# Patient Record
Sex: Female | Born: 1935
Health system: Southern US, Community
[De-identification: ages and names within clinical notes are randomized; demographics above are authoritative.]

## PROBLEM LIST (undated history)

## (undated) DIAGNOSIS — T8859XA Other complications of anesthesia, initial encounter: Secondary | ICD-10-CM

## (undated) DIAGNOSIS — D751 Secondary polycythemia: Secondary | ICD-10-CM

## (undated) DIAGNOSIS — E039 Hypothyroidism, unspecified: Secondary | ICD-10-CM

## (undated) DIAGNOSIS — F0390 Unspecified dementia without behavioral disturbance: Secondary | ICD-10-CM

## (undated) HISTORY — PX: BREAST SURGERY: SHX581

## (undated) HISTORY — PX: ABDOMINAL HYSTERECTOMY: SHX81

## (undated) HISTORY — DX: Secondary polycythemia: D75.1

---

## 2016-04-12 DIAGNOSIS — D45 Polycythemia vera: Secondary | ICD-10-CM | POA: Diagnosis not present

## 2016-04-26 DIAGNOSIS — D45 Polycythemia vera: Secondary | ICD-10-CM | POA: Diagnosis not present

## 2016-05-10 DIAGNOSIS — D45 Polycythemia vera: Secondary | ICD-10-CM | POA: Diagnosis not present

## 2016-05-20 DIAGNOSIS — D469 Myelodysplastic syndrome, unspecified: Secondary | ICD-10-CM | POA: Diagnosis not present

## 2016-05-20 DIAGNOSIS — D45 Polycythemia vera: Secondary | ICD-10-CM | POA: Diagnosis not present

## 2016-06-03 DIAGNOSIS — D45 Polycythemia vera: Secondary | ICD-10-CM | POA: Diagnosis not present

## 2016-06-03 DIAGNOSIS — D469 Myelodysplastic syndrome, unspecified: Secondary | ICD-10-CM | POA: Diagnosis not present

## 2016-07-01 DIAGNOSIS — D469 Myelodysplastic syndrome, unspecified: Secondary | ICD-10-CM | POA: Diagnosis not present

## 2016-07-01 DIAGNOSIS — D631 Anemia in chronic kidney disease: Secondary | ICD-10-CM | POA: Diagnosis not present

## 2016-07-01 DIAGNOSIS — M810 Age-related osteoporosis without current pathological fracture: Secondary | ICD-10-CM | POA: Diagnosis not present

## 2016-07-01 DIAGNOSIS — D509 Iron deficiency anemia, unspecified: Secondary | ICD-10-CM | POA: Diagnosis not present

## 2016-07-01 DIAGNOSIS — D471 Chronic myeloproliferative disease: Secondary | ICD-10-CM | POA: Diagnosis not present

## 2016-07-01 DIAGNOSIS — D709 Neutropenia, unspecified: Secondary | ICD-10-CM | POA: Diagnosis not present

## 2016-07-01 DIAGNOSIS — R4182 Altered mental status, unspecified: Secondary | ICD-10-CM | POA: Diagnosis not present

## 2016-07-01 DIAGNOSIS — D45 Polycythemia vera: Secondary | ICD-10-CM | POA: Diagnosis not present

## 2016-07-01 DIAGNOSIS — E860 Dehydration: Secondary | ICD-10-CM | POA: Diagnosis not present

## 2016-07-30 DIAGNOSIS — D45 Polycythemia vera: Secondary | ICD-10-CM | POA: Diagnosis not present

## 2016-08-30 DIAGNOSIS — D0462 Carcinoma in situ of skin of left upper limb, including shoulder: Secondary | ICD-10-CM | POA: Diagnosis not present

## 2016-08-30 DIAGNOSIS — D485 Neoplasm of uncertain behavior of skin: Secondary | ICD-10-CM | POA: Diagnosis not present

## 2016-08-30 DIAGNOSIS — L570 Actinic keratosis: Secondary | ICD-10-CM | POA: Diagnosis not present

## 2016-08-30 DIAGNOSIS — D0439 Carcinoma in situ of skin of other parts of face: Secondary | ICD-10-CM | POA: Diagnosis not present

## 2016-08-30 DIAGNOSIS — L578 Other skin changes due to chronic exposure to nonionizing radiation: Secondary | ICD-10-CM | POA: Diagnosis not present

## 2016-08-30 DIAGNOSIS — Z08 Encounter for follow-up examination after completed treatment for malignant neoplasm: Secondary | ICD-10-CM | POA: Diagnosis not present

## 2016-08-30 DIAGNOSIS — Z85828 Personal history of other malignant neoplasm of skin: Secondary | ICD-10-CM | POA: Diagnosis not present

## 2016-09-02 DIAGNOSIS — D45 Polycythemia vera: Secondary | ICD-10-CM | POA: Diagnosis not present

## 2016-10-07 DIAGNOSIS — D45 Polycythemia vera: Secondary | ICD-10-CM | POA: Diagnosis not present

## 2016-11-05 DIAGNOSIS — L570 Actinic keratosis: Secondary | ICD-10-CM | POA: Diagnosis not present

## 2016-11-05 DIAGNOSIS — D0439 Carcinoma in situ of skin of other parts of face: Secondary | ICD-10-CM | POA: Diagnosis not present

## 2016-11-05 DIAGNOSIS — D485 Neoplasm of uncertain behavior of skin: Secondary | ICD-10-CM | POA: Diagnosis not present

## 2016-11-05 DIAGNOSIS — D0462 Carcinoma in situ of skin of left upper limb, including shoulder: Secondary | ICD-10-CM | POA: Diagnosis not present

## 2016-12-23 DIAGNOSIS — D471 Chronic myeloproliferative disease: Secondary | ICD-10-CM | POA: Diagnosis not present

## 2016-12-23 DIAGNOSIS — D45 Polycythemia vera: Secondary | ICD-10-CM | POA: Diagnosis not present

## 2016-12-26 DIAGNOSIS — D45 Polycythemia vera: Secondary | ICD-10-CM | POA: Diagnosis not present

## 2017-01-09 DIAGNOSIS — H903 Sensorineural hearing loss, bilateral: Secondary | ICD-10-CM | POA: Diagnosis not present

## 2017-02-06 DIAGNOSIS — D45 Polycythemia vera: Secondary | ICD-10-CM | POA: Diagnosis not present

## 2017-05-20 ENCOUNTER — Telehealth: Payer: Self-pay | Admitting: Unknown Physician Specialty

## 2017-05-20 NOTE — Telephone Encounter (Signed)
I'm OK with this 

## 2017-05-20 NOTE — Telephone Encounter (Signed)
Laura [facility], please see other messages. I see we have some 30 minute openings this coming Monday afternoon, 05/26/17. But that would give [place] 3 new patients that day. Are you OK with that or do you see a day or time that we can fit the patient in?

## 2017-05-20 NOTE — Telephone Encounter (Signed)
Copied from [place]. Topic: Inquiry >> May 20, 2017  1:08 PM Neva Seat wrote: New Pt visit w/ Kathrine Haddock - 06-02-17   Pt is taking Agrylin (?) for a blood condition and will be running out of Rx before the New Pt visit.   Pt's Daughter Arrie Aran) is currently a pt of [facility].  She is requesting if possible, a specialist to be recommended and referred for pt to receive the above Rx.    Please call daughter at [phone] to discuss

## 2017-05-20 NOTE — Telephone Encounter (Signed)
Copied from [place]. >> May 20, 2017  1:08 PM Neva Seat wrote: New Pt visit w/ Kathrine Haddock - 06-02-17   Pt is taking Agrylin (?) for a blood condition and will be running out of Rx before the New Pt visit.   Pt's Daughter Arrie Aran) is currently a pt of [facility].  She is requesting if possible, a specialist to be recommended and referred for pt to receive the above Rx.    Please call daughter at [phone] to discuss

## 2017-05-21 NOTE — Telephone Encounter (Signed)
Called pt but VM was not set up.

## 2017-06-02 ENCOUNTER — Encounter: Payer: Self-pay | Admitting: Unknown Physician Specialty

## 2017-06-02 ENCOUNTER — Telehealth: Payer: Self-pay

## 2017-06-02 ENCOUNTER — Ambulatory Visit (INDEPENDENT_AMBULATORY_CARE_PROVIDER_SITE_OTHER): Payer: Medicare Other | Admitting: Unknown Physician Specialty

## 2017-06-02 VITALS — BP 86/65 | HR 105 | Temp 98.2°F | Ht 63.2 in | Wt 112.0 lb

## 2017-06-02 DIAGNOSIS — D45 Polycythemia vera: Secondary | ICD-10-CM

## 2017-06-02 DIAGNOSIS — R4181 Age-related cognitive decline: Secondary | ICD-10-CM | POA: Insufficient documentation

## 2017-06-02 DIAGNOSIS — R4189 Other symptoms and signs involving cognitive functions and awareness: Secondary | ICD-10-CM | POA: Insufficient documentation

## 2017-06-02 DIAGNOSIS — L509 Urticaria, unspecified: Secondary | ICD-10-CM | POA: Insufficient documentation

## 2017-06-02 DIAGNOSIS — I959 Hypotension, unspecified: Secondary | ICD-10-CM | POA: Diagnosis not present

## 2017-06-02 DIAGNOSIS — R636 Underweight: Secondary | ICD-10-CM | POA: Diagnosis not present

## 2017-06-02 DIAGNOSIS — Z7189 Other specified counseling: Secondary | ICD-10-CM

## 2017-06-02 DIAGNOSIS — E440 Moderate protein-calorie malnutrition: Secondary | ICD-10-CM | POA: Insufficient documentation

## 2017-06-02 DIAGNOSIS — G3184 Mild cognitive impairment, so stated: Secondary | ICD-10-CM | POA: Diagnosis not present

## 2017-06-02 DIAGNOSIS — Z7689 Persons encountering health services in other specified circumstances: Secondary | ICD-10-CM | POA: Diagnosis not present

## 2017-06-02 HISTORY — DX: Urticaria, unspecified: L50.9

## 2017-06-02 MED ORDER — ANAGRELIDE HCL 0.5 MG PO CAPS
ORAL_CAPSULE | ORAL | 0 refills | Status: DC
Start: 2017-06-02 — End: 2017-07-09

## 2017-06-02 MED ORDER — TRIAMCINOLONE ACETONIDE 0.1 % EX CREA: 1.0000 "application " | TOPICAL_CREAM | Freq: Two times a day (BID) | CUTANEOUS | 1 refills | Status: DC

## 2017-06-02 MED ORDER — ANAGRELIDE HCL 0.5 MG PO CAPS: ORAL_CAPSULE | ORAL | 0 refills | Status: DC

## 2017-06-02 NOTE — Assessment & Plan Note (Signed)
Seems to be improving.  Will follow next visit.  Cognitive testing at [facility]

## 2017-06-02 NOTE — Assessment & Plan Note (Signed)
From a side effect of her medication. Will recommend Aquaphor or Eucerin.  Use a steroid cream for very occasional use.

## 2017-06-02 NOTE — Telephone Encounter (Signed)
Pharmacy sent a fax needing the directions on patient's anagrelide changed as patient only take the medication Monday through Saturday. Current directions say Monday through Sunday. I typed it in wrong when I entered it into the patient;s chart. I tried to update it in the chart but both sets of directions are showing up. Can you change it to only take Monday through Saturday and resend to the pharmacy please?

## 2017-06-02 NOTE — Assessment & Plan Note (Addendum)
Refilled medication and made referral to hematology.  Will let them draw labs.

## 2017-06-02 NOTE — Assessment & Plan Note (Signed)
A voluntary discussion about advance care planning including the explanation and discussion of advance directives was extensively discussed  with the patient.  Explanation about the health care proxy and Living will was reviewed and packet with forms with explanation of how to fill them out was given.  During this discussion, the patient was able to identify a health care proxy plans to fill out the paperwork required.  Patient was offered a separate [place] visit for further assistance with forms.  Time spent:  20      Individuals present: daughter and patient.

## 2017-06-02 NOTE — Progress Notes (Signed)
BP (!) 86/65   Pulse (!) 105   Temp 98.2 F (36.8 C) (Oral)   Ht 5' 3.2" (1.605 m)   Wt 112 lb (50.8 kg)   SpO2 94%   BMI 19.71 kg/m    Subjective:    Patient ID: Laura [facility], female    DOB: May 05, 1926, 84 y.o.   MRN: [id number]  HPI: Laura Wilcox is a 84 y.o. female  Chief Complaint  Patient presents with  . Establish Care    pt's daughter states that the patient needs a referral to a hematologist or oncologist    She is living independently in her own apartment.  Here with her daughter who gives part of the history.    History is significant for polycythemia vera.  She is taking Anagrelide and was under the care of a hematologist.  She did have a health crisis in 2017 when this medication got changed.    Social History   Socioeconomic History  . Marital status: Widowed    Spouse name: Not on file  . Number of children: Not on file  . Years of education: Not on file  . Highest education level: Not on file  Social Needs  . Financial resource strain: Not on file  . Food insecurity - worry: Not on file  . Food insecurity - inability: Not on file  . Transportation needs - medical: Not on file  . Transportation needs - non-medical: Not on file  Occupational History  . Not on file  Tobacco Use  . Smoking status: Former Smoker    Last attempt to quit: 04/02/1978    Years since quitting: 39.1  . Smokeless tobacco: Never Used  Substance and Sexual Activity  . Alcohol use: No    Frequency: Never    Comment: occasional glass of wine  . Drug use: No  . Sexual activity: Not on file  Other Topics Concern  . Not on file  Social History Narrative  . Not on file   Family History  Problem Relation Age of Onset  . Heart disease Father   . Cancer Sister        breast  . Cancer Sister        breast  . Alzheimer's disease Sister    Past Medical History:  Diagnosis Date  . Polycythemia    Past Surgical History:  Procedure Laterality Date  . ABDOMINAL  HYSTERECTOMY     complete     Relevant past medical, surgical, family and social history reviewed and updated as indicated. Interim medical history since our last visit reviewed. Allergies and medications reviewed and updated.  Review of Systems  Constitutional: Negative.  Negative for unexpected weight change.  HENT: Negative.   Respiratory: Negative.   Cardiovascular: Negative.   Gastrointestinal: Negative.   Genitourinary: Positive for frequency.       Has to wear a pad much of the time  Skin: Negative for rash.       Dry and scaley.  Sde effect of medications and solar damage  Neurological:       Short term memory loss following health crisis in 2017.  Slowly improving  Hematological:       Polycythemia vera  Psychiatric/Behavioral: Negative.     Per HPI unless specifically indicated above     Objective:    BP (!) 86/65   Pulse (!) 105   Temp 98.2 F (36.8 C) (Oral)   Ht 5' 3.2" (1.605 m)   Wt 112 lb (  50.8 kg)   SpO2 94%   BMI 19.71 kg/m   Wt Readings from Last 3 Encounters:  06/02/17 112 lb (50.8 kg)    Physical Exam  Constitutional: She is oriented to person, place, and time. She appears well-developed and well-nourished. No distress.  HENT:  Head: Normocephalic and atraumatic.  Eyes: Conjunctivae and lids are normal. Right eye exhibits no discharge. Left eye exhibits no discharge. No scleral icterus.  Neck: Normal range of motion. Neck supple. No JVD present. Carotid bruit is not present.  Cardiovascular: Normal rate, regular rhythm and normal heart sounds.  Pulmonary/Chest: Effort normal and breath sounds normal.  Abdominal: Normal appearance. There is no splenomegaly or hepatomegaly.  Musculoskeletal: Normal range of motion.  Neurological: She is alert and oriented to person, place, and time.  Skin: Skin is warm, dry and intact. No rash noted. No pallor.  Psychiatric: She has a normal mood and affect. Her behavior is normal. Judgment and thought content  normal.    No results found for this or any previous visit.    Assessment & Plan:   Problem List Items Addressed This Visit      Unprioritized   Advanced care planning/counseling discussion    A voluntary discussion about advance care planning including the explanation and discussion of advance directives was extensively discussed  with the patient.  Explanation about the health care proxy and Living will was reviewed and packet with forms with explanation of how to fill them out was given.  During this discussion, the patient was able to identify a health care proxy plans to fill out the paperwork required.  Patient was offered a separate [place] visit for further assistance with forms.  Time spent:  20      Individuals present: daughter and patient.       Mild cognitive impairment    Seems to be improving.  Will follow next visit.  Cognitive testing at [facility]      Polycythemia vera ([place]) - Primary    Refilled medication and made referral to hematology.  Will let them draw labs.        Relevant Orders   Ambulatory referral to Hematology / Oncology   Underweight    Weight is stable.  She has always been thin      Urticaria    From a side effect of her medication. Will recommend Aquaphor or Eucerin.  Use a steroid cream for very occasional use.         Other Visit Diagnoses    Hypotension, unspecified hypotension type       Follow.  Normal for her   Encounter to establish care          Refusing bone density  Follow up plan: Return in about 6 months (around 12/03/2017) for wellness and physical.

## 2017-06-02 NOTE — Patient Instructions (Signed)
Creams or ointments: Udder cream, Aquaphor, Eucerin cream

## 2017-06-02 NOTE — Assessment & Plan Note (Signed)
Weight is stable.  She has always been thin

## 2017-06-06 ENCOUNTER — Telehealth: Payer: Self-pay

## 2017-06-06 NOTE — Telephone Encounter (Signed)
Called and spoke to [place] at the cancer center. She states that they will need patient's previous lab results to compare results to. Will call patient's daughter and see what we can do about getting records.

## 2017-06-06 NOTE — Telephone Encounter (Signed)
Called and spoke to patient's daughter. Explained to her what was going on. She states that they have paper copies of the patient's records at home and can bring them by here for [place] to look at. Will await previous records and fax requested information to the cancer center.

## 2017-06-06 NOTE — Telephone Encounter (Signed)
Patient just established care with [facility]. We do not have any labs on her. If they need [place] to, we can check where she was seen previously and try to get records, but I don't have anything for them right now.

## 2017-06-06 NOTE — Telephone Encounter (Signed)
Previous records dropped off by patient's family. Looked through records and faxed lab results to the [facility].

## 2017-06-06 NOTE — Telephone Encounter (Signed)
Colette called from cancer center. They need labs before scheduling patient. Want to know how polycythemia vera was d/x. Please advise.   Fax number for results: [phone]

## 2017-07-02 ENCOUNTER — Inpatient Hospital Stay: Payer: Medicare Other

## 2017-07-02 ENCOUNTER — Inpatient Hospital Stay: Payer: Medicare Other | Attending: Hematology and Oncology | Admitting: Hematology and Oncology

## 2017-07-02 ENCOUNTER — Encounter: Payer: Self-pay | Admitting: Hematology and Oncology

## 2017-07-02 VITALS — BP 122/56 | HR 60 | Temp 97.3°F | Resp 18 | Ht 65.0 in | Wt 110.0 lb

## 2017-07-02 DIAGNOSIS — D7581 Myelofibrosis: Secondary | ICD-10-CM | POA: Diagnosis not present

## 2017-07-02 DIAGNOSIS — D45 Polycythemia vera: Secondary | ICD-10-CM | POA: Insufficient documentation

## 2017-07-02 DIAGNOSIS — Z87891 Personal history of nicotine dependence: Secondary | ICD-10-CM | POA: Insufficient documentation

## 2017-07-02 DIAGNOSIS — R42 Dizziness and giddiness: Secondary | ICD-10-CM | POA: Diagnosis not present

## 2017-07-02 DIAGNOSIS — D473 Essential (hemorrhagic) thrombocythemia: Secondary | ICD-10-CM

## 2017-07-02 DIAGNOSIS — C946 Myelodysplastic disease, not classified: Secondary | ICD-10-CM | POA: Diagnosis not present

## 2017-07-02 LAB — CBC WITH DIFFERENTIAL/PLATELET
Basophils Absolute: 0.3 10*3/uL — ABNORMAL HIGH (ref 0–0.1)
Basophils Relative: 2 %
Eosinophils Absolute: 1.1 10*3/uL — ABNORMAL HIGH (ref 0–0.7)
Eosinophils Relative: 6 %
HCT: 46.9 % (ref 35.0–47.0)
Hemoglobin: 15.2 g/dL (ref 12.0–16.0)
LYMPHS PCT: 11 %
Lymphs Abs: 2.1 10*3/uL (ref 1.0–3.6)
MCH: 27.1 pg (ref 26.0–34.0)
MCHC: 32.4 g/dL (ref 32.0–36.0)
MCV: 83.6 fL (ref 80.0–100.0)
MONO ABS: 1 10*3/uL — AB (ref 0.2–0.9)
MONOS PCT: 5 %
NEUTROS ABS: 14.4 10*3/uL — AB (ref 1.4–6.5)
Neutrophils Relative %: 76 %
Platelets: 645 10*3/uL — ABNORMAL HIGH (ref 150–440)
RBC: 5.61 MIL/uL — ABNORMAL HIGH (ref 3.80–5.20)
RDW: 25.2 % — AB (ref 11.5–14.5)
WBC: 18.8 10*3/uL — ABNORMAL HIGH (ref 3.6–11.0)

## 2017-07-02 LAB — COMPREHENSIVE METABOLIC PANEL
ALT: 11 U/L — ABNORMAL LOW (ref 14–54)
ANION GAP: 9 (ref 5–15)
AST: 17 U/L (ref 15–41)
Albumin: 4.2 g/dL (ref 3.5–5.0)
Alkaline Phosphatase: 111 U/L (ref 38–126)
BILIRUBIN TOTAL: 0.4 mg/dL (ref 0.3–1.2)
BUN: 23 mg/dL — ABNORMAL HIGH (ref 6–20)
CO2: 28 mmol/L (ref 22–32)
Calcium: 9.3 mg/dL (ref 8.9–10.3)
Chloride: 101 mmol/L (ref 101–111)
Creatinine, Ser: 0.88 mg/dL (ref 0.44–1.00)
GFR calc Af Amer: >60 mL/min (ref >60)
GFR, EST NON AFRICAN AMERICAN: 57 mL/min — AB (ref >60)
Glucose, Bld: 91 mg/dL (ref 65–99)
POTASSIUM: 4.3 mmol/L (ref 3.5–5.1)
Sodium: 138 mmol/L (ref 135–145)
TOTAL PROTEIN: 7.3 g/dL (ref 6.5–8.1)

## 2017-07-02 NOTE — Progress Notes (Signed)
[facility] day:  07/02/2017  Chief Complaint: Laura Wilcox is a 84 y.o. female with polycythemia who is referred in consultation by Kathrine Haddock, NP for assessment and management.  HPI:  She has a history of polycythemia rubra vera.  She takes anagrelide.  She had a health crisis in 2017 when this medication was changed.  She previously received care at [facility] in [place], [place] [phone]).  Her oncologist was Dr Harley Hallmark.  Review of records from [place]: The patient presented with thrombocytosis on 04/12/2002.  CBC revealed a hematocrit of 39.8, hemoglobin 13.8, MCV 86, platelets 835,000, WBC 12,700 with a normal differential.  Bone marrow was on 04/15/2002 revealed a normocellular marrow for age with trilineage hematopoiesis and mild megakaryocytic hyperplasia with focal atypia.  There was no morphologic evidence of a myeloproliferative disorder.  Iron stains were inadequate for evaluation of storage iron.  There was no increase in reticulin fibers.  She has been on agrylin since 2012.  JAK2 V617F was positive on 05/20/2012.  Bone marrow on 09/25/2012 revealed persistent myeloproliferative disorder s/p Agrylin.  The current features were best classified as persistent myeloproliferative neoplasm (MPN) JAK2 V617F mutation positive.  The current features of markedly increased atypical megakaryocytes paresis, with frequent clustering with frequent hyper/deeply lobulated forms, bizarre forms and forms with staghorn nuclei, hyperchromatic forms and abundant cytoplasm was highly suggestive of a myeloproliferative neoplasm.  Marrow was hypercellular for age (30-50%) with increased trilineage hematopoiesis and markedly increased atypical megakaryopoiesis with frequent clustering.  There were no increased blasts.  Iron stores were absent.  There was moderate to severe myelofibrosis (grade MF 2-3).  Differential includes ET, PV, primary  myelofibrosis, and MPN, unclassifiable.  Cytogenetics were normal (65, XX).  CBC on 12/01/2015 revealed a hematocrit of 44.7, hemoglobin 13.5, MCV 87.2, platelets 599,000, WBC 20,600.  She was on Agrylin 3 tablets MWF and 2 tablets the other days.  She may have forgotten to take her medications.  Agrylin was increased to 4 tablets daily.  CBC on 12/29/2015 revealed a hematocrit of 29.4, hemoglobin 8.8, platelets 15,000, WBC 700 with an ANC of 200.  Notes indicate she stopped Hydrea 4 days prior and began anagrelide one day prior.  She began Procrit weekly an Neupogen weekly.  Counts gradually improved with platelets > 100,000 on 01/10/2016.  CBC revealed a hematocrit of 27.8, hemoglobin 8.3, MCV 84.9, platelets 168,000, WBC 43,500 with an ANC of 25,900.  She restarted Agrylin QOD on 01/17/2016.  She received Procrit 40,000 units (intermittently 09/2012 - 06/2016), Venofer (09/2012 - 10/2012), and Infed (12/2015 - 03/2016).  Over the past year, hematocrit has ranged 37.5 - 42.4, hemoglobin 12.1 - 13.4, platelets 458,000 - 569,000, WBC 10,500 - 15,700. Creatinine has been 1.0 - 1.2.  Last CBC in [place] on 12/23/2016 revealed a hematocrit of 42.4, hemoglobin 13.4, MCV 90.1, platelets 569,000, WBC 16,700 with an ANC of 11,000.  Creatinine was 1.1.  Patient moved next door to her daughter in 2018.  Daughter notes that the patient lost a 5 month period of her memory. When she moved, her memory "started to return some", however she notes that the patient's memory is "still not normal". Daughter notes that patient was taken off of her medication for her PV for over 6 months.   Patient complains of chronic fatigue. Patient  is napping in the morning and afternoon (45-60 minutes). Patient sleeps 8-10 hours every night. Patient tries to remain active. Her daughter  takes her out on a daily basis. She is able to independently perform all of her activities of daily living.   Patient denies fevers, sweats, weight  loss. Her weight is at baseline. Patient denies neuropathy. She describes an episodes of her LEFT index finger becoming numb and turning "ghostly white" for about 2-3 hours. Patient has been on a stable dose of anagrelide  0.5 mg x 6 days a week (Sundays off).    Past Medical History:  Diagnosis Date  . Polycythemia     Past Surgical History:  Procedure Laterality Date  . ABDOMINAL HYSTERECTOMY     complete    Family History  Problem Relation Age of Onset  . Heart disease Father   . Cancer Sister        breast  . Cancer Sister        breast  . Alzheimer's disease Sister     Social History:  reports that she quit smoking about 39 years ago. She has a 30.00 pack-year smoking history. She has never used smokeless tobacco. She reports that she does not drink alcohol or use drugs.  Former smoker (1/2 pack/day) from age 37s to 50s. She quit smoking in 1980.  She occasionally drinks a glass of wine.  She is widowed.  Patient is retired Sport and exercise psychologist. Patient moved to [facility] in 03/2017.  She lives independently in her own apartment in [place]. Her daughter lives next door.  Patient denies known exposures to radiation on toxins. The patient is accompanied by her daughter today.  Allergies: No Known Allergies  Current Medications: Current Outpatient Medications  Medication Sig Dispense Refill  . anagrelide (AGRYLIN) 0.5 MG capsule Take one tablet daily on Monday through Saturday, do not take on Sunday 30 capsule 0  . triamcinolone cream (KENALOG) 0.1 % Apply 1 application topically 2 (two) times daily. (Patient not taking: Reported on 07/02/2017) 30 g 1   No current facility-administered medications for this visit.     Review of Systems:  GENERAL:  Feels great.  Fatigue.  Takes naps (2 hours/day).  No fevers, sweats or weight loss. PERFORMANCE STATUS (ECOG):  1-2 HEENT:  No visual changes, runny nose, sore throat, mouth sores or tenderness. Lungs: No shortness of breath or cough.  No  hemoptysis. Cardiac:  No chest pain, palpitations, orthopnea, or PND. GI:  No nausea, vomiting, diarrhea, constipation, melena or hematochezia. GU:  No urgency, frequency, dysuria, or hematuria. Musculoskeletal:  No back pain.  No joint pain.  No muscle tenderness. Extremities:  No pain or swelling. Skin:  No rashes or skin changes. Neuro:  No headache, numbness or weakness, balance or coordination issues. Endocrine:  No diabetes, thyroid issues, hot flashes or night sweats. Psych:  No mood changes, depression or anxiety. Pain:  No focal pain. Review of systems:  All other systems reviewed and found to be negative.  Physical Exam: Blood pressure (!) 122/56, pulse 60, temperature (!) 97.3 F (36.3 C), temperature source Tympanic, resp. rate 18, height 5\' 5"  (1.651 m), weight 110 lb 0.2 oz (49.9 kg). GENERAL:  Thin elderly woman sitting comfortably in the exam room in no acute distress. MENTAL STATUS:  Alert and oriented to person, place and time. HEAD:  Short dark blonde hair.  Normocephalic, atraumatic, face symmetric, no Cushingoid features. EYES:  Blue eyes.  Pupils equal round and reactive to light and accomodation.  No conjunctivitis or scleral icterus. ENT:  Oropharynx clear without lesion.  Tongue normal. Mucous membranes moist.  RESPIRATORY:  Clear  to auscultation without rales, wheezes or rhonchi. CARDIOVASCULAR:  Regular rate and rhythm without murmur, rub or gallop. ABDOMEN:  Soft, non-tender, with active bowel sounds, and no hepatosplenomegaly.  No masses. SKIN:  No rashes, ulcers or lesions. EXTREMITIES: No edema, no skin discoloration or tenderness.  No palpable cords. LYMPH NODES: No palpable cervical, supraclavicular, axillary or inguinal adenopathy  NEUROLOGICAL: Unremarkable. PSYCH:  Appropriate.   No visits with results within 3 Day(s) from this visit.  Latest known visit with results is:  No results found for any previous visit.    Assessment:  Laura Wilcox  is a 84 y.o. female with a myeloproliferative disorder.  She presented in 04/2002 with thrombocytosis (835,000).  JAK2 V617F was positive on 05/20/2012.  Over the past year, hematocrit has ranged 37.5 - 42.4, hemoglobin 12.1 - 13.4, platelets 458,000 - 569,000, WBC 10,500 - 15,700.  Creatinine has been 1.0 - 1.2.  Bone marrow  on 04/15/2002 revealed a normocellular marrow for age with trilineage hematopoiesis and mild megakaryocytic hyperplasia with focal atypia.  There was no morphologic evidence of a myeloproliferative disorder.  Iron stains were inadequate for evaluation of storage iron.  There was no increase in reticulin fibers.  She has been on agrylin since 2012.  Bone marrow on 09/25/2012 revealed a persistent myeloproliferative disorder s/p Agrylin.  The current features were best classified as persistent myeloproliferative neoplasm (MPN) JAK2 V617F mutation positive.  Marrow was hypercellular for age (30-50%) with increased trilineage hematopoiesis and markedly increased atypical megakaryopoiesis with frequent clustering.  There were no increased blasts.  Iron stores were absent.  There was moderate to severe myelofibrosis (grade MF 2-3).  Differential included ET, PV, primary myelofibrosis, and MPN, unclassifiable.  Cytogenetics were normal (17, XX).  She developed pancytopenia in 12/2015.  Etiology is unclear (medication confusion or hydroxyurea + Agrylin).  She received Procrit 40,000 units (intermittently 09/2012 - 06/2016), Venofer (09/2012 - 10/2012), and Infed (12/2015 - 03/2016).  She received Neupogen during a period of neutropenia.  Symptomatically, she feels great.  She takes naps.  She denies any B symptoms.  Exam reveals no adenopathy or hepatosplenomegaly.  Plan: 1.  Labs today: CBC with diff, CMP. 2.  Review differential diagnosis of polycythemia rubra vera, essential thrombocytosis, and possible myelofibrosis. Patient was treated by Dr. Harley Hallmark (67 + years) and Dr. Lubertha South (last oncologist). Will need to obtain and review charts from [place]. Discuss need to maintain a hematocrit < 42 and a platelet count of < 400,000 in patients with PV to prevent complications (thrombosis). Once charts reviewed, we will consider switching patient from anagrelide to hydroxyurea or Jakafi. 3.  Continue current dose of anagrelide. 4.  RTC in 1 week for MD assessment, review of workup, and further discussion regarding direction of therapy.    Honor Loh, NP  07/02/2017, 12:44 PM   I saw and evaluated the patient, participating in the key portions of the service and reviewing pertinent diagnostic studies and records.  I reviewed the nurse practitioner's note and agree with the findings and the plan.  The assessment and plan were discussed with the patient and her daughter at length.  Extensive medical records were reviewed (153 pages).  Over 60 minutes were spent with direct patient contact and coordinating care.  Dr Nicoletta Dress, her primary oncologist will be contacted to discuss further.  Multiple questions were asked by the patient and answered.   Nolon Stalls, MD 07/02/2017, 12:44 PM

## 2017-07-02 NOTE — Progress Notes (Signed)
Patient here today as new evaluation regarding polycythemia vera.  Referred by Dr. Kathrine Haddock.  Patient id extremely hard of hearing.   Patient has had PV since 2017/  Patient accompanied by her daughter today.  Daughter is unsure if patient had been following doctor's orders with her medication because of MD calling and increasing her medication each week.  Daughter unsure what dosage she was taking.

## 2017-07-06 ENCOUNTER — Encounter: Payer: Self-pay | Admitting: Hematology and Oncology

## 2017-07-09 ENCOUNTER — Inpatient Hospital Stay (HOSPITAL_BASED_OUTPATIENT_CLINIC_OR_DEPARTMENT_OTHER): Payer: Medicare Other | Admitting: Hematology and Oncology

## 2017-07-09 ENCOUNTER — Encounter (INDEPENDENT_AMBULATORY_CARE_PROVIDER_SITE_OTHER): Payer: Self-pay

## 2017-07-09 ENCOUNTER — Encounter: Payer: Self-pay | Admitting: Hematology and Oncology

## 2017-07-09 VITALS — BP 148/83 | HR 86 | Temp 98.4°F | Resp 18 | Wt 110.9 lb

## 2017-07-09 DIAGNOSIS — Z87891 Personal history of nicotine dependence: Secondary | ICD-10-CM

## 2017-07-09 DIAGNOSIS — D473 Essential (hemorrhagic) thrombocythemia: Secondary | ICD-10-CM

## 2017-07-09 DIAGNOSIS — D45 Polycythemia vera: Secondary | ICD-10-CM | POA: Diagnosis not present

## 2017-07-09 DIAGNOSIS — Z7189 Other specified counseling: Secondary | ICD-10-CM

## 2017-07-09 DIAGNOSIS — R42 Dizziness and giddiness: Secondary | ICD-10-CM

## 2017-07-09 DIAGNOSIS — D7581 Myelofibrosis: Secondary | ICD-10-CM | POA: Diagnosis not present

## 2017-07-09 DIAGNOSIS — C946 Myelodysplastic disease, not classified: Secondary | ICD-10-CM | POA: Diagnosis not present

## 2017-07-09 MED ORDER — ANAGRELIDE HCL 0.5 MG PO CAPS: ORAL_CAPSULE | ORAL | 0 refills | Status: DC

## 2017-07-09 NOTE — Progress Notes (Signed)
[facility] day:  07/09/2017  Chief Complaint: Laura Wilcox is a 84 y.o. female with a myeloproliferative disorder who is seen for 1 week assessment.  HPI:  The patient was last seen in the hematology clinic on 07/02/2017 for new patient assessment.  She had previously lived in [place].  She was on anagrelide.  She felt great.  Labs included a hematocrit of 46.5, hemoglobin 15.2, MCV 83.6, platelets 645,000, WBC 18,800 with an ANC of 14,400.  During the interim, she has felt the same.  She describes being dizzy this morning.  She felt tired and sleepy.  She denies any current complaints.   Past Medical History:  Diagnosis Date  . Polycythemia     Past Surgical History:  Procedure Laterality Date  . ABDOMINAL HYSTERECTOMY     complete    Family History  Problem Relation Age of Onset  . Heart disease Father   . Cancer Sister        breast  . Cancer Sister        breast  . Alzheimer's disease Sister     Social History:  reports that she quit smoking about 39 years ago. She has a 30.00 pack-year smoking history. She has never used smokeless tobacco. She reports that she does not drink alcohol or use drugs.  Former smoker (1/2 pack/day) from age 75s to 75s. She quit smoking in 1980.  She occasionally drinks a glass of wine.  She is widowed.  Patient is retired Sport and exercise psychologist. Patient moved to [facility] in 03/2017.  She lives independently in her own apartment in [place]. Her daughter lives next door.  Patient denies known exposures to radiation on toxins. The patient is accompanied by her daughter today.  Allergies: No Known Allergies  Current Medications: Current Outpatient Medications  Medication Sig Dispense Refill  . anagrelide (AGRYLIN) 0.5 MG capsule Take one tablet daily on Monday through Saturday, do not take on Sunday 30 capsule 0  . triamcinolone cream (KENALOG) 0.1 % Apply 1 application topically 2 (two) times daily. 30 g 1   No current  facility-administered medications for this visit.     Review of Systems:  GENERAL:  Feels good.  Fatigue. Takes naps (2 hours/day).  No fevers, sweats or weight loss. PERFORMANCE STATUS (ECOG):  1-2 HEENT:  No visual changes, runny nose, sore throat, mouth sores or tenderness. Lungs: No shortness of breath or cough.  No hemoptysis. Cardiac:  No chest pain, palpitations, orthopnea, or PND. GI:  No nausea, vomiting, diarrhea, constipation, melena or hematochezia. GU:  No urgency, frequency, dysuria, or hematuria. Musculoskeletal:  No back pain.  No joint pain.  No muscle tenderness. Extremities:  No pain or swelling. Skin:  No rashes or skin changes. Neuro:  Dizzy this morning (resolved).  No headache, numbness or weakness, balance or coordination issues. Endocrine:  No diabetes, thyroid issues, hot flashes or night sweats. Psych:  No mood changes, depression or anxiety. Pain:  No focal pain. Review of systems:  All other systems reviewed and found to be negative.   Physical Exam: Blood pressure (!) 148/83, pulse 86, temperature 98.4 F (36.9 C), temperature source Tympanic, resp. rate 18, weight 110 lb 14.3 oz (50.3 kg), SpO2 96 %. GENERAL:  Thin elderly woman sitting comfortably in the exam room in no acute distress. MENTAL STATUS:  Alert and oriented to person, place and time. HEAD:  Short dark blonde hair.  Normocephalic, atraumatic, face symmetric, no Cushingoid features. EYES:  Blue eyes.  No conjunctivitis or scleral icterus. NEUROLOGICAL: Unremarkable. PSYCH:  Appropriate.    No visits with results within 3 Day(s) from this visit.  Latest known visit with results is:  Appointment on 07/02/2017  Component Date Value Ref Range Status  . WBC 07/02/2017 18.8* 3.6 - 11.0 K/uL Final  . RBC 07/02/2017 5.61* 3.80 - 5.20 MIL/uL Final  . Hemoglobin 07/02/2017 15.2  12.0 - 16.0 g/dL Final  . HCT 07/02/2017 46.9  35.0 - 47.0 % Final  . MCV 07/02/2017 83.6  80.0 - 100.0 fL Final  .  MCH 07/02/2017 27.1  26.0 - 34.0 pg Final  . MCHC 07/02/2017 32.4  32.0 - 36.0 g/dL Final  . RDW 07/02/2017 25.2* 11.5 - 14.5 % Final  . Platelets 07/02/2017 645* 150 - 440 K/uL Final  . Neutrophils Relative % 07/02/2017 76  % Final  . Neutro Abs 07/02/2017 14.4* 1.4 - 6.5 K/uL Final  . Lymphocytes Relative 07/02/2017 11  % Final  . Lymphs Abs 07/02/2017 2.1  1.0 - 3.6 K/uL Final  . Monocytes Relative 07/02/2017 5  % Final  . Monocytes Absolute 07/02/2017 1.0* 0.2 - 0.9 K/uL Final  . Eosinophils Relative 07/02/2017 6  % Final  . Eosinophils Absolute 07/02/2017 1.1* 0 - 0.7 K/uL Final  . Basophils Relative 07/02/2017 2  % Final  . Basophils Absolute 07/02/2017 0.3* 0 - 0.1 K/uL Final   Performed at [facility], [address], [place], [place] [address]  . Sodium 07/02/2017 138  135 - 145 mmol/L Final  . Potassium 07/02/2017 4.3  3.5 - 5.1 mmol/L Final  . Chloride 07/02/2017 101  101 - 111 mmol/L Final  . CO2 07/02/2017 28  22 - 32 mmol/L Final  . Glucose, Bld 07/02/2017 91  65 - 99 mg/dL Final  . BUN 07/02/2017 23* 6 - 20 mg/dL Final  . Creatinine, Ser 07/02/2017 0.88  0.44 - 1.00 mg/dL Final  . Calcium 07/02/2017 9.3  8.9 - 10.3 mg/dL Final  . Total Protein 07/02/2017 7.3  6.5 - 8.1 g/dL Final  . Albumin 07/02/2017 4.2  3.5 - 5.0 g/dL Final  . AST 07/02/2017 17  15 - 41 U/L Final  . ALT 07/02/2017 11* 14 - 54 U/L Final  . Alkaline Phosphatase 07/02/2017 111  38 - 126 U/L Final  . Total Bilirubin 07/02/2017 0.4  0.3 - 1.2 mg/dL Final  . GFR calc non Af Amer 07/02/2017 57* >60 mL/min Final  . GFR calc Af Amer 07/02/2017 >60  >60 mL/min Final   Comment: (NOTE) The eGFR has been calculated using the CKD EPI equation. This calculation has not been validated in all clinical situations. eGFR's persistently <60 mL/min signify possible Chronic Kidney Disease.   Georgiann Hahn gap 07/02/2017 9  5 - 15 Final   Performed at [facility], [address]  [place], [place] [address]    Assessment:  Laura Wilcox is a 84 y.o. female with a myeloproliferative disorder.  She presented in 04/2002 with thrombocytosis (835,000).  JAK2 V617F was positive on 05/20/2012.  Over the past year, hematocrit has ranged 37.5 - 42.4, hemoglobin 12.1 - 13.4, platelets 458,000 - 569,000, WBC 10,500 - 15,700.  Creatinine has been 1.0 - 1.2.  Bone marrow  on 04/15/2002 revealed a normocellular marrow for age with trilineage hematopoiesis and mild megakaryocytic hyperplasia with focal atypia.  There was no morphologic evidence of a myeloproliferative disorder.  Iron stains were inadequate for evaluation of  storage iron.  There was no increase in reticulin fibers.  She has been on agrylin since 2012.  Bone marrow on 09/25/2012 revealed a persistent myeloproliferative disorder s/p Agrylin.  The current features were best classified as persistent myeloproliferative neoplasm (MPN) JAK2 V617F mutation positive.  Marrow was hypercellular for age (30-50%) with increased trilineage hematopoiesis and markedly increased atypical megakaryopoiesis with frequent clustering.  There were no increased blasts.  Iron stores were absent.  There was moderate to severe myelofibrosis (grade MF 2-3).  Differential included ET, PV, primary myelofibrosis, and MPN, unclassifiable.  Cytogenetics were normal (74, XX).  She developed pancytopenia in 12/2015.  Etiology is unclear (medication confusion or hydroxyurea + Agrylin).  She received Procrit 40,000 units (intermittently 09/2012 - 06/2016), Venofer (09/2012 - 10/2012), and Infed (12/2015 - 03/2016).  She received Neupogen during a period of neutropenia.  Symptomatically, she denies any complaints.  She denies any B symptoms.  Exam is stable.  Plan: 1.  Review labs from last week. 2.  Review summary of outside records. 3.  Phone follow-up with Dr. Harley Hallmark and/or Dr. Lubertha South [phone]).  Spoke with Dr. Nicoletta Dress. 4.  Review diagnosis of  polycythemia rubra vera, essential thrombocytosis, and possible myelofibrosis. Patient was treated by Dr. Harley Hallmark (68 + years) and Dr. Lubertha South (last oncologist). Plan to maintain a hematocrit < 42 and a platelet count of < 400,000 in patients with PV to prevent complications (thrombosis).  We discussed consideration of switching patient from anagrelide to hydroxyurea or [place] after speaking with past oncologists in [place].  Patient considering. Initial review of records indicates use of hydroxyurea around time of low counts.  Spoke with Dr Nicoletta Dress.  Discuss fibrosis associated with anagrelide.  Discuss therapeutic phlebotomy program in the future.  5.  Continue current dose of anagrelide. 6.  RTC based on conversation with Dr Cathie Olden and drug [name]) approval. Will contact patient.    Lequita Asal, MD  07/09/2017, 4:35 PM

## 2017-07-09 NOTE — Progress Notes (Signed)
Patient offers no complaints today.  Patient is accompanied by her daughter today who states patient was dizzy this morning when she first got up.  States she c/o feeling tired and sleepy.

## 2017-07-09 NOTE — Patient Instructions (Signed)
Ruxolitinib oral tablets What is this medicine? RUXOLITINIB (RUX oh LI ti nib) is a medicine that targets proteins in cells and stops them from growing. It is used to treat myelofibrosis and polycythemia vera. This medicine may be used for other purposes; ask your health care provider or pharmacist if you have questions. COMMON BRAND NAME(S): Jakafi What should I tell my health care provider before I take this medicine? They need to know if you have any of these conditions: -cancer of the skin -hepatitis -immune system problems -infection (especially a virus infection such as chickenpox, cold sores, or herpes) -kidney disease -liver disease -low blood counts, like low white cell, platelet, or red cell counts -an unusual or allergic reaction to ruxolitinib, other medicines, foods, dyes, or preservatives -pregnant or trying to get pregnant -breast-feeding How should I use this medicine? Take this medicine by mouth with a glass of water. Do not take with grapefruit juice. Follow the directions on the prescription label. Take your medicine at regular intervals. Do not take it more often than directed. Do not stop taking except on your doctor's advice. Talk to your pediatrician regarding the use of this medicine in children. Special care may be needed. Overdosage: If you think you have taken too much of this medicine contact a poison control center or emergency room at once. NOTE: This medicine is only for you. Do not share this medicine with others. What if I miss a dose? If you miss a dose, take it as soon as you can. If it is almost time for your next dose, take only that dose. Do not take double or extra doses. What may interact with this medicine? -antiviral medicines for HIV or AIDS like delavirdine, indinavir, lopinavir; ritonavir, nelfinavir, ritonavir, saquinavir, tipranavir -antiviral medicines for Hepatitis C like boceprevir, telaprevir -certain antibiotics like clarithromycin,  dalfopristin; quinupristin, erythromycin, isoniazid, telithromycin, rifampin -certain medicines for fungal infection like itraconazole, ketoconazole, posaconazole, voriconazole -conivaptan -grapefruit juice -mibefradil -nefazodone This list may not describe all possible interactions. Give your health care provider a list of all the medicines, herbs, non-prescription drugs, or dietary supplements you use. Also tell them if you smoke, drink alcohol, or use illegal drugs. Some items may interact with your medicine. What should I watch for while using this medicine? You may need blood work done while you are taking this medicine. Call your doctor or health care professional for advice if you get a fever, chills or sore throat, or other symptoms of a cold or flu. Do not treat yourself. This drug decreases your body's ability to fight infections. Try to avoid being around people who are sick. This medicine may increase your risk to bruise or bleed. Call your doctor or health care professional if you notice any unusual bleeding. In some patients, this medicine may cause a serious brain infection that may cause death. If you have any problems seeing, thinking, speaking, walking, or standing, tell your doctor right away. If you cannot reach your doctor, urgently seek other source of medical care. Talk to your doctor about your risk of cancer. You may be more at risk for certain types of cancers if you take this medicine. Do not breast-feed an infant while taking this medicine or for at least 2 weeks after stopping it. What side effects may I notice from receiving this medicine? Side effects that you should report to your doctor or health care professional as soon as possible: -allergic reactions like skin rash, itching or hives, swelling of the face,  lips, or tongue -low blood counts - this medicine may decrease the number of white blood cells, red blood cells and platelets. You may be at increased risk for  infections and bleeding -signs of infection - fever or chills, cough, sore throat, pain or difficulty passing urine -signs of decreased platelets or bleeding - bruising, pinpoint red spots on the skin, black, tarry stools, blood in the urine -signs of decreased red blood cells - unusually weak or tired, feeling faint or lightheaded, falls Side effects that usually do not require medical attention (report to your doctor or health care professional if they continue or are bothersome): -dizziness -headache -upset stomach -weight gain This list may not describe all possible side effects. Call your doctor for medical advice about side effects. You may report side effects to FDA at 1-800-FDA-1088. Where should I keep my medicine? Keep out of the reach of children. Store between 20 and 25 degrees C (68 and 77 degrees F). Throw away any unused medicine after the expiration date. NOTE: This sheet is a summary. It may not cover all possible information. If you have questions about this medicine, talk to your doctor, pharmacist, or health care provider.  2018 Elsevier/Gold Standard (2016-01-12 11:13:08)

## 2017-08-01 ENCOUNTER — Telehealth: Payer: Self-pay | Admitting: *Deleted

## 2017-08-01 NOTE — Telephone Encounter (Signed)
Patient daughter called and wants to discuss her platelet count and treatment, mentioned something about Dr In [place]. Please return call [phone]

## 2017-08-01 NOTE — Telephone Encounter (Signed)
  Please call patient's daughter back.  I have not spoken to the other doctor in [place].  We are playing phone tag .  Another message left today.  I have sent another message to Adriana Simas re: conversion from anagrelide to [facility].  We should plan on seeing her again in the near future ([place]).  I will call her next week.  M

## 2017-08-01 NOTE — Telephone Encounter (Signed)
Called patient's daughter Arrie Aran) to discuss plan of care. No answer. Left detailed message on VM.  1. Spoke with providers down in [place] to try to obtain a more comprehensive view of treatment while under their care. Treatment she received is still unclear.    2. Counts at this point place patient at increased risk for developing clots.   3. Need to bridge patient off of anagrelide and onto the new medication, Jakafi.   4. Presented idea of repeat bone marrow. Made aware that it will not change treatment strategy. Will provide better picture at this point and time as to the state of patient's bone marrow (worsening myelofibrosis).   5. Presented idea of therapeutic trial of small volume phlebotomies with saline volume replacement. Made aware that this approach will require weekly lab monitoring. Briefly discussed that this approach carries risks and side effects such as vertiginous symptoms and Fe deficiency that could potentially lead to worsening thrombocytopenia.   [name] was advised to send questions, over the weekend, through [place]. I will plan on adding patient to the Wednesday clinic schedule in [place] next week. Will have Rodena Piety, RN touch base with patient's daughter on Monday to make her aware of appointment time. Additionally, daughter asked to return a call to [facility] if Wednesday will not work, at which time patient will be rescheduled.   Honor Loh, MSN, APRN, FNP-C, CEN Oncology/Hematology Nurse Practitioner  [facility] 08/01/17,  6:57 PM

## 2017-08-04 ENCOUNTER — Other Ambulatory Visit: Payer: Self-pay | Admitting: *Deleted

## 2017-08-04 DIAGNOSIS — D45 Polycythemia vera: Secondary | ICD-10-CM

## 2017-08-06 ENCOUNTER — Telehealth: Payer: Self-pay | Admitting: Pharmacist

## 2017-08-06 ENCOUNTER — Inpatient Hospital Stay: Payer: Medicare Other

## 2017-08-06 ENCOUNTER — Other Ambulatory Visit: Payer: Self-pay | Admitting: Hematology and Oncology

## 2017-08-06 ENCOUNTER — Encounter: Payer: Self-pay | Admitting: Hematology and Oncology

## 2017-08-06 ENCOUNTER — Inpatient Hospital Stay: Payer: Medicare Other | Attending: Hematology and Oncology | Admitting: Hematology and Oncology

## 2017-08-06 VITALS — BP 117/77 | HR 64 | Resp 18

## 2017-08-06 VITALS — BP 130/65 | HR 80 | Temp 97.2°F | Wt 110.0 lb

## 2017-08-06 DIAGNOSIS — Z87891 Personal history of nicotine dependence: Secondary | ICD-10-CM | POA: Diagnosis not present

## 2017-08-06 DIAGNOSIS — C946 Myelodysplastic disease, not classified: Secondary | ICD-10-CM

## 2017-08-06 DIAGNOSIS — D45 Polycythemia vera: Secondary | ICD-10-CM

## 2017-08-06 DIAGNOSIS — Z7189 Other specified counseling: Secondary | ICD-10-CM

## 2017-08-06 LAB — CBC WITH DIFFERENTIAL/PLATELET
Basophils Absolute: 0.3 10*3/uL — ABNORMAL HIGH (ref 0–0.1)
Basophils Relative: 2 %
Eosinophils Absolute: 1.1 10*3/uL — ABNORMAL HIGH (ref 0–0.7)
Eosinophils Relative: 6 %
HCT: 47 % (ref 35.0–47.0)
Hemoglobin: 15.3 g/dL (ref 12.0–16.0)
Lymphocytes Relative: 14 %
Lymphs Abs: 2.8 10*3/uL (ref 1.0–3.6)
MCH: 26.7 pg (ref 26.0–34.0)
MCHC: 32.6 g/dL (ref 32.0–36.0)
MCV: 82.1 fL (ref 80.0–100.0)
Monocytes Absolute: 1.3 10*3/uL — ABNORMAL HIGH (ref 0.2–0.9)
Monocytes Relative: 6 %
Neutro Abs: 14.6 10*3/uL — ABNORMAL HIGH (ref 1.4–6.5)
Neutrophils Relative %: 72 %
Platelets: 684 10*3/uL — ABNORMAL HIGH (ref 150–440)
RBC: 5.73 MIL/uL — ABNORMAL HIGH (ref 3.80–5.20)
RDW: 25.4 % — ABNORMAL HIGH (ref 11.5–14.5)
WBC: 20.2 10*3/uL — ABNORMAL HIGH (ref 3.6–11.0)

## 2017-08-06 LAB — COMPREHENSIVE METABOLIC PANEL
ALT: 12 U/L — ABNORMAL LOW (ref 14–54)
AST: 18 U/L (ref 15–41)
Albumin: 4.3 g/dL (ref 3.5–5.0)
Alkaline Phosphatase: 111 U/L (ref 38–126)
Anion gap: 10 (ref 5–15)
BUN: 27 mg/dL — ABNORMAL HIGH (ref 6–20)
CO2: 28 mmol/L (ref 22–32)
Calcium: 9.3 mg/dL (ref 8.9–10.3)
Chloride: 101 mmol/L (ref 101–111)
Creatinine, Ser: 1.03 mg/dL — ABNORMAL HIGH (ref 0.44–1.00)
GFR calc Af Amer: 55 mL/min — ABNORMAL LOW (ref >60)
GFR calc non Af Amer: 47 mL/min — ABNORMAL LOW (ref >60)
Glucose, Bld: 100 mg/dL — ABNORMAL HIGH (ref 65–99)
Potassium: 4.5 mmol/L (ref 3.5–5.1)
Sodium: 139 mmol/L (ref 135–145)
Total Bilirubin: 0.6 mg/dL (ref 0.3–1.2)
Total Protein: 7.3 g/dL (ref 6.5–8.1)

## 2017-08-06 MED ORDER — RUXOLITINIB PHOSPHATE 10 MG PO TABS: 10.0000 mg | ORAL_TABLET | Freq: Two times a day (BID) | ORAL | 1 refills | Status: DC

## 2017-08-06 MED ORDER — SODIUM CHLORIDE 0.9 % IV SOLN
Freq: Once | INTRAVENOUS | Status: AC
  Administered 2017-08-06: 13:00:00 via INTRAVENOUS
  Filled 2017-08-06: qty 1000

## 2017-08-06 NOTE — Progress Notes (Signed)
[facility] day:  08/06/2017  Chief Complaint: Laurissa Cowper is a 84 y.o. female with a myeloproliferative disorder who is seen for 1 month assessment.  HPI:  The patient was last seen in the hematology clinic on 07/09/2017.  At that time, she denied any complaints.  She denied any B symptoms.  Exam was stable.  She was on anagrelide.  CBC on 07/02/2017 revealed a hematocrit of 46.9, hemoglobin 15.2, MCV 83.6, platelets 645,000, WBC 18,800 with an ANC of 14,400.  During the interim, she denies any new problems.  She describes getting sleepy and taking naps.  She denies any aches and pains  She goes out daily.  She continues her anagrelide 5 days/week.   Past Medical History:  Diagnosis Date  . Polycythemia     Past Surgical History:  Procedure Laterality Date  . ABDOMINAL HYSTERECTOMY     complete    Family History  Problem Relation Age of Onset  . Heart disease Father   . Cancer Sister        breast  . Cancer Sister        breast  . Alzheimer's disease Sister     Social History:  reports that she quit smoking about 39 years ago. She has a 30.00 pack-year smoking history. She has never used smokeless tobacco. She reports that she does not drink alcohol or use drugs.  Former smoker (1/2 pack/day) from age 60s to 80s. She quit smoking in 1980.  She occasionally drinks a glass of wine.  She is widowed.  Patient is retired Sport and exercise psychologist. Patient moved to [facility] in 03/2017.  She lives independently in her own apartment in [place]. Her daughter lives next door.  Patient denies known exposures to radiation on toxins. The patient is accompanied by her daughter today.  Allergies: No Known Allergies  Current Medications: Current Outpatient Medications  Medication Sig Dispense Refill  . anagrelide (AGRYLIN) 0.5 MG capsule Take one tablet daily on Monday through Saturday, do not take on Sunday 30 capsule 0  . ruxolitinib phosphate (JAKAFI) 10 MG tablet  Take 1 tablet (10 mg total) by mouth 2 (two) times daily. 60 tablet 1  . triamcinolone cream (KENALOG) 0.1 % Apply 1 application topically 2 (two) times daily. 30 g 1   No current facility-administered medications for this visit.     Review of Systems:  GENERAL:  Some fatigue.  Takes naps.  No fevers, sweats or weight loss. PERFORMANCE STATUS (ECOG):  1-2 HEENT:  No visual changes, runny nose, sore throat, mouth sores or tenderness. Lungs: No shortness of breath or cough.  No hemoptysis. Cardiac:  No chest pain, palpitations, orthopnea, or PND. GI:  No nausea, vomiting, diarrhea, constipation, melena or hematochezia. GU:  No urgency, frequency, dysuria, or hematuria. Musculoskeletal:  No back pain.  No joint pain.  No muscle tenderness. Extremities:  No pain or swelling. Skin:  No rashes or skin changes. Neuro:  No headache, numbness or weakness, balance or coordination issues. Endocrine:  No diabetes, thyroid issues, hot flashes or night sweats. Psych:  No mood changes, depression or anxiety. Pain:  No focal pain. Review of systems:  All other systems reviewed and found to be negative.   Physical Exam: Blood pressure 130/65, pulse 80, temperature (!) 97.2 F (36.2 C), temperature source Tympanic, weight 110 lb 0.2 oz (49.9 kg), SpO2 96 %. GENERAL:  Thin elderly woman sitting comfortably in the exam room in no acute distress. MENTAL  STATUS:  Alert and oriented to person, place and time. HEAD:  Short dark blonde hair.  Normocephalic, atraumatic, face symmetric, no Cushingoid features. EYES:  Blue eyes.  Pupils equal round and reactive to light and accomodation.  No conjunctivitis or scleral icterus. ENT:  Oropharynx clear without lesion.  Tongue normal. Mucous membranes moist.  RESPIRATORY:  Clear to auscultation without rales, wheezes or rhonchi. CARDIOVASCULAR:  Regular rate and rhythm without murmur, rub or gallop. ABDOMEN:  Soft, non-tender, with active bowel sounds, and no  hepatosplenomegaly.  No masses. SKIN:  No rashes, ulcers or lesions. EXTREMITIES: No edema, no skin discoloration or tenderness.  No palpable cords. LYMPH NODES: No palpable cervical, supraclavicular, axillary or inguinal adenopathy  NEUROLOGICAL: Unremarkable. PSYCH:  Appropriate.    Appointment on 08/06/2017  Component Date Value Ref Range Status  . Sodium 08/06/2017 139  135 - 145 mmol/L Final  . Potassium 08/06/2017 4.5  3.5 - 5.1 mmol/L Final  . Chloride 08/06/2017 101  101 - 111 mmol/L Final  . CO2 08/06/2017 28  22 - 32 mmol/L Final  . Glucose, Bld 08/06/2017 100* 65 - 99 mg/dL Final  . BUN 08/06/2017 27* 6 - 20 mg/dL Final  . Creatinine, Ser 08/06/2017 1.03* 0.44 - 1.00 mg/dL Final  . Calcium 08/06/2017 9.3  8.9 - 10.3 mg/dL Final  . Total Protein 08/06/2017 7.3  6.5 - 8.1 g/dL Final  . Albumin 08/06/2017 4.3  3.5 - 5.0 g/dL Final  . AST 08/06/2017 18  15 - 41 U/L Final  . ALT 08/06/2017 12* 14 - 54 U/L Final  . Alkaline Phosphatase 08/06/2017 111  38 - 126 U/L Final  . Total Bilirubin 08/06/2017 0.6  0.3 - 1.2 mg/dL Final  . GFR calc non Af Amer 08/06/2017 47* >60 mL/min Final  . GFR calc Af Amer 08/06/2017 55* >60 mL/min Final   Comment: (NOTE) The eGFR has been calculated using the CKD EPI equation. This calculation has not been validated in all clinical situations. eGFR's persistently <60 mL/min signify possible Chronic Kidney Disease.   . Anion gap 08/06/2017 10  5 - 15 Final   Performed at [facility], [address] [place],  [address]  . WBC 08/06/2017 20.2* 3.6 - 11.0 K/uL Final  . RBC 08/06/2017 5.73* 3.80 - 5.20 MIL/uL Final  . Hemoglobin 08/06/2017 15.3  12.0 - 16.0 g/dL Final  . HCT 08/06/2017 47.0  35.0 - 47.0 % Final  . MCV 08/06/2017 82.1  80.0 - 100.0 fL Final  . MCH 08/06/2017 26.7  26.0 - 34.0 pg Final  . MCHC 08/06/2017 32.6  32.0 - 36.0 g/dL Final  . RDW 08/06/2017 25.4* 11.5 - 14.5 % Final  . Platelets 08/06/2017 684* 150 -  440 K/uL Final   Comment: PLATELET CLUMPS NOTED ON SMEAR, COUNT APPEARS ADEQUATE LARGE PLATELETS   . Neutrophils Relative % 08/06/2017 72  % Final  . Neutro Abs 08/06/2017 14.6* 1.4 - 6.5 K/uL Final  . Lymphocytes Relative 08/06/2017 14  % Final  . Lymphs Abs 08/06/2017 2.8  1.0 - 3.6 K/uL Final  . Monocytes Relative 08/06/2017 6  % Final  . Monocytes Absolute 08/06/2017 1.3* 0.2 - 0.9 K/uL Final  . Eosinophils Relative 08/06/2017 6  % Final  . Eosinophils Absolute 08/06/2017 1.1* 0 - 0.7 K/uL Final  . Basophils Relative 08/06/2017 2  % Final  . Basophils Absolute 08/06/2017 0.3* 0 - 0.1 K/uL Final   Performed at [facility],  [address], [place], [place] [address]    Assessment:  Yiselle Babich is a 84 y.o. female with a myeloproliferative disorder.  She presented in 04/2002 with thrombocytosis (835,000).  JAK2 V617F was positive on 05/20/2012.  Over the past year, hematocrit has ranged 37.5 - 42.4, hemoglobin 12.1 - 13.4, platelets 458,000 - 569,000, WBC 10,500 - 15,700.  Creatinine has been 1.0 - 1.2.  Bone marrow  on 04/15/2002 revealed a normocellular marrow for age with trilineage hematopoiesis and mild megakaryocytic hyperplasia with focal atypia.  There was no morphologic evidence of a myeloproliferative disorder.  Iron stains were inadequate for evaluation of storage iron.  There was no increase in reticulin fibers.  She has been on agrylin since 2012.  Bone marrow on 09/25/2012 revealed a persistent myeloproliferative disorder s/p Agrylin.  The current features were best classified as persistent myeloproliferative neoplasm (MPN) JAK2 V617F mutation positive.  Marrow was hypercellular for age (30-50%) with increased trilineage hematopoiesis and markedly increased atypical megakaryopoiesis with frequent clustering.  There were no increased blasts.  Iron stores were absent.  There was moderate to severe myelofibrosis (grade MF 2-3).  Differential included ET, PV,  primary myelofibrosis, and MPN, unclassifiable.  Cytogenetics were normal (39, XX).  She developed pancytopenia in 12/2015.  Etiology is unclear (medication confusion or hydroxyurea + Agrylin).  She received Procrit 40,000 units (intermittently 09/2012 - 06/2016), Venofer (09/2012 - 10/2012), and Infed (12/2015 - 03/2016).  She received Neupogen during a period of neutropenia.  Symptomatically, she denies any B symptoms.  She denies any paresthesias or headaches.  Exam is stable.  Plan: 1.  Labs today:  CBC with diff, CMP. 2.  Discuss conversation with [place] physicians. 3.  Discuss platelet goal of < 400,000 and HCT < 42.   4.  Discuss initiating a small volume phlebotomy program (150cc) with saline replacement.  Can increase volume if tolerated.   5.  Discuss consideration of switch from anagrelide to [facility].  Side effects of Jakafi discussed in detail.  Information provided. 6.  Discuss follow-up today with oral chemotherapy pharmacist. 7.  Discuss baseline bone marrow.  Given patient's age and unlikely to change treatment recommendations, discuss deferring bone marrow. 8.  Phlebotomy 150 cc with 150 cc NS replacement today. 9.  RTC weekly x 3 for CBC and +/- phlebotomy. 10.  RTC with initiation of Jakafi once approved. 11.  RTC in 4 weeks for MD assessment, labs (CBC with diff, CMP), and +/- phlebotomy.  A total of (25) minutes of face-to-face time was spent with the patient with greater than 50% of that time in counseling and care-coordination.    Lequita Asal, MD  08/06/2017, 4:35 PM

## 2017-08-06 NOTE — Patient Instructions (Signed)

## 2017-08-06 NOTE — Patient Instructions (Signed)
Ruxolitinib oral tablets What is this medicine? RUXOLITINIB (RUX oh LI ti nib) is a medicine that targets proteins in cells and stops them from growing. It is used to treat myelofibrosis and polycythemia vera. This medicine may be used for other purposes; ask your health care provider or pharmacist if you have questions. COMMON BRAND NAME(S): Jakafi What should I tell my health care provider before I take this medicine? They need to know if you have any of these conditions: -cancer of the skin -hepatitis -immune system problems -infection (especially a virus infection such as chickenpox, cold sores, or herpes) -kidney disease -liver disease -low blood counts, like low white cell, platelet, or red cell counts -an unusual or allergic reaction to ruxolitinib, other medicines, foods, dyes, or preservatives -pregnant or trying to get pregnant -breast-feeding How should I use this medicine? Take this medicine by mouth with a glass of water. Do not take with grapefruit juice. Follow the directions on the prescription label. Take your medicine at regular intervals. Do not take it more often than directed. Do not stop taking except on your doctor's advice. Talk to your pediatrician regarding the use of this medicine in children. Special care may be needed. Overdosage: If you think you have taken too much of this medicine contact a poison control center or emergency room at once. NOTE: This medicine is only for you. Do not share this medicine with others. What if I miss a dose? If you miss a dose, take it as soon as you can. If it is almost time for your next dose, take only that dose. Do not take double or extra doses. What may interact with this medicine? -antiviral medicines for HIV or AIDS like delavirdine, indinavir, lopinavir; ritonavir, nelfinavir, ritonavir, saquinavir, tipranavir -antiviral medicines for Hepatitis C like boceprevir, telaprevir -certain antibiotics like clarithromycin,  dalfopristin; quinupristin, erythromycin, isoniazid, telithromycin, rifampin -certain medicines for fungal infection like itraconazole, ketoconazole, posaconazole, voriconazole -conivaptan -grapefruit juice -mibefradil -nefazodone This list may not describe all possible interactions. Give your health care provider a list of all the medicines, herbs, non-prescription drugs, or dietary supplements you use. Also tell them if you smoke, drink alcohol, or use illegal drugs. Some items may interact with your medicine. What should I watch for while using this medicine? You may need blood work done while you are taking this medicine. Call your doctor or health care professional for advice if you get a fever, chills or sore throat, or other symptoms of a cold or flu. Do not treat yourself. This drug decreases your body's ability to fight infections. Try to avoid being around people who are sick. This medicine may increase your risk to bruise or bleed. Call your doctor or health care professional if you notice any unusual bleeding. In some patients, this medicine may cause a serious brain infection that may cause death. If you have any problems seeing, thinking, speaking, walking, or standing, tell your doctor right away. If you cannot reach your doctor, urgently seek other source of medical care. Talk to your doctor about your risk of cancer. You may be more at risk for certain types of cancers if you take this medicine. Do not breast-feed an infant while taking this medicine or for at least 2 weeks after stopping it. What side effects may I notice from receiving this medicine? Side effects that you should report to your doctor or health care professional as soon as possible: -allergic reactions like skin rash, itching or hives, swelling of the face,  lips, or tongue -low blood counts - this medicine may decrease the number of white blood cells, red blood cells and platelets. You may be at increased risk for  infections and bleeding -signs of infection - fever or chills, cough, sore throat, pain or difficulty passing urine -signs of decreased platelets or bleeding - bruising, pinpoint red spots on the skin, black, tarry stools, blood in the urine -signs of decreased red blood cells - unusually weak or tired, feeling faint or lightheaded, falls Side effects that usually do not require medical attention (report to your doctor or health care professional if they continue or are bothersome): -dizziness -headache -upset stomach -weight gain This list may not describe all possible side effects. Call your doctor for medical advice about side effects. You may report side effects to FDA at 1-800-FDA-1088. Where should I keep my medicine? Keep out of the reach of children. Store between 20 and 25 degrees C (68 and 77 degrees F). Throw away any unused medicine after the expiration date. NOTE: This sheet is a summary. It may not cover all possible information. If you have questions about this medicine, talk to your doctor, pharmacist, or health care provider.  2018 Elsevier/Gold Standard (2016-01-12 11:13:08)

## 2017-08-06 NOTE — Progress Notes (Signed)
Patient offers no complaints today. 

## 2017-08-06 NOTE — Telephone Encounter (Signed)
Oral Oncology Pharmacist Encounter  Received new prescription for Jakafi (ruxolitinib) for the treatment of myeloproliferative disorder, planned duration until disease progression or unacceptable drug toxicity. Patient is being switched from switching patient from anagrelide to [facility].   CBC from 07/02/17 assessed, no relevant lab abnormalities. Prescription dose and frequency assessed.   Current medication list in Epic reviewed, no DDIs with Jakafi identified.  Prescription has been e-scribed to the [facility] for benefits analysis and approval.  Oral Oncology Clinic will continue to follow for insurance authorization, copayment issues, initial counseling and start date.  Darl Pikes, PharmD, BCPS Hematology/Oncology Clinical Pharmacist [facility] [place] [facility] [phone]  08/06/2017 11:37 AM

## 2017-08-07 ENCOUNTER — Telehealth: Payer: Self-pay | Admitting: Pharmacy Technician

## 2017-08-07 NOTE — Telephone Encounter (Signed)
Oral Oncology Patient Advocate Encounter  Received notification from [facility] that prior authorization for [name] is required.  PA submitted on CoverMyMeds Key [id number] Status is pending  Oral Oncology Clinic will continue to follow.  Fabio Asa. Melynda Keller, [place] Patient [place] [phone] 08/07/2017 11:21 AM

## 2017-08-08 ENCOUNTER — Telehealth: Payer: Self-pay | Admitting: Pharmacist

## 2017-08-08 NOTE — Telephone Encounter (Signed)
Oral Chemotherapy Pharmacist Encounter  Successfully enrolled patient for copayment assistance funds from [place] from the Myeloproliferative Neoplasms fund. Award amount: $7,000 Effective dates: 08/08/17 - 08/09/18  ID: [id number]  Patient Responsibility: $0.00  Rx BIN: [id number]  Rx PCN: [id number]  Group #: [place] information has beeen shared with [place]. I will place a copy of the award letter to be scanned into patient's chart.  Darl Pikes, PharmD, BCPS Hematology/Oncology Clinical Pharmacist [facility] [place] [facility] [phone]  08/08/2017 2:40 PM

## 2017-08-08 NOTE — Telephone Encounter (Signed)
Oral Oncology Pharmacist Encounter   Prior Authorization for [name] has been approved.     Effective dates: 08/08/17 through 04/05/2018   Oral Oncology Clinic will continue to follow.   Darl Pikes, PharmD, BCPS Hematology/Oncology Clinical Pharmacist [facility] [place] [facility] [phone]  08/08/2017 10:42 AM

## 2017-08-08 NOTE — Telephone Encounter (Signed)
Oral Chemotherapy Pharmacist Encounter  Successfully enrolled patient for copayment assistance funds from [facility]) from the [place] [employer] Myeloproliferative Neoplasms fund. Award amount: $8,500 Effective dates: 05/10/2017 - 08/08/2018  Member ID: [id number] Group ID: [id number] RxBin ID: [id number] PCN: [id number]  Billing information will be shared with [place]. I will place a copy of the award letter to be scanned into patient's chart.  Darl Pikes, PharmD, BCPS Hematology/Oncology Clinical Pharmacist [facility] [place] [facility] [phone]  08/08/2017 3:02 PM

## 2017-08-08 NOTE — Telephone Encounter (Signed)
Oral Chemotherapy Pharmacist Encounter  Patient Education I spoke with patient's daughter Laura Wilcox for overview of new oral chemotherapy medication: Jakafi for the treatment of myeloproliferative disorder, planned duration until disease progression or unacceptable drug toxicity. Patient is being switched from switching patient from anagrelide to [facility].   Counseled Tricia on administration, dosing, side effects, monitoring, drug-food interactions, safe handling, storage, and disposal. Patient will take 1 tablet (10 mg total) by mouth 2 (two) times daily.  Laura Wilcox understands her mom is not to start [place] until she is seen in clinic and provided instructions for tapering anagrelide and starting Jakafi.   Side effects include but not limited to: Decreased plt/hgb, elevated cholestrol.    Reviewed with patient importance of keeping a medication schedule and plan for any missed doses.  Laura Wilcox voiced understanding and appreciation. All questions answered. Medication handout placed in the mail.   Provided patient with Oral [place] [facility] phone number. Patient knows to call the office with questions or concerns. Oral Chemotherapy Navigation Clinic will continue to follow.  Darl Pikes, PharmD, BCPS Hematology/Oncology Clinical Pharmacist [facility] [place] [facility] [phone]  08/08/2017 4:11 PM

## 2017-08-11 MED FILL — JAKAFI 10 MG TABLET: 10 | 30 days supply | Qty: 60 | Fill #0

## 2017-08-13 ENCOUNTER — Inpatient Hospital Stay: Payer: Medicare Other

## 2017-08-13 ENCOUNTER — Encounter: Payer: Self-pay | Admitting: Hematology and Oncology

## 2017-08-13 ENCOUNTER — Inpatient Hospital Stay (HOSPITAL_BASED_OUTPATIENT_CLINIC_OR_DEPARTMENT_OTHER): Payer: Medicare Other | Admitting: Hematology and Oncology

## 2017-08-13 ENCOUNTER — Other Ambulatory Visit: Payer: Self-pay

## 2017-08-13 VITALS — BP 82/59 | HR 78 | Resp 20

## 2017-08-13 VITALS — BP 119/68 | HR 89 | Temp 97.3°F | Resp 18 | Wt 110.0 lb

## 2017-08-13 DIAGNOSIS — D45 Polycythemia vera: Secondary | ICD-10-CM

## 2017-08-13 DIAGNOSIS — Z87891 Personal history of nicotine dependence: Secondary | ICD-10-CM

## 2017-08-13 DIAGNOSIS — Z7189 Other specified counseling: Secondary | ICD-10-CM

## 2017-08-13 LAB — CBC
HCT: 45.4 % (ref 35.0–47.0)
Hemoglobin: 14.3 g/dL (ref 12.0–16.0)
MCH: 26.2 pg (ref 26.0–34.0)
MCHC: 31.6 g/dL — ABNORMAL LOW (ref 32.0–36.0)
MCV: 83.1 fL (ref 80.0–100.0)
Platelets: 766 10*3/uL — ABNORMAL HIGH (ref 150–440)
RBC: 5.47 MIL/uL — ABNORMAL HIGH (ref 3.80–5.20)
RDW: 25.7 % — ABNORMAL HIGH (ref 11.5–14.5)
WBC: 22.1 10*3/uL — ABNORMAL HIGH (ref 3.6–11.0)

## 2017-08-13 MED ORDER — SODIUM CHLORIDE 0.9 % IV SOLN
Freq: Once | INTRAVENOUS | Status: AC
  Administered 2017-08-13: 12:00:00 via INTRAVENOUS
  Filled 2017-08-13: qty 1000

## 2017-08-13 NOTE — Progress Notes (Signed)
Patient here today for follow up. No concerns voiced.  °

## 2017-08-13 NOTE — Progress Notes (Signed)
[facility] day:  08/13/2017   Chief Complaint: Laura Wilcox is a 84 y.o. female with polycythemia rubra vera (PV) who is seen for weekly phlebotomy and initiation of Jakafi.  HPI:  The patient was last seen in the hematology clinic on 08/06/2017.  At that time, she denied any complaints.  She denied any B symptoms.  Exam was stable.  She was on anagrelide. We discussed switch to [facility].  A phlebotomy program was initiated.  During the interim, she has done well.  She tolerated her phlebotomy without side effect.  She has had less fatigue since her phlebotomy.  She describes shopping for 7 hours yesterday.    Past Medical History:  Diagnosis Date  . Polycythemia     Past Surgical History:  Procedure Laterality Date  . ABDOMINAL HYSTERECTOMY     complete    Family History  Problem Relation Age of Onset  . Heart disease Father   . Cancer Sister        breast  . Cancer Sister        breast  . Alzheimer's disease Sister     Social History:  reports that she quit smoking about 39 years ago. She has a 30.00 pack-year smoking history. She has never used smokeless tobacco. She reports that she does not drink alcohol or use drugs.  Former smoker (1/2 pack/day) from age 21s to 43s. She quit smoking in 1980.  She occasionally drinks a glass of wine.  She is widowed.  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  She lives independently in her own apartment in [place]. Her daughter lives next door.  Patient denies known exposures to radiation on toxins. The patient is accompanied by her daughter today.  Allergies: No Known Allergies  Current Medications: Current Outpatient Medications  Medication Sig Dispense Refill  . anagrelide (AGRYLIN) 0.5 MG capsule Take one tablet daily on Monday through Saturday, do not take on Sunday 30 capsule 0  . ruxolitinib phosphate (JAKAFI) 10 MG tablet Take 1 tablet (10 mg total) by mouth 2 (two)  times daily. 60 tablet 1  . triamcinolone cream (KENALOG) 0.1 % Apply 1 application topically 2 (two) times daily. (Patient not taking: Reported on 08/13/2017) 30 g 1   No current facility-administered medications for this visit.     Review of Systems:  GENERAL:  Less fatigue.  Still likes to take naps.  Active.  No fevers, sweats or weight loss.  Weight stable. PERFORMANCE STATUS (ECOG):  1-2 HEENT:  No visual changes, runny nose, sore throat, mouth sores or tenderness. Lungs: No shortness of breath or cough.  No hemoptysis. Cardiac:  No chest pain, palpitations, orthopnea, or PND. GI:  No nausea, vomiting, diarrhea, constipation, melena or hematochezia. GU:  No urgency, frequency, dysuria, or hematuria. Musculoskeletal:  No back pain.  No joint pain.  No muscle tenderness. Extremities:  No pain or swelling. Skin:  No rashes or skin changes. Neuro:  No headache, numbness or weakness, balance or coordination issues. Endocrine:  No diabetes, thyroid issues, hot flashes or night sweats. Psych:  No mood changes, depression or anxiety. Pain:  No focal pain. Review of systems:  All other systems reviewed and found to be negative.   Physical Exam: Blood pressure 119/68, pulse 89, temperature (!) 97.3 F (36.3 C), temperature source Tympanic, resp. rate 18, weight 110 lb 0.2 oz (49.9 kg), SpO2 98 %. GENERAL:  Thin elderly woman sitting comfortably  in the exam room in no acute distress.  She easily gets onto exam table without assistance. MENTAL STATUS:  Alert and oriented to person, place and time. HEAD:  Short dark blonde hair.  Normocephalic, atraumatic, face symmetric, no Cushingoid features. EYES:  Blue eyes.  No conjunctivitis or scleral icterus. RESPIRATORY:  Clear to auscultation without rales, wheezes or rhonchi. CARDIOVASCULAR:  Regular rate and rhythm without murmur, rub or gallop. SKIN:  No rashes, ulcers or lesions. EXTREMITIES: No edema, no skin discoloration or tenderness.  No  palpable cords. LYMPH NODES: No palpable cervical, supraclavicular, or axillaryadenopathy  NEUROLOGICAL: Unremarkable. PSYCH:  Appropriate.    Appointment on 08/13/2017  Component Date Value Ref Range Status  . WBC 08/13/2017 22.1* 3.6 - 11.0 K/uL Final  . RBC 08/13/2017 5.47* 3.80 - 5.20 MIL/uL Final  . Hemoglobin 08/13/2017 14.3  12.0 - 16.0 g/dL Final  . HCT 08/13/2017 45.4  35.0 - 47.0 % Final  . MCV 08/13/2017 83.1  80.0 - 100.0 fL Final  . MCH 08/13/2017 26.2  26.0 - 34.0 pg Final  . MCHC 08/13/2017 31.6* 32.0 - 36.0 g/dL Final  . RDW 08/13/2017 25.7* 11.5 - 14.5 % Final  . Platelets 08/13/2017 766* 150 - 440 K/uL Final   Comment: PLATELET COUNT CONFIRMED BY SMEAR LARGE PLATELETS PRESENT Performed at [facility], [address], [place], [place] [address]     Assessment:  Laura Wilcox is a 84 y.o. female with polycythemia rubra vera and secondary myelofibrosis.  She presented in 04/2002 with thrombocytosis (835,000).  JAK2 V617F was positive on 05/20/2012.  Over the past year, hematocrit has ranged 37.5 - 42.4, hemoglobin 12.1 - 13.4, platelets 458,000 - 569,000, WBC 10,500 - 15,700.  Creatinine has been 1.0 - 1.2.  Bone marrow  on 04/15/2002 revealed a normocellular marrow for age with trilineage hematopoiesis and mild megakaryocytic hyperplasia with focal atypia.  There was no morphologic evidence of a myeloproliferative disorder.  Iron stains were inadequate for evaluation of storage iron.  There was no increase in reticulin fibers.  She has been on agrylin since 2012.  Bone marrow on 09/25/2012 revealed a persistent myeloproliferative disorder s/p Agrylin.  The current features were best classified as persistent myeloproliferative neoplasm (MPN) JAK2 V617F mutation positive.  Marrow was hypercellular for age (30-50%) with increased trilineage hematopoiesis and markedly increased atypical megakaryopoiesis with frequent clustering.  There were no increased  blasts.  Iron stores were absent.  There was moderate to severe myelofibrosis (grade MF 2-3).  Differential included ET, PV, primary myelofibrosis, and MPN, unclassifiable.  Cytogenetics were normal (28, XX).  She developed pancytopenia in 12/2015.  Etiology is unclear (medication confusion or hydroxyurea + Agrylin).  She received Procrit 40,000 units (intermittently 09/2012 - 06/2016), Venofer (09/2012 - 10/2012), and Infed (12/2015 - 03/2016).  She received Neupogen during a period of neutropenia. She has been on anagrelide since 2004.  She began a phlebotomy program on 08/06/2017.  She undergoes small volume (150 cc with replacement of 150 cc NS) if her HCT > 42.  Symptomatically, she denies any B symptoms.  She has had more energy since her phlebotomy.  Exam is stable.  Plan: 1.  Labs today:  CBC with diff. 2.  Discuss initiation of Jakafi.  Side effects reviewed.  Patient consented to treatment. 3.  Discontinue anagrelide. 4.  Phlebotomy 150 cc with 150 cc NS replacement today. 5.  RTC weekly x 3 for CBC and +/- phlebotomy. 6.  RTC on 09/03/2017 for  MD assessment, labs (CBC with diff, CMP), and +/- phlebotomy.   Lequita Asal, MD  08/13/2017, 2:34 PM

## 2017-08-13 NOTE — Patient Instructions (Signed)

## 2017-08-20 ENCOUNTER — Inpatient Hospital Stay: Payer: Medicare Other

## 2017-08-20 ENCOUNTER — Telehealth: Payer: Self-pay | Admitting: *Deleted

## 2017-08-20 VITALS — BP 106/74 | HR 69 | Temp 98.2°F | Resp 20

## 2017-08-20 DIAGNOSIS — Z87891 Personal history of nicotine dependence: Secondary | ICD-10-CM | POA: Diagnosis not present

## 2017-08-20 DIAGNOSIS — D45 Polycythemia vera: Secondary | ICD-10-CM

## 2017-08-20 LAB — CBC
HCT: 45.3 % (ref 35.0–47.0)
Hemoglobin: 14.4 g/dL (ref 12.0–16.0)
MCH: 26.2 pg (ref 26.0–34.0)
MCHC: 31.7 g/dL — ABNORMAL LOW (ref 32.0–36.0)
MCV: 82.5 fL (ref 80.0–100.0)
Platelets: 829 10*3/uL — ABNORMAL HIGH (ref 150–440)
RBC: 5.49 MIL/uL — ABNORMAL HIGH (ref 3.80–5.20)
RDW: 25.3 % — ABNORMAL HIGH (ref 11.5–14.5)
WBC: 13.6 10*3/uL — ABNORMAL HIGH (ref 3.6–11.0)

## 2017-08-20 MED ORDER — SODIUM CHLORIDE 0.9 % IV SOLN
Freq: Once | INTRAVENOUS | Status: DC
  Filled 2017-08-20: qty 1000

## 2017-08-20 NOTE — Patient Instructions (Signed)

## 2017-08-20 NOTE — Telephone Encounter (Signed)
Called patient's daughter to inquire if patient is taking her [name] and if so if she is tolerating it.  Daughter states she is taking it as scheduled and is having no problems at all.  She states her concerns that her mother is not eating or drinking as she should when the daughter is not there with her.  I also informed her that it is early to tell if her labs are improving, however reviewed today's labs with her.  She did have her phlebotomy today.

## 2017-08-20 NOTE — Telephone Encounter (Signed)
-----   Message from Lequita Asal, MD sent at 08/20/2017  1:24 PM EDT ----- Regarding: Please call patient  Inform patient/daughter of counts.  Is she taking Jakafi and tolerating?  Still early regarding any change in counts.  Did she get a phlebotomy?  M  ----- Message ----- From: Interface, Lab In [place] Sent: 08/20/2017  10:40 AM To: Lequita Asal, MD

## 2017-08-27 ENCOUNTER — Inpatient Hospital Stay: Payer: Medicare Other

## 2017-08-27 ENCOUNTER — Telehealth: Payer: Self-pay | Admitting: *Deleted

## 2017-08-27 DIAGNOSIS — D45 Polycythemia vera: Secondary | ICD-10-CM | POA: Diagnosis not present

## 2017-08-27 DIAGNOSIS — Z87891 Personal history of nicotine dependence: Secondary | ICD-10-CM | POA: Diagnosis not present

## 2017-08-27 LAB — CBC
HCT: 43.5 % (ref 35.0–47.0)
Hemoglobin: 14.1 g/dL (ref 12.0–16.0)
MCH: 26.4 pg (ref 26.0–34.0)
MCHC: 32.4 g/dL (ref 32.0–36.0)
MCV: 81.6 fL (ref 80.0–100.0)
Platelets: 556 10*3/uL — ABNORMAL HIGH (ref 150–440)
RBC: 5.32 MIL/uL — ABNORMAL HIGH (ref 3.80–5.20)
RDW: 24.7 % — ABNORMAL HIGH (ref 11.5–14.5)
WBC: 9.1 10*3/uL (ref 3.6–11.0)

## 2017-08-27 LAB — COMPREHENSIVE METABOLIC PANEL
ALT: 13 U/L — ABNORMAL LOW (ref 14–54)
AST: 24 U/L (ref 15–41)
Albumin: 4.5 g/dL (ref 3.5–5.0)
Alkaline Phosphatase: 102 U/L (ref 38–126)
Anion gap: 10 (ref 5–15)
BUN: 25 mg/dL — ABNORMAL HIGH (ref 6–20)
CO2: 24 mmol/L (ref 22–32)
Calcium: 8.9 mg/dL (ref 8.9–10.3)
Chloride: 99 mmol/L — ABNORMAL LOW (ref 101–111)
Creatinine, Ser: 0.98 mg/dL (ref 0.44–1.00)
GFR calc Af Amer: 58 mL/min — ABNORMAL LOW (ref >60)
GFR calc non Af Amer: 50 mL/min — ABNORMAL LOW (ref >60)
Glucose, Bld: 82 mg/dL (ref 65–99)
Potassium: 4.2 mmol/L (ref 3.5–5.1)
Sodium: 133 mmol/L — ABNORMAL LOW (ref 135–145)
Total Bilirubin: 0.6 mg/dL (ref 0.3–1.2)
Total Protein: 7.4 g/dL (ref 6.5–8.1)

## 2017-08-27 NOTE — Patient Instructions (Signed)

## 2017-08-27 NOTE — Telephone Encounter (Signed)
Called patient's daughter [name] to inform her that patient's platelets are improving.  She was grateful for call.

## 2017-08-27 NOTE — Telephone Encounter (Signed)
-----   Message from Lequita Asal, MD sent at 08/27/2017  4:08 PM EDT ----- Regarding: Please call patient  Platelet count improving.  M  ----- Message ----- From: Interface, Lab In [place] Sent: 08/27/2017  10:45 AM To: Lequita Asal, MD

## 2017-08-28 ENCOUNTER — Other Ambulatory Visit: Payer: Self-pay | Admitting: *Deleted

## 2017-08-28 DIAGNOSIS — D45 Polycythemia vera: Secondary | ICD-10-CM

## 2017-09-03 ENCOUNTER — Inpatient Hospital Stay: Payer: Medicare Other

## 2017-09-03 ENCOUNTER — Inpatient Hospital Stay: Payer: Medicare Other | Attending: Hematology and Oncology | Admitting: Hematology and Oncology

## 2017-09-03 ENCOUNTER — Other Ambulatory Visit: Payer: Self-pay | Admitting: Hematology and Oncology

## 2017-09-03 ENCOUNTER — Encounter: Payer: Self-pay | Admitting: Hematology and Oncology

## 2017-09-03 VITALS — BP 145/76 | HR 69 | Temp 96.7°F | Resp 18 | Wt 107.4 lb

## 2017-09-03 DIAGNOSIS — D45 Polycythemia vera: Secondary | ICD-10-CM | POA: Diagnosis not present

## 2017-09-03 DIAGNOSIS — D61818 Other pancytopenia: Secondary | ICD-10-CM | POA: Diagnosis not present

## 2017-09-03 DIAGNOSIS — Z87891 Personal history of nicotine dependence: Secondary | ICD-10-CM | POA: Diagnosis not present

## 2017-09-03 DIAGNOSIS — D7581 Myelofibrosis: Secondary | ICD-10-CM | POA: Diagnosis not present

## 2017-09-03 LAB — CBC WITH DIFFERENTIAL/PLATELET
BASOS PCT: 5 %
Basophils Absolute: 0.5 10*3/uL — ABNORMAL HIGH (ref 0–0.1)
EOS PCT: 10 %
Eosinophils Absolute: 1 10*3/uL — ABNORMAL HIGH (ref 0–0.7)
HEMATOCRIT: 41.9 % (ref 35.0–47.0)
Hemoglobin: 13.7 g/dL (ref 12.0–16.0)
Lymphocytes Relative: 14 %
Lymphs Abs: 1.4 10*3/uL (ref 1.0–3.6)
MCH: 26.5 pg (ref 26.0–34.0)
MCHC: 32.8 g/dL (ref 32.0–36.0)
MCV: 81 fL (ref 80.0–100.0)
MONO ABS: 0.6 10*3/uL (ref 0.2–0.9)
Monocytes Relative: 7 %
NEUTROS ABS: 6.3 10*3/uL (ref 1.4–6.5)
Neutrophils Relative %: 64 %
PLATELETS: 505 10*3/uL — AB (ref 150–440)
RBC: 5.18 MIL/uL (ref 3.80–5.20)
RDW: 25.3 % — AB (ref 11.5–14.5)
WBC: 9.8 10*3/uL (ref 3.6–11.0)

## 2017-09-03 LAB — COMPREHENSIVE METABOLIC PANEL
ALBUMIN: 4.2 g/dL (ref 3.5–5.0)
ALT: 13 U/L — ABNORMAL LOW (ref 14–54)
ANION GAP: 10 (ref 5–15)
AST: 24 U/L (ref 15–41)
Alkaline Phosphatase: 106 U/L (ref 38–126)
BILIRUBIN TOTAL: 0.5 mg/dL (ref 0.3–1.2)
BUN: 27 mg/dL — ABNORMAL HIGH (ref 6–20)
CO2: 26 mmol/L (ref 22–32)
Calcium: 9 mg/dL (ref 8.9–10.3)
Chloride: 101 mmol/L (ref 101–111)
Creatinine, Ser: 1.13 mg/dL — ABNORMAL HIGH (ref 0.44–1.00)
GFR calc Af Amer: 49 mL/min — ABNORMAL LOW (ref >60)
GFR calc non Af Amer: 42 mL/min — ABNORMAL LOW (ref >60)
GLUCOSE: 79 mg/dL (ref 65–99)
Potassium: 4.7 mmol/L (ref 3.5–5.1)
Sodium: 137 mmol/L (ref 135–145)
Total Protein: 7.1 g/dL (ref 6.5–8.1)

## 2017-09-03 LAB — FERRITIN: Ferritin: 529 ng/mL — ABNORMAL HIGH (ref 11–307)

## 2017-09-03 NOTE — Progress Notes (Signed)
Patient offers no complaints today. 

## 2017-09-03 NOTE — Progress Notes (Signed)
[facility] day:  09/03/2017  Chief Complaint: Remee Charley is a 84 y.o. female with polycythemia rubra vera (PV) who is seen for weekly phlebotomy and 1 month assessment on Jakafi.  HPI:  The patient was last seen in the hematology clinic on 08/13/2017.  At that time, she denied any B symptoms.  She had more energy since her phlebotomy.  Exam was stable.  She underwent phlebotomy.  She stopped anagrelide.  She began [place].  CBC on 08/13/2017:  Hematocrit 45.4, hemoglobin 14.3, MCV 83.1, platelets 766,000, WBC 22,100. CBC on 08/20/2017:  Hematocrit 45.3, hemoglobin 14.4, MCV 82.5, platelets 829,000, WBC 13,600. CBC on 08/27/2017:  Hematocrit 43.5, hemoglobin 14.1, MCV 81.6, platelets 556,000, WBC 9,100.  She underwent phlebotomy weekly.  Goal hematocrit is < 42.  During the interim, she has done well.  She states that she missed a pill on Thursday/Friday.  She denies any headaches or paresthesias.   Past Medical History:  Diagnosis Date  . Polycythemia     Past Surgical History:  Procedure Laterality Date  . ABDOMINAL HYSTERECTOMY     complete  . BREAST SURGERY      Family History  Problem Relation Age of Onset  . Heart disease Father   . Cancer Sister        breast  . Cancer Sister        breast  . Alzheimer's disease Sister     Social History:  reports that she quit smoking about 39 years ago. She has a 30.00 pack-year smoking history. She has never used smokeless tobacco. She reports that she does not drink alcohol or use drugs.  Former smoker (1/2 pack/day) from age 60s to 38s. She quit smoking in 1980.  She occasionally drinks a glass of wine.  She is widowed.  Patient is retired Sport and exercise psychologist. Patient moved to [facility] in 03/2017.  She lives independently in her own apartment in [place]. Her daughter lives next door.  Patient denies known exposures to radiation on toxins. The patient is accompanied by her daughter today.  Allergies: No  Known Allergies  Current Medications: Current Outpatient Medications  Medication Sig Dispense Refill  . ruxolitinib phosphate (JAKAFI) 10 MG tablet Take 1 tablet (10 mg total) by mouth 2 (two) times daily. 60 tablet 1   No current facility-administered medications for this visit.     Review of Systems:  GENERAL:  Feels "fine".  No fevers, sweats.  Weight down 3 pounds. PERFORMANCE STATUS (ECOG):  1-2 HEENT:  No visual changes, runny nose, sore throat, mouth sores or tenderness. Lungs: No shortness of breath or cough.  No hemoptysis. Cardiac:  No chest pain, palpitations, orthopnea, or PND. GI:  No nausea, vomiting, diarrhea, constipation, melena or hematochezia. GU:  No urgency, frequency, dysuria, or hematuria. Musculoskeletal:  No back pain.  No joint pain.  No muscle tenderness. Extremities:  No pain or swelling. Skin:  No rashes or skin changes. Neuro:  No headache, numbness or weakness, balance or coordination issues. Endocrine:  No diabetes, thyroid issues, hot flashes or night sweats. Psych:  No mood changes, depression or anxiety. Pain:  No focal pain. Review of systems:  All other systems reviewed and found to be negative.   Physical Exam: Blood pressure (!) 145/76, pulse 69, temperature (!) 96.7 F (35.9 C), temperature source Tympanic, resp. rate 18, weight 107 lb 5.8 oz (48.7 kg), SpO2 98 %. GENERAL:  Thin elderly woman sitting comfortably in the exam room  in no acute distress. MENTAL STATUS:  Alert and oriented to person, place and time. HEAD:  Short dark blonde hair.  Normocephalic, atraumatic, face symmetric, no Cushingoid features. EYES:  Blue eyes.  Pupils equal round and reactive to light and accomodation.  No conjunctivitis or scleral icterus. ENT:  Oropharynx clear without lesion.  Tongue normal. Mucous membranes moist.  RESPIRATORY:  Clear to auscultation without rales, wheezes or rhonchi. CARDIOVASCULAR:  Regular rate and rhythm without murmur, rub or  gallop. ABDOMEN:  Soft, non-tender, with active bowel sounds, and no hepatosplenomegaly.  No masses. SKIN:  No rashes, ulcers or lesions. EXTREMITIES: No edema, no skin discoloration or tenderness.  No palpable cords. LYMPH NODES: No palpable cervical, supraclavicular, axillary or inguinal adenopathy  NEUROLOGICAL: Unremarkable. PSYCH:  Appropriate.    Appointment on 09/03/2017  Component Date Value Ref Range Status  . Ferritin 09/03/2017 529* 11 - 307 ng/mL Final   Performed at [facility], [address] [place], [place] [address]  . Sodium 09/03/2017 137  135 - 145 mmol/L Final  . Potassium 09/03/2017 4.7  3.5 - 5.1 mmol/L Final  . Chloride 09/03/2017 101  101 - 111 mmol/L Final  . CO2 09/03/2017 26  22 - 32 mmol/L Final  . Glucose, Bld 09/03/2017 79  65 - 99 mg/dL Final  . BUN 09/03/2017 27* 6 - 20 mg/dL Final  . Creatinine, Ser 09/03/2017 1.13* 0.44 - 1.00 mg/dL Final  . Calcium 09/03/2017 9.0  8.9 - 10.3 mg/dL Final  . Total Protein 09/03/2017 7.1  6.5 - 8.1 g/dL Final  . Albumin 09/03/2017 4.2  3.5 - 5.0 g/dL Final  . AST 09/03/2017 24  15 - 41 U/L Final  . ALT 09/03/2017 13* 14 - 54 U/L Final  . Alkaline Phosphatase 09/03/2017 106  38 - 126 U/L Final  . Total Bilirubin 09/03/2017 0.5  0.3 - 1.2 mg/dL Final  . GFR calc non Af Amer 09/03/2017 42* >60 mL/min Final  . GFR calc Af Amer 09/03/2017 49* >60 mL/min Final   Comment: (NOTE) The eGFR has been calculated using the CKD EPI equation. This calculation has not been validated in all clinical situations. eGFR's persistently <60 mL/min signify possible Chronic Kidney Disease.   . Anion gap 09/03/2017 10  5 - 15 Final   Performed at [facility], [address] [place], [place] [address]  . WBC 09/03/2017 9.8  3.6 - 11.0 K/uL Final  . RBC 09/03/2017 5.18  3.80 - 5.20 MIL/uL Final  . Hemoglobin 09/03/2017 13.7  12.0 - 16.0 g/dL Final  . HCT 09/03/2017 41.9  35.0 - 47.0 % Final  . MCV 09/03/2017  81.0  80.0 - 100.0 fL Final  . MCH 09/03/2017 26.5  26.0 - 34.0 pg Final  . MCHC 09/03/2017 32.8  32.0 - 36.0 g/dL Final  . RDW 09/03/2017 25.3* 11.5 - 14.5 % Final  . Platelets 09/03/2017 505* 150 - 440 K/uL Final  . Neutrophils Relative % 09/03/2017 64  % Final  . Neutro Abs 09/03/2017 6.3  1.4 - 6.5 K/uL Final  . Lymphocytes Relative 09/03/2017 14  % Final  . Lymphs Abs 09/03/2017 1.4  1.0 - 3.6 K/uL Final  . Monocytes Relative 09/03/2017 7  % Final  . Monocytes Absolute 09/03/2017 0.6  0.2 - 0.9 K/uL Final  . Eosinophils Relative 09/03/2017 10  % Final  . Eosinophils Absolute 09/03/2017 1.0* 0 - 0.7 K/uL Final  . Basophils Relative 09/03/2017 5  % Final  .  Basophils Absolute 09/03/2017 0.5* 0 - 0.1 K/uL Final   Performed at [facility], [address], [place], [place] [address]    Assessment:  Tylie Golonka is a 84 y.o. female with polycythemia rubra vera and secondary myelofibrosis.  She presented in 04/2002 with thrombocytosis (835,000).  JAK2 V617F was positive on 05/20/2012.  Over the past year, hematocrit has ranged 37.5 - 42.4, hemoglobin 12.1 - 13.4, platelets 458,000 - 569,000, WBC 10,500 - 15,700.  Creatinine has been 1.0 - 1.2.  Bone marrow  on 04/15/2002 revealed a normocellular marrow for age with trilineage hematopoiesis and mild megakaryocytic hyperplasia with focal atypia.  There was no morphologic evidence of a myeloproliferative disorder.  Iron stains were inadequate for evaluation of storage iron.  There was no increase in reticulin fibers.  She has been on agrylin since 2012.  Bone marrow on 09/25/2012 revealed a persistent myeloproliferative disorder s/p Agrylin.  The current features were best classified as persistent myeloproliferative neoplasm (MPN) JAK2 V617F mutation positive.  Marrow was hypercellular for age (30-50%) with increased trilineage hematopoiesis and markedly increased atypical megakaryopoiesis with frequent clustering.  There were no  increased blasts.  Iron stores were absent.  There was moderate to severe myelofibrosis (grade MF 2-3).  Differential included ET, PV, primary myelofibrosis, and MPN, unclassifiable.  Cytogenetics were normal (15, XX).  She developed pancytopenia in 12/2015.  Etiology is unclear (medication confusion or hydroxyurea + Agrylin).  She received Procrit 40,000 units (intermittently 09/2012 - 06/2016), Venofer (09/2012 - 10/2012), and Infed (12/2015 - 03/2016).  She received Neupogen during a period of neutropenia. She was anagrelide from 2004 - 08/13/2017.  She began [place] on 08/14/2017.  She began a phlebotomy program on 08/06/2017.  She undergoes small volume (150 cc with replacement of 150 cc NS) if her HCT > 42.  Symptomatically, she feels good.  Exam is stable.  Hematocrit is 41.9.  Platelet count is 505,000.  Plan: 1.  Labs today:  CBC with diff, CMP, ferritin. 2.  No phlebotomy today. 3.  Discuss slowly improving platelet count. 4.  Continue Jakafi. 5.  RTC in 2 weeks for labs (CBC with diff) +/- phlebotomy 6.  RTC in 4 weeks for MD assessment, labs (CBC with diff, CMP, triglycerides), +/- phlebotomy   Lequita Asal, MD  09/03/2017, 3:35 PM

## 2017-09-10 MED FILL — JAKAFI 10 MG TABLET: 10 | 30 days supply | Qty: 60 | Fill #1

## 2017-09-17 ENCOUNTER — Inpatient Hospital Stay: Payer: Medicare Other

## 2017-09-17 DIAGNOSIS — Z87891 Personal history of nicotine dependence: Secondary | ICD-10-CM | POA: Diagnosis not present

## 2017-09-17 DIAGNOSIS — D45 Polycythemia vera: Secondary | ICD-10-CM | POA: Diagnosis not present

## 2017-09-17 DIAGNOSIS — D61818 Other pancytopenia: Secondary | ICD-10-CM | POA: Diagnosis not present

## 2017-09-17 DIAGNOSIS — D7581 Myelofibrosis: Secondary | ICD-10-CM | POA: Diagnosis not present

## 2017-09-17 LAB — CBC WITH DIFFERENTIAL/PLATELET
Basophils Absolute: 0.1 10*3/uL (ref 0–0.1)
Basophils Relative: 1 %
Eosinophils Absolute: 0.9 10*3/uL — ABNORMAL HIGH (ref 0–0.7)
Eosinophils Relative: 9 %
HCT: 39.8 % (ref 35.0–47.0)
Hemoglobin: 12.7 g/dL (ref 12.0–16.0)
Lymphocytes Relative: 18 %
Lymphs Abs: 1.6 10*3/uL (ref 1.0–3.6)
MCH: 25.9 pg — ABNORMAL LOW (ref 26.0–34.0)
MCHC: 32 g/dL (ref 32.0–36.0)
MCV: 81 fL (ref 80.0–100.0)
Monocytes Absolute: 0.9 10*3/uL (ref 0.2–0.9)
Monocytes Relative: 9 %
Neutro Abs: 5.8 10*3/uL (ref 1.4–6.5)
Neutrophils Relative %: 63 %
Platelets: 347 10*3/uL (ref 150–440)
RBC: 4.91 MIL/uL (ref 3.80–5.20)
RDW: 24.4 % — ABNORMAL HIGH (ref 11.5–14.5)
WBC: 9.2 10*3/uL (ref 3.6–11.0)

## 2017-09-28 ENCOUNTER — Emergency Department
Admission: EM | Admit: 2017-09-28 | Discharge: 2017-09-28 | Disposition: A | Discharge status: Home / Self Care | Payer: Medicare Other | Attending: Emergency Medicine | Admitting: Emergency Medicine

## 2017-09-28 ENCOUNTER — Other Ambulatory Visit: Payer: Self-pay

## 2017-09-28 ENCOUNTER — Emergency Department: Payer: Medicare Other

## 2017-09-28 DIAGNOSIS — Z87891 Personal history of nicotine dependence: Secondary | ICD-10-CM | POA: Insufficient documentation

## 2017-09-28 DIAGNOSIS — Z79899 Other long term (current) drug therapy: Secondary | ICD-10-CM | POA: Insufficient documentation

## 2017-09-28 DIAGNOSIS — R202 Paresthesia of skin: Secondary | ICD-10-CM | POA: Diagnosis not present

## 2017-09-28 DIAGNOSIS — R200 Anesthesia of skin: Secondary | ICD-10-CM | POA: Diagnosis not present

## 2017-09-28 DIAGNOSIS — R531 Weakness: Secondary | ICD-10-CM | POA: Diagnosis not present

## 2017-09-28 DIAGNOSIS — E860 Dehydration: Secondary | ICD-10-CM | POA: Insufficient documentation

## 2017-09-28 LAB — BASIC METABOLIC PANEL
Anion gap: 9 (ref 5–15)
BUN: 28 mg/dL — ABNORMAL HIGH (ref 8–23)
CHLORIDE: 102 mmol/L (ref 98–111)
CO2: 27 mmol/L (ref 22–32)
CREATININE: 1.13 mg/dL — AB (ref 0.44–1.00)
Calcium: 8.9 mg/dL (ref 8.9–10.3)
GFR, EST AFRICAN AMERICAN: 49 mL/min — AB (ref >60)
GFR, EST NON AFRICAN AMERICAN: 42 mL/min — AB (ref >60)
Glucose, Bld: 142 mg/dL — ABNORMAL HIGH (ref 70–99)
POTASSIUM: 4.1 mmol/L (ref 3.5–5.1)
SODIUM: 138 mmol/L (ref 135–145)

## 2017-09-28 LAB — URINALYSIS, COMPLETE (UACMP) WITH MICROSCOPIC
Bilirubin Urine: NEGATIVE
GLUCOSE, UA: NEGATIVE mg/dL
Hgb urine dipstick: NEGATIVE
Ketones, ur: NEGATIVE mg/dL
LEUKOCYTES UA: NEGATIVE
NITRITE: NEGATIVE
PH: 7 (ref 5.0–8.0)
Protein, ur: NEGATIVE mg/dL
Specific Gravity, Urine: 1.009 (ref 1.005–1.030)
WBC UA: NONE SEEN WBC/hpf (ref 0–5)

## 2017-09-28 LAB — CBC
HEMATOCRIT: 36.8 % (ref 35.0–47.0)
Hemoglobin: 12.2 g/dL (ref 12.0–16.0)
MCH: 26.8 pg (ref 26.0–34.0)
MCHC: 33.2 g/dL (ref 32.0–36.0)
MCV: 80.7 fL (ref 80.0–100.0)
PLATELETS: 333 10*3/uL (ref 150–440)
RBC: 4.57 MIL/uL (ref 3.80–5.20)
RDW: 24.8 % — ABNORMAL HIGH (ref 11.5–14.5)
WBC: 9.1 10*3/uL (ref 3.6–11.0)

## 2017-09-28 LAB — TROPONIN I: Troponin I: <0.03 ng/mL (ref <0.03)

## 2017-09-28 MED ORDER — ASPIRIN 81 MG PO CHEW
324.0000 mg | CHEWABLE_TABLET | Freq: Once | ORAL | Status: AC
  Administered 2017-09-28: 324 mg via ORAL
  Filled 2017-09-28: qty 4

## 2017-09-28 MED ORDER — SODIUM CHLORIDE 0.9 % IV BOLUS
500.0000 mL | Freq: Once | INTRAVENOUS | Status: AC
Start: 2017-09-28 — End: 2017-09-28
  Administered 2017-09-28: 500 mL via INTRAVENOUS

## 2017-09-28 NOTE — ED Triage Notes (Signed)
Pt was walking out to car around 10:40 and c/o of L arm and leg numbness. States a few minutes of numbness and then it resolved. No numbness at all at this time. Pt is HOH. Alert, oriented, speaking in complete sentences. Denies blood thinner use. Grips equal, no drift noted. Neuro exam clear.

## 2017-09-28 NOTE — Discharge Instructions (Addendum)
Please start taking a baby aspirin (81) mg daily.   Return to the ED if you have a headache, sudden and severe headache, confusion, slurred speech, facial droop, weakness or numbness in any arm or leg, extreme fatigue, vision problems, or other symptoms that concern you.

## 2017-09-28 NOTE — ED Notes (Signed)
Patient transported to MRI 

## 2017-09-28 NOTE — ED Notes (Signed)
First Nurse Note: Pt to ED with family who states that pt had sudden onset left sided numbness that started at 1040. Pt was out to breakfast and stood up then started experiencing left sided numbness. Pt states that before this episode she was completely normal. Pt states that numbness lasted about 5 minutes and resolved. Pt feels weak and sleepy.

## 2017-09-28 NOTE — ED Provider Notes (Signed)
[facility] Emergency Department Provider Note   ____________________________________________   First MD Initiated Contact with Patient 09/28/17 1123     (approximate)  I have reviewed the triage vital signs and the nursing notes.   HISTORY  Chief Complaint Numbness    HPI Laura Wilcox is a 84 y.o. female was out with family, at 68 about this morning after leaving a restaurant and walking to the parking lot she began to tell her daughter that she was experiencing a feeling of lightheadedness, and feeling very numb in her left arm.  They really get her to the car, but she see needs some assistance.  When they got her to her apartment she again got up to get out of the car but was not feeling well and began feeling lightheaded as though she felt very weak and was having a hard time holding her head up.  They came here for further evaluation.  At this time the patient reports she feels fine now, she is not having any arm numbness.  She reports all numbness is gone away.  She did not have a headache.  No troubles speaking, and she had some general feeling of weakness but does not remember any specific weakness involving the left arm or left leg or face.  Daughter.  She just seems fatigued, little bit more tired than normal.  She does have a history of polycythemia vera and is under treatment by Dr. Mike Gip.  She has not had any chest pain or trouble breathing.  No fevers or chills.   Past Medical History:  Diagnosis Date  . Polycythemia     Patient Active Problem List   Diagnosis Date Noted  . Goals of care, counseling/discussion 07/09/2017  . Polycythemia rubra vera ([facility]) 06/02/2017  . Underweight 06/02/2017  . Urticaria 06/02/2017  . Advanced care planning/counseling discussion 06/02/2017  . Mild cognitive impairment 06/02/2017    Past Surgical History:  Procedure Laterality Date  . ABDOMINAL HYSTERECTOMY     complete  . BREAST SURGERY        Prior to Admission medications   Medication Sig Start Date End Date Taking? Authorizing Provider  ruxolitinib phosphate (JAKAFI) 10 MG tablet Take 1 tablet (10 mg total) by mouth 2 (two) times daily. 08/06/17   Lequita Asal, MD    Allergies Patient has no known allergies.  Family History  Problem Relation Age of Onset  . Heart disease Father   . Cancer Sister        breast  . Cancer Sister        breast  . Alzheimer's disease Sister     Social History Social History   Tobacco Use  . Smoking status: Former Smoker    Packs/day: 1.00    Years: 30.00    Pack years: 30.00    Last attempt to quit: 04/02/1978    Years since quitting: 39.5  . Smokeless tobacco: Never Used  Substance Use Topics  . Alcohol use: No    Frequency: Never    Comment: occasional glass of wine  . Drug use: No    Review of Systems Constitutional: No fever/chills but some general fatigue, felt lightheaded as though she is in a pass out earlier but symptoms have resolved. Eyes: No visual changes. ENT: No sore throat. Cardiovascular: Denies chest pain. Respiratory: Denies shortness of breath. Gastrointestinal: No abdominal pain.  No nausea, no vomiting.  No diarrhea.  No constipation. Genitourinary: Negative for dysuria. Musculoskeletal: Negative for  back pain. Skin: Negative for rash. Neurological: Negative for headaches, focal weakness or numbness at this time, but did have numbness involving the left arm earlier.    ____________________________________________   PHYSICAL EXAM:  VITAL SIGNS: ED Triage Vitals  Enc Vitals Group     BP 09/28/17 1058 (!) 89/40     Pulse Rate 09/28/17 1058 80     Resp 09/28/17 1058 15     Temp 09/28/17 1058 99.4 F (37.4 C)     Temp Source 09/28/17 1058 Oral     SpO2 09/28/17 1058 98 %     Weight 09/28/17 1100 107 lb (48.5 kg)     Height 09/28/17 1100 5\' 7"  (1.702 m)     Head Circumference --      Peak Flow --      Pain Score 09/28/17 1100 0      Pain Loc --      Pain Edu? --      Excl. in [place]? --     Constitutional: Alert and oriented. Well appearing and in no acute distress. Eyes: Conjunctivae are normal. Head: Atraumatic. Nose: No congestion/rhinnorhea. Mouth/Throat: Mucous membranes are moist. Neck: No stridor.   Cardiovascular: Normal rate, regular rhythm. Grossly normal heart sounds.  Good peripheral circulation. Respiratory: Normal respiratory effort.  No retractions. Lungs CTAB. Gastrointestinal: Soft and nontender. No distention. Musculoskeletal: No lower extremity tenderness nor edema. Neurologic  Detailed neurologic exam performed based on the patient's chief complaint:    The patient has no pronator drift. The patient has normal cranial nerve exam. Extraocular movements are normal. Visual fields are normal. Patient has 5 out of 5 strength in all extremities. There is no numbness or gross, acute sensory abnormality in the extremities bilaterally. No speech disturbance. No dysarthria. No aphasia. No ataxia. Normal finger nose finger bilat. Patient speaking in full and clear sentences.   Skin:  Skin is warm, dry and intact. No rash noted. Psychiatric: Mood and affect are normal. Speech and behavior are normal.  ____________________________________________   LABS (all labs ordered are listed, but only abnormal results are displayed)  Labs Reviewed  BASIC METABOLIC PANEL - Abnormal; Notable for the following components:      Result Value   Glucose, Bld 142 (*)    BUN 28 (*)    Creatinine, Ser 1.13 (*)    GFR calc non Af Amer 42 (*)    GFR calc Af Amer 49 (*)    All other components within normal limits  CBC - Abnormal; Notable for the following components:   RDW 24.8 (*)    All other components within normal limits  URINALYSIS, COMPLETE (UACMP) WITH MICROSCOPIC - Abnormal; Notable for the following components:   Color, Urine STRAW (*)    APPearance CLEAR (*)    Bacteria, UA RARE (*)    All other  components within normal limits  URINE CULTURE  TROPONIN I  CBG MONITORING, ED   ____________________________________________  EKG  Reviewed enterotomy at 11:10 AM Heart rate 89 QRS 90 QTc 430 Normal sinus rhythm, no evidence of acute ischemia denoted. ____________________________________________  RADIOLOGY    MRI result reviewed, negative for acute. ____________________________________________   PROCEDURES  Procedure(s) performed: None  Procedures  Critical Care performed: No  ____________________________________________   INITIAL IMPRESSION / ASSESSMENT AND PLAN / ED COURSE  Pertinent labs & imaging results that were available during my care of the patient were reviewed by me and considered in my medical decision making (see chart for details).  Patient presents for symptoms that sound like a presyncopal episode, but also isolated left arm numbness that is resolved by the time of ER arrival.  Very real on reassuring neurologic exam at this time without evidence of acute stroke, but certainly TIA or very subtle stroke are considered.  The present time, given resolution of symptoms and reassuring neurologic exam we will proceed with MRI of the brain to further evaluate as patient has no ongoing neurologic symptoms to note at present.  In addition we will initiate cardiac, infectious work-up as she did have some mild hypotension on arrival, but now normotensive.  Blood pressure 793 systolic at this time.    Clinical Course as of Sep 29 1447  Sun Sep 28, 2017  1351 Case clinical symptoms, left arm paresthesia, MRI result discussed with Dr. Harlene Ramus of neurology.  He recommends that the patient's current age and symptomatology with a reassuring MRI and resolution of symptoms that he would recommend she be discharged to continue baby aspirin daily and can follow-up with her primary physician and neurology as an outpatient.  I think this is very reasonable, discussed  this plan with patient and her family at the bedside who are all agreeable.  My suspicion overall that she had a TIA is somewhat low, suspect likely more component of some slight dehydration with orthostatic symptoms and near syncope.  No evidence of acute cardiac etiology at this time either.  Remains asymptomatic.   [MQ]    Clinical Course User Index [MQ] Delman Kitten, MD   ----------------------------------------- 2:47 PM on 09/28/2017 -----------------------------------------  Patient reports she feels well.  Has been up and stood, walked without difficulty.  No ongoing symptoms at this time.  I suspect likely mild dehydration with hypotension likely causing a presyncopal episode.  She had been up, active, and reports that she had not been very well-hydrated yet this morning.  Discussed with patient and family careful return precautions, follow-up with primary doctor and neurology.  Patient and family agreeable with plan.  Vitals:   09/28/17 1058 09/28/17 1200  BP: (!) 89/40 (!) 126/56  Pulse: 80 85  Resp: 15 (!) 21  Temp: 99.4 F (37.4 C)   SpO2: 98% 96%     ____________________________________________   FINAL CLINICAL IMPRESSION(S) / ED DIAGNOSES  Final diagnoses:  Mild dehydration  Paresthesia      NEW MEDICATIONS STARTED DURING THIS VISIT:  New Prescriptions   No medications on file     Note:  This document was prepared using Dragon voice recognition software and may include unintentional dictation errors.     Delman Kitten, MD 09/28/17 1450

## 2017-09-28 NOTE — ED Notes (Signed)
Pt ambulatory with steady gait noted

## 2017-09-29 LAB — URINE CULTURE: Special Requests: NORMAL

## 2017-10-01 ENCOUNTER — Other Ambulatory Visit: Payer: Self-pay | Admitting: Hematology and Oncology

## 2017-10-01 ENCOUNTER — Inpatient Hospital Stay: Payer: Medicare Other | Attending: Hematology and Oncology | Admitting: Hematology and Oncology

## 2017-10-01 ENCOUNTER — Inpatient Hospital Stay: Payer: Medicare Other

## 2017-10-01 ENCOUNTER — Encounter: Payer: Self-pay | Admitting: Hematology and Oncology

## 2017-10-01 VITALS — BP 146/75 | HR 76 | Temp 98.5°F | Resp 18 | Wt 108.2 lb

## 2017-10-01 DIAGNOSIS — D45 Polycythemia vera: Secondary | ICD-10-CM | POA: Diagnosis not present

## 2017-10-01 DIAGNOSIS — Z87891 Personal history of nicotine dependence: Secondary | ICD-10-CM | POA: Diagnosis not present

## 2017-10-01 DIAGNOSIS — I10 Essential (primary) hypertension: Secondary | ICD-10-CM | POA: Diagnosis not present

## 2017-10-01 DIAGNOSIS — D7581 Myelofibrosis: Secondary | ICD-10-CM | POA: Diagnosis not present

## 2017-10-01 LAB — COMPREHENSIVE METABOLIC PANEL
ALT: 13 U/L (ref 0–44)
AST: 19 U/L (ref 15–41)
Albumin: 3.9 g/dL (ref 3.5–5.0)
Alkaline Phosphatase: 92 U/L (ref 38–126)
Anion gap: 10 (ref 5–15)
BUN: 26 mg/dL — ABNORMAL HIGH (ref 8–23)
CO2: 24 mmol/L (ref 22–32)
Calcium: 8.8 mg/dL — ABNORMAL LOW (ref 8.9–10.3)
Chloride: 100 mmol/L (ref 98–111)
Creatinine, Ser: 1.08 mg/dL — ABNORMAL HIGH (ref 0.44–1.00)
GFR calc Af Amer: 52 mL/min — ABNORMAL LOW (ref >60)
GFR calc non Af Amer: 44 mL/min — ABNORMAL LOW (ref >60)
Glucose, Bld: 72 mg/dL (ref 70–99)
Potassium: 4.5 mmol/L (ref 3.5–5.1)
Sodium: 134 mmol/L — ABNORMAL LOW (ref 135–145)
Total Bilirubin: 0.5 mg/dL (ref 0.3–1.2)
Total Protein: 7.3 g/dL (ref 6.5–8.1)

## 2017-10-01 LAB — CBC WITH DIFFERENTIAL/PLATELET
Basophils Absolute: 0.1 10*3/uL (ref 0–0.1)
Basophils Relative: 1 %
Eosinophils Absolute: 0.7 10*3/uL (ref 0–0.7)
Eosinophils Relative: 7 %
HCT: 38 % (ref 35.0–47.0)
Hemoglobin: 12.1 g/dL (ref 12.0–16.0)
Lymphocytes Relative: 11 %
Lymphs Abs: 1.1 10*3/uL (ref 1.0–3.6)
MCH: 26.1 pg (ref 26.0–34.0)
MCHC: 31.8 g/dL — ABNORMAL LOW (ref 32.0–36.0)
MCV: 82.1 fL (ref 80.0–100.0)
Monocytes Absolute: 1 10*3/uL — ABNORMAL HIGH (ref 0.2–0.9)
Monocytes Relative: 11 %
Neutro Abs: 6.8 10*3/uL — ABNORMAL HIGH (ref 1.4–6.5)
Neutrophils Relative %: 70 %
Platelets: 307 10*3/uL (ref 150–440)
RBC: 4.63 MIL/uL (ref 3.80–5.20)
RDW: 24.7 % — ABNORMAL HIGH (ref 11.5–14.5)
WBC: 9.6 10*3/uL (ref 3.6–11.0)

## 2017-10-01 LAB — TRIGLYCERIDES: Triglycerides: 146 mg/dL (ref <150)

## 2017-10-01 NOTE — Progress Notes (Signed)
[facility] day:  10/01/2017   Chief Complaint: Laura Wilcox is a 84 y.o. female with polycythemia rubra vera (PV) who is seen for weekly phlebotomy and 1 month assessment on Jakafi.  HPI:  The patient was last seen in the hematology clinic on 09/03/2017.  At that time, she felt good.  Exam was stable.  Hematocrit was 41.9.  Platelet count was 505,000 (improving).  She did not undergo phlebotomy.  CBC on 09/17/2017:  Hematocrit 39.8, hemoglobin 12.7, MCV 81.0, platelets 347,000, WBC 9,200. CBC on 09/28/2017:  Hematocrit 36.8, hemoglobin 12.2, MCV 80.7, platelets 333,000, WBC 9,100.  Patient was seen in the ED on 09/28/2017 for complains of unilateral numbness and dizziness. Patient was found to be HYPOtensive with a blood pressure of 89/40. Patient underwent MRI imaging of her brain that demonstrated normal senescent changes with no evidence of embolic or hemorrhagic stroke. BUN 28 and creatinine 1.13. Patient was hydrated and symptoms fully resolved. She was discharged home with strict return precautions.   During the interim, patient has been doing "great". Patient is doing well today, and does not express any acute concerns. Patient denies B symptoms. She has not experienced any significant interval infections.Patient denies bleeding; no hematochezia, melena, or gross hematuria. She denies areas of unexplained bruising.   Patient advises that she maintains an adequate appetite. She is eating well. Weight today is 108 lb 3.9 oz (49.1 kg), which compared to her last visit to the clinic, represents a  1 pound increase  Patient denies pain in the clinic today.   Past Medical History:  Diagnosis Date  . Polycythemia     Past Surgical History:  Procedure Laterality Date  . ABDOMINAL HYSTERECTOMY     complete  . BREAST SURGERY      Family History  Problem Relation Age of Onset  . Heart disease Father   . Cancer Sister        breast  .  Cancer Sister        breast  . Alzheimer's disease Sister     Social History:  reports that she quit smoking about 39 years ago. She has a 30.00 pack-year smoking history. She has never used smokeless tobacco. She reports that she does not drink alcohol or use drugs.  Former smoker (1/2 pack/day) from age 39s to 49s. She quit smoking in 1980.  She occasionally drinks a glass of wine.  She is widowed.  Patient is retired Sport and exercise psychologist. Patient moved to [facility] in 03/2017.  She lives independently in her own apartment in [place]. Her daughter lives next door.  Patient denies known exposures to radiation on toxins. The patient is accompanied by her daughter today.  Allergies: No Known Allergies  Current Medications: Current Outpatient Medications  Medication Sig Dispense Refill  . ruxolitinib phosphate (JAKAFI) 10 MG tablet Take 1 tablet (10 mg total) by mouth 2 (two) times daily. 60 tablet 1   No current facility-administered medications for this visit.     Review of Systems  Constitutional: Negative.  Negative for chills, diaphoresis, fever, malaise/fatigue and weight loss (weight up 1 pound).  HENT: Positive for hearing loss. Negative for congestion, ear discharge, ear pain, nosebleeds, sinus pain and sore throat.   Eyes: Negative.  Negative for blurred vision, double vision, photophobia, pain, discharge and redness.  Respiratory: Negative.  Negative for cough, hemoptysis, sputum production and shortness of breath.   Cardiovascular: Negative.  Negative for chest pain, palpitations, orthopnea, leg swelling  and PND.  Gastrointestinal: Negative.  Negative for abdominal pain, blood in stool, constipation, diarrhea, melena, nausea and vomiting.  Genitourinary: Negative.  Negative for dysuria, frequency, hematuria and urgency.  Musculoskeletal: Negative.  Negative for back pain, falls, myalgias and neck pain.  Skin: Negative.  Negative for itching and rash.  Neurological: Negative for dizziness, tingling,  sensory change, speech change, focal weakness, weakness and headaches.  Endo/Heme/Allergies: Negative.  Does not bruise/bleed easily.  Psychiatric/Behavioral: Negative for depression and memory loss. The patient is not nervous/anxious and does not have insomnia.   All other systems reviewed and are negative.  Performance status (ECOG): 1-2  Physical Exam: Blood pressure (!) 146/75, pulse 76, temperature 98.5 F (36.9 C), temperature source Tympanic, resp. rate 18, weight 108 lb 3.9 oz (49.1 kg). GENERAL:  Thin elderly woman sitting comfortably in the exam room in no acute distress. MENTAL STATUS:  Alert and oriented to person, place and time. HEAD:  Short dark blonde hair.  Normocephalic, atraumatic, face symmetric, no Cushingoid features. EYES:  Blue eyes.  Pupils equal round and reactive to light and accomodation.  No conjunctivitis or scleral icterus. ENT:  Oropharynx clear without lesion.  Tongue normal. Mucous membranes moist.  RESPIRATORY:  Clear to auscultation without rales, wheezes or rhonchi. CARDIOVASCULAR:  Regular rate and rhythm without murmur, rub or gallop. ABDOMEN:  Soft, non-tender, with active bowel sounds, and no hepatosplenomegaly.  No masses. SKIN:  No rashes, ulcers or lesions. EXTREMITIES: No edema, no skin discoloration or tenderness.  No palpable cords. LYMPH NODES: No palpable cervical, supraclavicular, axillary or inguinal adenopathy  NEUROLOGICAL: Unremarkable. PSYCH:  Appropriate.    Appointment on 10/01/2017  Component Date Value Ref Range Status  . Sodium 10/01/2017 134* 135 - 145 mmol/L Final  . Potassium 10/01/2017 4.5  3.5 - 5.1 mmol/L Final  . Chloride 10/01/2017 100  98 - 111 mmol/L Final   Please note change in reference range.  . CO2 10/01/2017 24  22 - 32 mmol/L Final  . Glucose, Bld 10/01/2017 72  70 - 99 mg/dL Final   Please note change in reference range.  . BUN 10/01/2017 26* 8 - 23 mg/dL Final   Please note change in reference range.  .  Creatinine, Ser 10/01/2017 1.08* 0.44 - 1.00 mg/dL Final  . Calcium 10/01/2017 8.8* 8.9 - 10.3 mg/dL Final  . Total Protein 10/01/2017 7.3  6.5 - 8.1 g/dL Final  . Albumin 10/01/2017 3.9  3.5 - 5.0 g/dL Final  . AST 10/01/2017 19  15 - 41 U/L Final  . ALT 10/01/2017 13  0 - 44 U/L Final   Please note change in reference range.  . Alkaline Phosphatase 10/01/2017 92  38 - 126 U/L Final  . Total Bilirubin 10/01/2017 0.5  0.3 - 1.2 mg/dL Final  . GFR calc non Af Amer 10/01/2017 44* >60 mL/min Final  . GFR calc Af Amer 10/01/2017 52* >60 mL/min Final   Comment: (NOTE) The eGFR has been calculated using the CKD EPI equation. This calculation has not been validated in all clinical situations. eGFR's persistently <60 mL/min signify possible Chronic Kidney Disease.   . Anion gap 10/01/2017 10  5 - 15 Final   Performed at [facility], [address], [place], [place] [address]  . WBC 10/01/2017 9.6  3.6 - 11.0 K/uL Final  . RBC 10/01/2017 4.63  3.80 - 5.20 MIL/uL Final  . Hemoglobin 10/01/2017 12.1  12.0 - 16.0 g/dL Final  . HCT 10/01/2017 38.0  35.0 - 47.0 % Final  . MCV 10/01/2017 82.1  80.0 - 100.0 fL Final  . MCH 10/01/2017 26.1  26.0 - 34.0 pg Final  . MCHC 10/01/2017 31.8* 32.0 - 36.0 g/dL Final  . RDW 10/01/2017 24.7* 11.5 - 14.5 % Final  . Platelets 10/01/2017 307  150 - 440 K/uL Final   Comment: DIFFICULT DRAW RARE PLT CLUMPS PLATELET COUNT CONFIRMED BY SMEAR   . Neutrophils Relative % 10/01/2017 70  % Final  . Neutro Abs 10/01/2017 6.8* 1.4 - 6.5 K/uL Final  . Lymphocytes Relative 10/01/2017 11  % Final  . Lymphs Abs 10/01/2017 1.1  1.0 - 3.6 K/uL Final  . Monocytes Relative 10/01/2017 11  % Final  . Monocytes Absolute 10/01/2017 1.0* 0.2 - 0.9 K/uL Final  . Eosinophils Relative 10/01/2017 7  % Final  . Eosinophils Absolute 10/01/2017 0.7  0 - 0.7 K/uL Final  . Basophils Relative 10/01/2017 1  % Final  . Basophils Absolute 10/01/2017 0.1  0 - 0.1 K/uL Final    Performed at [facility], [address], [place], [place] [address]    Assessment:  Laura Wilcox is a 84 y.o. female with polycythemia rubra vera and secondary myelofibrosis.  She presented in 04/2002 with thrombocytosis (835,000).  JAK2 V617F was positive on 05/20/2012.  Over the past year, hematocrit has ranged 37.5 - 42.4, hemoglobin 12.1 - 13.4, platelets 458,000 - 569,000, WBC 10,500 - 15,700.  Creatinine has been 1.0 - 1.2.  Bone marrow  on 04/15/2002 revealed a normocellular marrow for age with trilineage hematopoiesis and mild megakaryocytic hyperplasia with focal atypia.  There was no morphologic evidence of a myeloproliferative disorder.  Iron stains were inadequate for evaluation of storage iron.  There was no increase in reticulin fibers.  She has been on agrylin since 2012.  Bone marrow on 09/25/2012 revealed a persistent myeloproliferative disorder s/p Agrylin.  The current features were best classified as persistent myeloproliferative neoplasm (MPN) JAK2 V617F mutation positive.  Marrow was hypercellular for age (30-50%) with increased trilineage hematopoiesis and markedly increased atypical megakaryopoiesis with frequent clustering.  There were no increased blasts.  Iron stores were absent.  There was moderate to severe myelofibrosis (grade MF 2-3).  Differential included ET, PV, primary myelofibrosis, and MPN, unclassifiable.  Cytogenetics were normal (59, XX).  She developed pancytopenia in 12/2015.  Etiology is unclear (medication confusion or hydroxyurea + Agrylin).  She received Procrit 40,000 units (intermittently 09/2012 - 06/2016), Venofer (09/2012 - 10/2012), and Infed (12/2015 - 03/2016).  She received Neupogen during a period of neutropenia. She was anagrelide from 2004 - 08/13/2017.  She began [place] on 08/14/2017.  She began a phlebotomy program on 08/06/2017.  She undergoes small volume (150 cc with replacement of 150 cc NS) if her HCT >  42.  Symptomatically, she feels good.  Exam is stable.  Hematocrit is 38.  Platelet count is 307,000. Plan: 1. Labs today:  CBC with diff, CMP, triglycerides. 2. Discussing stable counts. Hematocrit 38.0. No phlebotomy today.  3. Discuss slowly improving platelet count. Count is 307,000 today. Continue Jakafi as prescribed.  4. Discuss ED visit for dehydration. BP was low. Encouraged patient to increase oral fluid intake.  5. RTC monthly for labs (CBC with diff) +/- phlebotomy. 6. RTC in 3 months for MD assessment, labs (CBC with diff, CMP), and +/- phlebotomy   Honor Loh, NP  10/01/2017, 10:37 AM  I saw and evaluated the patient, participating in the key portions of  the service and reviewing pertinent diagnostic studies and records.  I reviewed the nurse practitioner's note and agree with the findings and the plan.  The assessment and plan were discussed with the patient.  Several questions were asked by the patient and her daughter and answered.   Nolon Stalls, MD 10/01/2017,10:37 AM

## 2017-10-01 NOTE — Progress Notes (Signed)
Patient offers no complaints today. 

## 2017-10-06 MED FILL — JAKAFI 10 MG TABLET: 10 | 30 days supply | Qty: 60 | Fill #0

## 2017-10-14 ENCOUNTER — Ambulatory Visit: Payer: Self-pay | Admitting: *Deleted

## 2017-10-14 NOTE — Telephone Encounter (Signed)
Patient's daughter Arrie Aran called and she says "my mother has been coughing and running a fever of 100.4 since last Monday. I checked her temperature today and it was 96. I don't know if my thermometer is working, because she has said she's freezing, then get under covers. Once she sweats, she gets up and says she feels better. Her cough is a hard, cough with rattling in the chest. She will not spit any phlegm out for me to see the color. He cough is better today and she has not troubles breathing." I asked about other symptoms, she says "she has a loss of appetite, but nothing else." According to protocol, see PCP within 24 hours, appointment scheduled for tomorrow at 1300 with Kathrine Haddock, NP, care advice given, patient's daughter verbalized understanding.  Reason for Disposition . Fever present > 3 days (72 hours)  Answer Assessment - Initial Assessment Questions 1. ONSET: "When did the cough begin?"      Last Monday 2. SEVERITY: "How bad is the cough today?"      Maybe a little better today 3. RESPIRATORY DISTRESS: "Describe your breathing."      No problems 4. FEVER: "Do you have a fever?" If so, ask: "What is your temperature, how was it measured, and when did it start?"     Yes, 100.4; she c/o being cold and get under covers 5. SPUTUM: "Describe the color of your sputum" (clear, white, yellow, green)     Unknown, she did not spit in the tissue 6. HEMOPTYSIS: "Are you coughing up any blood?" If so ask: "How much?" (flecks, streaks, tablespoons, etc.)     Unknown 7. CARDIAC HISTORY: "Do you have any history of heart disease?" (e.g., heart attack, congestive heart failure)      No 8. LUNG HISTORY: "Do you have any history of lung disease?"  (e.g., pulmonary embolus, asthma, emphysema)     No 9. PE RISK FACTORS: "Do you have a history of blood clots?" (or: recent major surgery, recent prolonged travel, bedridden)     No 10. OTHER SYMPTOMS: "Do you have any other symptoms?" (e.g.,  runny nose, wheezing, chest pain)       Loss of appetite 11. PREGNANCY: "Is there any chance you are pregnant?" "When was your last menstrual period?"       No 12. TRAVEL: "Have you traveled out of the country in the last month?" (e.g., travel history, exposures)       No  Protocols used: [place]

## 2017-10-15 ENCOUNTER — Ambulatory Visit: Payer: Self-pay | Admitting: Unknown Physician Specialty

## 2017-10-23 ENCOUNTER — Telehealth: Payer: Self-pay | Admitting: Pharmacist

## 2017-10-23 DIAGNOSIS — D45 Polycythemia vera: Secondary | ICD-10-CM

## 2017-10-23 NOTE — Telephone Encounter (Signed)
Oral Chemotherapy Pharmacist Encounter  Follow-Up Form  Called patien's daughter today to follow up regarding patient's oral chemotherapy medication: Jakafi (ruxolitinib)  Original Start date of oral chemotherapy: 07/2017  Pt's daughter that the patient misses 1 dose of Jakafi weekly.  Missed dose(s) attributed to: simply forgetting  Pt reports the following side effects: occasional fatigue, although her daughter believes that so some this my be dehydration related  Recent labs reviewed: CBC from 10/01/17  New medications?: None reported  Other Issues: None reported  Patient knows to call the office with questions or concerns. Oral Oncology Clinic will continue to follow.  Laura Wilcox, PharmD, BCPS, [facility] Clinical Pharmacist [facility] [phone]  10/23/2017 3:05 PM

## 2017-11-05 ENCOUNTER — Inpatient Hospital Stay: Payer: Medicare Other

## 2017-11-05 ENCOUNTER — Inpatient Hospital Stay: Payer: Medicare Other | Attending: Hematology and Oncology

## 2017-11-05 DIAGNOSIS — D45 Polycythemia vera: Secondary | ICD-10-CM | POA: Diagnosis not present

## 2017-11-05 DIAGNOSIS — D7581 Myelofibrosis: Secondary | ICD-10-CM | POA: Diagnosis not present

## 2017-11-05 DIAGNOSIS — D0439 Carcinoma in situ of skin of other parts of face: Secondary | ICD-10-CM | POA: Diagnosis not present

## 2017-11-05 DIAGNOSIS — Z87891 Personal history of nicotine dependence: Secondary | ICD-10-CM | POA: Insufficient documentation

## 2017-11-05 DIAGNOSIS — D485 Neoplasm of uncertain behavior of skin: Secondary | ICD-10-CM | POA: Diagnosis not present

## 2017-11-05 DIAGNOSIS — C44329 Squamous cell carcinoma of skin of other parts of face: Secondary | ICD-10-CM | POA: Diagnosis not present

## 2017-11-05 LAB — CBC WITH DIFFERENTIAL/PLATELET
Basophils Absolute: 0.2 10*3/uL — ABNORMAL HIGH (ref 0–0.1)
Basophils Relative: 2 %
Eosinophils Absolute: 0.5 10*3/uL (ref 0–0.7)
Eosinophils Relative: 5 %
HCT: 31.4 % — ABNORMAL LOW (ref 35.0–47.0)
Hemoglobin: 10 g/dL — ABNORMAL LOW (ref 12.0–16.0)
Lymphocytes Relative: 23 %
Lymphs Abs: 2.5 10*3/uL (ref 1.0–3.6)
MCH: 26.2 pg (ref 26.0–34.0)
MCHC: 31.9 g/dL — ABNORMAL LOW (ref 32.0–36.0)
MCV: 82 fL (ref 80.0–100.0)
Monocytes Absolute: 0.8 10*3/uL (ref 0.2–0.9)
Monocytes Relative: 7 %
Neutro Abs: 7 10*3/uL — ABNORMAL HIGH (ref 1.4–6.5)
Neutrophils Relative %: 63 %
Platelets: 411 10*3/uL (ref 150–440)
RBC: 3.84 MIL/uL (ref 3.80–5.20)
RDW: 24.7 % — ABNORMAL HIGH (ref 11.5–14.5)
WBC: 11 10*3/uL (ref 3.6–11.0)

## 2017-11-17 MED FILL — JAKAFI 10 MG TABLET: 10 | 30 days supply | Qty: 60 | Fill #1

## 2017-12-03 ENCOUNTER — Inpatient Hospital Stay: Payer: Medicare Other | Attending: Hematology and Oncology

## 2017-12-03 ENCOUNTER — Other Ambulatory Visit: Payer: Self-pay | Admitting: Hematology and Oncology

## 2017-12-03 ENCOUNTER — Other Ambulatory Visit: Payer: Self-pay

## 2017-12-03 ENCOUNTER — Encounter: Payer: Self-pay | Admitting: Hematology and Oncology

## 2017-12-03 ENCOUNTER — Inpatient Hospital Stay: Payer: Medicare Other

## 2017-12-03 ENCOUNTER — Inpatient Hospital Stay (HOSPITAL_BASED_OUTPATIENT_CLINIC_OR_DEPARTMENT_OTHER): Payer: Medicare Other | Admitting: Hematology and Oncology

## 2017-12-03 VITALS — BP 145/76 | HR 80 | Temp 96.6°F | Resp 18 | Wt 108.4 lb

## 2017-12-03 DIAGNOSIS — D649 Anemia, unspecified: Secondary | ICD-10-CM | POA: Diagnosis not present

## 2017-12-03 DIAGNOSIS — Z87891 Personal history of nicotine dependence: Secondary | ICD-10-CM

## 2017-12-03 DIAGNOSIS — D45 Polycythemia vera: Secondary | ICD-10-CM | POA: Diagnosis not present

## 2017-12-03 DIAGNOSIS — D7581 Myelofibrosis: Secondary | ICD-10-CM | POA: Diagnosis not present

## 2017-12-03 DIAGNOSIS — T148XXA Other injury of unspecified body region, initial encounter: Secondary | ICD-10-CM

## 2017-12-03 DIAGNOSIS — N289 Disorder of kidney and ureter, unspecified: Secondary | ICD-10-CM | POA: Insufficient documentation

## 2017-12-03 LAB — CBC WITH DIFFERENTIAL/PLATELET
Basophils Absolute: 0.2 10*3/uL — ABNORMAL HIGH (ref 0–0.1)
Basophils Relative: 2 %
Eosinophils Absolute: 0.4 10*3/uL (ref 0–0.7)
Eosinophils Relative: 4 %
HCT: 26.8 % — ABNORMAL LOW (ref 35.0–47.0)
Hemoglobin: 8.6 g/dL — ABNORMAL LOW (ref 12.0–16.0)
Lymphocytes Relative: 20 %
Lymphs Abs: 2.2 10*3/uL (ref 1.0–3.6)
MCH: 27.6 pg (ref 26.0–34.0)
MCHC: 32.3 g/dL (ref 32.0–36.0)
MCV: 85.6 fL (ref 80.0–100.0)
Monocytes Absolute: 1 10*3/uL — ABNORMAL HIGH (ref 0.2–0.9)
Monocytes Relative: 9 %
Neutro Abs: 7.3 10*3/uL — ABNORMAL HIGH (ref 1.4–6.5)
Neutrophils Relative %: 65 %
Platelets: 463 10*3/uL — ABNORMAL HIGH (ref 150–440)
RBC: 3.13 MIL/uL — ABNORMAL LOW (ref 3.80–5.20)
RDW: 27.7 % — ABNORMAL HIGH (ref 11.5–14.5)
WBC: 11.1 10*3/uL — ABNORMAL HIGH (ref 3.6–11.0)

## 2017-12-03 LAB — URINALYSIS, COMPLETE (UACMP) WITH MICROSCOPIC
Bilirubin Urine: NEGATIVE
Glucose, UA: NEGATIVE mg/dL
Hgb urine dipstick: NEGATIVE
Ketones, ur: NEGATIVE mg/dL
Leukocytes, UA: NEGATIVE
Nitrite: NEGATIVE
Specific Gravity, Urine: 1.015 (ref 1.005–1.030)
pH: 6 (ref 5.0–8.0)

## 2017-12-03 LAB — BASIC METABOLIC PANEL
Anion gap: 11 (ref 5–15)
BUN: 38 mg/dL — ABNORMAL HIGH (ref 8–23)
CO2: 23 mmol/L (ref 22–32)
Calcium: 9.3 mg/dL (ref 8.9–10.3)
Chloride: 103 mmol/L (ref 98–111)
Creatinine, Ser: 1.47 mg/dL — ABNORMAL HIGH (ref 0.44–1.00)
GFR calc Af Amer: 35 mL/min — ABNORMAL LOW (ref >60)
GFR calc non Af Amer: 30 mL/min — ABNORMAL LOW (ref >60)
Glucose, Bld: 94 mg/dL (ref 70–99)
Potassium: 5 mmol/L (ref 3.5–5.1)
Sodium: 137 mmol/L (ref 135–145)

## 2017-12-03 LAB — IRON AND TIBC
Iron: 173 ug/dL — ABNORMAL HIGH (ref 28–170)
Saturation Ratios: 63 % — ABNORMAL HIGH (ref 10.4–31.8)
TIBC: 274 ug/dL (ref 250–450)
UIBC: 101 ug/dL

## 2017-12-03 LAB — APTT: aPTT: 30 seconds (ref 24–36)

## 2017-12-03 LAB — PROTIME-INR
INR: 0.92
Prothrombin Time: 12.3 seconds (ref 11.4–15.2)

## 2017-12-03 LAB — FERRITIN: Ferritin: 1020 ng/mL — ABNORMAL HIGH (ref 11–307)

## 2017-12-03 LAB — RETICULOCYTES
RBC.: 3.04 MIL/uL — ABNORMAL LOW (ref 3.80–5.20)
Retic Count, Absolute: 69.9 10*3/uL (ref 19.0–183.0)
Retic Ct Pct: 2.3 % (ref 0.4–3.1)

## 2017-12-03 NOTE — Progress Notes (Signed)
[facility] day:  12/03/2017   Chief Complaint: Laura Wilcox is a 84 y.o. female with polycythemia rubra vera (PV) on [place] who is seen for sick call visit.  HPI:  The patient was last seen in the hematology clinic on 10/01/2017.  At that time, she felt good.  Exam was stable.  Hematocrit was 38.  Platelet count was 307,000.  CBC on 11/05/2017:  Hematocrit 31.4, hemoglobin 10.0, MCV 82.0, platelets 411,000, WBC 11,000. CBC on 12/03/2017:  Hematocrit 26.8, hemoglobin 8.6, MCV 85.6, platelets 463,000, WBC 11,100.  During the interim, she has felt wonderful and ready to shop.  She also notes feeling sleepy and tired.  She has taken her Jakafi daily except 3 days last week.  She denies any bleeding.  She describes bumping into a cabinet and bruising her left shin.  She notes that she bruises easily, but feels that the bruises come up "quick and big".  Two months ago, a baby aspirin was added.  Diet is good.  She denies any nausea, vomiting or diarrhea.   Past Medical History:  Diagnosis Date  . Polycythemia     Past Surgical History:  Procedure Laterality Date  . ABDOMINAL HYSTERECTOMY     complete  . BREAST SURGERY      Family History  Problem Relation Age of Onset  . Heart disease Father   . Cancer Sister        breast  . Cancer Sister        breast  . Alzheimer's disease Sister     Social History:  reports that she quit smoking about 39 years ago. She has a 30.00 pack-year smoking history. She has never used smokeless tobacco. She reports that she does not drink alcohol or use drugs.  Former smoker (1/2 pack/day) from age 60s to 35s. She quit smoking in 1980.  She occasionally drinks a glass of wine.  She is widowed.  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  She lives independently in her own apartment in [place]. Her daughter lives next door.  Patient denies known exposures to radiation on toxins. The patient is  accompanied by her daughter today.  Allergies: No Known Allergies  Current Medications: Current Outpatient Medications  Medication Sig Dispense Refill  . aspirin EC 81 MG tablet Take 81 mg by mouth daily.    Marland Kitchen JAKAFI 10 MG tablet TAKE 1 TABLET (10 MG TOTAL) BY MOUTH 2 TIMES DAILY. 60 tablet 1   No current facility-administered medications for this visit.     Review of Systems  Constitutional: Negative for chills, diaphoresis, fever, malaise/fatigue and weight loss (stable).       Feels wonderful.  Sleepy and tired at times.  HENT: Positive for hearing loss. Negative for congestion, ear discharge, ear pain, nosebleeds, sinus pain and sore throat.   Eyes: Negative.  Negative for blurred vision, double vision, photophobia, pain, discharge and redness.  Respiratory: Negative.  Negative for cough, hemoptysis, sputum production and shortness of breath.   Cardiovascular: Negative.  Negative for chest pain, palpitations, orthopnea, leg swelling and PND.  Gastrointestinal: Negative.  Negative for abdominal pain, blood in stool, constipation, diarrhea, melena, nausea and vomiting.  Genitourinary: Negative.  Negative for dysuria, frequency, hematuria and urgency.  Musculoskeletal: Negative.  Negative for back pain, falls, myalgias and neck pain.  Skin: Negative.  Negative for itching and rash.  Neurological: Negative for dizziness, tingling, sensory change, speech change, focal weakness,  weakness and headaches.  Endo/Heme/Allergies: Bruises/bleeds easily (bruises easily; no bleeding).  Psychiatric/Behavioral: Negative for depression and memory loss. The patient is not nervous/anxious and does not have insomnia.   All other systems reviewed and are negative.  Performance status (ECOG): 1-2  Physical Exam: Blood pressure (!) 145/76, pulse 80, temperature (!) 96.6 F (35.9 C), temperature source Tympanic, resp. rate 18, weight 108 lb 5.7 oz (49.2 kg). GENERAL: Thin elderly woman sitting  comfortably in the exam room in no acute distress. MENTAL STATUS:  Alert and oriented to person, place and time. HEAD:  Short dark blonde hair.  Normocephalic, atraumatic, face symmetric, no Cushingoid features. EYES:  Blue eyes.  Pupils equal round and reactive to light and accomodation.  No conjunctivitis or scleral icterus. ENT:  Oropharynx clear without lesion.  Tongue normal. Mucous membranes moist.  RESPIRATORY:  Clear to auscultation without rales, wheezes or rhonchi. CARDIOVASCULAR:  Regular rate and rhythm without murmur, rub or gallop. ABDOMEN:  Soft, non-tender, with active bowel sounds, and no hepatosplenomegaly.  No masses. SKIN:  No rashes, ulcers or lesions. EXTREMITIES: No edema, no skin discoloration or tenderness.  No palpable cords. LYMPH NODES: No palpable cervical, supraclavicular, axillary or inguinal adenopathy  NEUROLOGICAL: Unremarkable. PSYCH:  Appropriate.    Appointment on 12/03/2017  Component Date Value Ref Range Status  . WBC 12/03/2017 11.1* 3.6 - 11.0 K/uL Final  . RBC 12/03/2017 3.13* 3.80 - 5.20 MIL/uL Final  . Hemoglobin 12/03/2017 8.6* 12.0 - 16.0 g/dL Final  . HCT 12/03/2017 26.8* 35.0 - 47.0 % Final  . MCV 12/03/2017 85.6  80.0 - 100.0 fL Final  . MCH 12/03/2017 27.6  26.0 - 34.0 pg Final  . MCHC 12/03/2017 32.3  32.0 - 36.0 g/dL Final  . RDW 12/03/2017 27.7* 11.5 - 14.5 % Final  . Platelets 12/03/2017 463* 150 - 440 K/uL Final  . Neutrophils Relative % 12/03/2017 65  % Final  . Neutro Abs 12/03/2017 7.3* 1.4 - 6.5 K/uL Final  . Lymphocytes Relative 12/03/2017 20  % Final  . Lymphs Abs 12/03/2017 2.2  1.0 - 3.6 K/uL Final  . Monocytes Relative 12/03/2017 9  % Final  . Monocytes Absolute 12/03/2017 1.0* 0.2 - 0.9 K/uL Final  . Eosinophils Relative 12/03/2017 4  % Final  . Eosinophils Absolute 12/03/2017 0.4  0 - 0.7 K/uL Final  . Basophils Relative 12/03/2017 2  % Final  . Basophils Absolute 12/03/2017 0.2* 0 - 0.1 K/uL Final   Performed at  [facility], [address] [place], [place] [address]  . Sodium 12/03/2017 137  135 - 145 mmol/L Final  . Potassium 12/03/2017 5.0  3.5 - 5.1 mmol/L Final  . Chloride 12/03/2017 103  98 - 111 mmol/L Final  . CO2 12/03/2017 23  22 - 32 mmol/L Final  . Glucose, Bld 12/03/2017 94  70 - 99 mg/dL Final  . BUN 12/03/2017 38* 8 - 23 mg/dL Final  . Creatinine, Ser 12/03/2017 1.47* 0.44 - 1.00 mg/dL Final  . Calcium 12/03/2017 9.3  8.9 - 10.3 mg/dL Final  . GFR calc non Af Amer 12/03/2017 30* >60 mL/min Final  . GFR calc Af Amer 12/03/2017 35* >60 mL/min Final   Comment: (NOTE) The eGFR has been calculated using the CKD EPI equation. This calculation has not been validated in all clinical situations. eGFR's persistently <60 mL/min signify possible Chronic Kidney Disease.   . Anion gap 12/03/2017 11  5 - 15 Final   Performed at [facility], [address], [place], [place] [address]    Assessment:  Gearldene Fiorenza is a 84 y.o. female with polycythemia rubra vera and secondary myelofibrosis.  She presented in 04/2002 with thrombocytosis (835,000).  JAK2 V617F was positive on 05/20/2012.  Over the past year, hematocrit has ranged 37.5 - 42.4, hemoglobin 12.1 - 13.4, platelets 458,000 - 569,000, WBC 10,500 - 15,700.  Creatinine has been 1.0 - 1.2.  Bone marrow  on 04/15/2002 revealed a normocellular marrow for age with trilineage hematopoiesis and mild megakaryocytic hyperplasia with focal atypia.  There was no morphologic evidence of a myeloproliferative disorder.  Iron stains were inadequate for evaluation of storage iron.  There was no increase in reticulin fibers.  She has been on agrylin since 2012.  Bone marrow on 09/25/2012 revealed a persistent myeloproliferative disorder s/p Agrylin.  The current features were best classified as persistent myeloproliferative neoplasm (MPN) JAK2 V617F mutation positive.  Marrow was hypercellular for age (30-50%) with increased  trilineage hematopoiesis and markedly increased atypical megakaryopoiesis with frequent clustering.  There were no increased blasts.  Iron stores were absent.  There was moderate to severe myelofibrosis (grade MF 2-3).  Differential included ET, PV, primary myelofibrosis, and MPN, unclassifiable.  Cytogenetics were normal (42, XX).  She developed pancytopenia in 12/2015.  Etiology is unclear (medication confusion or hydroxyurea + Agrylin).  She received Procrit 40,000 units (intermittently 09/2012 - 06/2016), Venofer (09/2012 - 10/2012), and Infed (12/2015 - 03/2016).  She received Neupogen during a period of neutropenia. She was anagrelide from 2004 - 08/13/2017.  She began [place] on 08/14/2017.  She began a phlebotomy program on 08/06/2017.  She undergoes small volume (150 cc with replacement of 150 cc NS) if her HCT > 42.  Symptomatically, she feels good, but sleepy/tired.  Exam is stable.  Hemoglobin is 8.6.  Platelet count is 463,000.  WBC is 11,100.  Plan: 1. Labs today:  CBC with diff, ferritin, iron studies, retic. 2. Urinalysis.  3. Guaiac cards x 3. 4. Polycythemia rubra vera and myelofibrosis:  Continue Jakafi for now.  Stable counts until recently. 5.   Normocytic anemia:  New unexplained anemia.  Check ferritin iron stores.  Hold baby aspirin.  Rule out any bleeding:  Guaiac stools and urinalysis. 6.   RTC in 1 week for MD assessment and labs (CBC with diff, CMP).   Lequita Asal, MD  12/03/2017, 11:38 AM

## 2017-12-03 NOTE — Progress Notes (Signed)
Pt here for labs noted to have bruising on Left shin area of leg from knee to mid shin. Per pt she "hit my  leg on a cabinet" noticed it 2 days ago- denies pain in left leg area.

## 2017-12-09 DIAGNOSIS — N289 Disorder of kidney and ureter, unspecified: Secondary | ICD-10-CM | POA: Diagnosis not present

## 2017-12-09 DIAGNOSIS — D649 Anemia, unspecified: Secondary | ICD-10-CM | POA: Diagnosis not present

## 2017-12-09 DIAGNOSIS — D7581 Myelofibrosis: Secondary | ICD-10-CM | POA: Diagnosis not present

## 2017-12-09 DIAGNOSIS — D45 Polycythemia vera: Secondary | ICD-10-CM | POA: Diagnosis not present

## 2017-12-09 DIAGNOSIS — Z87891 Personal history of nicotine dependence: Secondary | ICD-10-CM | POA: Diagnosis not present

## 2017-12-10 ENCOUNTER — Inpatient Hospital Stay (HOSPITAL_BASED_OUTPATIENT_CLINIC_OR_DEPARTMENT_OTHER): Payer: Medicare Other | Admitting: Hematology and Oncology

## 2017-12-10 ENCOUNTER — Other Ambulatory Visit: Payer: Self-pay

## 2017-12-10 ENCOUNTER — Encounter: Payer: Self-pay | Admitting: Hematology and Oncology

## 2017-12-10 ENCOUNTER — Inpatient Hospital Stay: Payer: Medicare Other

## 2017-12-10 ENCOUNTER — Other Ambulatory Visit: Payer: Self-pay | Admitting: Hematology and Oncology

## 2017-12-10 VITALS — BP 154/65 | HR 80 | Temp 96.1°F | Resp 18 | Wt 108.4 lb

## 2017-12-10 DIAGNOSIS — Z87891 Personal history of nicotine dependence: Secondary | ICD-10-CM | POA: Diagnosis not present

## 2017-12-10 DIAGNOSIS — D7581 Myelofibrosis: Secondary | ICD-10-CM | POA: Diagnosis not present

## 2017-12-10 DIAGNOSIS — D45 Polycythemia vera: Secondary | ICD-10-CM | POA: Diagnosis not present

## 2017-12-10 DIAGNOSIS — D649 Anemia, unspecified: Secondary | ICD-10-CM

## 2017-12-10 DIAGNOSIS — N289 Disorder of kidney and ureter, unspecified: Secondary | ICD-10-CM

## 2017-12-10 LAB — CBC WITH DIFFERENTIAL/PLATELET
Band Neutrophils: 14 %
Basophils Absolute: 0 10*3/uL (ref 0–0.1)
Basophils Relative: 0 %
Blasts: 0 %
Eosinophils Absolute: 0.3 10*3/uL (ref 0–0.7)
Eosinophils Relative: 2 %
HCT: 26.1 % — ABNORMAL LOW (ref 35.0–47.0)
Hemoglobin: 8.3 g/dL — ABNORMAL LOW (ref 12.0–16.0)
Lymphocytes Relative: 16 %
Lymphs Abs: 2 10*3/uL (ref 1.0–3.6)
MCH: 27.9 pg (ref 26.0–34.0)
MCHC: 31.8 g/dL — ABNORMAL LOW (ref 32.0–36.0)
MCV: 87.8 fL (ref 80.0–100.0)
Metamyelocytes Relative: 2 %
Monocytes Absolute: 0.4 10*3/uL (ref 0.2–0.9)
Monocytes Relative: 3 %
Myelocytes: 0 %
Neutro Abs: 10 10*3/uL — ABNORMAL HIGH (ref 1.4–6.5)
Neutrophils Relative %: 63 %
Other: 0 %
Platelets: 502 10*3/uL — ABNORMAL HIGH (ref 150–440)
Promyelocytes Relative: 0 %
RBC: 2.98 MIL/uL — ABNORMAL LOW (ref 3.80–5.20)
RDW: 28.4 % — ABNORMAL HIGH (ref 11.5–14.5)
WBC: 12.7 10*3/uL — ABNORMAL HIGH (ref 3.6–11.0)
nRBC: 2 /100 WBC — ABNORMAL HIGH

## 2017-12-10 LAB — COMPREHENSIVE METABOLIC PANEL
ALT: 15 U/L (ref 0–44)
AST: 22 U/L (ref 15–41)
Albumin: 4.2 g/dL (ref 3.5–5.0)
Alkaline Phosphatase: 80 U/L (ref 38–126)
Anion gap: 11 (ref 5–15)
BUN: 33 mg/dL — ABNORMAL HIGH (ref 8–23)
CO2: 24 mmol/L (ref 22–32)
Calcium: 9.4 mg/dL (ref 8.9–10.3)
Chloride: 102 mmol/L (ref 98–111)
Creatinine, Ser: 1.41 mg/dL — ABNORMAL HIGH (ref 0.44–1.00)
GFR calc Af Amer: 37 mL/min — ABNORMAL LOW (ref >60)
GFR calc non Af Amer: 32 mL/min — ABNORMAL LOW (ref >60)
Glucose, Bld: 90 mg/dL (ref 70–99)
Potassium: 4.5 mmol/L (ref 3.5–5.1)
Sodium: 137 mmol/L (ref 135–145)
Total Bilirubin: 0.4 mg/dL (ref 0.3–1.2)
Total Protein: 7.3 g/dL (ref 6.5–8.1)

## 2017-12-10 LAB — FOLATE: Folate: 21.6 ng/mL (ref >5.9)

## 2017-12-10 LAB — SAMPLE TO BLOOD BANK

## 2017-12-10 LAB — VITAMIN B12: Vitamin B-12: 871 pg/mL (ref 180–914)

## 2017-12-10 LAB — TSH: TSH: 2.489 u[IU]/mL (ref 0.350–4.500)

## 2017-12-10 LAB — OCCULT BLOOD X 1 CARD TO LAB, STOOL: Fecal Occult Bld: NEGATIVE

## 2017-12-10 LAB — DAT, POLYSPECIFIC AHG (ARMC ONLY): Polyspecific AHG test: NEGATIVE

## 2017-12-10 NOTE — Progress Notes (Signed)
Here for follow up. Per pt and daughter " energy level a little down "

## 2017-12-10 NOTE — Progress Notes (Signed)
[facility] day:  12/10/2017   Chief Complaint: Taleia Sadowski is a 84 y.o. female with polycythemia rubra vera (PV) on Jakafi who is seen for 1 week assessment.  HPI:  The patient was last seen in the hematology clinic on 12/03/2017.  At that time, she was seen for a sick call visit.  She was seen for increased bruising.  Hemoglobin had decreased from 10.0 on 11/05/2017 to 8.6.  Platelet count was 463,000.  WBC was 11,100.  She denied any bleeding.  Additional labs included: Ferritin was 1020.  Iron saturation was 63% with a TIBC of 274.  Retic was 2.3%.  Creatinine was 1.47 (Cr Cl 20.2 ml/min).  Prior creatinine was 0.88 - 1.13.  PT and PTT were normal.  Urinalysis revealed no blood.  Guaiac cards were ordered. One of three cards returned negative on 12/10/2017.  During the interim, patient is sleepy today in clinic. She states, "I am tired, but I can still shop". She presents today with continued bruising. She stopped daily ASA last week following clinic recommendations. Patient continues on her Jakafi as prescribed. Patient denies any chest pain, shortness of breath, and palpitations.   Patient denies that she has experienced any B symptoms. She denies any interval infections. Patient advises that she maintains an adequate appetite. Patient advises that she maintains an adequate appetite. She is eating well. Weight today is 108 lb 5.7 oz (49.2 kg), which compared to her last visit to the clinic, represents a stable weight.   Patient denies pain in the clinic today.   Past Medical History:  Diagnosis Date  . Polycythemia     Past Surgical History:  Procedure Laterality Date  . ABDOMINAL HYSTERECTOMY     complete  . BREAST SURGERY      Family History  Problem Relation Age of Onset  . Heart disease Father   . Cancer Sister        breast  . Cancer Sister        breast  . Alzheimer's disease Sister     Social History:  reports  that she quit smoking about 39 years ago. She has a 30.00 pack-year smoking history. She has never used smokeless tobacco. She reports that she does not drink alcohol or use drugs.  Former smoker (1/2 pack/day) from age 55s to 33s. She quit smoking in 1980.  She occasionally drinks a glass of wine.  She is widowed.  Patient is retired Sport and exercise psychologist. Patient moved to [facility] in 03/2017.  She lives independently in her own apartment in [place]. Her daughter lives next door.  Patient denies known exposures to radiation on toxins. The patient is accompanied by her daughter today.  Allergies: No Known Allergies  Current Medications: Current Outpatient Medications  Medication Sig Dispense Refill  . JAKAFI 10 MG tablet TAKE 1 TABLET (10 MG TOTAL) BY MOUTH 2 TIMES DAILY. 60 tablet 1  . aspirin EC 81 MG tablet Take 81 mg by mouth daily.     No current facility-administered medications for this visit.     Review of Systems  Constitutional: Negative.  Negative for chills, diaphoresis, fever, malaise/fatigue and weight loss (stable).       "I feel fine.  I am tired, but I can still shop".  HENT: Positive for hearing loss. Negative for congestion, ear discharge, ear pain, nosebleeds, sinus pain and sore throat.   Eyes: Negative.  Negative for blurred vision, double vision, photophobia, pain, discharge and  redness.  Respiratory: Negative.  Negative for cough, hemoptysis, sputum production and shortness of breath.   Cardiovascular: Negative.  Negative for chest pain, palpitations, orthopnea, leg swelling and PND.  Gastrointestinal: Negative.  Negative for abdominal pain, blood in stool, constipation, diarrhea, melena, nausea and vomiting.  Genitourinary: Negative.  Negative for dysuria, frequency, hematuria and urgency.  Musculoskeletal: Negative.  Negative for back pain, falls, myalgias and neck pain.  Skin: Negative.  Negative for itching and rash.  Neurological: Negative for dizziness, tingling, sensory change,  speech change, focal weakness, weakness and headaches.  Endo/Heme/Allergies: Bruises/bleeds easily (now off aspirin).  Psychiatric/Behavioral: Negative for depression and memory loss. The patient is not nervous/anxious and does not have insomnia.   All other systems reviewed and are negative.  Performance status (ECOG): 1-2  Physical Exam: Blood pressure (!) 154/65, pulse 80, temperature (!) 96.1 F (35.6 C), temperature source Tympanic, resp. rate 18, weight 108 lb 5.7 oz (49.2 kg). GENERAL:  Thin elderly woman sitting comfortably in the exam room in no acute distress. MENTAL STATUS:  Alert and oriented to person, place and time. HEAD:  Short dark blonde hair.  Normocephalic, atraumatic, face symmetric, no Cushingoid features. EYES:  Blue eyes.  Pupils equal round and reactive to light and accomodation.  No conjunctivitis or scleral icterus. ENT:  Oropharynx clear without lesion.  Tongue normal. Mucous membranes moist.  RESPIRATORY:  Clear to auscultation without rales, wheezes or rhonchi. CARDIOVASCULAR:  Regular rate and rhythm without murmur, rub or gallop. ABDOMEN:  Soft, non-tender, with active bowel sounds, and no hepatosplenomegaly.  No masses. SKIN:  No rashes, ulcers or lesions. EXTREMITIES: No edema, no skin discoloration or tenderness.  No palpable cords. LYMPH NODES: No palpable cervical, supraclavicular, axillary or inguinal adenopathy  NEUROLOGICAL: Unremarkable. PSYCH:  Appropriate.    Office Visit on 12/10/2017  Component Date Value Ref Range Status  . Fecal Occult Bld 12/09/2017 NEGATIVE  NEGATIVE Final   Performed at [facility], [address], [place], [place] [address]  Appointment on 12/10/2017  Component Date Value Ref Range Status  . Sodium 12/10/2017 137  135 - 145 mmol/L Final  . Potassium 12/10/2017 4.5  3.5 - 5.1 mmol/L Final  . Chloride 12/10/2017 102  98 - 111 mmol/L Final  . CO2 12/10/2017 24  22 - 32 mmol/L Final  . Glucose, Bld  12/10/2017 90  70 - 99 mg/dL Final  . BUN 12/10/2017 33* 8 - 23 mg/dL Final  . Creatinine, Ser 12/10/2017 1.41* 0.44 - 1.00 mg/dL Final  . Calcium 12/10/2017 9.4  8.9 - 10.3 mg/dL Final  . Total Protein 12/10/2017 7.3  6.5 - 8.1 g/dL Final  . Albumin 12/10/2017 4.2  3.5 - 5.0 g/dL Final  . AST 12/10/2017 22  15 - 41 U/L Final  . ALT 12/10/2017 15  0 - 44 U/L Final  . Alkaline Phosphatase 12/10/2017 80  38 - 126 U/L Final  . Total Bilirubin 12/10/2017 0.4  0.3 - 1.2 mg/dL Final  . GFR calc non Af Amer 12/10/2017 32* >60 mL/min Final  . GFR calc Af Amer 12/10/2017 37* >60 mL/min Final   Comment: (NOTE) The eGFR has been calculated using the CKD EPI equation. This calculation has not been validated in all clinical situations. eGFR's persistently <60 mL/min signify possible Chronic Kidney Disease.   Georgiann Hahn gap 12/10/2017 11  5 - 15 Final   Performed at [facility], [address] [place], [place] [address]  . WBC  12/10/2017 12.7* 3.6 - 11.0 K/uL Final  . RBC 12/10/2017 2.98* 3.80 - 5.20 MIL/uL Final  . Hemoglobin 12/10/2017 8.3* 12.0 - 16.0 g/dL Final  . HCT 12/10/2017 26.1* 35.0 - 47.0 % Final  . MCV 12/10/2017 87.8  80.0 - 100.0 fL Final  . MCH 12/10/2017 27.9  26.0 - 34.0 pg Final  . MCHC 12/10/2017 31.8* 32.0 - 36.0 g/dL Final  . RDW 12/10/2017 28.4* 11.5 - 14.5 % Final  . Platelets 12/10/2017 502* 150 - 440 K/uL Final   Performed at [facility], [address], [place], [place] [address]  . Neutrophils Relative % 12/10/2017 PENDING  % Incomplete  . Neutro Abs 12/10/2017 PENDING  1.7 - 7.7 K/uL Incomplete  . Band Neutrophils 12/10/2017 PENDING  % Incomplete  . Lymphocytes Relative 12/10/2017 PENDING  % Incomplete  . Lymphs Abs 12/10/2017 PENDING  0.7 - 4.0 K/uL Incomplete  . Monocytes Relative 12/10/2017 PENDING  % Incomplete  . Monocytes Absolute 12/10/2017 PENDING  0.1 - 1.0 K/uL Incomplete  . Eosinophils Relative 12/10/2017 PENDING  % Incomplete   . Eosinophils Absolute 12/10/2017 PENDING  0.0 - 0.7 K/uL Incomplete  . Basophils Relative 12/10/2017 PENDING  % Incomplete  . Basophils Absolute 12/10/2017 PENDING  0.0 - 0.1 K/uL Incomplete  . WBC Morphology 12/10/2017 PENDING   Incomplete  . RBC Morphology 12/10/2017 PENDING   Incomplete  . Smear Review 12/10/2017 PENDING   Incomplete  . Other 12/10/2017 PENDING  % Incomplete  . nRBC 12/10/2017 PENDING  0 /100 WBC Incomplete  . Metamyelocytes Relative 12/10/2017 PENDING  % Incomplete  . Myelocytes 12/10/2017 PENDING  % Incomplete  . Promyelocytes Relative 12/10/2017 PENDING  % Incomplete  . Blasts 12/10/2017 PENDING  % Incomplete    Assessment:  Aamori Wilcox is a 84 y.o. female with polycythemia rubra vera and secondary myelofibrosis.  She presented in 04/2002 with thrombocytosis (835,000).  JAK2 V617F was positive on 05/20/2012.  Over the past year, hematocrit has ranged 37.5 - 42.4, hemoglobin 12.1 - 13.4, platelets 458,000 - 569,000, WBC 10,500 - 15,700.  Creatinine has been 1.0 - 1.2.  Bone marrow  on 04/15/2002 revealed a normocellular marrow for age with trilineage hematopoiesis and mild megakaryocytic hyperplasia with focal atypia.  There was no morphologic evidence of a myeloproliferative disorder.  Iron stains were inadequate for evaluation of storage iron.  There was no increase in reticulin fibers.  She has been on agrylin since 2012.  Bone marrow on 09/25/2012 revealed a persistent myeloproliferative disorder s/p Agrylin.  The current features were best classified as persistent myeloproliferative neoplasm (MPN) JAK2 V617F mutation positive.  Marrow was hypercellular for age (30-50%) with increased trilineage hematopoiesis and markedly increased atypical megakaryopoiesis with frequent clustering.  There were no increased blasts.  Iron stores were absent.  There was moderate to severe myelofibrosis (grade MF 2-3).  Differential included ET, PV, primary myelofibrosis, and MPN,  unclassifiable.  Cytogenetics were normal (88, XX).  She developed pancytopenia in 12/2015.  Etiology is unclear (medication confusion or hydroxyurea + Agrylin).  She received Procrit 40,000 units (intermittently 09/2012 - 06/2016), Venofer (09/2012 - 10/2012), and Infed (12/2015 - 03/2016).  She received Neupogen during a period of neutropenia. She was anagrelide from 2004 - 08/13/2017.  She began [place] on 08/14/2017.  She began a phlebotomy program on 08/06/2017.  She undergoes small volume (150 cc with replacement of 150 cc NS) if her HCT > 42.  She developed a normocytic anemia on 12/10/2017.  Work-up included  a ferritin (1020) with an iron saturation 63% with a TIBC of 274.  Retic was 2.3%.  Creatinine was 1.47 (Cr Cl 20.2 ml/min).  Prior creatinine was 0.88 - 1.13.  Normal studies included:  PT, PTT, urinalysis, and guaiac cards negative x 1.  Symptomatically, she feels "fine", but remains tired.  Exam is stable.  Hemoglobin is 8.3.  Platelets are 502,000.  WBC is 12,700.  Plan: 1. Labs today:  CBC with diff, CMP, B12, folate, TSH, haptoglobin, Coombs. 2. Polycythemia rubra vera:  Hold Jakafi secondary to declining counts and increased creatinine.  Follow counts weekly.  Anticipate restarting Jakafi at a reduced dose once counts recover. 3.  Renal insufficiency:  Patient drinks only 2-3 glasses of fluid/day.  Discuss importance of increased hydration.  Referral to nephrology. 4.  Normocytic anemia:  Suspect etiology is Jakafi with renal insufficiency.  Discuss additional work-up to ensure no other etiology of anemia. 5.  RTC in 1 month for MD assessment and labs (CBC with diff, CMP).   Honor Loh, NP  12/10/2017, 12:19 PM  I saw and evaluated the patient, participating in the key portions of the service and reviewing pertinent diagnostic studies and records.  I reviewed the nurse practitioner's note and agree with the findings and the plan.  The assessment and plan were discussed  with the patient.  Multiple questions were asked by the patient and her daughter and answered.   Nolon Stalls, MD 12/10/2017,12:19 PM

## 2017-12-11 ENCOUNTER — Telehealth: Payer: Self-pay | Admitting: *Deleted

## 2017-12-11 LAB — HAPTOGLOBIN: Haptoglobin: 234 mg/dL — ABNORMAL HIGH (ref 34–200)

## 2017-12-11 NOTE — Telephone Encounter (Signed)
Called patient's daughter and left message with Dr. Elwyn Lade phone number.

## 2017-12-11 NOTE — Telephone Encounter (Signed)
Referral has been sent to Dr. Holley Raring. I would have her contact their office to check on status of appointment.

## 2017-12-11 NOTE — Telephone Encounter (Signed)
Patient's daughter called to report that they have not received an appointment from the kidney doctor.

## 2017-12-15 DIAGNOSIS — D45 Polycythemia vera: Secondary | ICD-10-CM | POA: Diagnosis not present

## 2017-12-15 DIAGNOSIS — N183 Chronic kidney disease, stage 3 (moderate): Secondary | ICD-10-CM | POA: Diagnosis not present

## 2017-12-17 ENCOUNTER — Inpatient Hospital Stay: Payer: Medicare Other

## 2017-12-17 ENCOUNTER — Other Ambulatory Visit: Payer: Medicare Other

## 2017-12-17 ENCOUNTER — Ambulatory Visit: Payer: Medicare Other | Admitting: Hematology and Oncology

## 2017-12-17 DIAGNOSIS — D7581 Myelofibrosis: Secondary | ICD-10-CM | POA: Diagnosis not present

## 2017-12-17 DIAGNOSIS — Z87891 Personal history of nicotine dependence: Secondary | ICD-10-CM | POA: Diagnosis not present

## 2017-12-17 DIAGNOSIS — N289 Disorder of kidney and ureter, unspecified: Secondary | ICD-10-CM | POA: Diagnosis not present

## 2017-12-17 DIAGNOSIS — D45 Polycythemia vera: Secondary | ICD-10-CM

## 2017-12-17 DIAGNOSIS — D649 Anemia, unspecified: Secondary | ICD-10-CM | POA: Diagnosis not present

## 2017-12-17 LAB — CBC WITH DIFFERENTIAL/PLATELET
Basophils Absolute: 0.2 10*3/uL — ABNORMAL HIGH (ref 0–0.1)
Basophils Relative: 2 %
Eosinophils Absolute: 0.3 10*3/uL (ref 0–0.7)
Eosinophils Relative: 3 %
HCT: 24.5 % — ABNORMAL LOW (ref 35.0–47.0)
Hemoglobin: 8.1 g/dL — ABNORMAL LOW (ref 12.0–16.0)
Lymphocytes Relative: 12 %
Lymphs Abs: 1.3 10*3/uL (ref 1.0–3.6)
MCH: 29.6 pg (ref 26.0–34.0)
MCHC: 33.2 g/dL (ref 32.0–36.0)
MCV: 89.1 fL (ref 80.0–100.0)
Monocytes Absolute: 1.2 10*3/uL — ABNORMAL HIGH (ref 0.2–0.9)
Monocytes Relative: 11 %
Neutro Abs: 7.9 10*3/uL — ABNORMAL HIGH (ref 1.4–6.5)
Neutrophils Relative %: 72 %
Platelets: 445 10*3/uL — ABNORMAL HIGH (ref 150–440)
RBC: 2.74 MIL/uL — ABNORMAL LOW (ref 3.80–5.20)
RDW: 28.9 % — ABNORMAL HIGH (ref 11.5–14.5)
WBC: 10.9 10*3/uL (ref 3.6–11.0)

## 2017-12-18 ENCOUNTER — Other Ambulatory Visit: Payer: Self-pay | Admitting: Nephrology

## 2017-12-18 DIAGNOSIS — N183 Chronic kidney disease, stage 3 unspecified: Secondary | ICD-10-CM

## 2017-12-24 ENCOUNTER — Inpatient Hospital Stay: Payer: Medicare Other

## 2017-12-24 ENCOUNTER — Telehealth: Payer: Self-pay | Admitting: *Deleted

## 2017-12-24 DIAGNOSIS — D45 Polycythemia vera: Secondary | ICD-10-CM

## 2017-12-24 DIAGNOSIS — D649 Anemia, unspecified: Secondary | ICD-10-CM | POA: Diagnosis not present

## 2017-12-24 DIAGNOSIS — N289 Disorder of kidney and ureter, unspecified: Secondary | ICD-10-CM | POA: Diagnosis not present

## 2017-12-24 DIAGNOSIS — Z87891 Personal history of nicotine dependence: Secondary | ICD-10-CM | POA: Diagnosis not present

## 2017-12-24 DIAGNOSIS — D7581 Myelofibrosis: Secondary | ICD-10-CM | POA: Diagnosis not present

## 2017-12-24 LAB — CBC WITH DIFFERENTIAL/PLATELET
Basophils Absolute: 0.2 10*3/uL — ABNORMAL HIGH (ref 0–0.1)
Basophils Relative: 2 %
Eosinophils Absolute: 0.4 10*3/uL (ref 0–0.7)
Eosinophils Relative: 4 %
HCT: 26.1 % — ABNORMAL LOW (ref 35.0–47.0)
Hemoglobin: 8.5 g/dL — ABNORMAL LOW (ref 12.0–16.0)
Lymphocytes Relative: 15 %
Lymphs Abs: 1.6 10*3/uL (ref 1.0–3.6)
MCH: 30 pg (ref 26.0–34.0)
MCHC: 32.5 g/dL (ref 32.0–36.0)
MCV: 92.3 fL (ref 80.0–100.0)
Monocytes Absolute: 0.9 10*3/uL (ref 0.2–0.9)
Monocytes Relative: 9 %
Neutro Abs: 7.3 10*3/uL — ABNORMAL HIGH (ref 1.4–6.5)
Neutrophils Relative %: 70 %
Platelets: 527 10*3/uL — ABNORMAL HIGH (ref 150–440)
RBC: 2.82 MIL/uL — ABNORMAL LOW (ref 3.80–5.20)
RDW: 28 % — ABNORMAL HIGH (ref 11.5–14.5)
WBC: 10.5 10*3/uL (ref 3.6–11.0)

## 2017-12-24 NOTE — Telephone Encounter (Signed)
-----   Message from Lequita Asal, MD sent at 12/24/2017 11:35 AM EDT ----- Regarding: Please call patient/daughter  Hemoglobin coming up.  M  ----- Message ----- From: Buel Ream, Lab [facility] [place] Sent: 12/24/2017  11:11 AM EDT To: Lequita Asal, MD

## 2017-12-24 NOTE — Telephone Encounter (Signed)
Called patient's daughter, [name], to inform her that patient's hgb is improving.

## 2017-12-25 ENCOUNTER — Ambulatory Visit
Admission: RE | Admit: 2017-12-25 | Discharge: 2017-12-25 | Disposition: A | Discharge status: Home / Self Care | Payer: Medicare Other | Source: Ambulatory Visit | Attending: Nephrology | Admitting: Nephrology

## 2017-12-25 DIAGNOSIS — N183 Chronic kidney disease, stage 3 unspecified: Secondary | ICD-10-CM

## 2017-12-25 DIAGNOSIS — N281 Cyst of kidney, acquired: Secondary | ICD-10-CM | POA: Insufficient documentation

## 2017-12-31 ENCOUNTER — Other Ambulatory Visit: Payer: Medicare Other

## 2017-12-31 ENCOUNTER — Ambulatory Visit: Payer: Medicare Other | Admitting: Hematology and Oncology

## 2017-12-31 ENCOUNTER — Inpatient Hospital Stay: Payer: Medicare Other | Attending: Hematology and Oncology

## 2017-12-31 DIAGNOSIS — D45 Polycythemia vera: Secondary | ICD-10-CM | POA: Insufficient documentation

## 2017-12-31 DIAGNOSIS — D7581 Myelofibrosis: Secondary | ICD-10-CM | POA: Diagnosis not present

## 2017-12-31 DIAGNOSIS — Z803 Family history of malignant neoplasm of breast: Secondary | ICD-10-CM | POA: Insufficient documentation

## 2017-12-31 DIAGNOSIS — Z7982 Long term (current) use of aspirin: Secondary | ICD-10-CM | POA: Insufficient documentation

## 2017-12-31 DIAGNOSIS — R5383 Other fatigue: Secondary | ICD-10-CM | POA: Diagnosis not present

## 2017-12-31 DIAGNOSIS — Z8249 Family history of ischemic heart disease and other diseases of the circulatory system: Secondary | ICD-10-CM | POA: Diagnosis not present

## 2017-12-31 DIAGNOSIS — Z87891 Personal history of nicotine dependence: Secondary | ICD-10-CM | POA: Insufficient documentation

## 2017-12-31 DIAGNOSIS — D649 Anemia, unspecified: Secondary | ICD-10-CM | POA: Diagnosis not present

## 2017-12-31 DIAGNOSIS — R7989 Other specified abnormal findings of blood chemistry: Secondary | ICD-10-CM | POA: Insufficient documentation

## 2017-12-31 DIAGNOSIS — N289 Disorder of kidney and ureter, unspecified: Secondary | ICD-10-CM | POA: Diagnosis not present

## 2017-12-31 LAB — COMPREHENSIVE METABOLIC PANEL
ALT: 11 U/L (ref 0–44)
AST: 17 U/L (ref 15–41)
Albumin: 3.7 g/dL (ref 3.5–5.0)
Alkaline Phosphatase: 75 U/L (ref 38–126)
Anion gap: 8 (ref 5–15)
BUN: 20 mg/dL (ref 8–23)
CO2: 26 mmol/L (ref 22–32)
Calcium: 8.4 mg/dL — ABNORMAL LOW (ref 8.9–10.3)
Chloride: 99 mmol/L (ref 98–111)
Creatinine, Ser: 1.17 mg/dL — ABNORMAL HIGH (ref 0.44–1.00)
GFR calc Af Amer: 46 mL/min — ABNORMAL LOW (ref >60)
GFR calc non Af Amer: 40 mL/min — ABNORMAL LOW (ref >60)
Glucose, Bld: 111 mg/dL — ABNORMAL HIGH (ref 70–99)
Potassium: 4 mmol/L (ref 3.5–5.1)
Sodium: 133 mmol/L — ABNORMAL LOW (ref 135–145)
Total Bilirubin: 0.6 mg/dL (ref 0.3–1.2)
Total Protein: 6.4 g/dL — ABNORMAL LOW (ref 6.5–8.1)

## 2017-12-31 LAB — CBC WITH DIFFERENTIAL/PLATELET
Basophils Absolute: 0.2 10*3/uL — ABNORMAL HIGH (ref 0–0.1)
Basophils Relative: 2 %
Eosinophils Absolute: 0.5 10*3/uL (ref 0–0.7)
Eosinophils Relative: 4 %
HCT: 25.9 % — ABNORMAL LOW (ref 35.0–47.0)
Hemoglobin: 8.4 g/dL — ABNORMAL LOW (ref 12.0–16.0)
Lymphocytes Relative: 11 %
Lymphs Abs: 1.4 10*3/uL (ref 1.0–3.6)
MCH: 30.5 pg (ref 26.0–34.0)
MCHC: 32.2 g/dL (ref 32.0–36.0)
MCV: 94.5 fL (ref 80.0–100.0)
Monocytes Absolute: 1 10*3/uL — ABNORMAL HIGH (ref 0.2–0.9)
Monocytes Relative: 8 %
Neutro Abs: 9.2 10*3/uL — ABNORMAL HIGH (ref 1.4–6.5)
Neutrophils Relative %: 75 %
Platelets: 622 10*3/uL — ABNORMAL HIGH (ref 150–440)
RBC: 2.74 MIL/uL — ABNORMAL LOW (ref 3.80–5.20)
RDW: 27.5 % — ABNORMAL HIGH (ref 11.5–14.5)
WBC: 12.2 10*3/uL — ABNORMAL HIGH (ref 3.6–11.0)

## 2018-01-07 ENCOUNTER — Inpatient Hospital Stay: Payer: Medicare Other

## 2018-01-07 ENCOUNTER — Inpatient Hospital Stay (HOSPITAL_BASED_OUTPATIENT_CLINIC_OR_DEPARTMENT_OTHER): Payer: Medicare Other | Admitting: Hematology and Oncology

## 2018-01-07 ENCOUNTER — Encounter: Payer: Self-pay | Admitting: Hematology and Oncology

## 2018-01-07 VITALS — BP 127/63 | HR 89 | Temp 97.0°F | Resp 18 | Ht 67.0 in | Wt 111.0 lb

## 2018-01-07 DIAGNOSIS — Z8249 Family history of ischemic heart disease and other diseases of the circulatory system: Secondary | ICD-10-CM

## 2018-01-07 DIAGNOSIS — D649 Anemia, unspecified: Secondary | ICD-10-CM

## 2018-01-07 DIAGNOSIS — R7989 Other specified abnormal findings of blood chemistry: Secondary | ICD-10-CM

## 2018-01-07 DIAGNOSIS — D45 Polycythemia vera: Secondary | ICD-10-CM | POA: Diagnosis not present

## 2018-01-07 DIAGNOSIS — D7581 Myelofibrosis: Secondary | ICD-10-CM

## 2018-01-07 DIAGNOSIS — R5383 Other fatigue: Secondary | ICD-10-CM | POA: Diagnosis not present

## 2018-01-07 DIAGNOSIS — Z87891 Personal history of nicotine dependence: Secondary | ICD-10-CM | POA: Diagnosis not present

## 2018-01-07 DIAGNOSIS — Z7982 Long term (current) use of aspirin: Secondary | ICD-10-CM | POA: Diagnosis not present

## 2018-01-07 DIAGNOSIS — N289 Disorder of kidney and ureter, unspecified: Secondary | ICD-10-CM

## 2018-01-07 DIAGNOSIS — Z803 Family history of malignant neoplasm of breast: Secondary | ICD-10-CM | POA: Diagnosis not present

## 2018-01-07 LAB — CBC WITH DIFFERENTIAL/PLATELET
Abs Immature Granulocytes: 0.69 10*3/uL — ABNORMAL HIGH (ref 0.00–0.07)
Basophils Absolute: 0.2 10*3/uL — ABNORMAL HIGH (ref 0.0–0.1)
Basophils Relative: 1 %
Eosinophils Absolute: 0.7 10*3/uL — ABNORMAL HIGH (ref 0.0–0.5)
Eosinophils Relative: 4 %
HCT: 31.7 % — ABNORMAL LOW (ref 36.0–46.0)
Hemoglobin: 9.5 g/dL — ABNORMAL LOW (ref 12.0–15.0)
Immature Granulocytes: 4 %
Lymphocytes Relative: 14 %
Lymphs Abs: 2.3 10*3/uL (ref 0.7–4.0)
MCH: 29.8 pg (ref 26.0–34.0)
MCHC: 30 g/dL (ref 30.0–36.0)
MCV: 99.4 fL (ref 80.0–100.0)
Monocytes Absolute: 1.2 10*3/uL — ABNORMAL HIGH (ref 0.1–1.0)
Monocytes Relative: 7 %
Neutro Abs: 11.3 10*3/uL — ABNORMAL HIGH (ref 1.7–7.7)
Neutrophils Relative %: 70 %
Platelets: 811 10*3/uL — ABNORMAL HIGH (ref 150–400)
RBC: 3.19 MIL/uL — ABNORMAL LOW (ref 3.87–5.11)
RDW: 25.3 % — ABNORMAL HIGH (ref 11.5–15.5)
WBC: 16.3 10*3/uL — ABNORMAL HIGH (ref 4.0–10.5)
nRBC: 0.2 % (ref 0.0–0.2)

## 2018-01-07 LAB — COMPREHENSIVE METABOLIC PANEL
ALT: 11 U/L (ref 0–44)
AST: 19 U/L (ref 15–41)
Albumin: 4.1 g/dL (ref 3.5–5.0)
Alkaline Phosphatase: 87 U/L (ref 38–126)
Anion gap: 13 (ref 5–15)
BUN: 22 mg/dL (ref 8–23)
CO2: 25 mmol/L (ref 22–32)
Calcium: 9 mg/dL (ref 8.9–10.3)
Chloride: 101 mmol/L (ref 98–111)
Creatinine, Ser: 1.26 mg/dL — ABNORMAL HIGH (ref 0.44–1.00)
GFR calc Af Amer: 42 mL/min — ABNORMAL LOW (ref >60)
GFR calc non Af Amer: 37 mL/min — ABNORMAL LOW (ref >60)
Glucose, Bld: 137 mg/dL — ABNORMAL HIGH (ref 70–99)
Potassium: 4.6 mmol/L (ref 3.5–5.1)
Sodium: 139 mmol/L (ref 135–145)
Total Bilirubin: 1 mg/dL (ref 0.3–1.2)
Total Protein: 7.3 g/dL (ref 6.5–8.1)

## 2018-01-07 MED ORDER — RUXOLITINIB PHOSPHATE 5 MG PO TABS: 5.0000 mg | ORAL_TABLET | Freq: Two times a day (BID) | ORAL | 0 refills | Status: DC

## 2018-01-07 NOTE — Progress Notes (Signed)
No new changes noted today 

## 2018-01-07 NOTE — Progress Notes (Signed)
[facility] day:  01/07/2018   Chief Complaint: Laura Wilcox is a 84 y.o. female with polycythemia rubra vera (PV) on Jakafi who is seen for 1 month assessment.  HPI:  The patient was last seen in the hematology clinic on 12/10/2017.  At that time, she felt "fine but tired".  Exam was stable.  Hemoglobin was 8.3.  Platelets were 502,000, WBC was 12,700.  Creatinine had increased to 1.41 (CrCl 20 ml/min).  Fluid intake was poor.  We discussed increased hydration and referral to nephrology.  [name] was held.  She underwent additional work-up of her anemia. B12 was 871.  Folate was 21.6. Coombs was negative. Haptoglobin was 234.  TSH was 2.489.  She has had weekly CBCs: 12/17/2017:  Hematocrit 24.5, hemoglobin 8.1, MCV 89.1, platelets 445,000, WBC 10,900 with an ANC of 7900. 12/24/2017:  Hematocrit 26.1, hemoglobin 8.5, MCV 92.3, platelets 527,000, WBC 10,500 with an ANC of 7300. 12/31/2017:  Hematocrit 25.9, hemoglobin 8.4, MCV 94.5, platelets 622,000, WBC 12,200 with an ANC of 9200.  During the interim, she has felt about the same.  She remains tired.  She denies any bleeding.  She is trying to drink more fluid.  Weight is up 3 pounds.  She has an appointment with the nephrologist on 01/12/2018.   Past Medical History:  Diagnosis Date  . Polycythemia     Past Surgical History:  Procedure Laterality Date  . ABDOMINAL HYSTERECTOMY     complete  . BREAST SURGERY      Family History  Problem Relation Age of Onset  . Heart disease Father   . Cancer Sister        breast  . Cancer Sister        breast  . Alzheimer's disease Sister     Social History:  reports that she quit smoking about 39 years ago. She has a 30.00 pack-year smoking history. She has never used smokeless tobacco. She reports that she does not drink alcohol or use drugs.  Former smoker (1/2 pack/day) from age 6s to 5s. She quit smoking in 1980.  She occasionally drinks  a glass of wine.  She is widowed.  Patient is retired Sport and exercise psychologist. Patient moved to [facility] in 03/2017.  She lives independently in her own apartment in [place]. Her daughter lives next door.  Patient denies known exposures to radiation on toxins. The patient is accompanied by her daughter today.  Allergies: No Known Allergies  Current Medications: Current Outpatient Medications  Medication Sig Dispense Refill  . aspirin EC 81 MG tablet Take 81 mg by mouth daily.    . ruxolitinib phosphate (JAKAFI) 5 MG tablet Take 1 tablet (5 mg total) by mouth 2 (two) times daily. 60 tablet 0   No current facility-administered medications for this visit.     Review of Systems  Constitutional: Negative for chills, diaphoresis, fever, malaise/fatigue and weight loss (up 3 pounds).       Feels the same.  No increased fatigue.  HENT: Positive for hearing loss. Negative for congestion, ear discharge, ear pain, nosebleeds, sinus pain and sore throat.   Eyes: Negative.  Negative for blurred vision, double vision, photophobia, pain, discharge and redness.  Respiratory: Negative.  Negative for cough, hemoptysis, sputum production and shortness of breath.   Cardiovascular: Negative.  Negative for chest pain, palpitations, orthopnea, leg swelling and PND.  Gastrointestinal: Negative.  Negative for abdominal pain, blood in stool, constipation, diarrhea, melena, nausea and vomiting.  Genitourinary: Negative.  Negative for dysuria, frequency, hematuria and urgency.       Trying to dink more fluid.  Musculoskeletal: Negative.  Negative for back pain, joint pain, myalgias and neck pain.  Skin: Negative.  Negative for itching and rash.  Neurological: Negative.  Negative for dizziness, tingling, tremors, sensory change, focal weakness, weakness and headaches.  Endo/Heme/Allergies: Bruises/bleeds easily.  Psychiatric/Behavioral: Negative for depression and memory loss. The patient is not nervous/anxious and does not have insomnia.    All other systems reviewed and are negative.  Performance status (ECOG): 1-2  Vital Signs BP 127/63 (BP Location: Left Arm, Patient Position: Sitting)   Pulse 89   Temp (!) 97 F (36.1 C) (Tympanic)   Resp 18   Ht _0  (1.702 m)   Wt 111 lb (50.3 kg)   BMI 17.39 kg/m   Physical Exam  Constitutional: She is oriented to person, place, and time and well-developed, well-nourished, and in no distress. No distress.  HENT:  Head: Normocephalic and atraumatic.  Mouth/Throat: Oropharynx is clear and moist and mucous membranes are normal. No oropharyngeal exudate.  Short blonde hair.  Eyes: Pupils are equal, round, and reactive to light. EOM are normal. No scleral icterus.  Blue eyes.   Neck: Normal range of motion. Neck supple. No JVD present.  Cardiovascular: Normal rate, regular rhythm, normal heart sounds and intact distal pulses. Exam reveals no gallop and no friction rub.  No murmur heard. Pulmonary/Chest: Effort normal and breath sounds normal. No respiratory distress. She has no wheezes. She has no rales.  Abdominal: Soft. Bowel sounds are normal. She exhibits no distension and no mass. There is no tenderness. There is no rebound and no guarding.  Musculoskeletal: Normal range of motion. She exhibits no edema or tenderness.  Lymphadenopathy:    She has no cervical adenopathy.  Neurological: She is alert and oriented to person, place, and time.  Skin: Skin is warm and dry. No rash noted. She is not diaphoretic. No erythema.  Psychiatric: Mood, affect and judgment normal.  More alert and interactive.  Nursing note and vitals reviewed.   Appointment on 01/07/2018  Component Date Value Ref Range Status  . Sodium 01/07/2018 139  135 - 145 mmol/L Final  . Potassium 01/07/2018 4.6  3.5 - 5.1 mmol/L Final  . Chloride 01/07/2018 101  98 - 111 mmol/L Final  . CO2 01/07/2018 25  22 - 32 mmol/L Final  . Glucose, Bld 01/07/2018 137* 70 - 99 mg/dL Final  . BUN 01/07/2018 22  8 - 23  mg/dL Final  . Creatinine, Ser 01/07/2018 1.26* 0.44 - 1.00 mg/dL Final  . Calcium 01/07/2018 9.0  8.9 - 10.3 mg/dL Final  . Total Protein 01/07/2018 7.3  6.5 - 8.1 g/dL Final  . Albumin 01/07/2018 4.1  3.5 - 5.0 g/dL Final  . AST 01/07/2018 19  15 - 41 U/L Final  . ALT 01/07/2018 11  0 - 44 U/L Final  . Alkaline Phosphatase 01/07/2018 87  38 - 126 U/L Final  . Total Bilirubin 01/07/2018 1.0  0.3 - 1.2 mg/dL Final  . GFR calc non Af Amer 01/07/2018 37* >60 mL/min Final  . GFR calc Af Amer 01/07/2018 42* >60 mL/min Final   Comment: (NOTE) The eGFR has been calculated using the CKD EPI equation. This calculation has not been validated in all clinical situations. eGFR's persistently <60 mL/min signify possible Chronic Kidney Disease.   . Anion gap 01/07/2018 13  5 - 15 Final  Performed at [facility], [address] [place], [place] [address]  . WBC 01/07/2018 16.3* 4.0 - 10.5 K/uL Final  . RBC 01/07/2018 3.19* 3.87 - 5.11 MIL/uL Final  . Hemoglobin 01/07/2018 9.5* 12.0 - 15.0 g/dL Final  . HCT 01/07/2018 31.7* 36.0 - 46.0 % Final  . MCV 01/07/2018 99.4  80.0 - 100.0 fL Final  . MCH 01/07/2018 29.8  26.0 - 34.0 pg Final  . MCHC 01/07/2018 30.0  30.0 - 36.0 g/dL Final  . RDW 01/07/2018 25.3* 11.5 - 15.5 % Final  . Platelets 01/07/2018 811* 150 - 400 K/uL Final  . nRBC 01/07/2018 0.2  0.0 - 0.2 % Final  . Neutrophils Relative % 01/07/2018 70  % Final  . Neutro Abs 01/07/2018 11.3* 1.7 - 7.7 K/uL Final  . Lymphocytes Relative 01/07/2018 14  % Final  . Lymphs Abs 01/07/2018 2.3  0.7 - 4.0 K/uL Final  . Monocytes Relative 01/07/2018 7  % Final  . Monocytes Absolute 01/07/2018 1.2* 0.1 - 1.0 K/uL Final  . Eosinophils Relative 01/07/2018 4  % Final  . Eosinophils Absolute 01/07/2018 0.7* 0.0 - 0.5 K/uL Final  . Basophils Relative 01/07/2018 1  % Final  . Basophils Absolute 01/07/2018 0.2* 0.0 - 0.1 K/uL Final  . Immature Granulocytes 01/07/2018 4  % Final  . Abs Immature  Granulocytes 01/07/2018 0.69* 0.00 - 0.07 K/uL Final   Performed at [facility], [address] [place], [place] [address]    Assessment:  Laura Wilcox is a 84 y.o. female with polycythemia rubra vera and secondary myelofibrosis.  She presented in 04/2002 with thrombocytosis (835,000).  JAK2 V617F was positive on 05/20/2012.  Over the past year, hematocrit has ranged 37.5 - 42.4, hemoglobin 12.1 - 13.4, platelets 458,000 - 569,000, WBC 10,500 - 15,700.  Creatinine has been 1.0 - 1.2.  Bone marrow  on 04/15/2002 revealed a normocellular marrow for age with trilineage hematopoiesis and mild megakaryocytic hyperplasia with focal atypia.  There was no morphologic evidence of a myeloproliferative disorder.  Iron stains were inadequate for evaluation of storage iron.  There was no increase in reticulin fibers.  She has been on agrylin since 2012.  Bone marrow on 09/25/2012 revealed a persistent myeloproliferative disorder s/p Agrylin.  The current features were best classified as persistent myeloproliferative neoplasm (MPN) JAK2 V617F mutation positive.  Marrow was hypercellular for age (30-50%) with increased trilineage hematopoiesis and markedly increased atypical megakaryopoiesis with frequent clustering.  There were no increased blasts.  Iron stores were absent.  There was moderate to severe myelofibrosis (grade MF 2-3).  Differential included ET, PV, primary myelofibrosis, and MPN, unclassifiable.  Cytogenetics were normal (69, XX).  She developed pancytopenia in 12/2015.  Etiology is unclear (medication confusion or hydroxyurea + Agrylin).  She received Procrit 40,000 units (intermittently 09/2012 - 06/2016), Venofer (09/2012 - 10/2012), and Infed (12/2015 - 03/2016).  She received Neupogen during a period of neutropenia. She was anagrelide from 2004 - 08/13/2017.  She began [place] on 08/14/2017.  She began a phlebotomy program on 08/06/2017.  She undergoes small volume (150 cc  with replacement of 150 cc NS) if her HCT > 42.  She developed a normocytic anemia on 12/03/2017.  Work-up included a ferritin (1020) with an iron saturation 63% with a TIBC of 274.  Retic was 2.3%.  Creatinine was 1.47 (Cr Cl 20.2 ml/min).  Prior creatinine was 0.88 - 1.13.  Normal studies included:  B12, folate, TSH, Coombs, haptoglobin, PT,  PTT, urinalysis, and guaiac cards negative x 1.  Symptomatically, she remains fatigued.  Exam is grossly unremarkable.  WBC is 16,300 ([place] 11,300).  Hemoglobin 9.5, hematocrit 31.7, and platelets 811,000.  BUN 22 and creatinine 1.26 (CrCl 24 mL/min).   Plan: 1. Labs today:  CBC with diff, CMP. 2. Polycythemia rubra vera with myelofibrosis:  Labs reviewed. Hemoglobin 9.5, hematocrit 31.7, and platelets 811,000.    No small-volume therapeutic phlebotomy today (receives phlebotomy if hematocrit > 42). Discuss climbing platelet count and need for re-initiation of Jakafi therapy. Due to decreased renal function, will restart Jakafi at 5 mg BID dose (previously 10 mg BID).  3. Normocytic anemia:  Work-up negative (review labs from 12/03/2017 and 12/10/2017).  Etiology felt secondary to [facility].  Planned decreased Jakafi dose. 4. Renal insufficiency:  Discussed importance of increased fluid intake.  Fluid goal is 5 glasses a day.  Follow-up with nephrology on 01/12/2018. 5. Elevated ferritin and iron saturation:  Ferritin was 529 on 09/03/2017 and 1020 on 12/03/2017.  Suspect acute phase reactant, although iron saturation was high (63%).  Check ferritin and iron sats in 1 month.  If iron sats remain high, consider hemochromatosis evaluation. 6. RTC in 2 weeks for MD assessment and labs (CBC with differential, CMP).   Honor Loh, NP  01/07/2018, 2:48 PM  I saw and evaluated the patient, participating in the key portions of the service and reviewing pertinent diagnostic studies and records.  I reviewed the nurse practitioner's note and agree with the  findings and the plan.  The assessment and plan were discussed with the patient.  Multiple questions were asked by the patient and her daughter and answered.   Nolon Stalls, MD 01/07/2018,2:48 PM

## 2018-01-08 MED FILL — JAKAFI 5 MG TABLET: 5 | 30 days supply | Qty: 60 | Fill #0

## 2018-01-12 DIAGNOSIS — N179 Acute kidney failure, unspecified: Secondary | ICD-10-CM | POA: Diagnosis not present

## 2018-01-12 DIAGNOSIS — D45 Polycythemia vera: Secondary | ICD-10-CM | POA: Diagnosis not present

## 2018-01-12 DIAGNOSIS — R809 Proteinuria, unspecified: Secondary | ICD-10-CM | POA: Diagnosis not present

## 2018-01-12 DIAGNOSIS — N183 Chronic kidney disease, stage 3 (moderate): Secondary | ICD-10-CM | POA: Diagnosis not present

## 2018-01-15 ENCOUNTER — Telehealth: Payer: Self-pay | Admitting: Pharmacist

## 2018-01-15 NOTE — Telephone Encounter (Signed)
Oral Chemotherapy Pharmacist Encounter   Called patient's daughter for [facility] follow-up. Daughter was busy at the time of call. Told her I would call again tomorrow.  Darl Pikes, PharmD, BCPS, [facility] Hematology/Oncology Clinical Pharmacist [facility] [phone]  01/15/2018 3:09 PM

## 2018-01-16 NOTE — Telephone Encounter (Signed)
Oral Chemotherapy Pharmacist Encounter  Follow-Up Form  Called patient's daughter Laura Wilcox today to follow up regarding patient's oral chemotherapy medication: Laura Wilcox  Original Start date of oral chemotherapy: 07/2017  Laura Wilcox reports 0 tablets/doses of Jakafi missed restarting her Jakafi at 5mg  bid   Laura Wilcox reports the following side effects: None reported  Recent labs reviewed: CBC from 01/07/18   New medications?: None reported  Other Issues: None reported  Laura Wilcox knows to call the office with questions or concerns. Oral Oncology Clinic will continue to follow.  Darl Pikes, PharmD, BCPS, [facility] Hematology/Oncology Clinical Pharmacist [facility] [phone]  01/16/2018 9:54 AM

## 2018-01-19 DIAGNOSIS — Z23 Encounter for immunization: Secondary | ICD-10-CM | POA: Diagnosis not present

## 2018-01-21 ENCOUNTER — Inpatient Hospital Stay (HOSPITAL_BASED_OUTPATIENT_CLINIC_OR_DEPARTMENT_OTHER): Payer: Medicare Other | Admitting: Urgent Care

## 2018-01-21 ENCOUNTER — Inpatient Hospital Stay: Payer: Medicare Other

## 2018-01-21 VITALS — BP 105/62 | HR 88 | Temp 96.4°F | Resp 18 | Wt 109.3 lb

## 2018-01-21 DIAGNOSIS — R5383 Other fatigue: Secondary | ICD-10-CM

## 2018-01-21 DIAGNOSIS — N289 Disorder of kidney and ureter, unspecified: Secondary | ICD-10-CM

## 2018-01-21 DIAGNOSIS — Z8249 Family history of ischemic heart disease and other diseases of the circulatory system: Secondary | ICD-10-CM | POA: Diagnosis not present

## 2018-01-21 DIAGNOSIS — D45 Polycythemia vera: Secondary | ICD-10-CM

## 2018-01-21 DIAGNOSIS — Z803 Family history of malignant neoplasm of breast: Secondary | ICD-10-CM | POA: Diagnosis not present

## 2018-01-21 DIAGNOSIS — Z7982 Long term (current) use of aspirin: Secondary | ICD-10-CM | POA: Diagnosis not present

## 2018-01-21 DIAGNOSIS — Z87891 Personal history of nicotine dependence: Secondary | ICD-10-CM | POA: Diagnosis not present

## 2018-01-21 LAB — CBC WITH DIFFERENTIAL/PLATELET
Abs Immature Granulocytes: 0.38 10*3/uL — ABNORMAL HIGH (ref 0.00–0.07)
Basophils Absolute: 0.2 10*3/uL — ABNORMAL HIGH (ref 0.0–0.1)
Basophils Relative: 2 %
Eosinophils Absolute: 0.7 10*3/uL — ABNORMAL HIGH (ref 0.0–0.5)
Eosinophils Relative: 6 %
HCT: 32.4 % — ABNORMAL LOW (ref 36.0–46.0)
Hemoglobin: 10.1 g/dL — ABNORMAL LOW (ref 12.0–15.0)
Immature Granulocytes: 3 %
Lymphocytes Relative: 15 %
Lymphs Abs: 2 10*3/uL (ref 0.7–4.0)
MCH: 30.9 pg (ref 26.0–34.0)
MCHC: 31.2 g/dL (ref 30.0–36.0)
MCV: 99.1 fL (ref 80.0–100.0)
Monocytes Absolute: 0.9 10*3/uL (ref 0.1–1.0)
Monocytes Relative: 7 %
Neutro Abs: 8.6 10*3/uL — ABNORMAL HIGH (ref 1.7–7.7)
Neutrophils Relative %: 67 %
Platelets: 770 10*3/uL — ABNORMAL HIGH (ref 150–400)
RBC: 3.27 MIL/uL — ABNORMAL LOW (ref 3.87–5.11)
RDW: 22 % — ABNORMAL HIGH (ref 11.5–15.5)
WBC: 12.8 10*3/uL — ABNORMAL HIGH (ref 4.0–10.5)
nRBC: 0 % (ref 0.0–0.2)

## 2018-01-21 LAB — COMPREHENSIVE METABOLIC PANEL
ALT: 12 U/L (ref 0–44)
AST: 22 U/L (ref 15–41)
Albumin: 4.3 g/dL (ref 3.5–5.0)
Alkaline Phosphatase: 79 U/L (ref 38–126)
Anion gap: 10 (ref 5–15)
BUN: 27 mg/dL — ABNORMAL HIGH (ref 8–23)
CO2: 25 mmol/L (ref 22–32)
Calcium: 9 mg/dL (ref 8.9–10.3)
Chloride: 100 mmol/L (ref 98–111)
Creatinine, Ser: 1.16 mg/dL — ABNORMAL HIGH (ref 0.44–1.00)
GFR calc Af Amer: 47 mL/min — ABNORMAL LOW (ref >60)
GFR calc non Af Amer: 40 mL/min — ABNORMAL LOW (ref >60)
Glucose, Bld: 137 mg/dL — ABNORMAL HIGH (ref 70–99)
Potassium: 4.8 mmol/L (ref 3.5–5.1)
Sodium: 135 mmol/L (ref 135–145)
Total Bilirubin: 0.1 mg/dL — ABNORMAL LOW (ref 0.3–1.2)
Total Protein: 7.2 g/dL (ref 6.5–8.1)

## 2018-01-21 NOTE — Progress Notes (Signed)
Patient offers no concerns today.  She is no longer taking Aspirin.

## 2018-01-21 NOTE — Progress Notes (Signed)
[facility] day:  01/21/2018   Chief Complaint: Laura Wilcox is a 84 y.o. female with polycythemia rubra vera (PV) on Jakafi who is seen for a 2 week assessment on Jakafi.    HPI:  The patient was last seen in the hematology clinic on 01/07/2018.  At that time, patient planing of mild fatigue.  Persistent bruising.  She had stopped ASA last week following clinic recommendation.  Patient remained off Jakafi due to renal function.  Appetite stable; weight loss.  This grossly unremarkable.  WBC 16,300 (ANC 11,300).  Hemoglobin 9.5, hematocrit 31.7, and platelets 811,000.  BUN 22 and creatinine 1.26 (CrCl 24 mL/min).   Due to decreased renal function, patient was restarted on Jakafi 5 mg twice daily (previously 10 mg twice daily) on 01/07/2018.  In the interim, patient continues to do well overall.  She denies any acute complaints.  Patient continues to screen his chronic fatigue.  She makes an effort to remain as active as possible.  Her daughter notes that she has taken her shopping on several occasions.  Continues to experience increased bruising with minimal trauma.  Bruising mainly noted to her bilateral upper extremities.  Patient denies bleeding; no hematochezia, melena, or gross hematuria. Patient denies that she has experienced any B symptoms. She denies any interval infections.   Patient advises that she maintains an adequate appetite. She is eating well. Weight today is 109 lb 5.6 oz (49.6 kg), which compared to her last visit to the clinic, represents a 2 pound decrease.     Patient denies pain in the clinic today.   Past Medical History:  Diagnosis Date  . Polycythemia     Past Surgical History:  Procedure Laterality Date  . ABDOMINAL HYSTERECTOMY     complete  . BREAST SURGERY      Family History  Problem Relation Age of Onset  . Heart disease Father   . Cancer Sister        breast  . Cancer Sister        breast  .  Alzheimer's disease Sister     Social History:  reports that she quit smoking about 39 years ago. She has a 30.00 pack-year smoking history. She has never used smokeless tobacco. She reports that she does not drink alcohol or use drugs.  Former smoker (1/2 pack/day) from age 42s to 79s. She quit smoking in 1980.  She occasionally drinks a glass of wine.  She is widowed.  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  She lives independently in her own apartment in [place]. Her daughter lives next door.  Patient denies known exposures to radiation on toxins. The patient is accompanied by her daughter today.  Allergies: No Known Allergies  Current Medications: Current Outpatient Medications  Medication Sig Dispense Refill  . ruxolitinib phosphate (JAKAFI) 5 MG tablet Take 1 tablet (5 mg total) by mouth 2 (two) times daily. 60 tablet 0   No current facility-administered medications for this visit.     Review of Systems  Constitutional: Positive for malaise/fatigue and weight loss (down 2 pounds). Negative for diaphoresis and fever.  HENT: Negative.   Eyes: Negative.   Respiratory: Negative for cough, hemoptysis, sputum production and shortness of breath.   Cardiovascular: Negative for chest pain, palpitations, orthopnea, leg swelling and PND.  Gastrointestinal: Negative for abdominal pain, blood in stool, constipation, diarrhea, melena, nausea and vomiting.  Genitourinary: Negative for dysuria, frequency, hematuria and  urgency.  Musculoskeletal: Negative for back pain, falls, joint pain and myalgias.  Skin: Negative for itching and rash.  Neurological: Negative for dizziness, tremors, weakness and headaches.  Endo/Heme/Allergies: Bruises/bleeds easily.  Psychiatric/Behavioral: Negative for depression, memory loss and suicidal ideas. The patient is not nervous/anxious and does not have insomnia.   All other systems reviewed and are negative.  Performance status (ECOG): 1 -  2  Vital Signs BP 105/62 (BP Location: Left Arm, Patient Position: Sitting)   Pulse 88   Temp (!) 96.4 F (35.8 C) (Tympanic)   Resp 18   Wt 109 lb 5.6 oz (49.6 kg)   SpO2 100%   BMI 17.13 kg/m   Physical Exam  Constitutional: She is oriented to person, place, and time. She does not have a sickly appearance. No distress.  Thin and frail appearing  HENT:  Head: Normocephalic and atraumatic.  Mouth/Throat: Oropharynx is clear and moist and mucous membranes are normal.  Blonde hair  Eyes: Pupils are equal, round, and reactive to light. EOM are normal. No scleral icterus.  Blue eyes  Neck: Normal range of motion. Neck supple. No tracheal deviation present. No thyromegaly present.  Cardiovascular: Normal rate, regular rhythm, normal heart sounds and intact distal pulses. Exam reveals no gallop and no friction rub.  No murmur heard. Pulmonary/Chest: Effort normal and breath sounds normal. No respiratory distress. She has no wheezes. She has no rales.  Abdominal: Soft. Bowel sounds are normal. She exhibits no distension. There is no tenderness.  Musculoskeletal: Normal range of motion. She exhibits no edema or tenderness.  Neurological: She is alert and oriented to person, place, and time.  Skin: Skin is warm and dry. Bruising (scattered) noted. No rash noted. No erythema.  Psychiatric: Mood, affect and judgment normal.  Nursing note and vitals reviewed.   Appointment on 01/21/2018  Component Date Value Ref Range Status  . Sodium 01/21/2018 135  135 - 145 mmol/L Final  . Potassium 01/21/2018 4.8  3.5 - 5.1 mmol/L Final  . Chloride 01/21/2018 100  98 - 111 mmol/L Final  . CO2 01/21/2018 25  22 - 32 mmol/L Final  . Glucose, Bld 01/21/2018 137* 70 - 99 mg/dL Final  . BUN 01/21/2018 27* 8 - 23 mg/dL Final  . Creatinine, Ser 01/21/2018 1.16* 0.44 - 1.00 mg/dL Final  . Calcium 01/21/2018 9.0  8.9 - 10.3 mg/dL Final  . Total Protein 01/21/2018 7.2  6.5 - 8.1 g/dL Final  . Albumin  01/21/2018 4.3  3.5 - 5.0 g/dL Final  . AST 01/21/2018 22  15 - 41 U/L Final  . ALT 01/21/2018 12  0 - 44 U/L Final  . Alkaline Phosphatase 01/21/2018 79  38 - 126 U/L Final  . Total Bilirubin 01/21/2018 0.1* 0.3 - 1.2 mg/dL Final  . GFR calc non Af Amer 01/21/2018 40* >60 mL/min Final  . GFR calc Af Amer 01/21/2018 47* >60 mL/min Final   Comment: (NOTE) The eGFR has been calculated using the CKD EPI equation. This calculation has not been validated in all clinical situations. eGFR's persistently <60 mL/min signify possible Chronic Kidney Disease.   Georgiann Hahn gap 01/21/2018 10  5 - 15 Final   Performed at [facility], [address], [place], [place] [address]  . WBC 01/21/2018 12.8* 4.0 - 10.5 K/uL Final  . RBC 01/21/2018 3.27* 3.87 - 5.11 MIL/uL Final  . Hemoglobin 01/21/2018 10.1* 12.0 - 15.0 g/dL Final  . HCT 01/21/2018 32.4* 36.0 - 46.0 %  Final  . MCV 01/21/2018 99.1  80.0 - 100.0 fL Final  . MCH 01/21/2018 30.9  26.0 - 34.0 pg Final  . MCHC 01/21/2018 31.2  30.0 - 36.0 g/dL Final  . RDW 01/21/2018 22.0* 11.5 - 15.5 % Final  . Platelets 01/21/2018 770* 150 - 400 K/uL Final  . nRBC 01/21/2018 0.0  0.0 - 0.2 % Final  . Neutrophils Relative % 01/21/2018 67  % Final  . Neutro Abs 01/21/2018 8.6* 1.7 - 7.7 K/uL Final  . Lymphocytes Relative 01/21/2018 15  % Final  . Lymphs Abs 01/21/2018 2.0  0.7 - 4.0 K/uL Final  . Monocytes Relative 01/21/2018 7  % Final  . Monocytes Absolute 01/21/2018 0.9  0.1 - 1.0 K/uL Final  . Eosinophils Relative 01/21/2018 6  % Final  . Eosinophils Absolute 01/21/2018 0.7* 0.0 - 0.5 K/uL Final  . Basophils Relative 01/21/2018 2  % Final  . Basophils Absolute 01/21/2018 0.2* 0.0 - 0.1 K/uL Final  . Immature Granulocytes 01/21/2018 3  % Final  . Abs Immature Granulocytes 01/21/2018 0.38* 0.00 - 0.07 K/uL Final   Performed at [facility], [address], [place], [place] [address]    Assessment:  Laura Wilcox is a 84 y.o.  female with polycythemia rubra vera and secondary myelofibrosis.  She presented in 04/2002 with thrombocytosis (835,000).  JAK2 V617F was positive on 05/20/2012.  Over the past year, hematocrit has ranged 37.5 - 42.4, hemoglobin 12.1 - 13.4, platelets 458,000 - 569,000, WBC 10,500 - 15,700.  Creatinine has been 1.0 - 1.2.  Bone marrow  on 04/15/2002 revealed a normocellular marrow for age with trilineage hematopoiesis and mild megakaryocytic hyperplasia with focal atypia.  There was no morphologic evidence of a myeloproliferative disorder.  Iron stains were inadequate for evaluation of storage iron.  There was no increase in reticulin fibers.  She has been on agrylin since 2012.  Bone marrow on 09/25/2012 revealed a persistent myeloproliferative disorder s/p Agrylin.  The current features were best classified as persistent myeloproliferative neoplasm (MPN) JAK2 V617F mutation positive.  Marrow was hypercellular for age (30-50%) with increased trilineage hematopoiesis and markedly increased atypical megakaryopoiesis with frequent clustering.  There were no increased blasts.  Iron stores were absent.  There was moderate to severe myelofibrosis (grade MF 2-3).  Differential included ET, PV, primary myelofibrosis, and MPN, unclassifiable.  Cytogenetics were normal (13, XX).  She developed pancytopenia in 12/2015.  Etiology is unclear (medication confusion or hydroxyurea + Agrylin).  She received Procrit 40,000 units (intermittently 09/2012 - 06/2016), Venofer (09/2012 - 10/2012), and Infed (12/2015 - 03/2016).  She received Neupogen during a period of neutropenia. She was anagrelide from 2004 - 08/13/2017.  She began [place] on 08/14/2017.  She began a phlebotomy program on 08/06/2017.  She undergoes small volume (150 cc with replacement of 150 cc NS) if her HCT > 42.  Symptomatically, she continues to complain of fatigue.  She denies any acute complaints.  Bruises easily with minor trauma.  She denies any  bleeding.  Exam is grossly unchanged from previous.  WBC 12,800 ([place]).  Hemoglobin 10.1, hematocrit 32.4, MCV 99.1, and platelets 770,000.  BUN 27 and creatinine 1.16 (CrCl 25.7 mL/min).   Plan: 1. Labs today: CBC with differential, CMP 2. Polycythemia rubra vera  Labs reviewed. Hemoglobin 10.1, hematocrit 32.4, and MCV 99.1  Platelet count has improved to 770,000 (previously 811,000) with reinitiation of Jakafi 5 mg twice daily.  Patient denies any missed doses.  Continue  as previously prescribed.  We will continue routine lab monitoring in 2 weeks. 3. Renal insufficiency  BUN 27 and creatinine 1.16 (CrCl 25.7 mL/min).  Patient making efforts to increase free water intake.  She is followed by nephrology Holley Raring, MD).   Continue increased fluid intake and routine lab monitoring.  4. Fatigue  Multifactorial issues related to age and PV.  Encouraged frequent rest periods and at least 8 hours of sleep at night.  5. RTC in 2 weeks for labs (CBC with diff, CMP).  6. RTC in 1 month for MD assessment and labs (CBC with differential, CMP).   Honor Loh, NP  01/21/2018, 2:05 PM

## 2018-01-30 ENCOUNTER — Other Ambulatory Visit: Payer: Self-pay | Admitting: Hematology and Oncology

## 2018-01-30 DIAGNOSIS — D45 Polycythemia vera: Secondary | ICD-10-CM

## 2018-02-04 ENCOUNTER — Other Ambulatory Visit: Payer: Medicare Other

## 2018-02-05 MED FILL — JAKAFI 5 MG TABLET: 5 | 30 days supply | Qty: 60 | Fill #0

## 2018-02-11 ENCOUNTER — Inpatient Hospital Stay: Payer: Medicare Other | Attending: Hematology and Oncology

## 2018-02-11 DIAGNOSIS — Z803 Family history of malignant neoplasm of breast: Secondary | ICD-10-CM | POA: Diagnosis not present

## 2018-02-11 DIAGNOSIS — Z79899 Other long term (current) drug therapy: Secondary | ICD-10-CM | POA: Insufficient documentation

## 2018-02-11 DIAGNOSIS — E213 Hyperparathyroidism, unspecified: Secondary | ICD-10-CM | POA: Insufficient documentation

## 2018-02-11 DIAGNOSIS — N289 Disorder of kidney and ureter, unspecified: Secondary | ICD-10-CM | POA: Insufficient documentation

## 2018-02-11 DIAGNOSIS — Z8249 Family history of ischemic heart disease and other diseases of the circulatory system: Secondary | ICD-10-CM | POA: Diagnosis not present

## 2018-02-11 DIAGNOSIS — D45 Polycythemia vera: Secondary | ICD-10-CM | POA: Insufficient documentation

## 2018-02-11 DIAGNOSIS — D7581 Myelofibrosis: Secondary | ICD-10-CM | POA: Insufficient documentation

## 2018-02-11 DIAGNOSIS — Z87891 Personal history of nicotine dependence: Secondary | ICD-10-CM | POA: Insufficient documentation

## 2018-02-11 DIAGNOSIS — D72829 Elevated white blood cell count, unspecified: Secondary | ICD-10-CM | POA: Diagnosis not present

## 2018-02-11 LAB — CBC WITH DIFFERENTIAL/PLATELET
Band Neutrophils: 3 %
Basophils Absolute: 0.3 10*3/uL — ABNORMAL HIGH (ref 0.0–0.1)
Basophils Relative: 2 %
Eosinophils Absolute: 1.6 10*3/uL — ABNORMAL HIGH (ref 0.0–0.5)
Eosinophils Relative: 11 %
HCT: 36.9 % (ref 36.0–46.0)
Hemoglobin: 11.3 g/dL — ABNORMAL LOW (ref 12.0–15.0)
Lymphocytes Relative: 19 %
Lymphs Abs: 2.7 10*3/uL (ref 0.7–4.0)
MCH: 29.9 pg (ref 26.0–34.0)
MCHC: 30.6 g/dL (ref 30.0–36.0)
MCV: 97.6 fL (ref 80.0–100.0)
Metamyelocytes Relative: 1 %
Monocytes Absolute: 1.3 10*3/uL — ABNORMAL HIGH (ref 0.1–1.0)
Monocytes Relative: 9 %
Neutro Abs: 8.4 10*3/uL — ABNORMAL HIGH (ref 1.7–7.7)
Neutrophils Relative %: 55 %
Platelets: 740 10*3/uL — ABNORMAL HIGH (ref 150–400)
RBC: 3.78 MIL/uL — ABNORMAL LOW (ref 3.87–5.11)
RDW: 21 % — ABNORMAL HIGH (ref 11.5–15.5)
WBC: 14.3 10*3/uL — ABNORMAL HIGH (ref 4.0–10.5)
nRBC: 0 % (ref 0.0–0.2)

## 2018-02-11 LAB — COMPREHENSIVE METABOLIC PANEL
ALBUMIN: 4.2 g/dL (ref 3.5–5.0)
ALT: 15 U/L (ref 0–44)
AST: 24 U/L (ref 15–41)
Alkaline Phosphatase: 93 U/L (ref 38–126)
Anion gap: 9 (ref 5–15)
BUN: 23 mg/dL (ref 8–23)
CHLORIDE: 99 mmol/L (ref 98–111)
CO2: 26 mmol/L (ref 22–32)
CREATININE: 1.02 mg/dL — AB (ref 0.44–1.00)
Calcium: 9 mg/dL (ref 8.9–10.3)
GFR calc Af Amer: 55 mL/min — ABNORMAL LOW (ref >60)
GFR, EST NON AFRICAN AMERICAN: 47 mL/min — AB (ref >60)
GLUCOSE: 94 mg/dL (ref 70–99)
Potassium: 4.9 mmol/L (ref 3.5–5.1)
Sodium: 134 mmol/L — ABNORMAL LOW (ref 135–145)
Total Bilirubin: <0.1 mg/dL — ABNORMAL LOW (ref 0.3–1.2)
Total Protein: 7.4 g/dL (ref 6.5–8.1)

## 2018-02-18 ENCOUNTER — Encounter: Payer: Self-pay | Admitting: Hematology and Oncology

## 2018-02-18 ENCOUNTER — Inpatient Hospital Stay: Payer: Medicare Other

## 2018-02-18 ENCOUNTER — Inpatient Hospital Stay (HOSPITAL_BASED_OUTPATIENT_CLINIC_OR_DEPARTMENT_OTHER): Payer: Medicare Other | Admitting: Urgent Care

## 2018-02-18 VITALS — BP 145/77 | HR 72 | Temp 96.4°F | Resp 18 | Wt 110.5 lb

## 2018-02-18 DIAGNOSIS — Z87891 Personal history of nicotine dependence: Secondary | ICD-10-CM | POA: Diagnosis not present

## 2018-02-18 DIAGNOSIS — D45 Polycythemia vera: Secondary | ICD-10-CM | POA: Diagnosis not present

## 2018-02-18 DIAGNOSIS — R7989 Other specified abnormal findings of blood chemistry: Secondary | ICD-10-CM

## 2018-02-18 DIAGNOSIS — E213 Hyperparathyroidism, unspecified: Secondary | ICD-10-CM | POA: Diagnosis not present

## 2018-02-18 DIAGNOSIS — N289 Disorder of kidney and ureter, unspecified: Secondary | ICD-10-CM | POA: Insufficient documentation

## 2018-02-18 DIAGNOSIS — D7581 Myelofibrosis: Secondary | ICD-10-CM

## 2018-02-18 DIAGNOSIS — D72829 Elevated white blood cell count, unspecified: Secondary | ICD-10-CM | POA: Diagnosis not present

## 2018-02-18 DIAGNOSIS — Z803 Family history of malignant neoplasm of breast: Secondary | ICD-10-CM

## 2018-02-18 DIAGNOSIS — Z8249 Family history of ischemic heart disease and other diseases of the circulatory system: Secondary | ICD-10-CM

## 2018-02-18 DIAGNOSIS — D649 Anemia, unspecified: Secondary | ICD-10-CM | POA: Insufficient documentation

## 2018-02-18 DIAGNOSIS — Z79899 Other long term (current) drug therapy: Secondary | ICD-10-CM | POA: Diagnosis not present

## 2018-02-18 LAB — COMPREHENSIVE METABOLIC PANEL
ALK PHOS: 88 U/L (ref 38–126)
ALT: 12 U/L (ref 0–44)
ANION GAP: 9 (ref 5–15)
AST: 19 U/L (ref 15–41)
Albumin: 4.3 g/dL (ref 3.5–5.0)
BILIRUBIN TOTAL: 0.5 mg/dL (ref 0.3–1.2)
BUN: 34 mg/dL — ABNORMAL HIGH (ref 8–23)
CALCIUM: 9.4 mg/dL (ref 8.9–10.3)
CO2: 26 mmol/L (ref 22–32)
Chloride: 101 mmol/L (ref 98–111)
Creatinine, Ser: 1.07 mg/dL — ABNORMAL HIGH (ref 0.44–1.00)
GFR calc Af Amer: 52 mL/min — ABNORMAL LOW (ref >60)
GFR calc non Af Amer: 45 mL/min — ABNORMAL LOW (ref >60)
Glucose, Bld: 85 mg/dL (ref 70–99)
Potassium: 4.6 mmol/L (ref 3.5–5.1)
Sodium: 136 mmol/L (ref 135–145)
TOTAL PROTEIN: 7.4 g/dL (ref 6.5–8.1)

## 2018-02-18 LAB — CBC WITH DIFFERENTIAL/PLATELET
ABS IMMATURE GRANULOCYTES: 0.61 10*3/uL — AB (ref 0.00–0.07)
BASOS ABS: 0.3 10*3/uL — AB (ref 0.0–0.1)
Basophils Relative: 2 %
Eosinophils Absolute: 0.9 10*3/uL — ABNORMAL HIGH (ref 0.0–0.5)
Eosinophils Relative: 6 %
HEMATOCRIT: 38.3 % (ref 36.0–46.0)
Hemoglobin: 11.9 g/dL — ABNORMAL LOW (ref 12.0–15.0)
Immature Granulocytes: 4 %
LYMPHS ABS: 2.6 10*3/uL (ref 0.7–4.0)
LYMPHS PCT: 17 %
MCH: 29.7 pg (ref 26.0–34.0)
MCHC: 31.1 g/dL (ref 30.0–36.0)
MCV: 95.5 fL (ref 80.0–100.0)
MONOS PCT: 8 %
Monocytes Absolute: 1.2 10*3/uL — ABNORMAL HIGH (ref 0.1–1.0)
NEUTROS ABS: 9.3 10*3/uL — AB (ref 1.7–7.7)
NEUTROS PCT: 63 %
NRBC: 0 % (ref 0.0–0.2)
Platelets: 719 10*3/uL — ABNORMAL HIGH (ref 150–400)
RBC: 4.01 MIL/uL (ref 3.87–5.11)
RDW: 21 % — ABNORMAL HIGH (ref 11.5–15.5)
WBC: 14.9 10*3/uL — ABNORMAL HIGH (ref 4.0–10.5)

## 2018-02-18 LAB — IRON AND TIBC
Iron: 106 ug/dL (ref 28–170)
Saturation Ratios: 37 % — ABNORMAL HIGH (ref 10.4–31.8)
TIBC: 284 ug/dL (ref 250–450)
UIBC: 178 ug/dL

## 2018-02-18 LAB — FERRITIN: Ferritin: 423 ng/mL — ABNORMAL HIGH (ref 11–307)

## 2018-02-18 NOTE — Progress Notes (Signed)
Patient offers no complaints today. 

## 2018-02-18 NOTE — Progress Notes (Signed)
[facility] day:  02/18/2018   Chief Complaint: Laura Wilcox is a 84 y.o. female with polycythemia rubra vera (PV) on Jakafi who is seen for a 1  month assessment on Jakafi.    HPI:  The patient was last seen in the hematology clinic on 01/21/2018, at which time the patient was doing well. She complained of chronic fatigue and bruising easily. No bleeding. Eating well; weight down 2 pounds. WBC 12,800 ([place]). Platelets 770,000 on Jakafi 5 mg BID.  BUN 27 and creatinine 1.16 (CrCl 25.7 mL/min).  In the interim, patient continues to do well overall.  She denies any acute physical complaints today.  Energy level has improved somewhat.  Patient has plans to go shopping today with her daughter. Patient denies that she has experienced any B symptoms. She denies any interval infections. Patient denies bleeding; no hematochezia, melena, or gross hematuria.  Patient continues to note that she bruises easily with minor trauma.  Patient is jointly managed by nephrology Holley Raring, MD), whom she sees on a regular basis.  Patient's daughter makes note of the fact the patient was diagnosed with an overactive parathyroid gland x1 week ago.  She has been on calcitriol 0.25 mg on Mondays, Wednesdays, and Fridays.  Patient feels generally well today. Patient advises that she maintains an adequate appetite. She is eating well. Weight today is 110 lb 7.2 oz (50.1 kg), which compared to her last visit to the clinic, represents a 1 pound increase.  Patient denies pain in the clinic today.  Past Medical History:  Diagnosis Date  . Polycythemia     Past Surgical History:  Procedure Laterality Date  . ABDOMINAL HYSTERECTOMY     complete  . BREAST SURGERY      Family History  Problem Relation Age of Onset  . Heart disease Father   . Cancer Sister        breast  . Cancer Sister        breast  . Alzheimer's disease Sister     Social History:  reports that  she quit smoking about 39 years ago. She has a 30.00 pack-year smoking history. She has never used smokeless tobacco. She reports that she does not drink alcohol or use drugs.  Former smoker (1/2 pack/day) from age 80s to 47s. She quit smoking in 1980.  She occasionally drinks a glass of wine.  She is widowed.  Patient is retired Sport and exercise psychologist. Patient moved to [facility] in 03/2017.  She lives independently in her own apartment in [place]. Her daughter lives next door.  Patient denies known exposures to radiation on toxins. The patient is accompanied by her daughter today.  Allergies: No Known Allergies  Current Medications: Current Outpatient Medications  Medication Sig Dispense Refill  . JAKAFI 5 MG tablet TAKE 1 TABLET (5 MG TOTAL) BY MOUTH 2 (TWO) TIMES DAILY. 60 tablet 0  . calcitRIOL (ROCALTROL) 0.25 MCG capsule Take 0.25 mcg by mouth 3 (three) times a week.     No current facility-administered medications for this visit.     Review of Systems  Constitutional: Negative for diaphoresis, fever, malaise/fatigue (energy has somewhat improved) and weight loss (up 1 pound).  HENT: Negative.   Eyes: Negative.   Respiratory: Negative for cough, hemoptysis, sputum production and shortness of breath.   Cardiovascular: Negative for chest pain, palpitations, orthopnea, leg swelling and PND.  Gastrointestinal: Negative for abdominal pain, blood in stool, constipation, diarrhea, melena, nausea and vomiting.  Genitourinary: Negative for dysuria, frequency, hematuria and urgency.  Musculoskeletal: Negative for back pain, falls, joint pain and myalgias.  Skin: Negative for itching and rash.  Neurological: Negative for dizziness, tremors, weakness and headaches.  Endo/Heme/Allergies: Bruises/bleeds easily.  Psychiatric/Behavioral: Negative for depression, memory loss and suicidal ideas. The patient is not nervous/anxious and does not have insomnia.   All other systems reviewed and are negative.  Performance status  (ECOG): 1 - 2  Vital Signs BP (!) 145/77 (BP Location: Right Arm, Patient Position: Sitting)   Pulse 72   Temp (!) 96.4 F (35.8 C) (Tympanic)   Resp 18   Wt 110 lb 7.2 oz (50.1 kg)   SpO2 99%   BMI 17.30 kg/m   Physical Exam  Constitutional: She is oriented to person, place, and time. She does not have a sickly appearance. No distress.  Thin and frail appearing  HENT:  Head: Normocephalic and atraumatic.  Mouth/Throat: Oropharynx is clear and moist and mucous membranes are normal.  Blonde hair  Eyes: Pupils are equal, round, and reactive to light. EOM are normal. No scleral icterus.  Blue eyes  Neck: Normal range of motion. Neck supple. No tracheal deviation present. No thyromegaly present.  Cardiovascular: Normal rate, regular rhythm, normal heart sounds and intact distal pulses. Exam reveals no gallop and no friction rub.  No murmur heard. Pulmonary/Chest: Effort normal and breath sounds normal. No respiratory distress. She has no wheezes. She has no rales.  Abdominal: Soft. Bowel sounds are normal. She exhibits no distension. There is no tenderness.  Musculoskeletal: Normal range of motion. She exhibits no edema or tenderness.  Neurological: She is alert and oriented to person, place, and time.  Skin: Skin is warm and dry. Bruising (scattered) noted. No rash noted. No erythema.  Psychiatric: Mood, affect and judgment normal.  Nursing note and vitals reviewed.   Appointment on 02/18/2018  Component Date Value Ref Range Status  . Iron 02/18/2018 106  28 - 170 ug/dL Final  . TIBC 02/18/2018 284  250 - 450 ug/dL Final  . Saturation Ratios 02/18/2018 37* 10.4 - 31.8 % Final  . UIBC 02/18/2018 178  ug/dL Final   Performed at [facility], [address] [place], [place] [address]  . Ferritin 02/18/2018 423* 11 - 307 ng/mL Final   Performed at [facility], [address] [place], [place] [address]  . Sodium 02/18/2018 136  135 - 145 mmol/L Final  .  Potassium 02/18/2018 4.6  3.5 - 5.1 mmol/L Final  . Chloride 02/18/2018 101  98 - 111 mmol/L Final  . CO2 02/18/2018 26  22 - 32 mmol/L Final  . Glucose, Bld 02/18/2018 85  70 - 99 mg/dL Final  . BUN 02/18/2018 34* 8 - 23 mg/dL Final  . Creatinine, Ser 02/18/2018 1.07* 0.44 - 1.00 mg/dL Final  . Calcium 02/18/2018 9.4  8.9 - 10.3 mg/dL Final  . Total Protein 02/18/2018 7.4  6.5 - 8.1 g/dL Final  . Albumin 02/18/2018 4.3  3.5 - 5.0 g/dL Final  . AST 02/18/2018 19  15 - 41 U/L Final  . ALT 02/18/2018 12  0 - 44 U/L Final  . Alkaline Phosphatase 02/18/2018 88  38 - 126 U/L Final  . Total Bilirubin 02/18/2018 0.5  0.3 - 1.2 mg/dL Final  . GFR calc non Af Amer 02/18/2018 45* >60 mL/min Final  . GFR calc Af Amer 02/18/2018 52* >60 mL/min Final   Comment: (NOTE) The eGFR has been calculated using  the CKD EPI equation. This calculation has not been validated in all clinical situations. eGFR's persistently <60 mL/min signify possible Chronic Kidney Disease.   Georgiann Hahn gap 02/18/2018 9  5 - 15 Final   Performed at [facility], [address], [place], [place] [address]  . WBC 02/18/2018 14.9* 4.0 - 10.5 K/uL Final  . RBC 02/18/2018 4.01  3.87 - 5.11 MIL/uL Final  . Hemoglobin 02/18/2018 11.9* 12.0 - 15.0 g/dL Final  . HCT 02/18/2018 38.3  36.0 - 46.0 % Final  . MCV 02/18/2018 95.5  80.0 - 100.0 fL Final  . MCH 02/18/2018 29.7  26.0 - 34.0 pg Final  . MCHC 02/18/2018 31.1  30.0 - 36.0 g/dL Final  . RDW 02/18/2018 21.0* 11.5 - 15.5 % Final  . Platelets 02/18/2018 719* 150 - 400 K/uL Final  . nRBC 02/18/2018 0.0  0.0 - 0.2 % Final  . Neutrophils Relative % 02/18/2018 63  % Final  . Neutro Abs 02/18/2018 9.3* 1.7 - 7.7 K/uL Final  . Lymphocytes Relative 02/18/2018 17  % Final  . Lymphs Abs 02/18/2018 2.6  0.7 - 4.0 K/uL Final  . Monocytes Relative 02/18/2018 8  % Final  . Monocytes Absolute 02/18/2018 1.2* 0.1 - 1.0 K/uL Final  . Eosinophils Relative 02/18/2018 6  % Final  .  Eosinophils Absolute 02/18/2018 0.9* 0.0 - 0.5 K/uL Final  . Basophils Relative 02/18/2018 2  % Final  . Basophils Absolute 02/18/2018 0.3* 0.0 - 0.1 K/uL Final  . Immature Granulocytes 02/18/2018 4  % Final  . Abs Immature Granulocytes 02/18/2018 0.61* 0.00 - 0.07 K/uL Final   Performed at [facility], [address] [place],  [address]    Assessment:  Laura Wilcox is a 84 y.o. female with polycythemia rubra vera and secondary myelofibrosis.  She presented in 04/2002 with thrombocytosis (835,000).  JAK2 V617F was positive on 05/20/2012.  Over the past year, hematocrit has ranged 37.5 - 42.4, hemoglobin 12.1 - 13.4, platelets 458,000 - 569,000, WBC 10,500 - 15,700.  Creatinine has been 1.0 - 1.2.  Bone marrow  on 04/15/2002 revealed a normocellular marrow for age with trilineage hematopoiesis and mild megakaryocytic hyperplasia with focal atypia.  There was no morphologic evidence of a myeloproliferative disorder.  Iron stains were inadequate for evaluation of storage iron.  There was no increase in reticulin fibers.  She has been on agrylin since 2012.  Bone marrow on 09/25/2012 revealed a persistent myeloproliferative disorder s/p Agrylin.  The current features were best classified as persistent myeloproliferative neoplasm (MPN) JAK2 V617F mutation positive.  Marrow was hypercellular for age (30-50%) with increased trilineage hematopoiesis and markedly increased atypical megakaryopoiesis with frequent clustering.  There were no increased blasts.  Iron stores were absent.  There was moderate to severe myelofibrosis (grade MF 2-3).  Differential included ET, PV, primary myelofibrosis, and MPN, unclassifiable.  Cytogenetics were normal (85, XX).  She developed pancytopenia in 12/2015.  Etiology is unclear (medication confusion or hydroxyurea + Agrylin).  She received Procrit 40,000 units (intermittently 09/2012 - 06/2016), Venofer (09/2012 - 10/2012), and Infed (12/2015 -  03/2016).  She received Neupogen during a period of neutropenia. She was anagrelide from 2004 - 08/13/2017.  She began [place] on 08/14/2017.  She began a phlebotomy program on 08/06/2017.  She undergoes small volume (150 cc with replacement of 150 cc NS) if her HCT > 42.  Symptomatically, patient is doing well overall.  She denies any acute physical complaints.  Patient's energy has improved overall.  Continues to bruise with minor trauma.  She denies any bleeding.  Patient notes that she feels generally well today.  Exam is grossly unchanged from previous.  WBC 14,900 ([place] 9300).  Hemoglobin 11.9, hematocrit 38.3, and platelets 719,000.  BUN 34 and creatinine 1.07 mg/dL (CrCl 28.2 mL/min).  Ferritin remains elevated at 423 ng/mL (previously 1020 ng/mL).  Iron saturation 37% with a TIBC of 284 ug/dL.    Plan: 1. Labs today: CBC with differential, CMP, ferritin, iron studies 2. Polycythemia rubra vera  Labs reviewed.  Hemoglobin 11.9, hematocrit 38.3, MCV 95.5.  Hematocrit goal is < 42.  No small volume therapeutic phlebotomy required today.   Platelet count continues to improve to 719,000 today (previously 740,000).  Continues on Jakafi 5 mg twice daily.  Continue routine lab monitoring every 2 weeks at this point. 3. Leukocytosis  WBC has ranged between 12,200 - 16,300 since 12/2017.  Denies recent illness/infection. Feels generally well in clinic.   Etiology likely multifactorial and related to :  Underlying polycythemia rubra vera (PV)  Jakafi therapy  Stress mediated response  Variable renal function 4. Renal insufficiency  Labs reviewed.  BUN 34 and creatinine 1.07 mg/dL (CrCl 28.2 mL/min).  Encourage patient to continue efforts to increase free water intake.  Jointly managed by nephrology Holley Raring, MD).  Continue routine lab monitoring. 5. Hyperparathyroidism  Newly diagnosed x 1 week ago.   On calcitriol 0.25 mg on Monday, Wednesday, and Friday.   Serum  calcium normal at 9.4 mg/dL.  No other supporting labs (calcifediol, PTH, urinary calcium) available for review during visit.   Condition being primarily managed by nephrology. 6. RTC in 2 weeks for labs (CBC with diff, CMP).  7. RTC in 1 month for MD assessment and labs (CBC with differential, CMP).   Honor Loh, NP  02/18/2018, 8:55 AM

## 2018-02-19 DIAGNOSIS — E213 Hyperparathyroidism, unspecified: Secondary | ICD-10-CM | POA: Insufficient documentation

## 2018-03-02 ENCOUNTER — Other Ambulatory Visit: Payer: Self-pay | Admitting: Urgent Care

## 2018-03-02 DIAGNOSIS — D45 Polycythemia vera: Secondary | ICD-10-CM

## 2018-03-03 MED FILL — JAKAFI 5 MG TABLET: 5 | 30 days supply | Qty: 60 | Fill #0

## 2018-03-04 ENCOUNTER — Other Ambulatory Visit: Payer: Medicare Other

## 2018-03-11 ENCOUNTER — Inpatient Hospital Stay: Payer: Medicare Other | Attending: Hematology and Oncology

## 2018-03-11 DIAGNOSIS — E213 Hyperparathyroidism, unspecified: Secondary | ICD-10-CM | POA: Insufficient documentation

## 2018-03-11 DIAGNOSIS — Z803 Family history of malignant neoplasm of breast: Secondary | ICD-10-CM | POA: Insufficient documentation

## 2018-03-11 DIAGNOSIS — N289 Disorder of kidney and ureter, unspecified: Secondary | ICD-10-CM | POA: Diagnosis not present

## 2018-03-11 DIAGNOSIS — D45 Polycythemia vera: Secondary | ICD-10-CM

## 2018-03-11 DIAGNOSIS — D72829 Elevated white blood cell count, unspecified: Secondary | ICD-10-CM | POA: Diagnosis not present

## 2018-03-11 DIAGNOSIS — Z79899 Other long term (current) drug therapy: Secondary | ICD-10-CM | POA: Diagnosis not present

## 2018-03-11 DIAGNOSIS — Z87891 Personal history of nicotine dependence: Secondary | ICD-10-CM | POA: Insufficient documentation

## 2018-03-11 LAB — CBC WITH DIFFERENTIAL/PLATELET
Abs Immature Granulocytes: 0.45 10*3/uL — ABNORMAL HIGH (ref 0.00–0.07)
Basophils Absolute: 0.3 10*3/uL — ABNORMAL HIGH (ref 0.0–0.1)
Basophils Relative: 2 %
Eosinophils Absolute: 1 10*3/uL — ABNORMAL HIGH (ref 0.0–0.5)
Eosinophils Relative: 6 %
HCT: 40.7 % (ref 36.0–46.0)
Hemoglobin: 12.4 g/dL (ref 12.0–15.0)
Immature Granulocytes: 3 %
Lymphocytes Relative: 12 %
Lymphs Abs: 1.9 10*3/uL (ref 0.7–4.0)
MCH: 28.6 pg (ref 26.0–34.0)
MCHC: 30.5 g/dL (ref 30.0–36.0)
MCV: 94 fL (ref 80.0–100.0)
Monocytes Absolute: 1.3 10*3/uL — ABNORMAL HIGH (ref 0.1–1.0)
Monocytes Relative: 8 %
Neutro Abs: 10.9 10*3/uL — ABNORMAL HIGH (ref 1.7–7.7)
Neutrophils Relative %: 69 %
Platelets: 768 10*3/uL — ABNORMAL HIGH (ref 150–400)
RBC: 4.33 MIL/uL (ref 3.87–5.11)
RDW: 20 % — ABNORMAL HIGH (ref 11.5–15.5)
WBC: 15.7 10*3/uL — ABNORMAL HIGH (ref 4.0–10.5)
nRBC: 0 % (ref 0.0–0.2)

## 2018-03-11 LAB — COMPREHENSIVE METABOLIC PANEL
ALT: 13 U/L (ref 0–44)
AST: 22 U/L (ref 15–41)
Albumin: 4.3 g/dL (ref 3.5–5.0)
Alkaline Phosphatase: 95 U/L (ref 38–126)
Anion gap: 10 (ref 5–15)
BUN: 22 mg/dL (ref 8–23)
CO2: 29 mmol/L (ref 22–32)
Calcium: 9.4 mg/dL (ref 8.9–10.3)
Chloride: 98 mmol/L (ref 98–111)
Creatinine, Ser: 1.02 mg/dL — ABNORMAL HIGH (ref 0.44–1.00)
GFR calc Af Amer: 56 mL/min — ABNORMAL LOW (ref >60)
GFR calc non Af Amer: 49 mL/min — ABNORMAL LOW (ref >60)
Glucose, Bld: 89 mg/dL (ref 70–99)
Potassium: 4.8 mmol/L (ref 3.5–5.1)
Sodium: 137 mmol/L (ref 135–145)
Total Bilirubin: 0.6 mg/dL (ref 0.3–1.2)
Total Protein: 7.5 g/dL (ref 6.5–8.1)

## 2018-03-12 DIAGNOSIS — R809 Proteinuria, unspecified: Secondary | ICD-10-CM | POA: Diagnosis not present

## 2018-03-12 DIAGNOSIS — N2581 Secondary hyperparathyroidism of renal origin: Secondary | ICD-10-CM | POA: Diagnosis not present

## 2018-03-12 DIAGNOSIS — D649 Anemia, unspecified: Secondary | ICD-10-CM | POA: Diagnosis not present

## 2018-03-12 DIAGNOSIS — N183 Chronic kidney disease, stage 3 (moderate): Secondary | ICD-10-CM | POA: Diagnosis not present

## 2018-03-18 ENCOUNTER — Inpatient Hospital Stay: Payer: Medicare Other

## 2018-03-18 ENCOUNTER — Encounter: Payer: Self-pay | Admitting: Hematology and Oncology

## 2018-03-18 ENCOUNTER — Inpatient Hospital Stay (HOSPITAL_BASED_OUTPATIENT_CLINIC_OR_DEPARTMENT_OTHER): Payer: Medicare Other | Admitting: Hematology and Oncology

## 2018-03-18 VITALS — BP 114/76 | HR 85 | Temp 97.7°F | Resp 16 | Wt 112.2 lb

## 2018-03-18 DIAGNOSIS — D72829 Elevated white blood cell count, unspecified: Secondary | ICD-10-CM

## 2018-03-18 DIAGNOSIS — Z7189 Other specified counseling: Secondary | ICD-10-CM

## 2018-03-18 DIAGNOSIS — E213 Hyperparathyroidism, unspecified: Secondary | ICD-10-CM

## 2018-03-18 DIAGNOSIS — Z79899 Other long term (current) drug therapy: Secondary | ICD-10-CM

## 2018-03-18 DIAGNOSIS — Z87891 Personal history of nicotine dependence: Secondary | ICD-10-CM

## 2018-03-18 DIAGNOSIS — D45 Polycythemia vera: Secondary | ICD-10-CM

## 2018-03-18 DIAGNOSIS — N289 Disorder of kidney and ureter, unspecified: Secondary | ICD-10-CM | POA: Diagnosis not present

## 2018-03-18 DIAGNOSIS — Z803 Family history of malignant neoplasm of breast: Secondary | ICD-10-CM

## 2018-03-18 LAB — COMPREHENSIVE METABOLIC PANEL
ALT: 13 U/L (ref 0–44)
AST: 20 U/L (ref 15–41)
Albumin: 4.3 g/dL (ref 3.5–5.0)
Alkaline Phosphatase: 86 U/L (ref 38–126)
Anion gap: 9 (ref 5–15)
BUN: 27 mg/dL — ABNORMAL HIGH (ref 8–23)
CO2: 27 mmol/L (ref 22–32)
Calcium: 9.2 mg/dL (ref 8.9–10.3)
Chloride: 100 mmol/L (ref 98–111)
Creatinine, Ser: 1.11 mg/dL — ABNORMAL HIGH (ref 0.44–1.00)
GFR calc Af Amer: 51 mL/min — ABNORMAL LOW (ref >60)
GFR calc non Af Amer: 44 mL/min — ABNORMAL LOW (ref >60)
Glucose, Bld: 99 mg/dL (ref 70–99)
Potassium: 4.2 mmol/L (ref 3.5–5.1)
Sodium: 136 mmol/L (ref 135–145)
Total Bilirubin: 0.5 mg/dL (ref 0.3–1.2)
Total Protein: 7.7 g/dL (ref 6.5–8.1)

## 2018-03-18 LAB — CBC WITH DIFFERENTIAL/PLATELET
Abs Immature Granulocytes: 0.41 10*3/uL — ABNORMAL HIGH (ref 0.00–0.07)
Basophils Absolute: 0.2 10*3/uL — ABNORMAL HIGH (ref 0.0–0.1)
Basophils Relative: 2 %
Eosinophils Absolute: 0.8 10*3/uL — ABNORMAL HIGH (ref 0.0–0.5)
Eosinophils Relative: 5 %
HCT: 40 % (ref 36.0–46.0)
Hemoglobin: 12.2 g/dL (ref 12.0–15.0)
Immature Granulocytes: 3 %
Lymphocytes Relative: 12 %
Lymphs Abs: 1.7 10*3/uL (ref 0.7–4.0)
MCH: 28.4 pg (ref 26.0–34.0)
MCHC: 30.5 g/dL (ref 30.0–36.0)
MCV: 93 fL (ref 80.0–100.0)
Monocytes Absolute: 1.2 10*3/uL — ABNORMAL HIGH (ref 0.1–1.0)
Monocytes Relative: 8 %
Neutro Abs: 10.2 10*3/uL — ABNORMAL HIGH (ref 1.7–7.7)
Neutrophils Relative %: 70 %
Platelets: 689 10*3/uL — ABNORMAL HIGH (ref 150–400)
RBC: 4.3 MIL/uL (ref 3.87–5.11)
RDW: 20.7 % — ABNORMAL HIGH (ref 11.5–15.5)
WBC: 14.5 10*3/uL — ABNORMAL HIGH (ref 4.0–10.5)
nRBC: 0 % (ref 0.0–0.2)

## 2018-03-18 NOTE — Progress Notes (Signed)
Pt here for f/u. Denies any complaints at this time.  

## 2018-03-18 NOTE — Progress Notes (Signed)
[facility] day:  03/18/2018   Chief Complaint: Laura Wilcox is a 84 y.o. female with polycythemia rubra vera (PV) on Jakafi who is seen for a 1  month assessment on Jakafi.   HPI:  The patient was last seen in the hematology clinic on 02/18/2018 by Honor Loh, NP.  She was doing well overall.  She denied any acute physical complaints.  Patient's energy had improved overall.  She continued to bruise with minor trauma.  She denied any bleeding.  Patient noted that she felt generally well.  Exam was grossly unchanged from previous.  WBC was 14,900 ([place] 9300).  Hemoglobin was 11.9, hematocrit 38.3, and platelets 719,000.  BUN 34 and creatinine 1.07 mg/dL (CrCl 28.2 mL/min).  Ferritin remained elevated at 423 ng/mL (previously 1020 ng/mL).  Iron saturation was 37% with a TIBC of 284 ug/dL.  CBC on 03/11/2018 revealed a hematocrit of 40.7, hemoglobin 12.4, MCV 94, platelets 768,000, WBC 15,700 with an ANC of 10,900.  It was discovered that she was missed taking her Jakafi on occasion (days daughter does not check in with her).   During the interim, patient is doing well. She denies any acute concerns. Patient denies that she has experienced any B symptoms. She denies any interval infections. Patient denies bleeding; no hematochezia, melena, or gross hematuria. Energy level remains stable. She has not experienced any areas of increased bruising.   Patient advises that she maintains an adequate appetite. She is eating well. Weight today is 112 lb 3.4 oz (50.9 kg), which compared to her last visit to the clinic, represents a 2 pound increase.   Patient denies pain in the clinic today.   Past Medical History:  Diagnosis Date  . Polycythemia     Past Surgical History:  Procedure Laterality Date  . ABDOMINAL HYSTERECTOMY     complete  . BREAST SURGERY      Family History  Problem Relation Age of Onset  . Heart disease Father   . Cancer Sister         breast  . Cancer Sister        breast  . Alzheimer's disease Sister     Social History:  reports that she quit smoking about 39 years ago. She has a 30.00 pack-year smoking history. She has never used smokeless tobacco. She reports that she does not drink alcohol or use drugs.  Former smoker (1/2 pack/day) from age 28s to 73s. She quit smoking in 1980.  She occasionally drinks a glass of wine.  She is widowed.  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  She lives independently in her own apartment in [place]. Her daughter lives next door.  Patient denies known exposures to radiation on toxins. The patient is accompanied by her daughter today.  Allergies: No Known Allergies  Current Medications: Current Outpatient Medications  Medication Sig Dispense Refill  . calcitRIOL (ROCALTROL) 0.25 MCG capsule Take 0.25 mcg by mouth 3 (three) times a week.    Marland Kitchen JAKAFI 5 MG tablet TAKE 1 TABLET (5 MG TOTAL) BY MOUTH 2 (TWO) TIMES DAILY. 60 tablet 0   No current facility-administered medications for this visit.     Review of Systems  Constitutional: Negative.  Negative for chills, diaphoresis, fever, malaise/fatigue (energy level is good) and weight loss (up 2 pounds).       Feels "really well".  HENT: Negative.  Negative for congestion, ear discharge, ear pain, nosebleeds, sinus  pain and sore throat.   Eyes: Negative.  Negative for double vision, photophobia and pain.  Respiratory: Negative.  Negative for cough, hemoptysis, sputum production and shortness of breath.   Cardiovascular: Negative.  Negative for chest pain, palpitations, orthopnea, leg swelling and PND.  Gastrointestinal: Negative.  Negative for blood in stool, constipation, diarrhea, heartburn, melena, nausea and vomiting.  Genitourinary: Negative.  Negative for dysuria, frequency, hematuria and urgency.  Musculoskeletal: Negative.  Negative for back pain, falls, joint pain, myalgias and neck pain.  Skin: Negative.   Negative for itching and rash.  Neurological: Negative.  Negative for dizziness, tingling, tremors, sensory change, speech change, focal weakness, weakness and headaches.  Endo/Heme/Allergies: Bruises/bleeds easily.  Psychiatric/Behavioral: Negative.  Negative for depression and memory loss. The patient is not nervous/anxious and does not have insomnia.   All other systems reviewed and are negative.  Performance status (ECOG): 1-2  Vital Signs BP 114/76 (BP Location: Left Arm, Patient Position: Sitting)   Pulse 85   Temp 97.7 F (36.5 C) (Oral)   Resp 16   Wt 112 lb 3.4 oz (50.9 kg)   SpO2 100%   BMI 17.58 kg/m   Physical Exam  Constitutional: She is oriented to person, place, and time. She does not have a sickly appearance. No distress.  Thin elderly woman sitting comfortably in the exam room.  HENT:  Head: Normocephalic and atraumatic.  Mouth/Throat: Oropharynx is clear and moist and mucous membranes are normal. No oropharyngeal exudate.  Short blonde hair.  Eyes: Pupils are equal, round, and reactive to light. Conjunctivae and EOM are normal. No scleral icterus.  Blue eyes.  Neck: Normal range of motion. Neck supple. No JVD present.  Cardiovascular: Normal rate, regular rhythm, normal heart sounds and intact distal pulses. Exam reveals no gallop and no friction rub.  No murmur heard. Pulmonary/Chest: Effort normal and breath sounds normal. No respiratory distress. She has no wheezes. She has no rales.  Abdominal: Soft. Bowel sounds are normal. She exhibits no distension and no mass. There is no abdominal tenderness. There is no rebound and no guarding.  Musculoskeletal: Normal range of motion.        General: No tenderness or edema.  Lymphadenopathy:    She has no cervical adenopathy.  Neurological: She is alert and oriented to person, place, and time.  Skin: Skin is warm and dry. Bruising (scattered) noted. No rash noted. She is not diaphoretic. No erythema. No pallor.   Psychiatric: Mood, affect and judgment normal.  Nursing note and vitals reviewed.   Appointment on 03/18/2018  Component Date Value Ref Range Status  . Sodium 03/18/2018 136  135 - 145 mmol/L Final  . Potassium 03/18/2018 4.2  3.5 - 5.1 mmol/L Final  . Chloride 03/18/2018 100  98 - 111 mmol/L Final  . CO2 03/18/2018 27  22 - 32 mmol/L Final  . Glucose, Bld 03/18/2018 99  70 - 99 mg/dL Final  . BUN 03/18/2018 27* 8 - 23 mg/dL Final  . Creatinine, Ser 03/18/2018 1.11* 0.44 - 1.00 mg/dL Final  . Calcium 03/18/2018 9.2  8.9 - 10.3 mg/dL Final  . Total Protein 03/18/2018 7.7  6.5 - 8.1 g/dL Final  . Albumin 03/18/2018 4.3  3.5 - 5.0 g/dL Final  . AST 03/18/2018 20  15 - 41 U/L Final  . ALT 03/18/2018 13  0 - 44 U/L Final  . Alkaline Phosphatase 03/18/2018 86  38 - 126 U/L Final  . Total Bilirubin 03/18/2018 0.5  0.3 -  1.2 mg/dL Final  . GFR calc non Af Amer 03/18/2018 44* >60 mL/min Final  . GFR calc Af Amer 03/18/2018 51* >60 mL/min Final  . Anion gap 03/18/2018 9  5 - 15 Final   Performed at [facility], [address], [place], [place] [address]  . WBC 03/18/2018 14.5* 4.0 - 10.5 K/uL Final  . RBC 03/18/2018 4.30  3.87 - 5.11 MIL/uL Final  . Hemoglobin 03/18/2018 12.2  12.0 - 15.0 g/dL Final  . HCT 03/18/2018 40.0  36.0 - 46.0 % Final  . MCV 03/18/2018 93.0  80.0 - 100.0 fL Final  . MCH 03/18/2018 28.4  26.0 - 34.0 pg Final  . MCHC 03/18/2018 30.5  30.0 - 36.0 g/dL Final  . RDW 03/18/2018 20.7* 11.5 - 15.5 % Final  . Platelets 03/18/2018 689* 150 - 400 K/uL Final  . nRBC 03/18/2018 0.0  0.0 - 0.2 % Final  . Neutrophils Relative % 03/18/2018 70  % Final  . Neutro Abs 03/18/2018 10.2* 1.7 - 7.7 K/uL Final  . Lymphocytes Relative 03/18/2018 12  % Final  . Lymphs Abs 03/18/2018 1.7  0.7 - 4.0 K/uL Final  . Monocytes Relative 03/18/2018 8  % Final  . Monocytes Absolute 03/18/2018 1.2* 0.1 - 1.0 K/uL Final  . Eosinophils Relative 03/18/2018 5  % Final  . Eosinophils  Absolute 03/18/2018 0.8* 0.0 - 0.5 K/uL Final  . Basophils Relative 03/18/2018 2  % Final  . Basophils Absolute 03/18/2018 0.2* 0.0 - 0.1 K/uL Final  . Immature Granulocytes 03/18/2018 3  % Final  . Abs Immature Granulocytes 03/18/2018 0.41* 0.00 - 0.07 K/uL Final   Performed at [facility], [address], [place], [place] [address]    Assessment:  Laura Wilcox is a 84 y.o. female with polycythemia rubra vera and secondary myelofibrosis.  She presented in 04/2002 with thrombocytosis (835,000).  JAK2 V617F was positive on 05/20/2012.  Over the past year, hematocrit has ranged 37.5 - 42.4, hemoglobin 12.1 - 13.4, platelets 458,000 - 569,000, WBC 10,500 - 15,700.  Creatinine has been 1.0 - 1.2.  Bone marrow  on 04/15/2002 revealed a normocellular marrow for age with trilineage hematopoiesis and mild megakaryocytic hyperplasia with focal atypia.  There was no morphologic evidence of a myeloproliferative disorder.  Iron stains were inadequate for evaluation of storage iron.  There was no increase in reticulin fibers.  She has been on agrylin since 2012.  Bone marrow on 09/25/2012 revealed a persistent myeloproliferative disorder s/p Agrylin.  The current features were best classified as persistent myeloproliferative neoplasm (MPN) JAK2 V617F mutation positive.  Marrow was hypercellular for age (30-50%) with increased trilineage hematopoiesis and markedly increased atypical megakaryopoiesis with frequent clustering.  There were no increased blasts.  Iron stores were absent.  There was moderate to severe myelofibrosis (grade MF 2-3).  Differential included ET, PV, primary myelofibrosis, and MPN, unclassifiable.  Cytogenetics were normal (6, XX).  She developed pancytopenia in 12/2015.  Etiology is unclear (medication confusion or hydroxyurea + Agrylin).  She received Procrit 40,000 units (intermittently 09/2012 - 06/2016), Venofer (09/2012 - 10/2012), and Infed (12/2015 - 03/2016).   She received Neupogen during a period of neutropenia. She was anagrelide from 2004 - 08/13/2017.  She began [place] on 08/14/2017.  She began a phlebotomy program on 08/06/2017.  She undergoes small volume (150 cc with replacement of 150 cc NS) if her HCT > 42.  Ferritin has been followed: 529 on 09/03/2017, 1020 on 12/03/2017, and  423 on 02/18/2018.  Symptomatically, she is feeling good.  Exam is stable.  WBC 14,500 (ANC 10,200).  Hemoglobin 12.2, hematocrit 40.0, and platelets 689,000.  BUN 27 and creatinine 1.11.   Plan: 1. Labs today: CBC with diff, CMP. 2. Polycythemia rubra vera Hemoglobin 12.2, hematocrit 40.0, MCV 93.0. Hematocrit below goal of 42.    No phlebotomy today.  Platelet count 689,000 today (previously 768,000).  Platelet count goal 400,000.  Patient has missed doses of Jakafi. Continue Jakafi 5 mg twice daily secondary to improving platelet count and missed doses of Jakafi. Anticipate platelet count will continue to improve. If platelet count does not continue to improve or levels off below goal, discuss increasing dose. 3. Leukocytosis WBC 14,500. Etiology secondary to polycythemia rubra vera. WBC has ranged between 12,200 - 16,300 since 12/2017. Continue to monitor. 4. Renal insufficiency Labs reviewed.  BUN 27 and creatinine 1.11 mg/dL. Continue to encourage fluids. Patient followed by Dr. Holley Raring, nephrology. Continue to monitor. 5. Hyperparathyroidism Calcium 9.2. Patient on calcitriol 0.25 mg on Monday, Wednesday, and Friday.  Managed by nephrology. 6. RTC in 1 month for labs (CBC with diff, fasting lipid panel). 7. RTC in 2 months for MD assessment and labs (CBC with diff, CMP).   Honor Loh, NP  03/18/2018, 12:08 PM   I saw and evaluated the patient, participating in the key portions of the service and reviewing pertinent diagnostic studies and records.  I reviewed the nurse practitioner's note and agree with the findings and the plan.  The  assessment and plan were discussed with the patient.  Several questions were asked by the patient and answered.   Nolon Stalls, MD 03/18/2018,12:08 PM

## 2018-03-19 DIAGNOSIS — R809 Proteinuria, unspecified: Secondary | ICD-10-CM | POA: Diagnosis not present

## 2018-03-19 DIAGNOSIS — N183 Chronic kidney disease, stage 3 (moderate): Secondary | ICD-10-CM | POA: Diagnosis not present

## 2018-03-19 DIAGNOSIS — D649 Anemia, unspecified: Secondary | ICD-10-CM | POA: Diagnosis not present

## 2018-03-19 DIAGNOSIS — N2581 Secondary hyperparathyroidism of renal origin: Secondary | ICD-10-CM | POA: Diagnosis not present

## 2018-04-15 ENCOUNTER — Inpatient Hospital Stay: Payer: Medicare Other | Attending: Hematology and Oncology

## 2018-04-15 ENCOUNTER — Other Ambulatory Visit: Payer: Self-pay | Admitting: Urgent Care

## 2018-04-15 ENCOUNTER — Telehealth: Payer: Self-pay

## 2018-04-15 DIAGNOSIS — D45 Polycythemia vera: Secondary | ICD-10-CM | POA: Diagnosis not present

## 2018-04-15 LAB — CBC WITH DIFFERENTIAL/PLATELET
Abs Immature Granulocytes: 0.5 10*3/uL — ABNORMAL HIGH (ref 0.00–0.07)
Basophils Absolute: 0.3 10*3/uL — ABNORMAL HIGH (ref 0.0–0.1)
Basophils Relative: 2 %
Eosinophils Absolute: 0.6 10*3/uL — ABNORMAL HIGH (ref 0.0–0.5)
Eosinophils Relative: 4 %
HCT: 40.1 % (ref 36.0–46.0)
Hemoglobin: 12.4 g/dL (ref 12.0–15.0)
Immature Granulocytes: 4 %
Lymphocytes Relative: 10 %
Lymphs Abs: 1.4 10*3/uL (ref 0.7–4.0)
MCH: 28 pg (ref 26.0–34.0)
MCHC: 30.9 g/dL (ref 30.0–36.0)
MCV: 90.5 fL (ref 80.0–100.0)
Monocytes Absolute: 1 10*3/uL (ref 0.1–1.0)
Monocytes Relative: 7 %
Neutro Abs: 10.5 10*3/uL — ABNORMAL HIGH (ref 1.7–7.7)
Neutrophils Relative %: 73 %
Platelets: 696 10*3/uL — ABNORMAL HIGH (ref 150–400)
RBC: 4.43 MIL/uL (ref 3.87–5.11)
RDW: 21.1 % — ABNORMAL HIGH (ref 11.5–15.5)
WBC: 14.2 10*3/uL — ABNORMAL HIGH (ref 4.0–10.5)
nRBC: 0 % (ref 0.0–0.2)

## 2018-04-15 LAB — LIPID PANEL
Cholesterol: 270 mg/dL — ABNORMAL HIGH (ref 0–200)
HDL: 72 mg/dL (ref >40)
LDL Cholesterol: 170 mg/dL — ABNORMAL HIGH (ref 0–99)
Total CHOL/HDL Ratio: 3.8 RATIO
Triglycerides: 140 mg/dL (ref <150)
VLDL: 28 mg/dL (ref 0–40)

## 2018-04-15 NOTE — Telephone Encounter (Signed)
Contacted daughter, [name], to advise of lab results as requested. Informed daughter Lipid panel would be sent to PCP and for her to f/u with PCP regarding cholesterol levels. Daughter also requested for appt. Change from 2/19 to 2/5. Confirmed with Gaspar Bidding and rescheduled by Robin. Daughter denies any concerns or questions at this time.

## 2018-04-21 ENCOUNTER — Telehealth: Payer: Self-pay

## 2018-04-21 NOTE — Telephone Encounter (Signed)
Spoke with the patient daugther/ per bryan he wanted to make sure the patient was consistenty with the [facility]. The patient was not taking the medication consistenty. I have instructed the patient to start taking the Jakafi daily 5 mg 1 tab po BID. The patient states the lipid panel was fasting and will get furture labs. The daugther states her mother doesn't have a PCP to follow her care. The daughter states her PCP left the office. But has agreed to keep all schedule appointment.

## 2018-04-21 NOTE — Telephone Encounter (Signed)
-----   Message from Karen Kitchens, NP sent at 04/16/2018 11:54 AM EST ----- Regarding: FW: Please call daughter  ----- Message ----- From: Lequita Asal, MD Sent: 04/15/2018   4:31 PM EST To: Karen Kitchens, NP Subject: Please call daughter                            Ensure she is taking her [place].  If she has been taking her Jakafi consistenty, then we will need to increase her dose from 5 mg BID to 10 mg BID.  Was her lipid panel fasting?  If not, we will need to repeat (as fasting) sometime in the future.  If yes, we need to forward to PCP.  M  ----- Message ----- From: Buel Ream, Lab In [place] Sent: 04/15/2018  11:02 AM EST To: Lequita Asal, MD

## 2018-04-22 MED FILL — JAKAFI 5 MG TABLET: 5 | 30 days supply | Qty: 60 | Fill #0

## 2018-05-06 ENCOUNTER — Inpatient Hospital Stay: Payer: Medicare Other | Attending: Hematology and Oncology | Admitting: Hematology and Oncology

## 2018-05-06 ENCOUNTER — Encounter: Payer: Self-pay | Admitting: Hematology and Oncology

## 2018-05-06 ENCOUNTER — Inpatient Hospital Stay: Payer: Medicare Other | Attending: Hematology and Oncology

## 2018-05-06 VITALS — BP 90/64 | HR 75 | Temp 97.6°F | Resp 16 | Wt 110.1 lb

## 2018-05-06 DIAGNOSIS — D45 Polycythemia vera: Secondary | ICD-10-CM

## 2018-05-06 DIAGNOSIS — D7581 Myelofibrosis: Secondary | ICD-10-CM

## 2018-05-06 DIAGNOSIS — Z79899 Other long term (current) drug therapy: Secondary | ICD-10-CM | POA: Diagnosis not present

## 2018-05-06 DIAGNOSIS — E213 Hyperparathyroidism, unspecified: Secondary | ICD-10-CM

## 2018-05-06 DIAGNOSIS — N289 Disorder of kidney and ureter, unspecified: Secondary | ICD-10-CM | POA: Diagnosis not present

## 2018-05-06 DIAGNOSIS — Z87891 Personal history of nicotine dependence: Secondary | ICD-10-CM | POA: Diagnosis not present

## 2018-05-06 DIAGNOSIS — Z8249 Family history of ischemic heart disease and other diseases of the circulatory system: Secondary | ICD-10-CM

## 2018-05-06 DIAGNOSIS — H9113 Presbycusis, bilateral: Secondary | ICD-10-CM | POA: Diagnosis not present

## 2018-05-06 DIAGNOSIS — Z7189 Other specified counseling: Secondary | ICD-10-CM

## 2018-05-06 LAB — COMPREHENSIVE METABOLIC PANEL
ALT: 12 U/L (ref 0–44)
AST: 18 U/L (ref 15–41)
Albumin: 4.1 g/dL (ref 3.5–5.0)
Alkaline Phosphatase: 88 U/L (ref 38–126)
Anion gap: 11 (ref 5–15)
BUN: 23 mg/dL (ref 8–23)
CO2: 24 mmol/L (ref 22–32)
Calcium: 9.2 mg/dL (ref 8.9–10.3)
Chloride: 101 mmol/L (ref 98–111)
Creatinine, Ser: 1.08 mg/dL — ABNORMAL HIGH (ref 0.44–1.00)
GFR calc Af Amer: 53 mL/min — ABNORMAL LOW (ref >60)
GFR calc non Af Amer: 45 mL/min — ABNORMAL LOW (ref >60)
Glucose, Bld: 85 mg/dL (ref 70–99)
Potassium: 4.4 mmol/L (ref 3.5–5.1)
Sodium: 136 mmol/L (ref 135–145)
Total Bilirubin: 0.6 mg/dL (ref 0.3–1.2)
Total Protein: 7.3 g/dL (ref 6.5–8.1)

## 2018-05-06 LAB — CBC WITH DIFFERENTIAL/PLATELET
Abs Immature Granulocytes: 0.67 10*3/uL — ABNORMAL HIGH (ref 0.00–0.07)
Basophils Absolute: 0.2 10*3/uL — ABNORMAL HIGH (ref 0.0–0.1)
Basophils Relative: 2 %
Eosinophils Absolute: 0.8 10*3/uL — ABNORMAL HIGH (ref 0.0–0.5)
Eosinophils Relative: 6 %
HCT: 41.5 % (ref 36.0–46.0)
Hemoglobin: 12.9 g/dL (ref 12.0–15.0)
Immature Granulocytes: 5 %
Lymphocytes Relative: 14 %
Lymphs Abs: 2 10*3/uL (ref 0.7–4.0)
MCH: 27.6 pg (ref 26.0–34.0)
MCHC: 31.1 g/dL (ref 30.0–36.0)
MCV: 88.7 fL (ref 80.0–100.0)
Monocytes Absolute: 1 10*3/uL (ref 0.1–1.0)
Monocytes Relative: 7 %
Neutro Abs: 9.6 10*3/uL — ABNORMAL HIGH (ref 1.7–7.7)
Neutrophils Relative %: 66 %
Platelets: 574 10*3/uL — ABNORMAL HIGH (ref 150–400)
RBC: 4.68 MIL/uL (ref 3.87–5.11)
RDW: 21.8 % — ABNORMAL HIGH (ref 11.5–15.5)
WBC: 14.4 10*3/uL — ABNORMAL HIGH (ref 4.0–10.5)
nRBC: 0.1 % (ref 0.0–0.2)

## 2018-05-06 NOTE — Progress Notes (Signed)
Pt here for follow up. Denies any concerns at this time.  

## 2018-05-06 NOTE — Progress Notes (Signed)
[facility] day:  05/06/2018   Chief Complaint: Laura Wilcox is a 85 y.o. female with polycythemia rubra vera (PV) on Jakafi who is seen for 2 month assessment on Jakafi.   HPI:  The patient was last seen in the hematology clinic on 03/18/2018.  At that time, she was doing well on Jakafi 5 mg BID.  CBC revealed a hematocrit of 40.0, hemoglobin 12.2, platelet count of 689,000, WBC 14,500 with an ANC of 10,2000.  Platelet count was improving.  CBC on 04/15/2018 revealed a hematocrit of 40.1, hemoglobin 12.4, MCV 90.5, platelets 696,000, WBC 14,200 with an ANC of 10,500.  Cholesterol was 270.  LDL was 170.  She was contacted on the phone on 04/21/2018.  She was not taking her Jakafi consistently. Daughter has been more vigilant about her medication dosing.  She is now using a pill box.  Daughter found one in her purse and 1 on the floor.  Patient has not missed any doses of her medication in the last 2 weeks.  During the interim, patient is doing well today. She does not make note of any acute or concerning symptoms. She feels generally well. Patient denies that she has experienced any B symptoms. She denies any interval infections. Patient denies any increased bruising and bleeding.   Patient has increasing issues with presbycusis. She has been seen by audiology in the past and hearing aides were recommended. This was prior to moving to [place]. Audiology noted that patient should wait until she moves in the event that her devices need service. Daughter requesting referral to audiology.   Patient advises that she maintains an adequate appetite. She is eating well. Weight today is 110 lb 1.9 oz (50 kg), which compared to her last visit to the clinic, represents a 2 pound decrease.   Patient denies pain in the clinic today.   Past Medical History:  Diagnosis Date  . Polycythemia     Past Surgical History:  Procedure Laterality Date  . ABDOMINAL  HYSTERECTOMY     complete  . BREAST SURGERY      Family History  Problem Relation Age of Onset  . Heart disease Father   . Cancer Sister        breast  . Cancer Sister        breast  . Alzheimer's disease Sister     Social History:  reports that she quit smoking about 40 years ago. She has a 30.00 pack-year smoking history. She has never used smokeless tobacco. She reports that she does not drink alcohol or use drugs.  Former smoker (1/2 pack/day) from age 68s to 39s. She quit smoking in 1980.  She occasionally drinks a glass of wine.  She is widowed.  Patient is retired Sport and exercise psychologist. Patient moved to [facility] in 03/2017.  She lives independently in her own apartment in [place]. Her daughter lives next door.  Patient denies known exposures to radiation on toxins. The patient is accompanied by her daughter today.  Allergies: No Known Allergies  Current Medications: Current Outpatient Medications  Medication Sig Dispense Refill  . calcitRIOL (ROCALTROL) 0.25 MCG capsule Take 0.25 mcg by mouth 3 (three) times a week.    Marland Kitchen JAKAFI 5 MG tablet TAKE 1 TABLET (5 MG TOTAL) BY MOUTH 2 (TWO) TIMES DAILY. 60 tablet 0   No current facility-administered medications for this visit.     Review of Systems  Constitutional: Positive for weight loss (2 pounds). Negative  for chills, diaphoresis, fever and malaise/fatigue.       Feels "good". Only complaint is "hearing".  HENT: Positive for hearing loss. Negative for congestion, ear discharge, ear pain, nosebleeds, sinus pain and sore throat.   Eyes: Negative.  Negative for blurred vision, double vision, photophobia and pain.  Respiratory: Negative.  Negative for cough, hemoptysis, sputum production and shortness of breath.   Cardiovascular: Negative.  Negative for chest pain, palpitations, orthopnea, leg swelling and PND.  Gastrointestinal: Negative.  Negative for abdominal pain, blood in stool, constipation, diarrhea, heartburn, Wilcox, nausea and vomiting.   Genitourinary: Negative.  Negative for dysuria, frequency, hematuria and urgency.  Musculoskeletal: Negative.  Negative for back pain, falls, joint pain, myalgias and neck pain.  Skin: Negative.  Negative for itching and rash.  Neurological: Negative.  Negative for dizziness, tingling, tremors, sensory change, speech change, focal weakness, weakness and headaches.  Endo/Heme/Allergies: Bruises/bleeds easily.  Psychiatric/Behavioral: Negative.  Negative for depression and memory loss. The patient is not nervous/anxious and does not have insomnia.   All other systems reviewed and are negative.  Performance status (ECOG): 1-2  Vital Signs BP 90/64 (BP Location: Left Arm, Patient Position: Sitting) Comment: Checked Orthos: Standing- 105/74  Pulse 75   Temp 97.6 F (36.4 C) (Oral)   Resp 16   Wt 110 lb 1.9 oz (50 kg)   SpO2 98%   BMI 17.25 kg/m   Physical Exam  Constitutional: She is oriented to person, place, and time. She does not have a sickly appearance. No distress.  Thin elderly woman sitting comfortably in the exam room.  HENT:  Head: Normocephalic and atraumatic.  Mouth/Throat: Oropharynx is clear and moist and mucous membranes are normal. No oropharyngeal exudate.  Short blonde hair.  Eyes: Pupils are equal, round, and reactive to light. Conjunctivae and EOM are normal. No scleral icterus.  Blue eyes.  Neck: Normal range of motion. Neck supple. No JVD present.  Cardiovascular: Normal rate, regular rhythm, normal heart sounds and intact distal pulses. Exam reveals no gallop and no friction rub.  No murmur heard. Pulmonary/Chest: Effort normal and breath sounds normal. No respiratory distress. She has no wheezes. She has no rales.  Abdominal: Soft. Bowel sounds are normal. She exhibits no distension and no mass. There is no abdominal tenderness. There is no rebound and no guarding.  Musculoskeletal: Normal range of motion.        General: No tenderness or edema.   Lymphadenopathy:    She has no cervical adenopathy.  Neurological: She is alert and oriented to person, place, and time.  Skin: Skin is warm and dry. Bruising (scattered) noted. No rash noted. No erythema. No pallor.  Psychiatric: Mood, affect and judgment normal.  Nursing note and vitals reviewed.   Appointment on 05/06/2018  Component Date Value Ref Range Status  . Sodium 05/06/2018 136  135 - 145 mmol/L Final  . Potassium 05/06/2018 4.4  3.5 - 5.1 mmol/L Final  . Chloride 05/06/2018 101  98 - 111 mmol/L Final  . CO2 05/06/2018 24  22 - 32 mmol/L Final  . Glucose, Bld 05/06/2018 85  70 - 99 mg/dL Final  . BUN 05/06/2018 23  8 - 23 mg/dL Final  . Creatinine, Ser 05/06/2018 1.08* 0.44 - 1.00 mg/dL Final  . Calcium 05/06/2018 9.2  8.9 - 10.3 mg/dL Final  . Total Protein 05/06/2018 7.3  6.5 - 8.1 g/dL Final  . Albumin 05/06/2018 4.1  3.5 - 5.0 g/dL Final  . AST 05/06/2018 18  15 - 41 U/L Final  . ALT 05/06/2018 12  0 - 44 U/L Final  . Alkaline Phosphatase 05/06/2018 88  38 - 126 U/L Final  . Total Bilirubin 05/06/2018 0.6  0.3 - 1.2 mg/dL Final  . GFR calc non Af Amer 05/06/2018 45* >60 mL/min Final  . GFR calc Af Amer 05/06/2018 53* >60 mL/min Final  . Anion gap 05/06/2018 11  5 - 15 Final   Performed at [facility], [address], [place], [place] [address]  . WBC 05/06/2018 14.4* 4.0 - 10.5 K/uL Final  . RBC 05/06/2018 4.68  3.87 - 5.11 MIL/uL Final  . Hemoglobin 05/06/2018 12.9  12.0 - 15.0 g/dL Final  . HCT 05/06/2018 41.5  36.0 - 46.0 % Final  . MCV 05/06/2018 88.7  80.0 - 100.0 fL Final  . MCH 05/06/2018 27.6  26.0 - 34.0 pg Final  . MCHC 05/06/2018 31.1  30.0 - 36.0 g/dL Final  . RDW 05/06/2018 21.8* 11.5 - 15.5 % Final  . Platelets 05/06/2018 574* 150 - 400 K/uL Final  . nRBC 05/06/2018 0.1  0.0 - 0.2 % Final  . Neutrophils Relative % 05/06/2018 66  % Final  . Neutro Abs 05/06/2018 9.6* 1.7 - 7.7 K/uL Final  . Lymphocytes Relative 05/06/2018 14  % Final   . Lymphs Abs 05/06/2018 2.0  0.7 - 4.0 K/uL Final  . Monocytes Relative 05/06/2018 7  % Final  . Monocytes Absolute 05/06/2018 1.0  0.1 - 1.0 K/uL Final  . Eosinophils Relative 05/06/2018 6  % Final  . Eosinophils Absolute 05/06/2018 0.8* 0.0 - 0.5 K/uL Final  . Basophils Relative 05/06/2018 2  % Final  . Basophils Absolute 05/06/2018 0.2* 0.0 - 0.1 K/uL Final  . Immature Granulocytes 05/06/2018 5  % Final  . Abs Immature Granulocytes 05/06/2018 0.67* 0.00 - 0.07 K/uL Final   Performed at [facility], [address], [place], [place] [address]    Assessment:  Laura Wilcox is a 85 y.o. female with polycythemia rubra vera and secondary myelofibrosis.  She presented in 04/2002 with thrombocytosis (835,000).  JAK2 V617F was positive on 05/20/2012.  Over the past year, hematocrit has ranged 37.5 - 42.4, hemoglobin 12.1 - 13.4, platelets 458,000 - 569,000, WBC 10,500 - 15,700.  Creatinine has been 1.0 - 1.2.  Bone marrow  on 04/15/2002 revealed a normocellular marrow for age with trilineage hematopoiesis and mild megakaryocytic hyperplasia with focal atypia.  There was no morphologic evidence of a myeloproliferative disorder.  Iron stains were inadequate for evaluation of storage iron.  There was no increase in reticulin fibers.  She has been on agrylin since 2012.  Bone marrow on 09/25/2012 revealed a persistent myeloproliferative disorder s/p Agrylin.  The current features were best classified as persistent myeloproliferative neoplasm (MPN) JAK2 V617F mutation positive.  Marrow was hypercellular for age (30-50%) with increased trilineage hematopoiesis and markedly increased atypical megakaryopoiesis with frequent clustering.  There were no increased blasts.  Iron stores were absent.  There was moderate to severe myelofibrosis (grade MF 2-3).  Differential included ET, PV, primary myelofibrosis, and MPN, unclassifiable.  Cytogenetics were normal (17, XX).  She developed  pancytopenia in 12/2015.  Etiology is unclear (medication confusion or hydroxyurea + Agrylin).  She received Procrit 40,000 units (intermittently 09/2012 - 06/2016), Venofer (09/2012 - 10/2012), and Infed (12/2015 - 03/2016).  She received Neupogen during a period of neutropenia. She was anagrelide from 2004 - 08/13/2017.  She began [place] on 08/14/2017.  She began a phlebotomy program on 08/06/2017.  She undergoes small volume (150 cc with replacement of 150 cc NS) if her HCT > 42.  Ferritin has been followed: 529 on 09/03/2017, 1020 on 12/03/2017, and 423,000 on 02/18/2018.  Symptomatically, she is doing well.  Her biggest complaint is loss of hearing.  Exam reveals no adenopathy or hepatosplenomegaly.  Plan: 1. Labs today: CBC with diff, CMP. 2. Polycythemia rubra vera Hemoglobin 12.9, hematocrit 41.5, MCV 88.7. Hematocrit goal  < 42. No therapeutic phlebotomy needed.  Platelet count 574,000. Platelet count goal < 400,000.  Platelet count decreasing.  Few missed doses, but appears to have received all doses in past 2 weeks.  Continues Jakafi 5 mg twice daily.  Anticipate increasing Jakafi to 10 mg BID if remains above goal at next visit.  WBC 14,400.  WBC has ranged between 12,200 - 16,300 since 12/2017. Etiology secondary to PV. Continue to monitor.  Jakafi associated with increased cholesterol and hypertriglyceridemia   Monitor fasting lipid panel periodically. 3. Renal insufficiency BUN 23 and creatinine 1.08 mg/dL. Continue to encourage fluids. Patient is followed by Dr Holley Raring, nephrology. Anticipate close monitoring if Jakafi dose increased at next visit secondary to prior history of Jakafi toxicity with decreased renal function. 4. Hyperparathyroidism Calcium 9.2. Patient on calcitriol 0.25 mg on Monday, Wednesday, and Friday.  Patient managed by Dr. Holley Raring. 5. Presbycusis  Patient notes biggest concern is decreased hearing.  Refer ENT for audiology evaluation. 6.   RTC in 3 weeks for labs (CBC with diff, fasting lipid panel). 7.  RTC in 6 weeks for MD assessment, labs (CBC with diff, CMP), and possible adjustment of Jakafi.   Honor Loh, NP  05/06/2018, 11:23 AM   I saw and evaluated the patient, participating in the key portions of the service and reviewing pertinent diagnostic studies and records.  I reviewed the nurse practitioner's note and agree with the findings and the plan.  The assessment and plan were discussed with the patient. Multiple questions were asked by the patient and answered.   Nolon Stalls, MD 05/06/2018,11:23 AM

## 2018-05-20 ENCOUNTER — Ambulatory Visit: Payer: Medicare Other | Admitting: Hematology and Oncology

## 2018-05-20 ENCOUNTER — Other Ambulatory Visit: Payer: Medicare Other

## 2018-05-22 ENCOUNTER — Other Ambulatory Visit: Payer: Self-pay | Admitting: Urgent Care

## 2018-05-22 DIAGNOSIS — D45 Polycythemia vera: Secondary | ICD-10-CM

## 2018-05-26 MED FILL — JAKAFI 5 MG TABLET: 5 | 30 days supply | Qty: 60 | Fill #0

## 2018-05-27 ENCOUNTER — Telehealth: Payer: Self-pay

## 2018-05-27 ENCOUNTER — Inpatient Hospital Stay: Payer: Medicare Other

## 2018-05-27 DIAGNOSIS — Z79899 Other long term (current) drug therapy: Secondary | ICD-10-CM | POA: Diagnosis not present

## 2018-05-27 DIAGNOSIS — H9113 Presbycusis, bilateral: Secondary | ICD-10-CM | POA: Diagnosis not present

## 2018-05-27 DIAGNOSIS — D45 Polycythemia vera: Secondary | ICD-10-CM

## 2018-05-27 DIAGNOSIS — E213 Hyperparathyroidism, unspecified: Secondary | ICD-10-CM | POA: Diagnosis not present

## 2018-05-27 DIAGNOSIS — N289 Disorder of kidney and ureter, unspecified: Secondary | ICD-10-CM | POA: Diagnosis not present

## 2018-05-27 DIAGNOSIS — D7581 Myelofibrosis: Secondary | ICD-10-CM | POA: Diagnosis not present

## 2018-05-27 LAB — LIPID PANEL
Cholesterol: 279 mg/dL — ABNORMAL HIGH (ref 0–200)
HDL: 72 mg/dL (ref >40)
LDL Cholesterol: 182 mg/dL — ABNORMAL HIGH (ref 0–99)
Total CHOL/HDL Ratio: 3.9 RATIO
Triglycerides: 125 mg/dL (ref <150)
VLDL: 25 mg/dL (ref 0–40)

## 2018-05-27 LAB — CBC WITH DIFFERENTIAL/PLATELET
Abs Immature Granulocytes: 0.8 10*3/uL — ABNORMAL HIGH (ref 0.00–0.07)
Basophils Absolute: 0.2 10*3/uL — ABNORMAL HIGH (ref 0.0–0.1)
Basophils Relative: 1 %
Eosinophils Absolute: 0.9 10*3/uL — ABNORMAL HIGH (ref 0.0–0.5)
Eosinophils Relative: 6 %
HCT: 38.5 % (ref 36.0–46.0)
Hemoglobin: 12.1 g/dL (ref 12.0–15.0)
Immature Granulocytes: 6 %
Lymphocytes Relative: 16 %
Lymphs Abs: 2.2 10*3/uL (ref 0.7–4.0)
MCH: 27.4 pg (ref 26.0–34.0)
MCHC: 31.4 g/dL (ref 30.0–36.0)
MCV: 87.1 fL (ref 80.0–100.0)
Monocytes Absolute: 1.1 10*3/uL — ABNORMAL HIGH (ref 0.1–1.0)
Monocytes Relative: 8 %
Neutro Abs: 8.8 10*3/uL — ABNORMAL HIGH (ref 1.7–7.7)
Neutrophils Relative %: 63 %
Platelets: 641 10*3/uL — ABNORMAL HIGH (ref 150–400)
RBC: 4.42 MIL/uL (ref 3.87–5.11)
RDW: 22.1 % — ABNORMAL HIGH (ref 11.5–15.5)
WBC: 14 10*3/uL — ABNORMAL HIGH (ref 4.0–10.5)
nRBC: 0 % (ref 0.0–0.2)

## 2018-05-27 NOTE — Telephone Encounter (Signed)
-----   Message from Lequita Asal, MD sent at 05/27/2018  3:53 PM EST ----- Regarding: Please call patient  Is patient taking her Jakafi?  Platelet count up.  M ----- Message ----- From: Buel Ream, Lab In [place] Sent: 05/27/2018   9:05 AM EST To: Lequita Asal, MD

## 2018-05-27 NOTE — Telephone Encounter (Signed)
Spoke with Laura Wilcox to inform her that she need a PCP to follow her normal labs, At this time the patient does not have a PCP she states when she get a PCP she will contact our office and inform the office. The patient was understanding and agreeable to find a PCP as soon as possible.

## 2018-05-27 NOTE — Telephone Encounter (Signed)
Spoke with Laura Wilcox to see if she was still taking her Jakafi. The patient does report she has been taking every dose and has not missed one does. The patient platelets has raised from 574 to 641. The patient was understanding and agreeable to continue to take the [facility] until further notice.

## 2018-06-03 DIAGNOSIS — H6123 Impacted cerumen, bilateral: Secondary | ICD-10-CM | POA: Diagnosis not present

## 2018-06-03 DIAGNOSIS — H903 Sensorineural hearing loss, bilateral: Secondary | ICD-10-CM | POA: Diagnosis not present

## 2018-06-16 ENCOUNTER — Inpatient Hospital Stay (HOSPITAL_BASED_OUTPATIENT_CLINIC_OR_DEPARTMENT_OTHER): Payer: Medicare Other | Admitting: Urgent Care

## 2018-06-16 ENCOUNTER — Inpatient Hospital Stay: Payer: Medicare Other | Attending: Hematology and Oncology

## 2018-06-16 ENCOUNTER — Other Ambulatory Visit: Payer: Self-pay

## 2018-06-16 VITALS — BP 90/60 | HR 80 | Temp 97.4°F | Resp 18 | Wt 112.8 lb

## 2018-06-16 DIAGNOSIS — Z87891 Personal history of nicotine dependence: Secondary | ICD-10-CM | POA: Insufficient documentation

## 2018-06-16 DIAGNOSIS — N289 Disorder of kidney and ureter, unspecified: Secondary | ICD-10-CM | POA: Diagnosis not present

## 2018-06-16 DIAGNOSIS — H9113 Presbycusis, bilateral: Secondary | ICD-10-CM

## 2018-06-16 DIAGNOSIS — D45 Polycythemia vera: Secondary | ICD-10-CM | POA: Diagnosis not present

## 2018-06-16 DIAGNOSIS — E46 Unspecified protein-calorie malnutrition: Secondary | ICD-10-CM

## 2018-06-16 DIAGNOSIS — E213 Hyperparathyroidism, unspecified: Secondary | ICD-10-CM | POA: Insufficient documentation

## 2018-06-16 LAB — COMPREHENSIVE METABOLIC PANEL WITH GFR
ALT: 12 U/L (ref 0–44)
AST: 21 U/L (ref 15–41)
Albumin: 4.2 g/dL (ref 3.5–5.0)
Alkaline Phosphatase: 97 U/L (ref 38–126)
Anion gap: 9 (ref 5–15)
BUN: 29 mg/dL — ABNORMAL HIGH (ref 8–23)
CO2: 26 mmol/L (ref 22–32)
Calcium: 9.1 mg/dL (ref 8.9–10.3)
Chloride: 100 mmol/L (ref 98–111)
Creatinine, Ser: 1.19 mg/dL — ABNORMAL HIGH (ref 0.44–1.00)
GFR calc Af Amer: 47 mL/min — ABNORMAL LOW (ref >60)
GFR calc non Af Amer: 40 mL/min — ABNORMAL LOW (ref >60)
Glucose, Bld: 86 mg/dL (ref 70–99)
Potassium: 4.3 mmol/L (ref 3.5–5.1)
Sodium: 135 mmol/L (ref 135–145)
Total Bilirubin: 0.3 mg/dL (ref 0.3–1.2)
Total Protein: 7.5 g/dL (ref 6.5–8.1)

## 2018-06-16 LAB — CBC WITH DIFFERENTIAL/PLATELET
Abs Immature Granulocytes: 0.6 10*3/uL — ABNORMAL HIGH (ref 0.00–0.07)
Band Neutrophils: 6 %
Basophils Absolute: 0.1 10*3/uL (ref 0.0–0.1)
Basophils Relative: 1 %
Eosinophils Absolute: 0.3 10*3/uL (ref 0.0–0.5)
Eosinophils Relative: 2 %
HCT: 38.5 % (ref 36.0–46.0)
Hemoglobin: 11.7 g/dL — ABNORMAL LOW (ref 12.0–15.0)
Lymphocytes Relative: 17 %
Lymphs Abs: 2.5 10*3/uL (ref 0.7–4.0)
MCH: 26.9 pg (ref 26.0–34.0)
MCHC: 30.4 g/dL (ref 30.0–36.0)
MCV: 88.5 fL (ref 80.0–100.0)
Metamyelocytes Relative: 3 %
Monocytes Absolute: 1.3 10*3/uL — ABNORMAL HIGH (ref 0.1–1.0)
Monocytes Relative: 9 %
Myelocytes: 1 %
Neutro Abs: 9.8 10*3/uL — ABNORMAL HIGH (ref 1.7–7.7)
Neutrophils Relative %: 61 %
Platelets: 794 10*3/uL — ABNORMAL HIGH (ref 150–400)
RBC: 4.35 MIL/uL (ref 3.87–5.11)
RDW: 23.3 % — ABNORMAL HIGH (ref 11.5–15.5)
Smear Review: INCREASED
WBC: 14.6 10*3/uL — ABNORMAL HIGH (ref 4.0–10.5)
nRBC: 0 % (ref 0.0–0.2)

## 2018-06-16 MED ORDER — RUXOLITINIB PHOSPHATE 10 MG PO TABS: 10.0000 mg | ORAL_TABLET | Freq: Two times a day (BID) | ORAL | 1 refills | Status: DC

## 2018-06-16 NOTE — Progress Notes (Signed)
Pt here for follow up. Denies any concerns.  

## 2018-06-16 NOTE — Patient Instructions (Signed)
Platelets are HIGH. Will increase you Jakafi to 10 mg BID. I will have them send you new pills. Until then, make sure you are taking 2 pills (10 mg total) twice a day.  Once you receive the new pills, it will only be 1 pill (10 mg total) twice a day.   Honor Loh, MSN, APRN, FNP-C, CEN Oncology/Hematology Nurse Practitioner  [place] 06/16/18, 12:03 PM

## 2018-06-16 NOTE — Progress Notes (Signed)
[facility] [address], [place], [place] [address] Phone: [phone]  Fax: [phone]    Clinic day:  06/16/2018   Chief Complaint: Laura Wilcox is a 85 y.o. female with polycythemia rubra vera (PV) on Jakafi who is seen for a 6-week assessment on Jakafi.  HPI:  The patient was last seen in the hematology clinic on 05/06/2018.  At that time, patient was doing well.  She denied any acute concerns.  She denied any B symptoms or interval infections.  No increased bruising or bleeding.  Patient complained of increasing issues with presbycusis.  Previous audiology referral prior to moving to [place] resulted and recommendations for bilateral hearing aids.  Patient eating well; weight down 2 pounds.  Exam grossly unremarkable.  WBC 14,400 ([place] 9600).  Platelets 574,000.  Creatinine 1.08 mg/dL.  She continues on Jakafi 5 mg twice daily.  Repeat labs on 05/27/2018 revealed a WBC of 14,000 ([place] 8800).  Platelets 641,000.  Fasting lipid panel revealed a total cholesterol 279 mg/dL (HDL 72 and LDL 182).  In the interim, patient has continued to do well.  She denies any acute concerns. Patient denies bleeding; no hematochezia, melena, or gross hematuria. Patient denies that she has experienced any B symptoms. She denies any interval infections.   Patient continues to have issues with her hearing.  She has been referred to audiology for consideration of bilateral hearing aids secondary to her worsening presbycusis.  Patient advises that she maintains an adequate appetite. She is eating well. Weight today is 112 lb 12.2 oz (51.1 kg), which compared to her last visit to the clinic, represents a 2 pound increase.  Per daughter's report, patient is not drinking enough fluids.  Patient denies pain in the clinic today.  Past Medical History:  Diagnosis Date  . Polycythemia     Past Surgical History:  Procedure Laterality Date  . ABDOMINAL HYSTERECTOMY     complete  .  BREAST SURGERY      Family History  Problem Relation Age of Onset  . Heart disease Father   . Cancer Sister        breast  . Cancer Sister        breast  . Alzheimer's disease Sister     Social History:  reports that she quit smoking about 40 years ago. She has a 30.00 pack-year smoking history. She has never used smokeless tobacco. She reports that she does not drink alcohol or use drugs.  Former smoker (1/2 pack/day) from age 64s to 63s. She quit smoking in 1980.  She occasionally drinks a glass of wine.  She is widowed.  Patient is retired Sport and exercise psychologist. Patient moved to [facility] in 03/2017.  She lives independently in her own apartment in [place]. Her daughter lives next door.  Patient denies known exposures to radiation on toxins. The patient is accompanied by her daughter today.  Allergies: No Known Allergies  Current Medications: Current Outpatient Medications  Medication Sig Dispense Refill  . calcitRIOL (ROCALTROL) 0.25 MCG capsule Take 0.25 mcg by mouth 3 (three) times a week.    . ruxolitinib phosphate (JAKAFI) 10 MG tablet Take 1 tablet (10 mg total) by mouth 2 (two) times daily. 60 tablet 1   No current facility-administered medications for this visit.     Review of Systems  Constitutional: Negative for chills, diaphoresis, fever, malaise/fatigue and weight loss (up 2 pounds).       "I am feeling good.  Nothing is bothering me today".  HENT: Positive for hearing loss. Negative for congestion, ear discharge, ear pain, nosebleeds, sinus pain and sore throat.   Eyes: Negative.  Negative for blurred vision, double vision, photophobia and pain.  Respiratory: Negative.  Negative for cough, hemoptysis, sputum production and shortness of breath.   Cardiovascular: Negative.  Negative for chest pain, palpitations, orthopnea, leg swelling and PND.  Gastrointestinal: Negative.  Negative for abdominal pain, blood in stool, constipation, diarrhea, heartburn, melena, nausea and vomiting.   Genitourinary: Negative.  Negative for dysuria, frequency, hematuria and urgency.  Musculoskeletal: Negative.  Negative for back pain, falls, joint pain, myalgias and neck pain.  Skin: Negative.  Negative for itching and rash.  Neurological: Negative.  Negative for dizziness, tingling, tremors, sensory change, speech change, focal weakness, weakness and headaches.  Endo/Heme/Allergies: Bruises/bleeds easily.  Psychiatric/Behavioral: Negative.  Negative for depression and memory loss. The patient is not nervous/anxious and does not have insomnia.   All other systems reviewed and are negative.  Performance status (ECOG): 1 - 2  Vital Signs BP 90/60 (BP Location: Left Arm, Patient Position: Sitting) Comment: Standing: 91/64  Pulse 80 Comment: Standing: 89  Temp (!) 97.4 F (36.3 C) (Tympanic)   Resp 18   Wt 112 lb 12.2 oz (51.1 kg)   SpO2 100%   BMI 17.66 kg/m   Physical Exam  Constitutional: She is oriented to person, place, and time. She appears malnourished.  HENT:  Head: Normocephalic and atraumatic.  Mouth/Throat: Oropharynx is clear and moist and mucous membranes are normal.  Eyes: Pupils are equal, round, and reactive to light. EOM are normal. No scleral icterus.  Neck: Normal range of motion. Neck supple. No tracheal deviation present. No thyromegaly present.  Cardiovascular: Normal rate, regular rhythm, normal heart sounds and intact distal pulses. Exam reveals no gallop and no friction rub.  No murmur heard. Pulmonary/Chest: Effort normal and breath sounds normal. No respiratory distress. She has no wheezes. She has no rales.  Abdominal: Soft. Bowel sounds are normal. She exhibits no distension. There is no abdominal tenderness.  Musculoskeletal: Normal range of motion.        General: No tenderness or edema.  Neurological: She is alert and oriented to person, place, and time.  Skin: Skin is warm and dry. Bruising (scattered) noted. No rash noted. No erythema.   Psychiatric: Mood, affect and judgment normal.  Nursing note and vitals reviewed.   Appointment on 06/16/2018  Component Date Value Ref Range Status  . Sodium 06/16/2018 135  135 - 145 mmol/L Final  . Potassium 06/16/2018 4.3  3.5 - 5.1 mmol/L Final  . Chloride 06/16/2018 100  98 - 111 mmol/L Final  . CO2 06/16/2018 26  22 - 32 mmol/L Final  . Glucose, Bld 06/16/2018 86  70 - 99 mg/dL Final  . BUN 06/16/2018 29* 8 - 23 mg/dL Final  . Creatinine, Ser 06/16/2018 1.19* 0.44 - 1.00 mg/dL Final  . Calcium 06/16/2018 9.1  8.9 - 10.3 mg/dL Final  . Total Protein 06/16/2018 7.5  6.5 - 8.1 g/dL Final  . Albumin 06/16/2018 4.2  3.5 - 5.0 g/dL Final  . AST 06/16/2018 21  15 - 41 U/L Final  . ALT 06/16/2018 12  0 - 44 U/L Final  . Alkaline Phosphatase 06/16/2018 97  38 - 126 U/L Final  . Total Bilirubin 06/16/2018 0.3  0.3 - 1.2 mg/dL Final  . GFR calc non Af Amer 06/16/2018 40* >60 mL/min Final  . GFR calc Af Amer 06/16/2018 47* >60 mL/min  Final  . Anion gap 06/16/2018 9  5 - 15 Final   Performed at [facility], [address], [place], [place] [address]  . WBC 06/16/2018 14.6* 4.0 - 10.5 K/uL Final  . RBC 06/16/2018 4.35  3.87 - 5.11 MIL/uL Final  . Hemoglobin 06/16/2018 11.7* 12.0 - 15.0 g/dL Final  . HCT 06/16/2018 38.5  36.0 - 46.0 % Final  . MCV 06/16/2018 88.5  80.0 - 100.0 fL Final  . MCH 06/16/2018 26.9  26.0 - 34.0 pg Final  . MCHC 06/16/2018 30.4  30.0 - 36.0 g/dL Final  . RDW 06/16/2018 23.3* 11.5 - 15.5 % Final  . Platelets 06/16/2018 794* 150 - 400 K/uL Final  . nRBC 06/16/2018 0.0  0.0 - 0.2 % Final  . Neutrophils Relative % 06/16/2018 61  % Final  . Neutro Abs 06/16/2018 9.8* 1.7 - 7.7 K/uL Final  . Band Neutrophils 06/16/2018 6  % Final  . Lymphocytes Relative 06/16/2018 17  % Final  . Lymphs Abs 06/16/2018 2.5  0.7 - 4.0 K/uL Final  . Monocytes Relative 06/16/2018 9  % Final  . Monocytes Absolute 06/16/2018 1.3* 0.1 - 1.0 K/uL Final  . Eosinophils  Relative 06/16/2018 2  % Final  . Eosinophils Absolute 06/16/2018 0.3  0.0 - 0.5 K/uL Final  . Basophils Relative 06/16/2018 1  % Final  . Basophils Absolute 06/16/2018 0.1  0.0 - 0.1 K/uL Final  . Smear Review 06/16/2018 PLATELETS APPEAR INCREASED   Final  . Metamyelocytes Relative 06/16/2018 3  % Final  . Myelocytes 06/16/2018 1  % Final  . Abs Immature Granulocytes 06/16/2018 0.60* 0.00 - 0.07 K/uL Final   Performed at [facility], [address], [place], [place] [address]    Assessment:  Laura Wilcox is a 85 y.o. female with polycythemia rubra vera and secondary myelofibrosis.  She presented in 04/2002 with thrombocytosis (835,000).  JAK2 V617F was positive on 05/20/2012.  Over the past year, hematocrit has ranged 37.5 - 42.4, hemoglobin 12.1 - 13.4, platelets 458,000 - 569,000, WBC 10,500 - 15,700.  Creatinine has been 1.0 - 1.2.  Bone marrow  on 04/15/2002 revealed a normocellular marrow for age with trilineage hematopoiesis and mild megakaryocytic hyperplasia with focal atypia.  There was no morphologic evidence of a myeloproliferative disorder.  Iron stains were inadequate for evaluation of storage iron.  There was no increase in reticulin fibers.  She has been on agrylin since 2012.  Bone marrow on 09/25/2012 revealed a persistent myeloproliferative disorder s/p Agrylin.  The current features were best classified as persistent myeloproliferative neoplasm (MPN) JAK2 V617F mutation positive.  Marrow was hypercellular for age (30-50%) with increased trilineage hematopoiesis and markedly increased atypical megakaryopoiesis with frequent clustering.  There were no increased blasts.  Iron stores were absent.  There was moderate to severe myelofibrosis (grade MF 2-3).  Differential included ET, PV, primary myelofibrosis, and MPN, unclassifiable.  Cytogenetics were normal (52, XX).  She developed pancytopenia in 12/2015.  Etiology is unclear (medication confusion or hydroxyurea  + Agrylin).  She received Procrit 40,000 units (intermittently 09/2012 - 06/2016), Venofer (09/2012 - 10/2012), and Infed (12/2015 - 03/2016).  She received Neupogen during a period of neutropenia. She was anagrelide from 2004 - 08/13/2017.  She began [place] on 08/14/2017.  She began a phlebotomy program on 08/06/2017.  She undergoes small volume (150 cc with replacement of 150 cc NS) if her HCT > 42.  Ferritin has been followed: 529 on 09/03/2017,  1020 on 12/03/2017, and 423,000 on 02/18/2018.  Symptomatically, patient is doing well overall.  She denies any acute concerns.  No increased bleeding on her current Jakafi 5 mg twice daily.  Patient eating well; weight up 2 pounds.  Exam reveals scattered bruising.  WBC 14,600 ([place] 9800).  Hemoglobin 11.7, hematocrit 38.5, and platelets 794,000.  BUN 29 and creatinine 1.19 mg/dL. Estimated Creatinine Clearance: 25.9 mL/min (A) (by C-G formula based on SCr of 1.19 mg/dL (H)).   Plan: 1. Labs today: CBC with diff, CMP. 2. Polycythemia rubra vera  Patient is doing well overall.  No bleeding.  Scattered bruising.  Hemoglobin 11.7, hematocrit 38.5.  Hematocrit goal < 42  No therapeutic phlebotomy required  Platelet count elevated at 794,000.  Patient and daughter deny any missed doses.  Currently on Jakafi 5mg  twice daily.  Increase Jakafi dose to 10 mg twice daily.    New prescription sent. 3. Renal insufficiency  BUN 29 and creatinine 1.19 mg/dL.  Estimated Creatinine Clearance: 25.9 mL/min (A) (by C-G formula based on SCr of 1.19 mg/dL (H)).  Daughter notes that patient is not drinking enough fluids.  Encourage patient increase fluid intake to prevent further dehydration.  Patient followed by nephrology Holley Raring, MD). 4. Protein calorie malnutrition  Weight up 2 pounds since last visit.  Her weight today is 112 lb 12.2 oz (51.1 kg). Her BMI of 17.66 kg/m places her in the underweight category.   Encouraged her to increase  her intake of calorie and protein dense food choices.   Encouraged to utilize nutritional supplement shakes at least 2-3 times a day.  5. Hyperparathyroidism  Calcium 9.1 mg/dL.  Patient continues on calcitriol 0.25 mg on Monday, Wednesday, and Friday.  Condition managed by nephrology Holley Raring, MD). 6. Presbycusis  Patient has been referred to audiology for consideration of bilateral hearing aids. 7. RTC in 2 weeks for labs (CBC with diff). 8. RTC in 4 weeks for MD assessment, labs (CBC with diff, CMP).   Honor Loh, NP  06/16/2018, 1:46 PM

## 2018-06-29 ENCOUNTER — Other Ambulatory Visit: Payer: Self-pay

## 2018-06-30 ENCOUNTER — Inpatient Hospital Stay: Payer: Medicare Other

## 2018-06-30 DIAGNOSIS — E213 Hyperparathyroidism, unspecified: Secondary | ICD-10-CM | POA: Diagnosis not present

## 2018-06-30 DIAGNOSIS — Z87891 Personal history of nicotine dependence: Secondary | ICD-10-CM | POA: Diagnosis not present

## 2018-06-30 DIAGNOSIS — N289 Disorder of kidney and ureter, unspecified: Secondary | ICD-10-CM | POA: Diagnosis not present

## 2018-06-30 DIAGNOSIS — H9113 Presbycusis, bilateral: Secondary | ICD-10-CM | POA: Diagnosis not present

## 2018-06-30 DIAGNOSIS — E46 Unspecified protein-calorie malnutrition: Secondary | ICD-10-CM | POA: Diagnosis not present

## 2018-06-30 DIAGNOSIS — D45 Polycythemia vera: Secondary | ICD-10-CM | POA: Diagnosis not present

## 2018-06-30 LAB — CBC WITH DIFFERENTIAL/PLATELET
Abs Immature Granulocytes: 0.85 10*3/uL — ABNORMAL HIGH (ref 0.00–0.07)
Basophils Absolute: 0.3 10*3/uL — ABNORMAL HIGH (ref 0.0–0.1)
Basophils Relative: 2 %
Eosinophils Absolute: 0.9 10*3/uL — ABNORMAL HIGH (ref 0.0–0.5)
Eosinophils Relative: 6 %
HCT: 35.8 % — ABNORMAL LOW (ref 36.0–46.0)
Hemoglobin: 11.3 g/dL — ABNORMAL LOW (ref 12.0–15.0)
Immature Granulocytes: 6 %
Lymphocytes Relative: 18 %
Lymphs Abs: 2.6 10*3/uL (ref 0.7–4.0)
MCH: 28 pg (ref 26.0–34.0)
MCHC: 31.6 g/dL (ref 30.0–36.0)
MCV: 88.8 fL (ref 80.0–100.0)
Monocytes Absolute: 1.2 10*3/uL — ABNORMAL HIGH (ref 0.1–1.0)
Monocytes Relative: 8 %
Neutro Abs: 8.5 10*3/uL — ABNORMAL HIGH (ref 1.7–7.7)
Neutrophils Relative %: 60 %
Platelets: 675 10*3/uL — ABNORMAL HIGH (ref 150–400)
RBC: 4.03 MIL/uL (ref 3.87–5.11)
RDW: 23.1 % — ABNORMAL HIGH (ref 11.5–15.5)
WBC: 14.3 10*3/uL — ABNORMAL HIGH (ref 4.0–10.5)
nRBC: 0 % (ref 0.0–0.2)

## 2018-07-06 MED FILL — JAKAFI 10 MG TABLET: 10 | 30 days supply | Qty: 60 | Fill #0

## 2018-07-13 ENCOUNTER — Other Ambulatory Visit: Payer: Self-pay

## 2018-07-13 ENCOUNTER — Inpatient Hospital Stay: Payer: Medicare Other | Attending: Hematology and Oncology

## 2018-07-13 DIAGNOSIS — Z79899 Other long term (current) drug therapy: Secondary | ICD-10-CM | POA: Diagnosis not present

## 2018-07-13 DIAGNOSIS — D45 Polycythemia vera: Secondary | ICD-10-CM

## 2018-07-13 DIAGNOSIS — E213 Hyperparathyroidism, unspecified: Secondary | ICD-10-CM | POA: Insufficient documentation

## 2018-07-13 DIAGNOSIS — N289 Disorder of kidney and ureter, unspecified: Secondary | ICD-10-CM | POA: Insufficient documentation

## 2018-07-13 DIAGNOSIS — Z87891 Personal history of nicotine dependence: Secondary | ICD-10-CM | POA: Diagnosis not present

## 2018-07-13 LAB — CBC WITH DIFFERENTIAL/PLATELET
Abs Immature Granulocytes: 0.65 10*3/uL — ABNORMAL HIGH (ref 0.00–0.07)
Basophils Absolute: 0.3 10*3/uL — ABNORMAL HIGH (ref 0.0–0.1)
Basophils Relative: 2 %
Eosinophils Absolute: 0.6 10*3/uL — ABNORMAL HIGH (ref 0.0–0.5)
Eosinophils Relative: 5 %
HCT: 36.1 % (ref 36.0–46.0)
Hemoglobin: 11.2 g/dL — ABNORMAL LOW (ref 12.0–15.0)
Immature Granulocytes: 5 %
Lymphocytes Relative: 15 %
Lymphs Abs: 2 10*3/uL (ref 0.7–4.0)
MCH: 27.5 pg (ref 26.0–34.0)
MCHC: 31 g/dL (ref 30.0–36.0)
MCV: 88.5 fL (ref 80.0–100.0)
Monocytes Absolute: 1.1 10*3/uL — ABNORMAL HIGH (ref 0.1–1.0)
Monocytes Relative: 8 %
Neutro Abs: 8.9 10*3/uL — ABNORMAL HIGH (ref 1.7–7.7)
Neutrophils Relative %: 65 %
Platelets: 507 10*3/uL — ABNORMAL HIGH (ref 150–400)
RBC: 4.08 MIL/uL (ref 3.87–5.11)
RDW: 23 % — ABNORMAL HIGH (ref 11.5–15.5)
WBC: 13.5 10*3/uL — ABNORMAL HIGH (ref 4.0–10.5)
nRBC: 0 % (ref 0.0–0.2)

## 2018-07-13 LAB — COMPREHENSIVE METABOLIC PANEL
ALT: 14 U/L (ref 0–44)
AST: 20 U/L (ref 15–41)
Albumin: 4.2 g/dL (ref 3.5–5.0)
Alkaline Phosphatase: 86 U/L (ref 38–126)
Anion gap: 7 (ref 5–15)
BUN: 29 mg/dL — ABNORMAL HIGH (ref 8–23)
CO2: 26 mmol/L (ref 22–32)
Calcium: 9 mg/dL (ref 8.9–10.3)
Chloride: 102 mmol/L (ref 98–111)
Creatinine, Ser: 1.16 mg/dL — ABNORMAL HIGH (ref 0.44–1.00)
GFR calc Af Amer: 48 mL/min — ABNORMAL LOW (ref >60)
GFR calc non Af Amer: 42 mL/min — ABNORMAL LOW (ref >60)
Glucose, Bld: 94 mg/dL (ref 70–99)
Potassium: 4.1 mmol/L (ref 3.5–5.1)
Sodium: 135 mmol/L (ref 135–145)
Total Bilirubin: 0.6 mg/dL (ref 0.3–1.2)
Total Protein: 7.1 g/dL (ref 6.5–8.1)

## 2018-07-13 NOTE — Progress Notes (Signed)
[facility]  [address], [place], [place] [address] Phone: [phone]  Fax: [phone]   Telemedicine Office Visit:  07/14/2018  Referring physician: No ref. provider found  I connected with Laura Wilcox on 07/14/18 at 11:53 AM EDT by videoconferencing and verified that I was speaking with the correct person using 2 identifiers.  The patient was at home.  I discussed the limitations, risk, security and privacy concerns of performing an evaluation and management service by videoconferencing and the availability of in person appointments.  I also discussed with the patient that there may be a patient responsible charge related to this service.  The patient expressed understanding and agreed to proceed.   Chief Complaint: Laura Wilcox is a 85 y.o. female with polycythemia rubra vera (PV) on Jakafi who is seen for 1 month assessment on Jakafi.  HPI:  The patient was last seen in the hematology clinic on 06/16/2018 with Laura Loh, Laura Wilcox.  At that time, she denied any concerns.  She denied any increased bleeding on Jakafi 5 mg twice daily.  She was eating well; weight up 2 pounds.  Exam revealed scattered bruising.  WBC was 14,600 ([place] 9800).  Hemoglobin was 11.7, hematocrit 38.5, and platelets 794,000.  BUN 29 and creatinine 1.19 mg/dL.   Jakafi was increased to 10 mg BID on 06/17/2018.  CBC on 06/30/2018 revealed a hematocrit of 35.8, hemoglobin 11.3, MCV 88.8, platelets 675,000, WBC 14,300 with an ANC of 8500.  During the interim, she has done well.  She notes "no difference".  She denies any fever sweats or weight loss.  She is drinking her fluids.  Her daughter continues to encourage more fluid intake.  Since last visit, she missed 1 dose of Jakafi last Sunday and 1 dose the week before.  She denies any bleeding.   Past Medical History:  Diagnosis Date  . Polycythemia     Past Surgical History:  Procedure Laterality Date  . ABDOMINAL  HYSTERECTOMY     complete  . BREAST SURGERY      Family History  Problem Relation Age of Onset  . Heart disease Father   . Cancer Sister        breast  . Cancer Sister        breast  . Alzheimer's disease Sister     Social History:  reports that she quit smoking about 40 years ago. She has a 30.00 pack-year smoking history. She has never used smokeless tobacco. She reports that she does not drink alcohol or use drugs.  Former smoker (1/2 pack/day) from age 89s to 74s. She quit smoking in 1980.  She occasionally drinks a glass of wine.  She is widowed.  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  She lives independently in her own apartment in [place]. Her daughter lives next door.  Patient denies known exposures to radiation on toxins.   Participants in the patient's visit and their role in the encounter included the patient, her daughter, and Vito Berger, CMA, today.  The intake visit was provided by Vito Berger, CMA.    Allergies: No Known Allergies  Current Medications: Current Outpatient Medications  Medication Sig Dispense Refill  . calcitRIOL (ROCALTROL) 0.25 MCG capsule Take 0.25 mcg by mouth 3 (three) times a week.    . ruxolitinib phosphate (JAKAFI) 10 MG tablet Take 1 tablet (10 mg total) by mouth 2 (two) times daily. 60 tablet 1   No current facility-administered medications  for this visit.     Review of Systems  Constitutional: Negative.  Negative for chills, diaphoresis, fever, malaise/fatigue and weight loss.       Feels "good".  HENT: Positive for hearing loss. Negative for congestion, ear discharge, ear pain, nosebleeds, sinus pain and sore throat.   Eyes: Negative.  Negative for blurred vision, double vision, photophobia and pain.  Respiratory: Negative.  Negative for cough, hemoptysis, sputum production and shortness of breath.   Cardiovascular: Negative.  Negative for chest pain, palpitations, orthopnea, leg swelling and PND.   Gastrointestinal: Negative.  Negative for abdominal pain, blood in stool, constipation, diarrhea, heartburn, melena, nausea and vomiting.  Genitourinary: Negative.  Negative for dysuria, frequency, hematuria and urgency.  Musculoskeletal: Negative.  Negative for back pain, falls, joint pain, myalgias and neck pain.  Skin: Negative.  Negative for itching and rash.  Neurological: Negative.  Negative for dizziness, tingling, tremors, sensory change, speech change, focal weakness, weakness and headaches.  Psychiatric/Behavioral: Negative.  Negative for depression and memory loss. The patient is not nervous/anxious and does not have insomnia.   All other systems reviewed and are negative.  Performance status (ECOG): 1-2  Vital Signs There were no vitals taken for this visit.  Physical Exam  Constitutional: She is oriented to person, place, and time.  Thin woman sitting comfortably in her home in no acute distress.  HENT:  Head: Normocephalic and atraumatic.  Short blonde hair.  Eyes: Conjunctivae are normal. No scleral icterus.  Blue eyes.  Neurological: She is alert and oriented to person, place, and time.  Skin: She is not diaphoretic.  Psychiatric: Mood, affect and judgment normal.    Appointment on 07/13/2018  Component Date Value Ref Range Status  . Sodium 07/13/2018 135  135 - 145 mmol/L Final  . Potassium 07/13/2018 4.1  3.5 - 5.1 mmol/L Final  . Chloride 07/13/2018 102  98 - 111 mmol/L Final  . CO2 07/13/2018 26  22 - 32 mmol/L Final  . Glucose, Bld 07/13/2018 94  70 - 99 mg/dL Final  . BUN 07/13/2018 29* 8 - 23 mg/dL Final  . Creatinine, Ser 07/13/2018 1.16* 0.44 - 1.00 mg/dL Final  . Calcium 07/13/2018 9.0  8.9 - 10.3 mg/dL Final  . Total Protein 07/13/2018 7.1  6.5 - 8.1 g/dL Final  . Albumin 07/13/2018 4.2  3.5 - 5.0 g/dL Final  . AST 07/13/2018 20  15 - 41 U/L Final  . ALT 07/13/2018 14  0 - 44 U/L Final  . Alkaline Phosphatase 07/13/2018 86  38 - 126 U/L Final  .  Total Bilirubin 07/13/2018 0.6  0.3 - 1.2 mg/dL Final  . GFR calc non Af Amer 07/13/2018 42* >60 mL/min Final  . GFR calc Af Amer 07/13/2018 48* >60 mL/min Final  . Anion gap 07/13/2018 7  5 - 15 Final   Performed at [facility], [address], [place], [place] [address]  . WBC 07/13/2018 13.5* 4.0 - 10.5 K/uL Final  . RBC 07/13/2018 4.08  3.87 - 5.11 MIL/uL Final  . Hemoglobin 07/13/2018 11.2* 12.0 - 15.0 g/dL Final  . HCT 07/13/2018 36.1  36.0 - 46.0 % Final  . MCV 07/13/2018 88.5  80.0 - 100.0 fL Final  . MCH 07/13/2018 27.5  26.0 - 34.0 pg Final  . MCHC 07/13/2018 31.0  30.0 - 36.0 g/dL Final  . RDW 07/13/2018 23.0* 11.5 - 15.5 % Final  . Platelets 07/13/2018 507* 150 - 400 K/uL Final  . nRBC  07/13/2018 0.0  0.0 - 0.2 % Final  . Neutrophils Relative % 07/13/2018 65  % Final  . Neutro Abs 07/13/2018 8.9* 1.7 - 7.7 K/uL Final  . Lymphocytes Relative 07/13/2018 15  % Final  . Lymphs Abs 07/13/2018 2.0  0.7 - 4.0 K/uL Final  . Monocytes Relative 07/13/2018 8  % Final  . Monocytes Absolute 07/13/2018 1.1* 0.1 - 1.0 K/uL Final  . Eosinophils Relative 07/13/2018 5  % Final  . Eosinophils Absolute 07/13/2018 0.6* 0.0 - 0.5 K/uL Final  . Basophils Relative 07/13/2018 2  % Final  . Basophils Absolute 07/13/2018 0.3* 0.0 - 0.1 K/uL Final  . Immature Granulocytes 07/13/2018 5  % Final  . Abs Immature Granulocytes 07/13/2018 0.65* 0.00 - 0.07 K/uL Final   Performed at [facility], [address] [place], [place] [address]    Assessment:  Otilia Kareem is a 85 y.o. female with polycythemia rubra vera and secondary myelofibrosis.  She presented in 04/2002 with thrombocytosis (835,000).  JAK2 V617F was positive on 05/20/2012.  Over the past year, hematocrit has ranged 37.5 - 42.4, hemoglobin 12.1 - 13.4, platelets 458,000 - 569,000, WBC 10,500 - 15,700.  Creatinine has been 1.0 - 1.2.  Bone marrow  on 04/15/2002 revealed a normocellular marrow for age with  trilineage hematopoiesis and mild megakaryocytic hyperplasia with focal atypia.  There was no morphologic evidence of a myeloproliferative disorder.  Iron stains were inadequate for evaluation of storage iron.  There was no increase in reticulin fibers.  She has been on agrylin since 2012.  Bone marrow on 09/25/2012 revealed a persistent myeloproliferative disorder s/p Agrylin.  The current features were best classified as persistent myeloproliferative neoplasm (MPN) JAK2 V617F mutation positive.  Marrow was hypercellular for age (30-50%) with increased trilineage hematopoiesis and markedly increased atypical megakaryopoiesis with frequent clustering.  There were no increased blasts.  Iron stores were absent.  There was moderate to severe myelofibrosis (grade MF 2-3).  Differential included ET, PV, primary myelofibrosis, and MPN, unclassifiable.  Cytogenetics were normal (14, XX).  She developed pancytopenia in 12/2015.  Etiology is unclear (medication confusion or hydroxyurea + Agrylin).  She received Procrit 40,000 units (intermittently 09/2012 - 06/2016), Venofer (09/2012 - 10/2012), and Infed (12/2015 - 03/2016).  She received Neupogen during a period of neutropenia. She was anagrelide from 2004 - 08/13/2017.  She began [place] on 08/14/2017.  Jakafi was increased from 5 mg BID to 10 mg BID on 06/17/2018.  She began a phlebotomy program on 08/06/2017.  She undergoes small volume (150 cc with replacement of 150 cc NS) if her HCT > 42.  Ferritin has been followed: 529 on 09/03/2017, 1020 on 12/03/2017, and 423,000 on 02/18/2018.  Symptomatically, she is doing well.  She voices no complaints.  Hemoglobin is 11.2.  Platelet count is 507,000.  WBC is 13,500.  Plan: 1. Review interval labs. 2. Polycythemia rubra vera Clinically doing well. Hemoglobin 11.2, hematocrit 36.1. Hematocrit goal < 42 No phlebotomy needed Platelet count is 507,000. Patient missed 2 doses. Platelet count is improving.  Continue Jakafi 10 mg twice daily (began 06/17/2018). 3. Renal insufficiency BUN 29 and creatinine 1.16. Daughter continues to encourage fluids. Patient followed by Dr. Holley Raring.  4. Hyperparathyroidism Calcium 9.0. Patient  remains on calcitriol 0.25 mg on Monday, Wednesday, and Friday. Patient followed by Dr. Holley Raring. 5.   RTC in 2 weeks for labs (CBC with diff, BMP). 6.   RTC in 4 weeks for MD assessment and labs (CBC  with diff, CMP).   I discussed the assessment and treatment plan with the patient.  The patient was provided an opportunity to ask questions and all were answered.  The patient agreed with the plan and demonstrated an understanding of the instructions.  The patient was advised to call back or seek an in person evaluation if the symptoms worsen or if the condition fails to improve as anticipated.  I provided 15 minutes of face-to-face video visit time during this this encounter and > 50% was spent counseling as documented under my assessment and plan.  I provided these services from the [facility] office.   Lequita Asal, MD, PhD  07/14/2018, 11:53 AM

## 2018-07-14 ENCOUNTER — Other Ambulatory Visit: Payer: Medicare Other

## 2018-07-14 ENCOUNTER — Encounter: Payer: Self-pay | Admitting: Hematology and Oncology

## 2018-07-14 ENCOUNTER — Inpatient Hospital Stay (HOSPITAL_BASED_OUTPATIENT_CLINIC_OR_DEPARTMENT_OTHER): Payer: Medicare Other | Admitting: Hematology and Oncology

## 2018-07-14 DIAGNOSIS — N289 Disorder of kidney and ureter, unspecified: Secondary | ICD-10-CM

## 2018-07-14 DIAGNOSIS — D45 Polycythemia vera: Secondary | ICD-10-CM

## 2018-07-14 NOTE — Progress Notes (Signed)
No new changes noted. The patient DOB, Name and address was verified by phone today.

## 2018-07-20 DIAGNOSIS — D45 Polycythemia vera: Secondary | ICD-10-CM | POA: Diagnosis not present

## 2018-07-20 DIAGNOSIS — N2581 Secondary hyperparathyroidism of renal origin: Secondary | ICD-10-CM | POA: Diagnosis not present

## 2018-07-20 DIAGNOSIS — N183 Chronic kidney disease, stage 3 (moderate): Secondary | ICD-10-CM | POA: Diagnosis not present

## 2018-07-20 DIAGNOSIS — R809 Proteinuria, unspecified: Secondary | ICD-10-CM | POA: Diagnosis not present

## 2018-07-28 ENCOUNTER — Other Ambulatory Visit: Payer: Self-pay | Admitting: Hematology and Oncology

## 2018-07-28 ENCOUNTER — Inpatient Hospital Stay: Payer: Medicare Other

## 2018-07-28 ENCOUNTER — Other Ambulatory Visit: Payer: Self-pay

## 2018-07-28 ENCOUNTER — Telehealth: Payer: Self-pay

## 2018-07-28 DIAGNOSIS — E213 Hyperparathyroidism, unspecified: Secondary | ICD-10-CM | POA: Diagnosis not present

## 2018-07-28 DIAGNOSIS — D45 Polycythemia vera: Secondary | ICD-10-CM

## 2018-07-28 DIAGNOSIS — Z87891 Personal history of nicotine dependence: Secondary | ICD-10-CM | POA: Diagnosis not present

## 2018-07-28 DIAGNOSIS — N289 Disorder of kidney and ureter, unspecified: Secondary | ICD-10-CM | POA: Diagnosis not present

## 2018-07-28 DIAGNOSIS — Z79899 Other long term (current) drug therapy: Secondary | ICD-10-CM | POA: Diagnosis not present

## 2018-07-28 LAB — CBC WITH DIFFERENTIAL/PLATELET
Abs Immature Granulocytes: 0.84 10*3/uL — ABNORMAL HIGH (ref 0.00–0.07)
Basophils Absolute: 0.3 10*3/uL — ABNORMAL HIGH (ref 0.0–0.1)
Basophils Relative: 2 %
Eosinophils Absolute: 0.7 10*3/uL — ABNORMAL HIGH (ref 0.0–0.5)
Eosinophils Relative: 5 %
HCT: 36.5 % (ref 36.0–46.0)
Hemoglobin: 11.1 g/dL — ABNORMAL LOW (ref 12.0–15.0)
Immature Granulocytes: 6 %
Lymphocytes Relative: 20 %
Lymphs Abs: 2.6 10*3/uL (ref 0.7–4.0)
MCH: 27.2 pg (ref 26.0–34.0)
MCHC: 30.4 g/dL (ref 30.0–36.0)
MCV: 89.5 fL (ref 80.0–100.0)
Monocytes Absolute: 1.1 10*3/uL — ABNORMAL HIGH (ref 0.1–1.0)
Monocytes Relative: 9 %
Neutro Abs: 7.7 10*3/uL (ref 1.7–7.7)
Neutrophils Relative %: 58 %
Platelets: 584 10*3/uL — ABNORMAL HIGH (ref 150–400)
RBC: 4.08 MIL/uL (ref 3.87–5.11)
RDW: 23.3 % — ABNORMAL HIGH (ref 11.5–15.5)
WBC: 13.1 10*3/uL — ABNORMAL HIGH (ref 4.0–10.5)
nRBC: 0.2 % (ref 0.0–0.2)

## 2018-07-28 LAB — BASIC METABOLIC PANEL
Anion gap: 9 (ref 5–15)
BUN: 25 mg/dL — ABNORMAL HIGH (ref 8–23)
CO2: 26 mmol/L (ref 22–32)
Calcium: 9.2 mg/dL (ref 8.9–10.3)
Chloride: 99 mmol/L (ref 98–111)
Creatinine, Ser: 1.34 mg/dL — ABNORMAL HIGH (ref 0.44–1.00)
GFR calc Af Amer: 41 mL/min — ABNORMAL LOW (ref >60)
GFR calc non Af Amer: 35 mL/min — ABNORMAL LOW (ref >60)
Glucose, Bld: 117 mg/dL — ABNORMAL HIGH (ref 70–99)
Potassium: 4.3 mmol/L (ref 3.5–5.1)
Sodium: 134 mmol/L — ABNORMAL LOW (ref 135–145)

## 2018-07-28 MED ORDER — RUXOLITINIB PHOSPHATE 15 MG PO TABS: 15.0000 mg | ORAL_TABLET | Freq: Two times a day (BID) | ORAL | 0 refills | Status: DC

## 2018-07-28 NOTE — Telephone Encounter (Signed)
-----   Message from Lequita Asal, MD sent at 07/28/2018  1:25 PM EDT ----- Regarding: Please call patient's daughter  Any missed doses of Jakafi?  M ----- Message ----- From: Buel Ream, Lab [facility] [place] Sent: 07/28/2018  11:20 AM EDT To: Lequita Asal, MD

## 2018-07-28 NOTE — Telephone Encounter (Signed)
Left message on voice mail to see if the patient has missed a dose of Jakifi. I have request that Ms Laura Wilcox call the office back and confirm dose. Will follow up with the patient tomorrow.

## 2018-07-28 NOTE — Telephone Encounter (Signed)
Ms [name] did return call and report the patient did not miss a dose of Jakafi.

## 2018-07-29 ENCOUNTER — Telehealth: Payer: Self-pay | Admitting: Pharmacy Technician

## 2018-07-29 ENCOUNTER — Telehealth: Payer: Self-pay

## 2018-07-29 MED FILL — JAKAFI 15 MG TABLET: 15 | 30 days supply | Qty: 60 | Fill #0

## 2018-07-29 NOTE — Telephone Encounter (Signed)
Spoke with Ms Laura Wilcox to inform her Per Dr Mike Gip she would like for [name] to stop the 10 mg of Jakafi BID and a new rx has beed sent in for Jafafi 15 mg 1 tab po BID. I have also informed her to call the office when she receive the new medication to inform her when to start. Keep prior medication dont throw them away for further uses. Ms Laura Wilcox was understanding and agreeable.

## 2018-07-29 NOTE — Telephone Encounter (Signed)
-----   Message from Lequita Asal, MD sent at 07/28/2018  1:25 PM EDT ----- Regarding: Please call patient's daughter  Any missed doses of Jakafi?  M ----- Message ----- From: Buel Ream, Lab In [place] Sent: 07/28/2018  11:20 AM EDT To: Lequita Asal, MD

## 2018-07-29 NOTE — Telephone Encounter (Signed)
Oral Oncology Patient Advocate Encounter   Was successful in renewing grant from Patient [facility]) for [phone] to provide copayment coverage for Jakafi.  This will keep the out of pocket expense at $0.    The billing information is as follows and has been shared with [place].   Member ID: [id number] Group ID: [id number] RxBin: [id number] Dates of Eligibility: 08/09/2018 through 08/08/2019  [place] [facility] [place] Phone [phone] Fax [phone] 07/29/2018 4:10 PM

## 2018-07-31 ENCOUNTER — Telehealth: Payer: Self-pay | Admitting: Hematology and Oncology

## 2018-07-31 ENCOUNTER — Telehealth: Payer: Self-pay | Admitting: *Deleted

## 2018-07-31 NOTE — Telephone Encounter (Signed)
Laura Wilcox daughter called and said she was told not to take her Cline Crock until the new dose arrived adn it came last night . She said she was told to call before starting it and she cannot get in touch with you because the office is closed. Is she OK to go ahead and start taking it?  Dr Mike Gip said she will call daughter

## 2018-07-31 NOTE — Telephone Encounter (Signed)
Re:  Jakafi arrival  Jakafi 15 mg BID arrived today.  Patient to begin taking.  Discussed putting aside her prior dosing (10 mg BID) in case we need to use it in the future.  She has follow-up counts scheduled in 2 weeks.   Lequita Asal, MD

## 2018-08-03 ENCOUNTER — Telehealth: Payer: Self-pay | Admitting: Hematology and Oncology

## 2018-08-03 NOTE — Telephone Encounter (Signed)
Re: Jakafi  Patient received new increased dose of Jakafi.  She started on Friday, 08/03/2018.   Lequita Asal, MD

## 2018-08-11 ENCOUNTER — Other Ambulatory Visit: Payer: Self-pay

## 2018-08-12 ENCOUNTER — Other Ambulatory Visit: Payer: Self-pay

## 2018-08-12 ENCOUNTER — Inpatient Hospital Stay: Payer: Medicare Other | Attending: Hematology and Oncology

## 2018-08-12 DIAGNOSIS — Z79899 Other long term (current) drug therapy: Secondary | ICD-10-CM | POA: Diagnosis not present

## 2018-08-12 DIAGNOSIS — Z87891 Personal history of nicotine dependence: Secondary | ICD-10-CM | POA: Diagnosis not present

## 2018-08-12 DIAGNOSIS — N289 Disorder of kidney and ureter, unspecified: Secondary | ICD-10-CM

## 2018-08-12 DIAGNOSIS — D45 Polycythemia vera: Secondary | ICD-10-CM | POA: Diagnosis not present

## 2018-08-12 DIAGNOSIS — D7581 Myelofibrosis: Secondary | ICD-10-CM | POA: Insufficient documentation

## 2018-08-12 LAB — CBC WITH DIFFERENTIAL/PLATELET
Abs Immature Granulocytes: 0.63 10*3/uL — ABNORMAL HIGH (ref 0.00–0.07)
Basophils Absolute: 0.2 10*3/uL — ABNORMAL HIGH (ref 0.0–0.1)
Basophils Relative: 1 %
Eosinophils Absolute: 0.6 10*3/uL — ABNORMAL HIGH (ref 0.0–0.5)
Eosinophils Relative: 5 %
HCT: 35.4 % — ABNORMAL LOW (ref 36.0–46.0)
Hemoglobin: 11.2 g/dL — ABNORMAL LOW (ref 12.0–15.0)
Immature Granulocytes: 6 %
Lymphocytes Relative: 20 %
Lymphs Abs: 2.3 10*3/uL (ref 0.7–4.0)
MCH: 28.3 pg (ref 26.0–34.0)
MCHC: 31.6 g/dL (ref 30.0–36.0)
MCV: 89.4 fL (ref 80.0–100.0)
Monocytes Absolute: 0.9 10*3/uL (ref 0.1–1.0)
Monocytes Relative: 8 %
Neutro Abs: 6.6 10*3/uL (ref 1.7–7.7)
Neutrophils Relative %: 60 %
Platelets: 436 10*3/uL — ABNORMAL HIGH (ref 150–400)
RBC: 3.96 MIL/uL (ref 3.87–5.11)
RDW: 22.9 % — ABNORMAL HIGH (ref 11.5–15.5)
WBC: 11.1 10*3/uL — ABNORMAL HIGH (ref 4.0–10.5)
nRBC: 0 % (ref 0.0–0.2)

## 2018-08-12 LAB — COMPREHENSIVE METABOLIC PANEL
ALT: 14 U/L (ref 0–44)
AST: 20 U/L (ref 15–41)
Albumin: 4.2 g/dL (ref 3.5–5.0)
Alkaline Phosphatase: 77 U/L (ref 38–126)
Anion gap: 8 (ref 5–15)
BUN: 25 mg/dL — ABNORMAL HIGH (ref 8–23)
CO2: 25 mmol/L (ref 22–32)
Calcium: 9 mg/dL (ref 8.9–10.3)
Chloride: 101 mmol/L (ref 98–111)
Creatinine, Ser: 1.21 mg/dL — ABNORMAL HIGH (ref 0.44–1.00)
GFR calc Af Amer: 46 mL/min — ABNORMAL LOW (ref >60)
GFR calc non Af Amer: 40 mL/min — ABNORMAL LOW (ref >60)
Glucose, Bld: 87 mg/dL (ref 70–99)
Potassium: 4.2 mmol/L (ref 3.5–5.1)
Sodium: 134 mmol/L — ABNORMAL LOW (ref 135–145)
Total Bilirubin: 0.6 mg/dL (ref 0.3–1.2)
Total Protein: 7 g/dL (ref 6.5–8.1)

## 2018-08-12 NOTE — Progress Notes (Signed)
[facility]  [address], [address] [place],  [address] Phone: [phone]  Fax: [phone]   Telemedicine Office Visit:  08/13/2018  Referring physician: No ref. provider found  I connected with Laura Wilcox on 08/13/2018 at 11:27 AM EDT by videoconferencing and verified that I was speaking with the correct person using 2 identifiers.  The patient was at home.  I discussed the limitations, risk, security and privacy concerns of performing an evaluation and management service by videoconferencing and  the availability of in person appointments.  I also discussed with the patient that there may be a patient responsible charge related to this service.  The patient expressed understanding and agreed to proceed.   Chief Complaint: Laura Wilcox is a 85 y.o. female with polycythemia rubra vera (PV) on Jakafi who is seen for 1 month assessment on Jakafi.   HPI: The patient was last seen in the hematology clinic on 07/14/2018. At that time, she was doing well. She voiced no complaints. Hemoglobin was 11.2. Platelet count was 507,000. WBC was 13,500.   On 07/29/2018 her dose of Jakafi was increased from 10mg  to 15mg  BID. She started the new dose on 08/03/2018.   During the interim, she has been tolerating the medicine very well.  She denies any symptoms. Her energy level is good. She denies any bruising, bleeding or recent infections. Her daughter is helping her drink more water.    Past Medical History:  Diagnosis Date  . Polycythemia     Past Surgical History:  Procedure Laterality Date  . ABDOMINAL HYSTERECTOMY     complete  . BREAST SURGERY      Family History  Problem Relation Age of Onset  . Heart disease Father   . Cancer Sister        breast  . Cancer Sister        breast  . Alzheimer's disease Sister     Social History:  reports that she quit smoking about 40 years ago. She has a 30.00 pack-year smoking history. She has never used  smokeless tobacco. She reports that she does not drink alcohol or use drugs. Former smoker (1/2 pack/day) from age 63s to 25s. She quit smoking in 1980.  She occasionally drinks a glass of wine.  She is widowed.  Patient is retired Sport and exercise psychologist. Patient moved to [facility] in 03/2017.  She lives independently in her own apartment in [place]. Her daughter lives next door.  Patient denies known exposures to radiation on toxins. The patient is accompanied by her daughter Manuela Schwartz today.  Participants in the patient's visit and their role in the encounter included the patient, her daughter Manuela Schwartz and Vito Berger, CMA, today.  The intake visit was provided by Vito Berger, CMA.   Allergies: No Known Allergies  Current Medications: Current Outpatient Medications  Medication Sig Dispense Refill  . calcitRIOL (ROCALTROL) 0.25 MCG capsule Take 0.25 mcg by mouth 3 (three) times a week.    . ruxolitinib phosphate (JAKAFI) 15 MG tablet Take 1 tablet (15 mg total) by mouth 2 (two) times daily for 30 days. 60 tablet 0   No current facility-administered medications for this visit.     Review of Systems  Constitutional: Negative.  Negative for chills, diaphoresis, fever, malaise/fatigue and weight loss.       Feels "good".  HENT: Positive for hearing loss. Negative for congestion, ear discharge, ear pain, nosebleeds, sinus pain and sore throat.   Eyes: Negative.  Negative for blurred vision, double  vision, photophobia and pain.  Respiratory: Negative.  Negative for cough, hemoptysis, sputum production and shortness of breath.   Cardiovascular: Negative.  Negative for chest pain, palpitations, orthopnea, leg swelling and PND.  Gastrointestinal: Negative.  Negative for abdominal pain, blood in stool, constipation, diarrhea, heartburn, melena, nausea and vomiting.  Genitourinary: Negative.  Negative for dysuria, frequency, hematuria and urgency.  Musculoskeletal: Negative.  Negative for back pain, falls, joint pain,  myalgias and neck pain.  Skin: Negative.  Negative for itching and rash.  Neurological: Negative.  Negative for dizziness, tingling, tremors, sensory change, speech change, focal weakness, weakness and headaches.  Endo/Heme/Allergies: Does not bruise/bleed easily.  Psychiatric/Behavioral: Negative.  Negative for depression and memory loss. The patient is not nervous/anxious and does not have insomnia.   All other systems reviewed and are negative.  Performance status (ECOG):  1-2  Physical Exam  Constitutional: She is oriented to person, place, and time. She appears well-developed and well-nourished. No distress.  Thin woman sitting comfortably in her home in no acute distress.   HENT:  Head: Normocephalic and atraumatic.  Short blonde hair.  Eyes: Conjunctivae and EOM are normal. No scleral icterus.  Blue eyes.  Neurological: She is alert and oriented to person, place, and time.  Skin: She is not diaphoretic.  Psychiatric: She has a normal mood and affect. Her behavior is normal. Judgment and thought content normal.  Nursing note reviewed.   Appointment on 08/12/2018  Component Date Value Ref Range Status  . WBC 08/12/2018 11.1* 4.0 - 10.5 K/uL Final  . RBC 08/12/2018 3.96  3.87 - 5.11 MIL/uL Final  . Hemoglobin 08/12/2018 11.2* 12.0 - 15.0 g/dL Final  . HCT 08/12/2018 35.4* 36.0 - 46.0 % Final  . MCV 08/12/2018 89.4  80.0 - 100.0 fL Final  . MCH 08/12/2018 28.3  26.0 - 34.0 pg Final  . MCHC 08/12/2018 31.6  30.0 - 36.0 g/dL Final  . RDW 08/12/2018 22.9* 11.5 - 15.5 % Final  . Platelets 08/12/2018 436* 150 - 400 K/uL Final  . nRBC 08/12/2018 0.0  0.0 - 0.2 % Final  . Neutrophils Relative % 08/12/2018 60  % Final  . Neutro Abs 08/12/2018 6.6  1.7 - 7.7 K/uL Final  . Lymphocytes Relative 08/12/2018 20  % Final  . Lymphs Abs 08/12/2018 2.3  0.7 - 4.0 K/uL Final  . Monocytes Relative 08/12/2018 8  % Final  . Monocytes Absolute 08/12/2018 0.9  0.1 - 1.0 K/uL Final  . Eosinophils  Relative 08/12/2018 5  % Final  . Eosinophils Absolute 08/12/2018 0.6* 0.0 - 0.5 K/uL Final  . Basophils Relative 08/12/2018 1  % Final  . Basophils Absolute 08/12/2018 0.2* 0.0 - 0.1 K/uL Final  . Immature Granulocytes 08/12/2018 6  % Final  . Abs Immature Granulocytes 08/12/2018 0.63* 0.00 - 0.07 K/uL Final   Performed at [facility], [address] [place], [place] [address]  . Sodium 08/12/2018 134* 135 - 145 mmol/L Final  . Potassium 08/12/2018 4.2  3.5 - 5.1 mmol/L Final  . Chloride 08/12/2018 101  98 - 111 mmol/L Final  . CO2 08/12/2018 25  22 - 32 mmol/L Final  . Glucose, Bld 08/12/2018 87  70 - 99 mg/dL Final  . BUN 08/12/2018 25* 8 - 23 mg/dL Final  . Creatinine, Ser 08/12/2018 1.21* 0.44 - 1.00 mg/dL Final  . Calcium 08/12/2018 9.0  8.9 - 10.3 mg/dL Final  . Total Protein 08/12/2018 7.0  6.5 - 8.1 g/dL Final  .  Albumin 08/12/2018 4.2  3.5 - 5.0 g/dL Final  . AST 08/12/2018 20  15 - 41 U/L Final  . ALT 08/12/2018 14  0 - 44 U/L Final  . Alkaline Phosphatase 08/12/2018 77  38 - 126 U/L Final  . Total Bilirubin 08/12/2018 0.6  0.3 - 1.2 mg/dL Final  . GFR calc non Af Amer 08/12/2018 40* >60 mL/min Final  . GFR calc Af Amer 08/12/2018 46* >60 mL/min Final  . Anion gap 08/12/2018 8  5 - 15 Final   Performed at [facility], [address], [place], [place] [address]    Assessment:  Laura Wilcox is a 85 y.o. female with polycythemia rubra vera and secondary myelofibrosis.  She presented in 04/2002 with thrombocytosis (835,000).  JAK2 V617F was positive on 05/20/2012.  Over the past year, hematocrit has ranged 37.5 - 42.4, hemoglobin 12.1 - 13.4, platelets 458,000 - 569,000, WBC 10,500 - 15,700.  Creatinine has been 1.0 - 1.2.  Bone marrow  on 04/15/2002 revealed a normocellular marrow for age with trilineage hematopoiesis and mild megakaryocytic hyperplasia with focal atypia.  There was no morphologic evidence of a myeloproliferative disorder.   Iron stains were inadequate for evaluation of storage iron.  There was no increase in reticulin fibers.  She has been on agrylin since 2012.  Bone marrow on 09/25/2012 revealed a persistent myeloproliferative disorder s/p Agrylin.  The current features were best classified as persistent myeloproliferative neoplasm (MPN) JAK2 V617F mutation positive.  Marrow was hypercellular for age (30-50%) with increased trilineage hematopoiesis and markedly increased atypical megakaryopoiesis with frequent clustering.  There were no increased blasts.  Iron stores were absent.  There was moderate to severe myelofibrosis (grade MF 2-3).  Differential included ET, PV, primary myelofibrosis, and MPN, unclassifiable.  Cytogenetics were normal (32, XX).  She developed pancytopenia in 12/2015.  Etiology is unclear (medication confusion or hydroxyurea + Agrylin).  She received Procrit 40,000 units (intermittently 09/2012 - 06/2016), Venofer (09/2012 - 10/2012), and Infed (12/2015 - 03/2016).  She received Neupogen during a period of neutropenia. She was anagrelide from 2004 - 08/13/2017.  She began [place] on 08/14/2017.  Jakafi was increased from 5 mg BID to 10 mg BID on 06/17/2018.  Jakafi was increased to 15 mg BID on 08/03/2018.  She began a phlebotomy program on 08/06/2017.  She undergoes small volume (150 cc with replacement of 150 cc NS) if her HCT > 42.  Ferritin has been followed: 529 on 09/03/2017, 1020 on 12/03/2017, and 423 on 02/18/2018.  Symptomatically, she is doing well.  She denies any concerns.  Plan: 1. Review labs from 08/12/2018. 2. Polycythemia rubra vera Clinically she continues to do well. Hemoglobin 11.2, hematocrit 35.4. Hematocrit goal < 42 No phlebotomy needed Platelet count is 436,000. Platelet count coming down nicely on Jakafai 15 mg BID. 3. Renal insufficiency BUN 25 and creatinine 1.21. Continue to encourage fluids. Patient followed by Dr. Holley Raring.  4. Hyperparathyroidism  Calcium 9.0. Patient  remains on calcitriol 0.25 mg on Monday, Wednesday, and Friday. Patient followed by Dr. Holley Raring. 5.   Anticipate decreasing frequency of labs once count stability documented given patient's past history. 6.   RTC in 2 weeks for labs (CBC with diff). 7.   RTC in 4 weeks for MD assessment (Doximity) and labs (CBC with diff, CMP).   I discussed the assessment and treatment plan with the patient.  The patient was provided an opportunity to ask questions and all were answered.  The patient  agreed with the plan and demonstrated an understanding of the instructions.  The patient was advised to call back if the symptoms worsen or if the condition fails to improve as anticipated.   Lequita Asal, MD, PhD    08/13/2018, 11:27 AM  I, Molly Dorshimer, am acting as Education administrator for [employer]. Mike Gip, MD, PhD.  I, Melissa C. Mike Gip, MD, have reviewed the above documentation for accuracy and completeness, and I agree with the above.

## 2018-08-13 ENCOUNTER — Encounter: Payer: Self-pay | Admitting: Hematology and Oncology

## 2018-08-13 ENCOUNTER — Inpatient Hospital Stay (HOSPITAL_BASED_OUTPATIENT_CLINIC_OR_DEPARTMENT_OTHER): Payer: Medicare Other | Admitting: Hematology and Oncology

## 2018-08-13 ENCOUNTER — Other Ambulatory Visit: Payer: Medicare Other

## 2018-08-13 DIAGNOSIS — N289 Disorder of kidney and ureter, unspecified: Secondary | ICD-10-CM

## 2018-08-13 DIAGNOSIS — D45 Polycythemia vera: Secondary | ICD-10-CM

## 2018-08-13 NOTE — Progress Notes (Signed)
No new changes noted today. Ms Laura Wilcox was informed that her mother platelets has improved. The patient Name, DOB and address has been verified by phone today.

## 2018-08-25 ENCOUNTER — Other Ambulatory Visit: Payer: Self-pay | Admitting: Hematology and Oncology

## 2018-08-25 DIAGNOSIS — D45 Polycythemia vera: Secondary | ICD-10-CM

## 2018-08-25 NOTE — Telephone Encounter (Signed)
Comprehensive metabolic panel  Order: [id number]  Status:  Final result  Visible to patient:  Yes (MyChart)  Next appt:  08/27/2018 at 11:00 AM in Oncology ([facility])  Dx:  Polycythemia rubra vera ([place]); Renal ...   Ref Range & Units 13d ago (08/12/18) 4wk ago (07/28/18) 21mo ago (07/13/18) 29mo ago (06/16/18) 27mo ago (05/06/18) 25mo ago (03/18/18) 90mo ago (03/11/18)  Sodium 135 - 145 mmol/L 134Low   134Low   135  135  136  136  137   Potassium 3.5 - 5.1 mmol/L 4.2  4.3  4.1  4.3  4.4  4.2  4.8   Chloride 98 - 111 mmol/L 101  99  102  100  101  100  98   CO2 22 - 32 mmol/L 25  26  26  26  24  27  29    Glucose, Bld 70 - 99 mg/dL 87  117High   94  86  85  99  89   BUN 8 - 23 mg/dL 25High   25High   29High   29High   23  27High   22   Creatinine, Ser 0.44 - 1.00 mg/dL 1.21High   1.34High   1.16High   1.19High   1.08High   1.11High   1.02High    Calcium 8.9 - 10.3 mg/dL 9.0  9.2  9.0  9.1  9.2  9.2  9.4   Total Protein 6.5 - 8.1 g/dL 7.0   7.1  7.5  7.3  7.7  7.5   Albumin 3.5 - 5.0 g/dL 4.2   4.2  4.2  4.1  4.3  4.3   AST 15 - 41 U/L 20   20  21  18  20  22    ALT 0 - 44 U/L 14   14  12  12  13  13    Alkaline Phosphatase 38 - 126 U/L 77   86  97  88  86  95   Total Bilirubin 0.3 - 1.2 mg/dL 0.6   0.6  0.3  0.6  0.5  0.6   GFR calc non Af Amer >60 mL/min 40Low   35Low   42Low   40Low   45Low   44Low   49Low    GFR calc Af Amer >60 mL/min 46Low   41Low   48Low   47Low   53Low   51Low   56Low    Anion gap 5 - 15 8  9  CM 7 CM 9 CM 11 CM 9 CM 10 CM  Comment: Performed at [facility], [address] [place], [place] [address]  Resulting Agency  [facility] [place] [facility] [place] [facility] [place] [facility] [place] [facility] [place] [facility] [place] [facility]      Specimen Collected: 08/12/18 11:22  Last Resulted: 08/12/18 11:41     Lab Flowsheet    Order Details    View Encounter    Lab and Collection Details    Routing    Result History      CM=Additional comments      Other Results from  08/12/2018   Contains abnormal data CBC with Differential/Platelet  Order: [id number]   Status:  Final result  Visible to patient:  Yes (MyChart)  Next appt:  08/27/2018 at 11:00 AM in Oncology ([facility])  Dx:  Polycythemia rubra vera ([place]); Renal ...   Ref Range & Units 13d ago (08/12/18) 4wk ago (07/28/18) 31mo ago (07/13/18)  15mo ago (06/30/18) 63mo ago (06/16/18) 22mo ago (05/27/18) 52mo ago (05/06/18)  WBC 4.0 - 10.5 K/uL 11.1High   13.1High   13.5High   14.3High   14.6High   14.0High   14.4High    RBC 3.87 - 5.11 MIL/uL 3.96  4.08  4.08  4.03  4.35  4.42  4.68   Hemoglobin 12.0 - 15.0 g/dL 11.2Low   11.1Low   11.2Low   11.3Low   11.7Low   12.1  12.9   HCT 36.0 - 46.0 % 35.4Low   36.5  36.1  35.8Low   38.5  38.5  41.5   MCV 80.0 - 100.0 fL 89.4  89.5  88.5  88.8  88.5  87.1  88.7   MCH 26.0 - 34.0 pg 28.3  27.2  27.5  28.0  26.9  27.4  27.6   MCHC 30.0 - 36.0 g/dL 31.6  30.4  31.0  31.6  30.4  31.4  31.1   RDW 11.5 - 15.5 % 22.9High   23.3High   23.0High   23.1High   23.3High   22.1High   21.8High    Platelets 150 - 400 K/uL 436High   584High   507High   675High   794High   641High   574High    nRBC 0.0 - 0.2 % 0.0  0.2  0.0  0.0  0.0  0.0  0.1   Neutrophils Relative % % 60  58  65  60  61  63  66   Neutro Abs 1.7 - 7.7 K/uL 6.6  7.7  8.9High   8.5High   9.8High   8.8High   9.6High    Lymphocytes Relative % 20  20  15  18  17  16  14    Lymphs Abs 0.7 - 4.0 K/uL 2.3  2.6  2.0  2.6  2.5  2.2  2.0   Monocytes Relative % 8  9  8  8  9  8  7    Monocytes Absolute 0.1 - 1.0 K/uL 0.9  1.1High   1.1High   1.2High   1.3High   1.1High   1.0   Eosinophils Relative % 5  5  5  6  2  6  6    Eosinophils Absolute 0.0 - 0.5 K/uL 0.6High   0.7High   0.6High   0.9High   0.3  0.9High   0.8High    Basophils Relative % 1  2  2  2  1  1  2    Basophils Absolute 0.0 - 0.1 K/uL 0.2High   0.3High   0.3High   0.3High   0.1  0.2High   0.2High    Immature Granulocytes % 6  6  5  6   6  5    Abs Immature Granulocytes  0.00 - 0.07 K/uL 0.63High   0.84High  CM 0.65High  CM 0.85High  CM 0.60High  CM 0.80High  CM 0.67High  CM  Comment: Performed at [facility], [address] [place], [place] [address]  Band Neutrophils      6 R    Smear Review      PLATELETS APPEAR INCREASED     Metamyelocytes Relative      3 R    Myelocytes      1 R    Resulting Agency  [place] [facility] [place] [facility] [place] [facility] [place] [facility] [place] [facility] [place] [facility] [place] [facility]      Specimen  Collected: 08/12/18 11:22  Last Resulted: 08/12/18 12:50

## 2018-08-27 ENCOUNTER — Other Ambulatory Visit: Payer: Self-pay

## 2018-08-27 ENCOUNTER — Inpatient Hospital Stay: Payer: Medicare Other

## 2018-08-27 DIAGNOSIS — D45 Polycythemia vera: Secondary | ICD-10-CM

## 2018-08-27 DIAGNOSIS — D7581 Myelofibrosis: Secondary | ICD-10-CM | POA: Diagnosis not present

## 2018-08-27 DIAGNOSIS — N289 Disorder of kidney and ureter, unspecified: Secondary | ICD-10-CM

## 2018-08-27 DIAGNOSIS — Z79899 Other long term (current) drug therapy: Secondary | ICD-10-CM | POA: Diagnosis not present

## 2018-08-27 DIAGNOSIS — Z87891 Personal history of nicotine dependence: Secondary | ICD-10-CM | POA: Diagnosis not present

## 2018-08-27 LAB — CBC WITH DIFFERENTIAL/PLATELET
Abs Immature Granulocytes: 0.58 10*3/uL — ABNORMAL HIGH (ref 0.00–0.07)
Basophils Absolute: 0.2 10*3/uL — ABNORMAL HIGH (ref 0.0–0.1)
Basophils Relative: 2 %
Eosinophils Absolute: 0.6 10*3/uL — ABNORMAL HIGH (ref 0.0–0.5)
Eosinophils Relative: 5 %
HCT: 33.6 % — ABNORMAL LOW (ref 36.0–46.0)
Hemoglobin: 10.5 g/dL — ABNORMAL LOW (ref 12.0–15.0)
Immature Granulocytes: 5 %
Lymphocytes Relative: 18 %
Lymphs Abs: 2.2 10*3/uL (ref 0.7–4.0)
MCH: 27.9 pg (ref 26.0–34.0)
MCHC: 31.3 g/dL (ref 30.0–36.0)
MCV: 89.4 fL (ref 80.0–100.0)
Monocytes Absolute: 1.1 10*3/uL — ABNORMAL HIGH (ref 0.1–1.0)
Monocytes Relative: 9 %
Neutro Abs: 7.6 10*3/uL (ref 1.7–7.7)
Neutrophils Relative %: 61 %
Platelets: 401 10*3/uL — ABNORMAL HIGH (ref 150–400)
RBC: 3.76 MIL/uL — ABNORMAL LOW (ref 3.87–5.11)
RDW: 22.9 % — ABNORMAL HIGH (ref 11.5–15.5)
WBC: 12.2 10*3/uL — ABNORMAL HIGH (ref 4.0–10.5)
nRBC: 0.2 % (ref 0.0–0.2)

## 2018-08-27 MED FILL — JAKAFI 15 MG TABLET: 15 | 30 days supply | Qty: 60 | Fill #0

## 2018-09-10 ENCOUNTER — Inpatient Hospital Stay: Payer: Medicare Other | Attending: Hematology and Oncology

## 2018-09-10 ENCOUNTER — Other Ambulatory Visit: Payer: Self-pay

## 2018-09-10 DIAGNOSIS — D45 Polycythemia vera: Secondary | ICD-10-CM | POA: Insufficient documentation

## 2018-09-10 DIAGNOSIS — N289 Disorder of kidney and ureter, unspecified: Secondary | ICD-10-CM

## 2018-09-10 LAB — CBC WITH DIFFERENTIAL/PLATELET
Abs Immature Granulocytes: 0.7 10*3/uL — ABNORMAL HIGH (ref 0.00–0.07)
Basophils Absolute: 0.1 10*3/uL (ref 0.0–0.1)
Basophils Relative: 1 %
Eosinophils Absolute: 0.4 10*3/uL (ref 0.0–0.5)
Eosinophils Relative: 4 %
HCT: 32.6 % — ABNORMAL LOW (ref 36.0–46.0)
Hemoglobin: 10.3 g/dL — ABNORMAL LOW (ref 12.0–15.0)
Immature Granulocytes: 7 %
Lymphocytes Relative: 22 %
Lymphs Abs: 2.2 10*3/uL (ref 0.7–4.0)
MCH: 28.5 pg (ref 26.0–34.0)
MCHC: 31.6 g/dL (ref 30.0–36.0)
MCV: 90.1 fL (ref 80.0–100.0)
Monocytes Absolute: 0.9 10*3/uL (ref 0.1–1.0)
Monocytes Relative: 9 %
Neutro Abs: 5.6 10*3/uL (ref 1.7–7.7)
Neutrophils Relative %: 57 %
Platelets: 383 10*3/uL (ref 150–400)
RBC: 3.62 MIL/uL — ABNORMAL LOW (ref 3.87–5.11)
RDW: 22.8 % — ABNORMAL HIGH (ref 11.5–15.5)
WBC: 10 10*3/uL (ref 4.0–10.5)
nRBC: 0.3 % — ABNORMAL HIGH (ref 0.0–0.2)

## 2018-09-10 LAB — COMPREHENSIVE METABOLIC PANEL
ALT: 14 U/L (ref 0–44)
AST: 20 U/L (ref 15–41)
Albumin: 4.1 g/dL (ref 3.5–5.0)
Alkaline Phosphatase: 74 U/L (ref 38–126)
Anion gap: 9 (ref 5–15)
BUN: 27 mg/dL — ABNORMAL HIGH (ref 8–23)
CO2: 25 mmol/L (ref 22–32)
Calcium: 9.1 mg/dL (ref 8.9–10.3)
Chloride: 100 mmol/L (ref 98–111)
Creatinine, Ser: 1.29 mg/dL — ABNORMAL HIGH (ref 0.44–1.00)
GFR calc Af Amer: 43 mL/min — ABNORMAL LOW (ref >60)
GFR calc non Af Amer: 37 mL/min — ABNORMAL LOW (ref >60)
Glucose, Bld: 89 mg/dL (ref 70–99)
Potassium: 4.3 mmol/L (ref 3.5–5.1)
Sodium: 134 mmol/L — ABNORMAL LOW (ref 135–145)
Total Bilirubin: 0.6 mg/dL (ref 0.3–1.2)
Total Protein: 7.2 g/dL (ref 6.5–8.1)

## 2018-09-11 ENCOUNTER — Other Ambulatory Visit: Payer: Self-pay

## 2018-09-11 ENCOUNTER — Other Ambulatory Visit: Payer: Medicare Other

## 2018-09-11 ENCOUNTER — Ambulatory Visit: Payer: Medicare Other | Admitting: Hematology and Oncology

## 2018-09-11 ENCOUNTER — Telehealth: Payer: Self-pay

## 2018-09-11 NOTE — Telephone Encounter (Signed)
-----   Message from Lequita Asal, MD sent at 09/10/2018  5:33 PM EDT ----- Regarding: Please call daughter with platelet count  Platelet count looks good.  M ----- Message ----- From: Interface, Lab In [place] Sent: 09/10/2018  11:12 AM EDT To: Lequita Asal, MD

## 2018-09-11 NOTE — Telephone Encounter (Signed)
Spoke with Laura Wilcox to inform her of her mothers lab results. Platelets was good. Laura Wilcox was understanding and agreeable.

## 2018-09-13 NOTE — Progress Notes (Signed)
[facility]  [address] [place], [place] [address] Phone: [phone]  Fax: [phone]   Telemedicine Office Visit:  09/14/2018  Referring physician: No ref. provider found  I connected with Laura Wilcox on 09/14/2018 at 9:57 AM by videoconferencing and verified that I was speaking with the correct person using 2 identifiers.  The patient was at her aprtment.  I discussed the limitations, risk, security and privacy concerns of performing an evaluation and management service by videoconferencing and the availability of in person appointments.  I also discussed with the patient that there may be a patient responsible charge related to this service.  The patient expressed understanding and agreed to proceed.   Chief Complaint: Laura Wilcox is a 85 y.o. female with polycythemia rubra vera (PV) on Jakafi who is seen for1 monthassessment on Jakafi.   HPI: The patient was last seen in the hematology clinic on 08/13/2018. At that time, she was doing well. She denied any concerns.   CBC revealed a hematocrit of 35.4, hemoglobin 11.2, platelets 436,000, WBC 11,100 with an ANC of 6600.  She continued Jakafi 15 mg BID.  Labs followed: 08/27/2018: WBC 12,200 (ANC 7,600), hemoglobin 10.5, hematocrit 33.6, platelets 401,000. 09/10/2018: WBC 10,000 (ANC 5,600), hemoglobin 10.3, hematocrit 32.6, platelets 383,000. Sodium 134, creatinine 1.29, BUN 27.   During the interim, she has done well.  She has not many missed any doses of Jakafi.  Her daughter calls twice a day to ensure that she takes her medications.  She has been drinking her fluids.  She is staying safe during the COVID-19 pandemic and not getting out of the house except to go to physician visits.  She is getting some exercise by walking around the apartment complex.  She remains slightly fatigue and takes naps during the day.   Past Medical History:  Diagnosis Date  . Polycythemia     Past  Surgical History:  Procedure Laterality Date  . ABDOMINAL HYSTERECTOMY     complete  . BREAST SURGERY      Family History  Problem Relation Age of Onset  . Heart disease Father   . Cancer Sister        breast  . Cancer Sister        breast  . Alzheimer's disease Sister     Social History:  reports that she quit smoking about 40 years ago. She has a 30.00 pack-year smoking history. She has never used smokeless tobacco. She reports that she does not drink alcohol or use drugs. Former smoker (1/2 pack/day) from age 48s to 42s. She quit smoking in 1980. She occasionally drinks a glass of wine. She is widowed. Patient is retired Sport and exercise psychologist. Patient moved to [facility] in 03/2017. She lives independently in her own apartment in [place]. Her daughter lives next door. Patient denies known exposures to radiation on toxins. The patient is accompanied by her daughter Manuela Schwartz today.  Participants in the patient's visit and their role in the encounter included the patient and Waymon Budge, RN, today.  The intake visit was provided by Waymon Budge, RN.  Allergies: No Known Allergies  Current Medications: Current Outpatient Medications  Medication Sig Dispense Refill  . calcitRIOL (ROCALTROL) 0.25 MCG capsule Take 0.25 mcg by mouth 3 (three) times a week.    Marland Kitchen JAKAFI 15 MG tablet TAKE 1 TABLET (15 MG TOTAL) BY MOUTH 2 TIMES DAILY FOR 30 DAYS. 60 tablet 0   No current facility-administered medications for this visit.  Review of Systems  Constitutional: Positive for malaise/fatigue (some). Negative for chills, diaphoresis, fever and weight loss.       Feels "pretty good". Takes naps.  Active.  HENT: Positive for hearing loss. Negative for congestion, ear discharge, nosebleeds, sinus pain and sore throat.   Eyes: Negative.  Negative for blurred vision, double vision, photophobia and pain.  Respiratory: Negative.  Negative for cough, hemoptysis, sputum production and shortness of breath.    Cardiovascular: Negative.  Negative for chest pain, palpitations, orthopnea, leg swelling and PND.  Gastrointestinal: Negative.  Negative for abdominal pain, blood in stool, constipation, diarrhea, heartburn, melena, nausea and vomiting.  Genitourinary: Negative.  Negative for dysuria, frequency and hematuria.  Musculoskeletal: Negative.  Negative for back pain, falls, joint pain, myalgias and neck pain.  Skin: Negative.  Negative for itching and rash.  Neurological: Negative.  Negative for dizziness, tingling, tremors, sensory change, speech change, focal weakness, weakness and headaches.  Endo/Heme/Allergies: Negative.  Does not bruise/bleed easily.  Psychiatric/Behavioral: Negative.  Negative for depression and memory loss. The patient is not nervous/anxious and does not have insomnia.   All other systems reviewed and are negative.   Performance status (ECOG): 1-2  Physical Exam  Constitutional: She is oriented to person, place, and time. She appears well-developed and well-nourished. No distress.  Thin woman sitting comfortably at home in no acute distress.   HENT:  Head: Normocephalic and atraumatic.  Short blonde hair.  Eyes: Conjunctivae and EOM are normal. No scleral icterus.  Blue eyes.  Neurological: She is alert and oriented to person, place, and time.  Skin: She is not diaphoretic.  Psychiatric: She has a normal mood and affect. Her behavior is normal. Judgment and thought content normal.  Nursing note reviewed.   No visits with results within 3 Day(s) from this visit.  Latest known visit with results is:  Appointment on 09/10/2018  Component Date Value Ref Range Status  . WBC 09/10/2018 10.0  4.0 - 10.5 K/uL Final  . RBC 09/10/2018 3.62* 3.87 - 5.11 MIL/uL Final  . Hemoglobin 09/10/2018 10.3* 12.0 - 15.0 g/dL Final  . HCT 09/10/2018 32.6* 36.0 - 46.0 % Final  . MCV 09/10/2018 90.1  80.0 - 100.0 fL Final  . MCH 09/10/2018 28.5  26.0 - 34.0 pg Final  . MCHC 09/10/2018  31.6  30.0 - 36.0 g/dL Final  . RDW 09/10/2018 22.8* 11.5 - 15.5 % Final  . Platelets 09/10/2018 383  150 - 400 K/uL Final  . nRBC 09/10/2018 0.3* 0.0 - 0.2 % Final  . Neutrophils Relative % 09/10/2018 57  % Final  . Neutro Abs 09/10/2018 5.6  1.7 - 7.7 K/uL Final  . Lymphocytes Relative 09/10/2018 22  % Final  . Lymphs Abs 09/10/2018 2.2  0.7 - 4.0 K/uL Final  . Monocytes Relative 09/10/2018 9  % Final  . Monocytes Absolute 09/10/2018 0.9  0.1 - 1.0 K/uL Final  . Eosinophils Relative 09/10/2018 4  % Final  . Eosinophils Absolute 09/10/2018 0.4  0.0 - 0.5 K/uL Final  . Basophils Relative 09/10/2018 1  % Final  . Basophils Absolute 09/10/2018 0.1  0.0 - 0.1 K/uL Final  . Immature Granulocytes 09/10/2018 7  % Final  . Abs Immature Granulocytes 09/10/2018 0.70* 0.00 - 0.07 K/uL Final   Performed at [facility], [address] [place], [place] [address]  . Sodium 09/10/2018 134* 135 - 145 mmol/L Final  . Potassium 09/10/2018 4.3  3.5 - 5.1 mmol/L Final  .  Chloride 09/10/2018 100  98 - 111 mmol/L Final  . CO2 09/10/2018 25  22 - 32 mmol/L Final  . Glucose, Bld 09/10/2018 89  70 - 99 mg/dL Final  . BUN 09/10/2018 27* 8 - 23 mg/dL Final  . Creatinine, Ser 09/10/2018 1.29* 0.44 - 1.00 mg/dL Final  . Calcium 09/10/2018 9.1  8.9 - 10.3 mg/dL Final  . Total Protein 09/10/2018 7.2  6.5 - 8.1 g/dL Final  . Albumin 09/10/2018 4.1  3.5 - 5.0 g/dL Final  . AST 09/10/2018 20  15 - 41 U/L Final  . ALT 09/10/2018 14  0 - 44 U/L Final  . Alkaline Phosphatase 09/10/2018 74  38 - 126 U/L Final  . Total Bilirubin 09/10/2018 0.6  0.3 - 1.2 mg/dL Final  . GFR calc non Af Amer 09/10/2018 37* >60 mL/min Final  . GFR calc Af Amer 09/10/2018 43* >60 mL/min Final  . Anion gap 09/10/2018 9  5 - 15 Final   Performed at [facility], [address] [place], [place] [address]    Assessment:  Laura Wilcox is a 85 y.o. female with polycythemia rubra veraand secondary myelofibrosis.  She presented in 04/2002 with thrombocytosis (835,000). JAK2 V617Fwas positive on 05/20/2012.  Over the past year,hematocrithas ranged 37.5 - 42.4,hemoglobin12.1 - 13.4, platelets458,000 - 569,000, WBC10,500 - 15,700. Creatinine has been 1.0 - 1.2.  Bone marrowon 04/15/2002 revealed a normocellular marrow for age with trilineage hematopoiesis and mild megakaryocytic hyperplasia with focal atypia. There was no morphologic evidence of a myeloproliferative disorder. Iron stains were inadequate for evaluation of storage iron. There was no increase in reticulin fibers. She has been on agrylin since 2012.  Bone marrowon 09/25/2012 revealed a persistent myeloproliferative disorder s/p Agrylin. The current features were best classified as persistent myeloproliferative neoplasm (MPN) JAK2 V617F mutation positive. Marrow was hypercellular for age (30-50%) with increased trilineage hematopoiesis and markedly increased atypical megakaryopoiesis with frequent clustering. There were no increased blasts. Iron stores were absent. There was moderate to severe myelofibrosis(grade MF 2-3). Differential included ET, PV, primary myelofibrosis, and MPN, unclassifiable. Cytogeneticswere normal (38, XX).  She developed pancytopeniain 12/2015. Etiology is unclear (medication confusion or hydroxyurea + Agrylin).  She received Procrit40,000 units (intermittently 09/2012 - 06/2016), Venofer(09/2012 - 10/2012), and Infed(12/2015 - 03/2016). She received Neupogenduring a period of neutropenia. She was anagrelidefrom 2004 - 08/13/2017. She beganJakafion 08/14/2017. Jakafi was increased from 5 mg BID to 10 mg BID on03/18/2020.  Jakafi was increased to 15 mg BID on 08/03/2018.  She began a phlebotomy programon 08/06/2017. She undergoes small volume (150 cc with replacement of 150 cc NS)if her HCT >42.  Ferritinhas been followed: 529 on 09/03/2017, 1020 on 12/03/2017, and 423 on  02/18/2018.  Symptomatically, she is doing well.  She voices no concerns.  Plan: 1.   Review labs from 08/27/2018 and 09/10/2018. 2.   Polycythemia rubra vera Clinically, she continues to do well.  Hemoglobin10.3, hematocrit32.6. Hematocrit goal < 42 No phlebotomy is needed. Platelet countis 383,000. Continue Jakafi 15 mg p.o. twice daily. 3.   Renal insufficiency BUN27and creatinine 1.29. Encourage hydration. Patient is followed by nephrology, Dr. Holley Raring.  4.   Hyperparathyroidism Calcium 9.1. Patient on calcitriol 0.25 mg 3 times a week. 5.RTC in 6 weeks for labs (CBC with diff, BMP). 6.   RTC in 12 weeks for MD assessment and labs (CBC with diff, CMP).  I discussed the assessment and treatment plan with the patient.  The patient was provided an opportunity to  ask questions and all were answered.  The patient agreed with the plan and demonstrated an understanding of the instructions.  The patient was advised to call back or seek an in person evaluation if the symptoms worsen or if the condition fails to improve as anticipated.  I provided 13 minutes (9:57 AM - 10:09 AM) of face-to-face video visit time during this this encounter and > 50% was spent counseling as documented under my assessment and plan.  I provided these services from the [facility] office.   Nolon Stalls, MD, PhD  09/14/2018, 9:57 AM

## 2018-09-14 ENCOUNTER — Inpatient Hospital Stay (HOSPITAL_BASED_OUTPATIENT_CLINIC_OR_DEPARTMENT_OTHER): Payer: Medicare Other | Admitting: Hematology and Oncology

## 2018-09-14 ENCOUNTER — Encounter: Payer: Self-pay | Admitting: Hematology and Oncology

## 2018-09-14 ENCOUNTER — Inpatient Hospital Stay: Payer: Medicare Other

## 2018-09-14 DIAGNOSIS — N289 Disorder of kidney and ureter, unspecified: Secondary | ICD-10-CM

## 2018-09-14 DIAGNOSIS — D45 Polycythemia vera: Secondary | ICD-10-CM | POA: Diagnosis not present

## 2018-09-14 DIAGNOSIS — Z79899 Other long term (current) drug therapy: Secondary | ICD-10-CM

## 2018-09-14 DIAGNOSIS — E213 Hyperparathyroidism, unspecified: Secondary | ICD-10-CM

## 2018-09-14 NOTE — Progress Notes (Signed)
Confirmed Name, DOB, and Address. Denies any concerns.  

## 2018-09-17 ENCOUNTER — Other Ambulatory Visit: Payer: Self-pay | Admitting: Hematology and Oncology

## 2018-09-17 DIAGNOSIS — D45 Polycythemia vera: Secondary | ICD-10-CM

## 2018-09-30 MED FILL — JAKAFI 15 MG TABLET: 15 | 30 days supply | Qty: 60 | Fill #0

## 2018-10-07 ENCOUNTER — Other Ambulatory Visit: Payer: Medicare Other

## 2018-10-08 ENCOUNTER — Ambulatory Visit: Payer: Medicare Other | Admitting: Hematology and Oncology

## 2018-10-26 ENCOUNTER — Inpatient Hospital Stay: Payer: Medicare Other | Attending: Hematology and Oncology

## 2018-10-26 ENCOUNTER — Other Ambulatory Visit: Payer: Self-pay

## 2018-10-26 ENCOUNTER — Telehealth: Payer: Self-pay | Admitting: Hematology and Oncology

## 2018-10-26 ENCOUNTER — Telehealth: Payer: Self-pay

## 2018-10-26 DIAGNOSIS — D45 Polycythemia vera: Secondary | ICD-10-CM | POA: Insufficient documentation

## 2018-10-26 DIAGNOSIS — N289 Disorder of kidney and ureter, unspecified: Secondary | ICD-10-CM

## 2018-10-26 LAB — CBC WITH DIFFERENTIAL/PLATELET
Abs Immature Granulocytes: 0.46 10*3/uL — ABNORMAL HIGH (ref 0.00–0.07)
Basophils Absolute: 0.2 10*3/uL — ABNORMAL HIGH (ref 0.0–0.1)
Basophils Relative: 2 %
Eosinophils Absolute: 0.5 10*3/uL (ref 0.0–0.5)
Eosinophils Relative: 4 %
HCT: 30.6 % — ABNORMAL LOW (ref 36.0–46.0)
Hemoglobin: 9.6 g/dL — ABNORMAL LOW (ref 12.0–15.0)
Immature Granulocytes: 4 %
Lymphocytes Relative: 16 %
Lymphs Abs: 1.7 10*3/uL (ref 0.7–4.0)
MCH: 29.3 pg (ref 26.0–34.0)
MCHC: 31.4 g/dL (ref 30.0–36.0)
MCV: 93.3 fL (ref 80.0–100.0)
Monocytes Absolute: 1 10*3/uL (ref 0.1–1.0)
Monocytes Relative: 10 %
Neutro Abs: 7 10*3/uL (ref 1.7–7.7)
Neutrophils Relative %: 64 %
Platelets: 424 10*3/uL — ABNORMAL HIGH (ref 150–400)
RBC: 3.28 MIL/uL — ABNORMAL LOW (ref 3.87–5.11)
RDW: 23 % — ABNORMAL HIGH (ref 11.5–15.5)
WBC: 10.9 10*3/uL — ABNORMAL HIGH (ref 4.0–10.5)
nRBC: 0.2 % (ref 0.0–0.2)

## 2018-10-26 NOTE — Telephone Encounter (Signed)
Spoke with daughter, Laura Wilcox, and informed her of plt count. Daughter reports patient has been missing some doses of her Jakafi. This includes last night and three other times in the past week. Daughter states she is trying to work on a system for the patient to help her remember to take it, including pill boxes and reminder calls. Informed daughter we would like to recheck labs in 2-3 weeks. Daughter confirmed understanding and denies any further questions or concerns.

## 2018-10-26 NOTE — Telephone Encounter (Signed)
-----   Message from Lequita Asal, MD sent at 10/26/2018 11:20 AM EDT ----- Regarding: Please call patient and add lab  Platelet count is a little up.  Did she miss any of her Jakafi?  Recheck CBC with diff in 2-3 weeks.  She is more anemic.  Add labs:  ferritin, iron studies, B12, folate, retic.  M ----- Message ----- From: Buel Ream, Lab In [place] Sent: 10/26/2018  11:07 AM EDT To: Lequita Asal, MD

## 2018-10-29 ENCOUNTER — Other Ambulatory Visit: Payer: Self-pay | Admitting: Hematology and Oncology

## 2018-10-29 DIAGNOSIS — D45 Polycythemia vera: Secondary | ICD-10-CM

## 2018-10-29 NOTE — Telephone Encounter (Signed)
CBC with Differential/Platelet Order: [id number] Status:  Final result Visible to patient:  No (not released) Next appt:  11/17/2018 at 11:00 AM in Oncology ([facility]) Dx:  Polycythemia rubra vera ([place])  Ref Range & Units 3d ago 56mo ago 17mo ago  WBC 4.0 - 10.5 K/uL 10.9High   10.0  12.2High    RBC 3.87 - 5.11 MIL/uL 3.28Low   3.62Low   3.76Low    Hemoglobin 12.0 - 15.0 g/dL 9.6Low   10.3Low   10.5Low    HCT 36.0 - 46.0 % 30.6Low   32.6Low   33.6Low    MCV 80.0 - 100.0 fL 93.3  90.1  89.4   MCH 26.0 - 34.0 pg 29.3  28.5  27.9   MCHC 30.0 - 36.0 g/dL 31.4  31.6  31.3   RDW 11.5 - 15.5 % 23.0High   22.8High   22.9High    Platelets 150 - 400 K/uL 424High   383  401High    nRBC 0.0 - 0.2 % 0.2  0.3High   0.2   Neutrophils Relative % % 64  57  61   Neutro Abs 1.7 - 7.7 K/uL 7.0  5.6  7.6   Lymphocytes Relative % 16  22  18    Lymphs Abs 0.7 - 4.0 K/uL 1.7  2.2  2.2   Monocytes Relative % 10  9  9    Monocytes Absolute 0.1 - 1.0 K/uL 1.0  0.9  1.1High    Eosinophils Relative % 4  4  5    Eosinophils Absolute 0.0 - 0.5 K/uL 0.5  0.4  0.6High    Basophils Relative % 2  1  2    Basophils Absolute 0.0 - 0.1 K/uL 0.2High   0.1  0.2High    Immature Granulocytes % 4  7  5    Abs Immature Granulocytes 0.00 - 0.07 K/uL 0.46High   0.70High  CM  0.58High  CM   Comment: Performed at [facility], [address], [place], [place] [address]  Resulting Agency  [facility]IN LAB [facility] LAB [facility] LAB      Specimen Collected: 10/26/18 10:58 Last Resulted: 10/26/18 11:17

## 2018-11-09 MED FILL — JAKAFI 15 MG TABLET: 15 | 30 days supply | Qty: 60 | Fill #0

## 2018-11-16 ENCOUNTER — Other Ambulatory Visit: Payer: Self-pay

## 2018-11-17 ENCOUNTER — Inpatient Hospital Stay: Payer: Medicare Other | Attending: Hematology and Oncology | Admitting: Oncology

## 2018-11-17 DIAGNOSIS — D45 Polycythemia vera: Secondary | ICD-10-CM | POA: Diagnosis not present

## 2018-11-17 DIAGNOSIS — D649 Anemia, unspecified: Secondary | ICD-10-CM | POA: Insufficient documentation

## 2018-11-17 DIAGNOSIS — N289 Disorder of kidney and ureter, unspecified: Secondary | ICD-10-CM | POA: Insufficient documentation

## 2018-11-17 LAB — VITAMIN B12: Vitamin B-12: 477 pg/mL (ref 180–914)

## 2018-11-17 LAB — CBC
HCT: 28 % — ABNORMAL LOW (ref 36.0–46.0)
Hemoglobin: 8.9 g/dL — ABNORMAL LOW (ref 12.0–15.0)
MCH: 29.5 pg (ref 26.0–34.0)
MCHC: 31.8 g/dL (ref 30.0–36.0)
MCV: 92.7 fL (ref 80.0–100.0)
Platelets: 331 10*3/uL (ref 150–400)
RBC: 3.02 MIL/uL — ABNORMAL LOW (ref 3.87–5.11)
RDW: 23.1 % — ABNORMAL HIGH (ref 11.5–15.5)
WBC: 8.9 10*3/uL (ref 4.0–10.5)
nRBC: 0.5 % — ABNORMAL HIGH (ref 0.0–0.2)

## 2018-11-17 LAB — IRON AND TIBC
Iron: 99 ug/dL (ref 28–170)
Saturation Ratios: 37 % — ABNORMAL HIGH (ref 10.4–31.8)
TIBC: 270 ug/dL (ref 250–450)
UIBC: 171 ug/dL

## 2018-11-17 LAB — RETICULOCYTES
Immature Retic Fract: 25 % — ABNORMAL HIGH (ref 2.3–15.9)
RBC.: 3.14 MIL/uL — ABNORMAL LOW (ref 3.87–5.11)
Retic Count, Absolute: 58.1 10*3/uL (ref 19.0–186.0)
Retic Ct Pct: 1.9 % (ref 0.4–3.1)

## 2018-11-17 LAB — FERRITIN: Ferritin: 858 ng/mL — ABNORMAL HIGH (ref 11–307)

## 2018-11-17 LAB — FOLATE: Folate: 36 ng/mL (ref >5.9)

## 2018-11-19 ENCOUNTER — Other Ambulatory Visit: Payer: Self-pay | Admitting: Oncology

## 2018-11-19 DIAGNOSIS — D45 Polycythemia vera: Secondary | ICD-10-CM

## 2018-11-19 DIAGNOSIS — D649 Anemia, unspecified: Secondary | ICD-10-CM

## 2018-11-19 NOTE — Progress Notes (Signed)
Spoke with Dr. Mike Gip who recommends CBC and met C in about 1 week.   Anemia likely secondary to [place].   Orders placed and secured chat message sent for lab encounter to be made for patient.  Faythe Casa, NP 11/19/2018 4:16 PM

## 2018-11-19 NOTE — Progress Notes (Signed)
Plt count is better. She is more anemic. Elevated ferritin and iron saturations. Reticulocyte count elevated. I spoke to daughter and she states that she has not noticed any bleeding but had one episode of diarrhea earlier this week. Notes dehydration and is trying to pushing fluids. States she is "tired". Denies fever or respiratory concerns. Would you like to see her back soon for assessment? Does she need additional lab work?   Rulon Abide

## 2018-11-20 DIAGNOSIS — N2581 Secondary hyperparathyroidism of renal origin: Secondary | ICD-10-CM | POA: Diagnosis not present

## 2018-11-20 DIAGNOSIS — D45 Polycythemia vera: Secondary | ICD-10-CM | POA: Diagnosis not present

## 2018-11-20 DIAGNOSIS — R809 Proteinuria, unspecified: Secondary | ICD-10-CM | POA: Diagnosis not present

## 2018-11-20 DIAGNOSIS — N183 Chronic kidney disease, stage 3 (moderate): Secondary | ICD-10-CM | POA: Diagnosis not present

## 2018-11-26 ENCOUNTER — Inpatient Hospital Stay (HOSPITAL_BASED_OUTPATIENT_CLINIC_OR_DEPARTMENT_OTHER): Payer: Medicare Other | Admitting: Oncology

## 2018-11-26 ENCOUNTER — Other Ambulatory Visit: Payer: Self-pay

## 2018-11-26 DIAGNOSIS — R809 Proteinuria, unspecified: Secondary | ICD-10-CM | POA: Diagnosis not present

## 2018-11-26 DIAGNOSIS — D649 Anemia, unspecified: Secondary | ICD-10-CM

## 2018-11-26 DIAGNOSIS — D45 Polycythemia vera: Secondary | ICD-10-CM

## 2018-11-26 DIAGNOSIS — N183 Chronic kidney disease, stage 3 (moderate): Secondary | ICD-10-CM | POA: Diagnosis not present

## 2018-11-26 DIAGNOSIS — N289 Disorder of kidney and ureter, unspecified: Secondary | ICD-10-CM | POA: Diagnosis not present

## 2018-11-26 LAB — COMPREHENSIVE METABOLIC PANEL
ALT: 12 U/L (ref 0–44)
AST: 20 U/L (ref 15–41)
Albumin: 3.9 g/dL (ref 3.5–5.0)
Alkaline Phosphatase: 70 U/L (ref 38–126)
Anion gap: 11 (ref 5–15)
BUN: 28 mg/dL — ABNORMAL HIGH (ref 8–23)
CO2: 24 mmol/L (ref 22–32)
Calcium: 9.1 mg/dL (ref 8.9–10.3)
Chloride: 100 mmol/L (ref 98–111)
Creatinine, Ser: 1.54 mg/dL — ABNORMAL HIGH (ref 0.44–1.00)
GFR calc Af Amer: 34 mL/min — ABNORMAL LOW (ref >60)
GFR calc non Af Amer: 29 mL/min — ABNORMAL LOW (ref >60)
Glucose, Bld: 111 mg/dL — ABNORMAL HIGH (ref 70–99)
Potassium: 4 mmol/L (ref 3.5–5.1)
Sodium: 135 mmol/L (ref 135–145)
Total Bilirubin: 0.4 mg/dL (ref 0.3–1.2)
Total Protein: 7.2 g/dL (ref 6.5–8.1)

## 2018-11-26 LAB — CBC WITH DIFFERENTIAL/PLATELET
Abs Immature Granulocytes: 0.3 10*3/uL — ABNORMAL HIGH (ref 0.00–0.07)
Band Neutrophils: 3 %
Basophils Absolute: 0 10*3/uL (ref 0.0–0.1)
Basophils Relative: 0 %
Eosinophils Absolute: 0.4 10*3/uL (ref 0.0–0.5)
Eosinophils Relative: 4 %
HCT: 28.4 % — ABNORMAL LOW (ref 36.0–46.0)
Hemoglobin: 8.9 g/dL — ABNORMAL LOW (ref 12.0–15.0)
Lymphocytes Relative: 18 %
Lymphs Abs: 1.9 10*3/uL (ref 0.7–4.0)
MCH: 29.2 pg (ref 26.0–34.0)
MCHC: 31.3 g/dL (ref 30.0–36.0)
MCV: 93.1 fL (ref 80.0–100.0)
Metamyelocytes Relative: 1 %
Monocytes Absolute: 0.8 10*3/uL (ref 0.1–1.0)
Monocytes Relative: 8 %
Myelocytes: 2 %
Neutro Abs: 7 10*3/uL (ref 1.7–7.7)
Neutrophils Relative %: 64 %
Platelets: 388 10*3/uL (ref 150–400)
RBC: 3.05 MIL/uL — ABNORMAL LOW (ref 3.87–5.11)
RDW: 22.7 % — ABNORMAL HIGH (ref 11.5–15.5)
Smear Review: ADEQUATE
WBC: 10.5 10*3/uL (ref 4.0–10.5)
nRBC: 0.3 % — ABNORMAL HIGH (ref 0.0–0.2)

## 2018-11-30 NOTE — Progress Notes (Signed)
Anemia stable. You are scheduled to see her in about 10 days. Worsening renal function likely d/t fluid intake. Trying to consume more water.  She is still on [place]. Is that follow-up ok?   Rulon Abide

## 2018-12-04 ENCOUNTER — Other Ambulatory Visit: Payer: Self-pay | Admitting: Hematology and Oncology

## 2018-12-04 DIAGNOSIS — D45 Polycythemia vera: Secondary | ICD-10-CM

## 2018-12-04 NOTE — Telephone Encounter (Signed)
CBC with Differential/Platelet Order: [id number] Status:  Final result Visible to patient:  Yes (MyChart) Next appt:  12/10/2018 at 01:15 PM in Oncology ([facility]) Dx:  Polycythemia rubra vera ([place]); Anemia...  Ref Range & Units 8d ago 2wk ago 32mo ago  WBC 4.0 - 10.5 K/uL 10.5  8.9  10.9High    RBC 3.87 - 5.11 MIL/uL 3.05Low   3.02Low   3.28Low    Hemoglobin 12.0 - 15.0 g/dL 8.9Low   8.9Low   9.6Low    HCT 36.0 - 46.0 % 28.4Low   28.0Low   30.6Low    MCV 80.0 - 100.0 fL 93.1  92.7  93.3   MCH 26.0 - 34.0 pg 29.2  29.5  29.3   MCHC 30.0 - 36.0 g/dL 31.3  31.8  31.4   RDW 11.5 - 15.5 % 22.7High   23.1High   23.0High    Platelets 150 - 400 K/uL 388  331  424High    nRBC 0.0 - 0.2 % 0.3High   0.5High  CM  0.2   Neutrophils Relative % % 64   64   Neutro Abs 1.7 - 7.7 K/uL 7.0   7.0   Band Neutrophils % 3     Lymphocytes Relative % 18   16   Lymphs Abs 0.7 - 4.0 K/uL 1.9   1.7   Monocytes Relative % 8   10   Monocytes Absolute 0.1 - 1.0 K/uL 0.8   1.0   Eosinophils Relative % 4   4   Eosinophils Absolute 0.0 - 0.5 K/uL 0.4   0.5   Basophils Relative % 0   2   Basophils Absolute 0.0 - 0.1 K/uL 0.0   0.2High    Smear Review  PLATELETS APPEAR ADEQUATE     Metamyelocytes Relative % 1     Myelocytes % 2     Abs Immature Granulocytes 0.00 - 0.07 K/uL 0.30High    0.46High  CM   Dimorphism  PRESENT     Ovalocytes  PRESENT     Comment: Performed at [facility], [address], [place], [place] [address]  Immature Granulocytes    4 R   Resulting Agency  [facility] CL[facility] LAB [place] [facility] LAB      Specimen Collected: 11/26/18 11:03 Last Resulted: 11/26/18 11:32     Lab Flowsheet   Order Details   View Encounter   Lab and Collection Details   Routing   Result History     CM=Additional commentsR=Reference range differs from displayed range      Other Results from 11/26/2018  Result Notes for Comprehensive metabolic panel  Notes recorded by Jacquelin Hawking,  NP on 11/30/2018 at 12:11 PM EDT  Anemia stable. You are scheduled to see her in about 10 days. Worsening renal function likely d/t fluid intake. Trying to consume more water. She is still on [place]. Is that follow-up ok?   Rulon Abide   Contains abnormal data Comprehensive metabolic panel Order: [id number]  Status:  Final result Visible to patient:  Yes (MyChart) Next appt:  12/10/2018 at 01:15 PM in Oncology ([facility]) Dx:  Polycythemia rubra vera ([place]); Anemia...  Ref Range & Units 8d ago 19mo ago 18mo ago  Sodium 135 - 145 mmol/L 135  134Low   134Low    Potassium 3.5 - 5.1 mmol/L 4.0  4.3  4.2   Chloride 98 - 111 mmol/L 100  100  101   CO2 22 -  32 mmol/L 24  25  25    Glucose, Bld 70 - 99 mg/dL 111High   89  87   BUN 8 - 23 mg/dL 28High   27High   25High    Creatinine, Ser 0.44 - 1.00 mg/dL 1.54High   1.29High   1.21High    Calcium 8.9 - 10.3 mg/dL 9.1  9.1  9.0   Total Protein 6.5 - 8.1 g/dL 7.2  7.2  7.0   Albumin 3.5 - 5.0 g/dL 3.9  4.1  4.2   AST 15 - 41 U/L 20  20  20    ALT 0 - 44 U/L 12  14  14    Alkaline Phosphatase 38 - 126 U/L 70  74  77   Total Bilirubin 0.3 - 1.2 mg/dL 0.4  0.6  0.6   GFR calc non Af Amer >60 mL/min 29Low   37Low   40Low    GFR calc Af Amer >60 mL/min 34Low   43Low   46Low    Anion gap 5 - 15 11  9  CM  8 CM   Comment: Performed at [facility], [address] [place], [place] [address]  Resulting Agency  [facility] CLIN LAB [facility] CLIN LAB [facility] LAB      Specimen Collected: 11/26/18 11:03 Last Resulted: 11/26/18 11:22

## 2018-12-07 ENCOUNTER — Other Ambulatory Visit: Payer: Self-pay

## 2018-12-07 ENCOUNTER — Ambulatory Visit: Payer: Medicare Other

## 2018-12-07 ENCOUNTER — Encounter: Payer: Self-pay | Admitting: Emergency Medicine

## 2018-12-07 ENCOUNTER — Ambulatory Visit (INDEPENDENT_AMBULATORY_CARE_PROVIDER_SITE_OTHER)
Admission: EM | Admit: 2018-12-07 | Discharge: 2018-12-07 | Disposition: A | Discharge status: Nursing Home (Includes ICF) | Payer: Medicare Other | Source: Home / Self Care | Attending: Family Medicine | Admitting: Family Medicine

## 2018-12-07 ENCOUNTER — Emergency Department
Admission: EM | Admit: 2018-12-07 | Discharge: 2018-12-07 | Disposition: A | Discharge status: Home / Self Care | Payer: Medicare Other | Attending: Emergency Medicine | Admitting: Emergency Medicine

## 2018-12-07 ENCOUNTER — Emergency Department: Payer: Medicare Other

## 2018-12-07 DIAGNOSIS — Z79899 Other long term (current) drug therapy: Secondary | ICD-10-CM | POA: Insufficient documentation

## 2018-12-07 DIAGNOSIS — R509 Fever, unspecified: Secondary | ICD-10-CM | POA: Insufficient documentation

## 2018-12-07 DIAGNOSIS — Z87891 Personal history of nicotine dependence: Secondary | ICD-10-CM | POA: Diagnosis not present

## 2018-12-07 DIAGNOSIS — R05 Cough: Secondary | ICD-10-CM | POA: Insufficient documentation

## 2018-12-07 DIAGNOSIS — I959 Hypotension, unspecified: Secondary | ICD-10-CM

## 2018-12-07 DIAGNOSIS — R531 Weakness: Secondary | ICD-10-CM | POA: Insufficient documentation

## 2018-12-07 DIAGNOSIS — Z20828 Contact with and (suspected) exposure to other viral communicable diseases: Secondary | ICD-10-CM | POA: Insufficient documentation

## 2018-12-07 LAB — CBC WITH DIFFERENTIAL/PLATELET
Abs Immature Granulocytes: 0.97 10*3/uL — ABNORMAL HIGH (ref 0.00–0.07)
Basophils Absolute: 0.1 10*3/uL (ref 0.0–0.1)
Basophils Relative: 1 %
Eosinophils Absolute: 0.4 10*3/uL (ref 0.0–0.5)
Eosinophils Relative: 3 %
HCT: 28.8 % — ABNORMAL LOW (ref 36.0–46.0)
Hemoglobin: 9 g/dL — ABNORMAL LOW (ref 12.0–15.0)
Immature Granulocytes: 8 %
Lymphocytes Relative: 6 %
Lymphs Abs: 0.7 10*3/uL (ref 0.7–4.0)
MCH: 29.1 pg (ref 26.0–34.0)
MCHC: 31.3 g/dL (ref 30.0–36.0)
MCV: 93.2 fL (ref 80.0–100.0)
Monocytes Absolute: 1.1 10*3/uL — ABNORMAL HIGH (ref 0.1–1.0)
Monocytes Relative: 9 %
Neutro Abs: 9 10*3/uL — ABNORMAL HIGH (ref 1.7–7.7)
Neutrophils Relative %: 73 %
Platelets: 403 10*3/uL — ABNORMAL HIGH (ref 150–400)
RBC: 3.09 MIL/uL — ABNORMAL LOW (ref 3.87–5.11)
RDW: 22.4 % — ABNORMAL HIGH (ref 11.5–15.5)
Smear Review: NORMAL
WBC: 12.2 10*3/uL — ABNORMAL HIGH (ref 4.0–10.5)
nRBC: 0.2 % (ref 0.0–0.2)

## 2018-12-07 LAB — TSH: TSH: 1.718 u[IU]/mL (ref 0.350–4.500)

## 2018-12-07 LAB — HEPATIC FUNCTION PANEL
ALT: 12 U/L (ref 0–44)
AST: 19 U/L (ref 15–41)
Albumin: 3.7 g/dL (ref 3.5–5.0)
Alkaline Phosphatase: 63 U/L (ref 38–126)
Bilirubin, Direct: <0.1 mg/dL (ref 0.0–0.2)
Total Bilirubin: 0.4 mg/dL (ref 0.3–1.2)
Total Protein: 7.4 g/dL (ref 6.5–8.1)

## 2018-12-07 LAB — URINALYSIS, COMPLETE (UACMP) WITH MICROSCOPIC
Bilirubin Urine: NEGATIVE
Glucose, UA: NEGATIVE mg/dL
Hgb urine dipstick: NEGATIVE
Ketones, ur: NEGATIVE mg/dL
Leukocytes,Ua: NEGATIVE
Nitrite: NEGATIVE
Protein, ur: NEGATIVE mg/dL
Specific Gravity, Urine: 1.01 (ref 1.005–1.030)
pH: 5 (ref 5.0–8.0)

## 2018-12-07 LAB — LACTIC ACID, PLASMA
Lactic Acid, Venous: 2.2 mmol/L (ref 0.5–1.9)
Lactic Acid, Venous: 2.2 mmol/L (ref 0.5–1.9)

## 2018-12-07 LAB — BASIC METABOLIC PANEL
Anion gap: 12 (ref 5–15)
BUN: 30 mg/dL — ABNORMAL HIGH (ref 8–23)
CO2: 24 mmol/L (ref 22–32)
Calcium: 9.3 mg/dL (ref 8.9–10.3)
Chloride: 96 mmol/L — ABNORMAL LOW (ref 98–111)
Creatinine, Ser: 1.36 mg/dL — ABNORMAL HIGH (ref 0.44–1.00)
GFR calc Af Amer: 40 mL/min — ABNORMAL LOW (ref >60)
GFR calc non Af Amer: 34 mL/min — ABNORMAL LOW (ref >60)
Glucose, Bld: 132 mg/dL — ABNORMAL HIGH (ref 70–99)
Potassium: 4.2 mmol/L (ref 3.5–5.1)
Sodium: 132 mmol/L — ABNORMAL LOW (ref 135–145)

## 2018-12-07 LAB — T4, FREE: Free T4: 0.85 ng/dL (ref 0.61–1.12)

## 2018-12-07 LAB — MAGNESIUM: Magnesium: 2.4 mg/dL (ref 1.7–2.4)

## 2018-12-07 LAB — SARS CORONAVIRUS 2 BY RT PCR (HOSPITAL ORDER, PERFORMED IN ~~LOC~~ HOSPITAL LAB): SARS Coronavirus 2: NEGATIVE

## 2018-12-07 LAB — TROPONIN I (HIGH SENSITIVITY)
Troponin I (High Sensitivity): 10 ng/L (ref <18)
Troponin I (High Sensitivity): 10 ng/L (ref <18)

## 2018-12-07 MED ORDER — SODIUM CHLORIDE 0.9 % IV BOLUS: 1000.0000 mL | Freq: Once | INTRAVENOUS | Status: DC

## 2018-12-07 MED ORDER — SODIUM CHLORIDE 0.9 % IV BOLUS
1000.0000 mL | Freq: Once | INTRAVENOUS | Status: AC
  Administered 2018-12-07: 1000 mL via INTRAVENOUS

## 2018-12-07 MED ORDER — SODIUM CHLORIDE 0.9% FLUSH: 3.0000 mL | Freq: Once | INTRAVENOUS | Status: DC

## 2018-12-07 NOTE — ED Notes (Signed)
Only able to obtain 1 set of blood cultures. When bottles are attached to Kurin device, bottle only fills 3-4 mLs, about 6-73mLs short of what is needed to bring sample to fill line requiring syringe to draw remainder of sample. Peripheral stick attempt filled 1-93mLs, pt does not want additional sticks at this time. EDP [place] notified.

## 2018-12-07 NOTE — ED Provider Notes (Signed)
[facility]    CSN: [id number] Arrival date & time: 12/07/18  1120      History   Chief Complaint Chief Complaint  Patient presents with  . Fever    HPI Laura Wilcox is a 85 y.o. female.   85 yo female with a h/o polycythemia vera (on immunosuppressant) presents with a daughter with a c/o progressively worsening generalized weakness for the past 2 weeks and new onset cough and fever since yesterday. Per daughter yesterday and today patient has been so weak that she couldn't walk from one room to the next in her apartment. Patient denies any chills, chest pains, shortness of breath, vomiting, diarrhea, dysuria, hematuria, pain, rash.    Fever   Past Medical History:  Diagnosis Date  . Polycythemia     Patient Active Problem List   Diagnosis Date Noted  . Hyperparathyroidism ([place]) 02/19/2018  . Normocytic anemia 02/18/2018  . Decreased renal function 02/18/2018  . Renal insufficiency 02/18/2018  . Elevated ferritin 02/18/2018  . Goals of care, counseling/discussion 07/09/2017  . Polycythemia rubra vera ([place]) 06/02/2017  . Underweight 06/02/2017  . Urticaria 06/02/2017  . Advanced care planning/counseling discussion 06/02/2017  . Mild cognitive impairment 06/02/2017    Past Surgical History:  Procedure Laterality Date  . ABDOMINAL HYSTERECTOMY     complete  . BREAST SURGERY      OB History   No obstetric history on file.      Home Medications    Prior to Admission medications   Medication Sig Start Date End Date Taking? Authorizing Provider  calcitRIOL (ROCALTROL) 0.25 MCG capsule Take 0.25 mcg by mouth 3 (three) times a week. 02/11/18  Yes [provider]  JAKAFI 15 MG tablet TAKE 1 TABLET (15 MG TOTAL) BY MOUTH 2 TIMES DAILY FOR 30 DAYS. 12/04/18  Yes Lequita Asal, MD    Family History Family History  Problem Relation Age of Onset  . Heart disease Father   . Cancer Sister        breast  . Cancer Sister        breast   . Alzheimer's disease Sister     Social History Social History   Tobacco Use  . Smoking status: Former Smoker    Packs/day: 1.00    Years: 30.00    Pack years: 30.00    Quit date: 04/02/1978    Years since quitting: 40.7  . Smokeless tobacco: Never Used  Substance Use Topics  . Alcohol use: No    Frequency: Never    Comment: occasional glass of wine  . Drug use: No     Allergies   Patient has no known allergies.   Review of Systems Review of Systems  Constitutional: Positive for fever.     Physical Exam Triage Vital Signs ED Triage Vitals  Enc Vitals Group     BP 12/07/18 1132 (!) 65/57     Pulse Rate 12/07/18 1132 82     Resp 12/07/18 1132 18     Temp 12/07/18 1132 98.2 F (36.8 C)     Temp src --      SpO2 12/07/18 1132 100 %     Weight 12/07/18 1136 108 lb (49 kg)     Height 12/07/18 1136 5\' 5"  (1.651 m)     Head Circumference --      Peak Flow --      Pain Score 12/07/18 1136 0     Pain Loc --  Pain Edu? --      Excl. in [place]? --    No data found.  Updated Vital Signs BP (!) 65/57 (BP Location: Left Arm)   Pulse 82   Temp 98.2 F (36.8 C)   Resp 18   Ht 5\' 5"  (1.651 m)   Wt 49 kg   SpO2 100%   BMI 17.97 kg/m   Visual Acuity Right Eye Distance:   Left Eye Distance:   Bilateral Distance:    Right Eye Near:   Left Eye Near:    Bilateral Near:     Physical Exam Vitals signs and nursing note reviewed.  Constitutional:      General: She is not in acute distress.    Appearance: She is not toxic-appearing or diaphoretic.  Neck:     Musculoskeletal: Neck supple.  Cardiovascular:     Rate and Rhythm: Normal rate and regular rhythm.     Heart sounds: Normal heart sounds.  Pulmonary:     Effort: Pulmonary effort is normal. No respiratory distress.     Breath sounds: Normal breath sounds. No stridor. No wheezing, rhonchi or rales.  Abdominal:     General: There is no distension.     Palpations: Abdomen is soft.  Neurological:      Mental Status: She is alert. Mental status is at baseline.      UC Treatments / Results  Labs (all labs ordered are listed, but only abnormal results are displayed) Labs Reviewed  NOVEL CORONAVIRUS, NAA (HOSP ORDER, SEND-OUT TO REF LAB; TAT 18-24 HRS)    EKG   Radiology No results found.  Procedures Procedures (including critical care time)  Medications Ordered in UC Medications - No data to display  Initial Impression / Assessment and Plan / UC Course  I have reviewed the triage vital signs and the nursing notes.  Pertinent labs & imaging results that were available during my care of the patient were reviewed by me and considered in my medical decision making (see chart for details).      Final Clinical Impressions(s) / UC Diagnoses   Final diagnoses:  Hypotension, unspecified hypotension type  Generalized weakness  Fever, unspecified     Discharge Instructions     Recommend patient go to Emergency Department for further evaluation and management    ED Prescriptions    None      1. Possible diagnosis reviewed with patient and daughter; recommend patient go to Emergency Department for further evaluation and management. Declined EMS and will proceed by private vehicle with daughter and her husband driving patient. Report called to triage RN at [facility] ED.    Controlled Substance Prescriptions [place] Controlled Substance Registry consulted? Not Applicable   Norval Gable, MD 12/07/18 1249

## 2018-12-07 NOTE — ED Triage Notes (Signed)
Patients daughter states patient has had increasing tiredness and weakness for the past 12 days.  Woke this morning with fever.

## 2018-12-07 NOTE — Discharge Instructions (Signed)
Recommend patient go to Emergency Department for further evaluation and management °

## 2018-12-07 NOTE — ED Triage Notes (Signed)
FIRST NURSE NOTE-sent from urgent care for low BP. BP WNL here, checked at first nurse desk.  Pt appears well.  Fever 100.6 per daughter at home.

## 2018-12-07 NOTE — ED Notes (Signed)
Date and time results received: 12/07/18 1647  Test: Lactic Acid Critical Value: 2.2  Name of Provider Notified: Dr. Jari Pigg  Orders Received? Or Actions Taken?: no new orders at this time

## 2018-12-07 NOTE — ED Notes (Signed)

## 2018-12-07 NOTE — ED Notes (Signed)
BP checked in both arms: L arm - 98/69 R arm - 150/71  HR same for both. Pt denies any chest pain or SOB at this time. EDP [place] notified.

## 2018-12-07 NOTE — ED Provider Notes (Signed)
[facility] Emergency Department Provider Note  ____________________________________________   First MD Initiated Contact with Patient 12/07/18 1327     (approximate)  I have reviewed the triage vital signs and the nursing notes.   HISTORY  Chief Complaint Weakness    HPI Laura Wilcox is a 85 y.o. female with polycythemia who presents with generalized weakness.  Patient is had about 1 week of weakness.  Daughter has been staying with her and noted that she is just not able to move around as well as normal.  She is also been having some chills.  2 days ago she developed a cough.  And that this morning she had temperature of 100.6.  Patient did not get a Tylenol.  Patient went to urgent care and was recommended to come to the emergency department given some low blood pressures.  Patient did have a tooth come out a few days ago.  No skin sources.  Fever onset this morning, nothing makes better, nothing makes it worse, occurred one time.     Past Medical History:  Diagnosis Date  . Polycythemia     Patient Active Problem List   Diagnosis Date Noted  . Hyperparathyroidism ([place]) 02/19/2018  . Normocytic anemia 02/18/2018  . Decreased renal function 02/18/2018  . Renal insufficiency 02/18/2018  . Elevated ferritin 02/18/2018  . Goals of care, counseling/discussion 07/09/2017  . Polycythemia rubra vera ([place]) 06/02/2017  . Underweight 06/02/2017  . Urticaria 06/02/2017  . Advanced care planning/counseling discussion 06/02/2017  . Mild cognitive impairment 06/02/2017    Past Surgical History:  Procedure Laterality Date  . ABDOMINAL HYSTERECTOMY     complete  . BREAST SURGERY      Prior to Admission medications   Medication Sig Start Date End Date Taking? Authorizing Provider  calcitRIOL (ROCALTROL) 0.25 MCG capsule Take 0.25 mcg by mouth 3 (three) times a week. 02/11/18   [provider]  JAKAFI 15 MG tablet TAKE 1 TABLET (15 MG  TOTAL) BY MOUTH 2 TIMES DAILY FOR 30 DAYS. 12/04/18   Lequita Asal, MD    Allergies Patient has no known allergies.  Family History  Problem Relation Age of Onset  . Heart disease Father   . Cancer Sister        breast  . Cancer Sister        breast  . Alzheimer's disease Sister     Social History Social History   Tobacco Use  . Smoking status: Former Smoker    Packs/day: 1.00    Years: 30.00    Pack years: 30.00    Quit date: 04/02/1978    Years since quitting: 40.7  . Smokeless tobacco: Never Used  Substance Use Topics  . Alcohol use: No    Frequency: Never    Comment: occasional glass of wine  . Drug use: No      Review of Systems Constitutional: Positive fever Eyes: No visual changes. ENT: No sore throat. Cardiovascular: Denies chest pain. Respiratory: Denies shortness of breath.  Positive cough Gastrointestinal: No abdominal pain.  No nausea, no vomiting.  No diarrhea.  No constipation. Genitourinary: Negative for dysuria. Musculoskeletal: Negative for back pain. Skin: Negative for rash. Neurological: Negative for headaches, positive generalized weakness All other ROS negative ____________________________________________   PHYSICAL EXAM:  VITAL SIGNS: ED Triage Vitals  Enc Vitals Group     BP 12/07/18 1250 126/65     Pulse Rate 12/07/18 1250 87     Resp 12/07/18 1250 16  Temp 12/07/18 1250 98.8 F (37.1 C)     Temp Source 12/07/18 1250 Oral     SpO2 12/07/18 1250 100 %     Weight 12/07/18 1312 108 lb 0.4 oz (49 kg)     Height --      Head Circumference --      Peak Flow --      Pain Score 12/07/18 1311 0     Pain Loc --      Pain Edu? --      Excl. in [place]? --     Constitutional: Alert and oriented. Well appearing and in no acute distress. Eyes: Conjunctivae are normal. EOMI. Head: Atraumatic. Nose: No congestion/rhinnorhea. Mouth/Throat: Mucous membranes are moist.  Oropharynx is clear.  No evidence of infection or abscess. Neck:  No stridor. Trachea Midline. FROM Cardiovascular: Normal rate, regular rhythm. Grossly normal heart sounds.  Good peripheral circulation. Respiratory: Normal respiratory effort.  No retractions. Lungs CTAB.  Occasional cough Gastrointestinal: Soft and nontender. No distention. No abdominal bruits.  Musculoskeletal: No lower extremity tenderness nor edema.  No joint effusions. Neurologic:  Normal speech and language. No gross focal neurologic deficits are appreciated.  Skin:  Skin is warm, dry and intact. No rash noted. Psychiatric: Mood and affect are normal. Speech and behavior are normal. GU: Deferred   ____________________________________________   LABS (all labs ordered are listed, but only abnormal results are displayed)  Labs Reviewed  BASIC METABOLIC PANEL - Abnormal; Notable for the following components:      Result Value   Sodium 132 (*)    Chloride 96 (*)    Glucose, Bld 132 (*)    BUN 30 (*)    Creatinine, Ser 1.36 (*)    GFR calc non Af Amer 34 (*)    GFR calc Af Amer 40 (*)    All other components within normal limits  URINALYSIS, COMPLETE (UACMP) WITH MICROSCOPIC - Abnormal; Notable for the following components:   Color, Urine YELLOW (*)    APPearance HAZY (*)    Bacteria, UA MANY (*)    All other components within normal limits  LACTIC ACID, PLASMA - Abnormal; Notable for the following components:   Lactic Acid, Venous 2.2 (*)    All other components within normal limits  LACTIC ACID, PLASMA - Abnormal; Notable for the following components:   Lactic Acid, Venous 2.2 (*)    All other components within normal limits  CBC WITH DIFFERENTIAL/PLATELET - Abnormal; Notable for the following components:   WBC 12.2 (*)    RBC 3.09 (*)    Hemoglobin 9.0 (*)    HCT 28.8 (*)    RDW 22.4 (*)    Platelets 403 (*)    Neutro Abs 9.0 (*)    Monocytes Absolute 1.1 (*)    Abs Immature Granulocytes 0.97 (*)    All other components within normal limits  SARS CORONAVIRUS 2  (HOSPITAL ORDER, [place] [facility])  CULTURE, BLOOD (ROUTINE X 2)  CULTURE, BLOOD (ROUTINE X 2)  MAGNESIUM  TSH  T4, FREE  HEPATIC FUNCTION PANEL  TROPONIN I (HIGH SENSITIVITY)  TROPONIN I (HIGH SENSITIVITY)   ____________________________________________   ED ECG REPORT I, Vanessa [facility], the attending physician, personally viewed and interpreted this ECG.  EKG is normal sinus, no ST elevation, no T wave inversion, normal intervals ____________________________________________  RADIOLOGY Robert Bellow, personally viewed and evaluated these images (plain radiographs) as part of my medical decision making, as well  as reviewing the written report by the radiologist.  ED MD interpretation: No pneumonia  Official radiology report(s): Dg Chest Portable 1 View  Result Date: 12/07/2018 CLINICAL DATA:  Progressively worsening weakness. Cough and fever since yesterday. Ex-smoker. EXAM: PORTABLE CHEST 1 VIEW COMPARISON:  None. FINDINGS: Normal sized heart. Clear lungs. Mild elevation of the left hemidiaphragm with minimal left basilar atelectasis or scarring. Mild hyperexpansion of the lungs with minimal biapical pleural and parenchymal scarring. Mild scoliosis. Diffuse osteopenia. Right rotator cuff calcifications. IMPRESSION: 1. No acute abnormality. 2. Mild elevation of the left hemidiaphragm with minimal left basilar atelectasis or scarring. 3. Mild changes of COPD. Electronically Signed   By: Claudie Revering M.D.   On: 12/07/2018 13:57    ____________________________________________   PROCEDURES  Procedure(s) performed (including Critical Care):  Procedures   ____________________________________________   INITIAL IMPRESSION / ASSESSMENT AND PLAN / ED COURSE  Laura Wilcox was evaluated in Emergency Department on 12/07/2018 for the symptoms described in the history of present illness. She was evaluated in the context of the global COVID-19 pandemic, which  necessitated consideration that the patient might be at risk for infection with the SARS-CoV-2 virus that causes COVID-19. Institutional protocols and algorithms that pertain to the evaluation of patients at risk for COVID-19 are in a state of rapid change based on information released by regulatory bodies including the CDC and federal and state organizations. These policies and algorithms were followed during the patient's care in the ED.    Patient is a 85 year old who presents with generalized weakness and cough.  Also noted to be febrile at home although no fever here and was not given any Tylenol or ibuprofen before arriving.  Will get labs to evaluate for bacteremia, UTI, pneumonia, coronavirus.  Also given the generalized weakness will get labs to evaluate for ACS or hypothyroidism.  No evidence of infection in her mouth.  We will give 1 L of fluid for the low blood pressure and continue to closely monitor.  Bps noted to be different in different arms. Most likely from atherosclerosis. No chest pain to suggest dissection.   Kidney function is around baseline at 1.36.  Thyroid studies are normal.  Trop was 10.  White count was elevated at 12.2.  Hemoglobin is stable from baseline.  6:10 PM reevaluated patient.  Patient is not spiked a temperature.  Patient denies any symptoms at this time.  Patient is been able to ambulate to the bathroom well.  Repeat troponin was stable.  Urine without evidence of UTI.  Lactate was stable at 2.2.  This lactate was drawn with a tourniquet.  I suspect this is the reason that is slightly elevated given she is so well-appearing with normal blood pressure at this time.  I offered more fluids and repeat lactate versus discharge versus admission.  I had a lengthy discussion with patient and her daughter.  At this time patient's only SIRS criteria is the elevated white count.  She is not had any fever on multiple rechecks here and has not had any intervention with  Tylenol or ibuprofen.  We have gotten blood cultures.  We offered admission for observation versus going home and return precautions if she develops a fever.  At this time she has no evidence of UTI, pneumonia.  She denies shortness of breath to suggest PE.  She denies any chest pain or abdominal pain.  On discussion with the daughter they would prefer to go home.  They will return if she develops  a fever or any other new symptoms.    I discussed the provisional nature of ED diagnosis, the treatment so far, the ongoing plan of care, follow up appointments and return precautions with the patient and any family or support people present. They expressed understanding and agreed with the plan, discharged home.   ____________________________________________   FINAL CLINICAL IMPRESSION(S) / ED DIAGNOSES   Final diagnoses:  Weakness      MEDICATIONS GIVEN DURING THIS VISIT:  Medications  sodium chloride 0.9 % bolus 1,000 mL (0 mLs Intravenous Stopped 12/07/18 1737)     ED Discharge Orders    None       Note:  This document was prepared using Dragon voice recognition software and may include unintentional dictation errors.   Vanessa Mermentau, MD 12/07/18 2034

## 2018-12-07 NOTE — ED Notes (Signed)
2nd blood culture drawn from PIV, several hours apart from first sample. Test again needed to be drawn with syringe.

## 2018-12-07 NOTE — ED Notes (Signed)
Pt's 1st lactic acid drawn and sent 1530. Next should be drawn 1730. Time in Epic incorrect d/t mistakenly added on to previous blood collection in triage.

## 2018-12-07 NOTE — Discharge Instructions (Addendum)
Your work-up was reassuring except for a slightly elevated white count.  We have sent blood cultures.  If they are positive we will call you back.  Continue to drink plenty of fluids.  Return to the ER for fevers, shortness of breath, chest pain or any other concerns.

## 2018-12-08 ENCOUNTER — Telehealth: Payer: Self-pay

## 2018-12-08 LAB — BLOOD CULTURE ID PANEL (REFLEXED)

## 2018-12-08 NOTE — Telephone Encounter (Signed)
refill JAKAFI 15 mg 1 tab po BID for 30 day # 60 with no refills. The patient daughter has been made aware

## 2018-12-08 NOTE — Progress Notes (Signed)
PHARMACY - PHYSICIAN COMMUNICATION CRITICAL VALUE ALERT - BLOOD CULTURE IDENTIFICATION (BCID)  Laura Wilcox is an 85 y.o. female who presented to [facility] on 12/07/2018 with a chief complaint of fever, weakness  Assessment:  1/4 bottles staph species (mecA -)  Name of physician (or Provider) Contacted: Dr. Corky Downs  Current antibiotics: none  Changes to prescribed antibiotics recommended: Patient discharged from ED. Spoke with Dr. Corky Downs who wanted to see how patient was feeling. Able to get in contact with patient's daughter who is very involved in her care. She reports that the patient is still very weak and not much improved, but has not had an additional fever. Daughter also spoke with Dr. Corky Downs. Plan is to monitor patient and bring her back to ED if she does not improve. Suspect that culture is a contaminant given it is in one bottle.  Results for orders placed or performed during the hospital encounter of 12/07/18  Blood Culture ID Panel (Reflexed) (Collected: 12/07/2018  1:28 PM)  Result Value Ref Range   Enterococcus species NOT DETECTED NOT DETECTED   Listeria monocytogenes NOT DETECTED NOT DETECTED   Staphylococcus species DETECTED (A) NOT DETECTED   Staphylococcus aureus (BCID) NOT DETECTED NOT DETECTED   Methicillin resistance NOT DETECTED NOT DETECTED   Streptococcus species NOT DETECTED NOT DETECTED   Streptococcus agalactiae NOT DETECTED NOT DETECTED   Streptococcus pneumoniae NOT DETECTED NOT DETECTED   Streptococcus pyogenes NOT DETECTED NOT DETECTED   Acinetobacter baumannii NOT DETECTED NOT DETECTED   Enterobacteriaceae species NOT DETECTED NOT DETECTED   Enterobacter cloacae complex NOT DETECTED NOT DETECTED   Escherichia coli NOT DETECTED NOT DETECTED   Klebsiella oxytoca NOT DETECTED NOT DETECTED   Klebsiella pneumoniae NOT DETECTED NOT DETECTED   Proteus species NOT DETECTED NOT DETECTED   Serratia marcescens NOT DETECTED NOT DETECTED   Haemophilus influenzae  NOT DETECTED NOT DETECTED   Neisseria meningitidis NOT DETECTED NOT DETECTED   Pseudomonas aeruginosa NOT DETECTED NOT DETECTED   Candida albicans NOT DETECTED NOT DETECTED   Candida glabrata NOT DETECTED NOT DETECTED   Candida krusei NOT DETECTED NOT DETECTED   Candida parapsilosis NOT DETECTED NOT DETECTED   Candida tropicalis NOT DETECTED NOT DETECTED    Tawnya Crook, PharmD 12/08/2018  3:06 PM

## 2018-12-09 ENCOUNTER — Emergency Department: Payer: Medicare Other

## 2018-12-09 ENCOUNTER — Encounter: Payer: Self-pay | Admitting: Emergency Medicine

## 2018-12-09 ENCOUNTER — Other Ambulatory Visit: Payer: Self-pay

## 2018-12-09 ENCOUNTER — Emergency Department
Admission: EM | Admit: 2018-12-09 | Discharge: 2018-12-09 | Disposition: A | Discharge status: Home / Self Care | Payer: Medicare Other | Attending: Emergency Medicine | Admitting: Emergency Medicine

## 2018-12-09 DIAGNOSIS — R531 Weakness: Secondary | ICD-10-CM | POA: Diagnosis not present

## 2018-12-09 DIAGNOSIS — N390 Urinary tract infection, site not specified: Secondary | ICD-10-CM | POA: Diagnosis not present

## 2018-12-09 DIAGNOSIS — R5383 Other fatigue: Secondary | ICD-10-CM | POA: Diagnosis present

## 2018-12-09 DIAGNOSIS — Z87891 Personal history of nicotine dependence: Secondary | ICD-10-CM | POA: Insufficient documentation

## 2018-12-09 DIAGNOSIS — Z79899 Other long term (current) drug therapy: Secondary | ICD-10-CM | POA: Insufficient documentation

## 2018-12-09 LAB — COMPREHENSIVE METABOLIC PANEL
ALT: 17 U/L (ref 0–44)
AST: 34 U/L (ref 15–41)
Albumin: 3.4 g/dL — ABNORMAL LOW (ref 3.5–5.0)
Alkaline Phosphatase: 57 U/L (ref 38–126)
Anion gap: 14 (ref 5–15)
BUN: 22 mg/dL (ref 8–23)
CO2: 21 mmol/L — ABNORMAL LOW (ref 22–32)
Calcium: 8.9 mg/dL (ref 8.9–10.3)
Chloride: 100 mmol/L (ref 98–111)
Creatinine, Ser: 1.22 mg/dL — ABNORMAL HIGH (ref 0.44–1.00)
GFR calc Af Amer: 45 mL/min — ABNORMAL LOW (ref >60)
GFR calc non Af Amer: 39 mL/min — ABNORMAL LOW (ref >60)
Glucose, Bld: 111 mg/dL — ABNORMAL HIGH (ref 70–99)
Potassium: 4.4 mmol/L (ref 3.5–5.1)
Sodium: 135 mmol/L (ref 135–145)
Total Bilirubin: 0.5 mg/dL (ref 0.3–1.2)
Total Protein: 6.8 g/dL (ref 6.5–8.1)

## 2018-12-09 LAB — CBC
HCT: 24.2 % — ABNORMAL LOW (ref 36.0–46.0)
Hemoglobin: 7.5 g/dL — ABNORMAL LOW (ref 12.0–15.0)
MCH: 29.2 pg (ref 26.0–34.0)
MCHC: 31 g/dL (ref 30.0–36.0)
MCV: 94.2 fL (ref 80.0–100.0)
Platelets: 331 10*3/uL (ref 150–400)
RBC: 2.57 MIL/uL — ABNORMAL LOW (ref 3.87–5.11)
RDW: 22.4 % — ABNORMAL HIGH (ref 11.5–15.5)
WBC: 11.3 10*3/uL — ABNORMAL HIGH (ref 4.0–10.5)
nRBC: 0.2 % (ref 0.0–0.2)

## 2018-12-09 LAB — URINALYSIS, COMPLETE (UACMP) WITH MICROSCOPIC
Bilirubin Urine: NEGATIVE
Glucose, UA: NEGATIVE mg/dL
Ketones, ur: NEGATIVE mg/dL
Nitrite: POSITIVE — AB
Protein, ur: 30 mg/dL — AB
Specific Gravity, Urine: 1.018 (ref 1.005–1.030)
pH: 5 (ref 5.0–8.0)

## 2018-12-09 LAB — NOVEL CORONAVIRUS, NAA (HOSP ORDER, SEND-OUT TO REF LAB; TAT 18-24 HRS): SARS-CoV-2, NAA: NOT DETECTED

## 2018-12-09 MED ORDER — LORAZEPAM 0.5 MG PO TABS
0.5000 mg | ORAL_TABLET | Freq: Once | ORAL | Status: AC
  Administered 2018-12-09: 0.5 mg via ORAL
  Filled 2018-12-09: qty 1

## 2018-12-09 MED ORDER — CEPHALEXIN 500 MG PO CAPS: 500.0000 mg | ORAL_CAPSULE | Freq: Two times a day (BID) | ORAL | 0 refills | Status: DC

## 2018-12-09 MED ORDER — CEPHALEXIN 500 MG PO CAPS
500.0000 mg | ORAL_CAPSULE | Freq: Once | ORAL | Status: AC
  Administered 2018-12-09: 14:00:00 500 mg via ORAL
  Filled 2018-12-09: qty 1

## 2018-12-09 NOTE — ED Provider Notes (Signed)
[facility] Emergency Department Provider Note   ____________________________________________    I have reviewed the triage vital signs and the nursing notes.   HISTORY  Chief Complaint Altered Mental Status and Abnormal Lab     HPI Laura Wilcox is a 85 y.o. female with a history of mild cognitive impairment who presents with increased fatigue and weakness.  Daughter reports that 4 days ago the patient developed severe fatigue and weakness sleeping most of the day and getting exhausted with any exertion, this is quite atypical for her.  She was seen in the emergency department 2 days ago had overall reassuring work-up, blood culture 1 out of 4 bottles positive thought to be contaminant.  I did discuss with the daughter at that time who said that she was doing about the same and that she would consider bringing her back this morning which she now has.  Patient states she feels well and has no complaints, no rash, no nausea vomiting abdominal pain.  Normal stools.  Daughter reports she had a cough last night  Past Medical History:  Diagnosis Date  . Polycythemia     Patient Active Problem List   Diagnosis Date Noted  . Hyperparathyroidism ([place]) 02/19/2018  . Normocytic anemia 02/18/2018  . Decreased renal function 02/18/2018  . Renal insufficiency 02/18/2018  . Elevated ferritin 02/18/2018  . Goals of care, counseling/discussion 07/09/2017  . Polycythemia rubra vera ([place]) 06/02/2017  . Underweight 06/02/2017  . Urticaria 06/02/2017  . Advanced care planning/counseling discussion 06/02/2017  . Mild cognitive impairment 06/02/2017    Past Surgical History:  Procedure Laterality Date  . ABDOMINAL HYSTERECTOMY     complete  . BREAST SURGERY      Prior to Admission medications   Medication Sig Start Date End Date Taking? Authorizing Provider  calcitRIOL (ROCALTROL) 0.25 MCG capsule Take 0.25 mcg by mouth 3 (three) times a week. 02/11/18    [provider]  cephALEXin (KEFLEX) 500 MG capsule Take 1 capsule (500 mg total) by mouth 2 (two) times daily. 12/09/18   Lavonia Drafts, MD  JAKAFI 15 MG tablet TAKE 1 TABLET (15 MG TOTAL) BY MOUTH 2 TIMES DAILY FOR 30 DAYS. 12/04/18   Lequita Asal, MD     Allergies Patient has no known allergies.  Family History  Problem Relation Age of Onset  . Heart disease Father   . Cancer Sister        breast  . Cancer Sister        breast  . Alzheimer's disease Sister     Social History Social History   Tobacco Use  . Smoking status: Former Smoker    Packs/day: 1.00    Years: 30.00    Pack years: 30.00    Quit date: 04/02/1978    Years since quitting: 40.7  . Smokeless tobacco: Never Used  Substance Use Topics  . Alcohol use: No    Frequency: Never    Comment: occasional glass of wine  . Drug use: No    Review of Systems  Constitutional: Low-grade fever Eyes: No visual changes.  ENT: No sore throat. Cardiovascular: Denies chest pain. Respiratory: Denies shortness of breath.  Cough Gastrointestinal: No abdominal pain.  No nausea, no vomiting.   Genitourinary: Negative for dysuria. Musculoskeletal: Negative for back pain. Skin: Negative for rash. Neurological: Negative for headaches    ____________________________________________   PHYSICAL EXAM:  VITAL SIGNS: ED Triage Vitals  Enc Vitals Group     BP  12/09/18 1037 (!) 105/51     Pulse Rate 12/09/18 1037 80     Resp 12/09/18 1037 16     Temp 12/09/18 1037 98.8 F (37.1 C)     Temp Source 12/09/18 1037 Oral     SpO2 12/09/18 1037 98 %     Weight 12/09/18 1038 49 kg (108 lb 0.4 oz)     Height 12/09/18 1038 1.651 m (5\' 5" )     Head Circumference --      Peak Flow --      Pain Score 12/09/18 1038 0     Pain Loc --      Pain Edu? --      Excl. in [place]? --     Constitutional: Alert but forgetful, overall well-appearing nontoxic Eyes: Conjunctivae are normal.  Head: Atraumatic. Nose: No  congestion/rhinnorhea. Mouth/Throat: Mucous membranes are moist.   Neck:  Painless ROM Cardiovascular: Normal rate, regular rhythm. Grossly normal heart sounds.  Good peripheral circulation. Respiratory: Normal respiratory effort.  No retractions. Lungs CTAB. Gastrointestinal: Soft and nontender. No distention.  No CVA tenderness.  Guaiac negative brown stool Genitourinary: deferred Musculoskeletal: No lower extremity tenderness nor edema.  Warm and well perfused Neurologic:  Normal speech and language. No gross focal neurologic deficits are appreciated.  Skin:  Skin is warm, dry and intact. No rash noted. Psychiatric: Mood and affect are normal. Speech and behavior are normal.  ____________________________________________   LABS (all labs ordered are listed, but only abnormal results are displayed)  Labs Reviewed  COMPREHENSIVE METABOLIC PANEL - Abnormal; Notable for the following components:      Result Value   CO2 21 (*)    Glucose, Bld 111 (*)    Creatinine, Ser 1.22 (*)    Albumin 3.4 (*)    GFR calc non Af Amer 39 (*)    GFR calc Af Amer 45 (*)    All other components within normal limits  CBC - Abnormal; Notable for the following components:   WBC 11.3 (*)    RBC 2.57 (*)    Hemoglobin 7.5 (*)    HCT 24.2 (*)    RDW 22.4 (*)    All other components within normal limits  URINALYSIS, COMPLETE (UACMP) WITH MICROSCOPIC - Abnormal; Notable for the following components:   Color, Urine YELLOW (*)    APPearance CLOUDY (*)    Hgb urine dipstick SMALL (*)    Protein, ur 30 (*)    Nitrite POSITIVE (*)    Leukocytes,Ua TRACE (*)    Bacteria, UA MANY (*)    All other components within normal limits  URINE CULTURE   ____________________________________________  EKG  None ____________________________________________  RADIOLOGY  Chest x-ray negative for pneumonia ____________________________________________   PROCEDURES  Procedure(s) performed: No  Procedures    Critical Care performed: No ____________________________________________   INITIAL IMPRESSION / ASSESSMENT AND PLAN / ED COURSE  Pertinent labs & imaging results that were available during my care of the patient were reviewed by me and considered in my medical decision making (see chart for details).  Patient presents with diffuse weakness/fatigue suspicious for underlying infection, mild drop in hemoglobin, guaiac negative however.  Will check two-view chest x-ray, repeat labs.  Patient is frustrated with [place] and does not want to be here, daughter is working to convince her to stay.  Patient's lab work is overall reassuring, urinalysis suspicious for urinary tract infection that would explain her fatigue, low-grade temperatures.  Patient absolutely refuses to stay in the  hospital and does not want any IV treatment.  We will start p.o. antibiotics discussed this with daughter at length and she is comfortable with this plan    ____________________________________________   FINAL CLINICAL IMPRESSION(S) / ED DIAGNOSES  Final diagnoses:  Lower urinary tract infectious disease  Weakness        Note:  This document was prepared using Dragon voice recognition software and may include unintentional dictation errors.   Lavonia Drafts, MD 12/09/18 1406

## 2018-12-09 NOTE — ED Triage Notes (Signed)
Had positive blood culture.  Sleeping more.  Still low grade fever.  Has been saying sore arms and leg muscles.  Gets fatigued easily.  Increased forgetfulness.

## 2018-12-10 ENCOUNTER — Inpatient Hospital Stay: Payer: Medicare Other

## 2018-12-10 ENCOUNTER — Inpatient Hospital Stay: Payer: Medicare Other | Admitting: Hematology and Oncology

## 2018-12-10 ENCOUNTER — Encounter (HOSPITAL_COMMUNITY): Payer: Self-pay

## 2018-12-10 LAB — CULTURE, BLOOD (ROUTINE X 2)
Special Requests: ADEQUATE
Special Requests: ADEQUATE

## 2018-12-10 MED FILL — JAKAFI 15 MG TABLET: 15 | 30 days supply | Qty: 60 | Fill #0

## 2018-12-12 LAB — URINE CULTURE: Culture: >=100000 — AB

## 2018-12-15 NOTE — Progress Notes (Signed)
[facility]  [address], [address] [place], [place] [address] Phone: [phone]  Fax: [phone]   Clinic Day:  12/16/2018  Referring physician: No ref. provider found  Chief Complaint: Laura Wilcox is a 85 y.o. female with with polycythemia rubra vera (PV) on Jakafi  who is seen for 3 month assessment after interval hospitalization.   HPI: The patient was last seen in the hematology clinic on 09/14/2018. At that time, she was doing well.  She voiced no concerns.  CBC revealed a hematocrit of 32.6, hemoglobin 10.3, MCV 90.1, platelets 383,000, white count 10,000 with an ANC of 5600.  She continued Jakafi 15 mg BID.  Labs during interim: 10/26/2018:  Hematocrit 30.6, hemoglobin 9.6, MCV 93.3, platelets 424,000, white count 10,900 with an ANC of 7000. 11/17/2018:  Hematocrit 28.0, hemoglobin 8.9, MCV 92.7, platelets 331,000, white count 8900. 11/26/2018:  Hematocrit 28.4, hemoglobin 8.9, MCV 93.1, platelets 388,000, white count 10,500 with an ANC of 7000.  Creatinine was 1.54. 12/07/2018:  Hematocrit 28.8, hemoglobin 9.0, MCV 93.2, platelets 403,000, white count 12,200 with an ANC of 9000.  Creatinine was 1.36. 12/09/2018:  Hematocrit 24.2, hemoglobin 7.5, MCV 94.2, platelets 331,000, white count 11,300.  Creatinine was 1.22. 12/16/2018:  Hematocrit 24.2, hemoglobin 7.5, MCV 95.3, platelets 452,000, WBC 12,400. Creatinine was 1.26. LDH 208.   She was seen in the emergency room on 12/08/2018 for altered mental status and abnormal labs.  She noted increasing fatigue and weakness.  She was sleeping most of the day.  She was exhausted on any exertion.  CXR negative for pneumonia.  Urinalysis was suspicious for a UTI.  Urine culture grew > 100,000 colonies of E. coli which was pansensitive.  She was received Keflex 500 mg BID.  Stool was guaiac negative.  During the interim, she has been extremely fatigued.  She can sleep 22 hours/day.  Her daughter notes that she  has been "talking out of her head" at times.   Her daughter is unsure if this is due to the UTI or mental changes. She has had extreme fatigue.  She has difficulty standing for even short periods of time.  She notes that she is too fatigued to brush her hair.  She denies falls.  Her daughter reports low grade fevers.  She feels cold.    Past Medical History:  Diagnosis Date  . Polycythemia     Past Surgical History:  Procedure Laterality Date  . ABDOMINAL HYSTERECTOMY     complete  . BREAST SURGERY      Family History  Problem Relation Age of Onset  . Heart disease Father   . Cancer Sister        breast  . Cancer Sister        breast  . Alzheimer's disease Sister     Social History:  reports that she quit smoking about 40 years ago. She has a 30.00 pack-year smoking history. She has never used smokeless tobacco. She reports that she does not drink alcohol or use drugs. Former smoker (1/2 pack/day) from age 66s to 24s. She quit smoking in 1980. She occasionally drinks a glass of wine. She is widowed. Patient is retired Sport and exercise psychologist. Patient moved to [facility] in 03/2017. She lives independently in her own apartment in [place]. Her daughter lives next door.Patient denies known exposures to radiation on toxins.  Pt's daughter can be reached at [phone]. The patient is accompanied by her daughter via video call today.  Allergies: No Known Allergies  Current Medications: No current facility-administered medications for this visit.    No current outpatient medications on file.   Facility-Administered Medications Ordered in Other Visits  Medication Dose Route Frequency Provider Last Rate Last Dose  . 0.9 %  sodium chloride infusion (Manually program via Guardrails IV Fluids)   Intravenous Once Stutz Basta, MD      . 0.9 %  sodium chloride infusion   Intravenous Once Vanessa Sugarloaf, MD      . docusate sodium (COLACE) capsule 100 mg  100 mg Oral BID PRN Kloss Basta, MD       . heparin injection 5,000 Units  5,000 Units Subcutaneous Q8H Shutter Basta, MD   5,000 Units at 12/18/18 0650  . influenza vaccine adjuvanted (FLUAD) injection 0.5 mL  0.5 mL Intramuscular Tomorrow-1000 Vankleeck Basta, MD      . QUEtiapine (SEROQUEL) tablet 25 mg  25 mg Oral QHS Harrie Foreman, MD      . traZODone (DESYREL) tablet 50 mg  50 mg Oral Once Lance Coon, MD        Review of Systems  Constitutional: Positive for chills, fever (low grade fever 99 - 101), malaise/fatigue (extreme) and weight loss (4 lbs). Negative for diaphoresis.  HENT: Positive for hearing loss. Negative for congestion, ear discharge, nosebleeds, sinus pain and sore throat.   Eyes: Negative.  Negative for blurred vision, double vision, photophobia and pain.  Respiratory: Negative.  Negative for cough, hemoptysis, sputum production and shortness of breath.   Cardiovascular: Negative.  Negative for chest pain, palpitations, orthopnea, leg swelling and PND.  Gastrointestinal: Negative.  Negative for abdominal pain, blood in stool, constipation, diarrhea, heartburn, melena, nausea and vomiting.  Genitourinary: Negative.  Negative for dysuria, frequency and hematuria.  Musculoskeletal: Negative.  Negative for back pain, falls, joint pain, myalgias and neck pain.  Skin: Negative.  Negative for itching and rash.  Neurological: Negative.  Negative for dizziness, tingling, tremors, sensory change, speech change, focal weakness, weakness and headaches.  Endo/Heme/Allergies: Negative.  Does not bruise/bleed easily.  Psychiatric/Behavioral: Positive for memory loss. Negative for depression. The patient is not nervous/anxious and does not have insomnia.   All other systems reviewed and are negative.  Performance status (ECOG): 2  Vitals Blood pressure 125/63, pulse 83, temperature 98.1 F (36.7 C), temperature source Oral, resp. rate 18, weight 108 lb 9.2 oz (49.2 kg), SpO2 96 %.   Physical Exam   Constitutional: She appears well-developed and well-nourished. No distress. Face mask in place.  Thin woman sitting comfortably at home in no acute distress.   HENT:  Head: Normocephalic and atraumatic.  Right Ear: Hearing normal.  Left Ear: Hearing normal.  Nose: Nose normal.  Mouth/Throat: Oropharynx is clear and moist and mucous membranes are normal. No oral lesions.  Short brown/blonde hair.  Eyes: Pupils are equal, round, and reactive to light. Conjunctivae and EOM are normal. No scleral icterus.  Blue eyes.  Neck: No JVD present.  Cardiovascular: Normal rate, regular rhythm and normal heart sounds. Exam reveals no gallop and no friction rub.  No murmur heard. Pulmonary/Chest: Effort normal and breath sounds normal. She has no wheezes. She has no rhonchi. She has no rales.  Abdominal: Soft. Normal appearance and bowel sounds are normal. She exhibits no mass. There is no hepatosplenomegaly. There is no abdominal tenderness. There is no rebound and no guarding.  Musculoskeletal: Normal range of motion.        General: No edema.  Lymphadenopathy:    She  has no cervical adenopathy.       Right cervical: No superficial cervical adenopathy present.   She has no axillary adenopathy.       Right: No inguinal adenopathy present.       Left: No inguinal adenopathy present.  Neurological: She is alert.  Skin: Skin is dry and intact. No bruising, no lesion and no rash noted. She is not diaphoretic. No erythema. No pallor.  Psychiatric: She has a normal mood and affect. Her behavior is normal. Judgment and thought content normal.  Nursing note and vitals reviewed.   Orders Only on 12/16/2018  Component Date Value Ref Range Status  . ABO/RH(D) 12/16/2018 O POS   Final  . Antibody Screen 12/16/2018 NEG   Final  . Sample Expiration 12/16/2018 12/19/2018,2359   Final  . Unit Number 12/16/2018 [id number]   Final  . Blood Component Type 12/16/2018 RBC, LR IRR   Final  . Unit division  12/16/2018 00   Final  . Status of Unit 12/16/2018 REL FROM [facility]   Final  . Unit tag comment 12/16/2018 IRRADIATED PRODUCT   Final  . Transfusion Status 12/16/2018 OK TO TRANSFUSE   Final  . Crossmatch Result 12/16/2018    Final                   Value:COMPATIBLE Performed at [facility], [address], [place], [place] [address]   . Order Confirmation 12/16/2018    Final                   Value:ORDER PROCESSED BY BLOOD BANK Performed at [facility], [address] [place], [place] [address]   . Blood Product Unit Number 12/16/2018 [id number]   Final  . PRODUCT CODE 12/16/2018 [id number]   Final  . Unit Type and Rh 12/16/2018 5100   Final  . Blood Product Expiration Date 12/16/2018 [id number]   Final  Clinical Support on 12/16/2018  Component Date Value Ref Range Status  . Blood Bank Specimen 12/16/2018 SAMPLE AVAILABLE FOR TESTING   Final  . Sample Expiration 12/16/2018    Final                   Value:12/19/2018,2359 Performed at [facility], [address] [place], [place] [address]   . Haptoglobin 12/16/2018 497* 41 - 333 mg/dL Final   Comment: (NOTE) Performed At: [facility] [place], [place] [id number] Rush Farmer MD KG:[id number]   . LDH 12/16/2018 208* 98 - 192 U/L Final   Performed at [facility], [address], [place], [place] [address]  . Retic Ct Pct 12/16/2018 2.0  0.4 - 3.1 % Final  . RBC. 12/16/2018 2.57* 3.87 - 5.11 MIL/uL Final  . Retic Count, Absolute 12/16/2018 52.2  19.0 - 186.0 K/uL Final  . Immature Retic Fract 12/16/2018 29.1* 2.3 - 15.9 % Final   Performed at [facility], [address], [place], [place] [address]  . Sodium 12/16/2018 132* 135 - 145 mmol/L Final  . Potassium 12/16/2018 4.4  3.5 - 5.1 mmol/L Final  . Chloride 12/16/2018 98  98 - 111 mmol/L Final  . CO2 12/16/2018 24  22 - 32 mmol/L Final  . Glucose, Bld 12/16/2018 127* 70 - 99 mg/dL Final  .  BUN 12/16/2018 26* 8 - 23 mg/dL Final  . Creatinine, Ser 12/16/2018 1.26* 0.44 - 1.00 mg/dL Final  . Calcium 12/16/2018 9.0  8.9 - 10.3 mg/dL  Final  . Total Protein 12/16/2018 7.2  6.5 - 8.1 g/dL Final  . Albumin 12/16/2018 3.3* 3.5 - 5.0 g/dL Final  . AST 12/16/2018 18  15 - 41 U/L Final  . ALT 12/16/2018 14  0 - 44 U/L Final  . Alkaline Phosphatase 12/16/2018 66  38 - 126 U/L Final  . Total Bilirubin 12/16/2018 0.5  0.3 - 1.2 mg/dL Final  . GFR calc non Af Amer 12/16/2018 37* >60 mL/min Final  . GFR calc Af Amer 12/16/2018 43* >60 mL/min Final  . Anion gap 12/16/2018 10  5 - 15 Final   Performed at [facility], [address] [place], [place] [address]  . WBC 12/16/2018 12.4* 4.0 - 10.5 K/uL Final  . RBC 12/16/2018 2.54* 3.87 - 5.11 MIL/uL Final  . Hemoglobin 12/16/2018 7.5* 12.0 - 15.0 g/dL Final  . HCT 12/16/2018 24.2* 36.0 - 46.0 % Final  . MCV 12/16/2018 95.3  80.0 - 100.0 fL Final  . MCH 12/16/2018 29.5  26.0 - 34.0 pg Final  . MCHC 12/16/2018 31.0  30.0 - 36.0 g/dL Final  . RDW 12/16/2018 21.3* 11.5 - 15.5 % Final  . Platelets 12/16/2018 452* 150 - 400 K/uL Final  . nRBC 12/16/2018 0.2  0.0 - 0.2 % Final  . Neutrophils Relative % 12/16/2018 70  % Final  . Neutro Abs 12/16/2018 8.8* 1.7 - 7.7 K/uL Final  . Lymphocytes Relative 12/16/2018 7  % Final  . Lymphs Abs 12/16/2018 0.8  0.7 - 4.0 K/uL Final  . Monocytes Relative 12/16/2018 8  % Final  . Monocytes Absolute 12/16/2018 1.0  0.1 - 1.0 K/uL Final  . Eosinophils Relative 12/16/2018 4  % Final  . Eosinophils Absolute 12/16/2018 0.5  0.0 - 0.5 K/uL Final  . Basophils Relative 12/16/2018 1  % Final  . Basophils Absolute 12/16/2018 0.1  0.0 - 0.1 K/uL Final  . WBC Morphology 12/16/2018 SMUDGE CELLS   Final  . Smear Review 12/16/2018 Normal platelet morphology   Final  . Immature Granulocytes 12/16/2018 10  % Final  . Abs Immature Granulocytes 12/16/2018 1.19* 0.00 - 0.07 K/uL Final  . Spherocytes 12/16/2018  PRESENT   Final   Performed at [facility], [address], [place], [place] [address]  . Path Review 12/16/2018 Peripheral blood smear is reviewed.   Final   Comment: Patient with recent UTI. Saw hematologist in clinic for follow up of polycythemia vera. On Jakafi. Leukocytosis, likely secondary to recent UTI infection. RBC with mild anisocytosis. Thrombocytosis, likely reactive. Unremarkable platelet morphology.  Reviewed by Dellia Nims Reuel Derby, M.D. Performed at [facility], [address], [place], [place] [address]   . Specimen Description 12/16/2018    Final                   Value:URINE, CLEAN CATCH Performed at [facility], [address] [place], [place] [address]   . Special Requests 12/16/2018    Final                   Value:NONE Performed at [facility], [address], [place], [place] [address]   . Culture 12/16/2018    Final                   Value:NO GROWTH Performed at [facility], [place] [address], [place], [place] [address]   . Report Status 12/16/2018 12/17/2018 FINAL   Final  .  Color, Urine 12/16/2018 YELLOW  YELLOW Final  . APPearance 12/16/2018 CLEAR  CLEAR Final  . Specific Gravity, Urine 12/16/2018 <1.005* 1.005 - 1.030 Final  . pH 12/16/2018 5.5  5.0 - 8.0 Final  . Glucose, UA 12/16/2018 NEGATIVE  NEGATIVE mg/dL Final  . Hgb urine dipstick 12/16/2018 NEGATIVE  NEGATIVE Final  . Bilirubin Urine 12/16/2018 NEGATIVE  NEGATIVE Final  . Ketones, ur 12/16/2018 NEGATIVE  NEGATIVE mg/dL Final  . Protein, ur 12/16/2018 NEGATIVE  NEGATIVE mg/dL Final  . Nitrite 12/16/2018 NEGATIVE  NEGATIVE Final  . Chalmers Guest 12/16/2018 NEGATIVE  NEGATIVE Final  . Squamous Epithelial / LPF 12/16/2018 6-10  0 - 5 Final  . WBC, UA 12/16/2018 6-10  0 - 5 WBC/hpf Final  . RBC / HPF 12/16/2018 0-5  0 - 5 RBC/hpf Final  . Bacteria, UA 12/16/2018 RARE* NONE SEEN Final   Performed at [facility], [address], [place], [place] [address]  . Specimen Description 12/16/2018    Final                   Value:BLOOD RIGHT ANTECUBITAL Performed at [facility], [address], [place], [place] [address]   . Special Requests 12/16/2018    Final                   Value:BOTTLES DRAWN AEROBIC AND ANAEROBIC Blood Culture adequate volume Performed at [facility], [address], [place], [place] [address]   . Culture 12/16/2018    Final                   Value:NO GROWTH < 24 HOURS Performed at [facility], [address] [place], [place] [address]   . Report Status 12/16/2018 PENDING   Incomplete  . Specimen Description 12/16/2018    Final                   Value:BLOOD BLOOD RIGHT HAND Performed at [facility], [address], [place], [place] [address]   . Special Requests 12/16/2018    Final                   Value:BOTTLES DRAWN AEROBIC AND ANAEROBIC Blood Culture adequate volume Performed at [facility], [address], [place], [place] [address]   . Culture 12/16/2018    Final                   Value:NO GROWTH < 24 HOURS Performed at [facility], [address] [place], [place] [address]   . Report Status 12/16/2018 PENDING   Incomplete     Assessment:  Laura Wilcox is a 85 y.o. female with polycythemia rubra veraand secondary myelofibrosis. She presented in 04/2002 with thrombocytosis (835,000). JAK2 V617Fwas positive on 05/20/2012.  Over the past year,hematocrithas ranged 37.5 - 42.4,hemoglobin12.1 - 13.4, platelets458,000 - 569,000, WBC10,500 - 15,700. Creatinine has been 1.0 - 1.2.  Bone marrowon 04/15/2002 revealed a normocellular marrow for age with trilineage hematopoiesis and mild megakaryocytic hyperplasia with focal atypia. There was no morphologic evidence of a myeloproliferative disorder. Iron stains were inadequate for evaluation of storage iron. There was no increase in reticulin fibers.  She has been on agrylin since 2012.  Bone marrowon 09/25/2012 revealed a persistent myeloproliferative disorder s/p Agrylin. The current features were best classified as persistent myeloproliferative neoplasm (MPN) JAK2 V617F mutation positive. Marrow was hypercellular for age (30-50%) with increased  trilineage hematopoiesis and markedly increased atypical megakaryopoiesis with frequent clustering. There were no increased blasts. Iron stores were absent. There was moderate to severe myelofibrosis(grade MF 2-3). Differential included ET, PV, primary myelofibrosis, and MPN, unclassifiable. Cytogeneticswere normal (30, XX).  She developed pancytopeniain 12/2015. Etiology is unclear (medication confusion or hydroxyurea + Agrylin).  She received Procrit40,000 units (intermittently 09/2012 - 06/2016), Venofer(09/2012 - 10/2012), and Infed(12/2015 - 03/2016). She received Neupogenduring a period of neutropenia. She was anagrelidefrom 2004 - 08/13/2017. She beganJakafion 08/14/2017. Jakafi was increased from 5 mg BID to 10 mg BID on03/18/2020.Jakafi was increased to 15 mg BID on 08/03/2018.  She began a phlebotomy programon 08/06/2017. She undergoes small volume (150 cc with replacement of 150 cc NS)if her HCT >42.  Ferritinhas been followed: 529 on 09/03/2017, 1020 on 12/03/2017, 423 on 02/18/2018, and 858 on 11/17/2018.  Iron saturation was 37% and TIBC 270 on 11/17/2018.  B12 was 477 and folate 36 on 11/17/2018.  Symptomatically, she has been extremely fatigued.  She completed a course of Keflex for an E coli UTI 1 week ago.  Exam is non-focal.  Plan: 1.   Labs today:  CBC with diff, CMP, LDH, haptoglobin, retic, hold tube. 2.   Polycythemia rubra vera Hematocrit 24.2.  Hemoglobin7.5.  Platelets 452,000. Etiology felt secondary to [facility] given prior history. Hold Jakafi. Discuss plan for 1-2 units PRBCs.  Patient and daughter agreeable. 3.   Low grade fever and  confusion  Patient with recent E coli UTI.   Clinically, patient appears to be at baseline.   Fever work-up:    UA and culture.    Blood cultures x 2.    CXR.  4.   Renal insufficiency BUN26and creatinine 1.26. Encourage hydration.   Continue to monitor.  5.   Hyperparathyroidism Calcium 8.9. Continue to monitor. 6.   RTC tomorrow for 1 unit PRBCs 7.   RTC on 12/18/2018 for NP assessment, review of cultures, and +/- 1 unit PRBCs. 8.   RTC in 1 week for MD assessment, labs (CBC with diff, CMP, hold tube).  I discussed the assessment and treatment plan with the patient.  The patient was provided an opportunity to ask questions and all were answered.  The patient agreed with the plan and demonstrated an understanding of the instructions.  The patient was advised to call back if the symptoms worsen or if the condition fails to improve as anticipated.  I provided 25 minutes of face-to-face time during this this encounter and > 50% was spent counseling as documented under my assessment and plan.    Lequita Asal, MD, PhD    12/16/2018, 4:45 PM  I, Jacqualyn Posey, am acting as Education administrator for [employer]. Mike Gip, MD, PhD.  I,  C. Mike Gip, MD, have reviewed the above documentation for accuracy and completeness, and I agree with the above.

## 2018-12-16 ENCOUNTER — Other Ambulatory Visit: Payer: Self-pay

## 2018-12-16 ENCOUNTER — Encounter: Payer: Self-pay | Admitting: Hematology and Oncology

## 2018-12-16 ENCOUNTER — Inpatient Hospital Stay: Payer: Medicare Other | Attending: Hematology and Oncology

## 2018-12-16 ENCOUNTER — Inpatient Hospital Stay (HOSPITAL_BASED_OUTPATIENT_CLINIC_OR_DEPARTMENT_OTHER): Payer: Medicare Other | Admitting: Hematology and Oncology

## 2018-12-16 ENCOUNTER — Other Ambulatory Visit: Payer: Self-pay | Admitting: Hematology and Oncology

## 2018-12-16 VITALS — BP 125/63 | HR 83 | Temp 98.1°F | Resp 18 | Wt 108.6 lb

## 2018-12-16 DIAGNOSIS — E213 Hyperparathyroidism, unspecified: Secondary | ICD-10-CM | POA: Insufficient documentation

## 2018-12-16 DIAGNOSIS — R5383 Other fatigue: Secondary | ICD-10-CM | POA: Insufficient documentation

## 2018-12-16 DIAGNOSIS — Z8249 Family history of ischemic heart disease and other diseases of the circulatory system: Secondary | ICD-10-CM | POA: Diagnosis not present

## 2018-12-16 DIAGNOSIS — R509 Fever, unspecified: Secondary | ICD-10-CM | POA: Diagnosis not present

## 2018-12-16 DIAGNOSIS — D649 Anemia, unspecified: Secondary | ICD-10-CM

## 2018-12-16 DIAGNOSIS — Z7189 Other specified counseling: Secondary | ICD-10-CM

## 2018-12-16 DIAGNOSIS — D7581 Myelofibrosis: Secondary | ICD-10-CM | POA: Diagnosis not present

## 2018-12-16 DIAGNOSIS — Z87440 Personal history of urinary (tract) infections: Secondary | ICD-10-CM | POA: Diagnosis not present

## 2018-12-16 DIAGNOSIS — N289 Disorder of kidney and ureter, unspecified: Secondary | ICD-10-CM | POA: Diagnosis not present

## 2018-12-16 DIAGNOSIS — D45 Polycythemia vera: Secondary | ICD-10-CM

## 2018-12-16 DIAGNOSIS — Z79899 Other long term (current) drug therapy: Secondary | ICD-10-CM | POA: Insufficient documentation

## 2018-12-16 DIAGNOSIS — R410 Disorientation, unspecified: Secondary | ICD-10-CM | POA: Diagnosis not present

## 2018-12-16 DIAGNOSIS — Z803 Family history of malignant neoplasm of breast: Secondary | ICD-10-CM | POA: Diagnosis not present

## 2018-12-16 DIAGNOSIS — Z87891 Personal history of nicotine dependence: Secondary | ICD-10-CM | POA: Insufficient documentation

## 2018-12-16 DIAGNOSIS — R531 Weakness: Secondary | ICD-10-CM | POA: Insufficient documentation

## 2018-12-16 DIAGNOSIS — Z820 Family history of epilepsy and other diseases of the nervous system: Secondary | ICD-10-CM | POA: Insufficient documentation

## 2018-12-16 LAB — URINALYSIS, COMPLETE (UACMP) WITH MICROSCOPIC
Bilirubin Urine: NEGATIVE
Glucose, UA: NEGATIVE mg/dL
Hgb urine dipstick: NEGATIVE
Ketones, ur: NEGATIVE mg/dL
Leukocytes,Ua: NEGATIVE
Nitrite: NEGATIVE
Protein, ur: NEGATIVE mg/dL
Specific Gravity, Urine: <1.005 — ABNORMAL LOW (ref 1.005–1.030)
pH: 5.5 (ref 5.0–8.0)

## 2018-12-16 LAB — CBC WITH DIFFERENTIAL/PLATELET
Abs Immature Granulocytes: 1.19 10*3/uL — ABNORMAL HIGH (ref 0.00–0.07)
Basophils Absolute: 0.1 10*3/uL (ref 0.0–0.1)
Basophils Relative: 1 %
Eosinophils Absolute: 0.5 10*3/uL (ref 0.0–0.5)
Eosinophils Relative: 4 %
HCT: 24.2 % — ABNORMAL LOW (ref 36.0–46.0)
Hemoglobin: 7.5 g/dL — ABNORMAL LOW (ref 12.0–15.0)
Immature Granulocytes: 10 %
Lymphocytes Relative: 7 %
Lymphs Abs: 0.8 10*3/uL (ref 0.7–4.0)
MCH: 29.5 pg (ref 26.0–34.0)
MCHC: 31 g/dL (ref 30.0–36.0)
MCV: 95.3 fL (ref 80.0–100.0)
Monocytes Absolute: 1 10*3/uL (ref 0.1–1.0)
Monocytes Relative: 8 %
Neutro Abs: 8.8 10*3/uL — ABNORMAL HIGH (ref 1.7–7.7)
Neutrophils Relative %: 70 %
Platelets: 452 10*3/uL — ABNORMAL HIGH (ref 150–400)
RBC: 2.54 MIL/uL — ABNORMAL LOW (ref 3.87–5.11)
RDW: 21.3 % — ABNORMAL HIGH (ref 11.5–15.5)
Smear Review: NORMAL
WBC: 12.4 10*3/uL — ABNORMAL HIGH (ref 4.0–10.5)
nRBC: 0.2 % (ref 0.0–0.2)

## 2018-12-16 LAB — COMPREHENSIVE METABOLIC PANEL
ALT: 14 U/L (ref 0–44)
AST: 18 U/L (ref 15–41)
Albumin: 3.3 g/dL — ABNORMAL LOW (ref 3.5–5.0)
Alkaline Phosphatase: 66 U/L (ref 38–126)
Anion gap: 10 (ref 5–15)
BUN: 26 mg/dL — ABNORMAL HIGH (ref 8–23)
CO2: 24 mmol/L (ref 22–32)
Calcium: 9 mg/dL (ref 8.9–10.3)
Chloride: 98 mmol/L (ref 98–111)
Creatinine, Ser: 1.26 mg/dL — ABNORMAL HIGH (ref 0.44–1.00)
GFR calc Af Amer: 43 mL/min — ABNORMAL LOW (ref >60)
GFR calc non Af Amer: 37 mL/min — ABNORMAL LOW (ref >60)
Glucose, Bld: 127 mg/dL — ABNORMAL HIGH (ref 70–99)
Potassium: 4.4 mmol/L (ref 3.5–5.1)
Sodium: 132 mmol/L — ABNORMAL LOW (ref 135–145)
Total Bilirubin: 0.5 mg/dL (ref 0.3–1.2)
Total Protein: 7.2 g/dL (ref 6.5–8.1)

## 2018-12-16 LAB — LACTATE DEHYDROGENASE: LDH: 208 U/L — ABNORMAL HIGH (ref 98–192)

## 2018-12-16 LAB — SAMPLE TO BLOOD BANK

## 2018-12-16 LAB — RETICULOCYTES
Immature Retic Fract: 29.1 % — ABNORMAL HIGH (ref 2.3–15.9)
RBC.: 2.57 MIL/uL — ABNORMAL LOW (ref 3.87–5.11)
Retic Count, Absolute: 52.2 10*3/uL (ref 19.0–186.0)
Retic Ct Pct: 2 % (ref 0.4–3.1)

## 2018-12-16 LAB — PATHOLOGIST SMEAR REVIEW

## 2018-12-16 NOTE — Progress Notes (Signed)
Patient here for follow up. Patient complained of "legs feeling tired" starting today. Denies any other concerns.

## 2018-12-17 ENCOUNTER — Inpatient Hospital Stay: Payer: Medicare Other

## 2018-12-17 ENCOUNTER — Emergency Department: Payer: Medicare Other

## 2018-12-17 ENCOUNTER — Other Ambulatory Visit: Payer: Self-pay

## 2018-12-17 ENCOUNTER — Observation Stay
Admission: EM | Admit: 2018-12-17 | Discharge: 2018-12-18 | Disposition: A | Discharge status: Home / Self Care | Payer: Medicare Other | Attending: Internal Medicine | Admitting: Internal Medicine

## 2018-12-17 DIAGNOSIS — I6782 Cerebral ischemia: Secondary | ICD-10-CM | POA: Diagnosis not present

## 2018-12-17 DIAGNOSIS — Z20828 Contact with and (suspected) exposure to other viral communicable diseases: Secondary | ICD-10-CM | POA: Diagnosis not present

## 2018-12-17 DIAGNOSIS — R4182 Altered mental status, unspecified: Secondary | ICD-10-CM | POA: Diagnosis not present

## 2018-12-17 DIAGNOSIS — R2681 Unsteadiness on feet: Secondary | ICD-10-CM | POA: Diagnosis not present

## 2018-12-17 DIAGNOSIS — R531 Weakness: Secondary | ICD-10-CM

## 2018-12-17 DIAGNOSIS — Z03818 Encounter for observation for suspected exposure to other biological agents ruled out: Secondary | ICD-10-CM | POA: Diagnosis not present

## 2018-12-17 DIAGNOSIS — Z79899 Other long term (current) drug therapy: Secondary | ICD-10-CM | POA: Insufficient documentation

## 2018-12-17 DIAGNOSIS — Z87440 Personal history of urinary (tract) infections: Secondary | ICD-10-CM | POA: Diagnosis not present

## 2018-12-17 DIAGNOSIS — D649 Anemia, unspecified: Secondary | ICD-10-CM | POA: Diagnosis not present

## 2018-12-17 DIAGNOSIS — Z87891 Personal history of nicotine dependence: Secondary | ICD-10-CM | POA: Insufficient documentation

## 2018-12-17 DIAGNOSIS — N183 Chronic kidney disease, stage 3 (moderate): Secondary | ICD-10-CM | POA: Insufficient documentation

## 2018-12-17 DIAGNOSIS — D45 Polycythemia vera: Secondary | ICD-10-CM | POA: Diagnosis not present

## 2018-12-17 DIAGNOSIS — E8771 Transfusion associated circulatory overload: Secondary | ICD-10-CM | POA: Diagnosis not present

## 2018-12-17 LAB — BASIC METABOLIC PANEL
Anion gap: 10 (ref 5–15)
BUN: 26 mg/dL — ABNORMAL HIGH (ref 8–23)
CO2: 26 mmol/L (ref 22–32)
Calcium: 9.4 mg/dL (ref 8.9–10.3)
Chloride: 97 mmol/L — ABNORMAL LOW (ref 98–111)
Creatinine, Ser: 1.23 mg/dL — ABNORMAL HIGH (ref 0.44–1.00)
GFR calc Af Amer: 45 mL/min — ABNORMAL LOW (ref >60)
GFR calc non Af Amer: 39 mL/min — ABNORMAL LOW (ref >60)
Glucose, Bld: 102 mg/dL — ABNORMAL HIGH (ref 70–99)
Potassium: 4.9 mmol/L (ref 3.5–5.1)
Sodium: 133 mmol/L — ABNORMAL LOW (ref 135–145)

## 2018-12-17 LAB — HAPTOGLOBIN: Haptoglobin: 497 mg/dL — ABNORMAL HIGH (ref 41–333)

## 2018-12-17 LAB — CBC WITH DIFFERENTIAL/PLATELET
Abs Immature Granulocytes: 0.4 10*3/uL — ABNORMAL HIGH (ref 0.00–0.07)
Band Neutrophils: 3 %
Basophils Absolute: 0.2 10*3/uL — ABNORMAL HIGH (ref 0.0–0.1)
Basophils Relative: 2 %
Eosinophils Absolute: 0.4 10*3/uL (ref 0.0–0.5)
Eosinophils Relative: 3 %
HCT: 25 % — ABNORMAL LOW (ref 36.0–46.0)
Hemoglobin: 7.7 g/dL — ABNORMAL LOW (ref 12.0–15.0)
Lymphocytes Relative: 9 %
Lymphs Abs: 1.1 10*3/uL (ref 0.7–4.0)
MCH: 29.2 pg (ref 26.0–34.0)
MCHC: 30.8 g/dL (ref 30.0–36.0)
MCV: 94.7 fL (ref 80.0–100.0)
Metamyelocytes Relative: 2 %
Monocytes Absolute: 0.6 10*3/uL (ref 0.1–1.0)
Monocytes Relative: 5 %
Myelocytes: 1 %
Neutro Abs: 9.5 10*3/uL — ABNORMAL HIGH (ref 1.7–7.7)
Neutrophils Relative %: 75 %
Platelets: 456 10*3/uL — ABNORMAL HIGH (ref 150–400)
RBC: 2.64 MIL/uL — ABNORMAL LOW (ref 3.87–5.11)
RDW: 22 % — ABNORMAL HIGH (ref 11.5–15.5)
Smear Review: NORMAL
WBC: 12.2 10*3/uL — ABNORMAL HIGH (ref 4.0–10.5)
nRBC: 0.5 % — ABNORMAL HIGH (ref 0.0–0.2)

## 2018-12-17 LAB — HEMOGLOBIN: Hemoglobin: 9.2 g/dL — ABNORMAL LOW (ref 12.0–15.0)

## 2018-12-17 LAB — PREPARE RBC (CROSSMATCH)

## 2018-12-17 LAB — URINE CULTURE: Culture: NO GROWTH

## 2018-12-17 MED ORDER — SODIUM CHLORIDE 0.9 % IV SOLN: INTRAVENOUS | Status: DC

## 2018-12-17 MED ORDER — HEPARIN SODIUM (PORCINE) 5000 UNIT/ML IJ SOLN
5000.0000 [IU] | Freq: Three times a day (TID) | INTRAMUSCULAR | Status: DC
  Administered 2018-12-17 – 2018-12-18 (×2): 5000 [IU] via SUBCUTANEOUS
  Filled 2018-12-17 (×2): qty 1

## 2018-12-17 MED ORDER — ACETAMINOPHEN 325 MG PO TABS: 650.0000 mg | ORAL_TABLET | Freq: Once | ORAL | Status: DC

## 2018-12-17 MED ORDER — HYDROXYZINE HCL 25 MG PO TABS
25.0000 mg | ORAL_TABLET | Freq: Once | ORAL | Status: AC
  Administered 2018-12-17: 25 mg via ORAL
  Filled 2018-12-17: qty 1

## 2018-12-17 MED ORDER — SODIUM CHLORIDE 0.9% IV SOLUTION
250.0000 mL | Freq: Once | INTRAVENOUS | Status: DC
  Filled 2018-12-17: qty 250

## 2018-12-17 MED ORDER — SODIUM CHLORIDE 0.9% IV SOLUTION
Freq: Once | INTRAVENOUS | Status: DC
  Filled 2018-12-17: qty 250

## 2018-12-17 MED ORDER — INFLUENZA VAC A&B SA ADJ QUAD 0.5 ML IM PRSY
0.5000 mL | PREFILLED_SYRINGE | INTRAMUSCULAR | Status: DC
  Filled 2018-12-17: qty 0.5

## 2018-12-17 MED ORDER — DOCUSATE SODIUM 100 MG PO CAPS: 100.0000 mg | ORAL_CAPSULE | Freq: Two times a day (BID) | ORAL | Status: DC | PRN

## 2018-12-17 MED ORDER — SODIUM CHLORIDE 0.9 % IV SOLN: Freq: Once | INTRAVENOUS | Status: DC

## 2018-12-17 MED ORDER — TRAZODONE HCL 50 MG PO TABS
50.0000 mg | ORAL_TABLET | Freq: Once | ORAL | Status: DC
  Filled 2018-12-17: qty 1

## 2018-12-17 NOTE — ED Notes (Signed)
1 st unit prbc complete.  No reaction noted   Pt waiting on admission.

## 2018-12-17 NOTE — ED Triage Notes (Signed)
Reports she went to [facility] today to get blood transfusion, pt took off blood band at home prior to so they were unable to give the blood transfusion. No active bleeding.

## 2018-12-17 NOTE — ED Notes (Signed)
ED TO INPATIENT HANDOFF REPORT  ED Nurse Name and Phone #: Arnika Larzelere  S Name/Age/Gender Laura Wilcox 85 y.o. female Room/Bed: ED02A/ED02A  Code Status   Code Status: Not on file  Home/SNF/Other Home Patient oriented to: self, place, time and situation Is this baseline? Yes   Triage Complete: Triage complete  Chief Complaint blood transfusion  Triage Note Reports she went to [facility] today to get blood transfusion, pt took off blood band at home prior to so they were unable to give the blood transfusion. No active bleeding.   Allergies No Known Allergies  Level of Care/Admitting Diagnosis ED Disposition    ED Disposition Condition [facility] Area: [place] [100120]  Level of Care: Med-Surg [16]  Covid Evaluation: Asymptomatic Screening Protocol (No Symptoms)  Diagnosis: Symptomatic anemia [0630160]  Admitting Physician: Satterfield Basta [[id number]]  Attending Physician: Anstead Basta [phone]  PT Class (Do Not Modify): Observation [104]  PT Acc Code (Do Not Modify): Observation [10022]       B Medical/Surgery History Past Medical History:  Diagnosis Date  . Polycythemia    Past Surgical History:  Procedure Laterality Date  . ABDOMINAL HYSTERECTOMY     complete  . BREAST SURGERY       A IV Location/Drains/Wounds Patient Lines/Drains/Airways Status   Active Line/Drains/Airways    Name:   Placement date:   Placement time:   Site:   Days:   Peripheral IV 12/17/18 Left Antecubital   12/17/18    1509    Antecubital   less than 1          Intake/Output Last 24 hours  Intake/Output Summary (Last 24 hours) at 12/17/2018 1940 Last data filed at 12/17/2018 1801 Gross per 24 hour  Intake 400 ml  Output -  Net 400 ml    Labs/Imaging Results for orders placed or performed during the hospital encounter of 12/17/18 (from the past 48 hour(s))  Basic metabolic panel     Status: Abnormal   Collection  Time: 12/17/18  2:58 PM  Result Value Ref Range   Sodium 133 (L) 135 - 145 mmol/L   Potassium 4.9 3.5 - 5.1 mmol/L   Chloride 97 (L) 98 - 111 mmol/L   CO2 26 22 - 32 mmol/L   Glucose, Bld 102 (H) 70 - 99 mg/dL   BUN 26 (H) 8 - 23 mg/dL   Creatinine, Ser 1.23 (H) 0.44 - 1.00 mg/dL   Calcium 9.4 8.9 - 10.3 mg/dL   GFR calc non Af Amer 39 (L) >60 mL/min   GFR calc Af Amer 45 (L) >60 mL/min   Anion gap 10 5 - 15    Comment: Performed at [facility], [address] [place], [place] [address]  CBC with Differential     Status: Abnormal   Collection Time: 12/17/18  2:58 PM  Result Value Ref Range   WBC 12.2 (H) 4.0 - 10.5 K/uL   RBC 2.64 (L) 3.87 - 5.11 MIL/uL   Hemoglobin 7.7 (L) 12.0 - 15.0 g/dL   HCT 25.0 (L) 36.0 - 46.0 %   MCV 94.7 80.0 - 100.0 fL   MCH 29.2 26.0 - 34.0 pg   MCHC 30.8 30.0 - 36.0 g/dL   RDW 22.0 (H) 11.5 - 15.5 %   Platelets 456 (H) 150 - 400 K/uL   nRBC 0.5 (H) 0.0 - 0.2 %   Neutrophils Relative % 75 %   Neutro Abs 9.5 (H) 1.7 -  7.7 K/uL   Band Neutrophils 3 %   Lymphocytes Relative 9 %   Lymphs Abs 1.1 0.7 - 4.0 K/uL   Monocytes Relative 5 %   Monocytes Absolute 0.6 0.1 - 1.0 K/uL   Eosinophils Relative 3 %   Eosinophils Absolute 0.4 0.0 - 0.5 K/uL   Basophils Relative 2 %   Basophils Absolute 0.2 (H) 0.0 - 0.1 K/uL   RBC Morphology MIXED RBC POPULATION    Smear Review Normal platelet morphology    Metamyelocytes Relative 2 %   Myelocytes 1 %   Abs Immature Granulocytes 0.40 (H) 0.00 - 0.07 K/uL   Ovalocytes PRESENT     Comment: Performed at [facility], [address] [place], [place] [address]  Type and screen [place]     Status: None (Preliminary result)   Collection Time: 12/17/18  2:59 PM  Result Value Ref Range   ABO/RH(D) O POS    Antibody Screen NEG    Sample Expiration 12/20/2018,2359    Unit Number [id number]    Blood Component Type RBC, LR IRR    Unit division 00    Status of Unit  ISSUED    Transfusion Status OK TO TRANSFUSE    Crossmatch Result      Compatible Performed at [facility], [address] [place], [place] [address]   Prepare RBC     Status: None   Collection Time: 12/17/18  5:28 PM  Result Value Ref Range   Order Confirmation      ORDER PROCESSED BY BLOOD BANK Performed at [facility], [address] [place], [place] [address]    Dg Chest 2 View  Result Date: 12/17/2018 CLINICAL DATA:  Reports she went to [facility] today to get blood transfusion, pt took off blood band at home prior to so they were unable to give the blood transfusion. No active bleeding.Hx of breast surgery. Former smoker. EXAM: CHEST - 2 VIEW COMPARISON:  12/09/2018 FINDINGS: Cardiac silhouette is normal in size. No mediastinal or hilar masses. No evidence of adenopathy. Clear lungs.  No pleural effusion or pneumothorax. Skeletal structures are demineralized but intact. IMPRESSION: No active cardiopulmonary disease. Electronically Signed   By: Lajean Manes M.D.   On: 12/17/2018 18:09   Ct Head Wo Contrast  Result Date: 12/17/2018 CLINICAL DATA:  Unable to obtain much hx from pt, was sent to the ER for a blood transfusion. Per MD notes CT scan is for AMS. EXAM: CT HEAD WITHOUT CONTRAST TECHNIQUE: Contiguous axial images were obtained from the base of the skull through the vertex without intravenous contrast. COMPARISON:  None. FINDINGS: Brain: The ventricles are normal in size, for the patient's age, and normal in configuration. There are no parenchymal masses or mass effect. There is no evidence of an infarct. There are no extra-axial masses or abnormal fluid collections. No intracranial hemorrhage. Mild periventricular white matter hypoattenuation is noted consistent with chronic microvascular ischemic change. Vascular: No hyperdense vessel or unexpected calcification. Skull: Normal. Negative for fracture or focal lesion. Sinuses/Orbits: Globes and orbits are  unremarkable. Visualized sinuses and mastoid air cells are clear. Other: None. IMPRESSION: 1. No acute intracranial abnormalities. 2. Mild chronic microvascular ischemic change. Mild, age-appropriate, volume loss. Electronically Signed   By: Lajean Manes M.D.   On: 12/17/2018 17:59    Pending Labs Unresulted Labs (From admission, onward)    Start     Ordered   12/17/18 1933  Prepare RBC  (Adult  Blood Administration - Red Blood Cells)  Once,   R    Question Answer Comment  # of Units 1 unit   Transfusion Indications Symptomatic Anemia   If emergent release call blood bank Not emergent release   Instructions: Transfuse      12/17/18 1932   12/17/18 1826  SARS CORONAVIRUS 2 (TAT 6-24 HRS) Nasopharyngeal Nasopharyngeal Swab  (Asymptomatic/Tier 2 Patients Labs)  Once,   STAT    Question Answer Comment  Is this test for diagnosis or screening Screening   Symptomatic for COVID-19 as defined by CDC No   Hospitalized for COVID-19 No   Admitted to ICU for COVID-19 No   Previously tested for COVID-19 Yes   Resident in a congregate (group) care setting No   Employed in healthcare setting No   Pregnant No      12/17/18 1825   Signed and Held  Basic metabolic panel  Tomorrow morning,   R     Signed and Held   Signed and Held  CBC  Tomorrow morning,   R     Signed and Held   Signed and Held  CBC  (heparin)  Once,   R    Comments: Baseline for heparin therapy IF NOT ALREADY DRAWN.  Notify MD if PLT < 100 K.    Signed and Held   Signed and Held  Creatinine, serum  (heparin)  Once,   R    Comments: Baseline for heparin therapy IF NOT ALREADY DRAWN.    Signed and Held          Vitals/Pain Today's Vitals   12/17/18 1803 12/17/18 1811 12/17/18 1821 12/17/18 1900  BP:    (!) 167/75  Pulse: 88 87 79 89  Resp:   20   Temp:   98.3 F (36.8 C)   TempSrc:   Oral   SpO2: 100% 99% 100% 100%  Weight:      Height:      PainSc:        Isolation Precautions No active  isolations  Medications Medications  0.9 %  sodium chloride infusion (has no administration in time range)  hydrOXYzine (ATARAX/VISTARIL) tablet 25 mg (has no administration in time range)  0.9 %  sodium chloride infusion (Manually program via Guardrails IV Fluids) (has no administration in time range)    Mobility walks with device Moderate fall risk   Focused Assessments Cardiac Assessment Handoff:    Lab Results  Component Value Date   TROPONINI <0.03 09/28/2017   No results found for: DDIMER Does the Patient currently have chest pain? No     R Recommendations: See Admitting Provider Note  Report given to:   Additional Notes: none

## 2018-12-17 NOTE — ED Provider Notes (Addendum)
[facility] Emergency Department Provider Note  ____________________________________________   First MD Initiated Contact with Patient 12/17/18 1706     (approximate)  I have reviewed the triage vital signs and the nursing notes.   HISTORY  Chief Complaint Blood Transfusion    HPI Laura Wilcox is a 85 y.o. female with polycythemia who presents with low blood levels.  Patient has not been doing well for the past 10 days.  Patient was seen by myself at the beginning of the month with some intermittent fevers.  Work-up was negative.  Patient came back 2 days later and was positive for UTI at that time.  Patient started on Keflex.  Patient had a repeat urine culture and blood cultures done yesterday as well.  However according to the daughter she is continued to have increasing agitation and will occasionally just lay in bed saying that she wants to die and just not acting her normal self.  She was seen by oncology yesterday for a low hemoglobin.  She had a negative Hemoccult recently.  We were going to transfuse her 1 unit of packed red blood cells.  She had accidently thrown away the wristband for the blood transfusion for today so when she came here the nurse in the oncology unit recommended she come to the ER to be admitted for the above concerns as well as to get a blood transfusion.  She is continued to have some low-grade temperatures at home with increased lethargy that is been going on for 1 week, constant, nothing makes it better, nothing makes it worse.    Past Medical History:  Diagnosis Date   Polycythemia     Patient Active Problem List   Diagnosis Date Noted   Hyperparathyroidism ([place]) 02/19/2018   Normocytic anemia 02/18/2018   Decreased renal function 02/18/2018   Renal insufficiency 02/18/2018   Elevated ferritin 02/18/2018   Goals of care, counseling/discussion 07/09/2017   Polycythemia rubra vera ([place]) 06/02/2017   Underweight  06/02/2017   Urticaria 06/02/2017   Advanced care planning/counseling discussion 06/02/2017   Mild cognitive impairment 06/02/2017    Past Surgical History:  Procedure Laterality Date   ABDOMINAL HYSTERECTOMY     complete   BREAST SURGERY      Prior to Admission medications   Medication Sig Start Date End Date Taking? Authorizing Provider  calcitRIOL (ROCALTROL) 0.25 MCG capsule Take 0.25 mcg by mouth 3 (three) times a week. 02/11/18   [provider]  JAKAFI 15 MG tablet TAKE 1 TABLET (15 MG TOTAL) BY MOUTH 2 TIMES DAILY FOR 30 DAYS. 12/04/18   Lequita Asal, MD    Allergies Patient has no known allergies.  Family History  Problem Relation Age of Onset   Heart disease Father    Cancer Sister        breast   Cancer Sister        breast   Alzheimer's disease Sister     Social History Social History   Tobacco Use   Smoking status: Former Smoker    Packs/day: 1.00    Years: 30.00    Pack years: 30.00    Quit date: 04/02/1978    Years since quitting: 40.7   Smokeless tobacco: Never Used  Substance Use Topics   Alcohol use: No    Frequency: Never    Comment: occasional glass of wine   Drug use: No      Review of Systems Constitutional: No fever/chills, positive fatigue Eyes: No visual  changes. ENT: No sore throat. Cardiovascular: Denies chest pain. Respiratory: Denies shortness of breath. Gastrointestinal: No abdominal pain.  No nausea, no vomiting.  No diarrhea.  No constipation. Genitourinary: Negative for dysuria. Musculoskeletal: Negative for back pain. Skin: Negative for rash. Neurological: Negative for headaches, focal weakness or numbness.  Positive for agitation All other ROS negative ____________________________________________   PHYSICAL EXAM:  VITAL SIGNS: ED Triage Vitals [12/17/18 1456]  Enc Vitals Group     BP 130/63     Pulse Rate 87     Resp 18     Temp 98.2 F (36.8 C)     Temp Source Oral     SpO2 96 %       Weight 108 lb 0.4 oz (49 kg)     Height 5\' 5"  (1.651 m)     Head Circumference      Peak Flow      Pain Score 0     Pain Loc      Pain Edu?      Excl. in [place]?     Constitutional: Alert and oriented. Well appearing and in no acute distress.  Pleasant elderly female Eyes: Conjunctivae are normal. EOMI. Head: Atraumatic. Nose: No congestion/rhinnorhea. Mouth/Throat: Mucous membranes are moist.   Neck: No stridor. Trachea Midline. FROM Cardiovascular: Normal rate, regular rhythm. Grossly normal heart sounds.  Good peripheral circulation. Respiratory: Normal respiratory effort.  No retractions. Lungs CTAB. Gastrointestinal: Soft and nontender. No distention. No abdominal bruits.  Musculoskeletal: No lower extremity tenderness nor edema.  No joint effusions. Neurologic:  Normal speech and language. No gross focal neurologic deficits are appreciated.  Skin:  Skin is warm, dry and intact. No rash noted. Psychiatric: Mood and affect are normal. Speech and behavior are normal. GU: Deferred   ____________________________________________   LABS (all labs ordered are listed, but only abnormal results are displayed)  Labs Reviewed  BASIC METABOLIC PANEL - Abnormal; Notable for the following components:      Result Value   Sodium 133 (*)    Chloride 97 (*)    Glucose, Bld 102 (*)    BUN 26 (*)    Creatinine, Ser 1.23 (*)    GFR calc non Af Amer 39 (*)    GFR calc Af Amer 45 (*)    All other components within normal limits  CBC WITH DIFFERENTIAL/PLATELET - Abnormal; Notable for the following components:   WBC 12.2 (*)    RBC 2.64 (*)    Hemoglobin 7.7 (*)    HCT 25.0 (*)    RDW 22.0 (*)    Platelets 456 (*)    nRBC 0.5 (*)    Neutro Abs 9.5 (*)    Basophils Absolute 0.2 (*)    Abs Immature Granulocytes 0.40 (*)    All other components within normal limits  SARS CORONAVIRUS 2 (TAT 6-24 HRS)  TYPE AND SCREEN  PREPARE RBC (CROSSMATCH)    ____________________________________________  RADIOLOGY Robert Bellow, personally viewed and evaluated these images (plain radiographs) as part of my medical decision making, as well as reviewing the written report by the radiologist.  ED MD interpretation: CT head personally reviewed no evidence of mass, chest x-ray no evidence of pneumonia  Official radiology report(s): Dg Chest 2 View  Result Date: 12/17/2018 CLINICAL DATA:  Reports she went to [place] today to get blood transfusion, pt took off blood band at home prior to so they were unable to give the blood transfusion. No active bleeding.Hx of  breast surgery. Former smoker. EXAM: CHEST - 2 VIEW COMPARISON:  12/09/2018 FINDINGS: Cardiac silhouette is normal in size. No mediastinal or hilar masses. No evidence of adenopathy. Clear lungs.  No pleural effusion or pneumothorax. Skeletal structures are demineralized but intact. IMPRESSION: No active cardiopulmonary disease. Electronically Signed   By: Lajean Manes M.D.   On: 12/17/2018 18:09   Ct Head Wo Contrast  Result Date: 12/17/2018 CLINICAL DATA:  Unable to obtain much hx from pt, was sent to the ER for a blood transfusion. Per MD notes CT scan is for AMS. EXAM: CT HEAD WITHOUT CONTRAST TECHNIQUE: Contiguous axial images were obtained from the base of the skull through the vertex without intravenous contrast. COMPARISON:  None. FINDINGS: Brain: The ventricles are normal in size, for the patient's age, and normal in configuration. There are no parenchymal masses or mass effect. There is no evidence of an infarct. There are no extra-axial masses or abnormal fluid collections. No intracranial hemorrhage. Mild periventricular white matter hypoattenuation is noted consistent with chronic microvascular ischemic change. Vascular: No hyperdense vessel or unexpected calcification. Skull: Normal. Negative for fracture or focal lesion. Sinuses/Orbits: Globes and orbits are unremarkable.  Visualized sinuses and mastoid air cells are clear. Other: None. IMPRESSION: 1. No acute intracranial abnormalities. 2. Mild chronic microvascular ischemic change. Mild, age-appropriate, volume loss. Electronically Signed   By: Lajean Manes M.D.   On: 12/17/2018 17:59    ____________________________________________   PROCEDURES  Procedure(s) performed (including Critical Care):  .Critical Care Performed by: Vanessa Greenfield, MD Authorized by: Vanessa Clarkrange, MD   Critical care provider statement:    Critical care time (minutes):  30   Critical care was necessary to treat or prevent imminent or life-threatening deterioration of the following conditions: symptomatic anemia requiring transfusion.   Critical care was time spent personally by me on the following activities:  Discussions with consultants, evaluation of patient's response to treatment, examination of patient, ordering and performing treatments and interventions, ordering and review of laboratory studies, ordering and review of radiographic studies, pulse oximetry, re-evaluation of patient's condition, obtaining history from patient or surrogate and review of old charts     ____________________________________________   [place] / [place] / ED COURSE  Laura Wilcox was evaluated in Emergency Department on 12/17/2018 for the symptoms described in the history of present illness. She was evaluated in the context of the global COVID-19 pandemic, which necessitated consideration that the patient might be at risk for infection with the SARS-CoV-2 virus that causes COVID-19. Institutional protocols and algorithms that pertain to the evaluation of patients at risk for COVID-19 are in a state of rapid change based on information released by regulatory bodies including the CDC and federal and state organizations. These policies and algorithms were followed during the patient's care in the ED.    Patient presents from  oncology for need for blood transfusion as well as some increasing agitation and concern for potential underlying infection.  Blood cultures and urine cultures are cooking from yesterday.  Will evaluate basic labs to evaluate further abnormalities.  Will get CT head to ensure there is no evidence of hydrocephalus, tumor or other intracranial process.  Patient is having some intermittent coughing when I am examining her so we will get a chest x-ray as well.   Patient has a hemoglobin of 7.7.  Kidney function is around baseline at 1.23  CT head negative  Chest x-ray negative  Discussed with the hospital team for admission  to transfuse 1 unit of blood as well as to see oncology in the morning for her anemia as well as to work with PT to make sure patient is safe for discharge home   ____________________________________________   FINAL CLINICAL IMPRESSION(S) / ED DIAGNOSES   Final diagnoses:  Anemia, unspecified type      MEDICATIONS GIVEN DURING THIS VISIT:  Medications  0.9 %  sodium chloride infusion (has no administration in time range)     ED Discharge Orders    None       Note:  This document was prepared using Dragon voice recognition software and may include unintentional dictation errors.   Vanessa Alvord, MD 12/17/18 1830    Vanessa [place], MD 12/24/18 1010

## 2018-12-17 NOTE — ED Notes (Signed)
Report called to brandy rn floor nurse 

## 2018-12-17 NOTE — ED Notes (Signed)
primedoc in with pt now 

## 2018-12-17 NOTE — Progress Notes (Signed)
Family Meeting Note  Advance Directive:yes  Today a meeting took place with the Patient and daughter  The following clinical team members were present during this meeting:MD  The following were discussed:Patient's diagnosis: Polycythemia, symptomatic anemia, generalized weakness, Patient's progosis: Unable to determine and Goals for treatment: Full Code  Additional follow-up to be provided: Oncology  Time spent during discussion:20 minutes  Hornak Basta, MD

## 2018-12-17 NOTE — ED Notes (Signed)
Consent signed by daughter for blood transfusion

## 2018-12-17 NOTE — H&P (Signed)
[facility] [place] NAME: Laura Wilcox    MR#:  [id number]  DATE OF BIRTH:  1926-10-22  DATE OF ADMISSION:  12/17/2018  PRIMARY CARE PHYSICIAN: Patient, No Pcp Per   REQUESTING/REFERRING PHYSICIAN: [place]  CHIEF COMPLAINT:   Chief Complaint  Patient presents with  . Blood Transfusion    HISTORY OF PRESENT ILLNESS: Laura Wilcox  is a 85 y.o. female with a known history of polycythemia, follows with cancer center for many years.  Lately she is running anemic and cancer center is planning to do more work-up to find out the reason. 10 days ago she presented to emergency room with not feeling well but work-up was not significant so she was sent home.  After 2 days again she came back with fever and found to have UTI where she was sent home with oral Keflex for 7 days.  She finished the course but continued to feel weak since the sickness started she is very forgetful have no energy to get up and walk and telling the daughter that I wish I would just die. Before the sickness started 2 weeks ago, she was very fun-loving and active person and did not require any support to walk as per her daughter. She continued to run low-grade fever.  She is he has generalized weakness, went to cancer center today and they did blood work and noted she is anemic so advised to go to emergency room to have blood transfusion and get admitted for further work-up for her weakness. Daughter denies any focal weakness.  As per her patient is more forgetful since last 2 weeks while in the past she never had that problem.  PAST MEDICAL HISTORY:   Past Medical History:  Diagnosis Date  . Polycythemia     PAST SURGICAL HISTORY:  Past Surgical History:  Procedure Laterality Date  . ABDOMINAL HYSTERECTOMY     complete  . BREAST SURGERY      SOCIAL HISTORY:  Social History   Tobacco Use  . Smoking status: Former Smoker    Packs/day: 1.00    Years: 30.00    Pack  years: 30.00    Quit date: 04/02/1978    Years since quitting: 40.7  . Smokeless tobacco: Never Used  Substance Use Topics  . Alcohol use: No    Frequency: Never    Comment: occasional glass of wine    FAMILY HISTORY:  Family History  Problem Relation Age of Onset  . Heart disease Father   . Cancer Sister        breast  . Cancer Sister        breast  . Alzheimer's disease Sister     DRUG ALLERGIES: No Known Allergies  REVIEW OF SYSTEMS:   CONSTITUTIONAL: No fever, have fatigue or weakness.  EYES: No blurred or double vision.  EARS, NOSE, AND THROAT: No tinnitus or ear pain.  RESPIRATORY: No cough, shortness of breath, wheezing or hemoptysis.  CARDIOVASCULAR: No chest pain, orthopnea, edema.  GASTROINTESTINAL: No nausea, vomiting, diarrhea or abdominal pain.  GENITOURINARY: No dysuria, hematuria.  ENDOCRINE: No polyuria, nocturia,  HEMATOLOGY: No anemia, easy bruising or bleeding SKIN: No rash or lesion. MUSCULOSKELETAL: No joint pain or arthritis.   NEUROLOGIC: No tingling, numbness, weakness.  PSYCHIATRY: No anxiety or depression.   MEDICATIONS AT HOME:  Prior to Admission medications   Medication Sig Start Date End Date Taking? Authorizing Provider  calcitRIOL (ROCALTROL) 0.25 MCG capsule Take 0.25 mcg  by mouth 3 (three) times a week. 02/11/18  Yes [provider]  JAKAFI 15 MG tablet TAKE 1 TABLET (15 MG TOTAL) BY MOUTH 2 TIMES DAILY FOR 30 DAYS. 12/04/18  Yes Corcoran, Melissa C, MD      PHYSICAL EXAMINATION:   VITAL SIGNS: Blood pressure (!) 167/75, pulse 89, temperature 98.3 F (36.8 C), temperature source Oral, resp. rate 20, height 5\' 5"  (1.651 m), weight 49 kg, SpO2 100 %.  GENERAL:  85 y.o.-year-old patient lying in the bed with no acute distress.  EYES: Pupils equal, round, reactive to light and accommodation. No scleral icterus. Extraocular muscles intact.  HEENT: Head atraumatic, normocephalic. Oropharynx and nasopharynx clear.  Very hard of  hearing. NECK:  Supple, no jugular venous distention. No thyroid enlargement, no tenderness.  LUNGS: Normal breath sounds bilaterally, no wheezing, rales,rhonchi or crepitation. No use of accessory muscles of respiration.  CARDIOVASCULAR: S1, S2 normal. No murmurs, rubs, or gallops.  ABDOMEN: Soft, nontender, nondistended. Bowel sounds present. No organomegaly or mass.  EXTREMITIES: No pedal edema, cyanosis, or clubbing.  NEUROLOGIC: Cranial nerves II through XII are intact. Muscle strength 4/5 in all extremities. Sensation intact. Gait not checked.  PSYCHIATRIC: The patient is alert and oriented x 3.  SKIN: No obvious rash, lesion, or ulcer.   LABORATORY PANEL:   CBC Recent Labs  Lab 12/16/18 1040 12/17/18 1458  WBC 12.4* 12.2*  HGB 7.5* 7.7*  HCT 24.2* 25.0*  PLT 452* 456*  MCV 95.3 94.7  MCH 29.5 29.2  MCHC 31.0 30.8  RDW 21.3* 22.0*  LYMPHSABS 0.8 1.1  MONOABS 1.0 0.6  EOSABS 0.5 0.4  BASOSABS 0.1 0.2*   ------------------------------------------------------------------------------------------------------------------  Chemistries  Recent Labs  Lab 12/16/18 1040 12/17/18 1458  NA 132* 133*  K 4.4 4.9  CL 98 97*  CO2 24 26  GLUCOSE 127* 102*  BUN 26* 26*  CREATININE 1.26* 1.23*  CALCIUM 9.0 9.4  AST 18  --   ALT 14  --   ALKPHOS 66  --   BILITOT 0.5  --    ------------------------------------------------------------------------------------------------------------------ estimated creatinine clearance is 23.5 mL/min (A) (by C-G formula based on SCr of 1.23 mg/dL (H)). ------------------------------------------------------------------------------------------------------------------ No results for input(s): TSH, T4TOTAL, T3FREE, THYROIDAB in the last 72 hours.  Invalid input(s): FREET3   Coagulation profile No results for input(s): INR, PROTIME in the last 168  hours. ------------------------------------------------------------------------------------------------------------------- No results for input(s): DDIMER in the last 72 hours. -------------------------------------------------------------------------------------------------------------------  Cardiac Enzymes No results for input(s): CKMB, TROPONINI, MYOGLOBIN in the last 168 hours.  Invalid input(s): CK ------------------------------------------------------------------------------------------------------------------ Invalid input(s): POCBNP  ---------------------------------------------------------------------------------------------------------------  Urinalysis    Component Value Date/Time   COLORURINE YELLOW 12/16/2018 [place] 12/16/2018 1214   LABSPEC <1.005 (L) 12/16/2018 1214   PHURINE 5.5 12/16/2018 [place] 12/16/2018 1214   HGBUR NEGATIVE 12/16/2018 1214   BILIRUBINUR NEGATIVE 12/16/2018 1214   KETONESUR NEGATIVE 12/16/2018 1214   PROTEINUR NEGATIVE 12/16/2018 1214   NITRITE NEGATIVE 12/16/2018 1214   LEUKOCYTESUR NEGATIVE 12/16/2018 1214     RADIOLOGY: Dg Chest 2 View  Result Date: 12/17/2018 CLINICAL DATA:  Reports she went to [facility] today to get blood transfusion, pt took off blood band at home prior to so they were unable to give the blood transfusion. No active bleeding.Hx of breast surgery. Former smoker. EXAM: CHEST - 2 VIEW COMPARISON:  12/09/2018 FINDINGS: Cardiac silhouette is normal in size. No mediastinal or hilar masses. No evidence of adenopathy. Clear lungs.  No pleural effusion or pneumothorax. Skeletal structures are demineralized but intact. IMPRESSION: No active cardiopulmonary disease. Electronically Signed   By: Lajean Manes M.D.   On: 12/17/2018 18:09   Ct Head Wo Contrast  Result Date: 12/17/2018 CLINICAL DATA:  Unable to obtain much hx from pt, was sent to the ER for a blood transfusion. Per MD notes CT  scan is for AMS. EXAM: CT HEAD WITHOUT CONTRAST TECHNIQUE: Contiguous axial images were obtained from the base of the skull through the vertex without intravenous contrast. COMPARISON:  None. FINDINGS: Brain: The ventricles are normal in size, for the patient's age, and normal in configuration. There are no parenchymal masses or mass effect. There is no evidence of an infarct. There are no extra-axial masses or abnormal fluid collections. No intracranial hemorrhage. Mild periventricular white matter hypoattenuation is noted consistent with chronic microvascular ischemic change. Vascular: No hyperdense vessel or unexpected calcification. Skull: Normal. Negative for fracture or focal lesion. Sinuses/Orbits: Globes and orbits are unremarkable. Visualized sinuses and mastoid air cells are clear. Other: None. IMPRESSION: 1. No acute intracranial abnormalities. 2. Mild chronic microvascular ischemic change. Mild, age-appropriate, volume loss. Electronically Signed   By: Lajean Manes M.D.   On: 12/17/2018 17:59    EKG: Orders placed or performed during the hospital encounter of 12/07/18  . ED EKG  . ED EKG    IMPRESSION AND PLAN:  *Symptomatic anemia Transfuse 1 unit of blood. Patient has history of polycythemia. Called oncology consult for further management. She was referred from cancer clinic to ER for blood transfusion and admission.  *Generalized weakness This could be combination of old age, anemia, recovering from UTI. Need physical therapy evaluation and palliative care consult if family and oncologist agree Her COVID-19 test is still not done Urinalysis is negative. .  *Chronic kidney disease stage III This appears to be stable.  All the records are reviewed and case discussed with ED provider. Management plans discussed with the patient, family and they are in agreement.  CODE STATUS: Full code  Patient's daughter was present in the room during my visit  TOTAL TIME TAKING CARE OF  THIS PATIENT: 45 minutes.    Eyman Basta M.D on 12/17/2018   Between 7am to 6pm - Pager - [phone]  After 6pm go to [email or web address] - password [email or web address] [facility]  Office  [phone]  CC: Primary care physician; Patient, No Pcp Per   Note: This dictation was prepared with Dragon dictation along with smaller phrase technology. Any transcriptional errors that result from this process are unintentional.

## 2018-12-17 NOTE — ED Notes (Signed)
1 st unit prbc's infusing now.  Family with pt.  Pt alert, confused.

## 2018-12-17 NOTE — ED Notes (Signed)
ED Provider at bedside. 

## 2018-12-17 NOTE — ED Notes (Signed)
Pt alert  Family with pt.  prbc's infusing.    Pt waiting on admission bed.

## 2018-12-17 NOTE — ED Notes (Signed)
md in with pt on arrival to treatment room.  Family with pt.  Iv in place.  Pt rude to staff.  Iv in place.  Pt here for blood transfusion.

## 2018-12-17 NOTE — ED Notes (Signed)
Patient transported to CT 

## 2018-12-17 NOTE — Progress Notes (Signed)
Patient arrived for unit of packed red blood cells.  It was noted that the patient was not wearing the blood band that was placed on there yesterday.  I approached the daughter and she said that she remembers the patient taking it off and throwing it away.  She said that no one told her to keep it on.  Dr. Mike Gip remembers telling the daughter that the band would need to stay on.    Call placed to lab to see if the prepared unit of blood could be couriered back to the hospital.  They will call the courier and have it sent back.  I inquired to see if we need to get another type and cross on the patient and I was advised to let the hospital do their own since it might be quicker for them to do so.  Dr. Mike Gip called the ED and informed them of the patient's situation and that she needs to be admitted in order to get a unit of blood today/tonight.  Daughter was grateful that the doctor called the ED and that they will get her admitted for the transfusion.    All events explained to patient and reason for admission was reviewed with patient.  Patient just wants to go home to take a nap but understood that she needed the blood.

## 2018-12-17 NOTE — ED Notes (Signed)
Family at bedside. 

## 2018-12-18 ENCOUNTER — Inpatient Hospital Stay: Payer: Medicare Other

## 2018-12-18 ENCOUNTER — Inpatient Hospital Stay: Payer: Medicare Other | Admitting: Oncology

## 2018-12-18 DIAGNOSIS — D649 Anemia, unspecified: Secondary | ICD-10-CM | POA: Diagnosis not present

## 2018-12-18 DIAGNOSIS — R531 Weakness: Secondary | ICD-10-CM | POA: Diagnosis not present

## 2018-12-18 DIAGNOSIS — N183 Chronic kidney disease, stage 3 (moderate): Secondary | ICD-10-CM | POA: Diagnosis not present

## 2018-12-18 LAB — BASIC METABOLIC PANEL
Anion gap: 6 (ref 5–15)
BUN: 22 mg/dL (ref 8–23)
CO2: 26 mmol/L (ref 22–32)
Calcium: 8.7 mg/dL — ABNORMAL LOW (ref 8.9–10.3)
Chloride: 103 mmol/L (ref 98–111)
Creatinine, Ser: 1.11 mg/dL — ABNORMAL HIGH (ref 0.44–1.00)
GFR calc Af Amer: 51 mL/min — ABNORMAL LOW (ref >60)
GFR calc non Af Amer: 44 mL/min — ABNORMAL LOW (ref >60)
Glucose, Bld: 96 mg/dL (ref 70–99)
Potassium: 4.8 mmol/L (ref 3.5–5.1)
Sodium: 135 mmol/L (ref 135–145)

## 2018-12-18 LAB — BPAM RBC
Blood Product Expiration Date: 202010082359
Blood Product Expiration Date: 202010082359
ISSUE DATE / TIME: 202009171756
ISSUE DATE / TIME: 202009171756
Unit Type and Rh: 5100
Unit Type and Rh: 5100

## 2018-12-18 LAB — TYPE AND SCREEN
ABO/RH(D): O POS
ABO/RH(D): O POS
Antibody Screen: NEGATIVE
Antibody Screen: NEGATIVE
Unit division: 0
Unit division: 0

## 2018-12-18 LAB — CBC
HCT: 29 % — ABNORMAL LOW (ref 36.0–46.0)
Hemoglobin: 9.4 g/dL — ABNORMAL LOW (ref 12.0–15.0)
MCH: 29.4 pg (ref 26.0–34.0)
MCHC: 32.4 g/dL (ref 30.0–36.0)
MCV: 90.6 fL (ref 80.0–100.0)
Platelets: 364 10*3/uL (ref 150–400)
RBC: 3.2 MIL/uL — ABNORMAL LOW (ref 3.87–5.11)
RDW: 19.9 % — ABNORMAL HIGH (ref 11.5–15.5)
WBC: 10.2 10*3/uL (ref 4.0–10.5)
nRBC: 0.6 % — ABNORMAL HIGH (ref 0.0–0.2)

## 2018-12-18 LAB — SARS CORONAVIRUS 2 (TAT 6-24 HRS): SARS Coronavirus 2: NEGATIVE

## 2018-12-18 MED ORDER — ADULT MULTIVITAMIN W/MINERALS CH: 1.0000 | ORAL_TABLET | Freq: Every day | ORAL | 1 refills | Status: DC

## 2018-12-18 MED ORDER — QUETIAPINE FUMARATE 25 MG PO TABS: 25.0000 mg | ORAL_TABLET | Freq: Every day | ORAL | Status: DC

## 2018-12-18 MED ORDER — ENSURE ENLIVE PO LIQD: 237.0000 mL | Freq: Two times a day (BID) | ORAL | Status: DC

## 2018-12-18 MED ORDER — ADULT MULTIVITAMIN W/MINERALS CH: 1.0000 | ORAL_TABLET | Freq: Every day | ORAL | Status: DC

## 2018-12-18 MED ORDER — ENSURE ENLIVE PO LIQD: 237.0000 mL | Freq: Two times a day (BID) | ORAL | 12 refills | Status: DC

## 2018-12-18 NOTE — Discharge Summary (Signed)
[facility] [place] NAME: Laura Wilcox    MR#:  [id number]  DATE OF BIRTH:  25-Feb-1927  DATE OF ADMISSION:  12/17/2018 ADMITTING PHYSICIAN: Winsett Basta, MD  DATE OF DISCHARGE: 12/18/2018  PRIMARY CARE PHYSICIAN: Patient, No Pcp Per    ADMISSION DIAGNOSIS:  Anemia, unspecified type [D64.9]  DISCHARGE DIAGNOSIS:  Anemia acute on chronic with history of polycythemia vera status post one unit blood transfusion generalized weakness due to anemia SECONDARY DIAGNOSIS:   Past Medical History:  Diagnosis Date  . Polycythemia     HOSPITAL COURSE:  Laura Wilcox is a 85 y.o. female with polycythemia who presents with generalized weakness.  Patient is had about 1 week of weakness.  Daughter has been staying with her and noted that she is just not able to move around as well as normal.    1. Acute on chronic anemia in the setting of polycythemia vera -pt's [name] has been discontinued by Dr. Mike Gip -hemoglobin was 7.7-- recievedone unit blood transfusion-- 9.4 -feels a lot better -no history of bleeding  2. Generalized weakness due to anemia -physical therapy recommends home health PT. Patient declines  3. History of polycythemia -follow up with Dr. Mike Gip  Overall improved. Discussed discharge plan with patient's daughter who was in the room. questions answered.  CONSULTS OBTAINED:    DRUG ALLERGIES:  No Known Allergies  DISCHARGE MEDICATIONS:   Allergies as of 12/18/2018   No Known Allergies     Medication List    STOP taking these medications   calcitRIOL 0.25 MCG capsule Commonly known as: ROCALTROL   Jakafi 15 MG tablet Generic drug: ruxolitinib phosphate     TAKE these medications   feeding supplement (ENSURE ENLIVE) Liqd Take 237 mLs by mouth 2 (two) times daily between meals.   multivitamin with minerals Tabs tablet Take 1 tablet by mouth daily. Start taking on: December 19, 2018       If you experience worsening of your admission symptoms, develop shortness of breath, life threatening emergency, suicidal or homicidal thoughts you must seek medical attention immediately by calling 911 or calling your MD immediately  if symptoms less severe.  You Must read complete instructions/literature along with all the possible adverse reactions/side effects for all the Medicines you take and that have been prescribed to you. Take any new Medicines after you have completely understood and accept all the possible adverse reactions/side effects.   Please note  You were cared for by a hospitalist during your hospital stay. If you have any questions about your discharge medications or the care you received while you were in the hospital after you are discharged, you can call the unit and asked to speak with the hospitalist on call if the hospitalist that took care of you is not available. Once you are discharged, your primary care physician will handle any further medical issues. Please note that NO REFILLS for any discharge medications will be authorized once you are discharged, as it is imperative that you return to your primary care physician (or establish a relationship with a primary care physician if you do not have one) for your aftercare needs so that they can reassess your need for medications and monitor your lab values. Today   SUBJECTIVE  Doing well today  VITAL SIGNS:  Blood pressure (!) 141/63, pulse 83, temperature 98.1 F (36.7 C), temperature source Oral, resp. rate 18, height 5\' 5"  (1.651 m), weight 49 kg, SpO2 92 %.  I/O:    Intake/Output Summary (Last 24 hours) at 12/18/2018 1350 Last data filed at 12/18/2018 1014 Gross per 24 hour  Intake 1040 ml  Output -  Net 1040 ml    PHYSICAL EXAMINATION:  GENERAL:  85 y.o.-year-old patient lying in the bed with no acute distress.  EYES: Pupils equal, round, reactive to light and accommodation. No scleral icterus.  Extraocular muscles intact.  HEENT: Head atraumatic, normocephalic. Oropharynx and nasopharynx clear.  NECK:  Supple, no jugular venous distention. No thyroid enlargement, no tenderness.  LUNGS: Normal breath sounds bilaterally, no wheezing, rales,rhonchi or crepitation. No use of accessory muscles of respiration.  CARDIOVASCULAR: S1, S2 normal. No murmurs, rubs, or gallops.  ABDOMEN: Soft, non-tender, non-distended. Bowel sounds present. No organomegaly or mass.  EXTREMITIES: No pedal edema, cyanosis, or clubbing.  NEUROLOGIC: Cranial nerves II through XII are intact. Muscle strength 5/5 in all extremities. Sensation intact. Gait not checked. weak PSYCHIATRIC: The patient is alert and oriented x 3.  SKIN: No obvious rash, lesion, or ulcer.   DATA REVIEW:   CBC  Recent Labs  Lab 12/18/18 0530  WBC 10.2  HGB 9.4*  HCT 29.0*  PLT 364    Chemistries  Recent Labs  Lab 12/16/18 1040  12/18/18 0530  NA 132*   < > 135  K 4.4   < > 4.8  CL 98   < > 103  CO2 24   < > 26  GLUCOSE 127*   < > 96  BUN 26*   < > 22  CREATININE 1.26*   < > 1.11*  CALCIUM 9.0   < > 8.7*  AST 18  --   --   ALT 14  --   --   ALKPHOS 66  --   --   BILITOT 0.5  --   --    < > = values in this interval not displayed.    Microbiology Results   Recent Results (from the past 240 hour(s))  Urine Culture     Status: Abnormal   Collection Time: 12/09/18  1:00 PM   Specimen: Urine, Random  Result Value Ref Range Status   Specimen Description   Final    URINE, RANDOM Performed at [facility], [address], [place], [place] [address]    Special Requests   Final    NONE Performed at [facility], [address] [place], [place] [address]    Culture >=100,000 COLONIES/mL ESCHERICHIA COLI (A)  Final   Report Status 12/12/2018 FINAL  Final   Organism ID, Bacteria ESCHERICHIA COLI (A)  Final      Susceptibility   Escherichia coli - MIC*    AMPICILLIN <=2 SENSITIVE Sensitive      CEFAZOLIN <=4 SENSITIVE Sensitive     CEFTRIAXONE <=1 SENSITIVE Sensitive     CIPROFLOXACIN <=0.25 SENSITIVE Sensitive     GENTAMICIN <=1 SENSITIVE Sensitive     IMIPENEM <=0.25 SENSITIVE Sensitive     NITROFURANTOIN <=16 SENSITIVE Sensitive     TRIMETH/SULFA <=20 SENSITIVE Sensitive     AMPICILLIN/SULBACTAM <=2 SENSITIVE Sensitive     PIP/TAZO <=4 SENSITIVE Sensitive     Extended ESBL NEGATIVE Sensitive     * >=100,000 COLONIES/mL ESCHERICHIA COLI  Urine Culture     Status: None   Collection Time: 12/16/18 12:14 PM   Specimen: Urine, Clean Catch  Result Value Ref Range Status   Specimen Description   Final    URINE, CLEAN CATCH Performed at [facility], [address] [place], [place] [address]    Special Requests   Final    NONE Performed at [facility], [address], [place], [place] [address]    Culture   Final    NO GROWTH Performed at [facility], [place] [address], [place], [place] [address]    Report Status 12/17/2018 FINAL  Final  Culture, blood (routine x 2)     Status: None (Preliminary result)   Collection Time: 12/16/18 12:21 PM   Specimen: BLOOD  Result Value Ref Range Status   Specimen Description   Final    BLOOD RIGHT ANTECUBITAL Performed at [facility], [address], [place], [place] [address]    Special Requests   Final    BOTTLES DRAWN AEROBIC AND ANAEROBIC Blood Culture adequate volume Performed at [facility], [address], [place], [place] [address]    Culture   Final    NO GROWTH 2 DAYS Performed at [facility], [address] [place], [place] [address]    Report Status PENDING  Incomplete  Culture, blood (routine x 2)     Status: None (Preliminary result)   Collection Time: 12/16/18 12:28 PM   Specimen: BLOOD  Result Value Ref Range Status   Specimen Description   Final    BLOOD BLOOD RIGHT HAND Performed at [facility], [address],  [place], [place] [address]    Special Requests   Final    BOTTLES DRAWN AEROBIC AND ANAEROBIC Blood Culture adequate volume Performed at [facility], [address] [place], [place] [address]    Culture   Final    NO GROWTH 2 DAYS Performed at [facility], [address] [place], [place] [address]    Report Status PENDING  Incomplete  SARS CORONAVIRUS 2 (TAT 6-24 HRS) Nasopharyngeal Nasopharyngeal Swab     Status: None   Collection Time: 12/17/18  6:35 PM   Specimen: Nasopharyngeal Swab  Result Value Ref Range Status   SARS Coronavirus 2 NEGATIVE NEGATIVE Final    Comment: (NOTE) SARS-CoV-2 target nucleic acids are NOT DETECTED. The SARS-CoV-2 RNA is generally detectable in upper and lower respiratory specimens during the acute phase of infection. Negative results do not preclude SARS-CoV-2 infection, do not rule out co-infections with other pathogens, and should not be used as the sole basis for treatment or other patient management decisions. Negative results must be combined with clinical observations, patient history, and epidemiological information. The expected result is Negative. Fact Sheet for Patients: SugarRoll.be Fact Sheet for Healthcare Providers: https://www.woods-mathews.com/ This test is not yet approved or cleared by the [place] FDA and  has been authorized for detection and/or diagnosis of SARS-CoV-2 by FDA under an Emergency Use Authorization (EUA). This EUA will remain  in effect (meaning this test can be used) for the duration of the COVID-19 declaration under Section 56 4(b)(1) of the Act, 21 U.S.C. section 360bbb-3(b)(1), unless the authorization is terminated or revoked sooner. Performed at [facility], [place] [address] [place],  [address]     RADIOLOGY:  Dg Chest 2 View  Result Date: 12/17/2018 CLINICAL DATA:  Reports she went to [facility] today to get blood  transfusion, pt took off blood band at home prior to so they were unable to give the blood transfusion. No active bleeding.Hx of breast surgery. Former smoker. EXAM: CHEST - 2 VIEW COMPARISON:  12/09/2018 FINDINGS:  Cardiac silhouette is normal in size. No mediastinal or hilar masses. No evidence of adenopathy. Clear lungs.  No pleural effusion or pneumothorax. Skeletal structures are demineralized but intact. IMPRESSION: No active cardiopulmonary disease. Electronically Signed   By: Lajean Manes M.D.   On: 12/17/2018 18:09   Ct Head Wo Contrast  Result Date: 12/17/2018 CLINICAL DATA:  Unable to obtain much hx from pt, was sent to the ER for a blood transfusion. Per MD notes CT scan is for AMS. EXAM: CT HEAD WITHOUT CONTRAST TECHNIQUE: Contiguous axial images were obtained from the base of the skull through the vertex without intravenous contrast. COMPARISON:  None. FINDINGS: Brain: The ventricles are normal in size, for the patient's age, and normal in configuration. There are no parenchymal masses or mass effect. There is no evidence of an infarct. There are no extra-axial masses or abnormal fluid collections. No intracranial hemorrhage. Mild periventricular white matter hypoattenuation is noted consistent with chronic microvascular ischemic change. Vascular: No hyperdense vessel or unexpected calcification. Skull: Normal. Negative for fracture or focal lesion. Sinuses/Orbits: Globes and orbits are unremarkable. Visualized sinuses and mastoid air cells are clear. Other: None. IMPRESSION: 1. No acute intracranial abnormalities. 2. Mild chronic microvascular ischemic change. Mild, age-appropriate, volume loss. Electronically Signed   By: Lajean Manes M.D.   On: 12/17/2018 17:59     CODE STATUS:     Code Status Orders  (From admission, onward)         Start     Ordered   12/17/18 2053  Full code  Continuous     12/17/18 2052        Code Status History    This patient has a current code status  but no historical code status.   Advance Care Planning Activity      TOTAL TIME TAKING CARE OF THIS PATIENT: *40* minutes.    Fritzi Mandes M.D on 12/18/2018 at 1:50 PM  Between 7am to 6pm - Pager - [phone] After 6pm go to [email or web address] - password [email or web address] [facility]  Office  [phone]  CC: Primary care physician; Patient, No Pcp Per

## 2018-12-18 NOTE — Progress Notes (Signed)
Initial Nutrition Assessment  DOCUMENTATION CODES:   Underweight  INTERVENTION:   Ensure Enlive po BID, each supplement provides 350 kcal and 20 grams of protein  MVI daily   NUTRITION DIAGNOSIS:   Inadequate oral intake related to acute illness as evidenced by per patient/family report.  GOAL:   Patient will meet greater than or equal to 90% of their needs  MONITOR:   PO intake, Supplement acceptance, Labs, Weight trends, Skin, I & O's  REASON FOR ASSESSMENT:   Malnutrition Screening Tool    ASSESSMENT:   85 y.o. female with a known history of polycythemia admitted with anemia and generalized weakness  RD working remotely.  Per chart review, pt with poor appetite and oral intake for several days pta. RD suspects pt with poor appetite and oral intake at baseline r/t advanced age. RD will add supplements and MVI to help pt meet her estimated needs. Per chart, pt is weight stable pta.   Pt at high risk for malnutrition but unable to diagnose at this time as NFPE cannot be performed.   Medications reviewed and include: heparin  Labs reviewed: creat 1.11(H) Hgb 9.4(L), Hct 29.0(L)  Unable to complete Nutrition-Focused physical exam at this time.   Diet Order:   Diet Order            Diet regular Room service appropriate? Yes; Fluid consistency: Thin  Diet effective now             EDUCATION NEEDS:   No education needs have been identified at this time  Skin:  Skin Assessment: Reviewed RN Assessment(ecchymosis)  Last BM:  pta  Height:   Ht Readings from Last 1 Encounters:  12/17/18 5\' 5"  (1.651 m)    Weight:   Wt Readings from Last 1 Encounters:  12/17/18 49 kg    Ideal Body Weight:  56.8 kg  BMI:  Body mass index is 17.98 kg/m.  Estimated Nutritional Needs:   Kcal:  1200-1400kcal/day  Protein:  70-80g/day  Fluid:  >1.2L/day  Koleen Distance MS, RD, LDN Pager #- [phone] Office#- [phone] After Hours Pager: [phone]

## 2018-12-18 NOTE — Evaluation (Signed)
Physical Therapy Evaluation Patient Details Name: Laura Wilcox MRN: [id number] DOB: 28-Apr-1926 Today's Date: 12/18/2018   History of Present Illness  Laura Wilcox is a 86yoF who comes to [facility] after 2W weakness, malaise, concerns of cognitive decline per DTR, pt found to have anemia in the setting of Hx polycythemia. PTA pt lived alone, fairly independent and physical able bodied. DTR provided tranportation assist.  Clinical Impression  Pt admitted with above diagnosis. Pt currently with functional limitations due to the deficits listed below (see "PT Problem List"). Upon entry, pt in bed, awake and agreeable to participate. The pt is alert and oriented x3, pleasant, conversational, and generally a good historian, however due to [facility], most history is taken from DTR in room. Stand-by assist provided for transfers as pt reports feeling weak and general fatigued. Tolerance to standing in place is poor, pt asking to sit after 45 seconds, but agreeable to move on to AMB. Pt AMB 157ft prior to onset need to sit for rest. C/o bilat anterior thigh pain toward end fo AMB distance as well as N/T below the knees bilat, both of which resolve entirely with transition to sitting. These c/o pain are confirmed with pt thereafter but between [facility] and cognitive impairment, reporting is inconsistent. 2nd AMB attempt is similarly limited to 5ft. Pt has LOB with turns in room, good awareness of instability of gait and distance limitations. Functional mobility assessment demonstrates increased effort/time requirements, poor tolerance, and need for physical assistance, whereas the patient performed these at a higher level of independence PTA. Pt with acute cognitive impairment and lack of interest in using AD for mobility as she was a community AMB PTA without falls history or DME use, hence assuming these deviations are short lived and prior detail, Pryor Curia recommends stand-by assist upon DC rather than trying to teach RW  use and compliance. Chief Strategy Officer also educated DTR on signs and symptoms of LSS and encouraged to maintain a record of any patient complaints of leg symptoms with mobility that persist s/p DC. Pt will benefit from skilled PT intervention to increase independence and safety with basic mobility in preparation for discharge to the venue listed below.       Follow Up Recommendations Home health PT;Supervision for mobility/OOB    Equipment Recommendations  None recommended by PT;Other (comment)(Recommended standby assist for assumed short-term limitations, as pt typifcally needs no AD, and cognitively would not be safe with one at present.)    Recommendations for Other Services       Precautions / Restrictions Precautions Precautions: Fall      Mobility  Bed Mobility Overal bed mobility: Modified Independent                Transfers Overall transfer level: Needs assistance   Transfers: Sit to/from Stand Sit to Stand: Supervision;Min guard         General transfer comment: Pt reports feeling weak, standing in place makes legs tired  Ambulation/Gait Ambulation/Gait assistance: Min guard Gait Distance (Feet): 125 Feet(188ft; then 75ft) Assistive device: None Gait Pattern/deviations: Wide base of support     General Gait Details: unsteady, moreso in latter half of distance. LOB with turning each time.  Stairs            Wheelchair Mobility    Modified Rankin (Stroke Patients Only)       Balance Overall balance assessment: Mild deficits observed, not formally tested;Needs assistance Sitting-balance support: No upper extremity supported Sitting balance-Leahy Scale: Good     Standing  balance support: During functional activity;No upper extremity supported Standing balance-Leahy Scale: Good                               Pertinent Vitals/Pain Pain Assessment: Faces(c/o bilat thigh pain during AMB, N/T in lower legs.) Faces Pain Scale: Hurts little  more Pain Location: bilat legs Pain Intervention(s): Limited activity within patient's tolerance;Monitored during session;Premedicated before session;Repositioned    Home Living Family/patient expects to be discharged to:: Private residence Living Arrangements: Alone   Type of Home: Apartment Home Access: Level entry     Home Layout: One level Home Equipment: None;Grab bars - tub/shower      Prior Function Level of Independence: Independent         Comments: limited community to community distance AMB without ADl; DTR reporrts funcitonally very mobile. No falls/balance issues; DTR helps with transport.     Hand Dominance   Dominant Hand: Right    Extremity/Trunk Assessment   Upper Extremity Assessment Upper Extremity Assessment: Generalized weakness    Lower Extremity Assessment Lower Extremity Assessment: Generalized weakness;Overall WFL for tasks assessed       Communication   Communication: No difficulties  Cognition Arousal/Alertness: Awake/alert Behavior During Therapy: WFL for tasks assessed/performed Overall Cognitive Status: Within Functional Limits for tasks assessed                                        General Comments      Exercises     Assessment/Plan    PT Assessment Patient needs continued PT services  PT Problem List Decreased strength;Decreased range of motion;Decreased activity tolerance;Decreased balance;Decreased cognition       PT Treatment Interventions Balance training;Gait training;Stair training;Functional mobility training;Therapeutic activities;Therapeutic exercise;DME instruction;Patient/family education    PT Goals (Current goals can be found in the Care Plan section)  Acute Rehab PT Goals Patient Stated Goal: LEave hospital PT Goal Formulation: With patient/family Time For Goal Achievement: 01/01/19 Potential to Achieve Goals: Good    Frequency Min 2X/week   Barriers to discharge Decreased  caregiver support DTR planning to stay with pt more at DC    Co-evaluation               AM-PAC PT "6 Clicks" Mobility  Outcome Measure Help needed turning from your back to your side while in a flat bed without using bedrails?: None Help needed moving from lying on your back to sitting on the side of a flat bed without using bedrails?: None Help needed moving to and from a bed to a chair (including a wheelchair)?: None Help needed standing up from a chair using your arms (e.g., wheelchair or bedside chair)?: A Little Help needed to walk in hospital room?: A Little Help needed climbing 3-5 steps with a railing? : A Little 6 Click Score: 21    End of Session Equipment Utilized During Treatment: Gait belt Activity Tolerance: Patient limited by fatigue Patient left: in bed;with family/visitor present   PT Visit Diagnosis: Unsteadiness on feet (R26.81);Difficulty in walking, not elsewhere classified (R26.2);Other abnormalities of gait and mobility (R26.89)    Time: 0950-1009 PT Time Calculation (min) (ACUTE ONLY): 19 min   Charges:   PT Evaluation $PT Eval Low Complexity: 1 Low PT Treatments $Therapeutic Exercise: 8-22 mins       1:10 PM, 12/18/18 Etta Grandchild, PT,  DPT Physical Therapist - [facility]  [phone] ([place])    Shraga Custard C 12/18/2018, 1:03 PM

## 2018-12-18 NOTE — Progress Notes (Signed)
Patient discharged home. Jaesean Litzau S, RN  

## 2018-12-18 NOTE — TOC Transition Note (Signed)
Transition of Care [facility]) - CM/SW Discharge Note   Patient Details  Name: Laura Wilcox MRN: [id number] Date of Birth: Jun 29, 1926  Transition of Care [facility]) CM/SW Contact:  Candie Chroman, LCSW Phone Number: 12/18/2018, 12:57 PM   Clinical Narrative: CSW met with patient. Daughter at bedside. CSW introduced role and explained that PT recommendations would be discussed. Patient adamantly refusing HHPT. CSW gave daughter list of CMS scores for agencies that serve her zip code and encouraged her to contact her PCP if she changes her mind. Patient does not use any equipment at home. Patient lives alone but daughter lives 12 mins away checks in on her daily. Per MD, patient will likely discharge today. No further concerns. CSW signing off.    Final next level of care: Home/Self Care Barriers to Discharge: Continued Medical Work up   Patient Goals and CMS Choice   CMS Medicare.gov Compare Post Acute Care list provided to:: Patient(Daughter at bedside.) Choice offered to / list presented to : NA  Discharge Placement                       Discharge Plan and Services     Post Acute Care Choice: NA            DME Agency: NA       HH Arranged: NA          Social Determinants of Health (SDOH) Interventions     Readmission Risk Interventions No flowsheet data found.

## 2018-12-21 LAB — CULTURE, BLOOD (ROUTINE X 2)
Culture: NO GROWTH
Culture: NO GROWTH
Special Requests: ADEQUATE
Special Requests: ADEQUATE

## 2018-12-22 ENCOUNTER — Telehealth: Payer: Self-pay

## 2018-12-22 ENCOUNTER — Encounter: Payer: Self-pay | Admitting: Hematology and Oncology

## 2018-12-22 NOTE — Telephone Encounter (Signed)
Left a message to call back to the office to do her assesment/ no answer

## 2018-12-22 NOTE — Progress Notes (Signed)
The patient daughter report her mother is sleeping 22 hours out of 24, which is a concern to her. The patient Name, DOB and medication has been verified by Ms [name] ( daughter

## 2018-12-22 NOTE — Progress Notes (Signed)
[facility]  [address] [place], [place] [address] Phone: [phone]  Fax: [phone]   Clinic Day:  12/23/2018  Referring physician: No ref. provider found  Chief Complaint: Laura Wilcox is a 85 y.o. female with polycythemia rubra vera (PV) on Jakafi who is seen for 1 week assessment after recent hospitalization.   HPI: The patient was last seen in the hematology clinic on 12/16/2018. At that time, her daughter noted that she had been "talking out of her head" at times.   She was unsure if this was due to the UTI or mental changes. She had extreme fatigue.  She had difficulty standing for even short periods of time.  She notes that she was too fatigued to brush her hair.  She denied falls.  Her daughter reported low grade fevers.  The patient felt cold. Hematocrit was 24.2, hemoglobin 7.5, platelets 452,000, WBC 12,400 with an [place] of 8800.  [name] was held.  She underwent a fever work-up.  She was scheduled for transfusion the following day.  She returned to clinic on 12/17/2018.  She had taken off her band for transfusion.  Decision was made to refer her to the ER for transfusion.   She was admitted to [facility] from 12/17/2018 - 12/18/2018.  She received 1 unit of PRBCs.  CBC at discharge included a hematocrit of 29.0, hemoglobin 9.4, platelets 364,000, and WBC 10,200.  During the interim, she "feels low" today. She is unsure how much she is urinating, eating, or drinking. She does not remember being in the hospital. She has been sleeping a lot. Her daughter, Laura Wilcox, notes that being in the hospital was a very anxiety inducing experience for her.   At this time, she does not have a primary care physician, but they are looking for a geriatrician.  I asked her a series of questions to evaluate her memory function. She was unable to name the location that she was in. She was able to remember that she was here to see Dr. Mike Gip, but she did not recognize  that I was Dr. Mike Gip. She thought it was August, 20 "something," but could not remember the exact year. She does not remember the current president. She was asked to remember three words today.  She was able to recall the words immediately, but unable to remember the words in 5 minutes.    Past Medical History:  Diagnosis Date   Polycythemia     Past Surgical History:  Procedure Laterality Date   ABDOMINAL HYSTERECTOMY     complete   BREAST SURGERY      Family History  Problem Relation Age of Onset   Heart disease Father    Cancer Sister        breast   Cancer Sister        breast   Alzheimer's disease Sister     Social History:  reports that she quit smoking about 40 years ago. She has a 30.00 pack-year smoking history. She has never used smokeless tobacco. She reports that she does not drink alcohol or use drugs. Former smoker (1/2 pack/day) from age 25s to 40s. She quit smoking in 1980. She occasionally drinks a glass of wine. She is widowed. Patient is retired Sport and exercise psychologist. Patient moved to [facility] in 03/2017. She lives independently in her own apartment in [place]. Her daughter lives next door.Patient denies known exposures to radiation on toxins.  Pt's daughter, Laura Wilcox, can be reached at [phone]. The patient is accompanied  by her daughter, via video call today.  Allergies: No Known Allergies  Current Medications: Current Outpatient Medications  Medication Sig Dispense Refill   feeding supplement, ENSURE ENLIVE, (ENSURE ENLIVE) LIQD Take 237 mLs by mouth 2 (two) times daily between meals. (Patient not taking: Reported on 12/22/2018) 237 mL 12   Multiple Vitamin (MULTIVITAMIN WITH MINERALS) TABS tablet Take 1 tablet by mouth daily. (Patient not taking: Reported on 12/22/2018) 30 tablet 1   No current facility-administered medications for this visit.     Review of Systems  Constitutional: Positive for malaise/fatigue (sleeping most of the day). Negative for chills,  diaphoresis, fever and weight loss.       "I think I am ok".  Sleeps 22 hours/day.  HENT: Positive for hearing loss. Negative for congestion, ear discharge, ear pain, nosebleeds, sinus pain and sore throat.   Eyes: Negative.  Negative for blurred vision, double vision, photophobia and pain.  Respiratory: Negative.  Negative for cough, hemoptysis, sputum production and shortness of breath.   Cardiovascular: Negative.  Negative for chest pain, palpitations, orthopnea, leg swelling and PND.  Gastrointestinal: Negative.  Negative for abdominal pain, blood in stool, constipation, diarrhea, heartburn, melena, nausea and vomiting.  Genitourinary: Negative.  Negative for dysuria, frequency and hematuria.  Musculoskeletal: Negative.  Negative for back pain, falls, joint pain, myalgias and neck pain.  Skin: Negative.  Negative for itching and rash.  Neurological: Negative.  Negative for dizziness, tingling, tremors, sensory change, speech change, focal weakness, seizures, weakness and headaches.  Endo/Heme/Allergies: Negative.  Does not bruise/bleed easily.  Psychiatric/Behavioral: Positive for memory loss (unable to remember recent hospital visit or book she is reading). Negative for depression. The patient is nervous/anxious. The patient does not have insomnia.        Increased irritation  All other systems reviewed and are negative.  Performance status (ECOG):  2-3  Vitals Temperature 97.6 F (36.4 C), temperature source Tympanic, resp. rate 18, weight 110 lb 1.6 oz (49.9 kg), SpO2 95 %.   Physical Exam  Constitutional: She is oriented to person, place, and time. She appears well-developed and well-nourished. No distress.  Thin woman sitting comfortably at home in no acute distress.  She is examined in her chair.  HENT:  Head: Normocephalic and atraumatic.  Mouth/Throat: Oropharynx is clear and moist. No oropharyngeal exudate.  Short dark blonde hair.  Mask.  Eyes: Pupils are equal, round, and  reactive to light. Conjunctivae and EOM are normal. No scleral icterus.  Blue eyes.  Neck: Normal range of motion. Neck supple. No JVD present.  Cardiovascular: Normal rate and regular rhythm. Exam reveals no gallop and no friction rub.  Pulmonary/Chest: Effort normal and breath sounds normal. She has no wheezes. She has no rales. She exhibits no tenderness.  Abdominal: Soft. Bowel sounds are normal. She exhibits no distension and no mass. There is no abdominal tenderness. There is no rebound and no guarding.  Musculoskeletal: Normal range of motion.        General: No edema.  Lymphadenopathy:    She has no cervical adenopathy.  Neurological: She is alert and oriented to person, place, and time.  Skin: Skin is warm and dry. No rash noted. She is not diaphoretic. No erythema. No pallor.  Psychiatric: She has a normal mood and affect. Her behavior is normal. Judgment normal.  Nursing note and vitals reviewed.   Appointment on 12/23/2018  Component Date Value Ref Range Status   Sodium 12/23/2018 137  135 - 145 mmol/L Final  Potassium 12/23/2018 4.0  3.5 - 5.1 mmol/L Final   Chloride 12/23/2018 102  98 - 111 mmol/L Final   CO2 12/23/2018 25  22 - 32 mmol/L Final   Glucose, Bld 12/23/2018 PENDING  70 - 99 mg/dL Incomplete   BUN 12/23/2018 23  8 - 23 mg/dL Final   Creatinine, Ser 12/23/2018 1.53* 0.44 - 1.00 mg/dL Final   Calcium 12/23/2018 9.1  8.9 - 10.3 mg/dL Final   Total Protein 12/23/2018 7.1  6.5 - 8.1 g/dL Final   Albumin 12/23/2018 3.4* 3.5 - 5.0 g/dL Final   AST 12/23/2018 18  15 - 41 U/L Final   ALT 12/23/2018 13  0 - 44 U/L Final   Alkaline Phosphatase 12/23/2018 76  38 - 126 U/L Final   Total Bilirubin 12/23/2018 0.6  0.3 - 1.2 mg/dL Final   GFR calc non Af Amer 12/23/2018 30* >60 mL/min Final   GFR calc Af Amer 12/23/2018 34* >60 mL/min Final   Anion gap 12/23/2018 10  5 - 15 Final   Performed at [facility], [address], [place],  [place] [address]   WBC 12/23/2018 9.3  4.0 - 10.5 K/uL Final   RBC 12/23/2018 3.32* 3.87 - 5.11 MIL/uL Final   Hemoglobin 12/23/2018 9.8* 12.0 - 15.0 g/dL Final   HCT 12/23/2018 31.6* 36.0 - 46.0 % Final   MCV 12/23/2018 95.2  80.0 - 100.0 fL Final   MCH 12/23/2018 29.5  26.0 - 34.0 pg Final   MCHC 12/23/2018 31.0  30.0 - 36.0 g/dL Final   RDW 12/23/2018 19.4* 11.5 - 15.5 % Final   Platelets 12/23/2018 338  150 - 400 K/uL Final   nRBC 12/23/2018 0.2  0.0 - 0.2 % Final   Neutrophils Relative % 12/23/2018 67  % Final   Neutro Abs 12/23/2018 6.2  1.7 - 7.7 K/uL Final   Lymphocytes Relative 12/23/2018 12  % Final   Lymphs Abs 12/23/2018 1.1  0.7 - 4.0 K/uL Final   Monocytes Relative 12/23/2018 9  % Final   Monocytes Absolute 12/23/2018 0.9  0.1 - 1.0 K/uL Final   Eosinophils Relative 12/23/2018 5  % Final   Eosinophils Absolute 12/23/2018 0.5  0.0 - 0.5 K/uL Final   Basophils Relative 12/23/2018 1  % Final   Basophils Absolute 12/23/2018 0.1  0.0 - 0.1 K/uL Final   Immature Granulocytes 12/23/2018 6  % Final   Abs Immature Granulocytes 12/23/2018 0.52* 0.00 - 0.07 K/uL Final   Performed at [facility], [address] [place], [place] [address]    Assessment:  Aniesa Boback is a 85 y.o. female with polycythemia rubra veraand secondary myelofibrosis. She presented in 04/2002 with thrombocytosis (835,000). JAK2 V617Fwas positive on 05/20/2012.  Over the past year,hematocrithas ranged 37.5 - 42.4,hemoglobin12.1 - 13.4, platelets458,000 - 569,000, WBC10,500 - 15,700. Creatinine has been 1.0 - 1.2.  Bone marrowon 04/15/2002 revealed a normocellular marrow for age with trilineage hematopoiesis and mild megakaryocytic hyperplasia with focal atypia. There was no morphologic evidence of a myeloproliferative disorder. Iron stains were inadequate for evaluation of storage iron. There was no increase in reticulin fibers. She has been on agrylin since  2012.  Bone marrowon 09/25/2012 revealed a persistent myeloproliferative disorder s/p Agrylin. The current features were best classified as persistent myeloproliferative neoplasm (MPN) JAK2 V617F mutation positive. Marrow was hypercellular for age (30-50%) with increased trilineage hematopoiesis and markedly increased atypical megakaryopoiesis with frequent clustering. There were no increased blasts. Iron stores were  absent. There was moderate to severe myelofibrosis(grade MF 2-3). Differential included ET, PV, primary myelofibrosis, and MPN, unclassifiable. Cytogeneticswere normal (37, XX).  She developed pancytopeniain 12/2015. Etiology is unclear (medication confusion or hydroxyurea + Agrylin).  She received Procrit40,000 units (intermittently 09/2012 - 06/2016), Venofer(09/2012 - 10/2012), and Infed(12/2015 - 03/2016). She received Neupogenduring a period of neutropenia. She was anagrelidefrom 2004 - 08/13/2017. She beganJakafion 08/14/2017. Jakafi was increased from 5 mg BID to 10 mg BID on03/18/2020.Jakafi was increased to 15 mg BID on 08/03/2018.  She began a phlebotomy programon 08/06/2017. She undergoes small volume (150 cc with replacement of 150 cc NS)if her HCT >42.  Ferritinhas been followed: 529 on 09/03/2017, 1020 on 12/03/2017, 423 on 02/18/2018, and 858 on 11/17/2018.  Iron saturation was 37% and TIBC 270 on 11/17/2018.  B12 was 477 and folate 36 on 11/17/2018.  She was admitted to [facility] from 12/17/2018 - 12/18/2018.  She received 1 unit of PRBCs.  CBC at discharge included a hematocrit of 29.0, hemoglobin 9.4, platelets 364,000, and WBC 10,200.  Symptomatically, she is sleeping most of the day.  Blood pressure is low today.  Memory is poor.  Exam reveals no adenopathy or hepatosplenomegaly.  Plan: 1.Labs today:  CBC with diff, CMP, hold tube. 2.Polycythemia rubra vera Hematocrit 31.6.  Hemoglobin9.8.  Platelets 338,000. Patient received 1  unit of PRBCs last week for a hemoglobin 7.7. Continue to hold Jakafi. Discuss close monitoring. 3.Low grade fever, resolved             Fever work-up was negative at last visit (UA and culture, blood cultures, CXR).  4.   Confusion  Concern for possible underlying dementia.  Head CT on 12/17/2018 was negative during hospitalization.  Discuss neurology consult.   Patient's daughter to stay with her at home.     5.   Renal insufficiency BUN19and creatinine 1.26. Patient does not drink unless encouraged.   Continue to monitor. 6.    Hypotension  IVF today.  RTC tomorrow early for labs (BMP, cortisol level), orthostatics +/- IVF. 7.    Nutrition consult. 8.    Referral to primary care physician. 9.    Neurology consult. 10.   RTC in 1 week for MD assessment, labs (CBC with diff, BMP), and +/- IVF.  I discussed the assessment and treatment plan with the patient.  The patient was provided an opportunity to ask questions and all were answered.  The patient agreed with the plan and demonstrated an understanding of the instructions.  The patient was advised to call back if the symptoms worsen or if the condition fails to improve as anticipated.   I provided 28 minutes (10:40 AM - 11:08) of face-to-face time during this this encounter and > 50% was spent counseling as documented under my assessment and plan.    Lequita Asal, MD, PhD    12/23/2018, 10:40 AM  I, Jacqualyn Posey, am acting as Education administrator for [employer]. Mike Gip, MD, PhD.  I, Janella Rogala C. Mike Gip, MD, have reviewed the above documentation for accuracy and completeness, and I agree with the above.

## 2018-12-23 ENCOUNTER — Other Ambulatory Visit: Payer: Self-pay

## 2018-12-23 ENCOUNTER — Telehealth: Payer: Self-pay

## 2018-12-23 ENCOUNTER — Inpatient Hospital Stay: Payer: Medicare Other

## 2018-12-23 ENCOUNTER — Inpatient Hospital Stay (HOSPITAL_BASED_OUTPATIENT_CLINIC_OR_DEPARTMENT_OTHER): Payer: Medicare Other | Admitting: Hematology and Oncology

## 2018-12-23 VITALS — Temp 97.6°F | Resp 18 | Wt 110.1 lb

## 2018-12-23 VITALS — BP 95/66 | HR 84 | Temp 97.1°F | Resp 16

## 2018-12-23 DIAGNOSIS — E860 Dehydration: Secondary | ICD-10-CM | POA: Diagnosis not present

## 2018-12-23 DIAGNOSIS — R410 Disorientation, unspecified: Secondary | ICD-10-CM | POA: Diagnosis not present

## 2018-12-23 DIAGNOSIS — R634 Abnormal weight loss: Secondary | ICD-10-CM | POA: Diagnosis not present

## 2018-12-23 DIAGNOSIS — D45 Polycythemia vera: Secondary | ICD-10-CM | POA: Diagnosis not present

## 2018-12-23 DIAGNOSIS — R5383 Other fatigue: Secondary | ICD-10-CM | POA: Diagnosis not present

## 2018-12-23 DIAGNOSIS — R509 Fever, unspecified: Secondary | ICD-10-CM | POA: Diagnosis not present

## 2018-12-23 DIAGNOSIS — D7581 Myelofibrosis: Secondary | ICD-10-CM | POA: Diagnosis not present

## 2018-12-23 DIAGNOSIS — N289 Disorder of kidney and ureter, unspecified: Secondary | ICD-10-CM | POA: Diagnosis not present

## 2018-12-23 DIAGNOSIS — Z7189 Other specified counseling: Secondary | ICD-10-CM | POA: Diagnosis not present

## 2018-12-23 DIAGNOSIS — R531 Weakness: Secondary | ICD-10-CM | POA: Diagnosis not present

## 2018-12-23 LAB — COMPREHENSIVE METABOLIC PANEL
ALT: 13 U/L (ref 0–44)
AST: 18 U/L (ref 15–41)
Albumin: 3.4 g/dL — ABNORMAL LOW (ref 3.5–5.0)
Alkaline Phosphatase: 76 U/L (ref 38–126)
Anion gap: 10 (ref 5–15)
BUN: 23 mg/dL (ref 8–23)
CO2: 25 mmol/L (ref 22–32)
Calcium: 9.1 mg/dL (ref 8.9–10.3)
Chloride: 102 mmol/L (ref 98–111)
Creatinine, Ser: 1.53 mg/dL — ABNORMAL HIGH (ref 0.44–1.00)
GFR calc Af Amer: 34 mL/min — ABNORMAL LOW (ref >60)
GFR calc non Af Amer: 30 mL/min — ABNORMAL LOW (ref >60)
Glucose, Bld: 105 mg/dL — ABNORMAL HIGH (ref 70–99)
Potassium: 4 mmol/L (ref 3.5–5.1)
Sodium: 137 mmol/L (ref 135–145)
Total Bilirubin: 0.6 mg/dL (ref 0.3–1.2)
Total Protein: 7.1 g/dL (ref 6.5–8.1)

## 2018-12-23 LAB — SAMPLE TO BLOOD BANK

## 2018-12-23 LAB — CBC WITH DIFFERENTIAL/PLATELET
Abs Immature Granulocytes: 0.52 10*3/uL — ABNORMAL HIGH (ref 0.00–0.07)
Basophils Absolute: 0.1 10*3/uL (ref 0.0–0.1)
Basophils Relative: 1 %
Eosinophils Absolute: 0.5 10*3/uL (ref 0.0–0.5)
Eosinophils Relative: 5 %
HCT: 31.6 % — ABNORMAL LOW (ref 36.0–46.0)
Hemoglobin: 9.8 g/dL — ABNORMAL LOW (ref 12.0–15.0)
Immature Granulocytes: 6 %
Lymphocytes Relative: 12 %
Lymphs Abs: 1.1 10*3/uL (ref 0.7–4.0)
MCH: 29.5 pg (ref 26.0–34.0)
MCHC: 31 g/dL (ref 30.0–36.0)
MCV: 95.2 fL (ref 80.0–100.0)
Monocytes Absolute: 0.9 10*3/uL (ref 0.1–1.0)
Monocytes Relative: 9 %
Neutro Abs: 6.2 10*3/uL (ref 1.7–7.7)
Neutrophils Relative %: 67 %
Platelets: 338 10*3/uL (ref 150–400)
RBC: 3.32 MIL/uL — ABNORMAL LOW (ref 3.87–5.11)
RDW: 19.4 % — ABNORMAL HIGH (ref 11.5–15.5)
WBC: 9.3 10*3/uL (ref 4.0–10.5)
nRBC: 0.2 % (ref 0.0–0.2)

## 2018-12-23 MED ORDER — SODIUM CHLORIDE 0.9 % IV SOLN
Freq: Once | INTRAVENOUS | Status: AC
  Administered 2018-12-23: 11:00:00 via INTRAVENOUS
  Filled 2018-12-23: qty 250

## 2018-12-23 NOTE — Progress Notes (Signed)
Patient received I liter of NS. Patient was offered water and food. Denied wanting anything. Gave patient water, put cup in her hand and she drank all of it. Patient has dementia. Taken to  BR via W/C w/RN  Assisatance. Voided large amount of urine prior to discharge.

## 2018-12-23 NOTE — Telephone Encounter (Signed)
In Basket sent to [facility] requesting for him to reach out to patient's daughter regarding patient needs.

## 2018-12-23 NOTE — Progress Notes (Signed)
Patient here for follow up. Reports weakness and fatigue, states it is better. Also reports "trembling" with standing.

## 2018-12-24 ENCOUNTER — Inpatient Hospital Stay: Payer: Medicare Other

## 2018-12-24 VITALS — BP 92/63 | HR 80 | Temp 97.4°F | Resp 18

## 2018-12-24 DIAGNOSIS — R410 Disorientation, unspecified: Secondary | ICD-10-CM | POA: Diagnosis not present

## 2018-12-24 DIAGNOSIS — E860 Dehydration: Secondary | ICD-10-CM

## 2018-12-24 DIAGNOSIS — R509 Fever, unspecified: Secondary | ICD-10-CM | POA: Diagnosis not present

## 2018-12-24 DIAGNOSIS — D45 Polycythemia vera: Secondary | ICD-10-CM

## 2018-12-24 DIAGNOSIS — R5383 Other fatigue: Secondary | ICD-10-CM | POA: Diagnosis not present

## 2018-12-24 DIAGNOSIS — R531 Weakness: Secondary | ICD-10-CM | POA: Diagnosis not present

## 2018-12-24 DIAGNOSIS — Z7189 Other specified counseling: Secondary | ICD-10-CM

## 2018-12-24 DIAGNOSIS — D7581 Myelofibrosis: Secondary | ICD-10-CM | POA: Diagnosis not present

## 2018-12-24 DIAGNOSIS — N289 Disorder of kidney and ureter, unspecified: Secondary | ICD-10-CM

## 2018-12-24 LAB — BASIC METABOLIC PANEL
Anion gap: 8 (ref 5–15)
BUN: 19 mg/dL (ref 8–23)
CO2: 23 mmol/L (ref 22–32)
Calcium: 8.5 mg/dL — ABNORMAL LOW (ref 8.9–10.3)
Chloride: 106 mmol/L (ref 98–111)
Creatinine, Ser: 1.26 mg/dL — ABNORMAL HIGH (ref 0.44–1.00)
GFR calc Af Amer: 43 mL/min — ABNORMAL LOW (ref >60)
GFR calc non Af Amer: 37 mL/min — ABNORMAL LOW (ref >60)
Glucose, Bld: 146 mg/dL — ABNORMAL HIGH (ref 70–99)
Potassium: 4.3 mmol/L (ref 3.5–5.1)
Sodium: 137 mmol/L (ref 135–145)

## 2018-12-24 LAB — CORTISOL: Cortisol, Plasma: 20.9 ug/dL

## 2018-12-24 MED ORDER — SODIUM CHLORIDE 0.9 % IV SOLN
Freq: Once | INTRAVENOUS | Status: AC
  Administered 2018-12-24: 09:00:00 via INTRAVENOUS
  Filled 2018-12-24: qty 250

## 2018-12-24 NOTE — Patient Instructions (Signed)
Educated regarding dehydration. Patient needs to drink 6-8 glasses of water per day. I mentioned to daughter that when patient is asked if she would like something to drink she says no. But if I put the cup in her hand she will drink it.

## 2018-12-24 NOTE — Progress Notes (Signed)
PSN spoke, at length, with patient's daughter, Arrie Aran, about resources to assist patient in the home.

## 2018-12-24 NOTE — Progress Notes (Signed)
Hypotensive again today. Per MD order will give another liter of NS. BP 71/58 sitting. BP 77/57 standing. Patient alert, dementia. She is oriented to self. Denies any pain or dizziness. At completion of fluids BP 93/62. Patient states"  I feel so much better". Discharged via wheelchair . Assisted patient into daughter's car. Daughter states she spent the night at her mothers apartment last night.

## 2018-12-29 ENCOUNTER — Encounter: Payer: Self-pay | Admitting: Hematology and Oncology

## 2018-12-29 NOTE — Progress Notes (Signed)
Laura Wilcox did report her mother has been c/o burning with urination. The patient Name and DOB has been verified by phone.

## 2018-12-30 ENCOUNTER — Other Ambulatory Visit: Payer: Self-pay

## 2018-12-30 ENCOUNTER — Encounter: Payer: Self-pay | Admitting: Hematology and Oncology

## 2018-12-30 ENCOUNTER — Inpatient Hospital Stay: Payer: Medicare Other

## 2018-12-30 ENCOUNTER — Inpatient Hospital Stay (HOSPITAL_BASED_OUTPATIENT_CLINIC_OR_DEPARTMENT_OTHER): Payer: Medicare Other | Admitting: Hematology and Oncology

## 2018-12-30 VITALS — BP 127/74 | HR 83 | Temp 96.9°F | Resp 16 | Wt 110.2 lb

## 2018-12-30 DIAGNOSIS — R410 Disorientation, unspecified: Secondary | ICD-10-CM | POA: Diagnosis not present

## 2018-12-30 DIAGNOSIS — N289 Disorder of kidney and ureter, unspecified: Secondary | ICD-10-CM

## 2018-12-30 DIAGNOSIS — Z7189 Other specified counseling: Secondary | ICD-10-CM

## 2018-12-30 DIAGNOSIS — R531 Weakness: Secondary | ICD-10-CM | POA: Diagnosis not present

## 2018-12-30 DIAGNOSIS — D7581 Myelofibrosis: Secondary | ICD-10-CM | POA: Diagnosis not present

## 2018-12-30 DIAGNOSIS — I959 Hypotension, unspecified: Secondary | ICD-10-CM

## 2018-12-30 DIAGNOSIS — D45 Polycythemia vera: Secondary | ICD-10-CM

## 2018-12-30 DIAGNOSIS — E860 Dehydration: Secondary | ICD-10-CM

## 2018-12-30 DIAGNOSIS — R300 Dysuria: Secondary | ICD-10-CM

## 2018-12-30 DIAGNOSIS — R509 Fever, unspecified: Secondary | ICD-10-CM | POA: Diagnosis not present

## 2018-12-30 DIAGNOSIS — R5383 Other fatigue: Secondary | ICD-10-CM | POA: Diagnosis not present

## 2018-12-30 HISTORY — DX: Disorientation, unspecified: R41.0

## 2018-12-30 LAB — CBC WITH DIFFERENTIAL/PLATELET
Abs Immature Granulocytes: 0.3 10*3/uL — ABNORMAL HIGH (ref 0.00–0.07)
Band Neutrophils: 2 %
Basophils Absolute: 0.2 10*3/uL — ABNORMAL HIGH (ref 0.0–0.1)
Basophils Relative: 2 %
Eosinophils Absolute: 0.3 10*3/uL (ref 0.0–0.5)
Eosinophils Relative: 3 %
HCT: 30.3 % — ABNORMAL LOW (ref 36.0–46.0)
Hemoglobin: 9.5 g/dL — ABNORMAL LOW (ref 12.0–15.0)
Lymphocytes Relative: 10 %
Lymphs Abs: 1.1 10*3/uL (ref 0.7–4.0)
MCH: 30.2 pg (ref 26.0–34.0)
MCHC: 31.4 g/dL (ref 30.0–36.0)
MCV: 96.2 fL (ref 80.0–100.0)
Monocytes Absolute: 0.5 10*3/uL (ref 0.1–1.0)
Monocytes Relative: 5 %
Myelocytes: 3 %
Neutro Abs: 8.4 10*3/uL — ABNORMAL HIGH (ref 1.7–7.7)
Neutrophils Relative %: 75 %
Platelets: 391 10*3/uL (ref 150–400)
RBC: 3.15 MIL/uL — ABNORMAL LOW (ref 3.87–5.11)
RDW: 20.7 % — ABNORMAL HIGH (ref 11.5–15.5)
WBC: 10.9 10*3/uL — ABNORMAL HIGH (ref 4.0–10.5)
nRBC: 0.4 % — ABNORMAL HIGH (ref 0.0–0.2)

## 2018-12-30 LAB — BASIC METABOLIC PANEL
Anion gap: 11 (ref 5–15)
BUN: 23 mg/dL (ref 8–23)
CO2: 24 mmol/L (ref 22–32)
Calcium: 9.1 mg/dL (ref 8.9–10.3)
Chloride: 101 mmol/L (ref 98–111)
Creatinine, Ser: 1.11 mg/dL — ABNORMAL HIGH (ref 0.44–1.00)
GFR calc Af Amer: 51 mL/min — ABNORMAL LOW (ref >60)
GFR calc non Af Amer: 44 mL/min — ABNORMAL LOW (ref >60)
Glucose, Bld: 105 mg/dL — ABNORMAL HIGH (ref 70–99)
Potassium: 3.8 mmol/L (ref 3.5–5.1)
Sodium: 136 mmol/L (ref 135–145)

## 2018-12-30 LAB — URINALYSIS, COMPLETE (UACMP) WITH MICROSCOPIC
Bilirubin Urine: NEGATIVE
Glucose, UA: NEGATIVE mg/dL
Hgb urine dipstick: NEGATIVE
Leukocytes,Ua: NEGATIVE
Nitrite: NEGATIVE
RBC / HPF: NONE SEEN RBC/hpf (ref 0–5)
Specific Gravity, Urine: 1.02 (ref 1.005–1.030)
pH: 5.5 (ref 5.0–8.0)

## 2018-12-30 NOTE — Progress Notes (Signed)
  Pt daughter called and reports that pt burning with urination.

## 2018-12-30 NOTE — Progress Notes (Signed)
[facility]  [address] [place], [place] [address] Phone: [phone]  Fax: [phone]   Clinic Day:  12/30/2018  Referring physician: No ref. provider found  Chief Complaint: Laura Wilcox is a 85 y.o. female with polycythemia rubra vera (PV) on Jakafi who is seen for 1 week assessment   HPI: The patient was last seen in the hematology clinic on 12/23/2018. At that time, she "felt low." She was unsure how much she was urinating, eating, or drinking. She did not remember being in the hospital.  Memory was poor.  She was sleeping 22 hours/day.  Blood pressure was low.  Hematocrit was 31.6, hemoglobin 9.8, MCV 95.2, platelets 338,000, WBC 3320. Creatinine was 1.53. She remained off Jakafi.  She was referred to the social worker, nutrition, neurology, and primary care.  She received IVF on 12/23/2018 and 12/24/2018.  Creatinine was 1.26 on 12/24/2018.  Cortisol level was 20.9 (normal).  Her daughter spoke to Elease Etienne, Education officer, museum, on 12/24/2018 regarding resources to assist the patient in her home.  Hematocrit 30.3, hemoglobin 9.5, MCV 96.2, platelets 391,000, WBC 10,900.   During the interim, the social worker has been a big help to them. Daughter believes her mother is improving. She has more energy especially in the evening. She will be seeing PA/NP who works for Dr. Melrose Nakayama. She is sleeping 18 hours a day (down from 22 hours). This week was able to sit in living room for 30 minutes today and yesterday. Her daughter is relieved and sees small improvement in her mentation.  She has not been dizziness or lightheaded. She had 2 episodes of moaning while sitting on the toilet; the patient doesn't remember. Her daughter would like her urine tested.   Past Medical History:  Diagnosis Date  . Polycythemia     Past Surgical History:  Procedure Laterality Date  . ABDOMINAL HYSTERECTOMY     complete  . BREAST SURGERY      Family History   Problem Relation Age of Onset  . Heart disease Father   . Cancer Sister        breast  . Cancer Sister        breast  . Alzheimer's disease Sister     Social History:  reports that she quit smoking about 40 years ago. She has a 30.00 pack-year smoking history. She has never used smokeless tobacco. She reports that she does not drink alcohol or use drugs. Former smoker (1/2 pack/day) from age 55s to 35s. She quit smoking in 1980. She occasionally drinks a glass of wine. She is widowed. Patient is retired Sport and exercise psychologist. Patient moved to [place] [facility] in 03/2017. She lives independently in her own apartment in [place]. Her daughter lives next door.Patient denies known exposures to radiation on toxins.Pt's daughter, Laura Wilcox, can be reached at [phone]. The patient is accompanied by daughter Laura Wilcox today. Called by phone  Allergies: No Known Allergies  Current Medications: Current Outpatient Medications  Medication Sig Dispense Refill  . feeding supplement, ENSURE ENLIVE, (ENSURE ENLIVE) LIQD Take 237 mLs by mouth 2 (two) times daily between meals. 237 mL 12  . Multiple Vitamin (MULTIVITAMIN WITH MINERALS) TABS tablet Take 1 tablet by mouth daily. 30 tablet 1   No current facility-administered medications for this visit.     Review of Systems  Constitutional: Positive for malaise/fatigue (improving). Negative for chills, diaphoresis, fever and weight loss.       Doing better.  Sleeps 18 hours/day.  HENT: Positive for hearing  loss. Negative for congestion, ear discharge, nosebleeds, sinus pain and sore throat.   Eyes: Negative.  Negative for blurred vision, double vision, photophobia and pain.  Respiratory: Negative.  Negative for cough, hemoptysis, sputum production and shortness of breath.   Cardiovascular: Negative.  Negative for chest pain, palpitations, orthopnea, leg swelling and PND.  Gastrointestinal: Negative.  Negative for abdominal pain, blood in stool, constipation, diarrhea, heartburn,  melena, nausea and vomiting.  Genitourinary: Positive for dysuria (2 episodes of pain on toilet). Negative for frequency and hematuria.  Musculoskeletal: Negative.  Negative for back pain, falls, joint pain, myalgias and neck pain.  Skin: Negative.  Negative for itching and rash.  Neurological: Negative.  Negative for dizziness, tingling, tremors, sensory change, speech change, focal weakness, seizures, weakness and headaches.  Endo/Heme/Allergies: Negative.  Does not bruise/bleed easily.  Psychiatric/Behavioral: Positive for memory loss (improved slightly). Negative for depression. The patient is not nervous/anxious and does not have insomnia.   All other systems reviewed and are negative.  Performance status (ECOG): 2 - Symptomatic, <50% confined to bed  Vitals Blood pressure 93/73, pulse 83, temperature (!) 96.9 F (36.1 C), temperature source Tympanic, resp. rate 16, weight 110 lb 3.7 oz (50 kg), SpO2 96 %.   Physical Exam  Constitutional: She is oriented to person, place, and time. She appears well-developed and well-nourished. No distress.  Thin woman sitting comfortably at home in no acute distress.  She is examined in her chair.  HENT:  Head: Normocephalic and atraumatic.  Mouth/Throat: Oropharynx is clear and moist. No oropharyngeal exudate.  Short dark blonde hair.  Mask.  Eyes: Pupils are equal, round, and reactive to light. Conjunctivae and EOM are normal. No scleral icterus.  Blue eyes.  Neck: Normal range of motion. Neck supple. No JVD present.  Cardiovascular: Normal rate and normal heart sounds. Exam reveals no gallop and no friction rub.  No murmur heard. Pulmonary/Chest: Effort normal and breath sounds normal. She has no wheezes. She has no rales. She exhibits no tenderness.  Abdominal: Soft. Bowel sounds are normal. She exhibits no distension and no mass. There is no abdominal tenderness. There is no rebound and no guarding.  Musculoskeletal: Normal range of motion.         General: No edema.  Lymphadenopathy:    She has no cervical adenopathy.  Neurological: She is alert and oriented to person, place, and time.  Remembers president.  Immediate recall, but unable to remember 3 words in 5 minutes.  Skin: Skin is warm and dry. No rash noted. She is not diaphoretic. No erythema. No pallor.  Psychiatric: She has a normal mood and affect. Her behavior is normal.  Nursing note and vitals reviewed.   Appointment on 12/30/2018  Component Date Value Ref Range Status  . Sodium 12/30/2018 136  135 - 145 mmol/L Final  . Potassium 12/30/2018 3.8  3.5 - 5.1 mmol/L Final  . Chloride 12/30/2018 101  98 - 111 mmol/L Final  . CO2 12/30/2018 24  22 - 32 mmol/L Final  . Glucose, Bld 12/30/2018 105* 70 - 99 mg/dL Final  . BUN 12/30/2018 23  8 - 23 mg/dL Final  . Creatinine, Ser 12/30/2018 1.11* 0.44 - 1.00 mg/dL Final  . Calcium 12/30/2018 9.1  8.9 - 10.3 mg/dL Final  . GFR calc non Af Amer 12/30/2018 44* >60 mL/min Final  . GFR calc Af Amer 12/30/2018 51* >60 mL/min Final  . Anion gap 12/30/2018 11  5 - 15 Final   Performed at  [facility], [address], [place], [place] [address]  . WBC 12/30/2018 10.9* 4.0 - 10.5 K/uL Final  . RBC 12/30/2018 3.15* 3.87 - 5.11 MIL/uL Final  . Hemoglobin 12/30/2018 9.5* 12.0 - 15.0 g/dL Final  . HCT 12/30/2018 30.3* 36.0 - 46.0 % Final  . MCV 12/30/2018 96.2  80.0 - 100.0 fL Final  . MCH 12/30/2018 30.2  26.0 - 34.0 pg Final  . MCHC 12/30/2018 31.4  30.0 - 36.0 g/dL Final  . RDW 12/30/2018 20.7* 11.5 - 15.5 % Final  . Platelets 12/30/2018 391  150 - 400 K/uL Final  . nRBC 12/30/2018 0.4* 0.0 - 0.2 % Final  . Neutrophils Relative % 12/30/2018 75  % Final  . Neutro Abs 12/30/2018 8.4* 1.7 - 7.7 K/uL Final  . Band Neutrophils 12/30/2018 2  % Final  . Lymphocytes Relative 12/30/2018 10  % Final  . Lymphs Abs 12/30/2018 1.1  0.7 - 4.0 K/uL Final  . Monocytes Relative 12/30/2018 5  % Final  . Monocytes Absolute  12/30/2018 0.5  0.1 - 1.0 K/uL Final  . Eosinophils Relative 12/30/2018 3  % Final  . Eosinophils Absolute 12/30/2018 0.3  0.0 - 0.5 K/uL Final  . Basophils Relative 12/30/2018 2  % Final  . Basophils Absolute 12/30/2018 0.2* 0.0 - 0.1 K/uL Final  . Myelocytes 12/30/2018 3  % Final  . Abs Immature Granulocytes 12/30/2018 0.30* 0.00 - 0.07 K/uL Final   Performed at [facility], [address] [place], [place] [address]    Assessment:  Markea Ruzich is a 85 y.o. female with polycythemia rubra veraand secondary myelofibrosis. She presented in 04/2002 with thrombocytosis (835,000). JAK2 V617Fwas positive on 05/20/2012.  Over the past year,hematocrithas ranged 37.5 - 42.4,hemoglobin12.1 - 13.4, platelets458,000 - 569,000, WBC10,500 - 15,700. Creatinine has been 1.0 - 1.2.  Bone marrowon 04/15/2002 revealed a normocellular marrow for age with trilineage hematopoiesis and mild megakaryocytic hyperplasia with focal atypia. There was no morphologic evidence of a myeloproliferative disorder. Iron stains were inadequate for evaluation of storage iron. There was no increase in reticulin fibers. She has been on agrylin since 2012.  Bone marrowon 09/25/2012 revealed a persistent myeloproliferative disorder s/p Agrylin. The current features were best classified as persistent myeloproliferative neoplasm (MPN) JAK2 V617F mutation positive. Marrow was hypercellular for age (30-50%) with increased trilineage hematopoiesis and markedly increased atypical megakaryopoiesis with frequent clustering. There were no increased blasts. Iron stores were absent. There was moderate to severe myelofibrosis(grade MF 2-3). Differential included ET, PV, primary myelofibrosis, and MPN, unclassifiable. Cytogeneticswere normal (44, XX).  She developed pancytopeniain 12/2015. Etiology is unclear (medication confusion or hydroxyurea + Agrylin).  She received Procrit40,000 units  (intermittently 09/2012 - 06/2016), Venofer(09/2012 - 10/2012), and Infed(12/2015 - 03/2016). She received Neupogenduring a period of neutropenia. She was anagrelidefrom 2004 - 08/13/2017. She beganJakafion 08/14/2017. Jakafi was increased from 5 mg BID to 10 mg BID on03/18/2020.Jakafi was increased to 15 mg BID on 08/03/2018.  She began a phlebotomy programon 08/06/2017. She undergoes small volume (150 cc with replacement of 150 cc NS)if her HCT >42.  Ferritinhas been followed: 529 on 09/03/2017, 1020 on 12/03/2017, 423 on 02/18/2018, and 858 on 11/17/2018.Iron saturationwas 37%and TIBC 270 on08/18/2020.B12 was 477 and folate 36 on 11/17/2018.  She was [facility] to [facility] from 12/17/2018 - 12/18/2018.  She received 1 unit of PRBCs.  CBC at discharge included a hematocrit of 29.0, hemoglobin 9.4, platelets 364,000, and WBC 10,200.   Symptomatically, she is slightly  better from last visit.  She is up 18 hours/day.  She may have dysuria.  Exam is stable.  Plan: 1.Labs today: CBC with diff, CMP. 2.Polycythemia rubra vera Hematocrit 30.3.Hemoglobin9.5.Platelets391,000. Platelet count goal < 400,000. Continue to hold Jakafi. Continue close monitoring. 3.   Confusion             Concern remains for underlying dementia.             Patient has appointment with neurology.  Patient's daughter able to stay with her. 4.Renal insufficiency BUN23and creatinine 1.11. Patient drinking fluids better.  Continue to monitor. 5.    Hypotension             Cortisol level was normal.  Check orthostatics.  No IVF needed today 6.    Possible dysuria  UA and culture.  Possible lower UTI. 7.   RTC in 2-3 weeks for MD assessment, labs (CBC with diff, CMP), and +/- IVF.   I discussed the assessment and treatment plan with the patient.  The patient was provided an opportunity to ask questions and all were answered.  The patient agreed with the plan and demonstrated an  understanding of the instructions.  The patient was advised to call back if the symptoms worsen or if the condition fails to improve as anticipated.  I provided 22 minutes (10:53 AM - 11:15 AM) of face-to-face time during this this encounter and > 50% was spent counseling as documented under my assessment and plan.    Lequita Asal, MD, PhD    12/30/2018, 10:53 AM  I, Samul Dada, am acting as a scribe for Lequita Asal, MD.  I, [place] Mike Gip, MD, have reviewed the above documentation for accuracy and completeness, and I agree with the above.

## 2019-01-02 LAB — URINE CULTURE: Culture: 30000 — AB

## 2019-01-04 ENCOUNTER — Inpatient Hospital Stay: Payer: Medicare Other | Attending: Hematology and Oncology

## 2019-01-04 ENCOUNTER — Telehealth: Payer: Self-pay

## 2019-01-04 ENCOUNTER — Other Ambulatory Visit: Payer: Self-pay | Admitting: Hematology and Oncology

## 2019-01-04 NOTE — Progress Notes (Signed)
Nutrition Assessment   Reason for Assessment:  Referral from Dr. Mike Gip   ASSESSMENT:  85 year old female with polycethemia rubra vera treated with Jakafi but recently stopped.  Noted recent hospital admissions with UTI, anemia, dehydration.  Noted poor memory.  Spoke with daughter [name] via phone for nutrition assessment.  Patient is sleeping at the time of call.  Daughter reports that patient has always "watched calories" and has been thin her entire life.  Daughter reports that she has been staying with mother to help prepare meals and take care of her.  Mother has been sleeping a lot and noted has UTI at present.  Daughter to go get antibiotic this pm.  Daughter reports that patient has not wanted to drink ensure/boost because they are too high calorie and she wants to throw them away.  Daughter has been trying to encourage more fluids, providing fruits to patient as well.      Nutrition Focused Physical Exam: deferred   Medications: MVI   Labs: glucose 105, creatinine 1.11  Anthropometrics:   Height:  65 inches Weight: 110 lb 3.7 oz on 9/30 UBW: 108 lb noted on 9/17, 112 lb on 03/2018 BMI: 18   Estimated Energy Needs  Kcals: 1500-1750 calories Protein: 75-88 g  Fluid: 1.7 L   NUTRITION DIAGNOSIS: Inadequate oral intake related to infection, hospitalization as evidenced by poor po intake, sleeping much of the day, dehdyration   INTERVENTION:  Encouraged daughter and patient to create schedule for eating and drinking and place on refrigerator or visible location so patient can see it.   Daughter reports patient is not a gravy person or adding dips, sauces to foods for additional calories due to restricting calories for so long.  Discussed options for hydration (fluids, soups, fruits, pedilyte, gatorade) Daughter wanted to call RD back once patient was awake.  Daughter called RD back this afternoon and RD has tried to call daughter back times 2 this pm to speak with  patient directly and have been unsuccessful in reaching patient Daughter has contact information   MONITORING, EVALUATION, GOAL: Patient will consume adequate calories and protein to prevent weight loss   Next Visit: to be determined  Yoselyn Mcglade B. Zenia Resides, [place], [place] Registered Dietitian [phone] (pager)

## 2019-01-04 NOTE — Telephone Encounter (Signed)
spoke with [name] to inform her that ( MoM tested positive for UTI) I will contact Trica back once Dr Mike Gip has sent the script to the pharmacy. Ms [name] does state that her was given Keflex in the past, but what ever Dr Mike Gip would like to try would be great.The patient has not tried Cipro in the past per her daughter.

## 2019-01-04 NOTE — Telephone Encounter (Signed)
-----   Message from Lequita Asal, MD sent at 01/04/2019  8:51 AM EDT ----- Regarding: Please call patient's daughter  Urinalysis reveals a UTI.  What antibiotic was she recently on (want to choose a different one)?  Has she been on cipro before?  M ----- Message ----- From: Buel Ream, Lab In [place] Sent: 12/30/2018  10:13 AM EDT To: Lequita Asal, MD

## 2019-01-05 ENCOUNTER — Other Ambulatory Visit: Payer: Self-pay

## 2019-01-05 ENCOUNTER — Inpatient Hospital Stay: Payer: Medicare Other | Attending: Hematology and Oncology

## 2019-01-05 ENCOUNTER — Other Ambulatory Visit: Payer: Self-pay | Admitting: Hematology and Oncology

## 2019-01-05 DIAGNOSIS — N3000 Acute cystitis without hematuria: Secondary | ICD-10-CM | POA: Diagnosis not present

## 2019-01-05 DIAGNOSIS — D649 Anemia, unspecified: Secondary | ICD-10-CM | POA: Insufficient documentation

## 2019-01-05 DIAGNOSIS — I959 Hypotension, unspecified: Secondary | ICD-10-CM | POA: Insufficient documentation

## 2019-01-05 DIAGNOSIS — R54 Age-related physical debility: Secondary | ICD-10-CM | POA: Insufficient documentation

## 2019-01-05 DIAGNOSIS — R9082 White matter disease, unspecified: Secondary | ICD-10-CM | POA: Insufficient documentation

## 2019-01-05 DIAGNOSIS — Z79899 Other long term (current) drug therapy: Secondary | ICD-10-CM | POA: Diagnosis not present

## 2019-01-05 DIAGNOSIS — Z803 Family history of malignant neoplasm of breast: Secondary | ICD-10-CM | POA: Diagnosis not present

## 2019-01-05 DIAGNOSIS — D45 Polycythemia vera: Secondary | ICD-10-CM | POA: Diagnosis not present

## 2019-01-05 DIAGNOSIS — Z818 Family history of other mental and behavioral disorders: Secondary | ICD-10-CM | POA: Insufficient documentation

## 2019-01-05 DIAGNOSIS — F0390 Unspecified dementia without behavioral disturbance: Secondary | ICD-10-CM | POA: Diagnosis not present

## 2019-01-05 DIAGNOSIS — Z8249 Family history of ischemic heart disease and other diseases of the circulatory system: Secondary | ICD-10-CM | POA: Diagnosis not present

## 2019-01-05 DIAGNOSIS — Z87891 Personal history of nicotine dependence: Secondary | ICD-10-CM | POA: Diagnosis not present

## 2019-01-05 DIAGNOSIS — I6782 Cerebral ischemia: Secondary | ICD-10-CM | POA: Insufficient documentation

## 2019-01-05 DIAGNOSIS — E860 Dehydration: Secondary | ICD-10-CM | POA: Insufficient documentation

## 2019-01-05 LAB — URINALYSIS, COMPLETE (UACMP) WITH MICROSCOPIC
Bilirubin Urine: NEGATIVE
Glucose, UA: NEGATIVE mg/dL
Hgb urine dipstick: NEGATIVE
Ketones, ur: NEGATIVE mg/dL
Nitrite: NEGATIVE
Protein, ur: NEGATIVE mg/dL
RBC / HPF: NONE SEEN RBC/hpf (ref 0–5)
Specific Gravity, Urine: 1.015 (ref 1.005–1.030)
WBC, UA: >50 WBC/hpf (ref 0–5)
pH: 7 (ref 5.0–8.0)

## 2019-01-06 LAB — GLUCOSE 6 PHOSPHATE DEHYDROGENASE
G6PDH: 15.4 U/g{Hb} (ref 4.8–15.7)
Hemoglobin: 9.5 g/dL — ABNORMAL LOW (ref 11.1–15.9)

## 2019-01-07 LAB — URINE CULTURE: Culture: >=100000 — AB

## 2019-01-11 ENCOUNTER — Telehealth: Payer: Self-pay

## 2019-01-11 ENCOUNTER — Other Ambulatory Visit: Payer: Self-pay

## 2019-01-11 ENCOUNTER — Telehealth: Payer: Self-pay | Admitting: Hematology and Oncology

## 2019-01-11 ENCOUNTER — Emergency Department
Admission: EM | Admit: 2019-01-11 | Discharge: 2019-01-11 | Disposition: A | Discharge status: Home / Self Care | Payer: Medicare Other | Attending: Emergency Medicine | Admitting: Emergency Medicine

## 2019-01-11 ENCOUNTER — Other Ambulatory Visit: Payer: Self-pay | Admitting: Hematology and Oncology

## 2019-01-11 DIAGNOSIS — I959 Hypotension, unspecified: Secondary | ICD-10-CM | POA: Diagnosis present

## 2019-01-11 DIAGNOSIS — Z5321 Procedure and treatment not carried out due to patient leaving prior to being seen by health care provider: Secondary | ICD-10-CM | POA: Insufficient documentation

## 2019-01-11 LAB — CBC
HCT: 32.7 % — ABNORMAL LOW (ref 36.0–46.0)
Hemoglobin: 10 g/dL — ABNORMAL LOW (ref 12.0–15.0)
MCH: 29.9 pg (ref 26.0–34.0)
MCHC: 30.6 g/dL (ref 30.0–36.0)
MCV: 97.6 fL (ref 80.0–100.0)
Platelets: 609 10*3/uL — ABNORMAL HIGH (ref 150–400)
RBC: 3.35 MIL/uL — ABNORMAL LOW (ref 3.87–5.11)
RDW: 22.5 % — ABNORMAL HIGH (ref 11.5–15.5)
WBC: 14.6 10*3/uL — ABNORMAL HIGH (ref 4.0–10.5)
nRBC: 0.2 % (ref 0.0–0.2)

## 2019-01-11 LAB — COMPREHENSIVE METABOLIC PANEL
ALT: 11 U/L (ref 0–44)
AST: 17 U/L (ref 15–41)
Albumin: 3.8 g/dL (ref 3.5–5.0)
Alkaline Phosphatase: 92 U/L (ref 38–126)
Anion gap: 9 (ref 5–15)
BUN: 20 mg/dL (ref 8–23)
CO2: 26 mmol/L (ref 22–32)
Calcium: 8.9 mg/dL (ref 8.9–10.3)
Chloride: 100 mmol/L (ref 98–111)
Creatinine, Ser: 1.21 mg/dL — ABNORMAL HIGH (ref 0.44–1.00)
GFR calc Af Amer: 46 mL/min — ABNORMAL LOW (ref >60)
GFR calc non Af Amer: 39 mL/min — ABNORMAL LOW (ref >60)
Glucose, Bld: 118 mg/dL — ABNORMAL HIGH (ref 70–99)
Potassium: 4.5 mmol/L (ref 3.5–5.1)
Sodium: 135 mmol/L (ref 135–145)
Total Bilirubin: 0.4 mg/dL (ref 0.3–1.2)
Total Protein: 7.1 g/dL (ref 6.5–8.1)

## 2019-01-11 LAB — URINALYSIS, COMPLETE (UACMP) WITH MICROSCOPIC
Bilirubin Urine: NEGATIVE
Glucose, UA: NEGATIVE mg/dL
Hgb urine dipstick: NEGATIVE
Ketones, ur: NEGATIVE mg/dL
Nitrite: NEGATIVE
Protein, ur: NEGATIVE mg/dL
Specific Gravity, Urine: 1.004 — ABNORMAL LOW (ref 1.005–1.030)
WBC, UA: >50 WBC/hpf — ABNORMAL HIGH (ref 0–5)
pH: 6 (ref 5.0–8.0)

## 2019-01-11 LAB — LACTIC ACID, PLASMA: Lactic Acid, Venous: 1.6 mmol/L (ref 0.5–1.9)

## 2019-01-11 MED ORDER — CEPHALEXIN 500 MG PO CAPS: 500.0000 mg | ORAL_CAPSULE | Freq: Two times a day (BID) | ORAL | 0 refills | Status: DC

## 2019-01-11 NOTE — Telephone Encounter (Signed)
-----   Message from Secundino Ginger sent at 01/11/2019  4:11 PM EDT ----- Regarding: Blood pressure [name]  called and gave me BP's This is a wrist monitor with patients sitting and resting. Left wrist 84/59, 73/51, 68/53 Right Wrist 125/70, 126/76

## 2019-01-11 NOTE — Telephone Encounter (Signed)
Spoke with [name], Patient's daughter, regarding BP readings. I informed her since the readings were low multiple times, Dr. Mike Gip felt it would be best for her to be evaluated in the ED. Daughter agreed and stated she would take her to the ED to be evaluated tonight.

## 2019-01-11 NOTE — Telephone Encounter (Signed)
Nutrition  Called daughter [name] this pm to try and connect with patient per daughter's request.  Was not able to connect with patient as scheduled on 10/5.    [name] reports that patient is back asleep still having a hard time with UTI.  Reports they are waiting to hear back from Dr. Mike Gip if they need another antibiotic or IV antibiotics.  [name] appreciative of call and will reach back out to RD at another time.    Nicki Furlan B. Laura Wilcox, [place], [place] Registered Dietitian [phone] (pager)

## 2019-01-11 NOTE — ED Triage Notes (Addendum)
Pt has been treated for UTI, family was called back today for bacteria in the urine per urine culture. Pt was started on antibiotics today (keflex), and daughter was told to check her bp. She brought patient here for hypotension and possible sepsis. Pt has no complaints, she does have dementia but has been increasingly confused. Daughter states bp on the left arm is always significantly lower then on the right arm.

## 2019-01-11 NOTE — Telephone Encounter (Signed)
Re:  UTI  Discussed last 2 urinalysis (12/30/2018 and 01/05/2019). Patient's initial urine revealed Proteus mirabilis and Enterobacter cloacae. Patient's second urinalysis revealed Proteus mirabilis only.  Patient interactive with family and "much like herself". She still remains tired and sleeping 20 hours/day. Encourage patient's daughter to check blood pressure.  Patient's daughter states patient is better than she was when she was in the hospital. She is acting the same as she did when she was last seen in the clinic. Patient not complaining of dysuria (old symptom), urgency or frequency. Patient has no fever or chills.  Discuss prior conversation with infectious disease. Unclear if her urinalysis/culture represents colonization. Discuss hesitation of using ceftriaxone orn antibiotic that could cause C difficile.  Discuss consideration of a trial of Keflex. Discuss repeat evaluation and/or hospitalization. Patient had declined outpatient ID evaluation. She has yet to see neurology.  Multiple questions asked and answered.   Lequita Asal, MD

## 2019-01-12 ENCOUNTER — Telehealth: Payer: Self-pay | Admitting: Emergency Medicine

## 2019-01-12 ENCOUNTER — Inpatient Hospital Stay: Payer: Medicare Other

## 2019-01-12 ENCOUNTER — Inpatient Hospital Stay (HOSPITAL_BASED_OUTPATIENT_CLINIC_OR_DEPARTMENT_OTHER): Payer: Medicare Other | Admitting: Oncology

## 2019-01-12 ENCOUNTER — Other Ambulatory Visit: Payer: Self-pay

## 2019-01-12 VITALS — BP 113/72 | HR 83 | Temp 97.2°F | Resp 18

## 2019-01-12 DIAGNOSIS — N3000 Acute cystitis without hematuria: Secondary | ICD-10-CM | POA: Diagnosis not present

## 2019-01-12 DIAGNOSIS — E860 Dehydration: Secondary | ICD-10-CM

## 2019-01-12 DIAGNOSIS — R54 Age-related physical debility: Secondary | ICD-10-CM | POA: Diagnosis not present

## 2019-01-12 DIAGNOSIS — F0390 Unspecified dementia without behavioral disturbance: Secondary | ICD-10-CM | POA: Diagnosis not present

## 2019-01-12 DIAGNOSIS — D45 Polycythemia vera: Secondary | ICD-10-CM | POA: Diagnosis not present

## 2019-01-12 DIAGNOSIS — I959 Hypotension, unspecified: Secondary | ICD-10-CM

## 2019-01-12 MED ORDER — SODIUM CHLORIDE 0.9 % IV SOLN
INTRAVENOUS | Status: DC
  Administered 2019-01-12: 15:00:00 via INTRAVENOUS
  Filled 2019-01-12 (×2): qty 250

## 2019-01-12 NOTE — Patient Instructions (Signed)

## 2019-01-12 NOTE — Progress Notes (Signed)
Symptom Management Consult note [employer]  [phone] [phone]  Patient Care Team: Patient, No Pcp Per as PCP - General ([place]) Anthonette Legato, MD (Internal Medicine)   Name of the patient: Laura Wilcox  [id number]  1927/01/24   Date of visit: 01/12/2019   Diagnosis-polycythemia vera  Chief complaint/ Reason for visit- Hypotension  Heme/Onc history:  Oncology History Overview Note  Laura Wilcox is a 85 y.o. female with polycythemia rubra vera and secondary myelofibrosis.  She presented in 04/2002 with thrombocytosis (835,000).  JAK2 V617F was positive on 05/20/2012.   Over the past year, hematocrit has ranged 37.5 - 42.4, hemoglobin 12.1 - 13.4, platelets 458,000 - 569,000, WBC 10,500 - 15,700.  Creatinine has been 1.0 - 1.2.   Bone marrow  on 04/15/2002 revealed a normocellular marrow for age with trilineage hematopoiesis and mild megakaryocytic hyperplasia with focal atypia.  There was no morphologic evidence of a myeloproliferative disorder.  Iron stains were inadequate for evaluation of storage iron.  There was no increase in reticulin fibers.  She has been on agrylin since 2012.   Bone marrow on 09/25/2012 revealed a persistent myeloproliferative disorder s/p Agrylin.  The current features were best classified as persistent myeloproliferative neoplasm (MPN) JAK2 V617F mutation positive.  Marrow was hypercellular for age (30-50%) with increased trilineage hematopoiesis and markedly increased atypical megakaryopoiesis with frequent clustering.  There were no increased blasts.  Iron stores were absent.  There was moderate to severe myelofibrosis (grade MF 2-3).  Differential included ET, PV, primary myelofibrosis, and MPN, unclassifiable.  Cytogenetics were normal (1, XX).   She developed pancytopenia in 12/2015.  Etiology is unclear (medication confusion or hydroxyurea + Agrylin).   She received Procrit 40,000  units (intermittently 09/2012 - 06/2016), Venofer (09/2012 - 10/2012), and Infed (12/2015 - 03/2016).  She received Neupogen during a period of neutropenia. She was anagrelide from 2004 - 08/13/2017.  She began [place] on 08/14/2017.  Jakafi was increased from 5 mg BID to 10 mg BID on 06/17/2018.  Jakafi was increased to 15 mg BID on 08/03/2018.   She began a phlebotomy program on 08/06/2017.  She undergoes small volume (150 cc with replacement of 150 cc NS) if her HCT > 42.   Ferritin has been followed: 529 on 09/03/2017, 1020 on 12/03/2017, 423 on 02/18/2018, and 858 on 11/17/2018.  Iron saturation was 37% and TIBC 270 on 11/17/2018.  B12 was 477 and folate 36 on 11/17/2018.   She was admitted to [facility] from 12/17/2018 - 12/18/2018.  She received 1 unit of PRBCs.  CBC at discharge included a hematocrit of 29.0, hemoglobin 9.4, platelets 364,000, and WBC 10,200.    Polycythemia rubra vera ([place])  06/02/2017 Initial Diagnosis   Polycythemia rubra vera ([facility])    Interval history-patient presents to symptom management today for complaints of hypotension.  She was recently diagnosed with a urinary tract infection by Dr. Mike Gip and given a prescription for Keflex yesterday for Proteus Mirabillis uti.  Patient's daughter, Laura Wilcox called clinic yesterday reporting multiple low blood pressure readings in left arm. Right arm she reports normal blood pressure readings.  She was encouraged to report to ED for evaluation.  Patient unfortunately waited greater than 4 hours and left prior to being seen.  Lab work was completed.  Today, patient tells me she feels "great".  She denies any urinary complaints such as dysuria, increased frequency or hesitancy.  She is urinating normal.  She is having normal bowel  movements.  She started Keflex yesterday and denies any issues with the new antibiotic.  Admits to blood pressure readings in left wrist  as low as 70/40.  Right wrist  blood pressures are within normal limits.  She  denies any recent fevers or illness, easy bleeding or bruising, appetite changes or weight loss, chest pain, nausea, vomiting, constipation or diarrhea.  ECOG FS:0 - Asymptomatic  Review of systems- Review of Systems  Constitutional: Negative.  Negative for chills, fever, malaise/fatigue and weight loss.  HENT: Negative for congestion, ear pain and tinnitus.   Eyes: Negative.  Negative for blurred vision and double vision.  Respiratory: Negative.  Negative for cough, sputum production and shortness of breath.   Cardiovascular: Negative.  Negative for chest pain, palpitations and leg swelling.  Gastrointestinal: Negative.  Negative for abdominal pain, constipation, diarrhea, nausea and vomiting.  Genitourinary: Negative for dysuria, frequency and urgency.  Musculoskeletal: Negative for back pain and falls.  Skin: Negative.  Negative for rash.  Neurological: Negative.  Negative for weakness and headaches.  Endo/Heme/Allergies: Negative.  Does not bruise/bleed easily.  Psychiatric/Behavioral: Positive for memory loss. Negative for depression. The patient is not nervous/anxious and does not have insomnia.     Current treatment-Jackafi  No Known Allergies   Past Medical History:  Diagnosis Date  . Polycythemia      Past Surgical History:  Procedure Laterality Date  . ABDOMINAL HYSTERECTOMY     complete  . BREAST SURGERY      Social History   Socioeconomic History  . Marital status: Widowed    Spouse name: Not on file  . Number of children: Not on file  . Years of education: Not on file  . Highest education level: Not on file  Occupational History  . Not on file  Social Needs  . Financial resource strain: Not on file  . Food insecurity    Worry: Not on file    Inability: Not on file  . Transportation needs    Medical: Not on file    Non-medical: Not on file  Tobacco Use  . Smoking status: Former Smoker    Packs/day: 1.00    Years: 30.00    Pack years: 30.00     Quit date: 04/02/1978    Years since quitting: 40.8  . Smokeless tobacco: Never Used  Substance and Sexual Activity  . Alcohol use: No    Frequency: Never    Comment: occasional glass of wine  . Drug use: No  . Sexual activity: Not on file  Lifestyle  . Physical activity    Days per week: Not on file    Minutes per session: Not on file  . Stress: Not on file  Relationships  . Social Herbalist on phone: Not on file    Gets together: Not on file    Attends religious service: Not on file    Active member of club or organization: Not on file    Attends meetings of clubs or organizations: Not on file    Relationship status: Not on file  . Intimate partner violence    Fear of current or ex partner: Not on file    Emotionally abused: Not on file    Physically abused: Not on file    Forced sexual activity: Not on file  Other Topics Concern  . Not on file  Social History Narrative  . Not on file    Family History  Problem Relation Age of Onset  .  Heart disease Father   . Cancer Sister        breast  . Cancer Sister        breast  . Alzheimer's disease Sister      Current Outpatient Medications:  .  cephALEXin (KEFLEX) 500 MG capsule, Take 1 capsule (500 mg total) by mouth 2 (two) times daily., Disp: 14 capsule, Rfl: 0 .  feeding supplement, ENSURE ENLIVE, (ENSURE ENLIVE) LIQD, Take 237 mLs by mouth 2 (two) times daily between meals., Disp: 237 mL, Rfl: 12 .  Multiple Vitamin (MULTIVITAMIN WITH MINERALS) TABS tablet, Take 1 tablet by mouth daily., Disp: 30 tablet, Rfl: 1  Physical exam: There were no vitals filed for this visit. Physical Exam Vitals reviewed: Frail   Constitutional:      Appearance: Normal appearance.  HENT:     Head: Normocephalic and atraumatic.  Eyes:     Pupils: Pupils are equal, round, and reactive to light.  Neck:     Musculoskeletal: Normal range of motion.  Cardiovascular:     Rate and Rhythm: Normal rate and regular rhythm.      Heart sounds: Normal heart sounds. No murmur.  Pulmonary:     Effort: Pulmonary effort is normal.     Breath sounds: Normal breath sounds. No wheezing.  Abdominal:     General: Bowel sounds are normal. There is no distension.     Palpations: Abdomen is soft.     Tenderness: There is no abdominal tenderness.  Musculoskeletal: Normal range of motion.  Skin:    General: Skin is warm and dry.     Findings: No rash.  Neurological:     Mental Status: She is alert and oriented to person, place, and time.  Psychiatric:        Judgment: Judgment normal.      CMP Latest Ref Rng & Units 01/11/2019  Glucose 70 - 99 mg/dL 118(H)  BUN 8 - 23 mg/dL 20  Creatinine 0.44 - 1.00 mg/dL 1.21(H)  Sodium 135 - 145 mmol/L 135  Potassium 3.5 - 5.1 mmol/L 4.5  Chloride 98 - 111 mmol/L 100  CO2 22 - 32 mmol/L 26  Calcium 8.9 - 10.3 mg/dL 8.9  Total Protein 6.5 - 8.1 g/dL 7.1  Total Bilirubin 0.3 - 1.2 mg/dL 0.4  Alkaline Phos 38 - 126 U/L 92  AST 15 - 41 U/L 17  ALT 0 - 44 U/L 11   CBC Latest Ref Rng & Units 01/11/2019  WBC 4.0 - 10.5 K/uL 14.6(H)  Hemoglobin 12.0 - 15.0 g/dL 10.0(L)  Hematocrit 36.0 - 46.0 % 32.7(L)  Platelets 150 - 400 K/uL 609(H)    No images are attached to the encounter.  Dg Chest 2 View  Result Date: 12/17/2018 CLINICAL DATA:  Reports she went to [facility] today to get blood transfusion, pt took off blood band at home prior to so they were unable to give the blood transfusion. No active bleeding.Hx of breast surgery. Former smoker. EXAM: CHEST - 2 VIEW COMPARISON:  12/09/2018 FINDINGS: Cardiac silhouette is normal in size. No mediastinal or hilar masses. No evidence of adenopathy. Clear lungs.  No pleural effusion or pneumothorax. Skeletal structures are demineralized but intact. IMPRESSION: No active cardiopulmonary disease. Electronically Signed   By: Lajean Manes M.D.   On: 12/17/2018 18:09   Ct Head Wo Contrast  Result Date: 12/17/2018 CLINICAL DATA:  Unable to  obtain much hx from pt, was sent to the ER for a blood transfusion. Per MD  notes CT scan is for AMS. EXAM: CT HEAD WITHOUT CONTRAST TECHNIQUE: Contiguous axial images were obtained from the base of the skull through the vertex without intravenous contrast. COMPARISON:  None. FINDINGS: Brain: The ventricles are normal in size, for the patient's age, and normal in configuration. There are no parenchymal masses or mass effect. There is no evidence of an infarct. There are no extra-axial masses or abnormal fluid collections. No intracranial hemorrhage. Mild periventricular white matter hypoattenuation is noted consistent with chronic microvascular ischemic change. Vascular: No hyperdense vessel or unexpected calcification. Skull: Normal. Negative for fracture or focal lesion. Sinuses/Orbits: Globes and orbits are unremarkable. Visualized sinuses and mastoid air cells are clear. Other: None. IMPRESSION: 1. No acute intracranial abnormalities. 2. Mild chronic microvascular ischemic change. Mild, age-appropriate, volume loss. Electronically Signed   By: Lajean Manes M.D.   On: 12/17/2018 17:59   Assessment and plan- Patient is a 85 y.o. female who presents to [facility] for complaints of hypotension x1 day and possible dehydration.  Polycythemia vera: Jackafi 15 mg twice daily.  Previously holding secondary to anemia requiring hospitalization and blood transfusion.  Platelet count was 452,000.  She receives intermittent low-volume therapeutic phlebotomies. Hemoglobin 10.0 and platlet count 609,000 on 01/11/19 while in ED. May need to restart Jackafi. Will consult Dr. Mike Gip and re-check labs on Friday with possible additional IV fluids.  Proteus Mirabilis urinary tract infection: Currently on Keflex.  Appears to be tolerating well.  No fevers, no dysuria, hematuria or increased urinary frequency or hesitancy.  Continue Keflex as prescribed.  Reviewed side effects of medication given patient's age and frailty.   Hypotension: Possibly related to patient's age, worsening dementia (forgetting to eat and drink), UTI and recent initiation of Keflex.  Per up-to-date common side effects from Keflex include several central nervous system concerns such as agitation, confusion, dizziness, fatigue, hallucinations and headache.  Given her age, we will continue to monitor closely.  Blood pressure in right arm has normal readings.  Left arm is undetectable here in clinic and is chronically low at home.  She is alert and oriented and denies any dizziness or syncopal episodes.  She does not take any blood pressure medicines. Right arm blood pressure readings do not appear to correlate with patient presentation. She may need additional work-up in right arm for venous/vascular insufficiency.   Dehydration: Likely due to worsening dementia and exacerbated by UTI.  Spoke at length about concerns for dehydration.  Recommended keeping track of water intake.  I attached several educational materials with why fluids are so important especially during acute illness such as a UTI.  Spoke to daughter [name] at length.  Plan: Reviewed labs from ED visit yesterday. (Slight bump in creatinine likely due to dehydration) Reviewed urinalysis from yesterday. (Improving UTI) Stat vital signs: Right arm normal readings.  Left arm unreadable. Give 1 L NaCl. Continue Keflex as prescribed.  Disposition: RTC on Friday for repeat lab work and additional IV fluids. May need to restart Jackafi 15 mg BID if counts still elevated.    Visit Diagnosis 1. Acute cystitis without hematuria   2. Hypotension, unspecified hypotension type     Patient expressed understanding and was in agreement with this plan. She also understands that She can call clinic at any time with any questions, concerns, or complaints.   Greater than 50% was spent in counseling and coordination of care with this patient including but not limited to discussion of the relevant  topics above (See A&P) including, but  not limited to diagnosis and management of acute and chronic medical conditions.   Thank you for allowing me to participate in the care of this very pleasant patient.    Jacquelin Hawking, NP [place] [facility] Cell - [phone] Pager- [phone] 01/13/2019 11:12 AM

## 2019-01-12 NOTE — Telephone Encounter (Signed)
Called patient due to lwot to inquire about condition and follow up plans.  Daughter [name] says patient continues to sleep excessively--about 20 hours a day.  She says she  Called onc dr yesterday but did not receive call back until 5pm and then was told to come to ED.  She is worried that her mother is dehydrated. She has called dr Mike Gip again this am, but no call back yet and asks if she should return now or wait. I told her she can bring her in any time and we can see her.

## 2019-01-13 ENCOUNTER — Ambulatory Visit: Payer: Medicare Other

## 2019-01-13 ENCOUNTER — Ambulatory Visit: Payer: Medicare Other | Admitting: Oncology

## 2019-01-14 ENCOUNTER — Other Ambulatory Visit: Payer: Self-pay

## 2019-01-15 ENCOUNTER — Inpatient Hospital Stay: Payer: Medicare Other

## 2019-01-15 VITALS — BP 133/73 | HR 79 | Temp 96.7°F | Resp 18

## 2019-01-15 DIAGNOSIS — F0390 Unspecified dementia without behavioral disturbance: Secondary | ICD-10-CM | POA: Diagnosis not present

## 2019-01-15 DIAGNOSIS — D45 Polycythemia vera: Secondary | ICD-10-CM | POA: Diagnosis not present

## 2019-01-15 DIAGNOSIS — E860 Dehydration: Secondary | ICD-10-CM | POA: Diagnosis not present

## 2019-01-15 DIAGNOSIS — I959 Hypotension, unspecified: Secondary | ICD-10-CM

## 2019-01-15 DIAGNOSIS — R54 Age-related physical debility: Secondary | ICD-10-CM | POA: Diagnosis not present

## 2019-01-15 DIAGNOSIS — N3000 Acute cystitis without hematuria: Secondary | ICD-10-CM | POA: Diagnosis not present

## 2019-01-15 LAB — CBC WITH DIFFERENTIAL/PLATELET
Abs Immature Granulocytes: 0.3 10*3/uL — ABNORMAL HIGH (ref 0.00–0.07)
Basophils Absolute: 0.2 10*3/uL — ABNORMAL HIGH (ref 0.0–0.1)
Basophils Relative: 1 %
Eosinophils Absolute: 0.6 10*3/uL — ABNORMAL HIGH (ref 0.0–0.5)
Eosinophils Relative: 4 %
HCT: 31.4 % — ABNORMAL LOW (ref 36.0–46.0)
Hemoglobin: 9.6 g/dL — ABNORMAL LOW (ref 12.0–15.0)
Immature Granulocytes: 2 %
Lymphocytes Relative: 10 %
Lymphs Abs: 1.5 10*3/uL (ref 0.7–4.0)
MCH: 30.1 pg (ref 26.0–34.0)
MCHC: 30.6 g/dL (ref 30.0–36.0)
MCV: 98.4 fL (ref 80.0–100.0)
Monocytes Absolute: 1.1 10*3/uL — ABNORMAL HIGH (ref 0.1–1.0)
Monocytes Relative: 8 %
Neutro Abs: 10.8 10*3/uL — ABNORMAL HIGH (ref 1.7–7.7)
Neutrophils Relative %: 75 %
Platelets: 626 10*3/uL — ABNORMAL HIGH (ref 150–400)
RBC: 3.19 MIL/uL — ABNORMAL LOW (ref 3.87–5.11)
RDW: 21.9 % — ABNORMAL HIGH (ref 11.5–15.5)
WBC: 14.4 10*3/uL — ABNORMAL HIGH (ref 4.0–10.5)
nRBC: 0 % (ref 0.0–0.2)

## 2019-01-15 LAB — COMPREHENSIVE METABOLIC PANEL
ALT: 10 U/L (ref 0–44)
AST: 15 U/L (ref 15–41)
Albumin: 3.7 g/dL (ref 3.5–5.0)
Alkaline Phosphatase: 82 U/L (ref 38–126)
Anion gap: 8 (ref 5–15)
BUN: 19 mg/dL (ref 8–23)
CO2: 26 mmol/L (ref 22–32)
Calcium: 9 mg/dL (ref 8.9–10.3)
Chloride: 101 mmol/L (ref 98–111)
Creatinine, Ser: 1.16 mg/dL — ABNORMAL HIGH (ref 0.44–1.00)
GFR calc Af Amer: 48 mL/min — ABNORMAL LOW (ref >60)
GFR calc non Af Amer: 41 mL/min — ABNORMAL LOW (ref >60)
Glucose, Bld: 105 mg/dL — ABNORMAL HIGH (ref 70–99)
Potassium: 3.9 mmol/L (ref 3.5–5.1)
Sodium: 135 mmol/L (ref 135–145)
Total Bilirubin: 0.4 mg/dL (ref 0.3–1.2)
Total Protein: 6.9 g/dL (ref 6.5–8.1)

## 2019-01-15 MED ORDER — SODIUM CHLORIDE 0.9 % IV SOLN
INTRAVENOUS | Status: DC
  Administered 2019-01-15: 11:00:00 via INTRAVENOUS
  Filled 2019-01-15 (×2): qty 250

## 2019-01-15 MED ORDER — SODIUM CHLORIDE 0.9 % IV SOLN
Freq: Once | INTRAVENOUS | Status: DC
  Filled 2019-01-15: qty 250

## 2019-01-15 NOTE — Progress Notes (Signed)
VSS, labwork reviewed with NP.  Order received to give 500 cc of NS.

## 2019-01-15 NOTE — Patient Instructions (Signed)
Dehydration, Adult  Dehydration is when there is not enough fluid or water in your body. This happens when you lose more fluids than you take in. Dehydration can range from mild to very bad. It should be treated right away to keep it from getting very bad. Symptoms of mild dehydration may include:  Thirst.  Dry lips.  Slightly dry mouth.  Dry, warm skin.  Dizziness. Symptoms of moderate dehydration may include:  Very dry mouth.  Muscle cramps.  Dark pee (urine). Pee may be the color of tea.  Your body making less pee.  Your eyes making fewer tears.  Heartbeat that is uneven or faster than normal (palpitations).  Headache.  Light-headedness, especially when you stand up from sitting.  Fainting (syncope). Symptoms of very bad dehydration may include:  Changes in skin, such as: ? Cold and clammy skin. ? Blotchy (mottled) or pale skin. ? Skin that does not quickly return to normal after being lightly pinched and let go (poor skin turgor).  Changes in body fluids, such as: ? Feeling very thirsty. ? Your eyes making fewer tears. ? Not sweating when body temperature is high, such as in hot weather. ? Your body making very little pee.  Changes in vital signs, such as: ? Weak pulse. ? Pulse that is more than 100 beats a minute when you are sitting still. ? Fast breathing. ? Low blood pressure.  Other changes, such as: ? Sunken eyes. ? Cold hands and feet. ? Confusion. ? Lack of energy (lethargy). ? Trouble waking up from sleep. ? Short-term weight loss. ? Unconsciousness. Follow these instructions at home:   If told by your doctor, drink an ORS: ? Make an ORS by using instructions on the package. ? Start by drinking small amounts, about  cup (120 mL) every 5-10 minutes. ? Slowly drink more until you have had the amount that your doctor said to have.  Drink enough clear fluid to keep your pee clear or pale yellow. If you were told to drink an ORS, finish the  ORS first, then start slowly drinking clear fluids. Drink fluids such as: ? Water. Do not drink only water by itself. Doing that can make the salt (sodium) level in your body get too low (hyponatremia). ? Ice chips. ? Fruit juice that you have added water to (diluted). ? Low-calorie sports drinks.  Avoid: ? Alcohol. ? Drinks that have a lot of sugar. These include high-calorie sports drinks, fruit juice that does not have water added, and soda. ? Caffeine. ? Foods that are greasy or have a lot of fat or sugar.  Take over-the-counter and prescription medicines only as told by your doctor.  Do not take salt tablets. Doing that can make the salt level in your body get too high (hypernatremia).  Eat foods that have minerals (electrolytes). Examples include bananas, oranges, potatoes, tomatoes, and spinach.  Keep all follow-up visits as told by your doctor. This is important. Contact a doctor if:  You have belly (abdominal) pain that: ? Gets worse. ? Stays in one area (localizes).  You have a rash.  You have a stiff neck.  You get angry or annoyed more easily than normal (irritability).  You are more sleepy than normal.  You have a harder time waking up than normal.  You feel: ? Weak. ? Dizzy. ? Very thirsty.  You have peed (urinated) only a small amount of very dark pee during 6-8 hours. Get help right away if:  You have   symptoms of very bad dehydration.  You cannot drink fluids without throwing up (vomiting).  Your symptoms get worse with treatment.  You have a fever.  You have a very bad headache.  You are throwing up or having watery poop (diarrhea) and it: ? Gets worse. ? Does not go away.  You have blood or something green (bile) in your throw-up.  You have blood in your poop (stool). This may cause poop to look black and tarry.  You have not peed in 6-8 hours.  You pass out (faint).  Your heart rate when you are sitting still is more than 100 beats a  minute.  You have trouble breathing. This information is not intended to replace advice given to you by your health care provider. Make sure you discuss any questions you have with your health care provider. Document Released: 01/12/2009 Document Revised: 02/28/2017 Document Reviewed: 05/12/2015 Elsevier Patient Education  2020 [employer]  

## 2019-01-18 ENCOUNTER — Ambulatory Visit: Payer: Medicare Other

## 2019-01-18 ENCOUNTER — Ambulatory Visit: Payer: Medicare Other | Admitting: Hematology and Oncology

## 2019-01-18 ENCOUNTER — Other Ambulatory Visit: Payer: Medicare Other

## 2019-01-31 NOTE — Progress Notes (Signed)
[facility]  [address] [place], [place] [address] Phone: [phone]  Fax: [phone]   Clinic Day:  02/01/2019  Referring physician: No ref. provider found  Chief Complaint: Laura Wilcox is a 85 y.o. female with with polycythemia rubra vera (PV) who is seen for reassessment.   HPI: The patient was last seen in the hematology clinic on 12/30/2018. At that time, she was slightly better from last visit.  She was up 18 hours/day.  She may have had dysuria.  Exam was stable.  Hematocrit was 30.3 and hemoglobin 9.5.  Platelet count was 391,000.  Jakafi remained on hold.  She was seen for hypotension by Faythe Casa, NP on 01/11/2019.  She received 1 liter of NS.  She continued Keflex for a UTI.  She received additional IVF on 01/15/2019.   Labs followed: 01/11/2019: Hematocrit 32.7, hemoglobin 10, MCV 97.6, platelets 609,000, WBC 14,600. Creatinine 1.21. Lactic acid 1.6. UA revealed moderate leukocytes WBC >50. 01/15/2019: Hematocrit 31.4, hemoglobin 9.6, MCV 98.4, platelets 626,000, WBC 14,400, ANC 10,800.   During the interim, she says she's doing fine.  She had a noticeable improvement after her IVF.  The patient believes she's better. Her daughter says they were able to leave the house for a bit and get some fresh air. She will take an hour long nap during the day. She goes to bed around 11 pm and wakes up around 7 am. Her memory is better; she is able to engage better in conversation.  Her daughter states that she can barely remember September. They haven't beenseen by her PCP. She hasn't made a follow up with neurologist.  [name] is at her daughter's  house, so her mother can't get confused and take them on accident.    Past Medical History:  Diagnosis Date  . Polycythemia     Past Surgical History:  Procedure Laterality Date  . ABDOMINAL HYSTERECTOMY     complete  . BREAST SURGERY      Family History  Problem Relation Age of  Onset  . Heart disease Father   . Cancer Sister        breast  . Cancer Sister        breast  . Alzheimer's disease Sister     Social History:  reports that she quit smoking about 40 years ago. She has a 30.00 pack-year smoking history. She has never used smokeless tobacco. She reports that she does not drink alcohol or use drugs. She is widowed. Patient is retired Sport and exercise psychologist. Patient moved to [facility] in 03/2017. She lives independently in her own apartment in [place]. Her daughter lives next door.Patient denies known exposures to radiation on toxins.Pt's daughter, Laura Wilcox be reached at [phone].  She lives in [place].  The patient is accompanied by her daughter in person today.  Allergies: No Known Allergies  Current Medications: Current Outpatient Medications  Medication Sig Dispense Refill  . feeding supplement, ENSURE ENLIVE, (ENSURE ENLIVE) LIQD Take 237 mLs by mouth 2 (two) times daily between meals. (Patient not taking: Reported on 02/01/2019) 237 mL 12  . Multiple Vitamin (MULTIVITAMIN WITH MINERALS) TABS tablet Take 1 tablet by mouth daily. (Patient not taking: Reported on 02/01/2019) 30 tablet 1  . ruxolitinib phosphate (JAKAFI) 5 MG tablet TAKE 1 TABLET (5 MG TOTAL) BY MOUTH 2 (TWO) TIMES DAILY. 60 tablet 0   No current facility-administered medications for this visit.     Review of Systems  Constitutional: Negative for chills, diaphoresis, fever, malaise/fatigue  and weight loss (stable).       She is "back to normal".  Less sleeping during the day.  HENT: Positive for hearing loss. Negative for congestion, ear pain, nosebleeds, sinus pain and sore throat.   Eyes: Negative.  Negative for blurred vision, double vision and photophobia.  Respiratory: Negative.  Negative for cough, hemoptysis, sputum production and shortness of breath.   Cardiovascular: Negative.  Negative for chest pain, palpitations, orthopnea, leg swelling and PND.  Gastrointestinal: Negative.  Negative for  abdominal pain, blood in stool, constipation, diarrhea, heartburn, nausea and vomiting.  Genitourinary: Negative.  Negative for dysuria, frequency, hematuria and urgency.  Musculoskeletal: Negative.  Negative for back pain, falls, joint pain, myalgias and neck pain.  Skin: Negative.  Negative for itching and rash.  Neurological: Negative.  Negative for dizziness, tingling, tremors, sensory change, speech change, focal weakness, seizures, loss of consciousness, weakness and headaches.  Endo/Heme/Allergies: Negative.  Does not bruise/bleed easily.  Psychiatric/Behavioral: Positive for memory loss (improved ). Negative for depression. The patient is not nervous/anxious and does not have insomnia.   All other systems reviewed and are negative.  Performance status (ECOG):  1  Vitals Blood pressure (!) 126/58, pulse 80, temperature 97.8 F (36.6 C), temperature source Oral, resp. rate 18, weight 110 lb 12.5 oz (50.3 kg), SpO2 100 %.   Physical Exam  Constitutional: She is oriented to person, place, and time. She appears well-developed and well-nourished. No distress.  Thin woman sitting comfortably at home in no acute distress.   HENT:  Head: Normocephalic and atraumatic.  Mouth/Throat: Oropharynx is clear and moist. No oropharyngeal exudate.  Short dark blonde hair.  Mask.  Eyes: Pupils are equal, round, and reactive to light. Conjunctivae and EOM are normal. No scleral icterus.  Blue eyes.  Neck: Normal range of motion. Neck supple. No JVD present.  Cardiovascular: Normal rate and normal heart sounds. Exam reveals no gallop and no friction rub.  No murmur heard. Pulmonary/Chest: Effort normal and breath sounds normal. She has no wheezes. She has no rales. She exhibits no tenderness.  Abdominal: Soft. Bowel sounds are normal. She exhibits no distension and no mass. There is no abdominal tenderness. There is no rebound and no guarding.  Musculoskeletal: Normal range of motion.        General:  No tenderness or edema.  Lymphadenopathy:       Head (right side): No preauricular, no posterior auricular and no occipital adenopathy present.       Head (left side): No preauricular, no posterior auricular and no occipital adenopathy present.    She has no cervical adenopathy.    She has no axillary adenopathy.       Right: No inguinal and no supraclavicular adenopathy present.       Left: No inguinal and no supraclavicular adenopathy present.  Neurological: She is alert and oriented to person, place, and time.  Skin: Skin is warm and dry. No rash noted. She is not diaphoretic. No erythema. No pallor.  Psychiatric: She has a normal mood and affect. Her behavior is normal. Judgment and thought content normal.  Nursing note and vitals reviewed.   Appointment on 02/01/2019  Component Date Value Ref Range Status  . WBC 02/01/2019 16.2* 4.0 - 10.5 K/uL Final  . RBC 02/01/2019 3.82* 3.87 - 5.11 MIL/uL Final  . Hemoglobin 02/01/2019 11.2* 12.0 - 15.0 g/dL Final  . HCT 02/01/2019 36.8  36.0 - 46.0 % Final  . MCV 02/01/2019 96.3  80.0 -  100.0 fL Final  . MCH 02/01/2019 29.3  26.0 - 34.0 pg Final  . MCHC 02/01/2019 30.4  30.0 - 36.0 g/dL Final  . RDW 02/01/2019 21.9* 11.5 - 15.5 % Final  . Platelets 02/01/2019 839* 150 - 400 K/uL Final  . nRBC 02/01/2019 0.0  0.0 - 0.2 % Final  . Neutrophils Relative % 02/01/2019 71  % Final  . Neutro Abs 02/01/2019 11.4* 1.7 - 7.7 K/uL Final  . Lymphocytes Relative 02/01/2019 11  % Final  . Lymphs Abs 02/01/2019 1.8  0.7 - 4.0 K/uL Final  . Monocytes Relative 02/01/2019 7  % Final  . Monocytes Absolute 02/01/2019 1.2* 0.1 - 1.0 K/uL Final  . Eosinophils Relative 02/01/2019 5  % Final  . Eosinophils Absolute 02/01/2019 0.9* 0.0 - 0.5 K/uL Final  . Basophils Relative 02/01/2019 2  % Final  . Basophils Absolute 02/01/2019 0.3* 0.0 - 0.1 K/uL Final  . Immature Granulocytes 02/01/2019 4  % Final  . Abs Immature Granulocytes 02/01/2019 0.60* 0.00 - 0.07 K/uL  Final   Performed at [facility], [address] [place], [place] [address]  . Sodium 02/01/2019 138  135 - 145 mmol/L Final  . Potassium 02/01/2019 3.9  3.5 - 5.1 mmol/L Final  . Chloride 02/01/2019 103  98 - 111 mmol/L Final  . CO2 02/01/2019 25  22 - 32 mmol/L Final  . Glucose, Bld 02/01/2019 104* 70 - 99 mg/dL Final  . BUN 02/01/2019 23  8 - 23 mg/dL Final  . Creatinine, Ser 02/01/2019 1.28* 0.44 - 1.00 mg/dL Final  . Calcium 02/01/2019 9.0  8.9 - 10.3 mg/dL Final  . Total Protein 02/01/2019 7.2  6.5 - 8.1 g/dL Final  . Albumin 02/01/2019 4.1  3.5 - 5.0 g/dL Final  . AST 02/01/2019 18  15 - 41 U/L Final  . ALT 02/01/2019 11  0 - 44 U/L Final  . Alkaline Phosphatase 02/01/2019 99  38 - 126 U/L Final  . Total Bilirubin 02/01/2019 0.4  0.3 - 1.2 mg/dL Final  . GFR calc non Af Amer 02/01/2019 37* >60 mL/min Final  . GFR calc Af Amer 02/01/2019 43* >60 mL/min Final  . Anion gap 02/01/2019 10  5 - 15 Final   Performed at [facility], [address], [place], [place] [address]    Assessment:  Eleftheria Taborn is a 85 y.o. female polycythemia rubra veraand secondary myelofibrosis. She presented in 04/2002 with thrombocytosis (835,000). JAK2 V617Fwas positive on 05/20/2012.  Over the past year,hematocrithas ranged 37.5 - 42.4,hemoglobin12.1 - 13.4, platelets458,000 - 569,000, WBC10,500 - 15,700. Creatinine has been 1.0 - 1.2.  Bone marrowon 04/15/2002 revealed a normocellular marrow for age with trilineage hematopoiesis and mild megakaryocytic hyperplasia with focal atypia. There was no morphologic evidence of a myeloproliferative disorder. Iron stains were inadequate for evaluation of storage iron. There was no increase in reticulin fibers. She has been on agrylin since 2012.  Bone marrowon 09/25/2012 revealed a persistent myeloproliferative disorder s/p Agrylin. The current features were best classified as persistent myeloproliferative  neoplasm (MPN) JAK2 V617F mutation positive. Marrow was hypercellular for age (30-50%) with increased trilineage hematopoiesis and markedly increased atypical megakaryopoiesis with frequent clustering. There were no increased blasts. Iron stores were absent. There was moderate to severe myelofibrosis(grade MF 2-3). Differential included ET, PV, primary myelofibrosis, and MPN, unclassifiable. Cytogeneticswere normal (66, XX).  She developed pancytopeniain 12/2015. Etiology is unclear (medication confusion or hydroxyurea + Agrylin).  She received Procrit40,000 units (intermittently  09/2012 - 06/2016), Venofer(09/2012 - 10/2012), and Infed(12/2015 - 03/2016). She received Neupogenduring a period of neutropenia. She was anagrelidefrom 2004 - 08/13/2017. She beganJakafion 08/14/2017. Jakafi was increased from 5 mg BID to 10 mg BID on03/18/2020.Jakafi was increased to 15 mg BID on 08/03/2018.  She began a phlebotomy programon 08/06/2017. She undergoes small volume (150 cc with replacement of 150 cc NS)if her HCT >42.  Ferritinhas been followed: 529 on 09/03/2017, 1020 on 12/03/2017, 423 on 02/18/2018, and 858 on 11/17/2018.Iron saturationwas 37%and TIBC 270 on08/18/2020.B12 was 477 and folate 36 on 11/17/2018.  She was admitted to [facility] 12/17/2018 - 12/18/2018. She received 1 unit of PRBCs.CBC at discharge included a hematocrit of 29.0, hemoglobin 9.4, platelets 364,000, and WBC 10,200.   She received a course of Keflex for a Proteus mirabilis UTI beginning 01/11/2019.  Symptomatically, she is back to baseline.  Exam reveals no adenopathy or hepatosplenomegaly.  Platelet count is 839,000.  Plan: 1.   Labs today:CBC with diff, CMP. 2.Polycythemia rubra vera Hematocrit36.8.Hemoglobin11.2.Platelets839,000. Platelet count goal < 400,000. Restart Jakafi 5 mg BID.  Contact Nuala Alpha, oral chemotherapy pharmacist- done. Patient's daughter has  Jakafi 10 mg tablets at home.  Patient to take Jakafi 10 mg a day until new Rx arrives. Monitor closely. 3. Confusion Patient's mentation has cleared.  Encourage follow-up with PCP/neurology. 4.Renal insufficiency BUN23and creatinine 1.28. CrCl 23 ml/min. Encourage good hydration. Check orthostatic today- no IVF needed. Monitor closely. 5.   RN to call Nuala Alpha re: [name] Rx sent in. 6.   RTC in 2 weeks for labs (CBC with diff). 7.   RTC in 4 weeks for MD assess and labs (CBC with diff, CMP).  I discussed the assessment and treatment plan with the patient.  The patient was provided an opportunity to ask questions and all were answered.  The patient agreed with the plan and demonstrated an understanding of the instructions.  The patient was advised to call back if the symptoms worsen or if the condition fails to improve as anticipated.  I provided 22 minutes (10:31 AM - 10:51 AM) of face-to-face time during this this encounter and > 50% was spent counseling as documented under my assessment and plan.    Lequita Asal, MD, PhD    02/01/2019, 10:49 AM  I, Samul Dada, am acting as a scribe for Lequita Asal, MD.  I, Stanford Mike Gip, MD, have reviewed the above documentation for accuracy and completeness, and I agree with the above.

## 2019-02-01 ENCOUNTER — Other Ambulatory Visit: Payer: Self-pay

## 2019-02-01 ENCOUNTER — Inpatient Hospital Stay (HOSPITAL_BASED_OUTPATIENT_CLINIC_OR_DEPARTMENT_OTHER): Payer: Medicare Other | Admitting: Hematology and Oncology

## 2019-02-01 ENCOUNTER — Telehealth: Payer: Self-pay

## 2019-02-01 ENCOUNTER — Other Ambulatory Visit: Payer: Self-pay | Admitting: Hematology and Oncology

## 2019-02-01 ENCOUNTER — Encounter: Payer: Self-pay | Admitting: Hematology and Oncology

## 2019-02-01 ENCOUNTER — Inpatient Hospital Stay: Payer: Medicare Other | Attending: Hematology and Oncology

## 2019-02-01 ENCOUNTER — Inpatient Hospital Stay: Payer: Medicare Other

## 2019-02-01 VITALS — BP 123/74 | HR 81 | Temp 97.8°F | Resp 18 | Wt 110.8 lb

## 2019-02-01 DIAGNOSIS — R410 Disorientation, unspecified: Secondary | ICD-10-CM | POA: Diagnosis not present

## 2019-02-01 DIAGNOSIS — Z87891 Personal history of nicotine dependence: Secondary | ICD-10-CM | POA: Diagnosis not present

## 2019-02-01 DIAGNOSIS — D45 Polycythemia vera: Secondary | ICD-10-CM | POA: Insufficient documentation

## 2019-02-01 DIAGNOSIS — N289 Disorder of kidney and ureter, unspecified: Secondary | ICD-10-CM | POA: Diagnosis not present

## 2019-02-01 DIAGNOSIS — Z818 Family history of other mental and behavioral disorders: Secondary | ICD-10-CM | POA: Insufficient documentation

## 2019-02-01 DIAGNOSIS — Z79899 Other long term (current) drug therapy: Secondary | ICD-10-CM | POA: Diagnosis not present

## 2019-02-01 DIAGNOSIS — Z8249 Family history of ischemic heart disease and other diseases of the circulatory system: Secondary | ICD-10-CM | POA: Insufficient documentation

## 2019-02-01 DIAGNOSIS — I959 Hypotension, unspecified: Secondary | ICD-10-CM | POA: Diagnosis not present

## 2019-02-01 DIAGNOSIS — Z7189 Other specified counseling: Secondary | ICD-10-CM | POA: Diagnosis not present

## 2019-02-01 DIAGNOSIS — D7581 Myelofibrosis: Secondary | ICD-10-CM | POA: Insufficient documentation

## 2019-02-01 DIAGNOSIS — Z803 Family history of malignant neoplasm of breast: Secondary | ICD-10-CM | POA: Insufficient documentation

## 2019-02-01 DIAGNOSIS — N390 Urinary tract infection, site not specified: Secondary | ICD-10-CM | POA: Insufficient documentation

## 2019-02-01 LAB — COMPREHENSIVE METABOLIC PANEL
ALT: 11 U/L (ref 0–44)
AST: 18 U/L (ref 15–41)
Albumin: 4.1 g/dL (ref 3.5–5.0)
Alkaline Phosphatase: 99 U/L (ref 38–126)
Anion gap: 10 (ref 5–15)
BUN: 23 mg/dL (ref 8–23)
CO2: 25 mmol/L (ref 22–32)
Calcium: 9 mg/dL (ref 8.9–10.3)
Chloride: 103 mmol/L (ref 98–111)
Creatinine, Ser: 1.28 mg/dL — ABNORMAL HIGH (ref 0.44–1.00)
GFR calc Af Amer: 43 mL/min — ABNORMAL LOW (ref >60)
GFR calc non Af Amer: 37 mL/min — ABNORMAL LOW (ref >60)
Glucose, Bld: 104 mg/dL — ABNORMAL HIGH (ref 70–99)
Potassium: 3.9 mmol/L (ref 3.5–5.1)
Sodium: 138 mmol/L (ref 135–145)
Total Bilirubin: 0.4 mg/dL (ref 0.3–1.2)
Total Protein: 7.2 g/dL (ref 6.5–8.1)

## 2019-02-01 LAB — CBC WITH DIFFERENTIAL/PLATELET
Abs Immature Granulocytes: 0.6 10*3/uL — ABNORMAL HIGH (ref 0.00–0.07)
Basophils Absolute: 0.3 10*3/uL — ABNORMAL HIGH (ref 0.0–0.1)
Basophils Relative: 2 %
Eosinophils Absolute: 0.9 10*3/uL — ABNORMAL HIGH (ref 0.0–0.5)
Eosinophils Relative: 5 %
HCT: 36.8 % (ref 36.0–46.0)
Hemoglobin: 11.2 g/dL — ABNORMAL LOW (ref 12.0–15.0)
Immature Granulocytes: 4 %
Lymphocytes Relative: 11 %
Lymphs Abs: 1.8 10*3/uL (ref 0.7–4.0)
MCH: 29.3 pg (ref 26.0–34.0)
MCHC: 30.4 g/dL (ref 30.0–36.0)
MCV: 96.3 fL (ref 80.0–100.0)
Monocytes Absolute: 1.2 10*3/uL — ABNORMAL HIGH (ref 0.1–1.0)
Monocytes Relative: 7 %
Neutro Abs: 11.4 10*3/uL — ABNORMAL HIGH (ref 1.7–7.7)
Neutrophils Relative %: 71 %
Platelets: 839 10*3/uL — ABNORMAL HIGH (ref 150–400)
RBC: 3.82 MIL/uL — ABNORMAL LOW (ref 3.87–5.11)
RDW: 21.9 % — ABNORMAL HIGH (ref 11.5–15.5)
WBC: 16.2 10*3/uL — ABNORMAL HIGH (ref 4.0–10.5)
nRBC: 0 % (ref 0.0–0.2)

## 2019-02-01 MED ORDER — RUXOLITINIB PHOSPHATE 5 MG PO TABS: ORAL_TABLET | ORAL | 0 refills | Status: DC

## 2019-02-01 NOTE — Telephone Encounter (Signed)
Spoke with daughter, [name]. Informed her, per Dr. Mike Gip, she can start Jakafi 10 MG daily until she received the 5 MG tablets on Wednesday. Daughter verbalizes understanding and denies any further questions or concerns.

## 2019-02-01 NOTE — Progress Notes (Signed)
Patient here for follow up. Denies any concerns. Daughter reports patient seems "back to normal". Not sleeping as much as she used to.

## 2019-02-01 NOTE — Telephone Encounter (Signed)
-----   Message from Secundino Ginger sent at 02/01/2019  4:16 PM EST ----- Regarding: CXWNPI Contact: [phone] [name] ([email or web address]) called and Mardene Celeste does not have 5 mg jakafi. She has 10 mg and 15 mg. She wants to know if she should take 10 mg a day until she gets the 5 mg on Wed from pharmacy.

## 2019-02-02 MED FILL — JAKAFI 5 MG TABLET: 5 | 30 days supply | Qty: 60 | Fill #0

## 2019-02-15 ENCOUNTER — Telehealth: Payer: Self-pay

## 2019-02-15 ENCOUNTER — Other Ambulatory Visit: Payer: Self-pay

## 2019-02-15 ENCOUNTER — Inpatient Hospital Stay: Payer: Medicare Other

## 2019-02-15 DIAGNOSIS — R410 Disorientation, unspecified: Secondary | ICD-10-CM | POA: Diagnosis not present

## 2019-02-15 DIAGNOSIS — D45 Polycythemia vera: Secondary | ICD-10-CM | POA: Diagnosis not present

## 2019-02-15 DIAGNOSIS — N390 Urinary tract infection, site not specified: Secondary | ICD-10-CM | POA: Diagnosis not present

## 2019-02-15 DIAGNOSIS — I959 Hypotension, unspecified: Secondary | ICD-10-CM | POA: Diagnosis not present

## 2019-02-15 DIAGNOSIS — D7581 Myelofibrosis: Secondary | ICD-10-CM | POA: Diagnosis not present

## 2019-02-15 DIAGNOSIS — N289 Disorder of kidney and ureter, unspecified: Secondary | ICD-10-CM | POA: Diagnosis not present

## 2019-02-15 LAB — CBC WITH DIFFERENTIAL/PLATELET
Abs Immature Granulocytes: 0.44 10*3/uL — ABNORMAL HIGH (ref 0.00–0.07)
Basophils Absolute: 0.4 10*3/uL — ABNORMAL HIGH (ref 0.0–0.1)
Basophils Relative: 2 %
Eosinophils Absolute: 1 10*3/uL — ABNORMAL HIGH (ref 0.0–0.5)
Eosinophils Relative: 7 %
HCT: 37.1 % (ref 36.0–46.0)
Hemoglobin: 11.4 g/dL — ABNORMAL LOW (ref 12.0–15.0)
Immature Granulocytes: 3 %
Lymphocytes Relative: 15 %
Lymphs Abs: 2.3 10*3/uL (ref 0.7–4.0)
MCH: 29.1 pg (ref 26.0–34.0)
MCHC: 30.7 g/dL (ref 30.0–36.0)
MCV: 94.6 fL (ref 80.0–100.0)
Monocytes Absolute: 1.2 10*3/uL — ABNORMAL HIGH (ref 0.1–1.0)
Monocytes Relative: 8 %
Neutro Abs: 9.7 10*3/uL — ABNORMAL HIGH (ref 1.7–7.7)
Neutrophils Relative %: 65 %
Platelets: 830 10*3/uL — ABNORMAL HIGH (ref 150–400)
RBC: 3.92 MIL/uL (ref 3.87–5.11)
RDW: 21.1 % — ABNORMAL HIGH (ref 11.5–15.5)
WBC: 15 10*3/uL — ABNORMAL HIGH (ref 4.0–10.5)
nRBC: 0 % (ref 0.0–0.2)

## 2019-02-15 NOTE — Telephone Encounter (Signed)
VM left requesting callback. This is regarding elevated platelet count. Patient needs to be reminded to take [facility] daily as we were informed by daughter, [name], that patient had missed 9 doses.

## 2019-02-16 ENCOUNTER — Telehealth: Payer: Self-pay

## 2019-02-16 NOTE — Telephone Encounter (Signed)
Spoke with Laura Wilcox to see how we can help with the patient complaince of medication. She states she gotta get mother to take the medication. She does refused at times.

## 2019-02-16 NOTE — Telephone Encounter (Signed)
-----   Message from Lequita Asal, MD sent at 02/16/2019  1:19 PM EST ----- Regarding: RE: Please call patient's daughter  Please inquire how we can help with patient's medication compliance.  M ----- Message ----- From: Vito Berger, CMA Sent: 02/15/2019   4:30 PM EST To: Lequita Asal, MD Subject: RE: Please call patient's daughter             Yes her daughter reports there was 9 pills missed ----- Message ----- From: Lequita Asal, MD Sent: 02/15/2019   3:31 PM EST To: Vito Berger, CMA Subject: Please call patient's daughter                  Platelet count remains elevated at 830,000  (stable to slightly lower than last visit).  Has she been taking her Jakafi?  M ----- Message ----- From: Buel Ream, [facility] [place] Sent: 02/15/2019  11:02 AM EST To: Lequita Asal, MD

## 2019-02-22 ENCOUNTER — Other Ambulatory Visit: Payer: Self-pay | Admitting: Hematology and Oncology

## 2019-02-22 DIAGNOSIS — D45 Polycythemia vera: Secondary | ICD-10-CM

## 2019-02-23 ENCOUNTER — Telehealth: Payer: Self-pay

## 2019-02-23 NOTE — Telephone Encounter (Signed)
Refill JAKAFI 5 mg 1 tab po BID # 60 with no refills. The patient daughter [name] ) has been made aware.

## 2019-02-24 ENCOUNTER — Other Ambulatory Visit: Payer: Self-pay

## 2019-02-24 ENCOUNTER — Inpatient Hospital Stay: Payer: Medicare Other

## 2019-02-24 DIAGNOSIS — I959 Hypotension, unspecified: Secondary | ICD-10-CM | POA: Diagnosis not present

## 2019-02-24 DIAGNOSIS — N289 Disorder of kidney and ureter, unspecified: Secondary | ICD-10-CM | POA: Diagnosis not present

## 2019-02-24 DIAGNOSIS — D45 Polycythemia vera: Secondary | ICD-10-CM | POA: Diagnosis not present

## 2019-02-24 DIAGNOSIS — R410 Disorientation, unspecified: Secondary | ICD-10-CM | POA: Diagnosis not present

## 2019-02-24 DIAGNOSIS — N390 Urinary tract infection, site not specified: Secondary | ICD-10-CM | POA: Diagnosis not present

## 2019-02-24 DIAGNOSIS — D7581 Myelofibrosis: Secondary | ICD-10-CM | POA: Diagnosis not present

## 2019-02-24 LAB — COMPREHENSIVE METABOLIC PANEL
ALT: 12 U/L (ref 0–44)
AST: 18 U/L (ref 15–41)
Albumin: 4 g/dL (ref 3.5–5.0)
Alkaline Phosphatase: 96 U/L (ref 38–126)
Anion gap: 8 (ref 5–15)
BUN: 26 mg/dL — ABNORMAL HIGH (ref 8–23)
CO2: 27 mmol/L (ref 22–32)
Calcium: 9.3 mg/dL (ref 8.9–10.3)
Chloride: 102 mmol/L (ref 98–111)
Creatinine, Ser: 1.24 mg/dL — ABNORMAL HIGH (ref 0.44–1.00)
GFR calc Af Amer: 44 mL/min — ABNORMAL LOW (ref >60)
GFR calc non Af Amer: 38 mL/min — ABNORMAL LOW (ref >60)
Glucose, Bld: 127 mg/dL — ABNORMAL HIGH (ref 70–99)
Potassium: 4.7 mmol/L (ref 3.5–5.1)
Sodium: 137 mmol/L (ref 135–145)
Total Bilirubin: 0.1 mg/dL — ABNORMAL LOW (ref 0.3–1.2)
Total Protein: 7.3 g/dL (ref 6.5–8.1)

## 2019-02-24 LAB — CBC WITH DIFFERENTIAL/PLATELET
Abs Immature Granulocytes: 0.6 10*3/uL — ABNORMAL HIGH (ref 0.00–0.07)
Basophils Absolute: 0.3 10*3/uL — ABNORMAL HIGH (ref 0.0–0.1)
Basophils Relative: 2 %
Eosinophils Absolute: 1.1 10*3/uL — ABNORMAL HIGH (ref 0.0–0.5)
Eosinophils Relative: 7 %
HCT: 37.7 % (ref 36.0–46.0)
Hemoglobin: 11.9 g/dL — ABNORMAL LOW (ref 12.0–15.0)
Immature Granulocytes: 4 %
Lymphocytes Relative: 17 %
Lymphs Abs: 2.6 10*3/uL (ref 0.7–4.0)
MCH: 29.5 pg (ref 26.0–34.0)
MCHC: 31.6 g/dL (ref 30.0–36.0)
MCV: 93.3 fL (ref 80.0–100.0)
Monocytes Absolute: 1.2 10*3/uL — ABNORMAL HIGH (ref 0.1–1.0)
Monocytes Relative: 8 %
Neutro Abs: 9.4 10*3/uL — ABNORMAL HIGH (ref 1.7–7.7)
Neutrophils Relative %: 62 %
Platelets: 793 10*3/uL — ABNORMAL HIGH (ref 150–400)
RBC: 4.04 MIL/uL (ref 3.87–5.11)
RDW: 20.6 % — ABNORMAL HIGH (ref 11.5–15.5)
WBC: 15.2 10*3/uL — ABNORMAL HIGH (ref 4.0–10.5)
nRBC: 0 % (ref 0.0–0.2)

## 2019-02-27 NOTE — Progress Notes (Signed)
[facility]  [address] [place], [place] [address] Phone: [phone]  Fax: [phone]   Telemedicine Office Visit:  03/01/2019  Referring physician: No ref. provider found  I connected with Laura Wilcox on 03/01/19 at 12:17 PM by videoconferencing and verified that I was speaking with the correct person using 2 identifiers.  The patient was at  Home with her daughter.  I discussed the limitations, risk, security and privacy concerns of performing an evaluation and management service by videoconferencing and the availability of in person appointments.  I also discussed with the patient that there may be a patient responsible charge related to this service.  The patient expressed understanding and agreed to proceed.  Chief Complaint: Laura Wilcox is a 85 y.o. female with polycythemia rubra vera (PV) on Jakafi who is seen for 4 week assessment   HPI: The patient was last seen in the hematology clinic on 02/01/2019. At that time, she is back to baseline.  Exam reveals no adenopathy or hepatosplenomegaly.  Platelet count was 839,000. Jakafi 5 mg BID was restarted.  CBC on 02/15/2019 revealed a hematocrit 37.1, hemoglobin 11.4, MCV 94.6, platelets 830,000, WBC 15,000, with an [place]. CBC on 02/24/2019 revealed a hematocrit 37.7, hemoglobin 11.9, MCV 93.3, platelets 739,000, WBC 15,200, with an [place] 9400. BUN was 26 and creatinine 1.24  Her daughter was contacted on 02/15/2019.  She had missed 9 Jakafi doses.  During the interim, she has been more like herself this month. Daughter says she has memory issues but no longer has confusion like she had when she had UTI  Symptomatically, she is doing "alright" today. She is eating well. The daughter says she is more on top of counting her mother's pills making sure she doesn't miss any more doses. So far she has only missed 2 doses since 02/15/2019.  Her daughter will set out her pills for her to take.    Past Medical History:  Diagnosis Date  . Polycythemia     Past Surgical History:  Procedure Laterality Date  . ABDOMINAL HYSTERECTOMY     complete  . BREAST SURGERY      Family History  Problem Relation Age of Onset  . Heart disease Father   . Cancer Sister        breast  . Cancer Sister        breast  . Alzheimer's disease Sister     Social History:  reports that she quit smoking about 40 years ago. She has a 30.00 pack-year smoking history. She has never used smokeless tobacco. She reports that she does not drink alcohol or use drugs. She is widowed. Patient is retired Sport and exercise psychologist. Patient moved to [facility] in 03/2017. She lives independently in her own apartment in [place]. Her daughter lives next door.Patient denies known exposures to radiation on toxins.Pt's daughter, [name] be reached at [phone].  She lives in [place]. The patient is accompanied by her daughter [name] today.  Participants in the patient's visit and their role in the encounter included the patient, Samul Dada, scribe, and [employer], RN or [employer], [place], today.  The intake visit was provided by Waymon Budge, RN.    Allergies: No Known Allergies  Current Medications: Current Outpatient Medications  Medication Sig Dispense Refill  . JAKAFI 5 MG tablet TAKE 1 TABLET (5 MG TOTAL) BY MOUTH 2 (TWO) TIMES DAILY. 60 tablet 0  . Multiple Vitamin (MULTIVITAMIN WITH MINERALS) TABS tablet Take 1 tablet by mouth daily. [place]  tablet 1  . feeding supplement, ENSURE ENLIVE, (ENSURE ENLIVE) LIQD Take 237 mLs by mouth 2 (two) times daily between meals. (Patient not taking: Reported on 02/01/2019) 237 mL 12   No current facility-administered medications for this visit.     Review of Systems  Constitutional: Negative for chills, diaphoresis, fever, malaise/fatigue and weight loss (stable).       She is "back to normal".  Less sleeping during the day.  HENT: Positive for hearing loss. Negative for  congestion, ear pain, nosebleeds, sinus pain and sore throat.   Eyes: Negative.  Negative for blurred vision, double vision and photophobia.  Respiratory: Negative.  Negative for cough, hemoptysis, sputum production and shortness of breath.   Cardiovascular: Negative.  Negative for chest pain, palpitations, orthopnea, leg swelling and PND.  Gastrointestinal: Negative.  Negative for abdominal pain, blood in stool, constipation, diarrhea, heartburn, nausea and vomiting.  Genitourinary: Negative.  Negative for dysuria, frequency, hematuria and urgency.  Musculoskeletal: Negative.  Negative for back pain, falls, joint pain, myalgias and neck pain.  Skin: Negative.  Negative for itching and rash.  Neurological: Negative.  Negative for dizziness, tingling, tremors, sensory change, speech change, focal weakness, seizures, weakness and headaches.  Endo/Heme/Allergies: Negative.  Does not bruise/bleed easily.  Psychiatric/Behavioral: Positive for memory loss (improved ). Negative for depression. The patient is not nervous/anxious and does not have insomnia.   All other systems reviewed and are negative.  Performance status (ECOG):  1  Vitals There were no vitals taken for this visit.   Physical Exam  Constitutional: She is oriented to person, place, and time. She appears well-developed and well-nourished.  Thin woman sitting comfortably at home in no acute distress.   HENT:  Head: Normocephalic and atraumatic.  Short dark blonde hair.  Eyes: Conjunctivae and EOM are normal. No scleral icterus.  Blue eyes.  Neurological: She is alert and oriented to person, place, and time.  Psychiatric: She has a normal mood and affect. Her behavior is normal. Judgment and thought content normal.  Nursing note reviewed.   No visits with results within 3 Day(s) from this visit.  Latest known visit with results is:  Appointment on 02/24/2019  Component Date Value Ref Range Status  . WBC 02/24/2019 15.2* 4.0 -  10.5 K/uL Final  . RBC 02/24/2019 4.04  3.87 - 5.11 MIL/uL Final  . Hemoglobin 02/24/2019 11.9* 12.0 - 15.0 g/dL Final  . HCT 02/24/2019 37.7  36.0 - 46.0 % Final  . MCV 02/24/2019 93.3  80.0 - 100.0 fL Final  . MCH 02/24/2019 29.5  26.0 - 34.0 pg Final  . MCHC 02/24/2019 31.6  30.0 - 36.0 g/dL Final  . RDW 02/24/2019 20.6* 11.5 - 15.5 % Final  . Platelets 02/24/2019 793* 150 - 400 K/uL Final  . nRBC 02/24/2019 0.0  0.0 - 0.2 % Final  . Neutrophils Relative % 02/24/2019 62  % Final  . Neutro Abs 02/24/2019 9.4* 1.7 - 7.7 K/uL Final  . Lymphocytes Relative 02/24/2019 17  % Final  . Lymphs Abs 02/24/2019 2.6  0.7 - 4.0 K/uL Final  . Monocytes Relative 02/24/2019 8  % Final  . Monocytes Absolute 02/24/2019 1.2* 0.1 - 1.0 K/uL Final  . Eosinophils Relative 02/24/2019 7  % Final  . Eosinophils Absolute 02/24/2019 1.1* 0.0 - 0.5 K/uL Final  . Basophils Relative 02/24/2019 2  % Final  . Basophils Absolute 02/24/2019 0.3* 0.0 - 0.1 K/uL Final  . Immature Granulocytes 02/24/2019 4  % Final  .  Abs Immature Granulocytes 02/24/2019 0.60* 0.00 - 0.07 K/uL Final   Performed at [facility], [address], [place], [place] [address]  . Sodium 02/24/2019 137  135 - 145 mmol/L Final  . Potassium 02/24/2019 4.7  3.5 - 5.1 mmol/L Final  . Chloride 02/24/2019 102  98 - 111 mmol/L Final  . CO2 02/24/2019 27  22 - 32 mmol/L Final  . Glucose, Bld 02/24/2019 127* 70 - 99 mg/dL Final  . BUN 02/24/2019 26* 8 - 23 mg/dL Final  . Creatinine, Ser 02/24/2019 1.24* 0.44 - 1.00 mg/dL Final  . Calcium 02/24/2019 9.3  8.9 - 10.3 mg/dL Final  . Total Protein 02/24/2019 7.3  6.5 - 8.1 g/dL Final  . Albumin 02/24/2019 4.0  3.5 - 5.0 g/dL Final  . AST 02/24/2019 18  15 - 41 U/L Final  . ALT 02/24/2019 12  0 - 44 U/L Final  . Alkaline Phosphatase 02/24/2019 96  38 - 126 U/L Final  . Total Bilirubin 02/24/2019 0.1* 0.3 - 1.2 mg/dL Final  . GFR calc non Af Amer 02/24/2019 38* >60 mL/min Final  . GFR calc  Af Amer 02/24/2019 44* >60 mL/min Final  . Anion gap 02/24/2019 8  5 - 15 Final   Performed at [facility], [address] [place], [place] [address]    Assessment:  Haisley Arens is a 85 y.o. female polycythemia rubra veraand secondary myelofibrosis. She presented in 04/2002 with thrombocytosis (835,000). JAK2 V617Fwas positive on 05/20/2012.  Over the past year,hematocrithas ranged 37.5 - 42.4,hemoglobin12.1 - 13.4, platelets458,000 - 569,000, WBC10,500 - 15,700. Creatinine has been 1.0 - 1.2.  Bone marrowon 04/15/2002 revealed a normocellular marrow for age with trilineage hematopoiesis and mild megakaryocytic hyperplasia with focal atypia. There was no morphologic evidence of a myeloproliferative disorder. Iron stains were inadequate for evaluation of storage iron. There was no increase in reticulin fibers. She has been on agrylin since 2012.  Bone marrowon 09/25/2012 revealed a persistent myeloproliferative disorder s/p Agrylin. The current features were best classified as persistent myeloproliferative neoplasm (MPN) JAK2 V617F mutation positive. Marrow was hypercellular for age (30-50%) with increased trilineage hematopoiesis and markedly increased atypical megakaryopoiesis with frequent clustering. There were no increased blasts. Iron stores were absent. There was moderate to severe myelofibrosis(grade MF 2-3). Differential included ET, PV, primary myelofibrosis, and MPN, unclassifiable. Cytogeneticswere normal (67, XX).  She developed pancytopeniain 12/2015. Etiology is unclear (medication confusion or hydroxyurea + Agrylin).  She received Procrit40,000 units (intermittently 09/2012 - 06/2016), Venofer(09/2012 - 10/2012), and Infed(12/2015 - 03/2016). She received Neupogenduring a period of neutropenia. She was anagrelidefrom 2004 - 08/13/2017. She beganJakafion 08/14/2017. Jakafi was increased from 5 mg BID to 10 mg BID  on03/18/2020.Jakafi was increased to 15 mg BID on 08/03/2018.  She has been back on Jakafi 5 mg BID since 02/01/2019.  She began a phlebotomy programon 08/06/2017. She undergoes small volume (150 cc with replacement of 150 cc NS)if her HCT >42.  Ferritinhas been followed: 529 on 09/03/2017, 1020 on 12/03/2017, 423 on 02/18/2018, and 858 on 11/17/2018.Iron saturationwas 37%and TIBC 270 on08/18/2020.B12 was 477 and folate 36 on 11/17/2018.  She was admitted to [facility] 12/17/2018 - 12/18/2018. She received 1 unit of PRBCs.CBC at discharge included a hematocrit of 29.0, hemoglobin 9.4, platelets 364,000, and WBC 10,200.  She received a course of Keflex for a Proteus mirabilis UTI beginning 01/11/2019.  Symptomatically, she is doing well.  Mental status is back to baseline.  She has missed doses of  Jakafi.  Plan: 1.   Review labs during the interim. 2.Polycythemia rubra vera Hematocrit37.7.Hemoglobin11.9.Platelets793,000. Platelet count goal < 400,000. Patient has been back on Jakafi 5 mg BID since 02/01/2019.             She has missed 11 doses since that time.  Her daughter is now putting out her medications and checking to see that she has taken them.   She has missed less doses since then. Continue current dose of Jakafi. If platelet count levels off, will increase dose to 10 mg BID. 3. Confusion Patient's mentation has cleared.             Etiology felt secondary to UTI. 4.Renal insufficiency BUN26and creatinine 1.24. CrCl 23 ml/min. Continue to encourage good hydration. Monitor closely. 5.   RTC in 4 weeks for MD assessment and labs (CBC with diff, CMP).  I discussed the assessment and treatment plan with the patient.  The patient was provided an opportunity to ask questions and all were answered.  The patient agreed with the plan and demonstrated an understanding of the instructions.  The patient was advised to call back if  the symptoms worsen or if the condition fails to improve as anticipated.  I provided 13 minutes (12:05 PM - 12:17 PM) of face-to-face time during this this encounter and > 50% was spent counseling as documented under my assessment and plan.    Lequita Asal, MD, PhD    03/01/2019, 12:17 PM  I, Samul Dada, am acting as a scribe for Lequita Asal, MD.  I, [place] Mike Gip, MD, have reviewed the above documentation for accuracy and completeness, and I agree with the above.

## 2019-03-01 ENCOUNTER — Other Ambulatory Visit: Payer: Self-pay | Admitting: Hematology and Oncology

## 2019-03-01 ENCOUNTER — Other Ambulatory Visit: Payer: Medicare Other

## 2019-03-01 ENCOUNTER — Inpatient Hospital Stay (HOSPITAL_BASED_OUTPATIENT_CLINIC_OR_DEPARTMENT_OTHER): Payer: Medicare Other | Admitting: Hematology and Oncology

## 2019-03-01 ENCOUNTER — Encounter: Payer: Self-pay | Admitting: Hematology and Oncology

## 2019-03-01 DIAGNOSIS — D45 Polycythemia vera: Secondary | ICD-10-CM

## 2019-03-01 DIAGNOSIS — N289 Disorder of kidney and ureter, unspecified: Secondary | ICD-10-CM | POA: Diagnosis not present

## 2019-03-01 DIAGNOSIS — Z7189 Other specified counseling: Secondary | ICD-10-CM

## 2019-03-01 DIAGNOSIS — G3184 Mild cognitive impairment, so stated: Secondary | ICD-10-CM | POA: Diagnosis not present

## 2019-03-01 MED FILL — JAKAFI 5 MG TABLET: 5 | 30 days supply | Qty: 60 | Fill #0

## 2019-03-01 NOTE — Progress Notes (Signed)
Confirmed Name, DOB, and Address. Assessment completed with assistance of daughter, [name]. Daughter reports she has been counting the Jakafi to make sure patient has been taking medication. Daughter states in the past 12 days, patient has missed 2 doses.  Denies any concerns.

## 2019-03-11 DIAGNOSIS — N189 Chronic kidney disease, unspecified: Secondary | ICD-10-CM | POA: Insufficient documentation

## 2019-03-11 DIAGNOSIS — N2581 Secondary hyperparathyroidism of renal origin: Secondary | ICD-10-CM | POA: Insufficient documentation

## 2019-03-11 DIAGNOSIS — D631 Anemia in chronic kidney disease: Secondary | ICD-10-CM | POA: Insufficient documentation

## 2019-03-11 DIAGNOSIS — N1832 Chronic kidney disease, stage 3b: Secondary | ICD-10-CM | POA: Insufficient documentation

## 2019-03-12 DIAGNOSIS — D631 Anemia in chronic kidney disease: Secondary | ICD-10-CM | POA: Diagnosis not present

## 2019-03-12 DIAGNOSIS — R809 Proteinuria, unspecified: Secondary | ICD-10-CM | POA: Diagnosis not present

## 2019-03-12 DIAGNOSIS — N1832 Chronic kidney disease, stage 3b: Secondary | ICD-10-CM | POA: Diagnosis not present

## 2019-03-12 DIAGNOSIS — N2581 Secondary hyperparathyroidism of renal origin: Secondary | ICD-10-CM | POA: Diagnosis not present

## 2019-03-23 ENCOUNTER — Other Ambulatory Visit: Payer: Self-pay | Admitting: Hematology and Oncology

## 2019-03-23 DIAGNOSIS — D45 Polycythemia vera: Secondary | ICD-10-CM

## 2019-03-23 NOTE — Telephone Encounter (Signed)
Comprehensive metabolic panel Order: [id number] Status:  Final result  Visible to patient:  Yes (MyChart)  Next appt:  03/29/2019 at 11:30 AM in Oncology ([facility])  Dx:  Polycythemia rubra vera ([place])  Ref Range & Units 3 wk ago  Sodium 135 - 145 mmol/L 137   Potassium 3.5 - 5.1 mmol/L 4.7   Chloride 98 - 111 mmol/L 102   CO2 22 - 32 mmol/L 27   Glucose, Bld 70 - 99 mg/dL 127High    BUN 8 - 23 mg/dL 26High    Creatinine, Ser 0.44 - 1.00 mg/dL 1.24High    Calcium 8.9 - 10.3 mg/dL 9.3   Total Protein 6.5 - 8.1 g/dL 7.3   Albumin 3.5 - 5.0 g/dL 4.0   AST 15 - 41 U/L 18   ALT 0 - 44 U/L 12   Alkaline Phosphatase 38 - 126 U/L 96   Total Bilirubin 0.3 - 1.2 mg/dL 0.1Low    GFR calc non Af Amer >60 mL/min 38Low    GFR calc Af Amer >60 mL/min 44Low    Anion gap 5 - 15 8   Comment: Performed at [facility], [address] [place], [place] [address]  Resulting Agency  [facility]      Specimen Collected: 02/24/19 13:37  Last Resulted: 02/24/19 14:01     Lab Flowsheet   Order Details   View Encounter   Lab and Collection Details   Routing   Result History         Other Results from 02/24/2019  Contains abnormal data CBC with Differential/Platelet  Status:  Final result  Visible to patient:  Yes (MyChart)  Next appt:  03/29/2019 at 11:30 AM in Oncology ([facility])  Dx:  Polycythemia rubra vera ([place]) Order: [id number]  Ref Range & Units 3 wk ago  WBC 4.0 - 10.5 K/uL 15.2High    RBC 3.87 - 5.11 MIL/uL 4.04   Hemoglobin 12.0 - 15.0 g/dL 11.9Low    HCT 36.0 - 46.0 % 37.7   MCV 80.0 - 100.0 fL 93.3   MCH 26.0 - 34.0 pg 29.5   MCHC 30.0 - 36.0 g/dL 31.6   RDW 11.5 - 15.5 % 20.6High    Platelets 150 - 400 K/uL 793High    nRBC 0.0 - 0.2 % 0.0   Neutrophils Relative % % 62   Neutro Abs 1.7 - 7.7 K/uL 9.4High    Lymphocytes Relative % 17   Lymphs Abs 0.7 - 4.0 K/uL 2.6   Monocytes Relative % 8   Monocytes Absolute 0.1 - 1.0 K/uL 1.2High     Eosinophils Relative % 7   Eosinophils Absolute 0.0 - 0.5 K/uL 1.1High    Basophils Relative % 2   Basophils Absolute 0.0 - 0.1 K/uL 0.3High    Immature Granulocytes % 4   Abs Immature Granulocytes 0.00 - 0.07 K/uL 0.60High    Comment: Performed at [facility], [address] [place],  [address]  Resulting Agency  [facility]      Specimen Collected: 02/24/19 13:37  Last Resulted: 02/24/19 13:52

## 2019-03-24 ENCOUNTER — Other Ambulatory Visit: Payer: Self-pay

## 2019-03-24 MED FILL — JAKAFI 5 MG TABLET: 5 | 30 days supply | Qty: 60 | Fill #0

## 2019-03-29 ENCOUNTER — Other Ambulatory Visit: Payer: Self-pay

## 2019-03-29 ENCOUNTER — Inpatient Hospital Stay: Payer: Medicare Other | Attending: Hematology and Oncology

## 2019-03-29 ENCOUNTER — Encounter: Payer: Self-pay | Admitting: Hematology and Oncology

## 2019-03-29 DIAGNOSIS — Z23 Encounter for immunization: Secondary | ICD-10-CM | POA: Diagnosis not present

## 2019-03-29 DIAGNOSIS — N289 Disorder of kidney and ureter, unspecified: Secondary | ICD-10-CM | POA: Diagnosis not present

## 2019-03-29 DIAGNOSIS — D45 Polycythemia vera: Secondary | ICD-10-CM | POA: Diagnosis not present

## 2019-03-29 LAB — COMPREHENSIVE METABOLIC PANEL
ALT: 12 U/L (ref 0–44)
AST: 16 U/L (ref 15–41)
Albumin: 3.9 g/dL (ref 3.5–5.0)
Alkaline Phosphatase: 97 U/L (ref 38–126)
Anion gap: 4 — ABNORMAL LOW (ref 5–15)
BUN: 23 mg/dL (ref 8–23)
CO2: 27 mmol/L (ref 22–32)
Calcium: 9 mg/dL (ref 8.9–10.3)
Chloride: 104 mmol/L (ref 98–111)
Creatinine, Ser: 1.05 mg/dL — ABNORMAL HIGH (ref 0.44–1.00)
GFR calc Af Amer: 54 mL/min — ABNORMAL LOW (ref >60)
GFR calc non Af Amer: 47 mL/min — ABNORMAL LOW (ref >60)
Glucose, Bld: 132 mg/dL — ABNORMAL HIGH (ref 70–99)
Potassium: 3.8 mmol/L (ref 3.5–5.1)
Sodium: 135 mmol/L (ref 135–145)
Total Bilirubin: 0.4 mg/dL (ref 0.3–1.2)
Total Protein: 7 g/dL (ref 6.5–8.1)

## 2019-03-29 LAB — CBC WITH DIFFERENTIAL/PLATELET
Abs Immature Granulocytes: 0.29 10*3/uL — ABNORMAL HIGH (ref 0.00–0.07)
Basophils Absolute: 0.2 10*3/uL — ABNORMAL HIGH (ref 0.0–0.1)
Basophils Relative: 1 %
Eosinophils Absolute: 0.9 10*3/uL — ABNORMAL HIGH (ref 0.0–0.5)
Eosinophils Relative: 6 %
HCT: 37.7 % (ref 36.0–46.0)
Hemoglobin: 11.4 g/dL — ABNORMAL LOW (ref 12.0–15.0)
Immature Granulocytes: 2 %
Lymphocytes Relative: 19 %
Lymphs Abs: 2.5 10*3/uL (ref 0.7–4.0)
MCH: 28.2 pg (ref 26.0–34.0)
MCHC: 30.2 g/dL (ref 30.0–36.0)
MCV: 93.3 fL (ref 80.0–100.0)
Monocytes Absolute: 1.1 10*3/uL — ABNORMAL HIGH (ref 0.1–1.0)
Monocytes Relative: 8 %
Neutro Abs: 8.4 10*3/uL — ABNORMAL HIGH (ref 1.7–7.7)
Neutrophils Relative %: 64 %
Platelets: 641 10*3/uL — ABNORMAL HIGH (ref 150–400)
RBC: 4.04 MIL/uL (ref 3.87–5.11)
RDW: 20.5 % — ABNORMAL HIGH (ref 11.5–15.5)
WBC: 13.3 10*3/uL — ABNORMAL HIGH (ref 4.0–10.5)
nRBC: 0 % (ref 0.0–0.2)

## 2019-03-29 NOTE — Progress Notes (Signed)
No new changes noted today. The patient Name and DOB has been verified in office today.

## 2019-03-29 NOTE — Progress Notes (Signed)
[facility]  [address] [place], [place] [address] Phone: [phone]  Fax: [phone]   Telemedicine Office Visit:  03/30/2019  Referring physician: No ref. provider found  I connected with Jeannine Boga on 03/30/2019 at 11:20 AM by videoconferencing and verified that I was speaking with the correct person using 2 identifiers.  The patient was at home.  I discussed the limitations, risk, security and privacy concerns of performing an evaluation and management service by videoconferencing and the availability of in person appointments.  I also discussed with the patient that there may be a patient responsible charge related to this service.  The patient expressed understanding and agreed to proceed.   Chief Complaint: Laura Wilcox is a 85 y.o. female with polycythemia rubra vera (PV) on Jakafi who is seen for 4 week assessment.   HPI: The patient was last seen in the hematology clinic on 03/01/2019 via telemedicine.  At that time, she was doing well.  Mental status was back to baseline. She had missed several doses of Jakafi.  Hematocrit 37.7, hemoglobin 11.9, platelets 793,000 (goal < 400,000).  BUN was 26 and creatinine 1.24 (CrCl 23 ml/min). She remained on Jakafi.   CBC on 03/29/2019 revealed a hematocrit of 37.7, hemoglobin 11.4, MCV 93.3, platelets 641,000, WBC 13,300 (ANC 8400).  Creatinine was 1.05.  During the interim, she has felt "great". She is taking 2 naps a day for about 45 minutes to an hour. She is tolerating her medication well. Her daughter notes that she is drinking plenty fluids. She has no complaints today.   Her daughter notes that her mother has not received a flu shot this year. I noted she could have her flu shot here at the clinic.   Past Medical History:  Diagnosis Date  . Polycythemia     Past Surgical History:  Procedure Laterality Date  . ABDOMINAL HYSTERECTOMY     complete  . BREAST SURGERY       Family History  Problem Relation Age of Onset  . Heart disease Father   . Cancer Sister        breast  . Cancer Sister        breast  . Alzheimer's disease Sister     Social History:  reports that she quit smoking about 41 years ago. She has a 30.00 pack-year smoking history. She has never used smokeless tobacco. She reports that she does not drink alcohol or use drugs. She is widowed. Patient is retired Sport and exercise psychologist. Patient moved to [facility] in 03/2017. She lives independently in her own apartment in [place]. Her daughter lives next door.Patient denies known exposures to radiation on toxins.Pt's daughter, [name] be reached at [phone]. She lives in [place]. The patient is accompanied by her duaghter today.  Participants in the patient's visit and their role in the encounter included the patient, her daughter, and Vito Berger, CMA, today.  The intake visit was provided by Vito Berger, CMA.   Allergies: No Known Allergies  Current Medications: Current Outpatient Medications  Medication Sig Dispense Refill  . JAKAFI 5 MG tablet TAKE 1 TABLET (5 MG TOTAL) BY MOUTH 2 (TWO) TIMES DAILY. 60 tablet 0  . Multiple Vitamin (MULTIVITAMIN WITH MINERALS) TABS tablet Take 1 tablet by mouth daily. 30 tablet 1  . feeding supplement, ENSURE ENLIVE, (ENSURE ENLIVE) LIQD Take 237 mLs by mouth 2 (two) times daily between meals. (Patient not taking: Reported on 02/01/2019) 237 mL 12   No current facility-administered medications for  this visit.    Review of Systems  Constitutional: Negative for chills, diaphoresis, fever, malaise/fatigue and weight loss.       Feels "great".  Taking 2 naps a day (45 min - 1 hr).  HENT: Positive for hearing loss. Negative for congestion, ear pain, nosebleeds, sinus pain and sore throat.   Eyes: Negative.  Negative for blurred vision, double vision and photophobia.  Respiratory: Negative.  Negative for cough, hemoptysis, sputum production and shortness of  breath.   Cardiovascular: Negative.  Negative for chest pain, palpitations, orthopnea, leg swelling and PND.  Gastrointestinal: Negative.  Negative for abdominal pain, blood in stool, constipation, diarrhea, heartburn, nausea and vomiting.  Genitourinary: Negative.  Negative for dysuria, frequency, hematuria and urgency.  Musculoskeletal: Negative.  Negative for back pain, falls, joint pain, myalgias and neck pain.  Skin: Negative.  Negative for itching and rash.  Neurological: Negative.  Negative for dizziness, tingling, tremors, sensory change, speech change, focal weakness, seizures, weakness and headaches.  Endo/Heme/Allergies: Negative.  Does not bruise/bleed easily.  Psychiatric/Behavioral: Positive for memory loss (improved ). Negative for depression. The patient is not nervous/anxious and does not have insomnia.   All other systems reviewed and are negative.  Performance status (ECOG): 1  Physical Exam  Constitutional: She is oriented to person, place, and time. She appears well-developed and well-nourished. No distress.  HENT:  Head: Normocephalic and atraumatic.  Short dark blonde hair.  Eyes: Conjunctivae and EOM are normal. No scleral icterus.  Blue eyes.  Neurological: She is alert and oriented to person, place, and time.  Skin: She is not diaphoretic.  Psychiatric: She has a normal mood and affect. Her behavior is normal. Judgment and thought content normal.  Nursing note reviewed.   Appointment on 03/29/2019  Component Date Value Ref Range Status  . Sodium 03/29/2019 135  135 - 145 mmol/L Final  . Potassium 03/29/2019 3.8  3.5 - 5.1 mmol/L Final  . Chloride 03/29/2019 104  98 - 111 mmol/L Final  . CO2 03/29/2019 27  22 - 32 mmol/L Final  . Glucose, Bld 03/29/2019 132* 70 - 99 mg/dL Final  . BUN 03/29/2019 23  8 - 23 mg/dL Final  . Creatinine, Ser 03/29/2019 1.05* 0.44 - 1.00 mg/dL Final  . Calcium 03/29/2019 9.0  8.9 - 10.3 mg/dL Final  . Total Protein 03/29/2019  7.0  6.5 - 8.1 g/dL Final  . Albumin 03/29/2019 3.9  3.5 - 5.0 g/dL Final  . AST 03/29/2019 16  15 - 41 U/L Final  . ALT 03/29/2019 12  0 - 44 U/L Final  . Alkaline Phosphatase 03/29/2019 97  38 - 126 U/L Final  . Total Bilirubin 03/29/2019 0.4  0.3 - 1.2 mg/dL Final  . GFR calc non Af Amer 03/29/2019 47* >60 mL/min Final  . GFR calc Af Amer 03/29/2019 54* >60 mL/min Final  . Anion gap 03/29/2019 4* 5 - 15 Final   Performed at [facility], [address] [place], [place] [address]  . WBC 03/29/2019 13.3* 4.0 - 10.5 K/uL Final  . RBC 03/29/2019 4.04  3.87 - 5.11 MIL/uL Final  . Hemoglobin 03/29/2019 11.4* 12.0 - 15.0 g/dL Final  . HCT 03/29/2019 37.7  36.0 - 46.0 % Final  . MCV 03/29/2019 93.3  80.0 - 100.0 fL Final  . MCH 03/29/2019 28.2  26.0 - 34.0 pg Final  . MCHC 03/29/2019 30.2  30.0 - 36.0 g/dL Final  . RDW 03/29/2019 20.5* 11.5 - 15.5 % Final  .  Platelets 03/29/2019 641* 150 - 400 K/uL Final  . nRBC 03/29/2019 0.0  0.0 - 0.2 % Final  . Neutrophils Relative % 03/29/2019 64  % Final  . Neutro Abs 03/29/2019 8.4* 1.7 - 7.7 K/uL Final  . Lymphocytes Relative 03/29/2019 19  % Final  . Lymphs Abs 03/29/2019 2.5  0.7 - 4.0 K/uL Final  . Monocytes Relative 03/29/2019 8  % Final  . Monocytes Absolute 03/29/2019 1.1* 0.1 - 1.0 K/uL Final  . Eosinophils Relative 03/29/2019 6  % Final  . Eosinophils Absolute 03/29/2019 0.9* 0.0 - 0.5 K/uL Final  . Basophils Relative 03/29/2019 1  % Final  . Basophils Absolute 03/29/2019 0.2* 0.0 - 0.1 K/uL Final  . WBC Morphology 03/29/2019 HIDE   Final  . Immature Granulocytes 03/29/2019 2  % Final  . Abs Immature Granulocytes 03/29/2019 0.29* 0.00 - 0.07 K/uL Final   Performed at [facility], [address] [place], [place] [address]    Assessment:  Verble Styron is a 84 y.o. female with polycythemia rubra veraand secondary myelofibrosis. She presented in 04/2002 with thrombocytosis (835,000). JAK2 V617Fwas  positive on 05/20/2012.  Over the past year,hematocrithas ranged 37.5 - 42.4,hemoglobin12.1 - 13.4, platelets458,000 - 569,000, WBC10,500 - 15,700. Creatinine has been 1.0 - 1.2.  Bone marrowon 04/15/2002 revealed a normocellular marrow for age with trilineage hematopoiesis and mild megakaryocytic hyperplasia with focal atypia. There was no morphologic evidence of a myeloproliferative disorder. Iron stains were inadequate for evaluation of storage iron. There was no increase in reticulin fibers. She has been on agrylin since 2012.  Bone marrowon 09/25/2012 revealed a persistent myeloproliferative disorder s/p Agrylin. The current features were best classified as persistent myeloproliferative neoplasm (MPN) JAK2 V617F mutation positive. Marrow was hypercellular for age (30-50%) with increased trilineage hematopoiesis and markedly increased atypical megakaryopoiesis with frequent clustering. There were no increased blasts. Iron stores were absent. There was moderate to severe myelofibrosis(grade MF 2-3). Differential included ET, PV, primary myelofibrosis, and MPN, unclassifiable. Cytogeneticswere normal (63, XX).  She developed pancytopeniain 12/2015. Etiology is unclear (medication confusion or hydroxyurea + Agrylin).  She received Procrit40,000 units (intermittently 09/2012 - 06/2016), Venofer(09/2012 - 10/2012), and Infed(12/2015 - 03/2016). She received Neupogenduring a period of neutropenia. She was anagrelidefrom 2004 - 08/13/2017. She beganJakafion 08/14/2017. Jakafi was increased from 5 mg BID to 10 mg BID on03/18/2020.Jakafi was increased to 15 mg BID on 08/03/2018.  She has been back on Jakafi 5 mg BID since 02/01/2019.  She began a phlebotomy programon 08/06/2017. She undergoes small volume (150 cc with replacement of 150 cc NS)if her HCT >42.  Ferritinhas been followed: 529 on 09/03/2017, 1020 on 12/03/2017, 423 on 02/18/2018, and 858 on  11/17/2018.Iron saturationwas 37%and TIBC 270 on08/18/2020.B12 was 477 and folate 36 on 11/17/2018.  She was admitted to [facility] 12/17/2018 - 12/18/2018. She received 1 unit of PRBCs.CBC at discharge included a hematocrit of 29.0, hemoglobin 9.4, platelets 364,000, and WBC 10,200.  She received a course of Keflexfor a Proteus mirabilisUTIbeginning 01/11/2019.  Symptomatically, she is doing well.  Her daughter notes no concerns.    Plan: 1.   Review labs from 03/29/2019. 2.Polycythemia rubra vera Hematocrit37.7.Hemoglobin11.4.Platelets641,000. Platelet count goal < 400,000. Patient has been back on Jakafi5 mg BID since 02/01/2019. She had missed 11 doses at last visit.  She has been receiving all of her doses.  Platelet count is improving. Continue current dose of Jakafi. Discuss plan to increase Jakafi to 10 mg BID if platelet count plateaus above 400,000.  3.Renal insufficiency BUN23and creatinine 1.05. Continue to encourage good hydration. Continue to monitor. 4.   Health maintenance  RCT this week for influenza vaccine. 5.   RTC in 1 month for MD assessment and labs (CBC with diff, BMP). 6.   RTC in 2 months for MD assessment and labs (CBC with diff, CMP).  I discussed the assessment and treatment plan with the patient.  The patient was provided an opportunity to ask questions and all were answered.  The patient agreed with the plan and demonstrated an understanding of the instructions.  The patient was advised to call back if the symptoms worsen or if the condition fails to improve as anticipated.   Lequita Asal, MD, PhD    03/30/2019, 11:20 AM  I, Selena Batten, am acting as scribe for [employer]. Mike Gip, MD, PhD.  I, Ibtisam Benge C. Mike Gip, MD, have reviewed the above documentation for accuracy and completeness, and I agree with the above.

## 2019-03-30 ENCOUNTER — Inpatient Hospital Stay (HOSPITAL_BASED_OUTPATIENT_CLINIC_OR_DEPARTMENT_OTHER): Payer: Medicare Other | Admitting: Hematology and Oncology

## 2019-03-30 ENCOUNTER — Inpatient Hospital Stay: Payer: Medicare Other

## 2019-03-30 DIAGNOSIS — Z23 Encounter for immunization: Secondary | ICD-10-CM

## 2019-03-30 DIAGNOSIS — Z7189 Other specified counseling: Secondary | ICD-10-CM | POA: Diagnosis not present

## 2019-03-30 DIAGNOSIS — D45 Polycythemia vera: Secondary | ICD-10-CM | POA: Diagnosis not present

## 2019-03-30 DIAGNOSIS — Z0000 Encounter for general adult medical examination without abnormal findings: Secondary | ICD-10-CM

## 2019-03-30 DIAGNOSIS — N289 Disorder of kidney and ureter, unspecified: Secondary | ICD-10-CM | POA: Diagnosis not present

## 2019-03-30 MED ORDER — INFLUENZA VAC A&B SA ADJ QUAD 0.5 ML IM PRSY
0.5000 mL | PREFILLED_SYRINGE | Freq: Once | INTRAMUSCULAR | Status: AC
  Administered 2019-03-30: 14:00:00 0.5 mL via INTRAMUSCULAR

## 2019-03-30 NOTE — Patient Instructions (Signed)
Influenza (Flu) Vaccine (Inactivated or Recombinant): What You Need to Know 1. Why get vaccinated? Influenza vaccine can prevent influenza (flu). Flu is a contagious disease that spreads around the [place] every year, usually between October and May. Anyone can get the flu, but it is more dangerous for some people. Infants and young children, people 83 years of age and older, pregnant women, and people with certain health conditions or a weakened immune system are at greatest risk of flu complications. Pneumonia, bronchitis, sinus infections and ear infections are examples of flu-related complications. If you have a medical condition, such as heart disease, cancer or diabetes, flu can make it worse. Flu can cause fever and chills, sore throat, muscle aches, fatigue, cough, headache, and runny or stuffy nose. Some people may have vomiting and diarrhea, though this is more common in children than adults. Each year thousands of people in the [place] die from flu, and many more are hospitalized. Flu vaccine prevents millions of illnesses and flu-related visits to the doctor each year. 2. Influenza vaccine CDC recommends everyone 57 months of age and older get vaccinated every flu season. Children 6 months through 2 years of age may need 2 doses during a single flu season. Everyone else needs only 1 dose each flu season. It takes about 2 weeks for protection to develop after vaccination. There are many flu viruses, and they are always changing. Each year a new flu vaccine is made to protect against three or four viruses that are likely to cause disease in the upcoming flu season. Even when the vaccine doesn't exactly match these viruses, it may still provide some protection. Influenza vaccine does not cause flu. Influenza vaccine may be given at the same time as other vaccines. 3. Talk with your health care provider Tell your vaccine provider if the person getting the vaccine:  Has had an  allergic reaction after a previous dose of influenza vaccine, or has any severe, life-threatening allergies.  Has ever had Guillain-Barr Syndrome (also called GBS). In some cases, your health care provider may decide to postpone influenza vaccination to a future visit. People with minor illnesses, such as a cold, may be vaccinated. People who are moderately or severely ill should usually wait until they recover before getting influenza vaccine. Your health care provider can give you more information. 4. Risks of a vaccine reaction  Soreness, redness, and swelling where shot is given, fever, muscle aches, and headache can happen after influenza vaccine.  There may be a very small increased risk of Guillain-Barr Syndrome (GBS) after inactivated influenza vaccine (the flu shot). Young children who get the flu shot along with pneumococcal vaccine (PCV13), and/or DTaP vaccine at the same time might be slightly more likely to have a seizure caused by fever. Tell your health care provider if a child who is getting flu vaccine has ever had a seizure. People sometimes faint after medical procedures, including vaccination. Tell your provider if you feel dizzy or have vision changes or ringing in the ears. As with any medicine, there is a very remote chance of a vaccine causing a severe allergic reaction, other serious injury, or death. 5. What if there is a serious problem? An allergic reaction could occur after the vaccinated person leaves the clinic. If you see signs of a severe allergic reaction (hives, swelling of the face and throat, difficulty breathing, a fast heartbeat, dizziness, or weakness), call 9-1-1 and get the person to the nearest hospital. For other signs that  concern you, call your health care provider. Adverse reactions should be reported to the Vaccine Adverse Event Reporting System (VAERS). Your health care provider will usually file this report, or you can do it yourself. Visit the  VAERS website at www.vaers.SamedayNews.es or call (415)248-9531.VAERS is only for reporting reactions, and VAERS staff do not give medical advice. 6. The National Vaccine Injury Compensation Program The [employer] Vaccine Injury Compensation Program (VICP) is a federal program that was created to compensate people who may have been injured by certain vaccines. Visit the VICP website at GoldCloset.com.ee or call 440 295 2135 to learn about the program and about filing a claim. There is a time limit to file a claim for compensation. 7. How can I learn more?  Ask your healthcare provider.  Call your local or state health department.  Contact the Centers for Disease Control and Prevention (CDC): ? Call 408-078-7712 (1-800-CDC-INFO) or ? Visit CDC's https://gibson.com/ Vaccine Information Statement (Interim) Inactivated Influenza Vaccine (11/13/2017) This information is not intended to replace advice given to you by your health care provider. Make sure you discuss any questions you have with your health care provider. Document Released: 01/10/2006 Document Revised: 07/07/2018 Document Reviewed: 11/17/2017 Elsevier Patient Education  [employer]. Influenza, Adult Influenza, more commonly known as "the flu," is a viral infection that mainly affects the respiratory tract. The respiratory tract includes organs that help you breathe, such as the lungs, nose, and throat. The flu causes many symptoms similar to the common cold along with high fever and body aches. The flu spreads easily from person to person (is contagious). Getting a flu shot (influenza vaccination) every year is the best way to prevent the flu. What are the causes? This condition is caused by the influenza virus. You can get the virus by:  Breathing in droplets that are in the air from an infected person's cough or sneeze.  Touching something that has been exposed to the virus (has been contaminated) and then touching your  mouth, nose, or eyes. What increases the risk? The following factors may make you more likely to get the flu:  Not washing or sanitizing your hands often.  Having close contact with many people during cold and flu season.  Touching your mouth, eyes, or nose without first washing or sanitizing your hands.  Not getting a yearly (annual) flu shot. You may have a higher risk for the flu, including serious problems such as a lung infection (pneumonia), if you:  Are older than 65.  Are pregnant.  Have a weakened disease-fighting system (immune system). You may have a weakened immune system if you: ? Have HIV or AIDS. ? Are undergoing chemotherapy. ? Are taking medicines that reduce (suppress) the activity of your immune system.  Have a long-term (chronic) illness, such as heart disease, kidney disease, diabetes, or lung disease.  Have a liver disorder.  Are severely overweight (morbidly obese).  Have anemia. This is a condition that affects your red blood cells.  Have asthma. What are the signs or symptoms? Symptoms of this condition usually begin suddenly and last 4-14 days. They may include:  Fever and chills.  Headaches, body aches, or muscle aches.  Sore throat.  Cough.  Runny or stuffy (congested) nose.  Chest discomfort.  Poor appetite.  Weakness or fatigue.  Dizziness.  Nausea or vomiting. How is this diagnosed? This condition may be diagnosed based on:  Your symptoms and medical history.  A physical exam.  Swabbing your nose or throat and testing  the fluid for the influenza virus. How is this treated? If the flu is diagnosed early, you can be treated with medicine that can help reduce how severe the illness is and how long it lasts (antiviral medicine). This may be given by mouth (orally) or through an IV. Taking care of yourself at home can help relieve symptoms. Your health care provider may recommend:  Taking over-the-counter  medicines.  Drinking plenty of fluids. In many cases, the flu goes away on its own. If you have severe symptoms or complications, you may be treated in a hospital. Follow these instructions at home: Activity  Rest as needed and get plenty of sleep.  Stay home from work or school as told by your health care provider. Unless you are visiting your health care provider, avoid leaving home until your fever has been gone for 24 hours without taking medicine. Eating and drinking  Take an oral rehydration solution (ORS). This is a drink that is sold at pharmacies and retail stores.  Drink enough fluid to keep your urine pale yellow.  Drink clear fluids in small amounts as you are able. Clear fluids include water, ice chips, diluted fruit juice, and low-calorie sports drinks.  Eat bland, easy-to-digest foods in small amounts as you are able. These foods include bananas, applesauce, rice, lean meats, toast, and crackers.  Avoid drinking fluids that contain a lot of sugar or caffeine, such as energy drinks, regular sports drinks, and soda.  Avoid alcohol.  Avoid spicy or fatty foods. General instructions      Take over-the-counter and prescription medicines only as told by your health care provider.  Use a cool mist humidifier to add humidity to the air in your home. This can make it easier to breathe.  Cover your mouth and nose when you cough or sneeze.  Wash your hands with soap and water often, especially after you cough or sneeze. If soap and water are not available, use alcohol-based hand sanitizer.  Keep all follow-up visits as told by your health care provider. This is important. How is this prevented?   Get an annual flu shot. You may get the flu shot in late summer, fall, or winter. Ask your health care provider when you should get your flu shot.  Avoid contact with people who are sick during cold and flu season. This is generally fall and winter. Contact a health care  provider if:  You develop new symptoms.  You have: ? Chest pain. ? Diarrhea. ? A fever.  Your cough gets worse.  You produce more mucus.  You feel nauseous or you vomit. Get help right away if:  You develop shortness of breath or difficulty breathing.  Your skin or nails turn a bluish color.  You have severe pain or stiffness in your neck.  You develop a sudden headache or sudden pain in your face or ear.  You cannot eat or drink without vomiting. Summary  Influenza, more commonly known as "the flu," is a viral infection that primarily affects your respiratory tract.  Symptoms of the flu usually begin suddenly and last 4-14 days.  Getting an annual flu shot is the best way to prevent getting the flu.  Stay home from work or school as told by your health care provider. Unless you are visiting your health care provider, avoid leaving home until your fever has been gone for 24 hours without taking medicine.  Keep all follow-up visits as told by your health care provider. This is  important. This information is not intended to replace advice given to you by your health care provider. Make sure you discuss any questions you have with your health care provider. Document Released: 03/15/2000 Document Revised: 06/18/2018 Document Reviewed: 09/03/2017 Elsevier Patient Education  2020 [employer].

## 2019-04-11 DIAGNOSIS — Z0000 Encounter for general adult medical examination without abnormal findings: Secondary | ICD-10-CM | POA: Insufficient documentation

## 2019-04-20 ENCOUNTER — Other Ambulatory Visit: Payer: Self-pay | Admitting: Hematology and Oncology

## 2019-04-20 DIAGNOSIS — D45 Polycythemia vera: Secondary | ICD-10-CM

## 2019-04-25 NOTE — Progress Notes (Signed)
[facility]  [address], [address] [place], [place] [address] Phone: [phone]  Fax: [phone]   Telemedicine Visit:  04/28/2019  Referring physician: No ref. provider found  I connected with Jeannine Boga on 04/28/19 at 3:28 PM by videoconferencing and verified that I was speaking with the correct person using 2 identifiers.  The patient was at home.  I discussed the limitations, risk, security and privacy concerns of performing an evaluation and management service by videoconferencing and the availability of in person appointments.  I also discussed with the patient that there may be a patient responsible charge related to this service.  The patient expressed understanding and agreed to proceed.   Chief Complaint: Laura Wilcox is a 86 y.o. female with polycythemia rubra vera (PV)on Jakafi who is seen for 4 week assessment.   HPI: The patient was last seen in the hematology clinic on 03/30/2019.  At that time, she was doing well. Her daughter noted no concerns. Hematocrit 37.7, hemoglobin 11.4, MCV 93.3, platelets 641,000, WBC 13300, ANC 8400. Creatinine was 1.05.  As her platelet count continued to slowly decline (goal 400,000), decision was made to continue Jakafi 5 mg BID.  She received the influenza vaccine.  Labs on 04/27/2019 revealed a hematocrit 38.1, hemoglobin 11.8, MCV 88.8, platelets 618,000, WBC 12100, ANC 8000.  Creatinine 1.12.  During the interim, she has been "great".  She still takes daily naps. Her daughter says she takes 2, 1 in the morning and 1 in the afternoon. She has stayed busy at home.   Daughter says she oversees her mothers medication. The pharmacy will call to get a count to see if any dosages have been missed. None have been missed since her last visit.   She has been drinking her daily intake of fluids.  Her weight is stable.    Past Medical History:  Diagnosis Date  . Polycythemia     Past Surgical History:    Procedure Laterality Date  . ABDOMINAL HYSTERECTOMY     complete  . BREAST SURGERY      Family History  Problem Relation Age of Onset  . Heart disease Father   . Cancer Sister        breast  . Cancer Sister        breast  . Alzheimer's disease Sister     Social History:  reports that she quit smoking about 41 years ago. She has a 30.00 pack-year smoking history. She has never used smokeless tobacco. She reports that she does not drink alcohol or use drugs. She is widowed. Patient is retired Sport and exercise psychologist. Patient moved to [facility] in 03/2017. She lives independently in her own apartment in [place]. Her daughter lives next door.Patient denies known exposures to radiation on toxins.Pt's daughter, [name] be reached at [phone]. She lives in [place].  The patient is accompanied by her daughter today.  Participants in the patient's visit and their role in the encounter included the patient, Samul Dada, scribe, and [employer], CMA, today.  The intake visit was provided by Samul Dada, scribe, and Vito Berger, CMA.   Allergies: No Known Allergies  Current Medications: Current Outpatient Medications  Medication Sig Dispense Refill  . JAKAFI 5 MG tablet TAKE 1 TABLET (5 MG TOTAL) BY MOUTH 2 (TWO) TIMES DAILY. 60 tablet 0  . Multiple Vitamin (MULTIVITAMIN WITH MINERALS) TABS tablet Take 1 tablet by mouth daily. 30 tablet 1  . feeding supplement, ENSURE ENLIVE, (ENSURE ENLIVE) LIQD Take 237 mLs by  mouth 2 (two) times daily between meals. (Patient not taking: Reported on 02/01/2019) 237 mL 12   No current facility-administered medications for this visit.    Review of Systems  Constitutional: Negative.  Negative for chills, diaphoresis, fever, malaise/fatigue and weight loss.       Feels "great".  Taking 2 naps a day (45 min - 1 hr).  HENT: Positive for hearing loss. Negative for congestion, ear pain, nosebleeds, sinus pain and sore throat.   Eyes: Negative.  Negative for  blurred vision, double vision and photophobia.  Respiratory: Negative.  Negative for cough, hemoptysis, sputum production and shortness of breath.   Cardiovascular: Negative.  Negative for chest pain, palpitations, orthopnea, leg swelling and PND.  Gastrointestinal: Negative.  Negative for abdominal pain, blood in stool, constipation, diarrhea, heartburn, nausea and vomiting.  Genitourinary: Negative.  Negative for dysuria, frequency, hematuria and urgency.  Musculoskeletal: Negative.  Negative for back pain, falls, joint pain, myalgias and neck pain.  Skin: Negative.  Negative for itching and rash.  Neurological: Negative.  Negative for dizziness, tingling, tremors, sensory change, speech change, focal weakness, seizures, weakness and headaches.  Endo/Heme/Allergies: Negative.  Does not bruise/bleed easily.  Psychiatric/Behavioral: Positive for memory loss (improved ). Negative for depression. The patient is not nervous/anxious and does not have insomnia.   All other systems reviewed and are negative.  Performance status (ECOG):  1  Vitals There were no vitals taken for this visit.   Physical Exam  Constitutional: She is oriented to person, place, and time.  HENT:  Head: Normocephalic and atraumatic.  Short dark blonde hair.  Eyes: Conjunctivae and EOM are normal. No scleral icterus.  Blue eyes.  Neurological: She is alert and oriented to person, place, and time.  Psychiatric: She has a normal mood and affect. Her behavior is normal. Judgment and thought content normal.  Nursing note reviewed.   Appointment on 04/27/2019  Component Date Value Ref Range Status  . WBC 04/27/2019 12.1* 4.0 - 10.5 K/uL Final  . RBC 04/27/2019 4.29  3.87 - 5.11 MIL/uL Final  . Hemoglobin 04/27/2019 11.8* 12.0 - 15.0 g/dL Final  . HCT 04/27/2019 38.1  36.0 - 46.0 % Final  . MCV 04/27/2019 88.8  80.0 - 100.0 fL Final  . MCH 04/27/2019 27.5  26.0 - 34.0 pg Final  . MCHC 04/27/2019 31.0  30.0 - 36.0 g/dL  Final  . RDW 04/27/2019 21.4* 11.5 - 15.5 % Final  . Platelets 04/27/2019 618* 150 - 400 K/uL Final  . nRBC 04/27/2019 0.0  0.0 - 0.2 % Final  . Neutrophils Relative % 04/27/2019 65  % Final  . Neutro Abs 04/27/2019 8.0* 1.7 - 7.7 K/uL Final  . Lymphocytes Relative 04/27/2019 16  % Final  . Lymphs Abs 04/27/2019 1.9  0.7 - 4.0 K/uL Final  . Monocytes Relative 04/27/2019 9  % Final  . Monocytes Absolute 04/27/2019 1.1* 0.1 - 1.0 K/uL Final  . Eosinophils Relative 04/27/2019 6  % Final  . Eosinophils Absolute 04/27/2019 0.7* 0.0 - 0.5 K/uL Final  . Basophils Relative 04/27/2019 2  % Final  . Basophils Absolute 04/27/2019 0.2* 0.0 - 0.1 K/uL Final  . Immature Granulocytes 04/27/2019 2  % Final  . Abs Immature Granulocytes 04/27/2019 0.24* 0.00 - 0.07 K/uL Final   Performed at [facility], [address] [place], [place] [address]  . Sodium 04/27/2019 137  135 - 145 mmol/L Final  . Potassium 04/27/2019 4.1  3.5 - 5.1 mmol/L Final  .  Chloride 04/27/2019 101  98 - 111 mmol/L Final  . CO2 04/27/2019 27  22 - 32 mmol/L Final  . Glucose, Bld 04/27/2019 91  70 - 99 mg/dL Final  . BUN 04/27/2019 26* 8 - 23 mg/dL Final  . Creatinine, Ser 04/27/2019 1.12* 0.44 - 1.00 mg/dL Final  . Calcium 04/27/2019 9.3  8.9 - 10.3 mg/dL Final  . GFR calc non Af Amer 04/27/2019 43* >60 mL/min Final  . GFR calc Af Amer 04/27/2019 50* >60 mL/min Final  . Anion gap 04/27/2019 9  5 - 15 Final   Performed at [facility], [address], [place], [place] [address]    Assessment:  Shimika Ames is a 86 y.o. female with polycythemia rubra veraand secondary myelofibrosis. She presented in 04/2002 with thrombocytosis (835,000). JAK2 V617Fwas positive on 05/20/2012.  Over the past year,hematocrithas ranged 37.5 - 42.4,hemoglobin12.1 - 13.4, platelets458,000 - 569,000, WBC10,500 - 15,700. Creatinine has been 1.0 - 1.2.  Bone marrowon 04/15/2002 revealed a normocellular marrow  for age with trilineage hematopoiesis and mild megakaryocytic hyperplasia with focal atypia. There was no morphologic evidence of a myeloproliferative disorder. Iron stains were inadequate for evaluation of storage iron. There was no increase in reticulin fibers. She has been on agrylin since 2012.  Bone marrowon 09/25/2012 revealed a persistent myeloproliferative disorder s/p Agrylin. The current features were best classified as persistent myeloproliferative neoplasm (MPN) JAK2 V617F mutation positive. Marrow was hypercellular for age (30-50%) with increased trilineage hematopoiesis and markedly increased atypical megakaryopoiesis with frequent clustering. There were no increased blasts. Iron stores were absent. There was moderate to severe myelofibrosis(grade MF 2-3). Differential included ET, PV, primary myelofibrosis, and MPN, unclassifiable. Cytogeneticswere normal (62, XX).  She developed pancytopeniain 12/2015. Etiology is unclear (medication confusion or hydroxyurea + Agrylin).  She received Procrit40,000 units (intermittently 09/2012 - 06/2016), Venofer(09/2012 - 10/2012), and Infed(12/2015 - 03/2016). She received Neupogenduring a period of neutropenia. She was anagrelidefrom 2004 - 08/13/2017. She beganJakafion 08/14/2017. Jakafi was increased from 5 mg BID to 10 mg BID on03/18/2020.Jakafi was increased to 15 mg BID on 08/03/2018.She has been back on Jakafi 5 mg BID since 02/01/2019.  She began a phlebotomy programon 08/06/2017. She undergoes small volume (150 cc with replacement of 150 cc NS)if her HCT >42.  Ferritinhas been followed: 529 on 09/03/2017, 1020 on 12/03/2017, 423 on 02/18/2018, and 858 on 11/17/2018.Iron saturationwas 37%and TIBC 270 on08/18/2020.B12 was 477 and folate 36 on 11/17/2018.  She was admitted to [facility] 12/17/2018 - 12/18/2018. She received 1 unit of PRBCs.CBC at discharge included a hematocrit of 29.0, hemoglobin  9.4, platelets 364,000, and WBC 10,200.  She received a course of Keflexfor a Proteus mirabilisUTIbeginning 01/11/2019.  Symptomatically, she is doing well.  She voices no concerns.  Plan: 1.   Review labs from 04/27/2019. 2.Polycythemia rubra vera Hematocrit38.1.Hemoglobin11.8.Platelets618,000. Platelet count goal < 400,000. Patient has been back onJakafi5 mg BIDsince 02/01/2019. She has missed no doses since her last visit.             Platelet count is slowly improving. Continue current dose of Jakafi. Plan is to increase Jakafi to 10 mg p.o. BID if platelet count plateaus above 400,000. 3.Renal insufficiency BUN26and creatinine 1.12. Patient is drinking fluids well. Continue to monitor. 4.RTC in 1 month for labs (CBC with differential, BMP). 5.   RTC in 2 months for MD assessment and labs (CBC with diff, CMP).  I discussed the assessment and treatment plan with the patient.  The patient was provided  an opportunity to ask questions and all were answered.  The patient agreed with the plan and demonstrated an understanding of the instructions.  The patient was advised to call back if the symptoms worsen or if the condition fails to improve as anticipated.   Lequita Asal, MD, PhD    04/28/2019, 3:44 PM  I, Samul Dada, am acting as a scribe for Lequita Asal, MD.  I, Moville Mike Gip, MD, have reviewed the above documentation for accuracy and completeness, and I agree with the above.

## 2019-04-27 ENCOUNTER — Encounter: Payer: Self-pay | Admitting: Hematology and Oncology

## 2019-04-27 ENCOUNTER — Other Ambulatory Visit: Payer: Self-pay

## 2019-04-27 ENCOUNTER — Inpatient Hospital Stay: Payer: Medicare Other | Attending: Hematology and Oncology

## 2019-04-27 DIAGNOSIS — D45 Polycythemia vera: Secondary | ICD-10-CM | POA: Diagnosis not present

## 2019-04-27 LAB — BASIC METABOLIC PANEL
Anion gap: 9 (ref 5–15)
BUN: 26 mg/dL — ABNORMAL HIGH (ref 8–23)
CO2: 27 mmol/L (ref 22–32)
Calcium: 9.3 mg/dL (ref 8.9–10.3)
Chloride: 101 mmol/L (ref 98–111)
Creatinine, Ser: 1.12 mg/dL — ABNORMAL HIGH (ref 0.44–1.00)
GFR calc Af Amer: 50 mL/min — ABNORMAL LOW (ref >60)
GFR calc non Af Amer: 43 mL/min — ABNORMAL LOW (ref >60)
Glucose, Bld: 91 mg/dL (ref 70–99)
Potassium: 4.1 mmol/L (ref 3.5–5.1)
Sodium: 137 mmol/L (ref 135–145)

## 2019-04-27 LAB — CBC WITH DIFFERENTIAL/PLATELET
Abs Immature Granulocytes: 0.24 10*3/uL — ABNORMAL HIGH (ref 0.00–0.07)
Basophils Absolute: 0.2 10*3/uL — ABNORMAL HIGH (ref 0.0–0.1)
Basophils Relative: 2 %
Eosinophils Absolute: 0.7 10*3/uL — ABNORMAL HIGH (ref 0.0–0.5)
Eosinophils Relative: 6 %
HCT: 38.1 % (ref 36.0–46.0)
Hemoglobin: 11.8 g/dL — ABNORMAL LOW (ref 12.0–15.0)
Immature Granulocytes: 2 %
Lymphocytes Relative: 16 %
Lymphs Abs: 1.9 10*3/uL (ref 0.7–4.0)
MCH: 27.5 pg (ref 26.0–34.0)
MCHC: 31 g/dL (ref 30.0–36.0)
MCV: 88.8 fL (ref 80.0–100.0)
Monocytes Absolute: 1.1 10*3/uL — ABNORMAL HIGH (ref 0.1–1.0)
Monocytes Relative: 9 %
Neutro Abs: 8 10*3/uL — ABNORMAL HIGH (ref 1.7–7.7)
Neutrophils Relative %: 65 %
Platelets: 618 10*3/uL — ABNORMAL HIGH (ref 150–400)
RBC: 4.29 MIL/uL (ref 3.87–5.11)
RDW: 21.4 % — ABNORMAL HIGH (ref 11.5–15.5)
WBC: 12.1 10*3/uL — ABNORMAL HIGH (ref 4.0–10.5)
nRBC: 0 % (ref 0.0–0.2)

## 2019-04-27 NOTE — Progress Notes (Signed)
No new changes noted today. The patient Name and DOB has been verified by phone today by [name]. ( daughter)

## 2019-04-28 ENCOUNTER — Encounter: Payer: Self-pay | Admitting: Hematology and Oncology

## 2019-04-28 ENCOUNTER — Inpatient Hospital Stay (HOSPITAL_BASED_OUTPATIENT_CLINIC_OR_DEPARTMENT_OTHER): Payer: Medicare Other | Admitting: Hematology and Oncology

## 2019-04-28 DIAGNOSIS — D45 Polycythemia vera: Secondary | ICD-10-CM

## 2019-04-28 DIAGNOSIS — N289 Disorder of kidney and ureter, unspecified: Secondary | ICD-10-CM | POA: Diagnosis not present

## 2019-05-03 MED FILL — JAKAFI 5 MG TABLET: 5 | 30 days supply | Qty: 60 | Fill #0

## 2019-05-24 ENCOUNTER — Other Ambulatory Visit: Payer: Self-pay

## 2019-05-25 ENCOUNTER — Telehealth: Payer: Self-pay | Admitting: Hematology and Oncology

## 2019-05-25 ENCOUNTER — Other Ambulatory Visit: Payer: Self-pay | Admitting: Hematology and Oncology

## 2019-05-25 ENCOUNTER — Inpatient Hospital Stay: Payer: Medicare Other | Attending: Hematology and Oncology

## 2019-05-25 DIAGNOSIS — D45 Polycythemia vera: Secondary | ICD-10-CM | POA: Diagnosis not present

## 2019-05-25 LAB — COMPREHENSIVE METABOLIC PANEL
ALT: 13 U/L (ref 0–44)
AST: 19 U/L (ref 15–41)
Albumin: 4.2 g/dL (ref 3.5–5.0)
Alkaline Phosphatase: 90 U/L (ref 38–126)
Anion gap: 9 (ref 5–15)
BUN: 27 mg/dL — ABNORMAL HIGH (ref 8–23)
CO2: 26 mmol/L (ref 22–32)
Calcium: 9.4 mg/dL (ref 8.9–10.3)
Chloride: 97 mmol/L — ABNORMAL LOW (ref 98–111)
Creatinine, Ser: 1.07 mg/dL — ABNORMAL HIGH (ref 0.44–1.00)
GFR calc Af Amer: 53 mL/min — ABNORMAL LOW (ref >60)
GFR calc non Af Amer: 46 mL/min — ABNORMAL LOW (ref >60)
Glucose, Bld: 81 mg/dL (ref 70–99)
Potassium: 4.4 mmol/L (ref 3.5–5.1)
Sodium: 132 mmol/L — ABNORMAL LOW (ref 135–145)
Total Bilirubin: 0.5 mg/dL (ref 0.3–1.2)
Total Protein: 7.6 g/dL (ref 6.5–8.1)

## 2019-05-25 LAB — CBC WITH DIFFERENTIAL/PLATELET
Abs Immature Granulocytes: 0.54 10*3/uL — ABNORMAL HIGH (ref 0.00–0.07)
Basophils Absolute: 0.2 10*3/uL — ABNORMAL HIGH (ref 0.0–0.1)
Basophils Relative: 2 %
Eosinophils Absolute: 0.7 10*3/uL — ABNORMAL HIGH (ref 0.0–0.5)
Eosinophils Relative: 5 %
HCT: 39 % (ref 36.0–46.0)
Hemoglobin: 12.2 g/dL (ref 12.0–15.0)
Immature Granulocytes: 4 %
Lymphocytes Relative: 13 %
Lymphs Abs: 1.8 10*3/uL (ref 0.7–4.0)
MCH: 27.3 pg (ref 26.0–34.0)
MCHC: 31.3 g/dL (ref 30.0–36.0)
MCV: 87.2 fL (ref 80.0–100.0)
Monocytes Absolute: 1.3 10*3/uL — ABNORMAL HIGH (ref 0.1–1.0)
Monocytes Relative: 9 %
Neutro Abs: 9.6 10*3/uL — ABNORMAL HIGH (ref 1.7–7.7)
Neutrophils Relative %: 67 %
Platelets: 629 10*3/uL — ABNORMAL HIGH (ref 150–400)
RBC: 4.47 MIL/uL (ref 3.87–5.11)
RDW: 21.8 % — ABNORMAL HIGH (ref 11.5–15.5)
WBC: 14.2 10*3/uL — ABNORMAL HIGH (ref 4.0–10.5)
nRBC: 0 % (ref 0.0–0.2)

## 2019-05-25 MED ORDER — RUXOLITINIB PHOSPHATE 10 MG PO TABS: ORAL_TABLET | ORAL | 1 refills | Status: DC

## 2019-05-25 NOTE — Telephone Encounter (Signed)
[name] (daughter) called and said that Courtany did have some Jakfi pills and would start 10 mg tonight.

## 2019-05-25 NOTE — Telephone Encounter (Signed)
Re:  Platelet count  Called patient's daughter, Laura Wilcox, regarding her platelet count 629,000.  Platelets have drifted up.  Platelet goal 400,000.  Discussed increasing Jakafi from 5 mg BID to 10 mg BID.  She will be checking her medications at home.   Lequita Asal, MD

## 2019-05-26 ENCOUNTER — Telehealth: Payer: Medicare Other | Admitting: Hematology and Oncology

## 2019-06-04 ENCOUNTER — Telehealth: Payer: Self-pay | Admitting: Pharmacy Technician

## 2019-06-04 MED FILL — JAKAFI 10 MG TABLET: 10 | 30 days supply | Qty: 60 | Fill #0

## 2019-06-04 NOTE — Telephone Encounter (Signed)
Oral Oncology Patient Advocate Encounter   Was successful in obtaining a second grant for $8,500 from Patient [facility]) to provide copayment coverage for Jakafi.  This will keep the out of pocket expense at $0 until current grants expiration date.    The billing information is as follows and has been shared with [place].   Member ID: [id number] Group ID: [id number]  RxBin: [id number] Dates of Eligibility: 06/04/2019 through 08/08/2019  Fund:  [place] [facility]el[facility] [place] Patient [place] Phone [phone] Fax [phone] 06/04/2019 10:01 AM

## 2019-06-22 ENCOUNTER — Inpatient Hospital Stay: Payer: Medicare Other | Attending: Hematology and Oncology

## 2019-06-22 ENCOUNTER — Other Ambulatory Visit: Payer: Self-pay

## 2019-06-22 DIAGNOSIS — D45 Polycythemia vera: Secondary | ICD-10-CM | POA: Insufficient documentation

## 2019-06-22 LAB — CBC WITH DIFFERENTIAL/PLATELET
Abs Immature Granulocytes: 0.73 10*3/uL — ABNORMAL HIGH (ref 0.00–0.07)
Basophils Absolute: 0.2 10*3/uL — ABNORMAL HIGH (ref 0.0–0.1)
Basophils Relative: 2 %
Eosinophils Absolute: 0.6 10*3/uL — ABNORMAL HIGH (ref 0.0–0.5)
Eosinophils Relative: 5 %
HCT: 36.8 % (ref 36.0–46.0)
Hemoglobin: 11.4 g/dL — ABNORMAL LOW (ref 12.0–15.0)
Immature Granulocytes: 6 %
Lymphocytes Relative: 21 %
Lymphs Abs: 2.5 10*3/uL (ref 0.7–4.0)
MCH: 27 pg (ref 26.0–34.0)
MCHC: 31 g/dL (ref 30.0–36.0)
MCV: 87 fL (ref 80.0–100.0)
Monocytes Absolute: 1.2 10*3/uL — ABNORMAL HIGH (ref 0.1–1.0)
Monocytes Relative: 10 %
Neutro Abs: 6.7 10*3/uL (ref 1.7–7.7)
Neutrophils Relative %: 56 %
Platelets: 649 10*3/uL — ABNORMAL HIGH (ref 150–400)
RBC: 4.23 MIL/uL (ref 3.87–5.11)
RDW: 22.6 % — ABNORMAL HIGH (ref 11.5–15.5)
WBC: 11.8 10*3/uL — ABNORMAL HIGH (ref 4.0–10.5)
nRBC: 0 % (ref 0.0–0.2)

## 2019-06-22 LAB — COMPREHENSIVE METABOLIC PANEL
ALT: 13 U/L (ref 0–44)
AST: 20 U/L (ref 15–41)
Albumin: 4.1 g/dL (ref 3.5–5.0)
Alkaline Phosphatase: 83 U/L (ref 38–126)
Anion gap: 10 (ref 5–15)
BUN: 35 mg/dL — ABNORMAL HIGH (ref 8–23)
CO2: 24 mmol/L (ref 22–32)
Calcium: 9.2 mg/dL (ref 8.9–10.3)
Chloride: 99 mmol/L (ref 98–111)
Creatinine, Ser: 1.19 mg/dL — ABNORMAL HIGH (ref 0.44–1.00)
GFR calc Af Amer: 47 mL/min — ABNORMAL LOW (ref >60)
GFR calc non Af Amer: 40 mL/min — ABNORMAL LOW (ref >60)
Glucose, Bld: 78 mg/dL (ref 70–99)
Potassium: 4.7 mmol/L (ref 3.5–5.1)
Sodium: 133 mmol/L — ABNORMAL LOW (ref 135–145)
Total Bilirubin: 0.5 mg/dL (ref 0.3–1.2)
Total Protein: 6.9 g/dL (ref 6.5–8.1)

## 2019-06-22 NOTE — Progress Notes (Signed)
[facility]  [address] [place], [place] [address] Phone: [phone]  Fax: [phone]   Telemedicine Office Visit:  06/23/2019  Referring physician: No ref. provider found  I connected with Laura Wilcox on 06/22/19 at 11:42 PM by videoconferencing and verified that I was speaking with the correct person using 2 identifiers.  The patient was at  home.  I discussed the limitations, risk, security and privacy concerns of performing an evaluation and management service by videoconferencing and the availability of in person appointments.  I also discussed with the patient that there may be a patient responsible charge related to this service.  The patient expressed understanding and agreed to proceed.    Chief Complaint: Laura Wilcox is a 86 y.o. female with polycythemia rubra vera (PV)on Jakafi who is seen for 2 month assessment.   HPI: The patient was last seen virtually in the hematology clinic on 04/28/2019. At that time, she was doing well.  She voiced no concerns.  She continued Jakafi 5 mg BID.  CBS followed: 04/27/2019: Hematocrit 38.1, hemoglobin 11.8, MCV 88.8, platelets 618,000, WBC 12,100, ANC 1,900.   05/25/2019: Hematocrit 39.0, hemoglobin 12.2, MCV 87.2, platelets 629,000, WBC 14,200, ANC 1,800.   06/22/2019: Hematocrit 36.8, hemoglobin 11.4, MCV 87.0, platelets 649,000, WBC 11,800, ANC 2,500.    Jakafi was increased to 10 mg BID on 05/25/2019.  During the interim, she says she has been doing great and there have been no problems. She has been active and usually takes up to one nap a day, if she doesn't have to go anywhere during the day.   She has been regularly taking Jakafi everyday but she forgot to take her evening dose yesterday. Her daughter says she has missed a total of 14 doses of Jakafi.   Her daughter says she has been regularly walking since she got her second dose of the COVID-19 vaccine three weeks ago.    Past  Medical History:  Diagnosis Date  . Polycythemia     Past Surgical History:  Procedure Laterality Date  . ABDOMINAL HYSTERECTOMY     complete  . BREAST SURGERY      Family History  Problem Relation Age of Onset  . Heart disease Father   . Cancer Sister        breast  . Cancer Sister        breast  . Alzheimer's disease Sister     Social History:  reports that she quit smoking about 41 years ago. She has a 30.00 pack-year smoking history. She has never used smokeless tobacco. She reports that she does not drink alcohol or use drugs. She is widowed. 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 She lives independently in her own apartment in [place]. Her daughter lives next door.Patient denies known exposures to radiation on toxins.Pt's daughter, Laura Wilcox be reached at [phone]. She lives in [place].  The patient is accompanied by her daughter today.   Participants in the patient's visit and their role in the encounter included the patient, Laura Wilcox, Laura Wilcox, and Laura [employer], Laura Wilcox  today.  The intake visit was provided by Laura Shropshire,Laura Wilcox.    Allergies: No Known Allergies  Current Medications: Current Outpatient Medications  Medication Sig Dispense Refill  . calcitRIOL (ROCALTROL) 0.25 MCG capsule Take 0.25 mcg by mouth 3 (three) times a week.    . ruxolitinib phosphate (JAKAFI) 10 MG tablet TAKE 1 TABLET (10 MG TOTAL) BY MOUTH 2 TIMES DAILY.  60 tablet 1  . JAKAFI 5 MG tablet TAKE 1 TABLET (5 MG TOTAL) BY MOUTH 2 (TWO) TIMES DAILY. (Patient not taking: Reported on 06/22/2019) 60 tablet 0   No current facility-administered medications for this visit.    Review of Systems  Constitutional: Negative.  Negative for chills, diaphoresis, fever, malaise/fatigue and weight loss.       Feels "great".  Taking 1 nap a day (45 min - 1 hr).  HENT: Positive for hearing loss. Negative for congestion, ear pain, nosebleeds, sinus pain and sore throat.     Eyes: Negative.  Negative for blurred vision, double vision and photophobia.  Respiratory: Negative.  Negative for cough, hemoptysis, sputum production and shortness of breath.   Cardiovascular: Negative.  Negative for chest pain, palpitations, orthopnea, leg swelling and PND.  Gastrointestinal: Negative.  Negative for abdominal pain, blood in stool, constipation, diarrhea, heartburn, nausea and vomiting.  Genitourinary: Negative.  Negative for dysuria, frequency, hematuria and urgency.  Musculoskeletal: Negative.  Negative for back pain, falls, joint pain, myalgias and neck pain.  Skin: Negative.  Negative for itching and rash.  Neurological: Negative.  Negative for dizziness, tingling, tremors, sensory change, speech change, focal weakness, weakness and headaches.  Endo/Heme/Allergies: Negative.  Does not bruise/bleed easily.  Psychiatric/Behavioral: Positive for memory loss (improved ). Negative for depression. The patient is not nervous/anxious and does not have insomnia.   All other systems reviewed and are negative.  Performance status (ECOG): 1 - Symptomatic but completely ambulatory   Vitals There were no vitals taken for this visit.   Physical Exam  Constitutional: She is oriented to person, place, and time.  HENT:  Head: Normocephalic and atraumatic.  Short dark blonde hair.  Eyes: Conjunctivae and EOM are normal. No scleral icterus.  Blue eyes.  Neurological: She is alert and oriented to person, place, and time.  Psychiatric: She has a normal mood and affect. Her behavior is normal. Judgment and thought content normal.  Nursing note reviewed.   Appointment on 06/22/2019  Component Date Value Ref Range Status  . Sodium 06/22/2019 133* 135 - 145 mmol/L Final  . Potassium 06/22/2019 4.7  3.5 - 5.1 mmol/L Final  . Chloride 06/22/2019 99  98 - 111 mmol/L Final  . CO2 06/22/2019 24  22 - 32 mmol/L Final  . Glucose, Bld 06/22/2019 78  70 - 99 mg/dL Final   Glucose reference  range applies only to samples taken after fasting for at least 8 hours.  . BUN 06/22/2019 35* 8 - 23 mg/dL Final  . Creatinine, Ser 06/22/2019 1.19* 0.44 - 1.00 mg/dL Final  . Calcium 06/22/2019 9.2  8.9 - 10.3 mg/dL Final  . Total Protein 06/22/2019 6.9  6.5 - 8.1 g/dL Final  . Albumin 06/22/2019 4.1  3.5 - 5.0 g/dL Final  . AST 06/22/2019 20  15 - 41 U/L Final  . ALT 06/22/2019 13  0 - 44 U/L Final  . Alkaline Phosphatase 06/22/2019 83  38 - 126 U/L Final  . Total Bilirubin 06/22/2019 0.5  0.3 - 1.2 mg/dL Final  . GFR calc non Af Amer 06/22/2019 40* >60 mL/min Final  . GFR calc Af Amer 06/22/2019 47* >60 mL/min Final  . Anion gap 06/22/2019 10  5 - 15 Final   Performed at [facility], [address], [place], [place] [address]  . WBC 06/22/2019 11.8* 4.0 - 10.5 K/uL Final  . RBC 06/22/2019 4.23  3.87 - 5.11 MIL/uL Final  . Hemoglobin 06/22/2019  11.4* 12.0 - 15.0 g/dL Final  . HCT 06/22/2019 36.8  36.0 - 46.0 % Final  . MCV 06/22/2019 87.0  80.0 - 100.0 fL Final  . MCH 06/22/2019 27.0  26.0 - 34.0 pg Final  . MCHC 06/22/2019 31.0  30.0 - 36.0 g/dL Final  . RDW 06/22/2019 22.6* 11.5 - 15.5 % Final  . Platelets 06/22/2019 649* 150 - 400 K/uL Final  . nRBC 06/22/2019 0.0  0.0 - 0.2 % Final  . Neutrophils Relative % 06/22/2019 56  % Final  . Neutro Abs 06/22/2019 6.7  1.7 - 7.7 K/uL Final  . Lymphocytes Relative 06/22/2019 21  % Final  . Lymphs Abs 06/22/2019 2.5  0.7 - 4.0 K/uL Final  . Monocytes Relative 06/22/2019 10  % Final  . Monocytes Absolute 06/22/2019 1.2* 0.1 - 1.0 K/uL Final  . Eosinophils Relative 06/22/2019 5  % Final  . Eosinophils Absolute 06/22/2019 0.6* 0.0 - 0.5 K/uL Final  . Basophils Relative 06/22/2019 2  % Final  . Basophils Absolute 06/22/2019 0.2* 0.0 - 0.1 K/uL Final  . Immature Granulocytes 06/22/2019 6  % Final  . Abs Immature Granulocytes 06/22/2019 0.73* 0.00 - 0.07 K/uL Final   Performed at [facility], [address] [place], [place] [address]    Assessment:  Laura Wilcox is a 86 y.o. female with polycythemia rubra veraand secondary myelofibrosis. She presented in 04/2002 with thrombocytosis (835,000). JAK2 V617Fwas positive on 05/20/2012.  Over the past year,hematocrithas ranged 37.5 - 42.4,hemoglobin12.1 - 13.4, platelets458,000 - 569,000, WBC10,500 - 15,700. Creatinine has been 1.0 - 1.2.  Bone marrowon 04/15/2002 revealed a normocellular marrow for age with trilineage hematopoiesis and mild megakaryocytic hyperplasia with focal atypia. There was no morphologic evidence of a myeloproliferative disorder. Iron stains were inadequate for evaluation of storage iron. There was no increase in reticulin fibers. She has been on agrylin since 2012.  Bone marrowon 09/25/2012 revealed a persistent myeloproliferative disorder s/p Agrylin. The current features were best classified as persistent myeloproliferative neoplasm (MPN) JAK2 V617F mutation positive. Marrow was hypercellular for age (30-50%) with increased trilineage hematopoiesis and markedly increased atypical megakaryopoiesis with frequent clustering. There were no increased blasts. Iron stores were absent. There was moderate to severe myelofibrosis(grade MF 2-3). Differential included ET, PV, primary myelofibrosis, and MPN, unclassifiable. Cytogeneticswere normal (34, XX).  She developed pancytopeniain 12/2015. Etiology is unclear (medication confusion or hydroxyurea + Agrylin).  She received Procrit40,000 units (intermittently 09/2012 - 06/2016), Venofer(09/2012 - 10/2012), and Infed(12/2015 - 03/2016). She received Neupogenduring a period of neutropenia. She was anagrelidefrom 2004 - 08/13/2017. She beganJakafion 08/14/2017. Jakafi was increased from 5 mg BID to 10 mg BID on03/18/2020.Jakafi was increased to 15 mg BID on 08/03/2018.She has been back on Jakafi 5 mg BID since 02/01/2019 and 10 mg BID on  05/25/2019.  She began a phlebotomy programon 08/06/2017. She undergoes small volume (150 cc with replacement of 150 cc NS)if her HCT >42.  Ferritinhas been followed: 529 on 09/03/2017, 1020 on 12/03/2017, 423 on 02/18/2018, and 858 on 11/17/2018.Iron saturationwas 37%and TIBC 270 on08/18/2020.B12 was 477 and folate 36 on 11/17/2018.  She was admitted to [facility] 12/17/2018 - 12/18/2018. She received 1 unit of PRBCs.CBC at discharge included a hematocrit of 29.0, hemoglobin 9.4, platelets 364,000, and WBC 10,200.  She received a course of Keflexfor a Proteus mirabilisUTIbeginning 01/11/2019.  She received her second dose of the COVID-19 vaccine three weeks ago.  Symptomatically, she feels great.  She denies any concerns.  She has  missed some of her Jakafi.  Plan: 1.   Review labs from 06/22/2019. 2.Polycythemia rubra vera Hematocrit36.8.Hemoglobin11.4.Platelets649,000. Platelet count goal < 400,000. Patient has been back onJakafi10 mg BIDsince 05/25/2019. She has missed 14 doses since last visit.  Continue current dose of Jakafi. Adjust Jakafi dose if platelet count dose not improve and she has not missed doses. 3.Renal insufficiency BUN35and creatinine 1.19. She is drinking fluids well. Continue to encourage fluid intake. 4.RTC monthly x 2 for labs (CBC with diff, CMP). 5.   RTC in 3 months for MD assessment and labs (CBC with diff, CMP, LDH).  I discussed the assessment and treatment plan with the patient.  The patient was provided an opportunity to ask questions and all were answered.  The patient agreed with the plan and demonstrated an understanding of the instructions.  The patient was advised to call back if the symptoms worsen or if the condition fails to improve as anticipated.  I provided 15 minutes (4:28 PM - 4:43 PM) of non-face-to-face time during this encounter.  I provided these services from the [facility] office.  An additional 5-7 minutes were spent reviewing her chart (Epic and Care Everywhere) including notes, labs, and imaging studies.    Lequita Asal, MD, PhD    06/23/2019, 11:41 PM  I, Jacqualyn Posey, am acting as a Education administrator for [employer] Wilcox. Mike Gip, MD.   I, Donevan Biller C. Mike Gip, MD, have reviewed the above documentation for accuracy and completeness, and I agree with the above.

## 2019-06-22 NOTE — Progress Notes (Signed)
Confirmed Name and DOB. Assessment completed with assistance from daughter [name]. Denies any concerns.

## 2019-06-23 ENCOUNTER — Inpatient Hospital Stay (HOSPITAL_BASED_OUTPATIENT_CLINIC_OR_DEPARTMENT_OTHER): Payer: Medicare Other | Admitting: Hematology and Oncology

## 2019-06-23 ENCOUNTER — Encounter: Payer: Self-pay | Admitting: Hematology and Oncology

## 2019-06-23 DIAGNOSIS — N289 Disorder of kidney and ureter, unspecified: Secondary | ICD-10-CM

## 2019-06-23 DIAGNOSIS — D45 Polycythemia vera: Secondary | ICD-10-CM

## 2019-07-16 DIAGNOSIS — R809 Proteinuria, unspecified: Secondary | ICD-10-CM | POA: Diagnosis not present

## 2019-07-16 DIAGNOSIS — N2581 Secondary hyperparathyroidism of renal origin: Secondary | ICD-10-CM | POA: Diagnosis not present

## 2019-07-16 DIAGNOSIS — N1832 Chronic kidney disease, stage 3b: Secondary | ICD-10-CM | POA: Diagnosis not present

## 2019-07-16 DIAGNOSIS — D631 Anemia in chronic kidney disease: Secondary | ICD-10-CM | POA: Diagnosis not present

## 2019-07-19 MED FILL — JAKAFI 10 MG TABLET: 10 | 30 days supply | Qty: 60 | Fill #1

## 2019-08-03 ENCOUNTER — Telehealth: Payer: Self-pay | Admitting: Pharmacy Technician

## 2019-08-03 NOTE — Telephone Encounter (Signed)
Oral Oncology Patient Advocate Encounter  Patients second grant through [facility] will end on 08/08/2019.  I renewed her grant online and was successful in securing patient an $8,500 grant from Patient [employer] [facility]) to provide copayment coverage for Jakafi.  This will keep the out of pocket expense at $0.     I have spoken with the patient.    The billing information is as follows and has been shared with Laura Wilcox.   Member ID: [id number] Group ID: [id number] RxBin: [id number] Dates of Eligibility: 08/09/2019 through 08/07/2020  Fund:  [place] Chromosome Negative Myeloproliferative [employer] Patient Laura Wilcox Phone [phone] Fax [phone] 08/03/2019 11:46 AM

## 2019-08-11 ENCOUNTER — Other Ambulatory Visit: Payer: Self-pay | Admitting: Hematology and Oncology

## 2019-08-11 DIAGNOSIS — D45 Polycythemia vera: Secondary | ICD-10-CM

## 2019-08-16 MED FILL — JAKAFI 10 MG TABLET: 10 | 30 days supply | Qty: 60 | Fill #0

## 2019-09-16 MED FILL — JAKAFI 10 MG TABLET: 10 | 30 days supply | Qty: 60 | Fill #1

## 2019-09-21 ENCOUNTER — Other Ambulatory Visit: Payer: Self-pay

## 2019-09-21 DIAGNOSIS — D45 Polycythemia vera: Secondary | ICD-10-CM

## 2019-09-22 ENCOUNTER — Other Ambulatory Visit: Payer: Self-pay

## 2019-09-22 ENCOUNTER — Encounter: Payer: Self-pay | Admitting: Hematology and Oncology

## 2019-09-22 NOTE — Progress Notes (Signed)
The patient daughter reports she has been having back pain ( 4) today which has limited her from going out for about 4 days. The patient name and DOB has been verified by phone today.

## 2019-09-22 NOTE — Progress Notes (Signed)
[facility]  [address] [place], [place] [address] Phone: [phone]  Fax: [phone]   Clinic Day:  09/23/2019  Referring physician: No ref. provider found  Chief Complaint: Laura Wilcox is a 86 y.o. female with polycythemia rubra vera (PV)on Jakafi who is seen for 3 month assessment.   HPI: The patient was last seen by video visit in the hematology clinic on 06/23/2019. At that time, she felt great. She denied any concerns. She had missed some of her Jakafi. She was encouraged to keep drinking fluids. Hematocrit was 36.8, hemoglobin 11.4, MCV 87.0, platelets 649,000, WBC 11,800 (ANC 6700).  Laura Wilcox was not increased secondary to missed doses.  The patient saw Dr. Holley Raring on 07/16/2019. Her chronic kidney disease appeared overall stable. She reported fatigue. Hematocrit was 34.7, hemoglobin 10.9, MCV 86.1, platelets 560,000, WBC 13,000 (Laura Wilcox [id number]).  Creatinine was 1.09. GFR was 45 ml/min. Will follow up in 4 months.  During the interim, she has been very tired. She is able to sleep through the night with no problems. She takes afternoon naps when she can and sometimes takes morning naps. She notes right sided back pain, reproducible with palpation. She denies any recent trama, injury, or fall. She has not been drinking a lot of fluids recently. Reports incontinence and hesistance about drinking fluids. The patient misses about one dose of Jakafi weekly. Her daughter provided much of her history.  The patient has received both doses of the COVID-19 vaccine.   Past Medical History:  Diagnosis Date  . Polycythemia     Past Surgical History:  Procedure Laterality Date  . ABDOMINAL HYSTERECTOMY     complete  . BREAST SURGERY      Family History  Problem Relation Age of Onset  . Heart disease Father   . Cancer Sister        breast  . Cancer Sister        breast  . Alzheimer's disease Sister     Social History:  reports that she quit  smoking about 41 years ago. She has a 30.00 pack-year smoking history. She has never used smokeless tobacco. She reports that she does not drink alcohol and does not use drugs. She is widowed. Patient is retired Sport and exercise psychologist. Patient moved to [facility] in 03/2017. She lives independently in her own apartment in [place]. Her daughter lives next door.Patient denies known exposures to radiation on toxins.Pt's daughter, Laura Wilcox be reached at [phone]. She lives in [place].  The patient is accompanied by her daughter today.   Allergies: No Known Allergies  Current Medications: Current Outpatient Medications  Medication Sig Dispense Refill  . calcitRIOL (ROCALTROL) 0.25 MCG capsule Take 0.25 mcg by mouth 3 (three) times a week.    Marland Kitchen JAKAFI 10 MG tablet TAKE 1 TABLET (10 MG TOTAL) BY MOUTH 2 TIMES DAILY. 60 tablet 1  . JAKAFI 5 MG tablet TAKE 1 TABLET (5 MG TOTAL) BY MOUTH 2 (TWO) TIMES DAILY. (Patient not taking: Reported on 06/22/2019) 60 tablet 0   No current facility-administered medications for this visit.   Review of Systems  Constitutional: Positive for malaise/fatigue (takes 1-2 naps daily). Negative for chills, diaphoresis and fever.  HENT: Positive for hearing loss. Negative for congestion, ear discharge, ear pain, nosebleeds, sinus pain, sore throat and tinnitus.   Eyes: Negative.  Negative for blurred vision.  Respiratory: Negative.  Negative for cough, hemoptysis, sputum production and shortness of breath.   Cardiovascular: Negative.  Negative for chest pain, palpitations  and leg swelling.  Gastrointestinal: Negative.  Negative for abdominal pain, blood in stool, constipation, diarrhea, heartburn, melena, nausea and vomiting.       Has not been drinking a lot of fluids.  Genitourinary: Negative for dysuria, frequency, hematuria and urgency.       Incontinence  Musculoskeletal: Positive for back pain (reproducable with palpation). Negative for falls.  Skin: Negative.  Negative for itching  and rash.  Neurological: Negative.  Negative for dizziness, tingling, sensory change, weakness and headaches.  Endo/Heme/Allergies: Negative.  Does not bruise/bleed easily.  Psychiatric/Behavioral: Positive for memory loss (improved). Negative for depression. The patient is not nervous/anxious and does not have insomnia.   All other systems reviewed and are negative.  Performance status (ECOG): 1  Vitals Blood pressure 139/66, pulse 77, temperature 97.6 F (36.4 C), temperature source Tympanic, weight 113 lb 1.5 oz (51.3 kg), SpO2 100 %.   Physical Exam Vitals and nursing note reviewed.  Constitutional:      General: She is not in acute distress.    Appearance: She is not diaphoretic.     Interventions: Face mask in place.  HENT:     Head: Normocephalic and atraumatic.     Comments: Short dark blonde hair.    Mouth/Throat:     Mouth: Mucous membranes are moist.     Pharynx: Oropharynx is clear.  Eyes:     General: No scleral icterus.    Extraocular Movements: Extraocular movements intact.     Conjunctiva/sclera: Conjunctivae normal.     Pupils: Pupils are equal, round, and reactive to light.     Comments: Blue eyes  Cardiovascular:     Rate and Rhythm: Normal rate and regular rhythm.     Pulses: Normal pulses.     Heart sounds: Normal heart sounds. No murmur heard.   Pulmonary:     Effort: Pulmonary effort is normal. No respiratory distress.     Breath sounds: Normal breath sounds. No wheezing or rales.  Chest:     Chest wall: No tenderness.  Abdominal:     General: Bowel sounds are normal. There is no distension.     Palpations: Abdomen is soft. There is no mass.     Tenderness: There is no abdominal tenderness. There is no guarding or rebound.  Musculoskeletal:        General: Tenderness (right back, tender with palpation. No percussion tenderness.) present. No swelling. Normal range of motion.     Cervical back: Normal range of motion and neck supple.  Skin:     General: Skin is warm and dry.  Neurological:     Mental Status: She is alert and oriented to person, place, and time. Mental status is at baseline.  Psychiatric:        Mood and Affect: Mood normal.        Behavior: Behavior normal.        Thought Content: Thought content normal.        Judgment: Judgment normal.    Appointment on 09/23/2019  Component Date Value Ref Range Status  . WBC 09/23/2019 9.7  4.0 - 10.5 K/uL Final  . RBC 09/23/2019 3.47* 3.87 - 5.11 MIL/uL Final  . Hemoglobin 09/23/2019 9.7* 12.0 - 15.0 g/dL Final  . HCT 09/23/2019 31.2* 36 - 46 % Final  . MCV 09/23/2019 89.9  80.0 - 100.0 fL Final  . MCH 09/23/2019 28.0  26.0 - 34.0 pg Final  . MCHC 09/23/2019 31.1  30.0 - 36.0 g/dL Final  .  RDW 09/23/2019 24.0* 11.5 - 15.5 % Final  . Platelets 09/23/2019 448* 150 - 400 K/uL Final  . nRBC 09/23/2019 0.0  0.0 - 0.2 % Final  . Neutrophils Relative % 09/23/2019 53  % Final  . Neutro Abs 09/23/2019 5.3  1.7 - 7.7 K/uL Final  . Lymphocytes Relative 09/23/2019 24  % Final  . Lymphs Abs 09/23/2019 2.3  0.7 - 4.0 K/uL Final  . Monocytes Relative 09/23/2019 10  % Final  . Monocytes Absolute 09/23/2019 1.0  0 - 1 K/uL Final  . Eosinophils Relative 09/23/2019 5  % Final  . Eosinophils Absolute 09/23/2019 0.5  0 - 0 K/uL Final  . Basophils Relative 09/23/2019 2  % Final  . Basophils Absolute 09/23/2019 0.2* 0 - 0 K/uL Final  . Immature Granulocytes 09/23/2019 6  % Final  . Abs Immature Granulocytes 09/23/2019 0.56* 0.00 - 0.07 K/uL Final   Performed at [facility], [address] [place], [place] [address]  . Sodium 09/23/2019 136  135 - 145 mmol/L Final  . Potassium 09/23/2019 4.0  3.5 - 5.1 mmol/L Final  . Chloride 09/23/2019 103  98 - 111 mmol/L Final  . CO2 09/23/2019 25  22 - 32 mmol/L Final  . Glucose, Bld 09/23/2019 137* 70 - 99 mg/dL Final   Glucose reference range applies only to samples taken after fasting for at least 8 hours.  . BUN 09/23/2019 28* 8  - 23 mg/dL Final  . Creatinine, Ser 09/23/2019 1.25* 0.44 - 1.00 mg/dL Final  . Calcium 09/23/2019 8.8* 8.9 - 10.3 mg/dL Final  . Total Protein 09/23/2019 6.8  6.5 - 8.1 g/dL Final  . Albumin 09/23/2019 4.0  3.5 - 5.0 g/dL Final  . AST 09/23/2019 22  15 - 41 U/L Final  . ALT 09/23/2019 15  0 - 44 U/L Final  . Alkaline Phosphatase 09/23/2019 67  38 - 126 U/L Final  . Total Bilirubin 09/23/2019 0.4  0.3 - 1.2 mg/dL Final  . GFR calc non Af Amer 09/23/2019 38* >60 mL/min Final  . GFR calc Af Amer 09/23/2019 44* >60 mL/min Final  . Anion gap 09/23/2019 8  5 - 15 Final   Performed at [facility], [address] [place], [place] [address]  . LDH 09/23/2019 195* 98 - 192 U/L Final   Performed at [facility], [address], [place], [place] [address]    Assessment:  Lachlan Pelto is a 86 y.o. female with polycythemia rubra veraand secondary myelofibrosis. She presented in 04/2002 with thrombocytosis (835,000). JAK2 V617Fwas positive on 05/20/2012.  Over the past year,hematocrithas ranged 37.5 - 42.4,hemoglobin12.1 - 13.4, platelets458,000 - 569,000, WBC10,500 - 15,700. Creatinine has been 1.0 - 1.2.  Bone marrowon 04/15/2002 revealed a normocellular marrow for age with trilineage hematopoiesis and mild megakaryocytic hyperplasia with focal atypia. There was no morphologic evidence of a myeloproliferative disorder. Iron stains were inadequate for evaluation of storage iron. There was no increase in reticulin fibers. She has been on agrylin since 2012.  Bone marrowon 09/25/2012 revealed a persistent myeloproliferative disorder s/p Agrylin. The current features were best classified as persistent myeloproliferative neoplasm (MPN) JAK2 V617F mutation positive. Marrow was hypercellular for age (30-50%) with increased trilineage hematopoiesis and markedly increased atypical megakaryopoiesis with frequent clustering. There were no increased blasts.  Iron stores were absent. There was moderate to severe myelofibrosis(grade MF 2-3). Differential included ET, PV, primary myelofibrosis, and MPN, unclassifiable. Cytogeneticswere normal (88, XX).  She developed  pancytopeniain 12/2015. Etiology is unclear (medication confusion or hydroxyurea + Agrylin).  She received Procrit40,000 units (intermittently 09/2012 - 06/2016), Venofer(09/2012 - 10/2012), and Infed(12/2015 - 03/2016). She received Neupogenduring a period of neutropenia. She was anagrelidefrom 2004 - 08/13/2017. She beganJakafion 08/14/2017. Jakafi was increased from 5 mg BID to 10 mg BID on03/18/2020.Jakafi was increased to 15 mg BID on 08/03/2018.She has been back on Jakafi 5 mg BID since 02/01/2019 and 10 mg BID on 05/25/2019.  She began a phlebotomy programon 08/06/2017. She undergoes small volume (150 cc with replacement of 150 cc NS)if her HCT >42.  Ferritinhas been followed: 529 on 09/03/2017, 1020 on 12/03/2017, 423 on 02/18/2018, and 858 on 11/17/2018.Iron saturationwas 37%and TIBC 270 on08/18/2020.B12 was 477 and folate 36 on 11/17/2018.  She was admitted to [facility] 12/17/2018 - 12/18/2018. She received 1 unit of PRBCs.CBC at discharge included a hematocrit of 29.0, hemoglobin 9.4, platelets 364,000, and WBC 10,200.  She received a course of Keflexfor a Proteus mirabilisUTIbeginning 01/11/2019.  The patient has received both doses of the COVID-19 vaccine.  Symptomatically, she has increased fatigue and is taking more naps.  She is eating well, but drinking poorly secondary to incontinence issues.  Exam reveals no adenopathy or hepatosplenomegaly.  She has focal right paraspinal muscular pain.  Plan: 1.   Labs today: CBC with diff, CMP, LDH. 2.Polycythemia rubra vera Hematocrit36.8.Hemoglobin11.4.Platelets649,000 on 06/22/2019.  Hematocrit32.2.Hemoglobin9.7.Platelets448,000 on 09/23/2019. Platelet count goal <  400,000. Patient has been back onJakafi10 mg BIDsince 05/25/2019. She has missed about 1 dose per week. Continue Jakafi 10 mg BID as platelet count improving and less missed doses. Monitor labs monthly secondary to drop in hemoglobin (similar to prior episode when Laura Wilcox needed to be held). 3.Renal insufficiency BUN28and creatinine 1.25. Encourage fluid intake.Marland Kitchen 4.Anemia  Etiology felt secondary to Laura Wilcox.  Anemia work-up today.  Check B12 next month (could not be added to today's labs). 5.   Muscular pain  Patient has a small area of paraspinal muscular pain.  No trauma.  Discuss symptom management. 6.   RTC monthly x 2 for labs (CBC with diff, +/- others). 7.   RTC in 3 months for MD assess and labs (CBC with diff, CMP).  I discussed the assessment and treatment plan with the patient.  The patient was provided an opportunity to ask questions and all were answered.  The patient agreed with the plan and demonstrated an understanding of the instructions.  The patient was advised to call back if the symptoms worsen or if the condition fails to improve as anticipated.   Lequita Asal, MD, PhD    09/23/2019, 11:41 PM  I, Mirian Mo Tufford, am acting as a Education administrator for [employer]. Mike Gip, MD.   I, Tinika Bucknam C. Mike Gip, MD, have reviewed the above documentation for accuracy and completeness, and I agree with the above.

## 2019-09-23 ENCOUNTER — Other Ambulatory Visit: Payer: Self-pay

## 2019-09-23 ENCOUNTER — Inpatient Hospital Stay: Payer: Medicare Other | Attending: Hematology and Oncology | Admitting: Hematology and Oncology

## 2019-09-23 ENCOUNTER — Inpatient Hospital Stay: Payer: Medicare Other

## 2019-09-23 ENCOUNTER — Encounter: Payer: Self-pay | Admitting: Hematology and Oncology

## 2019-09-23 VITALS — BP 139/66 | HR 77 | Temp 97.6°F | Wt 113.1 lb

## 2019-09-23 DIAGNOSIS — Z79899 Other long term (current) drug therapy: Secondary | ICD-10-CM | POA: Diagnosis not present

## 2019-09-23 DIAGNOSIS — D45 Polycythemia vera: Secondary | ICD-10-CM

## 2019-09-23 DIAGNOSIS — Z87891 Personal history of nicotine dependence: Secondary | ICD-10-CM | POA: Insufficient documentation

## 2019-09-23 DIAGNOSIS — Z8249 Family history of ischemic heart disease and other diseases of the circulatory system: Secondary | ICD-10-CM | POA: Insufficient documentation

## 2019-09-23 DIAGNOSIS — D649 Anemia, unspecified: Secondary | ICD-10-CM

## 2019-09-23 DIAGNOSIS — M549 Dorsalgia, unspecified: Secondary | ICD-10-CM | POA: Insufficient documentation

## 2019-09-23 DIAGNOSIS — Z803 Family history of malignant neoplasm of breast: Secondary | ICD-10-CM | POA: Diagnosis not present

## 2019-09-23 DIAGNOSIS — R5383 Other fatigue: Secondary | ICD-10-CM | POA: Insufficient documentation

## 2019-09-23 DIAGNOSIS — N289 Disorder of kidney and ureter, unspecified: Secondary | ICD-10-CM

## 2019-09-23 DIAGNOSIS — D7581 Myelofibrosis: Secondary | ICD-10-CM | POA: Diagnosis not present

## 2019-09-23 DIAGNOSIS — Z818 Family history of other mental and behavioral disorders: Secondary | ICD-10-CM | POA: Insufficient documentation

## 2019-09-23 DIAGNOSIS — N189 Chronic kidney disease, unspecified: Secondary | ICD-10-CM | POA: Diagnosis not present

## 2019-09-23 DIAGNOSIS — R32 Unspecified urinary incontinence: Secondary | ICD-10-CM | POA: Diagnosis not present

## 2019-09-23 DIAGNOSIS — M791 Myalgia, unspecified site: Secondary | ICD-10-CM | POA: Diagnosis not present

## 2019-09-23 LAB — CBC WITH DIFFERENTIAL/PLATELET
Abs Immature Granulocytes: 0.56 10*3/uL — ABNORMAL HIGH (ref 0.00–0.07)
Basophils Absolute: 0.2 10*3/uL — ABNORMAL HIGH (ref 0.0–0.1)
Basophils Relative: 2 %
Eosinophils Absolute: 0.5 10*3/uL (ref 0.0–0.5)
Eosinophils Relative: 5 %
HCT: 31.2 % — ABNORMAL LOW (ref 36.0–46.0)
Hemoglobin: 9.7 g/dL — ABNORMAL LOW (ref 12.0–15.0)
Immature Granulocytes: 6 %
Lymphocytes Relative: 24 %
Lymphs Abs: 2.3 10*3/uL (ref 0.7–4.0)
MCH: 28 pg (ref 26.0–34.0)
MCHC: 31.1 g/dL (ref 30.0–36.0)
MCV: 89.9 fL (ref 80.0–100.0)
Monocytes Absolute: 1 10*3/uL (ref 0.1–1.0)
Monocytes Relative: 10 %
Neutro Abs: 5.3 10*3/uL (ref 1.7–7.7)
Neutrophils Relative %: 53 %
Platelets: 448 10*3/uL — ABNORMAL HIGH (ref 150–400)
RBC: 3.47 MIL/uL — ABNORMAL LOW (ref 3.87–5.11)
RDW: 24 % — ABNORMAL HIGH (ref 11.5–15.5)
WBC: 9.7 10*3/uL (ref 4.0–10.5)
nRBC: 0 % (ref 0.0–0.2)

## 2019-09-23 LAB — COMPREHENSIVE METABOLIC PANEL
ALT: 15 U/L (ref 0–44)
AST: 22 U/L (ref 15–41)
Albumin: 4 g/dL (ref 3.5–5.0)
Alkaline Phosphatase: 67 U/L (ref 38–126)
Anion gap: 8 (ref 5–15)
BUN: 28 mg/dL — ABNORMAL HIGH (ref 8–23)
CO2: 25 mmol/L (ref 22–32)
Calcium: 8.8 mg/dL — ABNORMAL LOW (ref 8.9–10.3)
Chloride: 103 mmol/L (ref 98–111)
Creatinine, Ser: 1.25 mg/dL — ABNORMAL HIGH (ref 0.44–1.00)
GFR calc Af Amer: 44 mL/min — ABNORMAL LOW (ref >60)
GFR calc non Af Amer: 38 mL/min — ABNORMAL LOW (ref >60)
Glucose, Bld: 137 mg/dL — ABNORMAL HIGH (ref 70–99)
Potassium: 4 mmol/L (ref 3.5–5.1)
Sodium: 136 mmol/L (ref 135–145)
Total Bilirubin: 0.4 mg/dL (ref 0.3–1.2)
Total Protein: 6.8 g/dL (ref 6.5–8.1)

## 2019-09-23 LAB — IRON AND TIBC
Iron: 155 ug/dL (ref 28–170)
Saturation Ratios: 56 % — ABNORMAL HIGH (ref 10.4–31.8)
TIBC: 277 ug/dL (ref 250–450)
UIBC: 122 ug/dL

## 2019-09-23 LAB — RETICULOCYTES
Immature Retic Fract: 20.3 % — ABNORMAL HIGH (ref 2.3–15.9)
RBC.: 3.45 MIL/uL — ABNORMAL LOW (ref 3.87–5.11)
Retic Count, Absolute: 63.5 10*3/uL (ref 19.0–186.0)
Retic Ct Pct: 1.8 % (ref 0.4–3.1)

## 2019-09-23 LAB — FOLATE: Folate: 40 ng/mL (ref >5.9)

## 2019-09-23 LAB — FERRITIN: Ferritin: 785 ng/mL — ABNORMAL HIGH (ref 11–307)

## 2019-09-23 LAB — TSH: TSH: 2.251 u[IU]/mL (ref 0.350–4.500)

## 2019-09-23 LAB — LACTATE DEHYDROGENASE: LDH: 195 U/L — ABNORMAL HIGH (ref 98–192)

## 2019-10-20 ENCOUNTER — Other Ambulatory Visit: Payer: Self-pay | Admitting: Hematology and Oncology

## 2019-10-20 DIAGNOSIS — D45 Polycythemia vera: Secondary | ICD-10-CM

## 2019-10-20 NOTE — Telephone Encounter (Signed)
CBC with Differential/Platelet Order: [id number] Status:  Final result Visible to patient:  Yes (not seen) Next appt:  10/26/2019 at 10:45 AM in Oncology ([facility]) Dx:  Polycythemia rubra vera ([place])  0 Result Notes  Ref Range & Units 3 wk ago  (09/23/19) 4 mo ago  (06/22/19) 4 mo ago  (05/25/19) 5 mo ago  (04/27/19)  WBC 4.0 - 10.5 K/uL 9.7  11.8High  14.2High  12.1High   RBC 3.87 - 5.11 MIL/uL 3.47Low  4.23  4.47  4.29   Hemoglobin 12.0 - 15.0 g/dL 9.7Low  11.4Low  12.2  11.8Low   HCT 36 - 46 % 31.2Low  36.8  39.0  38.1   MCV 80.0 - 100.0 fL 89.9  87.0  87.2  88.8   MCH 26.0 - 34.0 pg 28.0  27.0  27.3  27.5   MCHC 30.0 - 36.0 g/dL 31.1  31.0  31.3  31.0   RDW 11.5 - 15.5 % 24.0High  22.6High  21.8High  21.4High   Platelets 150 - 400 K/uL 448High  649High  629High  618High   nRBC 0.0 - 0.2 % 0.0  0.0  0.0  0.0   Neutrophils Relative % % 53  56  67  65   Neutro Abs 1.7 - 7.7 K/uL 5.3  6.7  9.6High  8.0High   Lymphocytes Relative % 24  21  13  16    Lymphs Abs 0.7 - 4.0 K/uL 2.3  2.5  1.8  1.9   Monocytes Relative % 10  10  9  9    Monocytes Absolute 0 - 1 K/uL 1.0  1.2High  1.3High  1.1High   Eosinophils Relative % 5  5  5  6    Eosinophils Absolute 0 - 0 K/uL 0.5  0.6High  0.7High  0.7High   Basophils Relative % 2  2  2  2    Basophils Absolute 0 - 0 K/uL 0.2High  0.2High  0.2High  0.2High   Immature Granulocytes % 6  6  4  2    Abs Immature Granulocytes 0.00 - 0.07 K/uL 0.56High  0.73High CM  0.54High CM  0.24High CM   Comment: Performed at [facility], [address] [place], [place] [address]  Resulting Agency  [facility] CLIN LAB [facility] CLIN LAB [facility] CLIN LAB [facility] LAB      Specimen Collected: 09/23/19 10:13 Last Resulted: 09/23/19 10:44     Lab Flowsheet   Order Details   View Encounter   Lab and Collection Details   Routing   Result History     CM=Additional comments    Result Care  Coordination  Patient Communication  Add Comments Add Notifications Back to Top      Other Results from 09/23/2019  Contains abnormal dataComprehensive metabolic panel  Status:  Final result Visible to patient:  Yes (not seen) Next appt:  10/26/2019 at 10:45 AM in Oncology ([facility]) Dx:  Polycythemia rubra vera ([place]) Order: [id number]  0 Result Notes  Ref Range & Units 3 wk ago  (09/23/19) 4 mo ago  (06/22/19) 4 mo ago  (05/25/19) 5 mo ago  (04/27/19)  Sodium 135 - 145 mmol/L 136  133Low  132Low  137   Potassium 3.5 - 5.1 mmol/L 4.0  4.7  4.4  4.1   Chloride 98 - 111 mmol/L 103  99  97Low  101   CO2 22 - 32 mmol/L 25  24  26  27    Glucose, Bld 70 -  99 mg/dL 137High  78 CM  81 CM  91   Comment: Glucose reference range applies only to samples taken after fasting for at least 8 hours.  BUN 8 - 23 mg/dL 28High  35High  27High  26High   Creatinine, Ser 0.44 - 1.00 mg/dL 1.25High  1.19High  1.07High  1.12High   Calcium 8.9 - 10.3 mg/dL 8.8Low  9.2  9.4  9.3   Total Protein 6.5 - 8.1 g/dL 6.8  6.9  7.6    Albumin 3.5 - 5.0 g/dL 4.0  4.1  4.2    AST 15 - 41 U/L 22  20  19     ALT 0 - 44 U/L 15  13  13     Alkaline Phosphatase 38 - 126 U/L 67  83  90    Total Bilirubin 0.3 - 1.2 mg/dL 0.4  0.5  0.5    GFR calc non Af Amer >60 mL/min 38Low  40Low  46Low  43Low   GFR calc Af Amer >60 mL/min 44Low  47Low  53Low  50Low   Anion gap 5 - 15 8  10  CM  9 CM  9 CM   Comment: Performed at [facility], [address] [place], [place] [address]  Resulting Agency  [facility] CLIN LAB [facility] CLIN LAB [facility] CLIN LAB [facility] CLIN LAB      Specimen Collected: 09/23/19 10:13 Last Resulted: 09/23/19 10:36

## 2019-10-21 MED FILL — JAKAFI 10 MG TABLET: 10 | 30 days supply | Qty: 60 | Fill #0

## 2019-10-26 ENCOUNTER — Other Ambulatory Visit: Payer: Self-pay

## 2019-10-26 ENCOUNTER — Ambulatory Visit
Admission: EM | Admit: 2019-10-26 | Discharge: 2019-10-26 | Disposition: A | Discharge status: Home / Self Care | Payer: Medicare Other | Attending: Family Medicine | Admitting: Family Medicine

## 2019-10-26 ENCOUNTER — Inpatient Hospital Stay: Payer: Medicare Other | Attending: Hematology and Oncology

## 2019-10-26 ENCOUNTER — Telehealth: Payer: Self-pay

## 2019-10-26 ENCOUNTER — Encounter: Payer: Self-pay | Admitting: Emergency Medicine

## 2019-10-26 DIAGNOSIS — D45 Polycythemia vera: Secondary | ICD-10-CM | POA: Insufficient documentation

## 2019-10-26 DIAGNOSIS — D649 Anemia, unspecified: Secondary | ICD-10-CM | POA: Insufficient documentation

## 2019-10-26 DIAGNOSIS — M546 Pain in thoracic spine: Secondary | ICD-10-CM | POA: Insufficient documentation

## 2019-10-26 LAB — CBC WITH DIFFERENTIAL/PLATELET
Abs Immature Granulocytes: 0.5 10*3/uL — ABNORMAL HIGH (ref 0.00–0.07)
Basophils Absolute: 0.1 10*3/uL (ref 0.0–0.1)
Basophils Relative: 1 %
Eosinophils Absolute: 0.3 10*3/uL (ref 0.0–0.5)
Eosinophils Relative: 3 %
HCT: 31 % — ABNORMAL LOW (ref 36.0–46.0)
Hemoglobin: 9.6 g/dL — ABNORMAL LOW (ref 12.0–15.0)
Immature Granulocytes: 5 %
Lymphocytes Relative: 20 %
Lymphs Abs: 2 10*3/uL (ref 0.7–4.0)
MCH: 29 pg (ref 26.0–34.0)
MCHC: 31 g/dL (ref 30.0–36.0)
MCV: 93.7 fL (ref 80.0–100.0)
Monocytes Absolute: 1.1 10*3/uL — ABNORMAL HIGH (ref 0.1–1.0)
Monocytes Relative: 11 %
Neutro Abs: 6.1 10*3/uL (ref 1.7–7.7)
Neutrophils Relative %: 60 %
Platelets: 466 10*3/uL — ABNORMAL HIGH (ref 150–400)
RBC: 3.31 MIL/uL — ABNORMAL LOW (ref 3.87–5.11)
RDW: 23.9 % — ABNORMAL HIGH (ref 11.5–15.5)
WBC: 10.1 10*3/uL (ref 4.0–10.5)
nRBC: 0.3 % — ABNORMAL HIGH (ref 0.0–0.2)

## 2019-10-26 LAB — URINALYSIS, COMPLETE (UACMP) WITH MICROSCOPIC
Bilirubin Urine: NEGATIVE
Glucose, UA: NEGATIVE mg/dL
Hgb urine dipstick: NEGATIVE
Ketones, ur: NEGATIVE mg/dL
Nitrite: NEGATIVE
Protein, ur: NEGATIVE mg/dL
Specific Gravity, Urine: 1.015 (ref 1.005–1.030)
pH: 6.5 (ref 5.0–8.0)

## 2019-10-26 LAB — VITAMIN B12: Vitamin B-12: 420 pg/mL (ref 180–914)

## 2019-10-26 NOTE — ED Provider Notes (Signed)
[facility]    CSN: [id number] Arrival date & time: 10/26/19  1213      History   Chief Complaint Chief Complaint  Patient presents with  . Back Pain  . Fatigue   HPI  86 year old female presents with back pain.  Daughter reports that she has been complaining of back pain intermittently for the past 2 weeks.  She has had some chills.  Fatigue.  Daughter reports that she has had a prior UTI which presented similarly.  Concerned about UTI.  Patient reported back pain to the nurse but is currently not complaining of back pain to me.  No fever.  Denies urinary symptoms.  No other complaints at this time.  Past Medical History:  Diagnosis Date  . Polycythemia    Patient Active Problem List   Diagnosis Date Noted  . Healthcare maintenance 04/11/2019  . Confusion 12/30/2018  . Hypotension 12/30/2018  . Dysuria 12/30/2018  . Generalized weakness 12/17/2018  . Hyperparathyroidism ([place]) 02/19/2018  . Symptomatic anemia 02/18/2018  . Decreased renal function 02/18/2018  . Renal insufficiency 02/18/2018  . Elevated ferritin 02/18/2018  . Goals of care, counseling/discussion 07/09/2017  . Polycythemia rubra vera ([place]) 06/02/2017  . Underweight 06/02/2017  . Urticaria 06/02/2017  . Advanced care planning/counseling discussion 06/02/2017  . Mild cognitive impairment 06/02/2017    Past Surgical History:  Procedure Laterality Date  . ABDOMINAL HYSTERECTOMY     complete  . BREAST SURGERY      OB History   No obstetric history on file.      Home Medications    Prior to Admission medications   Medication Sig Start Date End Date Taking? Authorizing Provider  calcitRIOL (ROCALTROL) 0.25 MCG capsule Take 0.25 mcg by mouth 3 (three) times a week.   Yes [provider]  JAKAFI 10 MG tablet TAKE 1 TABLET (10 MG TOTAL) BY MOUTH 2 TIMES DAILY. 10/20/19  Yes Corcoran, Melissa C, MD  JAKAFI 5 MG tablet TAKE 1 TABLET (5 MG TOTAL) BY MOUTH 2 (TWO) TIMES  DAILY. Patient not taking: Reported on 06/22/2019 04/20/19   Lequita Asal, MD    Family History Family History  Problem Relation Age of Onset  . Heart disease Father   . Cancer Sister        breast  . Cancer Sister        breast  . Alzheimer's disease Sister     Social History Social History   Tobacco Use  . Smoking status: Former Smoker    Packs/day: 1.00    Years: 30.00    Pack years: 30.00    Quit date: 04/02/1978    Years since quitting: 41.5  . Smokeless tobacco: Never Used  Vaping Use  . Vaping Use: Never used  Substance Use Topics  . Alcohol use: No    Comment: occasional glass of wine  . Drug use: No     Allergies   Patient has no known allergies.   Review of Systems Review of Systems  Genitourinary: Negative.   Musculoskeletal: Positive for back pain.   Physical Exam Triage Vital Signs ED Triage Vitals  Enc Vitals Group     BP 10/26/19 1252 116/77     Pulse Rate 10/26/19 1252 78     Resp 10/26/19 1252 18     Temp 10/26/19 1252 97.8 F (36.6 C)     Temp Source 10/26/19 1252 Oral     SpO2 10/26/19 1252 98 %  Weight 10/26/19 1251 113 lb 1.5 oz (51.3 kg)     Height 10/26/19 1251 5\' 7"  (1.702 m)     Head Circumference --      Peak Flow --      Pain Score 10/26/19 1250 4     Pain Loc --      Pain Edu? --      Excl. in [place]? --    Updated Vital Signs BP 116/77 (BP Location: Right Arm)   Pulse 78   Temp 97.8 F (36.6 C) (Oral)   Resp 18   Ht 5\' 7"  (1.702 m)   Wt 51.3 kg   SpO2 98%   BMI 17.71 kg/m   Visual Acuity Right Eye Distance:   Left Eye Distance:   Bilateral Distance:    Right Eye Near:   Left Eye Near:    Bilateral Near:     Physical Exam Vitals and nursing note reviewed.  Constitutional:      General: She is not in acute distress.    Appearance: Normal appearance. She is not ill-appearing.  HENT:     Head: Normocephalic and atraumatic.  Eyes:     General:        Right eye: No discharge.        Left eye: No  discharge.     Conjunctiva/sclera: Conjunctivae normal.  Pulmonary:     Effort: Pulmonary effort is normal. No respiratory distress.  Musculoskeletal:     Comments: Thoracic spine with no significant tenderness to palpation.  Kyphosis noted.  Neurological:     Mental Status: She is alert.  Psychiatric:        Mood and Affect: Mood normal.        Behavior: Behavior normal.    UC Treatments / Results  Labs (all labs ordered are listed, but only abnormal results are displayed) Labs Reviewed  URINALYSIS, COMPLETE (UACMP) WITH MICROSCOPIC - Abnormal; Notable for the following components:      Result Value   Leukocytes,Ua TRACE (*)    Bacteria, UA RARE (*)    All other components within normal limits  URINE CULTURE    EKG   Radiology No results found.  Procedures Procedures (including critical care time)  Medications Ordered in UC Medications - No data to display  Initial Impression / Assessment and Plan / UC Course  I have reviewed the triage vital signs and the nursing notes.  Pertinent labs & imaging results that were available during my care of the patient were reviewed by me and considered in my medical decision making (see chart for details).    86 year old female presents with back pain.  Urinalysis with trace leukocytes.  Clinically do not suspect UTI.  Sending culture.  Advised Tylenol and supportive care.  Final Clinical Impressions(s) / UC Diagnoses   Final diagnoses:  Acute thoracic back pain, unspecified back pain laterality     Discharge Instructions     Tylenol as needed.  Follow up with your physician if persists.  Take care  Dr. Lacinda Axon    ED Prescriptions    None     PDMP not reviewed this encounter.   Coral Spikes, [place] 10/26/19 1423

## 2019-10-26 NOTE — ED Triage Notes (Addendum)
Patient was at the cancer center for routine blood work and was told to come to UC to be evaluated. She gave a specimen at the cancer center and was told by the doctor that she was only checking blood work and not urine. Patient c/o back pain x 2 weeks and fatigue.

## 2019-10-26 NOTE — Telephone Encounter (Signed)
Spoke with the Laura Wilcox to inform her, Per Dr Mike Gip her platelets are a little higher thatn 400,000 and at this time continue current dose of Jakafi , Dr Mike Gip don't want to increase at this time because it might drop her Hbg. repeat labs in 1 month. Laura Wilcox was understanding and agreeable.

## 2019-10-26 NOTE — Discharge Instructions (Signed)
Tylenol as needed.  Follow up with your physician if persists.  Take care  Dr. Lacinda Axon

## 2019-10-29 ENCOUNTER — Telehealth (HOSPITAL_COMMUNITY): Payer: Self-pay

## 2019-10-29 LAB — URINE CULTURE: Culture: 30000 — AB

## 2019-10-29 MED ORDER — CEPHALEXIN 500 MG PO CAPS
500.0000 mg | ORAL_CAPSULE | Freq: Two times a day (BID) | ORAL | 0 refills | Status: AC
Start: 2019-10-29 — End: 2019-11-05

## 2019-11-18 MED FILL — JAKAFI 10 MG TABLET: 10 | 30 days supply | Qty: 60 | Fill #1

## 2019-11-25 DIAGNOSIS — N1832 Chronic kidney disease, stage 3b: Secondary | ICD-10-CM | POA: Diagnosis not present

## 2019-11-25 DIAGNOSIS — N390 Urinary tract infection, site not specified: Secondary | ICD-10-CM | POA: Diagnosis not present

## 2019-11-29 ENCOUNTER — Inpatient Hospital Stay: Payer: Medicare Other | Attending: Hematology and Oncology

## 2019-11-29 ENCOUNTER — Other Ambulatory Visit: Payer: Self-pay

## 2019-11-29 DIAGNOSIS — D45 Polycythemia vera: Secondary | ICD-10-CM

## 2019-11-29 LAB — CBC WITH DIFFERENTIAL/PLATELET
Abs Immature Granulocytes: 0.68 10*3/uL — ABNORMAL HIGH (ref 0.00–0.07)
Basophils Absolute: 0.2 10*3/uL — ABNORMAL HIGH (ref 0.0–0.1)
Basophils Relative: 2 %
Eosinophils Absolute: 0.4 10*3/uL (ref 0.0–0.5)
Eosinophils Relative: 4 %
HCT: 29.6 % — ABNORMAL LOW (ref 36.0–46.0)
Hemoglobin: 9.2 g/dL — ABNORMAL LOW (ref 12.0–15.0)
Immature Granulocytes: 7 %
Lymphocytes Relative: 10 %
Lymphs Abs: 1.1 10*3/uL (ref 0.7–4.0)
MCH: 29.3 pg (ref 26.0–34.0)
MCHC: 31.1 g/dL (ref 30.0–36.0)
MCV: 94.3 fL (ref 80.0–100.0)
Monocytes Absolute: 1.3 10*3/uL — ABNORMAL HIGH (ref 0.1–1.0)
Monocytes Relative: 12 %
Neutro Abs: 6.8 10*3/uL (ref 1.7–7.7)
Neutrophils Relative %: 65 %
Platelets: 521 10*3/uL — ABNORMAL HIGH (ref 150–400)
RBC: 3.14 MIL/uL — ABNORMAL LOW (ref 3.87–5.11)
RDW: 23.1 % — ABNORMAL HIGH (ref 11.5–15.5)
WBC: 10.4 10*3/uL (ref 4.0–10.5)
nRBC: 0.4 % — ABNORMAL HIGH (ref 0.0–0.2)

## 2019-12-03 ENCOUNTER — Other Ambulatory Visit: Payer: Self-pay

## 2019-12-03 ENCOUNTER — Ambulatory Visit
Admission: RE | Admit: 2019-12-03 | Discharge: 2019-12-03 | Disposition: A | Discharge status: Home / Self Care | Payer: Medicare Other | Source: Ambulatory Visit | Attending: Nephrology | Admitting: Nephrology

## 2019-12-03 DIAGNOSIS — N2581 Secondary hyperparathyroidism of renal origin: Secondary | ICD-10-CM | POA: Diagnosis not present

## 2019-12-03 DIAGNOSIS — N1832 Chronic kidney disease, stage 3b: Secondary | ICD-10-CM | POA: Diagnosis not present

## 2019-12-03 DIAGNOSIS — R809 Proteinuria, unspecified: Secondary | ICD-10-CM | POA: Diagnosis not present

## 2019-12-03 DIAGNOSIS — E860 Dehydration: Secondary | ICD-10-CM | POA: Insufficient documentation

## 2019-12-03 DIAGNOSIS — D631 Anemia in chronic kidney disease: Secondary | ICD-10-CM | POA: Diagnosis not present

## 2019-12-03 MED ORDER — SODIUM CHLORIDE 0.9 % IV SOLN: INTRAVENOUS | Status: AC

## 2019-12-08 DIAGNOSIS — N2581 Secondary hyperparathyroidism of renal origin: Secondary | ICD-10-CM | POA: Diagnosis not present

## 2019-12-08 DIAGNOSIS — D631 Anemia in chronic kidney disease: Secondary | ICD-10-CM | POA: Diagnosis not present

## 2019-12-08 DIAGNOSIS — N1832 Chronic kidney disease, stage 3b: Secondary | ICD-10-CM | POA: Diagnosis not present

## 2019-12-08 DIAGNOSIS — R809 Proteinuria, unspecified: Secondary | ICD-10-CM | POA: Diagnosis not present

## 2019-12-13 ENCOUNTER — Telehealth: Payer: Self-pay | Admitting: *Deleted

## 2019-12-13 NOTE — Telephone Encounter (Signed)
  Please schedule for fluids NS 100 cc/hr x 5 hours.  M

## 2019-12-13 NOTE — Telephone Encounter (Signed)
Dr. Holley Raring would like patient to be set up to receive fluids. NS @100cc  and hour for 5 hours. Anderson Malta is the contact person [phone]

## 2019-12-14 ENCOUNTER — Other Ambulatory Visit: Payer: Self-pay

## 2019-12-14 ENCOUNTER — Inpatient Hospital Stay: Payer: Medicare Other | Attending: Hematology and Oncology

## 2019-12-14 VITALS — BP 84/57 | HR 81 | Resp 18

## 2019-12-14 DIAGNOSIS — R5383 Other fatigue: Secondary | ICD-10-CM | POA: Insufficient documentation

## 2019-12-14 DIAGNOSIS — Z8249 Family history of ischemic heart disease and other diseases of the circulatory system: Secondary | ICD-10-CM | POA: Insufficient documentation

## 2019-12-14 DIAGNOSIS — Z803 Family history of malignant neoplasm of breast: Secondary | ICD-10-CM | POA: Insufficient documentation

## 2019-12-14 DIAGNOSIS — Z79899 Other long term (current) drug therapy: Secondary | ICD-10-CM | POA: Diagnosis not present

## 2019-12-14 DIAGNOSIS — R32 Unspecified urinary incontinence: Secondary | ICD-10-CM | POA: Insufficient documentation

## 2019-12-14 DIAGNOSIS — F0390 Unspecified dementia without behavioral disturbance: Secondary | ICD-10-CM | POA: Insufficient documentation

## 2019-12-14 DIAGNOSIS — D631 Anemia in chronic kidney disease: Secondary | ICD-10-CM | POA: Diagnosis not present

## 2019-12-14 DIAGNOSIS — R413 Other amnesia: Secondary | ICD-10-CM | POA: Insufficient documentation

## 2019-12-14 DIAGNOSIS — D709 Neutropenia, unspecified: Secondary | ICD-10-CM | POA: Diagnosis not present

## 2019-12-14 DIAGNOSIS — E860 Dehydration: Secondary | ICD-10-CM | POA: Insufficient documentation

## 2019-12-14 DIAGNOSIS — D45 Polycythemia vera: Secondary | ICD-10-CM | POA: Insufficient documentation

## 2019-12-14 DIAGNOSIS — Z87891 Personal history of nicotine dependence: Secondary | ICD-10-CM | POA: Insufficient documentation

## 2019-12-14 DIAGNOSIS — R42 Dizziness and giddiness: Secondary | ICD-10-CM | POA: Diagnosis not present

## 2019-12-14 DIAGNOSIS — N1832 Chronic kidney disease, stage 3b: Secondary | ICD-10-CM | POA: Diagnosis not present

## 2019-12-14 DIAGNOSIS — Z818 Family history of other mental and behavioral disorders: Secondary | ICD-10-CM | POA: Diagnosis not present

## 2019-12-14 DIAGNOSIS — D7581 Myelofibrosis: Secondary | ICD-10-CM | POA: Insufficient documentation

## 2019-12-14 DIAGNOSIS — H9190 Unspecified hearing loss, unspecified ear: Secondary | ICD-10-CM | POA: Diagnosis not present

## 2019-12-14 DIAGNOSIS — N390 Urinary tract infection, site not specified: Secondary | ICD-10-CM | POA: Diagnosis not present

## 2019-12-14 DIAGNOSIS — M7918 Myalgia, other site: Secondary | ICD-10-CM | POA: Insufficient documentation

## 2019-12-14 DIAGNOSIS — N289 Disorder of kidney and ureter, unspecified: Secondary | ICD-10-CM

## 2019-12-14 MED ORDER — SODIUM CHLORIDE 0.9 % IV SOLN
Freq: Once | INTRAVENOUS | Status: AC
  Filled 2019-12-14: qty 250

## 2019-12-15 ENCOUNTER — Other Ambulatory Visit: Payer: Self-pay | Admitting: Hematology and Oncology

## 2019-12-15 DIAGNOSIS — D45 Polycythemia vera: Secondary | ICD-10-CM

## 2019-12-15 NOTE — Telephone Encounter (Signed)
CBC with Differential/Platelet Order: [id number] Status:  Final result Visible to patient:  Yes (seen) Next appt:  12/27/2019 at 01:00 PM in Oncology ([facility]) Dx:  Polycythemia rubra vera ([place])  0 Result Notes  Ref Range & Units 2 wk ago 1 mo ago  WBC 4.0 - 10.5 K/uL 10.4  10.1   RBC 3.87 - 5.11 MIL/uL 3.14Low  3.31Low   Hemoglobin 12.0 - 15.0 g/dL 9.2Low  9.6Low   HCT 36 - 46 % 29.6Low  31.0Low   MCV 80.0 - 100.0 fL 94.3  93.7   MCH 26.0 - 34.0 pg 29.3  29.0   MCHC 30.0 - 36.0 g/dL 31.1  31.0   RDW 11.5 - 15.5 % 23.1High  23.9High   Platelets 150 - 400 K/uL 521High  466High   nRBC 0.0 - 0.2 % 0.4High  0.3High   Neutrophils Relative % % 65  60   Neutro Abs 1.7 - 7.7 K/uL 6.8  6.1   Lymphocytes Relative % 10  20   Lymphs Abs 0.7 - 4.0 K/uL 1.1  2.0   Monocytes Relative % 12  11   Monocytes Absolute 0 - 1 K/uL 1.3High  1.1High   Eosinophils Relative % 4  3   Eosinophils Absolute 0 - 0 K/uL 0.4  0.3   Basophils Relative % 2  1   Basophils Absolute 0 - 0 K/uL 0.2High  0.1   Immature Granulocytes % 7  5   Abs Immature Granulocytes 0.00 - 0.07 K/uL 0.68High  0.50High CM   Comment: Performed at [facility], [address], [place],  [address]  Resulting Agency  [facility] CLIN LAB [facility]      Specimen Collected: 11/29/19 10:59 Last Resulted: 11/29/19 12:04

## 2019-12-19 NOTE — Progress Notes (Signed)
[facility]  [address], [address] [place], [place] [address] Phone: [phone]  Fax: [phone]   Clinic Day:  12/20/2019   Referring physician: Lequita Asal, MD  Chief Complaint: Laura Wilcox is a 86 y.o. female with polycythemia rubra vera (PV)on [name] who is seen for 3 month assessment.   HPI: The patient was last seen in the hematology clinic on 09/23/2019. At that time, she had increased fatigue and was taking more naps.  She was eating well, but drinking poorly secondary to incontinence issues.  Exam revealed no adenopathy or hepatosplenomegaly.  She had focal right paraspinal muscular pain.  She saw Dr. Holley Raring for follow-up of stage IIIb CKD on 12/03/2019. GFR was stable at 45 ml/min.  She has underlying proteinuria with an albumen creatinine ratio of 26.  She has anemia of chronic kidney disease. Hemoglobin was 10.9.  She had recently been diagnosed with an E coli UTI and received Augmentin.  At that visit, she was lightheaded and dizzy.  She appeared dehydrated.  She has a follow-up on around 04/03/2020.  She received IV fluids (NS 125 cc/hr x 4 hours) on 12/03/2019  Labs have been followed: 03/29/2019: Hematocrit 37.7.  Hemoglobin 11.4.  MCV 93.3.  Platelets 641,000.  WBC 13,300.  Creatinine 1.05. 04/27/2019: Hematocrit 38.8.  Hemoglobin 11.8.  MCV 88.8.  Platelets 618,000.  WBC 12,100.  Creatinine 1.12. 05/25/2019: Hematocrit 39.0.  Hemoglobin 12.2.  MCV 87.2.  Platelets 629,000.  WBC 14,200.  Creatinine 1.07.  06/22/2019: Hematocrit 36.8.  Hemoglobin 11.4.  MCV 87.0.  Platelets 649,000.  WBC 11,800.  Creatinine 1.19. 09/23/2019: Hematocrit 31.2.  Hemoglobin   9.7.  MCV 89.9.  Platelets 448,000.  WBC   9,700.  Creatinine 1.25.  During the interim, she had a lot of fatigue and severe back pain this morning. She is having significant memory loss and does not remember these symptoms. Her daughter notes that she is sleeping between 15 - 20  hours per day. She has not been drinking fluids as a result of this because she is only getting out of bed to eat her meals.  She limits her water intake secondary to incontinence issues.  Her daughter estimated that she is drinking around 24 oz of water per day.     When asked what the month was, she struggled to remember between August and September. She was able to remember the president. She knew she was in [place], [place], but she did not know what city she was in. She did not know her address or phone number. She was able to count backwards from 10. She said there was 5 nickles in a quarter, 8 quarters in a dollar. She was able to correctly make change from $1 to $0.85.  When asked to remember 3 words, she was not able to remember any of the words after 5 minutes even with prompting.    Past Medical History:  Diagnosis Date  . Polycythemia     Past Surgical History:  Procedure Laterality Date  . ABDOMINAL HYSTERECTOMY     complete  . BREAST SURGERY      Family History  Problem Relation Age of Onset  . Heart disease Father   . Cancer Sister        breast  . Cancer Sister        breast  . Alzheimer's disease Sister     Social History:  reports that she quit smoking about 41 years ago. She has  a 30.00 pack-year smoking history. She has never used smokeless tobacco. She reports that she does not drink alcohol and does not use drugs. She is widowed. Patient is retired Sport and exercise psychologist. Patient moved to [place] in 03/2017. She lives independently in her own apartment in [place]. Her daughter lives next door.Patient denies known exposures to radiation on toxins.Pt's daughter, [name] be reached at [phone]. She lives in [place].  The patient is accompanied by her daughter today.   Allergies: No Known Allergies  Current Medications: Current Outpatient Medications  Medication Sig Dispense Refill  . amoxicillin-clavulanate (AUGMENTIN) 875-125 MG tablet     . calcitRIOL (ROCALTROL) 0.25  MCG capsule Take 0.25 mcg by mouth 3 (three) times a week.    Marland Kitchen JAKAFI 10 MG tablet TAKE 1 TABLET (10 MG TOTAL) BY MOUTH 2 TIMES DAILY. 60 tablet 1  . JAKAFI 5 MG tablet TAKE 1 TABLET (5 MG TOTAL) BY MOUTH 2 (TWO) TIMES DAILY. (Patient not taking: Reported on 06/22/2019) 60 tablet 0   No current facility-administered medications for this visit.    Review of Systems  Constitutional: Positive for chills, malaise/fatigue (in bed 15-20 hrs/day) and weight loss (3 lbs). Negative for diaphoresis and fever.       "I am fine".  HENT: Positive for hearing loss. Negative for congestion, ear discharge, ear pain, nosebleeds, sinus pain, sore throat and tinnitus.   Eyes: Negative.  Negative for blurred vision.  Respiratory: Negative.  Negative for cough, hemoptysis, sputum production and shortness of breath.   Cardiovascular: Negative.  Negative for chest pain, palpitations and leg swelling.  Gastrointestinal: Negative.  Negative for abdominal pain, blood in stool, constipation, diarrhea, heartburn, melena, nausea and vomiting.       Has not been drinking a lot of fluids.  Genitourinary: Negative for dysuria, frequency, hematuria and urgency.       Incontinence  Musculoskeletal: Positive for back pain (reproducable with palpation). Negative for falls.  Skin: Negative.  Negative for itching and rash.  Neurological: Negative.  Negative for dizziness, tingling, sensory change, weakness and headaches.  Endo/Heme/Allergies: Negative.  Does not bruise/bleed easily.  Psychiatric/Behavioral: Positive for memory loss (worsened). Negative for depression. The patient is not nervous/anxious and does not have insomnia.   All other systems reviewed and are negative.  Performance status (ECOG): 1  Vitals Blood pressure 127/67, pulse 88, temperature (!) 97.4 F (36.3 C), temperature source Tympanic, weight 110 lb 15.7 oz (50.3 kg).   Physical Exam Vitals and nursing note reviewed.  Constitutional:      General:  She is not in acute distress.    Appearance: She is not diaphoretic.     Interventions: Face mask in place.  HENT:     Head: Normocephalic and atraumatic.     Comments: Short dark blonde hair.    Mouth/Throat:     Mouth: Mucous membranes are moist.     Pharynx: Oropharynx is clear.  Eyes:     General: No scleral icterus.    Extraocular Movements: Extraocular movements intact.     Conjunctiva/sclera: Conjunctivae normal.     Pupils: Pupils are equal, round, and reactive to light.     Comments: Blue eyes  Cardiovascular:     Rate and Rhythm: Normal rate and regular rhythm.     Pulses: Normal pulses.     Heart sounds: Normal heart sounds. No murmur heard.   Pulmonary:     Effort: Pulmonary effort is normal. No respiratory distress.     Breath sounds: Normal breath  sounds. No wheezing or rales.  Chest:     Chest wall: No tenderness.  Abdominal:     General: Bowel sounds are normal. There is no distension.     Palpations: Abdomen is soft. There is no mass.     Tenderness: There is no abdominal tenderness. There is no guarding or rebound.  Musculoskeletal:        General: No swelling or tenderness. Normal range of motion.     Cervical back: Normal range of motion and neck supple.  Skin:    General: Skin is warm and dry.  Neurological:     Mental Status: She is alert and oriented to person, place, and time. Mental status is at baseline.  Psychiatric:        Mood and Affect: Mood normal.        Behavior: Behavior is agitated (declines wanting to see neurologist vehemently and declines wanting IV fluids; she states that she could drink more water even though she has been combative at home via daughter's report).        Thought Content: Thought content normal.        Cognition and Memory: Memory is impaired. She exhibits impaired recent memory.        Judgment: Judgment is impulsive.    Office Visit on 12/20/2019  Component Date Value Ref Range Status  . Path Review 12/20/2019  Blood smear is reviewed.   Final   Comment: Thrombocytosis is present. There is significant RBC anisocytosis but minimal poikilocytosis and no teardrop cells. The automated differential shows a small proportion of nucleated RBCs, but I do not identify any on the smear. There is mild neutrophilic  leukocytosis with left shift including occasional myelocytes. No blasts are seen. The patient is known to have a persistent myeloproliferative neoplasm, with a history of polycythyemia vera and myelofibrosis, currently on Jakafi. The blood smear findings are consistent with the history and no new findings are seen. Reviewed by Lemmie Evens. Dicie Beam, MD. Performed at [facility], [address] [place], [place] [address]   . LDH 12/20/2019 210* 98 - 192 U/L Final   Performed at [facility], [address], [place], [place] [address]  . Erythropoietin 12/20/2019 105.0* 2.6 - 18.5 mIU/mL Final   Comment: (NOTE) Beckman [employer]ter UniCel DxI [place] obtained with different assay methods or kits cannot be used interchangeably. Results cannot be interpreted as absolute evidence of the presence or absence of malignant disease. Performed At: [facility] [place], [place] [id number] Rush Farmer MD WC:[id number]   . TSH 12/20/2019 2.085  0.350 - 4.500 uIU/mL Final   Comment: Performed by a 3rd Generation assay with a functional sensitivity of <=0.01 uIU/mL. Performed at [facility], [address] [place] [address], [place], [place] [address]   . Retic Ct Pct 12/20/2019 2.3  0.4 - 3.1 % Final  . RBC. 12/20/2019 2.84* 3.87 - 5.11 MIL/uL Final  . Retic Count, Absolute 12/20/2019 63.9  19.0 - 186.0 K/uL Final  . Immature Retic Fract 12/20/2019 29.6* 2.3 - 15.9 % Final   Performed at [facility], [address], [place],  [address]  . Folate 12/20/2019 19.1  >5.9 ng/mL Final   Performed at [facility], [address]  [place],  [address]  . Vitamin B-12 12/20/2019 292  180 - 914 pg/mL Final   Comment: (NOTE) This assay is not validated for testing neonatal or myeloproliferative syndrome specimens for Vitamin B12  levels. Performed at [facility], [place] [address] [place], [place] [address]   . Iron 12/20/2019 57  28 - 170 ug/dL Final  . TIBC 12/20/2019 223* 250 - 450 ug/dL Final  . Saturation Ratios 12/20/2019 26  10.4 - 31.8 % Final  . UIBC 12/20/2019 166  ug/dL Final   Performed at [facility], [address] [place], [place] [address]  . Ferritin 12/20/2019 728* 11 - 307 ng/mL Final   Performed at [facility], [address] [place], [place] [address]  . Sodium 12/20/2019 134* 135 - 145 mmol/L Final  . Potassium 12/20/2019 4.6  3.5 - 5.1 mmol/L Final  . Chloride 12/20/2019 98  98 - 111 mmol/L Final  . CO2 12/20/2019 26  22 - 32 mmol/L Final  . Glucose, Bld 12/20/2019 92  70 - 99 mg/dL Final   Glucose reference range applies only to samples taken after fasting for at least 8 hours.  . BUN 12/20/2019 28* 8 - 23 mg/dL Final  . Creatinine, Ser 12/20/2019 1.15* 0.44 - 1.00 mg/dL Final  . Calcium 12/20/2019 9.2  8.9 - 10.3 mg/dL Final  . Total Protein 12/20/2019 7.6  6.5 - 8.1 g/dL Final  . Albumin 12/20/2019 3.7  3.5 - 5.0 g/dL Final  . AST 12/20/2019 18  15 - 41 U/L Final  . ALT 12/20/2019 12  0 - 44 U/L Final  . Alkaline Phosphatase 12/20/2019 69  38 - 126 U/L Final  . Total Bilirubin 12/20/2019 0.4  0.3 - 1.2 mg/dL Final  . GFR calc non Af Amer 12/20/2019 42* >60 mL/min Final  . GFR calc Af Amer 12/20/2019 48* >60 mL/min Final  . Anion gap 12/20/2019 10  5 - 15 Final   Performed at [facility], [address], [place], [place] [address]  . WBC 12/20/2019 11.3* 4.0 - 10.5 K/uL Final  . RBC 12/20/2019 2.90* 3.87 - 5.11 MIL/uL Final  . Hemoglobin 12/20/2019 8.5* 12.0 - 15.0 g/dL Final  . HCT 12/20/2019 27.3* 36 - 46 % Final  . MCV 12/20/2019 94.1   80.0 - 100.0 fL Final  . MCH 12/20/2019 29.3  26.0 - 34.0 pg Final  . MCHC 12/20/2019 31.1  30.0 - 36.0 g/dL Final  . RDW 12/20/2019 22.3* 11.5 - 15.5 % Final  . Platelets 12/20/2019 562* 150 - 400 K/uL Final  . nRBC 12/20/2019 0.4* 0.0 - 0.2 % Final  . Neutrophils Relative % 12/20/2019 59  % Final  . Neutro Abs 12/20/2019 8.4* 1.7 - 7.7 K/uL Final  . Band Neutrophils 12/20/2019 15  % Final  . Lymphocytes Relative 12/20/2019 15  % Final  . Lymphs Abs 12/20/2019 1.7  0.7 - 4.0 K/uL Final  . Monocytes Relative 12/20/2019 4  % Final  . Monocytes Absolute 12/20/2019 0.5  0 - 1 K/uL Final  . Eosinophils Relative 12/20/2019 2  % Final  . Eosinophils Absolute 12/20/2019 0.2  0 - 0 K/uL Final  . Basophils Relative 12/20/2019 1  % Final  . Basophils Absolute 12/20/2019 0.1  0 - 0 K/uL Final  . Myelocytes 12/20/2019 4  % Final  . Abs Immature Granulocytes 12/20/2019 0.50* 0.00 - 0.07 K/uL Final  . Polychromasia 12/20/2019 PRESENT   Final   Performed at [facility], [address] [place],  [address]    Assessment:  Noor Witte is a 86 y.o. female with polycythemia rubra veraand secondary myelofibrosis. She presented in 04/2002 with  thrombocytosis (835,000). JAK2 V617Fwas positive on 05/20/2012.  Over the past year,hematocrithas ranged 37.5 - 42.4,hemoglobin12.1 - 13.4, platelets458,000 - 569,000, WBC10,500 - 15,700. Creatinine has been 1.0 - 1.2.  Bone marrowon 04/15/2002 revealed a normocellular marrow for age with trilineage hematopoiesis and mild megakaryocytic hyperplasia with focal atypia. There was no morphologic evidence of a myeloproliferative disorder. Iron stains were inadequate for evaluation of storage iron. There was no increase in reticulin fibers. She has been on agrylin since 2012.  Bone marrowon 09/25/2012 revealed a persistent myeloproliferative disorder s/p Agrylin. The current features were best classified as persistent  myeloproliferative neoplasm (MPN) JAK2 V617F mutation positive. Marrow was hypercellular for age (30-50%) with increased trilineage hematopoiesis and markedly increased atypical megakaryopoiesis with frequent clustering. There were no increased blasts. Iron stores were absent. There was moderate to severe myelofibrosis(grade MF 2-3). Differential included ET, PV, primary myelofibrosis, and MPN, unclassifiable. Cytogeneticswere normal (52, XX).  She developed pancytopeniain 12/2015. Etiology is unclear (medication confusion or hydroxyurea + Agrylin).  She received Procrit40,000 units (intermittently 09/2012 - 06/2016), Venofer(09/2012 - 10/2012), and Infed(12/2015 - 03/2016). She received Neupogenduring a period of neutropenia. She was anagrelidefrom 2004 - 08/13/2017. She beganJakafion 08/14/2017. Jakafi was increased from 5 mg BID to 10 mg BID on03/18/2020.Jakafi was increased to 15 mg BID on 08/03/2018.She has been back on Jakafi 5 mg BID since 02/01/2019 then 10 mg BID on 05/25/2019.  She began a phlebotomy programon 08/06/2017. She undergoes small volume (150 cc with replacement of 150 cc NS)if her HCT >42.  Ferritinhas been followed: 529 on 09/03/2017, 1020 on 12/03/2017, 423 on 02/18/2018, and 858 on 11/17/2018.Iron saturationwas 37%and TIBC 270 on08/18/2020.B12 was 477 and folate 36 on 11/17/2018.  She was admitted to [place] 12/17/2018 - 12/18/2018. She received 1 unit of PRBCs.CBC at discharge included a hematocrit of 29.0, hemoglobin 9.4, platelets 364,000, and WBC 10,200.  She received a course of Keflexfor a Proteus mirabilisUTIbeginning 01/11/2019.  The patient has received both doses of the COVID-19 vaccine.  Symptomatically, she has increased fatigue associated with increasing anemia.  She appears to have dementia.  Exam is stable.  Mentation has declined.  Plan: 1.   Labs today: CBC with diff, CMP, LDH, retic, ferritin, iron  studies, B12, folate, epo level. 2.Polycythemia rubra vera Hematocrit36.8.Hemoglobin11.4.Platelets649,000 on 06/22/2019.  Hematocrit32.2.Hemoglobin  9.7.Platelets448,000 on 09/23/2019.  Hematocrit 29.6.  Hemoglobin   9.2.  Platelets 521,000 on 11/29/2019.  Hematocrit 27.3.  Hemoglobin   8.5.  Platelets 562,000 on 12/20/2019 Platelet count goal < 400,000. Patient on Jakafi10 mg twice dailysince 05/25/2019. Discuss holding Jakafi despite elevated platelet count secondary to declining hemoglobin. Continue to monitor closely. 3.Renal insufficiency BUN28and creatinine  1.15. Discuss increasing fluid intake.   Patient states she will drink fluid but at home drinks minimal secondary to incontinence issues. Discuss plan for daily IVF in clinic. 4.Anemia  Etiology felt secondary to [facility].  Ferritin 728 with an iron saturation of 26% and a TIBC 223 today.  B12 was 292 with a folate of 19.1   B12 goal is 400.  Hold Jakafi as noted above as likely contributing factor.  She had may have some component of anemia of chronic renal disease.   Consider Retacrit. 5.   Dementia  Patient appears to have underlying dementia.  Discuss neurology consultation. 6.   IVF NS 125 cc/hr x 4 hours daily 09/21- 09/23 and 09/27 - 09/30. 7.   IVF NS 125 cc/hr x 4 hours in [facility] 09/24 and 10/01. 8.   Preauth Retacrit.  9.   Neurology consult (patient wishes to defer). 10.   RTC in 1 week for MD assessment and labs (CBC with diff, BMP).  I discussed the assessment and treatment plan with the patient.  The patient was provided an opportunity to ask questions and all were answered.  The patient agreed with the plan and demonstrated an understanding of the instructions.  The patient was advised to call back if the symptoms worsen or if the condition fails to improve as anticipated.   Lequita Asal, MD, PhD    12/20/2019, 10:06 PM   I, Jacqualyn Posey, am acting as a Education administrator for [employer]. Mike Gip, MD.   I, Talley Casco C. Mike Gip, MD, have reviewed the above documentation for accuracy and completeness, and I agree with the above.

## 2019-12-20 ENCOUNTER — Inpatient Hospital Stay (HOSPITAL_BASED_OUTPATIENT_CLINIC_OR_DEPARTMENT_OTHER): Payer: Medicare Other | Admitting: Hematology and Oncology

## 2019-12-20 ENCOUNTER — Other Ambulatory Visit: Payer: Self-pay

## 2019-12-20 ENCOUNTER — Inpatient Hospital Stay: Payer: Medicare Other

## 2019-12-20 ENCOUNTER — Other Ambulatory Visit: Payer: Self-pay | Admitting: Hematology and Oncology

## 2019-12-20 ENCOUNTER — Encounter: Payer: Self-pay | Admitting: Hematology and Oncology

## 2019-12-20 VITALS — BP 127/67 | HR 88 | Temp 97.4°F | Wt 111.0 lb

## 2019-12-20 DIAGNOSIS — D631 Anemia in chronic kidney disease: Secondary | ICD-10-CM | POA: Diagnosis not present

## 2019-12-20 DIAGNOSIS — M7918 Myalgia, other site: Secondary | ICD-10-CM | POA: Diagnosis not present

## 2019-12-20 DIAGNOSIS — D649 Anemia, unspecified: Secondary | ICD-10-CM

## 2019-12-20 DIAGNOSIS — D45 Polycythemia vera: Secondary | ICD-10-CM

## 2019-12-20 DIAGNOSIS — Z7189 Other specified counseling: Secondary | ICD-10-CM

## 2019-12-20 DIAGNOSIS — N1832 Chronic kidney disease, stage 3b: Secondary | ICD-10-CM | POA: Diagnosis not present

## 2019-12-20 DIAGNOSIS — R32 Unspecified urinary incontinence: Secondary | ICD-10-CM | POA: Diagnosis not present

## 2019-12-20 DIAGNOSIS — R5383 Other fatigue: Secondary | ICD-10-CM | POA: Diagnosis not present

## 2019-12-20 LAB — RETICULOCYTES
Immature Retic Fract: 29.6 % — ABNORMAL HIGH (ref 2.3–15.9)
RBC.: 2.84 MIL/uL — ABNORMAL LOW (ref 3.87–5.11)
Retic Count, Absolute: 63.9 10*3/uL (ref 19.0–186.0)
Retic Ct Pct: 2.3 % (ref 0.4–3.1)

## 2019-12-20 LAB — CBC WITH DIFFERENTIAL/PLATELET
Abs Immature Granulocytes: 0.5 10*3/uL — ABNORMAL HIGH (ref 0.00–0.07)
Band Neutrophils: 15 %
Basophils Absolute: 0.1 10*3/uL (ref 0.0–0.1)
Basophils Relative: 1 %
Eosinophils Absolute: 0.2 10*3/uL (ref 0.0–0.5)
Eosinophils Relative: 2 %
HCT: 27.3 % — ABNORMAL LOW (ref 36.0–46.0)
Hemoglobin: 8.5 g/dL — ABNORMAL LOW (ref 12.0–15.0)
Lymphocytes Relative: 15 %
Lymphs Abs: 1.7 10*3/uL (ref 0.7–4.0)
MCH: 29.3 pg (ref 26.0–34.0)
MCHC: 31.1 g/dL (ref 30.0–36.0)
MCV: 94.1 fL (ref 80.0–100.0)
Monocytes Absolute: 0.5 10*3/uL (ref 0.1–1.0)
Monocytes Relative: 4 %
Myelocytes: 4 %
Neutro Abs: 8.4 10*3/uL — ABNORMAL HIGH (ref 1.7–7.7)
Neutrophils Relative %: 59 %
Platelets: 562 10*3/uL — ABNORMAL HIGH (ref 150–400)
RBC: 2.9 MIL/uL — ABNORMAL LOW (ref 3.87–5.11)
RDW: 22.3 % — ABNORMAL HIGH (ref 11.5–15.5)
WBC: 11.3 10*3/uL — ABNORMAL HIGH (ref 4.0–10.5)
nRBC: 0.4 % — ABNORMAL HIGH (ref 0.0–0.2)

## 2019-12-20 LAB — COMPREHENSIVE METABOLIC PANEL
ALT: 12 U/L (ref 0–44)
AST: 18 U/L (ref 15–41)
Albumin: 3.7 g/dL (ref 3.5–5.0)
Alkaline Phosphatase: 69 U/L (ref 38–126)
Anion gap: 10 (ref 5–15)
BUN: 28 mg/dL — ABNORMAL HIGH (ref 8–23)
CO2: 26 mmol/L (ref 22–32)
Calcium: 9.2 mg/dL (ref 8.9–10.3)
Chloride: 98 mmol/L (ref 98–111)
Creatinine, Ser: 1.15 mg/dL — ABNORMAL HIGH (ref 0.44–1.00)
GFR calc Af Amer: 48 mL/min — ABNORMAL LOW (ref >60)
GFR calc non Af Amer: 42 mL/min — ABNORMAL LOW (ref >60)
Glucose, Bld: 92 mg/dL (ref 70–99)
Potassium: 4.6 mmol/L (ref 3.5–5.1)
Sodium: 134 mmol/L — ABNORMAL LOW (ref 135–145)
Total Bilirubin: 0.4 mg/dL (ref 0.3–1.2)
Total Protein: 7.6 g/dL (ref 6.5–8.1)

## 2019-12-20 LAB — FOLATE: Folate: 19.1 ng/mL (ref >5.9)

## 2019-12-20 LAB — LACTATE DEHYDROGENASE: LDH: 210 U/L — ABNORMAL HIGH (ref 98–192)

## 2019-12-20 LAB — IRON AND TIBC
Iron: 57 ug/dL (ref 28–170)
Saturation Ratios: 26 % (ref 10.4–31.8)
TIBC: 223 ug/dL — ABNORMAL LOW (ref 250–450)
UIBC: 166 ug/dL

## 2019-12-20 LAB — FERRITIN: Ferritin: 728 ng/mL — ABNORMAL HIGH (ref 11–307)

## 2019-12-20 LAB — TSH: TSH: 2.085 u[IU]/mL (ref 0.350–4.500)

## 2019-12-20 LAB — VITAMIN B12: Vitamin B-12: 292 pg/mL (ref 180–914)

## 2019-12-20 NOTE — Progress Notes (Signed)
Current UTI and c/o lower back pain ( 6) this morning

## 2019-12-20 NOTE — Patient Instructions (Signed)
  Hold Jakafi.  Measure fluids every day and record.

## 2019-12-21 ENCOUNTER — Inpatient Hospital Stay: Payer: Medicare Other

## 2019-12-21 VITALS — BP 98/66 | HR 80 | Temp 98.2°F | Resp 18

## 2019-12-21 DIAGNOSIS — R5383 Other fatigue: Secondary | ICD-10-CM | POA: Diagnosis not present

## 2019-12-21 DIAGNOSIS — R32 Unspecified urinary incontinence: Secondary | ICD-10-CM | POA: Diagnosis not present

## 2019-12-21 DIAGNOSIS — E860 Dehydration: Secondary | ICD-10-CM

## 2019-12-21 DIAGNOSIS — N1832 Chronic kidney disease, stage 3b: Secondary | ICD-10-CM

## 2019-12-21 DIAGNOSIS — N289 Disorder of kidney and ureter, unspecified: Secondary | ICD-10-CM

## 2019-12-21 DIAGNOSIS — D631 Anemia in chronic kidney disease: Secondary | ICD-10-CM | POA: Diagnosis not present

## 2019-12-21 DIAGNOSIS — M7918 Myalgia, other site: Secondary | ICD-10-CM | POA: Diagnosis not present

## 2019-12-21 DIAGNOSIS — D45 Polycythemia vera: Secondary | ICD-10-CM | POA: Diagnosis not present

## 2019-12-21 LAB — PATHOLOGIST SMEAR REVIEW

## 2019-12-21 LAB — ERYTHROPOIETIN: Erythropoietin: 105 m[IU]/mL — ABNORMAL HIGH (ref 2.6–18.5)

## 2019-12-21 MED ORDER — SODIUM CHLORIDE 0.9 % IV SOLN
Freq: Once | INTRAVENOUS | Status: AC
  Filled 2019-12-21: qty 250

## 2019-12-22 ENCOUNTER — Inpatient Hospital Stay: Payer: Medicare Other

## 2019-12-22 ENCOUNTER — Telehealth: Payer: Self-pay

## 2019-12-22 ENCOUNTER — Other Ambulatory Visit: Payer: Self-pay

## 2019-12-22 VITALS — BP 112/60 | HR 78 | Temp 98.3°F | Resp 18

## 2019-12-22 DIAGNOSIS — D631 Anemia in chronic kidney disease: Secondary | ICD-10-CM | POA: Diagnosis not present

## 2019-12-22 DIAGNOSIS — E860 Dehydration: Secondary | ICD-10-CM

## 2019-12-22 DIAGNOSIS — N1832 Chronic kidney disease, stage 3b: Secondary | ICD-10-CM | POA: Diagnosis not present

## 2019-12-22 DIAGNOSIS — D45 Polycythemia vera: Secondary | ICD-10-CM | POA: Diagnosis not present

## 2019-12-22 DIAGNOSIS — R5383 Other fatigue: Secondary | ICD-10-CM | POA: Diagnosis not present

## 2019-12-22 DIAGNOSIS — M7918 Myalgia, other site: Secondary | ICD-10-CM | POA: Diagnosis not present

## 2019-12-22 DIAGNOSIS — R32 Unspecified urinary incontinence: Secondary | ICD-10-CM | POA: Diagnosis not present

## 2019-12-22 MED ORDER — SODIUM CHLORIDE 0.9 % IV SOLN
Freq: Once | INTRAVENOUS | Status: AC
  Filled 2019-12-22: qty 250

## 2019-12-23 ENCOUNTER — Inpatient Hospital Stay: Payer: Medicare Other

## 2019-12-23 DIAGNOSIS — D45 Polycythemia vera: Secondary | ICD-10-CM | POA: Diagnosis not present

## 2019-12-23 DIAGNOSIS — N1832 Chronic kidney disease, stage 3b: Secondary | ICD-10-CM | POA: Diagnosis not present

## 2019-12-23 DIAGNOSIS — M7918 Myalgia, other site: Secondary | ICD-10-CM | POA: Diagnosis not present

## 2019-12-23 DIAGNOSIS — D631 Anemia in chronic kidney disease: Secondary | ICD-10-CM | POA: Diagnosis not present

## 2019-12-23 DIAGNOSIS — R32 Unspecified urinary incontinence: Secondary | ICD-10-CM | POA: Diagnosis not present

## 2019-12-23 DIAGNOSIS — R5383 Other fatigue: Secondary | ICD-10-CM | POA: Diagnosis not present

## 2019-12-23 MED ORDER — SODIUM CHLORIDE 0.9 % IV SOLN
INTRAVENOUS | Status: AC
  Filled 2019-12-23 (×2): qty 250

## 2019-12-24 ENCOUNTER — Other Ambulatory Visit: Payer: Self-pay

## 2019-12-24 ENCOUNTER — Inpatient Hospital Stay: Payer: Medicare Other

## 2019-12-24 VITALS — BP 152/63 | HR 80 | Temp 98.4°F | Resp 18

## 2019-12-24 DIAGNOSIS — N1832 Chronic kidney disease, stage 3b: Secondary | ICD-10-CM | POA: Diagnosis not present

## 2019-12-24 DIAGNOSIS — R5383 Other fatigue: Secondary | ICD-10-CM | POA: Diagnosis not present

## 2019-12-24 DIAGNOSIS — D45 Polycythemia vera: Secondary | ICD-10-CM | POA: Diagnosis not present

## 2019-12-24 DIAGNOSIS — D631 Anemia in chronic kidney disease: Secondary | ICD-10-CM | POA: Diagnosis not present

## 2019-12-24 DIAGNOSIS — R32 Unspecified urinary incontinence: Secondary | ICD-10-CM | POA: Diagnosis not present

## 2019-12-24 DIAGNOSIS — M7918 Myalgia, other site: Secondary | ICD-10-CM | POA: Diagnosis not present

## 2019-12-24 MED ORDER — EPOETIN ALFA-EPBX 10000 UNIT/ML IJ SOLN
10000.0000 [IU] | Freq: Once | INTRAMUSCULAR | Status: AC
  Administered 2019-12-24: 10000 [IU] via SUBCUTANEOUS
  Filled 2019-12-24: qty 1

## 2019-12-24 MED ORDER — SODIUM CHLORIDE 0.9 % IV SOLN
Freq: Once | INTRAVENOUS | Status: AC
  Filled 2019-12-24: qty 250

## 2019-12-25 NOTE — Progress Notes (Signed)
[facility]  [address], [address] [place], [place] [address] Phone: [phone]  Fax: [phone]   Clinic Day:  12/27/2019   Referring physician: No ref. provider found  Chief Complaint: Laura Wilcox is a 86 y.o. female with polycythemia rubra vera (PV)on Jakafi who is seen for 1 week assessment.   HPI: The patient was last seen in the hematology clinic on 12/20/2019. At that time, she had increased fatigue associated with increasing anemia.  She appeared to have dementia.  Exam was stable.  Mentation had declined.  Hematocrit was 27.3, hemoglobin 8.5, MCV 94.1, platelets 11,300 (ANC 8,400).  Retic was 2.3%.  Ferritin was 728 with iron saturation of 26% and a TIBC of 223. LDH was 210. Sodium was 134 and creatinine 1.15. Erythropoietin 105.0. TSH was 2.085.  Vitamin B12 was 292 (low normal) with folate 19.1.   Peripheral smear revealed thrombocytosis. There was significant RBC anisocytosis but minimal poikilocytosis and no teardrop cells. The automated differential showed a small proportion of nucleated RBCs, not identified on the smear.  There was a mild neutrophilic leukocytosis with left shift including occasional myelocytes. No blasts were seen.  The patient has a known persistent myeloproliferative liferative neoplasm. The blood smear findings were consistent with the history; no new findings were seen.   Laura Wilcox was held.  She received daily fluids (12/21/2019 - 12/24/2019).  She received Retacrit on 12/24/2019.  She declined referral to neurology.  During the interim, she was feeling good and a little sleepy. Her daughter reports she is drinking an average of 24 oz (720 cc) of water per day. The patient feels like her memory is stable but she tends forget things. He daughter notes that she is more like herself after IV fluids. Her daughter reports the mornings are more difficult and she is fatigued and her thinking is fuzzy. This morning was her most  challenging morning yet.   Her daughter notes that she was able to go into a store the other day for 5 minutes. She bought a couple of books but her daughter reports she has been too tired to read. The patient is not interested in going to the neurologist.  When asked to remember blue, 7, and different she was unable to remember.   Laura Wilcox was stopped on 12/20/2019 per patient's daughter.   Her mother has completed an antibiotic course for a UTI and her daughter would like to give a urine sample to reassure her infection has cleared. She feels like her mother stopped drinking fluids during her UTI because it effected her sleep pattern.   She will have an initial consult with her new PCP in 01/2020.    Past Medical History:  Diagnosis Date  . Polycythemia     Past Surgical History:  Procedure Laterality Date  . ABDOMINAL HYSTERECTOMY     complete  . BREAST SURGERY      Family History  Problem Relation Age of Onset  . Heart disease Father   . Cancer Sister        breast  . Cancer Sister        breast  . Alzheimer's disease Sister     Social History:  reports that she quit smoking about 41 years ago. She has a 30.00 pack-year smoking history. She has never used smokeless tobacco. She reports that she does not drink alcohol and does not use drugs. She is widowed. Patient is retired Sport and exercise psychologist. Patient moved to [facility] in 03/2017. She lives independently  in her own apartment in [place]. Her daughter lives next door.Patient denies known exposures to radiation on toxins.Pt's daughter, [name] be reached at [phone]. She lives in [place].  The patient is accompanied by her daughter today.   Allergies: No Known Allergies  Current Medications: Current Outpatient Medications  Medication Sig Dispense Refill  . amoxicillin-clavulanate (AUGMENTIN) 875-125 MG tablet  (Patient not taking: Reported on 12/27/2019)    . calcitRIOL (ROCALTROL) 0.25 MCG capsule Take 0.25 mcg by mouth 3 (three)  times a week. (Patient not taking: Reported on 12/27/2019)    . JAKAFI 10 MG tablet TAKE 1 TABLET (10 MG TOTAL) BY MOUTH 2 TIMES DAILY. (Patient not taking: Reported on 12/27/2019) 60 tablet 1  . JAKAFI 5 MG tablet TAKE 1 TABLET (5 MG TOTAL) BY MOUTH 2 (TWO) TIMES DAILY. (Patient not taking: Reported on 06/22/2019) 60 tablet 0   No current facility-administered medications for this visit.   Facility-Administered Medications Ordered in Other Visits  Medication Dose Route Frequency Provider Last Rate Last Admin  . 0.9 %  sodium chloride infusion   Intravenous Once Lequita Asal, MD        Review of Systems  Constitutional: Positive for malaise/fatigue (in bed 15-20 hrs/day). Negative for chills, diaphoresis, fever and weight loss (stable).       Feels "good and sleepy".  HENT: Positive for hearing loss. Negative for congestion, ear discharge, ear pain, nosebleeds, sinus pain, sore throat and tinnitus.   Eyes: Negative.  Negative for blurred vision.  Respiratory: Negative.  Negative for cough, hemoptysis, sputum production and shortness of breath.   Cardiovascular: Negative.  Negative for chest pain, palpitations and leg swelling.  Gastrointestinal: Negative.  Negative for abdominal pain, blood in stool, constipation, diarrhea, heartburn, melena, nausea and vomiting.       Drinking 24 oz of fluids.  Genitourinary: Negative for dysuria, frequency, hematuria and urgency.       Incontinence  Musculoskeletal: Negative for back pain (reproducable with palpation) and falls.  Skin: Negative.  Negative for itching and rash.  Neurological: Negative.  Negative for dizziness, tingling, sensory change, weakness and headaches.  Endo/Heme/Allergies: Negative.  Does not bruise/bleed easily.  Psychiatric/Behavioral: Positive for memory loss (worsened). Negative for depression. The patient is not nervous/anxious and does not have insomnia.   All other systems reviewed and are negative.  Performance  status (ECOG): 1  Vitals Blood pressure 123/61, pulse 89, temperature 98.3 F (36.8 C), temperature source Tympanic, weight 111 lb 1.8 oz (50.4 kg), SpO2 99 %.   Physical Exam Vitals and nursing note reviewed.  Constitutional:      General: She is not in acute distress.    Appearance: She is not diaphoretic.     Interventions: Face mask in place.  HENT:     Head: Normocephalic and atraumatic.     Comments: Short dark blonde hair.    Mouth/Throat:     Mouth: Mucous membranes are moist.     Pharynx: Oropharynx is clear.  Eyes:     General: No scleral icterus.    Extraocular Movements: Extraocular movements intact.     Conjunctiva/sclera: Conjunctivae normal.     Pupils: Pupils are equal, round, and reactive to light.     Comments: Blue eyes  Cardiovascular:     Rate and Rhythm: Normal rate and regular rhythm.     Pulses: Normal pulses.     Heart sounds: Normal heart sounds. No murmur heard.   Pulmonary:     Effort: Pulmonary effort  is normal. No respiratory distress.     Breath sounds: Normal breath sounds. No wheezing or rales.  Chest:     Chest wall: No tenderness.  Abdominal:     General: Bowel sounds are normal. There is no distension.     Palpations: Abdomen is soft. There is no hepatomegaly, splenomegaly or mass.     Tenderness: There is no abdominal tenderness. There is no guarding or rebound.  Musculoskeletal:        General: No swelling or tenderness. Normal range of motion.     Cervical back: Normal range of motion and neck supple.  Lymphadenopathy:     Head:     Right side of head: No preauricular, posterior auricular or occipital adenopathy.     Left side of head: No preauricular, posterior auricular or occipital adenopathy.     Cervical: No cervical adenopathy.     Upper Body:     Right upper body: No supraclavicular or axillary adenopathy.     Left upper body: No supraclavicular or axillary adenopathy.     Lower Body: No right inguinal adenopathy. No left  inguinal adenopathy.  Skin:    General: Skin is warm and dry.  Neurological:     Mental Status: She is alert and oriented to person, place, and time. Mental status is at baseline.     Comments: More alert.  Unable to remember 3 words in 5 minutes.  Psychiatric:        Mood and Affect: Mood normal.        Thought Content: Thought content normal.        Cognition and Memory: Memory is impaired. She exhibits impaired recent memory.    Appointment on 12/27/2019  Component Date Value Ref Range Status  . Sodium 12/27/2019 135  135 - 145 mmol/L Final  . Potassium 12/27/2019 3.7  3.5 - 5.1 mmol/L Final  . Chloride 12/27/2019 100  98 - 111 mmol/L Final  . CO2 12/27/2019 25  22 - 32 mmol/L Final  . Glucose, Bld 12/27/2019 102* 70 - 99 mg/dL Final   Glucose reference range applies only to samples taken after fasting for at least 8 hours.  . BUN 12/27/2019 15  8 - 23 mg/dL Final  . Creatinine, Ser 12/27/2019 1.14* 0.44 - 1.00 mg/dL Final  . Calcium 12/27/2019 8.9  8.9 - 10.3 mg/dL Final  . GFR calc non Af Amer 12/27/2019 42* >60 mL/min Final  . GFR calc Af Amer 12/27/2019 49* >60 mL/min Final  . Anion gap 12/27/2019 10  5 - 15 Final   Performed at [facility], [address] [place], [place] [address]  . WBC 12/27/2019 10.3  4.0 - 10.5 K/uL Final  . RBC 12/27/2019 2.86* 3.87 - 5.11 MIL/uL Final  . Hemoglobin 12/27/2019 8.4* 12.0 - 15.0 g/dL Final  . HCT 12/27/2019 27.6* 36 - 46 % Final  . MCV 12/27/2019 96.5  80.0 - 100.0 fL Final  . MCH 12/27/2019 29.4  26.0 - 34.0 pg Final  . MCHC 12/27/2019 30.4  30.0 - 36.0 g/dL Final  . RDW 12/27/2019 22.7* 11.5 - 15.5 % Final  . Platelets 12/27/2019 514* 150 - 400 K/uL Final  . nRBC 12/27/2019 0.6* 0.0 - 0.2 % Final   Performed at [facility], [address] [place], [place] [address]  . Neutrophils Relative % 12/27/2019 PENDING  % Incomplete  . Neutro Abs 12/27/2019 PENDING  1.7 - 7.7 K/uL Incomplete  . Band  Neutrophils 12/27/2019 PENDING  % Incomplete  . Lymphocytes Relative 12/27/2019 PENDING  % Incomplete  . Lymphs Abs 12/27/2019 PENDING  0.7 - 4.0 K/uL Incomplete  . Monocytes Relative 12/27/2019 PENDING  % Incomplete  . Monocytes Absolute 12/27/2019 PENDING  0 - 1 K/uL Incomplete  . Eosinophils Relative 12/27/2019 PENDING  % Incomplete  . Eosinophils Absolute 12/27/2019 PENDING  0 - 0 K/uL Incomplete  . Basophils Relative 12/27/2019 PENDING  % Incomplete  . Basophils Absolute 12/27/2019 PENDING  0 - 0 K/uL Incomplete  . WBC Morphology 12/27/2019 PENDING   Incomplete  . RBC Morphology 12/27/2019 PENDING   Incomplete  . Smear Review 12/27/2019 PENDING   Incomplete  . Other 12/27/2019 PENDING  % Incomplete  . nRBC 12/27/2019 PENDING  0 /100 WBC Incomplete  . Metamyelocytes Relative 12/27/2019 PENDING  % Incomplete  . Myelocytes 12/27/2019 PENDING  % Incomplete  . Promyelocytes Relative 12/27/2019 PENDING  % Incomplete  . Blasts 12/27/2019 PENDING  % Incomplete  . Immature Granulocytes 12/27/2019 PENDING  % Incomplete  . Abs Immature Granulocytes 12/27/2019 PENDING  0.00 - 0.07 K/uL Incomplete    Assessment:  Laura Wilcox is a 86 y.o. female with polycythemia rubra veraand secondary myelofibrosis. She presented in 04/2002 with thrombocytosis (835,000). JAK2 V617Fwas positive on 05/20/2012.  Over the past year,hematocrithas ranged 37.5 - 42.4,hemoglobin12.1 - 13.4, platelets458,000 - 569,000, WBC10,500 - 15,700. Creatinine has been 1.0 - 1.2.  Bone marrowon 04/15/2002 revealed a normocellular marrow for age with trilineage hematopoiesis and mild megakaryocytic hyperplasia with focal atypia. There was no morphologic evidence of a myeloproliferative disorder. Iron stains were inadequate for evaluation of storage iron. There was no increase in reticulin fibers. She has been on agrylin since 2012.  Bone marrowon 09/25/2012 revealed a persistent myeloproliferative disorder  s/p Agrylin. The current features were best classified as persistent myeloproliferative neoplasm (MPN) JAK2 V617F mutation positive. Marrow was hypercellular for age (30-50%) with increased trilineage hematopoiesis and markedly increased atypical megakaryopoiesis with frequent clustering. There were no increased blasts. Iron stores were absent. There was moderate to severe myelofibrosis(grade MF 2-3). Differential included ET, PV, primary myelofibrosis, and MPN, unclassifiable. Cytogeneticswere normal (34, XX).  She developed pancytopeniain 12/2015. Etiology is unclear (medication confusion or hydroxyurea + Agrylin).  She received Procrit40,000 units (intermittently 09/2012 - 06/2016), Venofer(09/2012 - 10/2012), and Infed(12/2015 - 03/2016). She received Neupogenduring a period of neutropenia. She was anagrelidefrom 2004 - 08/13/2017. She beganJakafion 08/14/2017. Jakafi was increased from 5 mg BID to 10 mg BID on03/18/2020.Jakafi was increased to 15 mg BID on 08/03/2018.She has been back on Jakafi 5 mg BID since 02/01/2019 then 10 mg BID on 05/25/2019.  Laura Wilcox has been on hold since 12/20/2019.  She began a phlebotomy programon 08/06/2017. She undergoes small volume (150 cc with replacement of 150 cc NS)if her HCT >42.  Ferritinhas been followed: 529 on 09/03/2017, 1020 on 12/03/2017, 423 on 02/18/2018, 858 on 11/17/2018, 785 on 09/23/2019 and 728 on 12/20/2019.Iron saturationwas 37%and TIBC 270 on08/18/2020.B12 was 477 and folate 36 on 11/17/2018.  B12 was 292 and folate 19.1 on 12/20/2019  She was admitted to [facility] 12/17/2018 - 12/18/2018. She received 1 unit of PRBCs.CBC at discharge included a hematocrit of 29.0, hemoglobin 9.4, platelets 364,000, and WBC 10,200.  She received a course of Keflexfor a Proteus mirabilisUTIbeginning 01/11/2019.  The patient has received both doses of the COVID-19 vaccine.  Symptomatically, she is more alert.   Memory is poor.  She is drinking little.  Energy level improves after fluids.  Her daughter is concerned about a UTI.  Plan: 1.   Labs today: CBC with diff, BMP. 2.Polycythemia rubra vera             Hematocrit32.2.Hemoglobin 9.7.Platelets448,000 on 09/23/2019.             Hematocrit 29.6.  Hemoglobin  9.2.  Platelets 521,000 on 11/29/2019.             Hematocrit 27.3.  Hemoglobin  8.5.  Platelets 562,000 on 12/20/2019.  Hematocrit 27.6.  Hemoglobin  8.4.  Platelets 514,000 on 12/27/2019 Platelet count goal < 400,000. Laura Wilcox has been on hold since 12/20/2019 Continue to monitor. 3.Renal insufficiency BUN28and creatinine1.15 on 12/20/2019. BUN 15 and creatinine 1.14 on 12/27/2019. Patient currently only drinking approximately 720 cc a day.   Patient states she will drink fluid but at home drinks minimal secondary to incontinence issues. Continue to encourage oral fluid intake to avoid need for IV fluids. Continue IV fluids this week. 4.Anemia             Etiology felt secondary to [facility].             Ferritin 728 with an iron saturation of 26% and a TIBC 223 on 12/20/2019.             B12 was 292 with a folate of 19.1 on 12/20/2019.              B12 goal is 400.             Jakafi remains on hold.             Patient received Retacrit on 12/24/2019.  Continue Retacrit every 2 weeks per protocol. 5.   Dementia             Patient appears to have underlying dementia.             Patient adamantly declines neurology consultation. 6.   History of UTI  Do not feel UTI contributing to mentation.  Check UA and culture. 7.   IVF today and daily this week (already scheduled). 8.   RTC in 1 week for MD assessment and labs (CBC with diff, BMP).  I discussed the assessment and treatment plan with the patient.  The patient was provided an opportunity to ask questions and all were answered.  The patient agreed with the plan and demonstrated an understanding of the  instructions.  The patient was advised to call back if the symptoms worsen or if the condition fails to improve as anticipated.  I provided 20 minutes of face-to-face time during this encounter and > 50% was spent counseling as documented under my assessment and plan.  An additional 10 minutes were spent reviewing her chart (Epic and Care Everywhere) including notes, labs, and imaging studies.    Lequita Asal, MD, PhD    12/27/2019, 10:25 AM   I, Selena Batten, am acting as scribe for [employer]. Mike Gip, MD, PhD.  I, Rich Paprocki C. Mike Gip, MD, have reviewed the above documentation for accuracy and completeness, and I agree with the above.

## 2019-12-27 ENCOUNTER — Inpatient Hospital Stay (HOSPITAL_BASED_OUTPATIENT_CLINIC_OR_DEPARTMENT_OTHER): Payer: Medicare Other | Admitting: Hematology and Oncology

## 2019-12-27 ENCOUNTER — Other Ambulatory Visit: Payer: Medicare Other

## 2019-12-27 ENCOUNTER — Other Ambulatory Visit: Payer: Self-pay

## 2019-12-27 ENCOUNTER — Inpatient Hospital Stay: Payer: Medicare Other

## 2019-12-27 ENCOUNTER — Other Ambulatory Visit: Payer: Self-pay | Admitting: *Deleted

## 2019-12-27 ENCOUNTER — Ambulatory Visit: Payer: Medicare Other | Admitting: Hematology and Oncology

## 2019-12-27 ENCOUNTER — Encounter: Payer: Self-pay | Admitting: Hematology and Oncology

## 2019-12-27 ENCOUNTER — Other Ambulatory Visit: Payer: Self-pay | Admitting: Hematology and Oncology

## 2019-12-27 VITALS — BP 123/61 | HR 89 | Temp 98.3°F | Wt 111.1 lb

## 2019-12-27 DIAGNOSIS — N289 Disorder of kidney and ureter, unspecified: Secondary | ICD-10-CM | POA: Diagnosis not present

## 2019-12-27 DIAGNOSIS — G3184 Mild cognitive impairment, so stated: Secondary | ICD-10-CM

## 2019-12-27 DIAGNOSIS — N390 Urinary tract infection, site not specified: Secondary | ICD-10-CM | POA: Diagnosis not present

## 2019-12-27 DIAGNOSIS — Z7189 Other specified counseling: Secondary | ICD-10-CM

## 2019-12-27 DIAGNOSIS — N1832 Chronic kidney disease, stage 3b: Secondary | ICD-10-CM

## 2019-12-27 DIAGNOSIS — D45 Polycythemia vera: Secondary | ICD-10-CM | POA: Diagnosis not present

## 2019-12-27 DIAGNOSIS — D631 Anemia in chronic kidney disease: Secondary | ICD-10-CM | POA: Diagnosis not present

## 2019-12-27 DIAGNOSIS — M7918 Myalgia, other site: Secondary | ICD-10-CM | POA: Diagnosis not present

## 2019-12-27 DIAGNOSIS — D649 Anemia, unspecified: Secondary | ICD-10-CM | POA: Diagnosis not present

## 2019-12-27 DIAGNOSIS — R5383 Other fatigue: Secondary | ICD-10-CM | POA: Diagnosis not present

## 2019-12-27 DIAGNOSIS — R32 Unspecified urinary incontinence: Secondary | ICD-10-CM | POA: Diagnosis not present

## 2019-12-27 LAB — CBC WITH DIFFERENTIAL/PLATELET
Abs Immature Granulocytes: 0.73 10*3/uL — ABNORMAL HIGH (ref 0.00–0.07)
Basophils Absolute: 0.1 10*3/uL (ref 0.0–0.1)
Basophils Relative: 1 %
Eosinophils Absolute: 0.5 10*3/uL (ref 0.0–0.5)
Eosinophils Relative: 5 %
HCT: 27.6 % — ABNORMAL LOW (ref 36.0–46.0)
Hemoglobin: 8.4 g/dL — ABNORMAL LOW (ref 12.0–15.0)
Immature Granulocytes: 7 %
Lymphocytes Relative: 12 %
Lymphs Abs: 1.3 10*3/uL (ref 0.7–4.0)
MCH: 29.4 pg (ref 26.0–34.0)
MCHC: 30.4 g/dL (ref 30.0–36.0)
MCV: 96.5 fL (ref 80.0–100.0)
Monocytes Absolute: 1 10*3/uL (ref 0.1–1.0)
Monocytes Relative: 10 %
Neutro Abs: 6.7 10*3/uL (ref 1.7–7.7)
Neutrophils Relative %: 65 %
Platelets: 514 10*3/uL — ABNORMAL HIGH (ref 150–400)
RBC: 2.86 MIL/uL — ABNORMAL LOW (ref 3.87–5.11)
RDW: 22.7 % — ABNORMAL HIGH (ref 11.5–15.5)
WBC: 10.3 10*3/uL (ref 4.0–10.5)
nRBC: 0.6 % — ABNORMAL HIGH (ref 0.0–0.2)

## 2019-12-27 LAB — BASIC METABOLIC PANEL
Anion gap: 10 (ref 5–15)
BUN: 15 mg/dL (ref 8–23)
CO2: 25 mmol/L (ref 22–32)
Calcium: 8.9 mg/dL (ref 8.9–10.3)
Chloride: 100 mmol/L (ref 98–111)
Creatinine, Ser: 1.14 mg/dL — ABNORMAL HIGH (ref 0.44–1.00)
GFR calc Af Amer: 49 mL/min — ABNORMAL LOW (ref >60)
GFR calc non Af Amer: 42 mL/min — ABNORMAL LOW (ref >60)
Glucose, Bld: 102 mg/dL — ABNORMAL HIGH (ref 70–99)
Potassium: 3.7 mmol/L (ref 3.5–5.1)
Sodium: 135 mmol/L (ref 135–145)

## 2019-12-27 LAB — URINALYSIS, COMPLETE (UACMP) WITH MICROSCOPIC
Glucose, UA: NEGATIVE mg/dL
Ketones, ur: 15 mg/dL — AB
Nitrite: POSITIVE — AB
Protein, ur: 30 mg/dL — AB
Specific Gravity, Urine: 1.02 (ref 1.005–1.030)
Squamous Epithelial / HPF: NONE SEEN (ref 0–5)
WBC, UA: >50 WBC/hpf (ref 0–5)
pH: 5.5 (ref 5.0–8.0)

## 2019-12-27 MED ORDER — SODIUM CHLORIDE 0.9 % IV SOLN
Freq: Once | INTRAVENOUS | Status: AC
  Filled 2019-12-27: qty 250

## 2019-12-27 MED ORDER — SODIUM CHLORIDE 0.9 % IV SOLN
Freq: Once | INTRAVENOUS | Status: DC
  Filled 2019-12-27: qty 250

## 2019-12-27 NOTE — Progress Notes (Signed)
JAKAFI was stopped on Monday per patient daughter

## 2019-12-28 ENCOUNTER — Inpatient Hospital Stay: Payer: Medicare Other

## 2019-12-28 VITALS — BP 135/71 | HR 80 | Temp 96.7°F | Wt 111.8 lb

## 2019-12-28 DIAGNOSIS — D45 Polycythemia vera: Secondary | ICD-10-CM

## 2019-12-28 DIAGNOSIS — M7918 Myalgia, other site: Secondary | ICD-10-CM | POA: Diagnosis not present

## 2019-12-28 DIAGNOSIS — N1832 Chronic kidney disease, stage 3b: Secondary | ICD-10-CM | POA: Diagnosis not present

## 2019-12-28 DIAGNOSIS — R32 Unspecified urinary incontinence: Secondary | ICD-10-CM | POA: Diagnosis not present

## 2019-12-28 DIAGNOSIS — R5383 Other fatigue: Secondary | ICD-10-CM | POA: Diagnosis not present

## 2019-12-28 DIAGNOSIS — D631 Anemia in chronic kidney disease: Secondary | ICD-10-CM | POA: Diagnosis not present

## 2019-12-28 MED ORDER — SODIUM CHLORIDE 0.9 % IV SOLN
Freq: Once | INTRAVENOUS | Status: AC
  Filled 2019-12-28: qty 250

## 2019-12-29 ENCOUNTER — Other Ambulatory Visit: Payer: Self-pay

## 2019-12-29 ENCOUNTER — Encounter: Payer: Self-pay | Admitting: Hematology and Oncology

## 2019-12-29 ENCOUNTER — Inpatient Hospital Stay: Payer: Medicare Other

## 2019-12-29 VITALS — BP 147/70 | HR 81 | Temp 96.1°F

## 2019-12-29 DIAGNOSIS — M7918 Myalgia, other site: Secondary | ICD-10-CM | POA: Diagnosis not present

## 2019-12-29 DIAGNOSIS — R32 Unspecified urinary incontinence: Secondary | ICD-10-CM | POA: Diagnosis not present

## 2019-12-29 DIAGNOSIS — D45 Polycythemia vera: Secondary | ICD-10-CM

## 2019-12-29 DIAGNOSIS — R5383 Other fatigue: Secondary | ICD-10-CM | POA: Diagnosis not present

## 2019-12-29 DIAGNOSIS — D631 Anemia in chronic kidney disease: Secondary | ICD-10-CM | POA: Diagnosis not present

## 2019-12-29 DIAGNOSIS — N1832 Chronic kidney disease, stage 3b: Secondary | ICD-10-CM | POA: Diagnosis not present

## 2019-12-29 MED ORDER — SODIUM CHLORIDE 0.9 % IV SOLN
Freq: Once | INTRAVENOUS | Status: AC
  Filled 2019-12-29: qty 250

## 2019-12-29 MED FILL — JAKAFI 10 MG TABLET: 10 | 30 days supply | Qty: 60 | Fill #0

## 2019-12-30 ENCOUNTER — Inpatient Hospital Stay: Payer: Medicare Other

## 2019-12-30 VITALS — BP 144/73 | HR 83 | Temp 97.9°F | Resp 16

## 2019-12-30 DIAGNOSIS — M7918 Myalgia, other site: Secondary | ICD-10-CM | POA: Diagnosis not present

## 2019-12-30 DIAGNOSIS — D45 Polycythemia vera: Secondary | ICD-10-CM

## 2019-12-30 DIAGNOSIS — D631 Anemia in chronic kidney disease: Secondary | ICD-10-CM | POA: Diagnosis not present

## 2019-12-30 DIAGNOSIS — N1832 Chronic kidney disease, stage 3b: Secondary | ICD-10-CM | POA: Diagnosis not present

## 2019-12-30 DIAGNOSIS — R32 Unspecified urinary incontinence: Secondary | ICD-10-CM | POA: Diagnosis not present

## 2019-12-30 DIAGNOSIS — R5383 Other fatigue: Secondary | ICD-10-CM | POA: Diagnosis not present

## 2019-12-30 MED ORDER — SODIUM CHLORIDE 0.9 % IV SOLN
Freq: Once | INTRAVENOUS | Status: AC
  Filled 2019-12-30: qty 250

## 2019-12-31 ENCOUNTER — Other Ambulatory Visit: Payer: Self-pay | Admitting: Hematology and Oncology

## 2019-12-31 ENCOUNTER — Telehealth: Payer: Self-pay

## 2019-12-31 ENCOUNTER — Telehealth: Payer: Self-pay | Admitting: Hematology and Oncology

## 2019-12-31 ENCOUNTER — Encounter: Payer: Self-pay | Admitting: Hematology and Oncology

## 2019-12-31 ENCOUNTER — Inpatient Hospital Stay: Payer: Medicare Other | Attending: Hematology and Oncology

## 2019-12-31 ENCOUNTER — Other Ambulatory Visit: Payer: Self-pay

## 2019-12-31 VITALS — BP 148/74 | HR 81 | Temp 97.0°F

## 2019-12-31 DIAGNOSIS — C44329 Squamous cell carcinoma of skin of other parts of face: Secondary | ICD-10-CM | POA: Diagnosis not present

## 2019-12-31 DIAGNOSIS — Z8249 Family history of ischemic heart disease and other diseases of the circulatory system: Secondary | ICD-10-CM | POA: Diagnosis not present

## 2019-12-31 DIAGNOSIS — F0390 Unspecified dementia without behavioral disturbance: Secondary | ICD-10-CM | POA: Diagnosis not present

## 2019-12-31 DIAGNOSIS — R5383 Other fatigue: Secondary | ICD-10-CM | POA: Diagnosis not present

## 2019-12-31 DIAGNOSIS — H9190 Unspecified hearing loss, unspecified ear: Secondary | ICD-10-CM | POA: Insufficient documentation

## 2019-12-31 DIAGNOSIS — B952 Enterococcus as the cause of diseases classified elsewhere: Secondary | ICD-10-CM | POA: Insufficient documentation

## 2019-12-31 DIAGNOSIS — Z79899 Other long term (current) drug therapy: Secondary | ICD-10-CM | POA: Insufficient documentation

## 2019-12-31 DIAGNOSIS — R519 Headache, unspecified: Secondary | ICD-10-CM | POA: Insufficient documentation

## 2019-12-31 DIAGNOSIS — Z818 Family history of other mental and behavioral disorders: Secondary | ICD-10-CM | POA: Insufficient documentation

## 2019-12-31 DIAGNOSIS — R251 Tremor, unspecified: Secondary | ICD-10-CM | POA: Diagnosis not present

## 2019-12-31 DIAGNOSIS — D45 Polycythemia vera: Secondary | ICD-10-CM | POA: Insufficient documentation

## 2019-12-31 DIAGNOSIS — R413 Other amnesia: Secondary | ICD-10-CM | POA: Diagnosis not present

## 2019-12-31 DIAGNOSIS — N289 Disorder of kidney and ureter, unspecified: Secondary | ICD-10-CM | POA: Diagnosis not present

## 2019-12-31 DIAGNOSIS — Z87891 Personal history of nicotine dependence: Secondary | ICD-10-CM | POA: Diagnosis not present

## 2019-12-31 DIAGNOSIS — D7581 Myelofibrosis: Secondary | ICD-10-CM | POA: Diagnosis not present

## 2019-12-31 DIAGNOSIS — D649 Anemia, unspecified: Secondary | ICD-10-CM | POA: Diagnosis not present

## 2019-12-31 DIAGNOSIS — Z803 Family history of malignant neoplasm of breast: Secondary | ICD-10-CM | POA: Insufficient documentation

## 2019-12-31 LAB — CARBAPENEM RESISTANCE PANEL
Carba Resistance IMP Gene: NOT DETECTED
Carba Resistance KPC Gene: NOT DETECTED
Carba Resistance NDM Gene: NOT DETECTED
Carba Resistance OXA48 Gene: NOT DETECTED
Carba Resistance VIM Gene: NOT DETECTED

## 2019-12-31 LAB — URINE CULTURE: Culture: >=100000 — AB

## 2019-12-31 MED ORDER — SULFAMETHOXAZOLE-TRIMETHOPRIM 400-80 MG PO TABS: 1.0000 | ORAL_TABLET | Freq: Two times a day (BID) | ORAL | 0 refills | Status: DC

## 2019-12-31 MED ORDER — SODIUM CHLORIDE 0.9 % IV SOLN
Freq: Once | INTRAVENOUS | Status: AC
  Filled 2019-12-31: qty 250

## 2019-12-31 NOTE — Telephone Encounter (Signed)
Spoke with Laura Wilcox to inform her Per Dr Mike Gip she will send the Bactrim to the pharmacy now for her UTI. Ms Laura Wilcox was understanding and agreeable.

## 2019-12-31 NOTE — Telephone Encounter (Signed)
Left a message / I tried to raech Ms Carlena Sax about her mother UTI to see which antibotics has he r mother had in the past ( 2 choices were Cipro and bactrim. I will try to call back in the next 30 min.

## 2019-12-31 NOTE — Telephone Encounter (Signed)
Re:  UTI  Patient has UTI symptoms.  Urine culture grew > 100,000 colonies of enterobacter cloacae.  Sensitivities to ciprofloxacin, Septra, gentamicin, and imipenem.  Intermediate to nitrofurantoin.  Attempted to discuss with patient's daughter, [name], multiple times.  Vito Berger, CMA stated that family did not want her to receive ciprofloxacin.  Dose adjusted per renal function.  A Rx for Septra SS 1 BID x 7 days electronically sent to her pharmacy.   Lequita Asal, MD

## 2020-01-02 DIAGNOSIS — N390 Urinary tract infection, site not specified: Secondary | ICD-10-CM | POA: Insufficient documentation

## 2020-01-02 HISTORY — DX: Urinary tract infection, site not specified: N39.0

## 2020-01-03 NOTE — Progress Notes (Signed)
[facility]  [address], [address] [place], [place] [address] Phone: [phone]  Fax: [phone]   Clinic Day:  01/04/2020   Referring physician: Lequita Asal, MD  Chief Complaint: Laura Wilcox is a 85 y.o. female with polycythemia rubra vera (PV)on [name] who is seen for 1 week assessment.   HPI: The patient was last seen in the hematology clinic on 12/27/2019. At that time, she was more alert.  Memory was poor.  She was drinking little.  Energy level improved after fluids.  Her daughter was concerned about a UTI.  Jakafi remained held.  Labs at last visit included a hematocrit of 27.6, hemoglobin 8.4, MCV 96.5, platelets 514,000, WBC 10,300. Creatinine was 1.14 (CrCl 42 ml/min). Urinalysis revealed positive nitrate, moderate leukocytes, and many bacteria. Urine culture revealed > 100,000 colonies/ml of Enterobacter cloacae. Carbapenem resistance panel was negative.  Sensitivities included ciprofloxacin and trimethaprim sulfamethoxazole.  She received IVF daily from 12/27/2019 - 12/31/2019.  During the interim, she has been "great."  Her oxygen saturation was 76% on arrival. On repeat, it was 86% then 95%.  Her hands were cold and the monitor was not tracking.  She denies dizziness, lightheadedness, and trouble breathing. She started taking Bactrim 4 days ago.  The patient is drinking more water at home. She drank 32 oz of water yesterday. Her appetite has been decreased. Yesterday, she ate a quarter of a sandwich for lunch.  Her daughter states that the patient has been spending most of her time in bed. She wanted to cancel her appointment this morning because she was too tired. The patient states that she feels "great."  The patient had the spot on her cheek biopsied about a year ago and it showed squamous cell carcinoma. While in the office, she had an episode of whole body shaking due to the facial pain; she is reluctant to go back. She was  referred for a Moh's surgery but never followed up.  The patient is adamant about not seeing a neurologist.   Past Medical History:  Diagnosis Date  . Polycythemia     Past Surgical History:  Procedure Laterality Date  . ABDOMINAL HYSTERECTOMY     complete  . BREAST SURGERY      Family History  Problem Relation Age of Onset  . Heart disease Father   . Cancer Sister        breast  . Cancer Sister        breast  . Alzheimer's disease Sister     Social History:  reports that she quit smoking about 41 years ago. She has a 30.00 pack-year smoking history. She has never used smokeless tobacco. She reports that she does not drink alcohol and does not use drugs. She is widowed. Patient is retired Sport and exercise psychologist. Patient moved to [facility] in 03/2017. She lives independently in her own apartment in [place]. Her daughter lives next door.Patient denies known exposures to radiation on toxins.Pt's daughter, [name] be reached at [phone]. She lives in [place].  The patient is accompanied by her daughter today.   Allergies: No Known Allergies  Current Medications: Current Outpatient Medications  Medication Sig Dispense Refill  . sulfamethoxazole-trimethoprim (BACTRIM) 400-80 MG tablet Take 1 tablet by mouth 2 (two) times daily. 14 tablet 0  . amoxicillin-clavulanate (AUGMENTIN) 875-125 MG tablet  (Patient not taking: Reported on 12/27/2019)    . calcitRIOL (ROCALTROL) 0.25 MCG capsule Take 0.25 mcg by mouth 3 (three) times a week. (Patient not taking: Reported on  12/27/2019)    . JAKAFI 10 MG tablet TAKE 1 TABLET (10 MG TOTAL) BY MOUTH 2 TIMES DAILY. (Patient not taking: Reported on 12/27/2019) 60 tablet 1  . JAKAFI 5 MG tablet TAKE 1 TABLET (5 MG TOTAL) BY MOUTH 2 (TWO) TIMES DAILY. (Patient not taking: Reported on 06/22/2019) 60 tablet 0   No current facility-administered medications for this visit.    Review of Systems  Constitutional: Positive for malaise/fatigue (in bed 15-20 hrs/day).  Negative for chills, diaphoresis, fever and weight loss (up 1 lb).       Feels "great."  HENT: Positive for hearing loss. Negative for congestion, ear discharge, ear pain, nosebleeds, sinus pain, sore throat and tinnitus.   Eyes: Negative.  Negative for blurred vision.  Respiratory: Negative.  Negative for cough, hemoptysis, sputum production and shortness of breath.   Cardiovascular: Negative.  Negative for chest pain, palpitations and leg swelling.  Gastrointestinal: Negative for abdominal pain, blood in stool, constipation, diarrhea, heartburn, melena, nausea and vomiting.       Drinking more water at home. Decreased appetite.  Genitourinary: Negative.  Negative for dysuria, frequency, hematuria and urgency.  Musculoskeletal: Negative.  Negative for back pain, joint pain, myalgias and neck pain.  Skin: Negative.  Negative for itching and rash.  Neurological: Negative.  Negative for dizziness, tingling, sensory change, weakness and headaches.  Endo/Heme/Allergies: Negative.  Does not bruise/bleed easily.  Psychiatric/Behavioral: Positive for memory loss (worsened). Negative for depression. The patient is not nervous/anxious and does not have insomnia.   All other systems reviewed and are negative.  Performance status (ECOG): 2-3  Vitals Blood pressure (!) 120/56, pulse 82, temperature 98 F (36.7 C), resp. rate 20, weight 112 lb 3.4 oz (50.9 kg), SpO2 (!) 76 %.   Physical Exam Vitals and nursing note reviewed.  Constitutional:      General: She is not in acute distress.    Appearance: She is not diaphoretic.     Interventions: Face mask in place.  HENT:     Head: Normocephalic and atraumatic.     Comments: Short dark blonde hair.    Mouth/Throat:     Mouth: Mucous membranes are moist.     Pharynx: Oropharynx is clear.     Comments: Lips are pink. Eyes:     General: No scleral icterus.    Extraocular Movements: Extraocular movements intact.     Conjunctiva/sclera: Conjunctivae  normal.     Pupils: Pupils are equal, round, and reactive to light.     Comments: Blue eyes.  Cardiovascular:     Rate and Rhythm: Normal rate and regular rhythm.     Pulses: Normal pulses.     Heart sounds: Normal heart sounds. No murmur heard.   Pulmonary:     Effort: Pulmonary effort is normal. No respiratory distress.     Breath sounds: Normal breath sounds. No wheezing or rales.  Chest:     Chest wall: No tenderness.  Abdominal:     General: Bowel sounds are normal. There is no distension.     Palpations: Abdomen is soft. There is no hepatomegaly, splenomegaly or mass.     Tenderness: There is no abdominal tenderness. There is no guarding or rebound.  Musculoskeletal:        General: No swelling or tenderness. Normal range of motion.     Cervical back: Normal range of motion and neck supple.  Lymphadenopathy:     Head:     Right side of head: No preauricular, posterior  auricular or occipital adenopathy.     Left side of head: No preauricular, posterior auricular or occipital adenopathy.     Cervical: No cervical adenopathy.     Upper Body:     Right upper body: No supraclavicular or axillary adenopathy.     Left upper body: No supraclavicular or axillary adenopathy.     Lower Body: No right inguinal adenopathy. No left inguinal adenopathy.  Skin:    General: Skin is warm and dry.     Comments: Hands are cold. Spot on left cheek, previously biopsied, 34 mm x 18 mm, see photo. Smaller spot on left neck.  Neurological:     Mental Status: She is alert and oriented to person, place, and time. Mental status is at baseline.  Psychiatric:        Mood and Affect: Mood normal.        Thought Content: Thought content normal.      01/04/2020    Appointment on 01/04/2020  Component Date Value Ref Range Status  . WBC 01/04/2020 8.8  4.0 - 10.5 K/uL Final  . RBC 01/04/2020 2.88* 3.87 - 5.11 MIL/uL Final  . Hemoglobin 01/04/2020 8.5* 12.0 - 15.0 g/dL Final  . HCT 01/04/2020 28.3*  36 - 46 % Final  . MCV 01/04/2020 98.3  80.0 - 100.0 fL Final  . MCH 01/04/2020 29.5  26.0 - 34.0 pg Final  . MCHC 01/04/2020 30.0  30.0 - 36.0 g/dL Final  . RDW 01/04/2020 24.7* 11.5 - 15.5 % Final  . Platelets 01/04/2020 451* 150 - 400 K/uL Final  . nRBC 01/04/2020 0.5* 0.0 - 0.2 % Final  . Neutrophils Relative % 01/04/2020 66  % Final  . Neutro Abs 01/04/2020 5.8  1.7 - 7.7 K/uL Final  . Lymphocytes Relative 01/04/2020 18  % Final  . Lymphs Abs 01/04/2020 1.6  0.7 - 4.0 K/uL Final  . Monocytes Relative 01/04/2020 7  % Final  . Monocytes Absolute 01/04/2020 0.6  0 - 1 K/uL Final  . Eosinophils Relative 01/04/2020 6  % Final  . Eosinophils Absolute 01/04/2020 0.5  0 - 0 K/uL Final  . Basophils Relative 01/04/2020 1  % Final  . Basophils Absolute 01/04/2020 0.1  0 - 0 K/uL Final  . Immature Granulocytes 01/04/2020 2  % Final  . Abs Immature Granulocytes 01/04/2020 0.20* 0.00 - 0.07 K/uL Final   Performed at [facility], [address], [place], [place] [address]  . Sodium 01/04/2020 137  135 - 145 mmol/L Final  . Potassium 01/04/2020 3.8  3.5 - 5.1 mmol/L Final  . Chloride 01/04/2020 102  98 - 111 mmol/L Final  . CO2 01/04/2020 24  22 - 32 mmol/L Final  . Glucose, Bld 01/04/2020 130* 70 - 99 mg/dL Final   Glucose reference range applies only to samples taken after fasting for at least 8 hours.  . BUN 01/04/2020 18  8 - 23 mg/dL Final  . Creatinine, Ser 01/04/2020 1.32* 0.44 - 1.00 mg/dL Final  . Calcium 01/04/2020 8.6* 8.9 - 10.3 mg/dL Final  . GFR calc non Af Amer 01/04/2020 35* >60 mL/min Final  . Anion gap 01/04/2020 11  5 - 15 Final   Performed at [facility], [address] [place], [place] [address]    Assessment:  Laura Wilcox is a 85 y.o. female with polycythemia rubra veraand secondary myelofibrosis. She presented in 04/2002 with thrombocytosis (835,000). JAK2 V617Fwas positive on 05/20/2012.  Over the past year,hematocrithas  ranged  37.5 - 42.4,hemoglobin12.1 - 13.4, platelets458,000 - 569,000, WBC10,500 - 15,700. Creatinine has been 1.0 - 1.2.  Bone marrowon 04/15/2002 revealed a normocellular marrow for age with trilineage hematopoiesis and mild megakaryocytic hyperplasia with focal atypia. There was no morphologic evidence of a myeloproliferative disorder. Iron stains were inadequate for evaluation of storage iron. There was no increase in reticulin fibers. She has been on agrylin since 2012.  Bone marrowon 09/25/2012 revealed a persistent myeloproliferative disorder s/p Agrylin. The current features were best classified as persistent myeloproliferative neoplasm (MPN) JAK2 V617F mutation positive. Marrow was hypercellular for age (30-50%) with increased trilineage hematopoiesis and markedly increased atypical megakaryopoiesis with frequent clustering. There were no increased blasts. Iron stores were absent. There was moderate to severe myelofibrosis(grade MF 2-3). Differential included ET, PV, primary myelofibrosis, and MPN, unclassifiable. Cytogeneticswere normal (57, XX).  She developed pancytopeniain 12/2015. Etiology is unclear (medication confusion or hydroxyurea + Agrylin).  She received Procrit40,000 units (intermittently 09/2012 - 06/2016), Venofer(09/2012 - 10/2012), and Infed(12/2015 - 03/2016). She received Neupogenduring a period of neutropenia. She was anagrelidefrom 2004 - 08/13/2017. She beganJakafion 08/14/2017. Jakafi was increased from 5 mg BID to 10 mg BID on03/18/2020.Jakafi was increased to 15 mg BID on 08/03/2018.She has been back on Jakafi 5 mg BID since 02/01/2019 then 10 mg BID on 05/25/2019.  [name] has been on hold since 12/20/2019.  She began a phlebotomy programon 08/06/2017. She undergoes small volume (150 cc with replacement of 150 cc NS)if her HCT >42.  Ferritinhas been followed: 529 on 09/03/2017, 1020 on 12/03/2017, 423 on 02/18/2018, 858 on  11/17/2018, 785 on 09/23/2019 and 728 on 12/20/2019.Iron saturationwas 37%and TIBC 270 on08/18/2020.B12 was 477 and folate 36 on 11/17/2018.  B12 was 292 and folate 19.1 on 12/20/2019  She was admitted to [facility] 12/17/2018 - 12/18/2018. She received 1 unit of PRBCs.CBC at discharge included a hematocrit of 29.0, hemoglobin 9.4, platelets 364,000, and WBC 10,200.  She received a course of Keflexfor a Proteus mirabilisUTIbeginning 01/11/2019.  The patient has received both doses of the COVID-19 vaccine.  Symptomatically, she feels "great".  She is spending the majority of day in bed.  She is drinking more fluids.  She has a growing squamous cell carcinoma on her cheek.  Plan: 1.   Labs today: CBC with diff, BMP. 2.Polycythemia rubra vera             Hematocrit32.2.Hemoglobin 9.7.Platelets448,000 on 09/23/2019.             Hematocrit 29.6.  Hemoglobin  9.2.  Platelets 521,000 on 11/29/2019.             Hematocrit 27.3.  Hemoglobin  8.5.  Platelets 562,000 on 12/20/2019.  Hematocrit 27.6.  Hemoglobin  8.4.  Platelets 514,000 on 12/27/2019. Hematocrit 28.3.  Hemoglobin  8.5.  Platelets 451,000 on 01/04/2020. Platelet count goal < 400,000. [name] has been on hold since 12/20/2019 Continue to hold Jakafi at present time. 3.Renal insufficiency BUN28and creatinine1.15 on 12/20/2019. BUN 15 and creatinine 1.14 on 12/27/2019. BUN 24 and creatinine 1.32 on 01/04/2020. Patient has been receiving IV fluids Monday through Friday.   She has recently had 32 ounces in 1 day (960 cc a day).   Patient minimizes water intake secondary to incontinence issues. Trial of decreased IVFs this week. 4.Anemia             Hematocrit 28.3 and hemoglobin 8.5.              Ferritin 728 with an iron saturation  of 26% and a TIBC 223 on 12/20/2019.              B12 was 292 with a folate of 19.1 on 12/20/2019.              B12 goal is 400.             Etiology felt secondary to  [facility].   Jakafi remains on hold.             Patient received Retacrit on 12/24/2019.  Continue Retacrit every 2 weeks per protocol. 5.   Dementia             Patient appears to have underlying dementia.             Patient continues to adamantly declined neurologic evaluation. 6.   Enterobacter cloacae UTI  Urine culture on 12/27/2019 revealed > 100,000 colonies/ml of Enterobacter cloacae.   Patient received an Rx for Septra on 12/31/2019.  Do not feel UTI contributing to mentation. 7.   Squamous cell carcinoma left cheek  Left cheek lesion is expanding.  Encourage follow-up with dermatology for planned surgery. 8.   IVF 125 cc/hr x 4 hours today and Thursday. 9.   RTC in 1 week for MD assessment, labs (CBC with diff, BMP), and +/- IVF.  I discussed the assessment and treatment plan with the patient.  The patient was provided an opportunity to ask questions and all were answered.  The patient agreed with the plan and demonstrated an understanding of the instructions.  The patient was advised to call back if the symptoms worsen or if the condition fails to improve as anticipated.  I provided 22 minutes of face-to-face time during this this encounter and > 50% was spent counseling as documented under my assessment and plan.  An additional 6 minutes were spent reviewing her chart (Epic and Care Everywhere) including notes, labs, and imaging studies.    Lequita Asal, MD, PhD    01/04/2020, 10:51 AM   I, Mirian Mo Tufford, am acting as Education administrator for [employer]. Mike Gip, MD, PhD.  I, Karrine Kluttz C. Mike Gip, MD, have reviewed the above documentation for accuracy and completeness, and I agree with the above.

## 2020-01-04 ENCOUNTER — Inpatient Hospital Stay: Payer: Medicare Other

## 2020-01-04 ENCOUNTER — Inpatient Hospital Stay (HOSPITAL_BASED_OUTPATIENT_CLINIC_OR_DEPARTMENT_OTHER): Payer: Medicare Other | Admitting: Hematology and Oncology

## 2020-01-04 ENCOUNTER — Encounter: Payer: Self-pay | Admitting: Hematology and Oncology

## 2020-01-04 ENCOUNTER — Other Ambulatory Visit: Payer: Self-pay

## 2020-01-04 VITALS — BP 120/56 | HR 82 | Temp 98.0°F | Resp 20 | Wt 112.2 lb

## 2020-01-04 DIAGNOSIS — D45 Polycythemia vera: Secondary | ICD-10-CM

## 2020-01-04 DIAGNOSIS — Z7189 Other specified counseling: Secondary | ICD-10-CM

## 2020-01-04 DIAGNOSIS — R519 Headache, unspecified: Secondary | ICD-10-CM | POA: Diagnosis not present

## 2020-01-04 DIAGNOSIS — C44329 Squamous cell carcinoma of skin of other parts of face: Secondary | ICD-10-CM | POA: Diagnosis not present

## 2020-01-04 DIAGNOSIS — R251 Tremor, unspecified: Secondary | ICD-10-CM | POA: Diagnosis not present

## 2020-01-04 DIAGNOSIS — N390 Urinary tract infection, site not specified: Secondary | ICD-10-CM

## 2020-01-04 DIAGNOSIS — B952 Enterococcus as the cause of diseases classified elsewhere: Secondary | ICD-10-CM | POA: Diagnosis not present

## 2020-01-04 DIAGNOSIS — N289 Disorder of kidney and ureter, unspecified: Secondary | ICD-10-CM | POA: Diagnosis not present

## 2020-01-04 DIAGNOSIS — H9190 Unspecified hearing loss, unspecified ear: Secondary | ICD-10-CM | POA: Diagnosis not present

## 2020-01-04 DIAGNOSIS — D649 Anemia, unspecified: Secondary | ICD-10-CM

## 2020-01-04 LAB — BASIC METABOLIC PANEL
Anion gap: 11 (ref 5–15)
BUN: 18 mg/dL (ref 8–23)
CO2: 24 mmol/L (ref 22–32)
Calcium: 8.6 mg/dL — ABNORMAL LOW (ref 8.9–10.3)
Chloride: 102 mmol/L (ref 98–111)
Creatinine, Ser: 1.32 mg/dL — ABNORMAL HIGH (ref 0.44–1.00)
GFR calc non Af Amer: 35 mL/min — ABNORMAL LOW (ref >60)
Glucose, Bld: 130 mg/dL — ABNORMAL HIGH (ref 70–99)
Potassium: 3.8 mmol/L (ref 3.5–5.1)
Sodium: 137 mmol/L (ref 135–145)

## 2020-01-04 LAB — CBC WITH DIFFERENTIAL/PLATELET
Abs Immature Granulocytes: 0.2 10*3/uL — ABNORMAL HIGH (ref 0.00–0.07)
Basophils Absolute: 0.1 10*3/uL (ref 0.0–0.1)
Basophils Relative: 1 %
Eosinophils Absolute: 0.5 10*3/uL (ref 0.0–0.5)
Eosinophils Relative: 6 %
HCT: 28.3 % — ABNORMAL LOW (ref 36.0–46.0)
Hemoglobin: 8.5 g/dL — ABNORMAL LOW (ref 12.0–15.0)
Immature Granulocytes: 2 %
Lymphocytes Relative: 18 %
Lymphs Abs: 1.6 10*3/uL (ref 0.7–4.0)
MCH: 29.5 pg (ref 26.0–34.0)
MCHC: 30 g/dL (ref 30.0–36.0)
MCV: 98.3 fL (ref 80.0–100.0)
Monocytes Absolute: 0.6 10*3/uL (ref 0.1–1.0)
Monocytes Relative: 7 %
Neutro Abs: 5.8 10*3/uL (ref 1.7–7.7)
Neutrophils Relative %: 66 %
Platelets: 451 10*3/uL — ABNORMAL HIGH (ref 150–400)
RBC: 2.88 MIL/uL — ABNORMAL LOW (ref 3.87–5.11)
RDW: 24.7 % — ABNORMAL HIGH (ref 11.5–15.5)
WBC: 8.8 10*3/uL (ref 4.0–10.5)
nRBC: 0.5 % — ABNORMAL HIGH (ref 0.0–0.2)

## 2020-01-04 MED ORDER — SODIUM CHLORIDE 0.9 % IV SOLN
Freq: Once | INTRAVENOUS | Status: AC
  Filled 2020-01-04: qty 250

## 2020-01-04 NOTE — Progress Notes (Signed)
Performed orthostatic vitals Sitting:120/56 HR 78 Standing: 121/64 HR 84

## 2020-01-06 ENCOUNTER — Other Ambulatory Visit: Payer: Self-pay

## 2020-01-06 ENCOUNTER — Ambulatory Visit: Payer: Medicare Other

## 2020-01-06 ENCOUNTER — Inpatient Hospital Stay: Payer: Medicare Other

## 2020-01-06 DIAGNOSIS — B952 Enterococcus as the cause of diseases classified elsewhere: Secondary | ICD-10-CM | POA: Diagnosis not present

## 2020-01-06 DIAGNOSIS — R519 Headache, unspecified: Secondary | ICD-10-CM | POA: Diagnosis not present

## 2020-01-06 DIAGNOSIS — R251 Tremor, unspecified: Secondary | ICD-10-CM | POA: Diagnosis not present

## 2020-01-06 DIAGNOSIS — H9190 Unspecified hearing loss, unspecified ear: Secondary | ICD-10-CM | POA: Diagnosis not present

## 2020-01-06 DIAGNOSIS — D45 Polycythemia vera: Secondary | ICD-10-CM | POA: Diagnosis not present

## 2020-01-06 DIAGNOSIS — C44329 Squamous cell carcinoma of skin of other parts of face: Secondary | ICD-10-CM | POA: Diagnosis not present

## 2020-01-06 MED ORDER — SODIUM CHLORIDE 0.9 % IV SOLN
Freq: Once | INTRAVENOUS | Status: AC
  Filled 2020-01-06: qty 250

## 2020-01-11 NOTE — Progress Notes (Signed)
[facility]  [address] [place], [place] [address] Phone: [phone]  Fax: [phone]   Clinic Day:  01/12/2020   Referring physician: No ref. provider found  Chief Complaint: Laura Wilcox is a 86 y.o. female with polycythemia rubra vera (PV)on Jakafi who is seen for 1 week assessment.   HPI: The patient was last seen in the hematology clinic on 12/27/2019. At that time, she felt "great".  She was trying to drink more water.  She was on Bactrim for a Enterobacter cloacae UTI.  Hematocrit was 28.3, hemoglobin 8.5, MCV 98.3, platelets 451,000, and WBC 8,800. Creatinine was 1.32 (CrCl 35 ml/min). Calcium was 8.6.  We discussed decreasing her fluids to twice a week.  We recommended follow-up with dermatology for her right cheek squamous cell carcinoma.  She declined follow-up with neurology.  She received IVF on 01/04/2020 and 01/06/2020.  During the interim, she has been okay. She states she is going great, but her daughter states that she is still fatigued. She sleeps at least 20 hours per day and usually only gets up to eat. She has been drinking a lot more water at home. She denies fevers and chills.  She finished her antibiotics on 01/07/2020. She has a full bottle (60 tablets) of Jakafi 10 mg at home. She has a few 5 mg pills at home and will start taking it twice daily.  She has not been to a dermatologist for the spot on her left cheek. Her daughter will set this up. The patient again refuses to see neurology and states that she "wants nothing to do with it."   Past Medical History:  Diagnosis Date  . Polycythemia     Past Surgical History:  Procedure Laterality Date  . ABDOMINAL HYSTERECTOMY     complete  . BREAST SURGERY      Family History  Problem Relation Age of Onset  . Heart disease Father   . Cancer Sister        breast  . Cancer Sister        breast  . Alzheimer's disease Sister     Social History:  reports that  she quit smoking about 41 years ago. She has a 30.00 pack-year smoking history. She has never used smokeless tobacco. She reports that she does not drink alcohol and does not use drugs. She is widowed. Patient is retired Sport and exercise psychologist. Patient moved to [facility] in 03/2017. She lives independently in her own apartment in [place]. Her daughter lives next door.Patient denies known exposures to radiation on toxins.Pt's daughter, [name] be reached at [phone]. She lives in [place].  The patient is accompanied by her daughter today.   Allergies: No Known Allergies  Current Medications: Current Outpatient Medications  Medication Sig Dispense Refill  . amoxicillin-clavulanate (AUGMENTIN) 875-125 MG tablet  (Patient not taking: Reported on 12/27/2019)    . calcitRIOL (ROCALTROL) 0.25 MCG capsule Take 0.25 mcg by mouth 3 (three) times a week. (Patient not taking: Reported on 12/27/2019)    . JAKAFI 10 MG tablet TAKE 1 TABLET (10 MG TOTAL) BY MOUTH 2 TIMES DAILY. (Patient not taking: Reported on 12/27/2019) 60 tablet 1  . JAKAFI 5 MG tablet TAKE 1 TABLET (5 MG TOTAL) BY MOUTH 2 (TWO) TIMES DAILY. (Patient not taking: Reported on 06/22/2019) 60 tablet 0  . sulfamethoxazole-trimethoprim (BACTRIM) 400-80 MG tablet Take 1 tablet by mouth 2 (two) times daily. 14 tablet 0   No current facility-administered medications for this visit.  Review of Systems  Constitutional: Positive for malaise/fatigue (in bed 20+ hrs/day) and weight loss (1 lb). Negative for chills, diaphoresis and fever.       Patient feels "good".  HENT: Positive for hearing loss. Negative for congestion, ear discharge, ear pain, nosebleeds, sinus pain, sore throat and tinnitus.   Eyes: Negative.  Negative for blurred vision.  Respiratory: Negative.  Negative for cough, hemoptysis, sputum production and shortness of breath.   Cardiovascular: Negative.  Negative for chest pain, palpitations and leg swelling.  Gastrointestinal: Negative for abdominal  pain, blood in stool, constipation, diarrhea, heartburn, melena, nausea and vomiting.       Drinking more water at home.  Genitourinary: Negative.  Negative for dysuria, frequency, hematuria and urgency.  Musculoskeletal: Negative.  Negative for back pain, joint pain, myalgias and neck pain.  Skin: Negative.  Negative for itching and rash.  Neurological: Negative.  Negative for dizziness, tingling, sensory change, weakness and headaches.  Endo/Heme/Allergies: Negative.  Does not bruise/bleed easily.  Psychiatric/Behavioral: Positive for memory loss. Negative for depression. The patient is not nervous/anxious and does not have insomnia.   All other systems reviewed and are negative.  Performance status (ECOG): 3  Vitals There were no vitals taken for this visit.   Physical Exam Vitals and nursing note reviewed.  Constitutional:      General: She is not in acute distress.    Appearance: She is not diaphoretic.     Interventions: Face mask in place.     Comments: Requires assistance onto exam table.  HENT:     Head: Normocephalic and atraumatic.     Comments: Short dark blonde hair.    Mouth/Throat:     Mouth: Mucous membranes are moist.     Pharynx: Oropharynx is clear.  Eyes:     General: No scleral icterus.    Extraocular Movements: Extraocular movements intact.     Conjunctiva/sclera: Conjunctivae normal.     Pupils: Pupils are equal, round, and reactive to light.     Comments: Blue eyes  Cardiovascular:     Rate and Rhythm: Normal rate and regular rhythm.     Pulses: Normal pulses.     Heart sounds: Normal heart sounds. No murmur heard.   Pulmonary:     Effort: Pulmonary effort is normal. No respiratory distress.     Breath sounds: Normal breath sounds. No wheezing or rales.  Chest:     Chest wall: No tenderness.  Abdominal:     General: Bowel sounds are normal. There is no distension.     Palpations: Abdomen is soft. There is no hepatomegaly, splenomegaly or mass.      Tenderness: There is no abdominal tenderness. There is no guarding or rebound.  Musculoskeletal:        General: No swelling or tenderness. Normal range of motion.     Cervical back: Normal range of motion and neck supple.  Lymphadenopathy:     Head:     Right side of head: No preauricular, posterior auricular or occipital adenopathy.     Left side of head: No preauricular, posterior auricular or occipital adenopathy.     Cervical: No cervical adenopathy.     Upper Body:     Right upper body: No supraclavicular or axillary adenopathy.     Left upper body: No supraclavicular or axillary adenopathy.     Lower Body: No right inguinal adenopathy. No left inguinal adenopathy.  Skin:    General: Skin is warm and dry.  Comments: Spot on left cheek, previously biopsied, 34 mm x 18 mm, see photo.  Neurological:     Mental Status: She is alert and oriented to person, place, and time. Mental status is at baseline.     Comments: Unable to remember 3 words after 10 minutes.  Psychiatric:        Mood and Affect: Mood normal.        Thought Content: Thought content normal.      01/04/2020     No visits with results within 3 Day(s) from this visit.  Latest known visit with results is:  Appointment on 01/04/2020  Component Date Value Ref Range Status  . WBC 01/04/2020 8.8  4.0 - 10.5 K/uL Final  . RBC 01/04/2020 2.88* 3.87 - 5.11 MIL/uL Final  . Hemoglobin 01/04/2020 8.5* 12.0 - 15.0 g/dL Final  . HCT 01/04/2020 28.3* 36 - 46 % Final  . MCV 01/04/2020 98.3  80.0 - 100.0 fL Final  . MCH 01/04/2020 29.5  26.0 - 34.0 pg Final  . MCHC 01/04/2020 30.0  30.0 - 36.0 g/dL Final  . RDW 01/04/2020 24.7* 11.5 - 15.5 % Final  . Platelets 01/04/2020 451* 150 - 400 K/uL Final  . nRBC 01/04/2020 0.5* 0.0 - 0.2 % Final  . Neutrophils Relative % 01/04/2020 66  % Final  . Neutro Abs 01/04/2020 5.8  1.7 - 7.7 K/uL Final  . Lymphocytes Relative 01/04/2020 18  % Final  . Lymphs Abs 01/04/2020 1.6  0.7 -  4.0 K/uL Final  . Monocytes Relative 01/04/2020 7  % Final  . Monocytes Absolute 01/04/2020 0.6  0.1 - 1.0 K/uL Final  . Eosinophils Relative 01/04/2020 6  % Final  . Eosinophils Absolute 01/04/2020 0.5  0 - 0 K/uL Final  . Basophils Relative 01/04/2020 1  % Final  . Basophils Absolute 01/04/2020 0.1  0 - 0 K/uL Final  . Immature Granulocytes 01/04/2020 2  % Final  . Abs Immature Granulocytes 01/04/2020 0.20* 0.00 - 0.07 K/uL Final   Performed at [facility], [address] [place], [place] [address]  . Sodium 01/04/2020 137  135 - 145 mmol/L Final  . Potassium 01/04/2020 3.8  3.5 - 5.1 mmol/L Final  . Chloride 01/04/2020 102  98 - 111 mmol/L Final  . CO2 01/04/2020 24  22 - 32 mmol/L Final  . Glucose, Bld 01/04/2020 130* 70 - 99 mg/dL Final   Glucose reference range applies only to samples taken after fasting for at least 8 hours.  . BUN 01/04/2020 18  8 - 23 mg/dL Final  . Creatinine, Ser 01/04/2020 1.32* 0.44 - 1.00 mg/dL Final  . Calcium 01/04/2020 8.6* 8.9 - 10.3 mg/dL Final  . GFR calc non Af Amer 01/04/2020 35* >60 mL/min Final  . Anion gap 01/04/2020 11  5 - 15 Final   Performed at [facility], [address] [place], [place] [address]    Assessment:  Saralynn Langhorst is a 86 y.o. female with polycythemia rubra veraand secondary myelofibrosis. She presented in 04/2002 with thrombocytosis (835,000). JAK2 V617Fwas positive on 05/20/2012.  Over the past year,hematocrithas ranged 37.5 - 42.4,hemoglobin12.1 - 13.4, platelets458,000 - 569,000, WBC10,500 - 15,700. Creatinine has been 1.0 - 1.2.  Bone marrowon 04/15/2002 revealed a normocellular marrow for age with trilineage hematopoiesis and mild megakaryocytic hyperplasia with focal atypia. There was no morphologic evidence of a myeloproliferative disorder. Iron stains were inadequate for evaluation of storage iron. There was no increase in reticulin  fibers. She has been on agrylin since  2012.  Bone marrowon 09/25/2012 revealed a persistent myeloproliferative disorder s/p Agrylin. The current features were best classified as persistent myeloproliferative neoplasm (MPN) JAK2 V617F mutation positive. Marrow was hypercellular for age (30-50%) with increased trilineage hematopoiesis and markedly increased atypical megakaryopoiesis with frequent clustering. There were no increased blasts. Iron stores were absent. There was moderate to severe myelofibrosis(grade MF 2-3). Differential included ET, PV, primary myelofibrosis, and MPN, unclassifiable. Cytogeneticswere normal (50, XX).  She developed pancytopeniain 12/2015. Etiology is unclear (medication confusion or hydroxyurea + Agrylin).  She received Procrit40,000 units (intermittently 09/2012 - 06/2016), Venofer(09/2012 - 10/2012), and Infed(12/2015 - 03/2016). She received Neupogenduring a period of neutropenia. She was anagrelidefrom 2004 - 08/13/2017. She beganJakafion 08/14/2017. Jakafi was increased from 5 mg BID to 10 mg BID on03/18/2020.Jakafi was increased to 15 mg BID on 08/03/2018.She has been back on Jakafi 5 mg BID since 02/01/2019 then 10 mg BID on 05/25/2019.  [name] has been on hold since 12/20/2019.  She began a phlebotomy programon 08/06/2017. She undergoes small volume (150 cc with replacement of 150 cc NS)if her HCT >42.  Ferritinhas been followed: 529 on 09/03/2017, 1020 on 12/03/2017, 423 on 02/18/2018, 858 on 11/17/2018, 785 on 09/23/2019 and 728 on 12/20/2019.Iron saturationwas 37%and TIBC 270 on08/18/2020.B12 was 477 and folate 36 on 11/17/2018.  B12 was 292 and folate 19.1 on 12/20/2019  She was admitted to [facility] 12/17/2018 - 12/18/2018. She received 1 unit of PRBCs.CBC at discharge included a hematocrit of 29.0, hemoglobin 9.4, platelets 364,000, and WBC 10,200.  She received a course of Keflexfor a Proteus mirabilisUTIbeginning 01/11/2019.  Urine culture on  12/27/2019 revealed greater than 100,000 colonies of Enterobacter cloacae.  She was treated with Septra secondary to sensitivities.  The patient has received both doses of the COVID-19 vaccine.  Symptomatically, she states things are "going great"; her daughter states that she is still fatigued. She sleeps at least 20 hours per day and usually only gets up to eat. She has been drinking a lot more water at home. She finished her antibiotics on 01/07/2020. She has not seen the dermatologist for the spot on her left cheek.  Plan: 1.   Labs today: CBC with diff, BMP. 2.Polycythemia rubra vera  Hematocrit 27.6.  Hemoglobin  8.4.  Platelets 514,000 on 12/27/2019.  Hematocrit28.3.Hemoglobin 8.5.Platelets451,000 on 01/04/2020.             Hematocrit 31.6.  Hemoglobin  9.3.  Platelets 620,000 on 01/12/2020. Platelet count goal < 400,000. [name] has been on hold since 12/20/2019 Discuss restarting Jakafi at 5 mg p.o. twice daily. 3.Renal insufficiency BUN28and creatinine1.15 on 12/20/2019. BUN 15 and creatinine 1.14 on 12/27/2019. BUN 18 and creatinine 1.32 on 01/04/2020. BUN 20 and creatinine 1.14 on 01/12/2020. Patient is currently drinking more fluids.   Discuss consideration of increasing interval between appointments.  Her daughter would like her to be seen in 1 week. Trial of no IVF this week. 4.Anemia             Etiology felt secondary to [facility] [place].   Jakafi currently on hold.             Ferritin 728 with an iron saturation of 26% and a TIBC 223 on 12/20/2019.             B12 was 292 with a folate of 19.1 on 12/20/2019.              B12 goal is 400.  Patient received Retacrit on 12/24/2019.  Continue Retacrit as needed once Jakafi restarted. 5.   Dementia             Patient appears to have underlying dementia.             Patient remains adamant against seeing a neurologist. 6.   Enterobacter cloacae UTI  Patient completed a course of Septra.  Patient  denies any UTI symptoms. 7.   Right cheek lesion  Encourage follow-up with dermatology. 8.   No fluids this week. 9.   Refill Jakafi 5 mg BID. 10.   RTC in 1 week for MD assessment and labs (CBC with diff, BMP).  I discussed the assessment and treatment plan with the patient.  The patient was provided an opportunity to ask questions and all were answered.  The patient agreed with the plan and demonstrated an understanding of the instructions.  The patient was advised to call back if the symptoms worsen or if the condition fails to improve as anticipated.  I provided 18 minutes of face-to-face time during this this encounter and > 50% was spent counseling as documented under my assessment and plan.  An additional 7 minutes were spent reviewing her chart (Epic and Care Everywhere) including notes, labs, and imaging studies.    Lequita Asal, MD, PhD    01/12/2020, 1:16 PM    I, Mirian Mo Tufford, am acting as Education administrator for [employer]. Mike Gip, MD, PhD.  I, Carianne Taira C. Mike Gip, MD, have reviewed the above documentation for accuracy and completeness, and I agree with the above.

## 2020-01-12 ENCOUNTER — Encounter: Payer: Self-pay | Admitting: Hematology and Oncology

## 2020-01-12 ENCOUNTER — Inpatient Hospital Stay: Payer: Medicare Other

## 2020-01-12 ENCOUNTER — Other Ambulatory Visit: Payer: Self-pay | Admitting: Hematology and Oncology

## 2020-01-12 ENCOUNTER — Other Ambulatory Visit: Payer: Self-pay

## 2020-01-12 ENCOUNTER — Inpatient Hospital Stay (HOSPITAL_BASED_OUTPATIENT_CLINIC_OR_DEPARTMENT_OTHER): Payer: Medicare Other | Admitting: Hematology and Oncology

## 2020-01-12 VITALS — BP 130/56 | HR 76 | Temp 97.6°F | Resp 18 | Ht 67.0 in | Wt 110.6 lb

## 2020-01-12 DIAGNOSIS — D45 Polycythemia vera: Secondary | ICD-10-CM

## 2020-01-12 DIAGNOSIS — Z7189 Other specified counseling: Secondary | ICD-10-CM

## 2020-01-12 DIAGNOSIS — H9190 Unspecified hearing loss, unspecified ear: Secondary | ICD-10-CM | POA: Diagnosis not present

## 2020-01-12 DIAGNOSIS — D649 Anemia, unspecified: Secondary | ICD-10-CM

## 2020-01-12 DIAGNOSIS — B952 Enterococcus as the cause of diseases classified elsewhere: Secondary | ICD-10-CM | POA: Diagnosis not present

## 2020-01-12 DIAGNOSIS — N289 Disorder of kidney and ureter, unspecified: Secondary | ICD-10-CM

## 2020-01-12 DIAGNOSIS — C44329 Squamous cell carcinoma of skin of other parts of face: Secondary | ICD-10-CM | POA: Diagnosis not present

## 2020-01-12 DIAGNOSIS — N390 Urinary tract infection, site not specified: Secondary | ICD-10-CM

## 2020-01-12 DIAGNOSIS — R251 Tremor, unspecified: Secondary | ICD-10-CM | POA: Diagnosis not present

## 2020-01-12 DIAGNOSIS — R519 Headache, unspecified: Secondary | ICD-10-CM | POA: Diagnosis not present

## 2020-01-12 LAB — CBC WITH DIFFERENTIAL/PLATELET
Abs Immature Granulocytes: 0.36 10*3/uL — ABNORMAL HIGH (ref 0.00–0.07)
Basophils Absolute: 0.1 10*3/uL (ref 0.0–0.1)
Basophils Relative: 1 %
Eosinophils Absolute: 0.6 10*3/uL — ABNORMAL HIGH (ref 0.0–0.5)
Eosinophils Relative: 5 %
HCT: 31.6 % — ABNORMAL LOW (ref 36.0–46.0)
Hemoglobin: 9.3 g/dL — ABNORMAL LOW (ref 12.0–15.0)
Immature Granulocytes: 3 %
Lymphocytes Relative: 14 %
Lymphs Abs: 1.5 10*3/uL (ref 0.7–4.0)
MCH: 29.1 pg (ref 26.0–34.0)
MCHC: 29.4 g/dL — ABNORMAL LOW (ref 30.0–36.0)
MCV: 98.8 fL (ref 80.0–100.0)
Monocytes Absolute: 1 10*3/uL (ref 0.1–1.0)
Monocytes Relative: 9 %
Neutro Abs: 7.2 10*3/uL (ref 1.7–7.7)
Neutrophils Relative %: 68 %
Platelets: 620 10*3/uL — ABNORMAL HIGH (ref 150–400)
RBC: 3.2 MIL/uL — ABNORMAL LOW (ref 3.87–5.11)
RDW: 24.3 % — ABNORMAL HIGH (ref 11.5–15.5)
WBC: 10.7 10*3/uL — ABNORMAL HIGH (ref 4.0–10.5)
nRBC: 0.4 % — ABNORMAL HIGH (ref 0.0–0.2)

## 2020-01-12 LAB — BASIC METABOLIC PANEL
Anion gap: 8 (ref 5–15)
BUN: 20 mg/dL (ref 8–23)
CO2: 26 mmol/L (ref 22–32)
Calcium: 8.6 mg/dL — ABNORMAL LOW (ref 8.9–10.3)
Chloride: 99 mmol/L (ref 98–111)
Creatinine, Ser: 1.14 mg/dL — ABNORMAL HIGH (ref 0.44–1.00)
GFR, Estimated: 42 mL/min — ABNORMAL LOW (ref >60)
Glucose, Bld: 105 mg/dL — ABNORMAL HIGH (ref 70–99)
Potassium: 3.8 mmol/L (ref 3.5–5.1)
Sodium: 133 mmol/L — ABNORMAL LOW (ref 135–145)

## 2020-01-12 MED ORDER — RUXOLITINIB PHOSPHATE 5 MG PO TABS: ORAL_TABLET | ORAL | 0 refills | Status: DC

## 2020-01-12 NOTE — Progress Notes (Signed)
Daughter reports she has been drinking better

## 2020-01-16 DIAGNOSIS — C44329 Squamous cell carcinoma of skin of other parts of face: Secondary | ICD-10-CM | POA: Insufficient documentation

## 2020-01-18 NOTE — Progress Notes (Signed)
[facility]  [address], [address] [place], [place] [address] Phone: [phone]  Fax: [phone]   Clinic Day:  01/19/2020   Referring physician: Lequita Asal, MD  Chief Complaint: Laura Wilcox is a 86 y.o. female with polycythemia rubra vera (PV)on [name] who is seen for 1 week assessment.   HPI: The patient was last seen in the hematology clinic on 01/12/2020. At that time, she was doing "ok".  She continued to sleep 20 hours per day.  She was drinking more fluid.  Exam revealed a left cheek carcinoma.  Hematocrit was 31.6, hemoglobin 9.3, MCV 98.8, platelets 620,000, WBC 10,700 (ANC 7,200). Sodium was 133. Creatnine was 1.14 (CrCl 42 ml/min). Calcium was 8.6. We discussed reinitiation of Jakafi 5 mg BID.  We discussed a trial off IVFs.  She was encouraged to see the dermatologist.  She again declined a neurology consultation.  During the interim, she has been "great." Her energy level has been better. She was out of the house for a couple of hours over the past 2 days. She takes 2-3 hour naps after breakfast.  She missed two doses of Jakafi over the past week.  The patient's fluid intake has decreased to about 24 oz per day. She wanted her daughter to let her manage her water intake herself and they created a chart so she could log her intake.  She made an appointment with Dr. Lacinda Axon, dermatologist, in [place] but could not get one until 05/2020.   Past Medical History:  Diagnosis Date  . Polycythemia     Past Surgical History:  Procedure Laterality Date  . ABDOMINAL HYSTERECTOMY     complete  . BREAST SURGERY      Family History  Problem Relation Age of Onset  . Heart disease Father   . Cancer Sister        breast  . Cancer Sister        breast  . Alzheimer's disease Sister     Social History:  reports that she quit smoking about 41 years ago. She has a 30.00 pack-year smoking history. She has never used smokeless tobacco. She  reports that she does not drink alcohol and does not use drugs. She is widowed. Patient is retired Sport and exercise psychologist. Patient moved to [facility] in 03/2017. She lives independently in her own apartment in [place]. Her daughter lives next door.Patient denies known exposures to radiation on toxins.Pt's daughter, [name] be reached at [phone]. She lives in [place].  The patient is accompanied by her daughter today.   Allergies: No Known Allergies  Current Medications: Current Outpatient Medications  Medication Sig Dispense Refill  . calcitRIOL (ROCALTROL) 0.25 MCG capsule Take 0.25 mcg by mouth 3 (three) times a week.     . ruxolitinib phosphate (JAKAFI) 5 MG tablet TAKE 1 TABLET (5 MG TOTAL) BY MOUTH 2 (TWO) TIMES DAILY. 60 tablet 0  . amoxicillin-clavulanate (AUGMENTIN) 875-125 MG tablet  (Patient not taking: Reported on 12/27/2019)    . JAKAFI 10 MG tablet TAKE 1 TABLET (10 MG TOTAL) BY MOUTH 2 TIMES DAILY. (Patient not taking: Reported on 12/27/2019) 60 tablet 1  . sulfamethoxazole-trimethoprim (BACTRIM) 400-80 MG tablet Take 1 tablet by mouth 2 (two) times daily. (Patient not taking: Reported on 01/12/2020) 14 tablet 0   No current facility-administered medications for this visit.    Review of Systems  Constitutional: Positive for weight loss (stable). Negative for chills, diaphoresis, fever and malaise/fatigue (Energy level has improved. Naps after breakfast.).  HENT: Positive for hearing loss. Negative for congestion, ear discharge, ear pain, nosebleeds, sinus pain, sore throat and tinnitus.   Eyes: Negative.  Negative for blurred vision.  Respiratory: Negative.  Negative for cough, hemoptysis, sputum production and shortness of breath.   Cardiovascular: Negative.  Negative for chest pain, palpitations and leg swelling.  Gastrointestinal: Negative for abdominal pain, blood in stool, constipation, diarrhea, heartburn, melena, nausea and vomiting.       Fluid intake has decreased.  Genitourinary:  Negative.  Negative for dysuria, frequency, hematuria and urgency.  Musculoskeletal: Negative.  Negative for back pain, joint pain, myalgias and neck pain.  Skin: Negative.  Negative for itching and rash.  Neurological: Negative.  Negative for dizziness, tingling, sensory change, weakness and headaches.  Endo/Heme/Allergies: Negative.  Does not bruise/bleed easily.  Psychiatric/Behavioral: Positive for memory loss. Negative for depression. The patient is not nervous/anxious and does not have insomnia.   All other systems reviewed and are negative.  Performance status (ECOG): 2-3  Vitals Blood pressure (!) 136/57, pulse 78, temperature (!) 97.1 F (36.2 C), temperature source Tympanic, resp. rate 18, height 5\' 7"  (1.702 m), weight 110 lb 10.7 oz (50.2 kg), SpO2 99 %.   Physical Exam Vitals and nursing note reviewed.  Constitutional:      General: She is not in acute distress.    Appearance: She is not diaphoretic.     Interventions: Face mask in place.     Comments: Requires assistance onto exam table.  HENT:     Head: Normocephalic and atraumatic.     Comments: Short dark blonde hair.    Mouth/Throat:     Mouth: Mucous membranes are moist.     Pharynx: Oropharynx is clear.  Eyes:     General: No scleral icterus.    Extraocular Movements: Extraocular movements intact.     Conjunctiva/sclera: Conjunctivae normal.     Pupils: Pupils are equal, round, and reactive to light.     Comments: Blue eyes  Cardiovascular:     Rate and Rhythm: Normal rate and regular rhythm.     Pulses: Normal pulses.     Heart sounds: Normal heart sounds. No murmur heard.   Pulmonary:     Effort: Pulmonary effort is normal. No respiratory distress.     Breath sounds: Normal breath sounds. No wheezing or rales.  Chest:     Chest wall: No tenderness.  Abdominal:     General: Bowel sounds are normal. There is no distension.     Palpations: Abdomen is soft. There is no hepatomegaly, splenomegaly or mass.      Tenderness: There is no abdominal tenderness. There is no guarding or rebound.  Musculoskeletal:        General: No swelling or tenderness. Normal range of motion.     Cervical back: Normal range of motion and neck supple.  Lymphadenopathy:     Head:     Right side of head: No preauricular, posterior auricular or occipital adenopathy.     Left side of head: No preauricular, posterior auricular or occipital adenopathy.     Cervical: No cervical adenopathy.     Upper Body:     Right upper body: No supraclavicular or axillary adenopathy.     Left upper body: No supraclavicular or axillary adenopathy.     Lower Body: No right inguinal adenopathy. No left inguinal adenopathy.  Skin:    General: Skin is warm and dry.  Neurological:     Mental Status: She is alert  and oriented to person, place, and time. Mental status is at baseline.  Psychiatric:        Mood and Affect: Mood normal.        Thought Content: Thought content normal.      01/04/2020     Appointment on 01/19/2020  Component Date Value Ref Range Status  . Sodium 01/19/2020 132* 135 - 145 mmol/L Final  . Potassium 01/19/2020 4.0  3.5 - 5.1 mmol/L Final  . Chloride 01/19/2020 98  98 - 111 mmol/L Final  . CO2 01/19/2020 25  22 - 32 mmol/L Final  . Glucose, Bld 01/19/2020 114* 70 - 99 mg/dL Final   Glucose reference range applies only to samples taken after fasting for at least 8 hours.  . BUN 01/19/2020 27* 8 - 23 mg/dL Final  . Creatinine, Ser 01/19/2020 1.21* 0.44 - 1.00 mg/dL Final  . Calcium 01/19/2020 8.7* 8.9 - 10.3 mg/dL Final  . GFR, Estimated 01/19/2020 39* >60 mL/min Final  . Anion gap 01/19/2020 9  5 - 15 Final   Performed at [facility], [address], [place], [place] [address]  . WBC 01/19/2020 10.4  4.0 - 10.5 K/uL Final  . RBC 01/19/2020 3.28* 3.87 - 5.11 MIL/uL Final  . Hemoglobin 01/19/2020 9.7* 12.0 - 15.0 g/dL Final  . HCT 01/19/2020 32.1* 36 - 46 % Final  . MCV 01/19/2020 97.9   80.0 - 100.0 fL Final  . MCH 01/19/2020 29.6  26.0 - 34.0 pg Final  . MCHC 01/19/2020 30.2  30.0 - 36.0 g/dL Final  . RDW 01/19/2020 22.9* 11.5 - 15.5 % Final  . Platelets 01/19/2020 732* 150 - 400 K/uL Final  . nRBC 01/19/2020 0.0  0.0 - 0.2 % Final  . Neutrophils Relative % 01/19/2020 62  % Final  . Neutro Abs 01/19/2020 6.6  1.7 - 7.7 K/uL Final  . Lymphocytes Relative 01/19/2020 18  % Final  . Lymphs Abs 01/19/2020 1.9  0.7 - 4.0 K/uL Final  . Monocytes Relative 01/19/2020 9  % Final  . Monocytes Absolute 01/19/2020 0.9  0.1 - 1.0 K/uL Final  . Eosinophils Relative 01/19/2020 5  % Final  . Eosinophils Absolute 01/19/2020 0.5  0.0 - 0.5 K/uL Final  . Basophils Relative 01/19/2020 2  % Final  . Basophils Absolute 01/19/2020 0.2* 0.0 - 0.1 K/uL Final  . Immature Granulocytes 01/19/2020 4  % Final  . Abs Immature Granulocytes 01/19/2020 0.37* 0.00 - 0.07 K/uL Final   Performed at [facility], [address] [place], [place] [address]    Assessment:  Laura Wilcox is a 86 y.o. female with polycythemia rubra veraand secondary myelofibrosis. She presented in 04/2002 with thrombocytosis (835,000). JAK2 V617Fwas positive on 05/20/2012.  Over the past year,hematocrithas ranged 37.5 - 42.4,hemoglobin12.1 - 13.4, platelets458,000 - 569,000, WBC10,500 - 15,700. Creatinine has been 1.0 - 1.2.  Bone marrowon 04/15/2002 revealed a normocellular marrow for age with trilineage hematopoiesis and mild megakaryocytic hyperplasia with focal atypia. There was no morphologic evidence of a myeloproliferative disorder. Iron stains were inadequate for evaluation of storage iron. There was no increase in reticulin fibers. She has been on agrylin since 2012.  Bone marrowon 09/25/2012 revealed a persistent myeloproliferative disorder s/p Agrylin. The current features were best classified as persistent myeloproliferative neoplasm (MPN) JAK2 V617F mutation positive. Marrow was  hypercellular for age (30-50%) with increased trilineage hematopoiesis and markedly increased atypical megakaryopoiesis with frequent clustering. There were no increased blasts. Iron stores were  absent. There was moderate to severe myelofibrosis(grade MF 2-3). Differential included ET, PV, primary myelofibrosis, and MPN, unclassifiable. Cytogeneticswere normal (59, XX).  She developed pancytopeniain 12/2015. Etiology is unclear (medication confusion or hydroxyurea + Agrylin).  She received Procrit40,000 units (intermittently 09/2012 - 06/2016), Venofer(09/2012 - 10/2012), and Infed(12/2015 - 03/2016). She received Neupogenduring a period of neutropenia. She was anagrelidefrom 2004 - 08/13/2017. She beganJakafion 08/14/2017. Jakafi was increased from 5 mg BID to 10 mg BID on03/18/2020.Jakafi was increased to 15 mg BID on 08/03/2018.She has been back on Jakafi 5 mg BID since 02/01/2019 then 10 mg BID on 05/25/2019.  [name] was on hold from 12/20/2019 - 01/12/2020.  She began a phlebotomy programon 08/06/2017. She undergoes small volume (150 cc with replacement of 150 cc NS)if her HCT >42.  Ferritinhas been followed: 529 on 09/03/2017, 1020 on 12/03/2017, 423 on 02/18/2018, 858 on 11/17/2018, 785 on 09/23/2019 and 728 on 12/20/2019.Iron saturationwas 37%and TIBC 270 on08/18/2020.B12 was 477 and folate 36 on 11/17/2018.  B12 was 292 and folate 19.1 on 12/20/2019  She was admitted to [facility] 12/17/2018 - 12/18/2018. She received 1 unit of PRBCs.CBC at discharge included a hematocrit of 29.0, hemoglobin 9.4, platelets 364,000, and WBC 10,200.  She received a course of Keflexfor a Proteus mirabilisUTIbeginning 01/11/2019.  Urine culture on 12/27/2019 revealed greater than 100,000 colonies of Enterobacter cloacae.  She was treated with Septra secondary to sensitivities.  The patient has received both doses of the COVID-19 vaccine.  Symptomatically, she feels  "great".  Her energy level has been better. She was out of the house for a couple of hours over the past 2 days. She takes 2-3 hour naps after breakfast.  Her daughter has created a water chart for her fluid intake.  Plan: 1.   Labs today: CBC with diff, BMP. 2.Polycythemia rubra vera             Hematocrit 27.3.  Hemoglobin  8.5.  Platelets 562,000 on 12/20/2019.  Hematocrit 27.6.  Hemoglobin  8.4.  Platelets 514,000 on 12/27/2019.  Hematocrit28.3.Hemoglobin 8.5.Platelets451,000 on 01/04/2020. Hematocrit 31.6. Hemoglobin 9.3.Platelets 620,000 on 01/12/2020.  Hematocrit 32.1.  Hemoglobin  9.7.  Platelets 732,000 on 01/19/2020 Platelet count goal < 400,000. Jakafi 5 mg BID restarted on 01/12/2020.  She missed 2 doses. Anticipate improvement in platelet count over the next few weeks. 3.Renal insufficiency BUN28and creatinine1.15 on 12/20/2019. BUN 15 and creatinine 1.14 on 12/27/2019. BUN 27 and creatinine 1.21 on 01/19/2020. Patient doing better with a water chart prepared by her daughter.   Fluid goal is 5 glasses/day. Patient's daughter to call if fluids needed over the next 2 weeks.Marland Kitchen 4.Anemia             Hematocrit 32.1.  Hemoglobin 9.7.  MCV 97.9.    Etiology felt secondary to [facility] as hemoglobin has improved off Jakafi.             Ferritin 728 with an iron saturation of 26% and a TIBC 223 on 12/20/2019.             B12 was 292 with a folate of 19.1 on 12/20/2019.              B12 goal is 400.             Jakafi restarted last week.             Patient received Retacrit on 12/24/2019.  Continue Retacrit every 2 weeks as needed. 5.   Dementia  Patient appears to have underlying dementia.             Patient [place] neurology consultation. 6.     Left cheek lesion  Lesion appears to be a squamous cell skin cancer.  She has appointment with dermatology (request earlier appointment). 7.   Patient's daughter to call if fluids needed over  the next 2 weeks. 8.   RTC in 2 weeks for MD assessment and labs (CBC with diff, BMP).  I discussed the assessment and treatment plan with the patient.  The patient was provided an opportunity to ask questions and all were answered.  The patient agreed with the plan and demonstrated an understanding of the instructions.  The patient was advised to call back if the symptoms worsen or if the condition fails to improve as anticipated.  I provided 18 minutes of face-to-face time during this this encounter and > 50% was spent counseling as documented under my assessment and plan.     Lequita Asal, MD, PhD    01/19/2020, 1:44 PM   I, Mirian Mo Tufford, am acting as Education administrator for [employer]. Mike Gip, MD, PhD.  I, Melissa C. Mike Gip, MD, have reviewed the above documentation for accuracy and completeness, and I agree with the above.

## 2020-01-19 ENCOUNTER — Inpatient Hospital Stay (HOSPITAL_BASED_OUTPATIENT_CLINIC_OR_DEPARTMENT_OTHER): Payer: Medicare Other | Admitting: Hematology and Oncology

## 2020-01-19 ENCOUNTER — Inpatient Hospital Stay: Payer: Medicare Other

## 2020-01-19 ENCOUNTER — Other Ambulatory Visit: Payer: Self-pay

## 2020-01-19 ENCOUNTER — Encounter: Payer: Self-pay | Admitting: Hematology and Oncology

## 2020-01-19 VITALS — BP 136/57 | HR 78 | Temp 97.1°F | Resp 18 | Ht 67.0 in | Wt 110.7 lb

## 2020-01-19 DIAGNOSIS — D649 Anemia, unspecified: Secondary | ICD-10-CM

## 2020-01-19 DIAGNOSIS — R519 Headache, unspecified: Secondary | ICD-10-CM | POA: Diagnosis not present

## 2020-01-19 DIAGNOSIS — B952 Enterococcus as the cause of diseases classified elsewhere: Secondary | ICD-10-CM | POA: Diagnosis not present

## 2020-01-19 DIAGNOSIS — C44329 Squamous cell carcinoma of skin of other parts of face: Secondary | ICD-10-CM

## 2020-01-19 DIAGNOSIS — D45 Polycythemia vera: Secondary | ICD-10-CM

## 2020-01-19 DIAGNOSIS — R4189 Other symptoms and signs involving cognitive functions and awareness: Secondary | ICD-10-CM

## 2020-01-19 DIAGNOSIS — N289 Disorder of kidney and ureter, unspecified: Secondary | ICD-10-CM | POA: Diagnosis not present

## 2020-01-19 DIAGNOSIS — H9190 Unspecified hearing loss, unspecified ear: Secondary | ICD-10-CM | POA: Diagnosis not present

## 2020-01-19 DIAGNOSIS — R251 Tremor, unspecified: Secondary | ICD-10-CM | POA: Diagnosis not present

## 2020-01-19 LAB — BASIC METABOLIC PANEL
Anion gap: 9 (ref 5–15)
BUN: 27 mg/dL — ABNORMAL HIGH (ref 8–23)
CO2: 25 mmol/L (ref 22–32)
Calcium: 8.7 mg/dL — ABNORMAL LOW (ref 8.9–10.3)
Chloride: 98 mmol/L (ref 98–111)
Creatinine, Ser: 1.21 mg/dL — ABNORMAL HIGH (ref 0.44–1.00)
GFR, Estimated: 39 mL/min — ABNORMAL LOW (ref >60)
Glucose, Bld: 114 mg/dL — ABNORMAL HIGH (ref 70–99)
Potassium: 4 mmol/L (ref 3.5–5.1)
Sodium: 132 mmol/L — ABNORMAL LOW (ref 135–145)

## 2020-01-19 LAB — CBC WITH DIFFERENTIAL/PLATELET
Abs Immature Granulocytes: 0.37 10*3/uL — ABNORMAL HIGH (ref 0.00–0.07)
Basophils Absolute: 0.2 10*3/uL — ABNORMAL HIGH (ref 0.0–0.1)
Basophils Relative: 2 %
Eosinophils Absolute: 0.5 10*3/uL (ref 0.0–0.5)
Eosinophils Relative: 5 %
HCT: 32.1 % — ABNORMAL LOW (ref 36.0–46.0)
Hemoglobin: 9.7 g/dL — ABNORMAL LOW (ref 12.0–15.0)
Immature Granulocytes: 4 %
Lymphocytes Relative: 18 %
Lymphs Abs: 1.9 10*3/uL (ref 0.7–4.0)
MCH: 29.6 pg (ref 26.0–34.0)
MCHC: 30.2 g/dL (ref 30.0–36.0)
MCV: 97.9 fL (ref 80.0–100.0)
Monocytes Absolute: 0.9 10*3/uL (ref 0.1–1.0)
Monocytes Relative: 9 %
Neutro Abs: 6.6 10*3/uL (ref 1.7–7.7)
Neutrophils Relative %: 62 %
Platelets: 732 10*3/uL — ABNORMAL HIGH (ref 150–400)
RBC: 3.28 MIL/uL — ABNORMAL LOW (ref 3.87–5.11)
RDW: 22.9 % — ABNORMAL HIGH (ref 11.5–15.5)
WBC: 10.4 10*3/uL (ref 4.0–10.5)
nRBC: 0 % (ref 0.0–0.2)

## 2020-01-19 NOTE — Progress Notes (Signed)
No trouble with urination today/ if the patient is drinking enough and RBC are better.

## 2020-01-19 NOTE — Patient Instructions (Signed)
  Please call if patient does not meet fluid goals (5 glasses/day) in order to receive fluids the following day.

## 2020-01-24 MED FILL — JAKAFI 5 MG TABLET: 5 | 30 days supply | Qty: 60 | Fill #0

## 2020-01-31 ENCOUNTER — Ambulatory Visit (INDEPENDENT_AMBULATORY_CARE_PROVIDER_SITE_OTHER): Payer: Medicare Other | Admitting: Internal Medicine

## 2020-01-31 ENCOUNTER — Other Ambulatory Visit: Payer: Self-pay

## 2020-01-31 ENCOUNTER — Other Ambulatory Visit: Payer: Self-pay | Admitting: Internal Medicine

## 2020-01-31 ENCOUNTER — Encounter: Payer: Self-pay | Admitting: Internal Medicine

## 2020-01-31 VITALS — BP 106/62 | HR 85 | Temp 97.9°F | Ht 67.0 in | Wt 110.0 lb

## 2020-01-31 DIAGNOSIS — N390 Urinary tract infection, site not specified: Secondary | ICD-10-CM | POA: Diagnosis not present

## 2020-01-31 DIAGNOSIS — D7581 Myelofibrosis: Secondary | ICD-10-CM | POA: Diagnosis not present

## 2020-01-31 DIAGNOSIS — D61818 Other pancytopenia: Secondary | ICD-10-CM | POA: Diagnosis not present

## 2020-01-31 DIAGNOSIS — N1832 Chronic kidney disease, stage 3b: Secondary | ICD-10-CM

## 2020-01-31 DIAGNOSIS — E213 Hyperparathyroidism, unspecified: Secondary | ICD-10-CM

## 2020-01-31 DIAGNOSIS — C44329 Squamous cell carcinoma of skin of other parts of face: Secondary | ICD-10-CM | POA: Diagnosis not present

## 2020-01-31 DIAGNOSIS — E440 Moderate protein-calorie malnutrition: Secondary | ICD-10-CM | POA: Insufficient documentation

## 2020-01-31 DIAGNOSIS — D45 Polycythemia vera: Secondary | ICD-10-CM

## 2020-01-31 DIAGNOSIS — E46 Unspecified protein-calorie malnutrition: Secondary | ICD-10-CM | POA: Insufficient documentation

## 2020-01-31 NOTE — Progress Notes (Signed)
Date:  01/31/2020   Name:  Laura Wilcox   DOB:  01-26-1927   MRN:  [id number]  Patient is here with her daughter Laura Wilcox. Chief Complaint: Establish Care (New patient. )  Urinary Tract Infection  This is a recurrent (dealt with this all last month and finally got it cleared after 4 rounds of antibiotics) problem. The problem has been resolved. Pain scale: manifested as lethargy and decreased appetite. The patient is experiencing no pain. There has been no fever.  PCV and myelofibrosis - followed closely by Dr. Mike Gip.  She is now on Jakafi and needs to consume more fluids.  She is keeping a log and trying harder.  She feels well, generally has good energy.  Her daughter Laura Wilcox is very involved. CKD - followed by Dr. Holley Raring; has secondary hyperPTH for which she is taking Rocaltrol.  Lab Results  Component Value Date   CREATININE 1.21 (H) 01/19/2020   BUN 27 (H) 01/19/2020   NA 132 (L) 01/19/2020   K 4.0 01/19/2020   CL 98 01/19/2020   CO2 25 01/19/2020   Lab Results  Component Value Date   CHOL 279 (H) 05/27/2018   HDL 72 05/27/2018   LDLCALC 182 (H) 05/27/2018   TRIG 125 05/27/2018   CHOLHDL 3.9 05/27/2018   Lab Results  Component Value Date   TSH 2.085 12/20/2019   No results found for: HGBA1C Lab Results  Component Value Date   WBC 10.4 01/19/2020   HGB 9.7 (L) 01/19/2020   HCT 32.1 (L) 01/19/2020   MCV 97.9 01/19/2020   PLT 732 (H) 01/19/2020   Lab Results  Component Value Date   ALT 12 12/20/2019   AST 18 12/20/2019   ALKPHOS 69 12/20/2019   BILITOT 0.4 12/20/2019     Review of Systems  Constitutional: Negative for fatigue, fever and unexpected weight change (has always been around 110 lbs).  HENT: Positive for hearing loss. Negative for trouble swallowing.   Respiratory: Negative for cough, chest tightness and shortness of breath.   Cardiovascular: Negative for chest pain and leg swelling.  Gastrointestinal: Negative for abdominal pain and  constipation.  Genitourinary: Negative for dysuria.  Musculoskeletal: Negative for gait problem.  Neurological: Negative for headaches.  Psychiatric/Behavioral: Positive for confusion (mild cognitive impairment). Negative for dysphoric mood and sleep disturbance. The patient is not nervous/anxious.     Patient Active Problem List   Diagnosis Date Noted  . Squamous cell cancer of skin of left cheek 01/16/2020  . Chronic kidney disease, stage 3b ([place]) 03/11/2019  . Secondary hyperparathyroidism of renal origin ([place]) 03/11/2019  . Anemia in chronic kidney disease 03/11/2019  . Generalized weakness 12/17/2018  . Elevated ferritin 02/18/2018  . Goals of care, counseling/discussion 07/09/2017  . Polycythemia rubra vera ([place]) 06/02/2017  . Underweight 06/02/2017  . Advanced care planning/counseling discussion 06/02/2017  . Cognitive impairment 06/02/2017    No Known Allergies  Past Surgical History:  Procedure Laterality Date  . ABDOMINAL HYSTERECTOMY     complete  . BREAST SURGERY      Social History   Tobacco Use  . Smoking status: Former Smoker    Packs/day: 1.00    Years: 30.00    Pack years: 30.00    Quit date: 04/02/1978    Years since quitting: 41.8  . Smokeless tobacco: Never Used  Vaping Use  . Vaping Use: Never used  Substance Use Topics  . Alcohol use: No    Comment: occasional glass of wine  .  Drug use: No     Medication list has been reviewed and updated.  Current Meds  Medication Sig  . calcitRIOL (ROCALTROL) 0.25 MCG capsule Take 0.25 mcg by mouth 3 (three) times a week.   . ruxolitinib phosphate (JAKAFI) 5 MG tablet TAKE 1 TABLET (5 MG TOTAL) BY MOUTH 2 (TWO) TIMES DAILY.    PHQ 2/9 Scores 01/31/2020 06/02/2017  PHQ - 2 Score 0 0  PHQ- 9 Score 0 0    GAD 7 : Generalized Anxiety Score 01/31/2020  Nervous, Anxious, on Edge 0  Control/stop worrying 0  Worry too much - different things 0  Trouble relaxing 0  Restless 0  Easily annoyed or irritable 0   Afraid - awful might happen 0  Total GAD 7 Score 0  Anxiety Difficulty Not difficult at all    BP Readings from Last 3 Encounters:  01/31/20 106/62  01/19/20 (!) 136/57  01/12/20 (!) 130/56    Physical Exam Constitutional:      Appearance: Normal appearance.  Neck:     Vascular: No carotid bruit.  Cardiovascular:     Rate and Rhythm: Normal rate and regular rhythm.     Pulses: Normal pulses.  Pulmonary:     Effort: Pulmonary effort is normal. No respiratory distress.     Breath sounds: No wheezing or rhonchi.  Abdominal:     Palpations: Abdomen is soft.     Tenderness: There is no abdominal tenderness.  Musculoskeletal:     Cervical back: Normal range of motion.     Right lower leg: No edema.     Left lower leg: No edema.  Lymphadenopathy:     Cervical: No cervical adenopathy.  Skin:    General: Skin is warm and dry.     Findings: Lesion (on left cheek - SCCA awaiting MOHS surgery) present.  Neurological:     Mental Status: She is alert.  Psychiatric:        Mood and Affect: Mood normal.     Wt Readings from Last 3 Encounters:  01/31/20 110 lb (49.9 kg)  01/19/20 110 lb 10.7 oz (50.2 kg)  01/12/20 110 lb 9.3 oz (50.2 kg)    BP 106/62   Pulse 85   Temp 97.9 F (36.6 C) (Oral)   Ht 5\' 7"  (1.702 m)   Wt 110 lb (49.9 kg)   SpO2 98%   BMI 17.23 kg/m   Assessment and Plan: 1. Myelofibrosis ([place]) Followed closely by Hematology Dr. Mike Gip  2. Other pancytopenia ([place]) stable  3. Hyperparathyroidism, unspecified ([place]) Followed by Dr. Holley Raring On Calcitriol  4. Chronic kidney disease, stage 3b ([facility]) stable  5. Recurrent UTI Finally resolved after 4 courses of antibiotics She will return here if sx recur  6. Squamous cell cancer of skin of left cheek Biopsy confirmed Now waiting for MOHS surgery in February   Partially dictated using Editor, commissioning. Any errors are unintentional.  Halina Maidens, MD [facility]  01/31/2020

## 2020-02-01 NOTE — Progress Notes (Signed)
[facility]  [address] [place], [place] [address] Phone: [phone]  Fax: [phone]   Clinic Day:  02/02/2020   Referring physician: No ref. provider found  Chief Complaint: Laura Wilcox is a 86 y.o. female with polycythemia rubra vera (PV)on Jakafi who is seen for 2 week assessment.   HPI: The patient was last seen in the hematology clinic on 01/19/2020. At that time, she felt "great". Her energy level had been better. She took 2-3 hour naps after breakfast.  Her daughter had created a water chart for her fluid intake. Hematocrit was 32.1, hemoglobin 9.7, MCV 97.9, platelets 732,000, WBC 10,400. Sodium was 132.  Creatinine was 1.21 (CrCl 39 ml/min). Calcium was 8.7. She had restarted Jakafi the week prior. Her daughter was to call if the patient needed fluids.  She met with Dr Army Melia on 01/31/2020 to establish care.  During the interim, she has been "sleepy." She had a great week last week and got out of the house about 3 times. One of the days, she wanted to leave the house 3 times in one day. This week, she has been tired again and has been taking 2-3 naps per day.  She has been drinking more water. She has missed a couple doses of Jakafi.  She got hearing aids right before the COVID-19 pandemic but does not like to wear them.   Past Medical History:  Diagnosis Date  . Confusion 12/30/2018  . Polycythemia   . Urinary tract infection without hematuria 01/02/2020  . Urticaria 06/02/2017    Past Surgical History:  Procedure Laterality Date  . ABDOMINAL HYSTERECTOMY     complete  . BREAST SURGERY      Family History  Problem Relation Age of Onset  . Heart disease Father   . Cancer Sister        breast  . Cancer Sister        breast  . Alzheimer's disease Sister     Social History:  reports that she quit smoking about 41 years ago. She has a 30.00 pack-year smoking history. She has never used smokeless tobacco. She reports  that she does not drink alcohol and does not use drugs. She is widowed. Patient is retired Sport and exercise psychologist. Patient moved to [facility] in 03/2017. She lives independently in her own apartment in [place]. Her daughter lives next door.Patient denies known exposures to radiation on toxins.Pt's daughter, [name] be reached at [phone]. She lives in [place].  The patient is accompanied by her daughter today.   Allergies: No Known Allergies  Current Medications: Current Outpatient Medications  Medication Sig Dispense Refill  . calcitRIOL (ROCALTROL) 0.25 MCG capsule Take 0.25 mcg by mouth 3 (three) times a week.     . ruxolitinib phosphate (JAKAFI) 5 MG tablet TAKE 1 TABLET (5 MG TOTAL) BY MOUTH 2 (TWO) TIMES DAILY. 60 tablet 0   No current facility-administered medications for this visit.    Review of Systems  Constitutional: Positive for malaise/fatigue (Takes 2-3 naps daily). Negative for chills, diaphoresis, fever and weight loss (stable).       Sleepy.  HENT: Positive for hearing loss. Negative for congestion, ear discharge, ear pain, nosebleeds, sinus pain, sore throat and tinnitus.   Eyes: Negative.  Negative for blurred vision.  Respiratory: Negative.  Negative for cough, hemoptysis, sputum production and shortness of breath.   Cardiovascular: Negative.  Negative for chest pain, palpitations and leg swelling.  Gastrointestinal: Negative.  Negative for abdominal pain, blood in  stool, constipation, diarrhea, heartburn, melena, nausea and vomiting.  Genitourinary: Negative.  Negative for dysuria, frequency, hematuria and urgency.       Drinking more water.  Musculoskeletal: Negative.  Negative for back pain, joint pain, myalgias and neck pain.  Skin: Negative.  Negative for itching and rash.  Neurological: Negative.  Negative for dizziness, tingling, sensory change, weakness and headaches.  Endo/Heme/Allergies: Negative.  Does not bruise/bleed easily.  Psychiatric/Behavioral: Positive for memory  loss. Negative for depression. The patient is not nervous/anxious and does not have insomnia.   All other systems reviewed and are negative.  Performance status (ECOG): 2-3  Vitals Blood pressure 111/67, pulse 89, temperature 97.7 F (36.5 C), temperature source Tympanic, resp. rate 18, weight 110 lb 3.7 oz (50 kg), SpO2 100 %.   Physical Exam Vitals and nursing note reviewed.  Constitutional:      General: She is not in acute distress.    Appearance: She is not diaphoretic.     Interventions: Face mask in place.  HENT:     Head:     Comments: Short dark blonde hair. Eyes:     General: No scleral icterus.    Conjunctiva/sclera: Conjunctivae normal.     Comments: Blue eyes.  Abdominal:     Palpations: There is no hepatomegaly or splenomegaly.  Neurological:     Mental Status: She is alert and oriented to person, place, and time. Mental status is at baseline.  Psychiatric:        Thought Content: Thought content normal.      01/04/2020     Appointment on 02/02/2020  Component Date Value Ref Range Status  . WBC 02/02/2020 10.7* 4.0 - 10.5 K/uL Final  . RBC 02/02/2020 3.51* 3.87 - 5.11 MIL/uL Final  . Hemoglobin 02/02/2020 10.3* 12.0 - 15.0 g/dL Final  . HCT 02/02/2020 33.4* 36 - 46 % Final  . MCV 02/02/2020 95.2  80.0 - 100.0 fL Final  . MCH 02/02/2020 29.3  26.0 - 34.0 pg Final  . MCHC 02/02/2020 30.8  30.0 - 36.0 g/dL Final  . RDW 02/02/2020 21.5* 11.5 - 15.5 % Final  . Platelets 02/02/2020 741* 150 - 400 K/uL Final  . nRBC 02/02/2020 0.0  0.0 - 0.2 % Final  . Neutrophils Relative % 02/02/2020 62  % Final  . Neutro Abs 02/02/2020 6.5  1.7 - 7.7 K/uL Final  . Lymphocytes Relative 02/02/2020 18  % Final  . Lymphs Abs 02/02/2020 1.9  0.7 - 4.0 K/uL Final  . Monocytes Relative 02/02/2020 9  % Final  . Monocytes Absolute 02/02/2020 1.0  0.1 - 1.0 K/uL Final  . Eosinophils Relative 02/02/2020 5  % Final  . Eosinophils Absolute 02/02/2020 0.6* 0.0 - 0.5 K/uL Final  .  Basophils Relative 02/02/2020 2  % Final  . Basophils Absolute 02/02/2020 0.2* 0.0 - 0.1 K/uL Final  . Immature Granulocytes 02/02/2020 4  % Final  . Abs Immature Granulocytes 02/02/2020 0.40* 0.00 - 0.07 K/uL Final   Performed at [facility], [address] [place], [place] [address]    Assessment:  Lorianna Spadaccini is a 86 y.o. female with polycythemia rubra veraand secondary myelofibrosis. She presented in 04/2002 with thrombocytosis (835,000). JAK2 V617Fwas positive on 05/20/2012.  Over the past year,hematocrithas ranged 37.5 - 42.4,hemoglobin12.1 - 13.4, platelets458,000 - 569,000, WBC10,500 - 15,700. Creatinine has been 1.0 - 1.2.  Bone marrowon 04/15/2002 revealed a normocellular marrow for age with trilineage hematopoiesis and mild megakaryocytic hyperplasia with focal atypia.  There was no morphologic evidence of a myeloproliferative disorder. Iron stains were inadequate for evaluation of storage iron. There was no increase in reticulin fibers. She has been on agrylin since 2012.  Bone marrowon 09/25/2012 revealed a persistent myeloproliferative disorder s/p Agrylin. The current features were best classified as persistent myeloproliferative neoplasm (MPN) JAK2 V617F mutation positive. Marrow was hypercellular for age (30-50%) with increased trilineage hematopoiesis and markedly increased atypical megakaryopoiesis with frequent clustering. There were no increased blasts. Iron stores were absent. There was moderate to severe myelofibrosis(grade MF 2-3). Differential included ET, PV, primary myelofibrosis, and MPN, unclassifiable. Cytogeneticswere normal (49, XX).  She developed pancytopeniain 12/2015. Etiology is unclear (medication confusion or hydroxyurea + Agrylin).  She received Procrit40,000 units (intermittently 09/2012 - 06/2016), Venofer(09/2012 - 10/2012), and Infed(12/2015 - 03/2016). She received Neupogenduring a period of  neutropenia. She was anagrelidefrom 2004 - 08/13/2017. She beganJakafion 08/14/2017. Jakafi was increased from 5 mg BID to 10 mg BID on03/18/2020.Jakafi was increased to 15 mg BID on 08/03/2018.She has been back on Jakafi 5 mg BID since 02/01/2019 then 10 mg BID on 05/25/2019.  [name] was on hold from 12/20/2019 - 01/12/2020.  Jakafi 5 mg twice daily restarted on 01/12/2020.  She began a phlebotomy programon 08/06/2017. She undergoes small volume (150 cc with replacement of 150 cc NS)if her HCT >42.  Ferritinhas been followed: 529 on 09/03/2017, 1020 on 12/03/2017, 423 on 02/18/2018, 858 on 11/17/2018, 785 on 09/23/2019 and 728 on 12/20/2019.Iron saturationwas 37%and TIBC 270 on08/18/2020.B12 was 477 and folate 36 on 11/17/2018.  B12 was 292 and folate 19.1 on 12/20/2019  She was admitted to [facility] 12/17/2018 - 12/18/2018. She received 1 unit of PRBCs.CBC at discharge included a hematocrit of 29.0, hemoglobin 9.4, platelets 364,000, and WBC 10,200.  She received a course of Keflexfor a Proteus mirabilisUTIbeginning 01/11/2019.  Urine culture on 12/27/2019 revealed greater than 100,000 colonies of Enterobacter cloacae.  She was treated with Septra secondary to sensitivities.  The patient has received both doses of the COVID-19 vaccine.  Symptomatically, she has been "sleepy".  She had a great week last week; she was quite active. This week, she has been tired again and has been taking 2-3 naps per day.  She has been drinking more water. She has missed a couple doses of Jakafi.  Plan: 1.   Labs today: CBC with diff, BMP. 2.Polycythemia rubra vera             Hematocrit 27.3.  Hemoglobin   8.5.  Platelets 562,000 on 12/20/2019.  Hematocrit28.3.Hemoglobin  8.5.Platelets451,000 on 01/04/2020. Hematocrit 31.6. Hemoglobin  9.3.Platelets 620,000 on 01/12/2020.  Hematocrit 33.4.  Hemoglobin 10.3.  Platelets 741,000 on 02/02/2020. Platelet count  goal < 400,000. Jakafi 5 mg BID restarted on 01/12/2020.  She has missed a few doses. She has had difficulty in the past with Jakafi 10 mg twice daily requiring holding Jakafi. Discuss plans to change dose to Jakafi 10 mg in the morning and 5 mg in the evening. 3.Renal insufficiency BUN 27 and creatinine 1.14 today. Encourage ongoing use of the water chart.   Goal remains at 5 glasses/day Patient started to contact clinic if IV fluids are needed. 4.Anemia             Hematocrit 33.4.  Hemoglobin 10.3.  MCV 95.2.    Etiology felt secondary to [facility] as hemoglobin has improved off Jakafi.             Ferritin 728 with an iron saturation of 26% and a  TIBC 223 on 12/20/2019.             B12 was 292 with a folate of 19.1 on 12/20/2019.              B12 goal is 400.             Jakafi restarted on 01/12/2020.             Patient received Retacrit on 12/24/2019.  Continue Retacrit every 2 weeks as needed with increased dose of Jakafi. 5.   Dementia             Patient appears to have underlying dementia.             Patient remains adamant about no neurology consultation. 6.   Left cheek lesion  Lesion appears to be a squamous cell skin cancer.  Patient has an appointment with dermatology. 7.   RTC in 1 week for labs (CBC with diff, BMP). 8.   RTC in 3 weeks for labs (CBC with diff, BMP). 9.   RTC in 5 weeks for MD assessment, labs (CBC with diff, CMP).  I discussed the assessment and treatment plan with the patient.  The patient was provided an opportunity to ask questions and all were answered.  The patient agreed with the plan and demonstrated an understanding of the instructions.  The patient was advised to call back if the symptoms worsen or if the condition fails to improve as anticipated.  I provided 16 minutes of face-to-face time during this this encounter and > 50% was spent counseling as documented under my assessment and plan.     Lequita Asal, MD, PhD    02/02/2020,  3:54 PM   I, Mirian Mo Tufford, am acting as Education administrator for [employer]. Mike Gip, MD, PhD.  I, Hajra [place]. Mike Gip, MD, have reviewed the above documentation for accuracy and completeness, and I agree with the above.

## 2020-02-02 ENCOUNTER — Inpatient Hospital Stay: Payer: Medicare Other

## 2020-02-02 ENCOUNTER — Inpatient Hospital Stay: Payer: Medicare Other | Attending: Hematology and Oncology | Admitting: Hematology and Oncology

## 2020-02-02 ENCOUNTER — Encounter: Payer: Self-pay | Admitting: Hematology and Oncology

## 2020-02-02 ENCOUNTER — Other Ambulatory Visit: Payer: Self-pay

## 2020-02-02 VITALS — BP 111/67 | HR 89 | Temp 97.7°F | Resp 18 | Wt 110.2 lb

## 2020-02-02 DIAGNOSIS — Z87891 Personal history of nicotine dependence: Secondary | ICD-10-CM | POA: Insufficient documentation

## 2020-02-02 DIAGNOSIS — H9193 Unspecified hearing loss, bilateral: Secondary | ICD-10-CM | POA: Diagnosis not present

## 2020-02-02 DIAGNOSIS — C44329 Squamous cell carcinoma of skin of other parts of face: Secondary | ICD-10-CM

## 2020-02-02 DIAGNOSIS — F0390 Unspecified dementia without behavioral disturbance: Secondary | ICD-10-CM | POA: Insufficient documentation

## 2020-02-02 DIAGNOSIS — D45 Polycythemia vera: Secondary | ICD-10-CM | POA: Insufficient documentation

## 2020-02-02 DIAGNOSIS — R5383 Other fatigue: Secondary | ICD-10-CM | POA: Diagnosis not present

## 2020-02-02 DIAGNOSIS — N289 Disorder of kidney and ureter, unspecified: Secondary | ICD-10-CM

## 2020-02-02 DIAGNOSIS — D649 Anemia, unspecified: Secondary | ICD-10-CM

## 2020-02-02 DIAGNOSIS — D631 Anemia in chronic kidney disease: Secondary | ICD-10-CM

## 2020-02-02 DIAGNOSIS — N1832 Chronic kidney disease, stage 3b: Secondary | ICD-10-CM

## 2020-02-02 DIAGNOSIS — Z803 Family history of malignant neoplasm of breast: Secondary | ICD-10-CM | POA: Diagnosis not present

## 2020-02-02 DIAGNOSIS — Z79899 Other long term (current) drug therapy: Secondary | ICD-10-CM | POA: Insufficient documentation

## 2020-02-02 DIAGNOSIS — Z8269 Family history of other diseases of the musculoskeletal system and connective tissue: Secondary | ICD-10-CM | POA: Diagnosis not present

## 2020-02-02 DIAGNOSIS — R413 Other amnesia: Secondary | ICD-10-CM | POA: Insufficient documentation

## 2020-02-02 DIAGNOSIS — Z8249 Family history of ischemic heart disease and other diseases of the circulatory system: Secondary | ICD-10-CM | POA: Diagnosis not present

## 2020-02-02 DIAGNOSIS — R4189 Other symptoms and signs involving cognitive functions and awareness: Secondary | ICD-10-CM | POA: Diagnosis not present

## 2020-02-02 LAB — CBC WITH DIFFERENTIAL/PLATELET
Abs Immature Granulocytes: 0.4 10*3/uL — ABNORMAL HIGH (ref 0.00–0.07)
Basophils Absolute: 0.2 10*3/uL — ABNORMAL HIGH (ref 0.0–0.1)
Basophils Relative: 2 %
Eosinophils Absolute: 0.6 10*3/uL — ABNORMAL HIGH (ref 0.0–0.5)
Eosinophils Relative: 5 %
HCT: 33.4 % — ABNORMAL LOW (ref 36.0–46.0)
Hemoglobin: 10.3 g/dL — ABNORMAL LOW (ref 12.0–15.0)
Immature Granulocytes: 4 %
Lymphocytes Relative: 18 %
Lymphs Abs: 1.9 10*3/uL (ref 0.7–4.0)
MCH: 29.3 pg (ref 26.0–34.0)
MCHC: 30.8 g/dL (ref 30.0–36.0)
MCV: 95.2 fL (ref 80.0–100.0)
Monocytes Absolute: 1 10*3/uL (ref 0.1–1.0)
Monocytes Relative: 9 %
Neutro Abs: 6.5 10*3/uL (ref 1.7–7.7)
Neutrophils Relative %: 62 %
Platelets: 741 10*3/uL — ABNORMAL HIGH (ref 150–400)
RBC: 3.51 MIL/uL — ABNORMAL LOW (ref 3.87–5.11)
RDW: 21.5 % — ABNORMAL HIGH (ref 11.5–15.5)
WBC: 10.7 10*3/uL — ABNORMAL HIGH (ref 4.0–10.5)
nRBC: 0 % (ref 0.0–0.2)

## 2020-02-02 LAB — BASIC METABOLIC PANEL
Anion gap: 11 (ref 5–15)
BUN: 27 mg/dL — ABNORMAL HIGH (ref 8–23)
CO2: 26 mmol/L (ref 22–32)
Calcium: 9 mg/dL (ref 8.9–10.3)
Chloride: 97 mmol/L — ABNORMAL LOW (ref 98–111)
Creatinine, Ser: 1.14 mg/dL — ABNORMAL HIGH (ref 0.44–1.00)
GFR, Estimated: 45 mL/min — ABNORMAL LOW (ref >60)
Glucose, Bld: 124 mg/dL — ABNORMAL HIGH (ref 70–99)
Potassium: 3.9 mmol/L (ref 3.5–5.1)
Sodium: 134 mmol/L — ABNORMAL LOW (ref 135–145)

## 2020-02-03 ENCOUNTER — Other Ambulatory Visit: Payer: Self-pay | Admitting: Pharmacist

## 2020-02-03 DIAGNOSIS — D45 Polycythemia vera: Secondary | ICD-10-CM

## 2020-02-03 MED ORDER — RUXOLITINIB PHOSPHATE 10 MG PO TABS: 10.0000 mg | ORAL_TABLET | Freq: Every day | ORAL | 0 refills | Status: DC

## 2020-02-03 MED FILL — JAKAFI 10 MG TABLET: 10 | 30 days supply | Qty: 30 | Fill #0

## 2020-02-08 ENCOUNTER — Other Ambulatory Visit: Payer: Self-pay

## 2020-02-08 DIAGNOSIS — N1832 Chronic kidney disease, stage 3b: Secondary | ICD-10-CM

## 2020-02-08 DIAGNOSIS — N289 Disorder of kidney and ureter, unspecified: Secondary | ICD-10-CM

## 2020-02-08 DIAGNOSIS — D45 Polycythemia vera: Secondary | ICD-10-CM

## 2020-02-09 ENCOUNTER — Inpatient Hospital Stay: Payer: Medicare Other

## 2020-02-09 ENCOUNTER — Other Ambulatory Visit: Payer: Self-pay

## 2020-02-09 DIAGNOSIS — D45 Polycythemia vera: Secondary | ICD-10-CM | POA: Diagnosis not present

## 2020-02-09 DIAGNOSIS — R413 Other amnesia: Secondary | ICD-10-CM | POA: Diagnosis not present

## 2020-02-09 DIAGNOSIS — D649 Anemia, unspecified: Secondary | ICD-10-CM | POA: Diagnosis not present

## 2020-02-09 DIAGNOSIS — N1832 Chronic kidney disease, stage 3b: Secondary | ICD-10-CM

## 2020-02-09 DIAGNOSIS — D631 Anemia in chronic kidney disease: Secondary | ICD-10-CM

## 2020-02-09 DIAGNOSIS — N289 Disorder of kidney and ureter, unspecified: Secondary | ICD-10-CM

## 2020-02-09 DIAGNOSIS — R5383 Other fatigue: Secondary | ICD-10-CM | POA: Diagnosis not present

## 2020-02-09 DIAGNOSIS — F0390 Unspecified dementia without behavioral disturbance: Secondary | ICD-10-CM | POA: Diagnosis not present

## 2020-02-09 LAB — CBC WITH DIFFERENTIAL/PLATELET
Abs Immature Granulocytes: 0.36 10*3/uL — ABNORMAL HIGH (ref 0.00–0.07)
Basophils Absolute: 0.2 10*3/uL — ABNORMAL HIGH (ref 0.0–0.1)
Basophils Relative: 2 %
Eosinophils Absolute: 0.8 10*3/uL — ABNORMAL HIGH (ref 0.0–0.5)
Eosinophils Relative: 8 %
HCT: 34.4 % — ABNORMAL LOW (ref 36.0–46.0)
Hemoglobin: 10.6 g/dL — ABNORMAL LOW (ref 12.0–15.0)
Immature Granulocytes: 4 %
Lymphocytes Relative: 19 %
Lymphs Abs: 1.9 10*3/uL (ref 0.7–4.0)
MCH: 29 pg (ref 26.0–34.0)
MCHC: 30.8 g/dL (ref 30.0–36.0)
MCV: 94 fL (ref 80.0–100.0)
Monocytes Absolute: 0.6 10*3/uL (ref 0.1–1.0)
Monocytes Relative: 7 %
Neutro Abs: 6 10*3/uL (ref 1.7–7.7)
Neutrophils Relative %: 60 %
Platelets: 754 10*3/uL — ABNORMAL HIGH (ref 150–400)
RBC: 3.66 MIL/uL — ABNORMAL LOW (ref 3.87–5.11)
RDW: 22.1 % — ABNORMAL HIGH (ref 11.5–15.5)
WBC: 9.9 10*3/uL (ref 4.0–10.5)
nRBC: 0 % (ref 0.0–0.2)

## 2020-02-09 LAB — BASIC METABOLIC PANEL
Anion gap: 12 (ref 5–15)
BUN: 30 mg/dL — ABNORMAL HIGH (ref 8–23)
CO2: 26 mmol/L (ref 22–32)
Calcium: 8.9 mg/dL (ref 8.9–10.3)
Chloride: 97 mmol/L — ABNORMAL LOW (ref 98–111)
Creatinine, Ser: 1.25 mg/dL — ABNORMAL HIGH (ref 0.44–1.00)
GFR, Estimated: 41 mL/min — ABNORMAL LOW (ref >60)
Glucose, Bld: 122 mg/dL — ABNORMAL HIGH (ref 70–99)
Potassium: 3.9 mmol/L (ref 3.5–5.1)
Sodium: 135 mmol/L (ref 135–145)

## 2020-02-10 ENCOUNTER — Telehealth: Payer: Self-pay

## 2020-02-10 NOTE — Telephone Encounter (Signed)
Left a message to see how much of the [name] is she taking and did she get the new dose. I have request they give the office a call back to the office.

## 2020-02-10 NOTE — Telephone Encounter (Signed)
-----   Message from Lequita Asal, MD sent at 02/10/2020  7:07 AM EST ----- Regarding: Please call patient  Did she get the new Jakafi dose?  How is she taking Jakafi?  M ----- Message ----- From: Interface, Lab In [place] Sent: 02/09/2020   3:12 PM EST To: Lequita Asal, MD

## 2020-02-15 ENCOUNTER — Encounter: Payer: Self-pay | Admitting: Hematology and Oncology

## 2020-02-16 ENCOUNTER — Other Ambulatory Visit: Payer: Self-pay

## 2020-02-16 ENCOUNTER — Inpatient Hospital Stay: Payer: Medicare Other

## 2020-02-16 DIAGNOSIS — F0390 Unspecified dementia without behavioral disturbance: Secondary | ICD-10-CM | POA: Diagnosis not present

## 2020-02-16 DIAGNOSIS — D45 Polycythemia vera: Secondary | ICD-10-CM

## 2020-02-16 DIAGNOSIS — R5383 Other fatigue: Secondary | ICD-10-CM | POA: Diagnosis not present

## 2020-02-16 DIAGNOSIS — R413 Other amnesia: Secondary | ICD-10-CM | POA: Diagnosis not present

## 2020-02-16 DIAGNOSIS — N289 Disorder of kidney and ureter, unspecified: Secondary | ICD-10-CM | POA: Diagnosis not present

## 2020-02-16 DIAGNOSIS — D649 Anemia, unspecified: Secondary | ICD-10-CM | POA: Diagnosis not present

## 2020-02-16 MED ORDER — SODIUM CHLORIDE 0.9 % IV SOLN
Freq: Once | INTRAVENOUS | Status: AC
  Filled 2020-02-16: qty 250

## 2020-02-16 NOTE — Progress Notes (Signed)
Pt received prescribed treatment in clinic, pt stable at d/c. 

## 2020-02-17 ENCOUNTER — Inpatient Hospital Stay: Payer: Medicare Other | Attending: Hematology and Oncology

## 2020-02-17 VITALS — BP 94/63 | HR 76 | Temp 97.0°F | Resp 16

## 2020-02-17 DIAGNOSIS — D45 Polycythemia vera: Secondary | ICD-10-CM | POA: Diagnosis not present

## 2020-02-17 MED ORDER — SODIUM CHLORIDE 0.9 % IV SOLN
Freq: Once | INTRAVENOUS | Status: AC
  Filled 2020-02-17: qty 250

## 2020-02-17 NOTE — Progress Notes (Signed)
568ml NS infusion over 4 hours per orders - well tolerated. Discharged in stable condition with daughter.

## 2020-02-21 ENCOUNTER — Telehealth: Payer: Self-pay

## 2020-02-21 ENCOUNTER — Ambulatory Visit: Payer: Medicare Other

## 2020-02-22 ENCOUNTER — Other Ambulatory Visit: Payer: Self-pay

## 2020-02-22 ENCOUNTER — Inpatient Hospital Stay: Payer: Medicare Other

## 2020-02-22 DIAGNOSIS — D649 Anemia, unspecified: Secondary | ICD-10-CM | POA: Diagnosis not present

## 2020-02-22 DIAGNOSIS — R5383 Other fatigue: Secondary | ICD-10-CM | POA: Diagnosis not present

## 2020-02-22 DIAGNOSIS — D45 Polycythemia vera: Secondary | ICD-10-CM

## 2020-02-22 DIAGNOSIS — F0390 Unspecified dementia without behavioral disturbance: Secondary | ICD-10-CM | POA: Diagnosis not present

## 2020-02-22 DIAGNOSIS — N289 Disorder of kidney and ureter, unspecified: Secondary | ICD-10-CM | POA: Diagnosis not present

## 2020-02-22 DIAGNOSIS — N1832 Chronic kidney disease, stage 3b: Secondary | ICD-10-CM

## 2020-02-22 DIAGNOSIS — D631 Anemia in chronic kidney disease: Secondary | ICD-10-CM

## 2020-02-22 DIAGNOSIS — R413 Other amnesia: Secondary | ICD-10-CM | POA: Diagnosis not present

## 2020-02-22 MED ORDER — SODIUM CHLORIDE 0.9 % IV SOLN
Freq: Once | INTRAVENOUS | Status: AC
  Filled 2020-02-22: qty 250

## 2020-02-22 NOTE — Progress Notes (Signed)
Pt received prescribed treatment in clinic, pt stable at d/c. 

## 2020-02-23 ENCOUNTER — Inpatient Hospital Stay: Payer: Medicare Other

## 2020-02-23 ENCOUNTER — Telehealth: Payer: Self-pay

## 2020-02-23 DIAGNOSIS — F0390 Unspecified dementia without behavioral disturbance: Secondary | ICD-10-CM | POA: Diagnosis not present

## 2020-02-23 DIAGNOSIS — D45 Polycythemia vera: Secondary | ICD-10-CM

## 2020-02-23 DIAGNOSIS — N289 Disorder of kidney and ureter, unspecified: Secondary | ICD-10-CM | POA: Diagnosis not present

## 2020-02-23 DIAGNOSIS — N1832 Chronic kidney disease, stage 3b: Secondary | ICD-10-CM

## 2020-02-23 DIAGNOSIS — R413 Other amnesia: Secondary | ICD-10-CM | POA: Diagnosis not present

## 2020-02-23 DIAGNOSIS — D649 Anemia, unspecified: Secondary | ICD-10-CM | POA: Diagnosis not present

## 2020-02-23 DIAGNOSIS — R5383 Other fatigue: Secondary | ICD-10-CM | POA: Diagnosis not present

## 2020-02-23 LAB — BASIC METABOLIC PANEL
Anion gap: 8 (ref 5–15)
BUN: 26 mg/dL — ABNORMAL HIGH (ref 8–23)
CO2: 26 mmol/L (ref 22–32)
Calcium: 8.9 mg/dL (ref 8.9–10.3)
Chloride: 103 mmol/L (ref 98–111)
Creatinine, Ser: 1.01 mg/dL — ABNORMAL HIGH (ref 0.44–1.00)
GFR, Estimated: 53 mL/min — ABNORMAL LOW (ref >60)
Glucose, Bld: 124 mg/dL — ABNORMAL HIGH (ref 70–99)
Potassium: 3.7 mmol/L (ref 3.5–5.1)
Sodium: 137 mmol/L (ref 135–145)

## 2020-02-23 LAB — CBC WITH DIFFERENTIAL/PLATELET
Abs Immature Granulocytes: 0.17 10*3/uL — ABNORMAL HIGH (ref 0.00–0.07)
Basophils Absolute: 0.2 10*3/uL — ABNORMAL HIGH (ref 0.0–0.1)
Basophils Relative: 2 %
Eosinophils Absolute: 0.5 10*3/uL (ref 0.0–0.5)
Eosinophils Relative: 6 %
HCT: 34.8 % — ABNORMAL LOW (ref 36.0–46.0)
Hemoglobin: 10.6 g/dL — ABNORMAL LOW (ref 12.0–15.0)
Immature Granulocytes: 2 %
Lymphocytes Relative: 13 %
Lymphs Abs: 1.2 10*3/uL (ref 0.7–4.0)
MCH: 28.6 pg (ref 26.0–34.0)
MCHC: 30.5 g/dL (ref 30.0–36.0)
MCV: 93.8 fL (ref 80.0–100.0)
Monocytes Absolute: 0.7 10*3/uL (ref 0.1–1.0)
Monocytes Relative: 8 %
Neutro Abs: 6 10*3/uL (ref 1.7–7.7)
Neutrophils Relative %: 69 %
Platelets: 539 10*3/uL — ABNORMAL HIGH (ref 150–400)
RBC: 3.71 MIL/uL — ABNORMAL LOW (ref 3.87–5.11)
RDW: 21.5 % — ABNORMAL HIGH (ref 11.5–15.5)
WBC: 8.8 10*3/uL (ref 4.0–10.5)
nRBC: 0 % (ref 0.0–0.2)

## 2020-02-23 NOTE — Telephone Encounter (Signed)
Patient aware.

## 2020-02-23 NOTE — Telephone Encounter (Signed)
-----   Message from Lequita Asal, MD sent at 02/23/2020  4:38 PM EST ----- Regarding: Please call patient  Platelet count better.  ----- Message ----- From: Buel Ream, Lab In [place] Sent: 02/23/2020   3:28 PM EST To: Lequita Asal, MD

## 2020-03-06 ENCOUNTER — Other Ambulatory Visit: Payer: Self-pay

## 2020-03-06 DIAGNOSIS — N1832 Chronic kidney disease, stage 3b: Secondary | ICD-10-CM

## 2020-03-06 DIAGNOSIS — D631 Anemia in chronic kidney disease: Secondary | ICD-10-CM

## 2020-03-06 DIAGNOSIS — D45 Polycythemia vera: Secondary | ICD-10-CM

## 2020-03-07 NOTE — Progress Notes (Signed)
[facility]  [address], [address] [place], [place] [address] Phone: [phone]  Fax: [phone]   Clinic Day:  03/08/2020   Referring physician: No ref. provider found  Chief Complaint: Laura Wilcox is a 86 y.o. female with polycythemia rubra vera (PV)on Jakafi who is seen for 5 week assessment.   HPI: The patient was last seen in the hematology clinic on 02/02/2020. At that time, she had been "sleepy".  She had a great week the week before; she was quite active. That week, she was tired again and took 2-3 naps per day.  She had been drinking more water. She had missed a couple doses of Jakafi. Hematocrit was 33.4, hemoglobin 10.3, MCV 95.2, platelets 741,000, WBC 10,700 (ANC 6,500). Sodium was 134. Chloride was 97. BUN was 27. Creatinine was 1.14 (CrCl 45 ml/min).  Labs followed: 02/09/2020: Hematocrit 34.4, hemoglobin 10.6, MCV 94.0, platelets 754,000, WBC 9,900. BUN 30. Creatinine 1.25 (CrCl 41 ml/min). Chloride 97. 02/23/2020: Hematocrit 34.8, hemoglobin 10.6, MCV 93.8, platelets 539,000, WBC 8,800. BUN 26. Creatinine 1.01 (CrCl 53 ml/min).  She received IVF on 02/16/2020, 02/17/2020, and 02/22/2020.  During the interim, she has been "sleepy." She naps for 1-2 hours after breakfast and in the afternoon. Her daughter had a virus for 8 days after Thanksgiving so she did not visit the patient. They are getting back to leaving the house regularly for outings.  The patient has not been keeping track of her water intake. The patient is not sure how many times she urinates throughout the day.  The patient missed one dose of Jakafi on Sunday night.  The patient's daughter has noticed more "loopiness" such as repeating questions multiple times.   Past Medical History:  Diagnosis Date  . Confusion 12/30/2018  . Polycythemia   . Urinary tract infection without hematuria 01/02/2020  . Urticaria 06/02/2017    Past Surgical History:  Procedure Laterality Date   . ABDOMINAL HYSTERECTOMY     complete  . BREAST SURGERY      Family History  Problem Relation Age of Onset  . Heart disease Father   . Cancer Sister        breast  . Cancer Sister        breast  . Alzheimer's disease Sister     Social History:  reports that she quit smoking about 41 years ago. She has a 30.00 pack-year smoking history. She has never used smokeless tobacco. She reports that she does not drink alcohol and does not use drugs. She is widowed. Patient is retired Sport and exercise psychologist. Patient moved to [facility] in 03/2017. She lives independently in her own apartment in [place]. Her daughter lives next door.Patient denies known exposures to radiation on toxins.Pt's daughter, [name] be reached at [phone]. She lives in [place].  The patient is accompanied by her daughter today.   Allergies: No Known Allergies  Current Medications: Current Outpatient Medications  Medication Sig Dispense Refill  . calcitRIOL (ROCALTROL) 0.25 MCG capsule Take 0.25 mcg by mouth 3 (three) times a week.     . ruxolitinib phosphate (JAKAFI) 10 MG tablet Take 1 tablet (10 mg total) by mouth daily. Take in AM. 30 tablet 0  . ruxolitinib phosphate (JAKAFI) 5 MG tablet Take 5 mg by mouth daily. Take in PM     No current facility-administered medications for this visit.    Review of Systems  Constitutional: Positive for malaise/fatigue (Takes 1-2 naps daily) and weight loss (2 lbs). Negative for chills, diaphoresis and  fever.       Feels "sleepy."  HENT: Positive for hearing loss. Negative for congestion, ear discharge, ear pain, nosebleeds, sinus pain, sore throat and tinnitus.   Eyes: Negative.  Negative for blurred vision.  Respiratory: Negative.  Negative for cough, hemoptysis, sputum production and shortness of breath.   Cardiovascular: Negative.  Negative for chest pain, palpitations and leg swelling.  Gastrointestinal: Negative.  Negative for abdominal pain, blood in stool, constipation, diarrhea,  heartburn, melena, nausea and vomiting.  Genitourinary: Negative.  Negative for dysuria, frequency, hematuria and urgency.  Musculoskeletal: Negative.  Negative for back pain, joint pain, myalgias and neck pain.  Skin: Negative.  Negative for itching and rash.  Neurological: Negative.  Negative for dizziness, tingling, sensory change, weakness and headaches.  Endo/Heme/Allergies: Negative.  Does not bruise/bleed easily.  Psychiatric/Behavioral: Positive for memory loss. Negative for depression. The patient is not nervous/anxious and does not have insomnia.   All other systems reviewed and are negative.  Performance status (ECOG): 2  Vitals Blood pressure 111/61, pulse 78, temperature 98 F (36.7 C), temperature source Tympanic, weight 108 lb 14.5 oz (49.4 kg), SpO2 98 %.   Physical Exam Vitals and nursing note reviewed.  Constitutional:      General: She is not in acute distress.    Appearance: She is not diaphoretic.     Interventions: Face mask in place.  HENT:     Head: Normocephalic and atraumatic.     Comments: Short dark blonde hair.    Mouth/Throat:     Mouth: Mucous membranes are moist.     Pharynx: Oropharynx is clear.  Eyes:     General: No scleral icterus.    Extraocular Movements: Extraocular movements intact.     Conjunctiva/sclera: Conjunctivae normal.     Pupils: Pupils are equal, round, and reactive to light.     Comments: Blue eyes.  Cardiovascular:     Rate and Rhythm: Normal rate and regular rhythm.     Heart sounds: Normal heart sounds. No murmur heard.   Pulmonary:     Effort: Pulmonary effort is normal. No respiratory distress.     Breath sounds: Normal breath sounds. No wheezing or rales.  Chest:     Chest wall: No tenderness.  Abdominal:     General: Bowel sounds are normal. There is no distension.     Palpations: Abdomen is soft. There is no hepatomegaly, splenomegaly or mass.     Tenderness: There is no abdominal tenderness. There is no guarding  or rebound.  Musculoskeletal:        General: No swelling or tenderness. Normal range of motion.     Cervical back: Normal range of motion and neck supple.  Lymphadenopathy:     Head:     Right side of head: No preauricular, posterior auricular or occipital adenopathy.     Left side of head: No preauricular, posterior auricular or occipital adenopathy.     Cervical: No cervical adenopathy.     Upper Body:     Right upper body: No supraclavicular or axillary adenopathy.     Left upper body: No supraclavicular or axillary adenopathy.     Lower Body: No right inguinal adenopathy. No left inguinal adenopathy.  Skin:    General: Skin is warm and dry.  Neurological:     Mental Status: She is alert and oriented to person, place, and time. Mental status is at baseline.  Psychiatric:        Behavior: Behavior normal.  Thought Content: Thought content normal.      01/04/2020     Appointment on 03/08/2020  Component Date Value Ref Range Status  . Sodium 03/08/2020 137  135 - 145 mmol/L Final  . Potassium 03/08/2020 4.5  3.5 - 5.1 mmol/L Final  . Chloride 03/08/2020 103  98 - 111 mmol/L Final  . CO2 03/08/2020 24  22 - 32 mmol/L Final  . Glucose, Bld 03/08/2020 97  70 - 99 mg/dL Final   Glucose reference range applies only to samples taken after fasting for at least 8 hours.  . BUN 03/08/2020 32* 8 - 23 mg/dL Final  . Creatinine, Ser 03/08/2020 1.49* 0.44 - 1.00 mg/dL Final  . Calcium 03/08/2020 9.2  8.9 - 10.3 mg/dL Final  . Total Protein 03/08/2020 7.2  6.5 - 8.1 g/dL Final  . Albumin 03/08/2020 4.3  3.5 - 5.0 g/dL Final  . AST 03/08/2020 18  15 - 41 U/L Final  . ALT 03/08/2020 12  0 - 44 U/L Final  . Alkaline Phosphatase 03/08/2020 75  38 - 126 U/L Final  . Total Bilirubin 03/08/2020 0.3  0.3 - 1.2 mg/dL Final  . GFR, Estimated 03/08/2020 33* >60 mL/min Final   Comment: (NOTE) Calculated using the CKD-EPI Creatinine Equation (2021)   . Anion gap 03/08/2020 10  5 - 15  Final   Performed at [facility], [address] [place], [place] [address]  . WBC 03/08/2020 9.4  4.0 - 10.5 K/uL Final  . RBC 03/08/2020 3.90  3.87 - 5.11 MIL/uL Final  . Hemoglobin 03/08/2020 11.1* 12.0 - 15.0 g/dL Final  . HCT 03/08/2020 35.8* 36 - 46 % Final  . MCV 03/08/2020 91.8  80.0 - 100.0 fL Final  . MCH 03/08/2020 28.5  26.0 - 34.0 pg Final  . MCHC 03/08/2020 31.0  30.0 - 36.0 g/dL Final  . RDW 03/08/2020 21.2* 11.5 - 15.5 % Final  . Platelets 03/08/2020 643* 150 - 400 K/uL Final  . nRBC 03/08/2020 0.0  0.0 - 0.2 % Final  . Neutrophils Relative % 03/08/2020 61  % Final  . Neutro Abs 03/08/2020 5.7  1.7 - 7.7 K/uL Final  . Lymphocytes Relative 03/08/2020 19  % Final  . Lymphs Abs 03/08/2020 1.8  0.7 - 4.0 K/uL Final  . Monocytes Relative 03/08/2020 10  % Final  . Monocytes Absolute 03/08/2020 1.0  0.1 - 1.0 K/uL Final  . Eosinophils Relative 03/08/2020 5  % Final  . Eosinophils Absolute 03/08/2020 0.4  0.0 - 0.5 K/uL Final  . Basophils Relative 03/08/2020 2  % Final  . Basophils Absolute 03/08/2020 0.2* 0.0 - 0.1 K/uL Final  . Immature Granulocytes 03/08/2020 3  % Final  . Abs Immature Granulocytes 03/08/2020 0.31* 0.00 - 0.07 K/uL Final   Performed at [facility], [address] [place], [place] [address]    Assessment:  Laura Wilcox is a 86 y.o. female with polycythemia rubra veraand secondary myelofibrosis. She presented in 04/2002 with thrombocytosis (835,000). JAK2 V617Fwas positive on 05/20/2012.  Over the past year,hematocrithas ranged 37.5 - 42.4,hemoglobin12.1 - 13.4, platelets458,000 - 569,000, WBC10,500 - 15,700. Creatinine has been 1.0 - 1.2.  Bone marrowon 04/15/2002 revealed a normocellular marrow for age with trilineage hematopoiesis and mild megakaryocytic hyperplasia with focal atypia. There was no morphologic evidence of a myeloproliferative disorder. Iron stains were inadequate for evaluation of storage  iron. There was no increase in reticulin fibers. She has been on agrylin since 2012.  Bone marrowon 09/25/2012 revealed a persistent myeloproliferative disorder s/p Agrylin. The current features were best classified as persistent myeloproliferative neoplasm (MPN) JAK2 V617F mutation positive. Marrow was hypercellular for age (30-50%) with increased trilineage hematopoiesis and markedly increased atypical megakaryopoiesis with frequent clustering. There were no increased blasts. Iron stores were absent. There was moderate to severe myelofibrosis(grade MF 2-3). Differential included ET, PV, primary myelofibrosis, and MPN, unclassifiable. Cytogeneticswere normal (91, XX).  She developed pancytopeniain 12/2015. Etiology is unclear (medication confusion or hydroxyurea + Agrylin).  She received Procrit40,000 units (intermittently 09/2012 - 06/2016), Venofer(09/2012 - 10/2012), and Infed(12/2015 - 03/2016). She received Neupogenduring a period of neutropenia. She was anagrelidefrom 2004 - 08/13/2017. She beganJakafion 08/14/2017. Jakafi was increased from 5 mg BID to 10 mg BID on03/18/2020.Jakafi was increased to 15 mg BID on 08/03/2018.She has been back on Jakafi 5 mg BID since 02/01/2019 then 10 mg BID on 05/25/2019.  [name] was on hold from 12/20/2019 - 01/12/2020.  Jakafi 5 mg twice daily restarted on 01/12/2020 then increased to 10 mg in the morning and 5 mg in the evening on 02/02/2020.  She began a phlebotomy programon 08/06/2017. She undergoes small volume (150 cc with replacement of 150 cc NS)if her HCT >42.  Ferritinhas been followed: 529 on 09/03/2017, 1020 on 12/03/2017, 423 on 02/18/2018, 858 on 11/17/2018, 785 on 09/23/2019 and 728 on 12/20/2019.Iron saturationwas 37%and TIBC 270 on08/18/2020.B12 was 477 and folate 36 on 11/17/2018.  B12 was 292 and folate 19.1 on 12/20/2019  She was admitted to [facility] 12/17/2018 - 12/18/2018. She received 1 unit  of PRBCs.CBC at discharge included a hematocrit of 29.0, hemoglobin 9.4, platelets 364,000, and WBC 10,200.  She received a course of Keflexfor a Proteus mirabilisUTIbeginning 01/11/2019.  Urine culture on 12/27/2019 revealed greater than 100,000 colonies of Enterobacter cloacae.  She was treated with Septra secondary to sensitivities.  The patient has received both doses of the COVID-19 vaccine.  Symptomatically, she feels "sleepy".  She does not drink much fluid on her own.  BUN is 32 and creatinine 1.49 (above baseline).  Plan: 1.   Labs today: CBC with diff, CMP. 2.Polycythemia rubra vera             Hematocrit 27.3.  Hemoglobin   8.5.  Platelets 562,000 on 12/20/2019.  Hematocrit28.3.Hemoglobin  8.5.Platelets451,000 on 01/04/2020. Hematocrit 31.6. Hemoglobin  9.3.Platelets 620,000 on 01/12/2020.  Hematocrit 33.4.  Hemoglobin 10.3.  Platelets 741,000 on 02/02/2020.  Hematocrit 35.8.  Hemoglobin 11.1.  Platelets 643,000 on 03/08/2020.   Platelet count goal < 400,000. Jakafi 5 mg BID restarted on 01/12/2020 then switched to 10 mg in the morning and 5 mg in the evening on 02/02/2020. Discuss plans to return to [employer] 5 mg twice daily secondary to renal insufficiency. 3.Renal insufficiency BUN 32 and creatinine 1.49 today. When left on her own, the patient decreased her fluid intake. Encourage reinstitution of the water chart.   Goal remains at 5 glasses/day Patient's daughter would like to pursue IV fluids. 4.Anemia             Hematocrit ready 35.8.  Hemoglobin 11.1. MCV 91.8.               Ferritin 728 with an iron saturation of 26% and a TIBC 223 on 12/20/2019.             B12 was 292 with a folate of 19.1 on 12/20/2019.              B12 goal is 400.  Jakafi restarted on 01/12/2020.             She received Retacrit on 12/24/2019.  Continue Retacrit every 2 weeks per protocol. 5.   Dementia             She appears to have  underlying dementia.             She declined neurology consultation. 6.   Left cheek lesion  Lesion appears to be a squamous cell skin cancer.  Follow-up with dermatology as scheduled. 7.   Dehydration  BUN 26.  Creatinine 1.01 on 02/23/2020.  BUN 32.  Creatinine 1.49 on 03/08/2020.  Patient has not met fluid goals over the past 10 days following her daughter's illness.  Discussed the importance of oral intake. 8.   RTC tomorrow in [place] for IVF (125 cc/hrs x 4 hours). 9.   RTC on Friday in [place] for IVF (125 cc/hrs/hrs x 4 hours). 10.   RTC on Tuesday - Thursday next week in [place] for IVF as above. 11.   RTC on 12/15 for MD assessment, labs (CBC with diff, BMP), and IVF.  I discussed the assessment and treatment plan with the patient.  The patient was provided an opportunity to ask questions and all were answered.  The patient agreed with the plan and demonstrated an understanding of the instructions.  The patient was advised to call back if the symptoms worsen or if the condition fails to improve as anticipated.   Lequita Asal, MD, PhD    03/08/2020, 4:41 PM   I, Mirian Mo Tufford, am acting as Education administrator for [employer]. Mike Gip, MD, PhD.  I, Kamirah Shugrue C. Mike Gip, MD, have reviewed the above documentation for accuracy and completeness, and I agree with the above.

## 2020-03-08 ENCOUNTER — Encounter: Payer: Self-pay | Admitting: Hematology and Oncology

## 2020-03-08 ENCOUNTER — Inpatient Hospital Stay: Payer: Medicare Other | Attending: Hematology and Oncology | Admitting: Hematology and Oncology

## 2020-03-08 ENCOUNTER — Inpatient Hospital Stay: Payer: Medicare Other

## 2020-03-08 ENCOUNTER — Other Ambulatory Visit: Payer: Self-pay

## 2020-03-08 VITALS — BP 111/61 | HR 78 | Temp 98.0°F | Wt 108.9 lb

## 2020-03-08 DIAGNOSIS — D649 Anemia, unspecified: Secondary | ICD-10-CM | POA: Insufficient documentation

## 2020-03-08 DIAGNOSIS — N1832 Chronic kidney disease, stage 3b: Secondary | ICD-10-CM

## 2020-03-08 DIAGNOSIS — H9190 Unspecified hearing loss, unspecified ear: Secondary | ICD-10-CM | POA: Diagnosis not present

## 2020-03-08 DIAGNOSIS — Z87891 Personal history of nicotine dependence: Secondary | ICD-10-CM | POA: Insufficient documentation

## 2020-03-08 DIAGNOSIS — Z803 Family history of malignant neoplasm of breast: Secondary | ICD-10-CM | POA: Insufficient documentation

## 2020-03-08 DIAGNOSIS — E860 Dehydration: Secondary | ICD-10-CM | POA: Diagnosis not present

## 2020-03-08 DIAGNOSIS — D631 Anemia in chronic kidney disease: Secondary | ICD-10-CM | POA: Diagnosis not present

## 2020-03-08 DIAGNOSIS — D45 Polycythemia vera: Secondary | ICD-10-CM

## 2020-03-08 DIAGNOSIS — Z79899 Other long term (current) drug therapy: Secondary | ICD-10-CM | POA: Insufficient documentation

## 2020-03-08 DIAGNOSIS — F0390 Unspecified dementia without behavioral disturbance: Secondary | ICD-10-CM | POA: Insufficient documentation

## 2020-03-08 DIAGNOSIS — R4189 Other symptoms and signs involving cognitive functions and awareness: Secondary | ICD-10-CM

## 2020-03-08 DIAGNOSIS — Z8249 Family history of ischemic heart disease and other diseases of the circulatory system: Secondary | ICD-10-CM | POA: Diagnosis not present

## 2020-03-08 DIAGNOSIS — Z818 Family history of other mental and behavioral disorders: Secondary | ICD-10-CM | POA: Insufficient documentation

## 2020-03-08 DIAGNOSIS — N289 Disorder of kidney and ureter, unspecified: Secondary | ICD-10-CM | POA: Insufficient documentation

## 2020-03-08 DIAGNOSIS — R5383 Other fatigue: Secondary | ICD-10-CM | POA: Diagnosis not present

## 2020-03-08 LAB — COMPREHENSIVE METABOLIC PANEL
ALT: 12 U/L (ref 0–44)
AST: 18 U/L (ref 15–41)
Albumin: 4.3 g/dL (ref 3.5–5.0)
Alkaline Phosphatase: 75 U/L (ref 38–126)
Anion gap: 10 (ref 5–15)
BUN: 32 mg/dL — ABNORMAL HIGH (ref 8–23)
CO2: 24 mmol/L (ref 22–32)
Calcium: 9.2 mg/dL (ref 8.9–10.3)
Chloride: 103 mmol/L (ref 98–111)
Creatinine, Ser: 1.49 mg/dL — ABNORMAL HIGH (ref 0.44–1.00)
GFR, Estimated: 33 mL/min — ABNORMAL LOW (ref >60)
Glucose, Bld: 97 mg/dL (ref 70–99)
Potassium: 4.5 mmol/L (ref 3.5–5.1)
Sodium: 137 mmol/L (ref 135–145)
Total Bilirubin: 0.3 mg/dL (ref 0.3–1.2)
Total Protein: 7.2 g/dL (ref 6.5–8.1)

## 2020-03-08 LAB — CBC WITH DIFFERENTIAL/PLATELET
Abs Immature Granulocytes: 0.31 10*3/uL — ABNORMAL HIGH (ref 0.00–0.07)
Basophils Absolute: 0.2 10*3/uL — ABNORMAL HIGH (ref 0.0–0.1)
Basophils Relative: 2 %
Eosinophils Absolute: 0.4 10*3/uL (ref 0.0–0.5)
Eosinophils Relative: 5 %
HCT: 35.8 % — ABNORMAL LOW (ref 36.0–46.0)
Hemoglobin: 11.1 g/dL — ABNORMAL LOW (ref 12.0–15.0)
Immature Granulocytes: 3 %
Lymphocytes Relative: 19 %
Lymphs Abs: 1.8 10*3/uL (ref 0.7–4.0)
MCH: 28.5 pg (ref 26.0–34.0)
MCHC: 31 g/dL (ref 30.0–36.0)
MCV: 91.8 fL (ref 80.0–100.0)
Monocytes Absolute: 1 10*3/uL (ref 0.1–1.0)
Monocytes Relative: 10 %
Neutro Abs: 5.7 10*3/uL (ref 1.7–7.7)
Neutrophils Relative %: 61 %
Platelets: 643 10*3/uL — ABNORMAL HIGH (ref 150–400)
RBC: 3.9 MIL/uL (ref 3.87–5.11)
RDW: 21.2 % — ABNORMAL HIGH (ref 11.5–15.5)
WBC: 9.4 10*3/uL (ref 4.0–10.5)
nRBC: 0 % (ref 0.0–0.2)

## 2020-03-09 ENCOUNTER — Other Ambulatory Visit: Payer: Self-pay | Admitting: Hematology and Oncology

## 2020-03-09 ENCOUNTER — Inpatient Hospital Stay: Payer: Medicare Other

## 2020-03-09 VITALS — BP 126/70 | HR 80 | Temp 97.1°F

## 2020-03-09 DIAGNOSIS — N289 Disorder of kidney and ureter, unspecified: Secondary | ICD-10-CM | POA: Diagnosis not present

## 2020-03-09 DIAGNOSIS — D649 Anemia, unspecified: Secondary | ICD-10-CM | POA: Diagnosis not present

## 2020-03-09 DIAGNOSIS — D45 Polycythemia vera: Secondary | ICD-10-CM

## 2020-03-09 DIAGNOSIS — F0390 Unspecified dementia without behavioral disturbance: Secondary | ICD-10-CM | POA: Diagnosis not present

## 2020-03-09 DIAGNOSIS — H9190 Unspecified hearing loss, unspecified ear: Secondary | ICD-10-CM | POA: Diagnosis not present

## 2020-03-09 DIAGNOSIS — R5383 Other fatigue: Secondary | ICD-10-CM | POA: Diagnosis not present

## 2020-03-09 MED ORDER — SODIUM CHLORIDE 0.9 % IV SOLN
Freq: Once | INTRAVENOUS | Status: AC
  Filled 2020-03-09: qty 250

## 2020-03-09 NOTE — Progress Notes (Signed)
Pt administered IVF over 4 hours as ordered by Dr. Mike Gip.  Pt tolerated infusion well.  Left infusion suite stable and ambulatory

## 2020-03-10 ENCOUNTER — Other Ambulatory Visit: Payer: Self-pay

## 2020-03-10 ENCOUNTER — Inpatient Hospital Stay: Payer: Medicare Other | Attending: Hematology and Oncology

## 2020-03-10 VITALS — BP 160/73 | HR 67 | Temp 97.5°F | Resp 16

## 2020-03-10 DIAGNOSIS — N1832 Chronic kidney disease, stage 3b: Secondary | ICD-10-CM | POA: Diagnosis not present

## 2020-03-10 DIAGNOSIS — D45 Polycythemia vera: Secondary | ICD-10-CM | POA: Diagnosis not present

## 2020-03-10 DIAGNOSIS — D631 Anemia in chronic kidney disease: Secondary | ICD-10-CM | POA: Insufficient documentation

## 2020-03-10 MED ORDER — SODIUM CHLORIDE 0.9 % IV SOLN
Freq: Once | INTRAVENOUS | Status: AC
  Filled 2020-03-10: qty 250

## 2020-03-10 NOTE — Progress Notes (Signed)
Patient tolerated infusion well. Denies any s/s at this time. Discharged home.

## 2020-03-12 DIAGNOSIS — E860 Dehydration: Secondary | ICD-10-CM | POA: Insufficient documentation

## 2020-03-14 ENCOUNTER — Inpatient Hospital Stay: Payer: Medicare Other

## 2020-03-14 ENCOUNTER — Other Ambulatory Visit: Payer: Self-pay

## 2020-03-14 VITALS — BP 120/63 | HR 76 | Temp 97.5°F | Resp 18

## 2020-03-14 DIAGNOSIS — D45 Polycythemia vera: Secondary | ICD-10-CM

## 2020-03-14 DIAGNOSIS — N289 Disorder of kidney and ureter, unspecified: Secondary | ICD-10-CM | POA: Insufficient documentation

## 2020-03-14 DIAGNOSIS — F0390 Unspecified dementia without behavioral disturbance: Secondary | ICD-10-CM | POA: Diagnosis not present

## 2020-03-14 DIAGNOSIS — R5383 Other fatigue: Secondary | ICD-10-CM | POA: Diagnosis not present

## 2020-03-14 DIAGNOSIS — H9190 Unspecified hearing loss, unspecified ear: Secondary | ICD-10-CM | POA: Diagnosis not present

## 2020-03-14 DIAGNOSIS — D649 Anemia, unspecified: Secondary | ICD-10-CM | POA: Diagnosis not present

## 2020-03-14 MED ORDER — SODIUM CHLORIDE 0.9 % IV SOLN
Freq: Once | INTRAVENOUS | Status: AC
  Filled 2020-03-14: qty 250

## 2020-03-14 NOTE — Progress Notes (Signed)
Pt received prescribed treatment in clinic, pt stable at d/c. 

## 2020-03-14 NOTE — Progress Notes (Signed)
[facility]  [address] [place], [place] [address] Phone: [phone]  Fax: [phone]   Clinic Day:  03/15/2020   Referring physician: Glean Hess, MD  Chief Complaint: Laura Wilcox is a 86 y.o. female with polycythemia rubra vera (PV)on [name] who is seen for 1 week assessment.   HPI: The patient was last seen in the hematology clinic on 03/08/2020. At that time, she felt "sleepy".  She did not drink much fluid on her own. Hematocrit was 35.8, hemoglobin 11.1, MCV 91.8, platelets 643,000, WBC 9,400. BUN was 32. Creatinine was 1.49 (CrCl 33 ml/min).  Jakafi was decreased to 5 mg BID.  She received IV fluids on 03/09/2020, 03/10/2020, and 03/14/2020.  During the interim, she has been "good". The patient had some back pain this morning in her kidney area. She denies urgency, frequency, and burning.  She missed one Jakafi pill on Sunday, Monday, and Tuesday. The patient's daughter called her every night and she said she took the [place], but when her daughter arrived in the morning, the pills were still in her pill container. Sometimes the patient puts the pills back in the bottle.  The patient declines a neurology consult. The patient's daughter would like a referral.   She had 5 glasses of water yesterday. On Sunday and Monday, she drank 4 glasses. The patient declines IV fluids today.   Past Medical History:  Diagnosis Date  . Confusion 12/30/2018  . Polycythemia   . Urinary tract infection without hematuria 01/02/2020  . Urticaria 06/02/2017    Past Surgical History:  Procedure Laterality Date  . ABDOMINAL HYSTERECTOMY     complete  . BREAST SURGERY      Family History  Problem Relation Age of Onset  . Heart disease Father   . Cancer Sister        breast  . Cancer Sister        breast  . Alzheimer's disease Sister     Social History:  reports that she quit smoking about 41 years ago. She has a 30.00 pack-year smoking  history. She has never used smokeless tobacco. She reports that she does not drink alcohol and does not use drugs. She is widowed. Patient is retired Sport and exercise psychologist. Patient moved to [facility] in 03/2017. She lives independently in her own apartment in [place]. Her daughter lives next door.Patient denies known exposures to radiation on toxins.Pt's daughter, [name] be reached at [phone]. She lives in [place].  The patient is accompanied by her daughter today.   Allergies: No Known Allergies  Current Medications: Current Outpatient Medications  Medication Sig Dispense Refill  . acetaminophen (TYLENOL) 325 MG tablet Take 650 mg by mouth every 6 (six) hours as needed.    . calcitRIOL (ROCALTROL) 0.25 MCG capsule Take 0.25 mcg by mouth 3 (three) times a week.     Marland Kitchen JAKAFI 5 MG tablet TAKE 1 TABLET (5 MG TOTAL) BY MOUTH 2 (TWO) TIMES DAILY. 60 tablet 0  . ruxolitinib phosphate (JAKAFI) 10 MG tablet Take 1 tablet (10 mg total) by mouth daily. Take in AM. 30 tablet 0   No current facility-administered medications for this visit.    Review of Systems  Constitutional: Negative for chills, diaphoresis, fever, malaise/fatigue and weight loss (up 4 lbs).  HENT: Positive for hearing loss. Negative for congestion, ear discharge, ear pain, nosebleeds, sinus pain, sore throat and tinnitus.   Eyes: Negative.  Negative for blurred vision.  Respiratory: Negative.  Negative for cough, hemoptysis,  sputum production and shortness of breath.   Cardiovascular: Negative.  Negative for chest pain, palpitations and leg swelling.  Gastrointestinal: Negative.  Negative for abdominal pain, blood in stool, constipation, diarrhea, heartburn, melena, nausea and vomiting.  Genitourinary: Negative.  Negative for dysuria, frequency, hematuria and urgency.  Musculoskeletal: Negative for back pain (this morning, resolved), joint pain, myalgias and neck pain.  Skin: Negative.  Negative for itching and rash.  Neurological: Negative.   Negative for dizziness, tingling, sensory change, weakness and headaches.  Endo/Heme/Allergies: Negative.  Does not bruise/bleed easily.  Psychiatric/Behavioral: Positive for memory loss. Negative for depression. The patient is not nervous/anxious and does not have insomnia.   All other systems reviewed and are negative.  Performance status (ECOG):  2  Vitals Blood pressure (!) 138/56, pulse 88, temperature 97.7 F (36.5 C), temperature source Tympanic, resp. rate 16, weight 112 lb 1.7 oz (50.8 kg), SpO2 96 %.   Physical Exam Vitals and nursing note reviewed.  Constitutional:      General: She is not in acute distress.    Appearance: She is not diaphoretic.     Interventions: Face mask in place.  HENT:     Head: Normocephalic and atraumatic.     Comments: Short dark blonde hair.    Mouth/Throat:     Mouth: Mucous membranes are moist.     Pharynx: Oropharynx is clear.  Eyes:     General: No scleral icterus.    Extraocular Movements: Extraocular movements intact.     Conjunctiva/sclera: Conjunctivae normal.     Pupils: Pupils are equal, round, and reactive to light.     Comments: Blue eyes.  Cardiovascular:     Rate and Rhythm: Normal rate and regular rhythm.     Heart sounds: Normal heart sounds. No murmur heard.   Pulmonary:     Effort: Pulmonary effort is normal. No respiratory distress.     Breath sounds: Normal breath sounds. No wheezing or rales.  Chest:     Chest wall: No tenderness.  Breasts:     Right: No axillary adenopathy or supraclavicular adenopathy.     Left: No axillary adenopathy or supraclavicular adenopathy.    Abdominal:     General: Bowel sounds are normal. There is no distension.     Palpations: Abdomen is soft. There is no hepatomegaly, splenomegaly or mass.     Tenderness: There is no abdominal tenderness. There is no guarding or rebound.  Musculoskeletal:        General: No swelling or tenderness. Normal range of motion.     Cervical back:  Normal range of motion and neck supple.  Lymphadenopathy:     Head:     Right side of head: No preauricular, posterior auricular or occipital adenopathy.     Left side of head: No preauricular, posterior auricular or occipital adenopathy.     Cervical: No cervical adenopathy.     Upper Body:     Right upper body: No supraclavicular or axillary adenopathy.     Left upper body: No supraclavicular or axillary adenopathy.     Lower Body: No right inguinal adenopathy. No left inguinal adenopathy.  Skin:    General: Skin is warm and dry.  Neurological:     Mental Status: She is alert and oriented to person, place, and time. Mental status is at baseline.  Psychiatric:        Behavior: Behavior normal.        Judgment: Judgment normal.  01/04/2020     Appointment on 03/15/2020  Component Date Value Ref Range Status  . Sodium 03/15/2020 138  135 - 145 mmol/L Final  . Potassium 03/15/2020 3.6  3.5 - 5.1 mmol/L Final  . Chloride 03/15/2020 104  98 - 111 mmol/L Final  . CO2 03/15/2020 26  22 - 32 mmol/L Final  . Glucose, Bld 03/15/2020 142* 70 - 99 mg/dL Final   Glucose reference range applies only to samples taken after fasting for at least 8 hours.  . BUN 03/15/2020 20  8 - 23 mg/dL Final  . Creatinine, Ser 03/15/2020 1.21* 0.44 - 1.00 mg/dL Final  . Calcium 03/15/2020 9.0  8.9 - 10.3 mg/dL Final  . GFR, Estimated 03/15/2020 42* >60 mL/min Final   Comment: (NOTE) Calculated using the CKD-EPI Creatinine Equation (2021)   . Anion gap 03/15/2020 8  5 - 15 Final   Performed at [facility], [address], [place], [place] [address]  . WBC 03/15/2020 9.9  4.0 - 10.5 K/uL Final  . RBC 03/15/2020 3.74* 3.87 - 5.11 MIL/uL Final  . Hemoglobin 03/15/2020 10.7* 12.0 - 15.0 g/dL Final  . HCT 03/15/2020 34.6* 36.0 - 46.0 % Final  . MCV 03/15/2020 92.5  80.0 - 100.0 fL Final  . MCH 03/15/2020 28.6  26.0 - 34.0 pg Final  . MCHC 03/15/2020 30.9  30.0 - 36.0 g/dL Final  . RDW  03/15/2020 21.8* 11.5 - 15.5 % Final  . Platelets 03/15/2020 593* 150 - 400 K/uL Final  . nRBC 03/15/2020 0.0  0.0 - 0.2 % Final  . Neutrophils Relative % 03/15/2020 70  % Final  . Neutro Abs 03/15/2020 7.0  1.7 - 7.7 K/uL Final  . Lymphocytes Relative 03/15/2020 12  % Final  . Lymphs Abs 03/15/2020 1.2  0.7 - 4.0 K/uL Final  . Monocytes Relative 03/15/2020 8  % Final  . Monocytes Absolute 03/15/2020 0.7  0.1 - 1.0 K/uL Final  . Eosinophils Relative 03/15/2020 5  % Final  . Eosinophils Absolute 03/15/2020 0.5  0.0 - 0.5 K/uL Final  . Basophils Relative 03/15/2020 2  % Final  . Basophils Absolute 03/15/2020 0.2* 0.0 - 0.1 K/uL Final  . Immature Granulocytes 03/15/2020 3  % Final  . Abs Immature Granulocytes 03/15/2020 0.28* 0.00 - 0.07 K/uL Final   Performed at [facility], [address] [place], [place] [address]    Assessment:  Laura Wilcox is a 86 y.o. female with polycythemia rubra veraand secondary myelofibrosis. She presented in 04/2002 with thrombocytosis (835,000). JAK2 V617Fwas positive on 05/20/2012.  Over the past year,hematocrithas ranged 37.5 - 42.4,hemoglobin12.1 - 13.4, platelets458,000 - 569,000, WBC10,500 - 15,700. Creatinine has been 1.0 - 1.2.  Bone marrowon 04/15/2002 revealed a normocellular marrow for age with trilineage hematopoiesis and mild megakaryocytic hyperplasia with focal atypia. There was no morphologic evidence of a myeloproliferative disorder. Iron stains were inadequate for evaluation of storage iron. There was no increase in reticulin fibers. She has been on agrylin since 2012.  Bone marrowon 09/25/2012 revealed a persistent myeloproliferative disorder s/p Agrylin. The current features were best classified as persistent myeloproliferative neoplasm (MPN) JAK2 V617F mutation positive. Marrow was hypercellular for age (30-50%) with increased trilineage hematopoiesis and markedly increased atypical megakaryopoiesis with  frequent clustering. There were no increased blasts. Iron stores were absent. There was moderate to severe myelofibrosis(grade MF 2-3). Differential included ET, PV, primary myelofibrosis, and MPN, unclassifiable. Cytogeneticswere normal (47, XX).  She developed pancytopeniain 12/2015. Etiology  is unclear (medication confusion or hydroxyurea + Agrylin).  She received Procrit40,000 units (intermittently 09/2012 - 06/2016), Venofer(09/2012 - 10/2012), and Infed(12/2015 - 03/2016). She received Neupogenduring a period of neutropenia. She was anagrelidefrom 2004 - 08/13/2017. She beganJakafion 08/14/2017. Jakafi was increased from 5 mg BID to 10 mg BID on03/18/2020.Jakafi was increased to 15 mg BID on 08/03/2018.She has been back on Jakafi 5 mg BID since 02/01/2019 then 10 mg BID on 05/25/2019.  [name] was on hold from 12/20/2019 - 01/12/2020.  Jakafi 5 mg twice daily restarted on 01/12/2020 then increased to 10 mg in the morning and 5 mg in the evening on 02/02/2020.  She began a phlebotomy programon 08/06/2017. She undergoes small volume (150 cc with replacement of 150 cc NS)if her HCT >42.  Ferritinhas been followed: 529 on 09/03/2017, 1020 on 12/03/2017, 423 on 02/18/2018, 858 on 11/17/2018, 785 on 09/23/2019 and 728 on 12/20/2019.Iron saturationwas 37%and TIBC 270 on08/18/2020.B12 was 477 and folate 36 on 11/17/2018.  B12 was 292 and folate 19.1 on 12/20/2019  She was admitted to [facility] 12/17/2018 - 12/18/2018. She received 1 unit of PRBCs.CBC at discharge included a hematocrit of 29.0, hemoglobin 9.4, platelets 364,000, and WBC 10,200.  She received a course of Keflexfor a Proteus mirabilisUTIbeginning 01/11/2019.  Urine culture on 12/27/2019 revealed greater than 100,000 colonies of Enterobacter cloacae.  She was treated with Septra secondary to sensitivities.  The patient has received both doses of the COVID-19 vaccine.  Symptomatically, she  feels "good". She had some back pain this morning. She denies urgency, frequency, and burning.  She misses Jakafi doses unless her daughter oversees the administration.  She continues to decline a neurology consult.   She struggles to drink enough fluid. Exam is stable.  Plan: 1.   Labs today: CBC with diff, BMP 2.Polycythemia rubra vera  Hematocrit 35.8.  Hemoglobin 11.1.  Platelets 643,000 on 03/08/2020.    Hematocrit 34.6.  Hemoglobin 10.7.  Platelets 593,000 on 03/15/2020. Platelet count goal < 400,000. Jakafi 5 mg BID restarted on 01/12/2020 then switched to 10 mg in the morning and 5 mg in the evening on 02/02/2020. Jakafi 5 mg decreased to BID secondary to renal insufficiency. No plan for current dose adjustment secondary to missed doses. 3.Renal insufficiency BUN 20 and creatinine 1.21 today. When left on her own, the patient decreased her fluid intake. Continue daily water chart.   Fluid goal remains at 5 glasses/day No IV fluids today. 4.Anemia             Hematocrit 34.6.  Hemoglobin 10.7. MCV 92.5.               Ferritin 728 with an iron saturation of 26% and a TIBC 223 on 12/20/2019.             B12 was 292 with a folate of 19.1 on 12/20/2019.              B12 goal is 400.             She received Retacrit on 12/24/2019.  Continue to monitor. 5.   Dementia             She appears to have underlying dementia.             She has declined neurology consultation on multiple occasions.  Neurology consult per her daughter. 6.   Left cheek lesion  Lesion appears to be a squamous cell skin cancer.  She has a dermatology appointment. 7.  Dehydration  BUN 32.  Creatinine 1.49 on 03/08/2020.  BUN 20.  Creatinine 1.21 on 03/15/2020.  Patient meeting fluid goals with her daughter's assistance.  Continue to monitor. 8.   No IVF today. 9.   Neurology consult. 10.   RTC in 1 month for MD assessment, labs (CBC with diff, CMP), and +/- IVF.  I discussed the assessment  and treatment plan with the patient.  The patient was provided an opportunity to ask questions and all were answered.  The patient agreed with the plan and demonstrated an understanding of the instructions.  The patient was advised to call back if the symptoms worsen or if the condition fails to improve as anticipated.  I provided 16 minutes of face-to-face time during this this encounter and > 50% was spent counseling as documented under my assessment and plan.  An additional 5 minutes were spent reviewing her chart (Epic and Care Everywhere) including notes, labs, and imaging studies.    Lequita Asal, MD, PhD    03/15/2020, 10:38 AM   I, Mirian Mo Tufford, am acting as Education administrator for [employer]. Mike Gip, MD, PhD.  I, Angeligue Bowne C. Mike Gip, MD, have reviewed the above documentation for accuracy and completeness, and I agree with the above.

## 2020-03-15 ENCOUNTER — Inpatient Hospital Stay: Payer: Medicare Other

## 2020-03-15 ENCOUNTER — Encounter: Payer: Self-pay | Admitting: Hematology and Oncology

## 2020-03-15 ENCOUNTER — Telehealth: Payer: Self-pay

## 2020-03-15 ENCOUNTER — Inpatient Hospital Stay (HOSPITAL_BASED_OUTPATIENT_CLINIC_OR_DEPARTMENT_OTHER): Payer: Medicare Other | Admitting: Hematology and Oncology

## 2020-03-15 VITALS — BP 138/56 | HR 88 | Temp 97.7°F | Resp 16 | Wt 112.1 lb

## 2020-03-15 DIAGNOSIS — D45 Polycythemia vera: Secondary | ICD-10-CM

## 2020-03-15 DIAGNOSIS — C44329 Squamous cell carcinoma of skin of other parts of face: Secondary | ICD-10-CM | POA: Diagnosis not present

## 2020-03-15 DIAGNOSIS — D649 Anemia, unspecified: Secondary | ICD-10-CM | POA: Diagnosis not present

## 2020-03-15 DIAGNOSIS — D631 Anemia in chronic kidney disease: Secondary | ICD-10-CM

## 2020-03-15 DIAGNOSIS — F0390 Unspecified dementia without behavioral disturbance: Secondary | ICD-10-CM | POA: Diagnosis not present

## 2020-03-15 DIAGNOSIS — E860 Dehydration: Secondary | ICD-10-CM | POA: Diagnosis not present

## 2020-03-15 DIAGNOSIS — N289 Disorder of kidney and ureter, unspecified: Secondary | ICD-10-CM

## 2020-03-15 DIAGNOSIS — R4189 Other symptoms and signs involving cognitive functions and awareness: Secondary | ICD-10-CM | POA: Diagnosis not present

## 2020-03-15 DIAGNOSIS — H9190 Unspecified hearing loss, unspecified ear: Secondary | ICD-10-CM | POA: Diagnosis not present

## 2020-03-15 DIAGNOSIS — N1832 Chronic kidney disease, stage 3b: Secondary | ICD-10-CM

## 2020-03-15 DIAGNOSIS — R5383 Other fatigue: Secondary | ICD-10-CM | POA: Diagnosis not present

## 2020-03-15 LAB — CBC WITH DIFFERENTIAL/PLATELET
Abs Immature Granulocytes: 0.28 10*3/uL — ABNORMAL HIGH (ref 0.00–0.07)
Basophils Absolute: 0.2 10*3/uL — ABNORMAL HIGH (ref 0.0–0.1)
Basophils Relative: 2 %
Eosinophils Absolute: 0.5 10*3/uL (ref 0.0–0.5)
Eosinophils Relative: 5 %
HCT: 34.6 % — ABNORMAL LOW (ref 36.0–46.0)
Hemoglobin: 10.7 g/dL — ABNORMAL LOW (ref 12.0–15.0)
Immature Granulocytes: 3 %
Lymphocytes Relative: 12 %
Lymphs Abs: 1.2 10*3/uL (ref 0.7–4.0)
MCH: 28.6 pg (ref 26.0–34.0)
MCHC: 30.9 g/dL (ref 30.0–36.0)
MCV: 92.5 fL (ref 80.0–100.0)
Monocytes Absolute: 0.7 10*3/uL (ref 0.1–1.0)
Monocytes Relative: 8 %
Neutro Abs: 7 10*3/uL (ref 1.7–7.7)
Neutrophils Relative %: 70 %
Platelets: 593 10*3/uL — ABNORMAL HIGH (ref 150–400)
RBC: 3.74 MIL/uL — ABNORMAL LOW (ref 3.87–5.11)
RDW: 21.8 % — ABNORMAL HIGH (ref 11.5–15.5)
WBC: 9.9 10*3/uL (ref 4.0–10.5)
nRBC: 0 % (ref 0.0–0.2)

## 2020-03-15 LAB — BASIC METABOLIC PANEL
Anion gap: 8 (ref 5–15)
BUN: 20 mg/dL (ref 8–23)
CO2: 26 mmol/L (ref 22–32)
Calcium: 9 mg/dL (ref 8.9–10.3)
Chloride: 104 mmol/L (ref 98–111)
Creatinine, Ser: 1.21 mg/dL — ABNORMAL HIGH (ref 0.44–1.00)
GFR, Estimated: 42 mL/min — ABNORMAL LOW (ref >60)
Glucose, Bld: 142 mg/dL — ABNORMAL HIGH (ref 70–99)
Potassium: 3.6 mmol/L (ref 3.5–5.1)
Sodium: 138 mmol/L (ref 135–145)

## 2020-03-15 NOTE — Progress Notes (Signed)
Patient here for oncology follow-up appointment, expresses no complaints or concerns at this time.    

## 2020-03-16 ENCOUNTER — Telehealth: Payer: Self-pay

## 2020-03-16 ENCOUNTER — Other Ambulatory Visit: Payer: Self-pay

## 2020-03-16 ENCOUNTER — Inpatient Hospital Stay: Payer: Medicare Other

## 2020-03-16 DIAGNOSIS — N1832 Chronic kidney disease, stage 3b: Secondary | ICD-10-CM

## 2020-03-16 DIAGNOSIS — N2581 Secondary hyperparathyroidism of renal origin: Secondary | ICD-10-CM

## 2020-03-16 NOTE — Telephone Encounter (Signed)
New patient information faxed over to home Health ([facility]) for Home Health referral. Fax conformation received.

## 2020-03-20 MED FILL — JAKAFI 5 MG TABLET: 5 | 30 days supply | Qty: 60 | Fill #0

## 2020-03-22 ENCOUNTER — Inpatient Hospital Stay: Payer: Medicare Other

## 2020-03-22 ENCOUNTER — Other Ambulatory Visit: Payer: Self-pay

## 2020-03-22 VITALS — BP 116/68 | HR 82 | Temp 97.1°F | Resp 16

## 2020-03-22 DIAGNOSIS — D45 Polycythemia vera: Secondary | ICD-10-CM

## 2020-03-22 DIAGNOSIS — R5383 Other fatigue: Secondary | ICD-10-CM | POA: Diagnosis not present

## 2020-03-22 DIAGNOSIS — F0390 Unspecified dementia without behavioral disturbance: Secondary | ICD-10-CM | POA: Diagnosis not present

## 2020-03-22 DIAGNOSIS — D649 Anemia, unspecified: Secondary | ICD-10-CM | POA: Diagnosis not present

## 2020-03-22 DIAGNOSIS — N289 Disorder of kidney and ureter, unspecified: Secondary | ICD-10-CM | POA: Diagnosis not present

## 2020-03-22 DIAGNOSIS — H9190 Unspecified hearing loss, unspecified ear: Secondary | ICD-10-CM | POA: Diagnosis not present

## 2020-03-22 MED ORDER — SODIUM CHLORIDE 0.9 % IV SOLN
Freq: Once | INTRAVENOUS | Status: AC
  Filled 2020-03-22: qty 250

## 2020-03-22 NOTE — Progress Notes (Signed)
Patient received prescribed treatment in clinic. Tolerated well. Patient stable at discharge. 

## 2020-03-27 ENCOUNTER — Telehealth: Payer: Self-pay | Admitting: Hematology and Oncology

## 2020-03-28 ENCOUNTER — Other Ambulatory Visit: Payer: Self-pay

## 2020-03-28 ENCOUNTER — Encounter: Payer: Self-pay | Admitting: Internal Medicine

## 2020-03-28 ENCOUNTER — Other Ambulatory Visit: Payer: Self-pay | Admitting: Internal Medicine

## 2020-03-28 ENCOUNTER — Ambulatory Visit (INDEPENDENT_AMBULATORY_CARE_PROVIDER_SITE_OTHER): Payer: Medicare Other | Admitting: Internal Medicine

## 2020-03-28 ENCOUNTER — Inpatient Hospital Stay: Payer: Medicare Other

## 2020-03-28 VITALS — BP 120/59 | HR 85 | Temp 96.0°F | Resp 16

## 2020-03-28 VITALS — BP 136/74 | HR 62 | Ht 67.0 in | Wt 113.0 lb

## 2020-03-28 DIAGNOSIS — R5383 Other fatigue: Secondary | ICD-10-CM | POA: Diagnosis not present

## 2020-03-28 DIAGNOSIS — D45 Polycythemia vera: Secondary | ICD-10-CM | POA: Diagnosis not present

## 2020-03-28 DIAGNOSIS — N289 Disorder of kidney and ureter, unspecified: Secondary | ICD-10-CM | POA: Diagnosis not present

## 2020-03-28 DIAGNOSIS — R300 Dysuria: Secondary | ICD-10-CM

## 2020-03-28 DIAGNOSIS — H9190 Unspecified hearing loss, unspecified ear: Secondary | ICD-10-CM | POA: Diagnosis not present

## 2020-03-28 DIAGNOSIS — D649 Anemia, unspecified: Secondary | ICD-10-CM | POA: Diagnosis not present

## 2020-03-28 DIAGNOSIS — F0390 Unspecified dementia without behavioral disturbance: Secondary | ICD-10-CM | POA: Diagnosis not present

## 2020-03-28 LAB — POC URINALYSIS WITH MICROSCOPIC (NON AUTO)MANUAL RESULT
Bilirubin, UA: NEGATIVE
Blood, UA: NEGATIVE
Crystals: 0
Glucose, UA: NEGATIVE
Ketones, UA: NEGATIVE
Leukocytes, UA: NEGATIVE
Mucus, UA: 0
Nitrite, UA: NEGATIVE
Protein, UA: NEGATIVE
RBC: 2 M/uL — AB (ref 4.04–5.48)
Spec Grav, UA: 1.01 (ref 1.010–1.025)
Urobilinogen, UA: 0.2 E.U./dL
WBC Casts, UA: 3
pH, UA: 5 (ref 5.0–8.0)

## 2020-03-28 MED ORDER — SODIUM CHLORIDE 0.9 % IV SOLN
Freq: Once | INTRAVENOUS | Status: AC
  Filled 2020-03-28: qty 250

## 2020-03-28 NOTE — Progress Notes (Signed)
Patient received prescribed treatment in clinic. Tolerated well. Patient stable at discharge. 

## 2020-03-28 NOTE — Progress Notes (Signed)
Date:  03/28/2020   Name:  Laura Wilcox   DOB:  Jan 08, 1927   MRN:  [id number]   Chief Complaint: Urinary Tract Infection (Lower right back pain. Daughter [name] is concerned of UTI. Not going to the bathroom enough. HX of UTIs. No HX of burning or urge to urinate. Tiredness. )  Urinary Tract Infection  This is a new (?) problem. The patient is experiencing no pain. Associated symptoms include flank pain. Pertinent negatives include no chills, hematuria or urgency. Associated symptoms comments: And fatigue.    Lab Results  Component Value Date   CREATININE 1.21 (H) 03/15/2020   BUN 20 03/15/2020   NA 138 03/15/2020   K 3.6 03/15/2020   CL 104 03/15/2020   CO2 26 03/15/2020   Lab Results  Component Value Date   CHOL 279 (H) 05/27/2018   HDL 72 05/27/2018   LDLCALC 182 (H) 05/27/2018   TRIG 125 05/27/2018   CHOLHDL 3.9 05/27/2018   Lab Results  Component Value Date   TSH 2.085 12/20/2019   No results found for: HGBA1C Lab Results  Component Value Date   WBC 9.9 03/15/2020   HGB 10.7 (L) 03/15/2020   HCT 34.6 (L) 03/15/2020   MCV 92.5 03/15/2020   PLT 593 (H) 03/15/2020   Lab Results  Component Value Date   ALT 12 03/08/2020   AST 18 03/08/2020   ALKPHOS 75 03/08/2020   BILITOT 0.3 03/08/2020     Review of Systems  Constitutional: Positive for fatigue. Negative for chills and fever.  Respiratory: Negative for cough, chest tightness and shortness of breath.   Cardiovascular: Negative for chest pain.  Gastrointestinal: Negative for abdominal pain, constipation and diarrhea.  Genitourinary: Positive for flank pain. Negative for dysuria, hematuria and urgency.    Patient Active Problem List   Diagnosis Date Noted  . Renal insufficiency 03/14/2020  . Dehydration 03/12/2020  . Myelofibrosis ([place]) 01/31/2020  . Other pancytopenia ([place]) 01/31/2020  . Squamous cell cancer of skin of left cheek 01/16/2020  . Chronic kidney disease, stage 3b ([place]) 03/11/2019   . Secondary hyperparathyroidism of renal origin ([place]) 03/11/2019  . Anemia in chronic kidney disease 03/11/2019  . Generalized weakness 12/17/2018  . Elevated ferritin 02/18/2018  . Goals of care, counseling/discussion 07/09/2017  . Polycythemia rubra vera ([place]) 06/02/2017  . Underweight 06/02/2017  . Advanced care planning/counseling discussion 06/02/2017  . Cognitive impairment 06/02/2017    No Known Allergies  Past Surgical History:  Procedure Laterality Date  . ABDOMINAL HYSTERECTOMY     complete  . BREAST SURGERY      Social History   Tobacco Use  . Smoking status: Former Smoker    Packs/day: 1.00    Years: 30.00    Pack years: 30.00    Quit date: 04/02/1978    Years since quitting: 42.0  . Smokeless tobacco: Never Used  Vaping Use  . Vaping Use: Never used  Substance Use Topics  . Alcohol use: No    Comment: occasional glass of wine  . Drug use: No     Medication list has been reviewed and updated.  Current Meds  Medication Sig  . acetaminophen (TYLENOL) 325 MG tablet Take 650 mg by mouth every 6 (six) hours as needed.  . calcitRIOL (ROCALTROL) 0.25 MCG capsule Take 0.25 mcg by mouth 3 (three) times a week.   Marland Kitchen JAKAFI 5 MG tablet TAKE 1 TABLET (5 MG TOTAL) BY MOUTH 2 (TWO) TIMES DAILY.    PHQ  2/9 Scores 03/28/2020 01/31/2020 06/02/2017  PHQ - 2 Score 0 0 0  PHQ- 9 Score 0 0 0    GAD 7 : Generalized Anxiety Score 03/28/2020 01/31/2020  Nervous, Anxious, on Edge 0 0  Control/stop worrying 0 0  Worry too much - different things 0 0  Trouble relaxing 0 0  Restless 0 0  Easily annoyed or irritable 0 0  Afraid - awful might happen 0 0  Total GAD 7 Score 0 0  Anxiety Difficulty Not difficult at all Not difficult at all    BP Readings from Last 3 Encounters:  03/28/20 136/74  03/28/20 (!) 65/53  03/22/20 116/68    Physical Exam Vitals and nursing note reviewed.  Constitutional:      Appearance: Normal appearance. She is well-developed and  well-nourished.  HENT:     Right Ear: Decreased hearing noted.     Left Ear: Decreased hearing noted.  Cardiovascular:     Rate and Rhythm: Normal rate and regular rhythm.     Heart sounds: Normal heart sounds.  Pulmonary:     Effort: Pulmonary effort is normal. No respiratory distress.     Breath sounds: Normal breath sounds.  Abdominal:     General: Bowel sounds are normal.     Palpations: Abdomen is soft.     Tenderness: There is no abdominal tenderness. There is right CVA tenderness. There is no CVA tenderness, guarding or rebound.  Skin:    General: Skin is warm and dry.  Neurological:     Mental Status: She is alert.  Psychiatric:        Mood and Affect: Mood and affect normal.     Wt Readings from Last 3 Encounters:  03/28/20 113 lb (51.3 kg)  03/15/20 112 lb 1.7 oz (50.8 kg)  03/08/20 108 lb 14.5 oz (49.4 kg)    BP 136/74   Pulse 62   Ht 5\' 7"  (1.702 m)   Wt 113 lb (51.3 kg)   BMI 17.70 kg/m   Assessment and Plan: 1. Dysuria Hx of recurrent UTI UA shows minimal bacteria so will culture before treating Continue efforts to increase fluids Use Tylenol and heat for back discomfort - POC urinalysis w microscopic (non auto)   Partially dictated using Editor, commissioning. Any errors are unintentional.  Halina Maidens, MD [facility]  03/28/2020

## 2020-04-01 LAB — URINE CULTURE

## 2020-04-05 ENCOUNTER — Telehealth: Payer: Self-pay | Admitting: Hematology and Oncology

## 2020-04-06 ENCOUNTER — Inpatient Hospital Stay: Payer: Medicare Other | Attending: Hematology and Oncology

## 2020-04-06 ENCOUNTER — Other Ambulatory Visit: Payer: Self-pay

## 2020-04-06 VITALS — BP 130/71 | HR 71 | Temp 96.6°F | Resp 16 | Wt 111.1 lb

## 2020-04-06 DIAGNOSIS — F0390 Unspecified dementia without behavioral disturbance: Secondary | ICD-10-CM | POA: Diagnosis not present

## 2020-04-06 DIAGNOSIS — Z87440 Personal history of urinary (tract) infections: Secondary | ICD-10-CM | POA: Diagnosis not present

## 2020-04-06 DIAGNOSIS — Z79899 Other long term (current) drug therapy: Secondary | ICD-10-CM | POA: Diagnosis not present

## 2020-04-06 DIAGNOSIS — D7581 Myelofibrosis: Secondary | ICD-10-CM | POA: Diagnosis not present

## 2020-04-06 DIAGNOSIS — Z8249 Family history of ischemic heart disease and other diseases of the circulatory system: Secondary | ICD-10-CM | POA: Diagnosis not present

## 2020-04-06 DIAGNOSIS — Z818 Family history of other mental and behavioral disorders: Secondary | ICD-10-CM | POA: Diagnosis not present

## 2020-04-06 DIAGNOSIS — Z87891 Personal history of nicotine dependence: Secondary | ICD-10-CM | POA: Diagnosis not present

## 2020-04-06 DIAGNOSIS — M549 Dorsalgia, unspecified: Secondary | ICD-10-CM | POA: Insufficient documentation

## 2020-04-06 DIAGNOSIS — E860 Dehydration: Secondary | ICD-10-CM | POA: Insufficient documentation

## 2020-04-06 DIAGNOSIS — D649 Anemia, unspecified: Secondary | ICD-10-CM | POA: Diagnosis not present

## 2020-04-06 DIAGNOSIS — D45 Polycythemia vera: Secondary | ICD-10-CM | POA: Diagnosis not present

## 2020-04-06 DIAGNOSIS — R413 Other amnesia: Secondary | ICD-10-CM | POA: Diagnosis not present

## 2020-04-06 DIAGNOSIS — Z803 Family history of malignant neoplasm of breast: Secondary | ICD-10-CM | POA: Diagnosis not present

## 2020-04-06 DIAGNOSIS — N289 Disorder of kidney and ureter, unspecified: Secondary | ICD-10-CM | POA: Insufficient documentation

## 2020-04-06 DIAGNOSIS — H9190 Unspecified hearing loss, unspecified ear: Secondary | ICD-10-CM | POA: Diagnosis not present

## 2020-04-06 MED ORDER — SODIUM CHLORIDE 0.9 % IV SOLN
Freq: Once | INTRAVENOUS | Status: AC
  Filled 2020-04-06: qty 250

## 2020-04-06 NOTE — Progress Notes (Signed)
Patient received prescribed treatment in clinic.(500 cc NS over 4 hrs). Tolerated well. Patient stable at discharge.

## 2020-04-07 NOTE — Telephone Encounter (Signed)
Spoke to daughter to let her know about appt. being scheduled on 04/06/20@9am . She said it was ok and she will see [place] tomorrow.  By Aubery Lapping

## 2020-04-11 DIAGNOSIS — D0439 Carcinoma in situ of skin of other parts of face: Secondary | ICD-10-CM | POA: Diagnosis not present

## 2020-04-11 DIAGNOSIS — C44329 Squamous cell carcinoma of skin of other parts of face: Secondary | ICD-10-CM | POA: Diagnosis not present

## 2020-04-11 NOTE — Progress Notes (Signed)
[facility]  [address] [place], [place] [address] Phone: [phone]  Fax: [phone]   Clinic Day:  04/12/2020   Referring physician: Glean Hess, MD  Chief Complaint: Laura Wilcox is a 87 y.o. female with polycythemia rubra vera (PV)on [name] who is seen for 1 month assessment.   HPI: The patient was last seen in the hematology clinic on 03/15/2020. At that time, she felt "good". She had some back pain that morning. She denied urgency, frequency, and burning. She missed Jakafi doses unless her daughter oversaw the administration. She struggled to drink enough fluid. Exam was stable. Hematocrit was 34.6, hemoglobin 10.7, MCV 92.5, platelets 593,000, WBC 9,900. Creatinine was 1.21 (CrCl 42 ml/min). We discussed a neurology consult for her dementia; patient continued to decline.  The patient received IV fluids weekly x 3 (03/22/2020 - 04/06/2020).  The patient is scheduled to see Dr. Manuella Ghazi on 05/18/2020.  During the interim, she has been "sleepy." She has still been struggling to drink water, though she did drink 35 oz yesterday.  She missed one dose of Jakafi on Tuesday evening and one last week. Overall, she misses about one dose per week.  The patient saw Dr. Lacinda Axon on 04/11/2020. They discussed that the patient had a bad reaction to numbing medication in the past, but that the area on her face needed to be biopsied. The patient is going to follow up with a general dermatologist for a topical treatment. The patient would like a referral to Dr. Nehemiah Massed.   Past Medical History:  Diagnosis Date  . Confusion 12/30/2018  . Polycythemia   . Urinary tract infection without hematuria 01/02/2020  . Urticaria 06/02/2017    Past Surgical History:  Procedure Laterality Date  . ABDOMINAL HYSTERECTOMY     complete  . BREAST SURGERY      Family History  Problem Relation Age of Onset  . Heart disease Father   . Cancer Sister        breast   . Cancer Sister        breast  . Alzheimer's disease Sister     Social History:  reports that she quit smoking about 42 years ago. She has a 30.00 pack-year smoking history. She has never used smokeless tobacco. She reports that she does not drink alcohol and does not use drugs. She is widowed. Patient is retired Sport and exercise psychologist. Patient moved to [facility] in 03/2017. She lives independently in her own apartment in [place]. Her daughter lives next door.Patient denies known exposures to radiation on toxins.Pt's daughter, [name] be reached at [phone]. She lives in [place].  The patient is accompanied by her daughter today.   Allergies: No Known Allergies  Current Medications: Current Outpatient Medications  Medication Sig Dispense Refill  . acetaminophen (TYLENOL) 325 MG tablet Take 650 mg by mouth every 6 (six) hours as needed.    . calcitRIOL (ROCALTROL) 0.25 MCG capsule Take 0.25 mcg by mouth 3 (three) times a week.     Marland Kitchen JAKAFI 5 MG tablet TAKE 1 TABLET (5 MG TOTAL) BY MOUTH 2 (TWO) TIMES DAILY. 60 tablet 0   No current facility-administered medications for this visit.    Review of Systems  Constitutional: Positive for weight loss (1 lb). Negative for chills, diaphoresis, fever and malaise/fatigue.       Feels "sleepy."  HENT: Positive for hearing loss. Negative for congestion, ear discharge, ear pain, nosebleeds, sinus pain, sore throat and tinnitus.   Eyes: Negative.  Negative  for blurred vision.  Respiratory: Negative.  Negative for cough, hemoptysis, sputum production and shortness of breath.   Cardiovascular: Negative.  Negative for chest pain, palpitations and leg swelling.  Gastrointestinal: Negative.  Negative for abdominal pain, blood in stool, constipation, diarrhea, heartburn, melena, nausea and vomiting.  Genitourinary: Negative.  Negative for dysuria, frequency, hematuria and urgency.  Musculoskeletal: Negative for back pain (this morning, resolved), joint pain, myalgias and  neck pain.  Skin: Negative.  Negative for itching and rash.  Neurological: Negative.  Negative for dizziness, tingling, sensory change, weakness and headaches.  Endo/Heme/Allergies: Negative.  Does not bruise/bleed easily.  Psychiatric/Behavioral: Positive for memory loss. Negative for depression. The patient is not nervous/anxious and does not have insomnia.   All other systems reviewed and are negative.  Performance status (ECOG):  2  Vitals Blood pressure (!) 157/53, pulse 79, temperature (!) 96.5 F (35.8 C), temperature source Tympanic, resp. rate 20, weight 111 lb 5.3 oz (50.5 kg), SpO2 95 %.   Physical Exam Vitals and nursing note reviewed.  Constitutional:      General: She is not in acute distress.    Appearance: She is not diaphoretic.     Interventions: Face mask in place.  HENT:     Head: Normocephalic and atraumatic.     Comments: Short dark blonde hair.    Mouth/Throat:     Mouth: Mucous membranes are moist.     Pharynx: Oropharynx is clear.  Eyes:     General: No scleral icterus.    Extraocular Movements: Extraocular movements intact.     Conjunctiva/sclera: Conjunctivae normal.     Pupils: Pupils are equal, round, and reactive to light.     Comments: Blue eyes.  Cardiovascular:     Rate and Rhythm: Normal rate and regular rhythm.     Heart sounds: Normal heart sounds. No murmur heard.   Pulmonary:     Effort: Pulmonary effort is normal. No respiratory distress.     Breath sounds: Normal breath sounds. No wheezing or rales.  Chest:     Chest wall: No tenderness.  Breasts:     Right: No axillary adenopathy or supraclavicular adenopathy.     Left: No axillary adenopathy or supraclavicular adenopathy.    Abdominal:     General: Bowel sounds are normal. There is no distension.     Palpations: Abdomen is soft. There is no hepatomegaly, splenomegaly or mass.     Tenderness: There is no abdominal tenderness. There is no guarding or rebound.  Musculoskeletal:         General: Tenderness (BLE) present. No swelling. Normal range of motion.     Cervical back: Normal range of motion and neck supple.  Lymphadenopathy:     Head:     Right side of head: No preauricular, posterior auricular or occipital adenopathy.     Left side of head: No preauricular, posterior auricular or occipital adenopathy.     Cervical: No cervical adenopathy.     Upper Body:     Right upper body: No supraclavicular or axillary adenopathy.     Left upper body: No supraclavicular or axillary adenopathy.     Lower Body: No right inguinal adenopathy. No left inguinal adenopathy.  Skin:    General: Skin is warm and dry.  Neurological:     Mental Status: She is alert and oriented to person, place, and time. Mental status is at baseline.  Psychiatric:        Behavior: Behavior normal.  Judgment: Judgment normal.      01/04/2020     Appointment on 04/12/2020  Component Date Value Ref Range Status  . Sodium 04/12/2020 134* 135 - 145 mmol/L Final  . Potassium 04/12/2020 4.3  3.5 - 5.1 mmol/L Final  . Chloride 04/12/2020 99  98 - 111 mmol/L Final  . CO2 04/12/2020 26  22 - 32 mmol/L Final  . Glucose, Bld 04/12/2020 71  70 - 99 mg/dL Final   Glucose reference range applies only to samples taken after fasting for at least 8 hours.  . BUN 04/12/2020 27* 8 - 23 mg/dL Final  . Creatinine, Ser 04/12/2020 1.17* 0.44 - 1.00 mg/dL Final  . Calcium 04/12/2020 9.2  8.9 - 10.3 mg/dL Final  . Total Protein 04/12/2020 7.1  6.5 - 8.1 g/dL Final  . Albumin 04/12/2020 4.1  3.5 - 5.0 g/dL Final  . AST 04/12/2020 19  15 - 41 U/L Final  . ALT 04/12/2020 13  0 - 44 U/L Final  . Alkaline Phosphatase 04/12/2020 93  38 - 126 U/L Final  . Total Bilirubin 04/12/2020 0.4  0.3 - 1.2 mg/dL Final  . GFR, Estimated 04/12/2020 44* >60 mL/min Final   Comment: (NOTE) Calculated using the CKD-EPI Creatinine Equation (2021)   . Anion gap 04/12/2020 9  5 - 15 Final   Performed at [facility], [address], [place], [place] [address]  . WBC 04/12/2020 8.5  4.0 - 10.5 K/uL Final  . RBC 04/12/2020 3.88  3.87 - 5.11 MIL/uL Final  . Hemoglobin 04/12/2020 10.9* 12.0 - 15.0 g/dL Final  . HCT 04/12/2020 35.3* 36.0 - 46.0 % Final  . MCV 04/12/2020 91.0  80.0 - 100.0 fL Final  . MCH 04/12/2020 28.1  26.0 - 34.0 pg Final  . MCHC 04/12/2020 30.9  30.0 - 36.0 g/dL Final  . RDW 04/12/2020 22.1* 11.5 - 15.5 % Final  . Platelets 04/12/2020 578* 150 - 400 K/uL Final  . nRBC 04/12/2020 0.0  0.0 - 0.2 % Final  . Neutrophils Relative % 04/12/2020 62  % Final  . Neutro Abs 04/12/2020 5.3  1.7 - 7.7 K/uL Final  . Lymphocytes Relative 04/12/2020 17  % Final  . Lymphs Abs 04/12/2020 1.5  0.7 - 4.0 K/uL Final  . Monocytes Relative 04/12/2020 9  % Final  . Monocytes Absolute 04/12/2020 0.8  0.1 - 1.0 K/uL Final  . Eosinophils Relative 04/12/2020 5  % Final  . Eosinophils Absolute 04/12/2020 0.5  0.0 - 0.5 K/uL Final  . Basophils Relative 04/12/2020 2  % Final  . Basophils Absolute 04/12/2020 0.2* 0.0 - 0.1 K/uL Final  . Immature Granulocytes 04/12/2020 5  % Final  . Abs Immature Granulocytes 04/12/2020 0.40* 0.00 - 0.07 K/uL Final   Performed at [facility], [address] [place], [place] [address]    Assessment:  Denise Washburn is a 87 y.o. female with polycythemia rubra veraand secondary myelofibrosis. She presented in 04/2002 with thrombocytosis (835,000). JAK2 V617Fwas positive on 05/20/2012.  Over the past year,hematocrithas ranged 37.5 - 42.4,hemoglobin12.1 - 13.4, platelets458,000 - 569,000, WBC10,500 - 15,700. Creatinine has been 1.0 - 1.2.  Bone marrowon 04/15/2002 revealed a normocellular marrow for age with trilineage hematopoiesis and mild megakaryocytic hyperplasia with focal atypia. There was no morphologic evidence of a myeloproliferative disorder. Iron stains were inadequate for evaluation of storage iron. There was no increase in  reticulin fibers. She has been on agrylin since 2012.  Bone marrowon  09/25/2012 revealed a persistent myeloproliferative disorder s/p Agrylin. The current features were best classified as persistent myeloproliferative neoplasm (MPN) JAK2 V617F mutation positive. Marrow was hypercellular for age (30-50%) with increased trilineage hematopoiesis and markedly increased atypical megakaryopoiesis with frequent clustering. There were no increased blasts. Iron stores were absent. There was moderate to severe myelofibrosis(grade MF 2-3). Differential included ET, PV, primary myelofibrosis, and MPN, unclassifiable. Cytogeneticswere normal (48, XX).  She developed pancytopeniain 12/2015. Etiology is unclear (medication confusion or hydroxyurea + Agrylin).  She received Procrit40,000 units (intermittently 09/2012 - 06/2016), Venofer(09/2012 - 10/2012), and Infed(12/2015 - 03/2016). She received Neupogenduring a period of neutropenia. She was anagrelidefrom 2004 - 08/13/2017. She beganJakafion 08/14/2017. Jakafi was increased from 5 mg BID to 10 mg BID on03/18/2020.Jakafi was increased to 15 mg BID on 08/03/2018.She has been back on Jakafi 5 mg BID since 02/01/2019 then 10 mg BID on 05/25/2019.  [name] was on hold from 12/20/2019 - 01/12/2020.  Jakafi 5 mg twice daily restarted on 01/12/2020 then increased to 10 mg in the morning and 5 mg in the evening on 02/02/2020.  She began a phlebotomy programon 08/06/2017. She undergoes small volume (150 cc with replacement of 150 cc NS)if her HCT >42.  Ferritinhas been followed: 529 on 09/03/2017, 1020 on 12/03/2017, 423 on 02/18/2018, 858 on 11/17/2018, 785 on 09/23/2019 and 728 on 12/20/2019.Iron saturationwas 37%and TIBC 270 on08/18/2020.B12 was 477 and folate 36 on 11/17/2018.  B12 was 292 and folate 19.1 on 12/20/2019  She was admitted to [facility] 12/17/2018 - 12/18/2018. She received 1 unit of PRBCs.CBC at discharge  included a hematocrit of 29.0, hemoglobin 9.4, platelets 364,000, and WBC 10,200.  She received a course of Keflexfor a Proteus mirabilisUTIbeginning 01/11/2019.  Urine culture on 12/27/2019 revealed greater than 100,000 colonies of Enterobacter cloacae.  She was treated with Septra secondary to sensitivities.  The patient has received both doses of the COVID-19 vaccine.  Symptomatically, she has been "sleepy." She has still been struggling to drink water.  She misses 1 dose of Jakafi a week.  Exam is stable.  Plan: 1.   Labs today: CBC with diff, CMP. 2.Polycythemia rubra vera  Hematocrit 35.8.  Hemoglobin 11.1.  Platelets 643,000 on 03/08/2020.    Hematocrit 34.6.  Hemoglobin 10.7.  Platelets 593,000 on 03/15/2020.  Hematocrit 35.3.  Hemoglobin 10.9.  Platelets 578,000 on 04/12/2020. Platelet goal remains < 400,000. She has been on Jakafi 10 mg in the morning and 5 mg in the evening since 02/02/2020. Discuss maintaining current dose of Jakafi based on prior myelosuppression and adequate counts. 3.Renal insufficiency BUN 27 and creatinine 1.17 today. Patient requires encouragement to drink her fluid goal of 5 glasses a day. Continue daily water chart. No IV fluids today. 4.Anemia             Hematocrit 35.3.  Hemoglobin 10.9. MCV 91.0.               Ferritin 728 with an iron saturation of 26% and a TIBC 223 on 12/20/2019.             B12 was 292 (low normal) with a folate of 19.1 on 12/20/2019.              B12 goal is 400.             She received Retacrit on 12/24/2019.  Continue to monitor. 5.   Dementia             She appears to have underlying dementia.  She has declined neurology consultation on multiple occasions.  She has an appointment with Dr. Manuella Ghazi on 05/20/2020. 6.   Left cheek lesion  Patient appears to be squamous cell skin cancer.  She saw Dr. Lacinda Axon on 04/11/2020.   Concern was for biopsy with anesthesia.   Topical preparation (? 5FU) was  discussed.  Patient's daughter would like a second opinion. 7.   Dehydration  BUN 32.  Creatinine 1.49 on 03/08/2020.  BUN 27.  Creatinine 1.17 on 04/12/2020.  Continue to encourage maintaining fluid goals.  No IV fluid today.  Patient's daughter encouraged to contact clinic if any interval concerns for dehydration. 8.   Dermatology referral (Dr Nehemiah Massed). 9.   RTC in 1 month for MD assessment, labs (CBC with diff, CMP, B12), and +/- IVF.  I discussed the assessment and treatment plan with the patient.  The patient was provided an opportunity to ask questions and all were answered.  The patient agreed with the plan and demonstrated an understanding of the instructions.  The patient was advised to call back if the symptoms worsen or if the condition fails to improve as anticipated.  I provided 22 minutes of face-to-face time during this this encounter and > 50% was spent counseling as documented under my assessment and plan.  An additional 5-10 minutes were spent reviewing her chart (Epic and Care Everywhere) including notes, labs, and imaging studies.    Lequita Asal, MD, PhD    04/12/2020, 11:22 AM   I, Mirian Mo Tufford, am acting as Education administrator for [employer]. Mike Gip, MD, PhD.  I, Makelle Marrone C. Mike Gip, MD, have reviewed the above documentation for accuracy and completeness, and I agree with the above.

## 2020-04-12 ENCOUNTER — Encounter: Payer: Self-pay | Admitting: Hematology and Oncology

## 2020-04-12 ENCOUNTER — Inpatient Hospital Stay (HOSPITAL_BASED_OUTPATIENT_CLINIC_OR_DEPARTMENT_OTHER): Payer: Medicare Other | Admitting: Hematology and Oncology

## 2020-04-12 ENCOUNTER — Inpatient Hospital Stay: Payer: Medicare Other

## 2020-04-12 ENCOUNTER — Other Ambulatory Visit: Payer: Self-pay

## 2020-04-12 VITALS — BP 157/53 | HR 79 | Temp 96.5°F | Resp 20 | Wt 111.3 lb

## 2020-04-12 DIAGNOSIS — D45 Polycythemia vera: Secondary | ICD-10-CM | POA: Diagnosis not present

## 2020-04-12 DIAGNOSIS — M549 Dorsalgia, unspecified: Secondary | ICD-10-CM | POA: Diagnosis not present

## 2020-04-12 DIAGNOSIS — R4189 Other symptoms and signs involving cognitive functions and awareness: Secondary | ICD-10-CM

## 2020-04-12 DIAGNOSIS — D649 Anemia, unspecified: Secondary | ICD-10-CM | POA: Diagnosis not present

## 2020-04-12 DIAGNOSIS — C44329 Squamous cell carcinoma of skin of other parts of face: Secondary | ICD-10-CM

## 2020-04-12 DIAGNOSIS — Z7189 Other specified counseling: Secondary | ICD-10-CM

## 2020-04-12 DIAGNOSIS — E860 Dehydration: Secondary | ICD-10-CM | POA: Diagnosis not present

## 2020-04-12 DIAGNOSIS — N1832 Chronic kidney disease, stage 3b: Secondary | ICD-10-CM

## 2020-04-12 DIAGNOSIS — N289 Disorder of kidney and ureter, unspecified: Secondary | ICD-10-CM

## 2020-04-12 DIAGNOSIS — D631 Anemia in chronic kidney disease: Secondary | ICD-10-CM

## 2020-04-12 DIAGNOSIS — F0390 Unspecified dementia without behavioral disturbance: Secondary | ICD-10-CM | POA: Diagnosis not present

## 2020-04-12 LAB — CBC WITH DIFFERENTIAL/PLATELET
Abs Immature Granulocytes: 0.4 10*3/uL — ABNORMAL HIGH (ref 0.00–0.07)
Basophils Absolute: 0.2 10*3/uL — ABNORMAL HIGH (ref 0.0–0.1)
Basophils Relative: 2 %
Eosinophils Absolute: 0.5 10*3/uL (ref 0.0–0.5)
Eosinophils Relative: 5 %
HCT: 35.3 % — ABNORMAL LOW (ref 36.0–46.0)
Hemoglobin: 10.9 g/dL — ABNORMAL LOW (ref 12.0–15.0)
Immature Granulocytes: 5 %
Lymphocytes Relative: 17 %
Lymphs Abs: 1.5 10*3/uL (ref 0.7–4.0)
MCH: 28.1 pg (ref 26.0–34.0)
MCHC: 30.9 g/dL (ref 30.0–36.0)
MCV: 91 fL (ref 80.0–100.0)
Monocytes Absolute: 0.8 10*3/uL (ref 0.1–1.0)
Monocytes Relative: 9 %
Neutro Abs: 5.3 10*3/uL (ref 1.7–7.7)
Neutrophils Relative %: 62 %
Platelets: 578 10*3/uL — ABNORMAL HIGH (ref 150–400)
RBC: 3.88 MIL/uL (ref 3.87–5.11)
RDW: 22.1 % — ABNORMAL HIGH (ref 11.5–15.5)
WBC: 8.5 10*3/uL (ref 4.0–10.5)
nRBC: 0 % (ref 0.0–0.2)

## 2020-04-12 LAB — COMPREHENSIVE METABOLIC PANEL
ALT: 13 U/L (ref 0–44)
AST: 19 U/L (ref 15–41)
Albumin: 4.1 g/dL (ref 3.5–5.0)
Alkaline Phosphatase: 93 U/L (ref 38–126)
Anion gap: 9 (ref 5–15)
BUN: 27 mg/dL — ABNORMAL HIGH (ref 8–23)
CO2: 26 mmol/L (ref 22–32)
Calcium: 9.2 mg/dL (ref 8.9–10.3)
Chloride: 99 mmol/L (ref 98–111)
Creatinine, Ser: 1.17 mg/dL — ABNORMAL HIGH (ref 0.44–1.00)
GFR, Estimated: 44 mL/min — ABNORMAL LOW (ref >60)
Glucose, Bld: 71 mg/dL (ref 70–99)
Potassium: 4.3 mmol/L (ref 3.5–5.1)
Sodium: 134 mmol/L — ABNORMAL LOW (ref 135–145)
Total Bilirubin: 0.4 mg/dL (ref 0.3–1.2)
Total Protein: 7.1 g/dL (ref 6.5–8.1)

## 2020-04-12 NOTE — Progress Notes (Signed)
Patient here for oncology follow-up appointment, expresses concerns of fatigue 

## 2020-04-20 ENCOUNTER — Ambulatory Visit: Payer: Medicare Other | Admitting: Dermatology

## 2020-04-21 ENCOUNTER — Encounter: Payer: Self-pay | Admitting: Hematology and Oncology

## 2020-04-21 ENCOUNTER — Other Ambulatory Visit: Payer: Self-pay

## 2020-04-21 ENCOUNTER — Inpatient Hospital Stay: Payer: Medicare Other

## 2020-04-21 ENCOUNTER — Inpatient Hospital Stay: Payer: Medicare Other | Attending: Hematology and Oncology

## 2020-04-21 ENCOUNTER — Other Ambulatory Visit: Payer: Medicare Other

## 2020-04-21 ENCOUNTER — Telehealth: Payer: Self-pay | Admitting: *Deleted

## 2020-04-21 VITALS — BP 100/60 | HR 70 | Temp 98.1°F | Resp 28

## 2020-04-21 DIAGNOSIS — D631 Anemia in chronic kidney disease: Secondary | ICD-10-CM | POA: Diagnosis not present

## 2020-04-21 DIAGNOSIS — E860 Dehydration: Secondary | ICD-10-CM | POA: Diagnosis not present

## 2020-04-21 DIAGNOSIS — N1832 Chronic kidney disease, stage 3b: Secondary | ICD-10-CM | POA: Insufficient documentation

## 2020-04-21 DIAGNOSIS — C44329 Squamous cell carcinoma of skin of other parts of face: Secondary | ICD-10-CM

## 2020-04-21 DIAGNOSIS — N289 Disorder of kidney and ureter, unspecified: Secondary | ICD-10-CM

## 2020-04-21 DIAGNOSIS — D45 Polycythemia vera: Secondary | ICD-10-CM | POA: Diagnosis not present

## 2020-04-21 DIAGNOSIS — Z7189 Other specified counseling: Secondary | ICD-10-CM

## 2020-04-21 LAB — CBC WITH DIFFERENTIAL/PLATELET
Abs Immature Granulocytes: 0.28 10*3/uL — ABNORMAL HIGH (ref 0.00–0.07)
Basophils Absolute: 0.2 10*3/uL — ABNORMAL HIGH (ref 0.0–0.1)
Basophils Relative: 2 %
Eosinophils Absolute: 0.5 10*3/uL (ref 0.0–0.5)
Eosinophils Relative: 6 %
HCT: 34 % — ABNORMAL LOW (ref 36.0–46.0)
Hemoglobin: 10.8 g/dL — ABNORMAL LOW (ref 12.0–15.0)
Immature Granulocytes: 3 %
Lymphocytes Relative: 17 %
Lymphs Abs: 1.6 10*3/uL (ref 0.7–4.0)
MCH: 28.6 pg (ref 26.0–34.0)
MCHC: 31.8 g/dL (ref 30.0–36.0)
MCV: 90.2 fL (ref 80.0–100.0)
Monocytes Absolute: 0.7 10*3/uL (ref 0.1–1.0)
Monocytes Relative: 7 %
Neutro Abs: 6 10*3/uL (ref 1.7–7.7)
Neutrophils Relative %: 65 %
Platelets: 548 10*3/uL — ABNORMAL HIGH (ref 150–400)
RBC: 3.77 MIL/uL — ABNORMAL LOW (ref 3.87–5.11)
RDW: 22.2 % — ABNORMAL HIGH (ref 11.5–15.5)
WBC: 9.2 10*3/uL (ref 4.0–10.5)
nRBC: 0 % (ref 0.0–0.2)

## 2020-04-21 LAB — COMPREHENSIVE METABOLIC PANEL
ALT: 12 U/L (ref 0–44)
AST: 21 U/L (ref 15–41)
Albumin: 3.9 g/dL (ref 3.5–5.0)
Alkaline Phosphatase: 77 U/L (ref 38–126)
Anion gap: 6 (ref 5–15)
BUN: 22 mg/dL (ref 8–23)
CO2: 27 mmol/L (ref 22–32)
Calcium: 9.1 mg/dL (ref 8.9–10.3)
Chloride: 101 mmol/L (ref 98–111)
Creatinine, Ser: 1.12 mg/dL — ABNORMAL HIGH (ref 0.44–1.00)
GFR, Estimated: 46 mL/min — ABNORMAL LOW (ref >60)
Glucose, Bld: 164 mg/dL — ABNORMAL HIGH (ref 70–99)
Potassium: 4.1 mmol/L (ref 3.5–5.1)
Sodium: 134 mmol/L — ABNORMAL LOW (ref 135–145)
Total Bilirubin: 0.4 mg/dL (ref 0.3–1.2)
Total Protein: 6.8 g/dL (ref 6.5–8.1)

## 2020-04-21 MED ORDER — SODIUM CHLORIDE 0.9 % IV SOLN
Freq: Once | INTRAVENOUS | Status: AC
  Filled 2020-04-21: qty 250

## 2020-04-21 NOTE — Progress Notes (Signed)
Per Beckey Rutter, NP, administer 576ml NS over 2 hours. Orders entered.

## 2020-04-21 NOTE — Progress Notes (Signed)
Pt tolerated hydration well. NT stayed in room with patient for observation d/t pts dementia. Pt ambulatory. Discharged accompanied by daughter.

## 2020-04-21 NOTE — Telephone Encounter (Signed)
Received orders from Beckey Rutter, NP for labs and possible IV fluids. Lab and IV fluids scheduled. Daughter, [name], notified and informed of appointment times.

## 2020-04-21 NOTE — Telephone Encounter (Signed)
Daughter called reporting that per Dr Mike Gip, patient is to drink 35 oz of fluid per day and if she does not she is to come in for IV fluids, she reports that for the past 2 days patient has not reached that target of fluid intake and that she has become lethargic, disoriented and argumentative which is what happens when she does not drink enough. She is requesting for patient to come to [facility] today for IV fluids. Please advise.

## 2020-04-21 NOTE — Telephone Encounter (Signed)
  Thanks for looking after her.  M

## 2020-05-01 ENCOUNTER — Encounter: Payer: Self-pay | Admitting: Hematology and Oncology

## 2020-05-01 DIAGNOSIS — N1832 Chronic kidney disease, stage 3b: Secondary | ICD-10-CM | POA: Diagnosis not present

## 2020-05-01 DIAGNOSIS — D631 Anemia in chronic kidney disease: Secondary | ICD-10-CM | POA: Diagnosis not present

## 2020-05-01 DIAGNOSIS — R809 Proteinuria, unspecified: Secondary | ICD-10-CM | POA: Diagnosis not present

## 2020-05-01 DIAGNOSIS — N2581 Secondary hyperparathyroidism of renal origin: Secondary | ICD-10-CM | POA: Diagnosis not present

## 2020-05-02 ENCOUNTER — Other Ambulatory Visit: Payer: Self-pay

## 2020-05-02 ENCOUNTER — Inpatient Hospital Stay: Payer: Medicare Other | Attending: Hematology and Oncology

## 2020-05-02 VITALS — BP 110/61 | HR 80 | Temp 96.0°F | Resp 16 | Wt 111.2 lb

## 2020-05-02 DIAGNOSIS — D45 Polycythemia vera: Secondary | ICD-10-CM | POA: Insufficient documentation

## 2020-05-02 MED ORDER — SODIUM CHLORIDE 0.9 % IV SOLN
Freq: Once | INTRAVENOUS | Status: AC
  Filled 2020-05-02: qty 250

## 2020-05-02 NOTE — Progress Notes (Signed)
Patient received prescribed treatment in clinic. 500 cc NS over 4 hrs. Tolerated well. Patient stable at discharge.

## 2020-05-12 ENCOUNTER — Other Ambulatory Visit: Payer: Self-pay

## 2020-05-12 DIAGNOSIS — D631 Anemia in chronic kidney disease: Secondary | ICD-10-CM

## 2020-05-12 DIAGNOSIS — N1832 Chronic kidney disease, stage 3b: Secondary | ICD-10-CM

## 2020-05-15 NOTE — Progress Notes (Signed)
[facility]  [address], [address] [place], [place] [address] Phone: [phone]  Fax: [phone]   Clinic Day:  05/16/2020   Referring physician: Glean Hess, MD  Chief Complaint: Laura Wilcox is a 87 y.o. female with polycythemia rubra vera (PV)on [name] who is seen for 1 month assessment.   HPI: The patient was last seen in the hematology clinic on 04/12/2020. At that time,  she had been "sleepy." She had  still been struggling to drink water.  She missed 1 dose of Jakafi a week.  Exam was stable. Hematocrit was 35.3, hemoglobin 10.9, MCV 91.0, platelets 578,000, WBC 8,500. Sodium was 134. BUN was 27 and creatinine was 1.17 (CrCl 44 ml/min). She continued Jakafi 10 mg in the mornings and 5 mg in the evenings. She was referred to dermatology for a second opinion.  The patient saw Dr. Holley Raring on 05/01/2020. Chronic kidney disease stage IIIb appeared to be fluctuating.  Most recent creatinine clearance was 34 ml/minute.  He recommended continuing IV fluids at the cancer center. Follow-up was planned in 4 months (around 08/29/2020).  Labs on 04/21/2020 revealed a hematocrit of 34.0, hemoglobin 10.8, MCV 90.2, platelets 548,000, WBC 9,200. Sodium was 134. Creatinine was 1.12 (CrCl 46 ml/min).  The patient received IV fluids on 04/21/2020 and 05/02/2020.  Regarding hydration, they aim for 5 glasses per day, but this only happens about half of the time. The patient forgets to mark the chart when she drinks.  During the interim, she has been fine. She was supposed to see Dr. Nehemiah Massed in dermatology but had to cancel the appointment. The patient will reschedule the appointment this afternoon.  The patient missed 3 Jakafi pills last week and 2 the week before.  Per patient, the patient has been loopy and very tired lately. She has been repeating questions. She reads fiction books. She is reading a book right now but states that she not know what it is about.   The patient's daughter cancelled her appointment with Dr. Manuella Ghazi this week. It is now scheduled for 07/31/2020.   Past Medical History:  Diagnosis Date  . Confusion 12/30/2018  . Polycythemia   . Urinary tract infection without hematuria 01/02/2020  . Urticaria 06/02/2017    Past Surgical History:  Procedure Laterality Date  . ABDOMINAL HYSTERECTOMY     complete  . BREAST SURGERY      Family History  Problem Relation Age of Onset  . Heart disease Father   . Cancer Sister        breast  . Cancer Sister        breast  . Alzheimer's disease Sister     Social History:  reports that she quit smoking about 42 years ago. She has a 30.00 pack-year smoking history. She has never used smokeless tobacco. She reports that she does not drink alcohol and does not use drugs. She is widowed. Patient is retired Sport and exercise psychologist. Patient moved to [facility] in 03/2017. She lives independently in her own apartment in [place]. Her daughter lives next door.Patient denies known exposures to radiation on toxins.Pt's daughter, [name] be reached at [phone]. She lives in [place].  The patient is accompanied by her daughter today.   Allergies: No Known Allergies  Current Medications: Current Outpatient Medications  Medication Sig Dispense Refill  . acetaminophen (TYLENOL) 325 MG tablet Take 650 mg by mouth every 6 (six) hours as needed.    . calcitRIOL (ROCALTROL) 0.25 MCG capsule Take 0.25 mcg by mouth  3 (three) times a week.     Marland Kitchen JAKAFI 5 MG tablet TAKE 1 TABLET (5 MG TOTAL) BY MOUTH 2 (TWO) TIMES DAILY. 60 tablet 0   No current facility-administered medications for this visit.   Facility-Administered Medications Ordered in Other Visits  Medication Dose Route Frequency Provider Last Rate Last Admin  . 0.9 %  sodium chloride infusion   Intravenous Once Lequita Asal, MD        Review of Systems  Constitutional: Negative for chills, diaphoresis, fever, malaise/fatigue and weight loss (up 1 lb).   HENT: Positive for hearing loss. Negative for congestion, ear discharge, ear pain, nosebleeds, sinus pain, sore throat and tinnitus.   Eyes: Negative.  Negative for blurred vision.  Respiratory: Negative.  Negative for cough, hemoptysis, sputum production and shortness of breath.   Cardiovascular: Negative.  Negative for chest pain, palpitations and leg swelling.  Gastrointestinal: Negative.  Negative for abdominal pain, blood in stool, constipation, diarrhea, heartburn, melena, nausea and vomiting.  Genitourinary: Negative.  Negative for dysuria, frequency, hematuria and urgency.  Musculoskeletal: Negative.  Negative for back pain, joint pain, myalgias and neck pain.  Skin: Negative.  Negative for itching and rash.  Neurological: Negative.  Negative for dizziness, tingling, sensory change, weakness and headaches.  Endo/Heme/Allergies: Negative.  Does not bruise/bleed easily.  Psychiatric/Behavioral: Positive for memory loss. Negative for depression. The patient is not nervous/anxious and does not have insomnia.   All other systems reviewed and are negative.  Performance status (ECOG):  2  Vitals Blood pressure (!) 142/70, pulse 76, temperature (!) 96.3 F (35.7 C), temperature source Tympanic, resp. rate 18, weight 112 lb 3.4 oz (50.9 kg).   Physical Exam Vitals and nursing note reviewed.  Constitutional:      General: She is not in acute distress.    Appearance: She is not diaphoretic.     Interventions: Face mask in place.  HENT:     Head: Normocephalic and atraumatic.     Comments: Short dark blonde hair.    Mouth/Throat:     Mouth: Mucous membranes are moist.     Pharynx: Oropharynx is clear.  Eyes:     General: No scleral icterus.    Extraocular Movements: Extraocular movements intact.     Conjunctiva/sclera: Conjunctivae normal.     Pupils: Pupils are equal, round, and reactive to light.     Comments: Blue eyes.  Cardiovascular:     Rate and Rhythm: Normal rate and  regular rhythm.     Heart sounds: Normal heart sounds. No murmur heard.   Pulmonary:     Effort: Pulmonary effort is normal. No respiratory distress.     Breath sounds: Normal breath sounds. No wheezing or rales.  Chest:     Chest wall: No tenderness.  Breasts:     Right: No axillary adenopathy or supraclavicular adenopathy.     Left: No axillary adenopathy or supraclavicular adenopathy.    Abdominal:     General: Bowel sounds are normal. There is no distension.     Palpations: Abdomen is soft. There is no hepatomegaly, splenomegaly or mass.     Tenderness: There is no abdominal tenderness. There is no guarding or rebound.  Musculoskeletal:        General: Tenderness (BLE) present. No swelling. Normal range of motion.     Cervical back: Normal range of motion and neck supple.  Lymphadenopathy:     Head:     Right side of head: No preauricular, posterior auricular  or occipital adenopathy.     Left side of head: No preauricular, posterior auricular or occipital adenopathy.     Cervical: No cervical adenopathy.     Upper Body:     Right upper body: No supraclavicular or axillary adenopathy.     Left upper body: No supraclavicular or axillary adenopathy.     Lower Body: No right inguinal adenopathy. No left inguinal adenopathy.  Skin:    General: Skin is warm and dry.  Neurological:     Mental Status: She is alert and oriented to person, place, and time. Mental status is at baseline.  Psychiatric:        Behavior: Behavior normal.        Judgment: Judgment normal.      01/04/2020     Appointment on 05/16/2020  Component Date Value Ref Range Status  . Sodium 05/16/2020 135  135 - 145 mmol/L Final  . Potassium 05/16/2020 4.0  3.5 - 5.1 mmol/L Final  . Chloride 05/16/2020 99  98 - 111 mmol/L Final  . CO2 05/16/2020 23  22 - 32 mmol/L Final  . Glucose, Bld 05/16/2020 68* 70 - 99 mg/dL Final   Glucose reference range applies only to samples taken after fasting for at least 8  hours.  . BUN 05/16/2020 33* 8 - 23 mg/dL Final  . Creatinine, Ser 05/16/2020 1.21* 0.44 - 1.00 mg/dL Final  . Calcium 05/16/2020 9.3  8.9 - 10.3 mg/dL Final  . Total Protein 05/16/2020 7.2  6.5 - 8.1 g/dL Final  . Albumin 05/16/2020 4.1  3.5 - 5.0 g/dL Final  . AST 05/16/2020 19  15 - 41 U/L Final  . ALT 05/16/2020 13  0 - 44 U/L Final  . Alkaline Phosphatase 05/16/2020 74  38 - 126 U/L Final  . Total Bilirubin 05/16/2020 0.3  0.3 - 1.2 mg/dL Final  . GFR, Estimated 05/16/2020 42* >60 mL/min Final   Comment: (NOTE) Calculated using the CKD-EPI Creatinine Equation (2021)   . Anion gap 05/16/2020 13  5 - 15 Final   Performed at [facility], [address], [place], [place] [address]  . WBC 05/16/2020 10.1  4.0 - 10.5 K/uL Final  . RBC 05/16/2020 3.85* 3.87 - 5.11 MIL/uL Final  . Hemoglobin 05/16/2020 10.8* 12.0 - 15.0 g/dL Final  . HCT 05/16/2020 34.6* 36.0 - 46.0 % Final  . MCV 05/16/2020 89.9  80.0 - 100.0 fL Final  . MCH 05/16/2020 28.1  26.0 - 34.0 pg Final  . MCHC 05/16/2020 31.2  30.0 - 36.0 g/dL Final  . RDW 05/16/2020 23.0* 11.5 - 15.5 % Final  . Platelets 05/16/2020 610* 150 - 400 K/uL Final  . nRBC 05/16/2020 0.3* 0.0 - 0.2 % Final  . Neutrophils Relative % 05/16/2020 64  % Final  . Neutro Abs 05/16/2020 6.5  1.7 - 7.7 K/uL Final  . Lymphocytes Relative 05/16/2020 16  % Final  . Lymphs Abs 05/16/2020 1.6  0.7 - 4.0 K/uL Final  . Monocytes Relative 05/16/2020 9  % Final  . Monocytes Absolute 05/16/2020 0.9  0.1 - 1.0 K/uL Final  . Eosinophils Relative 05/16/2020 4  % Final  . Eosinophils Absolute 05/16/2020 0.4  0.0 - 0.5 K/uL Final  . Basophils Relative 05/16/2020 2  % Final  . Basophils Absolute 05/16/2020 0.2* 0.0 - 0.1 K/uL Final  . Immature Granulocytes 05/16/2020 5  % Final  . Abs Immature Granulocytes 05/16/2020 0.47* 0.00 - 0.07 K/uL Final   Performed  at [facility], [address], [place], [place] [address]    Assessment:   Laura Wilcox is a 87 y.o. female with polycythemia rubra veraand secondary myelofibrosis. She presented in 04/2002 with thrombocytosis (835,000). JAK2 V617Fwas positive on 05/20/2012.  Over the past year,hematocrithas ranged 37.5 - 42.4,hemoglobin12.1 - 13.4, platelets458,000 - 569,000, WBC10,500 - 15,700. Creatinine has been 1.0 - 1.2.  Bone marrowon 04/15/2002 revealed a normocellular marrow for age with trilineage hematopoiesis and mild megakaryocytic hyperplasia with focal atypia. There was no morphologic evidence of a myeloproliferative disorder. Iron stains were inadequate for evaluation of storage iron. There was no increase in reticulin fibers. She has been on agrylin since 2012.  Bone marrowon 09/25/2012 revealed a persistent myeloproliferative disorder s/p Agrylin. The current features were best classified as persistent myeloproliferative neoplasm (MPN) JAK2 V617F mutation positive. Marrow was hypercellular for age (30-50%) with increased trilineage hematopoiesis and markedly increased atypical megakaryopoiesis with frequent clustering. There were no increased blasts. Iron stores were absent. There was moderate to severe myelofibrosis(grade MF 2-3). Differential included ET, PV, primary myelofibrosis, and MPN, unclassifiable. Cytogeneticswere normal (64, XX).  She developed pancytopeniain 12/2015. Etiology is unclear (medication confusion or hydroxyurea + Agrylin).  She received Procrit40,000 units (intermittently 09/2012 - 06/2016), Venofer(09/2012 - 10/2012), and Infed(12/2015 - 03/2016). She received Neupogenduring a period of neutropenia. She was anagrelidefrom 2004 - 08/13/2017. She beganJakafion 08/14/2017. Jakafi was increased from 5 mg BID to 10 mg BID on03/18/2020.Jakafi was increased to 15 mg BID on 08/03/2018.She has been back on Jakafi 5 mg BID since 02/01/2019 then 10 mg BID on 05/25/2019.  [name] was on hold from 12/20/2019 -  01/12/2020.  Jakafi 5 mg twice daily restarted on 01/12/2020 then increased to 10 mg in the morning and 5 mg in the evening on 02/02/2020.  She began a phlebotomy programon 08/06/2017. She undergoes small volume (150 cc with replacement of 150 cc NS)if her HCT >42.  Ferritinhas been followed: 529 on 09/03/2017, 1020 on 12/03/2017, 423 on 02/18/2018, 858 on 11/17/2018, 785 on 09/23/2019 and 728 on 12/20/2019.Iron saturationwas 37%and TIBC 270 on08/18/2020.B12 was 477 and folate 36 on 11/17/2018.  B12 was 292 and folate 19.1 on 12/20/2019  She was admitted to [facility] 12/17/2018 - 12/18/2018. She received 1 unit of PRBCs.CBC at discharge included a hematocrit of 29.0, hemoglobin 9.4, platelets 364,000, and WBC 10,200.  She received a course of Keflexfor a Proteus mirabilisUTIbeginning 01/11/2019.  Urine culture on 12/27/2019 revealed greater than 100,000 colonies of Enterobacter cloacae.  She was treated with Septra secondary to sensitivities.  The patient has received both doses of the COVID-19 vaccine.  Symptomatically, she has been loopy and very tired lately.  Memory is poor.  She is dehydrated.  She has missed some of her Jakafi.  Plan: 1.   Labs today: CBC with diff, CMP, B12. 2.Polycythemia rubra vera  Hematocrit 35.3.  Hemoglobin 10.9.  Platelets 578,000 on 04/12/2020.  Hematocrit 34.6.  Hemoglobin 10.8.  Platelets 610,000 on 05/16/2020. Platelet goal is < 400,000. She is currently on Jakafi 10 mg p.o. twice daily. She has missed several doses. 3.Renal insufficiency BUN 33 and creatinine 0.21 today. Patient requires encouragement to drink her fluid goal of 5 glasses a day. Encourage patient to keep water chart. IV fluids today. 4.Anemia             Hematocrit 34.6.  Hemoglobin 10.8. MCV 89.9.               Ferritin 728 with an iron  saturation of 26% and a TIBC 223 on 12/20/2019.             B12 was 292 (low normal) with a folate of 19.1 on  12/20/2019.              B12 goal is 400.             She received Retacrit on 12/24/2019.  Plan to continue Retacrit per protocol. 5.   Dementia             She has severe underlying dementia.             She has declined neurology consultation on multiple occasions.  Appointment with neurology Dr. Manuella Ghazi has been moved to 07/31/2020. 6.   Left cheek lesion  Patient appears to be squamous cell skin cancer.  She saw Dr. Lacinda Axon on 04/11/2020.   Concern was for biopsy with anesthesia.   Topical preparation (? 5FU) was discussed.  Patient's daughter is rescheduling a second opinion. 7.   Dehydration  BUN 33.  Creatinine 1.21 on 05/16/2020.  Encourage water chart and fluid goals.  IV fluid today  Patient's daughter encouraged to contact clinic if any interval concerns for dehydration. 8.   IVF today and tomorrow. 9.   RTC on Mondays and Wednesdays x 4 weeks. 54.   Patient's daughter to call if IVF needed on Fridays in [name]. 11.   RTC 06/12/2020 for MD assess, labs (CBC with diff, CMP, ferritin, iron studies), +/- Retacrit and +/- IVF.  I discussed the assessment and treatment plan with the patient.  The patient was provided an opportunity to ask questions and all were answered.  The patient agreed with the plan and demonstrated an understanding of the instructions.  The patient was advised to call back if the symptoms worsen or if the condition fails to improve as anticipated.   Lequita Asal, MD, PhD    05/16/2020, 11:26 AM   I, Mirian Mo Tufford, am acting as Education administrator for [employer]. Mike Gip, MD, PhD.  I, Glessie Eustice C. Mike Gip, MD, have reviewed the above documentation for accuracy and completeness, and I agree with the above.

## 2020-05-16 ENCOUNTER — Encounter: Payer: Self-pay | Admitting: Hematology and Oncology

## 2020-05-16 ENCOUNTER — Other Ambulatory Visit: Payer: Self-pay

## 2020-05-16 ENCOUNTER — Inpatient Hospital Stay: Payer: Medicare Other

## 2020-05-16 ENCOUNTER — Inpatient Hospital Stay (HOSPITAL_BASED_OUTPATIENT_CLINIC_OR_DEPARTMENT_OTHER): Payer: Medicare Other | Admitting: Hematology and Oncology

## 2020-05-16 VITALS — BP 142/70 | HR 76 | Temp 96.3°F | Resp 18 | Wt 112.2 lb

## 2020-05-16 DIAGNOSIS — C44329 Squamous cell carcinoma of skin of other parts of face: Secondary | ICD-10-CM | POA: Diagnosis not present

## 2020-05-16 DIAGNOSIS — D631 Anemia in chronic kidney disease: Secondary | ICD-10-CM

## 2020-05-16 DIAGNOSIS — E860 Dehydration: Secondary | ICD-10-CM

## 2020-05-16 DIAGNOSIS — Z7189 Other specified counseling: Secondary | ICD-10-CM

## 2020-05-16 DIAGNOSIS — D45 Polycythemia vera: Secondary | ICD-10-CM

## 2020-05-16 DIAGNOSIS — N1832 Chronic kidney disease, stage 3b: Secondary | ICD-10-CM

## 2020-05-16 DIAGNOSIS — R4189 Other symptoms and signs involving cognitive functions and awareness: Secondary | ICD-10-CM

## 2020-05-16 LAB — CBC WITH DIFFERENTIAL/PLATELET
Abs Immature Granulocytes: 0.47 10*3/uL — ABNORMAL HIGH (ref 0.00–0.07)
Basophils Absolute: 0.2 10*3/uL — ABNORMAL HIGH (ref 0.0–0.1)
Basophils Relative: 2 %
Eosinophils Absolute: 0.4 10*3/uL (ref 0.0–0.5)
Eosinophils Relative: 4 %
HCT: 34.6 % — ABNORMAL LOW (ref 36.0–46.0)
Hemoglobin: 10.8 g/dL — ABNORMAL LOW (ref 12.0–15.0)
Immature Granulocytes: 5 %
Lymphocytes Relative: 16 %
Lymphs Abs: 1.6 10*3/uL (ref 0.7–4.0)
MCH: 28.1 pg (ref 26.0–34.0)
MCHC: 31.2 g/dL (ref 30.0–36.0)
MCV: 89.9 fL (ref 80.0–100.0)
Monocytes Absolute: 0.9 10*3/uL (ref 0.1–1.0)
Monocytes Relative: 9 %
Neutro Abs: 6.5 10*3/uL (ref 1.7–7.7)
Neutrophils Relative %: 64 %
Platelets: 610 10*3/uL — ABNORMAL HIGH (ref 150–400)
RBC: 3.85 MIL/uL — ABNORMAL LOW (ref 3.87–5.11)
RDW: 23 % — ABNORMAL HIGH (ref 11.5–15.5)
WBC: 10.1 10*3/uL (ref 4.0–10.5)
nRBC: 0.3 % — ABNORMAL HIGH (ref 0.0–0.2)

## 2020-05-16 LAB — COMPREHENSIVE METABOLIC PANEL
ALT: 13 U/L (ref 0–44)
AST: 19 U/L (ref 15–41)
Albumin: 4.1 g/dL (ref 3.5–5.0)
Alkaline Phosphatase: 74 U/L (ref 38–126)
Anion gap: 13 (ref 5–15)
BUN: 33 mg/dL — ABNORMAL HIGH (ref 8–23)
CO2: 23 mmol/L (ref 22–32)
Calcium: 9.3 mg/dL (ref 8.9–10.3)
Chloride: 99 mmol/L (ref 98–111)
Creatinine, Ser: 1.21 mg/dL — ABNORMAL HIGH (ref 0.44–1.00)
GFR, Estimated: 42 mL/min — ABNORMAL LOW (ref >60)
Glucose, Bld: 68 mg/dL — ABNORMAL LOW (ref 70–99)
Potassium: 4 mmol/L (ref 3.5–5.1)
Sodium: 135 mmol/L (ref 135–145)
Total Bilirubin: 0.3 mg/dL (ref 0.3–1.2)
Total Protein: 7.2 g/dL (ref 6.5–8.1)

## 2020-05-16 MED ORDER — SODIUM CHLORIDE 0.9 % IV SOLN
Freq: Once | INTRAVENOUS | Status: AC
  Filled 2020-05-16: qty 250

## 2020-05-16 NOTE — Progress Notes (Signed)
Went in to recheck patients o2 level and daughter reported to me that the patient has been "a little loopy, asking questions like where are we going and very sleepy"

## 2020-05-16 NOTE — Patient Instructions (Signed)
  Increase Jakafi to 10 mg twice a day.

## 2020-05-16 NOTE — Progress Notes (Signed)
Patient received prescribed treatment in clinic. Tolerated well. Patient stable at discharge. 

## 2020-05-17 ENCOUNTER — Inpatient Hospital Stay: Payer: Medicare Other

## 2020-05-17 ENCOUNTER — Other Ambulatory Visit: Payer: Self-pay

## 2020-05-17 VITALS — BP 116/70 | HR 76 | Temp 96.0°F | Resp 16 | Wt 114.2 lb

## 2020-05-17 DIAGNOSIS — D45 Polycythemia vera: Secondary | ICD-10-CM | POA: Diagnosis not present

## 2020-05-17 DIAGNOSIS — E860 Dehydration: Secondary | ICD-10-CM

## 2020-05-17 MED ORDER — SODIUM CHLORIDE 0.9 % IV SOLN
Freq: Once | INTRAVENOUS | Status: AC
  Filled 2020-05-17: qty 250

## 2020-05-17 NOTE — Progress Notes (Signed)
Patient received prescribed treatment in clinic. 500 cc over 2 hours. Tolerated well. Patient stable at discharge.

## 2020-05-22 ENCOUNTER — Inpatient Hospital Stay: Payer: Medicare Other

## 2020-05-22 ENCOUNTER — Other Ambulatory Visit: Payer: Self-pay

## 2020-05-22 VITALS — BP 106/70 | HR 96 | Temp 96.9°F | Resp 18

## 2020-05-22 DIAGNOSIS — E860 Dehydration: Secondary | ICD-10-CM

## 2020-05-22 DIAGNOSIS — D45 Polycythemia vera: Secondary | ICD-10-CM | POA: Diagnosis not present

## 2020-05-22 MED ORDER — SODIUM CHLORIDE 0.9 % IV SOLN
Freq: Once | INTRAVENOUS | Status: AC
  Filled 2020-05-22: qty 250

## 2020-05-23 ENCOUNTER — Other Ambulatory Visit: Payer: Self-pay | Admitting: Hematology and Oncology

## 2020-05-24 ENCOUNTER — Inpatient Hospital Stay: Payer: Medicare Other

## 2020-05-24 ENCOUNTER — Other Ambulatory Visit: Payer: Self-pay

## 2020-05-24 VITALS — BP 116/69 | HR 71 | Temp 97.7°F | Resp 18

## 2020-05-24 DIAGNOSIS — D45 Polycythemia vera: Secondary | ICD-10-CM | POA: Diagnosis not present

## 2020-05-24 DIAGNOSIS — E860 Dehydration: Secondary | ICD-10-CM

## 2020-05-24 MED ORDER — SODIUM CHLORIDE 0.9 % IV SOLN
Freq: Once | INTRAVENOUS | Status: AC
  Filled 2020-05-24: qty 250

## 2020-05-25 ENCOUNTER — Ambulatory Visit (INDEPENDENT_AMBULATORY_CARE_PROVIDER_SITE_OTHER): Payer: Medicare Other | Admitting: Dermatology

## 2020-05-25 ENCOUNTER — Telehealth: Payer: Self-pay | Admitting: Pharmacy Technician

## 2020-05-25 ENCOUNTER — Other Ambulatory Visit: Payer: Self-pay | Admitting: Hematology and Oncology

## 2020-05-25 ENCOUNTER — Other Ambulatory Visit: Payer: Self-pay

## 2020-05-25 DIAGNOSIS — D0439 Carcinoma in situ of skin of other parts of face: Secondary | ICD-10-CM | POA: Diagnosis not present

## 2020-05-25 DIAGNOSIS — L578 Other skin changes due to chronic exposure to nonionizing radiation: Secondary | ICD-10-CM | POA: Diagnosis not present

## 2020-05-25 DIAGNOSIS — L820 Inflamed seborrheic keratosis: Secondary | ICD-10-CM | POA: Diagnosis not present

## 2020-05-25 DIAGNOSIS — L570 Actinic keratosis: Secondary | ICD-10-CM

## 2020-05-25 DIAGNOSIS — D099 Carcinoma in situ, unspecified: Secondary | ICD-10-CM

## 2020-05-25 MED ORDER — MUPIROCIN 2 % EX OINT: TOPICAL_OINTMENT | CUTANEOUS | 0 refills | Status: DC

## 2020-05-25 MED FILL — JAKAFI 10 MG TABLET: 10 | 30 days supply | Qty: 60 | Fill #0

## 2020-05-25 NOTE — Telephone Encounter (Signed)
Oral Oncology Patient Advocate Encounter   Was successful in securing patient a second grant in the amount of $8700 from Patient [employer] [facility]) to provide copayment coverage for Jakafi.  This will keep the out of pocket expense at $0 until her current grant expires.     The billing information is as follows and has been shared with [place].   Member ID: [id number] Group ID: [id number] RxBin: [id number] Dates of Eligibility: 05/25/20 through 08/07/20  Fund:  [place] Negative Myeloproliferative Neoplasms  [place] Patient [place] Phone [phone] Fax [phone] 05/25/2020 4:17 PM

## 2020-05-25 NOTE — Patient Instructions (Addendum)
Cryotherapy Aftercare  . Wash gently with soap and water everyday.   Marland Kitchen Apply Mupirocin ointment and Band-Aid daily until healed. Marland Kitchen

## 2020-05-25 NOTE — Progress Notes (Signed)
New Patient Visit  Subjective  Laura Wilcox is a 87 y.o. female who presents for the following: Skin Problem (Pt c/o growth on the L cheek she had biopsied by a different dermatologist biopsy showed SCCis pt was referred for Mohs surgery, pt was not able to get surgery on the [facility] by Dr Lacinda Axon due to allergy to [place], pt was instructed by Dr Lacinda Axon to come here to discuss topical treatment for biopsy proven SCCis ).  Daughter with pt and contributes to history.  The following portions of the chart were reviewed this encounter and updated as appropriate:   Tobacco  Allergies  Meds  Problems  Med Hx  Surg Hx  Fam Hx     Review of Systems:  No other skin or systemic complaints except as noted in HPI or Assessment and Plan.  Objective  Well appearing patient in no apparent distress; mood and affect are within normal limits.  A focused examination was performed including face. Relevant physical exam findings are noted in the Assessment and Plan.  Objective  L temple zygoma: 2.5 cm Erythematous keratotic or waxy stuck-on papule or plaque at L temple zygoma    Objective  L neck: Erythematous thin papules/macules with gritty scale.   Objective  L medial cheek: 4.2 x 2.2 Keratotic pink papule/nodule or plaque.    Assessment & Plan  Inflamed seborrheic keratosis L temple zygoma  Destruction of lesion - L temple zygoma Complexity: simple   Destruction method: cryotherapy   Informed consent: discussed and consent obtained   Timeout:  patient name, date of birth, surgical site, and procedure verified Lesion destroyed using liquid nitrogen: Yes   Region frozen until ice ball extended beyond lesion: Yes   Outcome: patient tolerated procedure well with no complications   Post-procedure details: wound care instructions given    AK (actinic keratosis) L neck  Destruction of lesion - L neck Complexity: simple   Destruction method: cryotherapy   Informed consent: discussed  and consent obtained   Timeout:  patient name, date of birth, surgical site, and procedure verified Lesion destroyed using liquid nitrogen: Yes   Region frozen until ice ball extended beyond lesion: Yes   Outcome: patient tolerated procedure well with no complications   Post-procedure details: wound care instructions given    Squamous cell carcinoma in situ (SCCIS) L medial cheek  Destruction of lesion Complexity: extensive   Destruction method: cryotherapy   Informed consent: discussed and consent obtained   Timeout:  patient name, date of birth, surgical site, and procedure verified Procedure prep:  Patient was prepped and draped in usual sterile fashion Prep type:  Isopropyl alcohol Anesthesia: the lesion was anesthetized in a standard fashion   Anesthetic:  1% lidocaine w/ epinephrine 1-100,000 buffered w/ 8.4% NaHCO3 Final wound size (cm):  4.2 Hemostasis achieved with:  pressure, aluminum chloride and electrodesiccation Outcome: patient tolerated procedure well with no complications   Post-procedure details: sterile dressing applied and wound care instructions given   Dressing type: bandage and petrolatum    Plan on treating with topical 5FU/Calcipotriene   Ordered Medications: mupirocin ointment (BACTROBAN) 2 %  Actinic Damage - chronic, secondary to cumulative UV radiation exposure/sun exposure over time - diffuse scaly erythematous macules with underlying dyspigmentation - Recommend daily broad spectrum sunscreen SPF 30+ to sun-exposed areas, reapply every 2 hours as needed.  - Call for new or changing lesions.  Return in about 6 weeks (around 07/06/2020) for SCCIS.  Marta Lamas, CMA, am  acting as scribe for Sarina Ser, MD .  Documentation: I have reviewed the above documentation for accuracy and completeness, and I agree with the above.  Sarina Ser, MD

## 2020-05-27 ENCOUNTER — Encounter: Payer: Self-pay | Admitting: Dermatology

## 2020-05-29 ENCOUNTER — Other Ambulatory Visit: Payer: Self-pay

## 2020-05-29 ENCOUNTER — Inpatient Hospital Stay: Payer: Medicare Other

## 2020-05-29 DIAGNOSIS — D45 Polycythemia vera: Secondary | ICD-10-CM | POA: Diagnosis not present

## 2020-05-29 DIAGNOSIS — E860 Dehydration: Secondary | ICD-10-CM

## 2020-05-29 MED ORDER — SODIUM CHLORIDE 0.9 % IV SOLN
INTRAVENOUS | Status: AC
  Filled 2020-05-29 (×2): qty 250

## 2020-05-29 NOTE — Progress Notes (Signed)
Pt received prescribed treatment in clinic, pt stable at d/c. 

## 2020-05-31 ENCOUNTER — Other Ambulatory Visit: Payer: Self-pay

## 2020-05-31 ENCOUNTER — Inpatient Hospital Stay: Payer: Medicare Other | Attending: Hematology and Oncology

## 2020-05-31 VITALS — BP 110/70 | HR 78 | Temp 97.6°F | Resp 18

## 2020-05-31 DIAGNOSIS — D631 Anemia in chronic kidney disease: Secondary | ICD-10-CM | POA: Insufficient documentation

## 2020-05-31 DIAGNOSIS — E860 Dehydration: Secondary | ICD-10-CM | POA: Insufficient documentation

## 2020-05-31 DIAGNOSIS — C44329 Squamous cell carcinoma of skin of other parts of face: Secondary | ICD-10-CM | POA: Diagnosis not present

## 2020-05-31 DIAGNOSIS — N1832 Chronic kidney disease, stage 3b: Secondary | ICD-10-CM | POA: Diagnosis not present

## 2020-05-31 DIAGNOSIS — D45 Polycythemia vera: Secondary | ICD-10-CM | POA: Diagnosis not present

## 2020-05-31 DIAGNOSIS — G3184 Mild cognitive impairment, so stated: Secondary | ICD-10-CM | POA: Diagnosis not present

## 2020-05-31 DIAGNOSIS — Z79899 Other long term (current) drug therapy: Secondary | ICD-10-CM | POA: Diagnosis not present

## 2020-05-31 MED ORDER — SODIUM CHLORIDE 0.9 % IV SOLN
Freq: Once | INTRAVENOUS | Status: AC
  Filled 2020-05-31: qty 250

## 2020-05-31 NOTE — Progress Notes (Signed)
Patient received prescribed treatment in clinic. Tolerated well. Patient stable at discharge. 

## 2020-06-01 ENCOUNTER — Encounter: Payer: Self-pay | Admitting: Dermatology

## 2020-06-01 ENCOUNTER — Other Ambulatory Visit: Payer: Self-pay

## 2020-06-01 ENCOUNTER — Ambulatory Visit (INDEPENDENT_AMBULATORY_CARE_PROVIDER_SITE_OTHER): Payer: Medicare Other | Admitting: Dermatology

## 2020-06-01 DIAGNOSIS — L03211 Cellulitis of face: Secondary | ICD-10-CM

## 2020-06-01 DIAGNOSIS — Z86007 Personal history of in-situ neoplasm of skin: Secondary | ICD-10-CM | POA: Diagnosis not present

## 2020-06-01 DIAGNOSIS — R234 Changes in skin texture: Secondary | ICD-10-CM | POA: Diagnosis not present

## 2020-06-01 MED ORDER — DOXYCYCLINE MONOHYDRATE 100 MG PO CAPS: 100.0000 mg | ORAL_CAPSULE | Freq: Two times a day (BID) | ORAL | 0 refills | Status: AC

## 2020-06-01 NOTE — Progress Notes (Signed)
   Follow-Up Visit   Subjective  Laura Wilcox is a 87 y.o. female who presents for the following: SCCIS edc Wilcox pt concerned if infected (L medial cheek,  mupirocin oint bid, pt said draining yellow crust). Patient accompanied by daughter who contributes to history.  The following portions of the chart were reviewed this encounter and updated as appropriate:   Tobacco  Allergies  Meds  Problems  Med Hx  Surg Hx  Fam Hx     Review of Systems:  No other skin or systemic complaints except as noted in HPI or Assessment and Plan.  Objective  Well appearing patient in no apparent distress; mood and affect are within normal limits.  A focused examination was performed including face. Relevant physical exam findings are noted in the Assessment and Plan.  Objective  L medial cheek: Healing edc Wilcox with crust   Assessment & Plan  History of squamous cell carcinoma in situ (SCCIS) of skin With cellulitis and inflammation and weeping and crusting. L medial cheek Healing LN2 destruction Wilcox with weeping and crusting.  Mechanical debridement today, wound cleansed with Puracyn. Mupriocin applied and Band-Aid  Start Doxycycline 100mg  1 po bid with food and drink for 5 days Cont Mupirocin oint qd/bid   doxycycline (MONODOX) 100 MG capsule - L medial cheek  Return in about 5 days (around 06/06/2020) for recheck Laura Wilcox.  I, Othelia Pulling, RMA, am acting as scribe for Sarina Ser, MD .  Documentation: I have reviewed the above documentation for accuracy and completeness, and I agree with the above.  Sarina Ser, MD

## 2020-06-05 ENCOUNTER — Other Ambulatory Visit: Payer: Self-pay

## 2020-06-05 ENCOUNTER — Inpatient Hospital Stay: Payer: Medicare Other

## 2020-06-05 VITALS — BP 126/62 | HR 70 | Temp 96.9°F | Resp 18

## 2020-06-05 DIAGNOSIS — C44329 Squamous cell carcinoma of skin of other parts of face: Secondary | ICD-10-CM | POA: Diagnosis not present

## 2020-06-05 DIAGNOSIS — D45 Polycythemia vera: Secondary | ICD-10-CM | POA: Diagnosis not present

## 2020-06-05 DIAGNOSIS — N1832 Chronic kidney disease, stage 3b: Secondary | ICD-10-CM | POA: Diagnosis not present

## 2020-06-05 DIAGNOSIS — E860 Dehydration: Secondary | ICD-10-CM | POA: Diagnosis not present

## 2020-06-05 DIAGNOSIS — D631 Anemia in chronic kidney disease: Secondary | ICD-10-CM | POA: Diagnosis not present

## 2020-06-05 DIAGNOSIS — G3184 Mild cognitive impairment, so stated: Secondary | ICD-10-CM | POA: Diagnosis not present

## 2020-06-05 MED ORDER — SODIUM CHLORIDE 0.9 % IV SOLN
Freq: Once | INTRAVENOUS | Status: AC
  Filled 2020-06-05: qty 250

## 2020-06-06 ENCOUNTER — Ambulatory Visit: Payer: Medicare Other | Admitting: Dermatology

## 2020-06-07 ENCOUNTER — Other Ambulatory Visit: Payer: Self-pay

## 2020-06-07 ENCOUNTER — Inpatient Hospital Stay: Payer: Medicare Other

## 2020-06-07 VITALS — BP 100/64 | HR 78 | Resp 18

## 2020-06-07 DIAGNOSIS — D631 Anemia in chronic kidney disease: Secondary | ICD-10-CM | POA: Diagnosis not present

## 2020-06-07 DIAGNOSIS — E860 Dehydration: Secondary | ICD-10-CM

## 2020-06-07 DIAGNOSIS — N1832 Chronic kidney disease, stage 3b: Secondary | ICD-10-CM | POA: Diagnosis not present

## 2020-06-07 DIAGNOSIS — G3184 Mild cognitive impairment, so stated: Secondary | ICD-10-CM | POA: Diagnosis not present

## 2020-06-07 DIAGNOSIS — D45 Polycythemia vera: Secondary | ICD-10-CM | POA: Diagnosis not present

## 2020-06-07 DIAGNOSIS — C44329 Squamous cell carcinoma of skin of other parts of face: Secondary | ICD-10-CM | POA: Diagnosis not present

## 2020-06-07 MED ORDER — SODIUM CHLORIDE 0.9 % IV SOLN
Freq: Once | INTRAVENOUS | Status: AC
  Filled 2020-06-07: qty 250

## 2020-06-07 NOTE — Progress Notes (Signed)
Pt received prescribed treatment in clinic, pt stable at d/c. 

## 2020-06-08 NOTE — Progress Notes (Signed)
[facility]  [address], [address] [place], [place] [address] Phone: [phone]  Fax: [phone]   Clinic Day:  06/12/2020   Referring physician: Glean Hess, MD  Chief Complaint: Charletta Voight is a 87 y.o. female with polycythemia rubra vera (PV)on [name] who is seen for 1 month assessment.   HPI: The patient was last seen in the hematology clinic on 05/16/2020. At that time, she had been loopy and very tired lately.  Memory was poor.  She was dehydrated.  She had missed some of her Jakafi. Hematocrit was 34.6, hemoglobin 10.8, MCV 89.9, platelets 610,000, WBC 10,100. BUN was 33. Creatinine was 1.21 (CrCl 42 ml/min). She received IV fluids.  She received IV fluids on 05/17/2020, 05/22/2020, 05/24/2020, 05/29/2020, 06/01/2020, 06/05/2020, and 06/07/2020.  The patient saw Dr. Nehemiah Massed on 06/01/2020. 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  The left cheek site was healing with weeping and crusting. The area was mechanically debrided and cleansed with Puracyn. She was prescribed doxycycline 100 mg BID x 5 days.  Follow-up on 06/06/2020 was cancelled; she has a follow-up appointment on 07/12/2020.  During the interim, she has been fine. She drank 3 glasses of water yesterday while her daughter was with her. Over the past week, there were three days when she drank 5 glasses of water. She has taken every dose of Jakafi this week. She originally forgot to increase Jakafi to 20 mg BID but she started taking this dose two weeks ago.  She has middle back pain. She denies any falls.  Her memory and cognitive ability fluctuate day by day.   Past Medical History:  Diagnosis Date  . Confusion 12/30/2018  . Polycythemia   . Urinary tract infection without hematuria 01/02/2020  . Urticaria 06/02/2017    Past Surgical History:  Procedure Laterality Date  . ABDOMINAL HYSTERECTOMY      complete  . BREAST SURGERY      Family History  Problem Relation Age of Onset  . Heart disease Father   . Cancer Sister        breast  . Cancer Sister        breast  . Alzheimer's disease Sister     Social History:  reports that she quit smoking about 42 years ago. She has a 30.00 pack-year smoking history. She has never used smokeless tobacco. She reports that she does not drink alcohol and does not use drugs. She is widowed. Patient is retired Sport and exercise psychologist. Patient moved to [facility] in 03/2017. She lives independently in her own apartment in [place]. Her daughter lives next door.Patient denies known exposures to radiation on toxins.Pt's daughter, [name] be reached at [phone]. She lives in [place].  The patient is accompanied by her daughter today.   Allergies: No Known Allergies  Current Medications: Current Outpatient Medications  Medication Sig Dispense Refill  . acetaminophen (TYLENOL) 325 MG tablet Take 650 mg by mouth every 6 (six) hours as needed.    . calcitRIOL (ROCALTROL) 0.25 MCG capsule Take 0.25 mcg by mouth 3 (three) times a week.     . mupirocin ointment (BACTROBAN) 2 % Apply to skin qd-bid 22 g 0  . ruxolitinib phosphate (JAKAFI) 10 MG tablet Take 1 tablet (10 mg total) by mouth 2 (two) times daily. 60 tablet 1   No current facility-administered medications for this visit.    Review of Systems  Constitutional: Positive for weight loss (  2 lbs). Negative for chills, diaphoresis, fever and malaise/fatigue.  HENT: Positive for hearing loss. Negative for congestion, ear discharge, ear pain, nosebleeds, sinus pain, sore throat and tinnitus.   Eyes: Negative.  Negative for blurred vision.  Respiratory: Negative.  Negative for cough, hemoptysis, sputum production and shortness of breath.   Cardiovascular: Negative.  Negative for chest pain, palpitations and leg swelling.  Gastrointestinal: Negative.  Negative for abdominal pain, blood in stool, constipation, diarrhea,  heartburn, melena, nausea and vomiting.  Genitourinary: Negative.  Negative for dysuria, frequency, hematuria and urgency.  Musculoskeletal: Positive for back pain. Negative for joint pain, myalgias and neck pain.  Skin: Negative.  Negative for itching and rash.  Neurological: Negative.  Negative for dizziness, tingling, sensory change, weakness and headaches.  Endo/Heme/Allergies: Negative.  Does not bruise/bleed easily.  Psychiatric/Behavioral: Positive for memory loss. Negative for depression. The patient is not nervous/anxious and does not have insomnia.   All other systems reviewed and are negative.  Performance status (ECOG):  2-3  Vitals Blood pressure 123/61, pulse 87, temperature 97.7 F (36.5 C), resp. rate 20, weight 110 lb 3.7 oz (50 kg), SpO2 99 %.   Physical Exam Vitals and nursing note reviewed.  Constitutional:      General: She is not in acute distress.    Appearance: She is not diaphoretic.     Interventions: Face mask in place.  HENT:     Head: Normocephalic and atraumatic.     Comments: Short dark blonde hair.    Mouth/Throat:     Mouth: Mucous membranes are moist.     Pharynx: Oropharynx is clear.  Eyes:     General: No scleral icterus.    Extraocular Movements: Extraocular movements intact.     Conjunctiva/sclera: Conjunctivae normal.     Pupils: Pupils are equal, round, and reactive to light.     Comments: Blue eyes.  Cardiovascular:     Rate and Rhythm: Normal rate and regular rhythm.     Heart sounds: Normal heart sounds. No murmur heard.   Pulmonary:     Effort: Pulmonary effort is normal. No respiratory distress.     Breath sounds: Normal breath sounds. No wheezing or rales.  Chest:     Chest wall: No tenderness.  Breasts:     Right: No axillary adenopathy or supraclavicular adenopathy.     Left: No axillary adenopathy or supraclavicular adenopathy.    Abdominal:     General: Bowel sounds are normal. There is no distension.     Palpations:  Abdomen is soft. There is no hepatomegaly, splenomegaly or mass.     Tenderness: There is no abdominal tenderness. There is no guarding or rebound.  Musculoskeletal:        General: Tenderness (paraspinal) present. No swelling. Normal range of motion.     Cervical back: Normal range of motion and neck supple.  Lymphadenopathy:     Head:     Right side of head: No preauricular, posterior auricular or occipital adenopathy.     Left side of head: No preauricular, posterior auricular or occipital adenopathy.     Cervical: No cervical adenopathy.     Upper Body:     Right upper body: No supraclavicular or axillary adenopathy.     Left upper body: No supraclavicular or axillary adenopathy.     Lower Body: No right inguinal adenopathy. No left inguinal adenopathy.  Skin:    General: Skin is warm and dry.  Neurological:     Mental Status:  She is alert and oriented to person, place, and time. Mental status is at baseline.  Psychiatric:        Behavior: Behavior normal.        Judgment: Judgment normal.      01/04/2020     Appointment on 06/12/2020  Component Date Value Ref Range Status  . Sodium 06/12/2020 136  135 - 145 mmol/L Final  . Potassium 06/12/2020 4.6  3.5 - 5.1 mmol/L Final  . Chloride 06/12/2020 100  98 - 111 mmol/L Final  . CO2 06/12/2020 24  22 - 32 mmol/L Final  . Glucose, Bld 06/12/2020 115* 70 - 99 mg/dL Final   Glucose reference range applies only to samples taken after fasting for at least 8 hours.  . BUN 06/12/2020 36* 8 - 23 mg/dL Final  . Creatinine, Ser 06/12/2020 1.38* 0.44 - 1.00 mg/dL Final  . Calcium 06/12/2020 9.4  8.9 - 10.3 mg/dL Final  . Total Protein 06/12/2020 7.0  6.5 - 8.1 g/dL Final  . Albumin 06/12/2020 4.1  3.5 - 5.0 g/dL Final  . AST 06/12/2020 24  15 - 41 U/L Final  . ALT 06/12/2020 14  0 - 44 U/L Final  . Alkaline Phosphatase 06/12/2020 84  38 - 126 U/L Final  . Total Bilirubin 06/12/2020 0.5  0.3 - 1.2 mg/dL Final  . GFR, Estimated  06/12/2020 36* >60 mL/min Final   Comment: (NOTE) Calculated using the CKD-EPI Creatinine Equation (2021)   . Anion gap 06/12/2020 12  5 - 15 Final   Performed at [facility], [address] [place], [place] [address]  . WBC 06/12/2020 11.3* 4.0 - 10.5 K/uL Final  . RBC 06/12/2020 3.55* 3.87 - 5.11 MIL/uL Final  . Hemoglobin 06/12/2020 10.0* 12.0 - 15.0 g/dL Final  . HCT 06/12/2020 31.8* 36.0 - 46.0 % Final  . MCV 06/12/2020 89.6  80.0 - 100.0 fL Final  . MCH 06/12/2020 28.2  26.0 - 34.0 pg Final  . MCHC 06/12/2020 31.4  30.0 - 36.0 g/dL Final  . RDW 06/12/2020 23.4* 11.5 - 15.5 % Final  . Platelets 06/12/2020 595* 150 - 400 K/uL Final  . nRBC 06/12/2020 0.2  0.0 - 0.2 % Final   Performed at [facility], [address] [place],  [address]  . Neutrophils Relative % 06/12/2020 PENDING  % Incomplete  . Neutro Abs 06/12/2020 PENDING  1.7 - 7.7 K/uL Incomplete  . Band Neutrophils 06/12/2020 PENDING  % Incomplete  . Lymphocytes Relative 06/12/2020 PENDING  % Incomplete  . Lymphs Abs 06/12/2020 PENDING  0.7 - 4.0 K/uL Incomplete  . Monocytes Relative 06/12/2020 PENDING  % Incomplete  . Monocytes Absolute 06/12/2020 PENDING  0.1 - 1.0 K/uL Incomplete  . Eosinophils Relative 06/12/2020 PENDING  % Incomplete  . Eosinophils Absolute 06/12/2020 PENDING  0.0 - 0.5 K/uL Incomplete  . Basophils Relative 06/12/2020 PENDING  % Incomplete  . Basophils Absolute 06/12/2020 PENDING  0.0 - 0.1 K/uL Incomplete  . WBC Morphology 06/12/2020 PENDING   Incomplete  . RBC Morphology 06/12/2020 PENDING   Incomplete  . Smear Review 06/12/2020 PENDING   Incomplete  . Other 06/12/2020 PENDING  % Incomplete  . nRBC 06/12/2020 PENDING  0 /100 WBC Incomplete  . Metamyelocytes Relative 06/12/2020 PENDING  % Incomplete  . Myelocytes 06/12/2020 PENDING  % Incomplete  . Promyelocytes Relative 06/12/2020 PENDING  % Incomplete  . Blasts 06/12/2020 PENDING  % Incomplete  . Immature  Granulocytes 06/12/2020 PENDING  %  Incomplete  . Abs Immature Granulocytes 06/12/2020 PENDING  0.00 - 0.07 K/uL Incomplete    Assessment:  Kennis Buell is a 87 y.o. female with polycythemia rubra veraand secondary myelofibrosis. She presented in 04/2002 with thrombocytosis (835,000). JAK2 V617Fwas positive on 05/20/2012.  Over the past year,hematocrithas ranged 37.5 - 42.4,hemoglobin12.1 - 13.4, platelets458,000 - 569,000, WBC10,500 - 15,700. Creatinine has been 1.0 - 1.2.  Bone marrowon 04/15/2002 revealed a normocellular marrow for age with trilineage hematopoiesis and mild megakaryocytic hyperplasia with focal atypia. There was no morphologic evidence of a myeloproliferative disorder. Iron stains were inadequate for evaluation of storage iron. There was no increase in reticulin fibers. She has been on agrylin since 2012.  Bone marrowon 09/25/2012 revealed a persistent myeloproliferative disorder s/p Agrylin. The current features were best classified as persistent myeloproliferative neoplasm (MPN) JAK2 V617F mutation positive. Marrow was hypercellular for age (30-50%) with increased trilineage hematopoiesis and markedly increased atypical megakaryopoiesis with frequent clustering. There were no increased blasts. Iron stores were absent. There was moderate to severe myelofibrosis(grade MF 2-3). Differential included ET, PV, primary myelofibrosis, and MPN, unclassifiable. Cytogeneticswere normal (20, XX).  She developed pancytopeniain 12/2015. Etiology is unclear (medication confusion or hydroxyurea + Agrylin).  She received Procrit40,000 units (intermittently 09/2012 - 06/2016), Venofer(09/2012 - 10/2012), and Infed(12/2015 - 03/2016). She received Neupogenduring a period of neutropenia. She was anagrelidefrom 2004 - 08/13/2017. She beganJakafion 08/14/2017. Jakafi was increased from 5 mg BID to 10 mg BID on03/18/2020.Jakafi was increased to 15 mg BID  on 08/03/2018.She has been back on Jakafi 5 mg BID since 02/01/2019 then 10 mg BID on 05/25/2019.  [name] was on hold from 12/20/2019 - 01/12/2020.  Jakafi 5 mg twice daily restarted on 01/12/2020 then increased to 10 mg in the morning and 5 mg in the evening on 02/02/2020.  She began a phlebotomy programon 08/06/2017. She undergoes small volume (150 cc with replacement of 150 cc NS)if her HCT >42.  Ferritinhas been followed: 529 on 09/03/2017, 1020 on 12/03/2017, 423 on 02/18/2018, 858 on 11/17/2018, 785 on 09/23/2019 and 728 on 12/20/2019.Iron saturationwas 37%and TIBC 270 on08/18/2020.B12 was 477 and folate 36 on 11/17/2018.  B12 was 292 and folate 19.1 on 12/20/2019  She was admitted to [facility] 12/17/2018 - 12/18/2018. She received 1 unit of PRBCs.CBC at discharge included a hematocrit of 29.0, hemoglobin 9.4, platelets 364,000, and WBC 10,200.  She received a course of Keflexfor a Proteus mirabilisUTIbeginning 01/11/2019.  Urine culture on 12/27/2019 revealed greater than 100,000 colonies of Enterobacter cloacae.  She was treated with Septra secondary to sensitivities.  The patient has received both doses of the COVID-19 vaccine.  Symptomatically,she struggles to drink 5 glasses of water a day.  She has paraspinal muscle tenderness.   Plan: 1.   Labs today: CBC with diff, CMP, ferritin, iron studies. 2.Polycythemia rubra vera  Hematocrit 35.3.  Hemoglobin 10.9.  Platelets 578,000 on 04/12/2020.  Hematocrit 34.6.  Hemoglobin 10.8.  Platelets 610,000 on 05/16/2020.  Hematocrit 31.8.  Hemoglobin 10.0.  Platelets 595,000 on 06/12/2020. Platelet goal is < 400,000. She increased her Jakafi to 20 mg twice daily 2 weeks prior. Continue to monitor hemoglobin closely with increased Jakafi. 3.Renal insufficiency BUN 36 and creatinine 1.21 today. Patient requires daily encouragement to drink her fluid goal of 5 glasses a day. Continue need to maintain on water  chart.. IV fluids today. 4.Anemia             Hematocrit 31.8.  Hemoglobin 10.0. MCV 89.6.  Ferritin 640 with an iron saturation of 46 % and a TIBC 259.             B12 was 292 (low normal) with a folate of 19.1 on 12/20/2019.              B12 goal is 400.             She received Retacrit on 12/24/2019.  Anticipate need for Retacrit with higher dose of Jakafi to maintain hemoglobin > 10. 5.   Dementia             She has severe underlying dementia.             She has declined neurology consultation on multiple occasions.  She has an appointment with Dr. Manuella Ghazi on 07/31/2020. 6.   Left cheek lesion  Patient appears to be squamous cell skin cancer.  Review interim appointment with Dr. Nehemiah Massed.  She has a follow-up appointment on 07/12/2020. 7.   Dehydration  BUN 36.  Creatinine 1.38 today.  Encourage water chart and fluid goals.  IF fluids today.  Patient's daughter encouraged to contact clinic if any interval concerns for dehydration. 8.   No Retacrit today. 9.   IVF 250 cc/hr x 2 hours today. 10.   RTC on Mondays and Wednesdays for IVF 250 cc/hr x 2 hours. 11.   RTC in 2 weeks for labs (CBC with diff) +/- Retacrit. 12.   RTC in 4 weeks for MD assessment, labs (CBC with diff, CMP)), IVF, and +/- Retacrit.  I discussed the assessment and treatment plan with the patient.  The patient was provided an opportunity to ask questions and all were answered.  The patient agreed with the plan and demonstrated an understanding of the instructions.  The patient was advised to call back if the symptoms worsen or if the condition fails to improve as anticipated.  I provided 24 minutes of face-to-face time during this this encounter and > 50% was spent counseling as documented under my assessment and plan.  An additional 5 minutes were spent reviewing her chart (Epic and Care Everywhere) including notes, labs, and imaging studies.    Lequita Asal, MD, PhD    06/12/2020, 2:18  PM   I, Mirian Mo Tufford, am acting as Education administrator for [employer]. Mike Gip, MD, PhD.  I, Yaacov Koziol C. Mike Gip, MD, have reviewed the above documentation for accuracy and completeness, and I agree with the above.

## 2020-06-12 ENCOUNTER — Inpatient Hospital Stay: Payer: Medicare Other

## 2020-06-12 ENCOUNTER — Encounter: Payer: Self-pay | Admitting: Hematology and Oncology

## 2020-06-12 ENCOUNTER — Other Ambulatory Visit: Payer: Self-pay

## 2020-06-12 ENCOUNTER — Inpatient Hospital Stay (HOSPITAL_BASED_OUTPATIENT_CLINIC_OR_DEPARTMENT_OTHER): Payer: Medicare Other | Admitting: Hematology and Oncology

## 2020-06-12 VITALS — BP 123/61 | HR 87 | Temp 97.7°F | Resp 20 | Wt 110.2 lb

## 2020-06-12 DIAGNOSIS — N1832 Chronic kidney disease, stage 3b: Secondary | ICD-10-CM | POA: Diagnosis not present

## 2020-06-12 DIAGNOSIS — E860 Dehydration: Secondary | ICD-10-CM | POA: Diagnosis not present

## 2020-06-12 DIAGNOSIS — R4189 Other symptoms and signs involving cognitive functions and awareness: Secondary | ICD-10-CM

## 2020-06-12 DIAGNOSIS — D45 Polycythemia vera: Secondary | ICD-10-CM

## 2020-06-12 DIAGNOSIS — C44329 Squamous cell carcinoma of skin of other parts of face: Secondary | ICD-10-CM

## 2020-06-12 DIAGNOSIS — D631 Anemia in chronic kidney disease: Secondary | ICD-10-CM

## 2020-06-12 DIAGNOSIS — G3184 Mild cognitive impairment, so stated: Secondary | ICD-10-CM | POA: Diagnosis not present

## 2020-06-12 LAB — CBC WITH DIFFERENTIAL/PLATELET
Abs Immature Granulocytes: 0.67 10*3/uL — ABNORMAL HIGH (ref 0.00–0.07)
Basophils Absolute: 0.2 10*3/uL — ABNORMAL HIGH (ref 0.0–0.1)
Basophils Relative: 2 %
Eosinophils Absolute: 0.4 10*3/uL (ref 0.0–0.5)
Eosinophils Relative: 4 %
HCT: 31.8 % — ABNORMAL LOW (ref 36.0–46.0)
Hemoglobin: 10 g/dL — ABNORMAL LOW (ref 12.0–15.0)
Immature Granulocytes: 6 %
Lymphocytes Relative: 16 %
Lymphs Abs: 1.8 10*3/uL (ref 0.7–4.0)
MCH: 28.2 pg (ref 26.0–34.0)
MCHC: 31.4 g/dL (ref 30.0–36.0)
MCV: 89.6 fL (ref 80.0–100.0)
Monocytes Absolute: 1.1 10*3/uL — ABNORMAL HIGH (ref 0.1–1.0)
Monocytes Relative: 9 %
Neutro Abs: 7.1 10*3/uL (ref 1.7–7.7)
Neutrophils Relative %: 63 %
Platelets: 595 10*3/uL — ABNORMAL HIGH (ref 150–400)
RBC: 3.55 MIL/uL — ABNORMAL LOW (ref 3.87–5.11)
RDW: 23.4 % — ABNORMAL HIGH (ref 11.5–15.5)
Smear Review: NORMAL
WBC: 11.3 10*3/uL — ABNORMAL HIGH (ref 4.0–10.5)
nRBC: 0.2 % (ref 0.0–0.2)

## 2020-06-12 LAB — COMPREHENSIVE METABOLIC PANEL
ALT: 14 U/L (ref 0–44)
AST: 24 U/L (ref 15–41)
Albumin: 4.1 g/dL (ref 3.5–5.0)
Alkaline Phosphatase: 84 U/L (ref 38–126)
Anion gap: 12 (ref 5–15)
BUN: 36 mg/dL — ABNORMAL HIGH (ref 8–23)
CO2: 24 mmol/L (ref 22–32)
Calcium: 9.4 mg/dL (ref 8.9–10.3)
Chloride: 100 mmol/L (ref 98–111)
Creatinine, Ser: 1.38 mg/dL — ABNORMAL HIGH (ref 0.44–1.00)
GFR, Estimated: 36 mL/min — ABNORMAL LOW (ref >60)
Glucose, Bld: 115 mg/dL — ABNORMAL HIGH (ref 70–99)
Potassium: 4.6 mmol/L (ref 3.5–5.1)
Sodium: 136 mmol/L (ref 135–145)
Total Bilirubin: 0.5 mg/dL (ref 0.3–1.2)
Total Protein: 7 g/dL (ref 6.5–8.1)

## 2020-06-12 LAB — IRON AND TIBC
Iron: 119 ug/dL (ref 28–170)
Saturation Ratios: 46 % — ABNORMAL HIGH (ref 10.4–31.8)
TIBC: 259 ug/dL (ref 250–450)
UIBC: 140 ug/dL

## 2020-06-12 LAB — FERRITIN: Ferritin: 640 ng/mL — ABNORMAL HIGH (ref 11–307)

## 2020-06-12 MED ORDER — SODIUM CHLORIDE 0.9 % IV SOLN
Freq: Once | INTRAVENOUS | Status: AC
  Filled 2020-06-12: qty 250

## 2020-06-12 NOTE — Patient Instructions (Signed)
  Continue Jakafi 10 mg twice a day.

## 2020-06-12 NOTE — Progress Notes (Signed)
Pt received prescribed treatment in clinic, pt stable at d/c. 

## 2020-06-14 ENCOUNTER — Inpatient Hospital Stay: Payer: Medicare Other

## 2020-06-14 ENCOUNTER — Other Ambulatory Visit: Payer: Self-pay

## 2020-06-14 VITALS — BP 121/81 | HR 80 | Temp 97.0°F | Resp 20

## 2020-06-14 DIAGNOSIS — C44329 Squamous cell carcinoma of skin of other parts of face: Secondary | ICD-10-CM | POA: Diagnosis not present

## 2020-06-14 DIAGNOSIS — E860 Dehydration: Secondary | ICD-10-CM | POA: Diagnosis not present

## 2020-06-14 DIAGNOSIS — G3184 Mild cognitive impairment, so stated: Secondary | ICD-10-CM | POA: Diagnosis not present

## 2020-06-14 DIAGNOSIS — N1832 Chronic kidney disease, stage 3b: Secondary | ICD-10-CM | POA: Diagnosis not present

## 2020-06-14 DIAGNOSIS — D631 Anemia in chronic kidney disease: Secondary | ICD-10-CM | POA: Diagnosis not present

## 2020-06-14 DIAGNOSIS — D45 Polycythemia vera: Secondary | ICD-10-CM | POA: Diagnosis not present

## 2020-06-14 MED ORDER — SODIUM CHLORIDE 0.9 % IV SOLN
Freq: Once | INTRAVENOUS | Status: AC
  Filled 2020-06-14: qty 250

## 2020-06-19 ENCOUNTER — Other Ambulatory Visit: Payer: Self-pay

## 2020-06-19 ENCOUNTER — Inpatient Hospital Stay: Payer: Medicare Other

## 2020-06-19 DIAGNOSIS — G3184 Mild cognitive impairment, so stated: Secondary | ICD-10-CM | POA: Diagnosis not present

## 2020-06-19 DIAGNOSIS — D631 Anemia in chronic kidney disease: Secondary | ICD-10-CM | POA: Diagnosis not present

## 2020-06-19 DIAGNOSIS — E860 Dehydration: Secondary | ICD-10-CM

## 2020-06-19 DIAGNOSIS — N1832 Chronic kidney disease, stage 3b: Secondary | ICD-10-CM | POA: Diagnosis not present

## 2020-06-19 DIAGNOSIS — C44329 Squamous cell carcinoma of skin of other parts of face: Secondary | ICD-10-CM | POA: Diagnosis not present

## 2020-06-19 DIAGNOSIS — D45 Polycythemia vera: Secondary | ICD-10-CM | POA: Diagnosis not present

## 2020-06-19 MED ORDER — SODIUM CHLORIDE 0.9 % IV SOLN
Freq: Once | INTRAVENOUS | Status: AC
  Filled 2020-06-19: qty 250

## 2020-06-19 NOTE — Progress Notes (Signed)
Pt received prescribed treatment in clinic, pt stable at d/c. 

## 2020-06-21 ENCOUNTER — Other Ambulatory Visit: Payer: Self-pay

## 2020-06-21 ENCOUNTER — Inpatient Hospital Stay: Payer: Medicare Other

## 2020-06-21 VITALS — BP 100/83 | HR 88 | Temp 97.0°F | Resp 20

## 2020-06-21 DIAGNOSIS — D631 Anemia in chronic kidney disease: Secondary | ICD-10-CM | POA: Diagnosis not present

## 2020-06-21 DIAGNOSIS — E860 Dehydration: Secondary | ICD-10-CM | POA: Diagnosis not present

## 2020-06-21 DIAGNOSIS — N1832 Chronic kidney disease, stage 3b: Secondary | ICD-10-CM | POA: Diagnosis not present

## 2020-06-21 DIAGNOSIS — C44329 Squamous cell carcinoma of skin of other parts of face: Secondary | ICD-10-CM | POA: Diagnosis not present

## 2020-06-21 DIAGNOSIS — G3184 Mild cognitive impairment, so stated: Secondary | ICD-10-CM | POA: Diagnosis not present

## 2020-06-21 DIAGNOSIS — D45 Polycythemia vera: Secondary | ICD-10-CM | POA: Diagnosis not present

## 2020-06-21 MED ORDER — SODIUM CHLORIDE 0.9 % IV SOLN
Freq: Once | INTRAVENOUS | Status: AC
  Filled 2020-06-21: qty 250

## 2020-06-26 ENCOUNTER — Other Ambulatory Visit: Payer: Self-pay

## 2020-06-26 ENCOUNTER — Inpatient Hospital Stay: Payer: Medicare Other

## 2020-06-26 VITALS — BP 106/68 | HR 87

## 2020-06-26 DIAGNOSIS — D631 Anemia in chronic kidney disease: Secondary | ICD-10-CM | POA: Diagnosis not present

## 2020-06-26 DIAGNOSIS — D45 Polycythemia vera: Secondary | ICD-10-CM

## 2020-06-26 DIAGNOSIS — E860 Dehydration: Secondary | ICD-10-CM | POA: Diagnosis not present

## 2020-06-26 DIAGNOSIS — C44329 Squamous cell carcinoma of skin of other parts of face: Secondary | ICD-10-CM | POA: Diagnosis not present

## 2020-06-26 DIAGNOSIS — N1832 Chronic kidney disease, stage 3b: Secondary | ICD-10-CM | POA: Diagnosis not present

## 2020-06-26 DIAGNOSIS — G3184 Mild cognitive impairment, so stated: Secondary | ICD-10-CM | POA: Diagnosis not present

## 2020-06-26 LAB — CBC WITH DIFFERENTIAL/PLATELET
Abs Immature Granulocytes: 0.51 10*3/uL — ABNORMAL HIGH (ref 0.00–0.07)
Basophils Absolute: 0.2 10*3/uL — ABNORMAL HIGH (ref 0.0–0.1)
Basophils Relative: 1 %
Eosinophils Absolute: 0.4 10*3/uL (ref 0.0–0.5)
Eosinophils Relative: 3 %
HCT: 30.2 % — ABNORMAL LOW (ref 36.0–46.0)
Hemoglobin: 9.4 g/dL — ABNORMAL LOW (ref 12.0–15.0)
Immature Granulocytes: 5 %
Lymphocytes Relative: 15 %
Lymphs Abs: 1.7 10*3/uL (ref 0.7–4.0)
MCH: 28.2 pg (ref 26.0–34.0)
MCHC: 31.1 g/dL (ref 30.0–36.0)
MCV: 90.7 fL (ref 80.0–100.0)
Monocytes Absolute: 1 10*3/uL (ref 0.1–1.0)
Monocytes Relative: 9 %
Neutro Abs: 7.3 10*3/uL (ref 1.7–7.7)
Neutrophils Relative %: 67 %
Platelets: 578 10*3/uL — ABNORMAL HIGH (ref 150–400)
RBC: 3.33 MIL/uL — ABNORMAL LOW (ref 3.87–5.11)
RDW: 23.8 % — ABNORMAL HIGH (ref 11.5–15.5)
WBC: 11 10*3/uL — ABNORMAL HIGH (ref 4.0–10.5)
nRBC: 0.3 % — ABNORMAL HIGH (ref 0.0–0.2)

## 2020-06-26 MED ORDER — SODIUM CHLORIDE 0.9 % IV SOLN
Freq: Once | INTRAVENOUS | Status: AC
  Filled 2020-06-26: qty 250

## 2020-06-26 MED ORDER — EPOETIN ALFA-EPBX 10000 UNIT/ML IJ SOLN
10000.0000 [IU] | Freq: Once | INTRAMUSCULAR | Status: AC
  Administered 2020-06-26: 10000 [IU] via SUBCUTANEOUS
  Filled 2020-06-26: qty 1

## 2020-06-28 ENCOUNTER — Other Ambulatory Visit: Payer: Self-pay

## 2020-06-28 ENCOUNTER — Inpatient Hospital Stay: Payer: Medicare Other

## 2020-06-28 VITALS — BP 111/69 | HR 85 | Temp 97.9°F | Resp 18

## 2020-06-28 DIAGNOSIS — N1832 Chronic kidney disease, stage 3b: Secondary | ICD-10-CM | POA: Diagnosis not present

## 2020-06-28 DIAGNOSIS — D45 Polycythemia vera: Secondary | ICD-10-CM | POA: Diagnosis not present

## 2020-06-28 DIAGNOSIS — D631 Anemia in chronic kidney disease: Secondary | ICD-10-CM | POA: Diagnosis not present

## 2020-06-28 DIAGNOSIS — G3184 Mild cognitive impairment, so stated: Secondary | ICD-10-CM | POA: Diagnosis not present

## 2020-06-28 DIAGNOSIS — E860 Dehydration: Secondary | ICD-10-CM | POA: Diagnosis not present

## 2020-06-28 DIAGNOSIS — C44329 Squamous cell carcinoma of skin of other parts of face: Secondary | ICD-10-CM | POA: Diagnosis not present

## 2020-06-28 MED ORDER — SODIUM CHLORIDE 0.9 % IV SOLN
INTRAVENOUS | Status: DC
  Filled 2020-06-28 (×2): qty 250

## 2020-06-30 ENCOUNTER — Other Ambulatory Visit (HOSPITAL_COMMUNITY): Payer: Self-pay

## 2020-07-03 ENCOUNTER — Other Ambulatory Visit: Payer: Self-pay

## 2020-07-03 ENCOUNTER — Inpatient Hospital Stay: Payer: Medicare Other | Attending: Hematology and Oncology

## 2020-07-03 VITALS — BP 130/59 | HR 79 | Resp 18

## 2020-07-03 DIAGNOSIS — F0390 Unspecified dementia without behavioral disturbance: Secondary | ICD-10-CM | POA: Insufficient documentation

## 2020-07-03 DIAGNOSIS — D631 Anemia in chronic kidney disease: Secondary | ICD-10-CM | POA: Diagnosis not present

## 2020-07-03 DIAGNOSIS — N1832 Chronic kidney disease, stage 3b: Secondary | ICD-10-CM | POA: Insufficient documentation

## 2020-07-03 DIAGNOSIS — E860 Dehydration: Secondary | ICD-10-CM

## 2020-07-03 DIAGNOSIS — Z862 Personal history of diseases of the blood and blood-forming organs and certain disorders involving the immune mechanism: Secondary | ICD-10-CM | POA: Diagnosis not present

## 2020-07-03 DIAGNOSIS — D45 Polycythemia vera: Secondary | ICD-10-CM | POA: Insufficient documentation

## 2020-07-03 DIAGNOSIS — D649 Anemia, unspecified: Secondary | ICD-10-CM | POA: Insufficient documentation

## 2020-07-03 DIAGNOSIS — N289 Disorder of kidney and ureter, unspecified: Secondary | ICD-10-CM | POA: Diagnosis not present

## 2020-07-03 DIAGNOSIS — Z85828 Personal history of other malignant neoplasm of skin: Secondary | ICD-10-CM | POA: Diagnosis not present

## 2020-07-03 DIAGNOSIS — Z79899 Other long term (current) drug therapy: Secondary | ICD-10-CM | POA: Insufficient documentation

## 2020-07-03 DIAGNOSIS — Z87891 Personal history of nicotine dependence: Secondary | ICD-10-CM | POA: Insufficient documentation

## 2020-07-03 MED ORDER — SODIUM CHLORIDE 0.9 % IV SOLN
Freq: Once | INTRAVENOUS | Status: AC
  Filled 2020-07-03: qty 250

## 2020-07-03 NOTE — Progress Notes (Signed)
Patient tolerated IV NS infusion well today, no concerns voiced. BP improved following infusion. Patient discharged with daughter. Stable.

## 2020-07-04 ENCOUNTER — Ambulatory Visit (INDEPENDENT_AMBULATORY_CARE_PROVIDER_SITE_OTHER): Payer: Medicare Other | Admitting: Internal Medicine

## 2020-07-04 ENCOUNTER — Encounter: Payer: Self-pay | Admitting: Internal Medicine

## 2020-07-04 VITALS — BP 170/90 | HR 93 | Temp 97.8°F | Ht 67.0 in | Wt 115.0 lb

## 2020-07-04 DIAGNOSIS — E213 Hyperparathyroidism, unspecified: Secondary | ICD-10-CM

## 2020-07-04 DIAGNOSIS — D7581 Myelofibrosis: Secondary | ICD-10-CM | POA: Diagnosis not present

## 2020-07-04 DIAGNOSIS — S39012A Strain of muscle, fascia and tendon of lower back, initial encounter: Secondary | ICD-10-CM | POA: Diagnosis not present

## 2020-07-04 DIAGNOSIS — D61818 Other pancytopenia: Secondary | ICD-10-CM

## 2020-07-04 LAB — POCT URINALYSIS DIPSTICK
Blood, UA: NEGATIVE
Glucose, UA: NEGATIVE
Ketones, UA: NEGATIVE
Leukocytes, UA: NEGATIVE
Nitrite, UA: NEGATIVE
Protein, UA: NEGATIVE
Spec Grav, UA: 1.02 (ref 1.010–1.025)
Urobilinogen, UA: 0.2 E.U./dL
pH, UA: 6 (ref 5.0–8.0)

## 2020-07-04 NOTE — Progress Notes (Signed)
Date:  07/04/2020   Name:  Laura Wilcox   DOB:  11/06/1926   MRN:  [id number]   Chief Complaint: Urinary Tract Infection (X3 weeks, Daughter [name] in room with pt, mid Back pain to lower back, pt described the pain as deep pain)  Back Pain This is a new problem. The current episode started 1 to 4 weeks ago. The problem occurs constantly. The problem has been resolved (started 3 weeks ago - could not get off the toilet without help) since onset. The pain is present in the lumbar spine. The pain does not radiate. Pertinent negatives include no abdominal pain, chest pain, dysuria, headaches or weakness.    Lab Results  Component Value Date   CREATININE 1.38 (H) 06/12/2020   BUN 36 (H) 06/12/2020   NA 136 06/12/2020   K 4.6 06/12/2020   CL 100 06/12/2020   CO2 24 06/12/2020   Lab Results  Component Value Date   CHOL 279 (H) 05/27/2018   HDL 72 05/27/2018   LDLCALC 182 (H) 05/27/2018   TRIG 125 05/27/2018   CHOLHDL 3.9 05/27/2018   Lab Results  Component Value Date   TSH 2.085 12/20/2019   No results found for: HGBA1C Lab Results  Component Value Date   WBC 11.0 (H) 06/26/2020   HGB 9.4 (L) 06/26/2020   HCT 30.2 (L) 06/26/2020   MCV 90.7 06/26/2020   PLT 578 (H) 06/26/2020   Lab Results  Component Value Date   ALT 14 06/12/2020   AST 24 06/12/2020   ALKPHOS 84 06/12/2020   BILITOT 0.5 06/12/2020     Review of Systems  Constitutional: Negative for chills, fatigue and unexpected weight change.  HENT: Negative for nosebleeds.   Eyes: Negative for visual disturbance.  Respiratory: Negative for cough, chest tightness and shortness of breath.   Cardiovascular: Negative for chest pain, palpitations and leg swelling.  Gastrointestinal: Negative for abdominal pain, constipation and diarrhea.  Genitourinary: Negative for dysuria and hematuria.  Musculoskeletal: Positive for back pain.  Neurological: Negative for dizziness, weakness, light-headedness and headaches.     Patient Active Problem List   Diagnosis Date Noted  . Renal insufficiency 03/14/2020  . Dehydration 03/12/2020  . Myelofibrosis ([place]) 01/31/2020  . Other pancytopenia ([place]) 01/31/2020  . Squamous cell cancer of skin of left cheek 01/16/2020  . Chronic kidney disease, stage 3b ([place]) 03/11/2019  . Secondary hyperparathyroidism of renal origin ([place]) 03/11/2019  . Anemia in chronic kidney disease 03/11/2019  . Generalized weakness 12/17/2018  . Elevated ferritin 02/18/2018  . Goals of care, counseling/discussion 07/09/2017  . Polycythemia rubra vera ([place]) 06/02/2017  . Underweight 06/02/2017  . Advanced care planning/counseling discussion 06/02/2017  . Cognitive impairment 06/02/2017    No Known Allergies  Past Surgical History:  Procedure Laterality Date  . ABDOMINAL HYSTERECTOMY     complete  . BREAST SURGERY      Social History   Tobacco Use  . Smoking status: Former Smoker    Packs/day: 1.00    Years: 30.00    Pack years: 30.00    Quit date: 04/02/1978    Years since quitting: 42.2  . Smokeless tobacco: Never Used  Vaping Use  . Vaping Use: Never used  Substance Use Topics  . Alcohol use: No    Comment: occasional glass of wine  . Drug use: No     Medication list has been reviewed and updated.  Current Meds  Medication Sig  . acetaminophen (TYLENOL) 325 MG tablet  Take 650 mg by mouth every 6 (six) hours as needed.  . calcitRIOL (ROCALTROL) 0.25 MCG capsule Take 0.25 mcg by mouth 3 (three) times a week.   . ruxolitinib phosphate (JAKAFI) 10 MG tablet TAKE 1 TABLET (10 MG TOTAL) BY MOUTH 2 (TWO) TIMES DAILY.    PHQ 2/9 Scores 07/04/2020 03/28/2020 01/31/2020 06/02/2017  PHQ - 2 Score 0 0 0 0  PHQ- 9 Score 0 0 0 0    GAD 7 : Generalized Anxiety Score 07/04/2020 03/28/2020 01/31/2020  Nervous, Anxious, on Edge 0 0 0  Control/stop worrying 0 0 0  Worry too much - different things 0 0 0  Trouble relaxing 0 0 0  Restless 0 0 0  Easily annoyed or irritable 0 0 0   Afraid - awful might happen 0 0 0  Total GAD 7 Score 0 0 0  Anxiety Difficulty - Not difficult at all Not difficult at all    BP Readings from Last 3 Encounters:  07/04/20 (!) 170/90  07/03/20 (!) 130/59  06/28/20 111/69    Physical Exam Cardiovascular:     Rate and Rhythm: Normal rate and regular rhythm.     Pulses: Normal pulses.     Heart sounds: No murmur heard.   Pulmonary:     Effort: Pulmonary effort is normal.     Breath sounds: Normal breath sounds.  Abdominal:     General: Abdomen is flat.     Palpations: Abdomen is soft.     Tenderness: There is no right CVA tenderness or left CVA tenderness.  Musculoskeletal:     Cervical back: Normal range of motion.     Right lower leg: No edema.     Left lower leg: No edema.  Lymphadenopathy:     Cervical: No cervical adenopathy.  Skin:    General: Skin is warm and dry.  Neurological:     Mental Status: She is alert.     Wt Readings from Last 3 Encounters:  07/04/20 115 lb (52.2 kg)  06/12/20 110 lb 3.7 oz (50 kg)  05/17/20 114 lb 3.2 oz (51.8 kg)    BP (!) 170/90   Pulse 93   Temp 97.8 F (36.6 C) (Oral)   Ht 5\' 7"  (1.702 m)   Wt 115 lb (52.2 kg)   SpO2 100%   BMI 18.01 kg/m   Assessment and Plan: 1. Back strain, initial encounter Appears to be resolved; continue Tylenol as needed and activity as tolerated If recurrent, return for imaging  2. Myelofibrosis [facility]) Being followed by Oncology  3. Other pancytopenia ([place]) Stable   4. Hyperparathyroidism, unspecified ([place]) Secondary to CKD followed by Nephrology She continues on Rocaltrol   Partially dictated using Editor, commissioning. Any errors are unintentional.  Halina Maidens, MD [facility]  07/04/2020

## 2020-07-05 ENCOUNTER — Inpatient Hospital Stay: Payer: Medicare Other

## 2020-07-05 ENCOUNTER — Other Ambulatory Visit: Payer: Self-pay

## 2020-07-05 ENCOUNTER — Other Ambulatory Visit (HOSPITAL_COMMUNITY): Payer: Self-pay

## 2020-07-05 VITALS — BP 164/67 | HR 89 | Temp 97.0°F | Resp 18

## 2020-07-05 DIAGNOSIS — D631 Anemia in chronic kidney disease: Secondary | ICD-10-CM | POA: Diagnosis not present

## 2020-07-05 DIAGNOSIS — N1832 Chronic kidney disease, stage 3b: Secondary | ICD-10-CM | POA: Diagnosis not present

## 2020-07-05 DIAGNOSIS — D45 Polycythemia vera: Secondary | ICD-10-CM | POA: Diagnosis not present

## 2020-07-05 DIAGNOSIS — E860 Dehydration: Secondary | ICD-10-CM

## 2020-07-05 MED ORDER — SODIUM CHLORIDE 0.9 % IV SOLN
Freq: Once | INTRAVENOUS | Status: AC
  Filled 2020-07-05: qty 250

## 2020-07-06 NOTE — Progress Notes (Signed)
[facility]  [address] [place], [place] [address] Phone: [phone]  Fax: [phone]   Clinic Day:  07/10/2020   Referring physician: Glean Hess, MD  Chief Complaint: Laura Wilcox is a 87 y.o. female with polycythemia rubra vera (PV)on [name] who is seen for 1 month assessment.   HPI: The patient was last seen in the hematology clinic on 06/12/2020. At that time, she struggled to drink 5 glasses of water a day. She had paraspinal muscle tenderness. Hematocrit was 31.8, hemoglobin 10.0, MCV 89.6, platelets 595,000, WBC 11,300. BUN was 36 and creatinine was 1.38 (CrCl 36 ml/min). Ferritin was 640 with an iron saturation of 46% and a TIBC of 259. She received IV fluids.  Labs on 06/26/2020 revealed a hematocrit of 30.2, hemoglobin 9.4, MCV 90.7, platelets 578,000, WBC 11,000.  The patient has been receiving IV fluids on Mondays and Wednesdays. She received Retacrit 10,000 units on 06/26/2020.  During the interim, she has been "ok." She has back pain, which started about a month ago. She uses an elevated toilet seat, which has helped. She has not been able to go shopping recently because of her back pain. The area on her left cheek is healing well.  Her appetite is good. She eats well and drinks water when her daughter is around. She has missed an average of 2 Jakafi pills per week.   Past Medical History:  Diagnosis Date  . Confusion 12/30/2018  . Polycythemia   . Urinary tract infection without hematuria 01/02/2020  . Urticaria 06/02/2017    Past Surgical History:  Procedure Laterality Date  . ABDOMINAL HYSTERECTOMY     complete  . BREAST SURGERY      Family History  Problem Relation Age of Onset  . Heart disease Father   . Cancer Sister        breast  . Cancer Sister        breast  . Alzheimer's disease Sister     Social History:  reports that she quit smoking about 42 years ago. She has a 30.00 pack-year smoking  history. She has never used smokeless tobacco. She reports that she does not drink alcohol and does not use drugs. She is widowed. Patient is retired Sport and exercise psychologist. Patient moved to [facility] in 03/2017. She lives independently in her own apartment in [place]. Her daughter lives next door.Patient denies known exposures to radiation on toxins.Pt's daughter, [name] be reached at [phone]. She lives in [place].  The patient is accompanied by her daughter today.   Allergies: No Known Allergies  Current Medications: Current Outpatient Medications  Medication Sig Dispense Refill  . acetaminophen (TYLENOL) 325 MG tablet Take 650 mg by mouth every 6 (six) hours as needed.    . calcitRIOL (ROCALTROL) 0.25 MCG capsule Take 0.25 mcg by mouth 3 (three) times a week.     . ruxolitinib phosphate (JAKAFI) 10 MG tablet TAKE 1 TABLET (10 MG TOTAL) BY MOUTH 2 (TWO) TIMES DAILY. 60 tablet 1   No current facility-administered medications for this visit.    Review of Systems  Constitutional: Positive for weight loss (2 lbs). Negative for chills, diaphoresis, fever and malaise/fatigue.       "I think I'm ok".  HENT: Positive for hearing loss. Negative for congestion, ear discharge, ear pain, nosebleeds, sinus pain, sore throat and tinnitus.   Eyes: Negative.  Negative for blurred vision.  Respiratory: Negative.  Negative for cough, hemoptysis, sputum production and shortness of breath.  Cardiovascular: Negative.  Negative for chest pain, palpitations and leg swelling.  Gastrointestinal: Negative.  Negative for abdominal pain, blood in stool, constipation, diarrhea, heartburn, melena, nausea and vomiting.  Genitourinary: Negative.  Negative for dysuria, frequency, hematuria and urgency.  Musculoskeletal: Positive for back pain (x 1 month). Negative for joint pain, myalgias and neck pain.  Skin: Negative.  Negative for itching and rash.  Neurological: Negative.  Negative for dizziness, tingling, sensory change,  weakness and headaches.  Endo/Heme/Allergies: Negative.  Does not bruise/bleed easily.  Psychiatric/Behavioral: Positive for memory loss. Negative for depression. The patient is not nervous/anxious and does not have insomnia.   All other systems reviewed and are negative.  Performance status (ECOG):  2-3  Vitals Blood pressure 111/60, pulse 80, temperature (!) 97.5 F (36.4 C), resp. rate 16, weight 113 lb 10.4 oz (51.5 kg), SpO2 100 %.   Physical Exam Vitals and nursing note reviewed.  Constitutional:      General: She is not in acute distress.    Appearance: She is not diaphoretic.     Interventions: Face mask in place.  HENT:     Head: Normocephalic and atraumatic.     Comments: Short dark blonde hair.    Mouth/Throat:     Mouth: Mucous membranes are moist.     Pharynx: Oropharynx is clear.  Eyes:     General: No scleral icterus.    Extraocular Movements: Extraocular movements intact.     Conjunctiva/sclera: Conjunctivae normal.     Pupils: Pupils are equal, round, and reactive to light.     Comments: Blue eyes.  Cardiovascular:     Rate and Rhythm: Normal rate and regular rhythm.     Heart sounds: Normal heart sounds. No murmur heard.   Pulmonary:     Effort: Pulmonary effort is normal. No respiratory distress.     Breath sounds: Normal breath sounds. No wheezing or rales.  Chest:     Chest wall: No tenderness.  Breasts:     Right: No axillary adenopathy or supraclavicular adenopathy.     Left: No axillary adenopathy or supraclavicular adenopathy.    Abdominal:     General: Bowel sounds are normal. There is no distension.     Palpations: Abdomen is soft. There is no hepatomegaly, splenomegaly or mass.     Tenderness: There is no abdominal tenderness. There is no guarding or rebound.  Musculoskeletal:        General: No swelling or tenderness. Normal range of motion.     Cervical back: Normal range of motion and neck supple.  Lymphadenopathy:     Head:      Right side of head: No preauricular, posterior auricular or occipital adenopathy.     Left side of head: No preauricular, posterior auricular or occipital adenopathy.     Cervical: No cervical adenopathy.     Upper Body:     Right upper body: No supraclavicular or axillary adenopathy.     Left upper body: No supraclavicular or axillary adenopathy.     Lower Body: No right inguinal adenopathy. No left inguinal adenopathy.  Skin:    General: Skin is warm and dry.     Comments: Left cheek bandage.  Neurological:     Mental Status: She is alert. Mental status is at baseline.  Psychiatric:        Behavior: Behavior normal.     Comments: Memory is poor.      01/04/2020    Appointment on 07/10/2020  Component Date  Value Ref Range Status  . Sodium 07/10/2020 134* 135 - 145 mmol/L Final  . Potassium 07/10/2020 4.2  3.5 - 5.1 mmol/L Final  . Chloride 07/10/2020 100  98 - 111 mmol/L Final  . CO2 07/10/2020 26  22 - 32 mmol/L Final  . Glucose, Bld 07/10/2020 135* 70 - 99 mg/dL Final   Glucose reference range applies only to samples taken after fasting for at least 8 hours.  . BUN 07/10/2020 25* 8 - 23 mg/dL Final  . Creatinine, Ser 07/10/2020 1.31* 0.44 - 1.00 mg/dL Final  . Calcium 07/10/2020 9.4  8.9 - 10.3 mg/dL Final  . Total Protein 07/10/2020 7.2  6.5 - 8.1 g/dL Final  . Albumin 07/10/2020 4.3  3.5 - 5.0 g/dL Final  . AST 07/10/2020 21  15 - 41 U/L Final  . ALT 07/10/2020 13  0 - 44 U/L Final  . Alkaline Phosphatase 07/10/2020 115  38 - 126 U/L Final  . Total Bilirubin 07/10/2020 0.3  0.3 - 1.2 mg/dL Final  . GFR, Estimated 07/10/2020 38* >60 mL/min Final   Comment: (NOTE) Calculated using the CKD-EPI Creatinine Equation (2021)   . Anion gap 07/10/2020 8  5 - 15 Final   Performed at [facility], [address], [place], [place] [address]  . WBC 07/10/2020 11.2* 4.0 - 10.5 K/uL Final  . RBC 07/10/2020 3.42* 3.87 - 5.11 MIL/uL Final  . Hemoglobin 07/10/2020 9.8*  12.0 - 15.0 g/dL Final  . HCT 07/10/2020 31.2* 36.0 - 46.0 % Final  . MCV 07/10/2020 91.2  80.0 - 100.0 fL Final  . MCH 07/10/2020 28.7  26.0 - 34.0 pg Final  . MCHC 07/10/2020 31.4  30.0 - 36.0 g/dL Final  . RDW 07/10/2020 23.9* 11.5 - 15.5 % Final  . Platelets 07/10/2020 646* 150 - 400 K/uL Final  . nRBC 07/10/2020 0.4* 0.0 - 0.2 % Final  . Neutrophils Relative % 07/10/2020 67  % Final  . Neutro Abs 07/10/2020 7.5  1.7 - 7.7 K/uL Final  . Lymphocytes Relative 07/10/2020 14  % Final  . Lymphs Abs 07/10/2020 1.6  0.7 - 4.0 K/uL Final  . Monocytes Relative 07/10/2020 9  % Final  . Monocytes Absolute 07/10/2020 1.0  0.1 - 1.0 K/uL Final  . Eosinophils Relative 07/10/2020 3  % Final  . Eosinophils Absolute 07/10/2020 0.3  0.0 - 0.5 K/uL Final  . Basophils Relative 07/10/2020 2  % Final  . Basophils Absolute 07/10/2020 0.2* 0.0 - 0.1 K/uL Final  . Immature Granulocytes 07/10/2020 5  % Final  . Abs Immature Granulocytes 07/10/2020 0.59* 0.00 - 0.07 K/uL Final   Performed at [facility], [address], [place], [place] [address]    Assessment:  Laura Wilcox is a 87 y.o. female with polycythemia rubra veraand secondary myelofibrosis. She presented in 04/2002 with thrombocytosis (835,000). JAK2 V617Fwas positive on 05/20/2012.  Over the past year,hematocrithas ranged 37.5 - 42.4,hemoglobin12.1 - 13.4, platelets458,000 - 569,000, WBC10,500 - 15,700. Creatinine has been 1.0 - 1.2.  Bone marrowon 04/15/2002 revealed a normocellular marrow for age with trilineage hematopoiesis and mild megakaryocytic hyperplasia with focal atypia. There was no morphologic evidence of a myeloproliferative disorder. Iron stains were inadequate for evaluation of storage iron. There was no increase in reticulin fibers. She has been on agrylin since 2012.  Bone marrowon 09/25/2012 revealed a persistent myeloproliferative disorder s/p Agrylin. The current features were best  classified as persistent myeloproliferative neoplasm (MPN) JAK2 V617F mutation positive.  Marrow was hypercellular for age (30-50%) with increased trilineage hematopoiesis and markedly increased atypical megakaryopoiesis with frequent clustering. There were no increased blasts. Iron stores were absent. There was moderate to severe myelofibrosis(grade MF 2-3). Differential included ET, PV, primary myelofibrosis, and MPN, unclassifiable. Cytogeneticswere normal (68, XX).  She developed pancytopeniain 12/2015. Etiology is unclear (medication confusion or hydroxyurea + Agrylin).  She received Procrit40,000 units (intermittently 09/2012 - 06/2016), Venofer(09/2012 - 10/2012), and Infed(12/2015 - 03/2016). She received Neupogenduring a period of neutropenia. She was anagrelidefrom 2004 - 08/13/2017. She beganJakafion 08/14/2017. Jakafi was increased from 5 mg BID to 10 mg BID on03/18/2020.Jakafi was increased to 15 mg BID on 08/03/2018.She has been back on Jakafi 5 mg BID since 02/01/2019 then 10 mg BID on 05/25/2019.  [name] was on hold from 12/20/2019 - 01/12/2020.  Jakafi 5 mg twice daily restarted on 01/12/2020 then increased to 10 mg in the morning and 5 mg in the evening on 02/02/2020. [name] is currently 20 mg po BID.  She has received Retacrit to maintain a hemoglobin > 10 (12/24/2019 and 06/26/2020).  She began a phlebotomy programon 08/06/2017. She undergoes small volume (150 cc with replacement of 150 cc NS)if her HCT >42.  Ferritinhas been followed: 529 on 09/03/2017, 1020 on 12/03/2017, 423 on 02/18/2018, 858 on 11/17/2018, 785 on 09/23/2019 and 728 on 12/20/2019.Iron saturationwas 37%and TIBC 270 on08/18/2020.B12 was 477 and folate 36 on 11/17/2018.  B12 was 292 and folate 19.1 on 12/20/2019  She was admitted to [facility] 12/17/2018 - 12/18/2018. She received 1 unit of PRBCs.CBC at discharge included a hematocrit of 29.0, hemoglobin 9.4, platelets 364,000,  and WBC 10,200.  She received a course of Keflexfor a Proteus mirabilisUTIbeginning 01/11/2019.  Urine culture on 12/27/2019 revealed greater than 100,000 colonies of Enterobacter cloacae.  She was treated with Septra secondary to sensitivities.  The patient has received both doses of the COVID-19 vaccine.  Symptomatically, she continues to struggle with fluid intake.  She misses some of her Jakafi.  He daughter works with her daily.  Exam is stable.  Plan: 1.   Labs today: CBC with diff, CMP 2.Polycythemia rubra vera  Hematocrit 30.2.  Hemoglobin 9.4.  Platelets 578,000 on 06/26/2020.  Hematocrit 31.2.  Hemoglobin 9.8.  Platelets 646,000 on 07/10/2020. Platelet goal is < 400,000. She is on Jakafi 20 mg p.o twice daily.  She misses a few doses a week. She has a history of anemia with hydroxyurea as well as Jakafi when platelet count is controlled.  She has done well with minimal Retacrit support to balance anemia and thrombocytosis. Continue to monitor hemoglobin closely with increased Jakafi. 3.Renal insufficiency BUN 25 and creatinine 1.31 today. She continues to require daily encouragement to drink her fluid goal of 5 glasses a day. Patient encouraged to maintain fluid chart (glasses of water). No IV fluids needed today. 4.Anemia             Hematocrit 31.2.  Hemoglobin 9.8. MCV 91.2.                Ferritin 640 with an iron saturation of 46% and a TIBC 259.   Iron saturation has fluctuated.  Iron stores were absent in bone marrow in 2014.             B12 was 292 (low normal) with a folate of 19.1 on 12/20/2019.              B12 goal is 400.  She received Retacrit on 06/26/2020.  Anticipate Retacrit monthly with current dose of Jakafi to maintain hemoglobin > 10. 5.   Dementia             She has severe underlying dementia.             She has declined neurology consultation on multiple occasions.  She has an appointment with Dr. Manuella Ghazi on 07/31/2020. 6.    Squamous cell carcinoma left cheek  She is s/p biopsy in 10/2017 by Dr Phillip Heal.  She was seen by Dr. Lacinda Axon at [facility] on 04/11/2020.    She was noted to have a 3.5-4 cm keratotic plaque with abundant scale c/w squamous cell carcinoma in situ.   Treatment options were discussed (Moh's, radiation, topicals).  She underwent cryotherapy by Dr Nehemiah Massed on 05/25/2020.   She underwent debridement on 06/01/2020.   She has a follow-up appointment on 07/12/2020.  Continue to monitor. 7.   Recurrent dehydration  BUN 25.  Creatinine 1.31 today.  Hydration status adequate today.  Continue to encourage water chart.  Patient's daughter would like to continue fluids twice weekly in clinic.  Patient's daughter to contact clinic if any interval concerns for dehydration. 8.   Retacrit today. 9.   RTC on Wednesday for IVF (late appt). 10.   RTC on Mondays and Wednesdays weekly x 3 weeks for IVF. 11.   RTC in 1 month for MD assessment, labs (CBC with diff, CMP, B12) and +/- Retacrit.  I discussed the assessment and treatment plan with the patient.  The patient was provided an opportunity to ask questions and all were answered.  The patient agreed with the plan and demonstrated an understanding of the instructions.  The patient was advised to call back if the symptoms worsen or if the condition fails to improve as anticipated.  I provided 23 minutes of face-to-face time during this this encounter and > 50% was spent counseling as documented under my assessment and plan. An additional 5-6 minutes were spent reviewing her chart (Epic and Care Everywhere) including notes, labs, and imaging studies.    Lequita Asal, MD, PhD    07/10/2020, 2:54 PM   I, Mirian Mo Tufford, am acting as Education administrator for [employer]. Mike Gip, MD, PhD.  I, Harveen Flesch C. Mike Gip, MD, have reviewed the above documentation for accuracy and completeness, and I agree with the above.

## 2020-07-10 ENCOUNTER — Inpatient Hospital Stay: Payer: Medicare Other

## 2020-07-10 ENCOUNTER — Inpatient Hospital Stay (HOSPITAL_BASED_OUTPATIENT_CLINIC_OR_DEPARTMENT_OTHER): Payer: Medicare Other | Admitting: Hematology and Oncology

## 2020-07-10 ENCOUNTER — Other Ambulatory Visit (HOSPITAL_COMMUNITY): Payer: Self-pay

## 2020-07-10 ENCOUNTER — Encounter: Payer: Self-pay | Admitting: Hematology and Oncology

## 2020-07-10 ENCOUNTER — Other Ambulatory Visit: Payer: Self-pay

## 2020-07-10 VITALS — BP 111/60 | HR 80 | Temp 97.5°F | Resp 16 | Wt 113.6 lb

## 2020-07-10 DIAGNOSIS — N289 Disorder of kidney and ureter, unspecified: Secondary | ICD-10-CM | POA: Diagnosis not present

## 2020-07-10 DIAGNOSIS — N1832 Chronic kidney disease, stage 3b: Secondary | ICD-10-CM

## 2020-07-10 DIAGNOSIS — D631 Anemia in chronic kidney disease: Secondary | ICD-10-CM

## 2020-07-10 DIAGNOSIS — D45 Polycythemia vera: Secondary | ICD-10-CM

## 2020-07-10 DIAGNOSIS — C44329 Squamous cell carcinoma of skin of other parts of face: Secondary | ICD-10-CM | POA: Diagnosis not present

## 2020-07-10 LAB — COMPREHENSIVE METABOLIC PANEL
ALT: 13 U/L (ref 0–44)
AST: 21 U/L (ref 15–41)
Albumin: 4.3 g/dL (ref 3.5–5.0)
Alkaline Phosphatase: 115 U/L (ref 38–126)
Anion gap: 8 (ref 5–15)
BUN: 25 mg/dL — ABNORMAL HIGH (ref 8–23)
CO2: 26 mmol/L (ref 22–32)
Calcium: 9.4 mg/dL (ref 8.9–10.3)
Chloride: 100 mmol/L (ref 98–111)
Creatinine, Ser: 1.31 mg/dL — ABNORMAL HIGH (ref 0.44–1.00)
GFR, Estimated: 38 mL/min — ABNORMAL LOW (ref >60)
Glucose, Bld: 135 mg/dL — ABNORMAL HIGH (ref 70–99)
Potassium: 4.2 mmol/L (ref 3.5–5.1)
Sodium: 134 mmol/L — ABNORMAL LOW (ref 135–145)
Total Bilirubin: 0.3 mg/dL (ref 0.3–1.2)
Total Protein: 7.2 g/dL (ref 6.5–8.1)

## 2020-07-10 LAB — CBC WITH DIFFERENTIAL/PLATELET
Abs Immature Granulocytes: 0.59 10*3/uL — ABNORMAL HIGH (ref 0.00–0.07)
Basophils Absolute: 0.2 10*3/uL — ABNORMAL HIGH (ref 0.0–0.1)
Basophils Relative: 2 %
Eosinophils Absolute: 0.3 10*3/uL (ref 0.0–0.5)
Eosinophils Relative: 3 %
HCT: 31.2 % — ABNORMAL LOW (ref 36.0–46.0)
Hemoglobin: 9.8 g/dL — ABNORMAL LOW (ref 12.0–15.0)
Immature Granulocytes: 5 %
Lymphocytes Relative: 14 %
Lymphs Abs: 1.6 10*3/uL (ref 0.7–4.0)
MCH: 28.7 pg (ref 26.0–34.0)
MCHC: 31.4 g/dL (ref 30.0–36.0)
MCV: 91.2 fL (ref 80.0–100.0)
Monocytes Absolute: 1 10*3/uL (ref 0.1–1.0)
Monocytes Relative: 9 %
Neutro Abs: 7.5 10*3/uL (ref 1.7–7.7)
Neutrophils Relative %: 67 %
Platelets: 646 10*3/uL — ABNORMAL HIGH (ref 150–400)
RBC: 3.42 MIL/uL — ABNORMAL LOW (ref 3.87–5.11)
RDW: 23.9 % — ABNORMAL HIGH (ref 11.5–15.5)
WBC: 11.2 10*3/uL — ABNORMAL HIGH (ref 4.0–10.5)
nRBC: 0.4 % — ABNORMAL HIGH (ref 0.0–0.2)

## 2020-07-10 MED ORDER — EPOETIN ALFA-EPBX 10000 UNIT/ML IJ SOLN
10000.0000 [IU] | Freq: Once | INTRAMUSCULAR | Status: AC
  Administered 2020-07-10: 10000 [IU] via SUBCUTANEOUS

## 2020-07-10 NOTE — Progress Notes (Signed)
Patient here for lab results no complaints today.

## 2020-07-11 ENCOUNTER — Other Ambulatory Visit (HOSPITAL_COMMUNITY): Payer: Self-pay

## 2020-07-11 MED FILL — Ruxolitinib Phosphate Tab 10 MG (Base Equivalent): ORAL | 30 days supply | Qty: 60 | Fill #0 | Status: AC

## 2020-07-12 ENCOUNTER — Other Ambulatory Visit (HOSPITAL_COMMUNITY): Payer: Self-pay

## 2020-07-12 ENCOUNTER — Inpatient Hospital Stay: Payer: Medicare Other

## 2020-07-12 ENCOUNTER — Encounter: Payer: Self-pay | Admitting: Dermatology

## 2020-07-12 ENCOUNTER — Other Ambulatory Visit: Payer: Self-pay

## 2020-07-12 ENCOUNTER — Ambulatory Visit (INDEPENDENT_AMBULATORY_CARE_PROVIDER_SITE_OTHER): Payer: Medicare Other | Admitting: Dermatology

## 2020-07-12 DIAGNOSIS — D0461 Carcinoma in situ of skin of right upper limb, including shoulder: Secondary | ICD-10-CM

## 2020-07-12 DIAGNOSIS — L570 Actinic keratosis: Secondary | ICD-10-CM | POA: Diagnosis not present

## 2020-07-12 DIAGNOSIS — D631 Anemia in chronic kidney disease: Secondary | ICD-10-CM | POA: Diagnosis not present

## 2020-07-12 DIAGNOSIS — Z86007 Personal history of in-situ neoplasm of skin: Secondary | ICD-10-CM | POA: Diagnosis not present

## 2020-07-12 DIAGNOSIS — D485 Neoplasm of uncertain behavior of skin: Secondary | ICD-10-CM

## 2020-07-12 DIAGNOSIS — N1832 Chronic kidney disease, stage 3b: Secondary | ICD-10-CM | POA: Diagnosis not present

## 2020-07-12 DIAGNOSIS — L820 Inflamed seborrheic keratosis: Secondary | ICD-10-CM

## 2020-07-12 DIAGNOSIS — L578 Other skin changes due to chronic exposure to nonionizing radiation: Secondary | ICD-10-CM | POA: Diagnosis not present

## 2020-07-12 DIAGNOSIS — E860 Dehydration: Secondary | ICD-10-CM

## 2020-07-12 DIAGNOSIS — L821 Other seborrheic keratosis: Secondary | ICD-10-CM | POA: Diagnosis not present

## 2020-07-12 DIAGNOSIS — D45 Polycythemia vera: Secondary | ICD-10-CM | POA: Diagnosis not present

## 2020-07-12 MED ORDER — MUPIROCIN 2 % EX OINT: TOPICAL_OINTMENT | CUTANEOUS | 0 refills | Status: DC

## 2020-07-12 MED ORDER — SODIUM CHLORIDE 0.9 % IV SOLN
Freq: Once | INTRAVENOUS | Status: AC
  Filled 2020-07-12: qty 250

## 2020-07-12 NOTE — Progress Notes (Deleted)
Follow-Up Visit   Subjective  Laura Wilcox is a 87 y.o. female who presents for the following: recheck SCCIS (L med cheek with hx of cellulitis - S/P Doxycycline 100mg  BID x 5d, Mupirocin 2% ointment BID ) and lesions (B/L hands - crusted, irregular ).  The following portions of the chart were reviewed this encounter and updated as appropriate:   Tobacco  Allergies  Meds  Problems  Med Hx  Surg Hx  Fam Hx     Review of Systems:  No other skin or systemic complaints except as noted in HPI or Assessment and Plan.  Objective  Well appearing patient in no apparent distress; mood and affect are within normal limits.  A focused examination was performed including the face and hands. Relevant physical exam findings are noted in the Assessment and Plan.  Objective  L thumb x 1, R thumb x 1, R index finger, R middle finger x 4 (6): Erythematous thin papules/macules with gritty scale.   Objective  R med dorsum hand: 2.2 cm hyperkeratotic papule   Objective  L med cheek: Healing ulceration  Assessment & Plan  AK (actinic keratosis) (6) L thumb x 1, R thumb x 1, R index finger, R middle finger x 4 Hypertrophic -  Destruction of lesion - L thumb x 1, R thumb x 1, R index finger, R middle finger x 4 Complexity: simple   Destruction method: cryotherapy   Informed consent: discussed and consent obtained   Timeout:  patient name, date of birth, surgical site, and procedure verified Lesion destroyed using liquid nitrogen: Yes   Region frozen until ice ball extended beyond lesion: Yes   Outcome: patient tolerated procedure well with no complications   Post-procedure details: wound care instructions given    mupirocin ointment (BACTROBAN) 2 % - L thumb x 1, R thumb x 1, R index finger, R middle finger x 4  Neoplasm of uncertain behavior of skin - SCCis R med dorsum hand Destruction of lesion Complexity: simple   Destruction method: cryotherapy   Informed consent: discussed and  consent obtained   Timeout:  patient name, date of birth, surgical site, and procedure verified Lesion destroyed using liquid nitrogen: Yes   Region frozen until ice ball extended beyond lesion: Yes   Outcome: patient tolerated procedure well with no complications   Post-procedure details: wound care instructions given    Appears to be SCCIS - will tx today with LN2 and recheck at follow up appointment.   History of squamous cell carcinoma in situ (SCCIS) of skin L med cheek S/P treatment with LN2 No evidence of infection or recurrence today - Clear. Observe for recurrence. Call clinic for new or changing lesions.  Recommend regular skin exams, daily broad-spectrum spf 30+ sunscreen use, and photoprotection.    Actinic Damage - chronic, secondary to cumulative UV radiation exposure/sun exposure over time - diffuse scaly erythematous macules with underlying dyspigmentation - Recommend daily broad spectrum sunscreen SPF 30+ to sun-exposed areas, reapply every 2 hours as needed.  - Recommend staying in the shade or wearing long sleeves, sun glasses (UVA+UVB protection) and wide brim hats (4-inch brim around the entire circumference of the hat). - Call for new or changing lesions.  Return in about 6 weeks (around 08/23/2020).  Luther Redo, CMA, am acting as scribe for Sarina Ser, MD .  Documentation: I have reviewed the above documentation for accuracy and completeness, and I agree with the above.  Sarina Ser, MD

## 2020-07-12 NOTE — Patient Instructions (Signed)

## 2020-07-12 NOTE — Progress Notes (Addendum)
Follow-Up Visit   Subjective  Laura Wilcox is a 87 y.o. female who presents for the following: recheck SCCIS (L med cheek with hx of cellulitis - S/P Doxycycline 100mg  BID x 5d, Mupirocin 2% ointment BID ) and lesions (B/L hands - crusted, irregular ).  The following portions of the chart were reviewed this encounter and updated as appropriate:   Tobacco  Allergies  Meds  Problems  Med Hx  Surg Hx  Fam Hx      Review of Systems:  No other skin or systemic complaints except as noted in HPI or Assessment and Plan.  Objective  Well appearing patient in no apparent distress; mood and affect are within normal limits.  A focused examination was performed including the face and hands. Relevant physical exam findings are noted in the Assessment and Plan.  Objective  L thumb x 1, R thumb x 1, R index finger, R middle finger x 4 (7): Erythematous thin papules/macules with gritty scale.   Objective  R med dorsum hand: 2.2 cm hyperkeratotic papule   Objective  L med cheek: Healing ulceration  Objective  L zygoma x 1: Erythematous keratotic or waxy stuck-on papule or plaque.   Assessment & Plan  AK (actinic keratosis) (7) L thumb x 1, R thumb x 1, R index finger x 1, R middle finger x 4 Hypertrophic -  Destruction of lesion - L thumb x 1, R thumb x 1, R index finger, R middle finger x 4 Complexity: simple   Destruction method: cryotherapy   Informed consent: discussed and consent obtained   Timeout:  patient name, date of birth, surgical site, and procedure verified Lesion destroyed using liquid nitrogen: Yes   Region frozen until ice ball extended beyond lesion: Yes   Outcome: patient tolerated procedure well with no complications   Post-procedure details: wound care instructions given    mupirocin ointment (BACTROBAN) 2 % - L thumb x 1, R thumb x 1, R index finger, R middle finger x 4  Neoplasm of uncertain behavior of skin - SCCis - 2.2 cm R med dorsum  hand Destruction of lesion 2.2 cm Complexity: simple   Destruction method: cryotherapy   Informed consent: discussed and consent obtained   Timeout:  patient name, date of birth, surgical site, and procedure verified Lesion destroyed using liquid nitrogen: Yes   Region frozen until ice ball extended beyond lesion: Yes   Outcome: patient tolerated procedure well with no complications   Post-procedure details: wound care instructions given   SCCIS - will tx today with LN2 and recheck at follow up appointment.   History of squamous cell carcinoma in situ (SCCIS) of skin L med cheek No evidence of infection or recurrence today - Clear. Observe for recurrence. Call clinic for new or changing lesions.  Recommend regular skin exams, daily broad-spectrum spf 30+ sunscreen use, and photoprotection.    Inflamed seborrheic keratosis L zygoma x 1 Destruction of lesion - L zygoma x 1 Complexity: simple   Destruction method: cryotherapy   Informed consent: discussed and consent obtained   Timeout:  patient name, date of birth, surgical site, and procedure verified Lesion destroyed using liquid nitrogen: Yes   Region frozen until ice ball extended beyond lesion: Yes   Outcome: patient tolerated procedure well with no complications   Post-procedure details: wound care instructions given    Actinic Damage - chronic, secondary to cumulative UV radiation exposure/sun exposure over time - diffuse scaly erythematous macules with underlying dyspigmentation -  Recommend daily broad spectrum sunscreen SPF 30+ to sun-exposed areas, reapply every 2 hours as needed.  - Recommend staying in the shade or wearing long sleeves, sun glasses (UVA+UVB protection) and wide brim hats (4-inch brim around the entire circumference of the hat). - Call for new or changing lesions.  Seborrheic Keratoses - Stuck-on, waxy, tan-brown papules and/or plaques  - Benign-appearing - Discussed benign etiology and prognosis. -  Observe - Call for any changes  Return in about 6 weeks (around 08/23/2020).  Luther Redo, CMA, am acting as scribe for Sarina Ser, MD .  Documentation: I have reviewed the above documentation for accuracy and completeness, and I agree with the above.  Sarina Ser, MD

## 2020-07-14 ENCOUNTER — Other Ambulatory Visit (HOSPITAL_COMMUNITY): Payer: Self-pay

## 2020-07-19 ENCOUNTER — Inpatient Hospital Stay: Payer: Medicare Other

## 2020-07-19 ENCOUNTER — Other Ambulatory Visit: Payer: Self-pay

## 2020-07-19 ENCOUNTER — Other Ambulatory Visit: Payer: Self-pay | Admitting: Hematology and Oncology

## 2020-07-19 VITALS — BP 134/70 | HR 84 | Temp 97.5°F | Resp 18

## 2020-07-19 DIAGNOSIS — D45 Polycythemia vera: Secondary | ICD-10-CM | POA: Diagnosis not present

## 2020-07-19 DIAGNOSIS — D649 Anemia, unspecified: Secondary | ICD-10-CM

## 2020-07-19 DIAGNOSIS — N1832 Chronic kidney disease, stage 3b: Secondary | ICD-10-CM | POA: Diagnosis not present

## 2020-07-19 DIAGNOSIS — E860 Dehydration: Secondary | ICD-10-CM

## 2020-07-19 DIAGNOSIS — D631 Anemia in chronic kidney disease: Secondary | ICD-10-CM | POA: Diagnosis not present

## 2020-07-19 DIAGNOSIS — N289 Disorder of kidney and ureter, unspecified: Secondary | ICD-10-CM

## 2020-07-19 MED ORDER — SODIUM CHLORIDE 0.9 % IV SOLN
Freq: Once | INTRAVENOUS | Status: AC
  Filled 2020-07-19: qty 250

## 2020-07-21 ENCOUNTER — Inpatient Hospital Stay: Payer: Medicare Other

## 2020-07-21 VITALS — BP 110/63 | HR 75 | Temp 98.6°F | Resp 18

## 2020-07-21 DIAGNOSIS — E860 Dehydration: Secondary | ICD-10-CM

## 2020-07-21 DIAGNOSIS — D631 Anemia in chronic kidney disease: Secondary | ICD-10-CM | POA: Diagnosis not present

## 2020-07-21 DIAGNOSIS — D45 Polycythemia vera: Secondary | ICD-10-CM | POA: Diagnosis not present

## 2020-07-21 DIAGNOSIS — N1832 Chronic kidney disease, stage 3b: Secondary | ICD-10-CM | POA: Diagnosis not present

## 2020-07-21 MED ORDER — SODIUM CHLORIDE 0.9 % IV SOLN
Freq: Once | INTRAVENOUS | Status: AC
  Filled 2020-07-21: qty 250

## 2020-07-21 NOTE — Progress Notes (Signed)
Pt receiving 500 ml of NS over 2 hours for hydration and kidney function. Tolerated well. Discharged to home with her daughter. Pt ambulatory.

## 2020-07-24 ENCOUNTER — Inpatient Hospital Stay: Payer: Medicare Other

## 2020-07-24 ENCOUNTER — Other Ambulatory Visit: Payer: Self-pay

## 2020-07-24 VITALS — BP 124/68 | HR 76 | Temp 98.4°F | Resp 18

## 2020-07-24 DIAGNOSIS — D45 Polycythemia vera: Secondary | ICD-10-CM | POA: Diagnosis not present

## 2020-07-24 DIAGNOSIS — N1832 Chronic kidney disease, stage 3b: Secondary | ICD-10-CM | POA: Diagnosis not present

## 2020-07-24 DIAGNOSIS — D631 Anemia in chronic kidney disease: Secondary | ICD-10-CM | POA: Diagnosis not present

## 2020-07-24 DIAGNOSIS — E860 Dehydration: Secondary | ICD-10-CM

## 2020-07-24 MED ORDER — SODIUM CHLORIDE 0.9 % IV SOLN
Freq: Once | INTRAVENOUS | Status: AC
  Filled 2020-07-24: qty 250

## 2020-07-26 ENCOUNTER — Other Ambulatory Visit: Payer: Self-pay

## 2020-07-26 ENCOUNTER — Inpatient Hospital Stay: Payer: Medicare Other

## 2020-07-26 VITALS — BP 116/49 | HR 80 | Resp 20

## 2020-07-26 DIAGNOSIS — D45 Polycythemia vera: Secondary | ICD-10-CM | POA: Diagnosis not present

## 2020-07-26 DIAGNOSIS — E860 Dehydration: Secondary | ICD-10-CM

## 2020-07-26 DIAGNOSIS — D631 Anemia in chronic kidney disease: Secondary | ICD-10-CM | POA: Diagnosis not present

## 2020-07-26 DIAGNOSIS — N1832 Chronic kidney disease, stage 3b: Secondary | ICD-10-CM | POA: Diagnosis not present

## 2020-07-26 MED ORDER — SODIUM CHLORIDE 0.9 % IV SOLN
INTRAVENOUS | Status: AC
Start: 2020-07-26 — End: 2020-07-26
  Filled 2020-07-26: qty 250

## 2020-07-31 ENCOUNTER — Other Ambulatory Visit: Payer: Self-pay

## 2020-07-31 ENCOUNTER — Inpatient Hospital Stay: Payer: Medicare Other | Attending: Oncology

## 2020-07-31 DIAGNOSIS — Z87891 Personal history of nicotine dependence: Secondary | ICD-10-CM | POA: Diagnosis not present

## 2020-07-31 DIAGNOSIS — D7581 Myelofibrosis: Secondary | ICD-10-CM | POA: Diagnosis not present

## 2020-07-31 DIAGNOSIS — D75839 Thrombocytosis, unspecified: Secondary | ICD-10-CM | POA: Insufficient documentation

## 2020-07-31 DIAGNOSIS — Z79899 Other long term (current) drug therapy: Secondary | ICD-10-CM | POA: Insufficient documentation

## 2020-07-31 DIAGNOSIS — N289 Disorder of kidney and ureter, unspecified: Secondary | ICD-10-CM | POA: Diagnosis not present

## 2020-07-31 DIAGNOSIS — Z818 Family history of other mental and behavioral disorders: Secondary | ICD-10-CM | POA: Insufficient documentation

## 2020-07-31 DIAGNOSIS — D45 Polycythemia vera: Secondary | ICD-10-CM | POA: Insufficient documentation

## 2020-07-31 DIAGNOSIS — Z803 Family history of malignant neoplasm of breast: Secondary | ICD-10-CM | POA: Diagnosis not present

## 2020-07-31 DIAGNOSIS — F0390 Unspecified dementia without behavioral disturbance: Secondary | ICD-10-CM | POA: Insufficient documentation

## 2020-07-31 DIAGNOSIS — C44329 Squamous cell carcinoma of skin of other parts of face: Secondary | ICD-10-CM | POA: Insufficient documentation

## 2020-07-31 DIAGNOSIS — N1832 Chronic kidney disease, stage 3b: Secondary | ICD-10-CM | POA: Diagnosis present

## 2020-07-31 DIAGNOSIS — D631 Anemia in chronic kidney disease: Secondary | ICD-10-CM | POA: Diagnosis not present

## 2020-07-31 DIAGNOSIS — E860 Dehydration: Secondary | ICD-10-CM | POA: Diagnosis not present

## 2020-07-31 DIAGNOSIS — M549 Dorsalgia, unspecified: Secondary | ICD-10-CM | POA: Diagnosis not present

## 2020-07-31 DIAGNOSIS — Z8249 Family history of ischemic heart disease and other diseases of the circulatory system: Secondary | ICD-10-CM | POA: Diagnosis not present

## 2020-07-31 MED ORDER — SODIUM CHLORIDE 0.9 % IV SOLN
INTRAVENOUS | Status: DC
  Filled 2020-07-31 (×2): qty 250

## 2020-08-02 ENCOUNTER — Inpatient Hospital Stay: Payer: Medicare Other | Attending: Hematology and Oncology

## 2020-08-02 ENCOUNTER — Other Ambulatory Visit: Payer: Self-pay

## 2020-08-02 VITALS — BP 96/60 | HR 88 | Resp 20

## 2020-08-02 DIAGNOSIS — Z79899 Other long term (current) drug therapy: Secondary | ICD-10-CM | POA: Diagnosis not present

## 2020-08-02 DIAGNOSIS — E860 Dehydration: Secondary | ICD-10-CM | POA: Diagnosis not present

## 2020-08-02 DIAGNOSIS — F0390 Unspecified dementia without behavioral disturbance: Secondary | ICD-10-CM | POA: Insufficient documentation

## 2020-08-02 DIAGNOSIS — Z87891 Personal history of nicotine dependence: Secondary | ICD-10-CM | POA: Diagnosis not present

## 2020-08-02 DIAGNOSIS — Z818 Family history of other mental and behavioral disorders: Secondary | ICD-10-CM | POA: Diagnosis not present

## 2020-08-02 DIAGNOSIS — Z8249 Family history of ischemic heart disease and other diseases of the circulatory system: Secondary | ICD-10-CM | POA: Insufficient documentation

## 2020-08-02 DIAGNOSIS — D45 Polycythemia vera: Secondary | ICD-10-CM | POA: Insufficient documentation

## 2020-08-02 DIAGNOSIS — N1832 Chronic kidney disease, stage 3b: Secondary | ICD-10-CM | POA: Insufficient documentation

## 2020-08-02 DIAGNOSIS — D631 Anemia in chronic kidney disease: Secondary | ICD-10-CM | POA: Insufficient documentation

## 2020-08-02 DIAGNOSIS — Z803 Family history of malignant neoplasm of breast: Secondary | ICD-10-CM | POA: Diagnosis not present

## 2020-08-02 MED ORDER — SODIUM CHLORIDE 0.9 % IV SOLN
INTRAVENOUS | Status: DC
  Filled 2020-08-02: qty 250

## 2020-08-03 ENCOUNTER — Other Ambulatory Visit (HOSPITAL_COMMUNITY): Payer: Self-pay

## 2020-08-07 ENCOUNTER — Inpatient Hospital Stay: Payer: Medicare Other

## 2020-08-07 ENCOUNTER — Other Ambulatory Visit (HOSPITAL_COMMUNITY): Payer: Self-pay

## 2020-08-07 ENCOUNTER — Other Ambulatory Visit: Payer: Self-pay

## 2020-08-07 VITALS — BP 99/59 | HR 86 | Temp 98.3°F | Resp 18

## 2020-08-07 DIAGNOSIS — N1832 Chronic kidney disease, stage 3b: Secondary | ICD-10-CM | POA: Diagnosis not present

## 2020-08-07 DIAGNOSIS — D45 Polycythemia vera: Secondary | ICD-10-CM | POA: Diagnosis not present

## 2020-08-07 DIAGNOSIS — E860 Dehydration: Secondary | ICD-10-CM | POA: Diagnosis not present

## 2020-08-07 DIAGNOSIS — D631 Anemia in chronic kidney disease: Secondary | ICD-10-CM | POA: Diagnosis not present

## 2020-08-07 DIAGNOSIS — F0390 Unspecified dementia without behavioral disturbance: Secondary | ICD-10-CM | POA: Diagnosis not present

## 2020-08-07 DIAGNOSIS — Z79899 Other long term (current) drug therapy: Secondary | ICD-10-CM | POA: Diagnosis not present

## 2020-08-07 MED ORDER — SODIUM CHLORIDE 0.9 % IV SOLN
Freq: Once | INTRAVENOUS | Status: DC
  Filled 2020-08-07: qty 250

## 2020-08-07 MED ORDER — SODIUM CHLORIDE 0.9 % IV SOLN
Freq: Once | INTRAVENOUS | Status: AC
  Filled 2020-08-07: qty 250

## 2020-08-08 ENCOUNTER — Other Ambulatory Visit: Payer: Self-pay

## 2020-08-08 ENCOUNTER — Other Ambulatory Visit (HOSPITAL_COMMUNITY): Payer: Self-pay

## 2020-08-08 DIAGNOSIS — N1832 Chronic kidney disease, stage 3b: Secondary | ICD-10-CM

## 2020-08-08 DIAGNOSIS — D45 Polycythemia vera: Secondary | ICD-10-CM

## 2020-08-08 DIAGNOSIS — D631 Anemia in chronic kidney disease: Secondary | ICD-10-CM

## 2020-08-09 ENCOUNTER — Other Ambulatory Visit (HOSPITAL_COMMUNITY): Payer: Self-pay

## 2020-08-09 ENCOUNTER — Other Ambulatory Visit: Payer: Self-pay | Admitting: Oncology

## 2020-08-09 ENCOUNTER — Inpatient Hospital Stay: Payer: Medicare Other

## 2020-08-09 ENCOUNTER — Other Ambulatory Visit: Payer: Self-pay

## 2020-08-09 ENCOUNTER — Inpatient Hospital Stay (HOSPITAL_BASED_OUTPATIENT_CLINIC_OR_DEPARTMENT_OTHER): Payer: Medicare Other | Admitting: Oncology

## 2020-08-09 VITALS — BP 145/68 | HR 80 | Temp 97.5°F | Resp 18 | Wt 112.4 lb

## 2020-08-09 DIAGNOSIS — C44329 Squamous cell carcinoma of skin of other parts of face: Secondary | ICD-10-CM | POA: Diagnosis not present

## 2020-08-09 DIAGNOSIS — D45 Polycythemia vera: Secondary | ICD-10-CM | POA: Diagnosis not present

## 2020-08-09 DIAGNOSIS — D7581 Myelofibrosis: Secondary | ICD-10-CM | POA: Diagnosis not present

## 2020-08-09 DIAGNOSIS — D631 Anemia in chronic kidney disease: Secondary | ICD-10-CM

## 2020-08-09 DIAGNOSIS — E860 Dehydration: Secondary | ICD-10-CM

## 2020-08-09 DIAGNOSIS — D75839 Thrombocytosis, unspecified: Secondary | ICD-10-CM | POA: Diagnosis not present

## 2020-08-09 DIAGNOSIS — N1832 Chronic kidney disease, stage 3b: Secondary | ICD-10-CM | POA: Diagnosis not present

## 2020-08-09 DIAGNOSIS — D649 Anemia, unspecified: Secondary | ICD-10-CM

## 2020-08-09 DIAGNOSIS — M549 Dorsalgia, unspecified: Secondary | ICD-10-CM | POA: Diagnosis not present

## 2020-08-09 LAB — CBC WITH DIFFERENTIAL/PLATELET
Abs Immature Granulocytes: 0.46 10*3/uL — ABNORMAL HIGH (ref 0.00–0.07)
Basophils Absolute: 0.1 10*3/uL (ref 0.0–0.1)
Basophils Relative: 1 %
Eosinophils Absolute: 0.4 10*3/uL (ref 0.0–0.5)
Eosinophils Relative: 5 %
HCT: 32 % — ABNORMAL LOW (ref 36.0–46.0)
Hemoglobin: 9.9 g/dL — ABNORMAL LOW (ref 12.0–15.0)
Immature Granulocytes: 5 %
Lymphocytes Relative: 15 %
Lymphs Abs: 1.4 10*3/uL (ref 0.7–4.0)
MCH: 29.1 pg (ref 26.0–34.0)
MCHC: 30.9 g/dL (ref 30.0–36.0)
MCV: 94.1 fL (ref 80.0–100.0)
Monocytes Absolute: 0.9 10*3/uL (ref 0.1–1.0)
Monocytes Relative: 9 %
Neutro Abs: 5.8 10*3/uL (ref 1.7–7.7)
Neutrophils Relative %: 65 %
Platelets: 567 10*3/uL — ABNORMAL HIGH (ref 150–400)
RBC: 3.4 MIL/uL — ABNORMAL LOW (ref 3.87–5.11)
RDW: 23.5 % — ABNORMAL HIGH (ref 11.5–15.5)
WBC: 9 10*3/uL (ref 4.0–10.5)
nRBC: 0.3 % — ABNORMAL HIGH (ref 0.0–0.2)

## 2020-08-09 LAB — COMPREHENSIVE METABOLIC PANEL
ALT: 14 U/L (ref 0–44)
AST: 22 U/L (ref 15–41)
Albumin: 4.2 g/dL (ref 3.5–5.0)
Alkaline Phosphatase: 88 U/L (ref 38–126)
Anion gap: 6 (ref 5–15)
BUN: 24 mg/dL — ABNORMAL HIGH (ref 8–23)
CO2: 26 mmol/L (ref 22–32)
Calcium: 9.2 mg/dL (ref 8.9–10.3)
Chloride: 104 mmol/L (ref 98–111)
Creatinine, Ser: 1.21 mg/dL — ABNORMAL HIGH (ref 0.44–1.00)
GFR, Estimated: 42 mL/min — ABNORMAL LOW (ref >60)
Glucose, Bld: 80 mg/dL (ref 70–99)
Potassium: 4.4 mmol/L (ref 3.5–5.1)
Sodium: 136 mmol/L (ref 135–145)
Total Bilirubin: 0.5 mg/dL (ref 0.3–1.2)
Total Protein: 7.2 g/dL (ref 6.5–8.1)

## 2020-08-09 MED ORDER — SODIUM CHLORIDE 0.9 % IV SOLN
INTRAVENOUS | Status: AC
  Filled 2020-08-09: qty 250

## 2020-08-09 MED ORDER — EPOETIN ALFA-EPBX 10000 UNIT/ML IJ SOLN
10000.0000 [IU] | Freq: Once | INTRAMUSCULAR | Status: AC
  Administered 2020-08-09: 10000 [IU] via SUBCUTANEOUS
  Filled 2020-08-09: qty 1

## 2020-08-09 MED ORDER — SODIUM CHLORIDE 0.9 % IV SOLN
Freq: Once | INTRAVENOUS | Status: AC
Start: 2020-08-09 — End: 2020-08-09
  Filled 2020-08-09: qty 250

## 2020-08-09 NOTE — Progress Notes (Addendum)
[facility]  [address], [address] [place], [place] [address] Phone: [phone]  Fax: [phone]   Clinic Day:  08/09/2020   Referring physician: Glean Hess, MD  Chief Complaint: Laura Wilcox is a 87 y.o. female with polycythemia rubra vera (PV)on Laura Wilcox who is seen for 1 month assessment.   HPI: The patient was last seen in the hematology clinic on 07/10/2020.  At that time, she has been "ok." She has back pain, which started about a month ago. She uses an elevated toilet seat, which has helped. She has not been able to go shopping recently because of her back pain. The area on her left cheek is healing well.  Her appetite is good. She eats well and drinks "some" water when her daughter is around. She has missed an average of 2-3 Jakafi pills per week.  In March 2022, she struggled to drink 5 glasses of water a day. She had paraspinal muscle tenderness. Hematocrit was 31.8, hemoglobin 10.0, MCV 89.6, platelets 595,000, WBC 11,300. BUN was 36 and creatinine was 1.38 (CrCl 36 ml/min). Ferritin was 640 with an iron saturation of 46% and a TIBC of 259. She received IV fluids.  Labs on 06/26/2020 revealed a hematocrit of 30.2, hemoglobin 9.4, MCV 90.7, platelets 578,000, WBC 11,000.  The patient has been receiving IV fluids on Mondays and Wednesdays. She received Retacrit 10,000 units on 06/26/2020.  During the interim, she has missed 5 doses (the week of 07/31/2020) and 1 dose on Monday this week. Her daughter reports "it was just a really bad week" with her memory, and thought processes.   She was scheduled to see Neurology, but did not make the appointment, secondary to refusal to get out of the car. Patient's daughter would like to cancel the idea of her mother seeing a Neurologist at this time.    Past Medical History:  Diagnosis Date  . Confusion 12/30/2018  . Polycythemia   . Urinary tract infection without hematuria 01/02/2020  . Urticaria  06/02/2017    Past Surgical History:  Procedure Laterality Date  . ABDOMINAL HYSTERECTOMY     complete  . BREAST SURGERY      Family History  Problem Relation Age of Onset  . Heart disease Father   . Cancer Sister        breast  . Cancer Sister        breast  . Alzheimer's disease Sister     Social History:  reports that she quit smoking about 42 years ago. She has a 30.00 pack-year smoking history. She has never used smokeless tobacco. She reports that she does not drink alcohol and does not use drugs. She is widowed. Patient is retired Sport and exercise psychologist. Patient moved to [facility] in 03/2017. She lives independently in her own apartment in [place]. Her daughter lives next door.Patient denies known exposures to radiation on toxins.Pt's daughter, Laura Wilcox be reached at [phone]. She lives in [place].  The patient is accompanied by her daughter today.   Allergies: No Known Allergies  Current Medications: Current Outpatient Medications  Medication Sig Dispense Refill  . acetaminophen (TYLENOL) 325 MG tablet Take 650 mg by mouth every 6 (six) hours as needed.    . calcitRIOL (ROCALTROL) 0.25 MCG capsule Take 0.25 mcg by mouth 3 (three) times a week.     . mupirocin ointment (BACTROBAN) 2 % Apply to any open sores daily. 22 g 0  . ruxolitinib phosphate (JAKAFI) 10 MG tablet TAKE 1 TABLET (10 MG  TOTAL) BY MOUTH 2 (TWO) TIMES DAILY. 60 tablet 1   No current facility-administered medications for this visit.    Review of Systems  Constitutional: Negative for fever, malaise/fatigue and weight loss.  HENT: Negative for congestion and hearing loss.   Eyes: Negative for blurred vision and double vision.  Respiratory: Negative for cough.   Cardiovascular: Negative for chest pain and palpitations.  Gastrointestinal: Negative for abdominal pain, constipation, diarrhea, nausea and vomiting.  Genitourinary: Negative for frequency and urgency.  Skin: Negative for rash.  Neurological: Negative for  dizziness, tingling and headaches.  Endo/Heme/Allergies: Does not bruise/bleed easily.  Psychiatric/Behavioral: Positive for memory loss. Negative for depression. The patient is not nervous/anxious and does not have insomnia.     Performance status (ECOG):  2-3  Vitals Blood pressure (!) 145/68, pulse 80, temperature (!) 97.5 F (36.4 C), temperature source Tympanic, resp. rate 18, weight 51 kg.    Physical Exam Vitals and nursing note reviewed.  HENT:     Head: Normocephalic and atraumatic.     Nose: No congestion.     Mouth/Throat:     Mouth: Mucous membranes are moist.     Pharynx: Oropharynx is clear.  Eyes:     General:        Right eye: No discharge.        Left eye: No discharge.     Extraocular Movements: Extraocular movements intact.     Pupils: Pupils are equal, round, and reactive to light.  Cardiovascular:     Rate and Rhythm: Normal rate and regular rhythm.     Pulses: Normal pulses.     Heart sounds: No murmur heard.   Pulmonary:     Effort: Pulmonary effort is normal.  Abdominal:     General: Bowel sounds are normal. There is no distension.     Tenderness: There is no abdominal tenderness. There is no guarding.  Musculoskeletal:        General: No swelling or deformity.     Right lower leg: No edema.     Left lower leg: No edema.  Skin:    General: Skin is warm and dry.     Capillary Refill: Capillary refill takes less than 2 seconds.  Neurological:     Mental Status: She is alert and oriented to person, place, and time.     Motor: No weakness.     Gait: Gait normal.  Psychiatric:        Attention and Perception: Attention normal.        Mood and Affect: Mood is anxious.        Cognition and Memory: Cognition is impaired. Memory is impaired.     Appointment on 08/09/2020  Component Date Value Ref Range Status  . WBC 08/09/2020 9.0  4.0 - 10.5 K/uL Final  . RBC 08/09/2020 3.40* 3.87 - 5.11 MIL/uL Final  . Hemoglobin 08/09/2020 9.9* 12.0 - 15.0  g/dL Final  . HCT 08/09/2020 32.0* 36.0 - 46.0 % Final  . MCV 08/09/2020 94.1  80.0 - 100.0 fL Final  . MCH 08/09/2020 29.1  26.0 - 34.0 pg Final  . MCHC 08/09/2020 30.9  30.0 - 36.0 g/dL Final  . RDW 08/09/2020 23.5* 11.5 - 15.5 % Final  . Platelets 08/09/2020 567* 150 - 400 K/uL Final  . nRBC 08/09/2020 0.3* 0.0 - 0.2 % Final  . Neutrophils Relative % 08/09/2020 65  % Final  . Neutro Abs 08/09/2020 5.8  1.7 - 7.7 K/uL Final  .  Lymphocytes Relative 08/09/2020 15  % Final  . Lymphs Abs 08/09/2020 1.4  0.7 - 4.0 K/uL Final  . Monocytes Relative 08/09/2020 9  % Final  . Monocytes Absolute 08/09/2020 0.9  0.1 - 1.0 K/uL Final  . Eosinophils Relative 08/09/2020 5  % Final  . Eosinophils Absolute 08/09/2020 0.4  0.0 - 0.5 K/uL Final  . Basophils Relative 08/09/2020 1  % Final  . Basophils Absolute 08/09/2020 0.1  0.0 - 0.1 K/uL Final  . Immature Granulocytes 08/09/2020 5  % Final  . Abs Immature Granulocytes 08/09/2020 0.46* 0.00 - 0.07 K/uL Final   Performed at [facility], [address], [place], [place] [address]   CMP     Component Value Date/Time   NA 136 08/09/2020 1049   K 4.4 08/09/2020 1049   CL 104 08/09/2020 1049   CO2 26 08/09/2020 1049   GLUCOSE 80 08/09/2020 1049   BUN 24 (H) 08/09/2020 1049   CREATININE 1.21 (H) 08/09/2020 1049   CALCIUM 9.2 08/09/2020 1049   PROT 7.2 08/09/2020 1049   ALBUMIN 4.2 08/09/2020 1049   AST 22 08/09/2020 1049   ALT 14 08/09/2020 1049   ALKPHOS 88 08/09/2020 1049   BILITOT 0.5 08/09/2020 1049   GFRNONAA 42 (L) 08/09/2020 1049   GFRAA 49 (L) 12/27/2019 0935    Assessment:  Britiney Blahnik is a 87 y.o. female with polycythemia rubra veraand secondary myelofibrosis. She presented in 04/2002 with thrombocytosis (835,000). JAK2 V617Fwas positive on 05/20/2012.  She developed pancytopeniain 12/2015. Etiology is unclear (medication confusion or hydroxyurea + Agrylin).  She received Procrit40,000 units  (intermittently 09/2012 - 06/2016), Venofer(09/2012 - 10/2012), and Infed(12/2015 - 03/2016). She received Neupogenduring a period of neutropenia. She was anagrelidefrom 2004 - 08/13/2017. She beganJakafion 08/14/2017. Jakafi was increased from 5 mg BID to 10 mg BID on03/18/2020.Jakafi was increased to 15 mg BID on 08/03/2018.She has been back on Jakafi 5 mg BID since 02/01/2019 then 10 mg BID on 05/25/2019.  Laura Wilcox was on hold from 12/20/2019 - 01/12/2020.  Jakafi 5 mg twice daily restarted on 01/12/2020 then increased to 10 mg in the morning and 5 mg in the evening on 02/02/2020. Laura Wilcox is currently 20 mg po BID.  She has received Retacrit to maintain a hemoglobin > 10 (12/24/2019 and 06/26/2020).  She began a phlebotomy programon 08/06/2017. She undergoes small volume (150 cc with replacement of 150 cc NS)if her HCT >42.  Ferritinhas been followed: 529 on 09/03/2017, 1020 on 12/03/2017, 423 on 02/18/2018, 858 on 11/17/2018, 785 on 09/23/2019 and 728 on 12/20/2019.Iron saturationwas 37%and TIBC 270 on08/18/2020.B12 was 477 and folate 36 on 11/17/2018.  B12 was 292 and folate 19.1 on 12/20/2019  The patient has received both doses of the COVID-19 vaccine.  Symptomatically, she continues to struggle with fluid intake, and she states "I just cant seem to wake up today".  She continues to miss some of her Jakafi doses.  He daughter works with her daily.  Exam is stable.  Plan: 1.   Labs today: CBC with diff, CMP 2.Polycythemia rubra vera  Labs from 08/09/2020 showed platelet count of 567,000.  She continues to not be in goal secondary to frequently missed dosings due to memory impairment. Platelet goal is < 400,000. She is on Jakafi 20 mg p.o twice daily. She has a history of anemia with hydroxyurea as well as Jakafi when platelet count is controlled. She has done well with minimal Retacrit support to balance anemia and thrombocytosis.  Labs  show hemoglobin of 9.9.  She  is not having any symptoms. 3.Renal insufficiency  BUN 24 Creatinine 1.21 today.  She continues to require daily encouragement to drink her fluid goal of 5 glasses a day. Patient encouraged to maintain fluid chart (glasses of water). Proceed with 1 liter NaCl today over 2 hours. She will return to clinic next week for additional IV fluids. 4.Anemia  Hemoglobin is 9.9.             Ferritin 640 with an iron saturation of 46% and a TIBC 259.  Iron saturation has fluctuated.  Iron stores were absent in bone marrow in 2014.             B12 was 292 (low normal) with a folate of 19.1 on 12/20/2019.             B12 goal is 400.             She received Retacrit on 06/26/2020.  Anticipate Retacrit monthly with current dose of Jakafi to maintain hemoglobin > 10.  Proceed with Retacrit today. 5.   Dementia             She has severe underlying dementia.             She has declined neurology consultation on multiple occasions.  Patient would not get out of the car for her appointment on 07/31/2020 with Dr. Brigitte Pulse.  Daughter would like to defer this appointment at this time.  6.   Squamous cell carcinoma left cheek  She is s/p biopsy in 10/2017 by Dr Phillip Heal.  She was seen by Dr. Lacinda Axon at [facility] on 04/11/2020.   She was noted to have a 3.5-4 cm keratotic plaque with abundant scale c/w squamous cell carcinoma in situ.  Treatment options were discussed (Moh's, radiation, topicals).  She underwent cryotherapy by Dr Nehemiah Massed on 05/25/2020.  She underwent debridement on 06/01/2020. 7.   Recurrent dehydration  BUN 24 Creatinine 1.21 today.   Proceed with 1 L NaCl today while in clinic.  She will return to clinic next week for additional IV fluids.  Continue to encourage water chart.  Patient's daughter to contact clinic if any interval concerns for dehydration.  Patient's daughter reports weekly fluids "have definitely kept her out of the ED."   Disposition  Retacrit today. Fluid bolus today (1064mL  over 2 hours).  RTC on Tuesday and Friday x 3 weeks for IVF. Patient would like afternoon appointments.  RTC in 1 month for MD assessment, labs (CBC with diff, CMP, B12) and +/- Retacrit.  The patient's diagnosis, an outline of the further diagnostic and laboratory studies which will be required, the recommendation for surgery, and alternatives were discussed with her and her accompanying family members.  All questions were answered to their satisfaction.  I personally had a face to face interaction and evaluated the patient jointly with the NP Student, Mrs. Benedetto Goad.  I have reviewed her history and available records and have performed the key portions of the physical exam including general, HEENT, abdominal exam, pelvic exam with my findings confirming those documented above by the APP student.  I have discussed the case with the APP student and the patient.  I agree with the above documentation, assessment and plan which was fully formulated by me.  Counseling was completed by me.    Benedetto Goad, Student FNP  Greater than 50% was spent in counseling and coordination of care with this patient including but not  limited to discussion of the relevant topics above (See A&P) including, but not limited to diagnosis and management of acute and chronic medical conditions.   Faythe Casa, NP 08/10/2020 1:18 PM

## 2020-08-10 MED ORDER — RUXOLITINIB PHOSPHATE 10 MG PO TABS
ORAL_TABLET | Freq: Two times a day (BID) | ORAL | 1 refills | Status: DC
  Filled 2020-08-23: qty 60, 30d supply, fill #0
  Filled 2020-09-18: qty 60, 30d supply, fill #1

## 2020-08-10 NOTE — Telephone Encounter (Signed)
Can you sign this for me?  Faythe Casa, NP 08/10/2020 11:02 AM

## 2020-08-11 ENCOUNTER — Inpatient Hospital Stay: Payer: Medicare Other

## 2020-08-11 VITALS — BP 110/64 | HR 80 | Resp 18

## 2020-08-11 DIAGNOSIS — F0390 Unspecified dementia without behavioral disturbance: Secondary | ICD-10-CM | POA: Diagnosis not present

## 2020-08-11 DIAGNOSIS — E860 Dehydration: Secondary | ICD-10-CM

## 2020-08-11 DIAGNOSIS — N1832 Chronic kidney disease, stage 3b: Secondary | ICD-10-CM | POA: Diagnosis not present

## 2020-08-11 DIAGNOSIS — D45 Polycythemia vera: Secondary | ICD-10-CM | POA: Diagnosis not present

## 2020-08-11 DIAGNOSIS — Z79899 Other long term (current) drug therapy: Secondary | ICD-10-CM | POA: Diagnosis not present

## 2020-08-11 DIAGNOSIS — D631 Anemia in chronic kidney disease: Secondary | ICD-10-CM | POA: Diagnosis not present

## 2020-08-11 MED ORDER — SODIUM CHLORIDE 0.9 % IV SOLN
Freq: Once | INTRAVENOUS | Status: AC
  Filled 2020-08-11: qty 250

## 2020-08-15 ENCOUNTER — Other Ambulatory Visit: Payer: Self-pay

## 2020-08-15 ENCOUNTER — Inpatient Hospital Stay: Payer: Medicare Other

## 2020-08-15 VITALS — BP 96/64 | HR 78 | Resp 18

## 2020-08-15 DIAGNOSIS — E860 Dehydration: Secondary | ICD-10-CM | POA: Diagnosis not present

## 2020-08-15 DIAGNOSIS — D631 Anemia in chronic kidney disease: Secondary | ICD-10-CM | POA: Diagnosis not present

## 2020-08-15 DIAGNOSIS — F0390 Unspecified dementia without behavioral disturbance: Secondary | ICD-10-CM | POA: Diagnosis not present

## 2020-08-15 DIAGNOSIS — Z79899 Other long term (current) drug therapy: Secondary | ICD-10-CM | POA: Diagnosis not present

## 2020-08-15 DIAGNOSIS — N1832 Chronic kidney disease, stage 3b: Secondary | ICD-10-CM | POA: Diagnosis not present

## 2020-08-15 DIAGNOSIS — D45 Polycythemia vera: Secondary | ICD-10-CM | POA: Diagnosis not present

## 2020-08-15 MED ORDER — SODIUM CHLORIDE 0.9 % IV SOLN
Freq: Once | INTRAVENOUS | Status: AC
  Filled 2020-08-15: qty 250

## 2020-08-15 NOTE — Patient Instructions (Signed)
Dehydration, Adult Dehydration is condition in which there is not enough water or other fluids in the body. This happens when a person loses more fluids than he or she takes in. Important body parts cannot work right without the right amount of fluids. Any loss of fluids from the body can cause dehydration. Dehydration can be mild, worse, or very bad. It should be treated right away to keep it from getting very bad. What are the causes? This condition may be caused by:  Conditions that cause loss of water or other fluids, such as: ? Watery poop (diarrhea). ? Vomiting. ? Sweating a lot. ? Peeing (urinating) a lot.  Not drinking enough fluids, especially when you: ? Are ill. ? Are doing things that take a lot of energy to do.  Other illnesses and conditions, such as fever or infection.  Certain medicines, such as medicines that take extra fluid out of the body (diuretics).  Lack of safe drinking water.  Not being able to get enough water and food. What increases the risk? The following factors may make you more likely to develop this condition:  Having a long-term (chronic) illness that has not been treated the right way, such as: ? Diabetes. ? Heart disease. ? Kidney disease.  Being 65 years of age or older.  Having a disability.  Living in a place that is high above the ground or sea (high in altitude). The thinner, dried air causes more fluid loss.  Doing exercises that put stress on your body for a long time. What are the signs or symptoms? Symptoms of dehydration depend on how bad it is. Mild or worse dehydration  Thirst.  Dry lips or dry mouth.  Feeling dizzy or light-headed, especially when you stand up from sitting.  Muscle cramps.  Your body making: ? Dark pee (urine). Pee may be the color of tea. ? Less pee than normal. ? Less tears than normal.  Headache. Very bad dehydration  Changes in skin. Skin may: ? Be cold to the touch (clammy). ? Be blotchy  or pale. ? Not go back to normal right after you lightly pinch it and let it go.  Little or no tears, pee, or sweat.  Changes in vital signs, such as: ? Fast breathing. ? Low blood pressure. ? Weak pulse. ? Pulse that is more than 100 beats a minute when you are sitting still.  Other changes, such as: ? Feeling very thirsty. ? Eyes that look hollow (sunken). ? Cold hands and feet. ? Being mixed up (confused). ? Being very tired (lethargic) or having trouble waking from sleep. ? Short-term weight loss. ? Loss of consciousness. How is this treated? Treatment for this condition depends on how bad it is. Treatment should start right away. Do not wait until your condition gets very bad. Very bad dehydration is an emergency. You will need to go to a hospital.  Mild or worse dehydration can be treated at home. You may be asked to: ? Drink more fluids. ? Drink an oral rehydration solution (ORS). This drink helps get the right amounts of fluids and salts and minerals in the blood (electrolytes).  Very bad dehydration can be treated: ? With fluids through an IV tube. ? By getting normal levels of salts and minerals in your blood. This is often done by giving salts and minerals through a tube. The tube is passed through your nose and into your stomach. ? By treating the root cause. Follow these instructions at   home: Oral rehydration solution If told by your doctor, drink an ORS:  Make an ORS. Use instructions on the package.  Start by drinking small amounts, about  cup (120 mL) every 5-10 minutes.  Slowly drink more until you have had the amount that your doctor said to have. Eating and drinking  Drink enough clear fluid to keep your pee pale yellow. If you were told to drink an ORS, finish the ORS first. Then, start slowly drinking other clear fluids. Drink fluids such as: ? Water. Do not drink only water. Doing that can make the salt (sodium) level in your body get too low. ? Water  from ice chips you suck on. ? Fruit juice that you have added water to (diluted). ? Low-calorie sports drinks.  Eat foods that have the right amounts of salts and minerals, such as: ? Bananas. ? Oranges. ? Potatoes. ? Tomatoes. ? Spinach.  Do not drink alcohol.  Avoid: ? Drinks that have a lot of sugar. These include:  High-calorie sports drinks.  Fruit juice that you did not add water to.  Soda.  Caffeine. ? Foods that are greasy or have a lot of fat or sugar.         General instructions  Take over-the-counter and prescription medicines only as told by your doctor.  Do not take salt tablets. Doing that can make the salt level in your body get too high.  Return to your normal activities as told by your doctor. Ask your doctor what activities are safe for you.  Keep all follow-up visits as told by your doctor. This is important. Contact a doctor if:  You have pain in your belly (abdomen) and the pain: ? Gets worse. ? Stays in one place.  You have a rash.  You have a stiff neck.  You get angry or annoyed (irritable) more easily than normal.  You are more tired or have a harder time waking than normal.  You feel: ? Weak or dizzy. ? Very thirsty. Get help right away if you have:  Any symptoms of very bad dehydration.  Symptoms of vomiting, such as: ? You cannot eat or drink without vomiting. ? Your vomiting gets worse or does not go away. ? Your vomit has blood or green stuff in it.  Symptoms that get worse with treatment.  A fever.  A very bad headache.  Problems with peeing or pooping (having a bowel movement), such as: ? Watery poop that gets worse or does not go away. ? Blood in your poop (stool). This may cause poop to look black and tarry. ? Not peeing in 6-8 hours. ? Peeing only a small amount of very dark pee in 6-8 hours.  Trouble breathing. These symptoms may be an emergency. Do not wait to see if the symptoms will go away. Get  medical help right away. Call your local emergency services (911 in the [place] Do not drive yourself to the hospital. Summary  Dehydration is a condition in which there is not enough water or other fluids in the body. This happens when a person loses more fluids than he or she takes in.  Treatment for this condition depends on how bad it is. Treatment should be started right away. Do not wait until your condition gets very bad.  Drink enough clear fluid to keep your pee pale yellow. If you were told to drink an oral rehydration solution (ORS), finish the ORS first. Then, start slowly drinking other clear fluids.    Take over-the-counter and prescription medicines only as told by your doctor.  Get help right away if you have any symptoms of very bad dehydration. This information is not intended to replace advice given to you by your health care provider. Make sure you discuss any questions you have with your health care provider. Document Revised: 10/29/2018 Document Reviewed: 10/29/2018 Elsevier Patient Education  2021 Elsevier Inc.  

## 2020-08-18 ENCOUNTER — Inpatient Hospital Stay: Payer: Medicare Other

## 2020-08-18 VITALS — BP 96/75 | HR 71 | Temp 96.8°F | Resp 18 | Wt 115.0 lb

## 2020-08-18 DIAGNOSIS — E860 Dehydration: Secondary | ICD-10-CM | POA: Diagnosis not present

## 2020-08-18 DIAGNOSIS — F0390 Unspecified dementia without behavioral disturbance: Secondary | ICD-10-CM | POA: Diagnosis not present

## 2020-08-18 DIAGNOSIS — D631 Anemia in chronic kidney disease: Secondary | ICD-10-CM | POA: Diagnosis not present

## 2020-08-18 DIAGNOSIS — Z79899 Other long term (current) drug therapy: Secondary | ICD-10-CM | POA: Diagnosis not present

## 2020-08-18 DIAGNOSIS — D45 Polycythemia vera: Secondary | ICD-10-CM | POA: Diagnosis not present

## 2020-08-18 DIAGNOSIS — N1832 Chronic kidney disease, stage 3b: Secondary | ICD-10-CM | POA: Diagnosis not present

## 2020-08-18 MED ORDER — SODIUM CHLORIDE 0.9 % IV SOLN
Freq: Once | INTRAVENOUS | Status: AC
Start: 2020-08-18 — End: 2020-08-18
  Filled 2020-08-18: qty 250

## 2020-08-18 NOTE — Progress Notes (Signed)
Pt received 2 hours of IV hydration. Tolerated well. Discharged to home accompanied by daughter.

## 2020-08-22 ENCOUNTER — Inpatient Hospital Stay: Payer: Medicare Other

## 2020-08-22 ENCOUNTER — Other Ambulatory Visit: Payer: Self-pay

## 2020-08-22 VITALS — BP 96/50 | HR 74 | Temp 97.0°F | Resp 18

## 2020-08-22 DIAGNOSIS — N1832 Chronic kidney disease, stage 3b: Secondary | ICD-10-CM | POA: Diagnosis not present

## 2020-08-22 DIAGNOSIS — D631 Anemia in chronic kidney disease: Secondary | ICD-10-CM | POA: Diagnosis not present

## 2020-08-22 DIAGNOSIS — D649 Anemia, unspecified: Secondary | ICD-10-CM

## 2020-08-22 DIAGNOSIS — F0390 Unspecified dementia without behavioral disturbance: Secondary | ICD-10-CM | POA: Diagnosis not present

## 2020-08-22 DIAGNOSIS — Z79899 Other long term (current) drug therapy: Secondary | ICD-10-CM | POA: Diagnosis not present

## 2020-08-22 DIAGNOSIS — D45 Polycythemia vera: Secondary | ICD-10-CM | POA: Diagnosis not present

## 2020-08-22 DIAGNOSIS — E860 Dehydration: Secondary | ICD-10-CM | POA: Diagnosis not present

## 2020-08-22 MED ORDER — SODIUM CHLORIDE 0.9 % IV SOLN
Freq: Once | INTRAVENOUS | Status: AC
  Filled 2020-08-22: qty 250

## 2020-08-23 ENCOUNTER — Telehealth: Payer: Self-pay | Admitting: Pharmacy Technician

## 2020-08-23 ENCOUNTER — Other Ambulatory Visit (HOSPITAL_COMMUNITY): Payer: Self-pay

## 2020-08-23 NOTE — Telephone Encounter (Signed)
Oral Oncology Patient Advocate Encounter   Was successful in securing patient an $54,700 grant from Patient [place] [facility]) to provide copayment coverage for Jakafi.  This will keep the out of pocket expense at $0.     The billing information is as follows and has been shared with [employer].   Member ID: [id number] Group ID: [id number] RxBin: [id number] Dates of Eligibility: 08/08/20 through 08/07/21  Fund:  [place] Negative Myeloproliferative Neoplasms  [place] Patient [place] Phone [phone] Fax [phone] 08/23/2020 8:39 AM

## 2020-08-25 ENCOUNTER — Other Ambulatory Visit: Payer: Self-pay

## 2020-08-25 ENCOUNTER — Inpatient Hospital Stay: Payer: Medicare Other

## 2020-08-25 VITALS — BP 98/66 | HR 72 | Resp 18

## 2020-08-25 DIAGNOSIS — D631 Anemia in chronic kidney disease: Secondary | ICD-10-CM | POA: Diagnosis not present

## 2020-08-25 DIAGNOSIS — D45 Polycythemia vera: Secondary | ICD-10-CM | POA: Diagnosis not present

## 2020-08-25 DIAGNOSIS — E860 Dehydration: Secondary | ICD-10-CM | POA: Diagnosis not present

## 2020-08-25 DIAGNOSIS — Z79899 Other long term (current) drug therapy: Secondary | ICD-10-CM | POA: Diagnosis not present

## 2020-08-25 DIAGNOSIS — F0390 Unspecified dementia without behavioral disturbance: Secondary | ICD-10-CM | POA: Diagnosis not present

## 2020-08-25 DIAGNOSIS — N1832 Chronic kidney disease, stage 3b: Secondary | ICD-10-CM | POA: Diagnosis not present

## 2020-08-25 MED ORDER — SODIUM CHLORIDE 0.9 % IV SOLN
Freq: Once | INTRAVENOUS | Status: AC
  Filled 2020-08-25: qty 250

## 2020-08-29 ENCOUNTER — Inpatient Hospital Stay: Payer: Medicare Other

## 2020-09-01 ENCOUNTER — Inpatient Hospital Stay: Payer: Medicare Other

## 2020-09-01 ENCOUNTER — Other Ambulatory Visit: Payer: Self-pay

## 2020-09-01 ENCOUNTER — Telehealth: Payer: Self-pay | Admitting: Oncology

## 2020-09-01 NOTE — Telephone Encounter (Signed)
She can come once per week. I don't want to overload her either. Does she have lab appt soon?

## 2020-09-01 NOTE — Telephone Encounter (Signed)
Pt's daughter, [name], states that she believes the pt will need more IVF. She would like at least 2-3 times a week. Is this ok? Or did you have something else you would like to do?

## 2020-09-01 NOTE — Telephone Encounter (Signed)
She is scheduled 6/14 for labs.

## 2020-09-04 ENCOUNTER — Telehealth: Payer: Self-pay | Admitting: Oncology

## 2020-09-04 ENCOUNTER — Inpatient Hospital Stay: Payer: Medicare Other | Attending: Oncology

## 2020-09-04 VITALS — BP 135/62 | HR 94 | Temp 96.0°F | Resp 17

## 2020-09-04 DIAGNOSIS — D631 Anemia in chronic kidney disease: Secondary | ICD-10-CM | POA: Diagnosis not present

## 2020-09-04 DIAGNOSIS — E860 Dehydration: Secondary | ICD-10-CM | POA: Diagnosis not present

## 2020-09-04 DIAGNOSIS — N1832 Chronic kidney disease, stage 3b: Secondary | ICD-10-CM | POA: Insufficient documentation

## 2020-09-04 DIAGNOSIS — D45 Polycythemia vera: Secondary | ICD-10-CM | POA: Diagnosis not present

## 2020-09-04 MED ORDER — SODIUM CHLORIDE 0.9 % IV SOLN
Freq: Once | INTRAVENOUS | Status: AC
  Filled 2020-09-04: qty 250

## 2020-09-04 NOTE — Patient Instructions (Signed)
Dehydration, Adult Dehydration is a condition in which there is not enough water or other fluids in the body. This happens when a person loses more fluids than he or she takes in. Important organs, such as the kidneys, brain, and heart, cannot function without a proper amount of fluids. Any loss of fluids from the body can lead to dehydration. Dehydration can be mild, moderate, or severe. It should be treated right away to prevent it from becoming severe. What are the causes? Dehydration may be caused by:  Conditions that cause loss of water or other fluids, such as diarrhea, vomiting, or sweating or urinating a lot.  Not drinking enough fluids, especially when you are ill or doing activities that require a lot of energy.  Other illnesses and conditions, such as fever or infection.  Certain medicines, such as medicines that remove excess fluid from the body (diuretics).  Lack of safe drinking water.  Not being able to get enough water and food. What increases the risk? The following factors may make you more likely to develop this condition:  Having a long-term (chronic) illness that has not been treated properly, such as diabetes, heart disease, or kidney disease.  Being 65 years of age or older.  Having a disability.  Living in a place that is high in altitude, where thinner, drier air causes more fluid loss.  Doing exercises that put stress on your body for a long time (endurance sports). What are the signs or symptoms? Symptoms of dehydration depend on how severe it is. Mild or moderate dehydration  Thirst.  Dry lips or dry mouth.  Dizziness or light-headedness, especially when standing up from a seated position.  Muscle cramps.  Dark urine. Urine may be the color of tea.  Less urine or tears produced than usual.  Headache. Severe dehydration  Changes in skin. Your skin may be cold and clammy, blotchy, or pale. Your skin also may not return to normal after being  lightly pinched and released.  Little or no tears, urine, or sweat.  Changes in vital signs, such as rapid breathing and low blood pressure. Your pulse may be weak or may be faster than 100 beats a minute when you are sitting still.  Other changes, such as: ? Feeling very thirsty. ? Sunken eyes. ? Cold hands and feet. ? Confusion. ? Being very tired (lethargic) or having trouble waking from sleep. ? Short-term weight loss. ? Loss of consciousness. How is this diagnosed? This condition is diagnosed based on your symptoms and a physical exam. You may have blood and urine tests to help confirm the diagnosis. How is this treated? Treatment for this condition depends on how severe it is. Treatment should be started right away. Do not wait until dehydration becomes severe. Severe dehydration is an emergency and needs to be treated in a hospital.  Mild or moderate dehydration can be treated at home. You may be asked to: ? Drink more fluids. ? Drink an oral rehydration solution (ORS). This drink helps restore proper amounts of fluids and salts and minerals in the blood (electrolytes).  Severe dehydration can be treated: ? With IV fluids. ? By correcting abnormal levels of electrolytes. This is often done by giving electrolytes through a tube that is passed through your nose and into your stomach (nasogastric tube, or NG tube). ? By treating the underlying cause of dehydration. Follow these instructions at home: Oral rehydration solution If told by your health care provider, drink an ORS:  Make   an ORS by following instructions on the package.  Start by drinking small amounts, about  cup (120 mL) every 5-10 minutes.  Slowly increase how much you drink until you have taken the amount recommended by your health care provider. Eating and drinking  Drink enough clear fluid to keep your urine pale yellow. If you were told to drink an ORS, finish the ORS first and then start slowly drinking  other clear fluids. Drink fluids such as: ? Water. Do not drink only water. Doing that can lead to hyponatremia, which is having too little salt (sodium) in the body. ? Water from ice chips you suck on. ? Fruit juice that you have added water to (diluted fruit juice). ? Low-calorie sports drinks.  Eat foods that contain a healthy balance of electrolytes, such as bananas, oranges, potatoes, tomatoes, and spinach.  Do not drink alcohol.  Avoid the following: ? Drinks that contain a lot of sugar. These include high-calorie sports drinks, fruit juice that is not diluted, and soda. ? Caffeine. ? Foods that are greasy or contain a lot of fat or sugar.         General instructions  Take over-the-counter and prescription medicines only as told by your health care provider.  Do not take sodium tablets. Doing that can lead to having too much sodium in the body (hypernatremia).  Return to your normal activities as told by your health care provider. Ask your health care provider what activities are safe for you.  Keep all follow-up visits as told by your health care provider. This is important. Contact a health care provider if:  You have muscle cramps, pain, or discomfort, such as: ? Pain in your abdomen and the pain gets worse or stays in one area (localizes). ? Stiff neck.  You have a rash.  You are more irritable than usual.  You are sleepier or have a harder time waking than usual.  You feel weak or dizzy.  You feel very thirsty. Get help right away if you have:  Any symptoms of severe dehydration.  Symptoms of vomiting, such as: ? You cannot eat or drink without vomiting. ? Vomiting gets worse or does not go away. ? Vomit includes blood or green matter (bile).  Symptoms that get worse with treatment.  A fever.  A severe headache.  Problems with urination or bowel movements, such as: ? Diarrhea that gets worse or does not go away. ? Blood in your stool (feces).  This may cause stool to look black and tarry. ? Not urinating, or urinating only a small amount of very dark urine, within 6-8 hours.  Trouble breathing. These symptoms may represent a serious problem that is an emergency. Do not wait to see if the symptoms will go away. Get medical help right away. Call your local emergency services (911 in the [place] Do not drive yourself to the hospital. Summary  Dehydration is a condition in which there is not enough water or other fluids in the body. This happens when a person loses more fluids than he or she takes in.  Treatment for this condition depends on how severe it is. Treatment should be started right away. Do not wait until dehydration becomes severe.  Drink enough clear fluid to keep your urine pale yellow. If you were told to drink an oral rehydration solution (ORS), finish the ORS first and then start slowly drinking other clear fluids.  Take over-the-counter and prescription medicines only as told by your health   care provider.  Get help right away if you have any symptoms of severe dehydration. This information is not intended to replace advice given to you by your health care provider. Make sure you discuss any questions you have with your health care provider. Document Revised: 10/29/2018 Document Reviewed: 10/29/2018 Elsevier Patient Education  2021 [employer]  

## 2020-09-04 NOTE — Telephone Encounter (Signed)
Patient's daughter has called in stating that she has been on IVF x 2 a week while under Dr. Kem Parkinson care. She is coming in today for IVF (scheduled for labs/Jennifer Burns on 6/14). Daughter feels like this is too far without IVF as they "struggle with keeping her hydrated" and she has had hx of having to go to the ED for this reason.

## 2020-09-05 ENCOUNTER — Encounter: Payer: Self-pay | Admitting: Hematology and Oncology

## 2020-09-06 ENCOUNTER — Ambulatory Visit (INDEPENDENT_AMBULATORY_CARE_PROVIDER_SITE_OTHER): Payer: Medicare Other | Admitting: Dermatology

## 2020-09-06 ENCOUNTER — Other Ambulatory Visit: Payer: Self-pay

## 2020-09-06 ENCOUNTER — Encounter: Payer: Self-pay | Admitting: Hematology and Oncology

## 2020-09-06 DIAGNOSIS — D0461 Carcinoma in situ of skin of right upper limb, including shoulder: Secondary | ICD-10-CM

## 2020-09-06 DIAGNOSIS — D099 Carcinoma in situ, unspecified: Secondary | ICD-10-CM

## 2020-09-06 DIAGNOSIS — L570 Actinic keratosis: Secondary | ICD-10-CM | POA: Diagnosis not present

## 2020-09-06 DIAGNOSIS — Z86007 Personal history of in-situ neoplasm of skin: Secondary | ICD-10-CM

## 2020-09-06 DIAGNOSIS — L578 Other skin changes due to chronic exposure to nonionizing radiation: Secondary | ICD-10-CM

## 2020-09-06 DIAGNOSIS — C44622 Squamous cell carcinoma of skin of right upper limb, including shoulder: Secondary | ICD-10-CM

## 2020-09-06 DIAGNOSIS — D492 Neoplasm of unspecified behavior of bone, soft tissue, and skin: Secondary | ICD-10-CM

## 2020-09-06 DIAGNOSIS — L820 Inflamed seborrheic keratosis: Secondary | ICD-10-CM

## 2020-09-06 DIAGNOSIS — C4492 Squamous cell carcinoma of skin, unspecified: Secondary | ICD-10-CM

## 2020-09-06 HISTORY — DX: Squamous cell carcinoma of skin, unspecified: C44.92

## 2020-09-06 NOTE — Progress Notes (Signed)
Follow-Up Visit   Subjective  Laura Wilcox is a 87 y.o. female who presents for the following: Follow-up (6 weeks f/u hx of Aks and ISK ). Pt c/o irritated growths on her hands and face.   Daughter with pt who contributes to history.  The following portions of the chart were reviewed this encounter and updated as appropriate:   Tobacco  Allergies  Meds  Problems  Med Hx  Surg Hx  Fam Hx      Review of Systems:  No other skin or systemic complaints except as noted in HPI or Assessment and Plan.  Objective  Well appearing patient in no apparent distress; mood and affect are within normal limits.  A focused examination was performed including face, arms,chest. Relevant physical exam findings are noted in the Assessment and Plan.  Right thumb 1.1 cm hyperkeratotic papule   hands x 8, right nose x 1 (9) Erythematous thin papules/macules with gritty scale.   left cheek zygoma x 1, Erythematous keratotic or waxy stuck-on papule or plaque.   left medial cheek Well healed scar with no evidence of recurrence.   Right med dorsum hand Keratotic pink papule/nodule or plaque.    Assessment & Plan  Neoplasm of skin Right thumb  Epidermal / dermal shaving  Lesion diameter (cm):  1.1 Informed consent: discussed and consent obtained   Timeout: patient name, date of birth, surgical site, and procedure verified   Procedure prep:  Patient was prepped and draped in usual sterile fashion Prep type:  Isopropyl alcohol Anesthesia: the lesion was anesthetized in a standard fashion   Anesthetic:  1% lidocaine w/ epinephrine 1-100,000 buffered w/ 8.4% NaHCO3 Hemostasis achieved with: pressure, aluminum chloride and electrodesiccation   Outcome: patient tolerated procedure well   Post-procedure details: sterile dressing applied and wound care instructions given   Dressing type: bandage and petrolatum    Destruction of lesion  Destruction method: electrodesiccation and curettage    Informed consent: discussed and consent obtained   Timeout:  patient name, date of birth, surgical site, and procedure verified Anesthesia: the lesion was anesthetized in a standard fashion   Anesthetic:  1% lidocaine w/ epinephrine 1-100,000 buffered w/ 8.4% NaHCO3 Curettage performed in three different directions: Yes   Electrodesiccation performed over the curetted area: Yes   Curettage cycles:  3 Lesion length (cm):  1.1 Lesion width (cm):  1.1 Margin per side (cm):  0.2 Final wound size (cm):  1.5 Hemostasis achieved with:  electrodesiccation Outcome: patient tolerated procedure well with no complications   Post-procedure details: sterile dressing applied and wound care instructions given   Dressing type: petrolatum    Specimen 1 - Surgical pathology Differential Diagnosis: R/O SCCis  Check Margins: No 1.1 cm hyperkeratotic papule  AK (actinic keratosis) (9) hands x 8, right nose x 1  Destruction of lesion - hands x 8, right nose x 1 Complexity: simple   Destruction method: cryotherapy   Informed consent: discussed and consent obtained   Timeout:  patient name, date of birth, surgical site, and procedure verified Lesion destroyed using liquid nitrogen: Yes   Region frozen until ice ball extended beyond lesion: Yes   Outcome: patient tolerated procedure well with no complications   Post-procedure details: wound care instructions given    Related Medications mupirocin ointment (BACTROBAN) 2 % Apply to any open sores daily.  Inflamed seborrheic keratosis left cheek zygoma x 1,  Destruction of lesion - left cheek zygoma x 1, Complexity: simple   Destruction method:  cryotherapy   Informed consent: discussed and consent obtained   Timeout:  patient name, date of birth, surgical site, and procedure verified Lesion destroyed using liquid nitrogen: Yes   Region frozen until ice ball extended beyond lesion: Yes   Outcome: patient tolerated procedure well with no  complications   Post-procedure details: wound care instructions given    History of squamous cell carcinoma in situ (SCCIS) of skin left medial cheek  Clear. Observe for recurrence. Call clinic for new or changing lesions.  Recommend regular skin exams, daily broad-spectrum spf 30+ sunscreen use, and photoprotection.     Squamous cell carcinoma in situ (SCCIS) - some persistent/recurrent Right med dorsum hand  Destruction of lesion 1.5 cm Complexity: simple   Destruction method: cryotherapy   Informed consent: discussed and consent obtained   Timeout:  patient name, date of birth, surgical site, and procedure verified Lesion destroyed using liquid nitrogen: Yes   Region frozen until ice ball extended beyond lesion: Yes   Outcome: patient tolerated procedure well with no complications   Post-procedure details: wound care instructions given    Actinic Damage - chronic, secondary to cumulative UV radiation exposure/sun exposure over time - diffuse scaly erythematous macules with underlying dyspigmentation - Recommend daily broad spectrum sunscreen SPF 30+ to sun-exposed areas, reapply every 2 hours as needed.  - Recommend staying in the shade or wearing long sleeves, sun glasses (UVA+UVB protection) and wide brim hats (4-inch brim around the entire circumference of the hat). - Call for new or changing lesions.  Return in about 4 months (around 01/06/2021) for SCCis, AKs, ISKs .  IMarye Round, CMA, am acting as scribe for Sarina Ser, MD .  Documentation: I have reviewed the above documentation for accuracy and completeness, and I agree with the above.  Sarina Ser, MD

## 2020-09-06 NOTE — Patient Instructions (Addendum)
If you have any questions or concerns for your doctor, please call our main line at [phone] and press option 4 to reach your doctor's medical assistant. If no one answers, please leave a voicemail as directed and we will return your call as soon as possible. Messages left after 4 pm will be answered the following business day.   You may also send [place] a message via [place]. We typically respond to MyChart messages within 1-2 business days.  For prescription refills, please ask your pharmacy to contact our office. Our fax number is [phone].  If you have an urgent issue when the clinic is closed that cannot wait until the next business day, you can page your doctor at the number below.    Please note that while we do our best to be available for urgent issues outside of office hours, we are not available 24/7.   If you have an urgent issue and are unable to reach [place], you may choose to seek medical care at your doctor's office, retail clinic, urgent care center, or emergency room.  If you have a medical emergency, please immediately call 911 or go to the emergency department.  Pager Numbers  - Dr. Nehemiah Massed: [phone]  - Dr. Laurence Ferrari: [phone]  - Dr. Nicole Kindred: [phone]  In the event of inclement weather, please call our main line at [phone] for an update on the status of any delays or closures.  Dermatology Medication Tips: Please keep the boxes that topical medications come in in order to help keep track of the instructions about where and how to use these. Pharmacies typically print the medication instructions only on the boxes and not directly on the medication tubes.   If your medication is too expensive, please contact our office at [phone] option 4 or send [place] a message through [place].   We are unable to tell what your co-pay for medications will be in advance as this is different depending on your insurance coverage. However, we may be able to find a substitute  medication at lower cost or fill out paperwork to get insurance to cover a needed medication.   If a prior authorization is required to get your medication covered by your insurance company, please allow [place] 1-2 business days to complete this process.  Drug prices often vary depending on where the prescription is filled and some pharmacies may offer cheaper prices.  The website www.goodrx.com contains coupons for medications through different pharmacies. The prices here do not account for what the cost may be with help from insurance (it may be cheaper with your insurance), but the website can give you the price if you did not use any insurance.  - You can print the associated coupon and take it with your prescription to the pharmacy.  - You may also stop by our office during regular business hours and pick up a GoodRx coupon card.  - If you need your prescription sent electronically to a different pharmacy, notify our office through [facility] or by phone at [phone] option 4.  Wound Care Instructions  1. Cleanse wound gently with soap and water once a day then pat dry with clean gauze. Apply a thing coat of Petrolatum (petroleum jelly, "Vaseline") over the wound (unless you have an allergy to this). We recommend that you use a new, sterile tube of Vaseline. Do not pick or remove scabs. Do not remove the yellow or white "healing tissue" from the base of the wound.  2. Cover  Cover the wound with fresh, clean, nonstick gauze and secure with paper tape. You may use Band-Aids in place of gauze and tape if the would is small enough, but would recommend trimming much of the tape off as there is often too much. Sometimes Band-Aids can irritate the skin.  3. You should call the office for your biopsy report after 1 week if you have not already been contacted.  4. If you experience any problems, such as abnormal amounts of bleeding, swelling, significant bruising, significant pain, or evidence of  infection, please call the office immediately.  5. FOR ADULT SURGERY PATIENTS: If you need something for pain relief you may take 1 extra strength Tylenol (acetaminophen) AND 2 Ibuprofen (200mg each) together every 4 hours as needed for pain. (do not take these if you are allergic to them or if you have a reason you should not take them.) Typically, you may only need pain medication for 1 to 3 days.     Cryotherapy Aftercare  . Wash gently with soap and water everyday.   . Apply Vaseline and Band-Aid daily until healed.  

## 2020-09-07 ENCOUNTER — Telehealth: Payer: Self-pay | Admitting: Oncology

## 2020-09-07 ENCOUNTER — Inpatient Hospital Stay: Payer: Medicare Other | Attending: Hematology and Oncology | Admitting: Oncology

## 2020-09-07 ENCOUNTER — Inpatient Hospital Stay: Payer: Medicare Other

## 2020-09-07 VITALS — BP 129/81 | HR 71 | Temp 98.8°F | Resp 17

## 2020-09-07 DIAGNOSIS — N1832 Chronic kidney disease, stage 3b: Secondary | ICD-10-CM | POA: Insufficient documentation

## 2020-09-07 DIAGNOSIS — D7581 Myelofibrosis: Secondary | ICD-10-CM | POA: Insufficient documentation

## 2020-09-07 DIAGNOSIS — Z803 Family history of malignant neoplasm of breast: Secondary | ICD-10-CM | POA: Insufficient documentation

## 2020-09-07 DIAGNOSIS — R5383 Other fatigue: Secondary | ICD-10-CM

## 2020-09-07 DIAGNOSIS — Z87891 Personal history of nicotine dependence: Secondary | ICD-10-CM | POA: Diagnosis not present

## 2020-09-07 DIAGNOSIS — Z79899 Other long term (current) drug therapy: Secondary | ICD-10-CM | POA: Diagnosis not present

## 2020-09-07 DIAGNOSIS — Z5189 Encounter for other specified aftercare: Secondary | ICD-10-CM

## 2020-09-07 DIAGNOSIS — F0390 Unspecified dementia without behavioral disturbance: Secondary | ICD-10-CM | POA: Insufficient documentation

## 2020-09-07 DIAGNOSIS — N289 Disorder of kidney and ureter, unspecified: Secondary | ICD-10-CM | POA: Diagnosis not present

## 2020-09-07 DIAGNOSIS — Z8249 Family history of ischemic heart disease and other diseases of the circulatory system: Secondary | ICD-10-CM | POA: Diagnosis not present

## 2020-09-07 DIAGNOSIS — C44329 Squamous cell carcinoma of skin of other parts of face: Secondary | ICD-10-CM | POA: Insufficient documentation

## 2020-09-07 DIAGNOSIS — D649 Anemia, unspecified: Secondary | ICD-10-CM

## 2020-09-07 DIAGNOSIS — Z818 Family history of other mental and behavioral disorders: Secondary | ICD-10-CM | POA: Insufficient documentation

## 2020-09-07 DIAGNOSIS — E860 Dehydration: Secondary | ICD-10-CM

## 2020-09-07 DIAGNOSIS — D45 Polycythemia vera: Secondary | ICD-10-CM | POA: Diagnosis not present

## 2020-09-07 DIAGNOSIS — D631 Anemia in chronic kidney disease: Secondary | ICD-10-CM | POA: Diagnosis not present

## 2020-09-07 LAB — COMPREHENSIVE METABOLIC PANEL
ALT: 14 U/L (ref 0–44)
AST: 20 U/L (ref 15–41)
Albumin: 4.1 g/dL (ref 3.5–5.0)
Alkaline Phosphatase: 74 U/L (ref 38–126)
Anion gap: 11 (ref 5–15)
BUN: 30 mg/dL — ABNORMAL HIGH (ref 8–23)
CO2: 24 mmol/L (ref 22–32)
Calcium: 8.9 mg/dL (ref 8.9–10.3)
Chloride: 103 mmol/L (ref 98–111)
Creatinine, Ser: 1.14 mg/dL — ABNORMAL HIGH (ref 0.44–1.00)
GFR, Estimated: 45 mL/min — ABNORMAL LOW (ref >60)
Glucose, Bld: 137 mg/dL — ABNORMAL HIGH (ref 70–99)
Potassium: 4.2 mmol/L (ref 3.5–5.1)
Sodium: 138 mmol/L (ref 135–145)
Total Bilirubin: 0.3 mg/dL (ref 0.3–1.2)
Total Protein: 6.8 g/dL (ref 6.5–8.1)

## 2020-09-07 LAB — CBC WITH DIFFERENTIAL/PLATELET
Abs Immature Granulocytes: 0.41 10*3/uL — ABNORMAL HIGH (ref 0.00–0.07)
Basophils Absolute: 0.2 10*3/uL — ABNORMAL HIGH (ref 0.0–0.1)
Basophils Relative: 2 %
Eosinophils Absolute: 0.4 10*3/uL (ref 0.0–0.5)
Eosinophils Relative: 5 %
HCT: 31.5 % — ABNORMAL LOW (ref 36.0–46.0)
Hemoglobin: 9.9 g/dL — ABNORMAL LOW (ref 12.0–15.0)
Immature Granulocytes: 5 %
Lymphocytes Relative: 20 %
Lymphs Abs: 1.6 10*3/uL (ref 0.7–4.0)
MCH: 29.6 pg (ref 26.0–34.0)
MCHC: 31.4 g/dL (ref 30.0–36.0)
MCV: 94.3 fL (ref 80.0–100.0)
Monocytes Absolute: 0.9 10*3/uL (ref 0.1–1.0)
Monocytes Relative: 11 %
Neutro Abs: 4.6 10*3/uL (ref 1.7–7.7)
Neutrophils Relative %: 57 %
Platelets: 525 10*3/uL — ABNORMAL HIGH (ref 150–400)
RBC: 3.34 MIL/uL — ABNORMAL LOW (ref 3.87–5.11)
RDW: 22.6 % — ABNORMAL HIGH (ref 11.5–15.5)
WBC: 8 10*3/uL (ref 4.0–10.5)
nRBC: 0.3 % — ABNORMAL HIGH (ref 0.0–0.2)

## 2020-09-07 LAB — VITAMIN B12: Vitamin B-12: 304 pg/mL (ref 180–914)

## 2020-09-07 MED ORDER — SODIUM CHLORIDE 0.9 % IV SOLN
Freq: Once | INTRAVENOUS | Status: AC
Start: 2020-09-07 — End: 2020-09-07
  Filled 2020-09-07: qty 250

## 2020-09-07 NOTE — Telephone Encounter (Signed)
Pt daughter left a VM voicing concerns about her Mother's dehydration. Would like to schedule an appt for IVF. Wants a call back ASAP

## 2020-09-07 NOTE — Progress Notes (Signed)
Receiving 500 ml over 2 hours for dehydration. Pt  has dementia, needs constant encouragement to eat and drink. VSS. Discharged to home.

## 2020-09-07 NOTE — Telephone Encounter (Addendum)
Per daughter, patient is lethargic, not cooperative sleeping a lot. Trying to get her to drink fluids up to 7 glasses per day. Asking for her to come in before weekend for IV fluids as she has been getting fluid twice a week before being exposed to [place]. She does not want to have to take her to ER over weekend. She also would like her to have labs drawn. She is not on IV chemotherapy, she is on Jakafi. She has Poly vera Please advise

## 2020-09-07 NOTE — Telephone Encounter (Signed)
Patient to come in at 130 for labs then get IV fluids

## 2020-09-07 NOTE — Patient Instructions (Signed)
Dehydration, Adult Dehydration is condition in which there is not enough water or other fluids in the body. This happens when a person loses more fluids than he or she takes in. Important body parts cannot work right without the right amount of fluids. Any loss of fluids from the body can cause dehydration. Dehydration can be mild, worse, or very bad. It should be treated right away to keep it from getting very bad. What are the causes? This condition may be caused by:  Conditions that cause loss of water or other fluids, such as: ? Watery poop (diarrhea). ? Vomiting. ? Sweating a lot. ? Peeing (urinating) a lot.  Not drinking enough fluids, especially when you: ? Are ill. ? Are doing things that take a lot of energy to do.  Other illnesses and conditions, such as fever or infection.  Certain medicines, such as medicines that take extra fluid out of the body (diuretics).  Lack of safe drinking water.  Not being able to get enough water and food. What increases the risk? The following factors may make you more likely to develop this condition:  Having a long-term (chronic) illness that has not been treated the right way, such as: ? Diabetes. ? Heart disease. ? Kidney disease.  Being 65 years of age or older.  Having a disability.  Living in a place that is high above the ground or sea (high in altitude). The thinner, dried air causes more fluid loss.  Doing exercises that put stress on your body for a long time. What are the signs or symptoms? Symptoms of dehydration depend on how bad it is. Mild or worse dehydration  Thirst.  Dry lips or dry mouth.  Feeling dizzy or light-headed, especially when you stand up from sitting.  Muscle cramps.  Your body making: ? Dark pee (urine). Pee may be the color of tea. ? Less pee than normal. ? Less tears than normal.  Headache. Very bad dehydration  Changes in skin. Skin may: ? Be cold to the touch (clammy). ? Be blotchy  or pale. ? Not go back to normal right after you lightly pinch it and let it go.  Little or no tears, pee, or sweat.  Changes in vital signs, such as: ? Fast breathing. ? Low blood pressure. ? Weak pulse. ? Pulse that is more than 100 beats a minute when you are sitting still.  Other changes, such as: ? Feeling very thirsty. ? Eyes that look hollow (sunken). ? Cold hands and feet. ? Being mixed up (confused). ? Being very tired (lethargic) or having trouble waking from sleep. ? Short-term weight loss. ? Loss of consciousness. How is this treated? Treatment for this condition depends on how bad it is. Treatment should start right away. Do not wait until your condition gets very bad. Very bad dehydration is an emergency. You will need to go to a hospital.  Mild or worse dehydration can be treated at home. You may be asked to: ? Drink more fluids. ? Drink an oral rehydration solution (ORS). This drink helps get the right amounts of fluids and salts and minerals in the blood (electrolytes).  Very bad dehydration can be treated: ? With fluids through an IV tube. ? By getting normal levels of salts and minerals in your blood. This is often done by giving salts and minerals through a tube. The tube is passed through your nose and into your stomach. ? By treating the root cause. Follow these instructions at   home: Oral rehydration solution If told by your doctor, drink an ORS:  Make an ORS. Use instructions on the package.  Start by drinking small amounts, about  cup (120 mL) every 5-10 minutes.  Slowly drink more until you have had the amount that your doctor said to have. Eating and drinking  Drink enough clear fluid to keep your pee pale yellow. If you were told to drink an ORS, finish the ORS first. Then, start slowly drinking other clear fluids. Drink fluids such as: ? Water. Do not drink only water. Doing that can make the salt (sodium) level in your body get too low. ? Water  from ice chips you suck on. ? Fruit juice that you have added water to (diluted). ? Low-calorie sports drinks.  Eat foods that have the right amounts of salts and minerals, such as: ? Bananas. ? Oranges. ? Potatoes. ? Tomatoes. ? Spinach.  Do not drink alcohol.  Avoid: ? Drinks that have a lot of sugar. These include:  High-calorie sports drinks.  Fruit juice that you did not add water to.  Soda.  Caffeine. ? Foods that are greasy or have a lot of fat or sugar.         General instructions  Take over-the-counter and prescription medicines only as told by your doctor.  Do not take salt tablets. Doing that can make the salt level in your body get too high.  Return to your normal activities as told by your doctor. Ask your doctor what activities are safe for you.  Keep all follow-up visits as told by your doctor. This is important. Contact a doctor if:  You have pain in your belly (abdomen) and the pain: ? Gets worse. ? Stays in one place.  You have a rash.  You have a stiff neck.  You get angry or annoyed (irritable) more easily than normal.  You are more tired or have a harder time waking than normal.  You feel: ? Weak or dizzy. ? Very thirsty. Get help right away if you have:  Any symptoms of very bad dehydration.  Symptoms of vomiting, such as: ? You cannot eat or drink without vomiting. ? Your vomiting gets worse or does not go away. ? Your vomit has blood or green stuff in it.  Symptoms that get worse with treatment.  A fever.  A very bad headache.  Problems with peeing or pooping (having a bowel movement), such as: ? Watery poop that gets worse or does not go away. ? Blood in your poop (stool). This may cause poop to look black and tarry. ? Not peeing in 6-8 hours. ? Peeing only a small amount of very dark pee in 6-8 hours.  Trouble breathing. These symptoms may be an emergency. Do not wait to see if the symptoms will go away. Get  medical help right away. Call your local emergency services (911 in the [place] Do not drive yourself to the hospital. Summary  Dehydration is a condition in which there is not enough water or other fluids in the body. This happens when a person loses more fluids than he or she takes in.  Treatment for this condition depends on how bad it is. Treatment should be started right away. Do not wait until your condition gets very bad.  Drink enough clear fluid to keep your pee pale yellow. If you were told to drink an oral rehydration solution (ORS), finish the ORS first. Then, start slowly drinking other clear fluids.    Take over-the-counter and prescription medicines only as told by your doctor.  Get help right away if you have any symptoms of very bad dehydration. This information is not intended to replace advice given to you by your health care provider. Make sure you discuss any questions you have with your health care provider. Document Revised: 10/29/2018 Document Reviewed: 10/29/2018 Elsevier Patient Education  2021 Elsevier Inc.  

## 2020-09-08 ENCOUNTER — Encounter: Payer: Self-pay | Admitting: Dermatology

## 2020-09-12 ENCOUNTER — Inpatient Hospital Stay (HOSPITAL_BASED_OUTPATIENT_CLINIC_OR_DEPARTMENT_OTHER): Payer: Medicare Other | Admitting: Oncology

## 2020-09-12 ENCOUNTER — Telehealth: Payer: Self-pay

## 2020-09-12 ENCOUNTER — Inpatient Hospital Stay: Payer: Medicare Other

## 2020-09-12 ENCOUNTER — Other Ambulatory Visit (HOSPITAL_COMMUNITY): Payer: Self-pay

## 2020-09-12 ENCOUNTER — Other Ambulatory Visit: Payer: Self-pay

## 2020-09-12 VITALS — BP 141/61 | HR 86 | Temp 97.7°F | Resp 18 | Wt 112.4 lb

## 2020-09-12 DIAGNOSIS — D631 Anemia in chronic kidney disease: Secondary | ICD-10-CM

## 2020-09-12 DIAGNOSIS — N1832 Chronic kidney disease, stage 3b: Secondary | ICD-10-CM | POA: Diagnosis not present

## 2020-09-12 DIAGNOSIS — R634 Abnormal weight loss: Secondary | ICD-10-CM | POA: Diagnosis not present

## 2020-09-12 DIAGNOSIS — R4189 Other symptoms and signs involving cognitive functions and awareness: Secondary | ICD-10-CM | POA: Diagnosis not present

## 2020-09-12 DIAGNOSIS — N2581 Secondary hyperparathyroidism of renal origin: Secondary | ICD-10-CM

## 2020-09-12 DIAGNOSIS — E860 Dehydration: Secondary | ICD-10-CM

## 2020-09-12 DIAGNOSIS — Z5189 Encounter for other specified aftercare: Secondary | ICD-10-CM | POA: Diagnosis not present

## 2020-09-12 DIAGNOSIS — D7581 Myelofibrosis: Secondary | ICD-10-CM | POA: Diagnosis not present

## 2020-09-12 DIAGNOSIS — R5383 Other fatigue: Secondary | ICD-10-CM

## 2020-09-12 DIAGNOSIS — D45 Polycythemia vera: Secondary | ICD-10-CM

## 2020-09-12 DIAGNOSIS — N289 Disorder of kidney and ureter, unspecified: Secondary | ICD-10-CM

## 2020-09-12 DIAGNOSIS — G3184 Mild cognitive impairment, so stated: Secondary | ICD-10-CM

## 2020-09-12 DIAGNOSIS — D649 Anemia, unspecified: Secondary | ICD-10-CM

## 2020-09-12 DIAGNOSIS — R6889 Other general symptoms and signs: Secondary | ICD-10-CM

## 2020-09-12 DIAGNOSIS — D61818 Other pancytopenia: Secondary | ICD-10-CM | POA: Diagnosis not present

## 2020-09-12 DIAGNOSIS — Z0000 Encounter for general adult medical examination without abnormal findings: Secondary | ICD-10-CM

## 2020-09-12 DIAGNOSIS — C44329 Squamous cell carcinoma of skin of other parts of face: Secondary | ICD-10-CM | POA: Diagnosis not present

## 2020-09-12 LAB — CBC WITH DIFFERENTIAL/PLATELET
Abs Immature Granulocytes: 0.48 10*3/uL — ABNORMAL HIGH (ref 0.00–0.07)
Basophils Absolute: 0.1 10*3/uL (ref 0.0–0.1)
Basophils Relative: 1 %
Eosinophils Absolute: 0.4 10*3/uL (ref 0.0–0.5)
Eosinophils Relative: 4 %
HCT: 33.2 % — ABNORMAL LOW (ref 36.0–46.0)
Hemoglobin: 10.4 g/dL — ABNORMAL LOW (ref 12.0–15.0)
Immature Granulocytes: 5 %
Lymphocytes Relative: 17 %
Lymphs Abs: 1.6 10*3/uL (ref 0.7–4.0)
MCH: 28.9 pg (ref 26.0–34.0)
MCHC: 31.3 g/dL (ref 30.0–36.0)
MCV: 92.2 fL (ref 80.0–100.0)
Monocytes Absolute: 0.8 10*3/uL (ref 0.1–1.0)
Monocytes Relative: 8 %
Neutro Abs: 6.2 10*3/uL (ref 1.7–7.7)
Neutrophils Relative %: 65 %
Platelets: 568 10*3/uL — ABNORMAL HIGH (ref 150–400)
RBC: 3.6 MIL/uL — ABNORMAL LOW (ref 3.87–5.11)
RDW: 22.7 % — ABNORMAL HIGH (ref 11.5–15.5)
WBC: 9.6 10*3/uL (ref 4.0–10.5)
nRBC: 0.2 % (ref 0.0–0.2)

## 2020-09-12 LAB — COMPREHENSIVE METABOLIC PANEL
ALT: 15 U/L (ref 0–44)
AST: 22 U/L (ref 15–41)
Albumin: 4.4 g/dL (ref 3.5–5.0)
Alkaline Phosphatase: 77 U/L (ref 38–126)
Anion gap: 8 (ref 5–15)
BUN: 33 mg/dL — ABNORMAL HIGH (ref 8–23)
CO2: 25 mmol/L (ref 22–32)
Calcium: 9.2 mg/dL (ref 8.9–10.3)
Chloride: 101 mmol/L (ref 98–111)
Creatinine, Ser: 1.2 mg/dL — ABNORMAL HIGH (ref 0.44–1.00)
GFR, Estimated: 43 mL/min — ABNORMAL LOW (ref >60)
Glucose, Bld: 142 mg/dL — ABNORMAL HIGH (ref 70–99)
Potassium: 4.3 mmol/L (ref 3.5–5.1)
Sodium: 134 mmol/L — ABNORMAL LOW (ref 135–145)
Total Bilirubin: 0.3 mg/dL (ref 0.3–1.2)
Total Protein: 7 g/dL (ref 6.5–8.1)

## 2020-09-12 MED ORDER — EPOETIN ALFA-EPBX 10000 UNIT/ML IJ SOLN
10000.0000 [IU] | Freq: Once | INTRAMUSCULAR | Status: AC
  Administered 2020-09-12: 10000 [IU] via SUBCUTANEOUS

## 2020-09-12 NOTE — Telephone Encounter (Signed)
-----   Message from Ralene Bathe, MD sent at 09/12/2020 11:10 AM EDT ----- Diagnosis Skin , right thumb SQUAMOUS CELL CARCINOMA IN SITU, HYPERTROPHIC, BASE INVOLVED  Cancer - SCC in situ Superficial Already treated Recheck next visit

## 2020-09-12 NOTE — Progress Notes (Signed)
[facility]  [address] [place], [place] [address] Phone: [phone]  Fax: [phone]   Clinic Day:  09/12/2020   Referring physician: Glean Hess, MD  Chief Complaint: Laura Wilcox is a 87 y.o. female with polycythemia rubra vera (PV) on Jakafi who is seen for 1 month assessment.   HPI: The patient was last seen in the hematology clinic on 08/09/2020.    She presents today with her daughter.  She has been receiving twice a week IV fluids for dehydration.  Her nephrologist has recommended frequent IV fluids given her dementia.  She has been compliant with her Jakafi.  Daughter reports her missing maybe 1 dose over the past 2 weeks.    Her appetite and weight are stable. .  Patient's daughter called clinic this week concerned that her mother had not been receiving IV fluids per previous discussion with Dr. Mike Gip.  She would like to resume twice a week IV fluids.  She has been receiving weekly Retacrit 10,000 units last given on 08/09/2020.   Daughter reports that her her dementia is stable.  Past Medical History:  Diagnosis Date   Confusion 12/30/2018   Polycythemia    Squamous cell carcinoma of skin 09/06/2020   R thumb - ED&C   Urinary tract infection without hematuria 01/02/2020   Urticaria 06/02/2017    Past Surgical History:  Procedure Laterality Date   ABDOMINAL HYSTERECTOMY     complete   BREAST SURGERY      Family History  Problem Relation Age of Onset   Heart disease Father    Cancer Sister        breast   Cancer Sister        breast   Alzheimer's disease Sister     Social History:  reports that she quit smoking about 42 years ago. She has a 30.00 pack-year smoking history. She has never used smokeless tobacco. She reports that she does not drink alcohol and does not use drugs. She is widowed.  Patient is retired Sport and exercise psychologist. Patient moved to [facility] in 03/2017.  She lives independently in her own apartment in  [place]. Her daughter lives next door. Patient denies known exposures to radiation on toxins.  Pt's daughter, Laura Wilcox, can be reached at [phone].  She lives in [place].  The patient is accompanied by her daughter today.   Allergies: No Known Allergies  Current Medications: Current Outpatient Medications  Medication Sig Dispense Refill   acetaminophen (TYLENOL) 325 MG tablet Take 650 mg by mouth every 6 (six) hours as needed.     calcitRIOL (ROCALTROL) 0.25 MCG capsule Take 0.25 mcg by mouth 3 (three) times a week.      mupirocin ointment (BACTROBAN) 2 % Apply to any open sores daily. 22 g 0   ruxolitinib phosphate (JAKAFI) 10 MG tablet TAKE 1 TABLET (10 MG TOTAL) BY MOUTH 2 (TWO) TIMES DAILY. 60 tablet 1   No current facility-administered medications for this visit.    Review of Systems  Constitutional: Negative.  Negative for chills, fever, malaise/fatigue and weight loss.  HENT:  Negative for congestion, ear pain and tinnitus.   Eyes: Negative.  Negative for blurred vision and double vision.  Respiratory: Negative.  Negative for cough, sputum production and shortness of breath.   Cardiovascular: Negative.  Negative for chest pain, palpitations and leg swelling.  Gastrointestinal: Negative.  Negative for abdominal pain, constipation, diarrhea, nausea and vomiting.  Genitourinary:  Negative for dysuria, frequency and urgency.  Musculoskeletal:  Negative for back pain and falls.  Skin: Negative.  Negative for rash.  Neurological: Negative.  Negative for weakness and headaches.  Endo/Heme/Allergies: Negative.  Does not bruise/bleed easily.  Psychiatric/Behavioral: Negative.  Negative for depression. The patient is not nervous/anxious and does not have insomnia.    Performance status (ECOG):  2-3  Vitals Blood pressure (!) 141/61, pulse 86, temperature 97.7 F (36.5 C), temperature source Tympanic, resp. rate 18, weight 112 lb 7 oz (51 kg).    Physical Exam Vitals and nursing note  reviewed.  HENT:     Head: Normocephalic and atraumatic.     Nose: No congestion.     Mouth/Throat:     Mouth: Mucous membranes are moist.     Pharynx: Oropharynx is clear.  Eyes:     General:        Right eye: No discharge.        Left eye: No discharge.     Extraocular Movements: Extraocular movements intact.     Pupils: Pupils are equal, round, and reactive to light.  Cardiovascular:     Rate and Rhythm: Normal rate and regular rhythm.     Pulses: Normal pulses.     Heart sounds: No murmur heard. Pulmonary:     Effort: Pulmonary effort is normal.  Abdominal:     General: Bowel sounds are normal. There is no distension.     Tenderness: There is no abdominal tenderness. There is no guarding.  Musculoskeletal:        General: No swelling or deformity.     Right lower leg: No edema.     Left lower leg: No edema.  Skin:    General: Skin is warm and dry.     Capillary Refill: Capillary refill takes less than 2 seconds.  Neurological:     Mental Status: She is alert and oriented to person, place, and time.     Motor: No weakness.     Gait: Gait normal.  Psychiatric:        Attention and Perception: Attention normal.        Mood and Affect: Mood is anxious.        Cognition and Memory: Cognition is impaired. Memory is impaired.    Appointment on 09/12/2020  Component Date Value Ref Range Status   Sodium 09/12/2020 134 (A) 135 - 145 mmol/L Final   Potassium 09/12/2020 4.3  3.5 - 5.1 mmol/L Final   Chloride 09/12/2020 101  98 - 111 mmol/L Final   CO2 09/12/2020 25  22 - 32 mmol/L Final   Glucose, Bld 09/12/2020 142 (A) 70 - 99 mg/dL Final   Glucose reference range applies only to samples taken after fasting for at least 8 hours.   BUN 09/12/2020 33 (A) 8 - 23 mg/dL Final   Creatinine, Ser 09/12/2020 1.20 (A) 0.44 - 1.00 mg/dL Final   Calcium 09/12/2020 9.2  8.9 - 10.3 mg/dL Final   Total Protein 09/12/2020 7.0  6.5 - 8.1 g/dL Final   Albumin 09/12/2020 4.4  3.5 - 5.0 g/dL  Final   AST 09/12/2020 22  15 - 41 U/L Final   ALT 09/12/2020 15  0 - 44 U/L Final   Alkaline Phosphatase 09/12/2020 77  38 - 126 U/L Final   Total Bilirubin 09/12/2020 0.3  0.3 - 1.2 mg/dL Final   GFR, Estimated 09/12/2020 43 (A) >60 mL/min Final   Comment: (NOTE) Calculated using the CKD-EPI Creatinine Equation (2021)    Anion gap  09/12/2020 8  5 - 15 Final   Performed at [facility] [address] [place], [place] [address]   CMP     Component Value Date/Time   NA 134 (L) 09/12/2020 1358   K 4.3 09/12/2020 1358   CL 101 09/12/2020 1358   CO2 25 09/12/2020 1358   GLUCOSE 142 (H) 09/12/2020 1358   BUN 33 (H) 09/12/2020 1358   CREATININE 1.20 (H) 09/12/2020 1358   CALCIUM 9.2 09/12/2020 1358   PROT 7.0 09/12/2020 1358   ALBUMIN 4.4 09/12/2020 1358   AST 22 09/12/2020 1358   ALT 15 09/12/2020 1358   ALKPHOS 77 09/12/2020 1358   BILITOT 0.3 09/12/2020 1358   GFRNONAA 43 (L) 09/12/2020 1358   GFRAA 49 (L) 12/27/2019 0935    Assessment:  Laura Wilcox is a 87 y.o. female with polycythemia rubra vera and secondary myelofibrosis.  She presented in 04/2002 with thrombocytosis (835,000).  JAK2 V617F was positive on 05/20/2012.     She developed pancytopenia in 12/2015.  Etiology is unclear (medication confusion or hydroxyurea + Agrylin).   She received Procrit 40,000 units (intermittently 09/2012 - 06/2016), Venofer (09/2012 - 10/2012), and Infed (12/2015 - 03/2016).  She received Neupogen during a period of neutropenia. She was anagrelide from 2004 - 08/13/2017.  She began [place] on 08/14/2017.  Jakafi was increased from 5 mg BID to 10 mg BID on 06/17/2018.  Jakafi was increased to 15 mg BID on 08/03/2018.  She has been back on Jakafi 5 mg BID since 02/01/2019 then 10 mg BID on 05/25/2019.  [name] was on hold from 12/20/2019 - 01/12/2020.  Jakafi 5 mg twice daily restarted on 01/12/2020 then increased to 10 mg in the morning and 5 mg in the evening on 02/02/2020.  [name] is currently 20 mg po BID.  She has received Retacrit to maintain a hemoglobin > 10 (12/24/2019 and 06/26/2020).   She began a phlebotomy program on 08/06/2017.  She undergoes small volume (150 cc with replacement of 150 cc NS) if her HCT > 42.   Ferritin has been followed: 529 on 09/03/2017, 1020 on 12/03/2017, 423 on 02/18/2018, 858 on 11/17/2018, 785 on 09/23/2019 and 728 on 12/20/2019.  Iron saturation was 37% and TIBC 270 on 11/17/2018.  B12 was 477 and folate 36 on 11/17/2018.  B12 was 292 and folate 19.1 on 12/20/2019   The patient has received both doses of the COVID-19 vaccine.  Symptomatically, she continues to struggle with fluid intake.  Dad reports better compliance with Barnabas Lister Fe over the past 2 weeks.  Fluid intake has been minimal.  Plan: Polycythemia rubra vera Labs from 08/09/2020 showed platelet count of 568,000. She continues to not be in goal secondary although she has been more compliant with her medication. Her platelet goal is less than 400,000. Recommend increasing Jakafi to 3 tablets daily (30 mg) from previous 2 tablets daily (20 mg daily).   Anemia: Hemoglobin is 10.4 today. Proceed with Retacrit 10,000 units.  Renal insufficiency Renal function is stable. Requires daily encouragement to drink fluids. Patient's daughter would like for her to be scheduled for twice weekly IV fluids. Patient may benefit from home health IV fluids.  Dementia She has severe underlying dementia. She has declined neurology consultation on multiple occasions.  Squamous cell carcinoma left cheek She is s/p biopsy in 10/2017 by Dr Phillip Heal. She was seen by Dr. Lacinda Axon at [facility] on 04/11/2020.  She was noted to have a 3.5-4 cm keratotic plaque with  abundant scale c/w squamous cell carcinoma in situ. Treatment options were discussed (Moh's, radiation, topicals). She underwent cryotherapy by Dr Nehemiah Massed on 05/25/2020. She underwent debridement on 03/03/202  Recurrent  dehydration Renal function is stable today. Return to clinic twice weekly until we are able to get home health services set up.  Disposition  Retacrit today. Increase Jakafi from 20 mg to 30 mg daily to see if this improves her platelets. RTC on Tuesday and Friday x 3 weeks for IVF. Patient would like afternoon appointments.  RTC in 1 month for MD assessment, labs (CBC with diff, CMP, B12) and +/- Retacrit.  Greater than 50% was spent in counseling and coordination of care with this patient including but not limited to discussion of the relevant topics above (See A&P) including, but not limited to diagnosis and management of acute and chronic medical conditions.   Faythe Casa, NP 09/12/2020 7:55 PM

## 2020-09-12 NOTE — Telephone Encounter (Signed)
Patient's daughter Tricia informed of pathology results.  °

## 2020-09-13 ENCOUNTER — Other Ambulatory Visit: Payer: Self-pay

## 2020-09-13 ENCOUNTER — Inpatient Hospital Stay: Payer: Medicare Other

## 2020-09-13 VITALS — BP 123/64 | HR 89 | Temp 97.4°F | Resp 18

## 2020-09-13 DIAGNOSIS — N289 Disorder of kidney and ureter, unspecified: Secondary | ICD-10-CM | POA: Diagnosis not present

## 2020-09-13 DIAGNOSIS — C44329 Squamous cell carcinoma of skin of other parts of face: Secondary | ICD-10-CM | POA: Diagnosis not present

## 2020-09-13 DIAGNOSIS — D45 Polycythemia vera: Secondary | ICD-10-CM | POA: Diagnosis not present

## 2020-09-13 DIAGNOSIS — E860 Dehydration: Secondary | ICD-10-CM

## 2020-09-13 DIAGNOSIS — D7581 Myelofibrosis: Secondary | ICD-10-CM | POA: Diagnosis not present

## 2020-09-13 DIAGNOSIS — D649 Anemia, unspecified: Secondary | ICD-10-CM | POA: Diagnosis not present

## 2020-09-13 MED ORDER — SODIUM CHLORIDE 0.9 % IV SOLN
Freq: Once | INTRAVENOUS | Status: AC
Start: 2020-09-13 — End: 2020-09-13
  Filled 2020-09-13: qty 250

## 2020-09-13 NOTE — Patient Instructions (Signed)
[facility]  Discharge Instructions: Thank you for choosing [facility] to provide your oncology and hematology care.  If you have a lab appointment with the Cancer Center, please go directly to the Cancer Center and check in at the registration area.  Wear comfortable clothing and clothing appropriate for easy access to any Portacath or PICC line.   We strive to give you quality time with your provider. You may need to reschedule your appointment if you arrive late (15 or more minutes).  Arriving late affects you and other patients whose appointments are after yours.  Also, if you miss three or more appointments without notifying the office, you may be dismissed from the clinic at the provider's discretion.      For prescription refill requests, have your pharmacy contact our office and allow 72 hours for refills to be completed.    Today you received the following chemotherapy and/or immunotherapy agents       To help prevent nausea and vomiting after your treatment, we encourage you to take your nausea medication as directed.  BELOW ARE SYMPTOMS THAT SHOULD BE REPORTED IMMEDIATELY: *FEVER GREATER THAN 100.4 F (38 C) OR HIGHER *CHILLS OR SWEATING *NAUSEA AND VOMITING THAT IS NOT CONTROLLED WITH YOUR NAUSEA MEDICATION *UNUSUAL SHORTNESS OF BREATH *UNUSUAL BRUISING OR BLEEDING *URINARY PROBLEMS (pain or burning when urinating, or frequent urination) *BOWEL PROBLEMS (unusual diarrhea, constipation, pain near the anus) TENDERNESS IN MOUTH AND THROAT WITH OR WITHOUT PRESENCE OF ULCERS (sore throat, sores in mouth, or a toothache) UNUSUAL RASH, SWELLING OR PAIN  UNUSUAL VAGINAL DISCHARGE OR ITCHING   Items with * indicate a potential emergency and should be followed up as soon as possible or go to the Emergency Department if any problems should occur.  Please show the CHEMOTHERAPY ALERT CARD or IMMUNOTHERAPY ALERT CARD at check-in to the Emergency  Department and triage nurse.  Should you have questions after your visit or need to cancel or reschedule your appointment, please contact [facility]  [phone] and follow the prompts.  Office hours are 8:00 a.m. to 4:30 p.m. Monday - Friday. Please note that voicemails left after 4:00 p.m. may not be returned until the following business day.  We are closed weekends and major holidays. You have access to a nurse at all times for urgent questions. Please call the main number to the clinic [phone] and follow the prompts.  For any non-urgent questions, you may also contact your provider using MyChart. We now offer e-Visits for anyone 18 and older to request care online for non-urgent symptoms. For details visit [email or web address].   Also download the MyChart app! Go to the app store, search "MyChart", open the app, select [facility], and log in with your MyChart username and password.  Due to Covid, a mask is required upon entering the hospital/clinic. If you do not have a mask, one will be given to you upon arrival. For doctor visits, patients may have 1 support person aged 18 or older with them. For treatment visits, patients cannot have anyone with them due to current Covid guidelines and our immunocompromised population.  

## 2020-09-15 ENCOUNTER — Inpatient Hospital Stay: Payer: Medicare Other

## 2020-09-15 ENCOUNTER — Other Ambulatory Visit: Payer: Self-pay

## 2020-09-15 VITALS — BP 128/53 | HR 63 | Temp 97.5°F | Resp 18

## 2020-09-15 DIAGNOSIS — C44329 Squamous cell carcinoma of skin of other parts of face: Secondary | ICD-10-CM | POA: Diagnosis not present

## 2020-09-15 DIAGNOSIS — D45 Polycythemia vera: Secondary | ICD-10-CM | POA: Diagnosis not present

## 2020-09-15 DIAGNOSIS — E860 Dehydration: Secondary | ICD-10-CM | POA: Diagnosis not present

## 2020-09-15 DIAGNOSIS — D7581 Myelofibrosis: Secondary | ICD-10-CM | POA: Diagnosis not present

## 2020-09-15 DIAGNOSIS — N289 Disorder of kidney and ureter, unspecified: Secondary | ICD-10-CM | POA: Diagnosis not present

## 2020-09-15 DIAGNOSIS — D649 Anemia, unspecified: Secondary | ICD-10-CM | POA: Diagnosis not present

## 2020-09-15 MED ORDER — SODIUM CHLORIDE 0.9 % IV SOLN
Freq: Once | INTRAVENOUS | Status: AC
  Filled 2020-09-15: qty 250

## 2020-09-18 ENCOUNTER — Other Ambulatory Visit (HOSPITAL_COMMUNITY): Payer: Self-pay

## 2020-09-19 ENCOUNTER — Other Ambulatory Visit: Payer: Self-pay | Admitting: Internal Medicine

## 2020-09-19 ENCOUNTER — Encounter: Payer: Self-pay | Admitting: Internal Medicine

## 2020-09-19 ENCOUNTER — Other Ambulatory Visit (HOSPITAL_COMMUNITY): Payer: Self-pay

## 2020-09-19 ENCOUNTER — Ambulatory Visit (INDEPENDENT_AMBULATORY_CARE_PROVIDER_SITE_OTHER): Payer: Medicare Other | Admitting: Internal Medicine

## 2020-09-19 ENCOUNTER — Other Ambulatory Visit: Payer: Self-pay

## 2020-09-19 ENCOUNTER — Inpatient Hospital Stay: Payer: Medicare Other

## 2020-09-19 VITALS — BP 119/70 | HR 86 | Temp 97.9°F | Resp 18

## 2020-09-19 VITALS — BP 108/70 | HR 74 | Ht 67.0 in | Wt 112.4 lb

## 2020-09-19 DIAGNOSIS — D7581 Myelofibrosis: Secondary | ICD-10-CM | POA: Diagnosis not present

## 2020-09-19 DIAGNOSIS — N390 Urinary tract infection, site not specified: Secondary | ICD-10-CM

## 2020-09-19 DIAGNOSIS — N289 Disorder of kidney and ureter, unspecified: Secondary | ICD-10-CM | POA: Diagnosis not present

## 2020-09-19 DIAGNOSIS — E860 Dehydration: Secondary | ICD-10-CM | POA: Diagnosis not present

## 2020-09-19 DIAGNOSIS — D649 Anemia, unspecified: Secondary | ICD-10-CM | POA: Diagnosis not present

## 2020-09-19 DIAGNOSIS — D45 Polycythemia vera: Secondary | ICD-10-CM | POA: Diagnosis not present

## 2020-09-19 DIAGNOSIS — C44329 Squamous cell carcinoma of skin of other parts of face: Secondary | ICD-10-CM | POA: Diagnosis not present

## 2020-09-19 LAB — POC URINALYSIS WITH MICROSCOPIC (NON AUTO)MANUAL RESULT
Bilirubin, UA: NEGATIVE
Blood, UA: NEGATIVE
Crystals: 0
Glucose, UA: NEGATIVE
Ketones, UA: NEGATIVE
Mucus, UA: 0
Nitrite, UA: POSITIVE
Protein, UA: NEGATIVE
RBC: 3 M/uL — AB (ref 4.04–5.48)
Spec Grav, UA: 1.01 (ref 1.010–1.025)
Urobilinogen, UA: 0.2 E.U./dL
WBC Casts, UA: 30
pH, UA: 6 (ref 5.0–8.0)

## 2020-09-19 MED ORDER — SODIUM CHLORIDE 0.9 % IV SOLN
Freq: Once | INTRAVENOUS | Status: AC
  Filled 2020-09-19: qty 250

## 2020-09-19 MED ORDER — SULFAMETHOXAZOLE-TRIMETHOPRIM 800-160 MG PO TABS: 1.0000 | ORAL_TABLET | Freq: Two times a day (BID) | ORAL | 0 refills | Status: AC

## 2020-09-19 NOTE — Progress Notes (Signed)
Date:  09/19/2020   Name:  Laura Wilcox   DOB:  06/10/1926   MRN:  [id number]   Chief Complaint: Urinary Tract Infection  Urinary Tract Infection  This is a new problem. The current episode started in the past 7 days. The problem occurs intermittently. The pain is mild. There has been no fever. She is Not sexually active. Associated symptoms include frequency, hesitancy and urgency. Pertinent negatives include no chills, flank pain or hematuria. She has tried increased fluids for the symptoms.   Lab Results  Component Value Date   CREATININE 1.20 (H) 09/12/2020   BUN 33 (H) 09/12/2020   NA 134 (L) 09/12/2020   K 4.3 09/12/2020   CL 101 09/12/2020   CO2 25 09/12/2020   Lab Results  Component Value Date   CHOL 279 (H) 05/27/2018   HDL 72 05/27/2018   LDLCALC 182 (H) 05/27/2018   TRIG 125 05/27/2018   CHOLHDL 3.9 05/27/2018   Lab Results  Component Value Date   TSH 2.085 12/20/2019   No results found for: HGBA1C Lab Results  Component Value Date   WBC 9.6 09/12/2020   HGB 10.4 (L) 09/12/2020   HCT 33.2 (L) 09/12/2020   MCV 92.2 09/12/2020   PLT 568 (H) 09/12/2020   Lab Results  Component Value Date   ALT 15 09/12/2020   AST 22 09/12/2020   ALKPHOS 77 09/12/2020   BILITOT 0.3 09/12/2020     Review of Systems  Constitutional:  Negative for chills.  Cardiovascular:  Negative for chest pain.  Genitourinary:  Positive for frequency, hesitancy and urgency. Negative for flank pain and hematuria.  Psychiatric/Behavioral:  Positive for confusion.    Patient Active Problem List   Diagnosis Date Noted   Renal insufficiency 03/14/2020   Dehydration 03/12/2020   Myelofibrosis ([place]) 01/31/2020   Other pancytopenia ([place]) 01/31/2020   Squamous cell cancer of skin of left cheek 01/16/2020   Chronic kidney disease, stage 3b ([place]) 03/11/2019   Secondary hyperparathyroidism of renal origin ([place]) 03/11/2019   Anemia in chronic kidney disease 03/11/2019   Generalized  weakness 12/17/2018   Elevated ferritin 02/18/2018   Goals of care, counseling/discussion 07/09/2017   Polycythemia rubra vera ([place]) 06/02/2017   Underweight 06/02/2017   Advanced care planning/counseling discussion 06/02/2017   Cognitive impairment 06/02/2017    No Known Allergies  Past Surgical History:  Procedure Laterality Date   ABDOMINAL HYSTERECTOMY     complete   BREAST SURGERY      Social History   Tobacco Use   Smoking status: Former    Packs/day: 1.00    Years: 30.00    Pack years: 30.00    Types: Cigarettes    Quit date: 04/02/1978    Years since quitting: 42.4   Smokeless tobacco: Never  Vaping Use   Vaping Use: Never used  Substance Use Topics   Alcohol use: No    Comment: occasional glass of wine   Drug use: No     Medication list has been reviewed and updated.  Current Meds  Medication Sig   acetaminophen (TYLENOL) 325 MG tablet Take 650 mg by mouth every 6 (six) hours as needed.   calcitRIOL (ROCALTROL) 0.25 MCG capsule Take 0.25 mcg by mouth 3 (three) times a week.    mupirocin ointment (BACTROBAN) 2 % Apply to any open sores daily.   ruxolitinib phosphate (JAKAFI) 10 MG tablet TAKE 1 TABLET (10 MG TOTAL) BY MOUTH 2 (TWO) TIMES DAILY.  PHQ 2/9 Scores 07/04/2020 03/28/2020 01/31/2020 06/02/2017  PHQ - 2 Score 0 0 0 0  PHQ- 9 Score 0 0 0 0    GAD 7 : Generalized Anxiety Score 07/04/2020 03/28/2020 01/31/2020  Nervous, Anxious, on Edge 0 0 0  Control/stop worrying 0 0 0  Worry too much - different things 0 0 0  Trouble relaxing 0 0 0  Restless 0 0 0  Easily annoyed or irritable 0 0 0  Afraid - awful might happen 0 0 0  Total GAD 7 Score 0 0 0  Anxiety Difficulty - Not difficult at all Not difficult at all    BP Readings from Last 3 Encounters:  09/19/20 108/70  09/19/20 119/70  09/15/20 (!) 128/53    Physical Exam Cardiovascular:     Rate and Rhythm: Normal rate and regular rhythm.  Pulmonary:     Effort: Pulmonary effort is normal.   Abdominal:     General: Abdomen is flat.     Palpations: Abdomen is soft.     Tenderness: There is no abdominal tenderness. There is no right CVA tenderness or left CVA tenderness.  Lymphadenopathy:     Cervical: No cervical adenopathy.  Neurological:     Mental Status: She is alert.    Wt Readings from Last 3 Encounters:  09/19/20 112 lb 6.4 oz (51 kg)  09/12/20 112 lb 7 oz (51 kg)  08/18/20 115 lb (52.2 kg)    BP 108/70   Pulse 74   Ht 5\' 7"  (1.702 m)   Wt 112 lb 6.4 oz (51 kg)   BMI 17.60 kg/m   Assessment and Plan: 1. Recurrent UTI Acute UTI - treat with antibiotics Await culture and adjust as needed Increase fluid intake - POC urinalysis w microscopic (non auto) - Urine Culture - sulfamethoxazole-trimethoprim (BACTRIM DS) 800-160 MG tablet; Take 1 tablet by mouth 2 (two) times daily for 7 days.  Dispense: 14 tablet; Refill: 0   Partially dictated using Editor, commissioning. Any errors are unintentional.  Halina Maidens, MD [facility]  09/19/2020

## 2020-09-22 ENCOUNTER — Inpatient Hospital Stay: Payer: Medicare Other

## 2020-09-22 ENCOUNTER — Other Ambulatory Visit: Payer: Self-pay

## 2020-09-22 VITALS — BP 123/68 | HR 75 | Temp 96.8°F | Resp 16

## 2020-09-22 DIAGNOSIS — D7581 Myelofibrosis: Secondary | ICD-10-CM | POA: Diagnosis not present

## 2020-09-22 DIAGNOSIS — C44329 Squamous cell carcinoma of skin of other parts of face: Secondary | ICD-10-CM | POA: Diagnosis not present

## 2020-09-22 DIAGNOSIS — E860 Dehydration: Secondary | ICD-10-CM

## 2020-09-22 DIAGNOSIS — N289 Disorder of kidney and ureter, unspecified: Secondary | ICD-10-CM | POA: Diagnosis not present

## 2020-09-22 DIAGNOSIS — D45 Polycythemia vera: Secondary | ICD-10-CM | POA: Diagnosis not present

## 2020-09-22 DIAGNOSIS — D649 Anemia, unspecified: Secondary | ICD-10-CM | POA: Diagnosis not present

## 2020-09-22 MED ORDER — SODIUM CHLORIDE 0.9 % IV SOLN
Freq: Once | INTRAVENOUS | Status: AC
  Filled 2020-09-22: qty 250

## 2020-09-25 DIAGNOSIS — D631 Anemia in chronic kidney disease: Secondary | ICD-10-CM | POA: Diagnosis not present

## 2020-09-25 DIAGNOSIS — R809 Proteinuria, unspecified: Secondary | ICD-10-CM | POA: Diagnosis not present

## 2020-09-25 DIAGNOSIS — N2581 Secondary hyperparathyroidism of renal origin: Secondary | ICD-10-CM | POA: Diagnosis not present

## 2020-09-25 DIAGNOSIS — N1832 Chronic kidney disease, stage 3b: Secondary | ICD-10-CM | POA: Diagnosis not present

## 2020-09-26 ENCOUNTER — Inpatient Hospital Stay: Payer: Medicare Other

## 2020-09-26 ENCOUNTER — Other Ambulatory Visit: Payer: Self-pay

## 2020-09-26 ENCOUNTER — Other Ambulatory Visit (HOSPITAL_COMMUNITY): Payer: Self-pay

## 2020-09-26 DIAGNOSIS — E860 Dehydration: Secondary | ICD-10-CM

## 2020-09-26 DIAGNOSIS — N289 Disorder of kidney and ureter, unspecified: Secondary | ICD-10-CM | POA: Diagnosis not present

## 2020-09-26 DIAGNOSIS — D45 Polycythemia vera: Secondary | ICD-10-CM | POA: Diagnosis not present

## 2020-09-26 DIAGNOSIS — D649 Anemia, unspecified: Secondary | ICD-10-CM | POA: Diagnosis not present

## 2020-09-26 DIAGNOSIS — R5383 Other fatigue: Secondary | ICD-10-CM

## 2020-09-26 DIAGNOSIS — D7581 Myelofibrosis: Secondary | ICD-10-CM | POA: Diagnosis not present

## 2020-09-26 DIAGNOSIS — C44329 Squamous cell carcinoma of skin of other parts of face: Secondary | ICD-10-CM | POA: Diagnosis not present

## 2020-09-26 DIAGNOSIS — N2581 Secondary hyperparathyroidism of renal origin: Secondary | ICD-10-CM

## 2020-09-26 LAB — CBC WITH DIFFERENTIAL/PLATELET
Abs Immature Granulocytes: 1.07 10*3/uL — ABNORMAL HIGH (ref 0.00–0.07)
Basophils Absolute: 0.2 10*3/uL — ABNORMAL HIGH (ref 0.0–0.1)
Basophils Relative: 2 %
Eosinophils Absolute: 0.3 10*3/uL (ref 0.0–0.5)
Eosinophils Relative: 3 %
HCT: 32.4 % — ABNORMAL LOW (ref 36.0–46.0)
Hemoglobin: 10.2 g/dL — ABNORMAL LOW (ref 12.0–15.0)
Immature Granulocytes: 10 %
Lymphocytes Relative: 17 %
Lymphs Abs: 1.7 10*3/uL (ref 0.7–4.0)
MCH: 29.3 pg (ref 26.0–34.0)
MCHC: 31.5 g/dL (ref 30.0–36.0)
MCV: 93.1 fL (ref 80.0–100.0)
Monocytes Absolute: 0.7 10*3/uL (ref 0.1–1.0)
Monocytes Relative: 7 %
Neutro Abs: 6.3 10*3/uL (ref 1.7–7.7)
Neutrophils Relative %: 61 %
Platelets: 591 10*3/uL — ABNORMAL HIGH (ref 150–400)
RBC: 3.48 MIL/uL — ABNORMAL LOW (ref 3.87–5.11)
RDW: 22.7 % — ABNORMAL HIGH (ref 11.5–15.5)
WBC: 10.3 10*3/uL (ref 4.0–10.5)
nRBC: 0.5 % — ABNORMAL HIGH (ref 0.0–0.2)

## 2020-09-26 LAB — URINE CULTURE

## 2020-09-26 MED ORDER — SODIUM CHLORIDE 0.9 % IV SOLN
Freq: Once | INTRAVENOUS | Status: AC
Start: 2020-09-26 — End: 2020-09-26
  Filled 2020-09-26: qty 250

## 2020-09-28 DIAGNOSIS — R809 Proteinuria, unspecified: Secondary | ICD-10-CM | POA: Diagnosis not present

## 2020-09-28 DIAGNOSIS — N1832 Chronic kidney disease, stage 3b: Secondary | ICD-10-CM | POA: Diagnosis not present

## 2020-09-29 ENCOUNTER — Other Ambulatory Visit: Payer: Self-pay

## 2020-09-29 ENCOUNTER — Inpatient Hospital Stay: Payer: Medicare Other | Attending: Nurse Practitioner

## 2020-09-29 VITALS — BP 109/67 | HR 96 | Temp 97.2°F | Resp 18

## 2020-09-29 DIAGNOSIS — R5383 Other fatigue: Secondary | ICD-10-CM

## 2020-09-29 DIAGNOSIS — E860 Dehydration: Secondary | ICD-10-CM | POA: Diagnosis not present

## 2020-09-29 DIAGNOSIS — D45 Polycythemia vera: Secondary | ICD-10-CM | POA: Diagnosis not present

## 2020-09-29 MED ORDER — SODIUM CHLORIDE 0.9 % IV SOLN
Freq: Once | INTRAVENOUS | Status: AC
  Filled 2020-09-29: qty 250

## 2020-10-03 ENCOUNTER — Inpatient Hospital Stay: Payer: Medicare Other | Attending: Hematology and Oncology

## 2020-10-03 ENCOUNTER — Other Ambulatory Visit: Payer: Self-pay

## 2020-10-03 VITALS — BP 104/61 | HR 78 | Temp 97.8°F | Resp 18

## 2020-10-03 DIAGNOSIS — N1832 Chronic kidney disease, stage 3b: Secondary | ICD-10-CM | POA: Diagnosis present

## 2020-10-03 DIAGNOSIS — E860 Dehydration: Secondary | ICD-10-CM | POA: Diagnosis not present

## 2020-10-03 DIAGNOSIS — D649 Anemia, unspecified: Secondary | ICD-10-CM | POA: Insufficient documentation

## 2020-10-03 DIAGNOSIS — Z79899 Other long term (current) drug therapy: Secondary | ICD-10-CM | POA: Diagnosis not present

## 2020-10-03 DIAGNOSIS — N289 Disorder of kidney and ureter, unspecified: Secondary | ICD-10-CM | POA: Insufficient documentation

## 2020-10-03 DIAGNOSIS — D631 Anemia in chronic kidney disease: Secondary | ICD-10-CM | POA: Insufficient documentation

## 2020-10-03 DIAGNOSIS — D45 Polycythemia vera: Secondary | ICD-10-CM | POA: Insufficient documentation

## 2020-10-03 MED ORDER — SODIUM CHLORIDE 0.9 % IV SOLN
Freq: Once | INTRAVENOUS | Status: AC
  Filled 2020-10-03: qty 250

## 2020-10-04 DIAGNOSIS — N1832 Chronic kidney disease, stage 3b: Secondary | ICD-10-CM | POA: Diagnosis not present

## 2020-10-05 ENCOUNTER — Other Ambulatory Visit: Payer: Self-pay | Admitting: Oncology

## 2020-10-05 ENCOUNTER — Other Ambulatory Visit (HOSPITAL_COMMUNITY): Payer: Self-pay

## 2020-10-05 NOTE — Progress Notes (Signed)
Laura Wilcox,   Can you find out if she is taking her increased dose of Hydrea? We increased her dose at her last visit and her platelets count actually increased rather then decrease.   Faythe Casa, NP 10/05/2020 11:58 AM

## 2020-10-06 ENCOUNTER — Other Ambulatory Visit: Payer: Self-pay

## 2020-10-06 ENCOUNTER — Inpatient Hospital Stay: Payer: Medicare Other

## 2020-10-06 ENCOUNTER — Telehealth: Payer: Self-pay

## 2020-10-06 VITALS — BP 131/67 | HR 77 | Resp 18

## 2020-10-06 DIAGNOSIS — D649 Anemia, unspecified: Secondary | ICD-10-CM | POA: Diagnosis not present

## 2020-10-06 DIAGNOSIS — D45 Polycythemia vera: Secondary | ICD-10-CM | POA: Diagnosis not present

## 2020-10-06 DIAGNOSIS — E860 Dehydration: Secondary | ICD-10-CM

## 2020-10-06 DIAGNOSIS — N289 Disorder of kidney and ureter, unspecified: Secondary | ICD-10-CM | POA: Diagnosis not present

## 2020-10-06 DIAGNOSIS — Z79899 Other long term (current) drug therapy: Secondary | ICD-10-CM | POA: Diagnosis not present

## 2020-10-06 MED ORDER — SODIUM CHLORIDE 0.9 % IV SOLN
Freq: Once | INTRAVENOUS | Status: AC
  Filled 2020-10-06: qty 250

## 2020-10-06 NOTE — Telephone Encounter (Signed)
Sonia Baller needed to know if pt is currently taking increased dose of Jakafi 30mg  instead of 20. Pts platelets seem to have increased instead of decreased. Spoke to pts daughter and daughter reports, "pt has missed an estimated six doses this week." Daughter does not live with pt, checks off the meds when she goes over to pts home. Daughter states that pt has made it difficult to cooperate with drinking fluids. It is a constant battle and makes the daughter "seem like the bad guy." Daughter will like to know if it is possible or safe for pt to receive maximum amount of fluids in order to not deal with constant arguments, pt seems to be more calm and cooperative when she receives fluids from the facility. Informed daughter I will relay message to Sonia Baller and contact her with further information.

## 2020-10-10 ENCOUNTER — Other Ambulatory Visit: Payer: Self-pay

## 2020-10-10 ENCOUNTER — Inpatient Hospital Stay: Payer: Medicare Other

## 2020-10-10 ENCOUNTER — Other Ambulatory Visit: Payer: Self-pay | Admitting: Nurse Practitioner

## 2020-10-10 VITALS — BP 94/61 | HR 76 | Temp 97.6°F | Resp 18

## 2020-10-10 DIAGNOSIS — E860 Dehydration: Secondary | ICD-10-CM | POA: Diagnosis not present

## 2020-10-10 DIAGNOSIS — D7581 Myelofibrosis: Secondary | ICD-10-CM

## 2020-10-10 DIAGNOSIS — Z79899 Other long term (current) drug therapy: Secondary | ICD-10-CM | POA: Diagnosis not present

## 2020-10-10 DIAGNOSIS — N289 Disorder of kidney and ureter, unspecified: Secondary | ICD-10-CM | POA: Diagnosis not present

## 2020-10-10 DIAGNOSIS — D649 Anemia, unspecified: Secondary | ICD-10-CM | POA: Diagnosis not present

## 2020-10-10 DIAGNOSIS — D45 Polycythemia vera: Secondary | ICD-10-CM | POA: Diagnosis not present

## 2020-10-10 MED ORDER — SODIUM CHLORIDE 0.9 % IV SOLN
Freq: Once | INTRAVENOUS | Status: AC
  Filled 2020-10-10: qty 250

## 2020-10-10 MED ORDER — RUXOLITINIB PHOSPHATE 15 MG PO TABS: 15.0000 mg | ORAL_TABLET | Freq: Two times a day (BID) | ORAL | 1 refills | Status: DC

## 2020-10-11 ENCOUNTER — Other Ambulatory Visit (HOSPITAL_COMMUNITY): Payer: Self-pay

## 2020-10-11 MED ORDER — RUXOLITINIB PHOSPHATE 15 MG PO TABS
15.0000 mg | ORAL_TABLET | Freq: Two times a day (BID) | ORAL | 1 refills | Status: DC
  Filled 2020-10-11 – 2020-10-13 (×2): qty 60, 30d supply, fill #0

## 2020-10-11 NOTE — Addendum Note (Signed)
Addended by: Darl Pikes on: 10/11/2020 08:35 AM   Modules accepted: Orders

## 2020-10-12 ENCOUNTER — Other Ambulatory Visit (HOSPITAL_COMMUNITY): Payer: Self-pay

## 2020-10-12 ENCOUNTER — Telehealth: Payer: Self-pay | Admitting: *Deleted

## 2020-10-12 NOTE — Telephone Encounter (Signed)
Pharmacy requests ICD 10 for Jakafi. [phone]

## 2020-10-12 NOTE — Telephone Encounter (Signed)
Pharmacy notified.

## 2020-10-12 NOTE — Telephone Encounter (Signed)
Correction to my last note, Pharmacy not notified the number did not work.

## 2020-10-12 NOTE — Telephone Encounter (Signed)
Laura Wilcox I see the rx was submitted to [facility] yesterday but [facility] is calling.

## 2020-10-13 ENCOUNTER — Other Ambulatory Visit: Payer: Self-pay

## 2020-10-13 ENCOUNTER — Other Ambulatory Visit (HOSPITAL_COMMUNITY): Payer: Self-pay

## 2020-10-13 ENCOUNTER — Inpatient Hospital Stay: Payer: Medicare Other

## 2020-10-13 ENCOUNTER — Inpatient Hospital Stay (HOSPITAL_BASED_OUTPATIENT_CLINIC_OR_DEPARTMENT_OTHER): Payer: Medicare Other | Admitting: Nurse Practitioner

## 2020-10-13 VITALS — BP 119/55 | HR 88 | Temp 97.9°F | Resp 18 | Wt 111.0 lb

## 2020-10-13 DIAGNOSIS — R4189 Other symptoms and signs involving cognitive functions and awareness: Secondary | ICD-10-CM

## 2020-10-13 DIAGNOSIS — D631 Anemia in chronic kidney disease: Secondary | ICD-10-CM

## 2020-10-13 DIAGNOSIS — D7581 Myelofibrosis: Secondary | ICD-10-CM

## 2020-10-13 DIAGNOSIS — N1832 Chronic kidney disease, stage 3b: Secondary | ICD-10-CM

## 2020-10-13 DIAGNOSIS — N289 Disorder of kidney and ureter, unspecified: Secondary | ICD-10-CM | POA: Diagnosis not present

## 2020-10-13 DIAGNOSIS — D649 Anemia, unspecified: Secondary | ICD-10-CM | POA: Diagnosis not present

## 2020-10-13 DIAGNOSIS — Z79899 Other long term (current) drug therapy: Secondary | ICD-10-CM | POA: Diagnosis not present

## 2020-10-13 DIAGNOSIS — G3184 Mild cognitive impairment, so stated: Secondary | ICD-10-CM

## 2020-10-13 DIAGNOSIS — N2581 Secondary hyperparathyroidism of renal origin: Secondary | ICD-10-CM

## 2020-10-13 DIAGNOSIS — D61818 Other pancytopenia: Secondary | ICD-10-CM

## 2020-10-13 DIAGNOSIS — R634 Abnormal weight loss: Secondary | ICD-10-CM

## 2020-10-13 DIAGNOSIS — D45 Polycythemia vera: Secondary | ICD-10-CM | POA: Diagnosis not present

## 2020-10-13 DIAGNOSIS — Z0000 Encounter for general adult medical examination without abnormal findings: Secondary | ICD-10-CM

## 2020-10-13 DIAGNOSIS — E860 Dehydration: Secondary | ICD-10-CM

## 2020-10-13 DIAGNOSIS — R5383 Other fatigue: Secondary | ICD-10-CM

## 2020-10-13 DIAGNOSIS — R6889 Other general symptoms and signs: Secondary | ICD-10-CM

## 2020-10-13 LAB — COMPREHENSIVE METABOLIC PANEL
ALT: 13 U/L (ref 0–44)
AST: 21 U/L (ref 15–41)
Albumin: 4.1 g/dL (ref 3.5–5.0)
Alkaline Phosphatase: 69 U/L (ref 38–126)
Anion gap: 7 (ref 5–15)
BUN: 25 mg/dL — ABNORMAL HIGH (ref 8–23)
CO2: 27 mmol/L (ref 22–32)
Calcium: 9.2 mg/dL (ref 8.9–10.3)
Chloride: 102 mmol/L (ref 98–111)
Creatinine, Ser: 1.33 mg/dL — ABNORMAL HIGH (ref 0.44–1.00)
GFR, Estimated: 38 mL/min — ABNORMAL LOW (ref >60)
Glucose, Bld: 129 mg/dL — ABNORMAL HIGH (ref 70–99)
Potassium: 3.8 mmol/L (ref 3.5–5.1)
Sodium: 136 mmol/L (ref 135–145)
Total Bilirubin: 0.5 mg/dL (ref 0.3–1.2)
Total Protein: 7.1 g/dL (ref 6.5–8.1)

## 2020-10-13 LAB — CBC WITH DIFFERENTIAL/PLATELET
Abs Immature Granulocytes: 0.4 10*3/uL — ABNORMAL HIGH (ref 0.00–0.07)
Basophils Absolute: 0.1 10*3/uL (ref 0.0–0.1)
Basophils Relative: 1 %
Eosinophils Absolute: 0.4 10*3/uL (ref 0.0–0.5)
Eosinophils Relative: 4 %
HCT: 31.6 % — ABNORMAL LOW (ref 36.0–46.0)
Hemoglobin: 9.9 g/dL — ABNORMAL LOW (ref 12.0–15.0)
Immature Granulocytes: 4 %
Lymphocytes Relative: 13 %
Lymphs Abs: 1.2 10*3/uL (ref 0.7–4.0)
MCH: 29.2 pg (ref 26.0–34.0)
MCHC: 31.3 g/dL (ref 30.0–36.0)
MCV: 93.2 fL (ref 80.0–100.0)
Monocytes Absolute: 0.7 10*3/uL (ref 0.1–1.0)
Monocytes Relative: 8 %
Neutro Abs: 6.3 10*3/uL (ref 1.7–7.7)
Neutrophils Relative %: 70 %
Platelets: 450 10*3/uL — ABNORMAL HIGH (ref 150–400)
RBC: 3.39 MIL/uL — ABNORMAL LOW (ref 3.87–5.11)
RDW: 22.3 % — ABNORMAL HIGH (ref 11.5–15.5)
WBC: 9.2 10*3/uL (ref 4.0–10.5)
nRBC: 0.2 % (ref 0.0–0.2)

## 2020-10-13 LAB — VITAMIN B12: Vitamin B-12: 351 pg/mL (ref 180–914)

## 2020-10-13 MED ORDER — EPOETIN ALFA-EPBX 10000 UNIT/ML IJ SOLN
10000.0000 [IU] | Freq: Once | INTRAMUSCULAR | Status: AC
  Administered 2020-10-13: 10000 [IU] via SUBCUTANEOUS

## 2020-10-13 NOTE — Progress Notes (Signed)
[facility]  [address], [address] [place], [place] [address] Phone: [phone]  Fax: [phone]   Clinic Day:  10/13/2020   Referring physician: Glean Hess, MD  Chief Complaint: Aletta Edmunds is a 87 y.o. female with polycythemia rubra vera (PV) on Jakafi who is seen for 1 month assessment.   HPI: Dreonna Hussein is a 87 y.o. female with polycythemia rubra vera and secondary myelofibrosis.  She presented in 04/2002 with thrombocytosis (835,000).  JAK2 V617F was positive on 05/20/2012.     She developed pancytopenia in 12/2015.  Etiology is unclear (medication confusion or hydroxyurea + Agrylin).   She received Procrit 40,000 units (intermittently 09/2012 - 06/2016), Venofer (09/2012 - 10/2012), and Infed (12/2015 - 03/2016).  She received Neupogen during a period of neutropenia. She was anagrelide from 2004 - 08/13/2017.  She began [place] on 08/14/2017.  Jakafi was increased from 5 mg BID to 10 mg BID on 06/17/2018.  Jakafi was increased to 15 mg BID on 08/03/2018.  She has been back on Jakafi 5 mg BID since 02/01/2019 then 10 mg BID on 05/25/2019.  [name] was on hold from 12/20/2019 - 01/12/2020.  Jakafi 5 mg twice daily restarted on 01/12/2020 then increased to 10 mg in the morning and 5 mg in the evening on 02/02/2020. [name] is currently 20 mg po BID.  She has received Retacrit to maintain a hemoglobin > 10 (12/24/2019 and 06/26/2020).   She began a phlebotomy program on 08/06/2017.  She undergoes small volume (150 cc with replacement of 150 cc NS) if her HCT > 42.   Ferritin has been followed: 529 on 09/03/2017, 1020 on 12/03/2017, 423 on 02/18/2018, 858 on 11/17/2018, 785 on 09/23/2019 and 728 on 12/20/2019.  Iron saturation was 37% and TIBC 270 on 11/17/2018.  B12 was 477 and folate 36 on 11/17/2018.  B12 was 292 and folate 19.1 on 12/20/2019   The patient has received both doses of the COVID-19 vaccine.  Interval History: Patient is a  87 year old female with above history of polycythemia vera currently on Jakafi, who returns to clinic for follow-up.  Jakafi dose was recently increased patient has taken approximately 1 week at new dose.  Patient is poor historian without formal diagnosis of dementia.  She lives independently with support from her daughter who is 15 minutes away.  Daughter checks on her several times a week and documents missed medications.  Estimates that she has missed 6 doses per week objectively.  She has also been receiving monthly Retacrit.  Per daughter, patient does not drink enough fluid at home and she has previously been receiving 500 cc of IV fluids twice weekly given over 2 hours.  Tolerating Jakafi well without significant side effects.  No interval infections. No dizziness or falls. No bleeding. No nausea or diarrhea. Due to memory changes, history is provided primarily by patient's daughter.    Past Medical History:  Diagnosis Date   Confusion 12/30/2018   Polycythemia    Squamous cell carcinoma of skin 09/06/2020   R thumb - ED&C   Urinary tract infection without hematuria 01/02/2020   Urticaria 06/02/2017    Past Surgical History:  Procedure Laterality Date   ABDOMINAL HYSTERECTOMY     complete   BREAST SURGERY      Family History  Problem Relation Age of Onset   Heart disease Father    Cancer Sister        breast   Cancer Sister  breast   Alzheimer's disease Sister     Social History:  reports that she quit smoking about 42 years ago. Her smoking use included cigarettes. She has a 30.00 pack-year smoking history. She has never used smokeless tobacco. She reports that she does not drink alcohol and does not use drugs. She is widowed.  Patient is retired Sport and exercise psychologist. Patient moved to [facility] in 03/2017.  She lives independently in her own apartment in [place]. Patient denies known exposures to radiation on toxins.  Pt's daughter, [name], can be reached at [phone].  She lives in [place].  The patient is accompanied by her daughter today.   Allergies:  Allergies  Allergen Reactions   Bactrim [Sulfamethoxazole-Trimethoprim]     Due to kidney disease     Current Medications: Current Outpatient Medications  Medication Sig Dispense Refill   acetaminophen (TYLENOL) 325 MG tablet Take 650 mg by mouth every 6 (six) hours as needed.     calcitRIOL (ROCALTROL) 0.25 MCG capsule Take 0.25 mcg by mouth 3 (three) times a week.      mupirocin ointment (BACTROBAN) 2 % Apply to any open sores daily. 22 g 0   ruxolitinib phosphate (JAKAFI) 15 MG tablet Take 1 tablet (15 mg total) by mouth 2 (two) times daily. 60 tablet 1   No current facility-administered medications for this visit.    Review of Systems  Unable to perform ROS: Dementia (ROS provided by patient's daughter)  Constitutional: Negative.  Negative for chills, fever, malaise/fatigue and weight loss.  HENT:  Negative for congestion, ear pain, hearing loss, nosebleeds, sore throat and tinnitus.   Eyes: Negative.  Negative for blurred vision and double vision.  Respiratory: Negative.  Negative for cough, hemoptysis, sputum production, shortness of breath and wheezing.   Cardiovascular: Negative.  Negative for chest pain, palpitations and leg swelling.  Gastrointestinal: Negative.  Negative for abdominal pain, blood in stool, constipation, diarrhea, melena, nausea and vomiting.  Genitourinary:  Negative for dysuria, frequency and urgency.  Musculoskeletal:  Negative for back pain, falls, joint pain and myalgias.  Skin: Negative.  Negative for itching and rash.  Neurological: Negative.  Negative for dizziness, tingling, sensory change, loss of consciousness, weakness and headaches.  Endo/Heme/Allergies: Negative.  Negative for environmental allergies. Does not bruise/bleed easily.  Psychiatric/Behavioral:  Positive for memory loss. Negative for depression. The patient is not nervous/anxious and does not have insomnia.     Performance status (ECOG):  2  Vitals Blood pressure (!) 119/55, pulse 88, temperature 97.9 F (36.6 C), temperature source Tympanic, resp. rate 18, weight 111 lb (50.3 kg), SpO2 99 %.   Physical Exam Vitals reviewed.  Constitutional:      General: She is not in acute distress.    Appearance: She is well-developed. She is not ill-appearing.     Comments: accompanied  HENT:     Head: Normocephalic and atraumatic.     Nose: Nose normal. No congestion.     Mouth/Throat:     Pharynx: No oropharyngeal exudate.  Eyes:     General: No scleral icterus.    Conjunctiva/sclera: Conjunctivae normal.  Cardiovascular:     Rate and Rhythm: Normal rate and regular rhythm.     Pulses: Normal pulses.     Heart sounds: No murmur heard. Pulmonary:     Effort: Pulmonary effort is normal. No respiratory distress.  Abdominal:     General: There is no distension.     Tenderness: There is no abdominal tenderness. There is no guarding.  Musculoskeletal:  General: No deformity. Normal range of motion.     Right lower leg: No edema.     Left lower leg: No edema.     Comments: Ambulates without aids  Skin:    General: Skin is warm and dry.     Coloration: Skin is not pale.  Neurological:     Mental Status: She is alert. Mental status is at baseline.     Motor: No weakness.  Psychiatric:        Attention and Perception: Attention normal.        Mood and Affect: Mood and affect normal.        Behavior: Behavior normal. Behavior is cooperative.        Cognition and Memory: Cognition is impaired. Memory is impaired.    Appointment on 10/13/2020  Component Date Value Ref Range Status   WBC 10/13/2020 9.2  4.0 - 10.5 K/uL Final   RBC 10/13/2020 3.39 (A) 3.87 - 5.11 MIL/uL Final   Hemoglobin 10/13/2020 9.9 (A) 12.0 - 15.0 g/dL Final   HCT 10/13/2020 31.6 (A) 36.0 - 46.0 % Final   MCV 10/13/2020 93.2  80.0 - 100.0 fL Final   MCH 10/13/2020 29.2  26.0 - 34.0 pg Final   MCHC 10/13/2020 31.3   30.0 - 36.0 g/dL Final   RDW 10/13/2020 22.3 (A) 11.5 - 15.5 % Final   Platelets 10/13/2020 450 (A) 150 - 400 K/uL Final   nRBC 10/13/2020 0.2  0.0 - 0.2 % Final   Performed at [facility], [address] [place], [place] [address]   Neutrophils Relative % 10/13/2020 PENDING  % Incomplete   Neutro Abs 10/13/2020 PENDING  1.7 - 7.7 K/uL Incomplete   Band Neutrophils 10/13/2020 PENDING  % Incomplete   Lymphocytes Relative 10/13/2020 PENDING  % Incomplete   Lymphs Abs 10/13/2020 PENDING  0.7 - 4.0 K/uL Incomplete   Monocytes Relative 10/13/2020 PENDING  % Incomplete   Monocytes Absolute 10/13/2020 PENDING  0.1 - 1.0 K/uL Incomplete   Eosinophils Relative 10/13/2020 PENDING  % Incomplete   Eosinophils Absolute 10/13/2020 PENDING  0.0 - 0.5 K/uL Incomplete   Basophils Relative 10/13/2020 PENDING  % Incomplete   Basophils Absolute 10/13/2020 PENDING  0.0 - 0.1 K/uL Incomplete   WBC Morphology 10/13/2020 PENDING   Incomplete   RBC Morphology 10/13/2020 PENDING   Incomplete   Smear Review 10/13/2020 PENDING   Incomplete   Other 10/13/2020 PENDING  % Incomplete   nRBC 10/13/2020 PENDING  0 /100 WBC Incomplete   Metamyelocytes Relative 10/13/2020 PENDING  % Incomplete   Myelocytes 10/13/2020 PENDING  % Incomplete   Promyelocytes Relative 10/13/2020 PENDING  % Incomplete   Blasts 10/13/2020 PENDING  % Incomplete   Immature Granulocytes 10/13/2020 PENDING  % Incomplete   Abs Immature Granulocytes 10/13/2020 PENDING  0.00 - 0.07 K/uL Incomplete   CMP     Component Value Date/Time   NA 134 (L) 09/12/2020 1358   K 4.3 09/12/2020 1358   CL 101 09/12/2020 1358   CO2 25 09/12/2020 1358   GLUCOSE 142 (H) 09/12/2020 1358   BUN 33 (H) 09/12/2020 1358   CREATININE 1.20 (H) 09/12/2020 1358   CALCIUM 9.2 09/12/2020 1358   PROT 7.0 09/12/2020 1358   ALBUMIN 4.4 09/12/2020 1358   AST 22 09/12/2020 1358   ALT 15 09/12/2020 1358   ALKPHOS 77 09/12/2020 1358   BILITOT 0.3 09/12/2020  1358   GFRNONAA 43 (L) 09/12/2020 1358   GFRAA 49 (L) 12/27/2019  2683    Assessment:   Polycythemia rubra vera- several missed doses of jakafi. Jakafi recently increased to 30 mg daily. Dose adjusted to 15 mg tablets twice daily to improve compliance. Platelets improved today though not yet within goal of < 400,000. Plt count today is 450. Previously 591. No changes to [facility] today. Plan to recheck in 6 weeks.  Anemia: Hemoglobin is 9.9. Etiology thought to be secondary to CKD. Stable. Proceed with retacrit 10,000 units today. Retacrit if hemoglobin < 11 every 2 weeks.  Renal insufficiency- no indication for routine IV fluids at the cancer center as this is not indicated for management of PV or anemia. I've encouraged patient's daughter to reach out to her pcp and/or nephrology to discuss and if they feel patient needs regular IV fulids, they can consider home health.  Dementia/Memory Changes- patient has hx of dementia. Daughter declines evaluation and management with neurology. Suspect dehydration is related to underlying cognitive changes. Patient able to take in oral fluids and food and currently lives alone. Feel she would benefit from in home assistance in meeting her ADLs. Follow up with PCP.  Squamous cell carcinoma left cheek- s/p cryotherapy and debridement. Managed by Dr. Nehemiah Massed UTI- per daughter, hx of frequent uti. Recommend management by pcp or urology. Advised that recurrent UTI is not managed with IV fluids.   Plan: Retacrit today. 2, 4 weeks- H&H, +/- retacrit 6 weeks- lab (cbc), MD to establish care, +/- retacrit.   Beckey Rutter, DNP, AGNP-C  CC: Dr. Army Melia

## 2020-10-14 ENCOUNTER — Other Ambulatory Visit (HOSPITAL_COMMUNITY): Payer: Self-pay

## 2020-10-16 ENCOUNTER — Other Ambulatory Visit (HOSPITAL_COMMUNITY): Payer: Self-pay

## 2020-10-19 DIAGNOSIS — N1832 Chronic kidney disease, stage 3b: Secondary | ICD-10-CM | POA: Diagnosis not present

## 2020-10-19 DIAGNOSIS — D631 Anemia in chronic kidney disease: Secondary | ICD-10-CM | POA: Diagnosis not present

## 2020-10-19 DIAGNOSIS — R809 Proteinuria, unspecified: Secondary | ICD-10-CM | POA: Diagnosis not present

## 2020-10-22 ENCOUNTER — Inpatient Hospital Stay: Payer: Medicare Other

## 2020-10-22 ENCOUNTER — Other Ambulatory Visit: Payer: Self-pay

## 2020-10-22 ENCOUNTER — Emergency Department: Payer: Medicare Other

## 2020-10-22 ENCOUNTER — Inpatient Hospital Stay
Admission: EM | Admit: 2020-10-22 | Discharge: 2020-10-26 | DRG: 871 | Disposition: A | Discharge status: Skilled Nursing Facility | Payer: Medicare Other | Attending: Internal Medicine | Admitting: Internal Medicine

## 2020-10-22 ENCOUNTER — Emergency Department
Admission: EM | Admit: 2020-10-22 | Discharge: 2020-10-22 | Disposition: A | Discharge status: Home / Self Care | Payer: Medicare Other | Attending: Emergency Medicine | Admitting: Emergency Medicine

## 2020-10-22 DIAGNOSIS — F0390 Unspecified dementia without behavioral disturbance: Secondary | ICD-10-CM | POA: Diagnosis present

## 2020-10-22 DIAGNOSIS — I959 Hypotension, unspecified: Secondary | ICD-10-CM | POA: Diagnosis not present

## 2020-10-22 DIAGNOSIS — M6259 Muscle wasting and atrophy, not elsewhere classified, multiple sites: Secondary | ICD-10-CM | POA: Diagnosis not present

## 2020-10-22 DIAGNOSIS — N179 Acute kidney failure, unspecified: Secondary | ICD-10-CM | POA: Diagnosis present

## 2020-10-22 DIAGNOSIS — D45 Polycythemia vera: Secondary | ICD-10-CM | POA: Diagnosis present

## 2020-10-22 DIAGNOSIS — G9341 Metabolic encephalopathy: Secondary | ICD-10-CM | POA: Diagnosis present

## 2020-10-22 DIAGNOSIS — R279 Unspecified lack of coordination: Secondary | ICD-10-CM | POA: Diagnosis not present

## 2020-10-22 DIAGNOSIS — N1832 Chronic kidney disease, stage 3b: Secondary | ICD-10-CM | POA: Diagnosis present

## 2020-10-22 DIAGNOSIS — E861 Hypovolemia: Secondary | ICD-10-CM | POA: Diagnosis present

## 2020-10-22 DIAGNOSIS — B9620 Unspecified Escherichia coli [E. coli] as the cause of diseases classified elsewhere: Secondary | ICD-10-CM | POA: Diagnosis not present

## 2020-10-22 DIAGNOSIS — R1313 Dysphagia, pharyngeal phase: Secondary | ICD-10-CM | POA: Diagnosis not present

## 2020-10-22 DIAGNOSIS — R2689 Other abnormalities of gait and mobility: Secondary | ICD-10-CM | POA: Diagnosis not present

## 2020-10-22 DIAGNOSIS — N390 Urinary tract infection, site not specified: Secondary | ICD-10-CM | POA: Diagnosis present

## 2020-10-22 DIAGNOSIS — Z5321 Procedure and treatment not carried out due to patient leaving prior to being seen by health care provider: Secondary | ICD-10-CM | POA: Diagnosis not present

## 2020-10-22 DIAGNOSIS — E860 Dehydration: Secondary | ICD-10-CM | POA: Diagnosis present

## 2020-10-22 DIAGNOSIS — A4151 Sepsis due to Escherichia coli [E. coli]: Secondary | ICD-10-CM | POA: Diagnosis present

## 2020-10-22 DIAGNOSIS — Z79899 Other long term (current) drug therapy: Secondary | ICD-10-CM | POA: Diagnosis not present

## 2020-10-22 DIAGNOSIS — E876 Hypokalemia: Secondary | ICD-10-CM | POA: Diagnosis present

## 2020-10-22 DIAGNOSIS — Z4789 Encounter for other orthopedic aftercare: Secondary | ICD-10-CM | POA: Diagnosis not present

## 2020-10-22 DIAGNOSIS — R4182 Altered mental status, unspecified: Secondary | ICD-10-CM | POA: Diagnosis not present

## 2020-10-22 DIAGNOSIS — R41841 Cognitive communication deficit: Secondary | ICD-10-CM | POA: Diagnosis not present

## 2020-10-22 DIAGNOSIS — K449 Diaphragmatic hernia without obstruction or gangrene: Secondary | ICD-10-CM | POA: Diagnosis not present

## 2020-10-22 DIAGNOSIS — S72142D Displaced intertrochanteric fracture of left femur, subsequent encounter for closed fracture with routine healing: Secondary | ICD-10-CM | POA: Diagnosis not present

## 2020-10-22 DIAGNOSIS — R0600 Dyspnea, unspecified: Secondary | ICD-10-CM | POA: Diagnosis not present

## 2020-10-22 DIAGNOSIS — R509 Fever, unspecified: Secondary | ICD-10-CM | POA: Diagnosis not present

## 2020-10-22 DIAGNOSIS — D649 Anemia, unspecified: Secondary | ICD-10-CM | POA: Diagnosis not present

## 2020-10-22 DIAGNOSIS — Z20822 Contact with and (suspected) exposure to covid-19: Secondary | ICD-10-CM | POA: Diagnosis present

## 2020-10-22 DIAGNOSIS — E871 Hypo-osmolality and hyponatremia: Secondary | ICD-10-CM | POA: Diagnosis present

## 2020-10-22 DIAGNOSIS — R000 Tachycardia, unspecified: Secondary | ICD-10-CM | POA: Diagnosis not present

## 2020-10-22 DIAGNOSIS — E559 Vitamin D deficiency, unspecified: Secondary | ICD-10-CM | POA: Diagnosis not present

## 2020-10-22 DIAGNOSIS — R404 Transient alteration of awareness: Secondary | ICD-10-CM | POA: Diagnosis not present

## 2020-10-22 DIAGNOSIS — R5381 Other malaise: Secondary | ICD-10-CM | POA: Diagnosis not present

## 2020-10-22 DIAGNOSIS — M6281 Muscle weakness (generalized): Secondary | ICD-10-CM | POA: Diagnosis not present

## 2020-10-22 DIAGNOSIS — R0689 Other abnormalities of breathing: Secondary | ICD-10-CM | POA: Diagnosis not present

## 2020-10-22 DIAGNOSIS — A419 Sepsis, unspecified organism: Secondary | ICD-10-CM | POA: Diagnosis not present

## 2020-10-22 DIAGNOSIS — I700 Atherosclerosis of aorta: Secondary | ICD-10-CM | POA: Diagnosis not present

## 2020-10-22 DIAGNOSIS — R0902 Hypoxemia: Secondary | ICD-10-CM | POA: Diagnosis not present

## 2020-10-22 HISTORY — DX: Metabolic encephalopathy: G93.41

## 2020-10-22 LAB — CBC WITH DIFFERENTIAL/PLATELET
Abs Immature Granulocytes: 0.97 10*3/uL — ABNORMAL HIGH (ref 0.00–0.07)
Basophils Absolute: 0.1 10*3/uL (ref 0.0–0.1)
Basophils Relative: 0 %
Eosinophils Absolute: 0.1 10*3/uL (ref 0.0–0.5)
Eosinophils Relative: 1 %
HCT: 25.1 % — ABNORMAL LOW (ref 36.0–46.0)
Hemoglobin: 8.4 g/dL — ABNORMAL LOW (ref 12.0–15.0)
Immature Granulocytes: 7 %
Lymphocytes Relative: 3 %
Lymphs Abs: 0.5 10*3/uL — ABNORMAL LOW (ref 0.7–4.0)
MCH: 29.9 pg (ref 26.0–34.0)
MCHC: 33.5 g/dL (ref 30.0–36.0)
MCV: 89.3 fL (ref 80.0–100.0)
Monocytes Absolute: 1.5 10*3/uL — ABNORMAL HIGH (ref 0.1–1.0)
Monocytes Relative: 11 %
Neutro Abs: 10.4 10*3/uL — ABNORMAL HIGH (ref 1.7–7.7)
Neutrophils Relative %: 78 %
Platelets: 329 10*3/uL (ref 150–400)
RBC: 2.81 MIL/uL — ABNORMAL LOW (ref 3.87–5.11)
RDW: 21.7 % — ABNORMAL HIGH (ref 11.5–15.5)
Smear Review: NORMAL
WBC: 13.4 10*3/uL — ABNORMAL HIGH (ref 4.0–10.5)
nRBC: 0 % (ref 0.0–0.2)

## 2020-10-22 LAB — RESP PANEL BY RT-PCR (FLU A&B, COVID) ARPGX2
Influenza A by PCR: NEGATIVE
Influenza B by PCR: NEGATIVE
SARS Coronavirus 2 by RT PCR: NEGATIVE

## 2020-10-22 LAB — URINALYSIS, COMPLETE (UACMP) WITH MICROSCOPIC
Bilirubin Urine: NEGATIVE
Glucose, UA: NEGATIVE mg/dL
Ketones, ur: NEGATIVE mg/dL
Nitrite: NEGATIVE
Protein, ur: 30 mg/dL — AB
Specific Gravity, Urine: 1.004 — ABNORMAL LOW (ref 1.005–1.030)
pH: 6 (ref 5.0–8.0)

## 2020-10-22 LAB — PROCALCITONIN: Procalcitonin: 1.54 ng/mL

## 2020-10-22 LAB — COMPREHENSIVE METABOLIC PANEL
ALT: 17 U/L (ref 0–44)
AST: 19 U/L (ref 15–41)
Albumin: 2.7 g/dL — ABNORMAL LOW (ref 3.5–5.0)
Alkaline Phosphatase: 68 U/L (ref 38–126)
Anion gap: 8 (ref 5–15)
BUN: 50 mg/dL — ABNORMAL HIGH (ref 8–23)
CO2: 22 mmol/L (ref 22–32)
Calcium: 7.9 mg/dL — ABNORMAL LOW (ref 8.9–10.3)
Chloride: 97 mmol/L — ABNORMAL LOW (ref 98–111)
Creatinine, Ser: 1.99 mg/dL — ABNORMAL HIGH (ref 0.44–1.00)
GFR, Estimated: 23 mL/min — ABNORMAL LOW (ref >60)
Glucose, Bld: 139 mg/dL — ABNORMAL HIGH (ref 70–99)
Potassium: 3.1 mmol/L — ABNORMAL LOW (ref 3.5–5.1)
Sodium: 127 mmol/L — ABNORMAL LOW (ref 135–145)
Total Bilirubin: 0.8 mg/dL (ref 0.3–1.2)
Total Protein: 5.9 g/dL — ABNORMAL LOW (ref 6.5–8.1)

## 2020-10-22 LAB — CBG MONITORING, ED: Glucose-Capillary: 132 mg/dL — ABNORMAL HIGH (ref 70–99)

## 2020-10-22 LAB — APTT: aPTT: 29 seconds (ref 24–36)

## 2020-10-22 LAB — PROTIME-INR
INR: 1.1 (ref 0.8–1.2)
Prothrombin Time: 14.4 seconds (ref 11.4–15.2)

## 2020-10-22 LAB — LACTIC ACID, PLASMA: Lactic Acid, Venous: 1.2 mmol/L (ref 0.5–1.9)

## 2020-10-22 LAB — SODIUM, URINE, RANDOM: Sodium, Ur: 20 mmol/L

## 2020-10-22 LAB — CREATININE, URINE, RANDOM: Creatinine, Urine: 25 mg/dL

## 2020-10-22 MED ORDER — CIPROFLOXACIN IN D5W 400 MG/200ML IV SOLN
400.0000 mg | INTRAVENOUS | Status: DC
  Administered 2020-10-22: 400 mg via INTRAVENOUS
  Filled 2020-10-22 (×2): qty 200

## 2020-10-22 MED ORDER — SODIUM CHLORIDE 0.9 % IV SOLN
2.0000 g | Freq: Once | INTRAVENOUS | Status: AC
  Administered 2020-10-22: 2 g via INTRAVENOUS
  Filled 2020-10-22: qty 20

## 2020-10-22 MED ORDER — MORPHINE SULFATE (PF) 2 MG/ML IV SOLN: 2.0000 mg | INTRAVENOUS | Status: DC | PRN

## 2020-10-22 MED ORDER — HEPARIN SODIUM (PORCINE) 5000 UNIT/ML IJ SOLN
5000.0000 [IU] | Freq: Three times a day (TID) | INTRAMUSCULAR | Status: DC
  Administered 2020-10-22 – 2020-10-26 (×12): 5000 [IU] via SUBCUTANEOUS
  Filled 2020-10-22 (×13): qty 1

## 2020-10-22 MED ORDER — HYDROCODONE-ACETAMINOPHEN 5-325 MG PO TABS: 1.0000 | ORAL_TABLET | ORAL | Status: DC | PRN

## 2020-10-22 MED ORDER — CALCITRIOL 0.25 MCG PO CAPS
0.2500 ug | ORAL_CAPSULE | ORAL | Status: DC
  Administered 2020-10-23 – 2020-10-25 (×2): 0.25 ug via ORAL
  Filled 2020-10-22 (×2): qty 1

## 2020-10-22 MED ORDER — POLYETHYLENE GLYCOL 3350 17 G PO PACK: 17.0000 g | PACK | Freq: Every day | ORAL | Status: DC | PRN

## 2020-10-22 MED ORDER — POTASSIUM CHLORIDE 20 MEQ PO PACK
20.0000 meq | PACK | Freq: Once | ORAL | Status: AC
  Administered 2020-10-22: 20 meq via ORAL
  Filled 2020-10-22: qty 1

## 2020-10-22 MED ORDER — SODIUM CHLORIDE 0.9 % IV SOLN
1.0000 g | INTRAVENOUS | Status: DC
  Filled 2020-10-22: qty 10

## 2020-10-22 MED ORDER — ONDANSETRON HCL 4 MG PO TABS: 4.0000 mg | ORAL_TABLET | Freq: Four times a day (QID) | ORAL | Status: DC | PRN

## 2020-10-22 MED ORDER — ACETAMINOPHEN 325 MG PO TABS
650.0000 mg | ORAL_TABLET | Freq: Four times a day (QID) | ORAL | Status: DC | PRN
  Administered 2020-10-25: 650 mg via ORAL
  Filled 2020-10-22: qty 2

## 2020-10-22 MED ORDER — POTASSIUM CHLORIDE CRYS ER 20 MEQ PO TBCR
20.0000 meq | EXTENDED_RELEASE_TABLET | Freq: Once | ORAL | Status: DC
  Administered 2020-10-23: 20 meq via ORAL

## 2020-10-22 MED ORDER — SODIUM CHLORIDE 0.9 % IV SOLN: INTRAVENOUS | Status: AC

## 2020-10-22 MED ORDER — LACTATED RINGERS IV BOLUS (SEPSIS)
1000.0000 mL | Freq: Once | INTRAVENOUS | Status: AC
  Administered 2020-10-22: 1000 mL via INTRAVENOUS

## 2020-10-22 MED ORDER — ACETAMINOPHEN 650 MG RE SUPP: 650.0000 mg | Freq: Four times a day (QID) | RECTAL | Status: DC | PRN

## 2020-10-22 MED ORDER — LACTATED RINGERS IV BOLUS
500.0000 mL | Freq: Once | INTRAVENOUS | Status: AC
  Administered 2020-10-22: 500 mL via INTRAVENOUS

## 2020-10-22 MED ORDER — ONDANSETRON HCL 4 MG/2ML IJ SOLN: 4.0000 mg | Freq: Four times a day (QID) | INTRAMUSCULAR | Status: DC | PRN

## 2020-10-22 NOTE — H&P (Signed)
History and Physical    Laura Wilcox JFH:[id number] DOB: Nov 05, 1926 DOA: 10/22/2020  PCP: Laura Hess, MD  Chief Complaint: Lethargy, dehydration  HPI: Laura Wilcox is a 87 y.o. female with a past medical history of polycythemia vera on Jakafi, chronic kidney disease.  The patient presents to the emergency department due to dehydration and lethargy.  The daughter states that she was lethargic this morning.  The daughter lives close by.  The patient lives alone.  She is also confused.  She usually gets like this when she has a urinary tract infection.  Upon EMS arrival her blood pressure was in the 93T systolic.  Was given 500 cc of IV fluids in route.  Usually the patient is independent.  No assistance with ambulation.  Can do her own bills.  Upon my exam she can tell me her name and where she is.  But she cannot tell me the date.  Hemodynamics have improved with fluid resuscitation.    ED Course: UA, CBC, CMP, lactic acid, coags, chest x-ray, blood cultures, urine culture.  Given lactated Ringer 1.5 L bolus.  Given ceftriaxone 2 g IV x1.  Review of Systems: 14 point review of systems is negative except for what is mentioned above in the HPI.   History reviewed. No pertinent past medical history.  History reviewed. No pertinent surgical history.  Social History   Socioeconomic History   Marital status: Widowed    Spouse name: Not on file   Number of children: Not on file   Years of education: Not on file   Highest education level: Not on file  Occupational History   Not on file  Tobacco Use   Smoking status: Never   Smokeless tobacco: Never  Substance and Sexual Activity   Alcohol use: Not on file   Drug use: Not on file   Sexual activity: Not on file  Other Topics Concern   Not on file  Social History Narrative   Not on file   Social Determinants of Health   Financial Resource Strain: Not on file  Food Insecurity: Not on file  Transportation Needs: Not on  file  Physical Activity: Not on file  Stress: Not on file  Social Connections: Not on file  Intimate Partner Violence: Not on file    Not on File  History reviewed. No pertinent family history.  Prior to Admission medications   Medication Sig Start Date End Date Taking? Authorizing Provider  acetaminophen (TYLENOL) 325 MG tablet Take 325-650 mg by mouth every 6 (six) hours as needed for mild pain or moderate pain.   Yes [provider]  calcitRIOL (ROCALTROL) 0.25 MCG capsule Take 0.25 mcg by mouth every Monday, Wednesday, and Friday.   Yes [provider]  ruxolitinib phosphate (JAKAFI) 15 MG tablet Take 15 mg by mouth 2 (two) times daily.   Yes [provider]    Physical Exam: Vitals:   10/22/20 1730 10/22/20 1800 10/22/20 1900 10/22/20 1930  BP: (!) 142/59 (!) 150/61 109/67 (!) 159/73  Pulse: 80  72   Resp: (!) 30 (!) 28 (!) 21 (!) 22  Temp:      TempSrc:      SpO2: 100%  100%   Weight:      Height:         General: She appears uncomfortable with chills noted at bedside.  Clinically appears dry. Cardiovascular:  RRR, no m/r/g.  Respiratory:   Rhoncherous breath sounds on lung auscultation Abdomen:  soft, NT, ND, NABS Skin:  no rash or induration seen on limited exam Musculoskeletal:  grossly normal tone BUE/BLE, good ROM, no bony abnormality Lower extremity:  No LE edema.  Limited foot exam with no ulcerations.  2+ distal pulses. Psychiatric:  grossly normal mood and affect, speech fluent and appropriate, Aox2 to name and place Neurologic:  CN 2-12 grossly intact, moves all extremities in coordinated fashion, sensation intact    Radiological Exams on Admission: Independently reviewed - see discussion in A/P where applicable  DG Chest Port 1 View  Result Date: 10/22/2020 CLINICAL DATA:  Lethargy. Initial hypotension. Possible UTI or sepsis. Anemia. EXAM: PORTABLE CHEST 1 VIEW COMPARISON:  12/17/2018 FINDINGS: The patient is rotated to the  right on today's radiograph, reducing diagnostic sensitivity and specificity. Atherosclerotic calcification of the aortic arch. The lungs appear clear. Heart size within normal limits for projection and degree of rotation. No acute bony findings. No blunting of the costophrenic angles. IMPRESSION: 1. No findings of pneumonia. 2.  Aortic Atherosclerosis (ICD10-I70.0). Electronically Signed   By: Van Clines M.D.   On: 10/22/2020 17:32     Labs on Admission: I have personally reviewed the available labs and imaging studies at the time of the admission.  Pertinent labs: Hemoglobin 8.4, WBC 13.4, hematocrit 25.1 with mild left shift, lactic acid 1.2, sodium 127, potassium 3.1, chloride 97, bicarb 22, BUN 50, creatinine 1.99, blood glucose 139, calcium 7.9, albumin 2.7.  Urinalysis: Moderate leukocytes, WBC 11-20 with many bacteria.     Assessment/Plan: Acute metabolic encephalopathy secondary to uncomplicated urinary tract infection: The patient will be admitted to the medical/surgical floor under inpatient status.  Review of previous urine cultures show that she has grown Proteus sensitive to ceftriaxone.  Klebsiella sensitive to ceftriaxone.  She has also grown Enterobacter that is sensitive to Cipro.  The patient has been given ceftriaxone 2 g IV x1 in the emergency department.  We will continue with ceftriaxone 1 g IV daily.  In addition we will start Cipro until urine cultures come back.  Blood cultures ordered and pending results.  Hemodynamics are now stable with IV fluid resuscitation.  Patient has been given lactated Ringer's 1.5 L bolus in the emergency department.  We will start normal saline 100 cc an hour x1 L bag.  Sepsis secondary to above: Plan as above.  Sepsis criteria met with mild tachypnea and leukocytosis.  Obtain procalcitonin.  Acute kidney injury on chronic kidney disease: We will obtain urine electrolytes.  Likely prerenal secondary to dehydration.  IV fluids as mentioned  above.  Avoid nephrotoxic medications.  Continue Wilcox Calcitrol.  Hypovolemic hyponatremia: Plan as above  Hypokalemia: Will be repleted with oral potassium supplementation.  Shortness of breath: She is having rhonchi and wheezing on lung exam now.  We will obtain a portable chest x-ray.  She is not acutely hypoxic.  Polycythemia vera: No acute treatment    Level of Care: MedSurg DVT prophylaxis: Heparin subcu Code Status: Full code Consults: None Admission status: Inpatient   Laura Home DO [facility]   How to contact the [facility] Attending or Consulting provider [place] or covering provider during after hours [place], for this patient?  Check the care team in [facility] and look for a) attending/consulting [facility] provider listed and b) the [facility] team listed Log into [email or web address] and use [place] universal password to access. If you do not have the password, please contact the hospital operator. Locate the [facility] provider you are  looking for under [facility] and page to a number that you can be directly reached. If you still have difficulty reaching the provider, please page the [facility] (Director on Call) for the Hospitalists listed on amion for assistance.   10/22/2020, 8:30 PM

## 2020-10-22 NOTE — ED Notes (Signed)
Lab called to confirm blood work sent for this pt's account and not merged account.

## 2020-10-22 NOTE — ED Provider Notes (Signed)
[facility] Emergency Department Provider Note  ____________________________________________   Event Date/Time   First MD Initiated Contact with Patient 10/22/20 1651     (approximate)  I have reviewed the triage vital signs and the nursing notes.   HISTORY  Chief Complaint Dehydration    HPI Laura Wilcox is a 87 y.o. female here with generalized weakness, confusion. History provided primarily by pt's daughter. Per daughter's report, pt has had increasing weakness, fatigue for the past 2-3 days. Pt ahs had poor PO intake, confusion, and was too weak to even stand overnight. Pt has had increasing frequency. Laura Wilcox has been more forgetful. No pain. Denies any complaints to me, btu is confused, does not remember why Laura Wilcox is here. Of note, daughter reports a temp of 101F earlier today.  Level 5 caveat invoked as remainder of history, ROS, and physical exam limited due to patient's mental status change/dementia.         History reviewed. No pertinent past medical history.  Patient Active Problem List   Diagnosis Date Noted   Acute metabolic encephalopathy 09/32/6712    History reviewed. No pertinent surgical history.  Prior to Admission medications   Medication Sig Start Date End Date Taking? Authorizing Provider  acetaminophen (TYLENOL) 325 MG tablet Take 325-650 mg by mouth every 6 (six) hours as needed for mild pain or moderate pain.   Yes [provider]  calcitRIOL (ROCALTROL) 0.25 MCG capsule Take 0.25 mcg by mouth every Monday, Wednesday, and Friday.   Yes [provider]  ruxolitinib phosphate (JAKAFI) 15 MG tablet Take 15 mg by mouth 2 (two) times daily.   Yes [provider]    Allergies Patient has no allergy information on record.  History reviewed. No pertinent family history.  Social History Social History   Tobacco Use   Smoking status: Never   Smokeless tobacco: Never    Review of Systems  Review  of Systems  Unable to perform ROS: Mental status change  Constitutional:  Positive for fatigue.  Neurological:  Positive for weakness.    ____________________________________________  PHYSICAL EXAM:      VITAL SIGNS: ED Triage Vitals  Enc Vitals Group     BP 10/22/20 1711 (!) 105/53     Pulse Rate 10/22/20 1711 80     Resp 10/22/20 1711 (!) 30     Temp 10/22/20 1711 98.2 F (36.8 C)     Temp Source 10/22/20 1711 Oral     SpO2 10/22/20 1711 97 %     Weight 10/22/20 1712 110 lb 14.3 oz (50.3 kg)     Height 10/22/20 1712 5\' 7"  (1.702 m)     Head Circumference --      Peak Flow --      Pain Score 10/22/20 1712 0     Pain Loc --      Pain Edu? --      Excl. in [place]? --      Physical Exam Vitals and nursing note reviewed.  Constitutional:      General: Laura Wilcox is not in acute distress.    Appearance: Laura Wilcox is well-developed.  HENT:     Head: Normocephalic and atraumatic.     Mouth/Throat:     Mouth: Mucous membranes are dry.  Eyes:     Conjunctiva/sclera: Conjunctivae normal.  Cardiovascular:     Rate and Rhythm: Normal rate and regular rhythm.     Heart sounds: Normal heart sounds.  Pulmonary:     Effort:  Pulmonary effort is normal. No respiratory distress.     Breath sounds: No wheezing.  Abdominal:     General: There is no distension.  Musculoskeletal:     Cervical back: Neck supple.  Skin:    General: Skin is warm.     Capillary Refill: Capillary refill takes less than 2 seconds.     Findings: No rash.  Neurological:     Mental Status: Laura Wilcox is alert. Laura Wilcox is disoriented and confused.     Motor: No abnormal muscle tone.      ____________________________________________   LABS (all labs ordered are listed, but only abnormal results are displayed)  Labs Reviewed  COMPREHENSIVE METABOLIC PANEL - Abnormal; Notable for the following components:      Result Value   Sodium 127 (*)    Potassium 3.1 (*)    Chloride 97 (*)    Glucose, Bld 139 (*)    BUN 50 (*)     Creatinine, Ser 1.99 (*)    Calcium 7.9 (*)    Total Protein 5.9 (*)    Albumin 2.7 (*)    GFR, Estimated 23 (*)    All other components within normal limits  CBC WITH DIFFERENTIAL/PLATELET - Abnormal; Notable for the following components:   WBC 13.4 (*)    RBC 2.81 (*)    Hemoglobin 8.4 (*)    HCT 25.1 (*)    RDW 21.7 (*)    Neutro Abs 10.4 (*)    Lymphs Abs 0.5 (*)    Monocytes Absolute 1.5 (*)    Abs Immature Granulocytes 0.97 (*)    All other components within normal limits  URINALYSIS, COMPLETE (UACMP) WITH MICROSCOPIC - Abnormal; Notable for the following components:   Color, Urine YELLOW (*)    APPearance HAZY (*)    Specific Gravity, Urine 1.004 (*)    Hgb urine dipstick MODERATE (*)    Protein, ur 30 (*)    Leukocytes,Ua MODERATE (*)    Bacteria, UA MANY (*)    All other components within normal limits  CULTURE, BLOOD (SINGLE)  URINE CULTURE  CULTURE, BLOOD (ROUTINE X 2)  CULTURE, BLOOD (ROUTINE X 2)  RESP PANEL BY RT-PCR (FLU A&B, COVID) ARPGX2  LACTIC ACID, PLASMA  PROTIME-INR  APTT  LACTIC ACID, PLASMA  SODIUM, URINE, RANDOM  CREATININE, URINE, RANDOM  PROCALCITONIN  PROCALCITONIN  BASIC METABOLIC PANEL  CBC WITH DIFFERENTIAL/PLATELET    ____________________________________________  EKG:  ________________________________________  RADIOLOGY All imaging, including plain films, CT scans, and ultrasounds, independently reviewed by me, and interpretations confirmed via formal radiology reads.  ED MD interpretation:   Chest x-ray: No findings of pneumonia  Official radiology report(s): DG Chest Port 1 View  Result Date: 10/22/2020 CLINICAL DATA:  Lethargy. Initial hypotension. Possible UTI or sepsis. Anemia. EXAM: PORTABLE CHEST 1 VIEW COMPARISON:  12/17/2018 FINDINGS: The patient is rotated to the right on today's radiograph, reducing diagnostic sensitivity and specificity. Atherosclerotic calcification of the aortic arch. The lungs appear clear. Heart  size within normal limits for projection and degree of rotation. No acute bony findings. No blunting of the costophrenic angles. IMPRESSION: 1. No findings of pneumonia. 2.  Aortic Atherosclerosis (ICD10-I70.0). Electronically Signed   By: Van Clines M.D.   On: 10/22/2020 17:32    ____________________________________________  PROCEDURES   Procedure(s) performed (including Critical Care):  Procedures  ____________________________________________  INITIAL IMPRESSION / MDM / [place] / ED COURSE  As part of my medical decision making, I reviewed the following  data within the [place] notes reviewed and incorporated, Old chart reviewed, Notes from prior ED visits, and [place] Controlled Substance Database       *Akylah Hascall was evaluated in Emergency Department on 10/22/2020 for the symptoms described in the history of present illness. Laura Wilcox was evaluated in the context of the global COVID-19 pandemic, which necessitated consideration that the patient might be at risk for infection with the SARS-CoV-2 virus that causes COVID-19. Institutional protocols and algorithms that pertain to the evaluation of patients at risk for COVID-19 are in a state of rapid change based on information released by regulatory bodies including the CDC and federal and state organizations. These policies and algorithms were followed during the patient's care in the ED.  Some ED evaluations and interventions may be delayed as a result of limited staffing during the pandemic.*     Medical Decision Making: 87 year old female here with generalized weakness and increased confusion.  Patient appears mildly dehydrated clinically.  Laura Wilcox is slightly confused but has no focal deficits.  Lab work reveals leukocytosis, likely acute on chronic kidney injury with baseline of 1.3 and creatinine of 1.99 today, as well as suspected UTI.  Laura Wilcox has no flank pain to suggest significant [place]o or stone.   Chest x-ray reviewed and is clear.  Patient will be admitted for sepsis secondary to UTI.  Laura Wilcox was given IV fluids, empiric antibiotics.  No alternative sources of infection.  ____________________________________________  FINAL CLINICAL IMPRESSION(S) / ED DIAGNOSES  Final diagnoses:  Sepsis secondary to UTI ([place])     MEDICATIONS GIVEN DURING THIS VISIT:  Medications  cefTRIAXone (ROCEPHIN) 1 g in sodium chloride 0.9 % 100 mL IVPB (has no administration in time range)  0.9 %  sodium chloride infusion (has no administration in time range)  lactated ringers bolus 1,000 mL (0 mLs Intravenous Stopped 10/22/20 1815)  cefTRIAXone (ROCEPHIN) 2 g in sodium chloride 0.9 % 100 mL IVPB (0 g Intravenous Stopped 10/22/20 1855)  lactated ringers bolus 500 mL (500 mLs Intravenous New Bag/Given 10/22/20 1850)     ED Discharge Orders     None        Note:  This document was prepared using Dragon voice recognition software and may include unintentional dictation errors.   Duffy Bruce, MD 10/22/20 2019

## 2020-10-22 NOTE — Progress Notes (Signed)
Pharmacy Antibiotic Note  Laura Wilcox is a 87 y.o. female admitted on 10/22/2020 with UTI.  Pharmacy has been consulted for Ciprofloxacin dosing.  Previously grown Enterobacter.   Plan: Ciprofloxacin 400 mg IV Q24H ordered to start on 7/24 @ 2230  Height: 5\' 7"  (170.2 cm) Weight: 50.3 kg (110 lb 14.3 oz) IBW/kg (Calculated) : 61.6  Temp (24hrs), Avg:98.8 F (37.1 C), Min:98.2 F (36.8 C), Max:99.3 F (37.4 C)  Recent Labs  Lab 10/22/20 1657  WBC 13.4*  CREATININE 1.99*  LATICACIDVEN 1.2    Estimated Creatinine Clearance: 14.6 mL/min (A) (by C-G formula based on SCr of 1.99 mg/dL (H)).    Not on File  Antimicrobials this admission:   >>    >>   Dose adjustments this admission:   Microbiology results:  BCx:   UCx:    Sputum:    MRSA PCR:   Thank you for allowing pharmacy to be a part of this patient's care.  Laura Wilcox D 10/22/2020 10:24 PM

## 2020-10-22 NOTE — ED Notes (Signed)
EDP at bedside  

## 2020-10-22 NOTE — ED Notes (Signed)
Daughter at bedside. Daughter stating pt starting acting lethargic this morning around 5am.

## 2020-10-22 NOTE — ED Triage Notes (Signed)
Pt to ED via [facility] from home. Family called due to pt having possible UTI and dehydration. EMS stating possible sepsis alert. Pt BP 83K systolic initially. 539ml fluids given in route. 99.7 oral. Pt A&Ox4. Pt with hx UTI

## 2020-10-22 NOTE — ED Notes (Signed)
Patient is resting comfortably. 

## 2020-10-22 NOTE — ED Triage Notes (Signed)
Pt to ED via [facility] from home. Family called due to pt having possible UTI and dehydration. EMS stating possible sepsis alert. Pt BP 75Q systolic initially. 547ml fluids given in route. 99.7 oral. Pt A&Ox4. Pt with hx UTI. Pt denies any complaints.

## 2020-10-23 ENCOUNTER — Other Ambulatory Visit: Payer: Self-pay | Admitting: Family Medicine

## 2020-10-23 DIAGNOSIS — G9341 Metabolic encephalopathy: Secondary | ICD-10-CM

## 2020-10-23 LAB — BLOOD CULTURE ID PANEL (REFLEXED) - BCID2

## 2020-10-23 LAB — CBC WITH DIFFERENTIAL/PLATELET
Abs Immature Granulocytes: 1.16 10*3/uL — ABNORMAL HIGH (ref 0.00–0.07)
Basophils Absolute: 0.1 10*3/uL (ref 0.0–0.1)
Basophils Relative: 1 %
Eosinophils Absolute: 0.1 10*3/uL (ref 0.0–0.5)
Eosinophils Relative: 1 %
HCT: 26.2 % — ABNORMAL LOW (ref 36.0–46.0)
Hemoglobin: 8.5 g/dL — ABNORMAL LOW (ref 12.0–15.0)
Immature Granulocytes: 10 %
Lymphocytes Relative: 4 %
Lymphs Abs: 0.5 10*3/uL — ABNORMAL LOW (ref 0.7–4.0)
MCH: 29.4 pg (ref 26.0–34.0)
MCHC: 32.4 g/dL (ref 30.0–36.0)
MCV: 90.7 fL (ref 80.0–100.0)
Monocytes Absolute: 1.1 10*3/uL — ABNORMAL HIGH (ref 0.1–1.0)
Monocytes Relative: 9 %
Neutro Abs: 9.3 10*3/uL — ABNORMAL HIGH (ref 1.7–7.7)
Neutrophils Relative %: 75 %
Platelets: 367 10*3/uL (ref 150–400)
RBC: 2.89 MIL/uL — ABNORMAL LOW (ref 3.87–5.11)
RDW: 21.9 % — ABNORMAL HIGH (ref 11.5–15.5)
Smear Review: NORMAL
WBC: 12.2 10*3/uL — ABNORMAL HIGH (ref 4.0–10.5)
nRBC: 0 % (ref 0.0–0.2)

## 2020-10-23 LAB — LACTIC ACID, PLASMA: Lactic Acid, Venous: 1.9 mmol/L (ref 0.5–1.9)

## 2020-10-23 LAB — PROCALCITONIN: Procalcitonin: 1.42 ng/mL

## 2020-10-23 LAB — BASIC METABOLIC PANEL
Anion gap: 8 (ref 5–15)
BUN: 35 mg/dL — ABNORMAL HIGH (ref 8–23)
CO2: 24 mmol/L (ref 22–32)
Calcium: 8.1 mg/dL — ABNORMAL LOW (ref 8.9–10.3)
Chloride: 100 mmol/L (ref 98–111)
Creatinine, Ser: 1.67 mg/dL — ABNORMAL HIGH (ref 0.44–1.00)
GFR, Estimated: 29 mL/min — ABNORMAL LOW (ref >60)
Glucose, Bld: 110 mg/dL — ABNORMAL HIGH (ref 70–99)
Potassium: 3.6 mmol/L (ref 3.5–5.1)
Sodium: 132 mmol/L — ABNORMAL LOW (ref 135–145)

## 2020-10-23 MED ORDER — SODIUM CHLORIDE 0.9 % IV SOLN
2.0000 g | INTRAVENOUS | Status: DC
  Administered 2020-10-23 – 2020-10-24 (×2): 2 g via INTRAVENOUS
  Filled 2020-10-23: qty 2
  Filled 2020-10-23: qty 20
  Filled 2020-10-23: qty 2

## 2020-10-23 MED ORDER — SODIUM CHLORIDE 0.9 % IV SOLN: INTRAVENOUS | Status: DC

## 2020-10-23 MED ORDER — SODIUM CHLORIDE 0.9 % IV SOLN
1.0000 g | INTRAVENOUS | Status: DC
  Filled 2020-10-23: qty 10

## 2020-10-23 NOTE — Evaluation (Signed)
Occupational Therapy Evaluation Patient Details Name: Laura Wilcox MRN: [id number] DOB: 10-02-1926 Today's Date: 10/23/2020    History of Present Illness 87 y.o. female with a past medical history of polycythemia vera on Jakafi, chronic kidney disease.  The patient presents to the emergency department due to dehydration and lethargy.   Clinical Impression   Laura Wilcox was seen for OT evaluation this date. Prior to hospital admission, pt was Independent for mobility and ADLs, assist from daughter for transportation. Pt lives alone in level entry apartment, family available PRN. Pt presents to acute OT demonstrating impaired ADL performance and functional mobility 2/2 decreased activity tolerance, functional cognition/strength/endurance deficits, and poor safety awareness.   Pt currently requires MIN A x2 + HHA for BSC t/f, +2 for lines/leads mgmt. MAX A perihygiene in standing. SETUP don B socks seated EOB, MAX A doff seated on BSC. MIN A don gown seated on BSC. Pt would benefit from skilled OT to address noted impairments and functional limitations (see below for any additional details) in order to maximize safety and independence while minimizing falls risk and caregiver burden. Upon hospital discharge, recommend STR to maximize pt safety and return to PLOF.     Follow Up Recommendations  SNF    Equipment Recommendations  3 in 1 bedside commode    Recommendations for Other Services       Precautions / Restrictions Precautions Precautions: Fall Restrictions Weight Bearing Restrictions: No      Mobility Bed Mobility Overal bed mobility: Needs Assistance Bed Mobility: Supine to Sit     Supine to sit: Min guard;HOB elevated          Transfers Overall transfer level: Needs assistance Equipment used: 1 person hand held assist Transfers: Sit to/from Stand Sit to Stand: Min assist;From elevated surface              Balance Overall balance assessment: Needs  assistance Sitting-balance support: No upper extremity supported;Feet supported Sitting balance-Leahy Scale: Fair     Standing balance support: Bilateral upper extremity supported Standing balance-Leahy Scale: Fair                             ADL either performed or assessed with clinical judgement   ADL Overall ADL's : Needs assistance/impaired                                       General ADL Comments: MIN A x2 + HHA for BSC t/f, +2 for lines/leads mgmt. MAX A perihygiene in standing. SETUP don B socks seated EOB, MAX A doff seated on BSC. MIN A don gown seated on BSC.      Pertinent Vitals/Pain Pain Assessment: Faces Faces Pain Scale: Hurts little more Pain Location: sensitive to touch all over Pain Descriptors / Indicators: Grimacing Pain Intervention(s): Limited activity within patient's tolerance;Repositioned     Hand Dominance Right   Extremity/Trunk Assessment Upper Extremity Assessment Upper Extremity Assessment: Generalized weakness   Lower Extremity Assessment Lower Extremity Assessment: Generalized weakness       Communication Communication Communication: HOH   Cognition Arousal/Alertness: Awake/alert Behavior During Therapy: WFL for tasks assessed/performed;Agitated Overall Cognitive Status: Impaired/Different from baseline Area of Impairment: Orientation;Following commands                 Orientation Level: Disoriented to;Place;Situation;Time     Following Commands: Follows one step  commands inconsistently       General Comments: Oriented to self consistently. States her age as 87 (incorrect) later correctly states time (per clock). Unable to state location or situation. Pt has STM deficits - does not recall prior complaints she made less than 2 min prior during session   General Comments  Highest HR 122 during standing    Exercises Exercises: Other exercises Other Exercises Other Exercises: Pt and family  educated re: OT role, DME recs, d/c recs, falls prevention, ECS, delirium pcns Other Exercises: LBD, toileting, UBD, sup>sit, sit<>stand, sitting/standing balance/tolerance, ~20 ft mobility   Shoulder Instructions      Home Living Family/patient expects to be discharged to:: Private residence Living Arrangements: Alone Available Help at Discharge: Family;Available PRN/intermittently Type of Home: Apartment Home Access: Level entry     Home Layout: One level     Bathroom Shower/Tub: Tub/shower unit         Home Equipment: Grab bars - tub/shower          Prior Functioning/Environment Level of Independence: Independent        Comments: Pt's daughter drives pt to appointments/grocery store, assists with bills        OT Problem List: Decreased strength;Decreased activity tolerance;Impaired balance (sitting and/or standing);Decreased cognition;Decreased safety awareness;Decreased knowledge of use of DME or AE      OT Treatment/Interventions: Self-care/ADL training;Therapeutic exercise;Energy conservation;DME and/or AE instruction;Therapeutic activities;Patient/family education;Balance training    OT Goals(Current goals can be found in the care plan section) Acute Rehab OT Goals Patient Stated Goal: to return to PLOF OT Goal Formulation: With patient/family Time For Goal Achievement: 11/06/20 Potential to Achieve Goals: Good ADL Goals Pt Will Perform Grooming: standing;with supervision Pt Will Perform Lower Body Dressing: with modified independence;sit to/from stand (c LRAD PRN) Pt Will Transfer to Toilet: with modified independence;ambulating;regular height toilet (c LRAD PRN)  OT Frequency: Min 2X/week   Barriers to D/C: Decreased caregiver support          Co-evaluation PT/OT/SLP Co-Evaluation/Treatment: Yes Reason for Co-Treatment: Necessary to address cognition/behavior during functional activity PT goals addressed during session: Mobility/safety with  mobility;Balance OT goals addressed during session: ADL's and self-care      AM-PAC OT "6 Clicks" Daily Activity     Outcome Measure Help from another person eating meals?: None Help from another person taking care of personal grooming?: A Little Help from another person toileting, which includes using toliet, bedpan, or urinal?: A Lot Help from another person bathing (including washing, rinsing, drying)?: A Lot Help from another person to put on and taking off regular upper body clothing?: A Little Help from another person to put on and taking off regular lower body clothing?: A Little 6 Click Score: 17   End of Session Nurse Communication: Mobility status  Activity Tolerance: Patient tolerated treatment well Patient left: in chair;with call bell/phone within reach;with chair alarm set;with family/visitor present (lap belt chair alarm)  OT Visit Diagnosis: Muscle weakness (generalized) (M62.81)                Time: 8832-5498 OT Time Calculation (min): 70 min Charges:  OT General Charges $OT Visit: 1 Visit OT Evaluation $OT Eval Moderate Complexity: 1 Mod OT Treatments $Self Care/Home Management : 38-52 mins  Dessie Coma, M.S. OTR/L  10/23/20, 4:01 PM  ascom [phone]

## 2020-10-23 NOTE — Progress Notes (Signed)
PROGRESS NOTE    Laura Wilcox  WLK:[id number] DOB: 1927-02-16 DOA: 10/22/2020 PCP: Glean Hess, MD   Brief Narrative:  87 y.o. female with a past medical history of polycythemia vera on Jakafi, chronic kidney disease.  The patient presents to the emergency department due to dehydration and lethargy.  The daughter states that she was lethargic this morning.  The daughter lives close by.  The patient lives alone.  She is also confused.  She usually gets like this when she has a urinary tract infection.  Upon EMS arrival her blood pressure was in the 70D systolic.  Was given 500 cc of IV fluids in route.  Usually the patient is independent.  No assistance with ambulation.  Can do her own bills.  Upon my exam she can tell me her name and where she is.  But she cannot tell me the date.  Hemodynamics have improved with fluid resuscitation.   Assessment & Plan:   Active Problems:   Acute metabolic encephalopathy  Acute metabolic encephalopathy secondary to uncomplicated urinary tract infection: Sepsis secondary to above Likely mental status changes driven by UTI Sepsis criteria met with tachypnea leukocytosis Plan: Continue Rocephin Monitor blood and urine cultures Can continue maintenance fluids for today Monitor vitals and fever curve Trend procalcitonin Request therapy evaluations  Acute kidney injury on chronic kidney disease Suspect prerenal azotemia secondary to dehydration in setting of infection Kidney function is improving Continue IV fluids Continue home calcitriol Avoid nephrotoxins   Hypovolemic hyponatremia Plan as above   Hypokalemia Daily renal function, replace as necessary   Polycythemia vera: No acute treatment   DVT prophylaxis: SQ heparin Code Status: Full Family Communication: Family bedside Disposition Plan: Status is: Inpatient  Remains inpatient appropriate because:Inpatient level of care appropriate due to severity of illness  Dispo: The  patient is from: Home              Anticipated d/c is to: Home              Patient currently is not medically stable to d/c.   Difficult to place patient No       Level of care: Med-Surg  Consultants:  None  Procedures: (Don't include imaging studies which can be auto populated. Include things that cannot be auto populated i.e. Echo, Carotid and venous dopplers, Foley, Bipap, HD, tubes/drains, wound vac, central lines etc) None  Antimicrobials: (specify start and planned stop date. Auto populated tables are space occupying and do not give end dates) Ceftriaxone   Subjective: Seen and examined.  Resting in bed.  No visible distress.  Objective: Vitals:   10/22/20 2330 10/23/20 0030 10/23/20 0324 10/23/20 0755  BP: (!) 155/70 119/67 132/60 95/67  Pulse: 89 91 88   Resp: (!) 22 (!) 21 20 19   Temp:   98.3 F (36.8 C) 98.6 F (37 C)  TempSrc:   Oral Oral  SpO2: 97% 95% 95% 93%  Weight:   51 kg   Height:   5' 7"  (1.702 m)     Intake/Output Summary (Last 24 hours) at 10/23/2020 1443 Last data filed at 10/23/2020 1400 Gross per 24 hour  Intake 1351.66 ml  Output --  Net 1351.66 ml   Filed Weights   10/22/20 1712 10/23/20 0324  Weight: 50.3 kg 51 kg    Examination:  General exam: No acute distress.  Appears younger than stated age Respiratory system: Bibasilar crackles.  Normal work of breathing.  Room air Cardiovascular system: S1 &  S2 heard, RRR. No JVD, murmurs, rubs, gallops or clicks. No pedal edema. Gastrointestinal system: Abdomen is nondistended, soft and nontender. No organomegaly or masses felt. Normal bowel sounds heard. Central nervous system: Alert, oriented x2, no focal deficits Extremities: Symmetric 5 x 5 power. Skin: No rashes, lesions or ulcers Psychiatry: Judgement and insight appear impaired. Mood & affect confused.     Data Reviewed: I have personally reviewed following labs and imaging studies  CBC: Recent Labs  Lab 10/22/20 1657  10/23/20 0544  WBC 13.4* 12.2*  NEUTROABS 10.4* 9.3*  HGB 8.4* 8.5*  HCT 25.1* 26.2*  MCV 89.3 90.7  PLT 329 546   Basic Metabolic Panel: Recent Labs  Lab 10/22/20 1657 10/23/20 0544  NA 127* 132*  K 3.1* 3.6  CL 97* 100  CO2 22 24  GLUCOSE 139* 110*  BUN 50* 35*  CREATININE 1.99* 1.67*  CALCIUM 7.9* 8.1*   GFR: Estimated Creatinine Clearance: 17.7 mL/min (A) (by C-G formula based on SCr of 1.67 mg/dL (H)). Liver Function Tests: Recent Labs  Lab 10/22/20 1657  AST 19  ALT 17  ALKPHOS 68  BILITOT 0.8  PROT 5.9*  ALBUMIN 2.7*   No results for input(s): LIPASE, AMYLASE in the last 168 hours. No results for input(s): AMMONIA in the last 168 hours. Coagulation Profile: Recent Labs  Lab 10/22/20 1657  INR 1.1   Cardiac Enzymes: No results for input(s): CKTOTAL, CKMB, CKMBINDEX, TROPONINI in the last 168 hours. BNP (last 3 results) No results for input(s): PROBNP in the last 8760 hours. HbA1C: No results for input(s): HGBA1C in the last 72 hours. CBG: Recent Labs  Lab 10/22/20 2115  GLUCAP 132*   Lipid Profile: No results for input(s): CHOL, HDL, LDLCALC, TRIG, CHOLHDL, LDLDIRECT in the last 72 hours. Thyroid Function Tests: No results for input(s): TSH, T4TOTAL, FREET4, T3FREE, THYROIDAB in the last 72 hours. Anemia Panel: No results for input(s): VITAMINB12, FOLATE, FERRITIN, TIBC, IRON, RETICCTPCT in the last 72 hours. Sepsis Labs: Recent Labs  Lab 10/22/20 1657 10/23/20 0008 10/23/20 0544  PROCALCITON 1.54  --  1.42  LATICACIDVEN 1.2 1.9  --     Recent Results (from the past 240 hour(s))  Blood culture (routine single)     Status: None (Preliminary result)   Collection Time: 10/22/20  4:57 PM   Specimen: BLOOD  Result Value Ref Range Status   Specimen Description BLOOD LEFT ANTECUBITAL  Final   Special Requests   Final    BOTTLES DRAWN AEROBIC AND ANAEROBIC Blood Culture results may not be optimal due to an excessive volume of blood received  in culture bottles   Culture  Setup Time   Final    Organism ID to follow AEROBIC BOTTLE ONLY GRAM NEGATIVE RODS Gram Stain Report Called to,Read Back By and Verified With: Performed at [facility], [address] [place], [place] [address]    Culture GRAM NEGATIVE RODS  Final   Report Status PENDING  Incomplete  Resp Panel by RT-PCR (Flu A&B, Covid) Nasopharyngeal Swab     Status: None   Collection Time: 10/22/20  4:57 PM   Specimen: Nasopharyngeal Swab; Nasopharyngeal(NP) swabs in vial transport medium  Result Value Ref Range Status   SARS Coronavirus 2 by RT PCR NEGATIVE NEGATIVE Final    Comment: (NOTE) SARS-CoV-2 target nucleic acids are NOT DETECTED.  The SARS-CoV-2 RNA is generally detectable in upper respiratory specimens during the acute phase of infection. The lowest concentration of SARS-CoV-2 viral copies this  assay can detect is 138 copies/mL. A negative result does not preclude SARS-Cov-2 infection and should not be used as the sole basis for treatment or other patient management decisions. A negative result may occur with  improper specimen collection/handling, submission of specimen other than nasopharyngeal swab, presence of viral mutation(s) within the areas targeted by this assay, and inadequate number of viral copies(<138 copies/mL). A negative result must be combined with clinical observations, patient history, and epidemiological information. The expected result is Negative.  Fact Sheet for Patients:  EntrepreneurPulse.com.au  Fact Sheet for Healthcare Providers:  IncredibleEmployment.be  This test is no t yet approved or cleared by the [place] FDA and  has been authorized for detection and/or diagnosis of SARS-CoV-2 by FDA under an Emergency Use Authorization (EUA). This EUA will remain  in effect (meaning this test can be used) for the duration of the COVID-19 declaration under Section 564(b)(1) of  the Act, 21 U.S.C.section 360bbb-3(b)(1), unless the authorization is terminated  or revoked sooner.       Influenza A by PCR NEGATIVE NEGATIVE Final   Influenza B by PCR NEGATIVE NEGATIVE Final    Comment: (NOTE) The Xpert Xpress SARS-CoV-2/FLU/RSV plus assay is intended as an aid in the diagnosis of influenza from Nasopharyngeal swab specimens and should not be used as a sole basis for treatment. Nasal washings and aspirates are unacceptable for Xpert Xpress SARS-CoV-2/FLU/RSV testing.  Fact Sheet for Patients: EntrepreneurPulse.com.au  Fact Sheet for Healthcare Providers: IncredibleEmployment.be  This test is not yet approved or cleared by the [place] FDA and has been authorized for detection and/or diagnosis of SARS-CoV-2 by FDA under an Emergency Use Authorization (EUA). This EUA will remain in effect (meaning this test can be used) for the duration of the COVID-19 declaration under Section 564(b)(1) of the Act, 21 U.S.C. section 360bbb-3(b)(1), unless the authorization is terminated or revoked.  Performed at [facility], [address] [place], [place] [address]   Blood culture (routine x 2)     Status: None (Preliminary result)   Collection Time: 10/23/20 12:08 AM   Specimen: BLOOD  Result Value Ref Range Status   Specimen Description BLOOD LEFT ASSIST CONTROL  Final   Special Requests   Final    BOTTLES DRAWN AEROBIC AND ANAEROBIC Blood Culture results may not be optimal due to an inadequate volume of blood received in culture bottles   Culture   Final    NO GROWTH < 12 HOURS Performed at [facility], [address], [place], [place] [address]    Report Status PENDING  Incomplete         Radiology Studies: DG Chest Port 1 View  Result Date: 10/22/2020 CLINICAL DATA:  High fever and dyspnea. EXAM: PORTABLE CHEST 1 VIEW COMPARISON:  10/22/2020 FINDINGS: Shallow inspiration and patient rotation  limits examination. Heart size and pulmonary vascularity are normal for technique. Lungs are clear. No pleural effusions. No pneumothorax. Esophageal hiatal hernia behind the heart. Tortuous aorta. IMPRESSION: No active disease. Electronically Signed   By: Lucienne Capers M.D.   On: 10/22/2020 22:42   DG Chest Port 1 View  Result Date: 10/22/2020 CLINICAL DATA:  Lethargy. Initial hypotension. Possible UTI or sepsis. Anemia. EXAM: PORTABLE CHEST 1 VIEW COMPARISON:  12/17/2018 FINDINGS: The patient is rotated to the right on today's radiograph, reducing diagnostic sensitivity and specificity. Atherosclerotic calcification of the aortic arch. The lungs appear clear. Heart size within normal limits for projection and degree of rotation. No acute bony  findings. No blunting of the costophrenic angles. IMPRESSION: 1. No findings of pneumonia. 2.  Aortic Atherosclerosis (ICD10-I70.0). Electronically Signed   By: Van Clines M.D.   On: 10/22/2020 17:32        Scheduled Meds:  calcitRIOL  0.25 mcg Oral Q M,W,F   heparin  5,000 Units Subcutaneous Q8H   Continuous Infusions:  sodium chloride 100 mL/hr at 10/23/20 1249   cefTRIAXone (ROCEPHIN)  IV       LOS: 1 day    Time spent: 25 minutes    Sidney Ace, MD [facility] Pager [phone]  If 7PM-7AM, please contact night-coverage 10/23/2020, 2:43 PM

## 2020-10-23 NOTE — ED Notes (Signed)
Patient is resting comfortably. 

## 2020-10-23 NOTE — Progress Notes (Signed)
PHARMACY - PHYSICIAN COMMUNICATION CRITICAL VALUE ALERT - BLOOD CULTURE IDENTIFICATION (BCID)  Laura Wilcox is an 87 y.o. female who presented to [facility] on 10/22/2020 with a chief complaint of confusion  Assessment:  Blood culture from 7/24 with E coli currently in 1 of 4 bottles.  Likely urinary source  Name of physician (or Provider) Contacted: Dr Priscella Mann  Current antibiotics: ceftriaxone  Changes to prescribed antibiotics recommended:  Patient is on recommended antibiotics - No changes needed - increase ceftriaxone to 2gm q24h  Results for orders placed or performed during the hospital encounter of 10/22/20  Blood Culture ID Panel (Reflexed) (Collected: 10/22/2020  4:57 PM)  Result Value Ref Range   Enterococcus faecalis NOT DETECTED NOT DETECTED   Enterococcus Faecium NOT DETECTED NOT DETECTED   Listeria monocytogenes NOT DETECTED NOT DETECTED   Staphylococcus species NOT DETECTED NOT DETECTED   Staphylococcus aureus (BCID) NOT DETECTED NOT DETECTED   Staphylococcus epidermidis NOT DETECTED NOT DETECTED   Staphylococcus lugdunensis NOT DETECTED NOT DETECTED   Streptococcus species NOT DETECTED NOT DETECTED   Streptococcus agalactiae NOT DETECTED NOT DETECTED   Streptococcus pneumoniae NOT DETECTED NOT DETECTED   Streptococcus pyogenes NOT DETECTED NOT DETECTED   A.calcoaceticus-baumannii NOT DETECTED NOT DETECTED   Bacteroides fragilis NOT DETECTED NOT DETECTED   Enterobacterales DETECTED (A) NOT DETECTED   Enterobacter cloacae complex NOT DETECTED NOT DETECTED   Escherichia coli DETECTED (A) NOT DETECTED   Klebsiella aerogenes NOT DETECTED NOT DETECTED   Klebsiella oxytoca NOT DETECTED NOT DETECTED   Klebsiella pneumoniae NOT DETECTED NOT DETECTED   Proteus species NOT DETECTED NOT DETECTED   Salmonella species NOT DETECTED NOT DETECTED   Serratia marcescens NOT DETECTED NOT DETECTED   Haemophilus influenzae NOT DETECTED NOT DETECTED   Neisseria meningitidis NOT  DETECTED NOT DETECTED   Pseudomonas aeruginosa NOT DETECTED NOT DETECTED   Stenotrophomonas maltophilia NOT DETECTED NOT DETECTED   Candida albicans NOT DETECTED NOT DETECTED   Candida auris NOT DETECTED NOT DETECTED   Candida glabrata NOT DETECTED NOT DETECTED   Candida krusei NOT DETECTED NOT DETECTED   Candida parapsilosis NOT DETECTED NOT DETECTED   Candida tropicalis NOT DETECTED NOT DETECTED   Cryptococcus neoformans/gattii NOT DETECTED NOT DETECTED   CTX-M ESBL NOT DETECTED NOT DETECTED   Carbapenem resistance IMP NOT DETECTED NOT DETECTED   Carbapenem resistance KPC NOT DETECTED NOT DETECTED   Carbapenem resistance NDM NOT DETECTED NOT DETECTED   Carbapenem resist OXA 48 LIKE NOT DETECTED NOT DETECTED   Carbapenem resistance VIM NOT DETECTED NOT DETECTED   Doreene Eland, PharmD, BCPS.   Work Cell: [phone] 10/23/2020 3:33 PM

## 2020-10-23 NOTE — Evaluation (Signed)
Physical Therapy Evaluation Patient Details Name: Laura Wilcox MRN: [id number] DOB: 1926-04-26 Today's Date: 10/23/2020   History of Present Illness  87 y.o. female with a past medical history of polycythemia vera on Jakafi, chronic kidney disease.  The patient presents to the emergency department due to dehydration and lethargy.  Clinical Impression  Pt is a pleasant 87 year old female who was admitted for acute metabolic encephalopathy. Called into room by OT to assist with incontinent BM. Pt unable to perform self hygiene. New gown donned and pt care addressed. Pt performs transfers and ambulation with min assist +2 for safety. Able to ambulate over to recliner. Pt demonstrates deficits with strength/cognition/mobility/balance. Pt is very high falls risk due to cognition. Is currently not at baseline level. Would benefit from skilled PT to address above deficits and promote optimal return to PLOF; recommend transition to STR upon discharge from acute hospitalization.     Follow Up Recommendations SNF    Equipment Recommendations  Rolling walker with 5" wheels    Recommendations for Other Services       Precautions / Restrictions Precautions Precautions: Fall Restrictions Weight Bearing Restrictions: No      Mobility  Bed Mobility Overal bed mobility: Needs Assistance Bed Mobility: Supine to Sit     Supine to sit: Min guard;HOB elevated          Transfers Overall transfer level: Needs assistance Equipment used: 1 person hand held assist Transfers: Sit to/from Stand Sit to Stand: Min assist;From elevated surface         General transfer comment: once standing, forward flexed posture noted and reaches out for furniture. Ideally would benefit from AD  Ambulation/Gait Ambulation/Gait assistance: Min assist;+2 safety/equipment Gait Distance (Feet): 15 Feet Assistive device: 2 person hand held assist Gait Pattern/deviations: Step-to pattern;Shuffle     General  Gait Details: short shuffle gait pattern with difficulty consistently following commands. Reaching out for furniture during room ambulation.  Stairs            Wheelchair Mobility    Modified Rankin (Stroke Patients Only)       Balance Overall balance assessment: Needs assistance Sitting-balance support: No upper extremity supported;Feet supported Sitting balance-Leahy Scale: Fair     Standing balance support: Bilateral upper extremity supported Standing balance-Leahy Scale: Fair                               Pertinent Vitals/Pain Pain Assessment: Faces Faces Pain Scale: Hurts little more Pain Location: sensitive to touch all over Pain Descriptors / Indicators: Grimacing Pain Intervention(s): Limited activity within patient's tolerance;Repositioned    Home Living Family/patient expects to be discharged to:: Private residence Living Arrangements: Alone Available Help at Discharge: Family;Available PRN/intermittently Type of Home: Apartment Home Access: Level entry     Home Layout: One level Home Equipment: Grab bars - tub/shower      Prior Function Level of Independence: Independent         Comments: Pt's daughter drives pt to appointments/grocery store, assists with bills. No AD at baseline     Hand Dominance   Dominant Hand: Right    Extremity/Trunk Assessment   Upper Extremity Assessment Upper Extremity Assessment: Generalized weakness    Lower Extremity Assessment Lower Extremity Assessment: Generalized weakness (B LE grossly 3+/5)       Communication   Communication: HOH  Cognition Arousal/Alertness: Awake/alert Behavior During Therapy: WFL for tasks assessed/performed;Agitated Overall Cognitive Status: Impaired/Different from  baseline Area of Impairment: Orientation;Following commands                 Orientation Level: Disoriented to;Place;Situation;Time     Following Commands: Follows one step commands  inconsistently       General Comments: Oriented to self consistently. States her age as 61 (incorrect) later correctly states time (per clock). Unable to state location or situation. Pt has STM deficits - does not recall prior complaints she made less than 2 min prior during session      General Comments General comments (skin integrity, edema, etc.): Highest HR 122 during standing    Exercises Other Exercises Other Exercises: Pt and family educated re: OT role, DME recs, d/c recs, falls prevention, ECS, delirium pcns Other Exercises: LBD, toileting, UBD, sup>sit, sit<>stand, sitting/standing balance/tolerance, ~20 ft mobility   Assessment/Plan    PT Assessment Patient needs continued PT services  PT Problem List Decreased strength;Decreased activity tolerance;Decreased balance;Decreased mobility;Decreased safety awareness;Decreased cognition;Decreased knowledge of use of DME       PT Treatment Interventions Gait training;DME instruction;Therapeutic exercise;Balance training    PT Goals (Current goals can be found in the Care Plan section)  Acute Rehab PT Goals Patient Stated Goal: to return to PLOF PT Goal Formulation: With patient Time For Goal Achievement: 11/06/20 Potential to Achieve Goals: Good    Frequency Min 2X/week   Barriers to discharge        Co-evaluation PT/OT/SLP Co-Evaluation/Treatment: Yes Reason for Co-Treatment: Necessary to address cognition/behavior during functional activity;For patient/therapist safety PT goals addressed during session: Mobility/safety with mobility;Balance OT goals addressed during session: ADL's and self-care       AM-PAC PT "6 Clicks" Mobility  Outcome Measure Help needed turning from your back to your side while in a flat bed without using bedrails?: A Little Help needed moving from lying on your back to sitting on the side of a flat bed without using bedrails?: A Little Help needed moving to and from a bed to a chair  (including a wheelchair)?: A Little Help needed standing up from a chair using your arms (e.g., wheelchair or bedside chair)?: A Little Help needed to walk in hospital room?: A Little Help needed climbing 3-5 steps with a railing? : A Little 6 Click Score: 18    End of Session   Activity Tolerance: Patient tolerated treatment well Patient left: in chair;with chair alarm set;with family/visitor present Nurse Communication: Mobility status PT Visit Diagnosis: Unsteadiness on feet (R26.81);Muscle weakness (generalized) (M62.81);Difficulty in walking, not elsewhere classified (R26.2)    Time: 9458-5929 PT Time Calculation (min) (ACUTE ONLY): 42 min   Charges:   PT Evaluation $PT Eval Moderate Complexity: 1 Mod PT Treatments $Therapeutic Activity: 8-22 mins        Greggory Stallion, PT, DPT [phone]   Laura Wilcox 10/23/2020, 4:33 PM

## 2020-10-24 DIAGNOSIS — G9341 Metabolic encephalopathy: Secondary | ICD-10-CM | POA: Diagnosis not present

## 2020-10-24 LAB — BASIC METABOLIC PANEL
Anion gap: 10 (ref 5–15)
BUN: 28 mg/dL — ABNORMAL HIGH (ref 8–23)
CO2: 21 mmol/L — ABNORMAL LOW (ref 22–32)
Calcium: 8 mg/dL — ABNORMAL LOW (ref 8.9–10.3)
Chloride: 105 mmol/L (ref 98–111)
Creatinine, Ser: 1.51 mg/dL — ABNORMAL HIGH (ref 0.44–1.00)
GFR, Estimated: 32 mL/min — ABNORMAL LOW (ref >60)
Glucose, Bld: 94 mg/dL (ref 70–99)
Potassium: 3 mmol/L — ABNORMAL LOW (ref 3.5–5.1)
Sodium: 136 mmol/L (ref 135–145)

## 2020-10-24 LAB — CBC WITH DIFFERENTIAL/PLATELET
Abs Immature Granulocytes: 1 10*3/uL — ABNORMAL HIGH (ref 0.00–0.07)
Basophils Absolute: 0.1 10*3/uL (ref 0.0–0.1)
Basophils Relative: 1 %
Eosinophils Absolute: 0.3 10*3/uL (ref 0.0–0.5)
Eosinophils Relative: 3 %
HCT: 23.4 % — ABNORMAL LOW (ref 36.0–46.0)
Hemoglobin: 7.7 g/dL — ABNORMAL LOW (ref 12.0–15.0)
Immature Granulocytes: 10 %
Lymphocytes Relative: 6 %
Lymphs Abs: 0.6 10*3/uL — ABNORMAL LOW (ref 0.7–4.0)
MCH: 30 pg (ref 26.0–34.0)
MCHC: 32.9 g/dL (ref 30.0–36.0)
MCV: 91.1 fL (ref 80.0–100.0)
Monocytes Absolute: 1.1 10*3/uL — ABNORMAL HIGH (ref 0.1–1.0)
Monocytes Relative: 11 %
Neutro Abs: 7 10*3/uL (ref 1.7–7.7)
Neutrophils Relative %: 69 %
Platelets: 341 10*3/uL (ref 150–400)
RBC: 2.57 MIL/uL — ABNORMAL LOW (ref 3.87–5.11)
RDW: 22.1 % — ABNORMAL HIGH (ref 11.5–15.5)
Smear Review: NORMAL
WBC Morphology: ABNORMAL
WBC: 10 10*3/uL (ref 4.0–10.5)
nRBC: 0 % (ref 0.0–0.2)

## 2020-10-24 LAB — URINE CULTURE

## 2020-10-24 LAB — PROCALCITONIN: Procalcitonin: 0.9 ng/mL

## 2020-10-24 MED ORDER — POTASSIUM CHLORIDE CRYS ER 20 MEQ PO TBCR
40.0000 meq | EXTENDED_RELEASE_TABLET | ORAL | Status: AC
  Administered 2020-10-24 (×2): 40 meq via ORAL
  Filled 2020-10-24 (×2): qty 2

## 2020-10-24 NOTE — Progress Notes (Signed)
PROGRESS NOTE    Laura Wilcox  JZP:[id number] DOB: 16-Apr-1926 DOA: 10/22/2020 PCP: Glean Hess, MD   Brief Narrative:  87 y.o. female with a past medical history of polycythemia vera on Jakafi, chronic kidney disease.  The patient presents to the emergency department due to dehydration and lethargy.  The daughter states that she was lethargic this morning.  The daughter lives close by.  The patient lives alone.  She is also confused.  She usually gets like this when she has a urinary tract infection.  Upon EMS arrival her blood pressure was in the 48A systolic.  Was given 500 cc of IV fluids in route.  Usually the patient is independent.  No assistance with ambulation.  Can do her own bills.  Upon my exam she can tell me her name and where she is.  But she cannot tell me the date.  Hemodynamics have improved with fluid resuscitation.  Blood cultures positive for E. coli   Assessment & Plan:   Active Problems:   Acute metabolic encephalopathy  Acute metabolic encephalopathy secondary to uncomplicated urinary tract infection: E. coli bacteremia secondary to above Sepsis secondary to above Likely mental status changes driven by UTI and associated bacteremia Sepsis criteria met with tachypnea leukocytosis Blood cultures with E. coli, sensitivities pending as of 7/26 Plan: Continue Rocephin, dose increased to 2 mg daily Monitor blood and urine cultures for sensitivities  Continue maintenance fluids for now until kidney function normalized Monitor vitals and fever curve Continue therapy evaluations.  Therapy is recommended skilled nursing facility however patient would like reevaluation prior to deciding home versus facility  Acute kidney injury on chronic kidney disease Suspect prerenal azotemia secondary to dehydration in setting of infection Kidney function is improving Plan: Continue IV fluids for today Continue home calcitriol Recheck kidney function in a.m.    Hypovolemic hyponatremia Plan as above. Improving    Hypokalemia Daily renal function, replace as necessary   Polycythemia vera: No acute treatment   DVT prophylaxis: SQ heparin Code Status: Full Family Communication: Daughter at bedside 7/26 Disposition Plan: Status is: Inpatient  Remains inpatient appropriate because:Inpatient level of care appropriate due to severity of illness  Dispo: The patient is from: Home              Anticipated d/c is to: Home versus STR              Patient currently is not medically stable to d/c.   Difficult to place patient No  Improving sepsis and associated lethargy and confusion.  E. coli bacteremia secondary to UTI.  Anticipated date of discharge 7/27.  Home versus STR       Level of care: Med-Surg  Consultants:  None  Procedures:  None  Antimicrobials:  Ceftriaxone   Subjective: Seen and examined.  Sitting up in the chair.  Daughter at bedside.  No visible distress.  No complaints.  Tolerating p.o.  Strength is improving.  Objective: Vitals:   10/23/20 2018 10/24/20 0531 10/24/20 0720 10/24/20 1202  BP: (!) 162/76 138/66 140/66 (!) 137/93  Pulse: 86 81 75 89  Resp: _0 Temp: (!) 97.4 F (36.3 C)  98.1 F (36.7 C) 98.1 F (36.7 C)  TempSrc: Oral   Oral  SpO2: 92% 95% 96% 92%  Weight:      Height:        Intake/Output Summary (Last 24 hours) at 10/24/2020 1214 Last data filed at 10/23/2020 1400 Gross per 24 hour  Intake 200 ml  Output --  Net 200 ml   Filed Weights   10/22/20 1712 10/23/20 0324  Weight: 50.3 kg 51 kg    Examination:  General exam: No acute distress.  Appears younger than stated age Respiratory system: Lungs clear.  Normal work of breathing.  Room air Cardiovascular system: S1-S2, regular rate and rhythm, no murmurs, no pedal edema  gastrointestinal system: Abdomen is nondistended, soft and nontender. No organomegaly or masses felt. Normal bowel sounds heard. Central nervous  system: Alert, oriented x2, no focal deficits Extremities: Symmetric 5 x 5 power. Skin: No rashes, lesions or ulcers Psychiatry: Judgement and insight appear impaired. Mood & affect confused.     Data Reviewed: I have personally reviewed following labs and imaging studies  CBC: Recent Labs  Lab 10/22/20 1657 10/23/20 0544 10/24/20 0448  WBC 13.4* 12.2* 10.0  NEUTROABS 10.4* 9.3* 7.0  HGB 8.4* 8.5* 7.7*  HCT 25.1* 26.2* 23.4*  MCV 89.3 90.7 91.1  PLT 329 367 956   Basic Metabolic Panel: Recent Labs  Lab 10/22/20 1657 10/23/20 0544 10/24/20 0448  NA 127* 132* 136  K 3.1* 3.6 3.0*  CL 97* 100 105  CO2 22 24 21*  GLUCOSE 139* 110* 94  BUN 50* 35* 28*  CREATININE 1.99* 1.67* 1.51*  CALCIUM 7.9* 8.1* 8.0*   GFR: Estimated Creatinine Clearance: 19.5 mL/min (A) (by C-G formula based on SCr of 1.51 mg/dL (H)). Liver Function Tests: Recent Labs  Lab 10/22/20 1657  AST 19  ALT 17  ALKPHOS 68  BILITOT 0.8  PROT 5.9*  ALBUMIN 2.7*   No results for input(s): LIPASE, AMYLASE in the last 168 hours. No results for input(s): AMMONIA in the last 168 hours. Coagulation Profile: Recent Labs  Lab 10/22/20 1657  INR 1.1   Cardiac Enzymes: No results for input(s): CKTOTAL, CKMB, CKMBINDEX, TROPONINI in the last 168 hours. BNP (last 3 results) No results for input(s): PROBNP in the last 8760 hours. HbA1C: No results for input(s): HGBA1C in the last 72 hours. CBG: Recent Labs  Lab 10/22/20 2115  GLUCAP 132*   Lipid Profile: No results for input(s): CHOL, HDL, LDLCALC, TRIG, CHOLHDL, LDLDIRECT in the last 72 hours. Thyroid Function Tests: No results for input(s): TSH, T4TOTAL, FREET4, T3FREE, THYROIDAB in the last 72 hours. Anemia Panel: No results for input(s): VITAMINB12, FOLATE, FERRITIN, TIBC, IRON, RETICCTPCT in the last 72 hours. Sepsis Labs: Recent Labs  Lab 10/22/20 1657 10/23/20 0008 10/23/20 0544 10/24/20 0448  PROCALCITON 1.54  --  1.42 0.90   LATICACIDVEN 1.2 1.9  --   --     Recent Results (from the past 240 hour(s))  Blood culture (routine single)     Status: Abnormal (Preliminary result)   Collection Time: 10/22/20  4:57 PM   Specimen: BLOOD  Result Value Ref Range Status   Specimen Description   Final    BLOOD LEFT ANTECUBITAL Performed at [facility], [address] [place], [place] [address]    Special Requests   Final    BOTTLES DRAWN AEROBIC AND ANAEROBIC Blood Culture results may not be optimal due to an excessive volume of blood received in culture bottles Performed at [facility] [address] [place], [place] [address]    Culture  Setup Time   Final    Organism ID to follow [place]kin Gram Stain Report Called to,Read Back By and Verified With: [place]. 1430. 10/23/2020 GAA Performed at [facility], [address], [place], [place] [address]    Culture (A)  Final    ESCHERICHIA COLI SUSCEPTIBILITIES TO FOLLOW Performed at [facility], [place] [address] [place], [place] [address]    Report Status PENDING  Incomplete  Resp Panel by RT-PCR (Flu A&B, Covid) Nasopharyngeal Swab     Status: None   Collection Time: 10/22/20  4:57 PM   Specimen: Nasopharyngeal Swab; Nasopharyngeal(NP) swabs in vial transport medium  Result Value Ref Range Status   SARS Coronavirus 2 by RT PCR NEGATIVE NEGATIVE Final    Comment: (NOTE) SARS-CoV-2 target nucleic acids are NOT DETECTED.  The SARS-CoV-2 RNA is generally detectable in upper respiratory specimens during the acute phase of infection. The lowest concentration of SARS-CoV-2 viral copies this assay can detect is 138 copies/mL. A negative result does not preclude SARS-Cov-2 infection and should not be used as the sole basis for treatment or other patient management decisions. A negative result may occur with  improper specimen collection/handling, submission of specimen other than  nasopharyngeal swab, presence of viral mutation(s) within the areas targeted by this assay, and inadequate number of viral copies(<138 copies/mL). A negative result must be combined with clinical observations, patient history, and epidemiological information. The expected result is Negative.  Fact Sheet for Patients:  EntrepreneurPulse.com.au  Fact Sheet for Healthcare Providers:  IncredibleEmployment.be  This test is no t yet approved or cleared by the [place] FDA and  has been authorized for detection and/or diagnosis of SARS-CoV-2 by FDA under an Emergency Use Authorization (EUA). This EUA will remain  in effect (meaning this test can be used) for the duration of the COVID-19 declaration under Section 564(b)(1) of the Act, 21 U.S.C.section 360bbb-3(b)(1), unless the authorization is terminated  or revoked sooner.       Influenza A by PCR NEGATIVE NEGATIVE Final   Influenza B by PCR NEGATIVE NEGATIVE Final    Comment: (NOTE) The Xpert Xpress SARS-CoV-2/FLU/RSV plus assay is intended as an aid in the diagnosis of influenza from Nasopharyngeal swab specimens and should not be used as a sole basis for treatment. Nasal washings and aspirates are unacceptable for Xpert Xpress SARS-CoV-2/FLU/RSV testing.  Fact Sheet for Patients: EntrepreneurPulse.com.au  Fact Sheet for Healthcare Providers: IncredibleEmployment.be  This test is not yet approved or cleared by the [place] FDA and has been authorized for detection and/or diagnosis of SARS-CoV-2 by FDA under an Emergency Use Authorization (EUA). This EUA will remain in effect (meaning this test can be used) for the duration of the COVID-19 declaration under Section 564(b)(1) of the Act, 21 U.S.C. section 360bbb-3(b)(1), unless the authorization is terminated or revoked.  Performed at [facility], [address] [place], [place]  [address]   Blood Culture ID Panel (Reflexed)     Status: Abnormal   Collection Time: 10/22/20  4:57 PM  Result Value Ref Range Status   Enterococcus faecalis NOT DETECTED NOT DETECTED Final   Enterococcus Faecium NOT DETECTED NOT DETECTED Final   Listeria monocytogenes NOT DETECTED NOT DETECTED Final   Staphylococcus species NOT DETECTED NOT DETECTED Final   Staphylococcus aureus (BCID) NOT DETECTED NOT DETECTED Final   Staphylococcus epidermidis NOT DETECTED NOT DETECTED Final   Staphylococcus lugdunensis NOT DETECTED NOT DETECTED Final   Streptococcus species NOT DETECTED NOT DETECTED Final   Streptococcus agalactiae NOT DETECTED NOT DETECTED Final   Streptococcus pneumoniae NOT DETECTED NOT DETECTED Final   Streptococcus pyogenes NOT DETECTED NOT DETECTED Final   A.calcoaceticus-baumannii  NOT DETECTED NOT DETECTED Final   Bacteroides fragilis NOT DETECTED NOT DETECTED Final   Enterobacterales DETECTED (A) NOT DETECTED Final    Comment: Enterobacterales represent a large order of gram negative bacteria, not a single organism. CRITICAL RESULT CALLED TO, READ BACK BY AND VERIFIED WITH:  [place]. 1430 10/23/2020. GAA    Enterobacter cloacae complex NOT DETECTED NOT DETECTED Final   Escherichia coli DETECTED (A) NOT DETECTED Final    Comment: CRITICAL RESULT CALLED TO, READ BACK BY AND VERIFIED WITH:  SUSAN WATSON PHARMD. 1430. 10/23/2020    Klebsiella aerogenes NOT DETECTED NOT DETECTED Final   Klebsiella oxytoca NOT DETECTED NOT DETECTED Final   Klebsiella pneumoniae NOT DETECTED NOT DETECTED Final   Proteus species NOT DETECTED NOT DETECTED Final   Salmonella species NOT DETECTED NOT DETECTED Final   Serratia marcescens NOT DETECTED NOT DETECTED Final   Haemophilus influenzae NOT DETECTED NOT DETECTED Final   Neisseria meningitidis NOT DETECTED NOT DETECTED Final   Pseudomonas aeruginosa NOT DETECTED NOT DETECTED Final   Stenotrophomonas maltophilia NOT DETECTED NOT  DETECTED Final   Candida albicans NOT DETECTED NOT DETECTED Final   Candida auris NOT DETECTED NOT DETECTED Final   Candida glabrata NOT DETECTED NOT DETECTED Final   Candida krusei NOT DETECTED NOT DETECTED Final   Candida parapsilosis NOT DETECTED NOT DETECTED Final   Candida tropicalis NOT DETECTED NOT DETECTED Final   Cryptococcus neoformans/gattii NOT DETECTED NOT DETECTED Final   CTX-M ESBL NOT DETECTED NOT DETECTED Final   Carbapenem resistance IMP NOT DETECTED NOT DETECTED Final   Carbapenem resistance KPC NOT DETECTED NOT DETECTED Final   Carbapenem resistance NDM NOT DETECTED NOT DETECTED Final   Carbapenem resist OXA 48 LIKE NOT DETECTED NOT DETECTED Final   Carbapenem resistance VIM NOT DETECTED NOT DETECTED Final    Comment: Performed at [facility], [address] [place], [place] [address]  Urine Culture     Status: Abnormal   Collection Time: 10/22/20  6:50 PM   Specimen: In/Out Cath Urine  Result Value Ref Range Status   Specimen Description   Final    IN/OUT CATH URINE Performed at [name], [address] [place], [place] [address]    Special Requests   Final    NONE Performed at [facility], [address] [place], [place] [address]    Culture MULTIPLE SPECIES PRESENT, SUGGEST RECOLLECTION (A)  Final   Report Status 10/24/2020 FINAL  Final  Blood culture (routine x 2)     Status: None (Preliminary result)   Collection Time: 10/23/20 12:08 AM   Specimen: BLOOD  Result Value Ref Range Status   Specimen Description BLOOD LEFT ASSIST CONTROL  Final   Special Requests   Final    BOTTLES DRAWN AEROBIC AND ANAEROBIC Blood Culture results may not be optimal due to an inadequate volume of blood received in culture bottles   Culture   Final    NO GROWTH 1 DAY Performed at [facility], [address], [place], [place] [address]    Report Status PENDING  Incomplete         Radiology Studies: DG Chest Port 1  View  Result Date: 10/22/2020 CLINICAL DATA:  High fever and dyspnea. EXAM: PORTABLE CHEST 1 VIEW COMPARISON:  10/22/2020 FINDINGS: Shallow inspiration and patient rotation limits examination. Heart size and pulmonary vascularity are normal for technique. Lungs are clear. No pleural effusions. No pneumothorax. Esophageal hiatal hernia behind the heart. Tortuous  aorta. IMPRESSION: No active disease. Electronically Signed   By: Lucienne Capers M.D.   On: 10/22/2020 22:42   DG Chest Port 1 View  Result Date: 10/22/2020 CLINICAL DATA:  Lethargy. Initial hypotension. Possible UTI or sepsis. Anemia. EXAM: PORTABLE CHEST 1 VIEW COMPARISON:  12/17/2018 FINDINGS: The patient is rotated to the right on today's radiograph, reducing diagnostic sensitivity and specificity. Atherosclerotic calcification of the aortic arch. The lungs appear clear. Heart size within normal limits for projection and degree of rotation. No acute bony findings. No blunting of the costophrenic angles. IMPRESSION: 1. No findings of pneumonia. 2.  Aortic Atherosclerosis (ICD10-I70.0). Electronically Signed   By: Van Clines M.D.   On: 10/22/2020 17:32        Scheduled Meds:  calcitRIOL  0.25 mcg Oral Q M,W,F   heparin  5,000 Units Subcutaneous Q8H   potassium chloride  40 mEq Oral Q2H   Continuous Infusions:  sodium chloride 100 mL/hr at 10/23/20 2243   cefTRIAXone (ROCEPHIN)  IV 2 g (10/23/20 1801)     LOS: 2 days    Time spent: 25 minutes    Sidney Ace, MD [facility] Pager [phone]  If 7PM-7AM, please contact night-coverage 10/24/2020, 12:14 PM

## 2020-10-24 NOTE — Progress Notes (Signed)
Occupational Therapy Treatment Patient Details Name: Laura Wilcox MRN: [id number] DOB: 18-Jul-1926 Today's Date: 10/24/2020    History of present illness 87 y.o. female with a past medical history of polycythemia vera on Jakafi, chronic kidney disease.  The patient presents to the emergency department due to dehydration and lethargy.   OT comments  [name] was seen for OT treatment on this date. Upon arrival to room pt sleeping in bed, daughter at bedside with questions re: DME and home safety. Extensive education given for adapted bathing strategies and home/routines modifications. Pt demonstrates improved safety awareness but continues to require prompting and will require supervision at discharge. SBA + RW for toilet t/f and perihygiene/clothing mgmt. SUPERVISION hand washing standing sinkside. MIN cues to encourage pt to discard soiled briefs. Pt making good progress toward goals. Pt continues to benefit from skilled OT services to maximize return to PLOF and minimize risk of future falls, injury, caregiver burden, and readmission. Will continue to follow POC. Discharge recommendation updated to reflect progress.    Follow Up Recommendations  Home health OT;Supervision/Assistance - 24 hour (initial 24/7 sup)    Equipment Recommendations  3 in 1 bedside commode;Other (comment) (tub transfer bench)    Recommendations for Other Services      Precautions / Restrictions Precautions Precautions: Fall Restrictions Weight Bearing Restrictions: No       Mobility Bed Mobility Overal bed mobility: Needs Assistance Bed Mobility: Supine to Sit     Supine to sit: Min guard;HOB elevated          Transfers Overall transfer level: Needs assistance Equipment used: None Transfers: Sit to/from Stand Sit to Stand: Supervision         General transfer comment: SBA c rails    Balance Overall balance assessment: Needs assistance Sitting-balance support: No upper extremity  supported;Feet supported Sitting balance-Leahy Scale: Good     Standing balance support: No upper extremity supported;During functional activity Standing balance-Leahy Scale: Good                             ADL either performed or assessed with clinical judgement   ADL Overall ADL's : Needs assistance/impaired                                       General ADL Comments: SBA + RW for toilet t/f and perihygiene/clothing mgmt. SUPERVISION hand washing standing sinkside. MIN cues to encourage pt to discard soiled briefs.      Cognition Arousal/Alertness: Awake/alert Behavior During Therapy: WFL for tasks assessed/performed;Agitated Overall Cognitive Status: Impaired/Different from baseline Area of Impairment: Orientation;Following commands                 Orientation Level: Disoriented to;Place;Situation     Following Commands: Follows one step commands with increased time                Exercises Exercises: Other exercises Other Exercises Other Exercises: Pt and family educated re: DME recs, home/routines modifications, adapted shower strategies, d/c recs, falls prevention, ECS Other Exercises: LBD, toileting, UBD, sup>sit, sit<>stand, sitting/standing balance/tolerance, ~50 ft mobility           Pertinent Vitals/ Pain       Pain Assessment: No/denies pain   Frequency  Min 2X/week        Progress Toward Goals  OT Goals(current goals can now be  found in the care plan section)  Progress towards OT goals: Progressing toward goals  Acute Rehab OT Goals Patient Stated Goal: to return to PLOF OT Goal Formulation: With patient/family Time For Goal Achievement: 11/06/20 Potential to Achieve Goals: Good ADL Goals Pt Will Perform Grooming: standing;with supervision Pt Will Perform Lower Body Dressing: with modified independence;sit to/from stand Pt Will Transfer to Toilet: with modified independence;ambulating;regular height  toilet  Plan Discharge plan needs to be updated;Frequency remains appropriate       AM-PAC OT "6 Clicks" Daily Activity     Outcome Measure   Help from another person eating meals?: None Help from another person taking care of personal grooming?: None Help from another person toileting, which includes using toliet, bedpan, or urinal?: A Little Help from another person bathing (including washing, rinsing, drying)?: A Little Help from another person to put on and taking off regular upper body clothing?: None Help from another person to put on and taking off regular lower body clothing?: A Little 6 Click Score: 21    End of Session Equipment Utilized During Treatment: Rolling walker  OT Visit Diagnosis: Muscle weakness (generalized) (M62.81)   Activity Tolerance Patient tolerated treatment well   Patient Left in chair;with call bell/phone within reach;with chair alarm set;with nursing/sitter in room;with family/visitor present   Nurse Communication Mobility status        Time: 5686-1683 OT Time Calculation (min): 39 min  Charges: OT General Charges $OT Visit: 1 Visit OT Treatments $Self Care/Home Management : 38-52 mins  Dessie Coma, M.S. OTR/L  10/24/20, 4:33 PM  ascom [phone]

## 2020-10-24 NOTE — TOC Initial Note (Signed)
Transition of Care [facility]) - Initial/Assessment Note    Patient Details  Name: Laura Wilcox MRN: [id number] Date of Birth: 19-Jan-1927  [facility] CM/SW Contact:    Su Hilt, RN Phone Number: 10/24/2020, 9:48 AM  Clinical Narrative:                    Met with the patient and her daughter in the room, The patient is more alert and oriented, She does live alone in an apartment with her daughter as her only caregiver.  The patient was tired and wanted a nap, She was able to get out of the chair and get back into the bed independently.  She would like to work with PT again prior to making a decision about going to STR     Patient Goals and CMS Choice        Expected Discharge Plan and Services                                                Prior Living Arrangements/Services                       Activities of Daily Living Home Assistive Devices/Equipment: Eyeglasses ADL Screening (condition at time of admission) Patient's cognitive ability adequate to safely complete daily activities?: Yes Is the patient deaf or have difficulty hearing?: Yes Does the patient have difficulty seeing, even when wearing glasses/contacts?: No Does the patient have difficulty concentrating, remembering, or making decisions?: No Patient able to express need for assistance with ADLs?: Yes Does the patient have difficulty dressing or bathing?: No Independently performs ADLs?: Yes (appropriate for developmental age) Does the patient have difficulty walking or climbing stairs?: Yes Weakness of Legs: Both Weakness of Arms/Hands: Both  Permission Sought/Granted                  Emotional Assessment              Admission diagnosis:  Dyspnea [R06.00] Sepsis secondary to UTI ([place]) [A41.9, V37.1] Acute metabolic encephalopathy [G62.69] Patient Active Problem List   Diagnosis Date Noted   Acute metabolic encephalopathy 48/54/6270   PCP:   Glean Hess, MD Pharmacy:   [facility] [phone] [address], [place] [address] Phone: [phone] Fax: [phone]     Social Determinants of Health (SDOH) Interventions    Readmission Risk Interventions No flowsheet data found.

## 2020-10-25 ENCOUNTER — Other Ambulatory Visit: Payer: Self-pay

## 2020-10-25 DIAGNOSIS — N1832 Chronic kidney disease, stage 3b: Secondary | ICD-10-CM

## 2020-10-25 DIAGNOSIS — G9341 Metabolic encephalopathy: Secondary | ICD-10-CM | POA: Diagnosis not present

## 2020-10-25 DIAGNOSIS — D631 Anemia in chronic kidney disease: Secondary | ICD-10-CM

## 2020-10-25 LAB — BASIC METABOLIC PANEL
Anion gap: 9 (ref 5–15)
BUN: 24 mg/dL — ABNORMAL HIGH (ref 8–23)
CO2: 22 mmol/L (ref 22–32)
Calcium: 8.5 mg/dL — ABNORMAL LOW (ref 8.9–10.3)
Chloride: 108 mmol/L (ref 98–111)
Creatinine, Ser: 1.45 mg/dL — ABNORMAL HIGH (ref 0.44–1.00)
GFR, Estimated: 34 mL/min — ABNORMAL LOW (ref >60)
Glucose, Bld: 96 mg/dL (ref 70–99)
Potassium: 4.1 mmol/L (ref 3.5–5.1)
Sodium: 139 mmol/L (ref 135–145)

## 2020-10-25 LAB — CBC WITH DIFFERENTIAL/PLATELET
Abs Immature Granulocytes: 0.4 10*3/uL — ABNORMAL HIGH (ref 0.00–0.07)
Band Neutrophils: 20 %
Basophils Absolute: 0 10*3/uL (ref 0.0–0.1)
Basophils Relative: 0 %
Eosinophils Absolute: 1.4 10*3/uL — ABNORMAL HIGH (ref 0.0–0.5)
Eosinophils Relative: 12 %
HCT: 26.8 % — ABNORMAL LOW (ref 36.0–46.0)
Hemoglobin: 8.5 g/dL — ABNORMAL LOW (ref 12.0–15.0)
Lymphocytes Relative: 16 %
Lymphs Abs: 1.9 10*3/uL (ref 0.7–4.0)
MCH: 29.2 pg (ref 26.0–34.0)
MCHC: 31.7 g/dL (ref 30.0–36.0)
MCV: 92.1 fL (ref 80.0–100.0)
Monocytes Absolute: 0.6 10*3/uL (ref 0.1–1.0)
Monocytes Relative: 5 %
Myelocytes: 3 %
Neutro Abs: 7.6 10*3/uL (ref 1.7–7.7)
Neutrophils Relative %: 44 %
Platelets: 412 10*3/uL — ABNORMAL HIGH (ref 150–400)
RBC: 2.91 MIL/uL — ABNORMAL LOW (ref 3.87–5.11)
RDW: 22.3 % — ABNORMAL HIGH (ref 11.5–15.5)
WBC: 11.9 10*3/uL — ABNORMAL HIGH (ref 4.0–10.5)
nRBC: 0 % (ref 0.0–0.2)

## 2020-10-25 LAB — RESP PANEL BY RT-PCR (FLU A&B, COVID) ARPGX2
Influenza A by PCR: NEGATIVE
Influenza B by PCR: NEGATIVE
SARS Coronavirus 2 by RT PCR: NEGATIVE

## 2020-10-25 MED ORDER — CIPROFLOXACIN HCL 500 MG PO TABS
500.0000 mg | ORAL_TABLET | Freq: Every day | ORAL | Status: DC
  Administered 2020-10-25 – 2020-10-26 (×2): 500 mg via ORAL
  Filled 2020-10-25 (×2): qty 1

## 2020-10-25 MED ORDER — SODIUM CHLORIDE 0.9 % IV BOLUS: 500.0000 mL | Freq: Once | INTRAVENOUS | Status: DC

## 2020-10-25 MED ORDER — ENSURE ENLIVE PO LIQD
237.0000 mL | Freq: Two times a day (BID) | ORAL | Status: DC
  Administered 2020-10-26 (×2): 237 mL via ORAL

## 2020-10-25 MED ORDER — RISAQUAD PO CAPS
1.0000 | ORAL_CAPSULE | Freq: Every day | ORAL | Status: DC
  Administered 2020-10-25 – 2020-10-26 (×2): 1 via ORAL
  Filled 2020-10-25 (×2): qty 1

## 2020-10-25 NOTE — Care Management Important Message (Signed)
Important Message  Patient Details  Name: Laura Wilcox MRN: [id number] Date of Birth: 12-27-1926   Medicare Important Message Given:  Yes     Juliann Pulse A Natlie Asfour 10/25/2020, 10:24 AM

## 2020-10-25 NOTE — Progress Notes (Signed)
PROGRESS NOTE    Laura Wilcox  AVW:[id number] DOB: 1926-08-27 DOA: 10/22/2020 PCP: Glean Hess, MD   Brief Narrative:  87 y.o. female with a past medical history of polycythemia vera on Jakafi, chronic kidney disease.  The patient presents to the emergency department due to dehydration and lethargy.  The daughter states that she was lethargic this morning.  The daughter lives close by.  The patient lives alone.  She is also confused.  She usually gets like this when she has a urinary tract infection.  Upon EMS arrival her blood pressure was in the 53Z systolic.  Was given 500 cc of IV fluids in route.  Usually the patient is independent.  No assistance with ambulation.  Can do her own bills.  Upon my exam she can tell me her name and where she is.  But she cannot tell me the date.  Hemodynamics have improved with fluid resuscitation.  Blood cultures positive for E. Coli.  E. coli pansensitive.  Transition oral antibiotics on 7/27.  Patient is medically stable for discharge at this time however she remains significantly weak and deconditioned.  Likely will benefit from skilled nursing facility at time of discharge.  Is reluctant but after several conversations with myself and the case manager they have agreed to skilled nursing facility.  Bed search in progress.   Assessment & Plan:   Active Problems:   Acute metabolic encephalopathy  Acute metabolic encephalopathy secondary to uncomplicated urinary tract infection: E. coli bacteremia secondary to above Sepsis secondary to above Likely mental status changes driven by UTI and associated bacteremia Sepsis criteria met with tachypnea leukocytosis Blood cultures with E. coli, pansensitive Plan: Discontinue IV Rocephin Start ciprofloxacin, pharmacy dosing.  500 mg daily x7 total days Monitor blood and urine cultures for sensitivities  Discontinue IV fluids Monitor vitals and fever curve Plan for skilled nursing facility at time of  discharge Medically stable, bed search in progress  Acute kidney injury on chronic kidney disease Suspect prerenal azotemia secondary to dehydration in setting of infection Kidney function is improving Plan: Discontinue IV fluids Continue home calcitriol   Hypovolemic hyponatremia, resolved Plan as above.     Hypokalemia Improved   Polycythemia vera: No acute treatment   DVT prophylaxis: SQ heparin Code Status: Full Family Communication: Daughter at bedside 7/27 Disposition Plan: Status is: Inpatient  Remains inpatient appropriate because:  Dispo: The patient is from: Home              Anticipated d/c is to: Home versus STR              Patient currently is medically stable for discharge   Difficult to place patient No  Patient is medically stable for discharge however remains significantly weak and deconditioned.  Will benefit from skilled nursing facility at time of discharge.  Is reviewing bed offers with her family.  Anticipated date of discharge is 7/28.       Level of care: Med-Surg  Consultants:  None  Procedures:  None  Antimicrobials:  Ceftriaxone   Subjective: Patient seen and examined.  Sitting up in chair.  Appears very fatigued.  Upset about possibility of skilled nursing facility.  Strength is improving albeit slowly  Objective: Vitals:   10/24/20 1941 10/24/20 2354 10/25/20 0425 10/25/20 0859  BP: 128/74 (!) 168/70 104/69 115/72  Pulse: 89 77 78 80  Resp: _0 Temp: 97.8 F (36.6 C) 97.7 F (36.5 C) 98 F (36.7 C) 98.1  F (36.7 C)  TempSrc: Oral Oral Oral   SpO2: 98% 96% 97% 97%  Weight:      Height:        Intake/Output Summary (Last 24 hours) at 10/25/2020 1503 Last data filed at 10/25/2020 1000 Gross per 24 hour  Intake 2273.61 ml  Output 600 ml  Net 1673.61 ml   Filed Weights   10/22/20 1712 10/23/20 0324  Weight: 50.3 kg 51 kg    Examination:  General exam: No acute distress.  Appears fatigued Respiratory  system: Lungs clear.  Normal work of breathing.  Room air Cardiovascular system: S1-S2, regular rate and rhythm, no murmurs, no pedal edema  gastrointestinal system: Soft, nontender, nondistended, normal bowel sounds Central nervous system: Alert, oriented x2, no focal deficits Extremities: Symmetric 5 x 5 power. Skin: No rashes, lesions or ulcers Psychiatry: Judgement and insight appear impaired. Mood & affect confused.     Data Reviewed: I have personally reviewed following labs and imaging studies  CBC: Recent Labs  Lab 10/22/20 1657 10/23/20 0544 10/24/20 0448 10/25/20 0822  WBC 13.4* 12.2* 10.0 11.9*  NEUTROABS 10.4* 9.3* 7.0 7.6  HGB 8.4* 8.5* 7.7* 8.5*  HCT 25.1* 26.2* 23.4* 26.8*  MCV 89.3 90.7 91.1 92.1  PLT 329 367 341 767*   Basic Metabolic Panel: Recent Labs  Lab 10/22/20 1657 10/23/20 0544 10/24/20 0448 10/25/20 0822  NA 127* 132* 136 139  K 3.1* 3.6 3.0* 4.1  CL 97* 100 105 108  CO2 22 24 21* 22  GLUCOSE 139* 110* 94 96  BUN 50* 35* 28* 24*  CREATININE 1.99* 1.67* 1.51* 1.45*  CALCIUM 7.9* 8.1* 8.0* 8.5*   GFR: Estimated Creatinine Clearance: 20.3 mL/min (A) (by C-G formula based on SCr of 1.45 mg/dL (H)). Liver Function Tests: Recent Labs  Lab 10/22/20 1657  AST 19  ALT 17  ALKPHOS 68  BILITOT 0.8  PROT 5.9*  ALBUMIN 2.7*   No results for input(s): LIPASE, AMYLASE in the last 168 hours. No results for input(s): AMMONIA in the last 168 hours. Coagulation Profile: Recent Labs  Lab 10/22/20 1657  INR 1.1   Cardiac Enzymes: No results for input(s): CKTOTAL, CKMB, CKMBINDEX, TROPONINI in the last 168 hours. BNP (last 3 results) No results for input(s): PROBNP in the last 8760 hours. HbA1C: No results for input(s): HGBA1C in the last 72 hours. CBG: Recent Labs  Lab 10/22/20 2115  GLUCAP 132*   Lipid Profile: No results for input(s): CHOL, HDL, LDLCALC, TRIG, CHOLHDL, LDLDIRECT in the last 72 hours. Thyroid Function Tests: No  results for input(s): TSH, T4TOTAL, FREET4, T3FREE, THYROIDAB in the last 72 hours. Anemia Panel: No results for input(s): VITAMINB12, FOLATE, FERRITIN, TIBC, IRON, RETICCTPCT in the last 72 hours. Sepsis Labs: Recent Labs  Lab 10/22/20 1657 10/23/20 0008 10/23/20 0544 10/24/20 0448  PROCALCITON 1.54  --  1.42 0.90  LATICACIDVEN 1.2 1.9  --   --     Recent Results (from the past 240 hour(s))  Blood culture (routine single)     Status: Abnormal (Preliminary result)   Collection Time: 10/22/20  4:57 PM   Specimen: BLOOD  Result Value Ref Range Status   Specimen Description   Final    BLOOD LEFT ANTECUBITAL Performed at [facility], [address] [place], [place] [address]    Special Requests   Final    BOTTLES DRAWN AEROBIC AND ANAEROBIC Blood Culture results may not be optimal due to an excessive volume of blood received in  culture bottles Performed at [facility], [address] [place], [place] [address]    Culture  Setup Time   Final    Organism ID to follow AEROBIC BOTTLE ONLY GRAM NEGATIVE RODS Gram Stain Report Called to,Read Back By and Verified With: SUSAN WATSON PHARMD. 1430. 10/23/2020 GAA Performed at [facility], [address] [place], [place] [address]    Culture ESCHERICHIA COLI (A)  Final   Report Status PENDING  Incomplete   Organism ID, Bacteria ESCHERICHIA COLI  Final      Susceptibility   Escherichia coli - MIC*    AMPICILLIN >=32 RESISTANT Resistant     CEFAZOLIN <=4 SENSITIVE Sensitive     CEFEPIME <=0.12 SENSITIVE Sensitive     CEFTAZIDIME <=1 SENSITIVE Sensitive     CEFTRIAXONE <=0.25 SENSITIVE Sensitive     CIPROFLOXACIN <=0.25 SENSITIVE Sensitive     GENTAMICIN <=1 SENSITIVE Sensitive     IMIPENEM <=0.25 SENSITIVE Sensitive     TRIMETH/SULFA <=20 SENSITIVE Sensitive     AMPICILLIN/SULBACTAM 16 INTERMEDIATE Intermediate     PIP/TAZO <=4 SENSITIVE Sensitive     * ESCHERICHIA COLI  Resp Panel by RT-PCR (Flu  A&B, Covid) Nasopharyngeal Swab     Status: None   Collection Time: 10/22/20  4:57 PM   Specimen: Nasopharyngeal Swab; Nasopharyngeal(NP) swabs in vial transport medium  Result Value Ref Range Status   SARS Coronavirus 2 by RT PCR NEGATIVE NEGATIVE Final    Comment: (NOTE) SARS-CoV-2 target nucleic acids are NOT DETECTED.  The SARS-CoV-2 RNA is generally detectable in upper respiratory specimens during the acute phase of infection. The lowest concentration of SARS-CoV-2 viral copies this assay can detect is 138 copies/mL. A negative result does not preclude SARS-Cov-2 infection and should not be used as the sole basis for treatment or other patient management decisions. A negative result may occur with  improper specimen collection/handling, submission of specimen other than nasopharyngeal swab, presence of viral mutation(s) within the areas targeted by this assay, and inadequate number of viral copies(<138 copies/mL). A negative result must be combined with clinical observations, patient history, and epidemiological information. The expected result is Negative.  Fact Sheet for Patients:  EntrepreneurPulse.com.au  Fact Sheet for Healthcare Providers:  IncredibleEmployment.be  This test is no t yet approved or cleared by the [place] FDA and  has been authorized for detection and/or diagnosis of SARS-CoV-2 by FDA under an Emergency Use Authorization (EUA). This EUA will remain  in effect (meaning this test can be used) for the duration of the COVID-19 declaration under Section 564(b)(1) of the Act, 21 U.S.C.section 360bbb-3(b)(1), unless the authorization is terminated  or revoked sooner.       Influenza A by PCR NEGATIVE NEGATIVE Final   Influenza B by PCR NEGATIVE NEGATIVE Final    Comment: (NOTE) The Xpert Xpress SARS-CoV-2/FLU/RSV plus assay is intended as an aid in the diagnosis of influenza from Nasopharyngeal swab specimens  and should not be used as a sole basis for treatment. Nasal washings and aspirates are unacceptable for Xpert Xpress SARS-CoV-2/FLU/RSV testing.  Fact Sheet for Patients: EntrepreneurPulse.com.au  Fact Sheet for Healthcare Providers: IncredibleEmployment.be  This test is not yet approved or cleared by the [place] FDA and has been authorized for detection and/or diagnosis of SARS-CoV-2 by FDA under an Emergency Use Authorization (EUA). This EUA will remain in effect (meaning this test can be used) for the duration of the COVID-19 declaration under Section 564(b)(1) of the Act, 21 U.S.C. section  360bbb-3(b)(1), unless the authorization is terminated or revoked.  Performed at [facility], [address] [place], [place] [address]   Blood Culture ID Panel (Reflexed)     Status: Abnormal   Collection Time: 10/22/20  4:57 PM  Result Value Ref Range Status   Enterococcus faecalis NOT DETECTED NOT DETECTED Final   Enterococcus Faecium NOT DETECTED NOT DETECTED Final   Listeria monocytogenes NOT DETECTED NOT DETECTED Final   Staphylococcus species NOT DETECTED NOT DETECTED Final   Staphylococcus aureus (BCID) NOT DETECTED NOT DETECTED Final   Staphylococcus epidermidis NOT DETECTED NOT DETECTED Final   Staphylococcus lugdunensis NOT DETECTED NOT DETECTED Final   Streptococcus species NOT DETECTED NOT DETECTED Final   Streptococcus agalactiae NOT DETECTED NOT DETECTED Final   Streptococcus pneumoniae NOT DETECTED NOT DETECTED Final   Streptococcus pyogenes NOT DETECTED NOT DETECTED Final   A.calcoaceticus-baumannii NOT DETECTED NOT DETECTED Final   Bacteroides fragilis NOT DETECTED NOT DETECTED Final   Enterobacterales DETECTED (A) NOT DETECTED Final    Comment: Enterobacterales represent a large order of gram negative bacteria, not a single organism. CRITICAL RESULT CALLED TO, READ BACK BY AND VERIFIED WITH:  [place].  1430 10/23/2020. GAA    Enterobacter cloacae complex NOT DETECTED NOT DETECTED Final   Escherichia coli DETECTED (A) NOT DETECTED Final    Comment: CRITICAL RESULT CALLED TO, READ BACK BY AND VERIFIED WITH:  SUSAN WATSON PHARMD. 1430. 10/23/2020    Klebsiella aerogenes NOT DETECTED NOT DETECTED Final   Klebsiella oxytoca NOT DETECTED NOT DETECTED Final   Klebsiella pneumoniae NOT DETECTED NOT DETECTED Final   Proteus species NOT DETECTED NOT DETECTED Final   Salmonella species NOT DETECTED NOT DETECTED Final   Serratia marcescens NOT DETECTED NOT DETECTED Final   Haemophilus influenzae NOT DETECTED NOT DETECTED Final   Neisseria meningitidis NOT DETECTED NOT DETECTED Final   Pseudomonas aeruginosa NOT DETECTED NOT DETECTED Final   Stenotrophomonas maltophilia NOT DETECTED NOT DETECTED Final   Candida albicans NOT DETECTED NOT DETECTED Final   Candida auris NOT DETECTED NOT DETECTED Final   Candida glabrata NOT DETECTED NOT DETECTED Final   Candida krusei NOT DETECTED NOT DETECTED Final   Candida parapsilosis NOT DETECTED NOT DETECTED Final   Candida tropicalis NOT DETECTED NOT DETECTED Final   Cryptococcus neoformans/gattii NOT DETECTED NOT DETECTED Final   CTX-M ESBL NOT DETECTED NOT DETECTED Final   Carbapenem resistance IMP NOT DETECTED NOT DETECTED Final   Carbapenem resistance KPC NOT DETECTED NOT DETECTED Final   Carbapenem resistance NDM NOT DETECTED NOT DETECTED Final   Carbapenem resist OXA 48 LIKE NOT DETECTED NOT DETECTED Final   Carbapenem resistance VIM NOT DETECTED NOT DETECTED Final    Comment: Performed at [facility], [address] [place], [place] [address]  Urine Culture     Status: Abnormal   Collection Time: 10/22/20  6:50 PM   Specimen: In/Out Cath Urine  Result Value Ref Range Status   Specimen Description   Final    IN/OUT CATH URINE Performed at [facility], [address], [place], [place] [address]    Special Requests   Final     NONE Performed at [facility], [address] [place], [place] [address]    Culture MULTIPLE SPECIES PRESENT, SUGGEST RECOLLECTION (A)  Final   Report Status 10/24/2020 FINAL  Final  Blood culture (routine x 2)     Status: None (Preliminary result)   Collection Time: 10/23/20 12:08 AM  Specimen: BLOOD  Result Value Ref Range Status   Specimen Description BLOOD LEFT ASSIST CONTROL  Final   Special Requests   Final    BOTTLES DRAWN AEROBIC AND ANAEROBIC Blood Culture results may not be optimal due to an inadequate volume of blood received in culture bottles   Culture   Final    NO GROWTH 2 DAYS Performed at [facility], [address] [place], [place] [address]    Report Status PENDING  Incomplete  Resp Panel by RT-PCR (Flu A&B, Covid) Nasopharyngeal Swab     Status: None   Collection Time: 10/25/20 11:07 AM   Specimen: Nasopharyngeal Swab; Nasopharyngeal(NP) swabs in vial transport medium  Result Value Ref Range Status   SARS Coronavirus 2 by RT PCR NEGATIVE NEGATIVE Final    Comment: (NOTE) SARS-CoV-2 target nucleic acids are NOT DETECTED.  The SARS-CoV-2 RNA is generally detectable in upper respiratory specimens during the acute phase of infection. The lowest concentration of SARS-CoV-2 viral copies this assay can detect is 138 copies/mL. A negative result does not preclude SARS-Cov-2 infection and should not be used as the sole basis for treatment or other patient management decisions. A negative result may occur with  improper specimen collection/handling, submission of specimen other than nasopharyngeal swab, presence of viral mutation(s) within the areas targeted by this assay, and inadequate number of viral copies(<138 copies/mL). A negative result must be combined with clinical observations, patient history, and epidemiological information. The expected result is Negative.  Fact Sheet for Patients:   EntrepreneurPulse.com.au  Fact Sheet for Healthcare Providers:  IncredibleEmployment.be  This test is no t yet approved or cleared by the [place] FDA and  has been authorized for detection and/or diagnosis of SARS-CoV-2 by FDA under an Emergency Use Authorization (EUA). This EUA will remain  in effect (meaning this test can be used) for the duration of the COVID-19 declaration under Section 564(b)(1) of the Act, 21 U.S.C.section 360bbb-3(b)(1), unless the authorization is terminated  or revoked sooner.       Influenza A by PCR NEGATIVE NEGATIVE Final   Influenza B by PCR NEGATIVE NEGATIVE Final    Comment: (NOTE) The Xpert Xpress SARS-CoV-2/FLU/RSV plus assay is intended as an aid in the diagnosis of influenza from Nasopharyngeal swab specimens and should not be used as a sole basis for treatment. Nasal washings and aspirates are unacceptable for Xpert Xpress SARS-CoV-2/FLU/RSV testing.  Fact Sheet for Patients: EntrepreneurPulse.com.au  Fact Sheet for Healthcare Providers: IncredibleEmployment.be  This test is not yet approved or cleared by the [place] FDA and has been authorized for detection and/or diagnosis of SARS-CoV-2 by FDA under an Emergency Use Authorization (EUA). This EUA will remain in effect (meaning this test can be used) for the duration of the COVID-19 declaration under Section 564(b)(1) of the Act, 21 U.S.C. section 360bbb-3(b)(1), unless the authorization is terminated or revoked.  Performed at [facility], [address], [place], [place] [address]          Radiology Studies: No results found.      Scheduled Meds:  acidophilus  1 capsule Oral Daily   calcitRIOL  0.25 mcg Oral Q M,W,F   ciprofloxacin  500 mg Oral Daily   heparin  5,000 Units Subcutaneous Q8H   Continuous Infusions:  sodium chloride 50 mL/hr at 10/25/20 0458     LOS: 3 days     Time spent: 25 minutes    Sidney Ace, MD [facility] Pager [phone]  If 7PM-7AM, please contact night-coverage 10/25/2020, 3:03 PM

## 2020-10-25 NOTE — Progress Notes (Signed)
Physical Therapy Treatment Patient Details Name: Laura Wilcox MRN: [id number] DOB: [birth year] Today's Date: 10/25/2020    History of Present Illness 87 y.o. female with a past medical history of polycythemia vera on Jakafi, chronic kidney disease.  The patient presents to the emergency department due to dehydration and lethargy.    PT Comments    Pt still confused and demonstrates decreased safety awareness. RW used with cues for sequencing given. Requires hands on assist for all mobility and limited due to patient fatigue. Still recommend SNF at this time given pt is not at baseline level.   Follow Up Recommendations  SNF     Equipment Recommendations  Rolling walker with 5" wheels    Recommendations for Other Services       Precautions / Restrictions Precautions Precautions: Fall Restrictions Weight Bearing Restrictions: No    Mobility  Bed Mobility               General bed mobility comments: not performed as received standing in bathroom with CNA    Transfers Overall transfer level: Needs assistance Equipment used: Rolling walker (2 wheeled) Transfers: Sit to/from Stand Sit to Stand: Min assist         General transfer comment: needs min assist for safety. Once standing forward flexed posture with RW use  Ambulation/Gait Ambulation/Gait assistance: Min assist Gait Distance (Feet): 80 Feet Assistive device: Rolling walker (2 wheeled) Gait Pattern/deviations: Step-through pattern     General Gait Details: has difficulty with command following and needs cues for RW guidance. Fatigues quickly and request to return back to room. Reliance on RW noted. Needs cues for obstacle avoidance   Stairs             Wheelchair Mobility    Modified Rankin (Stroke Patients Only)       Balance Overall balance assessment: Needs assistance Sitting-balance support: No upper extremity supported;Feet supported Sitting balance-Leahy Scale: Good      Standing balance support: Bilateral upper extremity supported Standing balance-Leahy Scale: Fair                              Cognition Arousal/Alertness: Awake/alert Behavior During Therapy: Agitated;Restless Overall Cognitive Status: Impaired/Different from baseline                                 General Comments: confused to place and situation. Needs direct commands for safety. Easily distracted and appears restless      Exercises Other Exercises Other Exercises: received in bathroom with CNA. Needs assist for hygiene and cues for sequencing of ADLs task    General Comments        Pertinent Vitals/Pain Pain Assessment: No/denies pain    Home Living                      Prior Function            PT Goals (current goals can now be found in the care plan section) Acute Rehab PT Goals Patient Stated Goal: to return to PLOF PT Goal Formulation: With patient Time For Goal Achievement: 11/06/20 Potential to Achieve Goals: Good Progress towards PT goals: Progressing toward goals    Frequency    Min 2X/week      PT Plan Current plan remains appropriate    Co-evaluation  AM-PAC PT "6 Clicks" Mobility   Outcome Measure  Help needed turning from your back to your side while in a flat bed without using bedrails?: A Little Help needed moving from lying on your back to sitting on the side of a flat bed without using bedrails?: A Little Help needed moving to and from a bed to a chair (including a wheelchair)?: A Little Help needed standing up from a chair using your arms (e.g., wheelchair or bedside chair)?: A Little Help needed to walk in hospital room?: A Little Help needed climbing 3-5 steps with a railing? : A Little 6 Click Score: 18    End of Session Equipment Utilized During Treatment: Gait belt Activity Tolerance: Patient tolerated treatment well Patient left: in chair;with chair alarm set;with  family/visitor present Nurse Communication: Mobility status PT Visit Diagnosis: Unsteadiness on feet (R26.81);Muscle weakness (generalized) (M62.81);Difficulty in walking, not elsewhere classified (R26.2)     Time: 6045-4098 PT Time Calculation (min) (ACUTE ONLY): 17 min  Charges:  $Gait Training: 8-22 mins                     Greggory Stallion, PT, DPT [phone]    Laura Wilcox 10/25/2020, 11:44 AM

## 2020-10-25 NOTE — TOC Progression Note (Signed)
Transition of Care [facility]) - Progression Note    Patient Details  Name: Laura Wilcox MRN: [id number] Date of Birth: 21-Oct-1926  Transition of Care [facility]) CM/SW [place], RN Phone Number: 10/25/2020, 3:14 PM  Clinical Narrative:      Spoke with the patient  's daughter Laura Wilcox she accepted the bed offer I notified Ricky at [employer] and he is aware the plan is to [place]      Expected Discharge Plan and Services                                                 Social Determinants of Health (SDOH) Interventions    Readmission Risk Interventions No flowsheet data found.

## 2020-10-25 NOTE — NC FL2 (Signed)
[place] LEVEL OF CARE SCREENING TOOL     IDENTIFICATION  Patient Name: Laura Wilcox Birthdate: [birth year] Sex: female Admission Date (Current Location): 10/22/2020  [facility] and [place] Number:  Engineering geologist and Address:  [facility], [address] [place], [place] [address]      Provider Number: [id number]  Attending Physician Name and Address:  Sidney Ace, MD  Relative Name and Phone Number:  [name] [phone]    Current Level of Care: Hospital Recommended Level of Care: [place] Prior Approval Number:    Date Approved/Denied:   PASRR Number: [id number]  Discharge Plan: SNF    Current Diagnoses: Patient Active Problem List   Diagnosis Date Noted   Acute metabolic encephalopathy 35/59/7416    Orientation RESPIRATION BLADDER Height & Weight     Self, Time, Situation, Place  Normal Continent, External catheter Weight: 51 kg Height:  5\' 7"  (170.2 cm)  BEHAVIORAL SYMPTOMS/MOOD NEUROLOGICAL BOWEL NUTRITION STATUS      Continent Diet (regular diet)  AMBULATORY STATUS COMMUNICATION OF NEEDS Skin   Extensive Assist Verbally Normal, Bruising                       Personal Care Assistance Level of Assistance  Bathing, Feeding, Dressing Bathing Assistance: Limited assistance Feeding assistance: Independent Dressing Assistance: Limited assistance     Functional Limitations Info  Hearing   Hearing Info: Impaired      SPECIAL CARE FACTORS FREQUENCY  PT (By licensed PT)     PT Frequency: 5 times per week OT Frequency: 3 times per week            Contractures      Additional Factors Info  Code Status, Allergies Code Status Info: full code Allergies Info: NKDA           Current Medications (10/25/2020):  This is the current hospital active medication list Current Facility-Administered Medications  Medication Dose Route Frequency Provider Last Rate Last Admin   0.9  %  sodium chloride infusion   Intravenous Continuous Ralene Muskrat B, MD 50 mL/hr at 10/25/20 0458 New Bag at 10/25/20 0458   acetaminophen (TYLENOL) tablet 650 mg  650 mg Oral Q6H PRN Imagene Sheller S, DO       Or   acetaminophen (TYLENOL) suppository 650 mg  650 mg Rectal Q6H PRN Imagene Sheller S, DO       calcitRIOL (ROCALTROL) capsule 0.25 mcg  0.25 mcg Oral Q M,W,F Imagene Sheller S, DO   0.25 mcg at 10/25/20 0801   ciprofloxacin (CIPRO) tablet 500 mg  500 mg Oral Daily Ralene Muskrat B, MD       heparin injection 5,000 Units  5,000 Units Subcutaneous Q8H Imagene Sheller S, DO   5,000 Units at 10/25/20 0459   HYDROcodone-acetaminophen (NORCO/VICODIN) 5-325 MG per tablet 1-2 tablet  1-2 tablet Oral Q4H PRN Imagene Sheller S, DO       morphine 2 MG/ML injection 2 mg  2 mg Intravenous Q2H PRN Imagene Sheller S, DO       ondansetron (ZOFRAN) tablet 4 mg  4 mg Oral Q6H PRN Imagene Sheller S, DO       Or   ondansetron (ZOFRAN) injection 4 mg  4 mg Intravenous Q6H PRN Anwar, Shayan S, DO       polyethylene glycol (MIRALAX / GLYCOLAX) packet 17 g  17 g Oral Daily PRN Leslee Home, DO  Discharge Medications: Please see discharge summary for a list of discharge medications.  Relevant Imaging Results:  Relevant Lab Results:   Additional Information SS# [id number]  Su Hilt, RN

## 2020-10-25 NOTE — Progress Notes (Signed)
PHARMACY NOTE:  ANTIMICROBIAL RENAL DOSAGE ADJUSTMENT  Current antimicrobial regimen includes a mismatch between antimicrobial dosage and estimated renal function.  As per policy approved by the Pharmacy & Therapeutics and Medical Executive Committees, the antimicrobial dosage will be adjusted accordingly.  Current antimicrobial dosage:  ciprofloxacin 500mg  PO BID  Indication: E Coli bacteremia  Renal Function:  Estimated Creatinine Clearance: 20.3 mL/min (A) (by C-G formula based on SCr of 1.45 mg/dL (H)). []      On intermittent HD, scheduled: []      On CRRT    Antimicrobial dosage has been changed to:  Ciprofloxacin 500mg  daily   Additional comments:   Thank you for allowing pharmacy to be a part of this patient's care.  Doreene Eland, PharmD, BCPS.   Work Cell: [phone] 10/25/2020 10:11 AM

## 2020-10-25 NOTE — TOC Progression Note (Signed)
Transition of Care [facility]) - Progression Note    Patient Details  Name: Laura Wilcox MRN: [id number] Date of Birth: [birth year]  Transition of Care [facility]) CM/SW [place], RN Phone Number: 10/25/2020, 2:59 PM  Clinical Narrative:     [name] to [name] and reviewed the bed offers, She is asking to call her family and review the offers before making a choice, will call me back        Expected Discharge Plan and Services                                                 Social Determinants of Health (SDOH) Interventions    Readmission Risk Interventions No flowsheet data found.

## 2020-10-25 NOTE — TOC Initial Note (Signed)
Transition of Care [facility]) - Initial/Assessment Note    Patient Details  Name: Sephora Boyar MRN: [id number] Date of Birth: [birth year]  Transition of Care [facility]) CM/SW Contact:    Su Hilt, RN Phone Number: 10/25/2020, 10:53 AM  Clinical Narrative:                  Met with the patient and her daughter in the room, they are now agreeable to go to [facility] SNF and for a bed search she has had 2 covid vaccines and 1 booster, Bed search sent       Patient Goals and CMS Choice        Expected Discharge Plan and Services                                                Prior Living Arrangements/Services                       Activities of Daily Living Home Assistive Devices/Equipment: Eyeglasses ADL Screening (condition at time of admission) Patient's cognitive ability adequate to safely complete daily activities?: Yes Is the patient deaf or have difficulty hearing?: Yes Does the patient have difficulty seeing, even when wearing glasses/contacts?: No Does the patient have difficulty concentrating, remembering, or making decisions?: No Patient able to express need for assistance with ADLs?: Yes Does the patient have difficulty dressing or bathing?: No Independently performs ADLs?: Yes (appropriate for developmental age) Does the patient have difficulty walking or climbing stairs?: Yes Weakness of Legs: Both Weakness of Arms/Hands: Both  Permission Sought/Granted                  Emotional Assessment              Admission diagnosis:  Dyspnea [R06.00] Sepsis secondary to UTI ([place]) [A41.9, S31.5] Acute metabolic encephalopathy [X45.85] Patient Active Problem List   Diagnosis Date Noted   Acute metabolic encephalopathy 92/92/4462   PCP:  Glean Hess, MD Pharmacy:   [facility] [phone] [address], [address] [place] [address] Phone: [phone] Fax:  [phone]     Social Determinants of Health (SDOH) Interventions    Readmission Risk Interventions No flowsheet data found.

## 2020-10-26 DIAGNOSIS — N1832 Chronic kidney disease, stage 3b: Secondary | ICD-10-CM | POA: Diagnosis not present

## 2020-10-26 DIAGNOSIS — N179 Acute kidney failure, unspecified: Secondary | ICD-10-CM | POA: Diagnosis not present

## 2020-10-26 DIAGNOSIS — Z4789 Encounter for other orthopedic aftercare: Secondary | ICD-10-CM | POA: Diagnosis not present

## 2020-10-26 DIAGNOSIS — R4182 Altered mental status, unspecified: Secondary | ICD-10-CM | POA: Diagnosis not present

## 2020-10-26 DIAGNOSIS — N390 Urinary tract infection, site not specified: Secondary | ICD-10-CM | POA: Diagnosis not present

## 2020-10-26 DIAGNOSIS — R1313 Dysphagia, pharyngeal phase: Secondary | ICD-10-CM | POA: Diagnosis not present

## 2020-10-26 DIAGNOSIS — R5381 Other malaise: Secondary | ICD-10-CM | POA: Diagnosis not present

## 2020-10-26 DIAGNOSIS — E559 Vitamin D deficiency, unspecified: Secondary | ICD-10-CM | POA: Diagnosis not present

## 2020-10-26 DIAGNOSIS — R4189 Other symptoms and signs involving cognitive functions and awareness: Secondary | ICD-10-CM | POA: Diagnosis not present

## 2020-10-26 DIAGNOSIS — R279 Unspecified lack of coordination: Secondary | ICD-10-CM | POA: Diagnosis not present

## 2020-10-26 DIAGNOSIS — R41841 Cognitive communication deficit: Secondary | ICD-10-CM | POA: Diagnosis not present

## 2020-10-26 DIAGNOSIS — G9341 Metabolic encephalopathy: Secondary | ICD-10-CM | POA: Diagnosis not present

## 2020-10-26 DIAGNOSIS — R600 Localized edema: Secondary | ICD-10-CM | POA: Diagnosis not present

## 2020-10-26 DIAGNOSIS — S72142D Displaced intertrochanteric fracture of left femur, subsequent encounter for closed fracture with routine healing: Secondary | ICD-10-CM | POA: Diagnosis not present

## 2020-10-26 DIAGNOSIS — N2581 Secondary hyperparathyroidism of renal origin: Secondary | ICD-10-CM | POA: Diagnosis not present

## 2020-10-26 DIAGNOSIS — M6259 Muscle wasting and atrophy, not elsewhere classified, multiple sites: Secondary | ICD-10-CM | POA: Diagnosis not present

## 2020-10-26 DIAGNOSIS — R2689 Other abnormalities of gait and mobility: Secondary | ICD-10-CM | POA: Diagnosis not present

## 2020-10-26 DIAGNOSIS — N183 Chronic kidney disease, stage 3 unspecified: Secondary | ICD-10-CM | POA: Diagnosis not present

## 2020-10-26 DIAGNOSIS — E860 Dehydration: Secondary | ICD-10-CM | POA: Diagnosis not present

## 2020-10-26 DIAGNOSIS — B9620 Unspecified Escherichia coli [E. coli] as the cause of diseases classified elsewhere: Secondary | ICD-10-CM | POA: Diagnosis not present

## 2020-10-26 DIAGNOSIS — D631 Anemia in chronic kidney disease: Secondary | ICD-10-CM | POA: Diagnosis not present

## 2020-10-26 DIAGNOSIS — M6281 Muscle weakness (generalized): Secondary | ICD-10-CM | POA: Diagnosis not present

## 2020-10-26 DIAGNOSIS — N3001 Acute cystitis with hematuria: Secondary | ICD-10-CM | POA: Diagnosis not present

## 2020-10-26 DIAGNOSIS — D45 Polycythemia vera: Secondary | ICD-10-CM | POA: Diagnosis not present

## 2020-10-26 DIAGNOSIS — E440 Moderate protein-calorie malnutrition: Secondary | ICD-10-CM | POA: Diagnosis not present

## 2020-10-26 DIAGNOSIS — R531 Weakness: Secondary | ICD-10-CM | POA: Diagnosis not present

## 2020-10-26 MED ORDER — CIPROFLOXACIN HCL 500 MG PO TABS: 500.0000 mg | ORAL_TABLET | Freq: Every day | ORAL | 0 refills | Status: AC

## 2020-10-26 MED ORDER — ENSURE ENLIVE PO LIQD: 237.0000 mL | Freq: Two times a day (BID) | ORAL | 12 refills | Status: DC

## 2020-10-26 MED ORDER — RISAQUAD PO CAPS: 1.0000 | ORAL_CAPSULE | Freq: Every day | ORAL | Status: DC

## 2020-10-26 NOTE — TOC Progression Note (Signed)
Transition of Care [facility]) - Progression Note    Patient Details  Name: Laura Wilcox MRN: [id number] Date of Birth: February 16, 1927  Transition of Care [facility]) CM/SW [place], RN Phone Number: 10/26/2020, 12:09 PM  Clinical Narrative:      [facility] is picking up the patient at 3 PM to take to [facility], Her daugher [name] is aware      Expected Discharge Plan and Services           Expected Discharge Date: 10/26/20                                     Social Determinants of Health (SDOH) Interventions    Readmission Risk Interventions No flowsheet data found.

## 2020-10-26 NOTE — Discharge Summary (Signed)
Physician Discharge Summary  Laura Wilcox XAJ:[id number] DOB: 08-15-1926 DOA: 10/22/2020  PCP: Glean Hess, MD  Admit date: 10/22/2020 Discharge date: 10/26/2020  Admitted From: Home Disposition: Skilled nursing facility  Recommendations for Outpatient Follow-up:  Follow up with PCP in 1-2 weeks   Home Health: No Equipment/Devices: None  Discharge Condition: Stable CODE STATUS: Full Diet recommendation: Regular  Brief/Interim Summary: 87 y.o. female with a past medical history of polycythemia vera on Jakafi, chronic kidney disease.  The patient presents to the emergency department due to dehydration and lethargy.  The daughter states that she was lethargic this morning.  The daughter lives close by.  The patient lives alone.  She is also confused.  She usually gets like this when she has a urinary tract infection.  Upon EMS arrival her blood pressure was in the 09O systolic.  Was given 500 cc of IV fluids in route.  Usually the patient is independent.  No assistance with ambulation.  Can do her own bills.  Upon my exam she can tell me her name and where she is.  But she cannot tell me the date.  Hemodynamics have improved with fluid resuscitation.  Blood cultures positive for E. Coli.   E. coli pansensitive.  Transition oral antibiotics on 7/27.  Patient is medically stable for discharge at this time however she remains significantly weak and deconditioned.  Likely will benefit from skilled nursing facility at time of discharge.  Is reluctant but after several conversations with myself and the case manager they have agreed to skilled nursing facility.  Agreed to SNF bed.  Discharged in stable condition.  Discharge Diagnoses:  Active Problems:   Acute metabolic encephalopathy  Acute metabolic encephalopathy secondary to uncomplicated urinary tract infection: E. coli bacteremia secondary to above Sepsis secondary to above Likely mental status changes driven by UTI and associated  bacteremia Sepsis criteria met with tachypnea leukocytosis Blood cultures with E. coli, pansensitive Discharge plan: Start ciprofloxacin, pharmacy dosing.  500 mg daily x7 total days Stable for discharge to skilled nursing facility   Acute kidney injury on chronic kidney disease Suspect prerenal azotemia secondary to dehydration in setting of infection Kidney function is improving Approaching baseline at time of discharge Follow-up outpatient nephrology Ensure adequate p.o. fluid intake   Hypovolemic hyponatremia, resolved Plan as above.       Hypokalemia Improved   Polycythemia vera: No acute treatment.  Outpatient follow-up with oncology  Discharge Instructions  Discharge Instructions     Diet - low sodium heart healthy   Complete by: As directed    Increase activity slowly   Complete by: As directed       Allergies as of 10/26/2020   Not on File      Medication List     TAKE these medications    acetaminophen 325 MG tablet Commonly known as: TYLENOL Take 325-650 mg by mouth every 6 (six) hours as needed for mild pain or moderate pain.   acidophilus Caps capsule Take 1 capsule by mouth daily.   calcitRIOL 0.25 MCG capsule Commonly known as: ROCALTROL Take 0.25 mcg by mouth every Monday, Wednesday, and Friday.   ciprofloxacin 500 MG tablet Commonly known as: CIPRO Take 1 tablet (500 mg total) by mouth daily for 4 days.   feeding supplement Liqd Take 237 mLs by mouth 2 (two) times daily between meals.   ruxolitinib phosphate 15 MG tablet Commonly known as: JAKAFI Take 15 mg by mouth 2 (two) times daily.  Durable Medical Equipment  (From admission, onward)           Start     Ordered   10/25/20 0742  For home use only DME 3 n 1  Once        10/25/20 0741   10/25/20 0742  For home use only DME Tub bench  Once        10/25/20 0741            Contact information for after-discharge care     Destination      [facility] .   Service: Skilled Nursing Contact information: [address] [place] [phone]                    Not on File  Consultations: None   Procedures/Studies: DG Chest Port 1 View  Result Date: 10/22/2020 CLINICAL DATA:  High fever and dyspnea. EXAM: PORTABLE CHEST 1 VIEW COMPARISON:  10/22/2020 FINDINGS: Shallow inspiration and patient rotation limits examination. Heart size and pulmonary vascularity are normal for technique. Lungs are clear. No pleural effusions. No pneumothorax. Esophageal hiatal hernia behind the heart. Tortuous aorta. IMPRESSION: No active disease. Electronically Signed   By: Lucienne Capers M.D.   On: 10/22/2020 22:42   DG Chest Port 1 View  Result Date: 10/22/2020 CLINICAL DATA:  Lethargy. Initial hypotension. Possible UTI or sepsis. Anemia. EXAM: PORTABLE CHEST 1 VIEW COMPARISON:  12/17/2018 FINDINGS: The patient is rotated to the right on today's radiograph, reducing diagnostic sensitivity and specificity. Atherosclerotic calcification of the aortic arch. The lungs appear clear. Heart size within normal limits for projection and degree of rotation. No acute bony findings. No blunting of the costophrenic angles. IMPRESSION: 1. No findings of pneumonia. 2.  Aortic Atherosclerosis (ICD10-I70.0). Electronically Signed   By: Van Clines M.D.   On: 10/22/2020 17:32   (Echo, Carotid, EGD, Colonoscopy, ERCP)    Subjective: Seen and examined the day of discharge.  Upset about the skilled nursing facility however after repeated conversations is agreeable.  Discharge Exam: Vitals:   10/25/20 1940 10/26/20 0617  BP: (!) 155/76 107/71  Pulse: 85 82  Resp: 17 18  Temp: 97.9 F (36.6 C) 98.3 F (36.8 C)  SpO2: 99% 99%   Vitals:   10/25/20 1626 10/25/20 1722 10/25/20 1940 10/26/20 0617  BP: (!) 72/59 (!) 84/73 (!) 155/76 107/71  Pulse: 81 78 85 82  Resp:  14 17 18   Temp:  98 F  (36.7 C) 97.9 F (36.6 C) 98.3 F (36.8 C)  TempSrc:  Oral    SpO2:  95% 99% 99%  Weight:      Height:        General: Pt is alert, awake, not in acute distress Cardiovascular: RRR, S1/S2 +, no rubs, no gallops Respiratory: CTA bilaterally, no wheezing, no rhonchi Abdominal: Soft, NT, ND, bowel sounds + Extremities: no edema, no cyanosis    The results of significant diagnostics from this hospitalization (including imaging, microbiology, ancillary and laboratory) are listed below for reference.     Microbiology: Recent Results (from the past 240 hour(s))  Blood culture (routine single)     Status: Abnormal (Preliminary result)   Collection Time: 10/22/20  4:57 PM   Specimen: BLOOD  Result Value Ref Range Status   Specimen Description   Final    BLOOD LEFT ANTECUBITAL Performed at [facility], [address], [place], [place] [address]    Special Requests  Final    BOTTLES DRAWN AEROBIC AND ANAEROBIC Blood Culture results may not be optimal due to an excessive volume of blood received in culture bottles Performed at [facility], [address] [place], [place] [address]    Culture  Setup Time   Final    Organism ID to follow AEROBIC BOTTLE ONLY GRAM NEGATIVE RODS Gram Stain Report Called to,Read Back By and Verified With: SUSAN WATSON PHARMD. 1430. 10/23/2020 GAA Performed at [facility], [address], [place], [place] [address]    Culture ESCHERICHIA COLI (A)  Final   Report Status PENDING  Incomplete   Organism ID, Bacteria ESCHERICHIA COLI  Final      Susceptibility   Escherichia coli - MIC*    AMPICILLIN >=32 RESISTANT Resistant     CEFAZOLIN <=4 SENSITIVE Sensitive     CEFEPIME <=0.12 SENSITIVE Sensitive     CEFTAZIDIME <=1 SENSITIVE Sensitive     CEFTRIAXONE <=0.25 SENSITIVE Sensitive     CIPROFLOXACIN <=0.25 SENSITIVE Sensitive     GENTAMICIN <=1 SENSITIVE Sensitive     IMIPENEM <=0.25 SENSITIVE Sensitive      TRIMETH/SULFA <=20 SENSITIVE Sensitive     AMPICILLIN/SULBACTAM 16 INTERMEDIATE Intermediate     PIP/TAZO <=4 SENSITIVE Sensitive     * ESCHERICHIA COLI  Resp Panel by RT-PCR (Flu A&B, Covid) Nasopharyngeal Swab     Status: None   Collection Time: 10/22/20  4:57 PM   Specimen: Nasopharyngeal Swab; Nasopharyngeal(NP) swabs in vial transport medium  Result Value Ref Range Status   SARS Coronavirus 2 by RT PCR NEGATIVE NEGATIVE Final    Comment: (NOTE) SARS-CoV-2 target nucleic acids are NOT DETECTED.  The SARS-CoV-2 RNA is generally detectable in upper respiratory specimens during the acute phase of infection. The lowest concentration of SARS-CoV-2 viral copies this assay can detect is 138 copies/mL. A negative result does not preclude SARS-Cov-2 infection and should not be used as the sole basis for treatment or other patient management decisions. A negative result may occur with  improper specimen collection/handling, submission of specimen other than nasopharyngeal swab, presence of viral mutation(s) within the areas targeted by this assay, and inadequate number of viral copies(<138 copies/mL). A negative result must be combined with clinical observations, patient history, and epidemiological information. The expected result is Negative.  Fact Sheet for Patients:  EntrepreneurPulse.com.au  Fact Sheet for Healthcare Providers:  IncredibleEmployment.be  This test is no t yet approved or cleared by the [place] FDA and  has been authorized for detection and/or diagnosis of SARS-CoV-2 by FDA under an Emergency Use Authorization (EUA). This EUA will remain  in effect (meaning this test can be used) for the duration of the COVID-19 declaration under Section 564(b)(1) of the Act, 21 U.S.C.section 360bbb-3(b)(1), unless the authorization is terminated  or revoked sooner.       Influenza A by PCR NEGATIVE NEGATIVE Final   Influenza B by PCR  NEGATIVE NEGATIVE Final    Comment: (NOTE) The Xpert Xpress SARS-CoV-2/FLU/RSV plus assay is intended as an aid in the diagnosis of influenza from Nasopharyngeal swab specimens and should not be used as a sole basis for treatment. Nasal washings and aspirates are unacceptable for Xpert Xpress SARS-CoV-2/FLU/RSV testing.  Fact Sheet for Patients: EntrepreneurPulse.com.au  Fact Sheet for Healthcare Providers: IncredibleEmployment.be  This test is not yet approved or cleared by the [place] FDA and has been authorized for detection and/or diagnosis of SARS-CoV-2 by FDA under an Emergency Use Authorization (EUA). This EUA will  remain in effect (meaning this test can be used) for the duration of the COVID-19 declaration under Section 564(b)(1) of the Act, 21 U.S.C. section 360bbb-3(b)(1), unless the authorization is terminated or revoked.  Performed at [facility], [address] [place], [place] [address]   Blood Culture ID Panel (Reflexed)     Status: Abnormal   Collection Time: 10/22/20  4:57 PM  Result Value Ref Range Status   Enterococcus faecalis NOT DETECTED NOT DETECTED Final   Enterococcus Faecium NOT DETECTED NOT DETECTED Final   Listeria monocytogenes NOT DETECTED NOT DETECTED Final   Staphylococcus species NOT DETECTED NOT DETECTED Final   Staphylococcus aureus (BCID) NOT DETECTED NOT DETECTED Final   Staphylococcus epidermidis NOT DETECTED NOT DETECTED Final   Staphylococcus lugdunensis NOT DETECTED NOT DETECTED Final   Streptococcus species NOT DETECTED NOT DETECTED Final   Streptococcus agalactiae NOT DETECTED NOT DETECTED Final   Streptococcus pneumoniae NOT DETECTED NOT DETECTED Final   Streptococcus pyogenes NOT DETECTED NOT DETECTED Final   A.calcoaceticus-baumannii NOT DETECTED NOT DETECTED Final   Bacteroides fragilis NOT DETECTED NOT DETECTED Final   Enterobacterales DETECTED (A) NOT DETECTED Final     Comment: Enterobacterales represent a large order of gram negative bacteria, not a single organism. CRITICAL RESULT CALLED TO, READ BACK BY AND VERIFIED WITH:  [place]. 1430 10/23/2020. GAA    Enterobacter cloacae complex NOT DETECTED NOT DETECTED Final   Escherichia coli DETECTED (A) NOT DETECTED Final    Comment: CRITICAL RESULT CALLED TO, READ BACK BY AND VERIFIED WITH:  SUSAN WATSON PHARMD. 1430. 10/23/2020    Klebsiella aerogenes NOT DETECTED NOT DETECTED Final   Klebsiella oxytoca NOT DETECTED NOT DETECTED Final   Klebsiella pneumoniae NOT DETECTED NOT DETECTED Final   Proteus species NOT DETECTED NOT DETECTED Final   Salmonella species NOT DETECTED NOT DETECTED Final   Serratia marcescens NOT DETECTED NOT DETECTED Final   Haemophilus influenzae NOT DETECTED NOT DETECTED Final   Neisseria meningitidis NOT DETECTED NOT DETECTED Final   Pseudomonas aeruginosa NOT DETECTED NOT DETECTED Final   Stenotrophomonas maltophilia NOT DETECTED NOT DETECTED Final   Candida albicans NOT DETECTED NOT DETECTED Final   Candida auris NOT DETECTED NOT DETECTED Final   Candida glabrata NOT DETECTED NOT DETECTED Final   Candida krusei NOT DETECTED NOT DETECTED Final   Candida parapsilosis NOT DETECTED NOT DETECTED Final   Candida tropicalis NOT DETECTED NOT DETECTED Final   Cryptococcus neoformans/gattii NOT DETECTED NOT DETECTED Final   CTX-M ESBL NOT DETECTED NOT DETECTED Final   Carbapenem resistance IMP NOT DETECTED NOT DETECTED Final   Carbapenem resistance KPC NOT DETECTED NOT DETECTED Final   Carbapenem resistance NDM NOT DETECTED NOT DETECTED Final   Carbapenem resist OXA 48 LIKE NOT DETECTED NOT DETECTED Final   Carbapenem resistance VIM NOT DETECTED NOT DETECTED Final    Comment: Performed at [facility], [address], [place], [place] [address]  Urine Culture     Status: Abnormal   Collection Time: 10/22/20  6:50 PM   Specimen: In/Out Cath Urine  Result Value  Ref Range Status   Specimen Description   Final    IN/OUT CATH URINE Performed at [facility], [address] [place], [place] [address]    Special Requests   Final    NONE Performed at [facility], [address] [place],  [address]    Culture MULTIPLE SPECIES PRESENT, SUGGEST RECOLLECTION (A)  Final   Report Status 10/24/2020  FINAL  Final  Blood culture (routine x 2)     Status: None (Preliminary result)   Collection Time: 10/23/20 12:08 AM   Specimen: BLOOD  Result Value Ref Range Status   Specimen Description BLOOD LEFT ASSIST CONTROL  Final   Special Requests   Final    BOTTLES DRAWN AEROBIC AND ANAEROBIC Blood Culture results may not be optimal due to an inadequate volume of blood received in culture bottles   Culture   Final    NO GROWTH 3 DAYS Performed at [facility], [address] [place], [place] [address]    Report Status PENDING  Incomplete  Resp Panel by RT-PCR (Flu A&B, Covid) Nasopharyngeal Swab     Status: None   Collection Time: 10/25/20 11:07 AM   Specimen: Nasopharyngeal Swab; Nasopharyngeal(NP) swabs in vial transport medium  Result Value Ref Range Status   SARS Coronavirus 2 by RT PCR NEGATIVE NEGATIVE Final    Comment: (NOTE) SARS-CoV-2 target nucleic acids are NOT DETECTED.  The SARS-CoV-2 RNA is generally detectable in upper respiratory specimens during the acute phase of infection. The lowest concentration of SARS-CoV-2 viral copies this assay can detect is 138 copies/mL. A negative result does not preclude SARS-Cov-2 infection and should not be used as the sole basis for treatment or other patient management decisions. A negative result may occur with  improper specimen collection/handling, submission of specimen other than nasopharyngeal swab, presence of viral mutation(s) within the areas targeted by this assay, and inadequate number of viral copies(<138 copies/mL). A negative result must be combined  with clinical observations, patient history, and epidemiological information. The expected result is Negative.  Fact Sheet for Patients:  EntrepreneurPulse.com.au  Fact Sheet for Healthcare Providers:  IncredibleEmployment.be  This test is no t yet approved or cleared by the [place] FDA and  has been authorized for detection and/or diagnosis of SARS-CoV-2 by FDA under an Emergency Use Authorization (EUA). This EUA will remain  in effect (meaning this test can be used) for the duration of the COVID-19 declaration under Section 564(b)(1) of the Act, 21 U.S.C.section 360bbb-3(b)(1), unless the authorization is terminated  or revoked sooner.       Influenza A by PCR NEGATIVE NEGATIVE Final   Influenza B by PCR NEGATIVE NEGATIVE Final    Comment: (NOTE) The Xpert Xpress SARS-CoV-2/FLU/RSV plus assay is intended as an aid in the diagnosis of influenza from Nasopharyngeal swab specimens and should not be used as a sole basis for treatment. Nasal washings and aspirates are unacceptable for Xpert Xpress SARS-CoV-2/FLU/RSV testing.  Fact Sheet for Patients: EntrepreneurPulse.com.au  Fact Sheet for Healthcare Providers: IncredibleEmployment.be  This test is not yet approved or cleared by the [place] FDA and has been authorized for detection and/or diagnosis of SARS-CoV-2 by FDA under an Emergency Use Authorization (EUA). This EUA will remain in effect (meaning this test can be used) for the duration of the COVID-19 declaration under Section 564(b)(1) of the Act, 21 U.S.C. section 360bbb-3(b)(1), unless the authorization is terminated or revoked.  Performed at [facility], [address] [place], [place] [address]      Labs: BNP (last 3 results) No results for input(s): BNP in the last 8760 hours. Basic Metabolic Panel: Recent Labs  Lab 10/22/20 1657 10/23/20 0544 10/24/20 0448  10/25/20 0822  NA 127* 132* 136 139  K 3.1* 3.6 3.0* 4.1  CL 97* 100 105 108  CO2 22 24 21* 22  GLUCOSE 139* 110* 94 96  BUN 50* 35* 28* 24*  CREATININE 1.99* 1.67* 1.51* 1.45*  CALCIUM 7.9* 8.1* 8.0* 8.5*   Liver Function Tests: Recent Labs  Lab 10/22/20 1657  AST 19  ALT 17  ALKPHOS 68  BILITOT 0.8  PROT 5.9*  ALBUMIN 2.7*   No results for input(s): LIPASE, AMYLASE in the last 168 hours. No results for input(s): AMMONIA in the last 168 hours. CBC: Recent Labs  Lab 10/22/20 1657 10/23/20 0544 10/24/20 0448 10/25/20 0822  WBC 13.4* 12.2* 10.0 11.9*  NEUTROABS 10.4* 9.3* 7.0 7.6  HGB 8.4* 8.5* 7.7* 8.5*  HCT 25.1* 26.2* 23.4* 26.8*  MCV 89.3 90.7 91.1 92.1  PLT 329 367 341 412*   Cardiac Enzymes: No results for input(s): CKTOTAL, CKMB, CKMBINDEX, TROPONINI in the last 168 hours. BNP: Invalid input(s): POCBNP CBG: Recent Labs  Lab 10/22/20 2115  GLUCAP 132*   D-Dimer No results for input(s): DDIMER in the last 72 hours. Hgb A1c No results for input(s): HGBA1C in the last 72 hours. Lipid Profile No results for input(s): CHOL, HDL, LDLCALC, TRIG, CHOLHDL, LDLDIRECT in the last 72 hours. Thyroid function studies No results for input(s): TSH, T4TOTAL, T3FREE, THYROIDAB in the last 72 hours.  Invalid input(s): FREET3 Anemia work up No results for input(s): VITAMINB12, FOLATE, FERRITIN, TIBC, IRON, RETICCTPCT in the last 72 hours. Urinalysis    Component Value Date/Time   COLORURINE YELLOW (A) 10/22/2020 1850   APPEARANCEUR HAZY (A) 10/22/2020 1850   LABSPEC 1.004 (L) 10/22/2020 1850   PHURINE 6.0 10/22/2020 1850   GLUCOSEU NEGATIVE 10/22/2020 1850   HGBUR MODERATE (A) 10/22/2020 1850   BILIRUBINUR NEGATIVE 10/22/2020 1850   KETONESUR NEGATIVE 10/22/2020 1850   PROTEINUR 30 (A) 10/22/2020 1850   NITRITE NEGATIVE 10/22/2020 1850   LEUKOCYTESUR MODERATE (A) 10/22/2020 1850   Sepsis Labs Invalid input(s): PROCALCITONIN,  WBC,   LACTICIDVEN Microbiology Recent Results (from the past 240 hour(s))  Blood culture (routine single)     Status: Abnormal (Preliminary result)   Collection Time: 10/22/20  4:57 PM   Specimen: BLOOD  Result Value Ref Range Status   Specimen Description   Final    BLOOD LEFT ANTECUBITAL Performed at [facility], [address] [place], [place] [address]    Special Requests   Final    BOTTLES DRAWN AEROBIC AND ANAEROBIC Blood Culture results may not be optimal due to an excessive volume of blood received in culture bottles Performed at [facility], [address] [place], [place] [address]    Culture  Setup Time   Final    Organism ID to follow [place] Gram Stain Report Called to,Read Back By and Verified With: [place]. 1430. 10/23/2020 GAA Performed at [facility], [address] [place], [place] [address]    Culture ESCHERICHIA COLI (A)  Final   Report Status PENDING  Incomplete   Organism ID, Bacteria ESCHERICHIA COLI  Final      Susceptibility   Escherichia coli - MIC*    AMPICILLIN >=32 RESISTANT Resistant     CEFAZOLIN <=4 SENSITIVE Sensitive     CEFEPIME <=0.12 SENSITIVE Sensitive     CEFTAZIDIME <=1 SENSITIVE Sensitive     CEFTRIAXONE <=0.25 SENSITIVE Sensitive     CIPROFLOXACIN <=0.25 SENSITIVE Sensitive     GENTAMICIN <=1 SENSITIVE Sensitive     IMIPENEM <=0.25 SENSITIVE Sensitive     TRIMETH/SULFA <=20 SENSITIVE Sensitive     AMPICILLIN/SULBACTAM 16 INTERMEDIATE Intermediate  PIP/TAZO <=4 SENSITIVE Sensitive     * ESCHERICHIA COLI  Resp Panel by RT-PCR (Flu A&B, Covid) Nasopharyngeal Swab     Status: None   Collection Time: 10/22/20  4:57 PM   Specimen: Nasopharyngeal Swab; Nasopharyngeal(NP) swabs in vial transport medium  Result Value Ref Range Status   SARS Coronavirus 2 by RT PCR NEGATIVE NEGATIVE Final    Comment: (NOTE) SARS-CoV-2 target nucleic acids are NOT DETECTED.  The  SARS-CoV-2 RNA is generally detectable in upper respiratory specimens during the acute phase of infection. The lowest concentration of SARS-CoV-2 viral copies this assay can detect is 138 copies/mL. A negative result does not preclude SARS-Cov-2 infection and should not be used as the sole basis for treatment or other patient management decisions. A negative result may occur with  improper specimen collection/handling, submission of specimen other than nasopharyngeal swab, presence of viral mutation(s) within the areas targeted by this assay, and inadequate number of viral copies(<138 copies/mL). A negative result must be combined with clinical observations, patient history, and epidemiological information. The expected result is Negative.  Fact Sheet for Patients:  EntrepreneurPulse.com.au  Fact Sheet for Healthcare Providers:  IncredibleEmployment.be  This test is no t yet approved or cleared by the [place] FDA and  has been authorized for detection and/or diagnosis of SARS-CoV-2 by FDA under an Emergency Use Authorization (EUA). This EUA will remain  in effect (meaning this test can be used) for the duration of the COVID-19 declaration under Section 564(b)(1) of the Act, 21 U.S.C.section 360bbb-3(b)(1), unless the authorization is terminated  or revoked sooner.       Influenza A by PCR NEGATIVE NEGATIVE Final   Influenza B by PCR NEGATIVE NEGATIVE Final    Comment: (NOTE) The Xpert Xpress SARS-CoV-2/FLU/RSV plus assay is intended as an aid in the diagnosis of influenza from Nasopharyngeal swab specimens and should not be used as a sole basis for treatment. Nasal washings and aspirates are unacceptable for Xpert Xpress SARS-CoV-2/FLU/RSV testing.  Fact Sheet for Patients: EntrepreneurPulse.com.au  Fact Sheet for Healthcare Providers: IncredibleEmployment.be  This test is not yet approved or  cleared by the [place] FDA and has been authorized for detection and/or diagnosis of SARS-CoV-2 by FDA under an Emergency Use Authorization (EUA). This EUA will remain in effect (meaning this test can be used) for the duration of the COVID-19 declaration under Section 564(b)(1) of the Act, 21 U.S.C. section 360bbb-3(b)(1), unless the authorization is terminated or revoked.  Performed at [facility], [address] [place], [place] [address]   Blood Culture ID Panel (Reflexed)     Status: Abnormal   Collection Time: 10/22/20  4:57 PM  Result Value Ref Range Status   Enterococcus faecalis NOT DETECTED NOT DETECTED Final   Enterococcus Faecium NOT DETECTED NOT DETECTED Final   Listeria monocytogenes NOT DETECTED NOT DETECTED Final   Staphylococcus species NOT DETECTED NOT DETECTED Final   Staphylococcus aureus (BCID) NOT DETECTED NOT DETECTED Final   Staphylococcus epidermidis NOT DETECTED NOT DETECTED Final   Staphylococcus lugdunensis NOT DETECTED NOT DETECTED Final   Streptococcus species NOT DETECTED NOT DETECTED Final   Streptococcus agalactiae NOT DETECTED NOT DETECTED Final   Streptococcus pneumoniae NOT DETECTED NOT DETECTED Final   Streptococcus pyogenes NOT DETECTED NOT DETECTED Final   A.calcoaceticus-baumannii NOT DETECTED NOT DETECTED Final   Bacteroides fragilis NOT DETECTED NOT DETECTED Final   Enterobacterales DETECTED (A) NOT DETECTED Final    Comment: Enterobacterales represent a large order of gram negative  bacteria, not a single organism. CRITICAL RESULT CALLED TO, READ BACK BY AND VERIFIED WITH:  [place]. 1430 10/23/2020. GAA    Enterobacter cloacae complex NOT DETECTED NOT DETECTED Final   Escherichia coli DETECTED (A) NOT DETECTED Final    Comment: CRITICAL RESULT CALLED TO, READ BACK BY AND VERIFIED WITH:  SUSAN WATSON PHARMD. 1430. 10/23/2020    Klebsiella aerogenes NOT DETECTED NOT DETECTED Final   Klebsiella oxytoca NOT  DETECTED NOT DETECTED Final   Klebsiella pneumoniae NOT DETECTED NOT DETECTED Final   Proteus species NOT DETECTED NOT DETECTED Final   Salmonella species NOT DETECTED NOT DETECTED Final   Serratia marcescens NOT DETECTED NOT DETECTED Final   Haemophilus influenzae NOT DETECTED NOT DETECTED Final   Neisseria meningitidis NOT DETECTED NOT DETECTED Final   Pseudomonas aeruginosa NOT DETECTED NOT DETECTED Final   Stenotrophomonas maltophilia NOT DETECTED NOT DETECTED Final   Candida albicans NOT DETECTED NOT DETECTED Final   Candida auris NOT DETECTED NOT DETECTED Final   Candida glabrata NOT DETECTED NOT DETECTED Final   Candida krusei NOT DETECTED NOT DETECTED Final   Candida parapsilosis NOT DETECTED NOT DETECTED Final   Candida tropicalis NOT DETECTED NOT DETECTED Final   Cryptococcus neoformans/gattii NOT DETECTED NOT DETECTED Final   CTX-M ESBL NOT DETECTED NOT DETECTED Final   Carbapenem resistance IMP NOT DETECTED NOT DETECTED Final   Carbapenem resistance KPC NOT DETECTED NOT DETECTED Final   Carbapenem resistance NDM NOT DETECTED NOT DETECTED Final   Carbapenem resist OXA 48 LIKE NOT DETECTED NOT DETECTED Final   Carbapenem resistance VIM NOT DETECTED NOT DETECTED Final    Comment: Performed at [facility], [address] [place], [place] [address]  Urine Culture     Status: Abnormal   Collection Time: 10/22/20  6:50 PM   Specimen: In/Out Cath Urine  Result Value Ref Range Status   Specimen Description   Final    IN/OUT CATH URINE Performed at [facility], [address] [place], [place] [address]    Special Requests   Final    NONE Performed at [facility], [address] [place], [place] [address]    Culture MULTIPLE SPECIES PRESENT, SUGGEST RECOLLECTION (A)  Final   Report Status 10/24/2020 FINAL  Final  Blood culture (routine x 2)     Status: None (Preliminary result)   Collection Time: 10/23/20 12:08 AM   Specimen: BLOOD  Result  Value Ref Range Status   Specimen Description BLOOD LEFT ASSIST CONTROL  Final   Special Requests   Final    BOTTLES DRAWN AEROBIC AND ANAEROBIC Blood Culture results may not be optimal due to an inadequate volume of blood received in culture bottles   Culture   Final    NO GROWTH 3 DAYS Performed at [facility], [address] [place], [place] [address]    Report Status PENDING  Incomplete  Resp Panel by RT-PCR (Flu A&B, Covid) Nasopharyngeal Swab     Status: None   Collection Time: 10/25/20 11:07 AM   Specimen: Nasopharyngeal Swab; Nasopharyngeal(NP) swabs in vial transport medium  Result Value Ref Range Status   SARS Coronavirus 2 by RT PCR NEGATIVE NEGATIVE Final    Comment: (NOTE) SARS-CoV-2 target nucleic acids are NOT DETECTED.  The SARS-CoV-2 RNA is generally detectable in upper respiratory specimens during the acute phase of infection. The lowest concentration of SARS-CoV-2 viral copies this assay can detect is 138 copies/mL. A negative result does not preclude  SARS-Cov-2 infection and should not be used as the sole basis for treatment or other patient management decisions. A negative result may occur with  improper specimen collection/handling, submission of specimen other than nasopharyngeal swab, presence of viral mutation(s) within the areas targeted by this assay, and inadequate number of viral copies(<138 copies/mL). A negative result must be combined with clinical observations, patient history, and epidemiological information. The expected result is Negative.  Fact Sheet for Patients:  EntrepreneurPulse.com.au  Fact Sheet for Healthcare Providers:  IncredibleEmployment.be  This test is no t yet approved or cleared by the [place] FDA and  has been authorized for detection and/or diagnosis of SARS-CoV-2 by FDA under an Emergency Use Authorization (EUA). This EUA will remain  in effect (meaning this test can be  used) for the duration of the COVID-19 declaration under Section 564(b)(1) of the Act, 21 U.S.C.section 360bbb-3(b)(1), unless the authorization is terminated  or revoked sooner.       Influenza A by PCR NEGATIVE NEGATIVE Final   Influenza B by PCR NEGATIVE NEGATIVE Final    Comment: (NOTE) The Xpert Xpress SARS-CoV-2/FLU/RSV plus assay is intended as an aid in the diagnosis of influenza from Nasopharyngeal swab specimens and should not be used as a sole basis for treatment. Nasal washings and aspirates are unacceptable for Xpert Xpress SARS-CoV-2/FLU/RSV testing.  Fact Sheet for Patients: EntrepreneurPulse.com.au  Fact Sheet for Healthcare Providers: IncredibleEmployment.be  This test is not yet approved or cleared by the [place] FDA and has been authorized for detection and/or diagnosis of SARS-CoV-2 by FDA under an Emergency Use Authorization (EUA). This EUA will remain in effect (meaning this test can be used) for the duration of the COVID-19 declaration under Section 564(b)(1) of the Act, 21 U.S.C. section 360bbb-3(b)(1), unless the authorization is terminated or revoked.  Performed at [facility], [address] [place], [place] [address]      Time coordinating discharge: Over 30 minutes  SIGNED:   Sidney Ace, MD  [facility] 10/26/2020, 8:56 AM Pager   If 7PM-7AM, please contact night-coverage

## 2020-10-26 NOTE — Progress Notes (Signed)
VSS, IV cath removed site is c/d/I. Report called to [facility] and report given to Lynn,RN. Pt transported by [facility] with all belongings to facility

## 2020-10-26 NOTE — Plan of Care (Signed)
No acute events overnight. Problem: Education: Goal: Knowledge of General Education information will improve Description: Including pain rating scale, medication(s)/side effects and non-pharmacologic comfort measures Outcome: Progressing   Problem: Health Behavior/Discharge Planning: Goal: Ability to manage health-related needs will improve Outcome: Progressing   Problem: Clinical Measurements: Goal: Ability to maintain clinical measurements within normal limits will improve Outcome: Progressing Goal: Will remain free from infection Outcome: Progressing Goal: Diagnostic test results will improve Outcome: Progressing Goal: Respiratory complications will improve Outcome: Progressing Goal: Cardiovascular complication will be avoided Outcome: Progressing   Problem: Activity: Goal: Risk for activity intolerance will decrease Outcome: Progressing   Problem: Nutrition: Goal: Adequate nutrition will be maintained Outcome: Progressing   Problem: Coping: Goal: Level of anxiety will decrease Outcome: Progressing   Problem: Elimination: Goal: Will not experience complications related to bowel motility Outcome: Progressing Goal: Will not experience complications related to urinary retention Outcome: Progressing   Problem: Pain Managment: Goal: General experience of comfort will improve Outcome: Progressing   Problem: Safety: Goal: Ability to remain free from injury will improve Outcome: Progressing   Problem: Skin Integrity: Goal: Risk for impaired skin integrity will decrease Outcome: Progressing   Problem: Urinary Elimination: Goal: Signs and symptoms of infection will decrease Outcome: Progressing

## 2020-10-27 ENCOUNTER — Encounter: Payer: Self-pay | Admitting: Hematology and Oncology

## 2020-10-27 ENCOUNTER — Ambulatory Visit: Payer: Medicare Other

## 2020-10-27 ENCOUNTER — Other Ambulatory Visit: Payer: Medicare Other

## 2020-10-27 LAB — CULTURE, BLOOD (SINGLE)

## 2020-10-30 ENCOUNTER — Telehealth: Payer: Self-pay | Admitting: *Deleted

## 2020-10-30 ENCOUNTER — Other Ambulatory Visit: Payer: Self-pay | Admitting: Internal Medicine

## 2020-10-30 ENCOUNTER — Ambulatory Visit: Payer: Self-pay | Admitting: *Deleted

## 2020-10-30 DIAGNOSIS — R2689 Other abnormalities of gait and mobility: Secondary | ICD-10-CM | POA: Diagnosis not present

## 2020-10-30 DIAGNOSIS — N179 Acute kidney failure, unspecified: Secondary | ICD-10-CM | POA: Diagnosis not present

## 2020-10-30 DIAGNOSIS — R41841 Cognitive communication deficit: Secondary | ICD-10-CM | POA: Diagnosis not present

## 2020-10-30 DIAGNOSIS — N183 Chronic kidney disease, stage 3 unspecified: Secondary | ICD-10-CM | POA: Diagnosis not present

## 2020-10-30 DIAGNOSIS — D631 Anemia in chronic kidney disease: Secondary | ICD-10-CM | POA: Diagnosis not present

## 2020-10-30 DIAGNOSIS — E559 Vitamin D deficiency, unspecified: Secondary | ICD-10-CM | POA: Diagnosis not present

## 2020-10-30 DIAGNOSIS — N3001 Acute cystitis with hematuria: Secondary | ICD-10-CM | POA: Diagnosis not present

## 2020-10-30 DIAGNOSIS — R600 Localized edema: Secondary | ICD-10-CM | POA: Diagnosis not present

## 2020-10-30 DIAGNOSIS — N1832 Chronic kidney disease, stage 3b: Secondary | ICD-10-CM | POA: Diagnosis not present

## 2020-10-30 DIAGNOSIS — G9341 Metabolic encephalopathy: Secondary | ICD-10-CM | POA: Diagnosis not present

## 2020-10-30 DIAGNOSIS — E860 Dehydration: Secondary | ICD-10-CM | POA: Diagnosis not present

## 2020-10-30 DIAGNOSIS — R4189 Other symptoms and signs involving cognitive functions and awareness: Secondary | ICD-10-CM | POA: Diagnosis not present

## 2020-10-30 DIAGNOSIS — D45 Polycythemia vera: Secondary | ICD-10-CM | POA: Diagnosis not present

## 2020-10-30 DIAGNOSIS — M6259 Muscle wasting and atrophy, not elsewhere classified, multiple sites: Secondary | ICD-10-CM | POA: Diagnosis not present

## 2020-10-30 DIAGNOSIS — N390 Urinary tract infection, site not specified: Secondary | ICD-10-CM | POA: Diagnosis not present

## 2020-10-30 DIAGNOSIS — M6281 Muscle weakness (generalized): Secondary | ICD-10-CM | POA: Diagnosis not present

## 2020-10-30 DIAGNOSIS — B9620 Unspecified Escherichia coli [E. coli] as the cause of diseases classified elsewhere: Secondary | ICD-10-CM | POA: Diagnosis not present

## 2020-10-30 DIAGNOSIS — R279 Unspecified lack of coordination: Secondary | ICD-10-CM | POA: Diagnosis not present

## 2020-10-30 DIAGNOSIS — R531 Weakness: Secondary | ICD-10-CM | POA: Diagnosis not present

## 2020-10-30 DIAGNOSIS — N2581 Secondary hyperparathyroidism of renal origin: Secondary | ICD-10-CM | POA: Diagnosis not present

## 2020-10-30 DIAGNOSIS — E440 Moderate protein-calorie malnutrition: Secondary | ICD-10-CM | POA: Diagnosis not present

## 2020-10-30 NOTE — Telephone Encounter (Signed)
See triage notes for all the details of this call from daughter Laura Wilcox.   Mother is in a Rehab facility.   Laura Wilcox wants to bring her in to be seen by Dr. Army Wilcox.   She is allowed to take her mother out of rehab to come to the doctor and has help today so could get her mother to the office.

## 2020-10-30 NOTE — Telephone Encounter (Addendum)
Daughter  [name] called reporting that patient has been in hospital and is now in [place]. She states that patient hgb/hct has been low ans that the rehab facility does not check labs on her. She is asking if patient can be seen and have labs checked. I do see that she has an appointment 11/10/20 for lab injection. Please advise

## 2020-10-30 NOTE — Telephone Encounter (Signed)
Reason for Disposition  [1] MODERATE leg swelling (e.g., swelling extends up to knees) AND [2] new-onset or worsening    See notes for details.   Daughter wants to bring her in today if at all possible.  Answer Assessment - Initial Assessment Questions 1. ONSET: "When did the swelling start?" (e.g., minutes, hours, days)     I returned the call to daughter Laura Wilcox.  She had called in concerned that her mother's legs are swollen.  Her mother is in a rehab facility 2 miles down the road from Dr. Gaspar Cola office.    2. LOCATION: "What part of the leg is swollen?"  "Are both legs swollen or just one leg?"     July 24 this all started per [place].   Her mother ended up with a UTI with E coli and was admitted to the hospital for 4 days because the infection got into her bloodstream.    She got a lot of IV fluids also on IV antibiotics.    July 28 they put her on Cipro and then sent her to rehab.   She's been in rehab for 4 days now.   Yesterday her legs started swelling.   She's never had that before.    Her ankles are purple and swollen.   Knees and thighs are swollen with fluid.    My daughter-in-law is a Marine scientist.  She  evaluated her legs and has non pitting edema and a negative Homan's sign.   I just want to know what to do.   She has chronic dehydration.  Her kidney dr is on vacation all this week.     She got a lot of IV fluids in the ED/hospital.    She is coughing mostly at night.   Nothing coming up.   Her pulse was in the 80's 2:00 AM.   She coughed and got it up into the 90's but it's not being monitored so I don't know how her readings are doing today.   She is very fatigued.   Pt got upset with PT did not want to take a shower this morning.   She is so fatigued.  [name] can bring her in to the office today.    I have help with me today so we could get her in via a wheelchair.  Daughter feels like her mother is not getting good care at the Rehab facility she is in.  [name] has contacted the  nephrologist, who is out of the office all this week.   She also contacted the [facility] in [place].  [name] also mentioned that her mother's H&H was low at one point and she got a blood transfusion and it really seemed to help her mother a lot when she was experiencing fatigue  This was in the past.   My mother's legs being swollen makes me wonder if she has fluid overload.   Denies her mother having shortness of breath but her pulse ox reading in the rehab was in the 80's when they checked it at 2:00 AM this morning.  After they had her mother cough and bring up some mucus and take some deep breaths the pulse ox came up into the 90's.   Then they stopped measuring it so I don't know what her readings are now.   I'm getting a family member to bring in a pulse oximeter from home so I can check it.   They are not giving her good  care here at this rehab.    I'm really leaning on Dr. Army Melia now because I don't know what else to do.   If she accesses my mom's records she can probably see all her tests, etc.    There is a PA at the [facility] that I am very unhappy with.   I would prefer Dr. Army Melia, if she needs to talk with someone from the Oncology center to talk with Faythe Casa, PA.   She's is excellent.   I've had great experiences with her and she has been great with my mother.   There is a PA Beckey Rutter that was very rude and cold towards [place] that I do not want to have any more dealings with.   [name] really wants to know if there is anyway her mother can be brought in today and seen by Dr. Army Melia.   "I feel like I'm at the end of my rope with what to do".   "This is not normal for her legs to be swollen like this". Laura Wilcox can be reached at [phone].  3. SEVERITY: "How bad is the swelling?" (e.g., localized; mild, moderate, severe)  - Localized - small area of swelling localized to one leg  - MILD pedal edema - swelling limited to foot and ankle, pitting  edema < 1/4 inch (6 mm) deep, rest and elevation eliminate most or all swelling  - MODERATE edema - swelling of lower leg to knee, pitting edema > 1/4 inch (6 mm) deep, rest and elevation only partially reduce swelling  - SEVERE edema - swelling extends above knee, facial or hand swelling present      *No Answer* 4. REDNESS: "Does the swelling look red or infected?"     *No Answer* 5. PAIN: "Is the swelling painful to touch?" If Yes, ask: "How painful is it?"   (Scale 1-10; mild, moderate or severe)     *No Answer* 6. FEVER: "Do you have a fever?" If Yes, ask: "What is it, how was it measured, and when did it start?"      *No Answer* 7. CAUSE: "What do you think is causing the leg swelling?"     *No Answer* 8. MEDICAL HISTORY: "Do you have a history of heart failure, kidney disease, liver failure, or cancer?"     *No Answer* 9. RECURRENT SYMPTOM: "Have you had leg swelling before?" If Yes, ask: "When was the last time?" "What happened that time?"     *No Answer* 10. OTHER SYMPTOMS: "Do you have any other symptoms?" (e.g., chest pain, difficulty breathing)       *No Answer* 11. PREGNANCY: "Is there any chance you are pregnant?" "When was your last menstrual period?"       *No Answer*  Protocols used: Leg Swelling and Edema-A-AH

## 2020-10-30 NOTE — Telephone Encounter (Signed)
Left voice mail to have patient set up appointment for tomorrow with Dr Army Melia in the am with a 40 min slot patient will have labs done.

## 2020-10-31 ENCOUNTER — Ambulatory Visit (INDEPENDENT_AMBULATORY_CARE_PROVIDER_SITE_OTHER): Payer: Medicare Other | Admitting: Internal Medicine

## 2020-10-31 ENCOUNTER — Encounter: Payer: Self-pay | Admitting: Internal Medicine

## 2020-10-31 ENCOUNTER — Other Ambulatory Visit: Payer: Self-pay

## 2020-10-31 ENCOUNTER — Ambulatory Visit: Payer: Medicare Other | Admitting: Internal Medicine

## 2020-10-31 VITALS — BP 122/66 | HR 60 | Temp 97.8°F | Ht 67.0 in

## 2020-10-31 DIAGNOSIS — E440 Moderate protein-calorie malnutrition: Secondary | ICD-10-CM

## 2020-10-31 DIAGNOSIS — N179 Acute kidney failure, unspecified: Secondary | ICD-10-CM | POA: Diagnosis not present

## 2020-10-31 DIAGNOSIS — R4189 Other symptoms and signs involving cognitive functions and awareness: Secondary | ICD-10-CM

## 2020-10-31 DIAGNOSIS — N1832 Chronic kidney disease, stage 3b: Secondary | ICD-10-CM

## 2020-10-31 DIAGNOSIS — R600 Localized edema: Secondary | ICD-10-CM

## 2020-10-31 DIAGNOSIS — R531 Weakness: Secondary | ICD-10-CM | POA: Diagnosis not present

## 2020-10-31 DIAGNOSIS — N3001 Acute cystitis with hematuria: Secondary | ICD-10-CM | POA: Diagnosis not present

## 2020-10-31 LAB — CULTURE, BLOOD (ROUTINE X 2): Culture: NO GROWTH

## 2020-10-31 NOTE — Telephone Encounter (Signed)
Sure can we add labs for that day and visit?  Faythe Casa, NP 10/31/2020 10:08 AM

## 2020-10-31 NOTE — Progress Notes (Signed)
Date:  10/31/2020   Name:  Laura Wilcox   DOB:  [birth year]   MRN:  [id number]   Chief Complaint: Edema (Bilateral legs.) and Altered Mental Status (Anxiety, panic, and repeated questioning that's going in circles. This is a new problem. ) Hospital follow up and leg swelling. Admitted to [facility] 7/24 to 10/26/20.  Discharge Diagnoses:  Active Problems:   Acute metabolic encephalopathy Acute metabolic encephalopathy secondary to uncomplicated urinary tract infection: E. coli bacteremia secondary to above Sepsis secondary to above Likely mental status changes driven by UTI and associated bacteremia Sepsis criteria met with tachypnea leukocytosis Blood cultures with E. coli, pansensitive Discharge plan: Start ciprofloxacin, pharmacy dosing.  500 mg daily x7 total days Stable for discharge to skilled nursing facility   >>> Family is concerned about use of Cipro.  Review of sensitivities show IV antibiotics in addition to Cipro and Bactrim.  Today is her last day of Cipro.  Will check UA today.  Acute kidney injury on chronic kidney disease Suspect prerenal azotemia secondary to dehydration in setting of infection Kidney function is improving Approaching baseline at time of discharge Follow-up outpatient nephrology Ensure adequate p.o. fluid intake   >>> BUN 20 yesterday; CR 1.3 down from 24/1/45 at discharge  Hypovolemic hyponatremia, resolved Plan as above.  >>> Potassium 4.4 yesterday.  Hypokalemia Improved  >>> resolved.  4.4 yesterday.  Polycythemia vera: No acute treatment.  Outpatient follow-up with oncology.  HPI  Lab Results  Component Value Date   CREATININE 1.45 (H) 10/25/2020   BUN 24 (H) 10/25/2020   NA 139 10/25/2020   K 4.1 10/25/2020   CL 108 10/25/2020   CO2 22 10/25/2020   Lab Results  Component Value Date   CHOL 279 (H) 05/27/2018   HDL 72 05/27/2018   LDLCALC 182 (H) 05/27/2018   TRIG 125 05/27/2018   CHOLHDL 3.9 05/27/2018   Lab Results   Component Value Date   TSH 2.085 12/20/2019   No results found for: HGBA1C Lab Results  Component Value Date   WBC 11.9 (H) 10/25/2020   HGB 8.5 (L) 10/25/2020   HCT 26.8 (L) 10/25/2020   MCV 92.1 10/25/2020   PLT 412 (H) 10/25/2020   Lab Results  Component Value Date   ALT 17 10/22/2020   AST 19 10/22/2020   ALKPHOS 68 10/22/2020   BILITOT 0.8 10/22/2020     Review of Systems  Constitutional:  Negative for chills and diaphoresis.  HENT:  Negative for trouble swallowing.   Respiratory:  Negative for cough, choking and shortness of breath.   Cardiovascular:  Positive for leg swelling. Negative for chest pain.  Musculoskeletal:  Positive for arthralgias and gait problem.  Skin:  Negative for wound.  Neurological:  Positive for weakness. Negative for dizziness and headaches.  Psychiatric/Behavioral:  Positive for agitation (at times) and confusion. Negative for hallucinations and sleep disturbance.    Patient Active Problem List   Diagnosis Date Noted   Acute metabolic encephalopathy 37/62/8315   Renal insufficiency 03/14/2020   Dehydration 03/12/2020   Myelofibrosis ([place]) 01/31/2020   Other pancytopenia ([place]) 01/31/2020   Squamous cell cancer of skin of left cheek 01/16/2020   Chronic kidney disease, stage 3b ([place]) 03/11/2019   Secondary hyperparathyroidism of renal origin ([place]) 03/11/2019   Anemia in chronic kidney disease 03/11/2019   Generalized weakness 12/17/2018   Elevated ferritin 02/18/2018   Goals of care, counseling/discussion 07/09/2017   Polycythemia rubra vera ([place]) 06/02/2017   Underweight 06/02/2017  Advanced care planning/counseling discussion 06/02/2017   Cognitive impairment 06/02/2017    Allergies  Allergen Reactions   Bactrim [Sulfamethoxazole-Trimethoprim]     Due to kidney disease     Past Surgical History:  Procedure Laterality Date   ABDOMINAL HYSTERECTOMY     complete   BREAST SURGERY      Social History   Tobacco Use    Smoking status: Never   Smokeless tobacco: Never  Vaping Use   Vaping Use: Never used  Substance Use Topics   Alcohol use: No    Comment: occasional glass of wine   Drug use: No     Medication list has been reviewed and updated.  Current Meds  Medication Sig   acetaminophen (TYLENOL) 325 MG tablet Take 325-650 mg by mouth every 6 (six) hours as needed for mild pain or moderate pain.   acidophilus (RISAQUAD) CAPS capsule Take 1 capsule by mouth daily.   calcitRIOL (ROCALTROL) 0.25 MCG capsule Take 0.25 mcg by mouth every Monday, Wednesday, and Friday.   feeding supplement (ENSURE ENLIVE / ENSURE PLUS) LIQD Take 237 mLs by mouth 2 (two) times daily between meals.   mupirocin ointment (BACTROBAN) 2 % Apply to any open sores daily.   ruxolitinib phosphate (JAKAFI) 15 MG tablet Take 1 tablet (15 mg total) by mouth 2 (two) times daily.   [DISCONTINUED] ciprofloxacin (CIPRO) 500 MG tablet Take 500 mg by mouth daily with breakfast.    PHQ 2/9 Scores 09/19/2020 07/04/2020 03/28/2020 01/31/2020  PHQ - 2 Score 0 0 0 0  PHQ- 9 Score 0 0 0 0    GAD 7 : Generalized Anxiety Score 09/19/2020 07/04/2020 03/28/2020 01/31/2020  Nervous, Anxious, on Edge 0 0 0 0  Control/stop worrying 0 0 0 0  Worry too much - different things 0 0 0 0  Trouble relaxing 0 0 0 0  Restless 0 0 0 0  Easily annoyed or irritable 0 0 0 0  Afraid - awful might happen 0 0 0 0  Total GAD 7 Score 0 0 0 0  Anxiety Difficulty - - Not difficult at all Not difficult at all    BP Readings from Last 3 Encounters:  10/31/20 122/66  10/26/20 (!) 152/67  10/13/20 (!) 119/55    Physical Exam Constitutional:      General: She is not in acute distress. Cardiovascular:     Rate and Rhythm: Normal rate and regular rhythm.  Pulmonary:     Effort: Pulmonary effort is normal.     Breath sounds: No wheezing or rhonchi.  Abdominal:     General: Abdomen is flat.     Palpations: Abdomen is soft.     Tenderness: There is no abdominal  tenderness. There is no right CVA tenderness or left CVA tenderness.  Musculoskeletal:        General: Swelling (mild non pitting edema both lower legs with focal areas around lateral ankle) present.  Lymphadenopathy:     Cervical: No cervical adenopathy.  Skin:    General: Skin is warm and dry.  Neurological:     Mental Status: She is alert.     Comments: HOH, oriented to self only.  Pleasant and cooperative.    Wt Readings from Last 3 Encounters:  10/23/20 112 lb 7 oz (51 kg)  10/13/20 111 lb (50.3 kg)  09/19/20 112 lb 6.4 oz (51 kg)    BP 122/66   Pulse 60   Temp 97.8 F (36.6 C) (Oral)   Ht  5' 7"  (1.702 m)   BMI 17.61 kg/m   Assessment and Plan: 1. Acute renal failure superimposed on stage 3b chronic kidney disease, unspecified acute renal failure type ([place]) Labs done yesterday by Nephrology show improvement. Continue to follow up with Nephrology; continue oral fluid replacement  2. Moderate protein-calorie malnutrition (HCC) Low albumin and low body weight on exam Daughter plans to encourage intake of Ensure or Boost regularly along with a balanced diet - Ambulatory referral to [facility]  3. Acute cystitis with hematuria UA today is clear She completed 7 days of oral Cipro after IV Rocephin. No indication for further antibiotics at this time.  4. Generalized weakness Due to recent illness and hospitalization Not getting good care at the SNF - daughter will take her home and we will order Home Health - Ambulatory referral to [place]  5. Lower leg edema Mild at this time Continue elevation; protein rich supplements  6. Cognitive impairment Agitation and combativeness should improve once she returns home to familiar surroundings Daughter plans to stay with her as much as is needed. Patient has been referred to Neurology but has been resistant to evaluation several times in the past. Continue supportive care for now.   Partially dictated using Radio producer. Any errors are unintentional.  Halina Maidens, MD [facility]  10/31/2020

## 2020-11-03 ENCOUNTER — Telehealth: Payer: Self-pay

## 2020-11-03 NOTE — Telephone Encounter (Signed)
Copied from [place] [id number]. Topic: General - Other >> Nov 03, 2020 12:54 PM Yvette Rack wrote: Reason for CRM: Pt daughter Laura Wilcox called for an update on the referral request for home health services because they have yet to hear from anyone. Laura Wilcox also requests update on handicap placard request. Laura Wilcox requests call back

## 2020-11-03 NOTE — Telephone Encounter (Signed)
Patient informed. 

## 2020-11-03 NOTE — Telephone Encounter (Signed)
Patient daughter informed placard paper ready and home health referral is placed.

## 2020-11-06 ENCOUNTER — Inpatient Hospital Stay: Payer: Medicare Other | Attending: Oncology

## 2020-11-06 ENCOUNTER — Telehealth: Payer: Self-pay | Admitting: Oncology

## 2020-11-06 ENCOUNTER — Other Ambulatory Visit: Payer: Self-pay

## 2020-11-06 ENCOUNTER — Telehealth: Payer: Self-pay | Admitting: *Deleted

## 2020-11-06 ENCOUNTER — Inpatient Hospital Stay (HOSPITAL_BASED_OUTPATIENT_CLINIC_OR_DEPARTMENT_OTHER): Payer: Medicare Other | Admitting: Hospice and Palliative Medicine

## 2020-11-06 ENCOUNTER — Other Ambulatory Visit: Payer: Self-pay | Admitting: Lab

## 2020-11-06 VITALS — BP 105/67 | HR 87 | Temp 97.2°F | Resp 17 | Wt 114.5 lb

## 2020-11-06 DIAGNOSIS — Z803 Family history of malignant neoplasm of breast: Secondary | ICD-10-CM | POA: Insufficient documentation

## 2020-11-06 DIAGNOSIS — D539 Nutritional anemia, unspecified: Secondary | ICD-10-CM | POA: Diagnosis not present

## 2020-11-06 DIAGNOSIS — Z7401 Bed confinement status: Secondary | ICD-10-CM | POA: Diagnosis not present

## 2020-11-06 DIAGNOSIS — N1832 Chronic kidney disease, stage 3b: Secondary | ICD-10-CM | POA: Insufficient documentation

## 2020-11-06 DIAGNOSIS — D709 Neutropenia, unspecified: Secondary | ICD-10-CM | POA: Insufficient documentation

## 2020-11-06 DIAGNOSIS — D75839 Thrombocytosis, unspecified: Secondary | ICD-10-CM | POA: Insufficient documentation

## 2020-11-06 DIAGNOSIS — Z87440 Personal history of urinary (tract) infections: Secondary | ICD-10-CM | POA: Diagnosis not present

## 2020-11-06 DIAGNOSIS — Z515 Encounter for palliative care: Secondary | ICD-10-CM | POA: Diagnosis not present

## 2020-11-06 DIAGNOSIS — R4189 Other symptoms and signs involving cognitive functions and awareness: Secondary | ICD-10-CM | POA: Insufficient documentation

## 2020-11-06 DIAGNOSIS — D45 Polycythemia vera: Secondary | ICD-10-CM

## 2020-11-06 DIAGNOSIS — R531 Weakness: Secondary | ICD-10-CM

## 2020-11-06 DIAGNOSIS — K449 Diaphragmatic hernia without obstruction or gangrene: Secondary | ICD-10-CM | POA: Insufficient documentation

## 2020-11-06 DIAGNOSIS — Z85828 Personal history of other malignant neoplasm of skin: Secondary | ICD-10-CM | POA: Diagnosis not present

## 2020-11-06 DIAGNOSIS — Z881 Allergy status to other antibiotic agents status: Secondary | ICD-10-CM | POA: Insufficient documentation

## 2020-11-06 DIAGNOSIS — G9341 Metabolic encephalopathy: Secondary | ICD-10-CM | POA: Diagnosis not present

## 2020-11-06 DIAGNOSIS — R0600 Dyspnea, unspecified: Secondary | ICD-10-CM | POA: Insufficient documentation

## 2020-11-06 DIAGNOSIS — D631 Anemia in chronic kidney disease: Secondary | ICD-10-CM | POA: Diagnosis not present

## 2020-11-06 DIAGNOSIS — D7581 Myelofibrosis: Secondary | ICD-10-CM | POA: Diagnosis not present

## 2020-11-06 DIAGNOSIS — N179 Acute kidney failure, unspecified: Secondary | ICD-10-CM | POA: Insufficient documentation

## 2020-11-06 DIAGNOSIS — Z818 Family history of other mental and behavioral disorders: Secondary | ICD-10-CM | POA: Diagnosis not present

## 2020-11-06 DIAGNOSIS — Z8249 Family history of ischemic heart disease and other diseases of the circulatory system: Secondary | ICD-10-CM | POA: Insufficient documentation

## 2020-11-06 DIAGNOSIS — R5383 Other fatigue: Secondary | ICD-10-CM | POA: Insufficient documentation

## 2020-11-06 DIAGNOSIS — E46 Unspecified protein-calorie malnutrition: Secondary | ICD-10-CM | POA: Diagnosis not present

## 2020-11-06 DIAGNOSIS — F0390 Unspecified dementia without behavioral disturbance: Secondary | ICD-10-CM | POA: Diagnosis not present

## 2020-11-06 DIAGNOSIS — N390 Urinary tract infection, site not specified: Secondary | ICD-10-CM

## 2020-11-06 LAB — COMPREHENSIVE METABOLIC PANEL
ALT: 10 U/L (ref 0–44)
AST: 15 U/L (ref 15–41)
Albumin: 3.3 g/dL — ABNORMAL LOW (ref 3.5–5.0)
Alkaline Phosphatase: 70 U/L (ref 38–126)
Anion gap: 9 (ref 5–15)
BUN: 27 mg/dL — ABNORMAL HIGH (ref 8–23)
CO2: 27 mmol/L (ref 22–32)
Calcium: 8.5 mg/dL — ABNORMAL LOW (ref 8.9–10.3)
Chloride: 99 mmol/L (ref 98–111)
Creatinine, Ser: 1.2 mg/dL — ABNORMAL HIGH (ref 0.44–1.00)
GFR, Estimated: 43 mL/min — ABNORMAL LOW (ref >60)
Glucose, Bld: 157 mg/dL — ABNORMAL HIGH (ref 70–99)
Potassium: 3.7 mmol/L (ref 3.5–5.1)
Sodium: 135 mmol/L (ref 135–145)
Total Bilirubin: 0.4 mg/dL (ref 0.3–1.2)
Total Protein: 6.4 g/dL — ABNORMAL LOW (ref 6.5–8.1)

## 2020-11-06 LAB — URINALYSIS, COMPLETE (UACMP) WITH MICROSCOPIC
Bilirubin Urine: NEGATIVE
Glucose, UA: NEGATIVE mg/dL
Hgb urine dipstick: NEGATIVE
Ketones, ur: NEGATIVE mg/dL
Leukocytes,Ua: NEGATIVE
Nitrite: NEGATIVE
Protein, ur: NEGATIVE mg/dL
Specific Gravity, Urine: 1.016 (ref 1.005–1.030)
pH: 5 (ref 5.0–8.0)

## 2020-11-06 LAB — CBC WITH DIFFERENTIAL/PLATELET
Abs Immature Granulocytes: 0.27 10*3/uL — ABNORMAL HIGH (ref 0.00–0.07)
Basophils Absolute: 0.1 10*3/uL (ref 0.0–0.1)
Basophils Relative: 1 %
Eosinophils Absolute: 0.3 10*3/uL (ref 0.0–0.5)
Eosinophils Relative: 4 %
HCT: 27.3 % — ABNORMAL LOW (ref 36.0–46.0)
Hemoglobin: 8.3 g/dL — ABNORMAL LOW (ref 12.0–15.0)
Immature Granulocytes: 4 %
Lymphocytes Relative: 20 %
Lymphs Abs: 1.4 10*3/uL (ref 0.7–4.0)
MCH: 28.7 pg (ref 26.0–34.0)
MCHC: 30.4 g/dL (ref 30.0–36.0)
MCV: 94.5 fL (ref 80.0–100.0)
Monocytes Absolute: 0.5 10*3/uL (ref 0.1–1.0)
Monocytes Relative: 7 %
Neutro Abs: 4.6 10*3/uL (ref 1.7–7.7)
Neutrophils Relative %: 64 %
Platelets: 471 10*3/uL — ABNORMAL HIGH (ref 150–400)
RBC: 2.89 MIL/uL — ABNORMAL LOW (ref 3.87–5.11)
RDW: 23.2 % — ABNORMAL HIGH (ref 11.5–15.5)
WBC: 7.1 10*3/uL (ref 4.0–10.5)
nRBC: 0.6 % — ABNORMAL HIGH (ref 0.0–0.2)

## 2020-11-06 LAB — SAMPLE TO BLOOD BANK

## 2020-11-06 NOTE — Telephone Encounter (Signed)
Called patient's daughter to make her aware of change in appointments (per [employer] secure chat--patient to be seen in [place] next week for labs/MD/possible retacrit). Requested a call back to confirm.

## 2020-11-06 NOTE — Addendum Note (Signed)
Addended by: Irean Hong on: 11/06/2020 07:51 PM   Modules accepted: Orders

## 2020-11-06 NOTE — Progress Notes (Signed)
Pt was recently discharged from hospital and SNF. Daughter took pt out of SNF after 4 days due to unresolved issues. Daughter states that pt is more tired/fatigued and is agitated in the morning. Pt has been off Jakafi for about 2 weeks now, and she is also concerned about that.

## 2020-11-06 NOTE — Progress Notes (Signed)
Symptom Management [place]  [phone] Fax:([phone]) [phone]  Patient Care Team: Laura Hess, MD as PCP - General (Internal Medicine) Laura Legato, MD (Internal Medicine) Laura Asal, MD (Inactive) as Referring Physician (Hematology and Oncology) Laura Hess, MD (Internal Medicine)   Name of the patient: Laura Wilcox  [id number]  Jul 30, 1926   Date of visit: 11/06/20  Reason for Consult:  Laura Wilcox is a 87 year old woman with multiple medical problems including polycythemia vera on Jakafi, memory impairment, and chronic kidney disease.  Patient was recently hospitalized 10/22/2020 to 10/26/2020 with sepsis secondary to E. coli bacteremia/UTI.  Patient was discharged on Cipro.  Patient was discharged to [facility].  Patient last saw Laura Rutter, NP on 10/13/2020 and was noted to have several missed doses of Jakafi, which had recently been increased to 30 mg daily.  Apparently, patient lives at home alone but has some memory issues that impair medication compliance.  Patient had previously been receiving IV fluids at the [facility].   Since discharging from the hospital, patient spent a short amount of time at rehab with some improvement in her weakness.  Patient ultimately decided to return home from rehab on 10/31/2020.  Patient saw her PCP on 10/31/2020.  UA was checked and appeared clear without indication for further antibiotics.  Home health was ordered for generalized weakness.  However, they have yet to start providing care at home.    Daughter reports that since returning home, patient has been sleeping much of the day.  She has had periods of agitation when she wakes in the a.m. (~11AM).  Daughter has been staying with her 24/7.  Patient is requiring assistance with ADLs.  Oral intake has improved.  Patient is ambulatory but uses furniture for stability.  Patient denies any distressing symptoms today.  No  fever or chills.  No urinary symptoms. Denies any easy bleeding or bruising. Reports good appetite and denies weight loss. Denies chest pain. Denies any nausea, vomiting, constipation, or diarrhea. Denies urinary complaints. Patient offers no further specific complaints today.  PAST MEDICAL HISTORY: Past Medical History:  Diagnosis Date   Acute metabolic encephalopathy 9/89/2119   Confusion 12/30/2018   Polycythemia    Squamous cell carcinoma of skin 09/06/2020   R thumb - ED&C   Urinary tract infection without hematuria 01/02/2020   Urticaria 06/02/2017    PAST SURGICAL HISTORY:  Past Surgical History:  Procedure Laterality Date   ABDOMINAL HYSTERECTOMY     complete   BREAST SURGERY      HEMATOLOGY/ONCOLOGY HISTORY:  Oncology History Overview Note  Laura Wilcox is a 87 y.o. female with polycythemia rubra vera and secondary myelofibrosis.  She presented in 04/2002 with thrombocytosis (835,000).  JAK2 V617F was positive on 05/20/2012.   Over the past year, hematocrit has ranged 37.5 - 42.4, hemoglobin 12.1 - 13.4, platelets 458,000 - 569,000, WBC 10,500 - 15,700.  Creatinine has been 1.0 - 1.2.   Bone marrow  on 04/15/2002 revealed a normocellular marrow for age with trilineage hematopoiesis and mild megakaryocytic hyperplasia with focal atypia.  There was no morphologic evidence of a myeloproliferative disorder.  Iron stains were inadequate for evaluation of storage iron.  There was no increase in reticulin fibers.  She has been on agrylin since 2012.   Bone marrow on 09/25/2012 revealed a persistent myeloproliferative disorder s/p Agrylin.  The current features were best classified as persistent myeloproliferative neoplasm (MPN) JAK2 V617F mutation positive.  Marrow was  hypercellular for age (30-50%) with increased trilineage hematopoiesis and markedly increased atypical megakaryopoiesis with frequent clustering.  There were no increased blasts.  Iron stores were absent.  There was  moderate to severe myelofibrosis (grade MF 2-3).  Differential included ET, PV, primary myelofibrosis, and MPN, unclassifiable.  Cytogenetics were normal (23, XX).   She developed pancytopenia in 12/2015.  Etiology is unclear (medication confusion or hydroxyurea + Agrylin).   She received Procrit 40,000 units (intermittently 09/2012 - 06/2016), Venofer (09/2012 - 10/2012), and Infed (12/2015 - 03/2016).  She received Neupogen during a period of neutropenia. She was anagrelide from 2004 - 08/13/2017.  She began Laura Wilcox on 08/14/2017.  Jakafi was increased from 5 mg BID to 10 mg BID on 06/17/2018.  Jakafi was increased to 15 mg BID on 08/03/2018.   She began a phlebotomy program on 08/06/2017.  She undergoes small volume (150 cc with replacement of 150 cc NS) if her HCT > 42.   Ferritin has been followed: 529 on 09/03/2017, 1020 on 12/03/2017, 423 on 02/18/2018, and 858 on 11/17/2018.  Iron saturation was 37% and TIBC 270 on 11/17/2018.  B12 was 477 and folate 36 on 11/17/2018.   She was admitted to [facility] from 12/17/2018 - 12/18/2018.  She received 1 unit of PRBCs.  CBC at discharge included a hematocrit of 29.0, hemoglobin 9.4, platelets 364,000, and WBC 10,200.    Polycythemia rubra vera ([place])  06/02/2017 Initial Diagnosis   Polycythemia rubra vera ([facility])     ALLERGIES:  is allergic to bactrim [sulfamethoxazole-trimethoprim].  MEDICATIONS:  Current Outpatient Medications  Medication Sig Dispense Refill   acetaminophen (TYLENOL) 325 MG tablet Take 325-650 mg by mouth every 6 (six) hours as needed for mild pain or moderate pain.     acidophilus (RISAQUAD) CAPS capsule Take 1 capsule by mouth daily.     calcitRIOL (ROCALTROL) 0.25 MCG capsule Take 0.25 mcg by mouth every Monday, Wednesday, and Friday.     feeding supplement (ENSURE ENLIVE / ENSURE PLUS) LIQD Take 237 mLs by mouth 2 (two) times daily between meals. 237 mL 12   mupirocin ointment (BACTROBAN) 2 % Apply to any open sores daily. 22 g 0    ruxolitinib phosphate (JAKAFI) 15 MG tablet Take 1 tablet (15 mg total) by mouth 2 (two) times daily. 60 tablet 1   No current facility-administered medications for this visit.    VITAL SIGNS: There were no vitals taken for this visit. There were no vitals filed for this visit.  Estimated body mass index is 17.61 kg/m as calculated from the following:   Height as of 10/31/20: 5\' 7"  (1.702 m).   Weight as of 10/23/20: 112 lb 7 oz (51 kg).  LABS: CBC:    Component Value Date/Time   WBC 7.1 11/06/2020 1415   HGB 8.3 (L) 11/06/2020 1415   HGB 9.5 (L) 01/05/2019 1041   HCT 27.3 (L) 11/06/2020 1415   PLT 471 (H) 11/06/2020 1415   MCV 94.5 11/06/2020 1415   NEUTROABS 4.6 11/06/2020 1415   LYMPHSABS 1.4 11/06/2020 1415   MONOABS 0.5 11/06/2020 1415   EOSABS 0.3 11/06/2020 1415   BASOSABS 0.1 11/06/2020 1415   Comprehensive Metabolic Panel:    Component Value Date/Time   NA 139 10/25/2020 0822   K 4.1 10/25/2020 0822   CL 108 10/25/2020 0822   CO2 22 10/25/2020 0822   BUN 24 (H) 10/25/2020 0822   CREATININE 1.45 (H) 10/25/2020 0822   GLUCOSE 96 10/25/2020 0822   CALCIUM  8.5 (L) 10/25/2020 0822   AST 19 10/22/2020 1657   ALT 17 10/22/2020 1657   ALKPHOS 68 10/22/2020 1657   BILITOT 0.8 10/22/2020 1657   PROT 5.9 (L) 10/22/2020 1657   ALBUMIN 2.7 (L) 10/22/2020 1657    RADIOGRAPHIC STUDIES: DG Chest Port 1 View  Result Date: 10/22/2020 CLINICAL DATA:  High fever and dyspnea. EXAM: PORTABLE CHEST 1 VIEW COMPARISON:  10/22/2020 FINDINGS: Shallow inspiration and patient rotation limits examination. Heart size and pulmonary vascularity are normal for technique. Lungs are clear. No pleural effusions. No pneumothorax. Esophageal hiatal hernia behind the heart. Tortuous aorta. IMPRESSION: No active disease. Electronically Signed   By: Lucienne Capers M.D.   On: 10/22/2020 22:42   DG Chest Port 1 View  Result Date: 10/22/2020 CLINICAL DATA:  Lethargy. Initial hypotension.  Possible UTI or sepsis. Anemia. EXAM: PORTABLE CHEST 1 VIEW COMPARISON:  12/17/2018 FINDINGS: The patient is rotated to the right on today's radiograph, reducing diagnostic sensitivity and specificity. Atherosclerotic calcification of the aortic arch. The lungs appear clear. Heart size within normal limits for projection and degree of rotation. No acute bony findings. No blunting of the costophrenic angles. IMPRESSION: 1. No findings of pneumonia. 2.  Aortic Atherosclerosis (ICD10-I70.0). Electronically Signed   By: Van Clines M.D.   On: 10/22/2020 17:32    PERFORMANCE STATUS (ECOG) : 2 - Symptomatic, <50% confined to bed  Review of Systems Unless otherwise noted, a complete review of systems is negative.  Physical Exam General: NAD Cardiovascular: regular rate and rhythm Pulmonary: clear ant fields Abdomen: soft, nontender, + bowel sounds GU: no suprapubic tenderness Extremities: no edema, no joint deformities Skin: no rashes Neurological: Weakness, some confusion but otherwise nonfocal  Assessment and Plan- Patient is a 87 y.o. female with polycythemia vera previously treated on Jakafi, anemia, CKD stage IIIb, and cognitive impairment who presents to [facility] today for evaluation of weakness  Weakness -this does not sound markedly different from when patient was hospitalized 2 weeks ago.  Likely weakness is multifactorial in setting of advanced age, questionable dementia, and recent hospitalization from sepsis.  This may reflect a new baseline.  Home health is pending.  Will refer to community palliative care.  CBC and CMP are relatively unchanged from baseline. Will add TSH, iron panel, retic panel, B12 and folate.  Recheck UA/culture today.  Polycythemia vera -discussed with Dr. Tasia Catchings and will continue holding the [facility].  Patient to see Dr. Tasia Catchings next week.  Cognitive impairment -this appears chronic per daughter but likely exacerbated by recent hospitalization/sepsis.  I question if patient  has underlying mild dementia. Per PCP note and daughter, patient has been fairly resistant to seeing a neurologist despite multiple referrals in the past.  Daughter asked if patient could see someone here at the [facility]. Discussed with Dr. Tasia Catchings and will see if Dr. Mickeal Skinner can see her.    Case and plan discussed with Dr. Tasia Catchings.  Patient to RTC next week   Patient expressed understanding and was in agreement with this plan. She also understands that She can call clinic at any time with any questions, concerns, or complaints.   Thank you for allowing me to participate in the care of this very pleasant patient.   Time Total: 25 minutes  Visit consisted of counseling and education dealing with the complex and emotionally intense issues of symptom management and palliative care in the setting of serious and potentially life-threatening illness.Greater than 50%  of this time was spent counseling and  coordinating care related to the above assessment and plan.  Signed by: Altha Harm, PhD, NP-C

## 2020-11-06 NOTE — Telephone Encounter (Signed)
Daughter [name] called requesting that patient appointment on 8/12 be moved up as patient is not doing well. She was hospitalized for UTI and Sepsis and has been off of her Jakafi for 2 weeks. She is weak, difficult to arouse in the mornings and is angry and cursing when she does get aroused. She only really awakens around 6 PM and complains of being tired of feeling so weak all the time. Daughter states the last time patient was like this, her hgb was low and she required a blood transfusion. Her HGB has been on a steady decline for the past month She feels the patient needs evaluation and requests an afternoon appointment for this.  She also had swelling of he lower extremities and saw her nephrologist who drew chemistry on her, but no cbc. Please advise

## 2020-11-07 ENCOUNTER — Other Ambulatory Visit: Payer: Self-pay

## 2020-11-07 ENCOUNTER — Telehealth: Payer: Self-pay | Admitting: Primary Care

## 2020-11-07 ENCOUNTER — Other Ambulatory Visit (HOSPITAL_COMMUNITY): Payer: Self-pay

## 2020-11-07 ENCOUNTER — Telehealth: Payer: Self-pay

## 2020-11-07 DIAGNOSIS — N390 Urinary tract infection, site not specified: Secondary | ICD-10-CM

## 2020-11-07 NOTE — Telephone Encounter (Signed)
Spoke with patient's daughter, Arrie Aran, regarding the Palliative referral/services and all questions were answered and she was in agreement with scheduling visit.  I have scheduled an In-home Consult for 11/09/20 @ 12:30 PM

## 2020-11-07 NOTE — Telephone Encounter (Signed)
Called pt's daughter [name], and explained to her, that due to contamination, we would like to obtain another urine sample. Explained to [name] that we could try collecting this in the clinic, or she could try at home. She would rather try and collect sample at home first. Explained that proper hygiene was essential at obtaining accurate results. Explained that her mother may need hands on assistance if she cannot do this. Suggested that she may try giving sample after she takes a shower. [name] verbalized understanding, and stated that she has sterile specimen cup at home and will try to obtain sample this evening. She will call us if she is unable to obtain the sample.

## 2020-11-08 ENCOUNTER — Other Ambulatory Visit: Payer: Self-pay

## 2020-11-08 DIAGNOSIS — D75839 Thrombocytosis, unspecified: Secondary | ICD-10-CM | POA: Diagnosis not present

## 2020-11-08 DIAGNOSIS — D631 Anemia in chronic kidney disease: Secondary | ICD-10-CM | POA: Diagnosis not present

## 2020-11-08 DIAGNOSIS — D7581 Myelofibrosis: Secondary | ICD-10-CM | POA: Diagnosis not present

## 2020-11-08 DIAGNOSIS — R4189 Other symptoms and signs involving cognitive functions and awareness: Secondary | ICD-10-CM | POA: Diagnosis not present

## 2020-11-08 DIAGNOSIS — N390 Urinary tract infection, site not specified: Secondary | ICD-10-CM

## 2020-11-08 DIAGNOSIS — D45 Polycythemia vera: Secondary | ICD-10-CM | POA: Diagnosis not present

## 2020-11-08 DIAGNOSIS — N1832 Chronic kidney disease, stage 3b: Secondary | ICD-10-CM | POA: Diagnosis not present

## 2020-11-08 LAB — URINE CULTURE: Culture: >=100000 — AB

## 2020-11-08 LAB — URINALYSIS, COMPLETE (UACMP) WITH MICROSCOPIC
Bilirubin Urine: NEGATIVE
Glucose, UA: NEGATIVE mg/dL
Hgb urine dipstick: NEGATIVE
Ketones, ur: NEGATIVE mg/dL
Leukocytes,Ua: NEGATIVE
Nitrite: NEGATIVE
Protein, ur: NEGATIVE mg/dL
Specific Gravity, Urine: 1.012 (ref 1.005–1.030)
pH: 5 (ref 5.0–8.0)

## 2020-11-09 ENCOUNTER — Other Ambulatory Visit: Payer: Medicare Other | Admitting: Primary Care

## 2020-11-09 ENCOUNTER — Other Ambulatory Visit (HOSPITAL_COMMUNITY): Payer: Self-pay

## 2020-11-09 ENCOUNTER — Other Ambulatory Visit: Payer: Self-pay

## 2020-11-09 ENCOUNTER — Telehealth: Payer: Self-pay | Admitting: Primary Care

## 2020-11-09 NOTE — Telephone Encounter (Signed)
Spoke with patient's daughter Laura Wilcox, to let her know that the Palliative NP had an emergency visit this morning and that she was running behind and daughter requested to reschedule the Palliative Consult.  This was rescheduled for 11/21/20 @ 2 PM.  Notified Palliative NP.

## 2020-11-10 ENCOUNTER — Other Ambulatory Visit: Payer: Medicare Other

## 2020-11-10 ENCOUNTER — Ambulatory Visit: Payer: Medicare Other

## 2020-11-10 LAB — URINE CULTURE: Culture: <10000 — AB

## 2020-11-13 ENCOUNTER — Ambulatory Visit: Payer: Medicare Other | Admitting: Oncology

## 2020-11-16 ENCOUNTER — Inpatient Hospital Stay: Payer: Medicare Other | Admitting: Hospice and Palliative Medicine

## 2020-11-16 ENCOUNTER — Inpatient Hospital Stay (HOSPITAL_BASED_OUTPATIENT_CLINIC_OR_DEPARTMENT_OTHER): Payer: Medicare Other | Admitting: Oncology

## 2020-11-16 ENCOUNTER — Other Ambulatory Visit: Payer: Self-pay

## 2020-11-16 ENCOUNTER — Inpatient Hospital Stay: Payer: Medicare Other

## 2020-11-16 ENCOUNTER — Encounter: Payer: Self-pay | Admitting: Oncology

## 2020-11-16 VITALS — BP 115/61 | HR 88 | Temp 98.4°F | Resp 14 | Wt 111.4 lb

## 2020-11-16 DIAGNOSIS — D631 Anemia in chronic kidney disease: Secondary | ICD-10-CM

## 2020-11-16 DIAGNOSIS — D75839 Thrombocytosis, unspecified: Secondary | ICD-10-CM | POA: Diagnosis not present

## 2020-11-16 DIAGNOSIS — R4189 Other symptoms and signs involving cognitive functions and awareness: Secondary | ICD-10-CM | POA: Diagnosis not present

## 2020-11-16 DIAGNOSIS — N1832 Chronic kidney disease, stage 3b: Secondary | ICD-10-CM

## 2020-11-16 DIAGNOSIS — R531 Weakness: Secondary | ICD-10-CM | POA: Diagnosis not present

## 2020-11-16 DIAGNOSIS — D45 Polycythemia vera: Secondary | ICD-10-CM

## 2020-11-16 DIAGNOSIS — R634 Abnormal weight loss: Secondary | ICD-10-CM

## 2020-11-16 DIAGNOSIS — D7581 Myelofibrosis: Secondary | ICD-10-CM

## 2020-11-16 DIAGNOSIS — N390 Urinary tract infection, site not specified: Secondary | ICD-10-CM

## 2020-11-16 DIAGNOSIS — D539 Nutritional anemia, unspecified: Secondary | ICD-10-CM

## 2020-11-16 LAB — COMPREHENSIVE METABOLIC PANEL
ALT: 12 U/L (ref 0–44)
AST: 16 U/L (ref 15–41)
Albumin: 3.7 g/dL (ref 3.5–5.0)
Alkaline Phosphatase: 87 U/L (ref 38–126)
Anion gap: 9 (ref 5–15)
BUN: 26 mg/dL — ABNORMAL HIGH (ref 8–23)
CO2: 25 mmol/L (ref 22–32)
Calcium: 9.1 mg/dL (ref 8.9–10.3)
Chloride: 101 mmol/L (ref 98–111)
Creatinine, Ser: 1.12 mg/dL — ABNORMAL HIGH (ref 0.44–1.00)
GFR, Estimated: 46 mL/min — ABNORMAL LOW (ref >60)
Glucose, Bld: 106 mg/dL — ABNORMAL HIGH (ref 70–99)
Potassium: 3.7 mmol/L (ref 3.5–5.1)
Sodium: 135 mmol/L (ref 135–145)
Total Bilirubin: 0.5 mg/dL (ref 0.3–1.2)
Total Protein: 7.1 g/dL (ref 6.5–8.1)

## 2020-11-16 LAB — CBC WITH DIFFERENTIAL/PLATELET
Abs Immature Granulocytes: 0.51 10*3/uL — ABNORMAL HIGH (ref 0.00–0.07)
Basophils Absolute: 0.1 10*3/uL (ref 0.0–0.1)
Basophils Relative: 1 %
Eosinophils Absolute: 0.5 10*3/uL (ref 0.0–0.5)
Eosinophils Relative: 5 %
HCT: 28.6 % — ABNORMAL LOW (ref 36.0–46.0)
Hemoglobin: 8.7 g/dL — ABNORMAL LOW (ref 12.0–15.0)
Immature Granulocytes: 5 %
Lymphocytes Relative: 15 %
Lymphs Abs: 1.6 10*3/uL (ref 0.7–4.0)
MCH: 28.1 pg (ref 26.0–34.0)
MCHC: 30.4 g/dL (ref 30.0–36.0)
MCV: 92.3 fL (ref 80.0–100.0)
Monocytes Absolute: 0.8 10*3/uL (ref 0.1–1.0)
Monocytes Relative: 8 %
Neutro Abs: 6.9 10*3/uL (ref 1.7–7.7)
Neutrophils Relative %: 66 %
Platelets: 731 10*3/uL — ABNORMAL HIGH (ref 150–400)
RBC: 3.1 MIL/uL — ABNORMAL LOW (ref 3.87–5.11)
RDW: 25.3 % — ABNORMAL HIGH (ref 11.5–15.5)
WBC: 10.5 10*3/uL (ref 4.0–10.5)
nRBC: 0.5 % — ABNORMAL HIGH (ref 0.0–0.2)

## 2020-11-16 LAB — VITAMIN B12: Vitamin B-12: 302 pg/mL (ref 180–914)

## 2020-11-16 LAB — IRON AND TIBC
Iron: 83 ug/dL (ref 28–170)
Saturation Ratios: 31 % (ref 10.4–31.8)
TIBC: 267 ug/dL (ref 250–450)
UIBC: 184 ug/dL

## 2020-11-16 LAB — RETIC PANEL
Immature Retic Fract: 32.9 % — ABNORMAL HIGH (ref 2.3–15.9)
RBC.: 3.09 MIL/uL — ABNORMAL LOW (ref 3.87–5.11)
Retic Count, Absolute: 180.1 10*3/uL (ref 19.0–186.0)
Retic Ct Pct: 5.8 % — ABNORMAL HIGH (ref 0.4–3.1)
Reticulocyte Hemoglobin: 28.9 pg (ref >27.9)

## 2020-11-16 LAB — FERRITIN: Ferritin: 515 ng/mL — ABNORMAL HIGH (ref 11–307)

## 2020-11-16 LAB — TSH: TSH: 3.297 u[IU]/mL (ref 0.350–4.500)

## 2020-11-16 MED ORDER — EPOETIN ALFA-EPBX 10000 UNIT/ML IJ SOLN
10000.0000 [IU] | Freq: Once | INTRAMUSCULAR | Status: AC
  Administered 2020-11-16: 10000 [IU] via SUBCUTANEOUS

## 2020-11-16 NOTE — Progress Notes (Signed)
Pts daughter states pt is sleeping 21 hours of the day, will only get up for meals. On a good day, pt will stay up an extra hour during the day. Not eating well but according to pt "she eats great.". Daughter will like to know if she is doing enough for pts care.

## 2020-11-17 LAB — FOLATE RBC
Folate, Hemolysate: 374 ng/mL
Folate, RBC: 1303 ng/mL (ref >498)
Hematocrit: 28.7 % — ABNORMAL LOW (ref 34.0–46.6)

## 2020-11-17 MED ORDER — RUXOLITINIB PHOSPHATE 10 MG PO TABS: 10.0000 mg | ORAL_TABLET | Freq: Two times a day (BID) | ORAL | 0 refills | Status: DC

## 2020-11-17 NOTE — Progress Notes (Signed)
[facility]  [address], [address] [place], [place] [address] Phone: [phone]  Fax: [phone]   Clinic Day:  11/17/2020   Referring physician: Glean Hess, MD  Chief Complaint: Laura Wilcox is a 87 y.o. female with polycythemia vera (PV) presents for follow up   Laura Wilcox is a 87 y.o.afemale who has above oncology history reviewed by me today presented for follow up visit for management of  polycythemia vera  Patient previously followed up by Dr.Corcoran, patient switched care to me on 11/16/20 Extensive medical record review was performed by me  Feb/2014 polycythemia rubra vera and secondary myelofibrosis.  She presented in 04/2002 with thrombocytosis (835,000).  JAK2 V617F was positive    She developed pancytopenia in 12/2015.  Etiology is unclear (medication confusion or hydroxyurea + Agrylin).   She received Procrit 40,000 units (intermittently 09/2012 - 06/2016), Venofer (09/2012 - 10/2012), and Infed (12/2015 - 03/2016).  She received Neupogen during a period of neutropenia. She was anagrelide from 2004 - 08/13/2017.  She began [place] on 08/14/2017.  Jakafi was increased from 5 mg BID to 10 mg BID on 06/17/2018.  Jakafi was increased to 15 mg BID on 08/03/2018.  She has been back on Jakafi 5 mg BID since 02/01/2019 then 10 mg BID on 05/25/2019.  [name] was on hold from 12/20/2019 - 01/12/2020.  Jakafi 5 mg twice daily restarted on 01/12/2020 then increased to 10 mg in the morning and 5 mg in the evening on 02/02/2020. [name] is currently 20 mg po BID.  She has received Retacrit to maintain a hemoglobin > 10 (12/24/2019 and 06/26/2020). Last seen by Dr,.Corcoran on 07/10/2020  09/12/2020 seen by Francella Solian, was recommended to increase Jakafin to 27m BID.   Squamous cell carcinoma left cheek- s/p cryotherapy and debridement. Managed by Dr. KNehemiah Massed INTERVAL HISTORY Laura Beauchaineis a 87y.o. female  who has above history reviewed by me today presents for follow up visit for management of polycythemia rubra vera and secondary myelofibrosis.    10/22/2020- 10/26/2020 Hospitalization due to acute metabolic encephalopathy, UTI,E. coli bacteremia AKI on CKD, acute on chronic anemia. JShanon Browwas held during the admission. She was discharged and spent a short period of time in rehab.   11/06/2020 seen by symptom management for weakness.   Patient was accompanied by daughter today to clinic to establish care.  Patient lives at home with daughter. She is not able to provide medical history.  Per daughter, patient has been sleeping 20 hours per day recently. She needs assistance with ADLs. Appetite has improved, but not too good. No feverl chills, urianry symptom.  Patient has been forgetful. Her function status has declined.    Past Medical History:  Diagnosis Date   Acute metabolic encephalopathy 74/73/4037  Confusion 12/30/2018   Polycythemia    Squamous cell carcinoma of skin 09/06/2020   R thumb - ED&C   Urinary tract infection without hematuria 01/02/2020   Urticaria 06/02/2017    Past Surgical History:  Procedure Laterality Date   ABDOMINAL HYSTERECTOMY     complete   BREAST SURGERY      Family History  Problem Relation Age of Onset   Heart disease Father    Cancer Sister        breast   Cancer Sister        breast   Alzheimer's disease Sister     Social History:  reports that she has never smoked. She has  never used smokeless tobacco. She reports that she does not drink alcohol and does not use drugs. She is widowed.  Patient is retired Sport and exercise psychologist. 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  She lives independently in her own apartment in [place]. Patient denies known exposures to radiation on toxins.  Pt's daughter, Laura Wilcox, can be reached at [phone].  She lives in [place]. The patient is accompanied by her daughter today.   Allergies:  Allergies  Allergen Reactions   Bactrim  [Sulfamethoxazole-Trimethoprim]     Due to kidney disease     Current Medications: Current Outpatient Medications  Medication Sig Dispense Refill   feeding supplement (ENSURE ENLIVE / ENSURE PLUS) LIQD Take 237 mLs by mouth 2 (two) times daily between meals. 237 mL 12   acetaminophen (TYLENOL) 325 MG tablet Take 325-650 mg by mouth every 6 (six) hours as needed for mild pain or moderate pain. (Patient not taking: No sig reported)     acidophilus (RISAQUAD) CAPS capsule Take 1 capsule by mouth daily. (Patient not taking: No sig reported)     calcitRIOL (ROCALTROL) 0.25 MCG capsule Take 0.25 mcg by mouth every Monday, Wednesday, and Friday. (Patient not taking: No sig reported)     mupirocin ointment (BACTROBAN) 2 % Apply to any open sores daily. (Patient not taking: No sig reported) 22 g 0   ruxolitinib phosphate (JAKAFI) 15 MG tablet Take 1 tablet (15 mg total) by mouth 2 (two) times daily. (Patient not taking: No sig reported) 60 tablet 1   No current facility-administered medications for this visit.    Review of Systems  Unable to perform ROS: Dementia  Psychiatric/Behavioral:         Sleeps 20 hours per day.    Performance status (ECOG):  2  Vitals Blood pressure 115/61, pulse 88, temperature 98.4 F (36.9 C), resp. rate 14, weight 111 lb 7.1 oz (50.5 kg), SpO2 100 %.   Physical Exam Vitals reviewed.  Constitutional:      General: She is not in acute distress.    Appearance: She is well-developed. She is not ill-appearing.  HENT:     Head: Normocephalic and atraumatic.     Nose: Nose normal. No congestion.     Mouth/Throat:     Pharynx: No oropharyngeal exudate.  Eyes:     General: No scleral icterus.    Conjunctiva/sclera: Conjunctivae normal.  Cardiovascular:     Rate and Rhythm: Normal rate and regular rhythm.     Pulses: Normal pulses.     Heart sounds: No murmur heard. Pulmonary:     Effort: Pulmonary effort is normal. No respiratory distress.  Abdominal:      General: There is no distension.     Tenderness: There is no abdominal tenderness. There is no guarding.  Musculoskeletal:        General: No deformity. Normal range of motion.     Right lower leg: No edema.     Left lower leg: No edema.     Comments: Ambulates without aids  Skin:    General: Skin is warm and dry.     Coloration: Skin is not pale.  Neurological:     Mental Status: She is alert. Mental status is at baseline.     Motor: No weakness.  Psychiatric:        Attention and Perception: Attention normal.        Mood and Affect: Mood and affect normal.        Behavior: Behavior normal. Behavior is  cooperative.        Cognition and Memory: Cognition is impaired. Memory is impaired.    Orders Only on 11/16/2020  Component Date Value Ref Range Status   Iron 11/16/2020 83  28 - 170 ug/dL Final   TIBC 11/16/2020 267  250 - 450 ug/dL Final   Saturation Ratios 11/16/2020 31  10.4 - 31.8 % Final   UIBC 11/16/2020 184  ug/dL Final   Performed at [facility], [address] [place], [place] [address]   Ferritin 11/16/2020 515 (A) 11 - 307 ng/mL Final   Performed at [facility], [address] [place], [place] [address]  Clinical Support on 11/16/2020  Component Date Value Ref Range Status   Sodium 11/16/2020 135  135 - 145 mmol/L Final   Potassium 11/16/2020 3.7  3.5 - 5.1 mmol/L Final   Chloride 11/16/2020 101  98 - 111 mmol/L Final   CO2 11/16/2020 25  22 - 32 mmol/L Final   Glucose, Bld 11/16/2020 106 (A) 70 - 99 mg/dL Final   Glucose reference range applies only to samples taken after fasting for at least 8 hours.   BUN 11/16/2020 26 (A) 8 - 23 mg/dL Final   Creatinine, Ser 11/16/2020 1.12 (A) 0.44 - 1.00 mg/dL Final   Calcium 11/16/2020 9.1  8.9 - 10.3 mg/dL Final   Total Protein 11/16/2020 7.1  6.5 - 8.1 g/dL Final   Albumin 11/16/2020 3.7  3.5 - 5.0 g/dL Final   AST 11/16/2020 16  15 - 41 U/L Final   ALT 11/16/2020 12  0 - 44 U/L Final   Alkaline  Phosphatase 11/16/2020 87  38 - 126 U/L Final   Total Bilirubin 11/16/2020 0.5  0.3 - 1.2 mg/dL Final   GFR, Estimated 11/16/2020 46 (A) >60 mL/min Final   Comment: (NOTE) Calculated using the CKD-EPI Creatinine Equation (2021)    Anion gap 11/16/2020 9  5 - 15 Final   Performed at [facility], [address], [place], [place] [address]   WBC 11/16/2020 10.5  4.0 - 10.5 K/uL Final   RBC 11/16/2020 3.10 (A) 3.87 - 5.11 MIL/uL Final   Hemoglobin 11/16/2020 8.7 (A) 12.0 - 15.0 g/dL Final   HCT 11/16/2020 28.6 (A) 36.0 - 46.0 % Final   MCV 11/16/2020 92.3  80.0 - 100.0 fL Final   MCH 11/16/2020 28.1  26.0 - 34.0 pg Final   MCHC 11/16/2020 30.4  30.0 - 36.0 g/dL Final   RDW 11/16/2020 25.3 (A) 11.5 - 15.5 % Final   Platelets 11/16/2020 731 (A) 150 - 400 K/uL Final   nRBC 11/16/2020 0.5 (A) 0.0 - 0.2 % Final   Neutrophils Relative % 11/16/2020 66  % Final   Neutro Abs 11/16/2020 6.9  1.7 - 7.7 K/uL Final   Lymphocytes Relative 11/16/2020 15  % Final   Lymphs Abs 11/16/2020 1.6  0.7 - 4.0 K/uL Final   Monocytes Relative 11/16/2020 8  % Final   Monocytes Absolute 11/16/2020 0.8  0.1 - 1.0 K/uL Final   Eosinophils Relative 11/16/2020 5  % Final   Eosinophils Absolute 11/16/2020 0.5  0.0 - 0.5 K/uL Final   Basophils Relative 11/16/2020 1  % Final   Basophils Absolute 11/16/2020 0.1  0.0 - 0.1 K/uL Final   Immature Granulocytes 11/16/2020 5  % Final   Abs Immature Granulocytes 11/16/2020 0.51 (A) 0.00 - 0.07 K/uL Final   Performed at [facility], [address], [place],  [place] [address]   Retic Ct Pct 11/16/2020 5.8 (A) 0.4 - 3.1 % Final   RBC. 11/16/2020 3.09 (A) 3.87 - 5.11 MIL/uL Final   Retic Count, Absolute 11/16/2020 180.1  19.0 - 186.0 K/uL Final   Immature Retic Fract 11/16/2020 32.9 (A) 2.3 - 15.9 % Final   Reticulocyte Hemoglobin 11/16/2020 28.9  >27.9 pg Final   Comment:           If this patient has chronic kidney disease and does not have  a hemoglobinopathy a RET-He < 29 pg meets criteria for iron deficiency per the 2016 NICE guidelines.    Refer to specific guidelines to determine the appropriate thresholds for treating CKD- associated iron deficiency. TSAT and ferritin should be used in patients with hemoglobinopathies (e.g. thalassemia). Performed at [facility], [address] [place], [place] [address]    Folate, Hemolysate 11/16/2020 374.0  Not Estab. ng/mL Final   Hematocrit 11/16/2020 28.7 (A) 34.0 - 46.6 % Final   Folate, RBC 11/16/2020 1,303  >498 ng/mL Final   Comment: (NOTE) Performed At: [facility] [place], [place] [id number] Rush Farmer MD MG:[id number]    Vitamin B-12 11/16/2020 302  180 - 914 pg/mL Final   Comment: (NOTE) This assay is not validated for testing neonatal or myeloproliferative syndrome specimens for Vitamin B12 levels. Performed at [facility], [place] [address] [place], [place] [address]    TSH 11/16/2020 3.297  0.350 - 4.500 uIU/mL Final   Comment: Performed by a 3rd Generation assay with a functional sensitivity of <=0.01 uIU/mL. Performed at [facility], [place], [place] [address]    [facility]     Component Value Date/Time   NA 135 11/16/2020 1401   K 3.7 11/16/2020 1401   CL 101 11/16/2020 1401   CO2 25 11/16/2020 1401   GLUCOSE 106 (H) 11/16/2020 1401   BUN 26 (H) 11/16/2020 1401   CREATININE 1.12 (H) 11/16/2020 1401   CALCIUM 9.1 11/16/2020 1401   PROT 7.1 11/16/2020 1401   ALBUMIN 3.7 11/16/2020 1401   AST 16 11/16/2020 1401   ALT 12 11/16/2020 1401   ALKPHOS 87 11/16/2020 1401   BILITOT 0.5 11/16/2020 1401   GFRNONAA 46 (L) 11/16/2020 1401   GFRAA 49 (L) 12/27/2019 0935    Assessment and Plan.  1. Polycythemia rubra vera ([place])   2. Anemia in stage 3b chronic kidney disease ([place])   3. Myelofibrosis ([place])   4. Weakness    5. Weight loss    # Polycythemia vera and secondary myelofibrosis.   Off Jakafi since admission.  Labs are reviewed and discussed with patient. Thrombocytosis, platelet has progressively increased since the stop of Jakafi.  Recommend to resume with dose reduction, Jakafi 55m BID.    # Anemia due to CKD, and myelofibrosis.  She has been on low dose retacrit 10000 units monthly.  Proceed with retacrit today.  Iron panel 8/18 showed ferritin of 515, iron saturation 31.    #Dementia/cognitive impairment- patient has hx of dementia. Per daughter, it has been difficult to convince patent to see neurology for further evaluation. She is not on any medication for her memory loss.  She has been referred to home health.   # Weakenss likely multifactorial. This maybe a new baseline for her  Normal TSH, B12,   # Weight loss, malnutrition, probabaly due to decreased oral intake and decreased awake time.   Follow up in 2 weeks.  We spent  sufficient time to discuss many aspect of care, questions were answered to patient's satisfaction. A total of 40 minutes was spent on this visit.  With 15 minutes spent reviewing image findings, pathology reports, 20 minutes counseling the patient on the diagnosis, goal of care,  side effects of the treatment, management of symptoms.  Additional 5 minutes was spent on answering patient's questions.   Earlie Server, MD, PhD Hematology Oncology [place] at [facility] 11/17/2020

## 2020-11-21 ENCOUNTER — Telehealth: Payer: Self-pay | Admitting: Primary Care

## 2020-11-21 ENCOUNTER — Other Ambulatory Visit: Payer: Self-pay

## 2020-11-21 ENCOUNTER — Other Ambulatory Visit: Payer: Medicare Other | Admitting: Primary Care

## 2020-11-21 NOTE — Telephone Encounter (Signed)
Rec'd call from patient's daughter, Arrie Aran, stating that she needed to cancel the Palliative Consult visit scheduled for today at 2 PM, I offered to reschedule the appointment and she declined.  Daughter stated that she would call us back to reschedule whenever things settle down.  Notified Palliative NP.

## 2020-11-27 ENCOUNTER — Other Ambulatory Visit (HOSPITAL_COMMUNITY): Payer: Self-pay

## 2020-11-27 ENCOUNTER — Encounter: Payer: Self-pay | Admitting: Hematology and Oncology

## 2020-11-27 ENCOUNTER — Telehealth: Payer: Self-pay | Admitting: *Deleted

## 2020-11-27 ENCOUNTER — Other Ambulatory Visit: Payer: Self-pay | Admitting: Pharmacist

## 2020-11-27 DIAGNOSIS — D7581 Myelofibrosis: Secondary | ICD-10-CM

## 2020-11-27 MED ORDER — RUXOLITINIB PHOSPHATE 10 MG PO TABS
10.0000 mg | ORAL_TABLET | Freq: Two times a day (BID) | ORAL | 0 refills | Status: DC
  Filled 2020-11-27: qty 60, 30d supply, fill #0

## 2020-11-27 NOTE — Telephone Encounter (Signed)
Prescription for Jakafi 10mg  sent to [facility]. Bethena Roys will be reaching out to the daughter to set-up medication delivery for tomorrow 11/28/20.

## 2020-11-27 NOTE — Telephone Encounter (Signed)
Daughter called reporting that at last appointment patient Jakafi dose was reduced to 10 mg. She states that they have been using an old prescription and that no new prescription has been received as of this time and  patient only has 1 more dose of medicine left. The prescription entered into her chart was as a No Print so it was not sent anywhere. Please advise

## 2020-11-30 ENCOUNTER — Inpatient Hospital Stay: Payer: Medicare Other

## 2020-11-30 ENCOUNTER — Encounter: Payer: Self-pay | Admitting: Oncology

## 2020-11-30 ENCOUNTER — Other Ambulatory Visit: Payer: Self-pay

## 2020-11-30 ENCOUNTER — Inpatient Hospital Stay: Payer: Medicare Other | Attending: Oncology

## 2020-11-30 ENCOUNTER — Inpatient Hospital Stay (HOSPITAL_BASED_OUTPATIENT_CLINIC_OR_DEPARTMENT_OTHER): Payer: Medicare Other | Admitting: Oncology

## 2020-11-30 VITALS — BP 113/68 | HR 84 | Temp 97.5°F | Resp 18 | Wt 108.9 lb

## 2020-11-30 DIAGNOSIS — Z8249 Family history of ischemic heart disease and other diseases of the circulatory system: Secondary | ICD-10-CM | POA: Insufficient documentation

## 2020-11-30 DIAGNOSIS — D45 Polycythemia vera: Secondary | ICD-10-CM | POA: Insufficient documentation

## 2020-11-30 DIAGNOSIS — Z803 Family history of malignant neoplasm of breast: Secondary | ICD-10-CM | POA: Diagnosis not present

## 2020-11-30 DIAGNOSIS — D7581 Myelofibrosis: Secondary | ICD-10-CM | POA: Diagnosis not present

## 2020-11-30 DIAGNOSIS — G9341 Metabolic encephalopathy: Secondary | ICD-10-CM | POA: Insufficient documentation

## 2020-11-30 DIAGNOSIS — C44329 Squamous cell carcinoma of skin of other parts of face: Secondary | ICD-10-CM | POA: Diagnosis not present

## 2020-11-30 DIAGNOSIS — N1832 Chronic kidney disease, stage 3b: Secondary | ICD-10-CM | POA: Insufficient documentation

## 2020-11-30 DIAGNOSIS — D631 Anemia in chronic kidney disease: Secondary | ICD-10-CM | POA: Insufficient documentation

## 2020-11-30 DIAGNOSIS — Z79899 Other long term (current) drug therapy: Secondary | ICD-10-CM | POA: Diagnosis not present

## 2020-11-30 DIAGNOSIS — Z818 Family history of other mental and behavioral disorders: Secondary | ICD-10-CM | POA: Insufficient documentation

## 2020-11-30 DIAGNOSIS — R531 Weakness: Secondary | ICD-10-CM | POA: Insufficient documentation

## 2020-11-30 LAB — CBC WITH DIFFERENTIAL/PLATELET
Abs Immature Granulocytes: 0.28 10*3/uL — ABNORMAL HIGH (ref 0.00–0.07)
Basophils Absolute: 0.2 10*3/uL — ABNORMAL HIGH (ref 0.0–0.1)
Basophils Relative: 2 %
Eosinophils Absolute: 0.4 10*3/uL (ref 0.0–0.5)
Eosinophils Relative: 5 %
HCT: 29.8 % — ABNORMAL LOW (ref 36.0–46.0)
Hemoglobin: 9.2 g/dL — ABNORMAL LOW (ref 12.0–15.0)
Immature Granulocytes: 3 %
Lymphocytes Relative: 21 %
Lymphs Abs: 1.8 10*3/uL (ref 0.7–4.0)
MCH: 28.9 pg (ref 26.0–34.0)
MCHC: 30.9 g/dL (ref 30.0–36.0)
MCV: 93.7 fL (ref 80.0–100.0)
Monocytes Absolute: 0.7 10*3/uL (ref 0.1–1.0)
Monocytes Relative: 8 %
Neutro Abs: 5.5 10*3/uL (ref 1.7–7.7)
Neutrophils Relative %: 61 %
Platelets: 593 10*3/uL — ABNORMAL HIGH (ref 150–400)
RBC: 3.18 MIL/uL — ABNORMAL LOW (ref 3.87–5.11)
RDW: 24.4 % — ABNORMAL HIGH (ref 11.5–15.5)
WBC: 8.9 10*3/uL (ref 4.0–10.5)
nRBC: 0 % (ref 0.0–0.2)

## 2020-11-30 MED ORDER — ASPIRIN EC 81 MG PO TBEC: 81.0000 mg | DELAYED_RELEASE_TABLET | Freq: Every day | ORAL | 0 refills | Status: DC

## 2020-11-30 NOTE — Progress Notes (Signed)
Laura Wilcox  [address] [place], [place] [address] Phone: [phone]  Fax: [phone]   Clinic Day:  11/30/2020   Referring physician: Glean Hess, MD  Chief Complaint: Laura Wilcox is a 87 y.o. female with polycythemia vera (PV) presents for follow up   Laura Wilcox is a 87 y.o.afemale who has above oncology history reviewed by me today presented for follow up visit for management of  polycythemia vera  Patient previously followed up by Dr.Corcoran, patient switched care to me on 11/16/20 Extensive medical record review was performed by me  Feb 2014 polycythemia vera and secondary myelofibrosis.  She presented in 04/2002 with thrombocytosis (835,000).  JAK2 V617F was positive   Bone marrow was on 04/15/2002 revealed a normocellular marrow for age with trilineage hematopoiesis and mild megakaryocytic hyperplasia with focal atypia.  There was no morphologic evidence of a myeloproliferative disorder.  Iron stains were inadequate for evaluation of storage iron.  There was no increase in reticulin fibers.  She was started on Agrylin in 2012  Bone marrow on 09/25/2012 revealed persistent myeloproliferative disorder s/p Agrylin.  The current features were best classified as persistent myeloproliferative neoplasm (MPN) JAK2 V617F mutation positive.  The current features of markedly increased atypical megakaryocytes paresis, with frequent clustering with frequent hyper/deeply lobulated forms, bizarre forms and forms with staghorn nuclei, hyperchromatic forms and abundant cytoplasm was highly suggestive of a myeloproliferative neoplasm.  Marrow was hypercellular for age (30-50%) with increased trilineage hematopoiesis and markedly increased atypical megakaryopoiesis with frequent clustering.  There were no increased blasts.  Iron stores were absent.  There was moderate to severe myelofibrosis (grade MF 2-3).  Differential  includes ET, PV, primary myelofibrosis, and MPN, unclassifiable.  Cytogenetics were normal (30, XX). She developed pancytopenia in 12/2015.  Etiology is unclear (medication confusion or hydroxyurea + Agrylin).   She received Procrit 40,000 units (intermittently 09/2012 - 06/2016), Venofer (09/2012 - 10/2012), and Infed (12/2015 - 03/2016).  She received Neupogen during a period of neutropenia.   She was anagrelide from 2004 - 08/13/2017.  She began [place] on 08/14/2017.  Jakafi was increased from 5 mg BID to 10 mg BID on 06/17/2018.  Jakafi was increased to 15 mg BID on 08/03/2018.  She has been back on Jakafi 5 mg BID since 02/01/2019 then 10 mg BID on 05/25/2019.  Laura Wilcox was on hold from 12/20/2019 - 01/12/2020.  Jakafi 5 mg twice daily restarted on 01/12/2020 then increased to 10 mg in the morning and 5 mg in the evening on 02/02/2020. Laura Wilcox is currently 20 mg po BID.  She has received Retacrit to maintain a hemoglobin > 10 (12/24/2019 and 03   Squamous cell carcinoma left cheek- s/p cryotherapy and debridement. Managed by Dr. Nehemiah Massed  10/22/2020- 10/26/2020 Hospitalization due to acute metabolic encephalopathy, UTI,E. coli bacteremia AKI on CKD, acute on chronic anemia. Laura Wilcox was held during the admission. She was discharged and spent a short period of time in rehab. [name] was held.   11/06/2020 seen by symptom management for weakness.  11/16/2020 established care with me. Resumed Jakafi at 10mg  BID  INTERVAL HISTORY Laura Wilcox is a 87 y.o. female who has above history reviewed by me today presents for follow up visit for management of polycythemia rubra vera and secondary myelofibrosis.   Patient was accompanied by daughter Laura Wilcox   Patient appears to be doing better. Appetite has improved.  Patient take Jakafi 10mg  BID  Past Medical History:  Diagnosis Date  Acute metabolic encephalopathy 4/65/6812   Confusion 12/30/2018   Polycythemia    Squamous cell carcinoma of skin  09/06/2020   R thumb - ED&C   Urinary tract infection without hematuria 01/02/2020   Urticaria 06/02/2017    Past Surgical History:  Procedure Laterality Date   ABDOMINAL HYSTERECTOMY     complete   BREAST SURGERY      Family History  Problem Relation Age of Onset   Heart disease Father    Cancer Sister        breast   Cancer Sister        breast   Alzheimer's disease Sister     Social History:  reports that she has never smoked. She has never used smokeless tobacco. She reports that she does not drink alcohol and does not use drugs. She is widowed.  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  She lives independently in her own apartment in [place]. Patient denies known exposures to radiation on toxins.  Pt's daughter, Laura Wilcox, can be reached at [phone].  She lives in [place]. The patient is accompanied by her daughter today.   Allergies:  Allergies  Allergen Reactions   Bactrim [Sulfamethoxazole-Trimethoprim]     Due to kidney disease     Current Medications: Current Outpatient Medications  Medication Sig Dispense Refill   acetaminophen (TYLENOL) 325 MG tablet Take 325-650 mg by mouth every 6 (six) hours as needed for mild pain or moderate pain.     aspirin EC 81 MG tablet Take 1 tablet (81 mg total) by mouth daily. Swallow whole. 30 tablet 0   ruxolitinib phosphate (JAKAFI) 10 MG tablet Take 1 tablet (10 mg total) by mouth 2 (two) times daily. 60 tablet 0   calcitRIOL (ROCALTROL) 0.25 MCG capsule Take 0.25 mcg by mouth every Monday, Wednesday, and Friday. (Patient not taking: Reported on 11/30/2020)     No current facility-administered medications for this visit.    Review of Systems  Unable to perform ROS: Dementia   Performance status (ECOG):  2  Vitals Blood pressure 113/68, pulse 84, temperature (!) 97.5 F (36.4 C), resp. rate 18, weight 108 lb 14.5 oz (49.4 kg).   Physical Exam Vitals reviewed.  Constitutional:      General: She is not in  acute distress.    Appearance: She is well-developed. She is not ill-appearing.  HENT:     Head: Normocephalic and atraumatic.     Nose: Nose normal. No congestion.     Mouth/Throat:     Pharynx: No oropharyngeal exudate.  Eyes:     General: No scleral icterus.    Conjunctiva/sclera: Conjunctivae normal.  Cardiovascular:     Rate and Rhythm: Normal rate and regular rhythm.     Pulses: Normal pulses.     Heart sounds: No murmur heard. Pulmonary:     Effort: Pulmonary effort is normal. No respiratory distress.  Abdominal:     General: There is no distension.     Tenderness: There is no abdominal tenderness. There is no guarding.  Musculoskeletal:        General: No deformity. Normal range of motion.     Right lower leg: No edema.     Left lower leg: No edema.     Comments: Ambulates without aids  Skin:    General: Skin is warm and dry.     Coloration: Skin is not pale.  Neurological:     Mental Status: She is alert. Mental status is  at baseline.     Motor: No weakness.  Psychiatric:        Attention and Perception: Attention normal.        Mood and Affect: Mood and affect normal.        Behavior: Behavior normal. Behavior is cooperative.        Cognition and Memory: Cognition is impaired. Memory is impaired.    Appointment on 11/30/2020  Component Date Value Ref Range Status   WBC 11/30/2020 8.9  4.0 - 10.5 K/uL Final   RBC 11/30/2020 3.18 (A) 3.87 - 5.11 MIL/uL Final   Hemoglobin 11/30/2020 9.2 (A) 12.0 - 15.0 g/dL Final   HCT 11/30/2020 29.8 (A) 36.0 - 46.0 % Final   MCV 11/30/2020 93.7  80.0 - 100.0 fL Final   MCH 11/30/2020 28.9  26.0 - 34.0 pg Final   MCHC 11/30/2020 30.9  30.0 - 36.0 g/dL Final   RDW 11/30/2020 24.4 (A) 11.5 - 15.5 % Final   Platelets 11/30/2020 593 (A) 150 - 400 K/uL Final   nRBC 11/30/2020 0.0  0.0 - 0.2 % Final   Neutrophils Relative % 11/30/2020 61  % Final   Neutro Abs 11/30/2020 5.5  1.7 - 7.7 K/uL Final   Lymphocytes Relative 11/30/2020  21  % Final   Lymphs Abs 11/30/2020 1.8  0.7 - 4.0 K/uL Final   Monocytes Relative 11/30/2020 8  % Final   Monocytes Absolute 11/30/2020 0.7  0.1 - 1.0 K/uL Final   Eosinophils Relative 11/30/2020 5  % Final   Eosinophils Absolute 11/30/2020 0.4  0.0 - 0.5 K/uL Final   Basophils Relative 11/30/2020 2  % Final   Basophils Absolute 11/30/2020 0.2 (A) 0.0 - 0.1 K/uL Final   Immature Granulocytes 11/30/2020 3  % Final   Abs Immature Granulocytes 11/30/2020 0.28 (A) 0.00 - 0.07 K/uL Final   Performed at [facility], [address], [place], [place] [address]   CMP     Component Value Date/Time   NA 135 11/16/2020 1401   K 3.7 11/16/2020 1401   CL 101 11/16/2020 1401   CO2 25 11/16/2020 1401   GLUCOSE 106 (H) 11/16/2020 1401   BUN 26 (H) 11/16/2020 1401   CREATININE 1.12 (H) 11/16/2020 1401   CALCIUM 9.1 11/16/2020 1401   PROT 7.1 11/16/2020 1401   ALBUMIN 3.7 11/16/2020 1401   AST 16 11/16/2020 1401   ALT 12 11/16/2020 1401   ALKPHOS 87 11/16/2020 1401   BILITOT 0.5 11/16/2020 1401   GFRNONAA 46 (L) 11/16/2020 1401   GFRAA 49 (L) 12/27/2019 0935    Assessment and Plan.  1. Anemia in stage 3b chronic kidney disease ([place])   2. Polycythemia vera ([place])   3. Myelofibrosis ([place])    # Polycythemia vera and  secondary myelofibrosis. Labs are reviewed and discussed with patient. Platelet count has decreased to 593000 Continue Jakafi 10mg  BID.  She tolerates well.    # Anemia due to CKD, and myelofibrosis.  She has been on low dose retacrit 10000 units monthly.  Adequate iron store.  Follow up in 2 weeks, repeat cbc +/- retacrit.   #Dementia/cognitive impairment- patient has hx of dementia. Per daughter, it has been difficult to convince patent to see neurology for further evaluation. She is not on any medication for her memory loss.  She has been referred to home health.   # Weakenss likely multifactorial. This maybe a new baseline for her  Normal TSH, B12,    #  Weight loss, malnutrition, probabaly due to decreased oral intake and decreased awake time.   Follow up in  5 weeks.   Earlie Server, MD, PhD Hematology Oncology [place] at [facility] 11/30/2020

## 2020-11-30 NOTE — Progress Notes (Signed)
Pt here for follow up. No new concerns voiced.   

## 2020-12-06 ENCOUNTER — Telehealth: Payer: Self-pay | Admitting: Primary Care

## 2020-12-06 NOTE — Telephone Encounter (Signed)
Attempted to contact daughter, Arrie Aran, to see if they were ready to reschedule the Palliative Consult, no answer - left message requesting a return call.

## 2020-12-14 ENCOUNTER — Inpatient Hospital Stay: Payer: Medicare Other

## 2020-12-14 ENCOUNTER — Other Ambulatory Visit: Payer: Self-pay

## 2020-12-14 VITALS — BP 138/73 | HR 90 | Resp 18

## 2020-12-14 DIAGNOSIS — D631 Anemia in chronic kidney disease: Secondary | ICD-10-CM | POA: Diagnosis not present

## 2020-12-14 DIAGNOSIS — D45 Polycythemia vera: Secondary | ICD-10-CM

## 2020-12-14 DIAGNOSIS — N1832 Chronic kidney disease, stage 3b: Secondary | ICD-10-CM | POA: Diagnosis not present

## 2020-12-14 DIAGNOSIS — C44329 Squamous cell carcinoma of skin of other parts of face: Secondary | ICD-10-CM | POA: Diagnosis not present

## 2020-12-14 DIAGNOSIS — D7581 Myelofibrosis: Secondary | ICD-10-CM | POA: Diagnosis not present

## 2020-12-14 DIAGNOSIS — R531 Weakness: Secondary | ICD-10-CM | POA: Diagnosis not present

## 2020-12-14 LAB — HEMOGLOBIN AND HEMATOCRIT, BLOOD
HCT: 30.5 % — ABNORMAL LOW (ref 36.0–46.0)
Hemoglobin: 9.6 g/dL — ABNORMAL LOW (ref 12.0–15.0)

## 2020-12-14 MED ORDER — EPOETIN ALFA-EPBX 10000 UNIT/ML IJ SOLN
10000.0000 [IU] | Freq: Once | INTRAMUSCULAR | Status: AC
  Administered 2020-12-14: 10000 [IU] via SUBCUTANEOUS

## 2020-12-15 ENCOUNTER — Telehealth: Payer: Self-pay | Admitting: Primary Care

## 2020-12-15 NOTE — Telephone Encounter (Signed)
Attempted to contact patient's daugher, Arrie Aran, to offer to reschedule the Palliative Consult - no answer.  Left message requesting a return call to let me know if they wish to pursue Palliative services or not.

## 2020-12-18 ENCOUNTER — Other Ambulatory Visit (HOSPITAL_COMMUNITY): Payer: Self-pay

## 2020-12-20 ENCOUNTER — Telehealth: Payer: Self-pay

## 2020-12-20 NOTE — Telephone Encounter (Signed)
Call to daughter [name] to schedule palliative referral.  [name] states that patient is not yet ready to accept palliative care.  Discussed cancelling referral and she is in agreement and will let us or cancer center know when they are ready. Report to palliative admin team

## 2020-12-21 ENCOUNTER — Other Ambulatory Visit (HOSPITAL_COMMUNITY): Payer: Self-pay

## 2020-12-29 ENCOUNTER — Other Ambulatory Visit: Payer: Self-pay | Admitting: Oncology

## 2020-12-29 ENCOUNTER — Other Ambulatory Visit (HOSPITAL_COMMUNITY): Payer: Self-pay

## 2020-12-29 DIAGNOSIS — D7581 Myelofibrosis: Secondary | ICD-10-CM

## 2020-12-29 MED ORDER — RUXOLITINIB PHOSPHATE 10 MG PO TABS
10.0000 mg | ORAL_TABLET | Freq: Two times a day (BID) | ORAL | 0 refills | Status: DC
  Filled 2020-12-29: qty 60, 30d supply, fill #0

## 2020-12-30 ENCOUNTER — Other Ambulatory Visit (HOSPITAL_COMMUNITY): Payer: Self-pay

## 2021-01-01 ENCOUNTER — Other Ambulatory Visit: Payer: Self-pay | Admitting: *Deleted

## 2021-01-01 ENCOUNTER — Other Ambulatory Visit (HOSPITAL_COMMUNITY): Payer: Self-pay

## 2021-01-01 DIAGNOSIS — D7581 Myelofibrosis: Secondary | ICD-10-CM

## 2021-01-02 ENCOUNTER — Inpatient Hospital Stay: Payer: Medicare Other

## 2021-01-02 ENCOUNTER — Inpatient Hospital Stay (HOSPITAL_BASED_OUTPATIENT_CLINIC_OR_DEPARTMENT_OTHER): Payer: Medicare Other | Admitting: Oncology

## 2021-01-02 ENCOUNTER — Inpatient Hospital Stay: Payer: Medicare Other | Attending: Oncology

## 2021-01-02 ENCOUNTER — Encounter: Payer: Self-pay | Admitting: Oncology

## 2021-01-02 VITALS — BP 136/73 | HR 77 | Temp 97.8°F | Resp 19 | Wt 111.4 lb

## 2021-01-02 DIAGNOSIS — F0390 Unspecified dementia without behavioral disturbance: Secondary | ICD-10-CM | POA: Diagnosis not present

## 2021-01-02 DIAGNOSIS — Z8249 Family history of ischemic heart disease and other diseases of the circulatory system: Secondary | ICD-10-CM | POA: Insufficient documentation

## 2021-01-02 DIAGNOSIS — D7581 Myelofibrosis: Secondary | ICD-10-CM | POA: Insufficient documentation

## 2021-01-02 DIAGNOSIS — N1832 Chronic kidney disease, stage 3b: Secondary | ICD-10-CM | POA: Diagnosis not present

## 2021-01-02 DIAGNOSIS — G9341 Metabolic encephalopathy: Secondary | ICD-10-CM | POA: Insufficient documentation

## 2021-01-02 DIAGNOSIS — D631 Anemia in chronic kidney disease: Secondary | ICD-10-CM | POA: Insufficient documentation

## 2021-01-02 DIAGNOSIS — R634 Abnormal weight loss: Secondary | ICD-10-CM | POA: Diagnosis not present

## 2021-01-02 DIAGNOSIS — D709 Neutropenia, unspecified: Secondary | ICD-10-CM | POA: Diagnosis not present

## 2021-01-02 DIAGNOSIS — R4189 Other symptoms and signs involving cognitive functions and awareness: Secondary | ICD-10-CM | POA: Insufficient documentation

## 2021-01-02 DIAGNOSIS — Z803 Family history of malignant neoplasm of breast: Secondary | ICD-10-CM | POA: Insufficient documentation

## 2021-01-02 DIAGNOSIS — R7881 Bacteremia: Secondary | ICD-10-CM | POA: Diagnosis not present

## 2021-01-02 DIAGNOSIS — D45 Polycythemia vera: Secondary | ICD-10-CM | POA: Diagnosis not present

## 2021-01-02 DIAGNOSIS — Z881 Allergy status to other antibiotic agents status: Secondary | ICD-10-CM | POA: Diagnosis not present

## 2021-01-02 DIAGNOSIS — N179 Acute kidney failure, unspecified: Secondary | ICD-10-CM | POA: Diagnosis not present

## 2021-01-02 DIAGNOSIS — E46 Unspecified protein-calorie malnutrition: Secondary | ICD-10-CM | POA: Diagnosis not present

## 2021-01-02 DIAGNOSIS — Z85828 Personal history of other malignant neoplasm of skin: Secondary | ICD-10-CM | POA: Diagnosis not present

## 2021-01-02 DIAGNOSIS — Z87440 Personal history of urinary (tract) infections: Secondary | ICD-10-CM | POA: Diagnosis not present

## 2021-01-02 DIAGNOSIS — Z79899 Other long term (current) drug therapy: Secondary | ICD-10-CM | POA: Insufficient documentation

## 2021-01-02 DIAGNOSIS — D471 Chronic myeloproliferative disease: Secondary | ICD-10-CM | POA: Insufficient documentation

## 2021-01-02 DIAGNOSIS — Z818 Family history of other mental and behavioral disorders: Secondary | ICD-10-CM | POA: Insufficient documentation

## 2021-01-02 LAB — COMPREHENSIVE METABOLIC PANEL
ALT: 13 U/L (ref 0–44)
AST: 19 U/L (ref 15–41)
Albumin: 4.1 g/dL (ref 3.5–5.0)
Alkaline Phosphatase: 82 U/L (ref 38–126)
Anion gap: 8 (ref 5–15)
BUN: 28 mg/dL — ABNORMAL HIGH (ref 8–23)
CO2: 26 mmol/L (ref 22–32)
Calcium: 8.8 mg/dL — ABNORMAL LOW (ref 8.9–10.3)
Chloride: 100 mmol/L (ref 98–111)
Creatinine, Ser: 1.21 mg/dL — ABNORMAL HIGH (ref 0.44–1.00)
GFR, Estimated: 42 mL/min — ABNORMAL LOW (ref >60)
Glucose, Bld: 93 mg/dL (ref 70–99)
Potassium: 4.1 mmol/L (ref 3.5–5.1)
Sodium: 134 mmol/L — ABNORMAL LOW (ref 135–145)
Total Bilirubin: 0.3 mg/dL (ref 0.3–1.2)
Total Protein: 6.9 g/dL (ref 6.5–8.1)

## 2021-01-02 LAB — CBC WITH DIFFERENTIAL/PLATELET
Abs Immature Granulocytes: 0.29 10*3/uL — ABNORMAL HIGH (ref 0.00–0.07)
Basophils Absolute: 0.1 10*3/uL (ref 0.0–0.1)
Basophils Relative: 2 %
Eosinophils Absolute: 0.4 10*3/uL (ref 0.0–0.5)
Eosinophils Relative: 5 %
HCT: 31.9 % — ABNORMAL LOW (ref 36.0–46.0)
Hemoglobin: 9.7 g/dL — ABNORMAL LOW (ref 12.0–15.0)
Immature Granulocytes: 4 %
Lymphocytes Relative: 22 %
Lymphs Abs: 1.8 10*3/uL (ref 0.7–4.0)
MCH: 28.7 pg (ref 26.0–34.0)
MCHC: 30.4 g/dL (ref 30.0–36.0)
MCV: 94.4 fL (ref 80.0–100.0)
Monocytes Absolute: 0.7 10*3/uL (ref 0.1–1.0)
Monocytes Relative: 9 %
Neutro Abs: 4.9 10*3/uL (ref 1.7–7.7)
Neutrophils Relative %: 58 %
Platelets: 472 10*3/uL — ABNORMAL HIGH (ref 150–400)
RBC: 3.38 MIL/uL — ABNORMAL LOW (ref 3.87–5.11)
RDW: 23.4 % — ABNORMAL HIGH (ref 11.5–15.5)
WBC: 8.2 10*3/uL (ref 4.0–10.5)
nRBC: 0.2 % (ref 0.0–0.2)

## 2021-01-02 MED ORDER — EPOETIN ALFA-EPBX 10000 UNIT/ML IJ SOLN
10000.0000 [IU] | Freq: Once | INTRAMUSCULAR | Status: AC
  Administered 2021-01-02: 10000 [IU] via SUBCUTANEOUS
  Filled 2021-01-02: qty 1

## 2021-01-02 NOTE — Progress Notes (Signed)
Hematology Oncology Progress Note   Clinic Day:  01/02/2021   Referring physician: Earlie Server, MD  Chief Complaint: Laura [facility] is a 87 y.o. female with polycythemia vera (PV) presents for follow up   Laura [facility] is a 87 y.o.afemale who has above oncology history reviewed by me today presented for follow up visit for management of  polycythemia vera  Patient previously followed up by Dr.Corcoran, patient switched care to me on 11/16/20 Extensive medical record review was performed by me  Feb 2014 polycythemia vera and secondary myelofibrosis.  She presented in 04/2002 with thrombocytosis (835,000).  JAK2 V617F was positive   Bone marrow was on 04/15/2002 revealed a normocellular marrow for age with trilineage hematopoiesis and mild megakaryocytic hyperplasia with focal atypia.  There was no morphologic evidence of a myeloproliferative disorder.  Iron stains were inadequate for evaluation of storage iron.  There was no increase in reticulin fibers.  She was started on Agrylin in 2012  Bone marrow on 09/25/2012 revealed persistent myeloproliferative disorder s/p Agrylin.  The current features were best classified as persistent myeloproliferative neoplasm (MPN) JAK2 V617F mutation positive.  The current features of markedly increased atypical megakaryocytes paresis, with frequent clustering with frequent hyper/deeply lobulated forms, bizarre forms and forms with staghorn nuclei, hyperchromatic forms and abundant cytoplasm was highly suggestive of a myeloproliferative neoplasm.  Marrow was hypercellular for age (30-50%) with increased trilineage hematopoiesis and markedly increased atypical megakaryopoiesis with frequent clustering.  There were no increased blasts.  Iron stores were absent.  There was moderate to severe myelofibrosis (grade MF 2-3).  Differential includes ET, PV, primary myelofibrosis, and MPN, unclassifiable.  Cytogenetics were normal (67, XX). She  developed pancytopenia in 12/2015.  Etiology is unclear (medication confusion or hydroxyurea + Agrylin).   She received Procrit 40,000 units (intermittently 09/2012 - 06/2016), Venofer (09/2012 - 10/2012), and Infed (12/2015 - 03/2016).  She received Neupogen during a period of neutropenia.   She was anagrelide from 2004 - 08/13/2017.  She began [place] on 08/14/2017.  Jakafi was increased from 5 mg BID to 10 mg BID on 06/17/2018.  Jakafi was increased to 15 mg BID on 08/03/2018.  She has been back on Jakafi 5 mg BID since 02/01/2019 then 10 mg BID on 05/25/2019.  [name] was on hold from 12/20/2019 - 01/12/2020.  Jakafi 5 mg twice daily restarted on 01/12/2020 then increased to 10 mg in the morning and 5 mg in the evening on 02/02/2020. [name] is currently 20 mg po BID.  She has received Retacrit to maintain a hemoglobin > 10 (12/24/2019 and 03   Squamous cell carcinoma left cheek- s/p cryotherapy and debridement. Managed by Dr. Nehemiah Massed  10/22/2020- 10/26/2020 Hospitalization due to acute metabolic encephalopathy, UTI,E. coli bacteremia AKI on CKD, acute on chronic anemia. [name] was held during the admission. She was discharged and spent a short period of time in rehab. [name] was held.   11/06/2020 seen by symptom management for weakness.  11/16/2020 established care with me. Resumed Jakafi at 10mg  BID  #Dementia/cognitive impairment- patient has hx of dementia. Per daughter, it has been difficult to convince patent to see neurology for further evaluation. She is not on any medication for her memory loss.  She has been referred to home health.   INTERVAL HISTORY Laura [facility] is a 87 y.o. female who has above history reviewed by me today presents for follow up visit for management of polycythemia rubra vera and secondary myelofibrosis.   Patient was accompanied by  daughter [name]   Patient appears to be doing better. Appetite has improved and she has gained 3 pounds.  Patient is currently taking  Jakafi 10mg  BID  Past Medical History:  Diagnosis Date   Acute metabolic encephalopathy 6/76/1948   Confusion 12/30/2018   Polycythemia    Squamous cell carcinoma of skin 09/06/2020   R thumb - ED&C   Urinary tract infection without hematuria 01/02/2020   Urticaria 06/02/2017    Past Surgical History:  Procedure Laterality Date   ABDOMINAL HYSTERECTOMY     complete   BREAST SURGERY      Family History  Problem Relation Age of Onset   Heart disease Father    Cancer Sister        breast   Cancer Sister        breast   Alzheimer's disease Sister     Social History:  reports that she has never smoked. She has never used smokeless tobacco. She reports that she does not drink alcohol and does not use drugs. She is widowed.  Patient is retired Sport and exercise psychologist. Patient moved to [facility] in 03/2017.  She lives independently in her own apartment in [place]. Patient denies known exposures to radiation on toxins.  Pt's daughter, [name], can be reached at [phone].  She lives in [place]. The patient is accompanied by her daughter today.   Allergies:  Allergies  Allergen Reactions   Bactrim [Sulfamethoxazole-Trimethoprim]     Due to kidney disease     Current Medications: Current Outpatient Medications  Medication Sig Dispense Refill   acetaminophen (TYLENOL) 325 MG tablet Take 325-650 mg by mouth every 6 (six) hours as needed for mild pain or moderate pain.     aspirin EC 81 MG tablet Take 1 tablet (81 mg total) by mouth daily. Swallow whole. 30 tablet 0   calcitRIOL (ROCALTROL) 0.25 MCG capsule Take 0.25 mcg by mouth every Monday, Wednesday, and Friday.     ruxolitinib phosphate (JAKAFI) 10 MG tablet Take 1 tablet (10 mg total) by mouth 2 (two) times daily. 60 tablet 0   No current facility-administered medications for this visit.    Review of Systems  Unable to perform ROS: Dementia   Performance status (ECOG):  2  Vitals Blood pressure 136/73, pulse 77, temperature 97.8 F (36.6 C),  resp. rate 19, weight 111 lb 6.4 oz (50.5 kg), SpO2 98 %.   Physical Exam Vitals reviewed.  Constitutional:      General: She is not in acute distress.    Appearance: She is well-developed. She is not ill-appearing.  HENT:     Head: Normocephalic and atraumatic.     Nose: Nose normal. No congestion.     Mouth/Throat:     Pharynx: No oropharyngeal exudate.  Eyes:     General: No scleral icterus.    Conjunctiva/sclera: Conjunctivae normal.  Cardiovascular:     Rate and Rhythm: Normal rate and regular rhythm.     Pulses: Normal pulses.     Heart sounds: No murmur heard. Pulmonary:     Effort: Pulmonary effort is normal. No respiratory distress.  Abdominal:     General: There is no distension.     Tenderness: There is no abdominal tenderness. There is no guarding.  Musculoskeletal:        General: No deformity. Normal range of motion.     Right lower leg: No edema.     Left lower leg: No edema.     Comments: Ambulates without aids  Skin:  General: Skin is warm and dry.     Coloration: Skin is not pale.  Neurological:     Mental Status: She is alert. Mental status is at baseline.     Motor: No weakness.  Psychiatric:        Attention and Perception: Attention normal.        Mood and Affect: Mood and affect normal.        Behavior: Behavior normal. Behavior is cooperative.        Cognition and Memory: Cognition is impaired. Memory is impaired.    Appointment on 01/02/2021  Component Date Value Ref Range Status   Sodium 01/02/2021 134 (A) 135 - 145 mmol/L Final   Potassium 01/02/2021 4.1  3.5 - 5.1 mmol/L Final   Chloride 01/02/2021 100  98 - 111 mmol/L Final   CO2 01/02/2021 26  22 - 32 mmol/L Final   Glucose, Bld 01/02/2021 93  70 - 99 mg/dL Final   Glucose reference range applies only to samples taken after fasting for at least 8 hours.   BUN 01/02/2021 28 (A) 8 - 23 mg/dL Final   Creatinine, Ser 01/02/2021 1.21 (A) 0.44 - 1.00 mg/dL Final   Calcium 01/02/2021 8.8  (A) 8.9 - 10.3 mg/dL Final   Total Protein 01/02/2021 6.9  6.5 - 8.1 g/dL Final   Albumin 01/02/2021 4.1  3.5 - 5.0 g/dL Final   AST 01/02/2021 19  15 - 41 U/L Final   ALT 01/02/2021 13  0 - 44 U/L Final   Alkaline Phosphatase 01/02/2021 82  38 - 126 U/L Final   Total Bilirubin 01/02/2021 0.3  0.3 - 1.2 mg/dL Final   GFR, Estimated 01/02/2021 42 (A) >60 mL/min Final   Comment: (NOTE) Calculated using the CKD-EPI Creatinine Equation (2021)    Anion gap 01/02/2021 8  5 - 15 Final   Performed at [facility], [place], [place] [address]   WBC 01/02/2021 8.2  4.0 - 10.5 K/uL Final   RBC 01/02/2021 3.38 (A) 3.87 - 5.11 MIL/uL Final   Hemoglobin 01/02/2021 9.7 (A) 12.0 - 15.0 g/dL Final   HCT 01/02/2021 31.9 (A) 36.0 - 46.0 % Final   MCV 01/02/2021 94.4  80.0 - 100.0 fL Final   MCH 01/02/2021 28.7  26.0 - 34.0 pg Final   MCHC 01/02/2021 30.4  30.0 - 36.0 g/dL Final   RDW 01/02/2021 23.4 (A) 11.5 - 15.5 % Final   Platelets 01/02/2021 472 (A) 150 - 400 K/uL Final   nRBC 01/02/2021 0.2  0.0 - 0.2 % Final   Neutrophils Relative % 01/02/2021 58  % Final   Neutro Abs 01/02/2021 4.9  1.7 - 7.7 K/uL Final   Lymphocytes Relative 01/02/2021 22  % Final   Lymphs Abs 01/02/2021 1.8  0.7 - 4.0 K/uL Final   Monocytes Relative 01/02/2021 9  % Final   Monocytes Absolute 01/02/2021 0.7  0.1 - 1.0 K/uL Final   Eosinophils Relative 01/02/2021 5  % Final   Eosinophils Absolute 01/02/2021 0.4  0.0 - 0.5 K/uL Final   Basophils Relative 01/02/2021 2  % Final   Basophils Absolute 01/02/2021 0.1  0.0 - 0.1 K/uL Final   Immature Granulocytes 01/02/2021 4  % Final   Abs Immature Granulocytes 01/02/2021 0.29 (A) 0.00 - 0.07 K/uL Final   Performed at [facility], [place], [place] [address]   [facility]     Component Value Date/Time   NA 134 (L) 01/02/2021  1339   K 4.1 01/02/2021 1339   CL 100 01/02/2021 1339   CO2 26 01/02/2021 1339   GLUCOSE 93 01/02/2021 1339   BUN  28 (H) 01/02/2021 1339   CREATININE 1.21 (H) 01/02/2021 1339   CALCIUM 8.8 (L) 01/02/2021 1339   PROT 6.9 01/02/2021 1339   ALBUMIN 4.1 01/02/2021 1339   AST 19 01/02/2021 1339   ALT 13 01/02/2021 1339   ALKPHOS 82 01/02/2021 1339   BILITOT 0.3 01/02/2021 1339   GFRNONAA 42 (L) 01/02/2021 1339   GFRAA 49 (L) 12/27/2019 0935    Assessment and Plan.  1. Myelofibrosis ([place])   2. Polycythemia vera ([place])   3. Anemia in stage 3b chronic kidney disease ([place])   4. Weight loss    # Polycythemia vera and  secondary myelofibrosis. Labs are reviewed and discussed with patient. Platelet count has improved to 472,000.  Continue Jakafi 10mg  BID.   # Anemia due to CKD, and myelofibrosis.  She has been on low dose retacrit 10000 units every 2 weeks.  Adequate iron store.  Follow up in 2 weeks, 4 weeks,   repeat cbc +/- retacrit.    # Weight loss, malnutrition, probabaly due to decreased oral intake and decreased awake time.  She has gained some weight since last visit.   Follow up in  lab MD 8 weeks.   Earlie Server, MD, PhD Hematology Oncology [place] at [facility] 01/02/2021

## 2021-01-03 ENCOUNTER — Other Ambulatory Visit: Payer: Self-pay

## 2021-01-03 ENCOUNTER — Ambulatory Visit (INDEPENDENT_AMBULATORY_CARE_PROVIDER_SITE_OTHER): Payer: Medicare Other | Admitting: Dermatology

## 2021-01-03 DIAGNOSIS — Z85828 Personal history of other malignant neoplasm of skin: Secondary | ICD-10-CM | POA: Diagnosis not present

## 2021-01-03 DIAGNOSIS — L821 Other seborrheic keratosis: Secondary | ICD-10-CM | POA: Diagnosis not present

## 2021-01-03 DIAGNOSIS — D485 Neoplasm of uncertain behavior of skin: Secondary | ICD-10-CM

## 2021-01-03 DIAGNOSIS — D0461 Carcinoma in situ of skin of right upper limb, including shoulder: Secondary | ICD-10-CM | POA: Diagnosis not present

## 2021-01-03 DIAGNOSIS — D0462 Carcinoma in situ of skin of left upper limb, including shoulder: Secondary | ICD-10-CM | POA: Diagnosis not present

## 2021-01-03 DIAGNOSIS — D0439 Carcinoma in situ of skin of other parts of face: Secondary | ICD-10-CM

## 2021-01-03 DIAGNOSIS — L578 Other skin changes due to chronic exposure to nonionizing radiation: Secondary | ICD-10-CM | POA: Diagnosis not present

## 2021-01-03 MED ORDER — FLUOROURACIL 5 % EX CREA: TOPICAL_CREAM | CUTANEOUS | 2 refills | Status: DC

## 2021-01-03 NOTE — Progress Notes (Signed)
Follow-Up Visit   Subjective  Laura Wilcox is a 87 y.o. female who presents for the following: Actinic Keratosis (Previously tx with LN2 - check for new or persistent skin lesions today). Patient has numerous scattered crusted lesions on the face and extremities today.   The following portions of the chart were reviewed this encounter and updated as appropriate:   Tobacco  Allergies  Meds  Problems  Med Hx  Surg Hx  Fam Hx     Review of Systems:  No other skin or systemic complaints except as noted in HPI or Assessment and Plan.  Objective  Well appearing patient in no apparent distress; mood and affect are within normal limits.  A focused examination was performed including the face and extremities. Relevant physical exam findings are noted in the Assessment and Plan.  R forearm 1.2 cm hyperkeratotic plaque  L forearm 2.2 cm Hyperkeratotic plaque     L cheek 2.4 cm hyperkeratotic plaque      R med dorsum hand 2.2 cm hyperkeratotic plaque     R ankle 1.6 x 2.0 hyperkeratotic plaque      Assessment & Plan  Neoplasm of uncertain behavior of skin (5) Consistent with  SCCIS (squamous cell carcinoma in situ) - due to patient's adverse reaction to either lidocaine or epinephrine we will avoid anesthesia and biopsy and treatment that involves anesthesia.   Patient has responded well to treating multiple squamous cell carcinoma in situ's with LN2 in the past. R forearm - defect 1.6 cm  Destruction of lesion Complexity: simple   Destruction method: cryotherapy   Informed consent: discussed and consent obtained   Timeout:  patient name, date of birth, surgical site, and procedure verified Lesion destroyed using liquid nitrogen: Yes   Region frozen until ice ball extended beyond lesion: Yes   Outcome: patient tolerated procedure well with no complications   Post-procedure details: wound care instructions given    L forearm - defect 2.6 cm  Destruction of  lesion Complexity: simple   Destruction method: cryotherapy   Informed consent: discussed and consent obtained   Timeout:  patient name, date of birth, surgical site, and procedure verified Lesion destroyed using liquid nitrogen: Yes   Region frozen until ice ball extended beyond lesion: Yes   Outcome: patient tolerated procedure well with no complications   Post-procedure details: wound care instructions given    L cheek - defect 2.8 cm  Destruction of lesion Complexity: simple   Destruction method: cryotherapy   Informed consent: discussed and consent obtained   Timeout:  patient name, date of birth, surgical site, and procedure verified Lesion destroyed using liquid nitrogen: Yes   Region frozen until ice ball extended beyond lesion: Yes   Outcome: patient tolerated procedure well with no complications   Post-procedure details: wound care instructions given    R med dorsum hand - defect 2.6 cm  Destruction of lesion Complexity: simple   Destruction method: cryotherapy   Informed consent: discussed and consent obtained   Timeout:  patient name, date of birth, surgical site, and procedure verified Lesion destroyed using liquid nitrogen: Yes   Region frozen until ice ball extended beyond lesion: Yes   Outcome: patient tolerated procedure well with no complications   Post-procedure details: wound care instructions given    R ankle - 1.6 x 2.0 cm  fluorouracil (EFUDEX) 5 % cream Apply to lesion on the right lower leg BID x 1 week. Take a 3 week break then repeat treatment.  Do this for 3 rounds.  Consistent with  SCCIS (squamous cell carcinoma in situ) - due to patient's adverse reaction to either lidocaine or epinephrine we will avoid anesthesia and biopsy and treatment that involves anesthesia.   Patient has responded well to treating multiple squamous cell carcinoma in situ's with LN2 in the past.  Actinic Damage - Severe, confluent actinic changes with pre-cancerous actinic  keratoses  - Severe, chronic, not at goal, secondary to cumulative UV radiation exposure over time - diffuse scaly erythematous macules and papules with underlying dyspigmentation - Discussed Prescription "Field Treatment" for Severe, Chronic Confluent Actinic Changes with Pre-Cancerous Actinic Keratoses Field treatment involves treatment of an entire area of skin that has confluent Actinic Changes (Sun/ Ultraviolet light damage) and PreCancerous Actinic Keratoses by method of PhotoDynamic Therapy (PDT) and/or prescription Topical Chemotherapy agents such as 5-fluorouracil, 5-fluorouracil/calcipotriene, and/or imiquimod.  The purpose is to decrease the number of clinically evident and subclinical PreCancerous lesions to prevent progression to development of skin cancer by chemically destroying early precancer changes that may or may not be visible.  It has been shown to reduce the risk of developing skin cancer in the treated area. As a result of treatment, redness, scaling, crusting, and open sores may occur during treatment course. One or more than one of these methods may be used and may have to be used several times to control, suppress and eliminate the PreCancerous changes. Discussed treatment course, expected reaction, and possible side effects. - Recommend daily broad spectrum sunscreen SPF 30+ to sun-exposed areas, reapply every 2 hours as needed.  - Staying in the shade or wearing long sleeves, sun glasses (UVA+UVB protection) and wide brim hats (4-inch brim around the entire circumference of the hat) are also recommended. - Call for new or changing lesions. To the R med ankle start 5FU/Calcipotriene BID x 1 week. Use one week on and 3 weeks off. Use for 3 rounds.   History of Squamous Cell Carcinoma of the Skin - No evidence of recurrence today - No lymphadenopathy - Recommend regular full body skin exams - Recommend daily broad spectrum sunscreen SPF 30+ to sun-exposed areas, reapply every  2 hours as needed.  - Call if any new or changing lesions are noted between office visits  Actinic Damage - chronic, secondary to cumulative UV radiation exposure/sun exposure over time - diffuse scaly erythematous macules with underlying dyspigmentation - Recommend daily broad spectrum sunscreen SPF 30+ to sun-exposed areas, reapply every 2 hours as needed.  - Recommend staying in the shade or wearing long sleeves, sun glasses (UVA+UVB protection) and wide brim hats (4-inch brim around the entire circumference of the hat). - Call for new or changing lesions.  Seborrheic Keratoses - Stuck-on, waxy, tan-brown papules and/or plaques  - Benign-appearing - Discussed benign etiology and prognosis. - Observe - Call for any changes  Return in about 3 months (around 04/05/2021).  Luther Redo, CMA, am acting as scribe for Sarina Ser, MD .  Documentation: I have reviewed the above documentation for accuracy and completeness, and I agree with the above.  Sarina Ser, MD

## 2021-01-03 NOTE — Patient Instructions (Signed)

## 2021-01-09 ENCOUNTER — Encounter: Payer: Self-pay | Admitting: Dermatology

## 2021-01-16 ENCOUNTER — Other Ambulatory Visit: Payer: Self-pay

## 2021-01-16 ENCOUNTER — Inpatient Hospital Stay: Payer: Medicare Other

## 2021-01-16 DIAGNOSIS — D7581 Myelofibrosis: Secondary | ICD-10-CM

## 2021-01-16 DIAGNOSIS — N1832 Chronic kidney disease, stage 3b: Secondary | ICD-10-CM | POA: Diagnosis not present

## 2021-01-16 DIAGNOSIS — R634 Abnormal weight loss: Secondary | ICD-10-CM

## 2021-01-16 DIAGNOSIS — R5383 Other fatigue: Secondary | ICD-10-CM

## 2021-01-16 DIAGNOSIS — D45 Polycythemia vera: Secondary | ICD-10-CM

## 2021-01-16 DIAGNOSIS — D631 Anemia in chronic kidney disease: Secondary | ICD-10-CM

## 2021-01-16 DIAGNOSIS — G9341 Metabolic encephalopathy: Secondary | ICD-10-CM | POA: Diagnosis not present

## 2021-01-16 DIAGNOSIS — D709 Neutropenia, unspecified: Secondary | ICD-10-CM | POA: Diagnosis not present

## 2021-01-16 LAB — CBC WITH DIFFERENTIAL/PLATELET
Abs Immature Granulocytes: 0.21 10*3/uL — ABNORMAL HIGH (ref 0.00–0.07)
Basophils Absolute: 0.1 10*3/uL (ref 0.0–0.1)
Basophils Relative: 1 %
Eosinophils Absolute: 0.3 10*3/uL (ref 0.0–0.5)
Eosinophils Relative: 4 %
HCT: 32.5 % — ABNORMAL LOW (ref 36.0–46.0)
Hemoglobin: 10 g/dL — ABNORMAL LOW (ref 12.0–15.0)
Immature Granulocytes: 2 %
Lymphocytes Relative: 18 %
Lymphs Abs: 1.6 10*3/uL (ref 0.7–4.0)
MCH: 29.4 pg (ref 26.0–34.0)
MCHC: 30.8 g/dL (ref 30.0–36.0)
MCV: 95.6 fL (ref 80.0–100.0)
Monocytes Absolute: 0.8 10*3/uL (ref 0.1–1.0)
Monocytes Relative: 9 %
Neutro Abs: 5.8 10*3/uL (ref 1.7–7.7)
Neutrophils Relative %: 66 %
Platelets: 531 10*3/uL — ABNORMAL HIGH (ref 150–400)
RBC: 3.4 MIL/uL — ABNORMAL LOW (ref 3.87–5.11)
RDW: 23.6 % — ABNORMAL HIGH (ref 11.5–15.5)
WBC: 8.8 10*3/uL (ref 4.0–10.5)
nRBC: 0 % (ref 0.0–0.2)

## 2021-01-17 ENCOUNTER — Inpatient Hospital Stay: Payer: Medicare Other

## 2021-01-17 ENCOUNTER — Inpatient Hospital Stay: Payer: Medicare Other | Admitting: Hospice and Palliative Medicine

## 2021-01-24 ENCOUNTER — Telehealth: Payer: Self-pay | Admitting: Oncology

## 2021-01-24 NOTE — Telephone Encounter (Signed)
Daughter called and wants to change appt. To 11-2 or 11-3 in the afternoon. Want to talk to RN about labs. Please give her a call at [phone]

## 2021-01-25 ENCOUNTER — Other Ambulatory Visit: Payer: Self-pay

## 2021-01-25 DIAGNOSIS — D45 Polycythemia vera: Secondary | ICD-10-CM

## 2021-01-25 NOTE — Telephone Encounter (Signed)
CBC and CMP will be added to future lab visits per request. MD notified.

## 2021-01-25 NOTE — Telephone Encounter (Signed)
Spoke to [place] patients daughter and verified that she wants cbc and cmp draw each time.  Benjamine Mola RN updated lab orders and ok'd the patient moving her appointments from 11/1 to 11/2 however each subsequent appointment has to be moved back 1 day as well.  Appointments updated, daughter notified and confirmed.

## 2021-01-30 ENCOUNTER — Inpatient Hospital Stay: Payer: Medicare Other

## 2021-01-30 DIAGNOSIS — N2581 Secondary hyperparathyroidism of renal origin: Secondary | ICD-10-CM | POA: Diagnosis not present

## 2021-01-30 DIAGNOSIS — D45 Polycythemia vera: Secondary | ICD-10-CM | POA: Diagnosis not present

## 2021-01-30 DIAGNOSIS — N1832 Chronic kidney disease, stage 3b: Secondary | ICD-10-CM | POA: Diagnosis not present

## 2021-01-30 DIAGNOSIS — D631 Anemia in chronic kidney disease: Secondary | ICD-10-CM | POA: Diagnosis not present

## 2021-01-31 ENCOUNTER — Other Ambulatory Visit: Payer: Self-pay

## 2021-01-31 ENCOUNTER — Inpatient Hospital Stay: Payer: Medicare Other | Attending: Oncology

## 2021-01-31 ENCOUNTER — Inpatient Hospital Stay: Payer: Medicare Other

## 2021-01-31 DIAGNOSIS — Z79899 Other long term (current) drug therapy: Secondary | ICD-10-CM | POA: Insufficient documentation

## 2021-01-31 DIAGNOSIS — N1832 Chronic kidney disease, stage 3b: Secondary | ICD-10-CM | POA: Insufficient documentation

## 2021-01-31 DIAGNOSIS — Z23 Encounter for immunization: Secondary | ICD-10-CM | POA: Diagnosis not present

## 2021-01-31 DIAGNOSIS — Z881 Allergy status to other antibiotic agents status: Secondary | ICD-10-CM | POA: Diagnosis not present

## 2021-01-31 DIAGNOSIS — Z8249 Family history of ischemic heart disease and other diseases of the circulatory system: Secondary | ICD-10-CM | POA: Insufficient documentation

## 2021-01-31 DIAGNOSIS — D471 Chronic myeloproliferative disease: Secondary | ICD-10-CM | POA: Diagnosis not present

## 2021-01-31 DIAGNOSIS — Z85828 Personal history of other malignant neoplasm of skin: Secondary | ICD-10-CM | POA: Diagnosis not present

## 2021-01-31 DIAGNOSIS — D709 Neutropenia, unspecified: Secondary | ICD-10-CM | POA: Insufficient documentation

## 2021-01-31 DIAGNOSIS — Z818 Family history of other mental and behavioral disorders: Secondary | ICD-10-CM | POA: Diagnosis not present

## 2021-01-31 DIAGNOSIS — Z87440 Personal history of urinary (tract) infections: Secondary | ICD-10-CM | POA: Diagnosis not present

## 2021-01-31 DIAGNOSIS — D45 Polycythemia vera: Secondary | ICD-10-CM | POA: Insufficient documentation

## 2021-01-31 DIAGNOSIS — R531 Weakness: Secondary | ICD-10-CM | POA: Insufficient documentation

## 2021-01-31 DIAGNOSIS — R4189 Other symptoms and signs involving cognitive functions and awareness: Secondary | ICD-10-CM | POA: Diagnosis not present

## 2021-01-31 DIAGNOSIS — D631 Anemia in chronic kidney disease: Secondary | ICD-10-CM | POA: Insufficient documentation

## 2021-01-31 DIAGNOSIS — F0390 Unspecified dementia without behavioral disturbance: Secondary | ICD-10-CM | POA: Diagnosis not present

## 2021-01-31 DIAGNOSIS — Z803 Family history of malignant neoplasm of breast: Secondary | ICD-10-CM | POA: Diagnosis not present

## 2021-01-31 LAB — COMPREHENSIVE METABOLIC PANEL
ALT: 15 U/L (ref 0–44)
AST: 20 U/L (ref 15–41)
Albumin: 4.3 g/dL (ref 3.5–5.0)
Alkaline Phosphatase: 75 U/L (ref 38–126)
Anion gap: 8 (ref 5–15)
BUN: 30 mg/dL — ABNORMAL HIGH (ref 8–23)
CO2: 26 mmol/L (ref 22–32)
Calcium: 9 mg/dL (ref 8.9–10.3)
Chloride: 101 mmol/L (ref 98–111)
Creatinine, Ser: 1.32 mg/dL — ABNORMAL HIGH (ref 0.44–1.00)
GFR, Estimated: 38 mL/min — ABNORMAL LOW (ref >60)
Glucose, Bld: 113 mg/dL — ABNORMAL HIGH (ref 70–99)
Potassium: 4.2 mmol/L (ref 3.5–5.1)
Sodium: 135 mmol/L (ref 135–145)
Total Bilirubin: 0.4 mg/dL (ref 0.3–1.2)
Total Protein: 7.2 g/dL (ref 6.5–8.1)

## 2021-01-31 LAB — CBC WITH DIFFERENTIAL/PLATELET
Abs Immature Granulocytes: 0.39 10*3/uL — ABNORMAL HIGH (ref 0.00–0.07)
Basophils Absolute: 0.1 10*3/uL (ref 0.0–0.1)
Basophils Relative: 2 %
Eosinophils Absolute: 0.3 10*3/uL (ref 0.0–0.5)
Eosinophils Relative: 3 %
HCT: 33.1 % — ABNORMAL LOW (ref 36.0–46.0)
Hemoglobin: 10.1 g/dL — ABNORMAL LOW (ref 12.0–15.0)
Immature Granulocytes: 5 %
Lymphocytes Relative: 23 %
Lymphs Abs: 1.9 10*3/uL (ref 0.7–4.0)
MCH: 29 pg (ref 26.0–34.0)
MCHC: 30.5 g/dL (ref 30.0–36.0)
MCV: 95.1 fL (ref 80.0–100.0)
Monocytes Absolute: 0.8 10*3/uL (ref 0.1–1.0)
Monocytes Relative: 10 %
Neutro Abs: 4.7 10*3/uL (ref 1.7–7.7)
Neutrophils Relative %: 57 %
Platelets: 496 10*3/uL — ABNORMAL HIGH (ref 150–400)
RBC: 3.48 MIL/uL — ABNORMAL LOW (ref 3.87–5.11)
RDW: 22.8 % — ABNORMAL HIGH (ref 11.5–15.5)
WBC: 8.2 10*3/uL (ref 4.0–10.5)
nRBC: 0.2 % (ref 0.0–0.2)

## 2021-02-01 ENCOUNTER — Other Ambulatory Visit: Payer: Self-pay | Admitting: Oncology

## 2021-02-01 ENCOUNTER — Other Ambulatory Visit (HOSPITAL_COMMUNITY): Payer: Self-pay

## 2021-02-01 DIAGNOSIS — D7581 Myelofibrosis: Secondary | ICD-10-CM

## 2021-02-02 ENCOUNTER — Other Ambulatory Visit: Payer: Self-pay | Admitting: Oncology

## 2021-02-02 ENCOUNTER — Other Ambulatory Visit (HOSPITAL_COMMUNITY): Payer: Self-pay

## 2021-02-02 DIAGNOSIS — D7581 Myelofibrosis: Secondary | ICD-10-CM

## 2021-02-02 MED ORDER — RUXOLITINIB PHOSPHATE 10 MG PO TABS
10.0000 mg | ORAL_TABLET | Freq: Two times a day (BID) | ORAL | 0 refills | Status: DC
  Filled 2021-02-02: qty 60, 30d supply, fill #0

## 2021-02-13 ENCOUNTER — Inpatient Hospital Stay: Payer: Medicare Other

## 2021-02-14 ENCOUNTER — Other Ambulatory Visit: Payer: Self-pay

## 2021-02-14 ENCOUNTER — Inpatient Hospital Stay: Payer: Medicare Other

## 2021-02-14 DIAGNOSIS — Z23 Encounter for immunization: Secondary | ICD-10-CM | POA: Diagnosis not present

## 2021-02-14 DIAGNOSIS — D631 Anemia in chronic kidney disease: Secondary | ICD-10-CM | POA: Diagnosis not present

## 2021-02-14 DIAGNOSIS — D471 Chronic myeloproliferative disease: Secondary | ICD-10-CM | POA: Diagnosis not present

## 2021-02-14 DIAGNOSIS — D709 Neutropenia, unspecified: Secondary | ICD-10-CM | POA: Diagnosis not present

## 2021-02-14 DIAGNOSIS — D45 Polycythemia vera: Secondary | ICD-10-CM | POA: Diagnosis not present

## 2021-02-14 DIAGNOSIS — N1832 Chronic kidney disease, stage 3b: Secondary | ICD-10-CM | POA: Diagnosis not present

## 2021-02-14 LAB — CBC WITH DIFFERENTIAL/PLATELET
Abs Immature Granulocytes: 0.4 10*3/uL — ABNORMAL HIGH (ref 0.00–0.07)
Basophils Absolute: 0.1 10*3/uL (ref 0.0–0.1)
Basophils Relative: 1 %
Eosinophils Absolute: 0.3 10*3/uL (ref 0.0–0.5)
Eosinophils Relative: 3 %
HCT: 33.8 % — ABNORMAL LOW (ref 36.0–46.0)
Hemoglobin: 10.5 g/dL — ABNORMAL LOW (ref 12.0–15.0)
Immature Granulocytes: 4 %
Lymphocytes Relative: 18 %
Lymphs Abs: 1.6 10*3/uL (ref 0.7–4.0)
MCH: 29.2 pg (ref 26.0–34.0)
MCHC: 31.1 g/dL (ref 30.0–36.0)
MCV: 94.2 fL (ref 80.0–100.0)
Monocytes Absolute: 0.8 10*3/uL (ref 0.1–1.0)
Monocytes Relative: 9 %
Neutro Abs: 6.1 10*3/uL (ref 1.7–7.7)
Neutrophils Relative %: 65 %
Platelets: 518 10*3/uL — ABNORMAL HIGH (ref 150–400)
RBC: 3.59 MIL/uL — ABNORMAL LOW (ref 3.87–5.11)
RDW: 22.7 % — ABNORMAL HIGH (ref 11.5–15.5)
WBC: 9.3 10*3/uL (ref 4.0–10.5)
nRBC: 0 % (ref 0.0–0.2)

## 2021-02-14 LAB — COMPREHENSIVE METABOLIC PANEL
ALT: 13 U/L (ref 0–44)
AST: 19 U/L (ref 15–41)
Albumin: 4.4 g/dL (ref 3.5–5.0)
Alkaline Phosphatase: 68 U/L (ref 38–126)
Anion gap: 12 (ref 5–15)
BUN: 33 mg/dL — ABNORMAL HIGH (ref 8–23)
CO2: 25 mmol/L (ref 22–32)
Calcium: 8.9 mg/dL (ref 8.9–10.3)
Chloride: 97 mmol/L — ABNORMAL LOW (ref 98–111)
Creatinine, Ser: 1.59 mg/dL — ABNORMAL HIGH (ref 0.44–1.00)
GFR, Estimated: 30 mL/min — ABNORMAL LOW (ref >60)
Glucose, Bld: 188 mg/dL — ABNORMAL HIGH (ref 70–99)
Potassium: 4.6 mmol/L (ref 3.5–5.1)
Sodium: 134 mmol/L — ABNORMAL LOW (ref 135–145)
Total Bilirubin: 0.2 mg/dL — ABNORMAL LOW (ref 0.3–1.2)
Total Protein: 7.2 g/dL (ref 6.5–8.1)

## 2021-02-27 ENCOUNTER — Ambulatory Visit: Payer: PRIVATE HEALTH INSURANCE

## 2021-02-27 ENCOUNTER — Other Ambulatory Visit: Payer: PRIVATE HEALTH INSURANCE

## 2021-02-27 ENCOUNTER — Ambulatory Visit: Payer: PRIVATE HEALTH INSURANCE | Admitting: Oncology

## 2021-02-27 ENCOUNTER — Other Ambulatory Visit (HOSPITAL_COMMUNITY): Payer: Self-pay

## 2021-02-28 ENCOUNTER — Inpatient Hospital Stay: Payer: Medicare Other

## 2021-02-28 ENCOUNTER — Other Ambulatory Visit: Payer: Self-pay

## 2021-02-28 ENCOUNTER — Encounter: Payer: Self-pay | Admitting: Oncology

## 2021-02-28 ENCOUNTER — Inpatient Hospital Stay (HOSPITAL_BASED_OUTPATIENT_CLINIC_OR_DEPARTMENT_OTHER): Payer: Medicare Other | Admitting: Oncology

## 2021-02-28 VITALS — BP 128/76 | HR 85 | Temp 97.5°F | Resp 18 | Wt 110.7 lb

## 2021-02-28 DIAGNOSIS — N1832 Chronic kidney disease, stage 3b: Secondary | ICD-10-CM | POA: Diagnosis not present

## 2021-02-28 DIAGNOSIS — D471 Chronic myeloproliferative disease: Secondary | ICD-10-CM | POA: Diagnosis not present

## 2021-02-28 DIAGNOSIS — Z23 Encounter for immunization: Secondary | ICD-10-CM

## 2021-02-28 DIAGNOSIS — D7581 Myelofibrosis: Secondary | ICD-10-CM | POA: Diagnosis not present

## 2021-02-28 DIAGNOSIS — D631 Anemia in chronic kidney disease: Secondary | ICD-10-CM | POA: Diagnosis not present

## 2021-02-28 DIAGNOSIS — D709 Neutropenia, unspecified: Secondary | ICD-10-CM | POA: Diagnosis not present

## 2021-02-28 DIAGNOSIS — D45 Polycythemia vera: Secondary | ICD-10-CM

## 2021-02-28 DIAGNOSIS — R634 Abnormal weight loss: Secondary | ICD-10-CM

## 2021-02-28 LAB — CBC WITH DIFFERENTIAL/PLATELET
Abs Immature Granulocytes: 0.58 10*3/uL — ABNORMAL HIGH (ref 0.00–0.07)
Basophils Absolute: 0.1 10*3/uL (ref 0.0–0.1)
Basophils Relative: 2 %
Eosinophils Absolute: 0.3 10*3/uL (ref 0.0–0.5)
Eosinophils Relative: 3 %
HCT: 33.3 % — ABNORMAL LOW (ref 36.0–46.0)
Hemoglobin: 10.2 g/dL — ABNORMAL LOW (ref 12.0–15.0)
Immature Granulocytes: 7 %
Lymphocytes Relative: 21 %
Lymphs Abs: 1.9 10*3/uL (ref 0.7–4.0)
MCH: 28.8 pg (ref 26.0–34.0)
MCHC: 30.6 g/dL (ref 30.0–36.0)
MCV: 94.1 fL (ref 80.0–100.0)
Monocytes Absolute: 0.7 10*3/uL (ref 0.1–1.0)
Monocytes Relative: 8 %
Neutro Abs: 5.2 10*3/uL (ref 1.7–7.7)
Neutrophils Relative %: 59 %
Platelets: 546 10*3/uL — ABNORMAL HIGH (ref 150–400)
RBC: 3.54 MIL/uL — ABNORMAL LOW (ref 3.87–5.11)
RDW: 22.9 % — ABNORMAL HIGH (ref 11.5–15.5)
Smear Review: NORMAL
WBC: 8.8 10*3/uL (ref 4.0–10.5)
nRBC: 0.3 % — ABNORMAL HIGH (ref 0.0–0.2)

## 2021-02-28 LAB — COMPREHENSIVE METABOLIC PANEL
ALT: 14 U/L (ref 0–44)
AST: 19 U/L (ref 15–41)
Albumin: 4.3 g/dL (ref 3.5–5.0)
Alkaline Phosphatase: 81 U/L (ref 38–126)
Anion gap: 12 (ref 5–15)
BUN: 35 mg/dL — ABNORMAL HIGH (ref 8–23)
CO2: 24 mmol/L (ref 22–32)
Calcium: 9.4 mg/dL (ref 8.9–10.3)
Chloride: 102 mmol/L (ref 98–111)
Creatinine, Ser: 1.38 mg/dL — ABNORMAL HIGH (ref 0.44–1.00)
GFR, Estimated: 36 mL/min — ABNORMAL LOW (ref >60)
Glucose, Bld: 164 mg/dL — ABNORMAL HIGH (ref 70–99)
Potassium: 4.1 mmol/L (ref 3.5–5.1)
Sodium: 138 mmol/L (ref 135–145)
Total Bilirubin: 0.2 mg/dL — ABNORMAL LOW (ref 0.3–1.2)
Total Protein: 7.4 g/dL (ref 6.5–8.1)

## 2021-02-28 MED ORDER — INFLUENZA VAC A&B SA ADJ QUAD 0.5 ML IM PRSY
0.5000 mL | PREFILLED_SYRINGE | Freq: Once | INTRAMUSCULAR | Status: AC
  Administered 2021-02-28: 0.5 mL via INTRAMUSCULAR
  Filled 2021-02-28: qty 0.5

## 2021-02-28 NOTE — Progress Notes (Signed)
Pt here for follow up. No new concerns voiced.   

## 2021-02-28 NOTE — Progress Notes (Signed)
Hematology Oncology Progress Note   Clinic Day:  02/28/2021   Referring physician: Earlie Server, MD  Chief Complaint: Laura Wilcox is a 87 y.o. female with polycythemia vera (PV) presents for follow up   [place] is a 87 y.o.afemale who has above oncology history reviewed by me today presented for follow up visit for management of  polycythemia vera  Patient previously followed up by Dr.Corcoran, patient switched care to me on 11/16/20 Extensive medical record review was performed by me  Feb 2014 polycythemia vera and secondary myelofibrosis.  She presented in 04/2002 with thrombocytosis (835,000).  JAK2 V617F was positive   Bone marrow was on 04/15/2002 revealed a normocellular marrow for age with trilineage hematopoiesis and mild megakaryocytic hyperplasia with focal atypia.  There was no morphologic evidence of a myeloproliferative disorder.  Iron stains were inadequate for evaluation of storage iron.  There was no increase in reticulin fibers.  She was started on Agrylin in 2012  Bone marrow on 09/25/2012 revealed persistent myeloproliferative disorder s/p Agrylin.  The current features were best classified as persistent myeloproliferative neoplasm (MPN) JAK2 V617F mutation positive.  The current features of markedly increased atypical megakaryocytes paresis, with frequent clustering with frequent hyper/deeply lobulated forms, bizarre forms and forms with staghorn nuclei, hyperchromatic forms and abundant cytoplasm was highly suggestive of a myeloproliferative neoplasm.  Marrow was hypercellular for age (30-50%) with increased trilineage hematopoiesis and markedly increased atypical megakaryopoiesis with frequent clustering.  There were no increased blasts.  Iron stores were absent.  There was moderate to severe myelofibrosis (grade MF 2-3).  Differential includes ET, PV, primary myelofibrosis, and MPN, unclassifiable.  Cytogenetics were normal (55, XX). She  developed pancytopenia in 12/2015.  Etiology is unclear (medication confusion or hydroxyurea + Agrylin).   She received Procrit 40,000 units (intermittently 09/2012 - 06/2016), Venofer (09/2012 - 10/2012), and Infed (12/2015 - 03/2016).  She received Neupogen during a period of neutropenia.   She was anagrelide from 2004 - 08/13/2017.  She began [place] on 08/14/2017.  Jakafi was increased from 5 mg BID to 10 mg BID on 06/17/2018.  Jakafi was increased to 15 mg BID on 08/03/2018.  She has been back on Jakafi 5 mg BID since 02/01/2019 then 10 mg BID on 05/25/2019.  [name] was on hold from 12/20/2019 - 01/12/2020.  Jakafi 5 mg twice daily restarted on 01/12/2020 then increased to 10 mg in the morning and 5 mg in the evening on 02/02/2020. [name] is currently 20 mg po BID.  She has received Retacrit to maintain a hemoglobin > 10 (12/24/2019 and 03   Squamous cell carcinoma left cheek- s/p cryotherapy and debridement. Managed by Dr. Nehemiah Massed  10/22/2020- 10/26/2020 Hospitalization due to acute metabolic encephalopathy, UTI,E. coli bacteremia AKI on CKD, acute on chronic anemia. [name] was held during the admission. She was discharged and spent a short period of time in rehab. [name] was held.   11/06/2020 seen by symptom management for weakness.  11/16/2020 established care with me. Resumed Jakafi at 10mg  BID  #Dementia/cognitive impairment- patient has hx of dementia. Per daughter, it has been difficult to convince patent to see neurology for further evaluation. She is not on any medication for her memory loss.  She has been referred to home health.   INTERVAL HISTORY Laura Wilcox is a 86 y.o. female who has above history reviewed by me today presents for follow up visit for management of polycythemia rubra vera and secondary myelofibrosis.   Patient was accompanied by  daughter [name]   Appetite is fair.  Weight has been stable She currently is on Jakafi 10 mg twice daily.  For the past week, daughter  reports that patient has missed a few doses.  No new complaints.   Past Medical History:  Diagnosis Date   Acute metabolic encephalopathy 0/93/2671   Confusion 12/30/2018   Polycythemia    Squamous cell carcinoma of skin 09/06/2020   R thumb - ED&C   Urinary tract infection without hematuria 01/02/2020   Urticaria 06/02/2017    Past Surgical History:  Procedure Laterality Date   ABDOMINAL HYSTERECTOMY     complete   BREAST SURGERY      Family History  Problem Relation Age of Onset   Heart disease Father    Cancer Sister        breast   Cancer Sister        breast   Alzheimer's disease Sister     Social History:  reports that she has never smoked. She has never used smokeless tobacco. She reports that she does not drink alcohol and does not use drugs. She is widowed.  Patient is retired Sport and exercise psychologist. Patient moved to [facility] in 03/2017.  She lives independently in her own apartment in [place]. Patient denies known exposures to radiation on toxins.  Pt's daughter, [name], can be reached at [phone].  She lives in [place]. The patient is accompanied by her daughter today.   Allergies:  Allergies  Allergen Reactions   Bactrim [Sulfamethoxazole-Trimethoprim]     Due to kidney disease     Current Medications: Current Outpatient Medications  Medication Sig Dispense Refill   acetaminophen (TYLENOL) 325 MG tablet Take 325-650 mg by mouth every 6 (six) hours as needed for mild pain or moderate pain.     aspirin EC 81 MG tablet Take 1 tablet (81 mg total) by mouth daily. Swallow whole. 30 tablet 0   calcitRIOL (ROCALTROL) 0.25 MCG capsule Take 0.25 mcg by mouth every Monday, Wednesday, and Friday.     fluorouracil (EFUDEX) 5 % cream Apply to lesion on the right lower leg BID x 1 week. Take a 3 week break then repeat treatment. Do this for 3 rounds. 15 g 2   ruxolitinib phosphate (JAKAFI) 10 MG tablet Take 1 tablet (10 mg total) by mouth 2 (two) times daily. 60 tablet 0   No current  facility-administered medications for this visit.    Review of Systems  Unable to perform ROS: Dementia   Performance status (ECOG):  2  Vitals Blood pressure 128/76, pulse 85, temperature (!) 97.5 F (36.4 C), resp. rate 18, weight 110 lb 11.2 oz (50.2 kg).   Physical Exam Vitals reviewed.  Constitutional:      General: She is not in acute distress.    Appearance: She is well-developed. She is not ill-appearing.  HENT:     Head: Normocephalic and atraumatic.     Nose: Nose normal. No congestion.     Mouth/Throat:     Pharynx: No oropharyngeal exudate.  Eyes:     General: No scleral icterus.    Conjunctiva/sclera: Conjunctivae normal.  Cardiovascular:     Rate and Rhythm: Normal rate and regular rhythm.     Pulses: Normal pulses.     Heart sounds: No murmur heard. Pulmonary:     Effort: Pulmonary effort is normal. No respiratory distress.  Abdominal:     General: There is no distension.     Tenderness: There is no abdominal tenderness. There is  no guarding.  Musculoskeletal:        General: No deformity. Normal range of motion.     Right lower leg: No edema.     Left lower leg: No edema.     Comments: Ambulates without aids  Skin:    General: Skin is warm and dry.     Coloration: Skin is not pale.  Neurological:     Mental Status: She is alert. Mental status is at baseline.     Motor: No weakness.  Psychiatric:        Attention and Perception: Attention normal.        Mood and Affect: Mood and affect normal.        Behavior: Behavior normal. Behavior is cooperative.        Cognition and Memory: Cognition is impaired. Memory is impaired.    Appointment on 02/28/2021  Component Date Value Ref Range Status   WBC 02/28/2021 8.8  4.0 - 10.5 K/uL Final   RBC 02/28/2021 3.54 (L)  3.87 - 5.11 MIL/uL Final   Hemoglobin 02/28/2021 10.2 (L)  12.0 - 15.0 g/dL Final   HCT 02/28/2021 33.3 (L)  36.0 - 46.0 % Final   MCV 02/28/2021 94.1  80.0 - 100.0 fL Final   MCH  02/28/2021 28.8  26.0 - 34.0 pg Final   MCHC 02/28/2021 30.6  30.0 - 36.0 g/dL Final   RDW 02/28/2021 22.9 (H)  11.5 - 15.5 % Final   Platelets 02/28/2021 546 (H)  150 - 400 K/uL Final   nRBC 02/28/2021 0.3 (H)  0.0 - 0.2 % Final   Neutrophils Relative % 02/28/2021 59  % Final   Neutro Abs 02/28/2021 5.2  1.7 - 7.7 K/uL Final   Lymphocytes Relative 02/28/2021 21  % Final   Lymphs Abs 02/28/2021 1.9  0.7 - 4.0 K/uL Final   Monocytes Relative 02/28/2021 8  % Final   Monocytes Absolute 02/28/2021 0.7  0.1 - 1.0 K/uL Final   Eosinophils Relative 02/28/2021 3  % Final   Eosinophils Absolute 02/28/2021 0.3  0.0 - 0.5 K/uL Final   Basophils Relative 02/28/2021 2  % Final   Basophils Absolute 02/28/2021 0.1  0.0 - 0.1 K/uL Final   WBC Morphology 02/28/2021 MODERATE LEFT SHIFT (>5% METAS AND MYELOS,OCC PRO NOTED)   Final   DIFF. CONFIRMED BY SMEAR   RBC Morphology 02/28/2021 MORPHOLOGY UNREMARKABLE   Final   Smear Review 02/28/2021 Normal platelet morphology   Final   PLATELETS APPEAR INCREASED   Immature Granulocytes 02/28/2021 7  % Final   Abs Immature Granulocytes 02/28/2021 0.58 (H)  0.00 - 0.07 K/uL Final   Performed at [facility], [place], [place] [address]   Sodium 02/28/2021 138  135 - 145 mmol/L Final   Potassium 02/28/2021 4.1  3.5 - 5.1 mmol/L Final   Chloride 02/28/2021 102  98 - 111 mmol/L Final   CO2 02/28/2021 24  22 - 32 mmol/L Final   Glucose, Bld 02/28/2021 164 (H)  70 - 99 mg/dL Final   Glucose reference range applies only to samples taken after fasting for at least 8 hours.   BUN 02/28/2021 35 (H)  8 - 23 mg/dL Final   Creatinine, Ser 02/28/2021 1.38 (H)  0.44 - 1.00 mg/dL Final   Calcium 02/28/2021 9.4  8.9 - 10.3 mg/dL Final   Total Protein 02/28/2021 7.4  6.5 - 8.1 g/dL Final   Albumin 02/28/2021 4.3  3.5 - 5.0 g/dL Final  AST 02/28/2021 19  15 - 41 U/L Final   ALT 02/28/2021 14  0 - 44 U/L Final   Alkaline Phosphatase 02/28/2021 81  38 -  126 U/L Final   Total Bilirubin 02/28/2021 0.2 (L)  0.3 - 1.2 mg/dL Final   GFR, Estimated 02/28/2021 36 (L)  >60 mL/min Final   Comment: (NOTE) Calculated using the CKD-EPI Creatinine Equation (2021)    Anion gap 02/28/2021 12  5 - 15 Final   Performed at [facility], [place], [place] [address]   [facility]     Component Value Date/Time   NA 138 02/28/2021 1406   K 4.1 02/28/2021 1406   CL 102 02/28/2021 1406   CO2 24 02/28/2021 1406   GLUCOSE 164 (H) 02/28/2021 1406   BUN 35 (H) 02/28/2021 1406   CREATININE 1.38 (H) 02/28/2021 1406   CALCIUM 9.4 02/28/2021 1406   PROT 7.4 02/28/2021 1406   ALBUMIN 4.3 02/28/2021 1406   AST 19 02/28/2021 1406   ALT 14 02/28/2021 1406   ALKPHOS 81 02/28/2021 1406   BILITOT 0.2 (L) 02/28/2021 1406   GFRNONAA 36 (L) 02/28/2021 1406   GFRAA 49 (L) 12/27/2019 0935    Assessment and Plan.  1. Myelofibrosis ([place])   2. Anemia in stage 3b chronic kidney disease ([place])   3. Weight loss   4. Need for prophylactic vaccination and inoculation against influenza    # Polycythemia vera and  secondary myelofibrosis. Labs are reviewed and discussed with patient and her daughter. Platelet count has increased to 500s. Is likely due to recent missed doses.   Continue Jakafi 10 mg twice daily.   # Anemia due to CKD, and myelofibrosis.  She has been on low dose retacrit 10000 units every 2 weeks if hemoglobin is less than 10. She has not needed for the past 3 visits. Today hemoglobin is above 10.  Hold Retacrit.  #Weight loss, appetite has improved.  Weight has been stable.  Monitor.  #Discussed about influenza vaccination recommendation.  Patient and daughter agree with the plan.  Patient will receive influenza vaccination today.  Follow up in   Lab CBC CMP +/- Retacrit in 4 weeks lab MD 8 weeks +/- Retacrit.   Laura Server, MD, PhD 02/28/2021

## 2021-03-01 ENCOUNTER — Other Ambulatory Visit (HOSPITAL_COMMUNITY): Payer: Self-pay

## 2021-03-01 ENCOUNTER — Other Ambulatory Visit: Payer: Self-pay | Admitting: Oncology

## 2021-03-01 DIAGNOSIS — D7581 Myelofibrosis: Secondary | ICD-10-CM

## 2021-03-01 MED ORDER — RUXOLITINIB PHOSPHATE 10 MG PO TABS
10.0000 mg | ORAL_TABLET | Freq: Two times a day (BID) | ORAL | 2 refills | Status: DC
  Filled 2021-03-01: qty 60, 30d supply, fill #0
  Filled 2021-04-05: qty 60, 30d supply, fill #1

## 2021-03-12 ENCOUNTER — Other Ambulatory Visit (HOSPITAL_COMMUNITY): Payer: Self-pay

## 2021-03-28 ENCOUNTER — Other Ambulatory Visit: Payer: Self-pay

## 2021-03-28 ENCOUNTER — Inpatient Hospital Stay: Payer: Medicare Other | Attending: Oncology

## 2021-03-28 ENCOUNTER — Inpatient Hospital Stay: Payer: Medicare Other

## 2021-03-28 VITALS — BP 114/67 | HR 85

## 2021-03-28 DIAGNOSIS — D45 Polycythemia vera: Secondary | ICD-10-CM | POA: Diagnosis not present

## 2021-03-28 DIAGNOSIS — Z79899 Other long term (current) drug therapy: Secondary | ICD-10-CM | POA: Diagnosis not present

## 2021-03-28 DIAGNOSIS — N1832 Chronic kidney disease, stage 3b: Secondary | ICD-10-CM | POA: Insufficient documentation

## 2021-03-28 DIAGNOSIS — D631 Anemia in chronic kidney disease: Secondary | ICD-10-CM | POA: Diagnosis not present

## 2021-03-28 DIAGNOSIS — D7581 Myelofibrosis: Secondary | ICD-10-CM | POA: Insufficient documentation

## 2021-03-28 DIAGNOSIS — R634 Abnormal weight loss: Secondary | ICD-10-CM | POA: Insufficient documentation

## 2021-03-28 LAB — COMPREHENSIVE METABOLIC PANEL
ALT: 15 U/L (ref 0–44)
AST: 22 U/L (ref 15–41)
Albumin: 4 g/dL (ref 3.5–5.0)
Alkaline Phosphatase: 62 U/L (ref 38–126)
Anion gap: 10 (ref 5–15)
BUN: 31 mg/dL — ABNORMAL HIGH (ref 8–23)
CO2: 25 mmol/L (ref 22–32)
Calcium: 9.6 mg/dL (ref 8.9–10.3)
Chloride: 104 mmol/L (ref 98–111)
Creatinine, Ser: 1.21 mg/dL — ABNORMAL HIGH (ref 0.44–1.00)
GFR, Estimated: 42 mL/min — ABNORMAL LOW (ref >60)
Glucose, Bld: 148 mg/dL — ABNORMAL HIGH (ref 70–99)
Potassium: 4.2 mmol/L (ref 3.5–5.1)
Sodium: 139 mmol/L (ref 135–145)
Total Bilirubin: 0.3 mg/dL (ref 0.3–1.2)
Total Protein: 7 g/dL (ref 6.5–8.1)

## 2021-03-28 LAB — CBC WITH DIFFERENTIAL/PLATELET
Abs Immature Granulocytes: 0.51 10*3/uL — ABNORMAL HIGH (ref 0.00–0.07)
Basophils Absolute: 0.2 10*3/uL — ABNORMAL HIGH (ref 0.0–0.1)
Basophils Relative: 2 %
Eosinophils Absolute: 0.3 10*3/uL (ref 0.0–0.5)
Eosinophils Relative: 4 %
HCT: 31.7 % — ABNORMAL LOW (ref 36.0–46.0)
Hemoglobin: 9.9 g/dL — ABNORMAL LOW (ref 12.0–15.0)
Immature Granulocytes: 6 %
Lymphocytes Relative: 20 %
Lymphs Abs: 1.8 10*3/uL (ref 0.7–4.0)
MCH: 29.1 pg (ref 26.0–34.0)
MCHC: 31.2 g/dL (ref 30.0–36.0)
MCV: 93.2 fL (ref 80.0–100.0)
Monocytes Absolute: 0.8 10*3/uL (ref 0.1–1.0)
Monocytes Relative: 9 %
Neutro Abs: 5.3 10*3/uL (ref 1.7–7.7)
Neutrophils Relative %: 59 %
Platelets: 494 10*3/uL — ABNORMAL HIGH (ref 150–400)
RBC: 3.4 MIL/uL — ABNORMAL LOW (ref 3.87–5.11)
RDW: 23 % — ABNORMAL HIGH (ref 11.5–15.5)
Smear Review: INCREASED
WBC: 8.9 10*3/uL (ref 4.0–10.5)
nRBC: 0.3 % — ABNORMAL HIGH (ref 0.0–0.2)

## 2021-03-28 MED ORDER — EPOETIN ALFA-EPBX 10000 UNIT/ML IJ SOLN
10000.0000 [IU] | Freq: Once | INTRAMUSCULAR | Status: AC
  Administered 2021-03-28: 14:00:00 10000 [IU] via SUBCUTANEOUS
  Filled 2021-03-28: qty 1

## 2021-03-30 ENCOUNTER — Ambulatory Visit (INDEPENDENT_AMBULATORY_CARE_PROVIDER_SITE_OTHER): Payer: Medicare Other | Admitting: Internal Medicine

## 2021-03-30 ENCOUNTER — Encounter: Payer: Self-pay | Admitting: Internal Medicine

## 2021-03-30 ENCOUNTER — Other Ambulatory Visit: Payer: Self-pay

## 2021-03-30 VITALS — BP 124/70 | HR 73 | Ht 67.0 in | Wt 110.0 lb

## 2021-03-30 DIAGNOSIS — R5383 Other fatigue: Secondary | ICD-10-CM | POA: Diagnosis not present

## 2021-03-30 DIAGNOSIS — R410 Disorientation, unspecified: Secondary | ICD-10-CM | POA: Diagnosis not present

## 2021-03-30 LAB — POCT URINALYSIS DIPSTICK
Bilirubin, UA: NEGATIVE
Blood, UA: NEGATIVE
Glucose, UA: NEGATIVE
Ketones, UA: NEGATIVE
Leukocytes, UA: NEGATIVE
Nitrite, UA: NEGATIVE
Protein, UA: NEGATIVE
Spec Grav, UA: 1.01 (ref 1.010–1.025)
Urobilinogen, UA: 0.2 E.U./dL
pH, UA: 5 (ref 5.0–8.0)

## 2021-03-30 NOTE — Progress Notes (Signed)
Date:  03/30/2021   Name:  Laura Wilcox   DOB:  05-17-1926   MRN:  [id number]  Here today with her daughter [name]. Chief Complaint: Urinary Tract Infection  Confusion: Patient is here today with her daughter [name]. [name] stated when patient gets a UTI she does not have the normal dysuria, and frequency but she does become disoriented. She wanted to have patient checked for a UTI before the weekend comes. Her daughter has also noted increased sleeping and disinterest in shopping and other excursions.  She eats well most meals.  Weight is stable. Lab done 2 days ago were normal.  Lab Results  Component Value Date   NA 139 03/28/2021   K 4.2 03/28/2021   CO2 25 03/28/2021   GLUCOSE 148 (H) 03/28/2021   BUN 31 (H) 03/28/2021   CREATININE 1.21 (H) 03/28/2021   CALCIUM 9.6 03/28/2021   GFRNONAA 42 (L) 03/28/2021   Lab Results  Component Value Date   CHOL 279 (H) 05/27/2018   HDL 72 05/27/2018   LDLCALC 182 (H) 05/27/2018   TRIG 125 05/27/2018   CHOLHDL 3.9 05/27/2018   Lab Results  Component Value Date   TSH 3.297 11/16/2020   No results found for: HGBA1C Lab Results  Component Value Date   WBC 8.9 03/28/2021   HGB 9.9 (L) 03/28/2021   HCT 31.7 (L) 03/28/2021   MCV 93.2 03/28/2021   PLT 494 (H) 03/28/2021   Lab Results  Component Value Date   ALT 15 03/28/2021   AST 22 03/28/2021   ALKPHOS 62 03/28/2021   BILITOT 0.3 03/28/2021   No results found for: 25OHVITD2, 25OHVITD3, VD25OH   Review of Systems  Constitutional:  Positive for fatigue. Negative for appetite change, diaphoresis, fever and unexpected weight change.  HENT:  Positive for hearing loss.   Respiratory:  Negative for cough, chest tightness and shortness of breath.   Cardiovascular:  Negative for chest pain and leg swelling.  Genitourinary:  Negative for dysuria.  Skin:  Positive for wound (on left cheek).  Neurological:  Negative for syncope, weakness, light-headedness and headaches.   Psychiatric/Behavioral:  Positive for confusion. Negative for dysphoric mood and sleep disturbance. The patient is not nervous/anxious.    Patient Active Problem List   Diagnosis Date Noted   Renal insufficiency 03/14/2020   Dehydration 03/12/2020   Myelofibrosis ([place]) 01/31/2020   Other pancytopenia ([place]) 01/31/2020   Squamous cell cancer of skin of left cheek 01/16/2020   Chronic kidney disease, stage 3b ([place]) 03/11/2019   Secondary hyperparathyroidism of renal origin ([place]) 03/11/2019   Anemia in chronic kidney disease 03/11/2019   Generalized weakness 12/17/2018   Elevated ferritin 02/18/2018   Goals of care, counseling/discussion 07/09/2017   Polycythemia rubra vera ([place]) 06/02/2017   Underweight 06/02/2017   Advanced care planning/counseling discussion 06/02/2017   Cognitive impairment 06/02/2017    Allergies  Allergen Reactions   Bactrim [Sulfamethoxazole-Trimethoprim]     Due to kidney disease     Past Surgical History:  Procedure Laterality Date   ABDOMINAL HYSTERECTOMY     complete   BREAST SURGERY      Social History   Tobacco Use   Smoking status: Never   Smokeless tobacco: Never  Vaping Use   Vaping Use: Never used  Substance Use Topics   Alcohol use: No    Comment: occasional glass of wine   Drug use: No     Medication list has been reviewed and updated.  Current Meds  Medication Sig   acetaminophen (TYLENOL) 325 MG tablet Take 325-650 mg by mouth every 6 (six) hours as needed for mild pain or moderate pain.   aspirin EC 81 MG tablet Take 1 tablet (81 mg total) by mouth daily. Swallow whole.   calcitRIOL (ROCALTROL) 0.25 MCG capsule Take 0.25 mcg by mouth every Monday, Wednesday, and Friday.   fluorouracil (EFUDEX) 5 % cream Apply to lesion on the right lower leg BID x 1 week. Take a 3 week break then repeat treatment. Do this for 3 rounds.   ruxolitinib phosphate (JAKAFI) 10 MG tablet Take 1 tablet (10 mg total) by mouth 2 (two) times daily.     PHQ 2/9 Scores 03/30/2021 09/19/2020 07/04/2020 03/28/2020  PHQ - 2 Score 0 0 0 0  PHQ- 9 Score 3 0 0 0    GAD 7 : Generalized Anxiety Score 03/30/2021 09/19/2020 07/04/2020 03/28/2020  Nervous, Anxious, on Edge 0 0 0 0  Control/stop worrying 0 0 0 0  Worry too much - different things 0 0 0 0  Trouble relaxing 0 0 0 0  Restless 0 0 0 0  Easily annoyed or irritable 0 0 0 0  Afraid - awful might happen 0 0 0 0  Total GAD 7 Score 0 0 0 0  Anxiety Difficulty Not difficult at all - - Not difficult at all    BP Readings from Last 3 Encounters:  03/30/21 124/70  03/28/21 114/67  02/28/21 128/76    Physical Exam Vitals and nursing note reviewed.  Constitutional:      General: She is not in acute distress.    Appearance: She is well-developed.  HENT:     Head: Normocephalic and atraumatic.  Cardiovascular:     Rate and Rhythm: Normal rate and regular rhythm.  Pulmonary:     Effort: Pulmonary effort is normal. No respiratory distress.     Breath sounds: No wheezing or rhonchi.  Abdominal:     General: Abdomen is flat.     Palpations: Abdomen is soft.     Tenderness: There is no abdominal tenderness.  Musculoskeletal:     Cervical back: Normal range of motion.     Right lower leg: No edema.     Left lower leg: No edema.  Lymphadenopathy:     Cervical: No cervical adenopathy.  Skin:    General: Skin is warm and dry.     Findings: Rash present.     Comments: Keratoses and warty lesions on arms and legs Large lesion of left cheek being treated by Dermatology  Neurological:     Mental Status: She is alert.     Motor: Motor function is intact.     Gait: Gait is intact.  Psychiatric:        Mood and Affect: Mood normal.        Behavior: Behavior normal.    Wt Readings from Last 3 Encounters:  03/30/21 110 lb (49.9 kg)  02/28/21 110 lb 11.2 oz (50.2 kg)  01/02/21 111 lb 6.4 oz (50.5 kg)    BP 124/70    Pulse 73    Ht 5\' 7"  (1.702 m)    Wt 110 lb (49.9 kg)    SpO2 97%     BMI 17.23 kg/m   Assessment and Plan: 1. Confusion and disorientation Appears alert and interactive - HOH unchanged. Weight is stable.   UA is negative.   Continue hydration, healthy diet with close family monitoring - POCT Urinalysis Dipstick  2. Fatigue, unspecified type Likely due to age of 32. Recent labs stable.   Partially dictated using Editor, commissioning. Any errors are unintentional.  Halina Maidens, MD [facility]  03/30/2021

## 2021-04-05 ENCOUNTER — Other Ambulatory Visit (HOSPITAL_COMMUNITY): Payer: Self-pay

## 2021-04-11 ENCOUNTER — Ambulatory Visit (INDEPENDENT_AMBULATORY_CARE_PROVIDER_SITE_OTHER): Payer: Medicare Other | Admitting: Dermatology

## 2021-04-11 ENCOUNTER — Other Ambulatory Visit: Payer: Self-pay

## 2021-04-11 DIAGNOSIS — D485 Neoplasm of uncertain behavior of skin: Secondary | ICD-10-CM

## 2021-04-11 DIAGNOSIS — L578 Other skin changes due to chronic exposure to nonionizing radiation: Secondary | ICD-10-CM | POA: Diagnosis not present

## 2021-04-11 DIAGNOSIS — D0461 Carcinoma in situ of skin of right upper limb, including shoulder: Secondary | ICD-10-CM

## 2021-04-11 DIAGNOSIS — D492 Neoplasm of unspecified behavior of bone, soft tissue, and skin: Secondary | ICD-10-CM

## 2021-04-11 DIAGNOSIS — C4492 Squamous cell carcinoma of skin, unspecified: Secondary | ICD-10-CM

## 2021-04-11 HISTORY — DX: Squamous cell carcinoma of skin, unspecified: C44.92

## 2021-04-11 MED ORDER — FLUOROURACIL 5 % EX CREA: TOPICAL_CREAM | Freq: Two times a day (BID) | CUTANEOUS | 1 refills | Status: DC

## 2021-04-11 MED ORDER — MUPIROCIN 2 % EX OINT: 1.0000 "application " | TOPICAL_OINTMENT | Freq: Every day | CUTANEOUS | 1 refills | Status: DC

## 2021-04-11 NOTE — Progress Notes (Signed)
Follow-Up Visit   Subjective  Laura Wilcox is a 88 y.o. female who presents for the following: Neoplasm of uncertain behavior c/w SCC IS (R forearm, L forearm, L cheek, R med dorsum hand, all txted with LN2/R ankle txted w/ 5FU/Calcipotriene bid x 1 wk x 3 rounds).  Patient accompanied by daughter who contributes to history.  The patient has spots, moles and lesions to be evaluated, some may be new or changing and the patient has concerns that these could be cancer.  The following portions of the chart were reviewed this encounter and updated as appropriate:   Tobacco   Allergies   Meds   Problems   Med Hx   Surg Hx   Fam Hx      Review of Systems:  No other skin or systemic complaints except as noted in HPI or Assessment and Plan.  Objective  Well appearing patient in no apparent distress; mood and affect are within normal limits.  A focused examination was performed including face, arms, hands, right ankle. Relevant physical exam findings are noted in the Assessment and Plan.  R med dorsum hand Keratotic pap 1.6cm     L cheek Crusted patches L medial cheek, large plaque L lat cheek        Assessment & Plan   Actinic Damage - Severe, confluent actinic changes with pre-cancerous actinic keratoses  - Severe, chronic, not at goal, secondary to cumulative UV radiation exposure over time - diffuse scaly erythematous macules and papules with underlying dyspigmentation - Discussed Prescription "Field Treatment" for Severe, Chronic Confluent Actinic Changes with Pre-Cancerous Actinic Keratoses and biopsy proven Squamous Cell Carcinoma in situ lesions - multiple - many treated in past - some today may be recurrent. Field treatment involves treatment of an entire area of skin that has confluent Actinic Changes (Sun/ Ultraviolet light damage) and PreCancerous Actinic Keratoses by method of PhotoDynamic Therapy (PDT) and/or prescription Topical Chemotherapy agents such as  5-fluorouracil, 5-fluorouracil/calcipotriene, and/or imiquimod.  The purpose is to decrease the number of clinically evident and subclinical PreCancerous lesions to prevent progression to development of skin cancer by chemically destroying early precancer changes that may or may not be visible.  It has been shown to reduce the risk of developing skin cancer in the treated area. As a result of treatment, redness, scaling, crusting, and open sores may occur during treatment course. One or more than one of these methods may be used and may have to be used several times to control, suppress and eliminate the PreCancerous changes. Discussed treatment course, expected reaction, and possible side effects. - Recommend daily broad spectrum sunscreen SPF 30+ to sun-exposed areas, reapply every 2 hours as needed.  - Staying in the shade or wearing long sleeves, sun glasses (UVA+UVB protection) and wide brim hats (4-inch brim around the entire circumference of the hat) are also recommended. - Call for new or changing lesions.  Start 5-fluorouracil/calcipotriene cream twice a day for 7 days to affected areas including left face. Prescription sent to [facility]. Patient provided with contact information for pharmacy and advised the pharmacy will mail the prescription to their home. Patient provided with handout reviewing treatment course and side effects and advised to call or message [place] on MyChart with any concerns.   Neoplasm of skin (2) R med dorsum hand  Epidermal / dermal shaving  Lesion diameter (cm):  1.6 Informed consent: discussed and consent obtained   Timeout: patient name, date of birth, surgical site, and procedure verified  Procedure prep:  Patient was prepped and draped in usual sterile fashion Prep type:  Isopropyl alcohol Anesthesia: the lesion was anesthetized in a standard fashion   Local anesthetic: bupivicaine. Instrument used: flexible razor blade   Hemostasis achieved with:  pressure, aluminum chloride and electrodesiccation   Outcome: patient tolerated procedure well   Post-procedure details: sterile dressing applied and wound care instructions given   Dressing type: bandage and petrolatum    Destruction of lesion Complexity: extensive   Destruction method: electrodesiccation and curettage   Informed consent: discussed and consent obtained   Timeout:  patient name, date of birth, surgical site, and procedure verified Procedure prep:  Patient was prepped and draped in usual sterile fashion Prep type:  Isopropyl alcohol Anesthesia: the lesion was anesthetized in a standard fashion   Local anesthetic: bupivicaine. Curettage performed in three different directions: Yes   Electrodesiccation performed over the curetted area: Yes   Lesion length (cm):  1.6 Lesion width (cm):  1.6 Margin per side (cm):  0.2 Final wound size (cm):  2 Hemostasis achieved with:  pressure, aluminum chloride and electrodesiccation Outcome: patient tolerated procedure well with no complications   Post-procedure details: sterile dressing applied and wound care instructions given   Dressing type: bandage and bacitracin    fluorouracil (EFUDEX) 5 % cream Apply topically 2 (two) times daily. Bid for 7 days to left cheek  mupirocin ointment (BACTROBAN) 2 % Apply 1 application topically daily. Qd to wound on hand  Specimen 1 - Surgical pathology Differential Diagnosis: D48.5 R/O SCC  Check Margins: yes Keratotic pap 1.6cm EDC today  L cheek  Consistent with  SCCIS (squamous cell carcinoma in situ) - due to patient's adverse reaction to either lidocaine or epinephrine we have avoided anesthesia and biopsy and treatment that involves anesthesia.   For the most part patient has responded well to treating multiple squamous cell carcinoma in situ's with LN2 in the past.  L cheek, L medial cheek c/w SCCIS - - Start 5-fluorouracil/calcipotriene cream twice a day for 7 days to affected  areas including L cheek. Prescription sent to [facility]. Patient advised they will receive an email to purchase the medication online and have it sent to their home. Patient provided with handout reviewing treatment course and side effects and advised to call or message [place] on MyChart with any concerns.   R med dorsum hand R/O SCC - Bx with bupivicaine today and shave removal   Return in about 3 months (around 07/10/2021) for f/u SCCIS.  I, Othelia Pulling, RMA, am acting as scribe for Sarina Ser, MD . Documentation: I have reviewed the above documentation for accuracy and completeness, and I agree with the above.  Sarina Ser, MD

## 2021-04-11 NOTE — Patient Instructions (Addendum)
If You Need Anything After Your Visit ° °If you have any questions or concerns for your doctor, please call our main line at [phone] and press option 4 to reach your doctor's medical assistant. If no one answers, please leave a voicemail as directed and we will return your call as soon as possible. Messages left after 4 pm will be answered the following business day.  ° °You may also send us a message via MyChart. We typically respond to MyChart messages within 1-2 business days. ° °For prescription refills, please ask your pharmacy to contact our office. Our fax number is [phone]. ° °If you have an urgent issue when the clinic is closed that cannot wait until the next business day, you can page your doctor at the number below.   ° °Please note that while we do our best to be available for urgent issues outside of office hours, we are not available 24/7.  ° °If you have an urgent issue and are unable to reach us, you may choose to seek medical care at your doctor's office, retail clinic, urgent care center, or emergency room. ° °If you have a medical emergency, please immediately call 911 or go to the emergency department. ° °Pager Numbers ° °- Dr. Kowalski: [phone] ° °- Dr. Moye: [phone] ° °- Dr. Stewart: [phone] ° °In the event of inclement weather, please call our main line at [phone] for an update on the status of any delays or closures. ° °Dermatology Medication Tips: °Please keep the boxes that topical medications come in in order to help keep track of the instructions about where and how to use these. Pharmacies typically print the medication instructions only on the boxes and not directly on the medication tubes.  ° °If your medication is too expensive, please contact our office at [phone] option 4 or send us a message through MyChart.  ° °We are unable to tell what your co-pay for medications will be in advance as this is different depending on your insurance coverage.  However, we may be able to find a substitute medication at lower cost or fill out paperwork to get insurance to cover a needed medication.  ° °If a prior authorization is required to get your medication covered by your insurance company, please allow us 1-2 business days to complete this process. ° °Drug prices often vary depending on where the prescription is filled and some pharmacies may offer cheaper prices. ° °The website www.goodrx.com contains coupons for medications through different pharmacies. The prices here do not account for what the cost may be with help from insurance (it may be cheaper with your insurance), but the website can give you the price if you did not use any insurance.  °- You can print the associated coupon and take it with your prescription to the pharmacy.  °- You may also stop by our office during regular business hours and pick up a GoodRx coupon card.  °- If you need your prescription sent electronically to a different pharmacy, notify our office through [facility] MyChart or by phone at [phone] option 4. ° ° ° ° °Si Usted Necesita Algo Después de Su Visita ° °También puede enviarnos un mensaje a través de MyChart. Por lo general respondemos a los mensajes de MyChart en el transcurso de 1 a 2 días hábiles. ° °Para renovar recetas, por favor pida a su farmacia que se ponga en contacto con nuestra oficina. Nuestro número de fax es el [phone]. ° °Si tiene   un asunto urgente cuando la clnica est cerrada y que no puede esperar hasta el siguiente da hbil, puede llamar/localizar a su doctor(a) al nmero que aparece a continuacin.   Por favor, tenga en cuenta que aunque hacemos todo lo posible para estar disponibles para asuntos urgentes fuera del horario de [place], no estamos disponibles las 24 horas del da, los 7 das de la [place].   Si tiene un problema urgente y no puede comunicarse con nosotros, puede optar por buscar atencin mdica  en el consultorio de su  doctor(a), en una clnica privada, en un centro de atencin urgente o en una sala de emergencias.  Si tiene Engineering geologist, por favor llame inmediatamente al 911 o vaya a la sala de emergencias.  Nmeros de bper  - Dr. Nehemiah Massed: [phone]  - Dra. Moye: [phone]  - Dra. Nicole Kindred: [phone]  En caso de inclemencias del [place], por favor llame a Johnsie Kindred principal al [phone] para una actualizacin sobre el [place] de cualquier retraso o cierre.  Consejos para la medicacin en dermatologa: Por favor, guarde las cajas en las que vienen los medicamentos de uso tpico para ayudarle a seguir las instrucciones sobre dnde y cmo usarlos. Las farmacias generalmente imprimen las instrucciones del medicamento slo en las cajas y no directamente en los tubos del [place].   Si su medicamento es muy caro, por favor, pngase en contacto con Zigmund Daniel llamando al [phone] y presione la opcin 4 o envenos un mensaje a travs de Pharmacist, community.   No podemos decirle cul ser su copago por los medicamentos por adelantado ya que esto es diferente dependiendo de la cobertura de su seguro. Sin embargo, es posible que podamos encontrar un medicamento sustituto a Electrical engineer un formulario para que el seguro cubra el medicamento que se considera necesario.   Si se requiere una autorizacin previa para que su compaa de seguros [place] su medicamento, por favor permtanos de 1 a 2 das hbiles para completar este proceso.  Los precios de los medicamentos varan con frecuencia dependiendo del Environmental consultant de dnde se surte la receta y alguna farmacias pueden ofrecer precios ms baratos.  El sitio web www.goodrx.com tiene cupones para medicamentos de Airline pilot. Los precios aqu no tienen en cuenta lo que podra costar con la ayuda del seguro (puede ser ms barato con su seguro), pero el sitio web puede darle el precio si no utiliz Research scientist (physical sciences).  - Puede imprimir el cupn  correspondiente y llevarlo con su receta a la farmacia.  - Tambin puede pasar por nuestra oficina durante el horario de atencin regular y Charity fundraiser una tarjeta de cupones de GoodRx.  - Si necesita que su receta se enve electrnicamente a una farmacia diferente, informe a nuestra oficina a travs de MyChart de [place] o por telfono llamando al [phone] y presione la opcin 4.   Wound Care Instructions  Cleanse wound gently with soap and water once a day then pat dry with clean gauze. Apply a thing coat of Petrolatum (petroleum jelly, "Vaseline") over the wound (unless you have an allergy to this). We recommend that you use a new, sterile tube of Vaseline. Do not pick or remove scabs. Do not remove the yellow or white "healing tissue" from the base of the wound.  Cover the wound with fresh, clean, nonstick gauze and secure with paper tape. You may use Band-Aids in place of gauze and tape if the would is small enough, but would recommend trimming much of  the tape off as there is often too much. Sometimes Band-Aids can irritate the skin.  You should call the office for your biopsy report after 1 week if you have not already been contacted.  If you experience any problems, such as abnormal amounts of bleeding, swelling, significant bruising, significant pain, or evidence of infection, please call the office immediately.  FOR ADULT SURGERY PATIENTS: If you need something for pain relief you may take 1 extra strength Tylenol (acetaminophen) AND 2 Ibuprofen (200mg  each) together every 4 hours as needed for pain. (do not take these if you are allergic to them or if you have a reason you should not take them.) Typically, you may only need pain medication for 1 to 3 days.    - Start 5-fluorouracil/calcipotriene cream twice a day for 7 days to affected areas including left cheek.  Can do 3 rounds of treatment with 2 week break in between treatments.  Prescription sent to [facility]. Patient  advised they will receive an email to purchase the medication online and have it sent to their home. Patient provided with handout reviewing treatment course and side effects and advised to call or message [place] on MyChart with any concerns.

## 2021-04-12 ENCOUNTER — Other Ambulatory Visit (HOSPITAL_COMMUNITY): Payer: Self-pay

## 2021-04-12 ENCOUNTER — Encounter: Payer: Self-pay | Admitting: Hematology and Oncology

## 2021-04-13 ENCOUNTER — Encounter: Payer: Self-pay | Admitting: Dermatology

## 2021-04-13 ENCOUNTER — Other Ambulatory Visit (HOSPITAL_COMMUNITY): Payer: Self-pay

## 2021-04-13 ENCOUNTER — Encounter: Payer: Self-pay | Admitting: Hematology and Oncology

## 2021-04-16 ENCOUNTER — Other Ambulatory Visit (HOSPITAL_COMMUNITY): Payer: Self-pay

## 2021-04-16 ENCOUNTER — Telehealth: Payer: Self-pay | Admitting: Pharmacy Technician

## 2021-04-16 NOTE — Telephone Encounter (Signed)
Oral Oncology Patient Advocate Encounter   Was successful in securing patient a $7,000 grant from [employer] to provide copayment coverage for Jakafi.  This will keep the out of pocket expense at $0.     The billing information is as follows and has been shared with [name].   Member ID: [id number] Group ID: [facility] RxBin: [id number] PCN: [id number] Dates of Eligibility: 04/12/21 through 04/12/22  Fund name:  Myeloproliferative Neoplasms  [name] Patient Laura Wilcox Phone [phone] Fax [phone] 04/16/2021 12:16 PM

## 2021-04-17 ENCOUNTER — Other Ambulatory Visit (HOSPITAL_COMMUNITY): Payer: Self-pay

## 2021-04-17 ENCOUNTER — Telehealth: Payer: Self-pay

## 2021-04-17 NOTE — Telephone Encounter (Signed)
-----   Message from Ralene Bathe, MD sent at 04/17/2021 10:38 AM EST ----- Diagnosis Skin , right med dorsum hand SQUAMOUS CELL CARCINOMA IN SITU ARISING IN A SEBORRHEIC KERATOSIS, CLOSE TO MARGIN  Cancer - SCC in situ Superficial As suspected. Already treated Recheck next visit

## 2021-04-17 NOTE — Telephone Encounter (Signed)
Left message on voicemail to return my call.  

## 2021-04-18 ENCOUNTER — Telehealth: Payer: Self-pay

## 2021-04-18 NOTE — Telephone Encounter (Signed)
Patient's daughter [name] informed of pathology results.

## 2021-04-18 NOTE — Telephone Encounter (Signed)
-----   Message from Ralene Bathe, MD sent at 04/17/2021 10:38 AM EST ----- Diagnosis Skin , right med dorsum hand SQUAMOUS CELL CARCINOMA IN SITU ARISING IN A SEBORRHEIC KERATOSIS, CLOSE TO MARGIN  Cancer - SCC in situ Superficial As suspected. Already treated Recheck next visit

## 2021-04-19 IMAGING — CT CT HEAD W/O CM
3 of 4 series · 15 of 47 positions shown, 18 images · non-contrast
Comparison: None.

CLINICAL DATA: Unable to obtain much hx from pt, was sent to the ER
for a blood transfusion. Per [facility] notes CT scan is for AMS.

EXAM:
CT HEAD WITHOUT CONTRAST
TECHNIQUE: Contiguous axial images were obtained from the base of the skull
through the vertex without intravenous contrast.

[Series 3: head wo · axial · 0.43mm/px · z∈[-112,+8]mm · 9 of 30 slices shown, 12 images]
[im 3/30  brain]
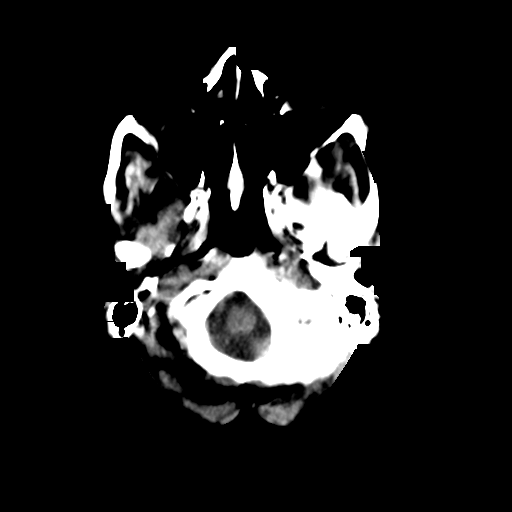
[im 3/30  bone]
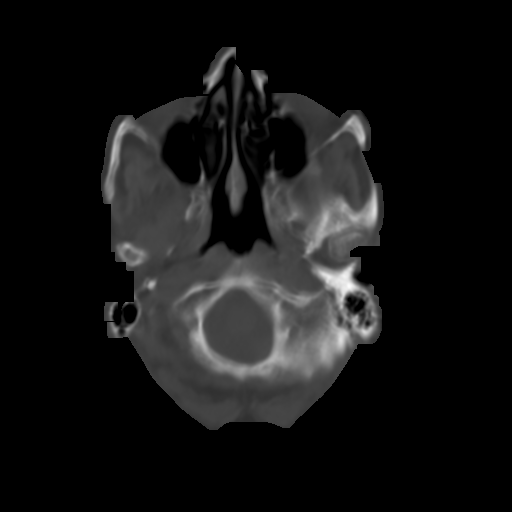
[im 6/30  brain]
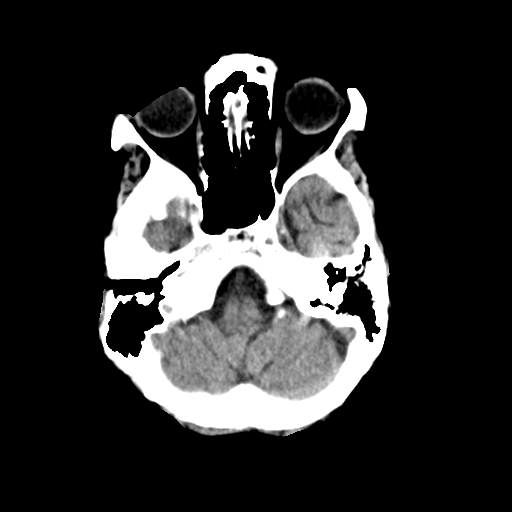
[im 9/30  brain]
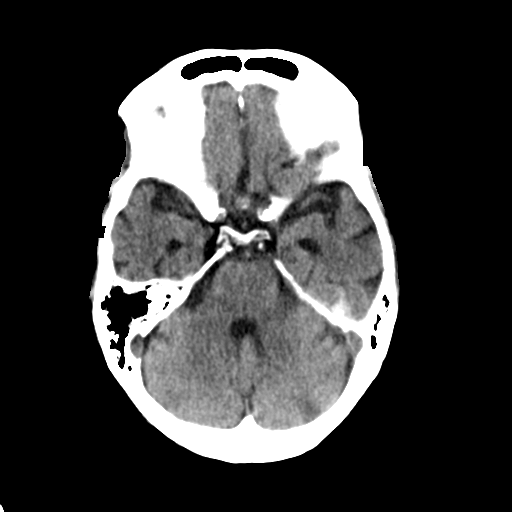
[im 12/30  brain]
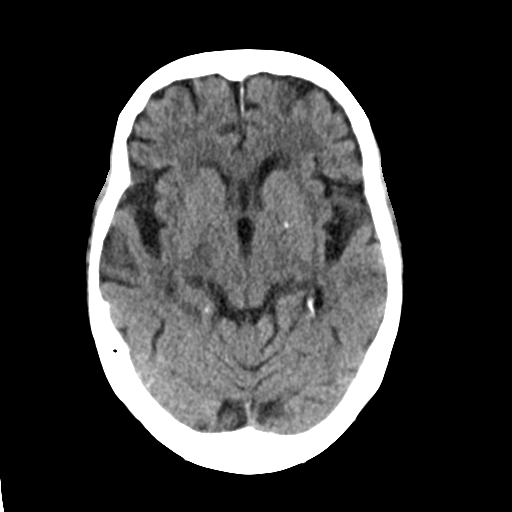
[im 16/30  brain]
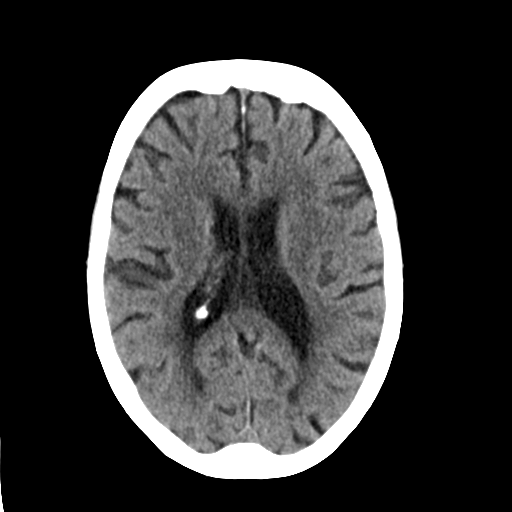
[im 16/30  bone]
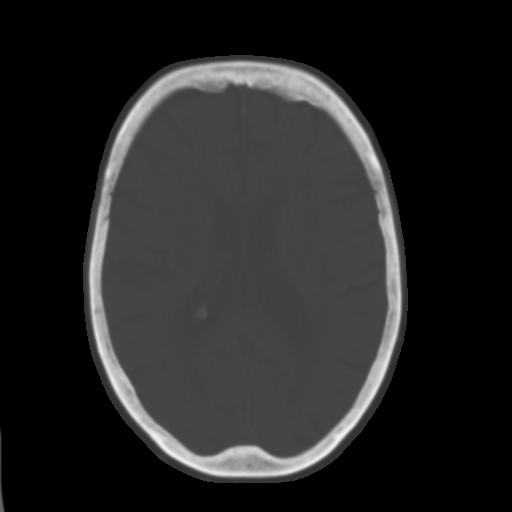
[im 18/30  brain]
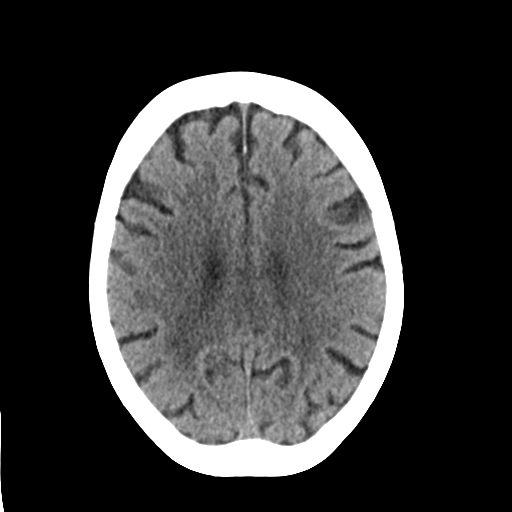
[im 21/30  brain]
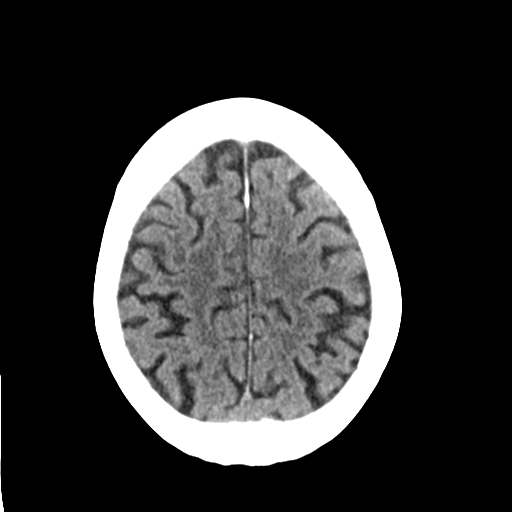
[im 24/30  brain]
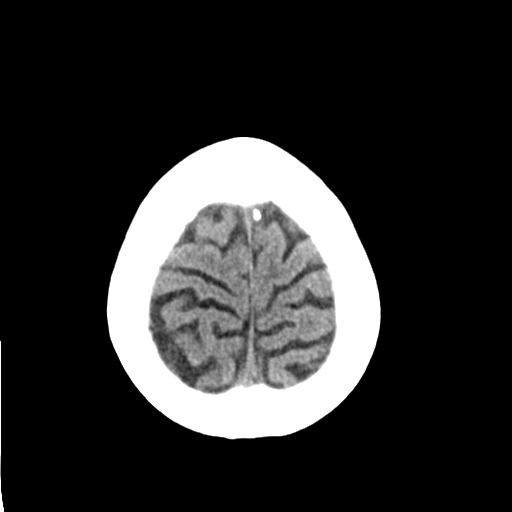
[im 27/30  brain]
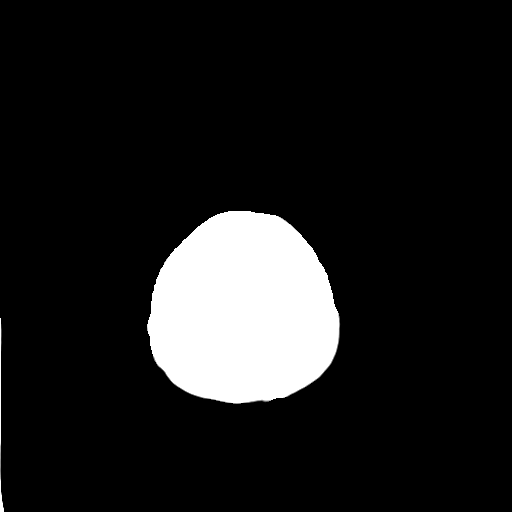
[im 27/30  bone]
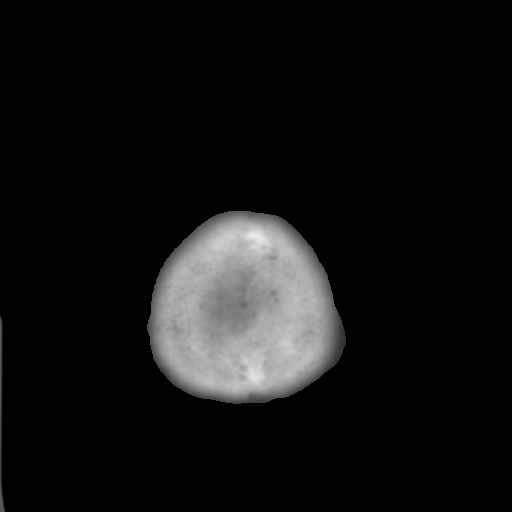

[Series 4: coronal soft tissue · coronal · 0.30mm/px · 3 of 66 slices shown]
[im 22/66  brain]
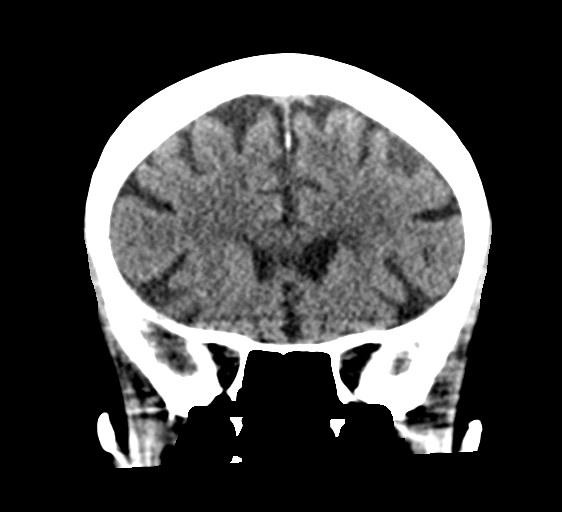
[im 29/66  brain]
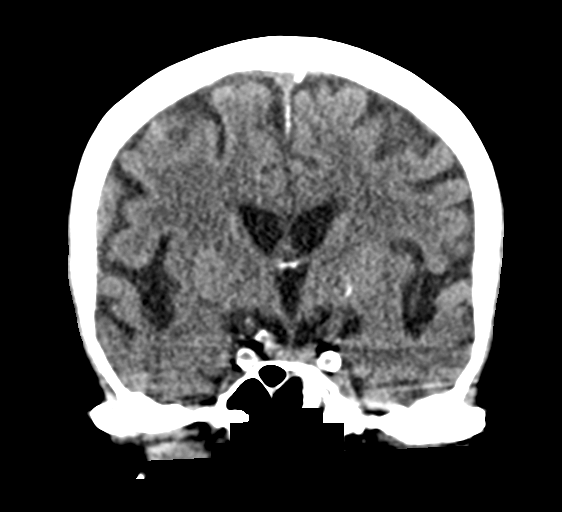
[im 37/66  brain]
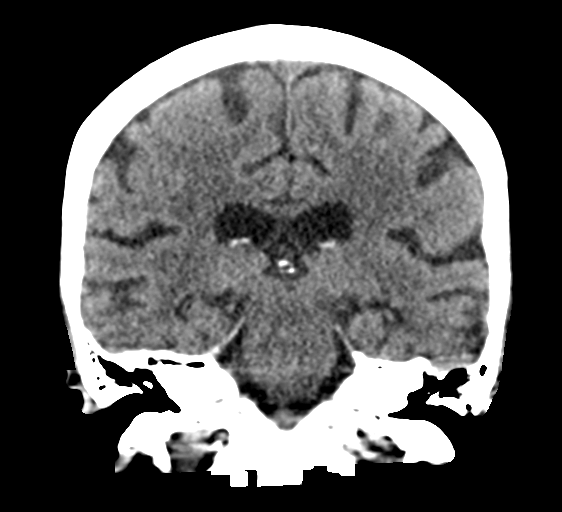

[Series 5: sagittal soft tissue · sagittal · 0.31mm/px · 3 of 48 slices shown]
[im 16/48  brain]
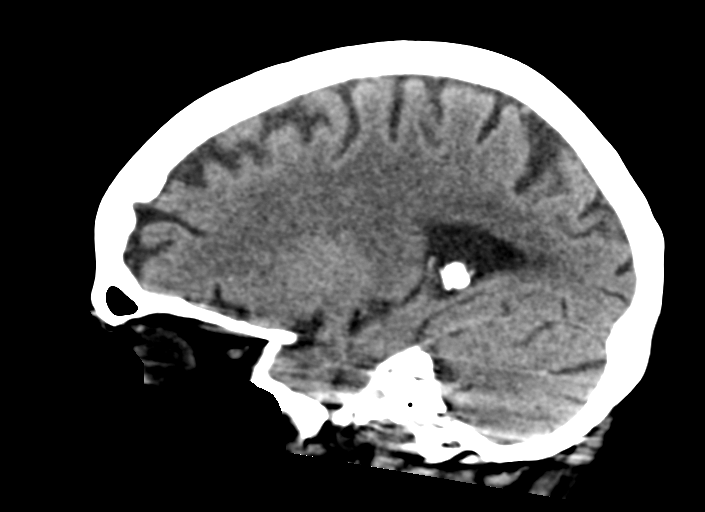
[im 24/48  brain]
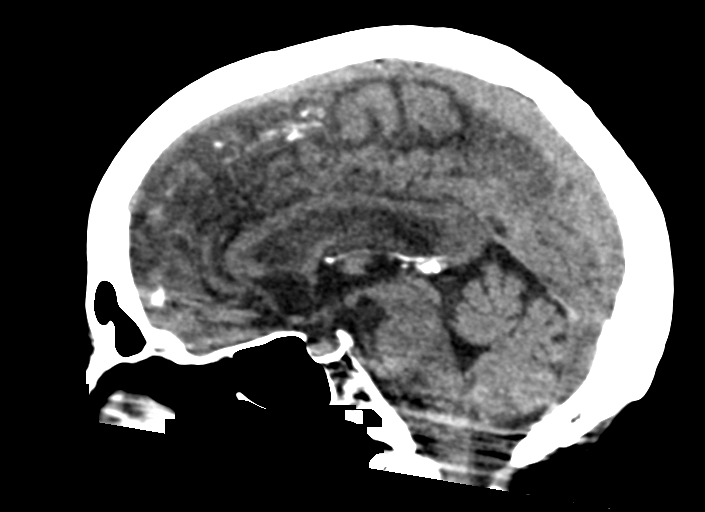
[im 32/48  brain]
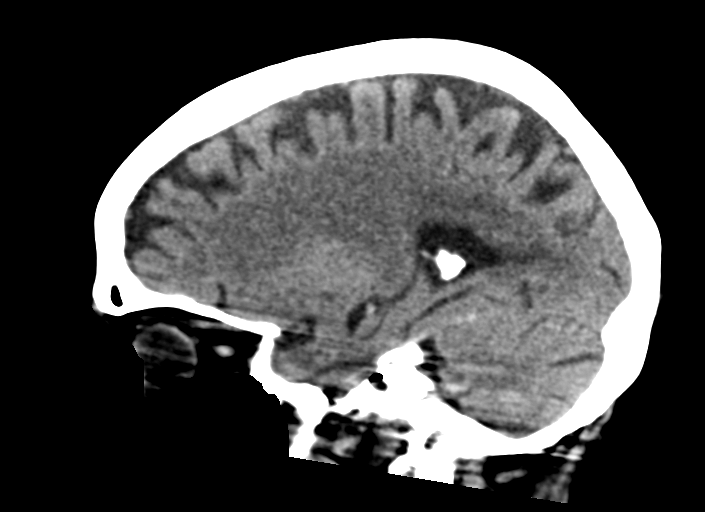

[15 of 47 positions shown; findings below may reference images not displayed]

FINDINGS: Brain: The ventricles are normal in size, for the patient's age, and
normal in configuration. There are no parenchymal masses or mass
effect. There is no evidence of an infarct. There are no extra-axial
masses or abnormal fluid collections.

No intracranial hemorrhage.

Mild periventricular white matter hypoattenuation is noted
consistent with chronic microvascular ischemic change.

Vascular: No hyperdense vessel or unexpected calcification.

Skull: Normal. Negative for fracture or focal lesion.

Sinuses/Orbits: Globes and orbits are unremarkable. Visualized
sinuses and mastoid air cells are clear.

Other: None.
IMPRESSION: 1. No acute intracranial abnormalities.
2. Mild chronic microvascular ischemic change. Mild,
age-appropriate, volume loss.

## 2021-04-20 ENCOUNTER — Telehealth: Payer: Self-pay | Admitting: Pharmacy Technician

## 2021-04-25 ENCOUNTER — Inpatient Hospital Stay: Payer: Medicare Other

## 2021-04-25 ENCOUNTER — Encounter: Payer: Self-pay | Admitting: Oncology

## 2021-04-25 ENCOUNTER — Inpatient Hospital Stay (HOSPITAL_BASED_OUTPATIENT_CLINIC_OR_DEPARTMENT_OTHER): Payer: Medicare Other | Admitting: Oncology

## 2021-04-25 ENCOUNTER — Inpatient Hospital Stay: Payer: Medicare Other | Attending: Oncology

## 2021-04-25 ENCOUNTER — Other Ambulatory Visit (HOSPITAL_COMMUNITY): Payer: Self-pay

## 2021-04-25 ENCOUNTER — Other Ambulatory Visit: Payer: Self-pay

## 2021-04-25 VITALS — BP 134/71 | HR 80 | Temp 96.7°F | Resp 18 | Wt 112.1 lb

## 2021-04-25 DIAGNOSIS — Z803 Family history of malignant neoplasm of breast: Secondary | ICD-10-CM | POA: Diagnosis not present

## 2021-04-25 DIAGNOSIS — D7581 Myelofibrosis: Secondary | ICD-10-CM

## 2021-04-25 DIAGNOSIS — G9341 Metabolic encephalopathy: Secondary | ICD-10-CM | POA: Diagnosis not present

## 2021-04-25 DIAGNOSIS — Z818 Family history of other mental and behavioral disorders: Secondary | ICD-10-CM | POA: Insufficient documentation

## 2021-04-25 DIAGNOSIS — D709 Neutropenia, unspecified: Secondary | ICD-10-CM | POA: Insufficient documentation

## 2021-04-25 DIAGNOSIS — D45 Polycythemia vera: Secondary | ICD-10-CM | POA: Insufficient documentation

## 2021-04-25 DIAGNOSIS — Z85828 Personal history of other malignant neoplasm of skin: Secondary | ICD-10-CM | POA: Diagnosis not present

## 2021-04-25 DIAGNOSIS — R4189 Other symptoms and signs involving cognitive functions and awareness: Secondary | ICD-10-CM | POA: Insufficient documentation

## 2021-04-25 DIAGNOSIS — N1832 Chronic kidney disease, stage 3b: Secondary | ICD-10-CM | POA: Diagnosis not present

## 2021-04-25 DIAGNOSIS — F0390 Unspecified dementia without behavioral disturbance: Secondary | ICD-10-CM | POA: Diagnosis not present

## 2021-04-25 DIAGNOSIS — Z87440 Personal history of urinary (tract) infections: Secondary | ICD-10-CM | POA: Insufficient documentation

## 2021-04-25 DIAGNOSIS — Z8249 Family history of ischemic heart disease and other diseases of the circulatory system: Secondary | ICD-10-CM | POA: Diagnosis not present

## 2021-04-25 DIAGNOSIS — Z79899 Other long term (current) drug therapy: Secondary | ICD-10-CM | POA: Insufficient documentation

## 2021-04-25 DIAGNOSIS — D471 Chronic myeloproliferative disease: Secondary | ICD-10-CM | POA: Insufficient documentation

## 2021-04-25 DIAGNOSIS — D631 Anemia in chronic kidney disease: Secondary | ICD-10-CM | POA: Insufficient documentation

## 2021-04-25 DIAGNOSIS — Z881 Allergy status to other antibiotic agents status: Secondary | ICD-10-CM | POA: Diagnosis not present

## 2021-04-25 LAB — COMPREHENSIVE METABOLIC PANEL
ALT: 12 U/L (ref 0–44)
AST: 19 U/L (ref 15–41)
Albumin: 4.2 g/dL (ref 3.5–5.0)
Alkaline Phosphatase: 70 U/L (ref 38–126)
Anion gap: 10 (ref 5–15)
BUN: 26 mg/dL — ABNORMAL HIGH (ref 8–23)
CO2: 25 mmol/L (ref 22–32)
Calcium: 9.4 mg/dL (ref 8.9–10.3)
Chloride: 100 mmol/L (ref 98–111)
Creatinine, Ser: 1.32 mg/dL — ABNORMAL HIGH (ref 0.44–1.00)
GFR, Estimated: 38 mL/min — ABNORMAL LOW (ref >60)
Glucose, Bld: 110 mg/dL — ABNORMAL HIGH (ref 70–99)
Potassium: 4.1 mmol/L (ref 3.5–5.1)
Sodium: 135 mmol/L (ref 135–145)
Total Bilirubin: 0.3 mg/dL (ref 0.3–1.2)
Total Protein: 7 g/dL (ref 6.5–8.1)

## 2021-04-25 LAB — CBC WITH DIFFERENTIAL/PLATELET
Abs Immature Granulocytes: 0.53 10*3/uL — ABNORMAL HIGH (ref 0.00–0.07)
Basophils Absolute: 0.2 10*3/uL — ABNORMAL HIGH (ref 0.0–0.1)
Basophils Relative: 2 %
Eosinophils Absolute: 0.3 10*3/uL (ref 0.0–0.5)
Eosinophils Relative: 4 %
HCT: 30.2 % — ABNORMAL LOW (ref 36.0–46.0)
Hemoglobin: 9.5 g/dL — ABNORMAL LOW (ref 12.0–15.0)
Immature Granulocytes: 6 %
Lymphocytes Relative: 22 %
Lymphs Abs: 2.1 10*3/uL (ref 0.7–4.0)
MCH: 30 pg (ref 26.0–34.0)
MCHC: 31.5 g/dL (ref 30.0–36.0)
MCV: 95.3 fL (ref 80.0–100.0)
Monocytes Absolute: 0.9 10*3/uL (ref 0.1–1.0)
Monocytes Relative: 9 %
Neutro Abs: 5.4 10*3/uL (ref 1.7–7.7)
Neutrophils Relative %: 57 %
Platelets: 508 10*3/uL — ABNORMAL HIGH (ref 150–400)
RBC: 3.17 MIL/uL — ABNORMAL LOW (ref 3.87–5.11)
RDW: 23 % — ABNORMAL HIGH (ref 11.5–15.5)
Smear Review: INCREASED
WBC: 9.4 10*3/uL (ref 4.0–10.5)
nRBC: 0.4 % — ABNORMAL HIGH (ref 0.0–0.2)

## 2021-04-25 MED ORDER — EPOETIN ALFA-EPBX 10000 UNIT/ML IJ SOLN
10000.0000 [IU] | Freq: Once | INTRAMUSCULAR | Status: AC
  Administered 2021-04-25: 15:00:00 10000 [IU] via SUBCUTANEOUS
  Filled 2021-04-25: qty 1

## 2021-04-25 MED ORDER — RUXOLITINIB PHOSPHATE 10 MG PO TABS
10.0000 mg | ORAL_TABLET | Freq: Two times a day (BID) | ORAL | 2 refills | Status: DC
  Filled 2021-04-25 – 2021-05-08 (×2): qty 60, 30d supply, fill #0

## 2021-04-25 NOTE — Progress Notes (Signed)
Hematology Oncology Progress Note   Clinic Day:  04/25/2021   Referring physician: Earlie Server, MD  Chief Complaint: Laura Wilcox is a 88 y.o. female with polycythemia vera (PV) and secondary myelofibrosis presents for follow up   Laura Wilcox is a 88 y.o.afemale who has above oncology history reviewed by me today presented for follow up visit for management of  polycythemia vera  Patient previously followed up by Dr.Corcoran, patient switched care to me on 11/16/20 Extensive medical record review was performed by me  Feb 2014 polycythemia vera and secondary myelofibrosis.  She presented in 04/2002 with thrombocytosis (835,000).  JAK2 V617F was positive   Bone marrow was on 04/15/2002 revealed a normocellular marrow for age with trilineage hematopoiesis and mild megakaryocytic hyperplasia with focal atypia.  There was no morphologic evidence of a myeloproliferative disorder.  Iron stains were inadequate for evaluation of storage iron.  There was no increase in reticulin fibers.  She was started on Agrylin in 2012  Bone marrow on 09/25/2012 revealed persistent myeloproliferative disorder s/p Agrylin.  The current features were best classified as persistent myeloproliferative neoplasm (MPN) JAK2 V617F mutation positive.  The current features of markedly increased atypical megakaryocytes paresis, with frequent clustering with frequent hyper/deeply lobulated forms, bizarre forms and forms with staghorn nuclei, hyperchromatic forms and abundant cytoplasm was highly suggestive of a myeloproliferative neoplasm.  Marrow was hypercellular for age (30-50%) with increased trilineage hematopoiesis and markedly increased atypical megakaryopoiesis with frequent clustering.  There were no increased blasts.  Iron stores were absent.  There was moderate to severe myelofibrosis (grade MF 2-3).  Differential includes ET, PV, primary myelofibrosis, and MPN, unclassifiable.  Cytogenetics  were normal (43, XX). She developed pancytopenia in 12/2015.  Etiology is unclear (medication confusion or hydroxyurea + Agrylin).   She received Procrit 40,000 units (intermittently 09/2012 - 06/2016), Venofer (09/2012 - 10/2012), and Infed (12/2015 - 03/2016).  She received Neupogen during a period of neutropenia.   She was anagrelide from 2004 - 08/13/2017.  She began [place] on 08/14/2017.  Jakafi was increased from 5 mg BID to 10 mg BID on 06/17/2018.  Jakafi was increased to 15 mg BID on 08/03/2018.  She has been back on Jakafi 5 mg BID since 02/01/2019 then 10 mg BID on 05/25/2019.  [name] was on hold from 12/20/2019 - 01/12/2020.  Jakafi 5 mg twice daily restarted on 01/12/2020 then increased to 10 mg in the morning and 5 mg in the evening on 02/02/2020. [name] is currently 20 mg po BID.  She has received Retacrit to maintain a hemoglobin > 10 (12/24/2019 and 03   Squamous cell carcinoma left cheek- s/p cryotherapy and debridement. Managed by Dr. Nehemiah Massed  10/22/2020- 10/26/2020 Hospitalization due to acute metabolic encephalopathy, UTI,E. coli bacteremia AKI on CKD, acute on chronic anemia. [name] was held during the admission. She was discharged and spent a short period of time in rehab. [name] was held.   11/06/2020 seen by symptom management for weakness.  11/16/2020 established care with me. Resumed Jakafi at 10mg  BID  #Dementia/cognitive impairment- patient has hx of dementia. Per daughter, it has been difficult to convince patent to see neurology for further evaluation. She is not on any medication for her memory loss.  She has been referred to home health.   INTERVAL HISTORY Laura Wilcox is a 88 y.o. female who has above history reviewed by me today presents for follow up visit for management of polycythemia rubra vera and secondary myelofibrosis.   Patient  was accompanied by daughter Laura Wilcox   Patient reports feeling well. Per daughter, patient has been missing dosing  recently. Daughter plans to purchase a more transparent pillbox hopefully this will help patient to realize that she has pills to take. Appetite is fair.  Weight is stable.  Past Medical History:  Diagnosis Date   Acute metabolic encephalopathy 31/49/7026   Confusion 12/30/2018   Polycythemia    SCC (squamous cell carcinoma) 04/11/2021   R med dorsum hand - ED&C SCCIS   Squamous cell carcinoma of skin 09/06/2020   R thumb - ED&C   Urinary tract infection without hematuria 01/02/2020   Urticaria 06/02/2017    Past Surgical History:  Procedure Laterality Date   ABDOMINAL HYSTERECTOMY     complete   BREAST SURGERY      Family History  Problem Relation Age of Onset   Heart disease Father    Cancer Sister        breast   Cancer Sister        breast   Alzheimer's disease Sister     Social History:  reports that she has never smoked. She has never used smokeless tobacco. She reports that she does not drink alcohol and does not use drugs. She is widowed.  Patient is retired Sport and exercise psychologist. Patient moved to [facility] in 03/2017.  She lives independently in her own apartment in [place].   Pt's daughter, Laura Wilcox, can be reached at [phone].  She lives in [place]. The patient is accompanied by her daughter today.   Allergies:  Allergies  Allergen Reactions   Bactrim [Sulfamethoxazole-Trimethoprim]     Due to kidney disease    Epinephrine Other (See Comments)    Current Medications: Current Outpatient Medications  Medication Sig Dispense Refill   acetaminophen (TYLENOL) 325 MG tablet Take 325-650 mg by mouth every 6 (six) hours as needed for mild pain or moderate pain.     aspirin EC 81 MG tablet Take 1 tablet (81 mg total) by mouth daily. Swallow whole. 30 tablet 0   calcitRIOL (ROCALTROL) 0.25 MCG capsule Take 0.25 mcg by mouth every Monday, Wednesday, and Friday.     fluorouracil (EFUDEX) 5 % cream Apply to lesion on the right lower leg BID x 1 week. Take a 3 week break then repeat  treatment. Do this for 3 rounds. 15 g 2   mupirocin ointment (BACTROBAN) 2 % Apply 1 application topically daily. Qd to wound on hand 22 g 1   fluorouracil (EFUDEX) 5 % cream Apply topically 2 (two) times daily. Bid for 7 days to left cheek (Patient not taking: Reported on 04/25/2021) 15 g 1   ruxolitinib phosphate (JAKAFI) 10 MG tablet Take 1 tablet (10 mg total) by mouth 2 (two) times daily. 60 tablet 2   No current facility-administered medications for this visit.    Review of Systems  Unable to perform ROS: Dementia   Performance status (ECOG):  2  Vitals Blood pressure 134/71, pulse 80, temperature (!) 96.7 F (35.9 C), resp. rate 18, weight 112 lb 1.6 oz (50.8 kg).   Physical Exam Vitals reviewed.  Constitutional:      General: She is not in acute distress.    Appearance: She is well-developed. She is not ill-appearing.  HENT:     Head: Normocephalic and atraumatic.     Nose: Nose normal. No congestion.     Mouth/Throat:     Pharynx: No oropharyngeal exudate.  Eyes:     General: No scleral icterus.  Conjunctiva/sclera: Conjunctivae normal.  Cardiovascular:     Rate and Rhythm: Normal rate and regular rhythm.     Pulses: Normal pulses.     Heart sounds: No murmur heard. Pulmonary:     Effort: Pulmonary effort is normal. No respiratory distress.  Abdominal:     General: There is no distension.     Tenderness: There is no abdominal tenderness. There is no guarding.  Musculoskeletal:        General: No deformity. Normal range of motion.     Right lower leg: No edema.     Left lower leg: No edema.     Comments: Ambulates without aids  Skin:    General: Skin is warm and dry.     Coloration: Skin is not pale.  Neurological:     Mental Status: She is alert. Mental status is at baseline.     Motor: No weakness.  Psychiatric:        Attention and Perception: Attention normal.        Mood and Affect: Mood and affect normal.        Behavior: Behavior normal. Behavior  is cooperative.        Cognition and Memory: Cognition is impaired. Memory is impaired.    Appointment on 04/25/2021  Component Date Value Ref Range Status   Sodium 04/25/2021 135  135 - 145 mmol/L Final   Potassium 04/25/2021 4.1  3.5 - 5.1 mmol/L Final   Chloride 04/25/2021 100  98 - 111 mmol/L Final   CO2 04/25/2021 25  22 - 32 mmol/L Final   Glucose, Bld 04/25/2021 110 (H)  70 - 99 mg/dL Final   Glucose reference range applies only to samples taken after fasting for at least 8 hours.   BUN 04/25/2021 26 (H)  8 - 23 mg/dL Final   Creatinine, Ser 04/25/2021 1.32 (H)  0.44 - 1.00 mg/dL Final   Calcium 04/25/2021 9.4  8.9 - 10.3 mg/dL Final   Total Protein 04/25/2021 7.0  6.5 - 8.1 g/dL Final   Albumin 04/25/2021 4.2  3.5 - 5.0 g/dL Final   AST 04/25/2021 19  15 - 41 U/L Final   ALT 04/25/2021 12  0 - 44 U/L Final   Alkaline Phosphatase 04/25/2021 70  38 - 126 U/L Final   Total Bilirubin 04/25/2021 0.3  0.3 - 1.2 mg/dL Final   GFR, Estimated 04/25/2021 38 (L)  >60 mL/min Final   Comment: (NOTE) Calculated using the CKD-EPI Creatinine Equation (2021)    Anion gap 04/25/2021 10  5 - 15 Final   Performed at [facility], [address] [place], [place] [address]   WBC 04/25/2021 9.4  4.0 - 10.5 K/uL Final   RBC 04/25/2021 3.17 (L)  3.87 - 5.11 MIL/uL Final   Hemoglobin 04/25/2021 9.5 (L)  12.0 - 15.0 g/dL Final   HCT 04/25/2021 30.2 (L)  36.0 - 46.0 % Final   MCV 04/25/2021 95.3  80.0 - 100.0 fL Final   MCH 04/25/2021 30.0  26.0 - 34.0 pg Final   MCHC 04/25/2021 31.5  30.0 - 36.0 g/dL Final   RDW 04/25/2021 23.0 (H)  11.5 - 15.5 % Final   Platelets 04/25/2021 508 (H)  150 - 400 K/uL Final   nRBC 04/25/2021 0.4 (H)  0.0 - 0.2 % Final   Neutrophils Relative % 04/25/2021 57  % Final   Neutro Abs 04/25/2021 5.4  1.7 - 7.7 K/uL Final   Lymphocytes Relative 04/25/2021 22  %  Final   Lymphs Abs 04/25/2021 2.1  0.7 - 4.0 K/uL Final   Monocytes Relative 04/25/2021 9  % Final    Monocytes Absolute 04/25/2021 0.9  0.1 - 1.0 K/uL Final   Eosinophils Relative 04/25/2021 4  % Final   Eosinophils Absolute 04/25/2021 0.3  0.0 - 0.5 K/uL Final   Basophils Relative 04/25/2021 2  % Final   Basophils Absolute 04/25/2021 0.2 (H)  0.0 - 0.1 K/uL Final   WBC Morphology 04/25/2021 MILD LEFT SHIFT (1-5% METAS, OCC MYELO, OCC BANDS)   Final   DIFF. CONFIRMED BY SMEAR   RBC Morphology 04/25/2021 MORPHOLOGY UNREMARKABLE   Final   Smear Review 04/25/2021 PLATELETS APPEAR INCREASED   Final   Immature Granulocytes 04/25/2021 6  % Final   Abs Immature Granulocytes 04/25/2021 0.53 (H)  0.00 - 0.07 K/uL Final   Performed at [facility], [place], [place] [address]   [facility]     Component Value Date/Time   NA 135 04/25/2021 1409   K 4.1 04/25/2021 1409   CL 100 04/25/2021 1409   CO2 25 04/25/2021 1409   GLUCOSE 110 (H) 04/25/2021 1409   BUN 26 (H) 04/25/2021 1409   CREATININE 1.32 (H) 04/25/2021 1409   CALCIUM 9.4 04/25/2021 1409   PROT 7.0 04/25/2021 1409   ALBUMIN 4.2 04/25/2021 1409   AST 19 04/25/2021 1409   ALT 12 04/25/2021 1409   ALKPHOS 70 04/25/2021 1409   BILITOT 0.3 04/25/2021 1409   GFRNONAA 38 (L) 04/25/2021 1409   GFRAA 49 (L) 12/27/2019 0935    Assessment and Plan.  1. Anemia in stage 3b chronic kidney disease ([place])   2. Myelofibrosis ([place])   3. Polycythemia vera ([place])    # Polycythemia vera and  secondary myelofibrosis. Labs reviewed and discussed with patient. The fluctuation of platelet counts is likely due to missing dosing.  Continue Jakafi 10 mg twice daily.  # Anemia due to CKD, and myelofibrosis.  Hemoglobin is less than 10. Proceed with Retacrit 10,000 units x 1 today.  Offered patient for home health for medication management.  Daughter and patient preferred to defer home health at this point.  Follow up in   Lab CBC +/- Retacrit in 4 weeks and 8 weeks lab MD 12 weeks +/- Retacrit.   Earlie Server, MD, PhD 04/25/2021

## 2021-04-25 NOTE — Telephone Encounter (Signed)
Oral Oncology Patient Advocate Encounter   Was successful in securing patient a second grant in the amount of $8700 from [facility]) to provide copayment coverage for Jakafi.  This will keep the out of pocket expense at $0.     The billing information is as follows and has been shared with [place].   Member ID: [id number] Group ID: [id number] RxBin: [id number] Dates of Eligibility: 04/20/21 through 08/07/21  Fund: Myeloproliferative Neoplasms  [place] [facility] [place] Phone [phone] Fax [phone] 04/25/2021 8:45 AM

## 2021-04-25 NOTE — Progress Notes (Signed)
Pt here for follow up. No new concerns voiced.   

## 2021-05-04 ENCOUNTER — Encounter: Payer: Self-pay | Admitting: Hematology and Oncology

## 2021-05-04 ENCOUNTER — Other Ambulatory Visit (HOSPITAL_COMMUNITY): Payer: Self-pay

## 2021-05-08 ENCOUNTER — Other Ambulatory Visit (HOSPITAL_COMMUNITY): Payer: Self-pay

## 2021-05-23 ENCOUNTER — Inpatient Hospital Stay: Payer: Medicare Other

## 2021-05-23 ENCOUNTER — Inpatient Hospital Stay: Payer: Medicare Other | Attending: Oncology

## 2021-05-23 ENCOUNTER — Other Ambulatory Visit: Payer: Self-pay

## 2021-05-23 VITALS — BP 127/68 | HR 86

## 2021-05-23 DIAGNOSIS — N1832 Chronic kidney disease, stage 3b: Secondary | ICD-10-CM | POA: Insufficient documentation

## 2021-05-23 DIAGNOSIS — Z79899 Other long term (current) drug therapy: Secondary | ICD-10-CM | POA: Insufficient documentation

## 2021-05-23 DIAGNOSIS — D631 Anemia in chronic kidney disease: Secondary | ICD-10-CM | POA: Diagnosis not present

## 2021-05-23 DIAGNOSIS — D7581 Myelofibrosis: Secondary | ICD-10-CM

## 2021-05-23 DIAGNOSIS — D45 Polycythemia vera: Secondary | ICD-10-CM

## 2021-05-23 LAB — CBC WITH DIFFERENTIAL/PLATELET
Abs Immature Granulocytes: 0.59 10*3/uL — ABNORMAL HIGH (ref 0.00–0.07)
Basophils Absolute: 0.1 10*3/uL (ref 0.0–0.1)
Basophils Relative: 2 %
Eosinophils Absolute: 0.3 10*3/uL (ref 0.0–0.5)
Eosinophils Relative: 3 %
HCT: 29.2 % — ABNORMAL LOW (ref 36.0–46.0)
Hemoglobin: 9 g/dL — ABNORMAL LOW (ref 12.0–15.0)
Immature Granulocytes: 7 %
Lymphocytes Relative: 14 %
Lymphs Abs: 1.2 10*3/uL (ref 0.7–4.0)
MCH: 30.3 pg (ref 26.0–34.0)
MCHC: 30.8 g/dL (ref 30.0–36.0)
MCV: 98.3 fL (ref 80.0–100.0)
Monocytes Absolute: 1.1 10*3/uL — ABNORMAL HIGH (ref 0.1–1.0)
Monocytes Relative: 13 %
Neutro Abs: 5.5 10*3/uL (ref 1.7–7.7)
Neutrophils Relative %: 61 %
Platelets: 505 10*3/uL — ABNORMAL HIGH (ref 150–400)
RBC: 2.97 MIL/uL — ABNORMAL LOW (ref 3.87–5.11)
RDW: 23.4 % — ABNORMAL HIGH (ref 11.5–15.5)
WBC: 8.8 10*3/uL (ref 4.0–10.5)
nRBC: 0.5 % — ABNORMAL HIGH (ref 0.0–0.2)

## 2021-05-23 MED ORDER — EPOETIN ALFA-EPBX 10000 UNIT/ML IJ SOLN
10000.0000 [IU] | Freq: Once | INTRAMUSCULAR | Status: AC
  Administered 2021-05-23: 10000 [IU] via SUBCUTANEOUS
  Filled 2021-05-23: qty 1

## 2021-05-24 ENCOUNTER — Other Ambulatory Visit (HOSPITAL_COMMUNITY): Payer: Self-pay

## 2021-05-31 DIAGNOSIS — D631 Anemia in chronic kidney disease: Secondary | ICD-10-CM | POA: Diagnosis not present

## 2021-05-31 DIAGNOSIS — N2581 Secondary hyperparathyroidism of renal origin: Secondary | ICD-10-CM | POA: Diagnosis not present

## 2021-05-31 DIAGNOSIS — D45 Polycythemia vera: Secondary | ICD-10-CM | POA: Diagnosis not present

## 2021-05-31 DIAGNOSIS — R809 Proteinuria, unspecified: Secondary | ICD-10-CM | POA: Diagnosis not present

## 2021-05-31 DIAGNOSIS — N1832 Chronic kidney disease, stage 3b: Secondary | ICD-10-CM | POA: Diagnosis not present

## 2021-06-13 ENCOUNTER — Other Ambulatory Visit: Payer: Self-pay | Admitting: *Deleted

## 2021-06-13 DIAGNOSIS — D45 Polycythemia vera: Secondary | ICD-10-CM

## 2021-06-13 DIAGNOSIS — D631 Anemia in chronic kidney disease: Secondary | ICD-10-CM

## 2021-06-14 ENCOUNTER — Ambulatory Visit: Payer: Self-pay | Admitting: *Deleted

## 2021-06-14 NOTE — Telephone Encounter (Signed)
?  Chief Complaint: confusion ?Symptoms: was left along, ? If go dehydrated ?Frequency: getting better ?Pertinent Negatives: Patient denies fever, took VS at home, they are her usual according to daughter ?Disposition: '[x]'$ ED /'[]'$ Urgent Care (no appt availability in office) / '[x]'$ Appointment(In office/virtual)/ '[]'$  [facility]/ '[]'$ Home Care/ '[]'$ Refused Recommended Disposition /'[]'$ [facility] [facility]/ '[]'$  Follow-up with PCP ?Additional Notes: ED was advised due to pt being alone multiple days and daughter questioning UTI, daughter states she hydrated her now and is better, has appt tomorrow, 06/15/21. ? ? ?Reason for Disposition ? [1] Longstanding confusion (e.g., dementia, stroke) AND [2] worsening ? ?Answer Assessment - Initial Assessment Questions ?1. LEVEL OF CONSCIOUSNESS: "How is he (she, the patient) acting right now?" (e.g., alert-oriented, confused, lethargic, stuporous, comatose) ?    More confused, forgot to eat ?2. ONSET: "When did the confusion start?"  (minutes, hours, days) ?    Daughter found her today ?3. PATTERN "Does this come and go, or has it been constant since it started?"  "Is it present now?" ?    Has dementia ?4. ALCOHOL or DRUGS: "Has he been drinking alcohol or taking any drugs?"  ?    no ?5. NARCOTIC MEDICATIONS: "Has he been receiving any narcotic medications?" (e.g., morphine, Vicodin) ?    no ?6. CAUSE: "What do you think is causing the confusion?"  ?    Was confused and did not eat or drink, daughter got her to eat and drink all da today and seems better ?7. OTHER SYMPTOMS: "Are there any other symptoms?" (e.g., difficulty breathing, headache, fever, weakness) ?    Daughter took VS, states VS stable 104/66 HR 96 93% oxygen ? ?Protocols used: Confusion - Delirium-A-AH ? ?

## 2021-06-15 ENCOUNTER — Emergency Department: Payer: Medicare Other

## 2021-06-15 ENCOUNTER — Emergency Department
Admission: EM | Admit: 2021-06-15 | Discharge: 2021-06-15 | Disposition: A | Discharge status: Home / Self Care | Payer: Medicare Other | Attending: Emergency Medicine | Admitting: Emergency Medicine

## 2021-06-15 ENCOUNTER — Ambulatory Visit: Payer: Self-pay | Admitting: *Deleted

## 2021-06-15 ENCOUNTER — Other Ambulatory Visit: Payer: Self-pay

## 2021-06-15 ENCOUNTER — Ambulatory Visit: Payer: PRIVATE HEALTH INSURANCE | Admitting: Internal Medicine

## 2021-06-15 ENCOUNTER — Telehealth: Payer: Self-pay

## 2021-06-15 DIAGNOSIS — E860 Dehydration: Secondary | ICD-10-CM | POA: Diagnosis not present

## 2021-06-15 DIAGNOSIS — R0689 Other abnormalities of breathing: Secondary | ICD-10-CM | POA: Diagnosis not present

## 2021-06-15 DIAGNOSIS — R4182 Altered mental status, unspecified: Secondary | ICD-10-CM | POA: Insufficient documentation

## 2021-06-15 DIAGNOSIS — R770 Abnormality of albumin: Secondary | ICD-10-CM | POA: Insufficient documentation

## 2021-06-15 DIAGNOSIS — Z20822 Contact with and (suspected) exposure to covid-19: Secondary | ICD-10-CM | POA: Diagnosis not present

## 2021-06-15 DIAGNOSIS — E8809 Other disorders of plasma-protein metabolism, not elsewhere classified: Secondary | ICD-10-CM

## 2021-06-15 DIAGNOSIS — Z743 Need for continuous supervision: Secondary | ICD-10-CM | POA: Diagnosis not present

## 2021-06-15 DIAGNOSIS — R404 Transient alteration of awareness: Secondary | ICD-10-CM | POA: Diagnosis not present

## 2021-06-15 DIAGNOSIS — R6889 Other general symptoms and signs: Secondary | ICD-10-CM | POA: Diagnosis not present

## 2021-06-15 LAB — RESP PANEL BY RT-PCR (FLU A&B, COVID) ARPGX2
Influenza A by PCR: NEGATIVE
Influenza B by PCR: NEGATIVE
SARS Coronavirus 2 by RT PCR: NEGATIVE

## 2021-06-15 LAB — CBC
HCT: 27.8 % — ABNORMAL LOW (ref 36.0–46.0)
Hemoglobin: 8.4 g/dL — ABNORMAL LOW (ref 12.0–15.0)
MCH: 28.9 pg (ref 26.0–34.0)
MCHC: 30.2 g/dL (ref 30.0–36.0)
MCV: 95.5 fL (ref 80.0–100.0)
Platelets: 556 10*3/uL — ABNORMAL HIGH (ref 150–400)
RBC: 2.91 MIL/uL — ABNORMAL LOW (ref 3.87–5.11)
RDW: 22 % — ABNORMAL HIGH (ref 11.5–15.5)
WBC: 10.1 10*3/uL (ref 4.0–10.5)
nRBC: 0.3 % — ABNORMAL HIGH (ref 0.0–0.2)

## 2021-06-15 LAB — URINALYSIS, ROUTINE W REFLEX MICROSCOPIC
Bacteria, UA: NONE SEEN
Bilirubin Urine: NEGATIVE
Glucose, UA: NEGATIVE mg/dL
Ketones, ur: NEGATIVE mg/dL
Leukocytes,Ua: NEGATIVE
Nitrite: NEGATIVE
Protein, ur: NEGATIVE mg/dL
Specific Gravity, Urine: 1.004 — ABNORMAL LOW (ref 1.005–1.030)
pH: 6 (ref 5.0–8.0)

## 2021-06-15 LAB — COMPREHENSIVE METABOLIC PANEL
ALT: 10 U/L (ref 0–44)
AST: 20 U/L (ref 15–41)
Albumin: 2.8 g/dL — ABNORMAL LOW (ref 3.5–5.0)
Alkaline Phosphatase: 74 U/L (ref 38–126)
Anion gap: 9 (ref 5–15)
BUN: 31 mg/dL — ABNORMAL HIGH (ref 8–23)
CO2: 21 mmol/L — ABNORMAL LOW (ref 22–32)
Calcium: 7.5 mg/dL — ABNORMAL LOW (ref 8.9–10.3)
Chloride: 105 mmol/L (ref 98–111)
Creatinine, Ser: 1.09 mg/dL — ABNORMAL HIGH (ref 0.44–1.00)
GFR, Estimated: 48 mL/min — ABNORMAL LOW (ref >60)
Glucose, Bld: 132 mg/dL — ABNORMAL HIGH (ref 70–99)
Potassium: 3.5 mmol/L (ref 3.5–5.1)
Sodium: 135 mmol/L (ref 135–145)
Total Bilirubin: 0.9 mg/dL (ref 0.3–1.2)
Total Protein: 6.4 g/dL — ABNORMAL LOW (ref 6.5–8.1)

## 2021-06-15 LAB — TROPONIN I (HIGH SENSITIVITY): Troponin I (High Sensitivity): 9 ng/L (ref <18)

## 2021-06-15 LAB — MAGNESIUM: Magnesium: 1.8 mg/dL (ref 1.7–2.4)

## 2021-06-15 MED ORDER — CALCIUM GLUCONATE-NACL 1-0.675 GM/50ML-% IV SOLN
1.0000 g | Freq: Once | INTRAVENOUS | Status: AC
Start: 2021-06-15 — End: 2021-06-15
  Administered 2021-06-15: 1000 mg via INTRAVENOUS
  Filled 2021-06-15: qty 50

## 2021-06-15 MED ORDER — SODIUM CHLORIDE 0.9 % IV BOLUS
500.0000 mL | Freq: Once | INTRAVENOUS | Status: AC
  Administered 2021-06-15: 500 mL via INTRAVENOUS

## 2021-06-15 NOTE — Telephone Encounter (Signed)
Noted. Dr Army Melia informed. ?

## 2021-06-15 NOTE — Discharge Instructions (Addendum)
The primary care doctor on Monday for follow-up.  Return to the ER for new, worsening, or persistent confusion or change in mental status, agitation, weakness, lethargy, fever, vomiting, or any other new or worsening symptoms that concern you. ?

## 2021-06-15 NOTE — Telephone Encounter (Signed)
Called pt daughter to let her know that pts need to bring in a urine sample to her appointment today at 1:20 if possible. ? ?[facility] nurse may give results to patient if they return call to clinic, a CRM has been created. ? ?KP ?

## 2021-06-15 NOTE — ED Notes (Signed)
Pt via EMS from home. Pt is at baseline, per daughter pt has been acting out more, not acting herself. Pt also has decreased in intake. Last time pt acted like this pt had a UTI. Pt is baseline at alert and oriented to self.  ? ?80/palp to 100/palp  ?22G L forearm  ?EMS gave 470m of fluid  ?85 HR  ?223 CBG  ?98.0 orally  ?

## 2021-06-15 NOTE — Telephone Encounter (Signed)
Daughter Arrie Aran calling in.  "I think I need to call the ambulance to take her to the ER".    She is very combative and argumentative with me.   This is very unusual for her.  One minute she will cooperate and then she tries to hit me and flail her arms around.   "I think I need to call the ambulance and take her to the ER".   She is telling me if I call the ambulance I won't be her daughter anymore.    I asked if this was her usual behavior and [name] said,  "No".   "This is not how she acts at all".   "This is very unlike her".   I advised her to call 911 and not worry about bringing her to the 1:20 appt here today.   Due to that not being her usual behavior she needs to be evaluated in the ED.  [name] was agreeable and is going to call now. ? ?I cancelled the 1:20 appt. ?Reason for Disposition ? Very strange or paranoid behavior ? ?Answer Assessment - Initial Assessment Questions ?1. LEVEL OF CONSCIOUSNESS: "How is he (she, the patient) acting right now?" (e.g., alert-oriented, confused, lethargic, stuporous, comatose) ?    Daughter Arrie Aran calling in that her mother has a 1:20 appt today with Dr. Army Melia but pt is combative and trying to hit [name] and flailing her arms around.  One minute she is cooperative then becomes combative.   Unable to get her to the appt today.   "I'm going to call the ambulance to take her to the ER".    "This is not her usual behavior". ?2. ONSET: "When did the confusion start?"  (minutes, hours, days) ?    Today ?3. PATTERN "Does this come and go, or has it been constant since it started?"  "Is it present now?" ?    Intermittent but she is combative now and trying to hit me by flailing her arms. ?4. ALCOHOL or DRUGS: "Has he been drinking alcohol or taking any drugs?"  ?    Not asked ?5. NARCOTIC MEDICATIONS: "Has he been receiving any narcotic medications?" (e.g., morphine, Vicodin) ?    Not asked ?6. CAUSE: "What do you think is causing the confusion?"  ?    Don't  know ?7. OTHER SYMPTOMS: "Are there any other symptoms?" (e.g., difficulty breathing, headache, fever, weakness) ?    [name] is calling 911 now. ? ?Protocols used: Confusion - Delirium-A-AH ? ?Chief Complaint: Pt is too combative and uncooperative to bring to 1:20 appt today to see Dr. Army Melia. ?Symptoms: Daughter is calling 911 because this is not mother's usual behavior at all ?Frequency: Now ?Pertinent Negatives: Patient denies N/A ?Disposition: '[x]'$ ED /'[]'$ Urgent Care (no appt availability in office) / '[]'$ Appointment(In office/virtual)/ '[]'$  [facility]/ '[]'$ Home Care/ '[]'$ Refused Recommended Disposition /'[]'$ [place] [facility]/ '[]'$  Follow-up with PCP ?Additional Notes: Arrie Aran, daughter calling 911 now due to mother's combative behavior.  ?

## 2021-06-15 NOTE — ED Notes (Signed)
Called to add on Mg level.  ?

## 2021-06-15 NOTE — ED Triage Notes (Signed)
Patient to ER from home with AMS.  ? ?Daughter with patient. Reports that she usually cares for patient but Monday- wednesday she was sick and unable to visit. Daughter states that when she did visit there was very little evidence that she has been eating or drinking. Patient possibly spent the last several days sleeping. Patient now acting out of character- daughter reports she is acting aggressively and making aggressive threats and remarks. Possible UTI. ? ?Patient usually ambulatory.  ? ?Patient has polycythemia and takes medications to suppress platelets. Patient has been without meds for four days.  ?

## 2021-06-15 NOTE — ED Provider Notes (Signed)
? ?[facility] ?Provider Note ? ? ? Event Date/Time  ? First MD Initiated Contact with Patient 06/15/21 1616   ?  (approximate) ? ? ?History  ? ?Altered Mental Status ? ? ?HPI ? ?Laura Wilcox is a 88 y.o. female with history of polycythemia vera and myelofibrosis as well as prior UTIs who presents with generalized weakness and altered mental status noted today.  The daughter notes that the patient has a history of short-term memory loss.  She lives independently but with a lot of assistance from family.  The patient's daughter normally takes care of her.  She had to go to the hospital a few days ago herself and was not able to see the patient.  Today when she went to see the patient it was apparent that the patient had not been eating or drinking or taking any of her medications over the last few days.  She has done this previously.  In addition the patient appeared more confused, agitated, and combative than baseline.  The patient herself denies any complaints currently. ? ? ? ? ?Physical Exam  ? ?Triage Vital Signs: ?ED Triage Vitals  ?Enc Vitals Group  ?   BP 06/15/21 1433 125/63  ?   Pulse Rate 06/15/21 1433 86  ?   Resp 06/15/21 1433 18  ?   Temp 06/15/21 1433 97.9 ?F (36.6 ?C)  ?   Temp Source 06/15/21 1433 Oral  ?   SpO2 06/15/21 1433 97 %  ?   Weight --   ?   Height 06/15/21 1440 '5\' 7"'$  (1.702 m)  ?   Head Circumference --   ?   Peak Flow --   ?   Pain Score --   ?   Pain Loc --   ?   Pain Edu? --   ?   Excl. in [place]? --   ? ? ?Most recent vital signs: ?Vitals:  ? 06/15/21 2215 06/15/21 2230  ?BP:  98/74  ?Pulse:  84  ?Resp: 18 18  ?Temp:    ?SpO2:  92%  ? ? ? ?General: Alert, mildly confused.  No distress. ?CV:  Good peripheral perfusion.  ?Resp:  Normal effort.  ?Abd:  Soft and nontender.  No distention.  ?Other:  EOMI.  PERRLA.  Mucous membranes slightly dry.  Motor intact in all extremities.  Neck supple, full range of motion.  No meningeal signs. ? ? ?ED Results / Procedures /  Treatments  ? ?Labs ?(all labs ordered are listed, but only abnormal results are displayed) ?Labs Reviewed  ?COMPREHENSIVE METABOLIC PANEL - Abnormal; Notable for the following components:  ?    Result Value  ? CO2 21 (*)   ? Glucose, Bld 132 (*)   ? BUN 31 (*)   ? Creatinine, Ser 1.09 (*)   ? Calcium 7.5 (*)   ? Total Protein 6.4 (*)   ? Albumin 2.8 (*)   ? GFR, Estimated 48 (*)   ? All other components within normal limits  ?CBC - Abnormal; Notable for the following components:  ? RBC 2.91 (*)   ? Hemoglobin 8.4 (*)   ? HCT 27.8 (*)   ? RDW 22.0 (*)   ? Platelets 556 (*)   ? nRBC 0.3 (*)   ? All other components within normal limits  ?URINALYSIS, ROUTINE W REFLEX MICROSCOPIC - Abnormal; Notable for the following components:  ? Color, Urine STRAW (*)   ? APPearance CLEAR (*)   ? Specific  Gravity, Urine 1.004 (*)   ? Hgb urine dipstick SMALL (*)   ? All other components within normal limits  ?RESP PANEL BY RT-PCR (FLU A&B, COVID) [id number]  ?MAGNESIUM  ?TROPONIN I (HIGH SENSITIVITY)  ? ? ? ?EKG ? ?ED ECG REPORT ?IArta Silence, the attending physician, personally viewed and interpreted this ECG. ? ?Date: 06/15/2021 ?EKG Time: 1627 ?Rate: 86 ?Rhythm: normal sinus rhythm ?QRS Axis: normal ?Intervals: normal ?ST/T Wave abnormalities: normal ?Narrative Interpretation: no evidence of acute ischemia ? ? ? ?RADIOLOGY ? ?CT head: I independently viewed and interpreted the images; there is no ICH or evidence of acute stroke ? ?PROCEDURES: ? ?Critical Care performed: No ? ?Procedures ? ? ?MEDICATIONS ORDERED IN ED: ?Medications  ?sodium chloride 0.9 % bolus 500 mL (0 mLs Intravenous Stopped 06/15/21 1900)  ?calcium gluconate 1 g/ 50 mL sodium chloride IVPB (1,000 mg Intravenous New Bag/Given 06/15/21 2230)  ? ? ? ?IMPRESSION / MDM / ASSESSMENT AND PLAN / ED COURSE  ?I reviewed the triage vital signs and the nursing notes. ? ?88 year old female with PMH as noted above presents with decreased p.o. intake and altered mental  status over the last 1 to 2 days.  The daughter is concerned for UTI. ? ?On exam the patient is overall well-appearing for her age with borderline low blood pressure but otherwise normal vital signs.  Neurologic exam is nonfocal.  Exam is otherwise unremarkable. ? ?Differential diagnosis includes, but is not limited to, dehydration, AKI, electrolyte abnormality, other metabolic cause, UTI or other infection, or less likely primary cardiac or CNS cause.  It could also be progression of dementia. ? ?We will obtain CT head, lab work-up, urinalysis, give a fluid bolus, and reassess. ? ?----------------------------------------- ?11:55 PM on 06/15/2021 ?----------------------------------------- ? ?CT head was negative for acute findings.  The urinalysis is normal.  Respiratory panel is also negative.  Troponin is negative.   ? ?CBC is significant for anemia and nucleated RBCs but this is baseline for the patient. ? ?CMP shows hypocalcemia, hypoalbuminemia and a minimally elevated creatinine.  We obtained a magnesium which is normal. ? ?On reassessment, the patient continues to be alert, is eating, and appears at her baseline per the daughter.  I had an extensive discussion with the daughter about the plan of care.  Given the normal urinalysis and no specific findings to explain the patient's change in mental status and behavior (other than decreased p.o. intake and dehydration) I did consider and discuss with the daughter whether it may be helpful to admit the patient for further observation.  However, the daughter expresses a strong preference that the patient go home if at all possible given that she has previously had delirium and deteriorated when admitted to the hospital. ? ?I ordered repletion of the calcium, however on further review of the work-up, I suspect that the calcium is only borderline low since the patient has a low albumin, likely due to her decreased p.o. intake.  Therefore, we stopped the calcium  infusion after it was about two thirds finished. ? ?At this time, the patient is stable for discharge home.  I counseled the daughter extensively on the results of the work-up.  I recommended that they follow-up with the primary care doctor early next week and have labs redrawn.  I gave them thorough return precautions, and the daughter expressed understanding. ? ? ?FINAL CLINICAL IMPRESSION(S) / ED DIAGNOSES  ? ?Final diagnoses:  ?Altered mental status, unspecified altered mental status type  ?Dehydration  ?  Hypoalbuminemia  ? ? ? ?Rx / DC Orders  ? ?ED Discharge Orders   ? ? None  ? ?  ? ? ? ?Note:  This document was prepared using Dragon voice recognition software and may include unintentional dictation errors.  ?  Arta Silence, MD ?06/15/21 2358 ? ?

## 2021-06-15 NOTE — ED Notes (Signed)
Pt was assisted to the bathroom. Pt was unsteady and had trouble getting off the toilet. ?

## 2021-06-18 ENCOUNTER — Telehealth: Payer: Self-pay | Admitting: *Deleted

## 2021-06-18 ENCOUNTER — Encounter: Payer: Self-pay | Admitting: Internal Medicine

## 2021-06-18 ENCOUNTER — Other Ambulatory Visit: Payer: Self-pay

## 2021-06-18 ENCOUNTER — Ambulatory Visit (INDEPENDENT_AMBULATORY_CARE_PROVIDER_SITE_OTHER): Payer: Medicare Other | Admitting: Internal Medicine

## 2021-06-18 VITALS — BP 104/60 | HR 88 | Ht 67.0 in | Wt 108.0 lb

## 2021-06-18 DIAGNOSIS — D61818 Other pancytopenia: Secondary | ICD-10-CM

## 2021-06-18 DIAGNOSIS — N1832 Chronic kidney disease, stage 3b: Secondary | ICD-10-CM

## 2021-06-18 DIAGNOSIS — D631 Anemia in chronic kidney disease: Secondary | ICD-10-CM

## 2021-06-18 DIAGNOSIS — D45 Polycythemia vera: Secondary | ICD-10-CM | POA: Diagnosis not present

## 2021-06-18 DIAGNOSIS — R4181 Age-related cognitive decline: Secondary | ICD-10-CM

## 2021-06-18 DIAGNOSIS — E440 Moderate protein-calorie malnutrition: Secondary | ICD-10-CM

## 2021-06-18 DIAGNOSIS — N2581 Secondary hyperparathyroidism of renal origin: Secondary | ICD-10-CM

## 2021-06-18 NOTE — Telephone Encounter (Signed)
Josh, are you ok with adding patient to your schedule on Wed?  ?

## 2021-06-18 NOTE — Telephone Encounter (Signed)
Daughter called stating hat patient went to ER Friday and was told to contact doctor to be seen Monday and was not sure who to contact, I told her that per ER note she was to call Dr Carolin Coy today, which she said she has done and has an appointment this afternoon, but she is not sure that she can get patient to that ppt as she is not ambulatory. She then asked about the patient appointment with Dr Tasia Catchings Wednesday and how important getting labs is. I explained that her last hgb was 8.4 when she was deemed dehydrated and that she is probably lower than that since she is now rehydrated. She is going to do her best to get patient to her appointment with PCP today and have labs checked and will contact us after that to see if patient might need to come in earlier than Wed. She also discussed that she may want to speak with Palliative Care Sharion Dove, NP again.  ?

## 2021-06-18 NOTE — Progress Notes (Signed)
? ? ?Date:  06/18/2021  ? ?Name:  Laura Wilcox   DOB:  April 28, 1926   MRN:  [id number] ? ? ?Chief Complaint: Follow-up (ER follow up ) ?ER follow up.  Seen at [facility] on 06/15/21 with AMS and mild dehydration.  Labs were mildly abnormal.  Calcium was low and she received 2/3 of an IV infusion.  Mg++ was normal.   Troponin was normal.  EKG showed no ischemia. CT of the head showed no acute abnormality.  UA was normal. ?She was given 500 cc NS. ? ?She is here today with her daughter [name], her sole caregiver.  She elected not have her mother admitted for observation as that tends to make her more agitated. ?Since being home she is eating some and drinking about 5 glasses of fluid per day.  She is not nearly as active - spending all her time in bed and even refusing to get up to eat.  She needs maximal assistance to get out of bed to the toilet.  Previously would sit in her chair and participate in outings to the grocery store. ? ?[name] is very stressed about her mother.  She spends all of her days with her and goes home at night to her husband.  Her mother does not want to live with her and [name] is not sure she could handle the negativity - this brings up too many bad memories of an unhappy childhood. ?Desire does not want to live with [name] and does not acknowledge that she is doing poorly at home.  Palliative care consults were set up several times in the recent past but Emer would fight and argue and refuse to cooperate so the visits were called off. ?Currently she is still seeing Hematology and has an appointment Wednesday.  They have a PC liason who will be willing to meet with her at that time. ?We discussed nutrition - Oneisha does not like milk shakes and Boost is too sweet. She is trying to feed her eggs and other higher protein foods. ? ?Lab Results  ?Component Value Date  ? NA 135 06/15/2021  ? K 3.5 06/15/2021  ? CO2 21 (L) 06/15/2021  ? GLUCOSE 132 (H) 06/15/2021  ? BUN 31 (H) 06/15/2021  ?  CREATININE 1.09 (H) 06/15/2021  ? CALCIUM 7.5 (L) 06/15/2021  ? GFRNONAA 48 (L) 06/15/2021  ? ?Lab Results  ?Component Value Date  ? CHOL 279 (H) 05/27/2018  ? HDL 72 05/27/2018  ? LDLCALC 182 (H) 05/27/2018  ? TRIG 125 05/27/2018  ? CHOLHDL 3.9 05/27/2018  ? ?Lab Results  ?Component Value Date  ? TSH 3.297 11/16/2020  ? ?No results found for: HGBA1C ?Lab Results  ?Component Value Date  ? WBC 10.1 06/15/2021  ? HGB 8.4 (L) 06/15/2021  ? HCT 27.8 (L) 06/15/2021  ? MCV 95.5 06/15/2021  ? PLT 556 (H) 06/15/2021  ? ?Lab Results  ?Component Value Date  ? ALT 10 06/15/2021  ? AST 20 06/15/2021  ? ALKPHOS 74 06/15/2021  ? BILITOT 0.9 06/15/2021  ? ?No results found for: 25OHVITD2, [place], VD25OH  ? ?Review of Systems  ?Constitutional:  Positive for chills and fatigue. Negative for fever and unexpected weight change.  ?HENT:  Negative for trouble swallowing.   ?Respiratory:  Positive for cough. Negative for chest tightness, shortness of breath and wheezing.   ?Cardiovascular:  Negative for chest pain and leg swelling.  ?Gastrointestinal:  Negative for abdominal pain.  ?Musculoskeletal:  Positive for gait problem. Negative for arthralgias  and myalgias.  ?Neurological:  Negative for dizziness, light-headedness and headaches.  ?Psychiatric/Behavioral:  Positive for agitation, confusion and decreased concentration.   ? ?Patient Active Problem List  ? Diagnosis Date Noted  ? Myelofibrosis ([place]) 01/31/2020  ? Other pancytopenia ([place]) 01/31/2020  ? Moderate protein-calorie malnutrition ([place])   ? Squamous cell cancer of skin of left cheek 01/16/2020  ? Chronic kidney disease, stage 3b ([place]) 03/11/2019  ? Secondary hyperparathyroidism of renal origin ([place]) 03/11/2019  ? Anemia in chronic kidney disease 03/11/2019  ? Generalized weakness 12/17/2018  ? Elevated ferritin 02/18/2018  ? Goals of care, counseling/discussion 07/09/2017  ? Polycythemia rubra vera ([place]) 06/02/2017  ? Underweight 06/02/2017  ? Advanced care  planning/counseling discussion 06/02/2017  ? Age-related cognitive decline 06/02/2017  ? ? ?Allergies  ?Allergen Reactions  ? Bactrim [Sulfamethoxazole-Trimethoprim]   ?  Due to kidney disease   ? Epinephrine Other (See Comments)  ? ? ?Past Surgical History:  ?Procedure Laterality Date  ? ABDOMINAL HYSTERECTOMY    ? complete  ? BREAST SURGERY    ? ? ?Social History  ? ?Tobacco Use  ? Smoking status: Never  ? Smokeless tobacco: Never  ?Vaping Use  ? Vaping Use: Never used  ?Substance Use Topics  ? Alcohol use: No  ?  Comment: occasional glass of wine  ? Drug use: No  ? ? ? ?Medication list has been reviewed and updated. ? ?No outpatient medications have been marked as taking for the 06/18/21 encounter (Office Visit) with Glean Hess, MD.  ? ? ?PHQ 2/9 Scores 06/18/2021 03/30/2021 09/19/2020 07/04/2020  ?PHQ - 2 Score 3 0 0 0  ?PHQ- 9 Score 11 3 0 0  ? ? ?GAD 7 : Generalized Anxiety Score 06/18/2021 03/30/2021 09/19/2020 07/04/2020  ?Nervous, Anxious, on Edge 0 0 0 0  ?Control/stop worrying 0 0 0 0  ?Worry too much - different things 0 0 0 0  ?Trouble relaxing 0 0 0 0  ?Restless 0 0 0 0  ?Easily annoyed or irritable 0 0 0 0  ?Afraid - awful might happen 0 0 0 0  ?Total GAD 7 Score 0 0 0 0  ?Anxiety Difficulty Not difficult at all Not difficult at all - -  ? ? ?BP Readings from Last 3 Encounters:  ?06/18/21 104/60  ?06/15/21 98/74  ?05/23/21 127/68  ? ? ?Physical Exam ?Constitutional:   ?   General: She is not in acute distress. ?Cardiovascular:  ?   Rate and Rhythm: Normal rate and regular rhythm.  ?   Pulses: Normal pulses.  ?   Heart sounds: No murmur heard. ?Pulmonary:  ?   Effort: Pulmonary effort is normal. No respiratory distress.  ?   Breath sounds: No wheezing, rhonchi or rales.  ?   Comments: Loose cough occasionally ?Musculoskeletal:  ?   Cervical back: Normal range of motion.  ?Lymphadenopathy:  ?   Cervical: No cervical adenopathy.  ?Skin: ?   General: Skin is warm and dry.  ?   Capillary Refill: Capillary  refill takes less than 2 seconds.  ?   Coloration: Skin is pale.  ?   Findings: Lesion (left cheek) present.  ?Neurological:  ?   Mental Status: She is alert.  ?   Comments: Oriented x 2 - very HOH  ?Psychiatric:     ?   Mood and Affect: Mood normal.  ? ? ?Wt Readings from Last 3 Encounters:  ?06/18/21 108 lb (49 kg)  ?06/15/21 110 lb (49.9 kg)  ?  04/25/21 112 lb 1.6 oz (50.8 kg)  ? ? ?BP 104/60   Pulse 88   Ht '5\' 7"'$  (1.702 m)   Wt 108 lb (49 kg)   SpO2 94%   BMI 16.92 kg/m?  ? ?Assessment and Plan: ?1. Age-related cognitive decline ?She appears at baseline mentally but physically declining ?She is requiring more care than previously without an obvious acute process. ?Daughter continues to care for her every day for 8-10 hours. ?We discussed palliative care as a possible option. ? ?2. Moderate protein-calorie malnutrition ([place]) ?Daughter will continue to provide meals; try other supplements ?- Comprehensive metabolic panel ?- Magnesium ?- TSH ? ?3. Polycythemia rubra vera ([place]) ?Followed by Hematology/Oncology ?- CBC with Differential/Platelet ? ?4. Anemia in stage 3b chronic kidney disease ([place]) ?Repeat CBC following IV hydration ?- CBC with Differential/Platelet ? ?5. Other pancytopenia ([place]) ?- CBC with Differential/Platelet ? ?6. Secondary hyperparathyroidism of renal origin [facility]) ?This is being monitored by Nephrology ? ? ?Partially dictated using Editor, commissioning. Any errors are unintentional. ? ?Halina Maidens, MD ?[facility] ?[facility] ? ?06/18/2021 ? ? ? ? ?

## 2021-06-19 LAB — COMPREHENSIVE METABOLIC PANEL
ALT: 9 IU/L (ref 0–32)
AST: 14 IU/L (ref 0–40)
Albumin/Globulin Ratio: 1.6 (ref 1.2–2.2)
Albumin: 3.6 g/dL (ref 3.5–4.6)
Alkaline Phosphatase: 87 IU/L (ref 44–121)
BUN/Creatinine Ratio: 16 (ref 12–28)
BUN: 19 mg/dL (ref 10–36)
Bilirubin Total: 0.3 mg/dL (ref 0.0–1.2)
CO2: 23 mmol/L (ref 20–29)
Calcium: 8.5 mg/dL — ABNORMAL LOW (ref 8.7–10.3)
Chloride: 98 mmol/L (ref 96–106)
Creatinine, Ser: 1.18 mg/dL — ABNORMAL HIGH (ref 0.57–1.00)
Globulin, Total: 2.2 g/dL (ref 1.5–4.5)
Glucose: 108 mg/dL — ABNORMAL HIGH (ref 70–99)
Potassium: 4.2 mmol/L (ref 3.5–5.2)
Sodium: 134 mmol/L (ref 134–144)
Total Protein: 5.8 g/dL — ABNORMAL LOW (ref 6.0–8.5)
eGFR: 43 mL/min/{1.73_m2} — ABNORMAL LOW (ref >59)

## 2021-06-19 LAB — CBC WITH DIFFERENTIAL/PLATELET
Basophils Absolute: 0.3 10*3/uL — ABNORMAL HIGH (ref 0.0–0.2)
Basos: 3 %
EOS (ABSOLUTE): 0.4 10*3/uL (ref 0.0–0.4)
Eos: 4 %
Hematocrit: 25.2 % — ABNORMAL LOW (ref 34.0–46.6)
Hemoglobin: 8.1 g/dL — ABNORMAL LOW (ref 11.1–15.9)
Lymphocytes Absolute: 1 10*3/uL (ref 0.7–3.1)
Lymphs: 10 %
MCH: 29.2 pg (ref 26.6–33.0)
MCHC: 32.1 g/dL (ref 31.5–35.7)
MCV: 91 fL (ref 79–97)
Monocytes Absolute: 1 10*3/uL — ABNORMAL HIGH (ref 0.1–0.9)
Monocytes: 10 %
Neutrophils Absolute: 6.5 10*3/uL (ref 1.4–7.0)
Neutrophils: 66 %
Platelets: 452 10*3/uL — ABNORMAL HIGH (ref 150–450)
RBC: 2.77 x10E6/uL — ABNORMAL LOW (ref 3.77–5.28)
RDW: 20.1 % — ABNORMAL HIGH (ref 11.7–15.4)
WBC: 9.9 10*3/uL (ref 3.4–10.8)

## 2021-06-19 LAB — IMMATURE CELLS
MYELOCYTES: 4 % — ABNORMAL HIGH (ref 0–0)
Metamyelocytes: 3 % — ABNORMAL HIGH (ref 0–0)

## 2021-06-19 LAB — TSH: TSH: 2.36 u[IU]/mL (ref 0.450–4.500)

## 2021-06-19 LAB — MAGNESIUM: Magnesium: 1.7 mg/dL (ref 1.6–2.3)

## 2021-06-20 ENCOUNTER — Inpatient Hospital Stay: Payer: Medicare Other

## 2021-06-20 ENCOUNTER — Ambulatory Visit: Payer: PRIVATE HEALTH INSURANCE

## 2021-06-20 ENCOUNTER — Inpatient Hospital Stay: Payer: Medicare Other | Attending: Oncology | Admitting: Hospice and Palliative Medicine

## 2021-06-20 ENCOUNTER — Other Ambulatory Visit: Payer: PRIVATE HEALTH INSURANCE

## 2021-06-20 ENCOUNTER — Other Ambulatory Visit: Payer: Self-pay

## 2021-06-20 VITALS — BP 113/65 | HR 87 | Temp 99.9°F | Resp 16 | Wt 109.0 lb

## 2021-06-20 DIAGNOSIS — N1832 Chronic kidney disease, stage 3b: Secondary | ICD-10-CM | POA: Diagnosis not present

## 2021-06-20 DIAGNOSIS — Z515 Encounter for palliative care: Secondary | ICD-10-CM

## 2021-06-20 DIAGNOSIS — Z79899 Other long term (current) drug therapy: Secondary | ICD-10-CM | POA: Insufficient documentation

## 2021-06-20 DIAGNOSIS — R531 Weakness: Secondary | ICD-10-CM | POA: Diagnosis not present

## 2021-06-20 DIAGNOSIS — F419 Anxiety disorder, unspecified: Secondary | ICD-10-CM | POA: Diagnosis not present

## 2021-06-20 DIAGNOSIS — D45 Polycythemia vera: Secondary | ICD-10-CM

## 2021-06-20 DIAGNOSIS — D7581 Myelofibrosis: Secondary | ICD-10-CM | POA: Diagnosis not present

## 2021-06-20 DIAGNOSIS — D631 Anemia in chronic kidney disease: Secondary | ICD-10-CM

## 2021-06-20 DIAGNOSIS — G9341 Metabolic encephalopathy: Secondary | ICD-10-CM | POA: Insufficient documentation

## 2021-06-20 DIAGNOSIS — G319 Degenerative disease of nervous system, unspecified: Secondary | ICD-10-CM | POA: Insufficient documentation

## 2021-06-20 LAB — CBC WITH DIFFERENTIAL/PLATELET
Abs Immature Granulocytes: 1.23 10*3/uL — ABNORMAL HIGH (ref 0.00–0.07)
Basophils Absolute: 0.1 10*3/uL (ref 0.0–0.1)
Basophils Relative: 1 %
Eosinophils Absolute: 0.4 10*3/uL (ref 0.0–0.5)
Eosinophils Relative: 4 %
HCT: 26.5 % — ABNORMAL LOW (ref 36.0–46.0)
Hemoglobin: 8.2 g/dL — ABNORMAL LOW (ref 12.0–15.0)
Immature Granulocytes: 12 %
Lymphocytes Relative: 8 %
Lymphs Abs: 0.9 10*3/uL (ref 0.7–4.0)
MCH: 29.4 pg (ref 26.0–34.0)
MCHC: 30.9 g/dL (ref 30.0–36.0)
MCV: 95 fL (ref 80.0–100.0)
Monocytes Absolute: 1.2 10*3/uL — ABNORMAL HIGH (ref 0.1–1.0)
Monocytes Relative: 11 %
Neutro Abs: 6.5 10*3/uL (ref 1.7–7.7)
Neutrophils Relative %: 64 %
Platelets: 483 10*3/uL — ABNORMAL HIGH (ref 150–400)
RBC: 2.79 MIL/uL — ABNORMAL LOW (ref 3.87–5.11)
RDW: 22.6 % — ABNORMAL HIGH (ref 11.5–15.5)
Smear Review: NORMAL
WBC: 10.3 10*3/uL (ref 4.0–10.5)
nRBC: 0.3 % — ABNORMAL HIGH (ref 0.0–0.2)

## 2021-06-20 MED ORDER — CITALOPRAM HYDROBROMIDE 10 MG PO TABS
10.0000 mg | ORAL_TABLET | Freq: Every day | ORAL | 2 refills | Status: DC
Start: 2021-06-20 — End: 2021-10-17

## 2021-06-20 MED ORDER — EPOETIN ALFA-EPBX 10000 UNIT/ML IJ SOLN
10000.0000 [IU] | Freq: Once | INTRAMUSCULAR | Status: AC
  Administered 2021-06-20: 10000 [IU] via SUBCUTANEOUS
  Filled 2021-06-20: qty 1

## 2021-06-20 NOTE — Progress Notes (Signed)
? ?  ?Palliative Medicine ?[place] at [facility] ?Telephone:([phone]) [phone] Fax:(336) [phone] ? ? ?Name: Laura Wilcox ?Date: 06/20/2021 ?MRN: [id number]  ?DOB: 09-Nov-1926 ? ?Patient Care Team: ?Glean Hess, MD as PCP - General (Internal Medicine) ?Anthonette Legato, MD (Internal Medicine) ?Glean Hess, MD (Internal Medicine)  ? ? ?REASON FOR CONSULTATION: ?Laura Wilcox is a 88 y.o. female with multiple medical problems including memory impairment/probable dementia and polycythemia vera and secondary myelofibrosis on [place].  Patient was referred to palliative care to help address goals in the context of worsening confusion/memory issues, anxiety, and agitation. ? ?SOCIAL HISTORY:    ? reports that she has never smoked. She has never used smokeless tobacco. She reports that she does not drink alcohol and does not use drugs. ? ?Patient is widowed.  Patient lives at home alone.  She has a daughter who is her primary caregiver.  She has a son who lives in [place]. ? ?ADVANCE DIRECTIVES:  ?None on file ? ?CODE STATUS:  ? ?PAST MEDICAL HISTORY: ?Past Medical History:  ?Diagnosis Date  ? Acute metabolic encephalopathy 35/32/9924  ? Confusion 12/30/2018  ? Polycythemia   ? SCC (squamous cell carcinoma) 04/11/2021  ? R med dorsum hand - ED&C SCCIS  ? Squamous cell carcinoma of skin 09/06/2020  ? R thumb - ED&C  ? Urinary tract infection without hematuria 01/02/2020  ? Urticaria 06/02/2017  ? ? ?PAST SURGICAL HISTORY:  ?Past Surgical History:  ?Procedure Laterality Date  ? ABDOMINAL HYSTERECTOMY    ? complete  ? BREAST SURGERY    ? ? ?HEMATOLOGY/ONCOLOGY HISTORY:  ?Oncology History Overview Note  ?[name] is a 88 y.o. female with polycythemia rubra vera and secondary myelofibrosis.  She presented in 04/2002 with thrombocytosis (835,000).  JAK2 V617F was positive on 05/20/2012. ?  ?Over the past year, hematocrit has ranged 37.5 - 42.4, hemoglobin 12.1 - 13.4,  platelets 458,000 - 569,000, WBC 10,500 - 15,700.  Creatinine has been 1.0 - 1.2. ?  ?Bone marrow  on 04/15/2002 revealed a normocellular marrow for age with trilineage hematopoiesis and mild megakaryocytic hyperplasia with focal atypia.  There was no morphologic evidence of a myeloproliferative disorder.  Iron stains were inadequate for evaluation of storage iron.  There was no increase in reticulin fibers.  She has been on agrylin since 2012. ?  ?Bone marrow on 09/25/2012 revealed a persistent myeloproliferative disorder s/p Agrylin.  The current features were best classified as persistent myeloproliferative neoplasm (MPN) JAK2 V617F mutation positive.  Marrow was hypercellular for age (30-50%) with increased trilineage hematopoiesis and markedly increased atypical megakaryopoiesis with frequent clustering.  There were no increased blasts.  Iron stores were absent.  There was moderate to severe myelofibrosis (grade MF 2-3).  Differential included ET, PV, primary myelofibrosis, and MPN, unclassifiable.  Cytogenetics were normal (103, XX). ?  ?She developed pancytopenia in 12/2015.  Etiology is unclear (medication confusion or hydroxyurea + Agrylin). ?  ?She received Procrit 40,000 units (intermittently 09/2012 - 06/2016), Venofer (09/2012 - 10/2012), and Infed (12/2015 - 03/2016).  She received Neupogen during a period of neutropenia. She was anagrelide from 2004 - 08/13/2017.  She began [place] on 08/14/2017.  Jakafi was increased from 5 mg BID to 10 mg BID on 06/17/2018.  Jakafi was increased to 15 mg BID on 08/03/2018. ?  ?She began a phlebotomy program on 08/06/2017.  She undergoes small volume (150 cc with replacement of 150 cc NS) if her HCT > 42. ?  ?Ferritin  has been followed: 529 on 09/03/2017, 1020 on 12/03/2017, 423 on 02/18/2018, and 858 on 11/17/2018.  Iron saturation was 37% and TIBC 270 on 11/17/2018.  B12 was 477 and folate 36 on 11/17/2018. ?  ?She was [facility] to [facility] from 12/17/2018 - 12/18/2018.   She received 1 unit of PRBCs.  CBC at discharge included a hematocrit of 29.0, hemoglobin 9.4, platelets 364,000, and WBC 10,200.  ?  ?Polycythemia rubra vera ([place])  ?06/02/2017 Initial Diagnosis  ? Polycythemia rubra vera ([place]) ?  ? ? ?ALLERGIES:  is allergic to bactrim [sulfamethoxazole-trimethoprim] and epinephrine. ? ?MEDICATIONS:  ?Current Outpatient Medications  ?Medication Sig Dispense Refill  ? ruxolitinib phosphate (JAKAFI) 10 MG tablet Take 1 tablet (10 mg total) by mouth 2 (two) times daily. 60 tablet 2  ? acetaminophen (TYLENOL) 325 MG tablet Take 325-650 mg by mouth every 6 (six) hours as needed for mild pain or moderate pain. (Patient not taking: Reported on 06/18/2021)    ? aspirin EC 81 MG tablet Take 1 tablet (81 mg total) by mouth daily. Swallow whole. (Patient not taking: Reported on 06/18/2021) 30 tablet 0  ? calcitRIOL (ROCALTROL) 0.25 MCG capsule Take 0.25 mcg by mouth every Monday, Wednesday, and Friday. (Patient not taking: Reported on 06/18/2021)    ? fluorouracil (EFUDEX) 5 % cream Apply to lesion on the right lower leg BID x 1 week. Take a 3 week break then repeat treatment. Do this for 3 rounds. (Patient not taking: Reported on 06/18/2021) 15 g 2  ? fluorouracil (EFUDEX) 5 % cream Apply topically 2 (two) times daily. Bid for 7 days to left cheek (Patient not taking: Reported on 06/18/2021) 15 g 1  ? mupirocin ointment (BACTROBAN) 2 % Apply 1 application topically daily. Qd to wound on hand (Patient not taking: Reported on 06/18/2021) 22 g 1  ? ?No current facility-administered medications for this visit.  ? ? ?VITAL SIGNS: ?BP 113/65   Pulse 87   Temp 99.9 ?F (37.7 ?C) (Tympanic)   Resp 16   Wt 109 lb (49.4 kg)   SpO2 97%   BMI 17.07 kg/m?  ?Filed Weights  ? 06/20/21 1319  ?Weight: 109 lb (49.4 kg)  ?  ?Estimated body mass index is 17.07 kg/m? as calculated from the following: ?  Height as of 06/18/21: '5\' 7"'$  (1.702 m). ?  Weight as of this encounter: 109 lb (49.4 kg). ? ?LABS: ?CBC: ?    ?Component Value Date/Time  ? WBC 10.3 06/20/2021 1245  ? HGB 8.2 (L) 06/20/2021 1245  ? HGB 8.1 (L) 06/18/2021 1514  ? HCT 26.5 (L) 06/20/2021 1245  ? HCT 25.2 (L) 06/18/2021 1514  ? PLT 483 (H) 06/20/2021 1245  ? PLT 452 (H) 06/18/2021 1514  ? MCV 95.0 06/20/2021 1245  ? MCV 91 06/18/2021 1514  ? NEUTROABS 6.5 06/20/2021 1245  ? NEUTROABS 6.5 06/18/2021 1514  ? LYMPHSABS 0.9 06/20/2021 1245  ? LYMPHSABS 1.0 06/18/2021 1514  ? MONOABS 1.2 (H) 06/20/2021 1245  ? EOSABS 0.4 06/20/2021 1245  ? EOSABS 0.4 06/18/2021 1514  ? BASOSABS 0.1 06/20/2021 1245  ? BASOSABS 0.3 (H) 06/18/2021 1514  ? ?Comprehensive Metabolic Panel: ?   ?Component Value Date/Time  ? NA 134 06/18/2021 1514  ? K 4.2 06/18/2021 1514  ? CL 98 06/18/2021 1514  ? CO2 23 06/18/2021 1514  ? BUN 19 06/18/2021 1514  ? CREATININE 1.18 (H) 06/18/2021 1514  ? GLUCOSE 108 (H) 06/18/2021 1514  ? GLUCOSE 132 (H) 06/15/2021 1442  ?  CALCIUM 8.5 (L) 06/18/2021 1514  ? AST 14 06/18/2021 1514  ? ALT 9 06/18/2021 1514  ? ALKPHOS 87 06/18/2021 1514  ? BILITOT 0.3 06/18/2021 1514  ? PROT 5.8 (L) 06/18/2021 1514  ? ALBUMIN 3.6 06/18/2021 1514  ? ? ?RADIOGRAPHIC STUDIES: ?CT Head Wo Contrast ? ?Result Date: 06/15/2021 ?CLINICAL DATA:  Mental status change.  Unknown cause. EXAM: CT HEAD WITHOUT CONTRAST TECHNIQUE: Contiguous axial images were obtained from the base of the skull through the vertex without intravenous contrast. RADIATION DOSE REDUCTION: This exam was performed according to the departmental dose-optimization program which includes automated exposure control, adjustment of the mA and/or kV according to patient size and/or use of iterative reconstruction technique. COMPARISON:  CT brain 12/17/2018 FINDINGS: Brain: There is mild cortical atrophy, within normal limits for patient age. The ventricles are normal in configuration. The basilar cisterns are patent. No mass, mass effect, or midline shift. No acute intracranial hemorrhage is seen. No abnormal  extra-axial fluid collection. Mild-to-moderate periventricular white matter hypodensities, similar to prior and likely chronic ischemic white matter changes. Preservation of the normal cortical gray-white interface without CT evid

## 2021-06-21 ENCOUNTER — Telehealth: Payer: Self-pay | Admitting: *Deleted

## 2021-06-21 ENCOUNTER — Encounter: Payer: Self-pay | Admitting: Hospice and Palliative Medicine

## 2021-06-21 ENCOUNTER — Other Ambulatory Visit (HOSPITAL_COMMUNITY): Payer: Self-pay

## 2021-06-21 NOTE — Telephone Encounter (Signed)
Daughter called reporting that patient saw J Borders yesterday and that while he was nice, he did not go into depth regarding CBC results. He is requesting Dr Tasia Catchings look at results and either her or her nurse call her back. She also reports that today, the patient is not doing well. She has developed nausea, not eating, diarrhea, sleeping all day and has a temp of 99.6. Please advise ?

## 2021-06-25 ENCOUNTER — Encounter: Payer: Self-pay | Admitting: Licensed Clinical Social Worker

## 2021-06-25 ENCOUNTER — Emergency Department: Payer: Medicare Other

## 2021-06-25 ENCOUNTER — Ambulatory Visit: Payer: PRIVATE HEALTH INSURANCE | Admitting: Internal Medicine

## 2021-06-25 ENCOUNTER — Inpatient Hospital Stay
Admission: EM | Admit: 2021-06-25 | Discharge: 2021-07-02 | DRG: 689 | Disposition: A | Discharge status: Skilled Nursing Facility | Payer: Medicare Other | Attending: Internal Medicine | Admitting: Internal Medicine

## 2021-06-25 ENCOUNTER — Other Ambulatory Visit: Payer: Self-pay

## 2021-06-25 DIAGNOSIS — N390 Urinary tract infection, site not specified: Principal | ICD-10-CM | POA: Diagnosis present

## 2021-06-25 DIAGNOSIS — N189 Chronic kidney disease, unspecified: Secondary | ICD-10-CM | POA: Diagnosis present

## 2021-06-25 DIAGNOSIS — R531 Weakness: Secondary | ICD-10-CM | POA: Diagnosis not present

## 2021-06-25 DIAGNOSIS — D75838 Other thrombocytosis: Secondary | ICD-10-CM | POA: Diagnosis present

## 2021-06-25 DIAGNOSIS — Z681 Body mass index (BMI) 19 or less, adult: Secondary | ICD-10-CM

## 2021-06-25 DIAGNOSIS — Z90710 Acquired absence of both cervix and uterus: Secondary | ICD-10-CM | POA: Diagnosis not present

## 2021-06-25 DIAGNOSIS — N1832 Chronic kidney disease, stage 3b: Secondary | ICD-10-CM | POA: Diagnosis present

## 2021-06-25 DIAGNOSIS — Z20822 Contact with and (suspected) exposure to covid-19: Secondary | ICD-10-CM | POA: Diagnosis present

## 2021-06-25 DIAGNOSIS — B961 Klebsiella pneumoniae [K. pneumoniae] as the cause of diseases classified elsewhere: Secondary | ICD-10-CM | POA: Diagnosis present

## 2021-06-25 DIAGNOSIS — E43 Unspecified severe protein-calorie malnutrition: Secondary | ICD-10-CM | POA: Diagnosis present

## 2021-06-25 DIAGNOSIS — F0393 Unspecified dementia, unspecified severity, with mood disturbance: Secondary | ICD-10-CM | POA: Diagnosis present

## 2021-06-25 DIAGNOSIS — I129 Hypertensive chronic kidney disease with stage 1 through stage 4 chronic kidney disease, or unspecified chronic kidney disease: Secondary | ICD-10-CM | POA: Diagnosis present

## 2021-06-25 DIAGNOSIS — F0392 Unspecified dementia, unspecified severity, with psychotic disturbance: Secondary | ICD-10-CM | POA: Diagnosis not present

## 2021-06-25 DIAGNOSIS — Z85828 Personal history of other malignant neoplasm of skin: Secondary | ICD-10-CM

## 2021-06-25 DIAGNOSIS — N2581 Secondary hyperparathyroidism of renal origin: Secondary | ICD-10-CM | POA: Diagnosis present

## 2021-06-25 DIAGNOSIS — Z66 Do not resuscitate: Secondary | ICD-10-CM | POA: Diagnosis present

## 2021-06-25 DIAGNOSIS — E440 Moderate protein-calorie malnutrition: Secondary | ICD-10-CM | POA: Diagnosis not present

## 2021-06-25 DIAGNOSIS — E860 Dehydration: Secondary | ICD-10-CM | POA: Diagnosis present

## 2021-06-25 DIAGNOSIS — E876 Hypokalemia: Secondary | ICD-10-CM | POA: Diagnosis not present

## 2021-06-25 DIAGNOSIS — D631 Anemia in chronic kidney disease: Secondary | ICD-10-CM | POA: Diagnosis not present

## 2021-06-25 DIAGNOSIS — D45 Polycythemia vera: Secondary | ICD-10-CM | POA: Diagnosis not present

## 2021-06-25 DIAGNOSIS — F32A Depression, unspecified: Secondary | ICD-10-CM | POA: Diagnosis not present

## 2021-06-25 DIAGNOSIS — F0390 Unspecified dementia without behavioral disturbance: Secondary | ICD-10-CM | POA: Diagnosis present

## 2021-06-25 DIAGNOSIS — Z8249 Family history of ischemic heart disease and other diseases of the circulatory system: Secondary | ICD-10-CM | POA: Diagnosis not present

## 2021-06-25 DIAGNOSIS — J9811 Atelectasis: Secondary | ICD-10-CM | POA: Diagnosis not present

## 2021-06-25 DIAGNOSIS — Z803 Family history of malignant neoplasm of breast: Secondary | ICD-10-CM | POA: Diagnosis not present

## 2021-06-25 LAB — BASIC METABOLIC PANEL
Anion gap: 12 (ref 5–15)
BUN: 20 mg/dL (ref 8–23)
CO2: 28 mmol/L (ref 22–32)
Calcium: 8.8 mg/dL — ABNORMAL LOW (ref 8.9–10.3)
Chloride: 95 mmol/L — ABNORMAL LOW (ref 98–111)
Creatinine, Ser: 1.14 mg/dL — ABNORMAL HIGH (ref 0.44–1.00)
GFR, Estimated: 45 mL/min — ABNORMAL LOW (ref >60)
Glucose, Bld: 124 mg/dL — ABNORMAL HIGH (ref 70–99)
Potassium: 3.1 mmol/L — ABNORMAL LOW (ref 3.5–5.1)
Sodium: 135 mmol/L (ref 135–145)

## 2021-06-25 LAB — URINALYSIS, ROUTINE W REFLEX MICROSCOPIC
Bilirubin Urine: NEGATIVE
Glucose, UA: NEGATIVE mg/dL
Ketones, ur: 5 mg/dL — AB
Nitrite: NEGATIVE
Protein, ur: NEGATIVE mg/dL
Specific Gravity, Urine: 1.009 (ref 1.005–1.030)
pH: 6 (ref 5.0–8.0)

## 2021-06-25 LAB — CBC
HCT: 27 % — ABNORMAL LOW (ref 36.0–46.0)
Hemoglobin: 8.1 g/dL — ABNORMAL LOW (ref 12.0–15.0)
MCH: 28.3 pg (ref 26.0–34.0)
MCHC: 30 g/dL (ref 30.0–36.0)
MCV: 94.4 fL (ref 80.0–100.0)
Platelets: 544 10*3/uL — ABNORMAL HIGH (ref 150–400)
RBC: 2.86 MIL/uL — ABNORMAL LOW (ref 3.87–5.11)
RDW: 23 % — ABNORMAL HIGH (ref 11.5–15.5)
WBC: 10.3 10*3/uL (ref 4.0–10.5)
nRBC: 0.7 % — ABNORMAL HIGH (ref 0.0–0.2)

## 2021-06-25 LAB — RESP PANEL BY RT-PCR (FLU A&B, COVID) ARPGX2
Influenza A by PCR: NEGATIVE
Influenza B by PCR: NEGATIVE
SARS Coronavirus 2 by RT PCR: NEGATIVE

## 2021-06-25 MED ORDER — HALOPERIDOL LACTATE 5 MG/ML IJ SOLN: 5.0000 mg | Freq: Four times a day (QID) | INTRAMUSCULAR | Status: DC | PRN

## 2021-06-25 MED ORDER — LACTATED RINGERS IV BOLUS
1000.0000 mL | Freq: Once | INTRAVENOUS | Status: AC
  Administered 2021-06-25: 1000 mL via INTRAVENOUS

## 2021-06-25 MED ORDER — POLYETHYLENE GLYCOL 3350 17 G PO PACK
17.0000 g | PACK | Freq: Every day | ORAL | Status: DC | PRN
  Administered 2021-07-02: 17 g via ORAL
  Filled 2021-06-25: qty 1

## 2021-06-25 MED ORDER — DEXTROSE-NACL 5-0.9 % IV SOLN: INTRAVENOUS | Status: DC

## 2021-06-25 MED ORDER — ACETAMINOPHEN 325 MG PO TABS: 650.0000 mg | ORAL_TABLET | Freq: Four times a day (QID) | ORAL | Status: DC | PRN

## 2021-06-25 MED ORDER — SODIUM CHLORIDE 0.9% FLUSH
3.0000 mL | Freq: Two times a day (BID) | INTRAVENOUS | Status: DC
  Administered 2021-06-25 – 2021-07-02 (×10): 3 mL via INTRAVENOUS

## 2021-06-25 MED ORDER — ENOXAPARIN SODIUM 30 MG/0.3ML IJ SOSY
30.0000 mg | PREFILLED_SYRINGE | INTRAMUSCULAR | Status: DC
  Administered 2021-06-25: 30 mg via SUBCUTANEOUS
  Filled 2021-06-25: qty 0.3

## 2021-06-25 MED ORDER — HYDROCODONE-ACETAMINOPHEN 5-325 MG PO TABS
1.0000 | ORAL_TABLET | ORAL | Status: DC | PRN
  Administered 2021-06-25: 1 via ORAL
  Filled 2021-06-25: qty 1

## 2021-06-25 MED ORDER — SODIUM CHLORIDE 0.9 % IV SOLN
1.0000 g | INTRAVENOUS | Status: DC
  Administered 2021-06-26 – 2021-06-27 (×2): 1 g via INTRAVENOUS
  Filled 2021-06-25 (×2): qty 1

## 2021-06-25 MED ORDER — CITALOPRAM HYDROBROMIDE 20 MG PO TABS
10.0000 mg | ORAL_TABLET | Freq: Every day | ORAL | Status: DC
  Administered 2021-06-25 – 2021-07-02 (×8): 10 mg via ORAL
  Filled 2021-06-25 (×8): qty 1

## 2021-06-25 MED ORDER — SODIUM CHLORIDE 0.9 % IV SOLN
1.0000 g | Freq: Once | INTRAVENOUS | Status: AC
  Administered 2021-06-25: 1 g via INTRAVENOUS
  Filled 2021-06-25: qty 10

## 2021-06-25 MED ORDER — ONDANSETRON HCL 4 MG/2ML IJ SOLN
4.0000 mg | Freq: Four times a day (QID) | INTRAMUSCULAR | Status: DC | PRN
Start: 2021-06-25 — End: 2021-07-02

## 2021-06-25 MED ORDER — ACETAMINOPHEN 650 MG RE SUPP: 650.0000 mg | Freq: Four times a day (QID) | RECTAL | Status: DC | PRN

## 2021-06-25 MED ORDER — MORPHINE SULFATE (PF) 2 MG/ML IV SOLN: 1.0000 mg | INTRAVENOUS | Status: DC | PRN

## 2021-06-25 MED ORDER — ONDANSETRON HCL 4 MG PO TABS: 4.0000 mg | ORAL_TABLET | Freq: Four times a day (QID) | ORAL | Status: DC | PRN

## 2021-06-25 MED ORDER — POTASSIUM CHLORIDE CRYS ER 20 MEQ PO TBCR: 40.0000 meq | EXTENDED_RELEASE_TABLET | Freq: Once | ORAL | Status: DC

## 2021-06-25 MED ORDER — MIRTAZAPINE 15 MG PO TABS
7.5000 mg | ORAL_TABLET | Freq: Every day | ORAL | Status: DC
  Administered 2021-06-25 – 2021-06-28 (×3): 7.5 mg via ORAL
  Filled 2021-06-25 (×3): qty 1

## 2021-06-25 MED ORDER — POTASSIUM CHLORIDE 20 MEQ PO PACK
40.0000 meq | PACK | Freq: Once | ORAL | Status: AC
  Administered 2021-06-25: 40 meq via ORAL
  Filled 2021-06-25: qty 2

## 2021-06-25 NOTE — Progress Notes (Signed)
[place] ?Clinical Social Work ? ?Clinical Social Work was referred by medical provider for assessment of psychosocial needs.  Clinical Social Worker  following-up on referral   to offer support and assess for needs.  Patient is currently in [facility] ED. CSW will continue to follow. ? ? ? ? ? ?Amaliya Whitelaw, LCSW  ?Clinical Social Worker ?[place] ?      ? ?

## 2021-06-25 NOTE — H&P (Signed)
?History and Physical  ? ? ?Laura Wilcox LKG:[id number] DOB: May 22, 1926 DOA: 06/25/2021 ? ?PCP: Glean Hess, MD  ?Patient coming from: home  ? ? ? ?Chief Complaint: generalized weakness, not eating  ? ?HPI: 88 y/o F w/ PMH of dementia, polycythemia vera, skin cancer, depression who presents w/ generalized weakness and  poor po intake x 1.5 weeks. Pt also has been lethargic and sleeping more as per pt's daughter. As of the last week prior to admission, pt has been using a wheelchair to get around and prior to this last week, pt was walking independently. Pt denies any fever, chills, sweating, chest pain, shortness of breath, nausea, vomiting, abd pain, dysuria, urinary urgency or frequency, diarrhea or constipation. Hx was obtained from pt and pt's daughter at bedside. Pt lives alone but pt's daughter is with her mother approx 12 hrs again and lives approx 15 mins away from the pt.  ? ?Review of Systems: As per HPI otherwise 14 point review of systems negative.  ? ? ?Past Medical History:  ?Diagnosis Date  ? Acute metabolic encephalopathy 66/44/0347  ? Confusion 12/30/2018  ? Polycythemia   ? SCC (squamous cell carcinoma) 04/11/2021  ? R med dorsum hand - ED&C SCCIS  ? Squamous cell carcinoma of skin 09/06/2020  ? R thumb - ED&C  ? Urinary tract infection without hematuria 01/02/2020  ? Urticaria 06/02/2017  ? ? ?Past Surgical History:  ?Procedure Laterality Date  ? ABDOMINAL HYSTERECTOMY    ? complete  ? BREAST SURGERY    ? ? ? reports that she has never smoked. She has never used smokeless tobacco. She reports that she does not drink alcohol and does not use drugs. ? ?Allergies  ?Allergen Reactions  ? Bactrim [Sulfamethoxazole-Trimethoprim]   ?  Due to kidney disease   ? Epinephrine Other (See Comments)  ? ? ?Family History  ?Problem Relation Age of Onset  ? Heart disease Father   ? Cancer Sister   ?     breast  ? Cancer Sister   ?     breast  ? Alzheimer's disease Sister   ? ? ? ?Prior to Admission  medications   ?Medication Sig Start Date End Date Taking? Authorizing Provider  ?acetaminophen (TYLENOL) 325 MG tablet Take 325-650 mg by mouth every 6 (six) hours as needed for mild pain or moderate pain. ?Patient not taking: Reported on 06/18/2021    [provider]  ?aspirin EC 81 MG tablet Take 1 tablet (81 mg total) by mouth daily. Swallow whole. ?Patient not taking: Reported on 06/18/2021 11/30/20   Earlie Server, MD  ?calcitRIOL (ROCALTROL) 0.25 MCG capsule Take 0.25 mcg by mouth every Monday, Wednesday, and Friday. ?Patient not taking: Reported on 06/18/2021    [provider]  ?citalopram (CELEXA) 10 MG tablet Take 1 tablet (10 mg total) by mouth daily. 06/20/21   Borders, Kirt Boys, NP  ?fluorouracil (EFUDEX) 5 % cream Apply to lesion on the right lower leg BID x 1 week. Take a 3 week break then repeat treatment. Do this for 3 rounds. ?Patient not taking: Reported on 06/18/2021 01/03/21   Ralene Bathe, MD  ?fluorouracil (EFUDEX) 5 % cream Apply topically 2 (two) times daily. Bid for 7 days to left cheek ?Patient not taking: Reported on 06/18/2021 04/11/21   Ralene Bathe, MD  ?mupirocin ointment (BACTROBAN) 2 % Apply 1 application topically daily. Qd to wound on hand ?Patient not taking: Reported on 06/18/2021 04/11/21   Ralene Bathe,  MD  ?ruxolitinib phosphate (JAKAFI) 10 MG tablet Take 1 tablet (10 mg total) by mouth 2 (two) times daily. 04/25/21   Earlie Server, MD  ? ? ?Physical Exam: ?Vitals:  ? 06/25/21 1147 06/25/21 1149 06/25/21 1155 06/25/21 1548  ?BP: 114/64   104/87  ?Pulse: 85   93  ?Resp: 16   16  ?Temp: 98.2 ?F (36.8 ?C)     ?TempSrc: Oral     ?SpO2: 95%   96%  ?Weight:  49 kg 49.9 kg   ?Height:  '5\' 1"'$  (1.549 m)    ? ? ?Constitutional: NAD, calm, comfortable. Frail appearing  ?Vitals:  ? 06/25/21 1147 06/25/21 1149 06/25/21 1155 06/25/21 1548  ?BP: 114/64   104/87  ?Pulse: 85   93  ?Resp: 16   16  ?Temp: 98.2 ?F (36.8 ?C)     ?TempSrc: Oral     ?SpO2: 95%   96%  ?Weight:  49 kg 49.9  kg   ?Height:  '5\' 1"'$  (1.549 m)    ? ?Eyes: PERRL, lids and conjunctivae normal ?ENMT: Mucous membranes are moist. Posterior pharynx clear of any exudate or lesions.  ?Neck: normal, supple ?Respiratory: clear to auscultation bilaterally, no wheezing, no crackles. Normal respiratory effort.  ?Cardiovascular: S1/S2+. no  rubs / gallops. No extremity edema. ?Abdomen: soft, no tenderness, non-distended. Bowel sounds positive.  ?Musculoskeletal: no clubbing / cyanosis. No joint deformity upper and lower extremities.  ?Skin: no rashes, ulcers. Hyperpigmentation of skin in multiple areas  ?Neurologic: CN 2-12 grossly intact. Sensation intact. Moves all extremities  ?Psychiatric: Judgment and insight are poor. Alert and awake. Normal mood.  ? ? ?Labs on Admission: I have personally reviewed following labs and imaging studies ? ?CBC: ?Recent Labs  ?Lab 06/20/21 ?1245 06/25/21 ?1149  ?WBC 10.3 10.3  ?NEUTROABS 6.5  --   ?HGB 8.2* 8.1*  ?HCT 26.5* 27.0*  ?MCV 95.0 94.4  ?PLT 483* 544*  ? ?Basic Metabolic Panel: ?Recent Labs  ?Lab 06/25/21 ?1149  ?NA 135  ?K 3.1*  ?CL 95*  ?CO2 28  ?GLUCOSE 124*  ?BUN 20  ?CREATININE 1.14*  ?CALCIUM 8.8*  ? ?GFR: ?Estimated Creatinine Clearance: 23.8 mL/min (A) (by C-G formula based on SCr of 1.14 mg/dL (H)). ?Liver Function Tests: ?No results for input(s): AST, ALT, ALKPHOS, BILITOT, PROT, ALBUMIN in the last 168 hours. ?No results for input(s): LIPASE, AMYLASE in the last 168 hours. ?No results for input(s): AMMONIA in the last 168 hours. ?Coagulation Profile: ?No results for input(s): INR, PROTIME in the last 168 hours. ?Cardiac Enzymes: ?No results for input(s): CKTOTAL, CKMB, CKMBINDEX, TROPONINI in the last 168 hours. ?BNP (last 3 results) ?No results for input(s): PROBNP in the last 8760 hours. ?HbA1C: ?No results for input(s): HGBA1C in the last 72 hours. ?CBG: ?No results for input(s): GLUCAP in the last 168 hours. ?Lipid Profile: ?No results for input(s): CHOL, HDL, LDLCALC, TRIG,  CHOLHDL, LDLDIRECT in the last 72 hours. ?Thyroid Function Tests: ?No results for input(s): TSH, T4TOTAL, FREET4, T3FREE, THYROIDAB in the last 72 hours. ?Anemia Panel: ?No results for input(s): VITAMINB12, FOLATE, FERRITIN, TIBC, IRON, RETICCTPCT in the last 72 hours. ?Urine analysis: ?   ?Component Value Date/Time  ? COLORURINE YELLOW (A) 06/25/2021 1531  ? APPEARANCEUR HAZY (A) 06/25/2021 1531  ? LABSPEC 1.009 06/25/2021 1531  ? PHURINE 6.0 06/25/2021 1531  ? GLUCOSEU NEGATIVE 06/25/2021 1531  ? HGBUR SMALL (A) 06/25/2021 1531  ? [place] NEGATIVE 06/25/2021 1531  ? BILIRUBINUR neg 03/30/2021 1430  ?  KETONESUR 5 (A) 06/25/2021 1531  ? PROTEINUR NEGATIVE 06/25/2021 1531  ? UROBILINOGEN 0.2 03/30/2021 1430  ? NITRITE NEGATIVE 06/25/2021 1531  ? LEUKOCYTESUR TRACE (A) 06/25/2021 1531  ? ? ?Radiological Exams on Admission: ?DG Chest 2 View ? ?Result Date: 06/25/2021 ?CLINICAL DATA:  Shortness of breath, anorexia, fatigue and weakness. EXAM: CHEST - 2 VIEW COMPARISON:  10/22/2020. FINDINGS: Trachea is midline. Heart size stable. Lungs are somewhat low in volume with mild bibasilar atelectasis. No definite pleural fluid. Moderate to large hiatal hernia. IMPRESSION: 1. Bibasilar atelectasis. 2. Moderate to large hiatal hernia. Electronically Signed   By: Lorin Picket M.D.   On: 06/25/2021 14:16   ? ?EKG: Independently reviewed.  ? ?Assessment/Plan ?Active Problems: ?  UTI (urinary tract infection) ? ?Possible UTI: UA is positive for trace leukocytosis, few bacteria. Urine cx is pending. Continue on IV ceftriaxone ? ?Generalized weakness: PT/OT consulted ? ?Hypokalemia: KCl repleated. Will check Mg tomorrow AM ? ?Likely CKD: baseline Cr/GFR is unknown, currently stage IIIa.  ? ?Likely ACD: likely secondary to CKD. No need for a transfusion currently ? ?Polycythemia vera: continue on home dose of ruxolitinib, will have to use home medication as this medication is not on formulary here. Follows w/ Dr. Tasia Catchings outpatient.  Likely etiology of thrombocytosis  ? ?Skin cancer: on fluorouracil cream BID x 1 week & then 3 weeks off.  ? ?Depression: severity unknown. Continue on home dose of citalopram ? ?Poor po intake: pt refuses to drin

## 2021-06-25 NOTE — ED Provider Notes (Signed)
----------------------------------------- ?  3:12 PM on 06/25/2021 ?----------------------------------------- ? ?Blood pressure 114/64, pulse 85, temperature 98.2 ?F (36.8 ?C), temperature source Oral, resp. rate 16, height '5\' 1"'$  (1.549 m), weight 49.9 kg, SpO2 95 %. ? ?Assuming care from Dr. Starleen Blue.  In short, Laura Wilcox is a 88 y.o. female with a chief complaint of Weakness ?Marland Kitchen  Refer to the original H&P for additional details. ? ?The current plan of care is to follow-up labs and imaging for increased generalized weakness in patient with history of dementia. ? ?----------------------------------------- ?5:00 PM on 06/25/2021 ?----------------------------------------- ?UA is concerning for UTI, we will send for culture and treat with IV Rocephin.  In further discussion with family, patient currently lives by herself and has not been eating or drinking for the past couple of days.  We will hydrate with IV fluids and replete potassium, given unsafe situation at home in the setting of weakness secondary to UTI, case discussed with hospitalist for admission. ? ?  ?Blake Divine, MD ?06/25/21 1701 ? ?

## 2021-06-25 NOTE — Progress Notes (Signed)
PHARMACIST - PHYSICIAN COMMUNICATION ? ?CONCERNING:  Enoxaparin (Lovenox) for DVT Prophylaxis  ? ? ?RECOMMENDATION: ?Patient was prescribed enoxaparin '40mg'$  q24 hours for VTE prophylaxis.  ? ?Filed Weights  ? 06/25/21 1149 06/25/21 1155  ?Weight: 49 kg (108 lb) 49.9 kg (110 lb)  ? ? ?Body mass index is 20.78 kg/m?. ? ?Estimated Creatinine Clearance: 23.8 mL/min (A) (by C-G formula based on SCr of 1.14 mg/dL (H)). ? ?Patient is candidate for enoxaparin '30mg'$  every 24 hours based on CrCl <11m/min or Weight <45kg ? ?DESCRIPTION: ?Pharmacy has adjusted enoxaparin dose per [employer]policy. ? ?Patient is now receiving enoxaparin 30 mg every 24 hours  ? ?ABenita Gutter?06/25/2021 ?5:04 PM  ?

## 2021-06-25 NOTE — ED Triage Notes (Signed)
Per pt daughter, the pt has been refusing ot eat, having increased fatigue, weakness, pt has a hx of dementia. Pt when asked does not know why she is here.,  ?

## 2021-06-25 NOTE — ED Provider Notes (Signed)
? ?[facility] ?Provider Note ? ? ? Event Date/Time  ? First MD Initiated Contact with Patient 06/25/21 1509   ?  (approximate) ? ? ?History  ? ?Weakness ? ? ?HPI ? ?Laura Wilcox is a 88 y.o. female with past medical history of dementia, polycythemia presents with fatigue and decreased appetite.  Patient is accompanied by her daughter who is her primary caretaker.  She notes that typically she prepares all of the patient's meals.  2 weeks ago she was sick herself and was unable to get to her mom's house.  When she went to patient's house, patient had apparently not eaten or drink for about 3 days. ?Was initially seen in the emergency department for altered mental status had reassuring work-up and was discharged.  Since the time of discharge patient did have 1 day where she had some diarrhea and a low-grade temp of 99.9 with an episode of emesis however that is now resolved.  Daughter notes that she is still not eating quite as much as she normally would and seems to be more fatigued however she is not confused.  Denies any fevers chills.  Has had a mild cough.  Patient's daughter called the PCPs office today to follow-up and was told to come to the emergency department for evaluation.  He has not had any falls. ? ?Past Medical History:  ?Diagnosis Date  ? Acute metabolic encephalopathy 88/41/6606  ? Confusion 12/30/2018  ? Polycythemia   ? SCC (squamous cell carcinoma) 04/11/2021  ? R med dorsum hand - ED&C SCCIS  ? Squamous cell carcinoma of skin 09/06/2020  ? R thumb - ED&C  ? Urinary tract infection without hematuria 01/02/2020  ? Urticaria 06/02/2017  ? ? ?Patient Active Problem List  ? Diagnosis Date Noted  ? Myelofibrosis ([place]) 01/31/2020  ? Other pancytopenia ([place]) 01/31/2020  ? Moderate protein-calorie malnutrition ([place])   ? Squamous cell cancer of skin of left cheek 01/16/2020  ? Chronic kidney disease, stage 3b ([place]) 03/11/2019  ? Secondary hyperparathyroidism of renal origin  ([place]) 03/11/2019  ? Anemia in chronic kidney disease 03/11/2019  ? Generalized weakness 12/17/2018  ? Elevated ferritin 02/18/2018  ? Goals of care, counseling/discussion 07/09/2017  ? Polycythemia rubra vera ([place]) 06/02/2017  ? Underweight 06/02/2017  ? Advanced care planning/counseling discussion 06/02/2017  ? Age-related cognitive decline 06/02/2017  ? ? ? ?Physical Exam  ?Triage Vital Signs: ?ED Triage Vitals  ?Enc Vitals Group  ?   BP 06/25/21 1147 114/64  ?   Pulse Rate 06/25/21 1147 85  ?   Resp 06/25/21 1147 16  ?   Temp 06/25/21 1147 98.2 ?F (36.8 ?C)  ?   Temp Source 06/25/21 1147 Oral  ?   SpO2 06/25/21 1147 95 %  ?   Weight 06/25/21 1149 108 lb (49 kg)  ?   Height 06/25/21 1149 '5\' 1"'$  (1.549 m)  ?   Head Circumference --   ?   Peak Flow --   ?   Pain Score 06/25/21 1155 0  ?   Pain Loc --   ?   Pain Edu? --   ?   Excl. in [place]? --   ? ? ?Most recent vital signs: ?Vitals:  ? 06/25/21 1147 06/25/21 1548  ?BP: 114/64 104/87  ?Pulse: 85 93  ?Resp: 16 16  ?Temp: 98.2 ?F (36.8 ?C)   ?SpO2: 95% 96%  ? ? ? ?General: Awake, no distress.  ?CV:  Good peripheral perfusion.  ?Resp:  Normal effort.  ?Abd:  No distention.  Soft and nontender throughout ?Neuro:            Patient is hard of hearing, able to answer simple yes and no questions, oriented to person, moves all extremities symmetrically ?Other:   ? ? ?ED Results / Procedures / Treatments  ?Labs ?(all labs ordered are listed, but only abnormal results are displayed) ?Labs Reviewed  ?BASIC METABOLIC PANEL - Abnormal; Notable for the following components:  ?    Result Value  ? Potassium 3.1 (*)   ? Chloride 95 (*)   ? Glucose, Bld 124 (*)   ? Creatinine, Ser 1.14 (*)   ? Calcium 8.8 (*)   ? GFR, Estimated 45 (*)   ? All other components within normal limits  ?CBC - Abnormal; Notable for the following components:  ? RBC 2.86 (*)   ? Hemoglobin 8.1 (*)   ? HCT 27.0 (*)   ? RDW 23.0 (*)   ? Platelets 544 (*)   ? nRBC 0.7 (*)   ? All other components within normal  limits  ?URINALYSIS, ROUTINE W REFLEX MICROSCOPIC - Abnormal; Notable for the following components:  ? Color, Urine YELLOW (*)   ? APPearance HAZY (*)   ? Hgb urine dipstick SMALL (*)   ? Ketones, ur 5 (*)   ? Leukocytes,Ua TRACE (*)   ? Bacteria, UA FEW (*)   ? All other components within normal limits  ?RESP PANEL BY RT-PCR (FLU A&B, COVID) ARPGX2  ?URINE CULTURE  ?CBG MONITORING, ED  ? ? ? ?EKG ? ?EKG interpretation performed by myself: NSR, nml axis, nml intervals, no acute ischemic changes ? ? ? ?RADIOLOGY ?I reviewed the CXR which does not show any acute cardiopulmonary process; agree with radiology report  ? ? ? ?PROCEDURES: ? ?Critical Care performed: No ? ?.1-3 Lead EKG Interpretation ?Performed by: Rada Hay, MD ?Authorized by: Rada Hay, MD  ? ?  Interpretation: normal   ?  ECG rate assessment: normal   ?  Ectopy: none   ?  Conduction: normal   ? ?The patient is on the cardiac monitor to evaluate for evidence of arrhythmia and/or significant heart rate changes. ? ? ?MEDICATIONS ORDERED IN ED: ?Medications  ?cefTRIAXone (ROCEPHIN) 1 g in sodium chloride 0.9 % 100 mL IVPB (has no administration in time range)  ?lactated ringers bolus 1,000 mL (has no administration in time range)  ?potassium chloride (KLOR-CON) packet 40 mEq (has no administration in time range)  ? ? ? ?IMPRESSION / MDM / ASSESSMENT AND PLAN / ED COURSE  ?I reviewed the triage vital signs and the nursing notes. ?             ?               ? ?Differential diagnosis includes, but is not limited to, UTI, pneumonia, dehydration, electrolyte abnormality, AKI, progression of dementia ? ?Patient is a 88 year old female who presents with decreased p.o. intake and fatigue.  Overall is at her baseline in terms of her mental status but is more globally weak compared to normal.  Vital signs are overall reassuring.  Patient is alert to person moves all 4 extremities spontaneously does not appear grossly encephalopathic.  Abdomen is  benign she is not in respiratory distress.  Reviewed her EKG which is nonischemic.  Labs are notable for mild hypokalemia as well as anemia to 8.1 but this is near her baseline.  Potassium was supplemented  in the ED.  Chest x-ray obtained given the cough which is negative for infiltrate does show some bibasilar atelectasis.  The time of signout patient is pending UA.  If medical indication patient's daughter would like her to be admitted and with her decline over the last 2 weeks that this is reasonable.  May need social work consultation for ultimate placement. ? ? ?FINAL CLINICAL IMPRESSION(S) / ED DIAGNOSES  ? ?Final diagnoses:  ?Weakness  ? ? ? ?Rx / DC Orders  ? ?ED Discharge Orders   ? ? None  ? ?  ? ? ? ?Note:  This document was prepared using Dragon voice recognition software and may include unintentional dictation errors. ?  ?Rada Hay, MD ?06/25/21 1654 ? ?

## 2021-06-26 DIAGNOSIS — N1832 Chronic kidney disease, stage 3b: Secondary | ICD-10-CM | POA: Diagnosis present

## 2021-06-26 DIAGNOSIS — Z85828 Personal history of other malignant neoplasm of skin: Secondary | ICD-10-CM | POA: Diagnosis not present

## 2021-06-26 DIAGNOSIS — R531 Weakness: Secondary | ICD-10-CM | POA: Diagnosis not present

## 2021-06-26 DIAGNOSIS — F0390 Unspecified dementia without behavioral disturbance: Secondary | ICD-10-CM | POA: Diagnosis present

## 2021-06-26 DIAGNOSIS — N390 Urinary tract infection, site not specified: Secondary | ICD-10-CM | POA: Diagnosis present

## 2021-06-26 DIAGNOSIS — N2581 Secondary hyperparathyroidism of renal origin: Secondary | ICD-10-CM | POA: Diagnosis present

## 2021-06-26 DIAGNOSIS — E43 Unspecified severe protein-calorie malnutrition: Secondary | ICD-10-CM | POA: Diagnosis present

## 2021-06-26 DIAGNOSIS — F32A Depression, unspecified: Secondary | ICD-10-CM | POA: Diagnosis not present

## 2021-06-26 DIAGNOSIS — F0392 Unspecified dementia, unspecified severity, with psychotic disturbance: Secondary | ICD-10-CM | POA: Diagnosis not present

## 2021-06-26 DIAGNOSIS — B961 Klebsiella pneumoniae [K. pneumoniae] as the cause of diseases classified elsewhere: Secondary | ICD-10-CM | POA: Diagnosis present

## 2021-06-26 DIAGNOSIS — D631 Anemia in chronic kidney disease: Secondary | ICD-10-CM | POA: Diagnosis present

## 2021-06-26 DIAGNOSIS — E860 Dehydration: Secondary | ICD-10-CM | POA: Diagnosis present

## 2021-06-26 DIAGNOSIS — Z90710 Acquired absence of both cervix and uterus: Secondary | ICD-10-CM | POA: Diagnosis not present

## 2021-06-26 DIAGNOSIS — Z20822 Contact with and (suspected) exposure to covid-19: Secondary | ICD-10-CM | POA: Diagnosis present

## 2021-06-26 DIAGNOSIS — Z66 Do not resuscitate: Secondary | ICD-10-CM | POA: Diagnosis present

## 2021-06-26 DIAGNOSIS — E440 Moderate protein-calorie malnutrition: Secondary | ICD-10-CM

## 2021-06-26 DIAGNOSIS — I129 Hypertensive chronic kidney disease with stage 1 through stage 4 chronic kidney disease, or unspecified chronic kidney disease: Secondary | ICD-10-CM | POA: Diagnosis present

## 2021-06-26 DIAGNOSIS — Z681 Body mass index (BMI) 19 or less, adult: Secondary | ICD-10-CM | POA: Diagnosis not present

## 2021-06-26 DIAGNOSIS — F0393 Unspecified dementia, unspecified severity, with mood disturbance: Secondary | ICD-10-CM | POA: Diagnosis present

## 2021-06-26 DIAGNOSIS — D45 Polycythemia vera: Secondary | ICD-10-CM | POA: Diagnosis present

## 2021-06-26 DIAGNOSIS — E876 Hypokalemia: Secondary | ICD-10-CM | POA: Diagnosis present

## 2021-06-26 DIAGNOSIS — Z8249 Family history of ischemic heart disease and other diseases of the circulatory system: Secondary | ICD-10-CM | POA: Diagnosis not present

## 2021-06-26 DIAGNOSIS — Z803 Family history of malignant neoplasm of breast: Secondary | ICD-10-CM | POA: Diagnosis not present

## 2021-06-26 DIAGNOSIS — D75838 Other thrombocytosis: Secondary | ICD-10-CM | POA: Diagnosis present

## 2021-06-26 LAB — BASIC METABOLIC PANEL
Anion gap: 8 (ref 5–15)
BUN: 13 mg/dL (ref 8–23)
CO2: 28 mmol/L (ref 22–32)
Calcium: 8.4 mg/dL — ABNORMAL LOW (ref 8.9–10.3)
Chloride: 102 mmol/L (ref 98–111)
Creatinine, Ser: 0.88 mg/dL (ref 0.44–1.00)
GFR, Estimated: >60 mL/min (ref >60)
Glucose, Bld: 103 mg/dL — ABNORMAL HIGH (ref 70–99)
Potassium: 4.1 mmol/L (ref 3.5–5.1)
Sodium: 138 mmol/L (ref 135–145)

## 2021-06-26 LAB — CBC
HCT: 24.2 % — ABNORMAL LOW (ref 36.0–46.0)
Hemoglobin: 7.3 g/dL — ABNORMAL LOW (ref 12.0–15.0)
MCH: 28.7 pg (ref 26.0–34.0)
MCHC: 30.2 g/dL (ref 30.0–36.0)
MCV: 95.3 fL (ref 80.0–100.0)
Platelets: 488 10*3/uL — ABNORMAL HIGH (ref 150–400)
RBC: 2.54 MIL/uL — ABNORMAL LOW (ref 3.87–5.11)
RDW: 22.5 % — ABNORMAL HIGH (ref 11.5–15.5)
WBC: 8.8 10*3/uL (ref 4.0–10.5)
nRBC: 0.6 % — ABNORMAL HIGH (ref 0.0–0.2)

## 2021-06-26 LAB — MAGNESIUM: Magnesium: 1.7 mg/dL (ref 1.7–2.4)

## 2021-06-26 LAB — BRAIN NATRIURETIC PEPTIDE: B Natriuretic Peptide: 147.3 pg/mL — ABNORMAL HIGH (ref 0.0–100.0)

## 2021-06-26 MED ORDER — ENOXAPARIN SODIUM 40 MG/0.4ML IJ SOSY
40.0000 mg | PREFILLED_SYRINGE | INTRAMUSCULAR | Status: DC
  Administered 2021-06-26: 40 mg via SUBCUTANEOUS

## 2021-06-26 MED ORDER — ENSURE ENLIVE PO LIQD
237.0000 mL | ORAL | Status: DC
  Administered 2021-06-26: 237 mL via ORAL

## 2021-06-26 MED ORDER — BOOST / RESOURCE BREEZE PO LIQD CUSTOM
1.0000 | Freq: Two times a day (BID) | ORAL | Status: DC
Start: 2021-06-26 — End: 2021-06-27

## 2021-06-26 MED ORDER — HYDRALAZINE HCL 10 MG PO TABS
10.0000 mg | ORAL_TABLET | Freq: Three times a day (TID) | ORAL | Status: DC | PRN
  Administered 2021-06-26 – 2021-06-27 (×3): 10 mg via ORAL
  Filled 2021-06-26 (×4): qty 1

## 2021-06-26 NOTE — Assessment & Plan Note (Signed)
Continue Celexa

## 2021-06-26 NOTE — Assessment & Plan Note (Addendum)
BMI of 17.  Nutrition Status: ?Nutrition Problem: Moderate Malnutrition ?Etiology: chronic illness (dementia) ?Signs/Symptoms: moderate fat depletion, severe muscle depletion ?Interventions: Boost Breeze, Magic cup, Ensure Enlive (each supplement provides 350kcal and 20 grams of protein) ? ? ? ?

## 2021-06-26 NOTE — Assessment & Plan Note (Signed)
Based on previous labs, look to be at baseline.  However, following IV fluids, kidney function much improved from even previous lab checks.  This may be in part due to patient's very poor p.o. intake.  She is not on a diuretic.  Monitor closely. ?

## 2021-06-26 NOTE — Progress Notes (Signed)
Initial Nutrition Assessment ? ?DOCUMENTATION CODES:  ? ?Non-severe (moderate) malnutrition in context of chronic illness ? ?INTERVENTION:  ?- Encourage PO intakes ?- Magic cup TID with meals, each supplement provides 290 kcal and 9 grams of protein ?- Boost Breeze po BID, each supplement provides 250 kcal and 9 grams of protein ?- Ensure Enlive po once daily, each supplement provides 350 kcal and 20 grams of protein. ? ?NUTRITION DIAGNOSIS:  ? ?Moderate Malnutrition related to chronic illness (dementia) as evidenced by moderate fat depletion, severe muscle depletion. ? ?GOAL:  ? ?Patient will meet greater than or equal to 90% of their needs ? ? ?MONITOR:  ? ?PO intake, Supplement acceptance, Labs, Weight trends ? ?REASON FOR ASSESSMENT:  ? ?Malnutrition Screening Tool ?  ? ?ASSESSMENT:  ? ?Pt admitted with generalized weakness and poor PO intake x 1.5 weeks secondary to UTI. PMH significant for dementia, polycythemia vera, skin cancer, depression. ? ?Pt sitting in chair with family member at side. Pt with dementia, most history obtained from family. He states that she eats very little which has been ongoing. Pt is able to feed self but she usually takes lots of encouraging to take PO intake. Her outpatient MD has recommended all types of nutrition supplements but pt does not like them and is refusing them. We discussed ordering supplements during admission and continuing to offer them as appropriate and they are agreeable. RD provided Ensure on lunch tray for pt to try.  ? ?Reviewed weight history. Weights appear stable at 50 kg. ? ?Medications: remeron, IV abx ? ?Labs: calcium 8.4 ? ?NUTRITION - FOCUSED PHYSICAL EXAM: ? ?Flowsheet Row Most Recent Value  ?Orbital Region Moderate depletion  ?Upper Arm Region Severe depletion  ?Thoracic and Lumbar Region Moderate depletion  ?Buccal Region Mild depletion  ?Temple Region Moderate depletion  ?Clavicle Bone Region Moderate depletion  ?Clavicle and Acromion Bone Region  Mild depletion  ?Scapular Bone Region Moderate depletion  ?Dorsal Hand Severe depletion  ?Patellar Region Severe depletion  ?Anterior Thigh Region Severe depletion  ?Posterior Calf Region Moderate depletion  ?Edema (RD Assessment) None  ?Hair Reviewed  ?Eyes Reviewed  ?Mouth Reviewed  ?Skin Reviewed  ?Nails Reviewed  ? ?  ? ? ?Diet Order:   ?Diet Order   ? ?       ?  Diet regular Room service appropriate? Yes; Fluid consistency: Thin  Diet effective now       ?  ? ?  ?  ? ?  ? ? ?EDUCATION NEEDS:  ? ?No education needs have been identified at this time ? ?Skin:  Skin Assessment: Reviewed RN Assessment ? ?Last BM:  PTA ? ?Height:  ? ?Ht Readings from Last 1 Encounters:  ?06/25/21 '5\' 7"'$  (1.702 m)  ? ? ?Weight:  ? ?Wt Readings from Last 1 Encounters:  ?06/25/21 50 kg  ? ?BMI:  Body mass index is 17.26 kg/m?. ? ?Estimated Nutritional Needs:  ? ?Kcal:  1400-1600 ? ?Protein:  70-85g ? ?Fluid:  >/=1.5L ? ?Clayborne Dana, RDN, LDN ?Clinical Nutrition ?

## 2021-06-26 NOTE — Assessment & Plan Note (Signed)
Mild drop from previous day although this was from hemoconcentration.  Transfuse for hemoglobin below 7. ?

## 2021-06-26 NOTE — Assessment & Plan Note (Signed)
Has been going on for some time.  Patient has refused evaluation in the past.  She lives at home with her daughter but daughter has been increasingly frustrated and patient may be a good candidate for memory care unit.  We will get TOC involved. ?

## 2021-06-26 NOTE — Evaluation (Signed)
Occupational Therapy Evaluation ?Patient Details ?Name: Laura Wilcox ?MRN: [id number] ?DOB: 05/08/1926 ?Today's Date: 06/26/2021 ? ? ?History of Present Illness 88 y/o F w/ PMH of dementia, polycythemia vera, skin cancer, depression who presents w/ generalized weakness and  poor po intake x 1.5 weeks.  ? ?Clinical Impression ?  ?Laura Wilcox was seen for OT evaluation this date. Prior to hospital admission, pt was Independent for mobility and ADLs however in recent ~2 weeks pt requiring assist from family and refusing hygiene tasks. Pt lives alone in apartment with daughter available PRN. Pt presents to acute OT demonstrating impaired ADL performance and functional mobility 2/2 decreased activity tolerance, poor insight into deficits, and functional strength/ROM/balance deficits.  ? ?Pt currently requires MIN A + RW for toilet t/f. MOD A for LBD in standing, SETUP + SUPERVISION don/doff B socks in sitting - pt requires MAX encouragement to agree to doff soiled underwear/pants. CGA perihygiene and hand washing standing sink side with poor safety awareness noted. Pt would benefit from skilled OT to address noted impairments and functional limitations (see below for any additional details). Upon hospital discharge, recommend STR to maximize pt safety and return to PLOF. ?   ? ?Recommendations for follow up therapy are one component of a multi-disciplinary discharge planning process, led by the attending physician.  Recommendations may be updated based on patient status, additional functional criteria and insurance authorization.  ? ?Follow Up Recommendations ? Skilled nursing-short term rehab (<3 hours/day)  ?  ?Assistance Recommended at Discharge Frequent or constant Supervision/Assistance  ?Patient can return home with the following A little help with walking and/or transfers;A lot of help with bathing/dressing/bathroom ? ?  ?Functional Status Assessment ? Patient has had a recent decline in their functional status  and demonstrates the ability to make significant improvements in function in a reasonable and predictable amount of time.  ?Equipment Recommendations ? BSC/3in1  ?  ?Recommendations for Other Services   ? ? ?  ?Precautions / Restrictions Precautions ?Precautions: Fall ?Restrictions ?Weight Bearing Restrictions: No  ? ?  ? ?Mobility Bed Mobility ?Overal bed mobility: Needs Assistance ?Bed Mobility: Supine to Sit ?  ?  ?Supine to sit: Min guard ?  ?  ?  ?  ? ?Transfers ?Overall transfer level: Needs assistance ?Equipment used: Rolling walker (2 wheels) ?Transfers: Sit to/from Stand ?Sit to Stand: Min assist ?  ?  ?  ?  ?  ?  ?  ? ?  ?Balance Overall balance assessment: Needs assistance ?Sitting-balance support: No upper extremity supported, Feet supported ?Sitting balance-Leahy Scale: Fair ?  ?  ?Standing balance support: Single extremity supported, During functional activity ?Standing balance-Leahy Scale: Fair ?  ?  ?  ?  ?  ?  ?  ?  ?  ?  ?  ?  ?   ? ?ADL either performed or assessed with clinical judgement  ? ?ADL Overall ADL's : Needs assistance/impaired ?  ?  ?  ?  ?  ?  ?  ?  ?  ?  ?  ?  ?  ?  ?  ?  ?  ?  ?  ?General ADL Comments: MIN A + RW for toilet t/f. MOD A LBD in standing - pt requires MAX  encouragement to agree to doff soiled underwear/pants. CGA perihygiene and hand washing standing sink side  ? ? ? ? ?Pertinent Vitals/Pain Pain Assessment ?Pain Assessment: No/denies pain  ? ? ? ?Hand Dominance   ?  ?Extremity/Trunk Assessment Upper Extremity  Assessment ?Upper Extremity Assessment: Generalized weakness ?  ?Lower Extremity Assessment ?Lower Extremity Assessment: Generalized weakness ?  ?  ?  ?Communication Communication ?Communication: HOH ?  ?Cognition Arousal/Alertness: Awake/alert ?Behavior During Therapy: [facility] for tasks assessed/performed, Agitated ?Overall Cognitive Status: History of cognitive impairments - at baseline ?  ?  ?  ?  ?  ?  ?  ?  ?  ?  ?  ?  ?  ?  ?  ?  ?  ?  ?  ?   ?   ?   ? ? ?Home  Living Family/patient expects to be discharged to:: Private residence ?Living Arrangements: Alone ?Available Help at Discharge: Family;Available PRN/intermittently ?Type of Home: Apartment ?Home Access: Level entry ?  ?  ?Home Layout: One level ?  ?  ?Bathroom Shower/Tub: Tub/shower unit ?  ?Bathroom Toilet: Handicapped height ?  ?  ?Home Equipment: Grab bars - tub/shower ?  ?  ?  ? ?  ?Prior Functioning/Environment Prior Level of Function : Needs assist ? Cognitive Assist : ADLs (cognitive) ?  ?ADLs (Cognitive): Intermittent cues ?Physical Assist : ADLs (physical) ?  ?ADLs (physical): Bathing;IADLs;Grooming ?  ?ADLs Comments: uses pads at baseline however increased incontinence ?  ? ?  ?  ?OT Problem List: Decreased activity tolerance;Decreased strength;Impaired balance (sitting and/or standing);Decreased safety awareness ?  ?   ?OT Treatment/Interventions: Self-care/ADL training;Therapeutic exercise;Energy conservation;DME and/or AE instruction;Patient/family education;Balance training;Cognitive remediation/compensation;Therapeutic activities  ?  ?OT Goals(Current goals can be found in the care plan section) Acute Rehab OT Goals ?Patient Stated Goal: to go home with assist ?OT Goal Formulation: With patient/family ?Time For Goal Achievement: 07/10/21 ?Potential to Achieve Goals: Good ?ADL Goals ?Pt Will Perform Grooming: with modified independence;standing ?Pt Will Perform Lower Body Dressing: with modified independence;sit to/from stand ?Pt Will Transfer to Toilet: with modified independence;ambulating;regular height toilet  ?OT Frequency: Min 2X/week ?  ? ?Co-evaluation   ?  ?  ?  ?  ? ?  ?AM-PAC OT "6 Clicks" Daily Activity     ?Outcome Measure Help from another person eating meals?: None ?Help from another person taking care of personal grooming?: A Little ?Help from another person toileting, which includes using toliet, bedpan, or urinal?: A Little ?Help from another person bathing (including washing,  rinsing, drying)?: A Lot ?Help from another person to put on and taking off regular upper body clothing?: A Little ?Help from another person to put on and taking off regular lower body clothing?: A Lot ?6 Click Score: 17 ?  ?End of Session Equipment Utilized During Treatment: Rolling walker (2 wheels) ? ?Activity Tolerance: Patient tolerated treatment well ?Patient left: in chair;with call bell/phone within reach;with family/visitor present;Other (comment) (PT in room) ? ?OT Visit Diagnosis: Muscle weakness (generalized) (M62.81)  ?              ?Time: 1517-6160 ?OT Time Calculation (min): 26 min ?Charges:  OT General Charges ?$OT Visit: 1 Visit ?OT Evaluation ?$OT Eval Moderate Complexity: 1 Mod ?OT Treatments ?$Self Care/Home Management : 8-22 mins ? ?Dessie Coma, M.S. OTR/L  ?06/26/21, 1:20 PM  ?ascom [phone] ? ?

## 2021-06-26 NOTE — Assessment & Plan Note (Addendum)
Unclear etiology.  Looks to more deconditioning and decline.  PT and OT recommending skilled nursing.  Family is interested in long-term skilled nursing/memory care unit.  Case management working on this.  Patient had a CT scan of her head done on 3/21 which was unremarkable.  Some of her immediate decline could be secondary to possible UTI and dehydration.  We will continue to follow ?

## 2021-06-26 NOTE — Progress Notes (Signed)
This RN called pt's daughter [name] to ask if anyone is available to come in and sit with pt tonight. Pt is getting out of bed repeatedly and has become verbally aggressive with this RN when trying to re-direct. Pt's daughter will call back.  ?

## 2021-06-26 NOTE — Assessment & Plan Note (Signed)
Patient actually anemic.  Monitor closely.  Also with thrombocytosis. ?

## 2021-06-26 NOTE — Progress Notes (Signed)
PHARMACIST - PHYSICIAN COMMUNICATION ? ?CONCERNING:  Enoxaparin (Lovenox) for DVT Prophylaxis  ? ? ?RECOMMENDATION: ?Patient was prescribed enoxaparin '30mg'$  q24 hours for VTE prophylaxis.  ? ?Filed Weights  ? 06/25/21 1149 06/25/21 1155 06/25/21 2043  ?Weight: 49 kg (108 lb) 49.9 kg (110 lb) 50 kg (110 lb 3.7 oz)  ? ? ?Body mass index is 17.26 kg/m?. ? ?Estimated Creatinine Clearance: 32.2 mL/min (by C-G formula based on SCr of 0.88 mg/dL). ? ?Patient is candidate for enoxaparin '40mg'$  every 24 hours based on CrCl >43m/min  ? ?DESCRIPTION: ?[facility] ? ?Patient is now receiving enoxaparin 40 mg every 24 hours  ? ?ADarrick Penna PharmD, MS PGPM ?Clinical Pharmacist ?06/26/2021 ?8:28 AM ? ?

## 2021-06-26 NOTE — Evaluation (Signed)
Physical Therapy Evaluation ?Patient Details ?Name: Laura Wilcox ?MRN: [id number] ?DOB: 1926-11-02 ?Today's Date: 06/26/2021 ? ?History of Present Illness ? 88 y/o F w/ PMH of dementia, polycythemia vera, skin cancer, depression who presents w/ generalized weakness and  poor po intake x 1.5 weeks; admitted for management of UTI.  ?Clinical Impression ? Patient standing at sink with OT upon arrival to room, completing hand-hygiene post toileting/ADLs.  Patient remains globally confused, slightly agitated/distractible (but easily redirectable) at times.  Bilat UE/LE strength and ROM grossly symmetrical and WFL; no focal weakness appreciated.  Requires min assist and use of RW for standing balance and in-room gait (15'); maintains very forward flexed posture, demonstrates inconsistent stepping performance and poor functional balance. Unsafe to attempt mobility without RW and +1 at all times at this point. ?Would benefit from skilled PT to address above deficits and promote optimal return to PLOF.; recommend transition to STR upon discharge from acute hospitalization.  May consider transition to LTC post-rehab course if family unable to manage continued care in home environment. ? ?   ? ?Recommendations for follow up therapy are one component of a multi-disciplinary discharge planning process, led by the attending physician.  Recommendations may be updated based on patient status, additional functional criteria and insurance authorization. ? ?Follow Up Recommendations Skilled nursing-short term rehab (<3 hours/day) ? ?  ?Assistance Recommended at Discharge Frequent or constant Supervision/Assistance  ?Patient can return home with the following ? A little help with walking and/or transfers;A little help with bathing/dressing/bathroom;Direct supervision/assist for medications management;Assistance with cooking/housework;Direct supervision/assist for financial management;Assist for transportation ? ?  ?Equipment  Recommendations Rolling walker (2 wheels);BSC/3in1  ?Recommendations for Other Services ?    ?  ?Functional Status Assessment Patient has had a recent decline in their functional status and demonstrates the ability to make significant improvements in function in a reasonable and predictable amount of time.  ? ?  ?Precautions / Restrictions Precautions ?Precautions: Fall ?Restrictions ?Weight Bearing Restrictions: No  ? ?  ? ?Mobility ? Bed Mobility ?  ?  ?  ?  ?  ?  ?  ?General bed mobility comments: received during hand-washing at sink in bathroom from OT; in recliner end of session ?  ? ?Transfers ?Overall transfer level: Needs assistance ?Equipment used: Rolling walker (2 wheels) ?Transfers: Sit to/from Stand ?Sit to Stand: Min assist ?  ?  ?  ?  ?  ?General transfer comment: cuing/assist for walker management, turn/obstacle negotiation ?  ? ?Ambulation/Gait ?Ambulation/Gait assistance: Min assist ?Gait Distance (Feet): 20 Feet ?Assistive device: Rolling walker (2 wheels) ?  ?  ?  ?  ?General Gait Details: hand-over-hand for walker position/management (to avoid tangling with obstacles), tends to maintain arms length anterior to patient; limited balance, limited endurance; poor safety awareness/insight ? ?Stairs ?  ?  ?  ?  ?  ? ?Wheelchair Mobility ?  ? ?Modified Rankin (Stroke Patients Only) ?  ? ?  ? ?Balance Overall balance assessment: Needs assistance ?Sitting-balance support: No upper extremity supported, Feet supported ?Sitting balance-Leahy Scale: Fair ?  ?  ?Standing balance support: Bilateral upper extremity supported ?Standing balance-Leahy Scale: Fair ?  ?  ?  ?  ?  ?  ?  ?  ?  ?  ?  ?  ?   ? ? ? ?Pertinent Vitals/Pain Pain Assessment ?Pain Assessment: No/denies pain  ? ? ?Home Living Family/patient expects to be discharged to:: Private residence ?Living Arrangements: Alone ?Available Help at Discharge: Family;Available PRN/intermittently ?Type of  Home: Apartment ?Home Access: Level entry ?  ?  ?   ?Home Layout: One level ?Home Equipment: Grab bars - tub/shower ?   ?  ?Prior Function Prior Level of Function : Needs assist ? Cognitive Assist : Mobility (cognitive) ?Mobility (Cognitive): Intermittent cues ?ADLs (Cognitive): Intermittent cues ?Physical Assist : Mobility (physical) ?  ?ADLs (physical): Bathing;IADLs;Grooming ?Mobility Comments: Supervision/mod indep for basic, household mobility at baseline; has required progressive increase in level of assist required over recent weeks (to the point of using WC for mobility at times). ?ADLs Comments: uses pads at baseline however increased incontinence ?  ? ? ?Hand Dominance  ? Dominant Hand: Right ? ?  ?Extremity/Trunk Assessment  ? Upper Extremity Assessment ?Upper Extremity Assessment: Overall WFL for tasks assessed ?  ? ?Lower Extremity Assessment ?Lower Extremity Assessment: Overall WFL for tasks assessed (grossly at least 4/5 throughout) ?  ? ?   ?Communication  ? Communication: HOH  ?Cognition Arousal/Alertness: Awake/alert ?Behavior During Therapy: [facility] for tasks assessed/performed, Agitated ?Overall Cognitive Status: History of cognitive impairments - at baseline ?  ?  ?  ?  ?  ?  ?  ?  ?  ?  ?  ?  ?  ?  ?  ?  ?General Comments: Oriented to self only; follows simple commands, but easily distracted/agitated at times (easily redirectable during session) ?  ?  ? ?  ?General Comments   ? ?  ?Exercises    ? ?Assessment/Plan  ?  ?PT Assessment Patient needs continued PT services  ?PT Problem List Decreased strength;Decreased activity tolerance;Decreased balance;Decreased mobility;Decreased coordination;Decreased cognition;Decreased knowledge of use of DME;Decreased safety awareness;Decreased knowledge of precautions ? ?   ?  ?PT Treatment Interventions DME instruction;Gait training;Functional mobility training;Therapeutic activities;Therapeutic exercise;Balance training;Patient/family education;Cognitive remediation   ? ?PT Goals (Current goals can be found in  the Care Plan section)  ?Acute Rehab PT Goals ?Patient Stated Goal: per daughter, to have increased support/resources to manage patient outside of the hospital; ?PT Goal Formulation: With patient/family ?Time For Goal Achievement: 07/10/21 ?Potential to Achieve Goals: Good ? ?  ?Frequency Min 2X/week ?  ? ? ?Co-evaluation   ?  ?  ?  ?  ? ? ?  ?AM-PAC PT "6 Clicks" Mobility  ?Outcome Measure Help needed turning from your back to your side while in a flat bed without using bedrails?: None ?Help needed moving from lying on your back to sitting on the side of a flat bed without using bedrails?: A Little ?Help needed moving to and from a bed to a chair (including a wheelchair)?: A Little ?Help needed standing up from a chair using your arms (e.g., wheelchair or bedside chair)?: A Little ?Help needed to walk in hospital room?: A Little ?Help needed climbing 3-5 steps with a railing? : A Lot ?6 Click Score: 18 ? ?  ?End of Session Equipment Utilized During Treatment: Gait belt ?Activity Tolerance: Patient tolerated treatment well ?Patient left: with call bell/phone within reach;in chair;with chair alarm set;with family/visitor present ?Nurse Communication: Mobility status ?PT Visit Diagnosis: Muscle weakness (generalized) (M62.81);Difficulty in walking, not elsewhere classified (R26.2) ?  ? ?Time: 5284-1324 ?PT Time Calculation (min) (ACUTE ONLY): 19 min ? ? ?Charges:   PT Evaluation ?$PT Eval Moderate Complexity: 1 Mod ?  ?  ?   ? ?Ambrose Wile H. Owens Shark, PT, DPT, NCS ?06/26/21, 3:22 PM ?[phone] ? ? ?

## 2021-06-26 NOTE — Progress Notes (Signed)
[facility] Progress Note ? ?Patient: Laura Wilcox    GUR:[id number]  DOA: 06/25/2021    ?Date of Service: the patient was seen and examined on 06/26/2021 ? ?Brief hospital course: ?88 year old female with past medical history of dementia, polycythemia vera, depression and skin cancer who presented to the emergency room on 3/27 with complaints of weakness and poor p.o. intake with increased somnolence (as per patient's daughter) for the past 10 days.  Normally patient walks independently but for the previous week, has been using a wheelchair to get around.  In the emergency room, patient found to have possible UTI.  Started on fluids and IV Rocephin.  Brought in for further evaluation. ? ?Assessment and Plan: ?Assessment and Plan: ?* UTI (urinary tract infection) ?Mild.  On IV Rocephin.  Urine cultures pending. ? ?Generalized weakness ?Unclear etiology.  Looks to more deconditioning and decline.  PT and OT recommending skilled nursing.  Family is interested in long-term skilled nursing/memory care unit.  Case management working on this.  Patient had a CT scan of her head done on 3/21 which was unremarkable.  Some of her immediate decline could be secondary to possible UTI and dehydration.  We will continue to follow ? ?Senile dementia with acute confusional state, without behavioral disturbance ?Has been going on for some time.  Patient has refused evaluation in the past.  She lives at home with her daughter but daughter has been increasingly frustrated and patient may be a good candidate for memory care unit.  We will get TOC involved. ? ?Anemia in chronic kidney disease ?Mild drop from previous day although this was from hemoconcentration.  Transfuse for hemoglobin below 7. ? ?Chronic kidney disease, stage 3b ([place]) ?Based on previous labs, look to be at baseline.  However, following IV fluids, kidney function much improved from even previous lab checks.  This may be in part due to patient's very poor p.o.  intake.  She is not on a diuretic.  Monitor closely. ? ?Polycythemia rubra vera ([place]) ?Patient actually anemic.  Monitor closely.  Also with thrombocytosis. ? ?Depression ?Continue Celexa. ? ?Protein-calorie malnutrition, moderate ([place]) ?BMI of 17.  Nutrition Status: ?Nutrition Problem: Moderate Malnutrition ?Etiology: chronic illness (dementia) ?Signs/Symptoms: moderate fat depletion, severe muscle depletion ?Interventions: Boost Breeze, Magic cup, Ensure Enlive (each supplement provides 350kcal and 20 grams of protein) ? ? ? ? ? ? ? ? ? ?Body mass index is 17.26 kg/m?Marland Kitchen  ?Nutrition Problem: Moderate Malnutrition ?Etiology: chronic illness (dementia) ?   ? ?Consultants: ?None ? ?Procedures: ?None ? ?Antimicrobials: ?IV Rocephin 3/27-present ? ?Code Status: DNR ? ? ?Subjective: Patient with no complaints ? ?Objective: ?Mildly elevated blood pressure ?Vitals:  ? 06/26/21 1600 06/26/21 1803  ?BP: (!) 184/82 (!) 152/64  ?Pulse: 87 82  ?Resp:    ?Temp: 98.1 ?F (36.7 ?C)   ?SpO2: 92%   ? ? ?Intake/Output Summary (Last 24 hours) at 06/26/2021 1822 ?Last data filed at 06/26/2021 1811 ?Gross per 24 hour  ?Intake 1747.88 ml  ?Output 1000 ml  ?Net 747.88 ml  ? ?Filed Weights  ? 06/25/21 1149 06/25/21 1155 06/25/21 2043  ?Weight: 49 kg 49.9 kg 50 kg  ? ?Body mass index is 17.26 kg/m?. ? ?Exam: ? ?General: Oriented x1-2, no acute distress ?HEENT: Normocephalic, atraumatic mucous membranes slightly dry ?Cardiovascular: Regular rate and rhythm, S1-S2, 2 out of 6 systolic ejection murmur ?Respiratory: Clear to auscultation bilaterally ?Abdomen: Soft, nontender, nondistended, positive bowel sounds ?Musculoskeletal: No clubbing or cyanosis, trace pitting edema ?Skin: No  skin breaks, tears or lesions ?Psychiatry: Underlying dementia, no acute psychosis ?Neurology: No focal deficits ? ?Data Reviewed: ?Today's labs note mildly elevated BNP of 147.  Hemoglobin of 7.3, down from 8.1 yesterday.  Improvement in creatinine to 1.14 to  0.88. ? ?Disposition:  ?Status is: Inpatient ?Remains inpatient appropriate because: Treatment of infection, nursing home placement ?  ? ?Anticipated discharge date: 3/29-3/30 ? ?Remaining issues to be resolved so that patient can be discharged: Nursing home placement, urine culture ? ? ?Family Communication: Daughter and son at the bedside ?DVT Prophylaxis: ?enoxaparin (LOVENOX) injection 40 mg Start: 06/26/21 2200 ?SCDs Start: 06/25/21 1702 ? ? ? ?Author: ?Annita Brod ,MD ?06/26/2021 6:22 PM ? ?To reach On-call, see care teams to locate the attending and reach out via [email or web address]. ?Between 7PM-7AM, please contact night-coverage ?If you still have difficulty reaching the attending provider, please page the [facility] (Director on Call) for [facility] on amion for assistance. ? ?

## 2021-06-26 NOTE — TOC Initial Note (Signed)
Transition of Care (TOC) - Initial/Assessment Note  ? ? ?Patient Details  ?Name: Laura Wilcox ?MRN: [id number] ?Date of Birth: [birth year] ? ?Transition of Care (TOC) CM/SW Contact:    ?Pete Pelt, RN ?Phone Number: ?06/26/2021, 4:26 PM ? ?Clinical Narrative:    Patient sleeping during initial assessment, daughter and son are looking into memory care for patient.  The patient awoke, oriented to self only, she states she doesn't want SNF. ? ?Will consult danielle from care patrol.            ? ? ?  ?  ? ? ?Patient Goals and CMS Choice ?  ?  ?  ? ?Expected Discharge Plan and Services ?  ?  ?  ?  ?  ?                ?  ?  ?  ?  ?  ?  ?  ?  ?  ?  ? ?Prior Living Arrangements/Services ?  ?  ?  ?       ?  ?  ?  ?  ? ?Activities of Daily Living ?Home Assistive Devices/Equipment: Wheelchair, Raised toilet seat with rails ?ADL Screening (condition at time of admission) ?Patient's cognitive ability adequate to safely complete daily activities?: Yes ?Is the patient deaf or have difficulty hearing?: Yes ?Does the patient have difficulty seeing, even when wearing glasses/contacts?: No ?Does the patient have difficulty concentrating, remembering, or making decisions?: Yes ?Patient able to express need for assistance with ADLs?: Yes ?Does the patient have difficulty dressing or bathing?: Yes ?Independently performs ADLs?: Yes (appropriate for developmental age) ?Does the patient have difficulty walking or climbing stairs?: Yes ?Weakness of Legs: Both ?Weakness of Arms/Hands: None ? ?Permission Sought/Granted ?  ?  ?   ?   ?   ?   ? ?Emotional Assessment ?  ?  ?  ?  ?  ?  ? ?Admission diagnosis:  Lower urinary tract infectious disease [N39.0] ?UTI (urinary tract infection) [N39.0] ?Weakness [R53.1] ?Patient Active Problem List  ? Diagnosis Date Noted  ? Senile dementia with acute confusional state, without behavioral disturbance 06/26/2021  ? Depression 06/26/2021  ? Weakness 06/26/2021  ? UTI (urinary tract infection)  06/25/2021  ? Myelofibrosis ([place]) 01/31/2020  ? Other pancytopenia ([place]) 01/31/2020  ? Moderate protein-calorie malnutrition ([place])   ? Squamous cell cancer of skin of left cheek 01/16/2020  ? Chronic kidney disease, stage 3b ([place]) 03/11/2019  ? Secondary hyperparathyroidism of renal origin ([place]) 03/11/2019  ? Anemia in chronic kidney disease 03/11/2019  ? Generalized weakness 12/17/2018  ? Elevated ferritin 02/18/2018  ? Goals of care, counseling/discussion 07/09/2017  ? Polycythemia rubra vera ([place]) 06/02/2017  ? Underweight 06/02/2017  ? Advanced care planning/counseling discussion 06/02/2017  ? Age-related cognitive decline 06/02/2017  ? ?PCP:  Glean Hess, MD ?Pharmacy:   ?[facility] [place] AT [facility] OF [address] ?[place] ?[place] [address] ?Phone: [phone] Fax: [phone] ? ?[facility] ?[address] ?[place] [address] ?Phone: [phone] Fax: [phone] ? ?[place] ?[address] ?[place] [address] ?Phone: [phone] Fax: [phone] ? ? ? ? ?Social Determinants of Health (SDOH) Interventions ?  ? ?Readmission Risk Interventions ? ?  06/26/2021  ?  4:26 PM  ?Readmission Risk Prevention Plan  ?Transportation Screening Complete  ?  PCP or Specialist Appt within 5-7 Days Complete  ?Home Care Screening Complete  ?Medication Review (RN CM) Complete  ? ? ? ?

## 2021-06-26 NOTE — Hospital Course (Addendum)
88 year old female with past medical history of dementia, polycythemia vera, depression and skin cancer who presented to the emergency room on 3/27 with complaints of weakness and poor p.o. intake with increased somnolence (as per patient's daughter) for the past 10 days.  Normally patient walks independently but for the previous week, has been using a wheelchair to get around.  In the emergency room, patient found to have possible UTI.  Started on fluids and IV Rocephin.  Brought in for further evaluation. ? ?Urine culture with Klebsiella pneumonia, patient initially received Rocephin followed by Keflex based on sensitivity result and completed the course of antibiotics. ? ?Her generalized weakness is felt to be more deconditioning and decline.  Patient was also found to have severe malnutrition and advanced dementia.  Poor p.o. intake. ?Family is interested in long term SNF/memory care unit where is she is being discharged for further management. ? ?Patient also has anemia of chronic disease.  Received 1 unit of blood transfusion as her worsening fatigue was thought to be due to symptomatic anemia. ? ?Patient need encouragement for p.o. intake, can supplement diet with Ensure or boost. ?She also need encouragement for p.o. hydration as renal function improved with IV fluid. ?Renal function and hemoglobin at baseline. ? ?She will continue current management and will follow-up with her providers. ?

## 2021-06-26 NOTE — Assessment & Plan Note (Addendum)
Urine cultures with Klebsiella pneumonia, patient initially received ceftriaxone and later transitioned to Keflex based on sensitivity results and completed the treatment while in the hospital. ?

## 2021-06-27 DIAGNOSIS — N390 Urinary tract infection, site not specified: Secondary | ICD-10-CM | POA: Diagnosis not present

## 2021-06-27 LAB — IRON AND TIBC
Iron: 35 ug/dL (ref 28–170)
Saturation Ratios: 19 % (ref 10.4–31.8)
TIBC: 189 ug/dL — ABNORMAL LOW (ref 250–450)
UIBC: 154 ug/dL

## 2021-06-27 LAB — CBC
HCT: 24.1 % — ABNORMAL LOW (ref 36.0–46.0)
Hemoglobin: 7.1 g/dL — ABNORMAL LOW (ref 12.0–15.0)
MCH: 28.1 pg (ref 26.0–34.0)
MCHC: 29.5 g/dL — ABNORMAL LOW (ref 30.0–36.0)
MCV: 95.3 fL (ref 80.0–100.0)
Platelets: 468 10*3/uL — ABNORMAL HIGH (ref 150–400)
RBC: 2.53 MIL/uL — ABNORMAL LOW (ref 3.87–5.11)
RDW: 23.3 % — ABNORMAL HIGH (ref 11.5–15.5)
WBC: 7.6 10*3/uL (ref 4.0–10.5)
nRBC: 0.9 % — ABNORMAL HIGH (ref 0.0–0.2)

## 2021-06-27 LAB — FERRITIN: Ferritin: 384 ng/mL — ABNORMAL HIGH (ref 11–307)

## 2021-06-27 LAB — BASIC METABOLIC PANEL
Anion gap: 7 (ref 5–15)
BUN: 15 mg/dL (ref 8–23)
CO2: 28 mmol/L (ref 22–32)
Calcium: 8.4 mg/dL — ABNORMAL LOW (ref 8.9–10.3)
Chloride: 102 mmol/L (ref 98–111)
Creatinine, Ser: 1.08 mg/dL — ABNORMAL HIGH (ref 0.44–1.00)
GFR, Estimated: 48 mL/min — ABNORMAL LOW (ref >60)
Glucose, Bld: 87 mg/dL (ref 70–99)
Potassium: 3.7 mmol/L (ref 3.5–5.1)
Sodium: 137 mmol/L (ref 135–145)

## 2021-06-27 LAB — TECHNOLOGIST SMEAR REVIEW: Plt Morphology: NONE SEEN

## 2021-06-27 LAB — VITAMIN B12: Vitamin B-12: 275 pg/mL (ref 180–914)

## 2021-06-27 LAB — SAVE SMEAR(SSMR), FOR PROVIDER SLIDE REVIEW

## 2021-06-27 MED ORDER — ENSURE ENLIVE PO LIQD
237.0000 mL | Freq: Two times a day (BID) | ORAL | Status: DC
  Administered 2021-06-27 – 2021-07-02 (×5): 237 mL via ORAL

## 2021-06-27 MED ORDER — ENOXAPARIN SODIUM 30 MG/0.3ML IJ SOSY
30.0000 mg | PREFILLED_SYRINGE | INTRAMUSCULAR | Status: DC
  Administered 2021-06-27 – 2021-07-01 (×4): 30 mg via SUBCUTANEOUS
  Filled 2021-06-27 (×5): qty 0.3

## 2021-06-27 NOTE — Progress Notes (Signed)
Physical Therapy Treatment ?Patient Details ?Name: Laura Wilcox ?MRN: [id number] ?DOB: Dec 13, 1926 ?Today's Date: 06/27/2021 ? ? ?History of Present Illness 88 y/o F w/ PMH of dementia, polycythemia vera, skin cancer, depression who presents w/ generalized weakness and  poor po intake x 1.5 weeks; admitted for management of UTI. ? ?  ?PT Comments  ? ? Patient received in bed, she was sleeping and spontaneously woke when I was in room. Son and daughter in room. Patient requires cues and encouragement to participate and move in room. She is agitated and swearing. Yells out when touched at times. She will benefit from higher level of care when she leaves hospital and preferably a memory care unit.  Patient will continue to benefit from skilled PT while here to improve strength and mobility.  ?    ?Recommendations for follow up therapy are one component of a multi-disciplinary discharge planning process, led by the attending physician.  Recommendations may be updated based on patient status, additional functional criteria and insurance authorization. ? ?Follow Up Recommendations ? Skilled nursing-short term rehab (<3 hours/day) ?  ?  ?Assistance Recommended at Discharge Frequent or constant Supervision/Assistance  ?Patient can return home with the following A little help with walking and/or transfers;A little help with bathing/dressing/bathroom;Direct supervision/assist for medications management;Assistance with cooking/housework;Direct supervision/assist for financial management;Assist for transportation ?  ?Equipment Recommendations ? Rolling walker (2 wheels)  ?  ?Recommendations for Other Services   ? ? ?  ?Precautions / Restrictions Precautions ?Precautions: Fall ?Restrictions ?Weight Bearing Restrictions: No  ?  ? ?Mobility ? Bed Mobility ?Overal bed mobility: Needs Assistance ?Bed Mobility: Supine to Sit, Sit to Supine ?  ?  ?Supine to sit: Supervision ?Sit to supine: Supervision ?  ?  ?  ? ?Transfers ?Overall  transfer level: Needs assistance ?Equipment used: Rolling walker (2 wheels), 1 person hand held assist ?Transfers: Sit to/from Stand ?Sit to Stand: Min assist, Mod assist ?  ?  ?  ?  ?  ?General transfer comment: Min assist from bed, mod A from toilet ?  ? ?Ambulation/Gait ?Ambulation/Gait assistance: Min assist ?Gait Distance (Feet): 20 Feet ?Assistive device: Rolling walker (2 wheels) ?Gait Pattern/deviations: Step-through pattern, Decreased step length - right, Decreased step length - left, Decreased stride length, Trunk flexed ?Gait velocity: decr ?  ?  ?General Gait Details: patient agitated and impulsive -unsafe with ambulation due to being angry. ? ? ?Stairs ?  ?  ?  ?  ?  ? ? ?Wheelchair Mobility ?  ? ?Modified Rankin (Stroke Patients Only) ?  ? ? ?  ?Balance Overall balance assessment: Needs assistance ?Sitting-balance support: Feet supported ?Sitting balance-Leahy Scale: Fair ?  ?  ?Standing balance support: Bilateral upper extremity supported, During functional activity, Reliant on assistive device for balance ?Standing balance-Leahy Scale: Fair ?  ?  ?  ?  ?  ?  ?  ?  ?  ?  ?  ?  ?  ? ?  ?Cognition Arousal/Alertness: Awake/alert ?Behavior During Therapy: Agitated ?Overall Cognitive Status: History of cognitive impairments - at baseline ?  ?  ?  ?  ?  ?  ?  ?  ?  ?  ?  ?  ?  ?  ?  ?  ?General Comments: Oriented to self only; follows simple commands, but easily distracted/agitated at times (easily redirectable during session) ?  ?  ? ?  ?Exercises   ? ?  ?General Comments   ?  ?  ? ?Pertinent Vitals/Pain Pain  Assessment ?Breathing: normal ?Negative Vocalization: occasional moan/groan, low speech, negative/disapproving quality ?Facial Expression: facial grimacing ?Body Language: rigid, fists clenched, knees up, pushing/pulling away, strikes out ?Consolability: distracted or reassured by voice/touch ?PAINAD Score: 6  ? ? ?Home Living   ?  ?  ?  ?  ?  ?  ?  ?  ?  ?   ?  ?Prior Function    ?  ?  ?   ? ?PT  Goals (current goals can now be found in the care plan section) Acute Rehab PT Goals ?Patient Stated Goal: per daughter, to have increased support/resources to manage patient outside of the hospital; ?PT Goal Formulation: With patient/family ?Time For Goal Achievement: 07/10/21 ?Potential to Achieve Goals: Good ?Progress towards PT goals: Not progressing toward goals - comment (patient generally uncooperative during session. Agitated) ? ?  ?Frequency ? ? ? Min 2X/week ? ? ? ?  ?PT Plan Current plan remains appropriate  ? ? ?Co-evaluation   ?  ?  ?  ?  ? ?  ?AM-PAC PT "6 Clicks" Mobility   ?Outcome Measure ? Help needed turning from your back to your side while in a flat bed without using bedrails?: None ?Help needed moving from lying on your back to sitting on the side of a flat bed without using bedrails?: None ?Help needed moving to and from a bed to a chair (including a wheelchair)?: A Little ?Help needed standing up from a chair using your arms (e.g., wheelchair or bedside chair)?: A Little ?Help needed to walk in hospital room?: A Little ?Help needed climbing 3-5 steps with a railing? : A Lot ?6 Click Score: 19 ? ?  ?End of Session Equipment Utilized During Treatment: Other (comment) (patient would not allow gait belt) ?Activity Tolerance: Patient tolerated treatment well ?Patient left: in bed;with call bell/phone within reach;with bed alarm set;with nursing/sitter in room ?Nurse Communication: Mobility status;Other (comment) (cognition, agitation) ?PT Visit Diagnosis: Unsteadiness on feet (R26.81);Muscle weakness (generalized) (M62.81);Difficulty in walking, not elsewhere classified (R26.2) ?  ? ? ?Time: 2951-8841 ?PT Time Calculation (min) (ACUTE ONLY): 25 min ? ?Charges:  $Gait Training: 8-22 mins ?$Therapeutic Activity: 8-22 mins          ?          ? ?Amanda Cockayne, PT, GCS ?06/27/21,11:09 AM ? ? ?

## 2021-06-27 NOTE — Progress Notes (Signed)
[facility] Progress Note ? ?Patient: Laura Wilcox    WVP:[id number]  DOA: 06/25/2021    ?Date of Service: the patient was seen and examined on 06/27/2021 ? ?Brief hospital course: ?88 year old female with past medical history of dementia, polycythemia vera, depression and skin cancer who presented to the emergency room on 3/27 with complaints of weakness and poor p.o. intake with increased somnolence (as per patient's daughter) for the past 10 days.  Normally patient walks independently but for the previous week, has been using a wheelchair to get around.  In the emergency room, patient found to have possible UTI.  Started on fluids and IV Rocephin.  Brought in for further evaluation. ? ?Assessment and Plan: ?Assessment and Plan: ?* UTI (urinary tract infection) ?Mild.  On IV Rocephin.  Culture growing klebsiella, sensitivities pending ? ?Generalized weakness ?Unclear etiology.  Looks to more deconditioning and decline.  PT and OT recommending skilled nursing.  Family is interested in long-term skilled nursing/memory care unit.  Case management working on this.  Patient had a CT scan of her head done on 3/21 which was unremarkable.  Some of her immediate decline could be secondary to possible UTI and dehydration.  We will continue to follow ? ?Senile dementia with acute confusional state, without behavioral disturbance ?Has been going on for some time.  Patient has refused evaluation in the past.  She lives at home with her daughter but daughter has been increasingly frustrated and patient may be a good candidate for memory care unit. TOC following. PT advising SNF, TOC and family to decide ? ?Anemia in chronic kidney disease ?Mild drop from previous day although this was from hemoconcentration.  Transfuse for hemoglobin below 7. Will check smear, iron, ferritin, b12 ? ?Chronic kidney disease, stage 3a ([place]) ?Based on previous labs, look to be at baseline.  However, following IV fluids, kidney function much  improved from even previous lab checks.  This may be in part due to patient's very poor p.o. intake.  She is not on a diuretic.  Monitor closely. ? ?Polycythemia rubra vera ([place]) ?Patient actually anemic.  Monitor closely.  Also with thrombocytosis. ? ?Depression ?Continue Celexa. ? ?Protein-calorie malnutrition, moderate ([place]) ?BMI of 17.  Nutrition Status: ?Nutrition Problem: Moderate Malnutrition ?Etiology: chronic illness (dementia) ?Signs/Symptoms: moderate fat depletion, severe muscle depletion ?Interventions: Boost Breeze, Magic cup, Ensure Enlive (each supplement provides 350kcal and 20 grams of protein) ? ? ? ? ? ? ? ? ? ?Body mass index is 17.26 kg/m?Marland Kitchen  ?Nutrition Problem: Moderate Malnutrition ?Etiology: chronic illness (dementia) ?   ? ?Consultants: ?None ? ?Procedures: ?None ? ?Antimicrobials: ?IV Rocephin 3/27-present ? ?Code Status: DNR ? ? ?Subjective: Patient with no complaints, just feeling tired ? ?Objective: ?Mildly elevated blood pressure ?Vitals:  ? 06/27/21 0548 06/27/21 0757  ?BP: (!) 153/72 (!) 160/58  ?Pulse: 82 72  ?Resp: 18 18  ?Temp: 98.6 ?F (37 ?C) 98.2 ?F (36.8 ?C)  ?SpO2: 97% 91%  ? ? ?Intake/Output Summary (Last 24 hours) at 06/27/2021 1416 ?Last data filed at 06/27/2021 1018 ?Gross per 24 hour  ?Intake 946.93 ml  ?Output --  ?Net 946.93 ml  ? ?Filed Weights  ? 06/25/21 1149 06/25/21 1155 06/25/21 2043  ?Weight: 49 kg 49.9 kg 50 kg  ? ?Body mass index is 17.26 kg/m?. ? ?Exam: ? ?General: Oriented x1-2, no acute distress ?HEENT: Normocephalic, atraumatic mucous membranes slightly dry ?Cardiovascular: Regular rate and rhythm, S1-S2, 2 out of 6 systolic ejection murmur ?Respiratory: Clear to auscultation bilaterally ?  Abdomen: Soft, nontender, nondistended, positive bowel sounds ?Musculoskeletal: No clubbing or cyanosis, trace pitting edema ?Skin: No skin breaks, tears or lesions ?Psychiatry: Underlying dementia, no acute psychosis ?Neurology: No focal deficits ? ?Data Reviewed: ?Today's  labs note mildly elevated BNP of 147.  Hemoglobin of 7.3, down from 8.1 yesterday.  Improvement in creatinine to 1.14 to 0.88. ? ?Disposition:  ?Status is: Inpatient ?Remains inpatient appropriate because: Treatment of infection, nursing home placement ?  ? ?Anticipated discharge date: 3/29-3/30 ? ?Remaining issues to be resolved so that patient can be discharged: Nursing home placement, urine culture ? ? ?Family Communication: no answer when daughter called on 3/29 ?DVT Prophylaxis: ?enoxaparin (LOVENOX) injection 30 mg Start: 06/27/21 2200 ?SCDs Start: 06/25/21 1702 ? ? ? ?Author: ?Desma Maxim ,MD ?06/27/2021 2:16 PM ? ?To reach On-call, see care teams to locate the attending and reach out via [email or web address]. ?Between 7PM-7AM, please contact night-coverage ?If you still have difficulty reaching the attending provider, please page the [facility] (Director on Call) for [facility] on amion for assistance. ? ?

## 2021-06-27 NOTE — Progress Notes (Addendum)
Nutrition Brief Note ? ?Assessed pt yesterday (3/28) for MST. Received consult to assess nutritional status shortly after. Checked in with RN today. Pt ate 90% of her breakfast this morning consisting of scrambled eggs, 2 slices of bacon and buttermilk biscuit. She is not drinking Boost Breeze. Will d/c this order and increase Ensure and continue Magic Cup to encourage PO intake.  ? ?Will continue to reassess as appropriate.  ? ?Medications/Labs reviewed. ? ?Clayborne Dana, RDN, LDN ?Clinical Nutrition ? ? ?

## 2021-06-27 NOTE — Progress Notes (Addendum)
PHARMACIST - PHYSICIAN COMMUNICATION ? ?CONCERNING:  Enoxaparin (Lovenox) for DVT Prophylaxis  ? ? ?RECOMMENDATION: ?Patient was prescribed enoxaparin '40mg'$  q24 hours for VTE prophylaxis.  ? ?Filed Weights  ? 06/25/21 1149 06/25/21 1155 06/25/21 2043  ?Weight: 49 kg (108 lb) 49.9 kg (110 lb) 50 kg (110 lb 3.7 oz)  ? ? ?Body mass index is 17.26 kg/m?. ? ?Estimated Creatinine Clearance: 26.2 mL/min (A) (by C-G formula based on SCr of 1.08 mg/dL (H)). ? ?Patient is candidate for enoxaparin '30mg'$  every 24 hours based on CrCl <71m/min ? ?DESCRIPTION: ?[facility] ? ?Patient is now receiving enoxaparin 30 mg every 24 hours  ? ?ADarrick Penna PharmD, MS PGPM ?Clinical Pharmacist ?06/27/2021 ?11:07 AM ? ?

## 2021-06-27 NOTE — Progress Notes (Addendum)
Occupational Therapy Treatment ?Patient Details ?Name: Laura Wilcox ?MRN: [id number] ?DOB: [birth year] ?Today's Date: 06/27/2021 ? ? ?History of present illness 88 y/o F w/ PMH of dementia, polycythemia vera, skin cancer, depression who presents w/ generalized weakness and  poor po intake x 1.5 weeks; admitted for management of UTI. ?  ?OT comments ? Ms Bujak was seen for OT treatment on this date. Upon arrival to room pt reclined in bed, agreeable to tx, family at bedside. Pt requires SUPERVISION for bed mobility, CGA for sit<>stand. SUPERVISION dynamic sitting tasks to complete SLUMS.  ? ?The SLUMS is a 30-point screening questionnaire that tests orientation, memory, attention, problem solving, and executive function. Pt scored a 7/30 indicating Dementia (<20 indicates positive dementia screen) and the need for further assessment. Pt with noted impairments in short term memory and problem solving limiting ability to participate functionally in ADLs and IADLs. Pt making good progress toward goals. Pt continues to benefit from skilled OT services to maximize return to PLOF and minimize risk of future falls, injury, caregiver burden, and readmission. Will continue to follow POC. On hospital d/c pt will require 24/7 SUPERVISION and will benefit from follow up therapies. ?  ? ?Recommendations for follow up therapy are one component of a multi-disciplinary discharge planning process, led by the attending physician.  Recommendations may be updated based on patient status, additional functional criteria and insurance authorization. ?   ?Follow Up Recommendations ? Skilled nursing-short term rehab (<3 hours/day)  ?  ?Assistance Recommended at Discharge Frequent or constant Supervision/Assistance  ?Patient can return home with the following ? A little help with walking and/or transfers;A lot of help with bathing/dressing/bathroom ?  ?Equipment Recommendations ? BSC/3in1  ?  ?Recommendations for Other Services   ? ?   ?Precautions / Restrictions Precautions ?Precautions: Fall ?Restrictions ?Weight Bearing Restrictions: No  ? ? ?  ? ?Mobility Bed Mobility ?Overal bed mobility: Needs Assistance ?Bed Mobility: Supine to Sit, Sit to Supine ?  ?  ?Supine to sit: Supervision ?Sit to supine: Supervision ?  ?  ?  ? ?Transfers ?Overall transfer level: Needs assistance ?Equipment used: None ?Transfers: Sit to/from Stand ?Sit to Stand: Min guard ?  ?  ?  ?  ?  ?  ?  ?  ?Balance Overall balance assessment: Needs assistance ?Sitting-balance support: Feet supported ?Sitting balance-Leahy Scale: Good ?  ?  ?Standing balance support: Single extremity supported, During functional activity ?Standing balance-Leahy Scale: Fair ?  ?  ?  ?  ?  ?  ?  ?  ?  ?  ?  ?  ?   ? ?ADL either performed or assessed with clinical judgement  ? ?ADL Overall ADL's : Needs assistance/impaired ?  ?  ?  ?  ?  ?  ?  ?  ?  ?  ?  ?  ?  ?  ?  ?  ?  ?  ?  ?General ADL Comments: CGA for ADL t/f. SUPERVISION dynamic sitting tasks ?  ? ? ? ?Cognition Arousal/Alertness: Awake/alert ?Behavior During Therapy: Agitated ?Overall Cognitive Status: History of cognitive impairments - at baseline ?  ?  ?  ?  ?  ?  ?  ?  ?  ?  ?  ?  ?  ?  ?  ?  ?General Comments: Oriented to self and place; follows simple commands, but easily distracted/agitated at times (easily redirectable during session) ?  ?  ?   ?   ?   ?   ? ? ?  Pertinent Vitals/ Pain       Pain Assessment ?Pain Assessment: No/denies pain ? ? ?Frequency ? Min 2X/week  ? ? ? ? ?  ?Progress Toward Goals ? ?OT Goals(current goals can now be found in the care plan section) ? Progress towards OT goals: Progressing toward goals ? ?Acute Rehab OT Goals ?Patient Stated Goal: to go home ?OT Goal Formulation: With patient/family ?Time For Goal Achievement: 07/10/21 ?Potential to Achieve Goals: Good ?ADL Goals ?Pt Will Perform Grooming: with modified independence;standing ?Pt Will Perform Lower Body Dressing: with modified independence;sit  to/from stand ?Pt Will Transfer to Toilet: with modified independence;ambulating;regular height toilet  ?Plan Discharge plan remains appropriate;Frequency remains appropriate   ? ?Co-evaluation ? ? ?   ?  ?  ?  ?  ? ?  ?AM-PAC OT "6 Clicks" Daily Activity     ?Outcome Measure ? ? Help from another person eating meals?: None ?Help from another person taking care of personal grooming?: A Little ?Help from another person toileting, which includes using toliet, bedpan, or urinal?: A Little ?Help from another person bathing (including washing, rinsing, drying)?: A Lot ?Help from another person to put on and taking off regular upper body clothing?: A Little ?Help from another person to put on and taking off regular lower body clothing?: A Lot ?6 Click Score: 17 ? ?  ?End of Session   ? ?OT Visit Diagnosis: Muscle weakness (generalized) (M62.81) ?  ?Activity Tolerance Patient tolerated treatment well ?  ?Patient Left in bed;with call bell/phone within reach;with bed alarm set;with family/visitor present ?  ?Nurse Communication   ?  ? ?   ? ?Time: 1211-1228 ?OT Time Calculation (min): 17 min ? ?Charges: OT General Charges ?$OT Visit: 1 Visit ?OT Treatments ?$Therapeutic Activity: 8-22 mins ? ?Dessie Coma, M.S. OTR/L  ?06/27/21, 1:48 PM  ?ascom [phone] ? ?

## 2021-06-28 DIAGNOSIS — N390 Urinary tract infection, site not specified: Secondary | ICD-10-CM | POA: Diagnosis not present

## 2021-06-28 LAB — URINE CULTURE: Culture: >=100000 — AB

## 2021-06-28 LAB — BASIC METABOLIC PANEL
Anion gap: 6 (ref 5–15)
BUN: 18 mg/dL (ref 8–23)
CO2: 29 mmol/L (ref 22–32)
Calcium: 8.4 mg/dL — ABNORMAL LOW (ref 8.9–10.3)
Chloride: 103 mmol/L (ref 98–111)
Creatinine, Ser: 1.13 mg/dL — ABNORMAL HIGH (ref 0.44–1.00)
GFR, Estimated: 46 mL/min — ABNORMAL LOW (ref >60)
Glucose, Bld: 86 mg/dL (ref 70–99)
Potassium: 3.8 mmol/L (ref 3.5–5.1)
Sodium: 138 mmol/L (ref 135–145)

## 2021-06-28 LAB — CBC
HCT: 24.3 % — ABNORMAL LOW (ref 36.0–46.0)
Hemoglobin: 7.3 g/dL — ABNORMAL LOW (ref 12.0–15.0)
MCH: 28.5 pg (ref 26.0–34.0)
MCHC: 30 g/dL (ref 30.0–36.0)
MCV: 94.9 fL (ref 80.0–100.0)
Platelets: 466 10*3/uL — ABNORMAL HIGH (ref 150–400)
RBC: 2.56 MIL/uL — ABNORMAL LOW (ref 3.87–5.11)
RDW: 23.5 % — ABNORMAL HIGH (ref 11.5–15.5)
WBC: 8.1 10*3/uL (ref 4.0–10.5)
nRBC: 0.9 % — ABNORMAL HIGH (ref 0.0–0.2)

## 2021-06-28 MED ORDER — AMLODIPINE BESYLATE 5 MG PO TABS
5.0000 mg | ORAL_TABLET | Freq: Every day | ORAL | Status: DC
  Administered 2021-06-28 – 2021-06-29 (×2): 5 mg via ORAL
  Filled 2021-06-28 (×2): qty 1

## 2021-06-28 MED ORDER — CEPHALEXIN 500 MG PO CAPS
500.0000 mg | ORAL_CAPSULE | Freq: Two times a day (BID) | ORAL | Status: AC
  Administered 2021-06-28 – 2021-06-29 (×4): 500 mg via ORAL
  Filled 2021-06-28 (×4): qty 1

## 2021-06-28 NOTE — TOC Progression Note (Addendum)
Transition of Care (TOC) - Progression Note  ? ? ?Patient Details  ?Name: Laura Wilcox ?MRN: [id number] ?Date of Birth: [birth year] ? ?Transition of Care (TOC) CM/SW Contact  ?Pete Pelt, RN ?Phone Number: ?06/28/2021, 8:56 AM ? ?Clinical Narrative:   Family is working with Andee Poles from [employer].  Patient was accepted at [facility], anticipated transfer will be tomorrow. 28 June 2021. Facility will need chest xray, specifying the absence of Tuberculosis prior to transfer, Hospitalist notified. ? ?Discharge Summary to be sent to [facility] when available.  TOC to follow. ? ?Addendum:  Danielle from [facility] stated that she provided incorrect information. The family has not decided on a facility yet, they are touring facilities today and Andee Poles will be in touch with TOC regarding placement. ? ?  ?  ? ?Expected Discharge Plan and Services ?  ?  ?  ?  ?  ?                ?  ?  ?  ?  ?  ?  ?  ?  ?  ?  ? ? ?Social Determinants of Health (SDOH) Interventions ?  ? ?Readmission Risk Interventions ? ?  06/26/2021  ?  4:26 PM  ?Readmission Risk Prevention Plan  ?Transportation Screening Complete  ?PCP or Specialist Appt within 5-7 Days Complete  ?Home Care Screening Complete  ?Medication Review (RN CM) Complete  ? ? ?

## 2021-06-28 NOTE — Progress Notes (Signed)
[facility]) Hospital Liaison note: ? ?This is a pending outpatient-based Palliative Care patient. Will continue to follow for disposition. ? ?Please call with any outpatient palliative questions or concerns. ? ?Thank you, ?Lorelee Market, LPN ?[facility] Hospital Liaison ?[phone] ? ?

## 2021-06-28 NOTE — Progress Notes (Addendum)
[facility] Progress Note ? ?Patient: Laura Wilcox    FFM:[id number]  DOA: 06/25/2021    ?Date of Service: the patient was seen and examined on 06/28/2021 ? ?Brief hospital course: ?88 year old female with past medical history of dementia, polycythemia vera, depression and skin cancer who presented to the emergency room on 3/27 with complaints of weakness and poor p.o. intake with increased somnolence (as per patient's daughter) for the past 10 days.  Normally patient walks independently but for the previous week, has been using a wheelchair to get around.  In the emergency room, patient found to have possible UTI.  Started on fluids and IV Rocephin.  Brought in for further evaluation. ? ?Assessment and Plan: ?Assessment and Plan: ?* UTI (urinary tract infection) ?Mild.  On IV Rocephin.  Culture growing klebsiella, sensitivities resulted. Transitioned to keflex today, plan for 5 day course of abx ? ?Generalized weakness ?Unclear etiology.  Looks to more deconditioning and decline.  PT and OT recommending skilled nursing.  Family is interested in long-term skilled nursing/memory care unit.  Case management working on this.  Patient had a CT scan of her head done on 3/21 which was unremarkable.  Some of her immediate decline could be secondary to possible UTI and dehydration.  We will continue to follow ? ?Senile dementia with acute confusional state, without behavioral disturbance ?Has been going on for some time.  Patient has refused evaluation in the past.  She lives at home with her daughter but daughter has been increasingly frustrated and patient may be a good candidate for memory care unit, currently in process of finding one ? ?Anemia in chronic kidney disease ?Mild drop from previous day although this was from hemoconcentration.  Transfuse for hemoglobin below 7. Labs suggestive of chronic disease ? ?Chronic kidney disease, stage 3a ([place]) ?Based on previous labs, look to be at baseline.  However,  following IV fluids, kidney function much improved from even previous lab checks.  This may be in part due to patient's very poor p.o. intake.  She is not on a diuretic.  Monitor closely. ? ?Polycythemia rubra vera ([place]) ?Patient actually anemic.  Monitor closely.  Also with thrombocytosis. ? ?Depression ?Continue Celexa. ? ?HTN ?Not on problem list but bps persistently elevated here, 160s ?- will start amlod 5 ? ? ?Protein-calorie malnutrition, moderate ([place]) ?BMI of 17.  Nutrition Status: ?Nutrition Problem: Moderate Malnutrition ?Etiology: chronic illness (dementia) ?Signs/Symptoms: moderate fat depletion, severe muscle depletion ?Interventions: Boost Breeze, Magic cup, Ensure Enlive (each supplement provides 350kcal and 20 grams of protein) ? ? ?Body mass index is 17.26 kg/m?Marland Kitchen  ?Nutrition Problem: Moderate Malnutrition ?Etiology: chronic illness (dementia) ?   ? ?Consultants: ?None ? ?Procedures: ?None ? ?Antimicrobials: ?IV Rocephin 3/27, now keflex ? ?Code Status: DNR ? ? ?Subjective: Patient with no complaints, tolerating diet ? ?Objective: ?Mildly elevated blood pressure ?Vitals:  ? 06/27/21 2028 06/28/21 0518  ?BP: (!) 161/65 (!) 162/73  ?Pulse: 81 79  ?Resp: 18 16  ?Temp: 98.1 ?F (36.7 ?C) 97.8 ?F (36.6 ?C)  ?SpO2: 92% 94%  ? ? ?Intake/Output Summary (Last 24 hours) at 06/28/2021 1241 ?Last data filed at 06/28/2021 1031 ?Gross per 24 hour  ?Intake 680 ml  ?Output --  ?Net 680 ml  ? ?Filed Weights  ? 06/25/21 1149 06/25/21 1155 06/25/21 2043  ?Weight: 49 kg 49.9 kg 50 kg  ? ?Body mass index is 17.26 kg/m?. ? ?Exam: ? ?General: Oriented x1-2, no acute distress ?HEENT: Normocephalic, atraumatic mucous membranes slightly dry ?  Cardiovascular: Regular rate and rhythm, S1-S2, 2 out of 6 systolic ejection murmur ?Respiratory: Clear to auscultation bilaterally ?Abdomen: Soft, nontender ?Musculoskeletal: No clubbing or cyanosis, trace pitting edema ?Skin: No skin breaks, tears or lesions ?Psychiatry: calm ?Neurology:  No focal deficits ? ?Data Reviewed: ?Today's labs note mildly elevated BNP of 147.  Hemoglobin of 7.3, down from 8.1 yesterday.  Improvement in creatinine to 1.14 to 0.88. ? ?Disposition:  ?Status is: Inpatient ?Remains inpatient appropriate because: placement ?  ? ?Anticipated discharge date: 1-3 days ? ?Remaining issues to be resolved so that patient can be discharged: Nursing home placement, urine culture ? ? ?Family Communication: son and daughter updated @ bedside 3/30 ?DVT Prophylaxis: ?enoxaparin (LOVENOX) injection 30 mg Start: 06/27/21 2200 ?SCDs Start: 06/25/21 1702 ? ? ? ?Author: ?Desma Maxim ,MD ?06/28/2021 12:41 PM ? ?To reach On-call, see care teams to locate the attending and reach out via [email or web address]. ?Between 7PM-7AM, please contact night-coverage ?If you still have difficulty reaching the attending provider, please page the [facility] (Director on Call) for [facility] on amion for assistance. ? ?

## 2021-06-29 ENCOUNTER — Telehealth: Payer: Self-pay | Admitting: Internal Medicine

## 2021-06-29 DIAGNOSIS — N390 Urinary tract infection, site not specified: Secondary | ICD-10-CM | POA: Diagnosis not present

## 2021-06-29 LAB — CBC
HCT: 24.9 % — ABNORMAL LOW (ref 36.0–46.0)
Hemoglobin: 7.3 g/dL — ABNORMAL LOW (ref 12.0–15.0)
MCH: 27.9 pg (ref 26.0–34.0)
MCHC: 29.3 g/dL — ABNORMAL LOW (ref 30.0–36.0)
MCV: 95 fL (ref 80.0–100.0)
Platelets: 497 10*3/uL — ABNORMAL HIGH (ref 150–400)
RBC: 2.62 MIL/uL — ABNORMAL LOW (ref 3.87–5.11)
RDW: 23.8 % — ABNORMAL HIGH (ref 11.5–15.5)
WBC: 8.5 10*3/uL (ref 4.0–10.5)
nRBC: 1.1 % — ABNORMAL HIGH (ref 0.0–0.2)

## 2021-06-29 LAB — BASIC METABOLIC PANEL
Anion gap: 7 (ref 5–15)
BUN: 23 mg/dL (ref 8–23)
CO2: 30 mmol/L (ref 22–32)
Calcium: 8.5 mg/dL — ABNORMAL LOW (ref 8.9–10.3)
Chloride: 104 mmol/L (ref 98–111)
Creatinine, Ser: 1 mg/dL (ref 0.44–1.00)
GFR, Estimated: 53 mL/min — ABNORMAL LOW (ref >60)
Glucose, Bld: 86 mg/dL (ref 70–99)
Potassium: 3.9 mmol/L (ref 3.5–5.1)
Sodium: 141 mmol/L (ref 135–145)

## 2021-06-29 LAB — PREPARE RBC (CROSSMATCH)

## 2021-06-29 MED ORDER — SODIUM CHLORIDE 0.9% IV SOLUTION: Freq: Once | INTRAVENOUS | Status: AC

## 2021-06-29 NOTE — TOC Progression Note (Addendum)
Transition of Care (TOC) - Progression Note  ? ? ?Patient Details  ?Name: Laura Wilcox ?MRN: [id number] ?Date of Birth: Dec 21, 1926 ? ?Transition of Care (TOC) CM/SW Contact  ?Krue Peterka E Rashied Corallo, LCSW ?Phone Number: ?06/29/2021, 8:45 AM ? ?Clinical Narrative:   Reached out to Danielle at [facility] and asked for an update.  ? ?10:20- Call from [facility]. She stated they toured [place] yesterday, and are [place] today and likely one more tour today. ?She has advised family to be ready to make a deposit today. She is aware patient is medically ready for DC and stated their goal is for family to make a decision and to have patient to DC to [facility] Monday.  ? ?  ?  ? ?Expected Discharge Plan and Services ?  ?  ?  ?  ?  ?                ?  ?  ?  ?  ?  ?  ?  ?  ?  ?  ? ? ?Social Determinants of Health (SDOH) Interventions ?  ? ?Readmission Risk Interventions ? ?  06/26/2021  ?  4:26 PM  ?Readmission Risk Prevention Plan  ?Transportation Screening Complete  ?PCP or Specialist Appt within 5-7 Days Complete  ?Home Care Screening Complete  ?Medication Review (RN CM) Complete  ? ? ?

## 2021-06-29 NOTE — Telephone Encounter (Signed)
Faxed last 3 notes and med list. ?

## 2021-06-29 NOTE — Progress Notes (Signed)
OT Cancellation Note ? ?Patient Details ?Name: Laura Wilcox ?MRN: [id number] ?DOB: [birth year] ? ? ?Cancelled Treatment:    Reason Eval/Treat Not Completed: Fatigue/lethargy limiting ability to participate;Patient declined, no reason specified. Chart reviewed. Attempted x2 this AM and pt refuses. Will continue to follow POC as able.  ? ?Dessie Coma, M.S. OTR/L  ?06/29/21, 1:41 PM  ?ascom [phone] ? ?

## 2021-06-29 NOTE — Progress Notes (Addendum)
[facility] Progress Note ? ?Patient: Laura Wilcox    BZJ:[id number]  DOA: 06/25/2021    ?Date of Service: the patient was seen and examined on 06/29/2021 ? ?Brief hospital course: ?88 year old female with past medical history of dementia, polycythemia vera, depression and skin cancer who presented to the emergency room on 3/27 with complaints of weakness and poor p.o. intake with increased somnolence (as per patient's daughter) for the past 10 days.  Normally patient walks independently but for the previous week, has been using a wheelchair to get around.  In the emergency room, patient found to have possible UTI.  Started on fluids and IV Rocephin.  Brought in for further evaluation. ? ?Assessment and Plan: ?Assessment and Plan: ?* UTI (urinary tract infection) ?Mild.  On IV Rocephin.  Culture growing klebsiella, sensitivities resulted. Transitioned to keflex, plan for 5 day course of abx through end of day today. ? ?Generalized weakness/fatigue ?Unclear etiology.  Looks to more deconditioning and decline.  PT and OT recommending skilled nursing.  Family is interested in long-term skilled nursing/memory care unit.  Case management working on this.  Patient had a CT scan of her head done on 3/21 which was unremarkable.  Some of her immediate decline could be secondary to possible UTI and dehydration.  We will continue to follow ? ?Senile dementia with acute confusional state, without behavioral disturbance ?Has been going on for some time.  Patient has refused evaluation in the past.  She lives at home with her daughter but daughter has been increasingly frustrated and patient may be a good candidate for memory care unit, currently in process of finding one ? ?Anemia in chronic kidney disease ?Stable in 7s. Labs suggestive of chronic disease. Daughter's concerned for patient's significant fatigue. Thus think this may be symptomatic anemia. Will transfuse 1 unit tonight. ? ?Chronic kidney disease, stage 3a  ([place]) ?Based on previous labs, look to be at baseline.  However, following IV fluids, kidney function much improved from even previous lab checks.  This may be in part due to patient's very poor p.o. intake.  She is not on a diuretic.  Monitor closely. ? ?Polycythemia rubra vera ([place]) ?Patient actually anemic.  Monitor closely.  Also with thrombocytosis. ? ?Depression ?Continue Celexa. ? ?HTN ?Not on problem list but bps persistently elevated here, improved w/ initiation of amlodipine ?- cont amlodipine ? ? ?Protein-calorie malnutrition, moderate ([place]) ?BMI of 17.  Nutrition Status: ?Nutrition Problem: Moderate Malnutrition ?Etiology: chronic illness (dementia) ?Signs/Symptoms: moderate fat depletion, severe muscle depletion ?Interventions: Boost Breeze, Magic cup, Ensure Enlive (each supplement provides 350kcal and 20 grams of protein) ? ? ?Body mass index is 17.26 kg/m?Marland Kitchen  ?Nutrition Problem: Moderate Malnutrition ?Etiology: chronic illness (dementia) ?   ? ?Consultants: ?None ? ?Procedures: ?None ? ?Antimicrobials: ?IV Rocephin 3/27, now keflex ? ?Code Status: DNR ? ? ?Subjective: Patient with no complaints, tolerating diet ? ?Objective: ?Mildly elevated blood pressure ?Vitals:  ? 06/29/21 0544 06/29/21 0801  ?BP: 123/65 (!) 144/70  ?Pulse: 70 71  ?Resp: 16 19  ?Temp: (!) 97.4 ?F (36.3 ?C) 98.2 ?F (36.8 ?C)  ?SpO2: 93% 94%  ? ? ?Intake/Output Summary (Last 24 hours) at 06/29/2021 1242 ?Last data filed at 06/28/2021 2000 ?Gross per 24 hour  ?Intake 60 ml  ?Output --  ?Net 60 ml  ? ?Filed Weights  ? 06/25/21 1149 06/25/21 1155 06/25/21 2043  ?Weight: 49 kg 49.9 kg 50 kg  ? ?Body mass index is 17.26 kg/m?. ? ?Exam: ? ?General: Oriented  x1-2, no acute distress ?HEENT: Normocephalic, atraumatic mucous membranes slightly dry ?Cardiovascular: Regular rate and rhythm, S1-S2, 2 out of 6 systolic ejection murmur ?Respiratory: Clear to auscultation bilaterally ?Abdomen: Soft, nontender ?Musculoskeletal: No clubbing or  cyanosis, trace pitting edema ?Skin: No skin breaks, tears or lesions ?Psychiatry: calm ?Neurology: No focal deficits ? ?Data Reviewed: ?Today's labs note mildly elevated BNP of 147.  Hemoglobin of 7.3, down from 8.1 yesterday.  Improvement in creatinine to 1.14 to 0.88. ? ?Disposition:  ?Status is: Inpatient ?Remains inpatient appropriate because: placement ?  ? ?Anticipated discharge date: 1-3 days ? ?Remaining issues to be resolved so that patient can be discharged: Nursing home placement ? ? ?Family Communication: daughter updated telephonically 3/31 ?DVT Prophylaxis: ?enoxaparin (LOVENOX) injection 30 mg Start: 06/27/21 2200 ?SCDs Start: 06/25/21 1702 ? ? ? ?Author: ?Desma Maxim ,MD ?06/29/2021 12:42 PM ? ?To reach On-call, see care teams to locate the attending and reach out via [email or web address]. ?Between 7PM-7AM, please contact night-coverage ?If you still have difficulty reaching the attending provider, please page the [facility] (Director on Call) for [facility] on amion for assistance. ? ?

## 2021-06-29 NOTE — Telephone Encounter (Signed)
[name] with [facility] called saying they are taking the patient in on Monday and are needing the last three visit notes from the physician , list of medications. ? ?Faxed to [phone] ? ?Phone # [phone]  ?

## 2021-06-30 DIAGNOSIS — N390 Urinary tract infection, site not specified: Secondary | ICD-10-CM | POA: Diagnosis not present

## 2021-06-30 DIAGNOSIS — N1832 Chronic kidney disease, stage 3b: Secondary | ICD-10-CM | POA: Diagnosis not present

## 2021-06-30 DIAGNOSIS — F32A Depression, unspecified: Secondary | ICD-10-CM

## 2021-06-30 DIAGNOSIS — R531 Weakness: Secondary | ICD-10-CM | POA: Diagnosis not present

## 2021-06-30 DIAGNOSIS — D45 Polycythemia vera: Secondary | ICD-10-CM

## 2021-06-30 LAB — HEMOGLOBIN AND HEMATOCRIT, BLOOD
HCT: 29.4 % — ABNORMAL LOW (ref 36.0–46.0)
Hemoglobin: 9.2 g/dL — ABNORMAL LOW (ref 12.0–15.0)

## 2021-06-30 MED ORDER — SODIUM CHLORIDE 0.9 % IV BOLUS
250.0000 mL | Freq: Once | INTRAVENOUS | Status: AC
  Administered 2021-06-30: 250 mL via INTRAVENOUS

## 2021-06-30 NOTE — Plan of Care (Signed)
  Problem: Clinical Measurements: Goal: Diagnostic test results will improve Outcome: Progressing   Problem: Coping: Goal: Level of anxiety will decrease Outcome: Progressing   

## 2021-06-30 NOTE — Progress Notes (Signed)
?PROGRESS NOTE ? ? ? ?Laura Wilcox  WYO:[id number] DOB: 30-Nov-1926 DOA: 06/25/2021 ?PCP: Glean Hess, MD  ? ?Brief Narrative:  ?88 year old female with past medical history of dementia, polycythemia vera, depression and skin cancer who presented to the emergency room on 3/27 with complaints of weakness and poor p.o. intake with increased somnolence (as per patient's daughter) for the past 10 days.  Normally patient walks independently but for the previous week, has been using a wheelchair to get around.  In the emergency room, patient found to have possible UTI.  Started on fluids and IV Rocephin.  Brought in for further evaluation. ? ? ?Assessment & Plan: ?  ?Principal Problem: ?  UTI (urinary tract infection) ?Active Problems: ?  Generalized weakness ?  Senile dementia with acute confusional state, without behavioral disturbance ([place]) ?  Chronic kidney disease, stage 3b ([place]) ?  Anemia in chronic kidney disease ?  Polycythemia rubra vera ([place]) ?  Depression ?  Protein-calorie malnutrition, moderate ([place]) ?  Weakness ? ? ?UTI (urinary tract infection) ?Mild.  On IV Rocephin.  Culture growing klebsiella, sensitivities resulted. Transitioned to keflex, COMPLETED TX ?  ?Generalized weakness/fatigue ?Unclear etiology.  Looks to more deconditioning and decline.  PT and OT recommending skilled nursing.  Family is interested in long-term skilled nursing/memory care unit.  Case management working on this.  Patient had a CT scan of her head done on 3/21 which was unremarkable.   ?  ?Senile dementia with acute confusional state, without behavioral disturbance ?Has been going on for some time.  Patient has refused evaluation in the past.  She lives at home with her daughter but daughter has been increasingly frustrated and patient may be a good candidate for memory care unit, currently in process of finding one ?  ?Anemia in chronic kidney disease ?9.2 on 4/1   Labs suggestive of chronic disease. Daughter's concerned  for patient's significant fatigue. Thus think this may be symptomatic anemia. Was  transfused 1 unit  ?  ?Chronic kidney disease, stage 3a ([place]) ?Based on previous labs, look to be at baseline.  However, following IV fluids, kidney function much improved from even previous lab checks.  This may be in part due to patient's very poor p.o. intake.  She is not on a diuretic.  Monitor closely. ?  ?Polycythemia rubra vera ([place]) ?Patient actually anemic.  Monitor closely.  Also with thrombocytosis. ?  ?Depression ?Continue Celexa. ?  ?HTN ?Was noted to not be on problem list, amlodipine started  ?BP soft today in pt with decreased po intake ?D/C'd amlodipine ?Fluid bolus 254m ?Monitor-does not appear septic or sick ?  ?  ?Protein-calorie malnutrition, moderate ([place] ?BMI of 17.  Nutrition Status: ?Nutrition Problem: Moderate Malnutrition ?Etiology: chronic illness (dementia) ?Signs/Symptoms: moderate fat depletion, severe muscle depletion ?Interventions: Boost Breeze, Magic cup, Ensure Enlive (each supplement provides 350kcal and 20 grams of protein) ?  ?  ?Body mass index is 17.26 kg/m?.Marland Kitchen ?Nutrition Problem: Moderate Malnutrition ?Etiology: chronic illness (dementia) ? ?DVT prophylaxis: Lovenox SQ  ?Code Status: DNR ? ?  ?Code Status Orders  ?(From admission, onward)  ?  ? ? ?  ? ?  Start     Ordered  ? 06/25/21 1730  Do not attempt resuscitation (DNR)  Continuous       ?Question Answer Comment  ?In the event of cardiac or respiratory ARREST Do not call a ?code blue?   ?In the event of cardiac or respiratory ARREST Do not perform Intubation,  CPR, defibrillation or ACLS   ?In the event of cardiac or respiratory ARREST Use medication by any route, position, wound care, and other measures to relive pain and suffering. May use oxygen, suction and manual treatment of airway obstruction as needed for comfort.   ?  ? 06/25/21 1729  ? ?  ?  ? ?  ? ?Code Status History   ? ? Date Active Date Inactive Code Status Order ID Comments  User Context  ? 06/25/2021 1703 06/25/2021 1729 Full Code [id number]  Wyvonnia Dusky, MD ED  ? 10/22/2020 2025 10/26/2020 2116 Full Code [id number]  Leslee Home, DO ED  ? 12/17/2018 2052 12/18/2018 1757 Full Code [id number]  Mancil Basta, MD Inpatient  ? ?  ? ?Family Communication: daughter at bedside  ?Disposition Plan:   no safe d/c plan identified ?Consults called: None ?Admission status: Inpatient ? ? ?Consultants:  ?None ? ?Procedures:  ?DG Chest 2 View ? ?Result Date: 06/25/2021 ?CLINICAL DATA:  Shortness of breath, anorexia, fatigue and weakness. EXAM: CHEST - 2 VIEW COMPARISON:  10/22/2020. FINDINGS: Trachea is midline. Heart size stable. Lungs are somewhat low in volume with mild bibasilar atelectasis. No definite pleural fluid. Moderate to large hiatal hernia. IMPRESSION: 1. Bibasilar atelectasis. 2. Moderate to large hiatal hernia. Electronically Signed   By: Lorin Picket M.D.   On: 06/25/2021 14:16  ? ?CT Head Wo Contrast ? ?Result Date: 06/15/2021 ?CLINICAL DATA:  Mental status change.  Unknown cause. EXAM: CT HEAD WITHOUT CONTRAST TECHNIQUE: Contiguous axial images were obtained from the base of the skull through the vertex without intravenous contrast. RADIATION DOSE REDUCTION: This exam was performed according to the departmental dose-optimization program which includes automated exposure control, adjustment of the mA and/or kV according to patient size and/or use of iterative reconstruction technique. COMPARISON:  CT brain 12/17/2018 FINDINGS: Brain: There is mild cortical atrophy, within normal limits for patient age. The ventricles are normal in configuration. The basilar cisterns are patent. No mass, mass effect, or midline shift. No acute intracranial hemorrhage is seen. No abnormal extra-axial fluid collection. Mild-to-moderate periventricular white matter hypodensities, similar to prior and likely chronic ischemic white matter changes. Preservation of the normal cortical gray-white  interface without CT evidence of an acute major vascular territorial cortical based infarction. Vascular: Atherosclerotic calcifications are seen within the major arteries at the skull base. No hyperdense vessel or unexpected calcification. Skull: Normal. Negative for fracture or focal lesion. Sinuses/Orbits: The visualized orbits are unremarkable. The visualized paranasal sinuses and mastoid air cells are clear. Other: None. IMPRESSION:: IMPRESSION: 1. No significant change from prior.  No acute intracranial process. 2. Mild cortical atrophy and mild to moderate chronic ischemic white matter changes. Electronically Signed   By: Yvonne Kendall M.D.   On: 06/15/2021 17:45   ? ?Antimicrobials:  ?Completed keflex  ? ? ?Subjective: ?No acute change, resting in bed comfortably ? ?Objective: ?Vitals:  ? 06/30/21 0144 06/30/21 0450 06/30/21 0742 06/30/21 0759  ?BP: 104/80 122/63 (!) 81/57 (!) 82/60  ?Pulse: 78 80 75   ?Resp: '18 18 16   '$ ?Temp: 98 ?F (36.7 ?C) 98 ?F (36.7 ?C) 98.2 ?F (36.8 ?C)   ?TempSrc: Oral Oral    ?SpO2: 92% 93% 91%   ?Weight:      ?Height:      ? ? ?Intake/Output Summary (Last 24 hours) at 06/30/2021 1242 ?Last data filed at 06/30/2021 0450 ?Gross per 24 hour  ?Intake 970.75 ml  ?Output --  ?Net 970.75 ml  ? ?  Filed Weights  ? 06/25/21 1149 06/25/21 1155 06/25/21 2043  ?Weight: 49 kg 49.9 kg 50 kg  ? ? ?Examination: ? ?General: Oriented x1-2, no acute distress ?HEENT: Normocephalic, atraumatic mucous membranes slightly dry ?Cardiovascular: Regular rate and rhythm, S1-S2, 2 out of 6 systolic ejection murmur ?Respiratory: Clear to auscultation bilaterally ?Abdomen: Soft, nontender ?Musculoskeletal: No clubbing or cyanosis, trace pitting edema ?Skin: No skin breaks, tears or lesions ?Psychiatry: calm ?Neurology: No focal deficits.  ? ? ? ?Data Reviewed: I have personally reviewed following labs and imaging studies ? ?CBC: ?Recent Labs  ?Lab 06/25/21 ?1149 06/26/21 ?4982 06/27/21 ?6415 06/28/21 ?0715  06/29/21 ?8309 06/30/21 ?4076  ?WBC 10.3 8.8 7.6 8.1 8.5  --   ?HGB 8.1* 7.3* 7.1* 7.3* 7.3* 9.2*  ?HCT 27.0* 24.2* 24.1* 24.3* 24.9* 29.4*  ?MCV 94.4 95.3 95.3 94.9 95.0  --   ?PLT 544* 488* 468* 466* 497*  --

## 2021-07-01 DIAGNOSIS — F32A Depression, unspecified: Secondary | ICD-10-CM | POA: Diagnosis not present

## 2021-07-01 DIAGNOSIS — N1832 Chronic kidney disease, stage 3b: Secondary | ICD-10-CM | POA: Diagnosis not present

## 2021-07-01 DIAGNOSIS — R531 Weakness: Secondary | ICD-10-CM | POA: Diagnosis not present

## 2021-07-01 DIAGNOSIS — N390 Urinary tract infection, site not specified: Secondary | ICD-10-CM | POA: Diagnosis not present

## 2021-07-01 LAB — BASIC METABOLIC PANEL
Anion gap: 8 (ref 5–15)
BUN: 26 mg/dL — ABNORMAL HIGH (ref 8–23)
CO2: 29 mmol/L (ref 22–32)
Calcium: 9.1 mg/dL (ref 8.9–10.3)
Chloride: 103 mmol/L (ref 98–111)
Creatinine, Ser: 1.01 mg/dL — ABNORMAL HIGH (ref 0.44–1.00)
GFR, Estimated: 52 mL/min — ABNORMAL LOW (ref >60)
Glucose, Bld: 87 mg/dL (ref 70–99)
Potassium: 4.1 mmol/L (ref 3.5–5.1)
Sodium: 140 mmol/L (ref 135–145)

## 2021-07-01 LAB — CBC WITH DIFFERENTIAL/PLATELET
Abs Immature Granulocytes: 1.11 10*3/uL — ABNORMAL HIGH (ref 0.00–0.07)
Basophils Absolute: 0.2 10*3/uL — ABNORMAL HIGH (ref 0.0–0.1)
Basophils Relative: 2 %
Eosinophils Absolute: 0.4 10*3/uL (ref 0.0–0.5)
Eosinophils Relative: 4 %
HCT: 33 % — ABNORMAL LOW (ref 36.0–46.0)
Hemoglobin: 10.2 g/dL — ABNORMAL LOW (ref 12.0–15.0)
Immature Granulocytes: 12 %
Lymphocytes Relative: 18 %
Lymphs Abs: 1.7 10*3/uL (ref 0.7–4.0)
MCH: 28.7 pg (ref 26.0–34.0)
MCHC: 30.9 g/dL (ref 30.0–36.0)
MCV: 93 fL (ref 80.0–100.0)
Monocytes Absolute: 0.8 10*3/uL (ref 0.1–1.0)
Monocytes Relative: 9 %
Neutro Abs: 5.2 10*3/uL (ref 1.7–7.7)
Neutrophils Relative %: 55 %
Platelets: 460 10*3/uL — ABNORMAL HIGH (ref 150–400)
RBC: 3.55 MIL/uL — ABNORMAL LOW (ref 3.87–5.11)
RDW: 22.9 % — ABNORMAL HIGH (ref 11.5–15.5)
WBC: 9.4 10*3/uL (ref 4.0–10.5)
nRBC: 1.2 % — ABNORMAL HIGH (ref 0.0–0.2)

## 2021-07-01 LAB — BPAM RBC
Blood Product Expiration Date: 202304112359
ISSUE DATE / TIME: 202304010114
Unit Type and Rh: 5100

## 2021-07-01 LAB — TYPE AND SCREEN
ABO/RH(D): O POS
Antibody Screen: NEGATIVE
Unit division: 0

## 2021-07-01 MED ORDER — SODIUM CHLORIDE 0.9 % IV SOLN: INTRAVENOUS | Status: AC

## 2021-07-01 NOTE — TOC Progression Note (Signed)
Transition of Care (TOC) - Progression Note  ? ? ?Patient Details  ?Name: Dyanara Cozza ?MRN: [id number] ?Date of Birth: 12/09/1926 ? ?Transition of Care (TOC) CM/SW Contact  ?Nayleen Janosik E Zoey Bidwell, LCSW ?Phone Number: ?07/01/2021, 1:22 PM ? ?Clinical Narrative:   Notified by RN that family has chosen a facility. ?Called daughter [name] who stated they have decided they want patient to go to [facility] [name] stated they are completing the paperwork for [facility] today and [facility] is coming to assess patient tomorrow. She stated [facility] should have a bed Monday or Tuesday if everything goes through. She stated they requested a TB test and FL2. CSW will complete FL2. Asked MD and RN for TB test.  ? ? ? ?  ?  ? ?Expected Discharge Plan and Services ?  ?  ?  ?  ?  ?                ?  ?  ?  ?  ?  ?  ?  ?  ?  ?  ? ? ?Social Determinants of Health (SDOH) Interventions ?  ? ?Readmission Risk Interventions ? ?  06/26/2021  ?  4:26 PM  ?Readmission Risk Prevention Plan  ?Transportation Screening Complete  ?PCP or Specialist Appt within 5-7 Days Complete  ?Home Care Screening Complete  ?Medication Review (RN CM) Complete  ? ? ?

## 2021-07-01 NOTE — Progress Notes (Signed)
?PROGRESS NOTE ? ? ? ?Laura Wilcox  OZH:[id number] DOB: July 24, 1926 DOA: 06/25/2021 ?PCP: Glean Hess, MD  ? ?Brief Narrative:  ?88 year old female with past medical history of dementia, polycythemia vera, depression and skin cancer who presented to the emergency room on 3/27 with complaints of weakness and poor p.o. intake with increased somnolence (as per patient's daughter) for the past 10 days.  Normally patient walks independently but for the previous week, has been using a wheelchair to get around.  In the emergency room, patient found to have possible UTI.  Started on fluids and IV Rocephin.  Brought in for further evaluation ? ?Further discussions with daughter reveal that they are unable to me patients increased needs in setting of dementia.  They are pursuing placement. ? ? ?Assessment & Plan: ?  ?Principal Problem: ?  UTI (urinary tract infection) ?Active Problems: ?  Generalized weakness ?  Senile dementia with acute confusional state, without behavioral disturbance ([place]) ?  Chronic kidney disease, stage 3b ([place]) ?  Anemia in chronic kidney disease ?  Polycythemia rubra vera ([place]) ?  Depression ?  Protein-calorie malnutrition, moderate ([place]) ?  Weakness ? ? ?UTI (urinary tract infection) ?Mild.  On IV Rocephin.  Culture growing klebsiella, sensitivities resulted. Transitioned to keflex, COMPLETED TX ?  ?Generalized weakness/fatigue ?Unclear etiology.  Looks to more deconditioning and decline.  PT and OT recommending skilled nursing.  Family is interested in long-term skilled nursing/memory care unit.  Case management working on this.  Patient had a CT scan of her head done on 3/21 which was unremarkable.   ?  ?Senile dementia with acute confusional state, without behavioral disturbance ?Has been going on for some time.  Patient has refused evaluation in the past.  She lives at home with her daughter but daughter has been increasingly frustrated and patient may be a good candidate for memory care  unit, currently in process of finding one ?  ?Anemia in chronic kidney disease ?9.2 on 4/1   Labs suggestive of chronic disease. Daughter's concerned for patient's significant fatigue. Thus think this may be symptomatic anemia. Was  transfused 1 unit  ?  ?Chronic kidney disease, stage 3a ([place]) ?Based on previous labs, look to be at baseline.  However, following IV fluids, kidney function much improved from even previous lab checks.  This may be in part due to patient's very poor p.o. intake.  She is not on a diuretic.  Added NS75/hr 4/2 for 1L due to decreased po intake and clinical dehydration. Monitor closely. ?  ?Polycythemia rubra vera ([place]) ?Patient actually anemic.  Monitor closely.  Also with thrombocytosis. ?  ?Depression ?Continue Celexa. ?  ?HTN ?Was noted to not be on problem list, amlodipine started  ?BP soft today in pt with decreased po intake ?D/C'd amlodipine ?Fluid bolus 220m ?Monitor-does not appear septic or sick ?  ?  ?Protein-calorie malnutrition, moderate ([place] ?BMI of 17.  Nutrition Status: ?Nutrition Problem: Moderate Malnutrition ?Etiology: chronic illness (dementia) ?Signs/Symptoms: moderate fat depletion, severe muscle depletion ?Interventions: Boost Breeze, Magic cup, Ensure Enlive (each supplement provides 350kcal and 20 grams of protein) ?  ?  ?Body mass index is 17.26 kg/m?.Marland Kitchen ?Nutrition Problem: Moderate Malnutrition ?Etiology: chronic illness (dementia) ?  ?DVT prophylaxis: Lovenox SQ  ?Code Status: DNR ? ?  ?Code Status Orders  ?(From admission, onward)  ?  ? ? ?  ? ?  Start     Ordered  ? 06/25/21 1730  Do not attempt resuscitation (DNR)  Continuous       ?  Question Answer Comment  ?In the event of cardiac or respiratory ARREST Do not call a ?code blue?   ?In the event of cardiac or respiratory ARREST Do not perform Intubation, CPR, defibrillation or ACLS   ?In the event of cardiac or respiratory ARREST Use medication by any route, position, wound care, and other measures to relive  pain and suffering. May use oxygen, suction and manual treatment of airway obstruction as needed for comfort.   ?  ? 06/25/21 1729  ? ?  ?  ? ?  ? ?Code Status History   ? ? Date Active Date Inactive Code Status Order ID Comments User Context  ? 06/25/2021 1703 06/25/2021 1729 Full Code [id number]  Wyvonnia Dusky, MD ED  ? 10/22/2020 2025 10/26/2020 2116 Full Code [id number]  Leslee Home, DO ED  ? 12/17/2018 2052 12/18/2018 1757 Full Code [id number]  Vaile Basta, MD Inpatient  ? ?  ? ?Family Communication: daughter at bedside  ?Disposition Plan:   no safe d/c plan identified yet ?Consults called: None ?Admission status: Inpatient ? ? ?Consultants:  ?None ? ?Procedures:  ?DG Chest 2 View ? ?Result Date: 06/25/2021 ?CLINICAL DATA:  Shortness of breath, anorexia, fatigue and weakness. EXAM: CHEST - 2 VIEW COMPARISON:  10/22/2020. FINDINGS: Trachea is midline. Heart size stable. Lungs are somewhat low in volume with mild bibasilar atelectasis. No definite pleural fluid. Moderate to large hiatal hernia. IMPRESSION: 1. Bibasilar atelectasis. 2. Moderate to large hiatal hernia. Electronically Signed   By: Lorin Picket M.D.   On: 06/25/2021 14:16  ? ?CT Head Wo Contrast ? ?Result Date: 06/15/2021 ?CLINICAL DATA:  Mental status change.  Unknown cause. EXAM: CT HEAD WITHOUT CONTRAST TECHNIQUE: Contiguous axial images were obtained from the base of the skull through the vertex without intravenous contrast. RADIATION DOSE REDUCTION: This exam was performed according to the departmental dose-optimization program which includes automated exposure control, adjustment of the mA and/or kV according to patient size and/or use of iterative reconstruction technique. COMPARISON:  CT brain 12/17/2018 FINDINGS: Brain: There is mild cortical atrophy, within normal limits for patient age. The ventricles are normal in configuration. The basilar cisterns are patent. No mass, mass effect, or midline shift. No acute intracranial  hemorrhage is seen. No abnormal extra-axial fluid collection. Mild-to-moderate periventricular white matter hypodensities, similar to prior and likely chronic ischemic white matter changes. Preservation of the normal cortical gray-white interface without CT evidence of an acute major vascular territorial cortical based infarction. Vascular: Atherosclerotic calcifications are seen within the major arteries at the skull base. No hyperdense vessel or unexpected calcification. Skull: Normal. Negative for fracture or focal lesion. Sinuses/Orbits: The visualized orbits are unremarkable. The visualized paranasal sinuses and mastoid air cells are clear. Other: None. IMPRESSION:: IMPRESSION: 1. No significant change from prior.  No acute intracranial process. 2. Mild cortical atrophy and mild to moderate chronic ischemic white matter changes. Electronically Signed   By: Yvonne Kendall M.D.   On: 06/15/2021 17:45   ? ?Antimicrobials:  ?Completed keflex  ? ? ?Subjective: ?Pt cannot privide meaningful info, no acute events overnight, daughter at bedsdie ? ?Objective: ?Vitals:  ? 06/30/21 2004 07/01/21 0414 07/01/21 0823 07/01/21 1145  ?BP: 140/62 (!) 149/60 (!) 151/68 121/62  ?Pulse: 81 72 75 78  ?Resp: '18 16 18 18  '$ ?Temp: 97.6 ?F (36.4 ?C) 97.9 ?F (36.6 ?C) 97.7 ?F (36.5 ?C) (!) 97.5 ?F (36.4 ?C)  ?TempSrc: Oral Oral  Oral  ?SpO2: 91% 93% 91% (!) 89%  ?Weight:      ?  Height:      ? ?No intake or output data in the 24 hours ending 07/01/21 1150 ?Filed Weights  ? 06/25/21 1149 06/25/21 1155 06/25/21 2043  ?Weight: 49 kg 49.9 kg 50 kg  ? ? ?Examination: ? ?General: Oriented x1-2, no acute distress ?HEENT: Normocephalic, atraumatic mucous membranes slightly dry ?Cardiovascular: Regular rate and rhythm, S1-S2, 2 out of 6 systolic ejection murmur ?Respiratory: Clear to auscultation bilaterally ?Abdomen: Soft, nontender ?Musculoskeletal: No clubbing or cyanosis, trace pitting edema ?Skin: No skin breaks, tears or lesions ?Psychiatry:  calm ?Neurology: No focal deficits ? ? ? ?Data Reviewed: I have personally reviewed following labs and imaging studies ? ?CBC: ?Recent Labs  ?Lab 06/26/21 ?4932 06/27/21 ?4199 06/28/21 ?1444 06/29/21 ?5848

## 2021-07-01 NOTE — NC FL2 (Addendum)
?[place] MEDICAID FL2 LEVEL OF CARE SCREENING TOOL  ?  ? ?IDENTIFICATION  ?Patient Name: ?Laura Wilcox Birthdate: 12/02/1926 Sex: female Admission Date (Current Location): ?06/25/2021  ?[place] and [place] Number: ? [place] ?  Facility and Address:  ?[facility], [address], [place], [place] [address] ?     Provider Number: ?[id number]  ?Attending Physician Name and Address:  ?Marcell Anger* ? Relative Name and Phone Number:  ?watts,tricia (Daughter)   [phone] [facility]) ?   ?Current Level of Care: ?Hospital Recommended Level of Care: ?[place], Memory Care Prior Approval Number: ?  ? ?Date Approved/Denied: ?  PASRR Number: ?  ? ?Discharge Plan: ?  ?  ? ?Current Diagnoses: ?Patient Active Problem List  ? Diagnosis Date Noted  ? Senile dementia with acute confusional state, without behavioral disturbance ([place]) 06/26/2021  ? Depression 06/26/2021  ? Weakness 06/26/2021  ? UTI (urinary tract infection) 06/25/2021  ? Myelofibrosis ([place]) 01/31/2020  ? Other pancytopenia ([place]) 01/31/2020  ? Moderate protein-calorie malnutrition ([place])   ? Squamous cell cancer of skin of left cheek 01/16/2020  ? Chronic kidney disease, stage 3b ([place]) 03/11/2019  ? Secondary hyperparathyroidism of renal origin ([place]) 03/11/2019  ? Anemia in chronic kidney disease 03/11/2019  ? Generalized weakness 12/17/2018  ? Elevated ferritin 02/18/2018  ? Goals of care, counseling/discussion 07/09/2017  ? Polycythemia rubra vera ([place]) 06/02/2017  ? Protein-calorie malnutrition, moderate ([place]) 06/02/2017  ? Advanced care planning/counseling discussion 06/02/2017  ? Age-related cognitive decline 06/02/2017  ? ? ?Orientation RESPIRATION BLADDER Height & Weight   ?  ?Self, Place ? Normal Continent Weight: 110 lb 3.7 oz (50 kg) ?Height:  '5\' 7"'$  (170.2 cm)  ?BEHAVIORAL SYMPTOMS/MOOD NEUROLOGICAL BOWEL NUTRITION STATUS  ?   (Dementia)   Diet (regular diet, thin liquids)  ?AMBULATORY STATUS  COMMUNICATION OF NEEDS Skin   ?Limited Assist Verbally [place] ?  ?  ?  ?    ?     ?     ? ? ?Personal Care Assistance Level of Assistance  ?Bathing, Feeding, Dressing Bathing Assistance: Limited assistance ?Feeding assistance: Limited assistance ?Dressing Assistance: Limited assistance ?   ? ?Functional Limitations Info  ?    ?  ?   ? ? ?SPECIAL CARE FACTORS FREQUENCY  ?    ?  ?  ?  ?  ?  ?  ?   ? ? ?Contractures    ? ? ?Additional Factors Info  ?Code Status, Allergies Code Status Info: DNR ?Allergies Info: bactrim, epinephrine ?  ?  ?  ?   ? ?  ?TAKE these medications   ?  ?acetaminophen 325 MG tablet ?Commonly known as: TYLENOL ?Take 325-650 mg by mouth every 6 (six) hours as needed for mild pain or moderate pain. ?   ?calcitRIOL 0.25 MCG capsule ?Commonly known as: ROCALTROL ?Take 0.25 mcg by mouth every Monday, Wednesday, and Friday. ?   ?citalopram 10 MG tablet ?Commonly known as: CELEXA ?Take 1 tablet (10 mg total) by mouth daily. ?   ?cyanocobalamin 1000 MCG tablet ?Take 1 tablet (1,000 mcg total) by mouth daily. ?Start taking on: July 03, 2021 ?   ?feeding supplement Liqd ?Take 237 mLs by mouth 2 (two) times daily between meals. ?   ?Jakafi 10 MG tablet ?Generic drug: ruxolitinib phosphate ?Take 1 tablet (10 mg total) by mouth 2 (two) times daily. ?   ?polyethylene glycol 17 g packet ?Commonly known as: MIRALAX / GLYCOLAX ?Take 17 g by mouth  daily as needed for mild constipation.  ? ? ?Discharge Medications: ?Please see discharge summary for a list of discharge medications. ? ?Relevant Imaging Results: ? ?Relevant Lab Results: ? ? ?Additional Information ?SS #: [phone] ? ?Meagan E Hagwood, LCSW ? ? ? ? ?

## 2021-07-02 DIAGNOSIS — N390 Urinary tract infection, site not specified: Secondary | ICD-10-CM | POA: Diagnosis not present

## 2021-07-02 DIAGNOSIS — R531 Weakness: Secondary | ICD-10-CM | POA: Diagnosis not present

## 2021-07-02 DIAGNOSIS — N1832 Chronic kidney disease, stage 3b: Secondary | ICD-10-CM | POA: Diagnosis not present

## 2021-07-02 DIAGNOSIS — F32A Depression, unspecified: Secondary | ICD-10-CM | POA: Diagnosis not present

## 2021-07-02 LAB — CBC WITH DIFFERENTIAL/PLATELET
Abs Immature Granulocytes: 0.83 10*3/uL — ABNORMAL HIGH (ref 0.00–0.07)
Basophils Absolute: 0.1 10*3/uL (ref 0.0–0.1)
Basophils Relative: 1 %
Eosinophils Absolute: 0.4 10*3/uL (ref 0.0–0.5)
Eosinophils Relative: 5 %
HCT: 28.9 % — ABNORMAL LOW (ref 36.0–46.0)
Hemoglobin: 9.1 g/dL — ABNORMAL LOW (ref 12.0–15.0)
Immature Granulocytes: 10 %
Lymphocytes Relative: 17 %
Lymphs Abs: 1.4 10*3/uL (ref 0.7–4.0)
MCH: 29 pg (ref 26.0–34.0)
MCHC: 31.5 g/dL (ref 30.0–36.0)
MCV: 92 fL (ref 80.0–100.0)
Monocytes Absolute: 0.8 10*3/uL (ref 0.1–1.0)
Monocytes Relative: 9 %
Neutro Abs: 4.7 10*3/uL (ref 1.7–7.7)
Neutrophils Relative %: 58 %
Platelets: 414 10*3/uL — ABNORMAL HIGH (ref 150–400)
RBC: 3.14 MIL/uL — ABNORMAL LOW (ref 3.87–5.11)
RDW: 21.6 % — ABNORMAL HIGH (ref 11.5–15.5)
Smear Review: NORMAL
WBC: 8.3 10*3/uL (ref 4.0–10.5)
nRBC: 0.8 % — ABNORMAL HIGH (ref 0.0–0.2)

## 2021-07-02 LAB — BASIC METABOLIC PANEL
Anion gap: 6 (ref 5–15)
BUN: 29 mg/dL — ABNORMAL HIGH (ref 8–23)
CO2: 27 mmol/L (ref 22–32)
Calcium: 8.6 mg/dL — ABNORMAL LOW (ref 8.9–10.3)
Chloride: 106 mmol/L (ref 98–111)
Creatinine, Ser: 1.05 mg/dL — ABNORMAL HIGH (ref 0.44–1.00)
GFR, Estimated: 50 mL/min — ABNORMAL LOW (ref >60)
Glucose, Bld: 86 mg/dL (ref 70–99)
Potassium: 3.9 mmol/L (ref 3.5–5.1)
Sodium: 139 mmol/L (ref 135–145)

## 2021-07-02 MED ORDER — ENSURE ENLIVE PO LIQD
237.0000 mL | Freq: Two times a day (BID) | ORAL | 12 refills | Status: DC
Start: 2021-07-02 — End: 2023-04-14

## 2021-07-02 MED ORDER — CYANOCOBALAMIN 1000 MCG PO TABS: 1000.0000 ug | ORAL_TABLET | Freq: Every day | ORAL | Status: DC

## 2021-07-02 MED ORDER — VITAMIN B-12 1000 MCG PO TABS
1000.0000 ug | ORAL_TABLET | Freq: Every day | ORAL | Status: DC
  Administered 2021-07-02: 1000 ug via ORAL
  Filled 2021-07-02: qty 1

## 2021-07-02 MED ORDER — POLYETHYLENE GLYCOL 3350 17 G PO PACK: 17.0000 g | PACK | Freq: Every day | ORAL | 0 refills | Status: DC | PRN

## 2021-07-02 NOTE — TOC Transition Note (Addendum)
Transition of Care (TOC) - CM/SW Discharge Note ? ? ?Patient Details  ?Name: Laura Wilcox ?MRN: [id number] ?Date of Birth: Jun 25, 1926 ? ?Transition of Care (TOC) CM/SW Contact:  ?Khoury Siemon A Amy Gothard, LCSW ?Phone Number: ?07/02/2021, 11:01 AM ? ? ?Clinical Narrative:   CSW sent FL2 with dc meds and dc summary via fax to [facility]- fax number confirmed ([phone]). Pt's daughter to transport to ALF. RN and MD aware. ? ?Per daughter she is going to ALF at 1:00 to do admissions ppwk and given them a check then she will come get pt and transport her. All ppwk is placed on chart. ? ?Final next level of care: Assisted Living ?Barriers to Discharge: No Barriers Identified ? ? ?Patient Goals and CMS Choice ?  ?  ?  ? ?Discharge Placement ?  ?           ?Patient [facility] bed at:  [facility]) ?Patient to be transferred to facility by: Pt's daughter ?Name of family member notified: pt's daughter ?Patient and family notified of of transfer: 07/02/21 ? ?Discharge Plan and Services ?  ?  ?           ?  ?  ?  ?  ?  ?  ?  ?  ?  ?  ? ?Social Determinants of Health (SDOH) Interventions ?  ? ? ?Readmission Risk Interventions ? ?  06/26/2021  ?  4:26 PM  ?Readmission Risk Prevention Plan  ?Transportation Screening Complete  ?PCP or Specialist Appt within 5-7 Days Complete  ?Home Care Screening Complete  ?Medication Review (RN CM) Complete  ? ? ? ? ? ?

## 2021-07-02 NOTE — TOC Progression Note (Signed)
Transition of Care (TOC) - Progression Note  ? ? ?Patient Details  ?Name: Terrian Ridlon ?MRN: [id number] ?Date of Birth: December 06, 1926 ? ?Transition of Care (TOC) CM/SW Contact  ?Lennie Dunnigan A Kiyan Burmester, LCSW ?Phone Number: ?07/02/2021, 10:10 AM ? ?Clinical Narrative:   [facility] spoke with [facility], they assessed her and will take her today. CSW has spoken to pt's daughter and she is agreeable. Pt's daughter will transport once dc is done. CSW spoke with [facility] and they just need the updated fl2 with dc meds and the dc summary to be sent with pt.  ? ? ? ?  ?  ? ?Expected Discharge Plan and Services ?  ?  ?  ?  ?  ?                ?  ?  ?  ?  ?  ?  ?  ?  ?  ?  ? ? ?Social Determinants of Health (SDOH) Interventions ?  ? ?Readmission Risk Interventions ? ?  06/26/2021  ?  4:26 PM  ?Readmission Risk Prevention Plan  ?Transportation Screening Complete  ?PCP or Specialist Appt within 5-7 Days Complete  ?Home Care Screening Complete  ?Medication Review (RN CM) Complete  ? ? ?

## 2021-07-02 NOTE — Discharge Summary (Signed)
?Physician Discharge Summary ?  ?Patient: Laura Wilcox MRN: [id number] DOB: November 14, 1926  ?Admit date:     06/25/2021  ?Discharge date: 07/02/21  ?Discharge Physician: Lorella Nimrod  ? ?PCP: Glean Hess, MD  ? ?Recommendations at discharge:  ?Please obtain CBC and BMP in 1 week ?Follow-up with primary care provider within a week ?Encourage p.o. intake including hydration. ? ?Discharge Diagnoses: ?Principal Problem: ?  UTI (urinary tract infection) ?Active Problems: ?  Generalized weakness ?  Senile dementia with acute confusional state, without behavioral disturbance ([place]) ?  Chronic kidney disease, stage 3b ([place]) ?  Anemia in chronic kidney disease ?  Polycythemia rubra vera ([place]) ?  Depression ?  Protein-calorie malnutrition, moderate ([place]) ?  Weakness ? ?Hospital Course: ?88 year old female with past medical history of dementia, polycythemia vera, depression and skin cancer who presented to the emergency room on 3/27 with complaints of weakness and poor p.o. intake with increased somnolence (as per patient's daughter) for the past 10 days.  Normally patient walks independently but for the previous week, has been using a wheelchair to get around.  In the emergency room, patient found to have possible UTI.  Started on fluids and IV Rocephin.  Brought in for further evaluation. ? ?Urine culture with Klebsiella pneumonia, patient initially received Rocephin followed by Keflex based on sensitivity result and completed the course of antibiotics. ? ?Her generalized weakness is felt to be more deconditioning and decline.  Patient was also found to have severe malnutrition and advanced dementia.  Poor p.o. intake. ?Family is interested in long term SNF/memory care unit where is she is being discharged for further management. ? ?Patient also has anemia of chronic disease.  Received 1 unit of blood transfusion as her worsening fatigue was thought to be due to symptomatic anemia. ? ?Patient need encouragement for p.o.  intake, can supplement diet with Ensure or boost. ?She also need encouragement for p.o. hydration as renal function improved with IV fluid. ?Renal function and hemoglobin at baseline. ? ?She will continue current management and will follow-up with her providers. ? ?Assessment and Plan: ?* UTI (urinary tract infection) ?Urine cultures with Klebsiella pneumonia, patient initially received ceftriaxone and later transitioned to Keflex based on sensitivity results and completed the treatment while in the hospital. ? ?Generalized weakness ?Unclear etiology.  Looks to more deconditioning and decline.  PT and OT recommending skilled nursing.  Family is interested in long-term skilled nursing/memory care unit.  Case management working on this.  Patient had a CT scan of her head done on 3/21 which was unremarkable.  Some of her immediate decline could be secondary to possible UTI and dehydration.  We will continue to follow ? ?Senile dementia with acute confusional state, without behavioral disturbance ([place]) ?Has been going on for some time.  Patient has refused evaluation in the past.  She lives at home with her daughter but daughter has been increasingly frustrated and patient may be a good candidate for memory care unit.  We will get TOC involved. ? ?Anemia in chronic kidney disease ?Mild drop from previous day although this was from hemoconcentration.  Transfuse for hemoglobin below 7. ? ?Chronic kidney disease, stage 3b ([place]) ?Based on previous labs, look to be at baseline.  However, following IV fluids, kidney function much improved from even previous lab checks.  This may be in part due to patient's very poor p.o. intake.  She is not on a diuretic.  Monitor closely. ? ?Polycythemia rubra vera ([place]) ?Patient actually anemic.  Monitor closely.  Also with thrombocytosis. ? ?Depression ?Continue Celexa. ? ?Protein-calorie malnutrition, moderate ([place]) ?BMI of 17.  Nutrition Status: ?Nutrition Problem: Moderate  Malnutrition ?Etiology: chronic illness (dementia) ?Signs/Symptoms: moderate fat depletion, severe muscle depletion ?Interventions: Boost Breeze, Magic cup, Ensure Enlive (each supplement provides 350kcal and 20 grams of protein) ? ? ? ? ?  ? ? ?Consultants: None ?Procedures performed: None ?Disposition: Skilled nursing facility ?Diet recommendation:  ?Discharge Diet Orders (From admission, onward)  ? ?  Start     Ordered  ? 07/02/21 0000  Diet - low sodium heart healthy       ? 07/02/21 1049  ? ?  ?  ? ?  ? ?Regular diet ?DISCHARGE MEDICATION: ?Allergies as of 07/02/2021   ? ?   Reactions  ? Bactrim [sulfamethoxazole-trimethoprim]   ? Due to kidney disease   ? Epinephrine Other (See Comments)  ? ?  ? ?  ?Medication List  ?  ? ?STOP taking these medications   ? ?aspirin EC 81 MG tablet ?  ?fluorouracil 5 % cream ?Commonly known as: EFUDEX ?  ?mupirocin ointment 2 % ?Commonly known as: BACTROBAN ?  ? ?  ? ?TAKE these medications   ? ?acetaminophen 325 MG tablet ?Commonly known as: TYLENOL ?Take 325-650 mg by mouth every 6 (six) hours as needed for mild pain or moderate pain. ?  ?calcitRIOL 0.25 MCG capsule ?Commonly known as: ROCALTROL ?Take 0.25 mcg by mouth every Monday, Wednesday, and Friday. ?  ?citalopram 10 MG tablet ?Commonly known as: CELEXA ?Take 1 tablet (10 mg total) by mouth daily. ?  ?cyanocobalamin 1000 MCG tablet ?Take 1 tablet (1,000 mcg total) by mouth daily. ?Start taking on: July 03, 2021 ?  ?feeding supplement Liqd ?Take 237 mLs by mouth 2 (two) times daily between meals. ?  ?Jakafi 10 MG tablet ?Generic drug: ruxolitinib phosphate ?Take 1 tablet (10 mg total) by mouth 2 (two) times daily. ?  ?polyethylene glycol 17 g packet ?Commonly known as: MIRALAX / GLYCOLAX ?Take 17 g by mouth daily as needed for mild constipation. ?  ? ?  ? ? Follow-up Information   ? ? Glean Hess, MD. Schedule an appointment as soon as possible for a visit in 1 week(s).   ?Specialty: Internal Medicine ?Contact  information: ?[address] ?[address] ?[place] [address] ?[phone] ? ? ?  ?  ? ?  ?  ? ?  ? ?Discharge Exam: ?Filed Weights  ? 06/25/21 1149 06/25/21 1155 06/25/21 2043  ?Weight: 49 kg 49.9 kg 50 kg  ? ?General.  Frail and malnourished elderly lady, in no acute distress. ?Pulmonary.  Lungs clear bilaterally, normal respiratory effort. ?CV.  Regular rate and rhythm, no JVD, rub or murmur. ?Abdomen.  Soft, nontender, nondistended, BS positive. ?CNS.  Alert and oriented to self.  No focal neurologic deficit. ?Extremities.  No edema, no cyanosis, pulses intact and symmetrical. ?Psychiatry.  Judgment and insight appears impaired ? ?Condition at discharge: stable ? ?The results of significant diagnostics from this hospitalization (including imaging, microbiology, ancillary and laboratory) are listed below for reference.  ? ?Imaging Studies: ?DG Chest 2 View ? ?Result Date: 06/25/2021 ?CLINICAL DATA:  Shortness of breath, anorexia, fatigue and weakness. EXAM: CHEST - 2 VIEW COMPARISON:  10/22/2020. FINDINGS: Trachea is midline. Heart size stable. Lungs are somewhat low in volume with mild bibasilar atelectasis. No definite pleural fluid. Moderate to large hiatal hernia. IMPRESSION: 1. Bibasilar atelectasis. 2. Moderate to large hiatal hernia. Electronically Signed  By: Lorin Picket M.D.   On: 06/25/2021 14:16  ? ?CT Head Wo Contrast ? ?Result Date: 06/15/2021 ?CLINICAL DATA:  Mental status change.  Unknown cause. EXAM: CT HEAD WITHOUT CONTRAST TECHNIQUE: Contiguous axial images were obtained from the base of the skull through the vertex without intravenous contrast. RADIATION DOSE REDUCTION: This exam was performed according to the departmental dose-optimization program which includes automated exposure control, adjustment of the mA and/or kV according to patient size and/or use of iterative reconstruction technique. COMPARISON:  CT brain 12/17/2018 FINDINGS: Brain: There is mild cortical atrophy, within normal  limits for patient age. The ventricles are normal in configuration. The basilar cisterns are patent. No mass, mass effect, or midline shift. No acute intracranial hemorrhage is seen. No abnormal extra-axial fluid collecti

## 2021-07-02 NOTE — Care Management Important Message (Signed)
Important Message ? ?Patient Details  ?Name: Laura Wilcox ?MRN: [id number] ?Date of Birth: [birth year] ? ? ?Medicare Important Message Given:  Yes ? ?I reviewed the Important Message from Medicare with the patients daughter, Arrie Aran [phone] and she is in agreement with her being discharged today to the memory care unit.  I asked her if she would like to have a copy of the form but she said it wasn't necessary. I thanked her for her time.  ? ? ?Juliann Pulse A Bryer Cozzolino ?07/02/2021, 12:22 PM ?

## 2021-07-04 ENCOUNTER — Telehealth: Payer: Self-pay

## 2021-07-04 ENCOUNTER — Telehealth: Payer: Self-pay | Admitting: *Deleted

## 2021-07-04 LAB — QUANTIFERON-TB GOLD PLUS: QuantiFERON-TB Gold Plus: NEGATIVE

## 2021-07-04 LAB — QUANTIFERON-TB GOLD PLUS (RQFGPL)
QuantiFERON Mitogen Value: 4.67 IU/mL
QuantiFERON Nil Value: 0.06 IU/mL
QuantiFERON TB1 Ag Value: 0.06 IU/mL
QuantiFERON TB2 Ag Value: 0.05 IU/mL

## 2021-07-04 NOTE — Telephone Encounter (Signed)
Returned call to daughter, [name]. Per [name], patient was recently discharged from hospital after 8 days. During her stay at the hospital, patient did not take her [place]. [name] has noticed blood counts have decreased since. She is wanting Dr. Tasia Catchings to look at labs to see if patient is ok with current dose of 10 mg bid. Dr. Tasia Catchings please advise.  ?

## 2021-07-04 NOTE — Telephone Encounter (Signed)
Per Dr. Tasia Catchings to hold 10 mg of Laura Wilcox until her next appointment on 07/18/2021. Informed patient's daughter. Patient daughter verbalized understanding.Laura Wilcox asked if I could let [facility] know. [facility] and they requested a fax stating patient to hold medication until next appt. I faxed over to [place].  ?

## 2021-07-04 NOTE — Telephone Encounter (Signed)
Daughter called to report that patient is going to be going to a memory care facility and she has questions regarding patient Jakafi. Please return her call ?

## 2021-07-05 ENCOUNTER — Non-Acute Institutional Stay: Payer: Medicare Other | Admitting: Student

## 2021-07-05 DIAGNOSIS — F0392 Unspecified dementia, unspecified severity, with psychotic disturbance: Secondary | ICD-10-CM | POA: Diagnosis not present

## 2021-07-05 DIAGNOSIS — Z515 Encounter for palliative care: Secondary | ICD-10-CM

## 2021-07-05 DIAGNOSIS — F320 Major depressive disorder, single episode, mild: Secondary | ICD-10-CM

## 2021-07-05 DIAGNOSIS — R531 Weakness: Secondary | ICD-10-CM

## 2021-07-05 DIAGNOSIS — D45 Polycythemia vera: Secondary | ICD-10-CM | POA: Diagnosis not present

## 2021-07-05 NOTE — Progress Notes (Signed)
? ? ?Manufacturing engineer ?Community Palliative Care Consult Note ?Telephone: [phone]  ?Fax: [phone]  ? ?Date of encounter: 07/05/21 ?4:57 PM ?PATIENT NAME: Laura Wilcox ?[address] ?[place] [address]   ?[phone] (home)  ?DOB: Feb 21, 1927 ?MRN: [id number] ?PRIMARY CARE PROVIDER:    ?Glean Hess, MD,  ?[address] ?[place] [address] ?[phone] ? ?REFERRING PROVIDER:   ?Glean Hess, MD ?[address] ?[address] ?[place],  [place] [address] ?[phone] ? ?RESPONSIBLE PARTY:    ?Contact Information   ? ? Name Relation Home Work Mobile  ? watts,tricia Daughter   [phone]  ? OTHA, RICKLES Son   [phone]  ? watts,sherwood Relative   [phone]  ? ?  ? ? ? ?I met face to face with patient and family in the facility. Palliative Care was asked to follow this patient by consultation request of  Glean Hess, MD to address advance care planning and complex medical decision making. This is the initial visit.  ? ? ?                                 ASSESSMENT AND PLAN / RECOMMENDATIONS:  ? ?Advance Care Planning/Goals of Care: Goals include to maximize quality of life and symptom management. Patient/health care surrogate gave his/her permission to discuss.Our advance care planning conversation included a discussion about:    ?The value and importance of advance care planning  ?Experiences with loved ones who have been seriously ill or have died  ?Exploration of personal, cultural or spiritual beliefs that might influence medical decisions  ?Exploration of goals of care in the event of a sudden injury or illness  ?CODE STATUS: DNR ? ?Message left for daughter to discuss goals of care. Education on palliative medicine. Will continue to provide ongoing support and symptom management.  ? ?Symptom Management/Plan: ? ?Dementia-patient recently transitioned to AL on memory unit. Reorient and redirect as needed. Monitor for falls/safety. Monitor for  adjustment to new environment. ? ?Depression-monitor for worsening depression, changes in behavior. Continue citalopram as directed. ? ?Polycythema vera-patient's Jakafi currently on hold until she follows up with oncology. Follow up with oncology as scheduled.  ? ?Generalized weakness-encourage walker for ambulation. She is to start therapy at facility.  ? ?Follow up Palliative Care Visit: Palliative care will continue to follow for complex medical decision making, advance care planning, and clarification of goals. Return in 4-6 weeks or prn. ? ? ?This visit was coded based on medical decision making (MDM). ? ?PPS: 50% ? ?HOSPICE ELIGIBILITY/DIAGNOSIS: TBD ? ?Chief Complaint: Palliative Medicine initial consult.  ? ?HISTORY OF PRESENT ILLNESS:  Laura Wilcox is a 88 y.o. year old female  with dementia, protein calorie malnutrition, weakness, depression, CKD 3b, polycythemia vera, anemia of chronic disease, skin cancer. Patient recently hospitalized 3/27-07/02/21 due to UTI, weakness.  ? ?Patient recently moved to [facility], dementia unit. She requires frequent redirection as she expresses wanting to go home. She denies pain, shortness of breath, constipation, nausea. She does endorse weakness. Fair appetite per staff. She declined lunch today as she ate a late breakfast. ? ?Patient received resting in her room. She is alert and oriented to person. She thinks she is at [facility] for rehab. She has pleasant affect, cooperative with assessment.  ? ?History obtained from review of EMR, discussion with primary team, and interview with family, facility staff/caregiver and/or Ms. [name].  ?I  reviewed available labs, medications, imaging, studies and related documents from the EMR.  Records reviewed and summarized above.  ? ? ?Physical Exam: ?Pulse 92, resp 20, b/p 130/64, sats 95% on room air ?Constitutional: NAD ?General: frail appearing, thin ?EYES: anicteric sclera, lids intact, no discharge  ?ENMT:  intact hearing, oral mucous membranes moist, dentition intact ?CV: S1S2, RRR, no LE edema ?Pulmonary: LCTA, no increased work of breathing, no cough, room air ?Abdomen: normo-active BS + 4 quadrants, soft and non tender ?GU: deferred ?MSK: + sarcopenia, moves all extremities, ambulatory ?Skin: warm and dry, no rashes, dry, raised lesion to left cheek ?Neuro: + generalized weakness,  A & O to person ?Psych: non-anxious affect ?Hem/lymph/immuno: no widespread bruising ?CURRENT PROBLEM LIST:  ?Patient Active Problem List  ? Diagnosis Date Noted  ? Senile dementia with acute confusional state, without behavioral disturbance ([place]) 06/26/2021  ? Depression 06/26/2021  ? Weakness 06/26/2021  ? UTI (urinary tract infection) 06/25/2021  ? Myelofibrosis ([place]) 01/31/2020  ? Other pancytopenia ([place]) 01/31/2020  ? Moderate protein-calorie malnutrition ([place])   ? Squamous cell cancer of skin of left cheek 01/16/2020  ? Chronic kidney disease, stage 3b ([place]) 03/11/2019  ? Secondary hyperparathyroidism of renal origin ([place]) 03/11/2019  ? Anemia in chronic kidney disease 03/11/2019  ? Generalized weakness 12/17/2018  ? Elevated ferritin 02/18/2018  ? Goals of care, counseling/discussion 07/09/2017  ? Polycythemia rubra vera ([place]) 06/02/2017  ? Protein-calorie malnutrition, moderate ([place]) 06/02/2017  ? Advanced care planning/counseling discussion 06/02/2017  ? Age-related cognitive decline 06/02/2017  ? ?PAST MEDICAL HISTORY:  ?Active Ambulatory Problems  ?  Diagnosis Date Noted  ? Polycythemia rubra vera ([place]) 06/02/2017  ? Protein-calorie malnutrition, moderate ([place]) 06/02/2017  ? Advanced care planning/counseling discussion 06/02/2017  ? Age-related cognitive decline 06/02/2017  ? Goals of care, counseling/discussion 07/09/2017  ? Elevated ferritin 02/18/2018  ? Generalized weakness 12/17/2018  ? Squamous cell cancer of skin of left cheek 01/16/2020  ? Chronic kidney disease, stage 3b ([place]) 03/11/2019  ? Secondary  hyperparathyroidism of renal origin ([place]) 03/11/2019  ? Anemia in chronic kidney disease 03/11/2019  ? Myelofibrosis ([place]) 01/31/2020  ? Other pancytopenia ([place]) 01/31/2020  ? Moderate protein-calorie malnutrition ([place])   ? UTI (urinary tract infection) 06/25/2021  ? Senile dementia with acute confusional state, without behavioral disturbance ([place]) 06/26/2021  ? Depression 06/26/2021  ? Weakness 06/26/2021  ? ?Resolved Ambulatory Problems  ?  Diagnosis Date Noted  ? Urticaria 06/02/2017  ? Confusion 12/30/2018  ? Urinary tract infection without hematuria 01/02/2020  ? Acute metabolic encephalopathy 33/61/2244  ? ?Past Medical History:  ?Diagnosis Date  ? Polycythemia   ? SCC (squamous cell carcinoma) 04/11/2021  ? Squamous cell carcinoma of skin 09/06/2020  ? ?SOCIAL HX:  ?Social History  ? ?Tobacco Use  ? Smoking status: Never  ? Smokeless tobacco: Never  ?Substance Use Topics  ? Alcohol use: No  ?  Comment: occasional glass of wine  ? ?FAMILY HX:  ?Family History  ?Problem Relation Age of Onset  ? Heart disease Father   ? Cancer Sister   ?     breast  ? Cancer Sister   ?     breast  ? Alzheimer's disease Sister   ?   ? ?ALLERGIES:  ?Allergies  ?Allergen Reactions  ? Bactrim [Sulfamethoxazole-Trimethoprim]   ?  Due to kidney disease   ? Epinephrine Other (See Comments)  ?   ?PERTINENT MEDICATIONS:  ?Outpatient Encounter Medications as of 07/05/2021  ?Medication Sig  ?  acetaminophen (TYLENOL) 325 MG tablet Take 325-650 mg by mouth every 6 (six) hours as needed for mild pain or moderate pain. (Patient not taking: Reported on 06/18/2021)  ? calcitRIOL (ROCALTROL) 0.25 MCG capsule Take 0.25 mcg by mouth every Monday, Wednesday, and Friday.  ? citalopram (CELEXA) 10 MG tablet Take 1 tablet (10 mg total) by mouth daily.  ? feeding supplement (ENSURE ENLIVE / ENSURE PLUS) LIQD Take 237 mLs by mouth 2 (two) times daily between meals.  ? polyethylene glycol (MIRALAX / GLYCOLAX) 17 g packet Take 17 g by mouth daily as needed  for mild constipation.  ? ruxolitinib phosphate (JAKAFI) 10 MG tablet Take 1 tablet (10 mg total) by mouth 2 (two) times daily.  ? vitamin B-12 1000 MCG tablet Take 1 tablet (1,000 mcg total) by mouth daily.  ? ?No facility-adm

## 2021-07-05 NOTE — Telephone Encounter (Signed)
error 

## 2021-07-09 ENCOUNTER — Other Ambulatory Visit (HOSPITAL_COMMUNITY): Payer: Self-pay

## 2021-07-11 ENCOUNTER — Other Ambulatory Visit (HOSPITAL_COMMUNITY): Payer: Self-pay

## 2021-07-12 ENCOUNTER — Telehealth: Payer: Self-pay | Admitting: *Deleted

## 2021-07-12 ENCOUNTER — Encounter: Payer: Self-pay | Admitting: Hospice and Palliative Medicine

## 2021-07-12 ENCOUNTER — Ambulatory Visit: Payer: PRIVATE HEALTH INSURANCE | Admitting: Dermatology

## 2021-07-12 NOTE — Telephone Encounter (Signed)
Laura Wilcox patient daughter called reporting that patient is now in a memory care facility and is asking if Palliative Care could go there to drawn her labs, She is also asking to speak with Sharion Dove, NP regarding her new life changes.  ?She is scheduled for the 19 th for lab/ doctor /injection /palliative care ?

## 2021-07-13 NOTE — Telephone Encounter (Signed)
Dr Tasia Catchings daughter called back today asking again about patient appts, she is asking if labs could be drawn at the facility [facility] prior to her appointment Wednesday. She is also asking if the appointment for Wednesday can be moved out a week so that her brother can come to help her get patient who is not adjusting well to being in a facility to our office. Please advise ?

## 2021-07-13 NOTE — Telephone Encounter (Signed)
Dr. Tasia Catchings, the appts were changed to 4/26. She will have labs and see you. ?Anderson Malta called the daughter and got her voicemail and also sent my chart with the same thing as the voicemail for the daughter. ?Thanks ?Sherry ?

## 2021-07-17 DIAGNOSIS — G459 Transient cerebral ischemic attack, unspecified: Secondary | ICD-10-CM | POA: Diagnosis not present

## 2021-07-17 DIAGNOSIS — M6281 Muscle weakness (generalized): Secondary | ICD-10-CM | POA: Diagnosis not present

## 2021-07-17 DIAGNOSIS — N1832 Chronic kidney disease, stage 3b: Secondary | ICD-10-CM | POA: Diagnosis not present

## 2021-07-17 DIAGNOSIS — R262 Difficulty in walking, not elsewhere classified: Secondary | ICD-10-CM | POA: Diagnosis not present

## 2021-07-17 DIAGNOSIS — R2681 Unsteadiness on feet: Secondary | ICD-10-CM | POA: Diagnosis not present

## 2021-07-17 DIAGNOSIS — R2689 Other abnormalities of gait and mobility: Secondary | ICD-10-CM | POA: Diagnosis not present

## 2021-07-17 DIAGNOSIS — F02C0 Dementia in other diseases classified elsewhere, severe, without behavioral disturbance, psychotic disturbance, mood disturbance, and anxiety: Secondary | ICD-10-CM | POA: Diagnosis not present

## 2021-07-17 DIAGNOSIS — R278 Other lack of coordination: Secondary | ICD-10-CM | POA: Diagnosis not present

## 2021-07-18 ENCOUNTER — Inpatient Hospital Stay: Payer: Medicare Other

## 2021-07-18 ENCOUNTER — Inpatient Hospital Stay: Payer: Medicare Other | Admitting: Oncology

## 2021-07-18 ENCOUNTER — Inpatient Hospital Stay: Payer: Medicare Other | Admitting: Hospice and Palliative Medicine

## 2021-07-18 DIAGNOSIS — R2681 Unsteadiness on feet: Secondary | ICD-10-CM | POA: Diagnosis not present

## 2021-07-18 DIAGNOSIS — N1832 Chronic kidney disease, stage 3b: Secondary | ICD-10-CM | POA: Diagnosis not present

## 2021-07-18 DIAGNOSIS — F02C0 Dementia in other diseases classified elsewhere, severe, without behavioral disturbance, psychotic disturbance, mood disturbance, and anxiety: Secondary | ICD-10-CM | POA: Diagnosis not present

## 2021-07-18 DIAGNOSIS — G459 Transient cerebral ischemic attack, unspecified: Secondary | ICD-10-CM | POA: Diagnosis not present

## 2021-07-18 DIAGNOSIS — M6281 Muscle weakness (generalized): Secondary | ICD-10-CM | POA: Diagnosis not present

## 2021-07-18 DIAGNOSIS — R262 Difficulty in walking, not elsewhere classified: Secondary | ICD-10-CM | POA: Diagnosis not present

## 2021-07-19 ENCOUNTER — Other Ambulatory Visit (HOSPITAL_COMMUNITY): Payer: Self-pay

## 2021-07-19 DIAGNOSIS — R2681 Unsteadiness on feet: Secondary | ICD-10-CM | POA: Diagnosis not present

## 2021-07-19 DIAGNOSIS — M6281 Muscle weakness (generalized): Secondary | ICD-10-CM | POA: Diagnosis not present

## 2021-07-19 DIAGNOSIS — F02C0 Dementia in other diseases classified elsewhere, severe, without behavioral disturbance, psychotic disturbance, mood disturbance, and anxiety: Secondary | ICD-10-CM | POA: Diagnosis not present

## 2021-07-19 DIAGNOSIS — R262 Difficulty in walking, not elsewhere classified: Secondary | ICD-10-CM | POA: Diagnosis not present

## 2021-07-19 DIAGNOSIS — G459 Transient cerebral ischemic attack, unspecified: Secondary | ICD-10-CM | POA: Diagnosis not present

## 2021-07-19 DIAGNOSIS — N1832 Chronic kidney disease, stage 3b: Secondary | ICD-10-CM | POA: Diagnosis not present

## 2021-07-23 ENCOUNTER — Other Ambulatory Visit (HOSPITAL_COMMUNITY): Payer: Self-pay

## 2021-07-23 DIAGNOSIS — G459 Transient cerebral ischemic attack, unspecified: Secondary | ICD-10-CM | POA: Diagnosis not present

## 2021-07-23 DIAGNOSIS — F02C0 Dementia in other diseases classified elsewhere, severe, without behavioral disturbance, psychotic disturbance, mood disturbance, and anxiety: Secondary | ICD-10-CM | POA: Diagnosis not present

## 2021-07-23 DIAGNOSIS — R262 Difficulty in walking, not elsewhere classified: Secondary | ICD-10-CM | POA: Diagnosis not present

## 2021-07-23 DIAGNOSIS — R2681 Unsteadiness on feet: Secondary | ICD-10-CM | POA: Diagnosis not present

## 2021-07-23 DIAGNOSIS — M6281 Muscle weakness (generalized): Secondary | ICD-10-CM | POA: Diagnosis not present

## 2021-07-23 DIAGNOSIS — N1832 Chronic kidney disease, stage 3b: Secondary | ICD-10-CM | POA: Diagnosis not present

## 2021-07-24 DIAGNOSIS — G459 Transient cerebral ischemic attack, unspecified: Secondary | ICD-10-CM | POA: Diagnosis not present

## 2021-07-24 DIAGNOSIS — F02C0 Dementia in other diseases classified elsewhere, severe, without behavioral disturbance, psychotic disturbance, mood disturbance, and anxiety: Secondary | ICD-10-CM | POA: Diagnosis not present

## 2021-07-24 DIAGNOSIS — N1832 Chronic kidney disease, stage 3b: Secondary | ICD-10-CM | POA: Diagnosis not present

## 2021-07-24 DIAGNOSIS — M6281 Muscle weakness (generalized): Secondary | ICD-10-CM | POA: Diagnosis not present

## 2021-07-24 DIAGNOSIS — R2681 Unsteadiness on feet: Secondary | ICD-10-CM | POA: Diagnosis not present

## 2021-07-24 DIAGNOSIS — R262 Difficulty in walking, not elsewhere classified: Secondary | ICD-10-CM | POA: Diagnosis not present

## 2021-07-25 ENCOUNTER — Inpatient Hospital Stay: Payer: Medicare Other

## 2021-07-25 ENCOUNTER — Inpatient Hospital Stay: Payer: Medicare Other | Attending: Oncology

## 2021-07-25 ENCOUNTER — Inpatient Hospital Stay (HOSPITAL_BASED_OUTPATIENT_CLINIC_OR_DEPARTMENT_OTHER): Payer: Medicare Other | Admitting: Hospice and Palliative Medicine

## 2021-07-25 ENCOUNTER — Inpatient Hospital Stay (HOSPITAL_BASED_OUTPATIENT_CLINIC_OR_DEPARTMENT_OTHER): Payer: Medicare Other | Admitting: Oncology

## 2021-07-25 ENCOUNTER — Ambulatory Visit
Admission: RE | Admit: 2021-07-25 | Discharge: 2021-07-25 | Disposition: A | Discharge status: Home / Self Care | Payer: Medicare Other | Source: Ambulatory Visit | Attending: Oncology | Admitting: Oncology

## 2021-07-25 ENCOUNTER — Other Ambulatory Visit (HOSPITAL_COMMUNITY): Payer: Self-pay

## 2021-07-25 ENCOUNTER — Encounter: Payer: Self-pay | Admitting: Oncology

## 2021-07-25 VITALS — BP 136/71 | HR 81 | Temp 97.3°F | Wt 109.6 lb

## 2021-07-25 DIAGNOSIS — D45 Polycythemia vera: Secondary | ICD-10-CM | POA: Insufficient documentation

## 2021-07-25 DIAGNOSIS — F0390 Unspecified dementia without behavioral disturbance: Secondary | ICD-10-CM | POA: Insufficient documentation

## 2021-07-25 DIAGNOSIS — N1832 Chronic kidney disease, stage 3b: Secondary | ICD-10-CM

## 2021-07-25 DIAGNOSIS — Z7189 Other specified counseling: Secondary | ICD-10-CM | POA: Diagnosis not present

## 2021-07-25 DIAGNOSIS — R4189 Other symptoms and signs involving cognitive functions and awareness: Secondary | ICD-10-CM | POA: Diagnosis not present

## 2021-07-25 DIAGNOSIS — D631 Anemia in chronic kidney disease: Secondary | ICD-10-CM | POA: Insufficient documentation

## 2021-07-25 DIAGNOSIS — Z881 Allergy status to other antibiotic agents status: Secondary | ICD-10-CM | POA: Diagnosis not present

## 2021-07-25 DIAGNOSIS — Z85828 Personal history of other malignant neoplasm of skin: Secondary | ICD-10-CM | POA: Diagnosis not present

## 2021-07-25 DIAGNOSIS — D7581 Myelofibrosis: Secondary | ICD-10-CM | POA: Insufficient documentation

## 2021-07-25 DIAGNOSIS — Z79899 Other long term (current) drug therapy: Secondary | ICD-10-CM | POA: Diagnosis not present

## 2021-07-25 DIAGNOSIS — R2681 Unsteadiness on feet: Secondary | ICD-10-CM | POA: Diagnosis not present

## 2021-07-25 DIAGNOSIS — Z87440 Personal history of urinary (tract) infections: Secondary | ICD-10-CM | POA: Diagnosis not present

## 2021-07-25 DIAGNOSIS — Z818 Family history of other mental and behavioral disorders: Secondary | ICD-10-CM | POA: Diagnosis not present

## 2021-07-25 DIAGNOSIS — M7989 Other specified soft tissue disorders: Secondary | ICD-10-CM | POA: Diagnosis not present

## 2021-07-25 DIAGNOSIS — Z803 Family history of malignant neoplasm of breast: Secondary | ICD-10-CM | POA: Insufficient documentation

## 2021-07-25 DIAGNOSIS — R531 Weakness: Secondary | ICD-10-CM | POA: Diagnosis not present

## 2021-07-25 DIAGNOSIS — D709 Neutropenia, unspecified: Secondary | ICD-10-CM | POA: Insufficient documentation

## 2021-07-25 DIAGNOSIS — G459 Transient cerebral ischemic attack, unspecified: Secondary | ICD-10-CM | POA: Diagnosis not present

## 2021-07-25 DIAGNOSIS — F02C0 Dementia in other diseases classified elsewhere, severe, without behavioral disturbance, psychotic disturbance, mood disturbance, and anxiety: Secondary | ICD-10-CM | POA: Diagnosis not present

## 2021-07-25 DIAGNOSIS — Z8249 Family history of ischemic heart disease and other diseases of the circulatory system: Secondary | ICD-10-CM | POA: Insufficient documentation

## 2021-07-25 DIAGNOSIS — M6281 Muscle weakness (generalized): Secondary | ICD-10-CM | POA: Diagnosis not present

## 2021-07-25 DIAGNOSIS — R262 Difficulty in walking, not elsewhere classified: Secondary | ICD-10-CM | POA: Diagnosis not present

## 2021-07-25 LAB — COMPREHENSIVE METABOLIC PANEL
ALT: 11 U/L (ref 0–44)
AST: 16 U/L (ref 15–41)
Albumin: 4 g/dL (ref 3.5–5.0)
Alkaline Phosphatase: 101 U/L (ref 38–126)
Anion gap: 6 (ref 5–15)
BUN: 26 mg/dL — ABNORMAL HIGH (ref 8–23)
CO2: 26 mmol/L (ref 22–32)
Calcium: 9 mg/dL (ref 8.9–10.3)
Chloride: 101 mmol/L (ref 98–111)
Creatinine, Ser: 1.47 mg/dL — ABNORMAL HIGH (ref 0.44–1.00)
GFR, Estimated: 33 mL/min — ABNORMAL LOW (ref >60)
Glucose, Bld: 165 mg/dL — ABNORMAL HIGH (ref 70–99)
Potassium: 4.2 mmol/L (ref 3.5–5.1)
Sodium: 133 mmol/L — ABNORMAL LOW (ref 135–145)
Total Bilirubin: 0.1 mg/dL — ABNORMAL LOW (ref 0.3–1.2)
Total Protein: 7.2 g/dL (ref 6.5–8.1)

## 2021-07-25 LAB — CBC WITH DIFFERENTIAL/PLATELET
Abs Immature Granulocytes: 0.68 10*3/uL — ABNORMAL HIGH (ref 0.00–0.07)
Basophils Absolute: 0.2 10*3/uL — ABNORMAL HIGH (ref 0.0–0.1)
Basophils Relative: 1 %
Eosinophils Absolute: 0.5 10*3/uL (ref 0.0–0.5)
Eosinophils Relative: 4 %
HCT: 35.7 % — ABNORMAL LOW (ref 36.0–46.0)
Hemoglobin: 10.8 g/dL — ABNORMAL LOW (ref 12.0–15.0)
Immature Granulocytes: 5 %
Lymphocytes Relative: 10 %
Lymphs Abs: 1.4 10*3/uL (ref 0.7–4.0)
MCH: 28.7 pg (ref 26.0–34.0)
MCHC: 30.3 g/dL (ref 30.0–36.0)
MCV: 94.9 fL (ref 80.0–100.0)
Monocytes Absolute: 1.1 10*3/uL — ABNORMAL HIGH (ref 0.1–1.0)
Monocytes Relative: 8 %
Neutro Abs: 10.5 10*3/uL — ABNORMAL HIGH (ref 1.7–7.7)
Neutrophils Relative %: 72 %
Platelets: 762 10*3/uL — ABNORMAL HIGH (ref 150–400)
RBC: 3.76 MIL/uL — ABNORMAL LOW (ref 3.87–5.11)
RDW: 23.1 % — ABNORMAL HIGH (ref 11.5–15.5)
WBC: 14.5 10*3/uL — ABNORMAL HIGH (ref 4.0–10.5)
nRBC: 0.3 % — ABNORMAL HIGH (ref 0.0–0.2)

## 2021-07-25 LAB — LACTATE DEHYDROGENASE: LDH: 189 U/L (ref 98–192)

## 2021-07-25 MED ORDER — RUXOLITINIB PHOSPHATE 5 MG PO TABS: 5.0000 mg | ORAL_TABLET | Freq: Every day | ORAL | 1 refills | Status: DC

## 2021-07-25 MED ORDER — ASPIRIN EC 81 MG PO TBEC: 81.0000 mg | DELAYED_RELEASE_TABLET | Freq: Every day | ORAL | 11 refills | Status: DC

## 2021-07-25 NOTE — Progress Notes (Signed)
?Hematology Oncology Progress Note ? ? ?Clinic Day:  07/25/2021  ? ?Referring physician: Earlie Server, MD ? ?Chief Complaint: Laura Wilcox is a 88 y.o. female with polycythemia vera (PV) and secondary myelofibrosis presents for follow up  ? ?PERTINENT ONCOLOGY HISTORY ?Laura Wilcox is a 88 y.o.afemale who has above oncology history reviewed by me today presented for follow up visit for management of  polycythemia vera  ?Patient previously followed up by Dr.Corcoran, patient switched care to me on 11/16/20 ?Extensive medical record review was performed by me ? ?Feb 2014 polycythemia vera and secondary myelofibrosis.  She presented in 04/2002 with thrombocytosis (835,000).  JAK2 V617F was positive ? ? Bone marrow was on 04/15/2002 revealed a normocellular marrow for age with trilineage hematopoiesis and mild megakaryocytic hyperplasia with focal atypia.  There was no morphologic evidence of a myeloproliferative disorder.  Iron stains were inadequate for evaluation of storage iron.  There was no increase in reticulin fibers.  She was started on Agrylin in 2012  ?Bone marrow on 09/25/2012 revealed persistent myeloproliferative disorder s/p Agrylin.  The current features were best classified as persistent myeloproliferative neoplasm (MPN) JAK2 V617F mutation positive.  The current features of markedly increased atypical megakaryocytes paresis, with frequent clustering with frequent hyper/deeply lobulated forms, bizarre forms and forms with staghorn nuclei, hyperchromatic forms and abundant cytoplasm was highly suggestive of a myeloproliferative neoplasm.  Marrow was hypercellular for age (30-50%) with increased trilineage hematopoiesis and markedly increased atypical megakaryopoiesis with frequent clustering.  There were no increased blasts.  Iron stores were absent.  There was moderate to severe myelofibrosis (grade MF 2-3).  Differential includes ET, PV, primary myelofibrosis, and MPN, unclassifiable.  Cytogenetics  were normal (4, XX). ?She developed pancytopenia in 12/2015.  Etiology is unclear (medication confusion or hydroxyurea + Agrylin). ?  ?She received Procrit 40,000 units (intermittently 09/2012 - 06/2016), Venofer (09/2012 - 10/2012), and Infed (12/2015 - 03/2016).  She received Neupogen during a period of neutropenia.  ? ?She was anagrelide from 2004 - 08/13/2017.  She began [place] on 08/14/2017.  Jakafi was increased from 5 mg BID to 10 mg BID on 06/17/2018.  Jakafi was increased to 15 mg BID on 08/03/2018.  She has been back on Jakafi 5 mg BID since 02/01/2019 then 10 mg BID on 05/25/2019.  [name] was on hold from 12/20/2019 - 01/12/2020.  Jakafi 5 mg twice daily restarted on 01/12/2020 then increased to 10 mg in the morning and 5 mg in the evening on 02/02/2020. [name] is currently 20 mg po BID.  She has received Retacrit to maintain a hemoglobin > 10 (12/24/2019 and 03 ? ? ?Squamous cell carcinoma left cheek- s/p cryotherapy and debridement. Managed by Dr. Nehemiah Massed ? ?10/22/2020- 10/26/2020 Hospitalization due to acute metabolic encephalopathy, UTI,E. coli bacteremia AKI on CKD, acute on chronic anemia. [name] was held during the admission. She was discharged and spent a short period of time in rehab. [name] was held.  ? ?11/06/2020 seen by symptom management for weakness.  ?11/16/2020 established care with me. Resumed Jakafi at '10mg'$  BID ? ?#Dementia/cognitive impairment- patient has hx of dementia. ?Per daughter, it has been difficult to convince patent to see neurology for further evaluation. She is not on any medication for her memory loss.  She has been referred to home health.  ? ?INTERVAL HISTORY ?Laura Wilcox is a 88 y.o. female who has above history reviewed by me today presents for follow up visit for management of polycythemia vera and secondary myelofibrosis.   ?Patient was  accompanied by daughter [name] And the patient's son. ?06/25/2021 - 07/02/2021,Patient was hospitalized due to UTI, generalized  weakness Jakafi was.  Held. ?Today patient was accompanied by family members for reevaluation and discussion of treatment of polycythemia/secondary myelofibrosis. ?Patient is a poor historian.  She denies any pain.  She currently lives in long-term memory care facility. ?+ Family has noticed bilateral lower extremity edema since yesterday. ? ?Past Medical History:  ?Diagnosis Date  ? Acute metabolic encephalopathy 44/31/5400  ? Confusion 12/30/2018  ? Polycythemia   ? SCC (squamous cell carcinoma) 04/11/2021  ? R med dorsum hand - ED&C SCCIS  ? Squamous cell carcinoma of skin 09/06/2020  ? R thumb - ED&C  ? Urinary tract infection without hematuria 01/02/2020  ? Urticaria 06/02/2017  ? ? ?Past Surgical History:  ?Procedure Laterality Date  ? ABDOMINAL HYSTERECTOMY    ? complete  ? BREAST SURGERY    ? ? ?Family History  ?Problem Relation Age of Onset  ? Heart disease Father   ? Cancer Sister   ?     breast  ? Cancer Sister   ?     breast  ? Alzheimer's disease Sister   ? ? ?Social History:  reports that she has never smoked. She has never used smokeless tobacco. She reports that she does not drink alcohol and does not use drugs.  ? ?Allergies:  ?Allergies  ?Allergen Reactions  ? Bactrim [Sulfamethoxazole-Trimethoprim]   ?  Due to kidney disease   ? Epinephrine Other (See Comments)  ? ? ?Current Medications: ?Current Outpatient Medications  ?Medication Sig Dispense Refill  ? aspirin EC 81 MG tablet Take 1 tablet (81 mg total) by mouth daily. Swallow whole. 30 tablet 11  ? calcitRIOL (ROCALTROL) 0.25 MCG capsule Take 0.25 mcg by mouth every Monday, Wednesday, and Friday.    ? citalopram (CELEXA) 10 MG tablet Take 1 tablet (10 mg total) by mouth daily. 30 tablet 2  ? feeding supplement (ENSURE ENLIVE / ENSURE PLUS) LIQD Take 237 mLs by mouth 2 (two) times daily between meals. 237 mL 12  ? polyethylene glycol (MIRALAX / GLYCOLAX) 17 g packet Take 17 g by mouth daily as needed for mild constipation. 14 each 0  ?  ruxolitinib phosphate (JAKAFI) 5 MG tablet Take 1 tablet (5 mg total) by mouth daily. May start once daughter brings in the supply. 30 tablet 1  ? vitamin B-12 1000 MCG tablet Take 1 tablet (1,000 mcg total) by mouth daily.    ? acetaminophen (TYLENOL) 325 MG tablet Take 325-650 mg by mouth every 6 (six) hours as needed for mild pain or moderate pain. (Patient not taking: Reported on 06/18/2021)    ? ?No current facility-administered medications for this visit.  ? ? ?Review of Systems  ?Unable to perform ROS: Dementia  ? ?Performance status (ECOG):  2 ? ?Vitals ?Blood pressure 136/71, pulse 81, temperature (!) 97.3 ?F (36.3 ?C), temperature source Tympanic, weight 109 lb 9.6 oz (49.7 kg).  ? ?Physical Exam ?Vitals reviewed.  ?Constitutional:   ?   General: She is not in acute distress. ?   Appearance: She is well-developed.  ?   Comments: Frail appearance, patient sits in the wheelchair  ?HENT:  ?   Head: Normocephalic and atraumatic.  ?   Nose: Nose normal. No congestion.  ?   Mouth/Throat:  ?   Pharynx: No oropharyngeal exudate.  ?Eyes:  ?   General: No scleral icterus. ?   Conjunctiva/sclera: Conjunctivae normal.  ?Cardiovascular:  ?  Rate and Rhythm: Normal rate and regular rhythm.  ?   Pulses: Normal pulses.  ?   Heart sounds: No murmur heard. ?Pulmonary:  ?   Effort: Pulmonary effort is normal. No respiratory distress.  ?Abdominal:  ?   General: There is no distension.  ?   Tenderness: There is no abdominal tenderness.  ?Musculoskeletal:     ?   General: No deformity. Normal range of motion.  ?   Right lower leg: Edema present.  ?   Left lower leg: Edema present.  ?Skin: ?   General: Skin is warm and dry.  ?   Coloration: Skin is not pale.  ?   Findings: Bruising present.  ?Neurological:  ?   Mental Status: She is alert. Mental status is at baseline.  ?Psychiatric:     ?   Attention and Perception: Attention normal.     ?   Mood and Affect: Mood and affect normal.     ?   Behavior: Behavior is cooperative.      ?   Cognition and Memory: Cognition is impaired. Memory is impaired.  ? ? ?Appointment on 07/25/2021  ?Component Date Value Ref Range Status  ? LDH 07/25/2021 189  98 - 192 U/L Final  ? Performed at [facility]

## 2021-07-25 NOTE — Progress Notes (Signed)
? ?  ?Palliative Medicine ?[place] at [facility] ?Telephone:(336) [phone] Fax:(336) [phone] ? ? ?Name: Laura Wilcox ?Date: 07/25/2021 ?MRN: [id number]  ?DOB: Apr 11, 1926 ? ?Patient Care Team: ?Glean Hess, MD as PCP - General (Internal Medicine) ?Anthonette Legato, MD (Internal Medicine) ?Glean Hess, MD (Internal Medicine)  ? ? ?REASON FOR CONSULTATION: ?Laura Wilcox is a 88 y.o. female with multiple medical problems including memory impairment/probable dementia and polycythemia vera and secondary myelofibrosis on Jakafi.  Patient was referred to palliative care to help address goals in the context of worsening confusion/memory issues, anxiety, and agitation. ? ?SOCIAL HISTORY:    ? reports that she has never smoked. She has never used smokeless tobacco. She reports that she does not drink alcohol and does not use drugs. ? ?Patient is widowed.  She has a daughter who is her primary caregiver.  She has a son who lives in [place]. ? ?ADVANCE DIRECTIVES:  ?None on file ? ?CODE STATUS: DNR ? ?PAST MEDICAL HISTORY: ?Past Medical History:  ?Diagnosis Date  ? Acute metabolic encephalopathy 88/89/1694  ? Confusion 12/30/2018  ? Polycythemia   ? SCC (squamous cell carcinoma) 04/11/2021  ? R med dorsum hand - ED&C SCCIS  ? Squamous cell carcinoma of skin 09/06/2020  ? R thumb - ED&C  ? Urinary tract infection without hematuria 01/02/2020  ? Urticaria 06/02/2017  ? ? ?PAST SURGICAL HISTORY:  ?Past Surgical History:  ?Procedure Laterality Date  ? ABDOMINAL HYSTERECTOMY    ? complete  ? BREAST SURGERY    ? ? ?HEMATOLOGY/ONCOLOGY HISTORY:  ?Oncology History Overview Note  ?Laura Wilcox is a 88 y.o. female with polycythemia rubra vera and secondary myelofibrosis.  She presented in 04/2002 with thrombocytosis (835,000).  JAK2 V617F was positive on 05/20/2012. ?  ?Over the past year, hematocrit has ranged 37.5 - 42.4, hemoglobin 12.1 - 13.4, platelets 458,000 - 569,000, WBC  10,500 - 15,700.  Creatinine has been 1.0 - 1.2. ?  ?Bone marrow  on 04/15/2002 revealed a normocellular marrow for age with trilineage hematopoiesis and mild megakaryocytic hyperplasia with focal atypia.  There was no morphologic evidence of a myeloproliferative disorder.  Iron stains were inadequate for evaluation of storage iron.  There was no increase in reticulin fibers.  She has been on agrylin since 2012. ?  ?Bone marrow on 09/25/2012 revealed a persistent myeloproliferative disorder s/p Agrylin.  The current features were best classified as persistent myeloproliferative neoplasm (MPN) JAK2 V617F mutation positive.  Marrow was hypercellular for age (30-50%) with increased trilineage hematopoiesis and markedly increased atypical megakaryopoiesis with frequent clustering.  There were no increased blasts.  Iron stores were absent.  There was moderate to severe myelofibrosis (grade MF 2-3).  Differential included ET, PV, primary myelofibrosis, and MPN, unclassifiable.  Cytogenetics were normal (67, XX). ?  ?She developed pancytopenia in 12/2015.  Etiology is unclear (medication confusion or hydroxyurea + Agrylin). ?  ?She received Procrit 40,000 units (intermittently 09/2012 - 06/2016), Venofer (09/2012 - 10/2012), and Infed (12/2015 - 03/2016).  She received Neupogen during a period of neutropenia. She was anagrelide from 2004 - 08/13/2017.  She began [place] on 08/14/2017.  Jakafi was increased from 5 mg BID to 10 mg BID on 06/17/2018.  Jakafi was increased to 15 mg BID on 08/03/2018. ?  ?She began a phlebotomy program on 08/06/2017.  She undergoes small volume (150 cc with replacement of 150 cc NS) if her HCT > 42. ?  ?Ferritin has been followed: 529 on 09/03/2017,  1020 on 12/03/2017, 423 on 02/18/2018, and 858 on 11/17/2018.  Iron saturation was 37% and TIBC 270 on 11/17/2018.  B12 was 477 and folate 36 on 11/17/2018. ?  ?She was admitted to [facility] from 12/17/2018 - 12/18/2018.  She received 1 unit of PRBCs.  CBC  at discharge included a hematocrit of 29.0, hemoglobin 9.4, platelets 364,000, and WBC 10,200.  ?  ?Polycythemia rubra vera ([place])  ?06/02/2017 Initial Diagnosis  ? Polycythemia rubra vera ([place]) ?  ? ? ?ALLERGIES:  is allergic to bactrim [sulfamethoxazole-trimethoprim] and epinephrine. ? ?MEDICATIONS:  ?Current Outpatient Medications  ?Medication Sig Dispense Refill  ? acetaminophen (TYLENOL) 325 MG tablet Take 325-650 mg by mouth every 6 (six) hours as needed for mild pain or moderate pain. (Patient not taking: Reported on 06/18/2021)    ? calcitRIOL (ROCALTROL) 0.25 MCG capsule Take 0.25 mcg by mouth every Monday, Wednesday, and Friday.    ? citalopram (CELEXA) 10 MG tablet Take 1 tablet (10 mg total) by mouth daily. 30 tablet 2  ? feeding supplement (ENSURE ENLIVE / ENSURE PLUS) LIQD Take 237 mLs by mouth 2 (two) times daily between meals. 237 mL 12  ? polyethylene glycol (MIRALAX / GLYCOLAX) 17 g packet Take 17 g by mouth daily as needed for mild constipation. 14 each 0  ? ruxolitinib phosphate (JAKAFI) 10 MG tablet Take 1 tablet (10 mg total) by mouth 2 (two) times daily. 60 tablet 2  ? vitamin B-12 1000 MCG tablet Take 1 tablet (1,000 mcg total) by mouth daily.    ? ?No current facility-administered medications for this visit.  ? ? ?VITAL SIGNS: ?There were no vitals taken for this visit. ?There were no vitals filed for this visit. ?  ?Estimated body mass index is 17.17 kg/m? as calculated from the following: ?  Height as of 06/25/21: 5' 7"  (1.702 m). ?  Weight as of an earlier encounter on 07/25/21: 109 lb 9.6 oz (49.7 kg). ? ?LABS: ?CBC: ?   ?Component Value Date/Time  ? WBC 14.5 (H) 07/25/2021 1321  ? HGB 10.8 (L) 07/25/2021 1321  ? HGB 8.1 (L) 06/18/2021 1514  ? HCT 35.7 (L) 07/25/2021 1321  ? HCT 25.2 (L) 06/18/2021 1514  ? PLT 762 (H) 07/25/2021 1321  ? PLT 452 (H) 06/18/2021 1514  ? MCV 94.9 07/25/2021 1321  ? MCV 91 06/18/2021 1514  ? NEUTROABS 10.5 (H) 07/25/2021 1321  ? NEUTROABS 6.5 06/18/2021 1514  ?  LYMPHSABS 1.4 07/25/2021 1321  ? LYMPHSABS 1.0 06/18/2021 1514  ? MONOABS 1.1 (H) 07/25/2021 1321  ? EOSABS 0.5 07/25/2021 1321  ? EOSABS 0.4 06/18/2021 1514  ? BASOSABS 0.2 (H) 07/25/2021 1321  ? BASOSABS 0.3 (H) 06/18/2021 1514  ? ?Comprehensive Metabolic Panel: ?   ?Component Value Date/Time  ? NA 133 (L) 07/25/2021 1321  ? NA 134 06/18/2021 1514  ? K 4.2 07/25/2021 1321  ? CL 101 07/25/2021 1321  ? CO2 26 07/25/2021 1321  ? BUN 26 (H) 07/25/2021 1321  ? BUN 19 06/18/2021 1514  ? CREATININE 1.47 (H) 07/25/2021 1321  ? GLUCOSE 165 (H) 07/25/2021 1321  ? CALCIUM 9.0 07/25/2021 1321  ? AST 16 07/25/2021 1321  ? ALT 11 07/25/2021 1321  ? ALKPHOS 101 07/25/2021 1321  ? BILITOT 0.1 (L) 07/25/2021 1321  ? BILITOT 0.3 06/18/2021 1514  ? PROT 7.2 07/25/2021 1321  ? PROT 5.8 (L) 06/18/2021 1514  ? ALBUMIN 4.0 07/25/2021 1321  ? ALBUMIN 3.6 06/18/2021 1514  ? ? ?RADIOGRAPHIC STUDIES: ?No  results found. ? ?PERFORMANCE STATUS (ECOG) : 2 - Symptomatic, <50% confined to bed ? ?Review of Systems ?Unless otherwise noted, a complete review of systems is negative. ? ?Physical Exam ?General: NAD ?Pulmonary: Unlabored ?Extremities: no edema, no joint deformities ?Skin: no rashes ?Neurological: Weakness, baseline confusion ? ?IMPRESSION: ?I met with patient, daughter, and son. ? ?Since last seen, patient was hospitalized with UTI.  She is now in memory care at [facility].  Appreciate involvement by community palliative care. ? ?Family requested that I write a letter to patient's apartment landlord with recommendation for ALF. ? ?Symptomatically, patient appears to be doing reasonably well.  She does have occasional dry skin/pruritus but is not showering regularly.  Discussed importance of skin care/moisturizing lotion. ? ?Additionally, patient has bilateral pedal edema.  Negative Homans' sign.  Would recommend conservative measures. ? ?Discussed resources for Alzheimer's. ? ?PLAN: ?-Continue current scope of treatment ?-Continue  citalopram ?-RTC as needed ? ?Patient expressed understanding and was in agreement with this plan. She also understands that She can call the clinic at any time with any questions, concerns, or complaints.  ?

## 2021-07-26 ENCOUNTER — Ambulatory Visit: Payer: Medicare Other

## 2021-07-26 ENCOUNTER — Other Ambulatory Visit (HOSPITAL_COMMUNITY): Payer: Self-pay

## 2021-07-26 ENCOUNTER — Other Ambulatory Visit: Payer: Self-pay | Admitting: Pharmacist

## 2021-07-26 ENCOUNTER — Ambulatory Visit: Admission: RE | Admit: 2021-07-26 | Payer: Medicare Other | Source: Ambulatory Visit

## 2021-07-26 DIAGNOSIS — G459 Transient cerebral ischemic attack, unspecified: Secondary | ICD-10-CM | POA: Diagnosis not present

## 2021-07-26 DIAGNOSIS — R2681 Unsteadiness on feet: Secondary | ICD-10-CM | POA: Diagnosis not present

## 2021-07-26 DIAGNOSIS — N1832 Chronic kidney disease, stage 3b: Secondary | ICD-10-CM | POA: Diagnosis not present

## 2021-07-26 DIAGNOSIS — R262 Difficulty in walking, not elsewhere classified: Secondary | ICD-10-CM | POA: Diagnosis not present

## 2021-07-26 DIAGNOSIS — D45 Polycythemia vera: Secondary | ICD-10-CM

## 2021-07-26 DIAGNOSIS — M6281 Muscle weakness (generalized): Secondary | ICD-10-CM | POA: Diagnosis not present

## 2021-07-26 DIAGNOSIS — F02C0 Dementia in other diseases classified elsewhere, severe, without behavioral disturbance, psychotic disturbance, mood disturbance, and anxiety: Secondary | ICD-10-CM | POA: Diagnosis not present

## 2021-07-26 MED ORDER — RUXOLITINIB PHOSPHATE 5 MG PO TABS
5.0000 mg | ORAL_TABLET | Freq: Every day | ORAL | 1 refills | Status: DC
  Filled 2021-07-26: qty 23, 23d supply, fill #0
  Filled 2021-07-26: qty 7, 7d supply, fill #0
  Filled 2021-07-26: qty 30, 30d supply, fill #0
  Filled 2021-08-14: qty 30, 30d supply, fill #1

## 2021-07-27 ENCOUNTER — Other Ambulatory Visit (HOSPITAL_COMMUNITY): Payer: Self-pay

## 2021-07-27 DIAGNOSIS — R2681 Unsteadiness on feet: Secondary | ICD-10-CM | POA: Diagnosis not present

## 2021-07-27 DIAGNOSIS — N1832 Chronic kidney disease, stage 3b: Secondary | ICD-10-CM | POA: Diagnosis not present

## 2021-07-27 DIAGNOSIS — M6281 Muscle weakness (generalized): Secondary | ICD-10-CM | POA: Diagnosis not present

## 2021-07-27 DIAGNOSIS — R262 Difficulty in walking, not elsewhere classified: Secondary | ICD-10-CM | POA: Diagnosis not present

## 2021-07-27 DIAGNOSIS — G459 Transient cerebral ischemic attack, unspecified: Secondary | ICD-10-CM | POA: Diagnosis not present

## 2021-07-27 DIAGNOSIS — F02C0 Dementia in other diseases classified elsewhere, severe, without behavioral disturbance, psychotic disturbance, mood disturbance, and anxiety: Secondary | ICD-10-CM | POA: Diagnosis not present

## 2021-07-30 ENCOUNTER — Telehealth: Payer: Self-pay | Admitting: *Deleted

## 2021-07-30 DIAGNOSIS — R262 Difficulty in walking, not elsewhere classified: Secondary | ICD-10-CM | POA: Diagnosis not present

## 2021-07-30 DIAGNOSIS — F02C0 Dementia in other diseases classified elsewhere, severe, without behavioral disturbance, psychotic disturbance, mood disturbance, and anxiety: Secondary | ICD-10-CM | POA: Diagnosis not present

## 2021-07-30 DIAGNOSIS — R278 Other lack of coordination: Secondary | ICD-10-CM | POA: Diagnosis not present

## 2021-07-30 DIAGNOSIS — G459 Transient cerebral ischemic attack, unspecified: Secondary | ICD-10-CM | POA: Diagnosis not present

## 2021-07-30 DIAGNOSIS — M6281 Muscle weakness (generalized): Secondary | ICD-10-CM | POA: Diagnosis not present

## 2021-07-30 DIAGNOSIS — N1832 Chronic kidney disease, stage 3b: Secondary | ICD-10-CM | POA: Diagnosis not present

## 2021-07-30 DIAGNOSIS — R2681 Unsteadiness on feet: Secondary | ICD-10-CM | POA: Diagnosis not present

## 2021-07-30 DIAGNOSIS — R2689 Other abnormalities of gait and mobility: Secondary | ICD-10-CM | POA: Diagnosis not present

## 2021-07-30 NOTE — Telephone Encounter (Signed)
Daughter called reporting that new prescription for Jakafi came in, bit the [facility] (Nursing facility needs new order for the dose change of this medicine. Please fax order to [phone] ASAP ?

## 2021-07-30 NOTE — Telephone Encounter (Signed)
Faxed new [facility] to [facility] advising patient to take '5mg'$ / daily of Jakafi ?

## 2021-07-31 ENCOUNTER — Non-Acute Institutional Stay: Payer: Medicare Other | Admitting: Student

## 2021-07-31 DIAGNOSIS — F02C0 Dementia in other diseases classified elsewhere, severe, without behavioral disturbance, psychotic disturbance, mood disturbance, and anxiety: Secondary | ICD-10-CM | POA: Diagnosis not present

## 2021-07-31 DIAGNOSIS — R531 Weakness: Secondary | ICD-10-CM

## 2021-07-31 DIAGNOSIS — R262 Difficulty in walking, not elsewhere classified: Secondary | ICD-10-CM | POA: Diagnosis not present

## 2021-07-31 DIAGNOSIS — R2681 Unsteadiness on feet: Secondary | ICD-10-CM | POA: Diagnosis not present

## 2021-07-31 DIAGNOSIS — Z515 Encounter for palliative care: Secondary | ICD-10-CM

## 2021-07-31 DIAGNOSIS — G459 Transient cerebral ischemic attack, unspecified: Secondary | ICD-10-CM | POA: Diagnosis not present

## 2021-07-31 DIAGNOSIS — N1832 Chronic kidney disease, stage 3b: Secondary | ICD-10-CM | POA: Diagnosis not present

## 2021-07-31 DIAGNOSIS — D45 Polycythemia vera: Secondary | ICD-10-CM

## 2021-07-31 DIAGNOSIS — F0393 Unspecified dementia, unspecified severity, with mood disturbance: Secondary | ICD-10-CM | POA: Diagnosis not present

## 2021-07-31 DIAGNOSIS — M6281 Muscle weakness (generalized): Secondary | ICD-10-CM | POA: Diagnosis not present

## 2021-07-31 NOTE — Progress Notes (Signed)
? ? ?Manufacturing engineer ?Community Palliative Care Consult Note ?Telephone: [phone]  ?Fax: [phone]  ? ? ?Date of encounter: 07/31/21 ? ?PATIENT NAME: Laura Wilcox ?[address] ?[place] [address]   ?[phone] (home)  ?DOB: 10/06/1926 ?MRN: [id number] ?PRIMARY CARE PROVIDER:    ?Glean Hess, MD,  ?[address] ?[place] [address] ?[phone] ? ?REFERRING PROVIDER:   ?Glean Hess, MD ?[address] ?[address] ?[place],  [place] [address] ?[phone] ? ?RESPONSIBLE PARTY:    ?Contact Information   ? ? Name Relation Home Work Mobile  ? watts,tricia Daughter   [phone]  ? SIOBHAN, ZARO Son   [phone]  ? watts,sherwood Relative   [phone]  ? ?  ? ? ? ?I met face to face with patient in the facility. Palliative Care was asked to follow this patient by consultation request of  Glean Hess, MD to address advance care planning and complex medical decision making. This is a follow up visit. ? ?                                 ASSESSMENT AND PLAN / RECOMMENDATIONS:  ? ?Advance Care Planning/Goals of Care: Goals include to maximize quality of life and symptom management. Patient/health care surrogate gave his/her permission to discuss. ? ?CODE STATUS: DNR ? ?Symptom Management/Plan: ? ?Dementia-patient recently transitioned to AL on memory unit. She is adjusting well per staff; will occasionally ask to go home, but is able to redirected. Mood has been stable; continue citalopram as directed. Reorient and redirect as needed. Monitor for falls/safety.  ? ?Polycythema vera-patient's Jakafi was restarted at 5 mg daily. Follow up with oncology as scheduled.  ? ?Generalized weakness-continue therapy as directed. Encourage walker for ambulation. ? ?Follow up Palliative Care Visit: Palliative care will continue to follow for complex medical decision making, advance care planning, and clarification of goals. Return in 6-8 weeks or prn. ? ? ?This visit was coded based  on medical decision making (MDM). ? ?PPS: 50% ? ?HOSPICE ELIGIBILITY/DIAGNOSIS: TBD ? ?Chief Complaint: Palliative Medicine follow up visit.  ? ?HISTORY OF PRESENT ILLNESS:  Laura Wilcox is a 88 y.o. year old female  with dementia, protein calorie malnutrition, weakness, depression, CKD 3b, polycythemia vera, anemia of chronic disease, skin cancer. Patient recently hospitalized 3/27-07/02/21 due to UTI, weakness. ? ?Patient resides at [facility] on dementia unit. Staff report patient is adjusting well to her move  She does occasionally ask to go home but is easily redirected.  She is currently working with occupational and physical therapy.  Patient denies having any pain, shortness of breath, nausea or constipation.  Staff reports patient likes to stay in her room and will occasionally come out to common area.  She is sleeping well at night; she naps intermittently throughout the day.  Her appetite has been good per staff.  Patient is able to answer direct questions.  HPI and ROS primarily obtained from staff. ? ?History obtained from review of EMR, discussion with primary team, and interview with family, facility staff/caregiver and/or Ms. Laura Wilcox.  ?I reviewed available labs, medications, imaging, studies and related documents from the EMR.  Records reviewed and summarized above.  ? ? ?Physical Exam: ?Pulse 84, resp 18, b/p 108/54, sats 98% on room air ?Constitutional: NAD ?General: frail appearing, thin ?EYES: anicteric sclera, lids intact, no discharge  ?ENMT: intact hearing, oral mucous membranes moist, dentition intact ?CV:  S1S2, RRR, no LE edema ?Pulmonary: LCTA, no increased work of breathing, no cough, room air ?Abdomen: normo-active BS + 4 quadrants, soft and non tender, no ascites ?GU: deferred ?MSK: moves all extremities, ambulatory with walker ?Skin: warm and dry, lesion to left cheek ?Neuro: +generalized weakness, A & O to person ?Psych: non-anxious affect, pleasant,  cooperative ?Hem/lymph/immuno: no widespread bruising ? ? ?Thank you for the opportunity to participate in the care of Ms. Laura Wilcox.  The palliative care team will continue to follow. Please call our office at [phone] if we can be of additional assistance.  ? ?Ezekiel Slocumb, NP  ? ?COVID-19 PATIENT SCREENING TOOL ?Asked and negative response unless otherwise noted:  ? ?Have you had symptoms of covid, tested positive or been in contact with someone with symptoms/positive test in the past 5-10 days? No ? ?

## 2021-08-01 ENCOUNTER — Other Ambulatory Visit (HOSPITAL_COMMUNITY): Payer: Self-pay

## 2021-08-01 DIAGNOSIS — F02C0 Dementia in other diseases classified elsewhere, severe, without behavioral disturbance, psychotic disturbance, mood disturbance, and anxiety: Secondary | ICD-10-CM | POA: Diagnosis not present

## 2021-08-01 DIAGNOSIS — N1832 Chronic kidney disease, stage 3b: Secondary | ICD-10-CM | POA: Diagnosis not present

## 2021-08-01 DIAGNOSIS — G459 Transient cerebral ischemic attack, unspecified: Secondary | ICD-10-CM | POA: Diagnosis not present

## 2021-08-01 DIAGNOSIS — R2681 Unsteadiness on feet: Secondary | ICD-10-CM | POA: Diagnosis not present

## 2021-08-01 DIAGNOSIS — M6281 Muscle weakness (generalized): Secondary | ICD-10-CM | POA: Diagnosis not present

## 2021-08-01 DIAGNOSIS — R262 Difficulty in walking, not elsewhere classified: Secondary | ICD-10-CM | POA: Diagnosis not present

## 2021-08-02 DIAGNOSIS — R2681 Unsteadiness on feet: Secondary | ICD-10-CM | POA: Diagnosis not present

## 2021-08-02 DIAGNOSIS — G459 Transient cerebral ischemic attack, unspecified: Secondary | ICD-10-CM | POA: Diagnosis not present

## 2021-08-02 DIAGNOSIS — F02C0 Dementia in other diseases classified elsewhere, severe, without behavioral disturbance, psychotic disturbance, mood disturbance, and anxiety: Secondary | ICD-10-CM | POA: Diagnosis not present

## 2021-08-02 DIAGNOSIS — R262 Difficulty in walking, not elsewhere classified: Secondary | ICD-10-CM | POA: Diagnosis not present

## 2021-08-02 DIAGNOSIS — N1832 Chronic kidney disease, stage 3b: Secondary | ICD-10-CM | POA: Diagnosis not present

## 2021-08-02 DIAGNOSIS — M6281 Muscle weakness (generalized): Secondary | ICD-10-CM | POA: Diagnosis not present

## 2021-08-06 DIAGNOSIS — G459 Transient cerebral ischemic attack, unspecified: Secondary | ICD-10-CM | POA: Diagnosis not present

## 2021-08-06 DIAGNOSIS — R2681 Unsteadiness on feet: Secondary | ICD-10-CM | POA: Diagnosis not present

## 2021-08-06 DIAGNOSIS — M6281 Muscle weakness (generalized): Secondary | ICD-10-CM | POA: Diagnosis not present

## 2021-08-06 DIAGNOSIS — R262 Difficulty in walking, not elsewhere classified: Secondary | ICD-10-CM | POA: Diagnosis not present

## 2021-08-06 DIAGNOSIS — F02C0 Dementia in other diseases classified elsewhere, severe, without behavioral disturbance, psychotic disturbance, mood disturbance, and anxiety: Secondary | ICD-10-CM | POA: Diagnosis not present

## 2021-08-06 DIAGNOSIS — N1832 Chronic kidney disease, stage 3b: Secondary | ICD-10-CM | POA: Diagnosis not present

## 2021-08-07 DIAGNOSIS — G459 Transient cerebral ischemic attack, unspecified: Secondary | ICD-10-CM | POA: Diagnosis not present

## 2021-08-07 DIAGNOSIS — R2681 Unsteadiness on feet: Secondary | ICD-10-CM | POA: Diagnosis not present

## 2021-08-07 DIAGNOSIS — M6281 Muscle weakness (generalized): Secondary | ICD-10-CM | POA: Diagnosis not present

## 2021-08-07 DIAGNOSIS — F02C0 Dementia in other diseases classified elsewhere, severe, without behavioral disturbance, psychotic disturbance, mood disturbance, and anxiety: Secondary | ICD-10-CM | POA: Diagnosis not present

## 2021-08-07 DIAGNOSIS — R262 Difficulty in walking, not elsewhere classified: Secondary | ICD-10-CM | POA: Diagnosis not present

## 2021-08-07 DIAGNOSIS — N1832 Chronic kidney disease, stage 3b: Secondary | ICD-10-CM | POA: Diagnosis not present

## 2021-08-08 ENCOUNTER — Other Ambulatory Visit (HOSPITAL_COMMUNITY): Payer: Self-pay

## 2021-08-08 DIAGNOSIS — G459 Transient cerebral ischemic attack, unspecified: Secondary | ICD-10-CM | POA: Diagnosis not present

## 2021-08-08 DIAGNOSIS — F02C0 Dementia in other diseases classified elsewhere, severe, without behavioral disturbance, psychotic disturbance, mood disturbance, and anxiety: Secondary | ICD-10-CM | POA: Diagnosis not present

## 2021-08-08 DIAGNOSIS — R2681 Unsteadiness on feet: Secondary | ICD-10-CM | POA: Diagnosis not present

## 2021-08-08 DIAGNOSIS — R262 Difficulty in walking, not elsewhere classified: Secondary | ICD-10-CM | POA: Diagnosis not present

## 2021-08-08 DIAGNOSIS — M6281 Muscle weakness (generalized): Secondary | ICD-10-CM | POA: Diagnosis not present

## 2021-08-08 DIAGNOSIS — N1832 Chronic kidney disease, stage 3b: Secondary | ICD-10-CM | POA: Diagnosis not present

## 2021-08-09 DIAGNOSIS — F02C0 Dementia in other diseases classified elsewhere, severe, without behavioral disturbance, psychotic disturbance, mood disturbance, and anxiety: Secondary | ICD-10-CM | POA: Diagnosis not present

## 2021-08-09 DIAGNOSIS — R262 Difficulty in walking, not elsewhere classified: Secondary | ICD-10-CM | POA: Diagnosis not present

## 2021-08-09 DIAGNOSIS — N1832 Chronic kidney disease, stage 3b: Secondary | ICD-10-CM | POA: Diagnosis not present

## 2021-08-09 DIAGNOSIS — R2681 Unsteadiness on feet: Secondary | ICD-10-CM | POA: Diagnosis not present

## 2021-08-09 DIAGNOSIS — M6281 Muscle weakness (generalized): Secondary | ICD-10-CM | POA: Diagnosis not present

## 2021-08-09 DIAGNOSIS — G459 Transient cerebral ischemic attack, unspecified: Secondary | ICD-10-CM | POA: Diagnosis not present

## 2021-08-13 ENCOUNTER — Ambulatory Visit (INDEPENDENT_AMBULATORY_CARE_PROVIDER_SITE_OTHER): Payer: Medicare Other | Admitting: Dermatology

## 2021-08-13 DIAGNOSIS — C44329 Squamous cell carcinoma of skin of other parts of face: Secondary | ICD-10-CM

## 2021-08-13 DIAGNOSIS — C4492 Squamous cell carcinoma of skin, unspecified: Secondary | ICD-10-CM

## 2021-08-13 DIAGNOSIS — L820 Inflamed seborrheic keratosis: Secondary | ICD-10-CM

## 2021-08-13 NOTE — Progress Notes (Signed)
? ?  Follow-Up Visit ?  ?Subjective  ?[name] is a 88 y.o. female who presents for the following: check spot (R popliteal, noticed a few weeks ago, pain with touch).  She is not able to tolerate local anesthetic injections or cutting.  Mohs surgeon could not remove lesion on face.  Lesion has continued to grow on face.  Pt has dementia. ? ?Patient accompanied by daughter who contributes to history. ? ?The following portions of the chart were reviewed this encounter and updated as appropriate:  ?  ?  ? ?Review of Systems:  No other skin or systemic complaints except as noted in HPI or Assessment and Plan. ? ?Objective  ?Well appearing patient in no apparent distress; mood and affect are within normal limits. ? ?A focused examination was performed including right leg. Relevant physical exam findings are noted in the Assessment and Plan. ? ?Right Popliteal Fossa x 1 ?Stuck on waxy paps with erythema ? ? ? ? ?L mid cheek ?3.0cm keratotic nodule with central plug ? ? ? ? ?L lat eye ?9.50m keratotic pap with central plug ? ? ? ?Assessment & Plan  ?Inflamed seborrheic keratosis ?Right Popliteal Fossa x 1 ? ?Destruction of lesion - Right Popliteal Fossa x 1 ? ?Destruction method: cryotherapy   ?Informed consent: discussed and consent obtained   ?Lesion destroyed using liquid nitrogen: Yes   ?Region frozen until ice ball extended beyond lesion: Yes   ?Outcome: patient tolerated procedure well with no complications   ?Post-procedure details: wound care instructions given   ?Additional details:  Prior to procedure, discussed risks of blister formation, small wound, skin dyspigmentation, or rare scar following cryotherapy. Recommend Vaseline ointment to treated areas while healing.  ? ?SCC (squamous cell carcinoma) (2) ?L mid cheek ? ?L lat eye ? ?Due to patient's adverse reaction to either lidocaine or epinephrine we have avoided anesthesia and biopsy and treatment that involves anesthesia.  Recommend radiation treatments,  [facility] ? ?Related Procedures ?Ambulatory referral to Radiation Oncology ? ? ?Return in about 8 weeks (around 10/08/2021) for recheck ISK. ? ?I, Sonya Hupman, RMA, am acting as scribe for TBrendolyn Patty MD . ? ?Documentation: I have reviewed the above documentation for accuracy and completeness, and I agree with the above. ? ?TBrendolyn PattyMD  ? ?

## 2021-08-13 NOTE — Patient Instructions (Addendum)

## 2021-08-14 ENCOUNTER — Other Ambulatory Visit (HOSPITAL_COMMUNITY): Payer: Self-pay

## 2021-08-14 DIAGNOSIS — N1832 Chronic kidney disease, stage 3b: Secondary | ICD-10-CM | POA: Diagnosis not present

## 2021-08-14 DIAGNOSIS — R2681 Unsteadiness on feet: Secondary | ICD-10-CM | POA: Diagnosis not present

## 2021-08-14 DIAGNOSIS — G459 Transient cerebral ischemic attack, unspecified: Secondary | ICD-10-CM | POA: Diagnosis not present

## 2021-08-14 DIAGNOSIS — F02C0 Dementia in other diseases classified elsewhere, severe, without behavioral disturbance, psychotic disturbance, mood disturbance, and anxiety: Secondary | ICD-10-CM | POA: Diagnosis not present

## 2021-08-14 DIAGNOSIS — M6281 Muscle weakness (generalized): Secondary | ICD-10-CM | POA: Diagnosis not present

## 2021-08-14 DIAGNOSIS — R262 Difficulty in walking, not elsewhere classified: Secondary | ICD-10-CM | POA: Diagnosis not present

## 2021-08-15 ENCOUNTER — Telehealth: Payer: Self-pay | Admitting: *Deleted

## 2021-08-15 DIAGNOSIS — R2681 Unsteadiness on feet: Secondary | ICD-10-CM | POA: Diagnosis not present

## 2021-08-15 DIAGNOSIS — N1832 Chronic kidney disease, stage 3b: Secondary | ICD-10-CM | POA: Diagnosis not present

## 2021-08-15 DIAGNOSIS — G459 Transient cerebral ischemic attack, unspecified: Secondary | ICD-10-CM | POA: Diagnosis not present

## 2021-08-15 DIAGNOSIS — M6281 Muscle weakness (generalized): Secondary | ICD-10-CM | POA: Diagnosis not present

## 2021-08-15 DIAGNOSIS — F02C0 Dementia in other diseases classified elsewhere, severe, without behavioral disturbance, psychotic disturbance, mood disturbance, and anxiety: Secondary | ICD-10-CM | POA: Diagnosis not present

## 2021-08-15 DIAGNOSIS — R262 Difficulty in walking, not elsewhere classified: Secondary | ICD-10-CM | POA: Diagnosis not present

## 2021-08-15 NOTE — Telephone Encounter (Signed)
Patient daughter called asking to reschedule patient appointment  ?

## 2021-08-16 ENCOUNTER — Ambulatory Visit (INDEPENDENT_AMBULATORY_CARE_PROVIDER_SITE_OTHER): Payer: Medicare Other | Admitting: Dermatology

## 2021-08-16 ENCOUNTER — Telehealth: Payer: Self-pay | Admitting: Oncology

## 2021-08-16 ENCOUNTER — Encounter: Payer: Self-pay | Admitting: Dermatology

## 2021-08-16 DIAGNOSIS — L03115 Cellulitis of right lower limb: Secondary | ICD-10-CM | POA: Diagnosis not present

## 2021-08-16 DIAGNOSIS — L089 Local infection of the skin and subcutaneous tissue, unspecified: Secondary | ICD-10-CM

## 2021-08-16 DIAGNOSIS — M6281 Muscle weakness (generalized): Secondary | ICD-10-CM | POA: Diagnosis not present

## 2021-08-16 DIAGNOSIS — F02C0 Dementia in other diseases classified elsewhere, severe, without behavioral disturbance, psychotic disturbance, mood disturbance, and anxiety: Secondary | ICD-10-CM | POA: Diagnosis not present

## 2021-08-16 DIAGNOSIS — R262 Difficulty in walking, not elsewhere classified: Secondary | ICD-10-CM | POA: Diagnosis not present

## 2021-08-16 DIAGNOSIS — L98491 Non-pressure chronic ulcer of skin of other sites limited to breakdown of skin: Secondary | ICD-10-CM

## 2021-08-16 DIAGNOSIS — G459 Transient cerebral ischemic attack, unspecified: Secondary | ICD-10-CM | POA: Diagnosis not present

## 2021-08-16 DIAGNOSIS — L0889 Other specified local infections of the skin and subcutaneous tissue: Secondary | ICD-10-CM | POA: Diagnosis not present

## 2021-08-16 DIAGNOSIS — R2681 Unsteadiness on feet: Secondary | ICD-10-CM | POA: Diagnosis not present

## 2021-08-16 DIAGNOSIS — N1832 Chronic kidney disease, stage 3b: Secondary | ICD-10-CM | POA: Diagnosis not present

## 2021-08-16 MED ORDER — MUPIROCIN 2 % EX OINT: TOPICAL_OINTMENT | CUTANEOUS | 1 refills | Status: DC

## 2021-08-16 MED ORDER — CIPROFLOXACIN HCL 250 MG PO TABS: 250.0000 mg | ORAL_TABLET | Freq: Every day | ORAL | 0 refills | Status: AC

## 2021-08-16 NOTE — Patient Instructions (Addendum)
Physician orders for this visit:  Start Cipro 250 mg 1 po qam with breakfast for 7 days.  Once daily wash area with soap and water. Apply ointment and cover with bandage Start Mupirocin ointment apply every other day alternating with Neosporin. Keep covered. Repeat for 3 weeks   Sarina Ser, MD    If You Need Anything After Your Visit  If you have any questions or concerns for your doctor, please call our main line at [phone] and press option 4 to reach your doctor's medical assistant. If no one answers, please leave a voicemail as directed and we will return your call as soon as possible. Messages left after 4 pm will be answered the following business day.   You may also send [place] a message via [place]. We typically respond to MyChart messages within 1-2 business days.  For prescription refills, please ask your pharmacy to contact our office. Our fax number is [phone].  If you have an urgent issue when the clinic is closed that cannot wait until the next business day, you can page your doctor at the number below.    Please note that while we do our best to be available for urgent issues outside of office hours, we are not available 24/7.   If you have an urgent issue and are unable to reach [place], you may choose to seek medical care at your doctor's office, retail clinic, urgent care center, or emergency room.  If you have a medical emergency, please immediately call 911 or go to the emergency department.  Pager Numbers  - Dr. Nehemiah Massed: [phone]  - Dr. Laurence Ferrari: [phone]  - Dr. Nicole Kindred: [phone]  In the event of inclement weather, please call our main line at [phone] for an update on the status of any delays or closures.  Dermatology Medication Tips: Please keep the boxes that topical medications come in in order to help keep track of the instructions about where and how to use these. Pharmacies typically print the medication instructions only on the  boxes and not directly on the medication tubes.   If your medication is too expensive, please contact our office at [phone] option 4 or send [place] a message through [place].   We are unable to tell what your co-pay for medications will be in advance as this is different depending on your insurance coverage. However, we may be able to find a substitute medication at lower cost or fill out paperwork to get insurance to cover a needed medication.   If a prior authorization is required to get your medication covered by your insurance company, please allow [place] 1-2 business days to complete this process.  Drug prices often vary depending on where the prescription is filled and some pharmacies may offer cheaper prices.  The website www.goodrx.com contains coupons for medications through different pharmacies. The prices here do not account for what the cost may be with help from insurance (it may be cheaper with your insurance), but the website can give you the price if you did not use any insurance.  - You can print the associated coupon and take it with your prescription to the pharmacy.  - You may also stop by our office during regular business hours and pick up a GoodRx coupon card.  - If you need your prescription sent electronically to a different pharmacy, notify our office through [facility] or by phone at [phone] option 4.     Si Usted Necesita Algo Despus de Su  Visita  Tambin puede enviarnos un mensaje a travs de MyChart. Por lo general respondemos a los mensajes de MyChart en el transcurso de 1 a 2 das hbiles.  Para renovar recetas, por favor pida a su farmacia que se ponga en contacto con nuestra oficina. Harland Dingwall de fax es [place] [phone].  Si tiene un asunto urgente cuando la clnica est cerrada y que no puede esperar hasta el siguiente da hbil, puede llamar/localizar a su doctor(a) al nmero que aparece a continuacin.   Por favor, tenga en cuenta que  aunque hacemos todo lo posible para estar disponibles para asuntos urgentes fuera del horario de [place], no estamos disponibles las 24 horas del da, los 7 das de la [place].   Si tiene un problema urgente y no puede comunicarse con nosotros, puede optar por buscar atencin mdica  en el consultorio de su doctor(a), en una clnica privada, en un centro de atencin urgente o en una sala de emergencias.  Si tiene Engineering geologist, por favor llame inmediatamente al 911 o vaya a la sala de emergencias.  Nmeros de bper  - Dr. Nehemiah Massed: [phone]  - Dra. Moye: [phone]  - Dra. Nicole Kindred: [phone]  En caso de inclemencias del [place], por favor llame a Johnsie Kindred principal al [phone] para una actualizacin sobre el [place] de cualquier retraso o cierre.  Consejos para la medicacin en dermatologa: Por favor, guarde las cajas en las que vienen los medicamentos de uso tpico para ayudarle a seguir las instrucciones sobre dnde y cmo usarlos. Las farmacias generalmente imprimen las instrucciones del medicamento slo en las cajas y no directamente en los tubos del [place].   Si su medicamento es muy caro, por favor, pngase en contacto con Zigmund Daniel llamando al [phone] y presione la opcin 4 o envenos un mensaje a travs de Pharmacist, community.   No podemos decirle cul ser su copago por los medicamentos por adelantado ya que esto es diferente dependiendo de la cobertura de su seguro. Sin embargo, es posible que podamos encontrar un medicamento sustituto a Electrical engineer un formulario para que el seguro cubra el medicamento que se considera necesario.   Si se requiere una autorizacin previa para que su compaa de seguros [place] su medicamento, por favor permtanos de 1 a 2 das hbiles para completar este proceso.  Los precios de los medicamentos varan con frecuencia dependiendo del Environmental consultant de dnde se surte la receta y alguna farmacias pueden ofrecer precios ms  baratos.  El sitio web www.goodrx.com tiene cupones para medicamentos de Airline pilot. Los precios aqu no tienen en cuenta lo que podra costar con la ayuda del seguro (puede ser ms barato con su seguro), pero el sitio web puede darle el precio si no utiliz Research scientist (physical sciences).  - Puede imprimir el cupn correspondiente y llevarlo con su receta a la farmacia.  - Tambin puede pasar por nuestra oficina durante el horario de atencin regular y Charity fundraiser una tarjeta de cupones de GoodRx.  - Si necesita que su receta se enve electrnicamente a una farmacia diferente, informe a nuestra oficina a travs de MyChart de [place] o por telfono llamando al [phone] y presione la opcin 4.

## 2021-08-16 NOTE — Progress Notes (Signed)
   Follow-Up Visit   Subjective  [name] is a 88 y.o. female who presents for the following: Wound Check (ISK was treated with LN2 Monday, 08/13/2021, by Dr. Nicole Kindred. Patient's daughter has noticed a foul odor. Thinks may be infected.). Patient accompanied by daughter who contributes to history.  The following portions of the chart were reviewed this encounter and updated as appropriate:  Tobacco  Allergies  Meds  Problems  Med Hx  Surg Hx  Fam Hx     Review of Systems: No other skin or systemic complaints except as noted in HPI or Assessment and Plan.  Objective  Well appearing patient in no apparent distress; mood and affect are within normal limits.  A focused examination was performed including right leg. Relevant physical exam findings are noted in the Assessment and Plan.  Right Popliteal Fossa      Assessment & Plan  Wound infection and ulcer (after LN2 tx) with Cellulitis of right lower extremity Right Popliteal Fossa  Wound cleansed with Puracyn spray and debrided.  Mupirocin and gauze applied. Gauze wrap and coban applied.  Start Cipro 250 mg 1 po qam with breakfast for 7 days. Start Mupirocin ointment apply every other day alternating with Neosporin.  Faxed orders to [facility] for wound care. [phone]  ciprofloxacin (CIPRO) 250 MG tablet - Right Popliteal Fossa Take 1 tablet (250 mg total) by mouth daily with breakfast for 7 days. mupirocin ointment (BACTROBAN) 2 % - Right Popliteal Fossa Apply to wound every other day  Return for Follow Up As Scheduled.  I, Laura Wilcox, CMA, am acting as scribe for Laura Ser, MD. Documentation: I have reviewed the above documentation for accuracy and completeness, and I agree with the above.  Laura Ser, MD

## 2021-08-16 NOTE — Telephone Encounter (Signed)
pt daughter called in to r/s appt, she will be out of town and [place] to bring pt in...KJ

## 2021-08-20 ENCOUNTER — Inpatient Hospital Stay: Payer: Medicare Other | Attending: Oncology

## 2021-08-20 ENCOUNTER — Telehealth: Payer: Self-pay

## 2021-08-20 ENCOUNTER — Institutional Professional Consult (permissible substitution): Payer: PRIVATE HEALTH INSURANCE | Admitting: Radiation Oncology

## 2021-08-20 ENCOUNTER — Other Ambulatory Visit (HOSPITAL_COMMUNITY): Payer: Self-pay

## 2021-08-20 ENCOUNTER — Inpatient Hospital Stay: Payer: Medicare Other

## 2021-08-20 ENCOUNTER — Encounter: Payer: Self-pay | Admitting: Oncology

## 2021-08-20 ENCOUNTER — Inpatient Hospital Stay (HOSPITAL_BASED_OUTPATIENT_CLINIC_OR_DEPARTMENT_OTHER): Payer: Medicare Other | Admitting: Oncology

## 2021-08-20 VITALS — BP 131/64 | HR 79 | Temp 96.6°F | Wt 112.0 lb

## 2021-08-20 DIAGNOSIS — D45 Polycythemia vera: Secondary | ICD-10-CM | POA: Insufficient documentation

## 2021-08-20 DIAGNOSIS — Z79899 Other long term (current) drug therapy: Secondary | ICD-10-CM | POA: Diagnosis not present

## 2021-08-20 DIAGNOSIS — R2681 Unsteadiness on feet: Secondary | ICD-10-CM | POA: Diagnosis not present

## 2021-08-20 DIAGNOSIS — M25471 Effusion, right ankle: Secondary | ICD-10-CM | POA: Diagnosis not present

## 2021-08-20 DIAGNOSIS — D7581 Myelofibrosis: Secondary | ICD-10-CM

## 2021-08-20 DIAGNOSIS — Z803 Family history of malignant neoplasm of breast: Secondary | ICD-10-CM | POA: Insufficient documentation

## 2021-08-20 DIAGNOSIS — Z818 Family history of other mental and behavioral disorders: Secondary | ICD-10-CM | POA: Insufficient documentation

## 2021-08-20 DIAGNOSIS — Z8249 Family history of ischemic heart disease and other diseases of the circulatory system: Secondary | ICD-10-CM | POA: Insufficient documentation

## 2021-08-20 DIAGNOSIS — R600 Localized edema: Secondary | ICD-10-CM | POA: Insufficient documentation

## 2021-08-20 DIAGNOSIS — M7989 Other specified soft tissue disorders: Secondary | ICD-10-CM | POA: Insufficient documentation

## 2021-08-20 DIAGNOSIS — R4189 Other symptoms and signs involving cognitive functions and awareness: Secondary | ICD-10-CM | POA: Insufficient documentation

## 2021-08-20 DIAGNOSIS — Z881 Allergy status to other antibiotic agents status: Secondary | ICD-10-CM | POA: Diagnosis not present

## 2021-08-20 DIAGNOSIS — Z85828 Personal history of other malignant neoplasm of skin: Secondary | ICD-10-CM | POA: Diagnosis not present

## 2021-08-20 DIAGNOSIS — M6281 Muscle weakness (generalized): Secondary | ICD-10-CM | POA: Diagnosis not present

## 2021-08-20 DIAGNOSIS — F02C0 Dementia in other diseases classified elsewhere, severe, without behavioral disturbance, psychotic disturbance, mood disturbance, and anxiety: Secondary | ICD-10-CM | POA: Diagnosis not present

## 2021-08-20 DIAGNOSIS — N1832 Chronic kidney disease, stage 3b: Secondary | ICD-10-CM

## 2021-08-20 DIAGNOSIS — Z87440 Personal history of urinary (tract) infections: Secondary | ICD-10-CM | POA: Insufficient documentation

## 2021-08-20 DIAGNOSIS — F0390 Unspecified dementia without behavioral disturbance: Secondary | ICD-10-CM | POA: Diagnosis not present

## 2021-08-20 DIAGNOSIS — D471 Chronic myeloproliferative disease: Secondary | ICD-10-CM | POA: Insufficient documentation

## 2021-08-20 DIAGNOSIS — G459 Transient cerebral ischemic attack, unspecified: Secondary | ICD-10-CM | POA: Diagnosis not present

## 2021-08-20 DIAGNOSIS — D631 Anemia in chronic kidney disease: Secondary | ICD-10-CM | POA: Insufficient documentation

## 2021-08-20 DIAGNOSIS — R262 Difficulty in walking, not elsewhere classified: Secondary | ICD-10-CM | POA: Diagnosis not present

## 2021-08-20 LAB — COMPREHENSIVE METABOLIC PANEL
ALT: 12 U/L (ref 0–44)
AST: 20 U/L (ref 15–41)
Albumin: 3.8 g/dL (ref 3.5–5.0)
Alkaline Phosphatase: 96 U/L (ref 38–126)
Anion gap: 8 (ref 5–15)
BUN: 23 mg/dL (ref 8–23)
CO2: 27 mmol/L (ref 22–32)
Calcium: 8.6 mg/dL — ABNORMAL LOW (ref 8.9–10.3)
Chloride: 100 mmol/L (ref 98–111)
Creatinine, Ser: 1.13 mg/dL — ABNORMAL HIGH (ref 0.44–1.00)
GFR, Estimated: 46 mL/min — ABNORMAL LOW (ref >60)
Glucose, Bld: 166 mg/dL — ABNORMAL HIGH (ref 70–99)
Potassium: 4.4 mmol/L (ref 3.5–5.1)
Sodium: 135 mmol/L (ref 135–145)
Total Bilirubin: 0.5 mg/dL (ref 0.3–1.2)
Total Protein: 7 g/dL (ref 6.5–8.1)

## 2021-08-20 LAB — CBC WITH DIFFERENTIAL/PLATELET
Abs Immature Granulocytes: 0.64 10*3/uL — ABNORMAL HIGH (ref 0.00–0.07)
Basophils Absolute: 0.3 10*3/uL — ABNORMAL HIGH (ref 0.0–0.1)
Basophils Relative: 2 %
Eosinophils Absolute: 0.8 10*3/uL — ABNORMAL HIGH (ref 0.0–0.5)
Eosinophils Relative: 6 %
HCT: 37.4 % (ref 36.0–46.0)
Hemoglobin: 11.5 g/dL — ABNORMAL LOW (ref 12.0–15.0)
Immature Granulocytes: 5 %
Lymphocytes Relative: 14 %
Lymphs Abs: 2 10*3/uL (ref 0.7–4.0)
MCH: 28.8 pg (ref 26.0–34.0)
MCHC: 30.7 g/dL (ref 30.0–36.0)
MCV: 93.7 fL (ref 80.0–100.0)
Monocytes Absolute: 1.2 10*3/uL — ABNORMAL HIGH (ref 0.1–1.0)
Monocytes Relative: 9 %
Neutro Abs: 8.8 10*3/uL — ABNORMAL HIGH (ref 1.7–7.7)
Neutrophils Relative %: 64 %
Platelets: 922 10*3/uL (ref 150–400)
RBC: 3.99 MIL/uL (ref 3.87–5.11)
RDW: 21.9 % — ABNORMAL HIGH (ref 11.5–15.5)
Smear Review: INCREASED
WBC: 13.7 10*3/uL — ABNORMAL HIGH (ref 4.0–10.5)
nRBC: 0 % (ref 0.0–0.2)

## 2021-08-20 LAB — PATHOLOGIST SMEAR REVIEW

## 2021-08-20 MED ORDER — RUXOLITINIB PHOSPHATE 5 MG PO TABS
5.0000 mg | ORAL_TABLET | Freq: Two times a day (BID) | ORAL | 1 refills | Status: DC
  Filled 2021-08-20: qty 30, 15d supply, fill #0
  Filled 2021-09-11: qty 30, 15d supply, fill #1

## 2021-08-20 NOTE — Telephone Encounter (Signed)
Faxed medication dose change of patient's Jakafi to [facility] at [phone]. New dose is to take 1 tablet ('5mg'$ ) by mouth twice daily.

## 2021-08-20 NOTE — Progress Notes (Addendum)
Hematology Oncology Progress Note   Clinic Day:  08/20/2021   Referring physician: Glean Hess, MD  Chief Complaint: Laura Wilcox is a 88 y.o. female with polycythemia vera (PV) and secondary myelofibrosis presents for follow up   Laura Wilcox is a 88 y.o.afemale who has above oncology history reviewed by me today presented for follow up visit for management of  polycythemia vera  Patient previously followed up by Dr.Corcoran, patient switched care to me on 11/16/20 Extensive medical record review was performed by me  Feb 2014 polycythemia vera and secondary myelofibrosis.  She presented in 04/2002 with thrombocytosis (835,000).  JAK2 V617F was positive   Bone marrow was on 04/15/2002 revealed a normocellular marrow for age with trilineage hematopoiesis and mild megakaryocytic hyperplasia with focal atypia.  There was no morphologic evidence of a myeloproliferative disorder.  Iron stains were inadequate for evaluation of storage iron.  There was no increase in reticulin fibers.  She was started on Agrylin in 2012  Bone marrow on 09/25/2012 revealed persistent myeloproliferative disorder s/p Agrylin.  The current features were best classified as persistent myeloproliferative neoplasm (MPN) JAK2 V617F mutation positive.  The current features of markedly increased atypical megakaryocytes paresis, with frequent clustering with frequent hyper/deeply lobulated forms, bizarre forms and forms with staghorn nuclei, hyperchromatic forms and abundant cytoplasm was highly suggestive of a myeloproliferative neoplasm.  Marrow was hypercellular for age (30-50%) with increased trilineage hematopoiesis and markedly increased atypical megakaryopoiesis with frequent clustering.  There were no increased blasts.  Iron stores were absent.  There was moderate to severe myelofibrosis (grade MF 2-3).  Differential includes ET, PV, primary myelofibrosis, and MPN, unclassifiable.   Cytogenetics were normal (33, XX). She developed pancytopenia in 12/2015.  Etiology is unclear (medication confusion or hydroxyurea + Agrylin).   She received Procrit 40,000 units (intermittently 09/2012 - 06/2016), Venofer (09/2012 - 10/2012), and Infed (12/2015 - 03/2016).  She received Neupogen during a period of neutropenia.   She was anagrelide from 2004 - 08/13/2017.  She began [place] on 08/14/2017.  Jakafi was increased from 5 mg BID to 10 mg BID on 06/17/2018.  Jakafi was increased to 15 mg BID on 08/03/2018.  She has been back on Jakafi 5 mg BID since 02/01/2019 then 10 mg BID on 05/25/2019.  [name] was on hold from 12/20/2019 - 01/12/2020.  Jakafi 5 mg twice daily restarted on 01/12/2020 then increased to 10 mg in the morning and 5 mg in the evening on 02/02/2020. [name] is currently 20 mg po BID.  She has received Retacrit to maintain a hemoglobin > 10 (12/24/2019 and 03   Squamous cell carcinoma left cheek- s/p cryotherapy and debridement. Managed by Dr. Nehemiah Massed  10/22/2020- 10/26/2020 Hospitalization due to acute metabolic encephalopathy, UTI,E. coli bacteremia AKI on CKD, acute on chronic anemia. [name] was held during the admission. She was discharged and spent a short period of time in rehab. [name] was held.   11/06/2020 seen by symptom management for weakness.  11/16/2020 established care with me. Resumed Jakafi at '10mg'$  BID  #Dementia/cognitive impairment- patient has hx of dementia. Per daughter, it has been difficult to convince patent to see neurology for further evaluation. She is not on any medication for her memory loss.  She has been referred to home health.  06/25/2021 - 07/02/2021,Patient was hospitalized due to UTI, generalized weakness Jakafi was.  Held. 07/25/2021, resumed on Jakafi 5 mg daily.  INTERVAL HISTORY Julianne Chamberlin is a 88 y.o. female who has above  history reviewed by me today presents for follow up visit for management of polycythemia vera and secondary  myelofibrosis.   Patient was accompanied by daughter [name]  Patient had a bilateral lower extremity swelling at the last visit.  07/25/2021, bilateral lower extremity ultrasound were negative for DVT.  During the interval, patient had cryotherapy of inflamed seborrheic keratosis of right popliteal fossa.  Per daughter patient patient was prescribed on a course of antibiotics.  She has also squamous cell carcinoma on her left mid cheek and the left lateral eye.  Dermatology recommended radiation.  Patient has an appointment with Dr. Baruch Gouty this week.  Patient is a poor historian.  Per daughter, left lower extremity swelling has completely resolved.  They have noticed right ankle swelling since this morning.  Past Medical History:  Diagnosis Date   Acute metabolic encephalopathy 09/29/1599   Confusion 12/30/2018   Polycythemia    SCC (squamous cell carcinoma) 04/11/2021   R med dorsum hand - ED&C SCCIS   Squamous cell carcinoma of skin 09/06/2020   R thumb - ED&C   Urinary tract infection without hematuria 01/02/2020   Urticaria 06/02/2017    Past Surgical History:  Procedure Laterality Date   ABDOMINAL HYSTERECTOMY     complete   BREAST SURGERY      Family History  Problem Relation Age of Onset   Heart disease Father    Cancer Sister        breast   Cancer Sister        breast   Alzheimer's disease Sister     Social History:  reports that she has never smoked. She has never used smokeless tobacco. She reports that she does not drink alcohol and does not use drugs.   Allergies:  Allergies  Allergen Reactions   Bactrim [Sulfamethoxazole-Trimethoprim]     Due to kidney disease    Epinephrine Other (See Comments)    Current Medications: Current Outpatient Medications  Medication Sig Dispense Refill   aspirin EC 81 MG tablet Take 1 tablet (81 mg total) by mouth daily. Swallow whole. 30 tablet 11   calcitRIOL (ROCALTROL) 0.25 MCG capsule Take 0.25 mcg by mouth every  Monday, Wednesday, and Friday.     ciprofloxacin (CIPRO) 250 MG tablet Take 1 tablet (250 mg total) by mouth daily with breakfast for 7 days. 7 tablet 0   citalopram (CELEXA) 10 MG tablet Take 1 tablet (10 mg total) by mouth daily. 30 tablet 2   feeding supplement (ENSURE ENLIVE / ENSURE PLUS) LIQD Take 237 mLs by mouth 2 (two) times daily between meals. 237 mL 12   mupirocin ointment (BACTROBAN) 2 % Apply to wound every other day 22 g 1   polyethylene glycol (MIRALAX / GLYCOLAX) 17 g packet Take 17 g by mouth daily as needed for mild constipation. 14 each 0   vitamin B-12 1000 MCG tablet Take 1 tablet (1,000 mcg total) by mouth daily.     acetaminophen (TYLENOL) 325 MG tablet Take 325-650 mg by mouth every 6 (six) hours as needed for mild pain or moderate pain. (Patient not taking: Reported on 06/18/2021)     ruxolitinib phosphate (JAKAFI) 5 MG tablet Take 1 tablet (5 mg total) by mouth 2 (two) times daily. 30 tablet 1   No current facility-administered medications for this visit.    Review of Systems  Unable to perform ROS: Dementia   Performance status (ECOG):  2  Vitals Blood pressure 131/64, pulse 79, temperature (!) 96.6 F (35.9  C), temperature source Tympanic, weight 112 lb (50.8 kg).   Physical Exam Vitals reviewed.  Constitutional:      General: She is not in acute distress.    Appearance: She is well-developed.     Comments: Frail appearance, patient sits in the wheelchair  HENT:     Head: Normocephalic and atraumatic.     Nose: Nose normal. No congestion.     Mouth/Throat:     Pharynx: No oropharyngeal exudate.  Eyes:     General: No scleral icterus.    Conjunctiva/sclera: Conjunctivae normal.  Cardiovascular:     Rate and Rhythm: Normal rate and regular rhythm.     Pulses: Normal pulses.     Heart sounds: No murmur heard. Pulmonary:     Effort: Pulmonary effort is normal. No respiratory distress.  Abdominal:     General: There is no distension.     Tenderness:  There is no abdominal tenderness.  Musculoskeletal:        General: No deformity. Normal range of motion.     Right lower leg: Edema present.     Left lower leg: No edema.  Skin:    General: Skin is warm and dry.     Coloration: Skin is not pale.     Findings: Bruising present.  Neurological:     Mental Status: She is alert. Mental status is at baseline.  Psychiatric:        Attention and Perception: Attention normal.        Mood and Affect: Mood and affect normal.        Behavior: Behavior is cooperative.        Cognition and Memory: Cognition is impaired. Memory is impaired.    Appointment on 08/20/2021  Component Date Value Ref Range Status   Sodium 08/20/2021 135  135 - 145 mmol/L Final   Potassium 08/20/2021 4.4  3.5 - 5.1 mmol/L Final   Chloride 08/20/2021 100  98 - 111 mmol/L Final   CO2 08/20/2021 27  22 - 32 mmol/L Final   Glucose, Bld 08/20/2021 166 (H)  70 - 99 mg/dL Final   Glucose reference range applies only to samples taken after fasting for at least 8 hours.   BUN 08/20/2021 23  8 - 23 mg/dL Final   Creatinine, Ser 08/20/2021 1.13 (H)  0.44 - 1.00 mg/dL Final   Calcium 08/20/2021 8.6 (L)  8.9 - 10.3 mg/dL Final   Total Protein 08/20/2021 7.0  6.5 - 8.1 g/dL Final   Albumin 08/20/2021 3.8  3.5 - 5.0 g/dL Final   AST 08/20/2021 20  15 - 41 U/L Final   ALT 08/20/2021 12  0 - 44 U/L Final   Alkaline Phosphatase 08/20/2021 96  38 - 126 U/L Final   Total Bilirubin 08/20/2021 0.5  0.3 - 1.2 mg/dL Final   GFR, Estimated 08/20/2021 46 (L)  >60 mL/min Final   Comment: (NOTE) Calculated using the CKD-EPI Creatinine Equation (2021)    Anion gap 08/20/2021 8  5 - 15 Final   Performed at [facility], [place],  [address]   WBC 08/20/2021 13.7 (H)  4.0 - 10.5 K/uL Final   RBC 08/20/2021 3.99  3.87 - 5.11 MIL/uL Final   Hemoglobin 08/20/2021 11.5 (L)  12.0 - 15.0 g/dL Final   HCT 08/20/2021 37.4  36.0 - 46.0 % Final   MCV 08/20/2021 93.7   80.0 - 100.0 fL Final   MCH 08/20/2021 28.8  26.0 - 34.0  pg Final   MCHC 08/20/2021 30.7  30.0 - 36.0 g/dL Final   RDW 08/20/2021 21.9 (H)  11.5 - 15.5 % Final   Platelets 08/20/2021 922 (HH)  150 - 400 K/uL Final   Comment: SPECIMEN CHECKED FOR CLOTS THIS CRITICAL RESULT HAS VERIFIED AND BEEN CALLED TO [facility]NABLE,S BY CHASE ISLEY ON 05 22 2023 AT 70, AND HAS BEEN READ BACK.     nRBC 08/20/2021 0.0  0.0 - 0.2 % Final   Neutrophils Relative % 08/20/2021 64  % Final   Neutro Abs 08/20/2021 8.8 (H)  1.7 - 7.7 K/uL Final   Lymphocytes Relative 08/20/2021 14  % Final   Lymphs Abs 08/20/2021 2.0  0.7 - 4.0 K/uL Final   Monocytes Relative 08/20/2021 9  % Final   Monocytes Absolute 08/20/2021 1.2 (H)  0.1 - 1.0 K/uL Final   Eosinophils Relative 08/20/2021 6  % Final   Eosinophils Absolute 08/20/2021 0.8 (H)  0.0 - 0.5 K/uL Final   Basophils Relative 08/20/2021 2  % Final   Basophils Absolute 08/20/2021 0.3 (H)  0.0 - 0.1 K/uL Final   WBC Morphology 08/20/2021 MORPHOLOGY UNREMARKABLE   Final   RBC Morphology 08/20/2021 MORPHOLOGY UNREMARKABLE   Final   Smear Review 08/20/2021 PLATELETS APPEAR INCREASED   Final   Comment: PLATELET COUNT CONFIRMED BY SMEAR LARGE PLTS NOTED    Immature Granulocytes 08/20/2021 5  % Final   Abs Immature Granulocytes 08/20/2021 0.64 (H)  0.00 - 0.07 K/uL Final   Giant PLTs 08/20/2021 PRESENT   Final   Performed at [facility], [address] [place], [place] [address]   Path Review 08/20/2021 Blood smear is reviewed.   Final   Comment: Patient with known polycythyemia vera and myelofibrosis and on Jakafi. Leukocytosis with left shift. Blasts are not identified.  Normocytic anemia. Thrombocytosis with giant platelets. Findings are consistent with the history of persistent myeloproliferative neoplasm. Reviewed by Dellia Nims Reuel Derby, M.D. Performed at [facility], [place], [place] [address]    [facility]     Component Value Date/Time    [place] 135 08/20/2021 1242   NA 134 06/18/2021 1514   K 4.4 08/20/2021 1242   CL 100 08/20/2021 1242   CO2 27 08/20/2021 1242   GLUCOSE 166 (H) 08/20/2021 1242   BUN 23 08/20/2021 1242   BUN 19 06/18/2021 1514   CREATININE 1.13 (H) 08/20/2021 1242   CALCIUM 8.6 (L) 08/20/2021 1242   PROT 7.0 08/20/2021 1242   PROT 5.8 (L) 06/18/2021 1514   ALBUMIN 3.8 08/20/2021 1242   ALBUMIN 3.6 06/18/2021 1514   AST 20 08/20/2021 1242   ALT 12 08/20/2021 1242   ALKPHOS 96 08/20/2021 1242   BILITOT 0.5 08/20/2021 1242   BILITOT 0.3 06/18/2021 1514   GFRNONAA 46 (L) 08/20/2021 1242   GFRAA 49 (L) 12/27/2019 0935    Assessment and Plan.  1. Myelofibrosis ([place])   2. Polycythemia vera ([place])   3. Anemia in stage 3b chronic kidney disease ([place])   4. Swelling of lower extremity    # Polycythemia vera and  secondary myelofibrosis. Patient has decreased functionality, multiple medical problems, and she is at a risk of developing complication while on antineoplasm treatments. Labs reviewed and discussed Platelet count is at 922,000.  Could be secondary to myelofibrosis/P vera, there might been a component of acute inflammation secondary to recent dermatology procedure. Patient progress Jakafi 5 mg daily.  I recommend to increase to 5 mg twice  daily Continue aspirin 81 mg daily.  .# Anemia due to CKD, and myelofibrosis.   Hemoglobin has improved.  No need for Retacrit.  #Bilateral lower extremity edema, right worse than left.   No DVT on recent bilateral lower extremity ultrasound.  Recommend leg elevation and compression stocking.  #Squamous cell carcinoma of the skin.  Her dermatologist has recommended patient to see radiation oncologist.  Offered patient for home health for medication management.  Daughter and patient preferred to defer home health at this point.  Follow up in 4 weeks.   Earlie Server, MD, PhD 08/20/2021

## 2021-08-21 ENCOUNTER — Telehealth: Payer: Self-pay

## 2021-08-21 ENCOUNTER — Ambulatory Visit
Admission: RE | Admit: 2021-08-21 | Discharge: 2021-08-21 | Disposition: A | Discharge status: Home / Self Care | Payer: Medicare Other | Source: Ambulatory Visit | Attending: Radiation Oncology | Admitting: Radiation Oncology

## 2021-08-21 ENCOUNTER — Other Ambulatory Visit (HOSPITAL_COMMUNITY): Payer: Self-pay

## 2021-08-21 VITALS — BP 117/66 | HR 89 | Temp 98.5°F | Resp 18 | Ht 67.0 in | Wt 112.0 lb

## 2021-08-21 DIAGNOSIS — R262 Difficulty in walking, not elsewhere classified: Secondary | ICD-10-CM | POA: Diagnosis not present

## 2021-08-21 DIAGNOSIS — Z809 Family history of malignant neoplasm, unspecified: Secondary | ICD-10-CM | POA: Diagnosis not present

## 2021-08-21 DIAGNOSIS — R2681 Unsteadiness on feet: Secondary | ICD-10-CM | POA: Diagnosis not present

## 2021-08-21 DIAGNOSIS — F02C0 Dementia in other diseases classified elsewhere, severe, without behavioral disturbance, psychotic disturbance, mood disturbance, and anxiety: Secondary | ICD-10-CM | POA: Diagnosis not present

## 2021-08-21 DIAGNOSIS — Z7982 Long term (current) use of aspirin: Secondary | ICD-10-CM | POA: Insufficient documentation

## 2021-08-21 DIAGNOSIS — Z79899 Other long term (current) drug therapy: Secondary | ICD-10-CM | POA: Insufficient documentation

## 2021-08-21 DIAGNOSIS — C060 Malignant neoplasm of cheek mucosa: Secondary | ICD-10-CM | POA: Insufficient documentation

## 2021-08-21 DIAGNOSIS — D45 Polycythemia vera: Secondary | ICD-10-CM | POA: Diagnosis not present

## 2021-08-21 DIAGNOSIS — C44329 Squamous cell carcinoma of skin of other parts of face: Secondary | ICD-10-CM

## 2021-08-21 DIAGNOSIS — M6281 Muscle weakness (generalized): Secondary | ICD-10-CM | POA: Diagnosis not present

## 2021-08-21 DIAGNOSIS — N1832 Chronic kidney disease, stage 3b: Secondary | ICD-10-CM | POA: Diagnosis not present

## 2021-08-21 DIAGNOSIS — G459 Transient cerebral ischemic attack, unspecified: Secondary | ICD-10-CM | POA: Diagnosis not present

## 2021-08-21 NOTE — Consult Note (Signed)
NEW PATIENT EVALUATION  Name: Laura Wilcox  MRN: [id number]  Date:   08/21/2021     DOB: [birth year]   This 88 y.o. female patient presents to the clinic for initial evaluation of evaluation of a squamous cell carcinoma of her left cheek and patient with known polycythemia vera and secondary myelofibrosis.  REFERRING PHYSICIAN: Glean Hess, MD  CHIEF COMPLAINT:  Chief Complaint  Patient presents with   Consult    SCC Left cheek    DIAGNOSIS: The encounter diagnosis was Squamous cell cancer of skin of left cheek.   PREVIOUS INVESTIGATIONS:  Pathology reports reviewed CT scan of the head reviewed Clinical notes reviewed  HPI: Patient is a 88 year old female with dementia and hard of hearing who had been asked to evaluate for a squamous cell carcinoma of the left cheek.  She is under Dr. Collie Siad care for management of polycythemia vera as well as secondary myelofibrosis. She has previously had 6 cryotherapy as well as debridement of a squamous cell carcinoma of the left cheek she is now referred to ration collagen for consideration of treatment.  She has a larger mass centered on the left cheek +1 on the lateral aspect of the left orbit.  She does not appear to be in any pain or discomfort at this time.  Patient has CT scan of her head back in March showing no significant change from prior studies no acute intracranial process mild cortical atrophy and moderate chronic ischemic white matter changes PLANNED TREATMENT REGIMEN: Electron-beam therapy  PAST MEDICAL HISTORY:  has a past medical history of Acute metabolic encephalopathy (40/98/1191), Confusion (12/30/2018), Polycythemia, SCC (squamous cell carcinoma) (04/11/2021), Squamous cell carcinoma of skin (09/06/2020), Urinary tract infection without hematuria (01/02/2020), and Urticaria (06/02/2017).    PAST SURGICAL HISTORY:  Past Surgical History:  Procedure Laterality Date   ABDOMINAL HYSTERECTOMY     complete   BREAST  SURGERY      FAMILY HISTORY: family history includes Alzheimer's disease in her sister; Cancer in her sister and sister; Heart disease in her father.  SOCIAL HISTORY:  reports that she has never smoked. She has never used smokeless tobacco. She reports that she does not drink alcohol and does not use drugs.  ALLERGIES: Bactrim [sulfamethoxazole-trimethoprim] and Epinephrine  MEDICATIONS:  Current Outpatient Medications  Medication Sig Dispense Refill   acetaminophen (TYLENOL) 325 MG tablet Take 325-650 mg by mouth every 6 (six) hours as needed for mild pain or moderate pain. (Patient not taking: Reported on 06/18/2021)     aspirin EC 81 MG tablet Take 1 tablet (81 mg total) by mouth daily. Swallow whole. 30 tablet 11   calcitRIOL (ROCALTROL) 0.25 MCG capsule Take 0.25 mcg by mouth every Monday, Wednesday, and Friday.     ciprofloxacin (CIPRO) 250 MG tablet Take 1 tablet (250 mg total) by mouth daily with breakfast for 7 days. 7 tablet 0   citalopram (CELEXA) 10 MG tablet Take 1 tablet (10 mg total) by mouth daily. 30 tablet 2   feeding supplement (ENSURE ENLIVE / ENSURE PLUS) LIQD Take 237 mLs by mouth 2 (two) times daily between meals. 237 mL 12   mupirocin ointment (BACTROBAN) 2 % Apply to wound every other day 22 g 1   polyethylene glycol (MIRALAX / GLYCOLAX) 17 g packet Take 17 g by mouth daily as needed for mild constipation. 14 each 0   ruxolitinib phosphate (JAKAFI) 5 MG tablet Take 1 tablet (5 mg total) by mouth 2 (two) times daily. 30 tablet  1   vitamin B-12 1000 MCG tablet Take 1 tablet (1,000 mcg total) by mouth daily.     No current facility-administered medications for this encounter.    ECOG PERFORMANCE STATUS:  0 - Asymptomatic  REVIEW OF SYSTEMS: Patient has known polycythemia vera with myelofibrosis and dementia with cognitive impairment Patient denies any weight loss, fatigue, weakness, fever, chills or night sweats. Patient denies any loss of vision, blurred vision.  Patient denies any ringing  of the ears or hearing loss. No irregular heartbeat. Patient denies heart murmur or history of fainting. Patient denies any chest pain or pain radiating to her upper extremities. Patient denies any shortness of breath, difficulty breathing at night, cough or hemoptysis. Patient denies any swelling in the lower legs. Patient denies any nausea vomiting, vomiting of blood, or coffee ground material in the vomitus. Patient denies any stomach pain. Patient states has had normal bowel movements no significant constipation or diarrhea. Patient denies any dysuria, hematuria or significant nocturia. Patient denies any problems walking, swelling in the joints or loss of balance. Patient denies any skin changes, loss of hair or loss of weight. Patient denies any excessive worrying or anxiety or significant depression. Patient denies any problems with insomnia. Patient denies excessive thirst, polyuria, polydipsia. Patient denies any swollen glands, patient denies easy bruising or easy bleeding. Patient denies any recent infections, allergies or URI. Patient "s visual fields have not changed significantly in recent time.   PHYSICAL EXAM: BP 117/66   Pulse 89   Temp 98.5 F (36.9 C)   Resp 18   Ht '5\' 7"'$  (1.702 m)   Wt 112 lb (50.8 kg)   BMI 17.54 kg/m  A large raised lesion of the left cheek also satellite lesion on the left lateral aspect of the left orbit no evidence of submental or cervical adenopathy is identified.  Patient is wheelchair-bound well-developed well-nourished patient in NAD. HEENT reveals PERLA, EOMI, discs not visualized.  Oral cavity is clear. No oral mucosal lesions are identified. Neck is clear without evidence of cervical or supraclavicular adenopathy. Lungs are clear to A&P. Cardiac examination is essentially unremarkable with regular rate and rhythm without murmur rub or thrill. Abdomen is benign with no organomegaly or masses noted. Motor sensory and DTR levels  are equal and symmetric in the upper and lower extremities. Cranial nerves II through XII are grossly intact. Proprioception is intact. No peripheral adenopathy or edema is identified. No motor or sensory levels are noted. Crude visual fields are within normal range.  LABORATORY DATA: Pathology reports reviewed    RADIOLOGY RESULTS: Head CT scan reviewed   IMPRESSION: Squamous cell carcinoma left cheek in 88 year old female  PLAN: At this time elect to go ahead with electron-beam therapy.  Do not know if I can Compass both lesions on the left cheek and left lateral aspect of the orbit we will know that better when I do CT simulation.  I would plan on delivering 50 Gray in 25 fractions.  Risks and benefits of treatment including skin reaction fatigue and permanent skin changes all reviewed with the patient and her daughter.  They both comprehend my treatment plan well.  I have set up for CT simulation.  I would like to take this opportunity to thank you for allowing me to participate in the care of your patient.Noreene Filbert, MD

## 2021-08-21 NOTE — Telephone Encounter (Signed)
Pt daughter [name] calling, she took her mom for radiation today, [name] was wondering if her mom was suppose to have radiation on the 2 spots on her face, discussed with [name] referral sent to radiation on May 15 was for [facility] on the left mid cheek and left lateral eye.

## 2021-08-22 ENCOUNTER — Ambulatory Visit: Admission: RE | Admit: 2021-08-22 | Payer: Medicare Other | Source: Ambulatory Visit

## 2021-08-22 DIAGNOSIS — R262 Difficulty in walking, not elsewhere classified: Secondary | ICD-10-CM | POA: Diagnosis not present

## 2021-08-22 DIAGNOSIS — M6281 Muscle weakness (generalized): Secondary | ICD-10-CM | POA: Diagnosis not present

## 2021-08-22 DIAGNOSIS — N1832 Chronic kidney disease, stage 3b: Secondary | ICD-10-CM | POA: Diagnosis not present

## 2021-08-22 DIAGNOSIS — C44329 Squamous cell carcinoma of skin of other parts of face: Secondary | ICD-10-CM | POA: Insufficient documentation

## 2021-08-22 DIAGNOSIS — G459 Transient cerebral ischemic attack, unspecified: Secondary | ICD-10-CM | POA: Diagnosis not present

## 2021-08-22 DIAGNOSIS — C060 Malignant neoplasm of cheek mucosa: Secondary | ICD-10-CM | POA: Diagnosis not present

## 2021-08-22 DIAGNOSIS — F02C0 Dementia in other diseases classified elsewhere, severe, without behavioral disturbance, psychotic disturbance, mood disturbance, and anxiety: Secondary | ICD-10-CM | POA: Diagnosis not present

## 2021-08-22 DIAGNOSIS — R2681 Unsteadiness on feet: Secondary | ICD-10-CM | POA: Diagnosis not present

## 2021-08-23 ENCOUNTER — Other Ambulatory Visit: Payer: PRIVATE HEALTH INSURANCE

## 2021-08-23 ENCOUNTER — Ambulatory Visit: Payer: PRIVATE HEALTH INSURANCE | Admitting: Oncology

## 2021-08-23 ENCOUNTER — Ambulatory Visit: Payer: PRIVATE HEALTH INSURANCE

## 2021-08-24 DIAGNOSIS — M6281 Muscle weakness (generalized): Secondary | ICD-10-CM | POA: Diagnosis not present

## 2021-08-24 DIAGNOSIS — G459 Transient cerebral ischemic attack, unspecified: Secondary | ICD-10-CM | POA: Diagnosis not present

## 2021-08-24 DIAGNOSIS — F02C0 Dementia in other diseases classified elsewhere, severe, without behavioral disturbance, psychotic disturbance, mood disturbance, and anxiety: Secondary | ICD-10-CM | POA: Diagnosis not present

## 2021-08-24 DIAGNOSIS — R262 Difficulty in walking, not elsewhere classified: Secondary | ICD-10-CM | POA: Diagnosis not present

## 2021-08-24 DIAGNOSIS — N1832 Chronic kidney disease, stage 3b: Secondary | ICD-10-CM | POA: Diagnosis not present

## 2021-08-24 DIAGNOSIS — R2681 Unsteadiness on feet: Secondary | ICD-10-CM | POA: Diagnosis not present

## 2021-08-26 ENCOUNTER — Encounter: Payer: Self-pay | Admitting: Dermatology

## 2021-08-28 DIAGNOSIS — R262 Difficulty in walking, not elsewhere classified: Secondary | ICD-10-CM | POA: Diagnosis not present

## 2021-08-28 DIAGNOSIS — M6281 Muscle weakness (generalized): Secondary | ICD-10-CM | POA: Diagnosis not present

## 2021-08-28 DIAGNOSIS — G459 Transient cerebral ischemic attack, unspecified: Secondary | ICD-10-CM | POA: Diagnosis not present

## 2021-08-28 DIAGNOSIS — N1832 Chronic kidney disease, stage 3b: Secondary | ICD-10-CM | POA: Diagnosis not present

## 2021-08-28 DIAGNOSIS — R2681 Unsteadiness on feet: Secondary | ICD-10-CM | POA: Diagnosis not present

## 2021-08-28 DIAGNOSIS — F02C0 Dementia in other diseases classified elsewhere, severe, without behavioral disturbance, psychotic disturbance, mood disturbance, and anxiety: Secondary | ICD-10-CM | POA: Diagnosis not present

## 2021-08-29 DIAGNOSIS — G459 Transient cerebral ischemic attack, unspecified: Secondary | ICD-10-CM | POA: Diagnosis not present

## 2021-08-29 DIAGNOSIS — R2681 Unsteadiness on feet: Secondary | ICD-10-CM | POA: Diagnosis not present

## 2021-08-29 DIAGNOSIS — N1832 Chronic kidney disease, stage 3b: Secondary | ICD-10-CM | POA: Diagnosis not present

## 2021-08-29 DIAGNOSIS — R262 Difficulty in walking, not elsewhere classified: Secondary | ICD-10-CM | POA: Diagnosis not present

## 2021-08-29 DIAGNOSIS — F02C0 Dementia in other diseases classified elsewhere, severe, without behavioral disturbance, psychotic disturbance, mood disturbance, and anxiety: Secondary | ICD-10-CM | POA: Diagnosis not present

## 2021-08-29 DIAGNOSIS — M6281 Muscle weakness (generalized): Secondary | ICD-10-CM | POA: Diagnosis not present

## 2021-08-30 DIAGNOSIS — C44329 Squamous cell carcinoma of skin of other parts of face: Secondary | ICD-10-CM | POA: Insufficient documentation

## 2021-08-30 DIAGNOSIS — F330 Major depressive disorder, recurrent, mild: Secondary | ICD-10-CM | POA: Diagnosis not present

## 2021-08-30 DIAGNOSIS — F02B4 Dementia in other diseases classified elsewhere, moderate, with anxiety: Secondary | ICD-10-CM | POA: Diagnosis not present

## 2021-08-30 DIAGNOSIS — R2689 Other abnormalities of gait and mobility: Secondary | ICD-10-CM | POA: Diagnosis not present

## 2021-08-30 DIAGNOSIS — D45 Polycythemia vera: Secondary | ICD-10-CM | POA: Diagnosis not present

## 2021-08-30 DIAGNOSIS — F02C0 Dementia in other diseases classified elsewhere, severe, without behavioral disturbance, psychotic disturbance, mood disturbance, and anxiety: Secondary | ICD-10-CM | POA: Diagnosis not present

## 2021-08-30 DIAGNOSIS — G459 Transient cerebral ischemic attack, unspecified: Secondary | ICD-10-CM | POA: Diagnosis not present

## 2021-08-30 DIAGNOSIS — R262 Difficulty in walking, not elsewhere classified: Secondary | ICD-10-CM | POA: Diagnosis not present

## 2021-08-30 DIAGNOSIS — R627 Adult failure to thrive: Secondary | ICD-10-CM | POA: Diagnosis not present

## 2021-08-30 DIAGNOSIS — G301 Alzheimer's disease with late onset: Secondary | ICD-10-CM | POA: Diagnosis not present

## 2021-08-30 DIAGNOSIS — C4339 Malignant melanoma of other parts of face: Secondary | ICD-10-CM | POA: Diagnosis not present

## 2021-08-30 DIAGNOSIS — C060 Malignant neoplasm of cheek mucosa: Secondary | ICD-10-CM | POA: Diagnosis not present

## 2021-08-30 DIAGNOSIS — N1832 Chronic kidney disease, stage 3b: Secondary | ICD-10-CM | POA: Diagnosis not present

## 2021-08-30 DIAGNOSIS — R278 Other lack of coordination: Secondary | ICD-10-CM | POA: Diagnosis not present

## 2021-08-30 DIAGNOSIS — M6281 Muscle weakness (generalized): Secondary | ICD-10-CM | POA: Diagnosis not present

## 2021-08-30 DIAGNOSIS — Q845 Enlarged and hypertrophic nails: Secondary | ICD-10-CM | POA: Diagnosis not present

## 2021-08-30 DIAGNOSIS — R269 Unspecified abnormalities of gait and mobility: Secondary | ICD-10-CM | POA: Diagnosis not present

## 2021-08-30 DIAGNOSIS — I739 Peripheral vascular disease, unspecified: Secondary | ICD-10-CM | POA: Diagnosis not present

## 2021-08-30 DIAGNOSIS — E441 Mild protein-calorie malnutrition: Secondary | ICD-10-CM | POA: Diagnosis not present

## 2021-08-30 DIAGNOSIS — R2681 Unsteadiness on feet: Secondary | ICD-10-CM | POA: Diagnosis not present

## 2021-08-31 DIAGNOSIS — G459 Transient cerebral ischemic attack, unspecified: Secondary | ICD-10-CM | POA: Diagnosis not present

## 2021-08-31 DIAGNOSIS — R262 Difficulty in walking, not elsewhere classified: Secondary | ICD-10-CM | POA: Diagnosis not present

## 2021-08-31 DIAGNOSIS — N1832 Chronic kidney disease, stage 3b: Secondary | ICD-10-CM | POA: Diagnosis not present

## 2021-08-31 DIAGNOSIS — M6281 Muscle weakness (generalized): Secondary | ICD-10-CM | POA: Diagnosis not present

## 2021-08-31 DIAGNOSIS — F02C0 Dementia in other diseases classified elsewhere, severe, without behavioral disturbance, psychotic disturbance, mood disturbance, and anxiety: Secondary | ICD-10-CM | POA: Diagnosis not present

## 2021-08-31 DIAGNOSIS — R2681 Unsteadiness on feet: Secondary | ICD-10-CM | POA: Diagnosis not present

## 2021-09-03 ENCOUNTER — Other Ambulatory Visit: Payer: Self-pay

## 2021-09-03 ENCOUNTER — Ambulatory Visit
Admission: RE | Admit: 2021-09-03 | Discharge: 2021-09-03 | Disposition: A | Discharge status: Home / Self Care | Payer: Medicare Other | Source: Ambulatory Visit | Attending: Radiation Oncology | Admitting: Radiation Oncology

## 2021-09-03 DIAGNOSIS — C060 Malignant neoplasm of cheek mucosa: Secondary | ICD-10-CM | POA: Diagnosis not present

## 2021-09-03 DIAGNOSIS — R2681 Unsteadiness on feet: Secondary | ICD-10-CM | POA: Diagnosis not present

## 2021-09-03 DIAGNOSIS — F02C0 Dementia in other diseases classified elsewhere, severe, without behavioral disturbance, psychotic disturbance, mood disturbance, and anxiety: Secondary | ICD-10-CM | POA: Diagnosis not present

## 2021-09-03 DIAGNOSIS — N1832 Chronic kidney disease, stage 3b: Secondary | ICD-10-CM | POA: Diagnosis not present

## 2021-09-03 DIAGNOSIS — M6281 Muscle weakness (generalized): Secondary | ICD-10-CM | POA: Diagnosis not present

## 2021-09-03 DIAGNOSIS — G459 Transient cerebral ischemic attack, unspecified: Secondary | ICD-10-CM | POA: Diagnosis not present

## 2021-09-03 DIAGNOSIS — R262 Difficulty in walking, not elsewhere classified: Secondary | ICD-10-CM | POA: Diagnosis not present

## 2021-09-03 DIAGNOSIS — C44329 Squamous cell carcinoma of skin of other parts of face: Secondary | ICD-10-CM | POA: Diagnosis not present

## 2021-09-03 LAB — RAD ONC ARIA SESSION SUMMARY
Course Elapsed Days: 0
Plan Fractions Treated to Date: 1
Plan Prescribed Dose Per Fraction: 2 Gy
Plan Total Fractions Prescribed: 25
Plan Total Prescribed Dose: 50 Gy
Reference Point Dosage Given to Date: 2 Gy
Reference Point Session Dosage Given: 2 Gy
Session Number: 1

## 2021-09-04 ENCOUNTER — Ambulatory Visit
Admission: RE | Admit: 2021-09-04 | Discharge: 2021-09-04 | Disposition: A | Discharge status: Home / Self Care | Payer: Medicare Other | Source: Ambulatory Visit | Attending: Radiation Oncology | Admitting: Radiation Oncology

## 2021-09-04 ENCOUNTER — Other Ambulatory Visit: Payer: Self-pay

## 2021-09-04 DIAGNOSIS — N1832 Chronic kidney disease, stage 3b: Secondary | ICD-10-CM | POA: Diagnosis not present

## 2021-09-04 DIAGNOSIS — C44329 Squamous cell carcinoma of skin of other parts of face: Secondary | ICD-10-CM | POA: Diagnosis not present

## 2021-09-04 DIAGNOSIS — R262 Difficulty in walking, not elsewhere classified: Secondary | ICD-10-CM | POA: Diagnosis not present

## 2021-09-04 DIAGNOSIS — R2681 Unsteadiness on feet: Secondary | ICD-10-CM | POA: Diagnosis not present

## 2021-09-04 DIAGNOSIS — F02C0 Dementia in other diseases classified elsewhere, severe, without behavioral disturbance, psychotic disturbance, mood disturbance, and anxiety: Secondary | ICD-10-CM | POA: Diagnosis not present

## 2021-09-04 DIAGNOSIS — M6281 Muscle weakness (generalized): Secondary | ICD-10-CM | POA: Diagnosis not present

## 2021-09-04 DIAGNOSIS — G459 Transient cerebral ischemic attack, unspecified: Secondary | ICD-10-CM | POA: Diagnosis not present

## 2021-09-04 LAB — RAD ONC ARIA SESSION SUMMARY
Course Elapsed Days: 1
Plan Fractions Treated to Date: 2
Plan Prescribed Dose Per Fraction: 2 Gy
Plan Total Fractions Prescribed: 25
Plan Total Prescribed Dose: 50 Gy
Reference Point Dosage Given to Date: 4 Gy
Reference Point Session Dosage Given: 2 Gy
Session Number: 2

## 2021-09-05 ENCOUNTER — Ambulatory Visit
Admission: RE | Admit: 2021-09-05 | Discharge: 2021-09-05 | Disposition: A | Discharge status: Home / Self Care | Payer: Medicare Other | Source: Ambulatory Visit | Attending: Radiation Oncology | Admitting: Radiation Oncology

## 2021-09-05 ENCOUNTER — Other Ambulatory Visit: Payer: Self-pay

## 2021-09-05 DIAGNOSIS — R262 Difficulty in walking, not elsewhere classified: Secondary | ICD-10-CM | POA: Diagnosis not present

## 2021-09-05 DIAGNOSIS — C44329 Squamous cell carcinoma of skin of other parts of face: Secondary | ICD-10-CM | POA: Diagnosis not present

## 2021-09-05 DIAGNOSIS — N1832 Chronic kidney disease, stage 3b: Secondary | ICD-10-CM | POA: Diagnosis not present

## 2021-09-05 DIAGNOSIS — M6281 Muscle weakness (generalized): Secondary | ICD-10-CM | POA: Diagnosis not present

## 2021-09-05 DIAGNOSIS — R2681 Unsteadiness on feet: Secondary | ICD-10-CM | POA: Diagnosis not present

## 2021-09-05 DIAGNOSIS — G459 Transient cerebral ischemic attack, unspecified: Secondary | ICD-10-CM | POA: Diagnosis not present

## 2021-09-05 DIAGNOSIS — F02C0 Dementia in other diseases classified elsewhere, severe, without behavioral disturbance, psychotic disturbance, mood disturbance, and anxiety: Secondary | ICD-10-CM | POA: Diagnosis not present

## 2021-09-05 LAB — RAD ONC ARIA SESSION SUMMARY
Course Elapsed Days: 2
Plan Fractions Treated to Date: 3
Plan Prescribed Dose Per Fraction: 2 Gy
Plan Total Fractions Prescribed: 25
Plan Total Prescribed Dose: 50 Gy
Reference Point Dosage Given to Date: 6 Gy
Reference Point Session Dosage Given: 2 Gy
Session Number: 3

## 2021-09-06 ENCOUNTER — Other Ambulatory Visit: Payer: Self-pay

## 2021-09-06 ENCOUNTER — Ambulatory Visit
Admission: RE | Admit: 2021-09-06 | Discharge: 2021-09-06 | Disposition: A | Discharge status: Home / Self Care | Payer: Medicare Other | Source: Ambulatory Visit | Attending: Radiation Oncology | Admitting: Radiation Oncology

## 2021-09-06 DIAGNOSIS — C44329 Squamous cell carcinoma of skin of other parts of face: Secondary | ICD-10-CM | POA: Diagnosis not present

## 2021-09-06 DIAGNOSIS — F02C0 Dementia in other diseases classified elsewhere, severe, without behavioral disturbance, psychotic disturbance, mood disturbance, and anxiety: Secondary | ICD-10-CM | POA: Diagnosis not present

## 2021-09-06 DIAGNOSIS — R2681 Unsteadiness on feet: Secondary | ICD-10-CM | POA: Diagnosis not present

## 2021-09-06 DIAGNOSIS — N1832 Chronic kidney disease, stage 3b: Secondary | ICD-10-CM | POA: Diagnosis not present

## 2021-09-06 DIAGNOSIS — R262 Difficulty in walking, not elsewhere classified: Secondary | ICD-10-CM | POA: Diagnosis not present

## 2021-09-06 DIAGNOSIS — G459 Transient cerebral ischemic attack, unspecified: Secondary | ICD-10-CM | POA: Diagnosis not present

## 2021-09-06 DIAGNOSIS — M6281 Muscle weakness (generalized): Secondary | ICD-10-CM | POA: Diagnosis not present

## 2021-09-06 LAB — RAD ONC ARIA SESSION SUMMARY
Course Elapsed Days: 3
Plan Fractions Treated to Date: 4
Plan Prescribed Dose Per Fraction: 2 Gy
Plan Total Fractions Prescribed: 25
Plan Total Prescribed Dose: 50 Gy
Reference Point Dosage Given to Date: 8 Gy
Reference Point Session Dosage Given: 2 Gy
Session Number: 4

## 2021-09-07 ENCOUNTER — Ambulatory Visit
Admission: RE | Admit: 2021-09-07 | Discharge: 2021-09-07 | Disposition: A | Discharge status: Home / Self Care | Payer: Medicare Other | Source: Ambulatory Visit | Attending: Radiation Oncology | Admitting: Radiation Oncology

## 2021-09-07 ENCOUNTER — Other Ambulatory Visit: Payer: Self-pay

## 2021-09-07 DIAGNOSIS — C44329 Squamous cell carcinoma of skin of other parts of face: Secondary | ICD-10-CM | POA: Diagnosis not present

## 2021-09-07 LAB — RAD ONC ARIA SESSION SUMMARY
Course Elapsed Days: 4
Plan Fractions Treated to Date: 5
Plan Prescribed Dose Per Fraction: 2 Gy
Plan Total Fractions Prescribed: 25
Plan Total Prescribed Dose: 50 Gy
Reference Point Dosage Given to Date: 10 Gy
Reference Point Session Dosage Given: 2 Gy
Session Number: 5

## 2021-09-10 ENCOUNTER — Other Ambulatory Visit: Payer: Self-pay

## 2021-09-10 ENCOUNTER — Ambulatory Visit
Admission: RE | Admit: 2021-09-10 | Discharge: 2021-09-10 | Disposition: A | Discharge status: Home / Self Care | Payer: Medicare Other | Source: Ambulatory Visit | Attending: Radiation Oncology | Admitting: Radiation Oncology

## 2021-09-10 DIAGNOSIS — F02C0 Dementia in other diseases classified elsewhere, severe, without behavioral disturbance, psychotic disturbance, mood disturbance, and anxiety: Secondary | ICD-10-CM | POA: Diagnosis not present

## 2021-09-10 DIAGNOSIS — C44329 Squamous cell carcinoma of skin of other parts of face: Secondary | ICD-10-CM | POA: Diagnosis not present

## 2021-09-10 DIAGNOSIS — R2681 Unsteadiness on feet: Secondary | ICD-10-CM | POA: Diagnosis not present

## 2021-09-10 DIAGNOSIS — M6281 Muscle weakness (generalized): Secondary | ICD-10-CM | POA: Diagnosis not present

## 2021-09-10 DIAGNOSIS — C060 Malignant neoplasm of cheek mucosa: Secondary | ICD-10-CM | POA: Diagnosis not present

## 2021-09-10 DIAGNOSIS — R262 Difficulty in walking, not elsewhere classified: Secondary | ICD-10-CM | POA: Diagnosis not present

## 2021-09-10 DIAGNOSIS — G459 Transient cerebral ischemic attack, unspecified: Secondary | ICD-10-CM | POA: Diagnosis not present

## 2021-09-10 DIAGNOSIS — N1832 Chronic kidney disease, stage 3b: Secondary | ICD-10-CM | POA: Diagnosis not present

## 2021-09-10 LAB — RAD ONC ARIA SESSION SUMMARY
Course Elapsed Days: 7
Plan Fractions Treated to Date: 6
Plan Prescribed Dose Per Fraction: 2 Gy
Plan Total Fractions Prescribed: 25
Plan Total Prescribed Dose: 50 Gy
Reference Point Dosage Given to Date: 12 Gy
Reference Point Session Dosage Given: 2 Gy
Session Number: 6

## 2021-09-11 ENCOUNTER — Other Ambulatory Visit (HOSPITAL_COMMUNITY): Payer: Self-pay

## 2021-09-11 ENCOUNTER — Ambulatory Visit
Admission: RE | Admit: 2021-09-11 | Discharge: 2021-09-11 | Disposition: A | Discharge status: Home / Self Care | Payer: Medicare Other | Source: Ambulatory Visit | Attending: Radiation Oncology | Admitting: Radiation Oncology

## 2021-09-11 ENCOUNTER — Other Ambulatory Visit: Payer: Self-pay

## 2021-09-11 DIAGNOSIS — C44329 Squamous cell carcinoma of skin of other parts of face: Secondary | ICD-10-CM | POA: Diagnosis not present

## 2021-09-11 DIAGNOSIS — R262 Difficulty in walking, not elsewhere classified: Secondary | ICD-10-CM | POA: Diagnosis not present

## 2021-09-11 DIAGNOSIS — M6281 Muscle weakness (generalized): Secondary | ICD-10-CM | POA: Diagnosis not present

## 2021-09-11 DIAGNOSIS — F02C0 Dementia in other diseases classified elsewhere, severe, without behavioral disturbance, psychotic disturbance, mood disturbance, and anxiety: Secondary | ICD-10-CM | POA: Diagnosis not present

## 2021-09-11 DIAGNOSIS — G459 Transient cerebral ischemic attack, unspecified: Secondary | ICD-10-CM | POA: Diagnosis not present

## 2021-09-11 DIAGNOSIS — R2681 Unsteadiness on feet: Secondary | ICD-10-CM | POA: Diagnosis not present

## 2021-09-11 DIAGNOSIS — N1832 Chronic kidney disease, stage 3b: Secondary | ICD-10-CM | POA: Diagnosis not present

## 2021-09-11 LAB — RAD ONC ARIA SESSION SUMMARY
Course Elapsed Days: 8
Plan Fractions Treated to Date: 7
Plan Prescribed Dose Per Fraction: 2 Gy
Plan Total Fractions Prescribed: 25
Plan Total Prescribed Dose: 50 Gy
Reference Point Dosage Given to Date: 14 Gy
Reference Point Session Dosage Given: 2 Gy
Session Number: 7

## 2021-09-12 ENCOUNTER — Ambulatory Visit
Admission: RE | Admit: 2021-09-12 | Discharge: 2021-09-12 | Disposition: A | Discharge status: Home / Self Care | Payer: Medicare Other | Source: Ambulatory Visit | Attending: Radiation Oncology | Admitting: Radiation Oncology

## 2021-09-12 ENCOUNTER — Other Ambulatory Visit: Payer: Self-pay

## 2021-09-12 DIAGNOSIS — M6281 Muscle weakness (generalized): Secondary | ICD-10-CM | POA: Diagnosis not present

## 2021-09-12 DIAGNOSIS — N1832 Chronic kidney disease, stage 3b: Secondary | ICD-10-CM | POA: Diagnosis not present

## 2021-09-12 DIAGNOSIS — R262 Difficulty in walking, not elsewhere classified: Secondary | ICD-10-CM | POA: Diagnosis not present

## 2021-09-12 DIAGNOSIS — F02C0 Dementia in other diseases classified elsewhere, severe, without behavioral disturbance, psychotic disturbance, mood disturbance, and anxiety: Secondary | ICD-10-CM | POA: Diagnosis not present

## 2021-09-12 DIAGNOSIS — G459 Transient cerebral ischemic attack, unspecified: Secondary | ICD-10-CM | POA: Diagnosis not present

## 2021-09-12 DIAGNOSIS — C44329 Squamous cell carcinoma of skin of other parts of face: Secondary | ICD-10-CM | POA: Diagnosis not present

## 2021-09-12 DIAGNOSIS — R2681 Unsteadiness on feet: Secondary | ICD-10-CM | POA: Diagnosis not present

## 2021-09-12 LAB — RAD ONC ARIA SESSION SUMMARY
Course Elapsed Days: 9
Plan Fractions Treated to Date: 8
Plan Prescribed Dose Per Fraction: 2 Gy
Plan Total Fractions Prescribed: 25
Plan Total Prescribed Dose: 50 Gy
Reference Point Dosage Given to Date: 16 Gy
Reference Point Session Dosage Given: 2 Gy
Session Number: 8

## 2021-09-13 ENCOUNTER — Other Ambulatory Visit: Payer: Self-pay

## 2021-09-13 ENCOUNTER — Ambulatory Visit
Admission: RE | Admit: 2021-09-13 | Discharge: 2021-09-13 | Disposition: A | Discharge status: Home / Self Care | Payer: Medicare Other | Source: Ambulatory Visit | Attending: Radiation Oncology | Admitting: Radiation Oncology

## 2021-09-13 DIAGNOSIS — F02B4 Dementia in other diseases classified elsewhere, moderate, with anxiety: Secondary | ICD-10-CM | POA: Diagnosis not present

## 2021-09-13 DIAGNOSIS — N1832 Chronic kidney disease, stage 3b: Secondary | ICD-10-CM | POA: Diagnosis not present

## 2021-09-13 DIAGNOSIS — M6281 Muscle weakness (generalized): Secondary | ICD-10-CM | POA: Diagnosis not present

## 2021-09-13 DIAGNOSIS — C44329 Squamous cell carcinoma of skin of other parts of face: Secondary | ICD-10-CM | POA: Diagnosis not present

## 2021-09-13 DIAGNOSIS — I739 Peripheral vascular disease, unspecified: Secondary | ICD-10-CM | POA: Diagnosis not present

## 2021-09-13 DIAGNOSIS — R262 Difficulty in walking, not elsewhere classified: Secondary | ICD-10-CM | POA: Diagnosis not present

## 2021-09-13 DIAGNOSIS — Q845 Enlarged and hypertrophic nails: Secondary | ICD-10-CM | POA: Diagnosis not present

## 2021-09-13 DIAGNOSIS — G459 Transient cerebral ischemic attack, unspecified: Secondary | ICD-10-CM | POA: Diagnosis not present

## 2021-09-13 DIAGNOSIS — D45 Polycythemia vera: Secondary | ICD-10-CM | POA: Diagnosis not present

## 2021-09-13 DIAGNOSIS — Z79899 Other long term (current) drug therapy: Secondary | ICD-10-CM | POA: Diagnosis not present

## 2021-09-13 DIAGNOSIS — G301 Alzheimer's disease with late onset: Secondary | ICD-10-CM | POA: Diagnosis not present

## 2021-09-13 DIAGNOSIS — F02C0 Dementia in other diseases classified elsewhere, severe, without behavioral disturbance, psychotic disturbance, mood disturbance, and anxiety: Secondary | ICD-10-CM | POA: Diagnosis not present

## 2021-09-13 DIAGNOSIS — C4339 Malignant melanoma of other parts of face: Secondary | ICD-10-CM | POA: Diagnosis not present

## 2021-09-13 DIAGNOSIS — R2681 Unsteadiness on feet: Secondary | ICD-10-CM | POA: Diagnosis not present

## 2021-09-13 DIAGNOSIS — R269 Unspecified abnormalities of gait and mobility: Secondary | ICD-10-CM | POA: Diagnosis not present

## 2021-09-13 DIAGNOSIS — E441 Mild protein-calorie malnutrition: Secondary | ICD-10-CM | POA: Diagnosis not present

## 2021-09-13 LAB — RAD ONC ARIA SESSION SUMMARY
Course Elapsed Days: 10
Plan Fractions Treated to Date: 9
Plan Prescribed Dose Per Fraction: 2 Gy
Plan Total Fractions Prescribed: 25
Plan Total Prescribed Dose: 50 Gy
Reference Point Dosage Given to Date: 18 Gy
Reference Point Session Dosage Given: 2 Gy
Session Number: 9

## 2021-09-14 ENCOUNTER — Ambulatory Visit
Admission: RE | Admit: 2021-09-14 | Discharge: 2021-09-14 | Disposition: A | Discharge status: Home / Self Care | Payer: Medicare Other | Source: Ambulatory Visit | Attending: Radiation Oncology | Admitting: Radiation Oncology

## 2021-09-14 ENCOUNTER — Other Ambulatory Visit: Payer: Self-pay

## 2021-09-14 DIAGNOSIS — C44329 Squamous cell carcinoma of skin of other parts of face: Secondary | ICD-10-CM | POA: Diagnosis not present

## 2021-09-14 LAB — RAD ONC ARIA SESSION SUMMARY
Course Elapsed Days: 11
Plan Fractions Treated to Date: 10
Plan Prescribed Dose Per Fraction: 2 Gy
Plan Total Fractions Prescribed: 25
Plan Total Prescribed Dose: 50 Gy
Reference Point Dosage Given to Date: 20 Gy
Reference Point Session Dosage Given: 2 Gy
Session Number: 10

## 2021-09-17 ENCOUNTER — Other Ambulatory Visit: Payer: Self-pay

## 2021-09-17 ENCOUNTER — Inpatient Hospital Stay: Payer: Medicare Other

## 2021-09-17 ENCOUNTER — Ambulatory Visit
Admission: RE | Admit: 2021-09-17 | Discharge: 2021-09-17 | Disposition: A | Discharge status: Home / Self Care | Payer: Medicare Other | Source: Ambulatory Visit | Attending: Radiation Oncology | Admitting: Radiation Oncology

## 2021-09-17 ENCOUNTER — Inpatient Hospital Stay (HOSPITAL_BASED_OUTPATIENT_CLINIC_OR_DEPARTMENT_OTHER): Payer: Medicare Other | Admitting: Oncology

## 2021-09-17 ENCOUNTER — Encounter: Payer: Self-pay | Admitting: Oncology

## 2021-09-17 ENCOUNTER — Other Ambulatory Visit (HOSPITAL_COMMUNITY): Payer: Self-pay

## 2021-09-17 VITALS — BP 126/57 | HR 78 | Temp 97.4°F | Resp 18 | Wt 113.2 lb

## 2021-09-17 DIAGNOSIS — Z85828 Personal history of other malignant neoplasm of skin: Secondary | ICD-10-CM | POA: Insufficient documentation

## 2021-09-17 DIAGNOSIS — Z79899 Other long term (current) drug therapy: Secondary | ICD-10-CM | POA: Insufficient documentation

## 2021-09-17 DIAGNOSIS — D631 Anemia in chronic kidney disease: Secondary | ICD-10-CM | POA: Insufficient documentation

## 2021-09-17 DIAGNOSIS — D471 Chronic myeloproliferative disease: Secondary | ICD-10-CM | POA: Insufficient documentation

## 2021-09-17 DIAGNOSIS — Z881 Allergy status to other antibiotic agents status: Secondary | ICD-10-CM | POA: Insufficient documentation

## 2021-09-17 DIAGNOSIS — G459 Transient cerebral ischemic attack, unspecified: Secondary | ICD-10-CM | POA: Diagnosis not present

## 2021-09-17 DIAGNOSIS — N1832 Chronic kidney disease, stage 3b: Secondary | ICD-10-CM | POA: Insufficient documentation

## 2021-09-17 DIAGNOSIS — D7581 Myelofibrosis: Secondary | ICD-10-CM | POA: Diagnosis not present

## 2021-09-17 DIAGNOSIS — Z818 Family history of other mental and behavioral disorders: Secondary | ICD-10-CM | POA: Insufficient documentation

## 2021-09-17 DIAGNOSIS — Z5111 Encounter for antineoplastic chemotherapy: Secondary | ICD-10-CM | POA: Diagnosis not present

## 2021-09-17 DIAGNOSIS — C44329 Squamous cell carcinoma of skin of other parts of face: Secondary | ICD-10-CM | POA: Diagnosis not present

## 2021-09-17 DIAGNOSIS — D45 Polycythemia vera: Secondary | ICD-10-CM | POA: Insufficient documentation

## 2021-09-17 DIAGNOSIS — Z87440 Personal history of urinary (tract) infections: Secondary | ICD-10-CM | POA: Insufficient documentation

## 2021-09-17 DIAGNOSIS — F02C0 Dementia in other diseases classified elsewhere, severe, without behavioral disturbance, psychotic disturbance, mood disturbance, and anxiety: Secondary | ICD-10-CM | POA: Diagnosis not present

## 2021-09-17 DIAGNOSIS — Z803 Family history of malignant neoplasm of breast: Secondary | ICD-10-CM | POA: Insufficient documentation

## 2021-09-17 DIAGNOSIS — M6281 Muscle weakness (generalized): Secondary | ICD-10-CM | POA: Diagnosis not present

## 2021-09-17 DIAGNOSIS — C060 Malignant neoplasm of cheek mucosa: Secondary | ICD-10-CM | POA: Diagnosis not present

## 2021-09-17 DIAGNOSIS — D709 Neutropenia, unspecified: Secondary | ICD-10-CM | POA: Insufficient documentation

## 2021-09-17 DIAGNOSIS — F0390 Unspecified dementia without behavioral disturbance: Secondary | ICD-10-CM | POA: Insufficient documentation

## 2021-09-17 DIAGNOSIS — Z8249 Family history of ischemic heart disease and other diseases of the circulatory system: Secondary | ICD-10-CM | POA: Insufficient documentation

## 2021-09-17 DIAGNOSIS — R262 Difficulty in walking, not elsewhere classified: Secondary | ICD-10-CM | POA: Diagnosis not present

## 2021-09-17 DIAGNOSIS — R2681 Unsteadiness on feet: Secondary | ICD-10-CM | POA: Diagnosis not present

## 2021-09-17 LAB — CBC WITH DIFFERENTIAL/PLATELET
Abs Immature Granulocytes: 0.74 10*3/uL — ABNORMAL HIGH (ref 0.00–0.07)
Basophils Absolute: 0.3 10*3/uL — ABNORMAL HIGH (ref 0.0–0.1)
Basophils Relative: 2 %
Eosinophils Absolute: 0.8 10*3/uL — ABNORMAL HIGH (ref 0.0–0.5)
Eosinophils Relative: 7 %
HCT: 37.7 % (ref 36.0–46.0)
Hemoglobin: 11.6 g/dL — ABNORMAL LOW (ref 12.0–15.0)
Immature Granulocytes: 6 %
Lymphocytes Relative: 16 %
Lymphs Abs: 2.1 10*3/uL (ref 0.7–4.0)
MCH: 28.4 pg (ref 26.0–34.0)
MCHC: 30.8 g/dL (ref 30.0–36.0)
MCV: 92.2 fL (ref 80.0–100.0)
Monocytes Absolute: 1.1 10*3/uL — ABNORMAL HIGH (ref 0.1–1.0)
Monocytes Relative: 9 %
Neutro Abs: 7.9 10*3/uL — ABNORMAL HIGH (ref 1.7–7.7)
Neutrophils Relative %: 60 %
Platelets: 911 10*3/uL (ref 150–400)
RBC: 4.09 MIL/uL (ref 3.87–5.11)
RDW: 22.4 % — ABNORMAL HIGH (ref 11.5–15.5)
Smear Review: INCREASED
WBC: 13 10*3/uL — ABNORMAL HIGH (ref 4.0–10.5)
nRBC: 0 % (ref 0.0–0.2)

## 2021-09-17 LAB — COMPREHENSIVE METABOLIC PANEL
ALT: 11 U/L (ref 0–44)
AST: 17 U/L (ref 15–41)
Albumin: 3.9 g/dL (ref 3.5–5.0)
Alkaline Phosphatase: 101 U/L (ref 38–126)
Anion gap: 9 (ref 5–15)
BUN: 39 mg/dL — ABNORMAL HIGH (ref 8–23)
CO2: 26 mmol/L (ref 22–32)
Calcium: 8.9 mg/dL (ref 8.9–10.3)
Chloride: 101 mmol/L (ref 98–111)
Creatinine, Ser: 1.33 mg/dL — ABNORMAL HIGH (ref 0.44–1.00)
GFR, Estimated: 38 mL/min — ABNORMAL LOW (ref >60)
Glucose, Bld: 152 mg/dL — ABNORMAL HIGH (ref 70–99)
Potassium: 4.9 mmol/L (ref 3.5–5.1)
Sodium: 136 mmol/L (ref 135–145)
Total Bilirubin: 0.4 mg/dL (ref 0.3–1.2)
Total Protein: 7 g/dL (ref 6.5–8.1)

## 2021-09-17 LAB — RAD ONC ARIA SESSION SUMMARY
Course Elapsed Days: 14
Plan Fractions Treated to Date: 11
Plan Prescribed Dose Per Fraction: 2 Gy
Plan Total Fractions Prescribed: 25
Plan Total Prescribed Dose: 50 Gy
Reference Point Dosage Given to Date: 22 Gy
Reference Point Session Dosage Given: 2 Gy
Session Number: 11

## 2021-09-17 LAB — TECHNOLOGIST SMEAR REVIEW

## 2021-09-17 NOTE — Progress Notes (Unsigned)
Pt here for follow up. She is currently receiving radiation to left cheek squamous cell cancer.

## 2021-09-18 ENCOUNTER — Other Ambulatory Visit: Payer: Self-pay

## 2021-09-18 ENCOUNTER — Other Ambulatory Visit (HOSPITAL_COMMUNITY): Payer: Self-pay

## 2021-09-18 ENCOUNTER — Ambulatory Visit
Admission: RE | Admit: 2021-09-18 | Discharge: 2021-09-18 | Disposition: A | Discharge status: Home / Self Care | Payer: Medicare Other | Source: Ambulatory Visit | Attending: Radiation Oncology | Admitting: Radiation Oncology

## 2021-09-18 DIAGNOSIS — N1832 Chronic kidney disease, stage 3b: Secondary | ICD-10-CM | POA: Diagnosis not present

## 2021-09-18 DIAGNOSIS — R262 Difficulty in walking, not elsewhere classified: Secondary | ICD-10-CM | POA: Diagnosis not present

## 2021-09-18 DIAGNOSIS — F02C0 Dementia in other diseases classified elsewhere, severe, without behavioral disturbance, psychotic disturbance, mood disturbance, and anxiety: Secondary | ICD-10-CM | POA: Diagnosis not present

## 2021-09-18 DIAGNOSIS — M6281 Muscle weakness (generalized): Secondary | ICD-10-CM | POA: Diagnosis not present

## 2021-09-18 DIAGNOSIS — R2681 Unsteadiness on feet: Secondary | ICD-10-CM | POA: Diagnosis not present

## 2021-09-18 DIAGNOSIS — G459 Transient cerebral ischemic attack, unspecified: Secondary | ICD-10-CM | POA: Diagnosis not present

## 2021-09-18 DIAGNOSIS — C44329 Squamous cell carcinoma of skin of other parts of face: Secondary | ICD-10-CM | POA: Diagnosis not present

## 2021-09-18 LAB — RAD ONC ARIA SESSION SUMMARY
Course Elapsed Days: 15
Plan Fractions Treated to Date: 12
Plan Prescribed Dose Per Fraction: 2 Gy
Plan Total Fractions Prescribed: 25
Plan Total Prescribed Dose: 50 Gy
Reference Point Dosage Given to Date: 24 Gy
Reference Point Session Dosage Given: 2 Gy
Session Number: 12

## 2021-09-18 NOTE — Progress Notes (Unsigned)
Hematology Oncology Progress Note   Clinic Day:  09/18/2021   Referring physician: Glean Hess, MD  Chief Complaint: Laura Wilcox is a 88 y.o. female with polycythemia vera (PV) and secondary myelofibrosis presents for follow up   Laura Wilcox is a 88 y.o.afemale who has above oncology history reviewed by me today presented for follow up visit for management of  polycythemia vera  Patient previously followed up by Dr.Corcoran, patient switched care to me on 11/16/20 Extensive medical record review was performed by me  Feb 2014 polycythemia vera and secondary myelofibrosis.  She presented in 04/2002 with thrombocytosis (835,000).  JAK2 V617F was positive   Bone marrow was on 04/15/2002 revealed a normocellular marrow for age with trilineage hematopoiesis and mild megakaryocytic hyperplasia with focal atypia.  There was no morphologic evidence of a myeloproliferative disorder.  Iron stains were inadequate for evaluation of storage iron.  There was no increase in reticulin fibers.  She was started on Agrylin in 2012  Bone marrow on 09/25/2012 revealed persistent myeloproliferative disorder s/p Agrylin.  The current features were best classified as persistent myeloproliferative neoplasm (MPN) JAK2 V617F mutation positive.  The current features of markedly increased atypical megakaryocytes paresis, with frequent clustering with frequent hyper/deeply lobulated forms, bizarre forms and forms with staghorn nuclei, hyperchromatic forms and abundant cytoplasm was highly suggestive of a myeloproliferative neoplasm.  Marrow was hypercellular for age (30-50%) with increased trilineage hematopoiesis and markedly increased atypical megakaryopoiesis with frequent clustering.  There were no increased blasts.  Iron stores were absent.  There was moderate to severe myelofibrosis (grade MF 2-3).  Differential includes ET, PV, primary myelofibrosis, and MPN, unclassifiable.   Cytogenetics were normal (44, XX). She developed pancytopenia in 12/2015.  Etiology is unclear (medication confusion or hydroxyurea + Agrylin).   She received Procrit 40,000 units (intermittently 09/2012 - 06/2016), Venofer (09/2012 - 10/2012), and Infed (12/2015 - 03/2016).  She received Neupogen during a period of neutropenia.   She was anagrelide from 2004 - 08/13/2017.  She began [place] on 08/14/2017.  Jakafi was increased from 5 mg BID to 10 mg BID on 06/17/2018.  Jakafi was increased to 15 mg BID on 08/03/2018.  She has been back on Jakafi 5 mg BID since 02/01/2019 then 10 mg BID on 05/25/2019.  [name] was on hold from 12/20/2019 - 01/12/2020.  Jakafi 5 mg twice daily restarted on 01/12/2020 then increased to 10 mg in the morning and 5 mg in the evening on 02/02/2020. [name] is currently 20 mg po BID.  She has received Retacrit to maintain a hemoglobin > 10 (12/24/2019 and 03   Squamous cell carcinoma left cheek- s/p cryotherapy and debridement. Managed by Dr. Nehemiah Massed  10/22/2020- 10/26/2020 Hospitalization due to acute metabolic encephalopathy, UTI,E. coli bacteremia AKI on CKD, acute on chronic anemia. [name] was held during the admission. She was discharged and spent a short period of time in rehab. [name] was held.   11/06/2020 seen by symptom management for weakness.  11/16/2020 established care with me. Resumed Jakafi at '10mg'$  BID  #Dementia/cognitive impairment- patient has hx of dementia. Per daughter, it has been difficult to convince patent to see neurology for further evaluation. She is not on any medication for her memory loss.  She has been referred to home health.  06/25/2021 - 07/02/2021,Patient was hospitalized due to UTI, generalized weakness Jakafi was.  Held. 07/25/2021, resumed on Jakafi 5 mg daily.  07/25/2021, bilateral lower extremity ultrasound were negative for DVT. 08/20/2021, increased [employer]  to 5 mg twice daily. INTERVAL HISTORY Laura Wilcox is a 88 y.o. female who  has above history reviewed by me today presents for follow up visit for management of polycythemia vera and secondary myelofibrosis.   Patient was accompanied by daughter [name]  Patient had a bilateral lower extremity swelling at the last visit.   During the interval, patient had cryotherapy of inflamed seborrheic keratosis of right popliteal fossa.  Per daughter patient patient was prescribed on a course of antibiotics.  She has also squamous cell carcinoma on her left mid cheek and the left lateral eye.  Dermatology recommended radiation.  Patient has an appointment with Dr. Baruch Gouty this week.  Patient is a poor historian.  Per daughter, left lower extremity swelling has completely resolved.  They have noticed right ankle swelling since this morning.  Past Medical History:  Diagnosis Date   Acute metabolic encephalopathy 87/56/4332   Confusion 12/30/2018   Polycythemia    SCC (squamous cell carcinoma) 04/11/2021   R med dorsum hand - ED&C SCCIS   Squamous cell carcinoma of skin 09/06/2020   R thumb - ED&C   Urinary tract infection without hematuria 01/02/2020   Urticaria 06/02/2017    Past Surgical History:  Procedure Laterality Date   ABDOMINAL HYSTERECTOMY     complete   BREAST SURGERY      Family History  Problem Relation Age of Onset   Heart disease Father    Cancer Sister        breast   Cancer Sister        breast   Alzheimer's disease Sister     Social History:  reports that she has never smoked. She has never used smokeless tobacco. She reports that she does not drink alcohol and does not use drugs.   Allergies:  Allergies  Allergen Reactions   Bactrim [Sulfamethoxazole-Trimethoprim]     Due to kidney disease    Epinephrine Other (See Comments)    Current Medications: Current Outpatient Medications  Medication Sig Dispense Refill   aspirin EC 81 MG tablet Take 1 tablet (81 mg total) by mouth daily. Swallow whole. 30 tablet 11   calcitRIOL (ROCALTROL)  0.25 MCG capsule Take 0.25 mcg by mouth every Monday, Wednesday, and Friday.     citalopram (CELEXA) 10 MG tablet Take 1 tablet (10 mg total) by mouth daily. 30 tablet 2   feeding supplement (ENSURE ENLIVE / ENSURE PLUS) LIQD Take 237 mLs by mouth 2 (two) times daily between meals. 237 mL 12   mupirocin ointment (BACTROBAN) 2 % Apply to wound every other day 22 g 1   polyethylene glycol (MIRALAX / GLYCOLAX) 17 g packet Take 17 g by mouth daily as needed for mild constipation. 14 each 0   ruxolitinib phosphate (JAKAFI) 5 MG tablet Take 1 tablet (5 mg total) by mouth 2 (two) times daily. 30 tablet 1   vitamin B-12 1000 MCG tablet Take 1 tablet (1,000 mcg total) by mouth daily.     acetaminophen (TYLENOL) 325 MG tablet Take 325-650 mg by mouth every 6 (six) hours as needed for mild pain or moderate pain. (Patient not taking: Reported on 06/18/2021)     No current facility-administered medications for this visit.    Review of Systems  Unable to perform ROS: Dementia    Performance status (ECOG):  2  Vitals Blood pressure (!) 126/57, pulse 78, temperature (!) 97.4 F (36.3 C), resp. rate 18, weight 113 lb 3.2 oz (51.3 kg).   Physical  Exam Vitals reviewed.  Constitutional:      General: She is not in acute distress.    Appearance: She is well-developed.     Comments: Frail appearance, patient sits in the wheelchair  HENT:     Head: Normocephalic and atraumatic.     Nose: Nose normal. No congestion.     Mouth/Throat:     Pharynx: No oropharyngeal exudate.  Eyes:     General: No scleral icterus.    Conjunctiva/sclera: Conjunctivae normal.  Cardiovascular:     Rate and Rhythm: Normal rate and regular rhythm.     Pulses: Normal pulses.     Heart sounds: No murmur heard. Pulmonary:     Effort: Pulmonary effort is normal. No respiratory distress.  Abdominal:     General: There is no distension.     Tenderness: There is no abdominal tenderness.  Musculoskeletal:        General: No  deformity. Normal range of motion.     Right lower leg: Edema present.     Left lower leg: No edema.  Skin:    General: Skin is warm and dry.     Coloration: Skin is not pale.     Findings: Bruising present.  Neurological:     Mental Status: She is alert. Mental status is at baseline.  Psychiatric:        Attention and Perception: Attention normal.        Mood and Affect: Mood and affect normal.        Behavior: Behavior is cooperative.        Cognition and Memory: Cognition is impaired. Memory is impaired.     Orders Only on 09/17/2021  Component Date Value Ref Range Status   Tech Review 09/17/2021 REFER TO CBC/DIFF FROM 09/17/2021   Final   Performed at [facility], [address] [place], [place] [address]  Orders Only on 09/17/2021  Component Date Value Ref Range Status   Sodium 09/17/2021 136  135 - 145 mmol/L Final   Potassium 09/17/2021 4.9  3.5 - 5.1 mmol/L Final   Chloride 09/17/2021 101  98 - 111 mmol/L Final   CO2 09/17/2021 26  22 - 32 mmol/L Final   Glucose, Bld 09/17/2021 152 (H)  70 - 99 mg/dL Final   Glucose reference range applies only to samples taken after fasting for at least 8 hours.   BUN 09/17/2021 39 (H)  8 - 23 mg/dL Final   Creatinine, Ser 09/17/2021 1.33 (H)  0.44 - 1.00 mg/dL Final   Calcium 09/17/2021 8.9  8.9 - 10.3 mg/dL Final   Total Protein 09/17/2021 7.0  6.5 - 8.1 g/dL Final   Albumin 09/17/2021 3.9  3.5 - 5.0 g/dL Final   AST 09/17/2021 17  15 - 41 U/L Final   ALT 09/17/2021 11  0 - 44 U/L Final   Alkaline Phosphatase 09/17/2021 101  38 - 126 U/L Final   Total Bilirubin 09/17/2021 0.4  0.3 - 1.2 mg/dL Final   GFR, Estimated 09/17/2021 38 (L)  >60 mL/min Final   Comment: (NOTE) Calculated using the CKD-EPI Creatinine Equation (2021)    Anion gap 09/17/2021 9  5 - 15 Final   Performed at [facility], [place], [place] [address]   WBC 09/17/2021 13.0 (H)  4.0 - 10.5 K/uL Final   RBC 09/17/2021 4.09  3.87 -  5.11 MIL/uL Final   Hemoglobin 09/17/2021 11.6 (L)  12.0 - 15.0 g/dL Final  HCT 09/17/2021 37.7  36.0 - 46.0 % Final   MCV 09/17/2021 92.2  80.0 - 100.0 fL Final   MCH 09/17/2021 28.4  26.0 - 34.0 pg Final   MCHC 09/17/2021 30.8  30.0 - 36.0 g/dL Final   RDW 09/17/2021 22.4 (H)  11.5 - 15.5 % Final   Platelets 09/17/2021 911 (HH)  150 - 400 K/uL Final   Comment: PLATELET COUNT CONFIRMED BY SMEAR THIS CRITICAL RESULT HAS VERIFIED AND BEEN CALLED TO ELIZ ABETH SANTOS BY KIM ROOS ON 06 19 2023 AT [id number], AND HAS BEEN READ BACK.     nRBC 09/17/2021 0.0  0.0 - 0.2 % Final   Neutrophils Relative % 09/17/2021 60  % Final   Neutro Abs 09/17/2021 7.9 (H)  1.7 - 7.7 K/uL Final   Lymphocytes Relative 09/17/2021 16  % Final   Lymphs Abs 09/17/2021 2.1  0.7 - 4.0 K/uL Final   Monocytes Relative 09/17/2021 9  % Final   Monocytes Absolute 09/17/2021 1.1 (H)  0.1 - 1.0 K/uL Final   Eosinophils Relative 09/17/2021 7  % Final   Eosinophils Absolute 09/17/2021 0.8 (H)  0.0 - 0.5 K/uL Final   Basophils Relative 09/17/2021 2  % Final   Basophils Absolute 09/17/2021 0.3 (H)  0.0 - 0.1 K/uL Final   WBC Morphology 09/17/2021 MILD LEFT SHIFT (1-5% METAS, OCC MYELO, OCC BANDS)   Final   DIFF CONFIRMED BY MANUAL.   RBC Morphology 09/17/2021 MIXED RBC POPULATION.   Final   Smear Review 09/17/2021 PLATELETS APPEAR INCREASED   Final   PLATELETS VARY IN SIZE.   Immature Granulocytes 09/17/2021 6  % Final   Abs Immature Granulocytes 09/17/2021 0.74 (H)  0.00 - 0.07 K/uL Final   Performed at [facility], [address] [place], [place] [address]  Orders Only on 09/17/2021  Component Date Value Ref Range Status   Course ID 09/17/2021 [id number]   Final   Course Intent 09/17/2021 Curative   Final   Course Start Date 09/17/2021 08/22/2021   Final   Session Number 09/17/2021 11   Final   Course First Treatment Date 09/17/2021 09/03/2021  2:33 PM   Final   Course Last Treatment Date 09/17/2021 09/17/2021  2:30 PM    Final   Course Elapsed Days 09/17/2021 14   Final   Reference Point ID 09/17/2021 Lt Cheek DP   Final   Reference Point Dosage Given to Da* 09/17/2021 22  Gy Final   Reference Point Session Dosage Giv* 09/17/2021 2  Gy Final   Plan ID 09/17/2021 [email or web address]   Final   Plan Name 09/17/2021 HN_Lt   Final   Plan Fractions Treated to Date 09/17/2021 11   Final   Plan Total Fractions Prescribed 09/17/2021 25   Final   Plan Prescribed Dose Per Fraction 09/17/2021 2  Gy Final   Plan Total Prescribed Dose 09/17/2021 50.000000  Gy Final   Plan Primary Reference Point 09/17/2021 Lt Cheek DP   Final   CMP     Component Value Date/Time   NA 136 09/17/2021 1503   NA 134 06/18/2021 1514   K 4.9 09/17/2021 1503   CL 101 09/17/2021 1503   CO2 26 09/17/2021 1503   GLUCOSE 152 (H) 09/17/2021 1503   BUN 39 (H) 09/17/2021 1503   BUN 19 06/18/2021 1514   CREATININE 1.33 (H) 09/17/2021 1503   CALCIUM 8.9 09/17/2021 1503   PROT 7.0 09/17/2021 1503   PROT 5.8 (L) 06/18/2021 1514  ALBUMIN 3.9 09/17/2021 1503   ALBUMIN 3.6 06/18/2021 1514   AST 17 09/17/2021 1503   ALT 11 09/17/2021 1503   ALKPHOS 101 09/17/2021 1503   BILITOT 0.4 09/17/2021 1503   BILITOT 0.3 06/18/2021 1514   GFRNONAA 38 (L) 09/17/2021 1503   GFRAA 49 (L) 12/27/2019 0935    Assessment and Plan.  1. Myelofibrosis ([place])    # Polycythemia vera and  secondary myelofibrosis. Patient has decreased functionality, multiple medical problems, and she is at a risk of developing complication while on antineoplasm treatments. Labs reviewed and discussed Platelet count is at 922,000.  Could be secondary to myelofibrosis/P vera, there might been a component of acute inflammation secondary to recent dermatology procedure. Patient progress Jakafi 5 mg daily.  I recommend to increase to 5 mg twice daily Continue aspirin 81 mg daily.  .# Anemia due to CKD, and myelofibrosis.   Hemoglobin has improved.  No need for Retacrit.  #Bilateral  lower extremity edema, right worse than left.   No DVT on recent bilateral lower extremity ultrasound.  Recommend leg elevation and compression stocking.  #Squamous cell carcinoma of the skin.  Her dermatologist has recommended patient to see radiation oncologist.  Offered patient for home health for medication management.  Daughter and patient preferred to defer home health at this point.  Follow up in 4 weeks.   Earlie Server, MD, PhD 09/18/2021

## 2021-09-19 ENCOUNTER — Other Ambulatory Visit: Payer: Self-pay

## 2021-09-19 ENCOUNTER — Ambulatory Visit
Admission: RE | Admit: 2021-09-19 | Discharge: 2021-09-19 | Disposition: A | Discharge status: Home / Self Care | Payer: Medicare Other | Source: Ambulatory Visit | Attending: Radiation Oncology | Admitting: Radiation Oncology

## 2021-09-19 DIAGNOSIS — C44329 Squamous cell carcinoma of skin of other parts of face: Secondary | ICD-10-CM | POA: Diagnosis not present

## 2021-09-19 LAB — RAD ONC ARIA SESSION SUMMARY
Course Elapsed Days: 16
Plan Fractions Treated to Date: 13
Plan Prescribed Dose Per Fraction: 2 Gy
Plan Total Fractions Prescribed: 25
Plan Total Prescribed Dose: 50 Gy
Reference Point Dosage Given to Date: 26 Gy
Reference Point Session Dosage Given: 2 Gy
Session Number: 13

## 2021-09-20 ENCOUNTER — Other Ambulatory Visit: Payer: Self-pay

## 2021-09-20 ENCOUNTER — Ambulatory Visit
Admission: RE | Admit: 2021-09-20 | Discharge: 2021-09-20 | Disposition: A | Discharge status: Home / Self Care | Payer: Medicare Other | Source: Ambulatory Visit | Attending: Radiation Oncology | Admitting: Radiation Oncology

## 2021-09-20 DIAGNOSIS — R262 Difficulty in walking, not elsewhere classified: Secondary | ICD-10-CM | POA: Diagnosis not present

## 2021-09-20 DIAGNOSIS — G459 Transient cerebral ischemic attack, unspecified: Secondary | ICD-10-CM | POA: Diagnosis not present

## 2021-09-20 DIAGNOSIS — R2681 Unsteadiness on feet: Secondary | ICD-10-CM | POA: Diagnosis not present

## 2021-09-20 DIAGNOSIS — C44329 Squamous cell carcinoma of skin of other parts of face: Secondary | ICD-10-CM | POA: Diagnosis not present

## 2021-09-20 DIAGNOSIS — M6281 Muscle weakness (generalized): Secondary | ICD-10-CM | POA: Diagnosis not present

## 2021-09-20 DIAGNOSIS — F02C0 Dementia in other diseases classified elsewhere, severe, without behavioral disturbance, psychotic disturbance, mood disturbance, and anxiety: Secondary | ICD-10-CM | POA: Diagnosis not present

## 2021-09-20 DIAGNOSIS — N1832 Chronic kidney disease, stage 3b: Secondary | ICD-10-CM | POA: Diagnosis not present

## 2021-09-20 LAB — RAD ONC ARIA SESSION SUMMARY
Course Elapsed Days: 17
Plan Fractions Treated to Date: 14
Plan Prescribed Dose Per Fraction: 2 Gy
Plan Total Fractions Prescribed: 25
Plan Total Prescribed Dose: 50 Gy
Reference Point Dosage Given to Date: 28 Gy
Reference Point Session Dosage Given: 2 Gy
Session Number: 14

## 2021-09-21 ENCOUNTER — Other Ambulatory Visit: Payer: Self-pay

## 2021-09-21 ENCOUNTER — Other Ambulatory Visit: Payer: Self-pay | Admitting: Oncology

## 2021-09-21 ENCOUNTER — Telehealth: Payer: Self-pay

## 2021-09-21 ENCOUNTER — Ambulatory Visit
Admission: RE | Admit: 2021-09-21 | Discharge: 2021-09-21 | Disposition: A | Discharge status: Home / Self Care | Payer: Medicare Other | Source: Ambulatory Visit | Attending: Radiation Oncology | Admitting: Radiation Oncology

## 2021-09-21 ENCOUNTER — Other Ambulatory Visit (HOSPITAL_COMMUNITY): Payer: Self-pay

## 2021-09-21 DIAGNOSIS — C44329 Squamous cell carcinoma of skin of other parts of face: Secondary | ICD-10-CM | POA: Diagnosis not present

## 2021-09-21 DIAGNOSIS — Z5111 Encounter for antineoplastic chemotherapy: Secondary | ICD-10-CM | POA: Insufficient documentation

## 2021-09-21 DIAGNOSIS — D45 Polycythemia vera: Secondary | ICD-10-CM

## 2021-09-21 DIAGNOSIS — N1832 Chronic kidney disease, stage 3b: Secondary | ICD-10-CM

## 2021-09-21 LAB — RAD ONC ARIA SESSION SUMMARY
Course Elapsed Days: 18
Plan Fractions Treated to Date: 15
Plan Prescribed Dose Per Fraction: 2 Gy
Plan Total Fractions Prescribed: 25
Plan Total Prescribed Dose: 50 Gy
Reference Point Dosage Given to Date: 30 Gy
Reference Point Session Dosage Given: 2 Gy
Session Number: 15

## 2021-09-21 MED ORDER — RUXOLITINIB PHOSPHATE 5 MG PO TABS
5.0000 mg | ORAL_TABLET | ORAL | 0 refills | Status: DC
  Filled 2021-09-21: qty 90, fill #0
  Filled 2021-09-21: qty 90, 30d supply, fill #0

## 2021-09-24 ENCOUNTER — Emergency Department: Payer: Medicare Other

## 2021-09-24 ENCOUNTER — Ambulatory Visit: Payer: Medicare Other

## 2021-09-24 ENCOUNTER — Observation Stay
Admission: EM | Admit: 2021-09-24 | Discharge: 2021-09-25 | Disposition: A | Discharge status: Home / Self Care | Payer: Medicare Other | Attending: Obstetrics and Gynecology | Admitting: Obstetrics and Gynecology

## 2021-09-24 ENCOUNTER — Other Ambulatory Visit: Payer: Self-pay

## 2021-09-24 DIAGNOSIS — C44329 Squamous cell carcinoma of skin of other parts of face: Secondary | ICD-10-CM | POA: Insufficient documentation

## 2021-09-24 DIAGNOSIS — G459 Transient cerebral ischemic attack, unspecified: Secondary | ICD-10-CM | POA: Diagnosis not present

## 2021-09-24 DIAGNOSIS — M6281 Muscle weakness (generalized): Secondary | ICD-10-CM | POA: Insufficient documentation

## 2021-09-24 DIAGNOSIS — R9431 Abnormal electrocardiogram [ECG] [EKG]: Secondary | ICD-10-CM | POA: Diagnosis not present

## 2021-09-24 DIAGNOSIS — E440 Moderate protein-calorie malnutrition: Secondary | ICD-10-CM | POA: Insufficient documentation

## 2021-09-24 DIAGNOSIS — Z9181 History of falling: Secondary | ICD-10-CM | POA: Insufficient documentation

## 2021-09-24 DIAGNOSIS — Z79899 Other long term (current) drug therapy: Secondary | ICD-10-CM | POA: Insufficient documentation

## 2021-09-24 DIAGNOSIS — G238 Other specified degenerative diseases of basal ganglia: Secondary | ICD-10-CM | POA: Diagnosis not present

## 2021-09-24 DIAGNOSIS — S81811A Laceration without foreign body, right lower leg, initial encounter: Principal | ICD-10-CM | POA: Diagnosis present

## 2021-09-24 DIAGNOSIS — F0392 Unspecified dementia, unspecified severity, with psychotic disturbance: Secondary | ICD-10-CM | POA: Diagnosis present

## 2021-09-24 DIAGNOSIS — N1832 Chronic kidney disease, stage 3b: Secondary | ICD-10-CM | POA: Diagnosis not present

## 2021-09-24 DIAGNOSIS — F32A Depression, unspecified: Secondary | ICD-10-CM | POA: Diagnosis present

## 2021-09-24 DIAGNOSIS — R2681 Unsteadiness on feet: Secondary | ICD-10-CM | POA: Insufficient documentation

## 2021-09-24 DIAGNOSIS — M47812 Spondylosis without myelopathy or radiculopathy, cervical region: Secondary | ICD-10-CM | POA: Diagnosis not present

## 2021-09-24 DIAGNOSIS — D7581 Myelofibrosis: Secondary | ICD-10-CM | POA: Diagnosis present

## 2021-09-24 DIAGNOSIS — F02C0 Dementia in other diseases classified elsewhere, severe, without behavioral disturbance, psychotic disturbance, mood disturbance, and anxiety: Secondary | ICD-10-CM | POA: Diagnosis not present

## 2021-09-24 DIAGNOSIS — F0390 Unspecified dementia without behavioral disturbance: Secondary | ICD-10-CM | POA: Diagnosis not present

## 2021-09-24 DIAGNOSIS — I700 Atherosclerosis of aorta: Secondary | ICD-10-CM | POA: Insufficient documentation

## 2021-09-24 DIAGNOSIS — Z7982 Long term (current) use of aspirin: Secondary | ICD-10-CM | POA: Diagnosis not present

## 2021-09-24 DIAGNOSIS — M7989 Other specified soft tissue disorders: Secondary | ICD-10-CM | POA: Diagnosis not present

## 2021-09-24 DIAGNOSIS — S0990XA Unspecified injury of head, initial encounter: Secondary | ICD-10-CM | POA: Diagnosis not present

## 2021-09-24 DIAGNOSIS — N189 Chronic kidney disease, unspecified: Secondary | ICD-10-CM | POA: Diagnosis present

## 2021-09-24 DIAGNOSIS — I739 Peripheral vascular disease, unspecified: Secondary | ICD-10-CM | POA: Diagnosis not present

## 2021-09-24 DIAGNOSIS — K449 Diaphragmatic hernia without obstruction or gangrene: Secondary | ICD-10-CM | POA: Diagnosis not present

## 2021-09-24 DIAGNOSIS — A419 Sepsis, unspecified organism: Secondary | ICD-10-CM | POA: Diagnosis not present

## 2021-09-24 DIAGNOSIS — R262 Difficulty in walking, not elsewhere classified: Secondary | ICD-10-CM | POA: Diagnosis not present

## 2021-09-24 DIAGNOSIS — I959 Hypotension, unspecified: Secondary | ICD-10-CM | POA: Diagnosis not present

## 2021-09-24 DIAGNOSIS — D631 Anemia in chronic kidney disease: Secondary | ICD-10-CM | POA: Diagnosis not present

## 2021-09-24 DIAGNOSIS — Z23 Encounter for immunization: Secondary | ICD-10-CM | POA: Insufficient documentation

## 2021-09-24 DIAGNOSIS — G9389 Other specified disorders of brain: Secondary | ICD-10-CM | POA: Diagnosis not present

## 2021-09-24 DIAGNOSIS — S81801A Unspecified open wound, right lower leg, initial encounter: Secondary | ICD-10-CM | POA: Diagnosis present

## 2021-09-24 DIAGNOSIS — S99911A Unspecified injury of right ankle, initial encounter: Secondary | ICD-10-CM | POA: Diagnosis not present

## 2021-09-24 DIAGNOSIS — D45 Polycythemia vera: Secondary | ICD-10-CM | POA: Diagnosis present

## 2021-09-24 LAB — CBC WITH DIFFERENTIAL/PLATELET
Abs Immature Granulocytes: 0.71 10*3/uL — ABNORMAL HIGH (ref 0.00–0.07)
Basophils Absolute: 0.2 10*3/uL — ABNORMAL HIGH (ref 0.0–0.1)
Basophils Relative: 2 %
Eosinophils Absolute: 0.7 10*3/uL — ABNORMAL HIGH (ref 0.0–0.5)
Eosinophils Relative: 6 %
HCT: 33.3 % — ABNORMAL LOW (ref 36.0–46.0)
Hemoglobin: 9.9 g/dL — ABNORMAL LOW (ref 12.0–15.0)
Immature Granulocytes: 6 %
Lymphocytes Relative: 18 %
Lymphs Abs: 2.2 10*3/uL (ref 0.7–4.0)
MCH: 27.3 pg (ref 26.0–34.0)
MCHC: 29.7 g/dL — ABNORMAL LOW (ref 30.0–36.0)
MCV: 92 fL (ref 80.0–100.0)
Monocytes Absolute: 1.2 10*3/uL — ABNORMAL HIGH (ref 0.1–1.0)
Monocytes Relative: 9 %
Neutro Abs: 7.3 10*3/uL (ref 1.7–7.7)
Neutrophils Relative %: 59 %
Platelets: 796 10*3/uL — ABNORMAL HIGH (ref 150–400)
RBC: 3.62 MIL/uL — ABNORMAL LOW (ref 3.87–5.11)
RDW: 22.6 % — ABNORMAL HIGH (ref 11.5–15.5)
Smear Review: NORMAL
WBC: 12.4 10*3/uL — ABNORMAL HIGH (ref 4.0–10.5)
nRBC: 0 % (ref 0.0–0.2)

## 2021-09-24 LAB — COMPREHENSIVE METABOLIC PANEL
ALT: 11 U/L (ref 0–44)
AST: 21 U/L (ref 15–41)
Albumin: 3.5 g/dL (ref 3.5–5.0)
Alkaline Phosphatase: 88 U/L (ref 38–126)
Anion gap: 8 (ref 5–15)
BUN: 36 mg/dL — ABNORMAL HIGH (ref 8–23)
CO2: 21 mmol/L — ABNORMAL LOW (ref 22–32)
Calcium: 8.6 mg/dL — ABNORMAL LOW (ref 8.9–10.3)
Chloride: 107 mmol/L (ref 98–111)
Creatinine, Ser: 1.43 mg/dL — ABNORMAL HIGH (ref 0.44–1.00)
GFR, Estimated: 34 mL/min — ABNORMAL LOW (ref >60)
Glucose, Bld: 134 mg/dL — ABNORMAL HIGH (ref 70–99)
Potassium: 4.6 mmol/L (ref 3.5–5.1)
Sodium: 136 mmol/L (ref 135–145)
Total Bilirubin: 0.2 mg/dL — ABNORMAL LOW (ref 0.3–1.2)
Total Protein: 6.3 g/dL — ABNORMAL LOW (ref 6.5–8.1)

## 2021-09-24 LAB — URINALYSIS, ROUTINE W REFLEX MICROSCOPIC
Bilirubin Urine: NEGATIVE
Glucose, UA: NEGATIVE mg/dL
Hgb urine dipstick: NEGATIVE
Ketones, ur: NEGATIVE mg/dL
Leukocytes,Ua: NEGATIVE
Nitrite: NEGATIVE
Protein, ur: NEGATIVE mg/dL
Specific Gravity, Urine: 1.008 (ref 1.005–1.030)
pH: 5 (ref 5.0–8.0)

## 2021-09-24 LAB — LACTIC ACID, PLASMA
Lactic Acid, Venous: 2 mmol/L (ref 0.5–1.9)
Lactic Acid, Venous: 2.6 mmol/L (ref 0.5–1.9)

## 2021-09-24 LAB — TROPONIN I (HIGH SENSITIVITY): Troponin I (High Sensitivity): 6 ng/L (ref <18)

## 2021-09-24 MED ORDER — SODIUM CHLORIDE 0.9 % IV BOLUS
1000.0000 mL | Freq: Once | INTRAVENOUS | Status: AC
  Administered 2021-09-24: 1000 mL via INTRAVENOUS

## 2021-09-24 MED ORDER — SODIUM CHLORIDE 0.9 % IV SOLN: INTRAVENOUS | Status: DC

## 2021-09-24 MED ORDER — RUXOLITINIB PHOSPHATE 5 MG PO TABS: 5.0000 mg | ORAL_TABLET | ORAL | Status: DC

## 2021-09-24 MED ORDER — HYDROCODONE-ACETAMINOPHEN 5-325 MG PO TABS: 1.0000 | ORAL_TABLET | ORAL | Status: DC | PRN

## 2021-09-24 MED ORDER — ACETAMINOPHEN 500 MG PO TABS: 1000.0000 mg | ORAL_TABLET | Freq: Once | ORAL | Status: AC

## 2021-09-24 MED ORDER — ASPIRIN 81 MG PO TBEC
81.0000 mg | DELAYED_RELEASE_TABLET | Freq: Every day | ORAL | Status: DC
  Administered 2021-09-25: 81 mg via ORAL
  Filled 2021-09-24: qty 1

## 2021-09-24 MED ORDER — LIDOCAINE-EPINEPHRINE 2 %-1:100000 IJ SOLN
20.0000 mL | Freq: Once | INTRAMUSCULAR | Status: AC
  Administered 2021-09-24: 20 mL via INTRADERMAL

## 2021-09-24 MED ORDER — MORPHINE SULFATE (PF) 2 MG/ML IV SOLN: 2.0000 mg | INTRAVENOUS | Status: DC | PRN

## 2021-09-24 MED ORDER — CITALOPRAM HYDROBROMIDE 10 MG PO TABS
10.0000 mg | ORAL_TABLET | Freq: Every day | ORAL | Status: DC
  Administered 2021-09-25: 10 mg via ORAL
  Filled 2021-09-24: qty 1

## 2021-09-24 MED ORDER — ACETAMINOPHEN 325 MG PO TABS: 650.0000 mg | ORAL_TABLET | Freq: Four times a day (QID) | ORAL | Status: DC | PRN

## 2021-09-24 MED ORDER — CEFAZOLIN SODIUM-DEXTROSE 1-4 GM/50ML-% IV SOLN
1.0000 g | Freq: Two times a day (BID) | INTRAVENOUS | Status: DC
  Administered 2021-09-25 (×2): 1 g via INTRAVENOUS
  Filled 2021-09-24 (×3): qty 50

## 2021-09-24 MED ORDER — ACETAMINOPHEN 500 MG PO TABS
ORAL_TABLET | ORAL | Status: AC
  Administered 2021-09-24: 1000 mg via ORAL
  Filled 2021-09-24: qty 2

## 2021-09-24 MED ORDER — HEPARIN SODIUM (PORCINE) 5000 UNIT/ML IJ SOLN
5000.0000 [IU] | Freq: Two times a day (BID) | INTRAMUSCULAR | Status: DC
  Administered 2021-09-25 (×2): 5000 [IU] via SUBCUTANEOUS
  Filled 2021-09-24 (×2): qty 1

## 2021-09-24 MED ORDER — HYDROCODONE-ACETAMINOPHEN 5-325 MG PO TABS: 1.0000 | ORAL_TABLET | Freq: Three times a day (TID) | ORAL | Status: DC | PRN

## 2021-09-24 MED ORDER — SODIUM CHLORIDE 0.9% FLUSH
3.0000 mL | Freq: Two times a day (BID) | INTRAVENOUS | Status: DC
  Administered 2021-09-25: 3 mL via INTRAVENOUS

## 2021-09-24 MED ORDER — TETANUS-DIPHTH-ACELL PERTUSSIS 5-2.5-18.5 LF-MCG/0.5 IM SUSY
0.5000 mL | PREFILLED_SYRINGE | Freq: Once | INTRAMUSCULAR | Status: AC
  Administered 2021-09-24: 0.5 mL via INTRAMUSCULAR
  Filled 2021-09-24: qty 0.5

## 2021-09-24 MED ORDER — ACETAMINOPHEN 650 MG RE SUPP: 650.0000 mg | Freq: Four times a day (QID) | RECTAL | Status: DC | PRN

## 2021-09-24 NOTE — Assessment & Plan Note (Addendum)
Blood pressure (!) 106/93, pulse 85, temperature 97.7 F (36.5 C), resp. rate (!) 21, height 5' (1.524 m), weight 56.8 kg, SpO2 97 %. we will continue with MIVF. Ancef per ortho recommendation.  Will repeat lactic acid. Will follow c/s.

## 2021-09-24 NOTE — Assessment & Plan Note (Signed)
Asa 81 mg continued along with Jakafi as pts high risk of VTE and stroke from extremely high platelet count.

## 2021-09-25 ENCOUNTER — Telehealth: Payer: Self-pay | Admitting: Pharmacy Technician

## 2021-09-25 ENCOUNTER — Ambulatory Visit: Payer: Medicare Other

## 2021-09-25 ENCOUNTER — Other Ambulatory Visit (HOSPITAL_COMMUNITY): Payer: Self-pay

## 2021-09-25 DIAGNOSIS — S81811A Laceration without foreign body, right lower leg, initial encounter: Secondary | ICD-10-CM | POA: Diagnosis not present

## 2021-09-25 LAB — PREPARE RBC (CROSSMATCH)

## 2021-09-25 LAB — COMPREHENSIVE METABOLIC PANEL
ALT: 10 U/L (ref 0–44)
AST: 14 U/L — ABNORMAL LOW (ref 15–41)
Albumin: 2.9 g/dL — ABNORMAL LOW (ref 3.5–5.0)
Alkaline Phosphatase: 73 U/L (ref 38–126)
Anion gap: 2 — ABNORMAL LOW (ref 5–15)
BUN: 23 mg/dL (ref 8–23)
CO2: 24 mmol/L (ref 22–32)
Calcium: 7.9 mg/dL — ABNORMAL LOW (ref 8.9–10.3)
Chloride: 115 mmol/L — ABNORMAL HIGH (ref 98–111)
Creatinine, Ser: 0.84 mg/dL (ref 0.44–1.00)
GFR, Estimated: >60 mL/min (ref >60)
Glucose, Bld: 95 mg/dL (ref 70–99)
Potassium: 4.4 mmol/L (ref 3.5–5.1)
Sodium: 141 mmol/L (ref 135–145)
Total Bilirubin: 0.3 mg/dL (ref 0.3–1.2)
Total Protein: 4.9 g/dL — ABNORMAL LOW (ref 6.5–8.1)

## 2021-09-25 LAB — CBC
HCT: 25.1 % — ABNORMAL LOW (ref 36.0–46.0)
Hemoglobin: 7.5 g/dL — ABNORMAL LOW (ref 12.0–15.0)
MCH: 27.5 pg (ref 26.0–34.0)
MCHC: 29.9 g/dL — ABNORMAL LOW (ref 30.0–36.0)
MCV: 91.9 fL (ref 80.0–100.0)
Platelets: 628 10*3/uL — ABNORMAL HIGH (ref 150–400)
RBC: 2.73 MIL/uL — ABNORMAL LOW (ref 3.87–5.11)
RDW: 22.1 % — ABNORMAL HIGH (ref 11.5–15.5)
WBC: 12.5 10*3/uL — ABNORMAL HIGH (ref 4.0–10.5)
nRBC: 0 % (ref 0.0–0.2)

## 2021-09-25 LAB — LACTIC ACID, PLASMA: Lactic Acid, Venous: 2 mmol/L (ref 0.5–1.9)

## 2021-09-25 MED ORDER — SODIUM CHLORIDE 0.9% IV SOLUTION
Freq: Once | INTRAVENOUS | Status: AC
Start: 2021-09-25 — End: 2021-09-25

## 2021-09-25 MED ORDER — CEPHALEXIN 500 MG PO CAPS: 500.0000 mg | ORAL_CAPSULE | Freq: Four times a day (QID) | ORAL | 0 refills | Status: AC

## 2021-09-25 MED ORDER — LORAZEPAM 0.5 MG PO TABS: 0.2500 mg | ORAL_TABLET | Freq: Once | ORAL | Status: DC

## 2021-09-25 MED ORDER — ORAL CARE MOUTH RINSE: 15.0000 mL | OROMUCOSAL | Status: DC | PRN

## 2021-09-25 MED ORDER — CEPHALEXIN 500 MG PO CAPS
500.0000 mg | ORAL_CAPSULE | Freq: Four times a day (QID) | ORAL | 0 refills | Status: DC
Start: 2021-09-25 — End: 2021-09-25

## 2021-09-25 MED ORDER — CEFAZOLIN SODIUM-DEXTROSE 1-4 GM/50ML-% IV SOLN: 1.0000 g | Freq: Three times a day (TID) | INTRAVENOUS | Status: DC

## 2021-09-25 NOTE — Evaluation (Addendum)
Occupational Therapy Evaluation Patient Details Name: Laura Wilcox MRN: [id number] DOB: 1927-01-06 Today's Date: 09/25/2021   History of Present Illness Laura Wilcox is a 88 y.o. female with medical history significant of polycythemia and secondary myelofibrosis, allergies to sulfa and epinephrine.  Patient coming from a memory care facility. Patient was getting onto bus from her facility for radiation therapy for squamous cell cancer of her face patient then had an unwitnessed fall resulting in skin tear.   Clinical Impression   Ms Meckes was seen for OT evaluation this date. Prior to hospital admission, pt was resident at [facility] unit, assist for bathing only. Pt presents at baseline level of independence. Upon arrival pt completing PT and daughter arriving to room. Pt agitated and requires cueing to redirect. SUPERVISION + RW for ADL t/f, cues for navigation, tolerates ~100 ft. Pt refuses to doff socks and refuses assistance (RLE pain) however demonstrates appropriate ROM to doff independently. Upon hospital discharge, no OT follow up needed.     Recommendations for follow up therapy are one component of a multi-disciplinary discharge planning process, led by the attending physician.  Recommendations may be updated based on patient status, additional functional criteria and insurance authorization.   Follow Up Recommendations  No OT follow up    Assistance Recommended at Discharge Frequent or constant Supervision/Assistance  Patient can return home with the following A little help with bathing/dressing/bathroom;Help with stairs or ramp for entrance    Functional Status Assessment  Patient has not had a recent decline in their functional status  Equipment Recommendations  None recommended by OT    Recommendations for Other Services       Precautions / Restrictions Precautions Precautions: Fall Restrictions Weight Bearing Restrictions: Yes RLE Weight Bearing:  Weight bearing as tolerated      Mobility Bed Mobility Overal bed mobility: Modified Independent             General bed mobility comments: HOB elevated    Transfers Overall transfer level: Modified independent Equipment used: Rolling walker (2 wheels)                      Balance Overall balance assessment: No apparent balance deficits (not formally assessed)                                         ADL either performed or assessed with clinical judgement   ADL Overall ADL's : Needs assistance/impaired                                       General ADL Comments: SUPERVISION + RW for ADL t/f, cues for navigation. Pt refuses to doff socks and refuses assistance (RLE pain) however demonstrates appropriate ROM to doff independently.      Pertinent Vitals/Pain Pain Assessment Pain Assessment: Faces Faces Pain Scale: Hurts little more Pain Location: RLE to touch however denies pain when asked Pain Descriptors / Indicators: Grimacing Pain Intervention(s): Limited activity within patient's tolerance, Repositioned     Hand Dominance     Extremity/Trunk Assessment Upper Extremity Assessment Upper Extremity Assessment: Overall WFL for tasks assessed   Lower Extremity Assessment Lower Extremity Assessment: Generalized weakness       Communication Communication Communication: HOH   Cognition Arousal/Alertness: Awake/alert Behavior During Therapy: Agitated,  Restless Overall Cognitive Status: History of cognitive impairments - at baseline                                                  Home Living Family/patient expects to be discharged to:: Assisted living                                 Additional Comments: [facility] memory care unit      Prior Functioning/Environment Prior Level of Function : Needs assist             Mobility Comments: MOD I using RW PRN in last few  months ADLs Comments: SUPERVISION for bathing at facility        OT Problem List: Decreased range of motion;Decreased cognition;Decreased activity tolerance         OT Goals(Current goals can be found in the care plan section) Acute Rehab OT Goals Patient Stated Goal: to go home OT Goal Formulation: With patient/family Time For Goal Achievement: 10/09/21 Potential to Achieve Goals: Good   AM-PAC OT "6 Clicks" Daily Activity     Outcome Measure Help from another person eating meals?: None Help from another person taking care of personal grooming?: None Help from another person toileting, which includes using toliet, bedpan, or urinal?: None Help from another person bathing (including washing, rinsing, drying)?: A Little Help from another person to put on and taking off regular upper body clothing?: None Help from another person to put on and taking off regular lower body clothing?: A Little 6 Click Score: 22   End of Session Equipment Utilized During Treatment: Rolling walker (2 wheels)  Activity Tolerance: Patient tolerated treatment well Patient left: in bed;with call bell/phone within reach;with bed alarm set;with family/visitor present  OT Visit Diagnosis: Unsteadiness on feet (R26.81)                Time: 1415-1430 OT Time Calculation (min): 15 min Charges:  OT General Charges $OT Visit: 1 Visit OT Evaluation $OT Eval Low Complexity: 1 Low  Kathie Dike, M.S. OTR/L  09/25/21, 3:04 PM  ascom [phone]

## 2021-09-25 NOTE — TOC Progression Note (Signed)
Transition of Care [facility]) - Progression Note    Patient Details  Name: Laura Wilcox MRN: [id number] Date of Birth: 04/17/1926  Transition of [facility] CM/SW Contact  Maree Krabbe, LCSW Phone Number: 09/25/2021, 4:18 PM  Clinical Narrative:   Pt is a resident at [facility]. CSW spoke with [facility] whom states they can take her back today. Silvio Pate was requesting wound care orders however MD states there is no wound care, it is follow up in week. Shelia notified. FL2 and dc summary faxed to [facility]. Per RN family will transport pt. MD to send meds to [facility] or [facility].         Expected Discharge Plan and Services           Expected Discharge Date: 09/25/21                                     Social Determinants of Health (SDOH) Interventions    Readmission Risk Interventions    06/26/2021    4:26 PM  Readmission Risk Prevention Plan  Transportation Screening Complete  PCP or Specialist Appt within 5-7 Days Complete  Home Care Screening Complete  Medication Review (RN CM) Complete

## 2021-09-25 NOTE — Progress Notes (Signed)
DC order received. AVS instructions given to patient and daughter. All DC instructions and medications reviewed and questions answered. IV removed and gauze dressing placed with no complications.

## 2021-09-25 NOTE — Telephone Encounter (Signed)
Oral Oncology Patient Advocate Encounter   Received notification from [facility] that prior authorization for Laura Wilcox is required.   PA submitted on CoverMyMeds Key [id number] Status is pending   Oral Oncology Clinic will continue to follow.  Daine Floras CPHT Specialty Pharmacy Patient Advocate [facility] Phone [phone] Fax [phone] 09/25/2021 10:51 AM

## 2021-09-25 NOTE — Plan of Care (Signed)

## 2021-09-26 ENCOUNTER — Ambulatory Visit
Admission: RE | Admit: 2021-09-26 | Discharge: 2021-09-26 | Disposition: A | Discharge status: Home / Self Care | Payer: Medicare Other | Source: Ambulatory Visit | Attending: Radiation Oncology | Admitting: Radiation Oncology

## 2021-09-26 ENCOUNTER — Other Ambulatory Visit: Payer: Self-pay

## 2021-09-26 DIAGNOSIS — G459 Transient cerebral ischemic attack, unspecified: Secondary | ICD-10-CM | POA: Diagnosis not present

## 2021-09-26 DIAGNOSIS — N1832 Chronic kidney disease, stage 3b: Secondary | ICD-10-CM | POA: Diagnosis not present

## 2021-09-26 DIAGNOSIS — C060 Malignant neoplasm of cheek mucosa: Secondary | ICD-10-CM | POA: Diagnosis not present

## 2021-09-26 DIAGNOSIS — R262 Difficulty in walking, not elsewhere classified: Secondary | ICD-10-CM | POA: Diagnosis not present

## 2021-09-26 DIAGNOSIS — C44329 Squamous cell carcinoma of skin of other parts of face: Secondary | ICD-10-CM | POA: Diagnosis not present

## 2021-09-26 DIAGNOSIS — R2681 Unsteadiness on feet: Secondary | ICD-10-CM | POA: Diagnosis not present

## 2021-09-26 DIAGNOSIS — M6281 Muscle weakness (generalized): Secondary | ICD-10-CM | POA: Diagnosis not present

## 2021-09-26 DIAGNOSIS — F02C0 Dementia in other diseases classified elsewhere, severe, without behavioral disturbance, psychotic disturbance, mood disturbance, and anxiety: Secondary | ICD-10-CM | POA: Diagnosis not present

## 2021-09-26 LAB — TYPE AND SCREEN
ABO/RH(D): O POS
Antibody Screen: NEGATIVE
Unit division: 0

## 2021-09-26 LAB — RAD ONC ARIA SESSION SUMMARY
Course Elapsed Days: 23
Plan Fractions Treated to Date: 16
Plan Prescribed Dose Per Fraction: 2 Gy
Plan Total Fractions Prescribed: 25
Plan Total Prescribed Dose: 50 Gy
Reference Point Dosage Given to Date: 32 Gy
Reference Point Session Dosage Given: 2 Gy
Session Number: 16

## 2021-09-26 LAB — BPAM RBC
Blood Product Expiration Date: 202307052359
ISSUE DATE / TIME: 202306270854
Unit Type and Rh: 9500

## 2021-09-26 LAB — HIGH SENSITIVITY CRP: CRP, High Sensitivity: 0.52 mg/L (ref 0.00–3.00)

## 2021-09-27 ENCOUNTER — Ambulatory Visit
Admission: RE | Admit: 2021-09-27 | Discharge: 2021-09-27 | Disposition: A | Discharge status: Home / Self Care | Payer: Medicare Other | Source: Ambulatory Visit | Attending: Radiation Oncology | Admitting: Radiation Oncology

## 2021-09-27 ENCOUNTER — Other Ambulatory Visit: Payer: Self-pay

## 2021-09-27 DIAGNOSIS — N1832 Chronic kidney disease, stage 3b: Secondary | ICD-10-CM | POA: Diagnosis not present

## 2021-09-27 DIAGNOSIS — C44329 Squamous cell carcinoma of skin of other parts of face: Secondary | ICD-10-CM | POA: Diagnosis not present

## 2021-09-27 DIAGNOSIS — F02C0 Dementia in other diseases classified elsewhere, severe, without behavioral disturbance, psychotic disturbance, mood disturbance, and anxiety: Secondary | ICD-10-CM | POA: Diagnosis not present

## 2021-09-27 DIAGNOSIS — G459 Transient cerebral ischemic attack, unspecified: Secondary | ICD-10-CM | POA: Diagnosis not present

## 2021-09-27 DIAGNOSIS — R262 Difficulty in walking, not elsewhere classified: Secondary | ICD-10-CM | POA: Diagnosis not present

## 2021-09-27 DIAGNOSIS — R2681 Unsteadiness on feet: Secondary | ICD-10-CM | POA: Diagnosis not present

## 2021-09-27 DIAGNOSIS — M6281 Muscle weakness (generalized): Secondary | ICD-10-CM | POA: Diagnosis not present

## 2021-09-27 DIAGNOSIS — N3000 Acute cystitis without hematuria: Secondary | ICD-10-CM | POA: Diagnosis not present

## 2021-09-27 DIAGNOSIS — S81811D Laceration without foreign body, right lower leg, subsequent encounter: Secondary | ICD-10-CM | POA: Diagnosis not present

## 2021-09-27 DIAGNOSIS — W010XXA Fall on same level from slipping, tripping and stumbling without subsequent striking against object, initial encounter: Secondary | ICD-10-CM | POA: Diagnosis not present

## 2021-09-27 LAB — URINE CULTURE: Culture: >=100000 — AB

## 2021-09-27 LAB — RAD ONC ARIA SESSION SUMMARY
Course Elapsed Days: 24
Plan Fractions Treated to Date: 17
Plan Prescribed Dose Per Fraction: 2 Gy
Plan Total Fractions Prescribed: 25
Plan Total Prescribed Dose: 50 Gy
Reference Point Dosage Given to Date: 34 Gy
Reference Point Session Dosage Given: 2 Gy
Session Number: 17

## 2021-09-28 ENCOUNTER — Ambulatory Visit
Admission: RE | Admit: 2021-09-28 | Discharge: 2021-09-28 | Disposition: A | Discharge status: Home / Self Care | Payer: Medicare Other | Source: Ambulatory Visit | Attending: Radiation Oncology | Admitting: Radiation Oncology

## 2021-09-28 ENCOUNTER — Other Ambulatory Visit: Payer: Self-pay

## 2021-09-28 DIAGNOSIS — C44329 Squamous cell carcinoma of skin of other parts of face: Secondary | ICD-10-CM | POA: Diagnosis not present

## 2021-09-28 LAB — RAD ONC ARIA SESSION SUMMARY
Course Elapsed Days: 25
Plan Fractions Treated to Date: 18
Plan Prescribed Dose Per Fraction: 2 Gy
Plan Total Fractions Prescribed: 25
Plan Total Prescribed Dose: 50 Gy
Reference Point Dosage Given to Date: 36 Gy
Reference Point Session Dosage Given: 2 Gy
Session Number: 18

## 2021-09-29 LAB — CULTURE, BLOOD (ROUTINE X 2)
Culture: NO GROWTH
Culture: NO GROWTH
Special Requests: ADEQUATE

## 2021-10-01 ENCOUNTER — Ambulatory Visit
Admission: RE | Admit: 2021-10-01 | Discharge: 2021-10-01 | Disposition: A | Discharge status: Home / Self Care | Payer: Medicare Other | Source: Ambulatory Visit | Attending: Radiation Oncology | Admitting: Radiation Oncology

## 2021-10-01 ENCOUNTER — Ambulatory Visit: Payer: Self-pay

## 2021-10-01 ENCOUNTER — Other Ambulatory Visit: Payer: Self-pay

## 2021-10-01 DIAGNOSIS — C44329 Squamous cell carcinoma of skin of other parts of face: Secondary | ICD-10-CM | POA: Insufficient documentation

## 2021-10-01 DIAGNOSIS — F02C0 Dementia in other diseases classified elsewhere, severe, without behavioral disturbance, psychotic disturbance, mood disturbance, and anxiety: Secondary | ICD-10-CM | POA: Diagnosis not present

## 2021-10-01 DIAGNOSIS — G459 Transient cerebral ischemic attack, unspecified: Secondary | ICD-10-CM | POA: Diagnosis not present

## 2021-10-01 DIAGNOSIS — M6281 Muscle weakness (generalized): Secondary | ICD-10-CM | POA: Diagnosis not present

## 2021-10-01 DIAGNOSIS — R278 Other lack of coordination: Secondary | ICD-10-CM | POA: Diagnosis not present

## 2021-10-01 DIAGNOSIS — R2689 Other abnormalities of gait and mobility: Secondary | ICD-10-CM | POA: Diagnosis not present

## 2021-10-01 DIAGNOSIS — N1832 Chronic kidney disease, stage 3b: Secondary | ICD-10-CM | POA: Diagnosis not present

## 2021-10-01 DIAGNOSIS — R262 Difficulty in walking, not elsewhere classified: Secondary | ICD-10-CM | POA: Diagnosis not present

## 2021-10-01 DIAGNOSIS — R2681 Unsteadiness on feet: Secondary | ICD-10-CM | POA: Diagnosis not present

## 2021-10-01 LAB — RAD ONC ARIA SESSION SUMMARY
Course Elapsed Days: 28
Plan Fractions Treated to Date: 19
Plan Prescribed Dose Per Fraction: 2 Gy
Plan Total Fractions Prescribed: 25
Plan Total Prescribed Dose: 50 Gy
Reference Point Dosage Given to Date: 38 Gy
Reference Point Session Dosage Given: 2 Gy
Session Number: 19

## 2021-10-01 NOTE — Telephone Encounter (Signed)
Summary: discuss medication   Patients daughter states patient had a urine culutre done at the hospital on 6-26, patient had a uti, daughter states someone from the office called and stated  cephALEXin (KEFLEX) 500 MG capsule will treat the uti   Daughter states after she google the bacteria the patient has, it stated cephALEXin (KEFLEX) 500 MG capsule will not treat the uti   Daughter requesting a cb for further clarification     Pt's daughter asking for a call back. Pt's daughter looked up enterobacter not treated with Keflex. Pt daughter stated she is not demanding a different, but to review and make sure that is the Keflex is for the skin tear. Keflex prescribed prior to UTI cx results were back.

## 2021-10-01 NOTE — Telephone Encounter (Signed)
Spoke to pts daughter let her know that Keflex is used to treat UTIs. Asked her if the pt is having any symptoms. She stated that the pt was complaining of any symptoms beforehand. She asked if the keflex would work with her mothers skin tears I told her that the medication works for that as well. She verbalized understanding.  KP

## 2021-10-03 ENCOUNTER — Other Ambulatory Visit: Payer: Self-pay

## 2021-10-03 ENCOUNTER — Ambulatory Visit (INDEPENDENT_AMBULATORY_CARE_PROVIDER_SITE_OTHER): Payer: Medicare Other | Admitting: Plastic Surgery

## 2021-10-03 ENCOUNTER — Encounter: Payer: Self-pay | Admitting: Plastic Surgery

## 2021-10-03 ENCOUNTER — Telehealth: Payer: Self-pay

## 2021-10-03 ENCOUNTER — Ambulatory Visit
Admission: RE | Admit: 2021-10-03 | Discharge: 2021-10-03 | Disposition: A | Discharge status: Home / Self Care | Payer: Medicare Other | Source: Ambulatory Visit | Attending: Radiation Oncology | Admitting: Radiation Oncology

## 2021-10-03 VITALS — BP 109/63 | HR 76

## 2021-10-03 DIAGNOSIS — C44329 Squamous cell carcinoma of skin of other parts of face: Secondary | ICD-10-CM

## 2021-10-03 DIAGNOSIS — D45 Polycythemia vera: Secondary | ICD-10-CM

## 2021-10-03 DIAGNOSIS — N1832 Chronic kidney disease, stage 3b: Secondary | ICD-10-CM

## 2021-10-03 DIAGNOSIS — R262 Difficulty in walking, not elsewhere classified: Secondary | ICD-10-CM | POA: Diagnosis not present

## 2021-10-03 DIAGNOSIS — M6281 Muscle weakness (generalized): Secondary | ICD-10-CM | POA: Diagnosis not present

## 2021-10-03 DIAGNOSIS — S81811A Laceration without foreign body, right lower leg, initial encounter: Secondary | ICD-10-CM | POA: Diagnosis not present

## 2021-10-03 DIAGNOSIS — E440 Moderate protein-calorie malnutrition: Secondary | ICD-10-CM

## 2021-10-03 DIAGNOSIS — R2681 Unsteadiness on feet: Secondary | ICD-10-CM | POA: Diagnosis not present

## 2021-10-03 DIAGNOSIS — G459 Transient cerebral ischemic attack, unspecified: Secondary | ICD-10-CM | POA: Diagnosis not present

## 2021-10-03 DIAGNOSIS — Z5111 Encounter for antineoplastic chemotherapy: Secondary | ICD-10-CM

## 2021-10-03 DIAGNOSIS — F02C0 Dementia in other diseases classified elsewhere, severe, without behavioral disturbance, psychotic disturbance, mood disturbance, and anxiety: Secondary | ICD-10-CM | POA: Diagnosis not present

## 2021-10-03 LAB — RAD ONC ARIA SESSION SUMMARY
Course Elapsed Days: 30
Plan Fractions Treated to Date: 20
Plan Prescribed Dose Per Fraction: 2 Gy
Plan Total Fractions Prescribed: 25
Plan Total Prescribed Dose: 50 Gy
Reference Point Dosage Given to Date: 40 Gy
Reference Point Session Dosage Given: 2 Gy
Session Number: 20

## 2021-10-03 NOTE — Progress Notes (Signed)
Patient ID: Laura Wilcox, female    DOB: [birth year], 88 y.o.   MRN: [id number]   Chief Complaint  Patient presents with   Skin Problem    The patient is a 88 year old female here with her daughter for evaluation of her right leg.  She was getting off of the bus when she tripped and scraped her leg on the steps last week.  She was seen by the orthopedist who referred her to [place].  She has multiple medical conditions as seen below including skin cancer on her left cheek which she is getting treated for now.  The wound is approximately 3 x 11 cm full-thickness skin tear.  Does not look to be infected.    Review of Systems  Constitutional:  Positive for activity change and appetite change.  Eyes: Negative.   Respiratory:  Negative for chest tightness and shortness of breath.   Cardiovascular:  Positive for leg swelling.  Genitourinary: Negative.   Skin:  Positive for color change and wound.  Hematological:  Bruises/bleeds easily.    Past Medical History:  Diagnosis Date   Acute metabolic encephalopathy 93/79/0240   Confusion 12/30/2018   Polycythemia    SCC (squamous cell carcinoma) 04/11/2021   R med dorsum hand - ED&C SCCIS   Squamous cell carcinoma of skin 09/06/2020   R thumb - ED&C   Urinary tract infection without hematuria 01/02/2020   Urticaria 06/02/2017    Past Surgical History:  Procedure Laterality Date   ABDOMINAL HYSTERECTOMY     complete   BREAST SURGERY        Current Outpatient Medications:    acetaminophen (TYLENOL) 325 MG tablet, Take 325-650 mg by mouth every 6 (six) hours as needed for mild pain or moderate pain., Disp: , Rfl:    calcitRIOL (ROCALTROL) 0.25 MCG capsule, Take 0.25 mcg by mouth every Monday, Wednesday, and Friday., Disp: , Rfl:    citalopram (CELEXA) 10 MG tablet, Take 1 tablet (10 mg total) by mouth daily., Disp: 30 tablet, Rfl: 2   feeding supplement (ENSURE ENLIVE / ENSURE PLUS) LIQD, Take 237 mLs by mouth 2 (two) times daily  between meals., Disp: 237 mL, Rfl: 12   mupirocin ointment (BACTROBAN) 2 %, Apply to wound every other day, Disp: 22 g, Rfl: 1   polyethylene glycol (MIRALAX / GLYCOLAX) 17 g packet, Take 17 g by mouth daily as needed for mild constipation., Disp: 14 each, Rfl: 0   ruxolitinib phosphate (JAKAFI) 5 MG tablet, Take 1 tablet (5 mg total) by mouth See admin instructions. Take '10mg'$  in morning and '5mg'$  at night., Disp: 90 tablet, Rfl: 0   vitamin B-12 1000 MCG tablet, Take 1 tablet (1,000 mcg total) by mouth daily., Disp: , Rfl:    Objective:   There were no vitals filed for this visit.  Physical Exam HENT:     Head: Normocephalic.  Cardiovascular:     Rate and Rhythm: Normal rate.     Pulses: Normal pulses.  Pulmonary:     Effort: Pulmonary effort is normal.  Skin:    General: Skin is warm.     Capillary Refill: Capillary refill takes less than 2 seconds.     Findings: Bruising, erythema and lesion present.  Neurological:     Mental Status: She is alert. Mental status is at baseline.  Psychiatric:        Behavior: Behavior normal.     Assessment & Plan:  Polycythemia rubra vera (HCC)  Moderate protein-calorie  malnutrition ([place])  Encounter for antineoplastic chemotherapy  Chronic kidney disease, stage 3b (HCC)  Skin tear of lower leg without complication, right, initial encounter  Continue with the Xeroform until supplies arrive.  Plan will be to wash with the Vashe when the endoform is applied.  The endoform should be applied every 2 to 3 days as it gets incorporated.  Change the outer gauze and dressing daily. I would like to see her back in 2 weeks.  Pictures were obtained of the patient and placed in the chart with the patient's or guardian's permission.   [place], DO

## 2021-10-03 NOTE — Telephone Encounter (Signed)
Faxed wound care instructions to [facility] [phone]) with confirmed fax receipt. Sending to scan.  Faxed [facility] supply request for RLE wound. Sent to daughter's house since pt is in an assisted living facility. Confirmation of fax receipt. Copy send for scan.

## 2021-10-04 ENCOUNTER — Ambulatory Visit
Admission: RE | Admit: 2021-10-04 | Discharge: 2021-10-04 | Disposition: A | Discharge status: Home / Self Care | Payer: Medicare Other | Source: Ambulatory Visit | Attending: Radiation Oncology | Admitting: Radiation Oncology

## 2021-10-04 ENCOUNTER — Other Ambulatory Visit: Payer: Self-pay

## 2021-10-04 DIAGNOSIS — R2681 Unsteadiness on feet: Secondary | ICD-10-CM | POA: Diagnosis not present

## 2021-10-04 DIAGNOSIS — C44329 Squamous cell carcinoma of skin of other parts of face: Secondary | ICD-10-CM | POA: Diagnosis not present

## 2021-10-04 DIAGNOSIS — N1832 Chronic kidney disease, stage 3b: Secondary | ICD-10-CM | POA: Diagnosis not present

## 2021-10-04 DIAGNOSIS — N2581 Secondary hyperparathyroidism of renal origin: Secondary | ICD-10-CM | POA: Diagnosis not present

## 2021-10-04 DIAGNOSIS — R262 Difficulty in walking, not elsewhere classified: Secondary | ICD-10-CM | POA: Diagnosis not present

## 2021-10-04 DIAGNOSIS — S81811D Laceration without foreign body, right lower leg, subsequent encounter: Secondary | ICD-10-CM | POA: Diagnosis not present

## 2021-10-04 DIAGNOSIS — C060 Malignant neoplasm of cheek mucosa: Secondary | ICD-10-CM | POA: Diagnosis not present

## 2021-10-04 DIAGNOSIS — D631 Anemia in chronic kidney disease: Secondary | ICD-10-CM | POA: Diagnosis not present

## 2021-10-04 DIAGNOSIS — M6281 Muscle weakness (generalized): Secondary | ICD-10-CM | POA: Diagnosis not present

## 2021-10-04 DIAGNOSIS — N3000 Acute cystitis without hematuria: Secondary | ICD-10-CM | POA: Diagnosis not present

## 2021-10-04 DIAGNOSIS — R269 Unspecified abnormalities of gait and mobility: Secondary | ICD-10-CM | POA: Diagnosis not present

## 2021-10-04 DIAGNOSIS — R809 Proteinuria, unspecified: Secondary | ICD-10-CM | POA: Diagnosis not present

## 2021-10-04 DIAGNOSIS — F02C0 Dementia in other diseases classified elsewhere, severe, without behavioral disturbance, psychotic disturbance, mood disturbance, and anxiety: Secondary | ICD-10-CM | POA: Diagnosis not present

## 2021-10-04 DIAGNOSIS — G459 Transient cerebral ischemic attack, unspecified: Secondary | ICD-10-CM | POA: Diagnosis not present

## 2021-10-04 LAB — RAD ONC ARIA SESSION SUMMARY
Course Elapsed Days: 31
Plan Fractions Treated to Date: 21
Plan Prescribed Dose Per Fraction: 2 Gy
Plan Total Fractions Prescribed: 25
Plan Total Prescribed Dose: 50 Gy
Reference Point Dosage Given to Date: 42 Gy
Reference Point Session Dosage Given: 2 Gy
Session Number: 21

## 2021-10-05 ENCOUNTER — Other Ambulatory Visit: Payer: Self-pay

## 2021-10-05 ENCOUNTER — Encounter: Payer: Self-pay | Admitting: Plastic Surgery

## 2021-10-05 ENCOUNTER — Ambulatory Visit
Admission: RE | Admit: 2021-10-05 | Discharge: 2021-10-05 | Disposition: A | Discharge status: Home / Self Care | Payer: Medicare Other | Source: Ambulatory Visit | Attending: Radiation Oncology | Admitting: Radiation Oncology

## 2021-10-05 DIAGNOSIS — C44329 Squamous cell carcinoma of skin of other parts of face: Secondary | ICD-10-CM | POA: Diagnosis not present

## 2021-10-05 DIAGNOSIS — M6281 Muscle weakness (generalized): Secondary | ICD-10-CM | POA: Diagnosis not present

## 2021-10-05 DIAGNOSIS — F02C0 Dementia in other diseases classified elsewhere, severe, without behavioral disturbance, psychotic disturbance, mood disturbance, and anxiety: Secondary | ICD-10-CM | POA: Diagnosis not present

## 2021-10-05 DIAGNOSIS — R2681 Unsteadiness on feet: Secondary | ICD-10-CM | POA: Diagnosis not present

## 2021-10-05 DIAGNOSIS — R262 Difficulty in walking, not elsewhere classified: Secondary | ICD-10-CM | POA: Diagnosis not present

## 2021-10-05 DIAGNOSIS — G459 Transient cerebral ischemic attack, unspecified: Secondary | ICD-10-CM | POA: Diagnosis not present

## 2021-10-05 DIAGNOSIS — N1832 Chronic kidney disease, stage 3b: Secondary | ICD-10-CM

## 2021-10-05 LAB — RAD ONC ARIA SESSION SUMMARY
Course Elapsed Days: 32
Plan Fractions Treated to Date: 22
Plan Prescribed Dose Per Fraction: 2 Gy
Plan Total Fractions Prescribed: 25
Plan Total Prescribed Dose: 50 Gy
Reference Point Dosage Given to Date: 44 Gy
Reference Point Session Dosage Given: 2 Gy
Session Number: 22

## 2021-10-05 NOTE — Telephone Encounter (Signed)
Patient's daughter sent message over my chart.  I called the patient's daughter to discuss her MyChart message.  Discussed with the patient's daughter that we will resubmit the form for the endoform.  Discussed that in the meantime, to continue with the Xeroform changes.  Discussed with patient's daughter to call if she has any questions or concerns.

## 2021-10-06 DIAGNOSIS — M6281 Muscle weakness (generalized): Secondary | ICD-10-CM | POA: Diagnosis not present

## 2021-10-06 DIAGNOSIS — R262 Difficulty in walking, not elsewhere classified: Secondary | ICD-10-CM | POA: Diagnosis not present

## 2021-10-06 DIAGNOSIS — F02C0 Dementia in other diseases classified elsewhere, severe, without behavioral disturbance, psychotic disturbance, mood disturbance, and anxiety: Secondary | ICD-10-CM | POA: Diagnosis not present

## 2021-10-06 DIAGNOSIS — N1832 Chronic kidney disease, stage 3b: Secondary | ICD-10-CM | POA: Diagnosis not present

## 2021-10-06 DIAGNOSIS — G459 Transient cerebral ischemic attack, unspecified: Secondary | ICD-10-CM | POA: Diagnosis not present

## 2021-10-06 DIAGNOSIS — R2681 Unsteadiness on feet: Secondary | ICD-10-CM | POA: Diagnosis not present

## 2021-10-08 ENCOUNTER — Other Ambulatory Visit: Payer: Self-pay

## 2021-10-08 ENCOUNTER — Telehealth: Payer: Self-pay

## 2021-10-08 ENCOUNTER — Ambulatory Visit: Payer: Medicare Other

## 2021-10-08 ENCOUNTER — Inpatient Hospital Stay: Payer: Medicare Other | Attending: Oncology

## 2021-10-08 ENCOUNTER — Ambulatory Visit
Admission: RE | Admit: 2021-10-08 | Discharge: 2021-10-08 | Disposition: A | Discharge status: Home / Self Care | Payer: Medicare Other | Source: Ambulatory Visit | Attending: Radiation Oncology | Admitting: Radiation Oncology

## 2021-10-08 ENCOUNTER — Telehealth: Payer: Self-pay | Admitting: *Deleted

## 2021-10-08 DIAGNOSIS — Z923 Personal history of irradiation: Secondary | ICD-10-CM | POA: Insufficient documentation

## 2021-10-08 DIAGNOSIS — F02C0 Dementia in other diseases classified elsewhere, severe, without behavioral disturbance, psychotic disturbance, mood disturbance, and anxiety: Secondary | ICD-10-CM | POA: Diagnosis not present

## 2021-10-08 DIAGNOSIS — Z803 Family history of malignant neoplasm of breast: Secondary | ICD-10-CM | POA: Insufficient documentation

## 2021-10-08 DIAGNOSIS — R531 Weakness: Secondary | ICD-10-CM | POA: Insufficient documentation

## 2021-10-08 DIAGNOSIS — Z881 Allergy status to other antibiotic agents status: Secondary | ICD-10-CM | POA: Insufficient documentation

## 2021-10-08 DIAGNOSIS — Z8249 Family history of ischemic heart disease and other diseases of the circulatory system: Secondary | ICD-10-CM | POA: Insufficient documentation

## 2021-10-08 DIAGNOSIS — Z7982 Long term (current) use of aspirin: Secondary | ICD-10-CM | POA: Insufficient documentation

## 2021-10-08 DIAGNOSIS — D45 Polycythemia vera: Secondary | ICD-10-CM | POA: Insufficient documentation

## 2021-10-08 DIAGNOSIS — D7581 Myelofibrosis: Secondary | ICD-10-CM

## 2021-10-08 DIAGNOSIS — Z87440 Personal history of urinary (tract) infections: Secondary | ICD-10-CM | POA: Insufficient documentation

## 2021-10-08 DIAGNOSIS — F0390 Unspecified dementia without behavioral disturbance: Secondary | ICD-10-CM | POA: Insufficient documentation

## 2021-10-08 DIAGNOSIS — C44329 Squamous cell carcinoma of skin of other parts of face: Secondary | ICD-10-CM | POA: Diagnosis not present

## 2021-10-08 DIAGNOSIS — R4189 Other symptoms and signs involving cognitive functions and awareness: Secondary | ICD-10-CM | POA: Insufficient documentation

## 2021-10-08 DIAGNOSIS — Z79899 Other long term (current) drug therapy: Secondary | ICD-10-CM | POA: Insufficient documentation

## 2021-10-08 DIAGNOSIS — N1832 Chronic kidney disease, stage 3b: Secondary | ICD-10-CM | POA: Diagnosis not present

## 2021-10-08 DIAGNOSIS — M6281 Muscle weakness (generalized): Secondary | ICD-10-CM | POA: Diagnosis not present

## 2021-10-08 DIAGNOSIS — R2681 Unsteadiness on feet: Secondary | ICD-10-CM | POA: Diagnosis not present

## 2021-10-08 DIAGNOSIS — G459 Transient cerebral ischemic attack, unspecified: Secondary | ICD-10-CM | POA: Diagnosis not present

## 2021-10-08 DIAGNOSIS — D61818 Other pancytopenia: Secondary | ICD-10-CM | POA: Insufficient documentation

## 2021-10-08 DIAGNOSIS — Z818 Family history of other mental and behavioral disorders: Secondary | ICD-10-CM | POA: Insufficient documentation

## 2021-10-08 DIAGNOSIS — Z85828 Personal history of other malignant neoplasm of skin: Secondary | ICD-10-CM | POA: Insufficient documentation

## 2021-10-08 DIAGNOSIS — R262 Difficulty in walking, not elsewhere classified: Secondary | ICD-10-CM | POA: Diagnosis not present

## 2021-10-08 DIAGNOSIS — D631 Anemia in chronic kidney disease: Secondary | ICD-10-CM | POA: Insufficient documentation

## 2021-10-08 LAB — RAD ONC ARIA SESSION SUMMARY
Course Elapsed Days: 35
Plan Fractions Treated to Date: 23
Plan Prescribed Dose Per Fraction: 2 Gy
Plan Total Fractions Prescribed: 25
Plan Total Prescribed Dose: 50 Gy
Reference Point Dosage Given to Date: 46 Gy
Reference Point Session Dosage Given: 2 Gy
Session Number: 23

## 2021-10-08 LAB — CBC WITH DIFFERENTIAL/PLATELET
Abs Immature Granulocytes: 0.59 10*3/uL — ABNORMAL HIGH (ref 0.00–0.07)
Basophils Absolute: 0.2 10*3/uL — ABNORMAL HIGH (ref 0.0–0.1)
Basophils Relative: 2 %
Eosinophils Absolute: 0.6 10*3/uL — ABNORMAL HIGH (ref 0.0–0.5)
Eosinophils Relative: 5 %
HCT: 31.4 % — ABNORMAL LOW (ref 36.0–46.0)
Hemoglobin: 9.9 g/dL — ABNORMAL LOW (ref 12.0–15.0)
Immature Granulocytes: 5 %
Lymphocytes Relative: 12 %
Lymphs Abs: 1.5 10*3/uL (ref 0.7–4.0)
MCH: 29.5 pg (ref 26.0–34.0)
MCHC: 31.5 g/dL (ref 30.0–36.0)
MCV: 93.5 fL (ref 80.0–100.0)
Monocytes Absolute: 1.3 10*3/uL — ABNORMAL HIGH (ref 0.1–1.0)
Monocytes Relative: 11 %
Neutro Abs: 8.1 10*3/uL — ABNORMAL HIGH (ref 1.7–7.7)
Neutrophils Relative %: 65 %
Platelets: 816 10*3/uL — ABNORMAL HIGH (ref 150–400)
RBC: 3.36 MIL/uL — ABNORMAL LOW (ref 3.87–5.11)
RDW: 20.6 % — ABNORMAL HIGH (ref 11.5–15.5)
WBC: 12.4 10*3/uL — ABNORMAL HIGH (ref 4.0–10.5)
nRBC: 0 % (ref 0.0–0.2)

## 2021-10-08 LAB — COMPREHENSIVE METABOLIC PANEL
ALT: 13 U/L (ref 0–44)
AST: 18 U/L (ref 15–41)
Albumin: 3.6 g/dL (ref 3.5–5.0)
Alkaline Phosphatase: 83 U/L (ref 38–126)
Anion gap: 9 (ref 5–15)
BUN: 34 mg/dL — ABNORMAL HIGH (ref 8–23)
CO2: 24 mmol/L (ref 22–32)
Calcium: 8.4 mg/dL — ABNORMAL LOW (ref 8.9–10.3)
Chloride: 99 mmol/L (ref 98–111)
Creatinine, Ser: 1.44 mg/dL — ABNORMAL HIGH (ref 0.44–1.00)
GFR, Estimated: 34 mL/min — ABNORMAL LOW (ref >60)
Glucose, Bld: 138 mg/dL — ABNORMAL HIGH (ref 70–99)
Potassium: 4 mmol/L (ref 3.5–5.1)
Sodium: 132 mmol/L — ABNORMAL LOW (ref 135–145)
Total Bilirubin: 0.5 mg/dL (ref 0.3–1.2)
Total Protein: 6.6 g/dL (ref 6.5–8.1)

## 2021-10-08 LAB — TECHNOLOGIST SMEAR REVIEW: Plt Morphology: INCREASED

## 2021-10-08 NOTE — Telephone Encounter (Signed)
Per fax from [facility] didn't warrant the endoform. Form rewritten, faxed with correction, and receipt received.

## 2021-10-08 NOTE — Telephone Encounter (Signed)
Pts daughter is calling in to see if they can get a call back to see if they can get some assistance with the wound dressing for her mother.

## 2021-10-08 NOTE — Telephone Encounter (Signed)
Call placed to pt's daughter Arrie Aran in response to phone messages and mychart message received in regards to her mother's wound care supplies. Advised that [facility] had been sent updated information for pt's wound care orders. Also that I had attempted to call [facility] RN Charm Rings [phone]) but was unable to make contact. Will attempt again in am. Provided her with number for my direct voicemail if she has any further questions or issues. Verbalized understanding.

## 2021-10-09 ENCOUNTER — Other Ambulatory Visit: Payer: Self-pay

## 2021-10-09 ENCOUNTER — Ambulatory Visit
Admission: RE | Admit: 2021-10-09 | Discharge: 2021-10-09 | Disposition: A | Discharge status: Home / Self Care | Payer: Medicare Other | Source: Ambulatory Visit | Attending: Radiation Oncology | Admitting: Radiation Oncology

## 2021-10-09 ENCOUNTER — Ambulatory Visit: Payer: Medicare Other

## 2021-10-09 ENCOUNTER — Ambulatory Visit (INDEPENDENT_AMBULATORY_CARE_PROVIDER_SITE_OTHER): Payer: Medicare Other | Admitting: Dermatology

## 2021-10-09 ENCOUNTER — Telehealth: Payer: Self-pay | Admitting: *Deleted

## 2021-10-09 ENCOUNTER — Telehealth: Payer: Self-pay

## 2021-10-09 DIAGNOSIS — L578 Other skin changes due to chronic exposure to nonionizing radiation: Secondary | ICD-10-CM | POA: Diagnosis not present

## 2021-10-09 DIAGNOSIS — C44329 Squamous cell carcinoma of skin of other parts of face: Secondary | ICD-10-CM | POA: Diagnosis not present

## 2021-10-09 DIAGNOSIS — R262 Difficulty in walking, not elsewhere classified: Secondary | ICD-10-CM | POA: Diagnosis not present

## 2021-10-09 DIAGNOSIS — L821 Other seborrheic keratosis: Secondary | ICD-10-CM | POA: Diagnosis not present

## 2021-10-09 DIAGNOSIS — R2681 Unsteadiness on feet: Secondary | ICD-10-CM | POA: Diagnosis not present

## 2021-10-09 DIAGNOSIS — M6281 Muscle weakness (generalized): Secondary | ICD-10-CM | POA: Diagnosis not present

## 2021-10-09 DIAGNOSIS — C4492 Squamous cell carcinoma of skin, unspecified: Secondary | ICD-10-CM

## 2021-10-09 DIAGNOSIS — G459 Transient cerebral ischemic attack, unspecified: Secondary | ICD-10-CM | POA: Diagnosis not present

## 2021-10-09 DIAGNOSIS — C44320 Squamous cell carcinoma of skin of unspecified parts of face: Secondary | ICD-10-CM

## 2021-10-09 DIAGNOSIS — D489 Neoplasm of uncertain behavior, unspecified: Secondary | ICD-10-CM

## 2021-10-09 DIAGNOSIS — N1832 Chronic kidney disease, stage 3b: Secondary | ICD-10-CM | POA: Diagnosis not present

## 2021-10-09 DIAGNOSIS — F02C0 Dementia in other diseases classified elsewhere, severe, without behavioral disturbance, psychotic disturbance, mood disturbance, and anxiety: Secondary | ICD-10-CM | POA: Diagnosis not present

## 2021-10-09 LAB — RAD ONC ARIA SESSION SUMMARY
Course Elapsed Days: 36
Plan Fractions Treated to Date: 24
Plan Prescribed Dose Per Fraction: 2 Gy
Plan Total Fractions Prescribed: 25
Plan Total Prescribed Dose: 50 Gy
Reference Point Dosage Given to Date: 48 Gy
Reference Point Session Dosage Given: 2 Gy
Session Number: 24

## 2021-10-09 NOTE — Telephone Encounter (Signed)
Call received from [place], [place] [phone]) with [facility] to verify wound care orders. Updated her that [facility] had provided dressings as ordered. Wound care directions/orders faxed to her at [phone] with fax confirmation received

## 2021-10-09 NOTE — Patient Instructions (Signed)
Electrodesiccation and Curettage ("Scrape and Burn") Wound Care Instructions  Leave the original bandage on for 24 hours if possible.  If the bandage becomes soaked or soiled before that time, it is OK to remove it and examine the wound.  A small amount of post-operative bleeding is normal.  If excessive bleeding occurs, remove the bandage, place gauze over the site and apply continuous pressure (no peeking) over the area for 30 minutes. If this does not work, please call our clinic as soon as possible or page your doctor if it is after hours.   Once a day, cleanse the wound with soap and water. It is fine to shower. If a thick crust develops you may use a Q-tip dipped into dilute hydrogen peroxide (mix 1:1 with water) to dissolve it.  Hydrogen peroxide can slow the healing process, so use it only as needed.    After washing, apply petroleum jelly (Vaseline) or an antibiotic ointment if your doctor prescribed one for you, followed by a bandage.    For best healing, the wound should be covered with a layer of ointment at all times. If you are not able to keep the area covered with a bandage to hold the ointment in place, this may mean re-applying the ointment several times a day.  Continue this wound care until the wound has healed and is no longer open. It may take several weeks for the wound to heal and close.  Itching and mild discomfort is normal during the healing process.  If you have any discomfort, you can take Tylenol (acetaminophen) or ibuprofen as directed on the bottle. (Please do not take these if you have an allergy to them or cannot take them for another reason).  Some redness, tenderness and white or yellow material in the wound is normal healing.  If the area becomes very sore and red, or develops a thick yellow-green material (pus), it may be infected; please notify us.    Wound healing continues for up to one year following surgery. It is not unusual to experience pain in the scar  from time to time during the interval.  If the pain becomes severe or the scar thickens, you should notify the office.    A slight amount of redness in a scar is expected for the first six months.  After six months, the redness will fade and the scar will soften and fade.  The color difference becomes less noticeable with time.  If there are any problems, return for a post-op surgery check at your earliest convenience.  To improve the appearance of the scar, you can use silicone scar gel, cream, or sheets (such as Mederma or Serica) every night for up to one year. These are available over the counter (without a prescription).  Please call our office at [phone] for any questions or concerns.  Biopsy Wound Care Instructions  Leave the original bandage on for 24 hours if possible.  If the bandage becomes soaked or soiled before that time, it is OK to remove it and examine the wound.  A small amount of post-operative bleeding is normal.  If excessive bleeding occurs, remove the bandage, place gauze over the site and apply continuous pressure (no peeking) over the area for 30 minutes. If this does not work, please call our clinic as soon as possible or page your doctor if it is after hours.   Once a day, cleanse the wound with soap and water. It is fine to shower. If   a thick crust develops you may use a Q-tip dipped into dilute hydrogen peroxide (mix 1:1 with water) to dissolve it.  Hydrogen peroxide can slow the healing process, so use it only as needed.    After washing, apply petroleum jelly (Vaseline) or an antibiotic ointment if your doctor prescribed one for you, followed by a bandage.    For best healing, the wound should be covered with a layer of ointment at all times. If you are not able to keep the area covered with a bandage to hold the ointment in place, this may mean re-applying the ointment several times a day.  Continue this wound care until the wound has healed and is no longer  open.   Itching and mild discomfort is normal during the healing process. However, if you develop pain or severe itching, please call our office.   If you have any discomfort, you can take Tylenol (acetaminophen) or ibuprofen as directed on the bottle. (Please do not take these if you have an allergy to them or cannot take them for another reason).  Some redness, tenderness and white or yellow material in the wound is normal healing.  If the area becomes very sore and red, or develops a thick yellow-green material (pus), it may be infected; please notify us.    If you have stitches, return to clinic as directed to have the stitches removed. You will continue wound care for 2-3 days after the stitches are removed.   Wound healing continues for up to one year following surgery. It is not unusual to experience pain in the scar from time to time during the interval.  If the pain becomes severe or the scar thickens, you should notify the office.    A slight amount of redness in a scar is expected for the first six months.  After six months, the redness will fade and the scar will soften and fade.  The color difference becomes less noticeable with time.  If there are any problems, return for a post-op surgery check at your earliest convenience.  To improve the appearance of the scar, you can use silicone scar gel, cream, or sheets (such as Mederma or Serica) every night for up to one year. These are available over the counter (without a prescription).  Please call our office at [phone] for any questions or concerns.       Due to recent changes in healthcare laws, you may see results of your pathology and/or laboratory studies on MyChart before the doctors have had a chance to review them. We understand that in some cases there may be results that are confusing or concerning to you. Please understand that not all results are received at the same time and often the doctors may need to interpret  multiple results in order to provide you with the best plan of care or course of treatment. Therefore, we ask that you please give us 2 business days to thoroughly review all your results before contacting the office for clarification. Should we see a critical lab result, you will be contacted sooner.   If You Need Anything After Your Visit  If you have any questions or concerns for your doctor, please call our main line at [phone] and press option 4 to reach your doctor's medical assistant. If no one answers, please leave a voicemail as directed and we will return your call as soon as possible. Messages left after 4 pm will be answered the following business day.     You may also send us a message via MyChart. We typically respond to MyChart messages within 1-2 business days.  For prescription refills, please ask your pharmacy to contact our office. Our fax number is [phone].  If you have an urgent issue when the clinic is closed that cannot wait until the next business day, you can page your doctor at the number below.    Please note that while we do our best to be available for urgent issues outside of office hours, we are not available 24/7.   If you have an urgent issue and are unable to reach us, you may choose to seek medical care at your doctor's office, retail clinic, urgent care center, or emergency room.  If you have a medical emergency, please immediately call 911 or go to the emergency department.  Pager Numbers  - Dr. Kowalski: [phone]  - Dr. Moye: [phone]  - Dr. Stewart: [phone]  In the event of inclement weather, please call our main line at [phone] for an update on the status of any delays or closures.  Dermatology Medication Tips: Please keep the boxes that topical medications come in in order to help keep track of the instructions about where and how to use these. Pharmacies typically print the medication instructions only on the boxes and  not directly on the medication tubes.   If your medication is too expensive, please contact our office at [phone] option 4 or send us a message through MyChart.   We are unable to tell what your co-pay for medications will be in advance as this is different depending on your insurance coverage. However, we may be able to find a substitute medication at lower cost or fill out paperwork to get insurance to cover a needed medication.   If a prior authorization is required to get your medication covered by your insurance company, please allow us 1-2 business days to complete this process.  Drug prices often vary depending on where the prescription is filled and some pharmacies may offer cheaper prices.  The website www.goodrx.com contains coupons for medications through different pharmacies. The prices here do not account for what the cost may be with help from insurance (it may be cheaper with your insurance), but the website can give you the price if you did not use any insurance.  - You can print the associated coupon and take it with your prescription to the pharmacy.  - You may also stop by our office during regular business hours and pick up a GoodRx coupon card.  - If you need your prescription sent electronically to a different pharmacy, notify our office through [facility] MyChart or by phone at [phone] option 4.     Si Usted Necesita Algo Despus de Su Visita  Tambin puede enviarnos un mensaje a travs de MyChart. Por lo general respondemos a los mensajes de MyChart en el transcurso de 1 a 2 das hbiles.  Para renovar recetas, por favor pida a su farmacia que se ponga en contacto con nuestra oficina. Nuestro nmero de fax es el [phone].  Si tiene un asunto urgente cuando la clnica est cerrada y que no puede esperar hasta el siguiente da hbil, puede llamar/localizar a su doctor(a) al nmero que aparece a continuacin.   Por favor, tenga en cuenta que aunque  hacemos todo lo posible para estar disponibles para asuntos urgentes fuera del horario de oficina, no estamos disponibles las 24 horas del da, los 7 das de la semana.     Si tiene un problema urgente y no puede comunicarse con nosotros, puede optar por buscar atencin mdica  en el consultorio de su doctor(a), en una clnica privada, en un centro de atencin urgente o en una sala de emergencias.  Si tiene una emergencia mdica, por favor llame inmediatamente al 911 o vaya a la sala de emergencias.  Nmeros de bper  - Dr. Kowalski: [phone]  - Dra. Moye: [phone]  - Dra. Stewart: [phone]  En caso de inclemencias del tiempo, por favor llame a nuestra lnea principal al [phone] para una actualizacin sobre el estado de cualquier retraso o cierre.  Consejos para la medicacin en dermatologa: Por favor, guarde las cajas en las que vienen los medicamentos de uso tpico para ayudarle a seguir las instrucciones sobre dnde y cmo usarlos. Las farmacias generalmente imprimen las instrucciones del medicamento slo en las cajas y no directamente en los tubos del medicamento.   Si su medicamento es muy caro, por favor, pngase en contacto con nuestra oficina llamando al [phone] y presione la opcin 4 o envenos un mensaje a travs de MyChart.   No podemos decirle cul ser su copago por los medicamentos por adelantado ya que esto es diferente dependiendo de la cobertura de su seguro. Sin embargo, es posible que podamos encontrar un medicamento sustituto a menor costo o llenar un formulario para que el seguro cubra el medicamento que se considera necesario.   Si se requiere una autorizacin previa para que su compaa de seguros cubra su medicamento, por favor permtanos de 1 a 2 das hbiles para completar este proceso.  Los precios de los medicamentos varan con frecuencia dependiendo del lugar de dnde se surte la receta y alguna farmacias pueden ofrecer precios ms  baratos.  El sitio web www.goodrx.com tiene cupones para medicamentos de diferentes farmacias. Los precios aqu no tienen en cuenta lo que podra costar con la ayuda del seguro (puede ser ms barato con su seguro), pero el sitio web puede darle el precio si no utiliz ningn seguro.  - Puede imprimir el cupn correspondiente y llevarlo con su receta a la farmacia.  - Tambin puede pasar por nuestra oficina durante el horario de atencin regular y recoger una tarjeta de cupones de GoodRx.  - Si necesita que su receta se enve electrnicamente a una farmacia diferente, informe a nuestra oficina a travs de MyChart de [place] o por telfono llamando al [phone] y presione la opcin 4.  

## 2021-10-09 NOTE — Progress Notes (Signed)
Follow-Up Visit   Subjective  [name] is a 88 y.o. female who presents for the following: Follow-up (Patient here with daughter to recheck previous isk at right popliteal fossa that was treated with LN2 . Daughter states patient has not mentioned area is bothering her. Daughter states patient is also here to discuss treatment needed for left lateral eye. Patient was referred to have radiation preformed on area as well as right cheek. Daughter states provider was not able to treat area due to it being to location and could cause some damage to eye. ).  The patient has spots, moles and lesions to be evaluated, some may be new or changing and the patient has concerns that these could be cancer.  The following portions of the chart were reviewed this encounter and updated as appropriate:      Review of Systems: No other skin or systemic complaints except as noted in HPI or Assessment and Plan.   Objective  Well appearing patient in no apparent distress; mood and affect are within normal limits.  A focused examination was performed including left lateral eye, right popliteal fossa, . Relevant physical exam findings are noted in the Assessment and Plan.  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 Anesthesia: the lesion was anesthetized in a standard fashion   Anesthetic:  1% lidocaine w/ epinephrine 1-100,000 buffered w/ 8.4% NaHCO3 Instrument used: flexible razor blade   Hemostasis achieved with: pressure, aluminum chloride and electrodesiccation   Outcome: patient tolerated procedure well    Destruction of lesion  Destruction method:  electrodesiccation and curettage   Informed consent: discussed and consent obtained   Timeout:  patient name, date of birth, surgical site, and procedure verified Curettage performed in three different directions: Yes   Electrodesiccation performed over the curetted area: Yes   Final wound size (cm):  1.2 Hemostasis achieved with:  pressure, aluminum chloride and electrodesiccation Outcome: patient tolerated procedure well with no complications   Post-procedure details: wound care instructions given   Additional details:  Mupirocin ointment and Bandaid applied   Specimen 1 - Surgical pathology Differential Diagnosis: r/o SCC  Check Margins: No  R/o SCC  Patient was unable to have treated with radiation due to being close to tear duct. Recommended shave removal with EDC.     Squamous cell carcinoma of skin Left Parotid Area  Pt is undergoing radiation for SCC L cheek.  Has one more treatment.  Will continue to heal post treatment.  Seborrheic Keratoses - Stuck-on, waxy, tan-brown papules and/or plaques  - Benign-appearing - Discussed benign etiology and prognosis. - Observe - Call for any changes  Actinic Damage - chronic, secondary to cumulative UV radiation exposure/sun exposure over time - diffuse scaly erythematous macules with underlying dyspigmentation - Recommend daily broad spectrum sunscreen SPF 30+ to sun-exposed areas, reapply every 2 hours as needed.  - Recommend staying in the shade or wearing long sleeves, sun glasses (UVA+UVB protection) and wide brim hats (4-inch brim around the entire circumference of the hat). - Call for new or changing lesions.  Return in about 3 months (around 01/09/2022) for follow up SCC. I, Melissa  Redmond Pulling, CMA, am acting as scribe for Brendolyn Patty, MD.  Documentation: I have reviewed the above documentation for accuracy and completeness, and I agree with the above.  Brendolyn Patty MD

## 2021-10-09 NOTE — Telephone Encounter (Signed)
Received fax from [facility] has been provided for the patient; no further action is required.

## 2021-10-10 ENCOUNTER — Other Ambulatory Visit: Payer: Self-pay | Admitting: Internal Medicine

## 2021-10-10 ENCOUNTER — Other Ambulatory Visit: Payer: Self-pay

## 2021-10-10 ENCOUNTER — Ambulatory Visit
Admission: RE | Admit: 2021-10-10 | Discharge: 2021-10-10 | Disposition: A | Discharge status: Home / Self Care | Payer: Medicare Other | Source: Ambulatory Visit | Attending: Radiation Oncology | Admitting: Radiation Oncology

## 2021-10-10 DIAGNOSIS — F02C0 Dementia in other diseases classified elsewhere, severe, without behavioral disturbance, psychotic disturbance, mood disturbance, and anxiety: Secondary | ICD-10-CM | POA: Diagnosis not present

## 2021-10-10 DIAGNOSIS — N1832 Chronic kidney disease, stage 3b: Secondary | ICD-10-CM | POA: Diagnosis not present

## 2021-10-10 DIAGNOSIS — R262 Difficulty in walking, not elsewhere classified: Secondary | ICD-10-CM | POA: Diagnosis not present

## 2021-10-10 DIAGNOSIS — M6281 Muscle weakness (generalized): Secondary | ICD-10-CM | POA: Diagnosis not present

## 2021-10-10 DIAGNOSIS — G459 Transient cerebral ischemic attack, unspecified: Secondary | ICD-10-CM | POA: Diagnosis not present

## 2021-10-10 DIAGNOSIS — C44329 Squamous cell carcinoma of skin of other parts of face: Secondary | ICD-10-CM | POA: Diagnosis not present

## 2021-10-10 DIAGNOSIS — R2681 Unsteadiness on feet: Secondary | ICD-10-CM | POA: Diagnosis not present

## 2021-10-10 LAB — RAD ONC ARIA SESSION SUMMARY
Course Elapsed Days: 37
Plan Fractions Treated to Date: 25
Plan Prescribed Dose Per Fraction: 2 Gy
Plan Total Fractions Prescribed: 25
Plan Total Prescribed Dose: 50 Gy
Reference Point Dosage Given to Date: 50 Gy
Reference Point Session Dosage Given: 2 Gy
Session Number: 25

## 2021-10-11 ENCOUNTER — Other Ambulatory Visit (HOSPITAL_COMMUNITY): Payer: Self-pay

## 2021-10-11 DIAGNOSIS — E440 Moderate protein-calorie malnutrition: Secondary | ICD-10-CM | POA: Diagnosis not present

## 2021-10-11 DIAGNOSIS — N1832 Chronic kidney disease, stage 3b: Secondary | ICD-10-CM | POA: Diagnosis not present

## 2021-10-11 DIAGNOSIS — C4490 Unspecified malignant neoplasm of skin, unspecified: Secondary | ICD-10-CM | POA: Diagnosis not present

## 2021-10-11 DIAGNOSIS — D62 Acute posthemorrhagic anemia: Secondary | ICD-10-CM | POA: Diagnosis not present

## 2021-10-11 DIAGNOSIS — F05 Delirium due to known physiological condition: Secondary | ICD-10-CM | POA: Diagnosis not present

## 2021-10-11 DIAGNOSIS — R32 Unspecified urinary incontinence: Secondary | ICD-10-CM | POA: Diagnosis not present

## 2021-10-11 DIAGNOSIS — Z9181 History of falling: Secondary | ICD-10-CM | POA: Diagnosis not present

## 2021-10-11 DIAGNOSIS — D45 Polycythemia vera: Secondary | ICD-10-CM | POA: Diagnosis not present

## 2021-10-11 DIAGNOSIS — A419 Sepsis, unspecified organism: Secondary | ICD-10-CM | POA: Diagnosis not present

## 2021-10-11 DIAGNOSIS — D7581 Myelofibrosis: Secondary | ICD-10-CM | POA: Diagnosis not present

## 2021-10-11 DIAGNOSIS — N3000 Acute cystitis without hematuria: Secondary | ICD-10-CM | POA: Diagnosis not present

## 2021-10-11 DIAGNOSIS — F321 Major depressive disorder, single episode, moderate: Secondary | ICD-10-CM | POA: Diagnosis not present

## 2021-10-11 DIAGNOSIS — S81811A Laceration without foreign body, right lower leg, initial encounter: Secondary | ICD-10-CM | POA: Diagnosis not present

## 2021-10-11 DIAGNOSIS — N2581 Secondary hyperparathyroidism of renal origin: Secondary | ICD-10-CM | POA: Diagnosis not present

## 2021-10-11 DIAGNOSIS — F39 Unspecified mood [affective] disorder: Secondary | ICD-10-CM | POA: Diagnosis not present

## 2021-10-11 DIAGNOSIS — D631 Anemia in chronic kidney disease: Secondary | ICD-10-CM | POA: Diagnosis not present

## 2021-10-11 NOTE — Telephone Encounter (Signed)
Requested medications are due for refill today.  unsure  Requested medications are on the active medications list.  yes  Last refill. 07/03/2021 - unknown quantity  Future visit scheduled.   no  Notes to clinic.  Rx signed by Dr. Reesa Chew.    Requested Prescriptions  Pending Prescriptions Disp Refills   vitamin B-12 (CYANOCOBALAMIN) 1000 MCG tablet [Pharmacy Med Name: VITAMIN B-12 1000 MCG TAB] 30 tablet     Sig: TAKE 1 TABLET BY MOUTH DAILY     Endocrinology:  Vitamins - Vitamin B12 Failed - 10/10/2021  6:03 PM      Failed - HCT in normal range and within 360 days    HCT  Date Value Ref Range Status  10/08/2021 31.4 (L) 36.0 - 46.0 % Final   Hematocrit  Date Value Ref Range Status  06/18/2021 25.2 (L) 34.0 - 46.6 % Final         Failed - HGB in normal range and within 360 days    Hemoglobin  Date Value Ref Range Status  10/08/2021 9.9 (L) 12.0 - 15.0 g/dL Final  06/18/2021 8.1 (L) 11.1 - 15.9 g/dL Final         Passed - B12 Level in normal range and within 360 days    Vitamin B-12  Date Value Ref Range Status  06/27/2021 275 180 - 914 pg/mL Final    Comment:    (NOTE) This assay is not validated for testing neonatal or myeloproliferative syndrome specimens for Vitamin B12 levels. Performed at [facility], [place] [address] [place], [place] [address]          Passed - Valid encounter within last 12 months    Recent Outpatient Visits           3 months ago Age-related cognitive decline   [facility] Glean Hess, MD   6 months ago Confusion and disorientation   [facility] Glean Hess, MD   11 months ago Acute renal failure superimposed on stage 3b chronic kidney disease, unspecified acute renal failure type [facility])   [facility] Glean Hess, MD   1 year ago Recurrent UTI   [facility] Glean Hess, MD   1 year ago Back strain, initial encounter   [facility] Glean Hess, MD        Future Appointments             In 3 months Brendolyn Patty, MD [place]

## 2021-10-13 DIAGNOSIS — D45 Polycythemia vera: Secondary | ICD-10-CM | POA: Diagnosis not present

## 2021-10-13 DIAGNOSIS — S81811A Laceration without foreign body, right lower leg, initial encounter: Secondary | ICD-10-CM | POA: Diagnosis not present

## 2021-10-13 DIAGNOSIS — D631 Anemia in chronic kidney disease: Secondary | ICD-10-CM | POA: Diagnosis not present

## 2021-10-13 DIAGNOSIS — N1832 Chronic kidney disease, stage 3b: Secondary | ICD-10-CM | POA: Diagnosis not present

## 2021-10-13 DIAGNOSIS — C4490 Unspecified malignant neoplasm of skin, unspecified: Secondary | ICD-10-CM | POA: Diagnosis not present

## 2021-10-13 DIAGNOSIS — F39 Unspecified mood [affective] disorder: Secondary | ICD-10-CM | POA: Diagnosis not present

## 2021-10-15 ENCOUNTER — Other Ambulatory Visit (HOSPITAL_COMMUNITY): Payer: Self-pay

## 2021-10-15 ENCOUNTER — Other Ambulatory Visit: Payer: Self-pay | Admitting: Oncology

## 2021-10-15 DIAGNOSIS — D45 Polycythemia vera: Secondary | ICD-10-CM

## 2021-10-15 MED ORDER — RUXOLITINIB PHOSPHATE 5 MG PO TABS
5.0000 mg | ORAL_TABLET | ORAL | 0 refills | Status: DC
  Filled 2021-10-15: qty 90, 30d supply, fill #0

## 2021-10-15 NOTE — Telephone Encounter (Signed)
CBC with Differential/Platelet Order: [id number] Status: Final result    Visible to patient: Yes (seen)    Next appt: 10/19/2021 at 11:30 AM in Plastic Surgery Clance Boll, PA-C)    Dx: Myelofibrosis [facility])    0 Result Notes           Component Ref Range & Units 7 d ago 2 wk ago 3 wk ago 4 wk ago 1 mo ago 2 mo ago 3 mo ago  WBC 4.0 - 10.5 K/uL 12.4 High   12.5 High   12.4 High   13.0 High   13.7 High   14.5 High   8.3   RBC 3.87 - 5.11 MIL/uL 3.36 Low   2.73 Low   3.62 Low   4.09  3.99  3.76 Low   3.14 Low    Hemoglobin 12.0 - 15.0 g/dL 9.9 Low   7.5 Low   9.9 Low   11.6 Low   11.5 Low   10.8 Low   9.1 Low    HCT 36.0 - 46.0 % 31.4 Low   25.1 Low   33.3 Low   37.7  37.4  35.7 Low   28.9 Low    MCV 80.0 - 100.0 fL 93.5  91.9  92.0  92.2  93.7  94.9  92.0   MCH 26.0 - 34.0 pg 29.5  27.5  27.3  28.4  28.8  28.7  29.0   MCHC 30.0 - 36.0 g/dL 31.5  29.9 Low   29.7 Low   30.8  30.7  30.3  31.5   RDW 11.5 - 15.5 % 20.6 High   22.1 High   22.6 High   22.4 High   21.9 High   23.1 High   21.6 High    Platelets 150 - 400 K/uL 816 High   628 High   796 High   911 High Panic  CM  922 High Panic  CM  762 High   414 High    nRBC 0.0 - 0.2 % 0.0  0.0 CM  0.0  0.0  0.0  0.3 High   0.8 High    Neutrophils Relative % % 65   59  60  64  72  58   Neutro Abs 1.7 - 7.7 K/uL 8.1 High    7.3  7.9 High   8.8 High   10.5 High   4.7   Lymphocytes Relative % '12   18  16  14  10  17   '$ Lymphs Abs 0.7 - 4.0 K/uL 1.5   2.2  2.1  2.0  1.4  1.4   Monocytes Relative % '11   9  9  9  8  9   '$ Monocytes Absolute 0.1 - 1.0 K/uL 1.3 High    1.2 High   1.1 High   1.2 High   1.1 High   0.8   Eosinophils Relative % '5   6  7  6  4  5   '$ Eosinophils Absolute 0.0 - 0.5 K/uL 0.6 High    0.7 High   0.8 High   0.8 High   0.5  0.4   Basophils Relative % '2   2  2  2  1  1   '$ Basophils Absolute 0.0 - 0.1 K/uL 0.2 High    0.2 High   0.3 High   0.3 High   0.2 High   0.1   Immature Granulocytes % 5   6  6  $'5  5  10   'q$ Abs Immature  Granulocytes 0.00 - 0.07 K/uL 0.59 High    0.71 High  CM  0.74 High  CM  0.64 High   0.68 High  CM  0.83 High  CM   Comment: Performed at [facility], [address] [place], [place] [address]  WBC Morphology    MORPHOLOGY UNREMARKABLE  MILD LEFT SHIFT (1-5% METAS, OCC MYELO, OCC BANDS) CM  MORPHOLOGY UNREMARKABLE   MILD LEFT SHIFT (1-5% METAS, OCC MYELO, OCC BANDS)   RBC Morphology    MIXED RBC POPULATION  MIXED RBC POPULATION.  MORPHOLOGY UNREMARKABLE   MORPHOLOGY UNREMARKABLE   Smear Review    Normal platelet morphology  PLATELETS APPEAR INCREASED CM  PLATELETS APPEAR INCREASED CM   Normal platelet morphology   Giant PLTs      PRESENT CM     Resulting Agency  [facility] [facility] [place] [facility] [place] [facility]         Specimen Collected: 10/08/21 14:20 Last Resulted: 10/08/21 14:38      Lab Flowsheet    Order Details    View Encounter    Lab and Collection Details    Routing    Result History    View All Conversations on this Encounter      CM=Additional comments      Result Care Coordination   Patient Communication   Add Comments   Seen Back to Top       Other Results from 10/08/2021  Technologist smear review Order: [id number] Status: Final result    Visible to patient: Yes (seen)    Next appt: 10/19/2021 at 11:30 AM in Plastic Surgery Clance Boll, PA-C)    Dx: Chronic kidney disease, stage 3b ([place])    0 Result Notes    Component 7 d ago  WBC MORPHOLOGY MILD LEFT SHIFT (1-5% METAS, OCC MYELO, OCC BANDS)   RBC MORPHOLOGY MIXED RBC POPULATION   Tech Review PLATELETS APPEAR INCREASED   Comment: PLATELETS VARY IN SIZE WITH [place]  Performed at [facility], [address] [place], [place] [address]   Resulting Agency [facility]         Specimen Collected: 10/08/21 14:22 Last Resulted: 10/08/21 15:53      Lab Flowsheet    Order Details    View Encounter    Lab and Collection Details     Routing    Result History    View All Conversations on this Encounter        Result Care Coordination   Patient Communication   Add Comments   Seen Back to Top          Contains abnormal data Comprehensive metabolic panel Order: [id number] Status: Final result    Visible to patient: Yes (seen)    Next appt: 10/19/2021 at 11:30 AM in Plastic Surgery Clance Boll, PA-C)    Dx: Myelofibrosis [facility])    0 Result Notes           Component Ref Range & Units 7 d ago 2 wk ago 3 wk ago 4 wk ago 1 mo ago 2 mo ago 3 mo ago  Sodium 135 - 145 mmol/L 132 Low   141 CM  136  136  135  133 Low   139   Potassium 3.5 - 5.1 mmol/L 4.0  4.4  4.6  4.9  4.4  4.2  3.9   Chloride 98 - 111 mmol/L 99  115 High   107  101  100  101  106   CO2 22 - 32 mmol/L '24  24  21 '$ Low   '26  27  26  27   '$ Glucose, Bld 70 - 99 mg/dL 138 High   95 CM  134 High  CM  152 High  CM  166 High  CM  165 High  CM  86 CM   Comment: Glucose reference range applies only to samples taken after fasting for at least 8 hours.  BUN 8 - 23 mg/dL 34 High   23  36 High   39 High   23  26 High   29 High    Creatinine, Ser 0.44 - 1.00 mg/dL 1.44 High   0.84  1.43 High   1.33 High   1.13 High   1.47 High   1.05 High    Calcium 8.9 - 10.3 mg/dL 8.4 Low   7.9 Low   8.6 Low   8.9  8.6 Low   9.0  8.6 Low    Total Protein 6.5 - 8.1 g/dL 6.6  4.9 Low   6.3 Low   7.0  7.0  7.2    Albumin 3.5 - 5.0 g/dL 3.6  2.9 Low   3.5  3.9  3.8  4.0    AST 15 - 41 U/L 18  14 Low   '21  17  20  16    '$ ALT 0 - 44 U/L '13  10  11  11  12  11    '$ Alkaline Phosphatase 38 - 126 U/L 83  73  88  101  96  101    Total Bilirubin 0.3 - 1.2 mg/dL 0.5  0.3  0.2 Low   0.4  0.5  0.1 Low     GFR, Estimated >60 mL/min 34 Low   >60 CM  34 Low  CM  38 Low  CM  46 Low  CM  33 Low  CM  50 Low  CM   Comment: (NOTE)  Calculated using the CKD-EPI Creatinine Equation (2021)   Anion gap 5 - '15 9  2 '$ Low  CM  8 CM  9 CM  8 CM  6 CM  6 CM   Comment: Performed at [facility], [address] [place], [place] [address]  Resulting Agency  [facility] [place] [facility] [place] [facility] [place] [facility] [place] [facility] [place] [facility] [place]         Specimen Collected: 10/08/21 14:20 Last Resulted: 10/08/21 14:45

## 2021-10-16 ENCOUNTER — Telehealth: Payer: Self-pay

## 2021-10-16 ENCOUNTER — Other Ambulatory Visit (HOSPITAL_COMMUNITY): Payer: Self-pay

## 2021-10-16 DIAGNOSIS — F0280 Dementia in other diseases classified elsewhere without behavioral disturbance: Secondary | ICD-10-CM | POA: Diagnosis not present

## 2021-10-16 DIAGNOSIS — F39 Unspecified mood [affective] disorder: Secondary | ICD-10-CM | POA: Diagnosis not present

## 2021-10-16 DIAGNOSIS — D531 Other megaloblastic anemias, not elsewhere classified: Secondary | ICD-10-CM | POA: Diagnosis not present

## 2021-10-16 DIAGNOSIS — N1832 Chronic kidney disease, stage 3b: Secondary | ICD-10-CM | POA: Diagnosis not present

## 2021-10-16 DIAGNOSIS — D631 Anemia in chronic kidney disease: Secondary | ICD-10-CM | POA: Diagnosis not present

## 2021-10-16 DIAGNOSIS — M6281 Muscle weakness (generalized): Secondary | ICD-10-CM | POA: Diagnosis not present

## 2021-10-16 DIAGNOSIS — G301 Alzheimer's disease with late onset: Secondary | ICD-10-CM | POA: Diagnosis not present

## 2021-10-16 DIAGNOSIS — S81811D Laceration without foreign body, right lower leg, subsequent encounter: Secondary | ICD-10-CM | POA: Diagnosis not present

## 2021-10-16 DIAGNOSIS — D45 Polycythemia vera: Secondary | ICD-10-CM | POA: Diagnosis not present

## 2021-10-16 DIAGNOSIS — C4490 Unspecified malignant neoplasm of skin, unspecified: Secondary | ICD-10-CM | POA: Diagnosis not present

## 2021-10-16 DIAGNOSIS — S81811A Laceration without foreign body, right lower leg, initial encounter: Secondary | ICD-10-CM | POA: Diagnosis not present

## 2021-10-16 DIAGNOSIS — R2689 Other abnormalities of gait and mobility: Secondary | ICD-10-CM | POA: Diagnosis not present

## 2021-10-16 NOTE — Telephone Encounter (Signed)
Advised pt's daughter of bx results.  Advised to have pt keep her f/u appt with Dr. Vira Blanco

## 2021-10-16 NOTE — Telephone Encounter (Signed)
-----   Message from Alfonso Patten, MD sent at 10/16/2021  1:15 PM EDT ----- SCC, already treated with ED&C. Recheck at follow-up in Oct with Dr. Nicole Kindred.  MAs please call. Thank you!

## 2021-10-17 ENCOUNTER — Other Ambulatory Visit: Payer: Self-pay | Admitting: *Deleted

## 2021-10-17 MED ORDER — CITALOPRAM HYDROBROMIDE 10 MG PO TABS: 10.0000 mg | ORAL_TABLET | Freq: Every day | ORAL | 2 refills | Status: DC

## 2021-10-18 ENCOUNTER — Telehealth: Payer: Self-pay

## 2021-10-18 ENCOUNTER — Other Ambulatory Visit (HOSPITAL_COMMUNITY): Payer: Self-pay

## 2021-10-18 DIAGNOSIS — S81811A Laceration without foreign body, right lower leg, initial encounter: Secondary | ICD-10-CM | POA: Diagnosis not present

## 2021-10-18 DIAGNOSIS — G301 Alzheimer's disease with late onset: Secondary | ICD-10-CM | POA: Diagnosis not present

## 2021-10-18 DIAGNOSIS — S81811D Laceration without foreign body, right lower leg, subsequent encounter: Secondary | ICD-10-CM | POA: Diagnosis not present

## 2021-10-18 DIAGNOSIS — E782 Mixed hyperlipidemia: Secondary | ICD-10-CM | POA: Diagnosis not present

## 2021-10-18 DIAGNOSIS — D531 Other megaloblastic anemias, not elsewhere classified: Secondary | ICD-10-CM | POA: Diagnosis not present

## 2021-10-18 DIAGNOSIS — F02B0 Dementia in other diseases classified elsewhere, moderate, without behavioral disturbance, psychotic disturbance, mood disturbance, and anxiety: Secondary | ICD-10-CM | POA: Diagnosis not present

## 2021-10-18 DIAGNOSIS — D45 Polycythemia vera: Secondary | ICD-10-CM | POA: Diagnosis not present

## 2021-10-18 DIAGNOSIS — E559 Vitamin D deficiency, unspecified: Secondary | ICD-10-CM | POA: Diagnosis not present

## 2021-10-18 DIAGNOSIS — D631 Anemia in chronic kidney disease: Secondary | ICD-10-CM | POA: Diagnosis not present

## 2021-10-18 DIAGNOSIS — C4490 Unspecified malignant neoplasm of skin, unspecified: Secondary | ICD-10-CM | POA: Diagnosis not present

## 2021-10-18 DIAGNOSIS — E875 Hyperkalemia: Secondary | ICD-10-CM | POA: Diagnosis not present

## 2021-10-18 DIAGNOSIS — N1832 Chronic kidney disease, stage 3b: Secondary | ICD-10-CM | POA: Diagnosis not present

## 2021-10-18 DIAGNOSIS — E871 Hypo-osmolality and hyponatremia: Secondary | ICD-10-CM | POA: Diagnosis not present

## 2021-10-18 DIAGNOSIS — F39 Unspecified mood [affective] disorder: Secondary | ICD-10-CM | POA: Diagnosis not present

## 2021-10-18 NOTE — Telephone Encounter (Signed)
Spoke to pt Tuesday, 7/18 and adv her to call [facility] to request more medical supplies as she is an established patient with them. They will usually ship out what she is in need of if it is within the date range where insurance will pay for it. If it's not, they will normally wait until it renews again (every 30 days) and ship it then. Pt conveyed understanding.

## 2021-10-19 ENCOUNTER — Telehealth: Payer: Self-pay | Admitting: *Deleted

## 2021-10-19 ENCOUNTER — Ambulatory Visit (INDEPENDENT_AMBULATORY_CARE_PROVIDER_SITE_OTHER): Payer: Medicare Other | Admitting: Student

## 2021-10-19 DIAGNOSIS — S81811A Laceration without foreign body, right lower leg, initial encounter: Secondary | ICD-10-CM | POA: Diagnosis not present

## 2021-10-19 DIAGNOSIS — N1832 Chronic kidney disease, stage 3b: Secondary | ICD-10-CM | POA: Diagnosis not present

## 2021-10-19 DIAGNOSIS — C4490 Unspecified malignant neoplasm of skin, unspecified: Secondary | ICD-10-CM | POA: Diagnosis not present

## 2021-10-19 DIAGNOSIS — S81811D Laceration without foreign body, right lower leg, subsequent encounter: Secondary | ICD-10-CM | POA: Diagnosis not present

## 2021-10-19 DIAGNOSIS — F39 Unspecified mood [affective] disorder: Secondary | ICD-10-CM | POA: Diagnosis not present

## 2021-10-19 DIAGNOSIS — D631 Anemia in chronic kidney disease: Secondary | ICD-10-CM | POA: Diagnosis not present

## 2021-10-19 DIAGNOSIS — D45 Polycythemia vera: Secondary | ICD-10-CM | POA: Diagnosis not present

## 2021-10-19 NOTE — Progress Notes (Signed)
Referring Provider Glean Hess, MD [address] [place],  [place] [address]   CC:  Chief Complaint  Patient presents with   Follow-up      Laura Wilcox is an 88 y.o. female.  HPI: Patient is a 88 year old female with history of a skin tear to the lower leg.  She presents today for follow-up.  Patient was seen by Dr. Marla Roe on 10/03/2021.  The wound was approximately 3 x 11 cm.  Plan was for patient to continue Xeroform until endoform was delivered to the patient.  Patient was to wash the wound with Vashe whenever the endoform is applied.  The endoform should be applied every 2 to 3 days as it gets incorporated.  The gauze dressing can be changed daily.  Today the patient is accompanied by her daughter at the bedside.  Patient states she is doing well.  Patient states her foot is a little swollen.  Patient's daughter reports that patient has been doing well.  She states that home health care has been applying the endoform a few times a week.  She denies any issues with the wound.  Patient's daughter does report that the patient has pain sometimes when she moves her leg.  Patient denies any fevers or chills.  Patient's daughter was also inquiring about sutures that were placed in the emergency room.  Review of Systems General: Denies fevers, chills  Physical Exam    10/03/2021   10:48 AM 09/25/2021   12:18 PM 09/25/2021   11:11 AM  Vitals with BMI  Systolic 115 726 92  Diastolic 63 81 80  Pulse 76 92 94    General:  No acute distress,  Alert and oriented, Non-Toxic, Normal speech and affect  Chaperone present on exam.  On exam, patient is sitting upright in no acute distress.  There is some mild swelling to the right foot.  Pedal pulse is palpable and there is good cap refill. The endoform appears to be incorporating into the wound on the lateral side.  There is endoform noted to the medial side.  There is no surrounding erythema.  There is no malodor or  drainage noted on exam.   Assessment/Plan Skin tear of lower leg without complication, right, subsequent encounter   Adaptic and a small amount of K-Y jelly was placed over the wound.  The wound was then dressed with ABD pad, Kerlix and Ace wrap.  Discussed with the patient and the patient's daughter to continue the endoform applications.  I also discussed with the patient's daughter that the home health nurse can place an Adaptic with a small amount of K-Y jelly over the endoform.  Plan for ABD pad, Kerlix and Ace wrap to be changed every other day.  Discussed with the patient and the patient's daughter that the Ace wrap should begin at the foot and be wrapped up to the knee. Also recommended that the patient keep her foot elevated.   Updated instructions will be faxed to the home health care agency.  It appears per the emergency room note that the patient had nylon sutures placed at her admission.  I discussed with the patient's daughter that we will plan to remove these at a later time after the wound has healed a little more.  Instructed patient's daughter to call us if she has any questions or concerns.  Picture was obtained of the patient and placed in the patient's chart with the patient's permission.  Patient to follow-up in  2 weeks.  Objective findings and plan discussed with Dr. Staci Acosta 10/19/2021, 3:30 PM

## 2021-10-19 NOTE — Telephone Encounter (Signed)
Received on (10/15/21) via of fax [place] from [facility].  Requesting signature,date,and return.  Given to provider to sign,date, and return.    Home Health Certification and Plan of Care was signed,dated,and faxed back to [facility].  Confirmation received and copy scanned into the chart.//AB/CMA

## 2021-10-22 ENCOUNTER — Inpatient Hospital Stay: Payer: Medicare Other

## 2021-10-22 ENCOUNTER — Other Ambulatory Visit: Payer: Self-pay

## 2021-10-22 ENCOUNTER — Inpatient Hospital Stay (HOSPITAL_BASED_OUTPATIENT_CLINIC_OR_DEPARTMENT_OTHER): Payer: Medicare Other | Admitting: Oncology

## 2021-10-22 ENCOUNTER — Encounter: Payer: Self-pay | Admitting: Oncology

## 2021-10-22 VITALS — BP 106/76 | HR 94 | Temp 96.7°F | Resp 18 | Wt 116.0 lb

## 2021-10-22 DIAGNOSIS — Z818 Family history of other mental and behavioral disorders: Secondary | ICD-10-CM | POA: Diagnosis not present

## 2021-10-22 DIAGNOSIS — N1832 Chronic kidney disease, stage 3b: Secondary | ICD-10-CM | POA: Diagnosis not present

## 2021-10-22 DIAGNOSIS — Z881 Allergy status to other antibiotic agents status: Secondary | ICD-10-CM | POA: Diagnosis not present

## 2021-10-22 DIAGNOSIS — Z7982 Long term (current) use of aspirin: Secondary | ICD-10-CM | POA: Insufficient documentation

## 2021-10-22 DIAGNOSIS — D7581 Myelofibrosis: Secondary | ICD-10-CM | POA: Diagnosis not present

## 2021-10-22 DIAGNOSIS — Z803 Family history of malignant neoplasm of breast: Secondary | ICD-10-CM | POA: Insufficient documentation

## 2021-10-22 DIAGNOSIS — Z79899 Other long term (current) drug therapy: Secondary | ICD-10-CM | POA: Insufficient documentation

## 2021-10-22 DIAGNOSIS — C44329 Squamous cell carcinoma of skin of other parts of face: Secondary | ICD-10-CM

## 2021-10-22 DIAGNOSIS — Z85828 Personal history of other malignant neoplasm of skin: Secondary | ICD-10-CM | POA: Diagnosis not present

## 2021-10-22 DIAGNOSIS — Z87440 Personal history of urinary (tract) infections: Secondary | ICD-10-CM | POA: Diagnosis not present

## 2021-10-22 DIAGNOSIS — Z923 Personal history of irradiation: Secondary | ICD-10-CM | POA: Insufficient documentation

## 2021-10-22 DIAGNOSIS — F39 Unspecified mood [affective] disorder: Secondary | ICD-10-CM | POA: Diagnosis not present

## 2021-10-22 DIAGNOSIS — D45 Polycythemia vera: Secondary | ICD-10-CM | POA: Insufficient documentation

## 2021-10-22 DIAGNOSIS — C4490 Unspecified malignant neoplasm of skin, unspecified: Secondary | ICD-10-CM | POA: Diagnosis not present

## 2021-10-22 DIAGNOSIS — D61818 Other pancytopenia: Secondary | ICD-10-CM | POA: Diagnosis not present

## 2021-10-22 DIAGNOSIS — R531 Weakness: Secondary | ICD-10-CM | POA: Insufficient documentation

## 2021-10-22 DIAGNOSIS — S81811A Laceration without foreign body, right lower leg, initial encounter: Secondary | ICD-10-CM | POA: Diagnosis not present

## 2021-10-22 DIAGNOSIS — D631 Anemia in chronic kidney disease: Secondary | ICD-10-CM | POA: Diagnosis not present

## 2021-10-22 DIAGNOSIS — Z8249 Family history of ischemic heart disease and other diseases of the circulatory system: Secondary | ICD-10-CM | POA: Diagnosis not present

## 2021-10-22 DIAGNOSIS — F0390 Unspecified dementia without behavioral disturbance: Secondary | ICD-10-CM | POA: Diagnosis not present

## 2021-10-22 DIAGNOSIS — R4189 Other symptoms and signs involving cognitive functions and awareness: Secondary | ICD-10-CM | POA: Diagnosis not present

## 2021-10-22 DIAGNOSIS — Z5111 Encounter for antineoplastic chemotherapy: Secondary | ICD-10-CM

## 2021-10-22 LAB — COMPREHENSIVE METABOLIC PANEL
ALT: 11 U/L (ref 0–44)
AST: 18 U/L (ref 15–41)
Albumin: 3.6 g/dL (ref 3.5–5.0)
Alkaline Phosphatase: 98 U/L (ref 38–126)
Anion gap: 4 — ABNORMAL LOW (ref 5–15)
BUN: 31 mg/dL — ABNORMAL HIGH (ref 8–23)
CO2: 25 mmol/L (ref 22–32)
Calcium: 8.5 mg/dL — ABNORMAL LOW (ref 8.9–10.3)
Chloride: 106 mmol/L (ref 98–111)
Creatinine, Ser: 1.43 mg/dL — ABNORMAL HIGH (ref 0.44–1.00)
GFR, Estimated: 34 mL/min — ABNORMAL LOW (ref >60)
Glucose, Bld: 190 mg/dL — ABNORMAL HIGH (ref 70–99)
Potassium: 4.2 mmol/L (ref 3.5–5.1)
Sodium: 135 mmol/L (ref 135–145)
Total Bilirubin: 0.4 mg/dL (ref 0.3–1.2)
Total Protein: 7 g/dL (ref 6.5–8.1)

## 2021-10-22 LAB — CBC WITH DIFFERENTIAL/PLATELET
Abs Immature Granulocytes: 0.63 10*3/uL — ABNORMAL HIGH (ref 0.00–0.07)
Basophils Absolute: 0.2 10*3/uL — ABNORMAL HIGH (ref 0.0–0.1)
Basophils Relative: 2 %
Eosinophils Absolute: 0.6 10*3/uL — ABNORMAL HIGH (ref 0.0–0.5)
Eosinophils Relative: 6 %
HCT: 34.3 % — ABNORMAL LOW (ref 36.0–46.0)
Hemoglobin: 10.5 g/dL — ABNORMAL LOW (ref 12.0–15.0)
Immature Granulocytes: 6 %
Lymphocytes Relative: 16 %
Lymphs Abs: 1.8 10*3/uL (ref 0.7–4.0)
MCH: 28.2 pg (ref 26.0–34.0)
MCHC: 30.6 g/dL (ref 30.0–36.0)
MCV: 92 fL (ref 80.0–100.0)
Monocytes Absolute: 0.8 10*3/uL (ref 0.1–1.0)
Monocytes Relative: 7 %
Neutro Abs: 7 10*3/uL (ref 1.7–7.7)
Neutrophils Relative %: 63 %
Platelets: 744 10*3/uL — ABNORMAL HIGH (ref 150–400)
RBC: 3.73 MIL/uL — ABNORMAL LOW (ref 3.87–5.11)
RDW: 20.3 % — ABNORMAL HIGH (ref 11.5–15.5)
Smear Review: INCREASED
WBC: 11.1 10*3/uL — ABNORMAL HIGH (ref 4.0–10.5)
nRBC: 0 % (ref 0.0–0.2)

## 2021-10-22 LAB — TECHNOLOGIST SMEAR REVIEW

## 2021-10-22 NOTE — Telephone Encounter (Signed)
Faxed order, demographics and ov notes to [facility]. Received confirmation fax same day @ 4:42 pm. Original order including order to [facility] forwarded to [facility] for batch scanning.

## 2021-10-22 NOTE — Progress Notes (Signed)
Hematology/Oncology Progress note [phone] [phone]      Clinic Day:  10/22/2021   Referring physician: Glean Hess, MD  Chief Complaint: Laura Wilcox is a 88 y.o. female with polycythemia vera (PV) and secondary myelofibrosis presents for follow up   Laura Wilcox is a 88 y.o.afemale who has above oncology history reviewed by me today presented for follow up visit for management of  polycythemia vera  Patient previously followed up by Dr.Corcoran, patient switched care to me on 11/16/20 Extensive medical record review was performed by me  Feb 2014 polycythemia vera and secondary myelofibrosis.  She presented in 04/2002 with thrombocytosis (835,000).  JAK2 V617F was positive   Bone marrow was on 04/15/2002 revealed a normocellular marrow for age with trilineage hematopoiesis and mild megakaryocytic hyperplasia with focal atypia.  There was no morphologic evidence of a myeloproliferative disorder.  Iron stains were inadequate for evaluation of storage iron.  There was no increase in reticulin fibers.  She was started on Agrylin in 2012  Bone marrow on 09/25/2012 revealed persistent myeloproliferative disorder s/p Agrylin.  The current features were best classified as persistent myeloproliferative neoplasm (MPN) JAK2 V617F mutation positive.  The current features of markedly increased atypical megakaryocytes paresis, with frequent clustering with frequent hyper/deeply lobulated forms, bizarre forms and forms with staghorn nuclei, hyperchromatic forms and abundant cytoplasm was highly suggestive of a myeloproliferative neoplasm.  Marrow was hypercellular for age (30-50%) with increased trilineage hematopoiesis and markedly increased atypical megakaryopoiesis with frequent clustering.  There were no increased blasts.  Iron stores were absent.  There was moderate to severe myelofibrosis (grade MF 2-3).  Differential includes ET, PV,  primary myelofibrosis, and MPN, unclassifiable.  Cytogenetics were normal (45, XX). She developed pancytopenia in 12/2015.  Etiology is unclear (medication confusion or hydroxyurea + Agrylin).   She received Procrit 40,000 units (intermittently 09/2012 - 06/2016), Venofer (09/2012 - 10/2012), and Infed (12/2015 - 03/2016).  She received Neupogen during a period of neutropenia.   She was anagrelide from 2004 - 08/13/2017.  She began [place] on 08/14/2017.  Jakafi was increased from 5 mg BID to 10 mg BID on 06/17/2018.  Jakafi was increased to 15 mg BID on 08/03/2018.  She has been back on Jakafi 5 mg BID since 02/01/2019 then 10 mg BID on 05/25/2019.  Laura Wilcox was on hold from 12/20/2019 - 01/12/2020.  Jakafi 5 mg twice daily restarted on 01/12/2020 then increased to 10 mg in the morning and 5 mg in the evening on 02/02/2020. Laura Wilcox is currently 20 mg po BID.  She has received Retacrit to maintain a hemoglobin > 10 (12/24/2019 and 03   Squamous cell carcinoma left cheek- s/p cryotherapy and debridement. Managed by Dr. Nehemiah Wilcox  10/22/2020- 10/26/2020 Hospitalization due to acute metabolic encephalopathy, UTI,E. coli bacteremia AKI on CKD, acute on chronic anemia. Laura Wilcox was held during the admission. She was discharged and spent a short period of time in rehab. Laura Wilcox was held.   11/06/2020 seen by symptom management for weakness.  11/16/2020 established care with me. Resumed Jakafi at '10mg'$  BID  #Dementia/cognitive impairment- patient has hx of dementia. Per daughter, it has been difficult to convince patent to see neurology for further evaluation. She is not on any medication for her memory loss.  She has been referred to home health.  06/25/2021 - 07/02/2021,Patient was hospitalized due to UTI, generalized weakness Jakafi was.  Held. 07/25/2021, resumed on Jakafi 5 mg daily.  07/25/2021, bilateral lower extremity ultrasound were  negative for DVT. 08/20/2021, increased Jakafi to 5 mg twice daily. INTERVAL  HISTORY Laura Wilcox is a 88 y.o. female who has above history reviewed by me today presents for follow up visit for management of polycythemia vera and secondary myelofibrosis.   Patient was accompanied by daughter Laura Wilcox  Patient history of squamous cell carcinoma on her left mid cheek\ Patient is a poor historian.  She currently lives in a facility.  Past Medical History:  Diagnosis Date   Acute metabolic encephalopathy 40/97/3532   Confusion 12/30/2018   Polycythemia    SCC (squamous cell carcinoma) 04/11/2021   R med dorsum hand - ED&C SCCIS   SCC (squamous cell carcinoma) 10/09/2021   left lateral eye, EDC   Squamous cell carcinoma of skin 09/06/2020   R thumb - ED&C   Urinary tract infection without hematuria 01/02/2020   Urticaria 06/02/2017    Past Surgical History:  Procedure Laterality Date   ABDOMINAL HYSTERECTOMY     complete   BREAST SURGERY      Family History  Problem Relation Age of Onset   Heart disease Father    Cancer Sister        breast   Cancer Sister        breast   Alzheimer's disease Sister     Social History:  reports that she has never smoked. She has never used smokeless tobacco. She reports that she does not drink alcohol and does not use drugs.   Allergies:  Allergies  Allergen Reactions   Bactrim [Sulfamethoxazole-Trimethoprim]     Due to kidney disease    Epinephrine Other (See Comments)    Current Medications: Current Outpatient Medications  Medication Sig Dispense Refill   acetaminophen (TYLENOL) 325 MG tablet Take 325-650 mg by mouth every 6 (six) hours as needed for mild pain or moderate pain.     aspirin EC 81 MG tablet Take 81 mg by mouth daily. Swallow whole.     calcitRIOL (ROCALTROL) 0.25 MCG capsule Take 0.25 mcg by mouth every Monday, Wednesday, and Friday.     citalopram (CELEXA) 10 MG tablet Take 1 tablet (10 mg total) by mouth daily. 30 tablet 2   mupirocin ointment (BACTROBAN) 2 % Apply to wound every other day  22 g 1   Patiromer Sorbitex Calcium (VELTASSA) 16.8 g PACK Take 16.8 g by mouth daily.     polyethylene glycol (MIRALAX / GLYCOLAX) 17 g packet Take 17 g by mouth daily as needed for mild constipation. 14 each 0   ruxolitinib phosphate (JAKAFI) 5 MG tablet Take 1 tablet (5 mg total) by mouth See admin instructions. Take '10mg'$  in morning and '5mg'$  at night. 90 tablet 0   vitamin B-12 (CYANOCOBALAMIN) 1000 MCG tablet TAKE 1 TABLET BY MOUTH DAILY 30 tablet 1   feeding supplement (ENSURE ENLIVE / ENSURE PLUS) LIQD Take 237 mLs by mouth 2 (two) times daily between meals. (Patient not taking: Reported on 10/22/2021) 237 mL 12   No current facility-administered medications for this visit.    Review of Systems  Unable to perform ROS: Dementia    Performance status (ECOG):  2  Vitals Blood pressure 106/76, pulse 94, temperature (!) 96.7 F (35.9 C), resp. rate 18, weight 116 lb (52.6 kg).   Physical Exam Vitals reviewed.  Constitutional:      General: She is not in acute distress.    Appearance: She is well-developed.     Comments: Frail appearance, patient sits in the wheelchair  HENT:  Head: Normocephalic and atraumatic.     Nose: Nose normal. No congestion.     Mouth/Throat:     Pharynx: No oropharyngeal exudate.  Eyes:     General: No scleral icterus.    Conjunctiva/sclera: Conjunctivae normal.  Cardiovascular:     Rate and Rhythm: Normal rate and regular rhythm.     Pulses: Normal pulses.     Heart sounds: No murmur heard. Pulmonary:     Effort: Pulmonary effort is normal. No respiratory distress.  Abdominal:     General: There is no distension.     Tenderness: There is no abdominal tenderness.  Musculoskeletal:        General: No deformity. Normal range of motion.     Right lower leg: Edema present.     Left lower leg: No edema.  Skin:    General: Skin is warm and dry.     Coloration: Skin is not pale.     Findings: Bruising present.     Comments: Left cheek skin lesion   Neurological:     Mental Status: She is alert. Mental status is at baseline.  Psychiatric:        Attention and Perception: Attention normal.        Mood and Affect: Mood and affect normal.        Behavior: Behavior is cooperative.        Cognition and Memory: Cognition is impaired. Memory is impaired.     Orders Only on 10/22/2021  Component Date Value Ref Range Status   Sodium 10/22/2021 135  135 - 145 mmol/L Final   Potassium 10/22/2021 4.2  3.5 - 5.1 mmol/L Final   Chloride 10/22/2021 106  98 - 111 mmol/L Final   CO2 10/22/2021 25  22 - 32 mmol/L Final   Glucose, Bld 10/22/2021 190 (H)  70 - 99 mg/dL Final   Glucose reference range applies only to samples taken after fasting for at least 8 hours.   BUN 10/22/2021 31 (H)  8 - 23 mg/dL Final   Creatinine, Ser 10/22/2021 1.43 (H)  0.44 - 1.00 mg/dL Final   Calcium 10/22/2021 8.5 (L)  8.9 - 10.3 mg/dL Final   Total Protein 10/22/2021 7.0  6.5 - 8.1 g/dL Final   Albumin 10/22/2021 3.6  3.5 - 5.0 g/dL Final   AST 10/22/2021 18  15 - 41 U/L Final   ALT 10/22/2021 11  0 - 44 U/L Final   Alkaline Phosphatase 10/22/2021 98  38 - 126 U/L Final   Total Bilirubin 10/22/2021 0.4  0.3 - 1.2 mg/dL Final   GFR, Estimated 10/22/2021 34 (L)  >60 mL/min Final   Comment: (NOTE) Calculated using the CKD-EPI Creatinine Equation (2021)    Anion gap 10/22/2021 4 (L)  5 - 15 Final   Performed at [facility], [place], [place] [address]   WBC 10/22/2021 11.1 (H)  4.0 - 10.5 K/uL Final   RBC 10/22/2021 3.73 (L)  3.87 - 5.11 MIL/uL Final   Hemoglobin 10/22/2021 10.5 (L)  12.0 - 15.0 g/dL Final   HCT 10/22/2021 34.3 (L)  36.0 - 46.0 % Final   MCV 10/22/2021 92.0  80.0 - 100.0 fL Final   MCH 10/22/2021 28.2  26.0 - 34.0 pg Final   MCHC 10/22/2021 30.6  30.0 - 36.0 g/dL Final   RDW 10/22/2021 20.3 (H)  11.5 - 15.5 % Final   Platelets 10/22/2021 744 (H)  150 - 400 K/uL Final   nRBC  10/22/2021 0.0  0.0 - 0.2 % Final    Neutrophils Relative % 10/22/2021 63  % Final   Neutro Abs 10/22/2021 7.0  1.7 - 7.7 K/uL Final   Lymphocytes Relative 10/22/2021 16  % Final   Lymphs Abs 10/22/2021 1.8  0.7 - 4.0 K/uL Final   Monocytes Relative 10/22/2021 7  % Final   Monocytes Absolute 10/22/2021 0.8  0.1 - 1.0 K/uL Final   Eosinophils Relative 10/22/2021 6  % Final   Eosinophils Absolute 10/22/2021 0.6 (H)  0.0 - 0.5 K/uL Final   Basophils Relative 10/22/2021 2  % Final   Basophils Absolute 10/22/2021 0.2 (H)  0.0 - 0.1 K/uL Final   WBC Morphology 10/22/2021 MILD LEFT SHIFT (1-5% METAS, OCC MYELO, OCC BANDS)   Final   DIFF CONFIRMED BY MANUAL   RBC Morphology 10/22/2021 MIXED RBC POPULATION   Final   Smear Review 10/22/2021 PLATELETS APPEAR INCREASED   Final   PLATELETS VARY IN SIZE AND GRANULATION   Immature Granulocytes 10/22/2021 6  % Final   Abs Immature Granulocytes 10/22/2021 0.63 (H)  0.00 - 0.07 K/uL Final   Giant PLTs 10/22/2021 PRESENT   Final   Performed at [facility], [address] [place], [place] [address]   WBC MORPHOLOGY 10/22/2021 MILD LEFT SHIFT (1-5% METAS, OCC MYELO, OCC BANDS)   Final   RBC MORPHOLOGY 10/22/2021 MIXED RBC POPULATION   Final   Tech Review 10/22/2021 PLATELETS VARY IN SIZE AND GRANULATION   Final   Comment: GIANT PLATELETS SEEN PLATELETS APPEAR INCREASED Performed at [facility], [place], [place] [address]    [facility]     Component Value Date/Time   NA 135 10/22/2021 1310   NA 134 06/18/2021 1514   K 4.2 10/22/2021 1310   CL 106 10/22/2021 1310   CO2 25 10/22/2021 1310   GLUCOSE 190 (H) 10/22/2021 1310   BUN 31 (H) 10/22/2021 1310   BUN 19 06/18/2021 1514   CREATININE 1.43 (H) 10/22/2021 1310   CALCIUM 8.5 (L) 10/22/2021 1310   PROT 7.0 10/22/2021 1310   PROT 5.8 (L) 06/18/2021 1514   ALBUMIN 3.6 10/22/2021 1310   ALBUMIN 3.6 06/18/2021 1514   AST 18 10/22/2021 1310   ALT 11 10/22/2021 1310   ALKPHOS 98 10/22/2021 1310   BILITOT 0.4  10/22/2021 1310   BILITOT 0.3 06/18/2021 1514   GFRNONAA 34 (L) 10/22/2021 1310   GFRAA 49 (L) 12/27/2019 0935    Assessment and Plan.  1. Squamous cell cancer of skin of left cheek   2. Other pancytopenia ([place])   3. Encounter for antineoplastic chemotherapy   4. Polycythemia vera ([place])   5. Secondary myelofibrosis ([place])    # Polycythemia vera and  secondary myelofibrosis. Clinically patient is stable. Continue current regimen with Jakafi 10 mg in the morning, 5 mg in the evening.   Continue aspirin 81 mg daily.  .# Anemia due to CKD, and myelofibrosis.   Hemoglobin has improved.  No need for Retacrit.  #Squamous cell carcinoma of the skin.  Status post radiation.  Offered patient for home health for medication management.  Daughter and patient preferred to defer home health at this point.  Follow up in 8 weeks.   Earlie Server, MD, PhD 10/22/2021

## 2021-10-22 NOTE — Progress Notes (Signed)
Pt here for follow up. No new concerns voiced.   

## 2021-10-23 DIAGNOSIS — N1832 Chronic kidney disease, stage 3b: Secondary | ICD-10-CM | POA: Diagnosis not present

## 2021-10-23 DIAGNOSIS — D45 Polycythemia vera: Secondary | ICD-10-CM | POA: Diagnosis not present

## 2021-10-23 DIAGNOSIS — D631 Anemia in chronic kidney disease: Secondary | ICD-10-CM | POA: Diagnosis not present

## 2021-10-23 DIAGNOSIS — F39 Unspecified mood [affective] disorder: Secondary | ICD-10-CM | POA: Diagnosis not present

## 2021-10-23 DIAGNOSIS — C4490 Unspecified malignant neoplasm of skin, unspecified: Secondary | ICD-10-CM | POA: Diagnosis not present

## 2021-10-23 DIAGNOSIS — S81811A Laceration without foreign body, right lower leg, initial encounter: Secondary | ICD-10-CM | POA: Diagnosis not present

## 2021-10-24 ENCOUNTER — Telehealth: Payer: Self-pay

## 2021-10-24 NOTE — Telephone Encounter (Signed)
Per fax from [facility]: Pt is currently active in a [place] episode. [facility] has forwarded the order to the [facility] to service the patient with their supply needs. Will send to scan.

## 2021-10-25 DIAGNOSIS — E441 Mild protein-calorie malnutrition: Secondary | ICD-10-CM | POA: Diagnosis not present

## 2021-10-25 DIAGNOSIS — C4339 Malignant melanoma of other parts of face: Secondary | ICD-10-CM | POA: Diagnosis not present

## 2021-10-25 DIAGNOSIS — Z681 Body mass index (BMI) 19 or less, adult: Secondary | ICD-10-CM | POA: Diagnosis not present

## 2021-10-25 DIAGNOSIS — F02B0 Dementia in other diseases classified elsewhere, moderate, without behavioral disturbance, psychotic disturbance, mood disturbance, and anxiety: Secondary | ICD-10-CM | POA: Diagnosis not present

## 2021-10-25 DIAGNOSIS — G301 Alzheimer's disease with late onset: Secondary | ICD-10-CM | POA: Diagnosis not present

## 2021-10-25 DIAGNOSIS — R269 Unspecified abnormalities of gait and mobility: Secondary | ICD-10-CM | POA: Diagnosis not present

## 2021-10-26 DIAGNOSIS — D631 Anemia in chronic kidney disease: Secondary | ICD-10-CM | POA: Diagnosis not present

## 2021-10-26 DIAGNOSIS — N1832 Chronic kidney disease, stage 3b: Secondary | ICD-10-CM | POA: Diagnosis not present

## 2021-10-26 DIAGNOSIS — C4490 Unspecified malignant neoplasm of skin, unspecified: Secondary | ICD-10-CM | POA: Diagnosis not present

## 2021-10-26 DIAGNOSIS — F39 Unspecified mood [affective] disorder: Secondary | ICD-10-CM | POA: Diagnosis not present

## 2021-10-26 DIAGNOSIS — S81811A Laceration without foreign body, right lower leg, initial encounter: Secondary | ICD-10-CM | POA: Diagnosis not present

## 2021-10-26 DIAGNOSIS — E782 Mixed hyperlipidemia: Secondary | ICD-10-CM | POA: Diagnosis not present

## 2021-10-26 DIAGNOSIS — D45 Polycythemia vera: Secondary | ICD-10-CM | POA: Diagnosis not present

## 2021-10-26 DIAGNOSIS — D531 Other megaloblastic anemias, not elsewhere classified: Secondary | ICD-10-CM | POA: Diagnosis not present

## 2021-10-29 DIAGNOSIS — N1832 Chronic kidney disease, stage 3b: Secondary | ICD-10-CM | POA: Diagnosis not present

## 2021-10-29 DIAGNOSIS — D631 Anemia in chronic kidney disease: Secondary | ICD-10-CM | POA: Diagnosis not present

## 2021-10-29 DIAGNOSIS — S81811A Laceration without foreign body, right lower leg, initial encounter: Secondary | ICD-10-CM | POA: Diagnosis not present

## 2021-10-29 DIAGNOSIS — C4490 Unspecified malignant neoplasm of skin, unspecified: Secondary | ICD-10-CM | POA: Diagnosis not present

## 2021-10-29 DIAGNOSIS — D45 Polycythemia vera: Secondary | ICD-10-CM | POA: Diagnosis not present

## 2021-10-29 DIAGNOSIS — F39 Unspecified mood [affective] disorder: Secondary | ICD-10-CM | POA: Diagnosis not present

## 2021-10-30 ENCOUNTER — Ambulatory Visit (INDEPENDENT_AMBULATORY_CARE_PROVIDER_SITE_OTHER): Payer: Medicare Other | Admitting: Physician Assistant

## 2021-10-30 DIAGNOSIS — S81811D Laceration without foreign body, right lower leg, subsequent encounter: Secondary | ICD-10-CM

## 2021-10-30 NOTE — Progress Notes (Signed)
Patient is a 88 year old female with history of a skin tear to the lower leg.  She presents today for follow-up.

## 2021-10-30 NOTE — Progress Notes (Signed)
   Referring Provider Glean Hess, MD [address] [place],  [place] [address]   CC:  Chief Complaint  Patient presents with   Follow-up      Laura Wilcox is an 88 y.o. female.  HPI: This is a 88 year old female seen in our office for follow-up of skin tear to lower leg.  The patient was most recently seen in our office on 10/19/2021, she has been followed in our office for wound care advice since 10/03/2021.  At that time the wound was approximately 3 x 11 cm, the patient was started on a wound care regimen of endoform with Adaptic gauze and K-Y jelly.  The patient's daughter is at bedside who notes that she is uncertain how often they are changing the dressing and applying the endoform.  She notes that they have not been applying the Adaptic and K-Y jelly over top of the endoform.  Overall she notes her mother's been doing well, the patient notes she has been doing well with no signs of systemic illness including fever chills nausea or vomiting, no surrounding redness or discharge.  Review of Systems General: Negative for fevers  Physical Exam    10/22/2021    1:23 PM 10/03/2021   10:48 AM 09/25/2021   12:18 PM  Vitals with BMI  Weight 119 lbs    Systolic [phone]  Diastolic 76 63 81  Pulse 94 76 92    General:  No acute distress,  Alert and oriented, Non-Toxic, Normal speech and affect Right shin with endoform layered, dried, with no surrounding discharge, redness, warmth      Assessment/Plan Laura Wilcox was seen in our clinic today for follow-up evaluation.  Overall she is doing well.  Does appear that they have been laboring her endoform over the wound consistently, but not allowing for time for incorporation.  I did remove the dried endoform off the wound which revealed a very well appearing wound bed with granulation tissue and clean edges.  She has no signs of infection.  I did write a prescription for specific instructions for wound care which include  twice weekly endoform placement but only if the endoform has incorporated into the wound, if it has not then they may apply Adaptic dressing with K-Y jelly in lieu of that.  Otherwise intact dressing with K-Y jelly every other day.  I would like to see the patient back in the office in 2 weeks for reevaluation to make sure the progress continues.  I gave the patient and her daughter who is at the clinic today strict return precautions, they verbalized understanding and agreement to today's plan had no further questions or concerns.  Stevie Kern Daeron Carreno 10/30/2021, 3:06 PM

## 2021-10-31 ENCOUNTER — Telehealth: Payer: Self-pay

## 2021-10-31 NOTE — Telephone Encounter (Signed)
Joycelyn Schmid, RN from [facility] called for clarification regarding wound dressing orders. The nurse that is normally assigned to pt is on vacation and Joycelyn Schmid is filling in for her and wanted to make sure she understood what you wanted for pt regarding the endoform. She stated that she has a phone meeting at 10:30 am but would definitely call you back if she missed your call.   Call back # is [phone]  Thanks!

## 2021-11-03 DIAGNOSIS — S81811A Laceration without foreign body, right lower leg, initial encounter: Secondary | ICD-10-CM | POA: Diagnosis not present

## 2021-11-03 DIAGNOSIS — C4490 Unspecified malignant neoplasm of skin, unspecified: Secondary | ICD-10-CM | POA: Diagnosis not present

## 2021-11-03 DIAGNOSIS — D45 Polycythemia vera: Secondary | ICD-10-CM | POA: Diagnosis not present

## 2021-11-03 DIAGNOSIS — N1832 Chronic kidney disease, stage 3b: Secondary | ICD-10-CM | POA: Diagnosis not present

## 2021-11-03 DIAGNOSIS — D631 Anemia in chronic kidney disease: Secondary | ICD-10-CM | POA: Diagnosis not present

## 2021-11-03 DIAGNOSIS — F39 Unspecified mood [affective] disorder: Secondary | ICD-10-CM | POA: Diagnosis not present

## 2021-11-05 DIAGNOSIS — D45 Polycythemia vera: Secondary | ICD-10-CM | POA: Diagnosis not present

## 2021-11-05 DIAGNOSIS — S81811A Laceration without foreign body, right lower leg, initial encounter: Secondary | ICD-10-CM | POA: Diagnosis not present

## 2021-11-05 DIAGNOSIS — C4490 Unspecified malignant neoplasm of skin, unspecified: Secondary | ICD-10-CM | POA: Diagnosis not present

## 2021-11-05 DIAGNOSIS — D631 Anemia in chronic kidney disease: Secondary | ICD-10-CM | POA: Diagnosis not present

## 2021-11-05 DIAGNOSIS — N1832 Chronic kidney disease, stage 3b: Secondary | ICD-10-CM | POA: Diagnosis not present

## 2021-11-05 DIAGNOSIS — F39 Unspecified mood [affective] disorder: Secondary | ICD-10-CM | POA: Diagnosis not present

## 2021-11-06 DIAGNOSIS — C4490 Unspecified malignant neoplasm of skin, unspecified: Secondary | ICD-10-CM | POA: Diagnosis not present

## 2021-11-06 DIAGNOSIS — S81811A Laceration without foreign body, right lower leg, initial encounter: Secondary | ICD-10-CM | POA: Diagnosis not present

## 2021-11-06 DIAGNOSIS — N1832 Chronic kidney disease, stage 3b: Secondary | ICD-10-CM | POA: Diagnosis not present

## 2021-11-06 DIAGNOSIS — F39 Unspecified mood [affective] disorder: Secondary | ICD-10-CM | POA: Diagnosis not present

## 2021-11-06 DIAGNOSIS — D45 Polycythemia vera: Secondary | ICD-10-CM | POA: Diagnosis not present

## 2021-11-06 DIAGNOSIS — D631 Anemia in chronic kidney disease: Secondary | ICD-10-CM | POA: Diagnosis not present

## 2021-11-08 DIAGNOSIS — C4490 Unspecified malignant neoplasm of skin, unspecified: Secondary | ICD-10-CM | POA: Diagnosis not present

## 2021-11-08 DIAGNOSIS — D631 Anemia in chronic kidney disease: Secondary | ICD-10-CM | POA: Diagnosis not present

## 2021-11-08 DIAGNOSIS — N1832 Chronic kidney disease, stage 3b: Secondary | ICD-10-CM | POA: Diagnosis not present

## 2021-11-08 DIAGNOSIS — F39 Unspecified mood [affective] disorder: Secondary | ICD-10-CM | POA: Diagnosis not present

## 2021-11-08 DIAGNOSIS — S81811A Laceration without foreign body, right lower leg, initial encounter: Secondary | ICD-10-CM | POA: Diagnosis not present

## 2021-11-08 DIAGNOSIS — D45 Polycythemia vera: Secondary | ICD-10-CM | POA: Diagnosis not present

## 2021-11-10 DIAGNOSIS — E440 Moderate protein-calorie malnutrition: Secondary | ICD-10-CM | POA: Diagnosis not present

## 2021-11-10 DIAGNOSIS — D631 Anemia in chronic kidney disease: Secondary | ICD-10-CM | POA: Diagnosis not present

## 2021-11-10 DIAGNOSIS — N1832 Chronic kidney disease, stage 3b: Secondary | ICD-10-CM | POA: Diagnosis not present

## 2021-11-10 DIAGNOSIS — Z9181 History of falling: Secondary | ICD-10-CM | POA: Diagnosis not present

## 2021-11-10 DIAGNOSIS — C4490 Unspecified malignant neoplasm of skin, unspecified: Secondary | ICD-10-CM | POA: Diagnosis not present

## 2021-11-10 DIAGNOSIS — F05 Delirium due to known physiological condition: Secondary | ICD-10-CM | POA: Diagnosis not present

## 2021-11-10 DIAGNOSIS — F39 Unspecified mood [affective] disorder: Secondary | ICD-10-CM | POA: Diagnosis not present

## 2021-11-10 DIAGNOSIS — D62 Acute posthemorrhagic anemia: Secondary | ICD-10-CM | POA: Diagnosis not present

## 2021-11-10 DIAGNOSIS — D45 Polycythemia vera: Secondary | ICD-10-CM | POA: Diagnosis not present

## 2021-11-10 DIAGNOSIS — N2581 Secondary hyperparathyroidism of renal origin: Secondary | ICD-10-CM | POA: Diagnosis not present

## 2021-11-10 DIAGNOSIS — F321 Major depressive disorder, single episode, moderate: Secondary | ICD-10-CM | POA: Diagnosis not present

## 2021-11-10 DIAGNOSIS — N3000 Acute cystitis without hematuria: Secondary | ICD-10-CM | POA: Diagnosis not present

## 2021-11-10 DIAGNOSIS — D7581 Myelofibrosis: Secondary | ICD-10-CM | POA: Diagnosis not present

## 2021-11-10 DIAGNOSIS — A419 Sepsis, unspecified organism: Secondary | ICD-10-CM | POA: Diagnosis not present

## 2021-11-10 DIAGNOSIS — S81811A Laceration without foreign body, right lower leg, initial encounter: Secondary | ICD-10-CM | POA: Diagnosis not present

## 2021-11-10 DIAGNOSIS — R32 Unspecified urinary incontinence: Secondary | ICD-10-CM | POA: Diagnosis not present

## 2021-11-12 ENCOUNTER — Other Ambulatory Visit (HOSPITAL_COMMUNITY): Payer: Self-pay

## 2021-11-12 ENCOUNTER — Other Ambulatory Visit: Payer: Self-pay

## 2021-11-12 DIAGNOSIS — D631 Anemia in chronic kidney disease: Secondary | ICD-10-CM | POA: Diagnosis not present

## 2021-11-12 DIAGNOSIS — R300 Dysuria: Secondary | ICD-10-CM | POA: Diagnosis not present

## 2021-11-12 DIAGNOSIS — F39 Unspecified mood [affective] disorder: Secondary | ICD-10-CM | POA: Diagnosis not present

## 2021-11-12 DIAGNOSIS — C4490 Unspecified malignant neoplasm of skin, unspecified: Secondary | ICD-10-CM | POA: Diagnosis not present

## 2021-11-12 DIAGNOSIS — N1832 Chronic kidney disease, stage 3b: Secondary | ICD-10-CM | POA: Diagnosis not present

## 2021-11-12 DIAGNOSIS — S81811A Laceration without foreign body, right lower leg, initial encounter: Secondary | ICD-10-CM | POA: Diagnosis not present

## 2021-11-12 DIAGNOSIS — D45 Polycythemia vera: Secondary | ICD-10-CM | POA: Diagnosis not present

## 2021-11-13 ENCOUNTER — Encounter: Payer: Self-pay | Admitting: Physician Assistant

## 2021-11-13 ENCOUNTER — Other Ambulatory Visit (HOSPITAL_COMMUNITY): Payer: Self-pay

## 2021-11-13 ENCOUNTER — Ambulatory Visit (INDEPENDENT_AMBULATORY_CARE_PROVIDER_SITE_OTHER): Payer: Medicare Other | Admitting: Physician Assistant

## 2021-11-13 ENCOUNTER — Ambulatory Visit: Payer: PRIVATE HEALTH INSURANCE | Admitting: Student

## 2021-11-13 DIAGNOSIS — N1832 Chronic kidney disease, stage 3b: Secondary | ICD-10-CM | POA: Diagnosis not present

## 2021-11-13 DIAGNOSIS — S81811A Laceration without foreign body, right lower leg, initial encounter: Secondary | ICD-10-CM | POA: Diagnosis not present

## 2021-11-13 DIAGNOSIS — S81811D Laceration without foreign body, right lower leg, subsequent encounter: Secondary | ICD-10-CM | POA: Diagnosis not present

## 2021-11-13 DIAGNOSIS — F39 Unspecified mood [affective] disorder: Secondary | ICD-10-CM | POA: Diagnosis not present

## 2021-11-13 DIAGNOSIS — D631 Anemia in chronic kidney disease: Secondary | ICD-10-CM | POA: Diagnosis not present

## 2021-11-13 DIAGNOSIS — D45 Polycythemia vera: Secondary | ICD-10-CM | POA: Diagnosis not present

## 2021-11-13 DIAGNOSIS — C4490 Unspecified malignant neoplasm of skin, unspecified: Secondary | ICD-10-CM | POA: Diagnosis not present

## 2021-11-13 NOTE — Progress Notes (Signed)
   Referring Provider Glean Hess, MD [address] [place],  [place] [address]   CC:  Chief Complaint  Patient presents with   Follow-up      Talissa Apple is an 88 y.o. female.   HPI: This is a 88 year old female seen in our office for follow-up evaluation status post skin tear to her right lower leg.  She was most recently seen in our office on 10/30/2021.  Her wound was originally repaired in the emergency room with stitches.  She was seen in our office where it was recommended to do dressing changes with endoform.  She has tolerated this well and has formed good granulation tissue in the wound bed.  In follow-up today her daughter has come with her again to help provide more information.  She is in a nursing facility and is unsure how often they are actually changing wound as this is not documented.  The patient denies any significant complaints today, she denies any infectious symptoms.  Review of Systems General: Negative for fever  Physical Exam    10/22/2021    1:23 PM 10/03/2021   10:48 AM 09/25/2021   12:18 PM  Vitals with BMI  Weight 350 lbs    Systolic [phone]  Diastolic 76 63 81  Pulse 94 76 92    General:  No acute distress,  Alert and oriented, Non-Toxic, Normal speech and affect Right shin with endoform over wound bed, underlying granular lesion tissues, clean wound edges, no surrounding redness or discharge    Assessment/Plan This is a 88 year old female seen in our office for follow-up evaluation of wound to right lower extremity.  Overall she is doing very well, she has developed good granulation tissue.  She has no signs of infection.  She did have several remaining stitches which were removed today.  I removed the unincorporated endoform from the wound which appeared clean with no signs of infection.  We opted to place supra SDRM ( Lot number PDM [id number]) use by 10/2021 along with with Rylon 1 single-sided nonadherent wound dressing  ( Lot  [id number], ex 2024-10)  This was donated by [employer].  This will remain in place for approximately 2 weeks, we will see her back in 1 week for reevaluation.  There is no need to apply any ointment over top, I did place dry gauze well as Curlex and Ace bandage over top.  I did write a note with instructions for the nursing staff.  Both the patient and her daughter verbalized understanding and agreement to today's plan and had no further questions and concerns at today's visit.  Stevie Kern Elek Holderness 11/13/2021, 1:28 PM

## 2021-11-14 ENCOUNTER — Other Ambulatory Visit: Payer: Self-pay | Admitting: Oncology

## 2021-11-14 ENCOUNTER — Other Ambulatory Visit (HOSPITAL_COMMUNITY): Payer: Self-pay

## 2021-11-14 DIAGNOSIS — D45 Polycythemia vera: Secondary | ICD-10-CM

## 2021-11-14 MED ORDER — RUXOLITINIB PHOSPHATE 5 MG PO TABS
5.0000 mg | ORAL_TABLET | ORAL | 0 refills | Status: DC
  Filled 2021-11-14: qty 90, 30d supply, fill #0

## 2021-11-14 NOTE — Telephone Encounter (Signed)
CBC with Differential/Platelet Order: [id number] Status: Final result    Visible to patient: Yes (seen)    Next appt: 11/19/2021 at 02:30 PM in Radiation Oncology Noreene Filbert, MD)    Dx: Anemia in stage 3b chronic kidney dis...    0 Result Notes           Component Ref Range & Units 3 wk ago (10/22/21) 1 mo ago (10/08/21) 1 mo ago (10/08/21) 1 mo ago (09/25/21) 1 mo ago (09/24/21) 1 mo ago (09/17/21) 2 mo ago (08/20/21)  WBC 4.0 - 10.5 K/uL 11.1 High    12.4 High   12.5 High   12.4 High   13.0 High   13.7 High    RBC 3.87 - 5.11 MIL/uL 3.73 Low    3.36 Low   2.73 Low   3.62 Low   4.09  3.99   Hemoglobin 12.0 - 15.0 g/dL 10.5 Low    9.9 Low   7.5 Low   9.9 Low   11.6 Low   11.5 Low    HCT 36.0 - 46.0 % 34.3 Low    31.4 Low   25.1 Low   33.3 Low   37.7  37.4   MCV 80.0 - 100.0 fL 92.0   93.5  91.9  92.0  92.2  93.7   MCH 26.0 - 34.0 pg 28.2   29.5  27.5  27.3  28.4  28.8   MCHC 30.0 - 36.0 g/dL 30.6   31.5  29.9 Low   29.7 Low   30.8  30.7   RDW 11.5 - 15.5 % 20.3 High    20.6 High   22.1 High   22.6 High   22.4 High   21.9 High    Platelets 150 - 400 K/uL 744 High    816 High   628 High   796 High   911 High Panic  CM  922 High Panic  CM   nRBC 0.0 - 0.2 % 0.0   0.0  0.0 CM  0.0  0.0  0.0   Neutrophils Relative % % 63   65   59  60  64   Neutro Abs 1.7 - 7.7 K/uL 7.0   8.1 High    7.3  7.9 High   8.8 High    Lymphocytes Relative % '16   12   18  16  14   '$ Lymphs Abs 0.7 - 4.0 K/uL 1.8   1.5   2.2  2.1  2.0   Monocytes Relative % '7   11   9  9  9   '$ Monocytes Absolute 0.1 - 1.0 K/uL 0.8   1.3 High    1.2 High   1.1 High   1.2 High    Eosinophils Relative % '6   5   6  7  6   '$ Eosinophils Absolute 0.0 - 0.5 K/uL 0.6 High    0.6 High    0.7 High   0.8 High   0.8 High    Basophils Relative % '2   2   2  2  2   '$ Basophils Absolute 0.0 - 0.1 K/uL 0.2 High    0.2 High    0.2 High   0.3 High   0.3 High    WBC Morphology  MILD LEFT SHIFT (1-5% METAS, OCC MYELO, OCC BANDS)  MILD LEFT SHIFT (1-5% METAS,  OCC MYELO, OCC BANDS)    MORPHOLOGY UNREMARKABLE  MILD LEFT SHIFT (1-5% METAS, OCC  MYELO, OCC BANDS) CM  MORPHOLOGY UNREMARKABLE   Comment: DIFF CONFIRMED BY MANUAL  RBC Morphology  MIXED RBC POPULATION  MIXED RBC POPULATION    MIXED RBC POPULATION  MIXED RBC POPULATION.  MORPHOLOGY UNREMARKABLE   Smear Review  PLATELETS APPEAR INCREASED     Normal platelet morphology  PLATELETS APPEAR INCREASED CM  PLATELETS APPEAR INCREASED CM   Comment: PLATELETS VARY IN SIZE AND GRANULATION  Immature Granulocytes % '6   5   6  6  5   '$ Abs Immature Granulocytes 0.00 - 0.07 K/uL 0.63 High    0.59 High  CM   0.71 High  CM  0.74 High  CM  0.64 High    Giant PLTs  PRESENT       PRESENT CM   Comment: Performed at [facility], [address] [place], [place] [address]  Plt Morphology   PLATELETS APPEAR INCREASED CM        Resulting Agency  [facility] [place] [facility] [place] [facility] [place] [facility] [place] [facility] [place] [facility] [place] [facility]         Specimen Collected: 10/22/21 13:10 Last Resulted: 10/22/21 14:03      Lab Flowsheet    Order Details    View Encounter    Lab and Collection Details    Routing    Result History    View All Conversations on this Encounter      CM=Additional comments      Result Care Coordination   Patient Communication   Add Comments   Seen Back to Top       Other Results from 10/22/2021   Contains abnormal data Comprehensive metabolic panel Order: [id number] Status: Final result    Visible to patient: Yes (seen)    Next appt: 11/19/2021 at 02:30 PM in Radiation Oncology Noreene Filbert, MD)    Dx: Anemia in stage 3b chronic kidney dis...    0 Result Notes           Component Ref Range & Units 3 wk ago (10/22/21) 1 mo ago (10/08/21) 1 mo ago (09/25/21) 1 mo ago (09/24/21) 1 mo ago (09/17/21) 2 mo ago (08/20/21) 3 mo ago (07/25/21)  Sodium 135 - 145 mmol/L 135  132 Low   141 CM  136  136  135  133 Low    Potassium 3.5 - 5.1 mmol/L 4.2  4.0  4.4  4.6  4.9  4.4  4.2   Chloride  98 - 111 mmol/L 106  99  115 High   107  101  100  101   CO2 22 - 32 mmol/L '25  24  24  21 '$ Low   '26  27  26   '$ Glucose, Bld 70 - 99 mg/dL 190 High   138 High  CM  95 CM  134 High  CM  152 High  CM  166 High  CM  165 High  CM   Comment: Glucose reference range applies only to samples taken after fasting for at least 8 hours.  BUN 8 - 23 mg/dL 31 High   34 High   23  36 High   39 High   23  26 High    Creatinine, Ser 0.44 - 1.00 mg/dL 1.43 High   1.44 High   0.84  1.43 High   1.33 High   1.13 High   1.47 High    Calcium 8.9 - 10.3 mg/dL 8.5 Low  8.4 Low   7.9 Low   8.6 Low   8.9  8.6 Low   9.0   Total Protein 6.5 - 8.1 g/dL 7.0  6.6  4.9 Low   6.3 Low   7.0  7.0  7.2   Albumin 3.5 - 5.0 g/dL 3.6  3.6  2.9 Low   3.5  3.9  3.8  4.0   AST 15 - 41 U/L '18  18  14 '$ Low   '21  17  20  16   '$ ALT 0 - 44 U/L '11  13  10  11  11  12  11   '$ Alkaline Phosphatase 38 - 126 U/L 98  83  73  88  101  96  101   Total Bilirubin 0.3 - 1.2 mg/dL 0.4  0.5  0.3  0.2 Low   0.4  0.5  0.1 Low    GFR, Estimated >60 mL/min 34 Low   34 Low  CM  >60 CM  34 Low  CM  38 Low  CM  46 Low  CM  33 Low  CM   Comment: (NOTE)  Calculated using the CKD-EPI Creatinine Equation (2021)   Anion gap 5 - 15 4 Low   9 CM  2 Low  CM  8 CM  9 CM  8 CM  6 CM   Comment: Performed at [facility], [address] [place], [place] [address]  Resulting Agency  [facility] [place] [facility] [place] [facility] [place] [facility] [place] [facility] [place] [facility] [place] [facility]         Specimen Collected: 10/22/21 13:10 Last Resulted: 10/22/21 13:47

## 2021-11-15 ENCOUNTER — Other Ambulatory Visit (HOSPITAL_COMMUNITY): Payer: Self-pay

## 2021-11-16 ENCOUNTER — Other Ambulatory Visit (HOSPITAL_COMMUNITY): Payer: Self-pay

## 2021-11-16 DIAGNOSIS — N1832 Chronic kidney disease, stage 3b: Secondary | ICD-10-CM | POA: Diagnosis not present

## 2021-11-16 DIAGNOSIS — D631 Anemia in chronic kidney disease: Secondary | ICD-10-CM | POA: Diagnosis not present

## 2021-11-16 DIAGNOSIS — D45 Polycythemia vera: Secondary | ICD-10-CM | POA: Diagnosis not present

## 2021-11-16 DIAGNOSIS — S81811A Laceration without foreign body, right lower leg, initial encounter: Secondary | ICD-10-CM | POA: Diagnosis not present

## 2021-11-16 DIAGNOSIS — F39 Unspecified mood [affective] disorder: Secondary | ICD-10-CM | POA: Diagnosis not present

## 2021-11-16 DIAGNOSIS — C4490 Unspecified malignant neoplasm of skin, unspecified: Secondary | ICD-10-CM | POA: Diagnosis not present

## 2021-11-19 ENCOUNTER — Telehealth: Payer: Self-pay | Admitting: *Deleted

## 2021-11-19 ENCOUNTER — Ambulatory Visit
Admission: RE | Admit: 2021-11-19 | Discharge: 2021-11-19 | Disposition: A | Discharge status: Home / Self Care | Payer: Medicare Other | Source: Ambulatory Visit | Attending: Radiation Oncology | Admitting: Radiation Oncology

## 2021-11-19 ENCOUNTER — Encounter: Payer: Self-pay | Admitting: Radiation Oncology

## 2021-11-19 ENCOUNTER — Other Ambulatory Visit: Payer: Self-pay

## 2021-11-19 ENCOUNTER — Inpatient Hospital Stay: Payer: Medicare Other | Attending: Oncology

## 2021-11-19 VITALS — BP 128/72 | HR 78 | Temp 97.4°F

## 2021-11-19 DIAGNOSIS — F39 Unspecified mood [affective] disorder: Secondary | ICD-10-CM | POA: Diagnosis not present

## 2021-11-19 DIAGNOSIS — D45 Polycythemia vera: Secondary | ICD-10-CM | POA: Insufficient documentation

## 2021-11-19 DIAGNOSIS — D631 Anemia in chronic kidney disease: Secondary | ICD-10-CM | POA: Diagnosis not present

## 2021-11-19 DIAGNOSIS — Z79899 Other long term (current) drug therapy: Secondary | ICD-10-CM | POA: Insufficient documentation

## 2021-11-19 DIAGNOSIS — N1832 Chronic kidney disease, stage 3b: Secondary | ICD-10-CM

## 2021-11-19 DIAGNOSIS — C060 Malignant neoplasm of cheek mucosa: Secondary | ICD-10-CM | POA: Diagnosis not present

## 2021-11-19 DIAGNOSIS — D7581 Myelofibrosis: Secondary | ICD-10-CM | POA: Insufficient documentation

## 2021-11-19 DIAGNOSIS — Z923 Personal history of irradiation: Secondary | ICD-10-CM | POA: Insufficient documentation

## 2021-11-19 DIAGNOSIS — S81811A Laceration without foreign body, right lower leg, initial encounter: Secondary | ICD-10-CM | POA: Diagnosis not present

## 2021-11-19 DIAGNOSIS — R5383 Other fatigue: Secondary | ICD-10-CM

## 2021-11-19 DIAGNOSIS — C4490 Unspecified malignant neoplasm of skin, unspecified: Secondary | ICD-10-CM | POA: Diagnosis not present

## 2021-11-19 DIAGNOSIS — C44329 Squamous cell carcinoma of skin of other parts of face: Secondary | ICD-10-CM

## 2021-11-19 LAB — SAMPLE TO BLOOD BANK

## 2021-11-19 LAB — CBC WITH DIFFERENTIAL/PLATELET
Abs Immature Granulocytes: 0.6 10*3/uL — ABNORMAL HIGH (ref 0.00–0.07)
Basophils Absolute: 0.2 10*3/uL — ABNORMAL HIGH (ref 0.0–0.1)
Basophils Relative: 2 %
Eosinophils Absolute: 0.6 10*3/uL — ABNORMAL HIGH (ref 0.0–0.5)
Eosinophils Relative: 5 %
HCT: 34.4 % — ABNORMAL LOW (ref 36.0–46.0)
Hemoglobin: 10.7 g/dL — ABNORMAL LOW (ref 12.0–15.0)
Immature Granulocytes: 6 %
Lymphocytes Relative: 22 %
Lymphs Abs: 2.3 10*3/uL (ref 0.7–4.0)
MCH: 28.3 pg (ref 26.0–34.0)
MCHC: 31.1 g/dL (ref 30.0–36.0)
MCV: 91 fL (ref 80.0–100.0)
Monocytes Absolute: 1.1 10*3/uL — ABNORMAL HIGH (ref 0.1–1.0)
Monocytes Relative: 11 %
Neutro Abs: 5.5 10*3/uL (ref 1.7–7.7)
Neutrophils Relative %: 54 %
Platelets: 660 10*3/uL — ABNORMAL HIGH (ref 150–400)
RBC: 3.78 MIL/uL — ABNORMAL LOW (ref 3.87–5.11)
RDW: 22 % — ABNORMAL HIGH (ref 11.5–15.5)
Smear Review: NORMAL
WBC: 10.2 10*3/uL (ref 4.0–10.5)
nRBC: 0.2 % (ref 0.0–0.2)

## 2021-11-19 LAB — URINALYSIS, COMPLETE (UACMP) WITH MICROSCOPIC
Bilirubin Urine: NEGATIVE
Glucose, UA: NEGATIVE mg/dL
Hgb urine dipstick: NEGATIVE
Ketones, ur: NEGATIVE mg/dL
Leukocytes,Ua: NEGATIVE
Nitrite: NEGATIVE
Protein, ur: 30 mg/dL — AB
Specific Gravity, Urine: 1.023 (ref 1.005–1.030)
pH: 5 (ref 5.0–8.0)

## 2021-11-19 NOTE — Telephone Encounter (Signed)
Received on (11/19/21) via of fax Physician Orders from [facility] requesting signature and return.  Given to provider to sign.    Physician Orders signed and faxed back to [facility].  Confirmation received and copy scanned into the chart.//AB/CMA

## 2021-11-19 NOTE — Progress Notes (Signed)
Radiation Oncology Follow up Note  Name: Laura Wilcox   Date:   11/19/2021 MRN:  [id number] DOB: 08/02/1926    This 88 y.o. female presents to the clinic today for 1 month follow-up status post electron-beam therapy to her left cheek for a squamous cell carcinoma.  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  Seen today in follow-up she is doing well this area is well granulated in with a scab on it much more contracted in size no evidence of ulceration.  She is having no symptoms from it..  COMPLICATIONS OF TREATMENT: none  FOLLOW UP COMPLIANCE: keeps appointments   PHYSICAL EXAM:  BP 128/72 (BP Location: Right Arm, Patient Position: Sitting, Cuff Size: Small)   Pulse 78   Temp (!) 97.4 F (36.3 C) (Tympanic)  Areas as described above.  No evidence of submental or cervical adenopathy.  Wheelchair-bound.  Well-developed well-nourished patient in NAD. HEENT reveals PERLA, EOMI, discs not visualized.  Oral cavity is clear. No oral mucosal lesions are identified. Neck is clear without evidence of cervical or supraclavicular adenopathy. Lungs are clear to A&P. Cardiac examination is essentially unremarkable with regular rate and rhythm without murmur rub or thrill. Abdomen is benign with no organomegaly or masses noted. Motor sensory and DTR levels are equal and symmetric in the upper and lower extremities. Cranial nerves II through XII are grossly intact. Proprioception is intact. No peripheral adenopathy or edema is identified. No motor or sensory levels are noted. Crude visual fields are within normal range.  RADIOLOGY RESULTS: No films for review  PLAN: Present time patient is doing well lesion is completely resolved with evolving granulation tissue and scab formation present.  I have  assured them over the next several months that we will also clear.  I will see her back in 3 months for follow-up and then turn follow-up care over to dermatology.  I would like to take this opportunity to thank you for allowing me to participate in the care of your patient.Noreene Filbert, MD

## 2021-11-20 DIAGNOSIS — N1832 Chronic kidney disease, stage 3b: Secondary | ICD-10-CM | POA: Diagnosis not present

## 2021-11-20 DIAGNOSIS — D631 Anemia in chronic kidney disease: Secondary | ICD-10-CM | POA: Diagnosis not present

## 2021-11-20 DIAGNOSIS — S81811A Laceration without foreign body, right lower leg, initial encounter: Secondary | ICD-10-CM | POA: Diagnosis not present

## 2021-11-20 DIAGNOSIS — F39 Unspecified mood [affective] disorder: Secondary | ICD-10-CM | POA: Diagnosis not present

## 2021-11-20 DIAGNOSIS — C4490 Unspecified malignant neoplasm of skin, unspecified: Secondary | ICD-10-CM | POA: Diagnosis not present

## 2021-11-20 DIAGNOSIS — D45 Polycythemia vera: Secondary | ICD-10-CM | POA: Diagnosis not present

## 2021-11-20 LAB — URINE CULTURE: Culture: <10000 — AB

## 2021-11-21 ENCOUNTER — Encounter: Payer: Self-pay | Admitting: Physician Assistant

## 2021-11-21 ENCOUNTER — Telehealth: Payer: Self-pay | Admitting: *Deleted

## 2021-11-21 ENCOUNTER — Ambulatory Visit (INDEPENDENT_AMBULATORY_CARE_PROVIDER_SITE_OTHER): Payer: Medicare Other | Admitting: Physician Assistant

## 2021-11-21 DIAGNOSIS — S81811D Laceration without foreign body, right lower leg, subsequent encounter: Secondary | ICD-10-CM

## 2021-11-21 NOTE — Telephone Encounter (Signed)
Faxed Wound Care Instructions to [facility]-Attn:Maggie.    Please leave dressing in place, change outer dressing including gauze and coban once per week.    Confirmation received and copy scanned into the chart.//AB/CMA

## 2021-11-21 NOTE — Progress Notes (Signed)
   Referring Provider Glean Hess, MD [address] [place] [address] [place],  [place] [address]   CC:  Chief Complaint  Patient presents with   Follow-up      Laura Wilcox is an 88 y.o. female.  HPI:   This is a 88 year old female seen in our office for follow-up evaluation status post skin tear to her right lower leg.  She was most recently seen in our office on 10/30/2021.  Her wound was originally repaired in the emergency room with stitches.  She was seen in our office where it was recommended to do dressing changes with endoform.  She has tolerated this well and has formed good granulation tissue in the wound bed.  In follow-up today her daughter has come with her again to help provide more information.  She is in a nursing facility and is unsure how often they are actually changing wound as this is not documented but does note that she is receiving a bill for twice weekly wound changes.  The patient denies any significant complaints today, she denies any infectious symptoms.  Review of Systems General: Negative for fever  Physical Exam    11/19/2021    2:29 PM 10/22/2021    1:23 PM 10/03/2021   10:48 AM  Vitals with BMI  Weight  480 lbs   Systolic 165 537 482  Diastolic 72 76 63  Pulse 78 94 76    General:  No acute distress,  Alert and oriented, Non-Toxic, Normal speech and affect Right shin with dressing in place, this was removed which showed underlying granular tissues with clean wound edges no surrounding redness or discharge.  The wound measures approximately 6 x 3 cm    Assessment/Plan   This is a 88 year old female seen in our office for follow-up of chronic wound to her right lower extremity.  Overall she is doing well, she has good granular tissue formation.  She has no signs of infection.  We did remove the rile on single-sided nonadherent wound dressing today.  I then placed the supra SDRM  ( Lot number [id number]) use by date 2023-08 followed by Rylon 1  single sided nonadherent wound dressing  (Lot [id number]) Use by 2027-11. These were donated by [employer].  We would like to leave this in place for 2 weeks, I would like the nursing staff to change the outer dressing once per week.  We have faxed over this information to her nursing facility.  The patient will follow-up in 2 weeks or sooner as needed.  Both the patient and her daughter verbalized understanding and agreement to today's plan.  Laura Wilcox Laura Wilcox 11/21/2021, 12:04 PM

## 2021-11-22 DIAGNOSIS — E782 Mixed hyperlipidemia: Secondary | ICD-10-CM | POA: Diagnosis not present

## 2021-11-22 DIAGNOSIS — L603 Nail dystrophy: Secondary | ICD-10-CM | POA: Diagnosis not present

## 2021-11-22 DIAGNOSIS — C4490 Unspecified malignant neoplasm of skin, unspecified: Secondary | ICD-10-CM | POA: Diagnosis not present

## 2021-11-22 DIAGNOSIS — F39 Unspecified mood [affective] disorder: Secondary | ICD-10-CM | POA: Diagnosis not present

## 2021-11-22 DIAGNOSIS — G301 Alzheimer's disease with late onset: Secondary | ICD-10-CM | POA: Diagnosis not present

## 2021-11-22 DIAGNOSIS — D631 Anemia in chronic kidney disease: Secondary | ICD-10-CM | POA: Diagnosis not present

## 2021-11-22 DIAGNOSIS — M79674 Pain in right toe(s): Secondary | ICD-10-CM | POA: Diagnosis not present

## 2021-11-22 DIAGNOSIS — R269 Unspecified abnormalities of gait and mobility: Secondary | ICD-10-CM | POA: Diagnosis not present

## 2021-11-22 DIAGNOSIS — M79675 Pain in left toe(s): Secondary | ICD-10-CM | POA: Diagnosis not present

## 2021-11-22 DIAGNOSIS — F0390 Unspecified dementia without behavioral disturbance: Secondary | ICD-10-CM | POA: Diagnosis not present

## 2021-11-22 DIAGNOSIS — S81811A Laceration without foreign body, right lower leg, initial encounter: Secondary | ICD-10-CM | POA: Diagnosis not present

## 2021-11-22 DIAGNOSIS — I739 Peripheral vascular disease, unspecified: Secondary | ICD-10-CM | POA: Diagnosis not present

## 2021-11-22 DIAGNOSIS — B351 Tinea unguium: Secondary | ICD-10-CM | POA: Diagnosis not present

## 2021-11-22 DIAGNOSIS — F02B0 Dementia in other diseases classified elsewhere, moderate, without behavioral disturbance, psychotic disturbance, mood disturbance, and anxiety: Secondary | ICD-10-CM | POA: Diagnosis not present

## 2021-11-22 DIAGNOSIS — D45 Polycythemia vera: Secondary | ICD-10-CM | POA: Diagnosis not present

## 2021-11-22 DIAGNOSIS — F331 Major depressive disorder, recurrent, moderate: Secondary | ICD-10-CM | POA: Diagnosis not present

## 2021-11-22 DIAGNOSIS — L851 Acquired keratosis [keratoderma] palmaris et plantaris: Secondary | ICD-10-CM | POA: Diagnosis not present

## 2021-11-22 DIAGNOSIS — N1832 Chronic kidney disease, stage 3b: Secondary | ICD-10-CM | POA: Diagnosis not present

## 2021-11-26 DIAGNOSIS — D45 Polycythemia vera: Secondary | ICD-10-CM | POA: Diagnosis not present

## 2021-11-26 DIAGNOSIS — D631 Anemia in chronic kidney disease: Secondary | ICD-10-CM | POA: Diagnosis not present

## 2021-11-26 DIAGNOSIS — N1832 Chronic kidney disease, stage 3b: Secondary | ICD-10-CM | POA: Diagnosis not present

## 2021-11-26 DIAGNOSIS — S81811A Laceration without foreign body, right lower leg, initial encounter: Secondary | ICD-10-CM | POA: Diagnosis not present

## 2021-11-26 DIAGNOSIS — C4490 Unspecified malignant neoplasm of skin, unspecified: Secondary | ICD-10-CM | POA: Diagnosis not present

## 2021-11-26 DIAGNOSIS — F39 Unspecified mood [affective] disorder: Secondary | ICD-10-CM | POA: Diagnosis not present

## 2021-11-27 DIAGNOSIS — D631 Anemia in chronic kidney disease: Secondary | ICD-10-CM | POA: Diagnosis not present

## 2021-11-27 DIAGNOSIS — D45 Polycythemia vera: Secondary | ICD-10-CM | POA: Diagnosis not present

## 2021-11-27 DIAGNOSIS — F39 Unspecified mood [affective] disorder: Secondary | ICD-10-CM | POA: Diagnosis not present

## 2021-11-27 DIAGNOSIS — S81811A Laceration without foreign body, right lower leg, initial encounter: Secondary | ICD-10-CM | POA: Diagnosis not present

## 2021-11-27 DIAGNOSIS — N1832 Chronic kidney disease, stage 3b: Secondary | ICD-10-CM | POA: Diagnosis not present

## 2021-11-27 DIAGNOSIS — C4490 Unspecified malignant neoplasm of skin, unspecified: Secondary | ICD-10-CM | POA: Diagnosis not present

## 2021-11-28 DIAGNOSIS — C4490 Unspecified malignant neoplasm of skin, unspecified: Secondary | ICD-10-CM | POA: Diagnosis not present

## 2021-11-28 DIAGNOSIS — F39 Unspecified mood [affective] disorder: Secondary | ICD-10-CM | POA: Diagnosis not present

## 2021-11-28 DIAGNOSIS — D45 Polycythemia vera: Secondary | ICD-10-CM | POA: Diagnosis not present

## 2021-11-28 DIAGNOSIS — D631 Anemia in chronic kidney disease: Secondary | ICD-10-CM | POA: Diagnosis not present

## 2021-11-28 DIAGNOSIS — S81811A Laceration without foreign body, right lower leg, initial encounter: Secondary | ICD-10-CM | POA: Diagnosis not present

## 2021-11-28 DIAGNOSIS — N1832 Chronic kidney disease, stage 3b: Secondary | ICD-10-CM | POA: Diagnosis not present

## 2021-11-29 DIAGNOSIS — D631 Anemia in chronic kidney disease: Secondary | ICD-10-CM | POA: Diagnosis not present

## 2021-11-29 DIAGNOSIS — C4490 Unspecified malignant neoplasm of skin, unspecified: Secondary | ICD-10-CM | POA: Diagnosis not present

## 2021-11-29 DIAGNOSIS — S81811A Laceration without foreign body, right lower leg, initial encounter: Secondary | ICD-10-CM | POA: Diagnosis not present

## 2021-11-29 DIAGNOSIS — D45 Polycythemia vera: Secondary | ICD-10-CM | POA: Diagnosis not present

## 2021-11-29 DIAGNOSIS — N1832 Chronic kidney disease, stage 3b: Secondary | ICD-10-CM | POA: Diagnosis not present

## 2021-11-29 DIAGNOSIS — F39 Unspecified mood [affective] disorder: Secondary | ICD-10-CM | POA: Diagnosis not present

## 2021-12-03 DIAGNOSIS — D631 Anemia in chronic kidney disease: Secondary | ICD-10-CM | POA: Diagnosis not present

## 2021-12-03 DIAGNOSIS — S81811A Laceration without foreign body, right lower leg, initial encounter: Secondary | ICD-10-CM | POA: Diagnosis not present

## 2021-12-03 DIAGNOSIS — F39 Unspecified mood [affective] disorder: Secondary | ICD-10-CM | POA: Diagnosis not present

## 2021-12-03 DIAGNOSIS — C4490 Unspecified malignant neoplasm of skin, unspecified: Secondary | ICD-10-CM | POA: Diagnosis not present

## 2021-12-03 DIAGNOSIS — D45 Polycythemia vera: Secondary | ICD-10-CM | POA: Diagnosis not present

## 2021-12-03 DIAGNOSIS — N1832 Chronic kidney disease, stage 3b: Secondary | ICD-10-CM | POA: Diagnosis not present

## 2021-12-05 DIAGNOSIS — N1832 Chronic kidney disease, stage 3b: Secondary | ICD-10-CM | POA: Diagnosis not present

## 2021-12-05 DIAGNOSIS — F39 Unspecified mood [affective] disorder: Secondary | ICD-10-CM | POA: Diagnosis not present

## 2021-12-05 DIAGNOSIS — D631 Anemia in chronic kidney disease: Secondary | ICD-10-CM | POA: Diagnosis not present

## 2021-12-05 DIAGNOSIS — D45 Polycythemia vera: Secondary | ICD-10-CM | POA: Diagnosis not present

## 2021-12-05 DIAGNOSIS — S81811A Laceration without foreign body, right lower leg, initial encounter: Secondary | ICD-10-CM | POA: Diagnosis not present

## 2021-12-05 DIAGNOSIS — C4490 Unspecified malignant neoplasm of skin, unspecified: Secondary | ICD-10-CM | POA: Diagnosis not present

## 2021-12-06 DIAGNOSIS — N1832 Chronic kidney disease, stage 3b: Secondary | ICD-10-CM | POA: Diagnosis not present

## 2021-12-06 DIAGNOSIS — D631 Anemia in chronic kidney disease: Secondary | ICD-10-CM | POA: Diagnosis not present

## 2021-12-06 DIAGNOSIS — F39 Unspecified mood [affective] disorder: Secondary | ICD-10-CM | POA: Diagnosis not present

## 2021-12-06 DIAGNOSIS — S81811A Laceration without foreign body, right lower leg, initial encounter: Secondary | ICD-10-CM | POA: Diagnosis not present

## 2021-12-06 DIAGNOSIS — D45 Polycythemia vera: Secondary | ICD-10-CM | POA: Diagnosis not present

## 2021-12-06 DIAGNOSIS — C4490 Unspecified malignant neoplasm of skin, unspecified: Secondary | ICD-10-CM | POA: Diagnosis not present

## 2021-12-10 ENCOUNTER — Other Ambulatory Visit: Payer: Self-pay | Admitting: Oncology

## 2021-12-10 ENCOUNTER — Other Ambulatory Visit (HOSPITAL_COMMUNITY): Payer: Self-pay

## 2021-12-10 DIAGNOSIS — Z9181 History of falling: Secondary | ICD-10-CM | POA: Diagnosis not present

## 2021-12-10 DIAGNOSIS — S81811D Laceration without foreign body, right lower leg, subsequent encounter: Secondary | ICD-10-CM | POA: Diagnosis not present

## 2021-12-10 DIAGNOSIS — Z87440 Personal history of urinary (tract) infections: Secondary | ICD-10-CM | POA: Diagnosis not present

## 2021-12-10 DIAGNOSIS — D45 Polycythemia vera: Secondary | ICD-10-CM

## 2021-12-10 DIAGNOSIS — F39 Unspecified mood [affective] disorder: Secondary | ICD-10-CM | POA: Diagnosis not present

## 2021-12-10 DIAGNOSIS — N2581 Secondary hyperparathyroidism of renal origin: Secondary | ICD-10-CM | POA: Diagnosis not present

## 2021-12-10 DIAGNOSIS — C4490 Unspecified malignant neoplasm of skin, unspecified: Secondary | ICD-10-CM | POA: Diagnosis not present

## 2021-12-10 DIAGNOSIS — N1832 Chronic kidney disease, stage 3b: Secondary | ICD-10-CM | POA: Diagnosis not present

## 2021-12-10 DIAGNOSIS — E440 Moderate protein-calorie malnutrition: Secondary | ICD-10-CM | POA: Diagnosis not present

## 2021-12-10 DIAGNOSIS — D631 Anemia in chronic kidney disease: Secondary | ICD-10-CM | POA: Diagnosis not present

## 2021-12-10 DIAGNOSIS — F321 Major depressive disorder, single episode, moderate: Secondary | ICD-10-CM | POA: Diagnosis not present

## 2021-12-10 DIAGNOSIS — R32 Unspecified urinary incontinence: Secondary | ICD-10-CM | POA: Diagnosis not present

## 2021-12-11 ENCOUNTER — Other Ambulatory Visit (HOSPITAL_COMMUNITY): Payer: Self-pay

## 2021-12-11 NOTE — Telephone Encounter (Signed)
CBC with Differential/Platelet Order: [id number] Status: Final result    Visible to patient: Yes (seen)    Next appt: 12/12/2021 at 02:45 PM in Plastic Surgery Stevie Kern Hedges, PA-C)    Dx: Anemia in stage 3b chronic kidney dis...    0 Result Notes           Component Ref Range & Units 3 wk ago (11/19/21) 1 mo ago (10/22/21) 1 mo ago (10/22/21) 2 mo ago (10/08/21) 2 mo ago (10/08/21) 2 mo ago (09/25/21) 2 mo ago (09/24/21)  WBC 4.0 - 10.5 K/uL 10.2   11.1 High    12.4 High   12.5 High   12.4 High    RBC 3.87 - 5.11 MIL/uL 3.78 Low    3.73 Low    3.36 Low   2.73 Low   3.62 Low    Hemoglobin 12.0 - 15.0 g/dL 10.7 Low    10.5 Low    9.9 Low   7.5 Low   9.9 Low    HCT 36.0 - 46.0 % 34.4 Low    34.3 Low    31.4 Low   25.1 Low   33.3 Low    MCV 80.0 - 100.0 fL 91.0   92.0   93.5  91.9  92.0   MCH 26.0 - 34.0 pg 28.3   28.2   29.5  27.5  27.3   MCHC 30.0 - 36.0 g/dL 31.1   30.6   31.5  29.9 Low   29.7 Low    RDW 11.5 - 15.5 % 22.0 High    20.3 High    20.6 High   22.1 High   22.6 High    Platelets 150 - 400 K/uL 660 High    744 High    816 High   628 High   796 High    nRBC 0.0 - 0.2 % 0.2   0.0   0.0  0.0 CM  0.0   Neutrophils Relative % % 54   63   65   59   Neutro Abs 1.7 - 7.7 K/uL 5.5   7.0   8.1 High    7.3   Lymphocytes Relative % '22   16   12   18   '$ Lymphs Abs 0.7 - 4.0 K/uL 2.3   1.8   1.5   2.2   Monocytes Relative % '11   7   11   9   '$ Monocytes Absolute 0.1 - 1.0 K/uL 1.1 High    0.8   1.3 High    1.2 High    Eosinophils Relative % '5   6   5   6   '$ Eosinophils Absolute 0.0 - 0.5 K/uL 0.6 High    0.6 High    0.6 High    0.7 High    Basophils Relative % '2   2   2   2   '$ Basophils Absolute 0.0 - 0.1 K/uL 0.2 High    0.2 High    0.2 High    0.2 High    WBC Morphology  OCCASIONAL MYELO'S AND META'S PRESENT  MILD LEFT SHIFT (1-5% METAS, OCC MYELO, OCC BANDS)  MILD LEFT SHIFT (1-5% METAS, OCC MYELO, OCC BANDS) CM  MILD LEFT SHIFT (1-5% METAS, OCC MYELO, OCC BANDS)    MORPHOLOGY  UNREMARKABLE   Smear Review  Normal platelet morphology   PLATELETS APPEAR INCREASED CM     Normal platelet morphology   Comment: PLATELETS APPEAR INCREASED  Immature  Granulocytes % '6   6   5   6   '$ Abs Immature Granulocytes 0.00 - 0.07 K/uL 0.60 High    0.63 High    0.59 High  CM   0.71 High  CM   Comment: Performed at [facility], [address] [place], [place] [address]  RBC MORPHOLOGY   MIXED RBC POPULATION  MIXED RBC POPULATION  MIXED RBC POPULATION    MIXED RBC POPULATION   Plt Morphology   PLATELETS VARY IN SIZE AND GRANULATION CM   PLATELETS APPEAR INCREASED CM      Giant PLTs    PRESENT CM       Resulting Agency  [facility] [place] [facility] [place] [facility] [place] [facility] [place] [facility] [place] [facility] [place] [facility]         Specimen Collected: 11/19/21 15:09 Last Resulted: 11/19/21 15:55        Comprehensive metabolic panel Order: [id number] Status: Final result    Visible to patient: Yes (seen)    Next appt: 12/12/2021 at 02:45 PM in Plastic Surgery Stevie Kern Hedges, PA-C)    Dx: Anemia in stage 3b chronic kidney dis...    0 Result Notes           Component Ref Range & Units 1 mo ago (10/22/21) 2 mo ago (10/08/21) 2 mo ago (09/25/21) 2 mo ago (09/24/21) 2 mo ago (09/17/21) 3 mo ago (08/20/21) 4 mo ago (07/25/21)  Sodium 135 - 145 mmol/L 135  132 Low   141 CM  136  136  135  133 Low    Potassium 3.5 - 5.1 mmol/L 4.2  4.0  4.4  4.6  4.9  4.4  4.2   Chloride 98 - 111 mmol/L 106  99  115 High   107  101  100  101   CO2 22 - 32 mmol/L '25  24  24  21 '$ Low   '26  27  26   '$ Glucose, Bld 70 - 99 mg/dL 190 High   138 High  CM  95 CM  134 High  CM  152 High  CM  166 High  CM  165 High  CM   Comment: Glucose reference range applies only to samples taken after fasting for at least 8 hours.  BUN 8 - 23 mg/dL 31 High   34 High   23  36 High   39 High   23  26 High    Creatinine, Ser 0.44 - 1.00 mg/dL 1.43 High   1.44 High   0.84  1.43 High   1.33 High   1.13 High   1.47 High    Calcium 8.9 - 10.3  mg/dL 8.5 Low   8.4 Low   7.9 Low   8.6 Low   8.9  8.6 Low   9.0   Total Protein 6.5 - 8.1 g/dL 7.0  6.6  4.9 Low   6.3 Low   7.0  7.0  7.2   Albumin 3.5 - 5.0 g/dL 3.6  3.6  2.9 Low   3.5  3.9  3.8  4.0   AST 15 - 41 U/L '18  18  14 '$ Low   '21  17  20  16   '$ ALT 0 - 44 U/L '11  13  10  11  11  12  11   '$ Alkaline Phosphatase 38 - 126 U/L 98  83  73  88  101  96  101   Total Bilirubin 0.3 - 1.2 mg/dL 0.4  0.5  0.3  0.2 Low   0.4  0.5  0.1 Low    GFR, Estimated >60 mL/min 34 Low   34 Low  CM  >60 CM  34 Low  CM  38 Low  CM  46 Low  CM  33 Low  CM   Comment: (NOTE)  Calculated using the CKD-EPI Creatinine Equation (2021)   Anion gap 5 - 15 4 Low   9 CM  2 Low  CM  8 CM  9 CM  8 CM  6 CM   Comment: Performed at [facility], [address] [place], [place] [address]  Resulting Agency  [facility] [place] [facility] [place] [facility] [place] [facility] [place] [facility] [place] [facility]         Specimen Collected: 10/22/21 13:10 Last Resulted: 10/22/21 13:47

## 2021-12-12 ENCOUNTER — Encounter: Payer: Self-pay | Admitting: Physician Assistant

## 2021-12-12 ENCOUNTER — Ambulatory Visit (INDEPENDENT_AMBULATORY_CARE_PROVIDER_SITE_OTHER): Payer: Medicare Other | Admitting: Physician Assistant

## 2021-12-12 DIAGNOSIS — N1832 Chronic kidney disease, stage 3b: Secondary | ICD-10-CM | POA: Diagnosis not present

## 2021-12-12 DIAGNOSIS — C4490 Unspecified malignant neoplasm of skin, unspecified: Secondary | ICD-10-CM | POA: Diagnosis not present

## 2021-12-12 DIAGNOSIS — D45 Polycythemia vera: Secondary | ICD-10-CM | POA: Diagnosis not present

## 2021-12-12 DIAGNOSIS — F39 Unspecified mood [affective] disorder: Secondary | ICD-10-CM | POA: Diagnosis not present

## 2021-12-12 DIAGNOSIS — S81811D Laceration without foreign body, right lower leg, subsequent encounter: Secondary | ICD-10-CM | POA: Diagnosis not present

## 2021-12-12 DIAGNOSIS — D631 Anemia in chronic kidney disease: Secondary | ICD-10-CM | POA: Diagnosis not present

## 2021-12-12 NOTE — Progress Notes (Signed)
   Referring Provider Glean Hess, MD [address] [place],  [place] [address]   CC:  Chief Complaint  Patient presents with   Follow-up      Laura Wilcox is an 88 y.o. female.  HPI: This is a 88 year old female seen in our office for follow-up evaluation status post skin tear to her right lower leg.  She was most recently seen in the office on 11/21/2021.  Her wound was originally repaired in the emergency room with sutures.  She was started on endoform and switched over to [name].  Since her last office visit her daughter who is at the office today reports that she is doing well, she notes that the dressing has remained over the wound since her last office visit and has no complaints.  The patient has no complaints here today.  Review of Systems General: Negative for fever  Physical Exam    11/19/2021    2:29 PM 10/22/2021    1:23 PM 10/03/2021   10:48 AM  Vitals with BMI  Weight  948 lbs   Systolic 546 270 350  Diastolic 72 76 63  Pulse 78 94 76     General:  No acute distress,  Alert and oriented, Non-Toxic, Normal speech and affect Right shin with dressing in place, this was removed which showed underlying granular tissues with clean wound edges no surrounding redness or discharge.  The wound measures approximately 2.5 x 1 cm. Another wound inferior and later to the original measuring 1x1cm    Assessment/Plan This is a 88 year old female seen in our office for follow-up of chronic wound to her right lower extremity.  Overall she is doing well, she has good granular tissue formation.  She has no signs of infection.  The wound has significantly improved since her last office visit.  I did remove the rylon single-sided nonadherent wound dressing today.  I then placed the supra SDRM  ( Lot number [id number]) This was donated by [employer].  We would like to leave this in place for 2 weeks, I would like the nursing staff to change the outer dressing  once per week.   The patient will follow-up in 2 weeks or sooner as needed.  Both the patient and her daughter verbalized understanding and agreement to today's plan.  Laura Wilcox 12/12/2021, 5:14 PM

## 2021-12-13 DIAGNOSIS — S81811D Laceration without foreign body, right lower leg, subsequent encounter: Secondary | ICD-10-CM | POA: Diagnosis not present

## 2021-12-13 DIAGNOSIS — N1832 Chronic kidney disease, stage 3b: Secondary | ICD-10-CM | POA: Diagnosis not present

## 2021-12-13 DIAGNOSIS — D631 Anemia in chronic kidney disease: Secondary | ICD-10-CM | POA: Diagnosis not present

## 2021-12-13 DIAGNOSIS — C4490 Unspecified malignant neoplasm of skin, unspecified: Secondary | ICD-10-CM | POA: Diagnosis not present

## 2021-12-13 DIAGNOSIS — D45 Polycythemia vera: Secondary | ICD-10-CM | POA: Diagnosis not present

## 2021-12-13 DIAGNOSIS — F39 Unspecified mood [affective] disorder: Secondary | ICD-10-CM | POA: Diagnosis not present

## 2021-12-17 ENCOUNTER — Encounter: Payer: Self-pay | Admitting: Oncology

## 2021-12-17 ENCOUNTER — Other Ambulatory Visit (HOSPITAL_COMMUNITY): Payer: Self-pay

## 2021-12-17 ENCOUNTER — Other Ambulatory Visit: Payer: Self-pay | Admitting: Oncology

## 2021-12-17 ENCOUNTER — Inpatient Hospital Stay (HOSPITAL_BASED_OUTPATIENT_CLINIC_OR_DEPARTMENT_OTHER): Payer: Medicare Other | Admitting: Oncology

## 2021-12-17 ENCOUNTER — Telehealth: Payer: Self-pay

## 2021-12-17 ENCOUNTER — Inpatient Hospital Stay: Payer: Medicare Other | Attending: Oncology

## 2021-12-17 DIAGNOSIS — Z8249 Family history of ischemic heart disease and other diseases of the circulatory system: Secondary | ICD-10-CM | POA: Insufficient documentation

## 2021-12-17 DIAGNOSIS — Z803 Family history of malignant neoplasm of breast: Secondary | ICD-10-CM | POA: Insufficient documentation

## 2021-12-17 DIAGNOSIS — D45 Polycythemia vera: Secondary | ICD-10-CM | POA: Diagnosis not present

## 2021-12-17 DIAGNOSIS — N1832 Chronic kidney disease, stage 3b: Secondary | ICD-10-CM

## 2021-12-17 DIAGNOSIS — Z881 Allergy status to other antibiotic agents status: Secondary | ICD-10-CM | POA: Insufficient documentation

## 2021-12-17 DIAGNOSIS — D61818 Other pancytopenia: Secondary | ICD-10-CM

## 2021-12-17 DIAGNOSIS — D631 Anemia in chronic kidney disease: Secondary | ICD-10-CM | POA: Diagnosis not present

## 2021-12-17 DIAGNOSIS — Z79899 Other long term (current) drug therapy: Secondary | ICD-10-CM | POA: Insufficient documentation

## 2021-12-17 DIAGNOSIS — C44329 Squamous cell carcinoma of skin of other parts of face: Secondary | ICD-10-CM | POA: Diagnosis not present

## 2021-12-17 DIAGNOSIS — D7581 Myelofibrosis: Secondary | ICD-10-CM | POA: Insufficient documentation

## 2021-12-17 DIAGNOSIS — Z7982 Long term (current) use of aspirin: Secondary | ICD-10-CM | POA: Diagnosis not present

## 2021-12-17 DIAGNOSIS — D709 Neutropenia, unspecified: Secondary | ICD-10-CM | POA: Diagnosis not present

## 2021-12-17 DIAGNOSIS — D471 Chronic myeloproliferative disease: Secondary | ICD-10-CM | POA: Insufficient documentation

## 2021-12-17 DIAGNOSIS — Z818 Family history of other mental and behavioral disorders: Secondary | ICD-10-CM | POA: Diagnosis not present

## 2021-12-17 DIAGNOSIS — Z923 Personal history of irradiation: Secondary | ICD-10-CM | POA: Insufficient documentation

## 2021-12-17 DIAGNOSIS — Z87440 Personal history of urinary (tract) infections: Secondary | ICD-10-CM | POA: Diagnosis not present

## 2021-12-17 LAB — COMPREHENSIVE METABOLIC PANEL
ALT: 12 U/L (ref 0–44)
AST: 17 U/L (ref 15–41)
Albumin: 4.1 g/dL (ref 3.5–5.0)
Alkaline Phosphatase: 95 U/L (ref 38–126)
Anion gap: 7 (ref 5–15)
BUN: 30 mg/dL — ABNORMAL HIGH (ref 8–23)
CO2: 27 mmol/L (ref 22–32)
Calcium: 8.9 mg/dL (ref 8.9–10.3)
Chloride: 104 mmol/L (ref 98–111)
Creatinine, Ser: 1.36 mg/dL — ABNORMAL HIGH (ref 0.44–1.00)
GFR, Estimated: 36 mL/min — ABNORMAL LOW (ref >60)
Glucose, Bld: 126 mg/dL — ABNORMAL HIGH (ref 70–99)
Potassium: 3.8 mmol/L (ref 3.5–5.1)
Sodium: 138 mmol/L (ref 135–145)
Total Bilirubin: 0.3 mg/dL (ref 0.3–1.2)
Total Protein: 7.1 g/dL (ref 6.5–8.1)

## 2021-12-17 LAB — CBC WITH DIFFERENTIAL/PLATELET
Abs Immature Granulocytes: 0.58 10*3/uL — ABNORMAL HIGH (ref 0.00–0.07)
Basophils Absolute: 0.2 10*3/uL — ABNORMAL HIGH (ref 0.0–0.1)
Basophils Relative: 2 %
Eosinophils Absolute: 0.5 10*3/uL (ref 0.0–0.5)
Eosinophils Relative: 4 %
HCT: 33.9 % — ABNORMAL LOW (ref 36.0–46.0)
Hemoglobin: 10.8 g/dL — ABNORMAL LOW (ref 12.0–15.0)
Immature Granulocytes: 5 %
Lymphocytes Relative: 13 %
Lymphs Abs: 1.5 10*3/uL (ref 0.7–4.0)
MCH: 28.9 pg (ref 26.0–34.0)
MCHC: 31.9 g/dL (ref 30.0–36.0)
MCV: 90.6 fL (ref 80.0–100.0)
Monocytes Absolute: 1.1 10*3/uL — ABNORMAL HIGH (ref 0.1–1.0)
Monocytes Relative: 10 %
Neutro Abs: 7.6 10*3/uL (ref 1.7–7.7)
Neutrophils Relative %: 66 %
Platelets: 644 10*3/uL — ABNORMAL HIGH (ref 150–400)
RBC: 3.74 MIL/uL — ABNORMAL LOW (ref 3.87–5.11)
RDW: 22.6 % — ABNORMAL HIGH (ref 11.5–15.5)
WBC: 11.4 10*3/uL — ABNORMAL HIGH (ref 4.0–10.5)
nRBC: 0 % (ref 0.0–0.2)

## 2021-12-17 MED ORDER — RUXOLITINIB PHOSPHATE 5 MG PO TABS
ORAL_TABLET | ORAL | 1 refills | Status: DC
  Filled 2021-12-17: qty 90, 30d supply, fill #0
  Filled 2022-01-15: qty 90, 30d supply, fill #1

## 2021-12-17 NOTE — Assessment & Plan Note (Signed)
Anemia due to CKD, and myelofibrosis.   Hemoglobin has improved.  No need for Retacrit. 

## 2021-12-17 NOTE — Assessment & Plan Note (Signed)
S/p palliative RT.

## 2021-12-17 NOTE — Telephone Encounter (Signed)
CBC with Differential/Platelet Order: [id number] Status: Final result    Visible to patient: Yes (seen)    Next appt: Today at 02:15 PM in Oncology ([facility])    Dx: Anemia in stage 3b chronic kidney dis...    0 Result Notes           Component Ref Range & Units 4 wk ago (11/19/21) 1 mo ago (10/22/21) 1 mo ago (10/22/21) 2 mo ago (10/08/21) 2 mo ago (10/08/21) 2 mo ago (09/25/21) 2 mo ago (09/24/21)  WBC 4.0 - 10.5 K/uL 10.2   11.1 High    12.4 High   12.5 High   12.4 High    RBC 3.87 - 5.11 MIL/uL 3.78 Low    3.73 Low    3.36 Low   2.73 Low   3.62 Low    Hemoglobin 12.0 - 15.0 g/dL 10.7 Low    10.5 Low    9.9 Low   7.5 Low   9.9 Low    HCT 36.0 - 46.0 % 34.4 Low    34.3 Low    31.4 Low   25.1 Low   33.3 Low    MCV 80.0 - 100.0 fL 91.0   92.0   93.5  91.9  92.0   MCH 26.0 - 34.0 pg 28.3   28.2   29.5  27.5  27.3   MCHC 30.0 - 36.0 g/dL 31.1   30.6   31.5  29.9 Low   29.7 Low    RDW 11.5 - 15.5 % 22.0 High    20.3 High    20.6 High   22.1 High   22.6 High    Platelets 150 - 400 K/uL 660 High    744 High    816 High   628 High   796 High    nRBC 0.0 - 0.2 % 0.2   0.0   0.0  0.0 CM  0.0   Neutrophils Relative % % 54   63   65   59   Neutro Abs 1.7 - 7.7 K/uL 5.5   7.0   8.1 High    7.3   Lymphocytes Relative % '22   16   12   18   '$ Lymphs Abs 0.7 - 4.0 K/uL 2.3   1.8   1.5   2.2   Monocytes Relative % '11   7   11   9   '$ Monocytes Absolute 0.1 - 1.0 K/uL 1.1 High    0.8   1.3 High    1.2 High    Eosinophils Relative % '5   6   5   6   '$ Eosinophils Absolute 0.0 - 0.5 K/uL 0.6 High    0.6 High    0.6 High    0.7 High    Basophils Relative % '2   2   2   2   '$ Basophils Absolute 0.0 - 0.1 K/uL 0.2 High    0.2 High    0.2 High    0.2 High    WBC Morphology  OCCASIONAL MYELO'S AND META'S PRESENT  MILD LEFT SHIFT (1-5% METAS, OCC MYELO, OCC BANDS)  MILD LEFT SHIFT (1-5% METAS, OCC MYELO, OCC BANDS) CM  MILD LEFT SHIFT (1-5% METAS, OCC MYELO, OCC BANDS)    MORPHOLOGY UNREMARKABLE   Smear Review  Normal  platelet morphology   PLATELETS APPEAR INCREASED CM     Normal platelet morphology   Comment: PLATELETS APPEAR INCREASED  Immature Granulocytes % 6  $'6   5   6   'i$ Abs Immature Granulocytes 0.00 - 0.07 K/uL 0.60 High    0.63 High    0.59 High  CM   0.71 High  CM   Comment: Performed at [facility], [address] [place], [place] [address]  RBC MORPHOLOGY   MIXED RBC POPULATION  MIXED RBC POPULATION  MIXED RBC POPULATION    MIXED RBC POPULATION   Plt Morphology   PLATELETS VARY IN SIZE AND GRANULATION CM   PLATELETS APPEAR INCREASED CM      Giant PLTs    PRESENT CM       Resulting Agency  [facility] [place] [facility]         Specimen Collected: 11/19/21 15:09 Last Resulted: 11/19/21 15:55      Comprehensive metabolic panel Order: [id number] Status: Final result    Visible to patient: Yes (seen)    Next appt: Today at 02:15 PM in Oncology ([facility])    Dx: Anemia in stage 3b chronic kidney dis...    0 Result Notes           Component Ref Range & Units 1 mo ago (10/22/21) 2 mo ago (10/08/21) 2 mo ago (09/25/21) 2 mo ago (09/24/21) 3 mo ago (09/17/21) 3 mo ago (08/20/21) 4 mo ago (07/25/21)  Sodium 135 - 145 mmol/L 135  132 Low   141 CM  136  136  135  133 Low    Potassium 3.5 - 5.1 mmol/L 4.2  4.0  4.4  4.6  4.9  4.4  4.2   Chloride 98 - 111 mmol/L 106  99  115 High   107  101  100  101   CO2 22 - 32 mmol/L '25  24  24  21 '$ Low   '26  27  26   '$ Glucose, Bld 70 - 99 mg/dL 190 High   138 High  CM  95 CM  134 High  CM  152 High  CM  166 High  CM  165 High  CM   Comment: Glucose reference range applies only to samples taken after fasting for at least 8 hours.  BUN 8 - 23 mg/dL 31 High   34 High   23  36 High   39 High   23  26 High    Creatinine, Ser 0.44 - 1.00 mg/dL 1.43 High   1.44 High   0.84  1.43 High   1.33 High   1.13 High   1.47 High    Calcium 8.9 - 10.3 mg/dL 8.5 Low   8.4 Low   7.9 Low   8.6 Low   8.9  8.6 Low   9.0    Total Protein 6.5 - 8.1 g/dL 7.0  6.6  4.9 Low   6.3 Low   7.0  7.0  7.2   Albumin 3.5 - 5.0 g/dL 3.6  3.6  2.9 Low   3.5  3.9  3.8  4.0   AST 15 - 41 U/L '18  18  14 '$ Low   '21  17  20  16   '$ ALT 0 - 44 U/L '11  13  10  11  11  12  11   '$ Alkaline Phosphatase 38 - 126 U/L 98  83  73  88  101  96  101   Total Bilirubin 0.3 - 1.2 mg/dL  0.4  0.5  0.3  0.2 Low   0.4  0.5  0.1 Low    GFR, Estimated >60 mL/min 34 Low   34 Low  CM  >60 CM  34 Low  CM  38 Low  CM  46 Low  CM  33 Low  CM   Comment: (NOTE)  Calculated using the CKD-EPI Creatinine Equation (2021)   Anion gap 5 - 15 4 Low   9 CM  2 Low  CM  8 CM  9 CM  8 CM  6 CM   Comment: Performed at [facility], [address] [place], [place] [address]  Resulting Agency  [facility] [place] [facility]         Specimen Collected: 10/22/21 13:10 Last Resulted: 10/22/21 13:47

## 2021-12-17 NOTE — Telephone Encounter (Signed)
Oral Oncology Patient Advocate Encounter   Began application for assistance for Jakafi through [employer].   Online application has been submitted. Waiting on program to contact and finalize with patient.    [employer] phone number [phone].   I will continue to check the status until final determination.   Berdine Addison, [facility] Patient [place]  [phone] (phone) [phone] (fax)

## 2021-12-17 NOTE — Progress Notes (Signed)
Hematology/Oncology Progress note [phone] [phone]      ASSESSMENT & PLAN:   Myelofibrosis ([place]) Polycythemia vera and  secondary myelofibrosis. Clinically patient is stable. Continue current regimen with Jakafi 10 mg in the morning, 5 mg in the evening.   Continue aspirin 81 mg daily.  Squamous cell cancer of skin of left cheek S/p palliative RT.  Anemia in chronic kidney disease  Anemia due to CKD, and myelofibrosis.   Hemoglobin has improved.  No need for Retacrit.  Orders Placed This Encounter  Procedures   CBC with Differential/Platelet    Standing Status:   Future    Standing Expiration Date:   12/18/2022   Comprehensive metabolic panel    Standing Status:   Future    Standing Expiration Date:   12/18/2022   Follow up in 6 weeks All questions were answered. The patient knows to call the clinic with any problems, questions or concerns.  Earlie Server, MD, PhD [facility] Hematology Oncology 12/17/2021      Chief Complaint: Krisann Mckenna is a 88 y.o. female with polycythemia vera (PV) and secondary myelofibrosis presents for follow up   Tyrone is a 88 y.o.afemale who has above oncology history reviewed by me today presented for follow up visit for management of  polycythemia vera  Patient previously followed up by Dr.Corcoran, patient switched care to me on 11/16/20 Extensive medical record review was performed by me  Feb 2014 polycythemia vera and secondary myelofibrosis.  She presented in 04/2002 with thrombocytosis (835,000).  JAK2 V617F was positive   Bone marrow was on 04/15/2002 revealed a normocellular marrow for age with trilineage hematopoiesis and mild megakaryocytic hyperplasia with focal atypia.  There was no morphologic evidence of a myeloproliferative disorder.  Iron stains were inadequate for evaluation of storage iron.  There was no increase in reticulin fibers.  She was started on  Agrylin in 2012  Bone marrow on 09/25/2012 revealed persistent myeloproliferative disorder s/p Agrylin.  The current features were best classified as persistent myeloproliferative neoplasm (MPN) JAK2 V617F mutation positive.  The current features of markedly increased atypical megakaryocytes paresis, with frequent clustering with frequent hyper/deeply lobulated forms, bizarre forms and forms with staghorn nuclei, hyperchromatic forms and abundant cytoplasm was highly suggestive of a myeloproliferative neoplasm.  Marrow was hypercellular for age (30-50%) with increased trilineage hematopoiesis and markedly increased atypical megakaryopoiesis with frequent clustering.  There were no increased blasts.  Iron stores were absent.  There was moderate to severe myelofibrosis (grade MF 2-3).  Differential includes ET, PV, primary myelofibrosis, and MPN, unclassifiable.  Cytogenetics were normal (3, XX). She developed pancytopenia in 12/2015.  Etiology is unclear (medication confusion or hydroxyurea + Agrylin).   She received Procrit 40,000 units (intermittently 09/2012 - 06/2016), Venofer (09/2012 - 10/2012), and Infed (12/2015 - 03/2016).  She received Neupogen during a period of neutropenia.   She was anagrelide from 2004 - 08/13/2017.  She began [place] on 08/14/2017.  Jakafi was increased from 5 mg BID to 10 mg BID on 06/17/2018.  Jakafi was increased to 15 mg BID on 08/03/2018.  She has been back on Jakafi 5 mg BID since 02/01/2019 then 10 mg BID on 05/25/2019.  [name] was on hold from 12/20/2019 - 01/12/2020.  Jakafi 5 mg twice daily restarted on 01/12/2020 then increased to 10 mg in the morning and 5 mg in the evening on 02/02/2020. [name] is currently 20 mg po BID.  She has received Retacrit to maintain a  hemoglobin > 10 (12/24/2019 and 03   Squamous cell carcinoma left cheek- s/p cryotherapy and debridement. Managed by Dr. Nehemiah Massed  10/22/2020- 10/26/2020 Hospitalization due to acute metabolic  encephalopathy, UTI,E. coli bacteremia AKI on CKD, acute on chronic anemia. [name] was held during the admission. She was discharged and spent a short period of time in rehab. [name] was held.   11/06/2020 seen by symptom management for weakness.  11/16/2020 established care with me. Resumed Jakafi at '10mg'$  BID  #Dementia/cognitive impairment- patient has hx of dementia. Per daughter, it has been difficult to convince patent to see neurology for further evaluation. She is not on any medication for her memory loss.  She has been referred to home health.  06/25/2021 - 07/02/2021,Patient was hospitalized due to UTI, generalized weakness Jakafi was.  Held. 07/25/2021, resumed on Jakafi 5 mg daily.  07/25/2021, bilateral lower extremity ultrasound were negative for DVT. 08/20/2021, increased Jakafi to 5 mg twice daily. INTERVAL HISTORY Bitha Fauteux is a 88 y.o. female who has above history reviewed by me today presents for follow up visit for management of polycythemia vera and secondary myelofibrosis.   Patient was accompanied by daughter [name]  history of squamous cell carcinoma on her left mid cheek s/p radiation Patient is a poor historian.  She currently lives in a facility. No new complaints. today  Past Medical History:  Diagnosis Date   Acute metabolic encephalopathy 24/26/8341   Confusion 12/30/2018   Polycythemia    SCC (squamous cell carcinoma) 04/11/2021   R med dorsum hand - ED&C SCCIS   SCC (squamous cell carcinoma) 10/09/2021   left lateral eye, EDC   Squamous cell carcinoma of skin 09/06/2020   R thumb - ED&C   Urinary tract infection without hematuria 01/02/2020   Urticaria 06/02/2017    Past Surgical History:  Procedure Laterality Date   ABDOMINAL HYSTERECTOMY     complete   BREAST SURGERY      Family History  Problem Relation Age of Onset   Heart disease Father    Cancer Sister        breast   Cancer Sister        breast   Alzheimer's disease Sister      Social History:  reports that she has never smoked. She has never used smokeless tobacco. She reports that she does not drink alcohol and does not use drugs.   Allergies:  Allergies  Allergen Reactions   Bactrim [Sulfamethoxazole-Trimethoprim]     Due to kidney disease    Epinephrine Other (See Comments)    Current Medications: Current Outpatient Medications  Medication Sig Dispense Refill   acetaminophen (TYLENOL) 325 MG tablet Take 325-650 mg by mouth every 6 (six) hours as needed for mild pain or moderate pain.     aspirin EC 81 MG tablet Take 81 mg by mouth daily. Swallow whole.     calcitRIOL (ROCALTROL) 0.25 MCG capsule Take 0.25 mcg by mouth every Monday, Wednesday, and Friday.     citalopram (CELEXA) 10 MG tablet Take 1 tablet (10 mg total) by mouth daily. 30 tablet 2   feeding supplement (ENSURE ENLIVE / ENSURE PLUS) LIQD Take 237 mLs by mouth 2 (two) times daily between meals. 237 mL 12   mupirocin ointment (BACTROBAN) 2 % Apply to wound every other day 22 g 1   polyethylene glycol (MIRALAX / GLYCOLAX) 17 g packet Take 17 g by mouth daily as needed for mild constipation. 14 each 0   VELTASSA 16.8 g  PACK Take by mouth.     vitamin B-12 (CYANOCOBALAMIN) 1000 MCG tablet TAKE 1 TABLET BY MOUTH DAILY 30 tablet 1   ruxolitinib phosphate (JAKAFI) 5 MG tablet Take 2 tablets ('10mg'$ ) in morning and take 1 tablet ('5mg'$ ) at night. 90 tablet 1   No current facility-administered medications for this visit.    Review of Systems  Unable to perform ROS: Dementia    Performance status (ECOG):  2  Vitals Blood pressure 131/65, pulse 85, temperature (!) 96.8 F (36 C), temperature source Tympanic, height 5' (1.524 m), weight 118 lb 12.8 oz (53.9 kg), SpO2 97 %.   Physical Exam Vitals reviewed.  Constitutional:      General: She is not in acute distress.    Appearance: She is well-developed.     Comments: Frail appearance, patient sits in the wheelchair  HENT:     Head:  Normocephalic and atraumatic.     Nose: Nose normal. No congestion.     Mouth/Throat:     Pharynx: No oropharyngeal exudate.  Eyes:     General: No scleral icterus.    Conjunctiva/sclera: Conjunctivae normal.  Cardiovascular:     Rate and Rhythm: Normal rate and regular rhythm.     Pulses: Normal pulses.     Heart sounds: No murmur heard. Pulmonary:     Effort: Pulmonary effort is normal. No respiratory distress.  Abdominal:     General: There is no distension.     Tenderness: There is no abdominal tenderness.  Musculoskeletal:        General: No deformity. Normal range of motion.     Right lower leg: Edema present.     Left lower leg: No edema.  Skin:    General: Skin is warm and dry.     Coloration: Skin is not pale.     Comments: Left cheek skin lesion  Neurological:     Mental Status: She is alert. Mental status is at baseline.  Psychiatric:        Attention and Perception: Attention normal.        Mood and Affect: Mood and affect normal.        Behavior: Behavior is cooperative.        Cognition and Memory: Cognition is impaired. Memory is impaired.    Labs    Latest Ref Rng & Units 12/17/2021    2:35 PM 11/19/2021    3:09 PM 10/22/2021    1:10 PM  CBC  WBC 4.0 - 10.5 K/uL 11.4  10.2  11.1   Hemoglobin 12.0 - 15.0 g/dL 10.8  10.7  10.5   Hematocrit 36.0 - 46.0 % 33.9  34.4  34.3   Platelets 150 - 400 K/uL 644  660  744       Latest Ref Rng & Units 12/17/2021    2:35 PM 10/22/2021    1:10 PM 10/08/2021    2:20 PM  CMP  Glucose 70 - 99 mg/dL 126  190  138   BUN 8 - 23 mg/dL 30  31  34   Creatinine 0.44 - 1.00 mg/dL 1.36  1.43  1.44   Sodium 135 - 145 mmol/L 138  135  132   Potassium 3.5 - 5.1 mmol/L 3.8  4.2  4.0   Chloride 98 - 111 mmol/L 104  106  99   CO2 22 - 32 mmol/L '27  25  24   '$ Calcium 8.9 - 10.3 mg/dL 8.9  8.5  8.4   Total  Protein 6.5 - 8.1 g/dL 7.1  7.0  6.6   Total Bilirubin 0.3 - 1.2 mg/dL 0.3  0.4  0.5   Alkaline Phos 38 - 126 U/L 95  98  83    AST 15 - 41 U/L '17  18  18   '$ ALT 0 - 44 U/L 12  11  13

## 2021-12-17 NOTE — Assessment & Plan Note (Signed)
Polycythemia vera and  secondary myelofibrosis. Clinically patient is stable. Continue current regimen with Jakafi 10 mg in the morning, 5 mg in the evening.   Continue aspirin 81 mg daily. 

## 2021-12-18 ENCOUNTER — Other Ambulatory Visit (HOSPITAL_COMMUNITY): Payer: Self-pay

## 2021-12-18 DIAGNOSIS — C4490 Unspecified malignant neoplasm of skin, unspecified: Secondary | ICD-10-CM | POA: Diagnosis not present

## 2021-12-18 DIAGNOSIS — N1832 Chronic kidney disease, stage 3b: Secondary | ICD-10-CM | POA: Diagnosis not present

## 2021-12-18 DIAGNOSIS — D631 Anemia in chronic kidney disease: Secondary | ICD-10-CM | POA: Diagnosis not present

## 2021-12-18 DIAGNOSIS — S81811D Laceration without foreign body, right lower leg, subsequent encounter: Secondary | ICD-10-CM | POA: Diagnosis not present

## 2021-12-18 DIAGNOSIS — F39 Unspecified mood [affective] disorder: Secondary | ICD-10-CM | POA: Diagnosis not present

## 2021-12-18 DIAGNOSIS — D45 Polycythemia vera: Secondary | ICD-10-CM | POA: Diagnosis not present

## 2021-12-19 ENCOUNTER — Telehealth: Payer: Self-pay | Admitting: *Deleted

## 2021-12-19 ENCOUNTER — Non-Acute Institutional Stay: Payer: Medicare Other | Admitting: Student

## 2021-12-19 DIAGNOSIS — Z515 Encounter for palliative care: Secondary | ICD-10-CM | POA: Diagnosis not present

## 2021-12-19 DIAGNOSIS — D45 Polycythemia vera: Secondary | ICD-10-CM | POA: Diagnosis not present

## 2021-12-19 DIAGNOSIS — D7581 Myelofibrosis: Secondary | ICD-10-CM | POA: Diagnosis not present

## 2021-12-19 DIAGNOSIS — R5381 Other malaise: Secondary | ICD-10-CM | POA: Diagnosis not present

## 2021-12-19 DIAGNOSIS — D0439 Carcinoma in situ of skin of other parts of face: Secondary | ICD-10-CM

## 2021-12-19 DIAGNOSIS — C4490 Unspecified malignant neoplasm of skin, unspecified: Secondary | ICD-10-CM | POA: Diagnosis not present

## 2021-12-19 DIAGNOSIS — N1832 Chronic kidney disease, stage 3b: Secondary | ICD-10-CM | POA: Diagnosis not present

## 2021-12-19 DIAGNOSIS — F39 Unspecified mood [affective] disorder: Secondary | ICD-10-CM | POA: Diagnosis not present

## 2021-12-19 DIAGNOSIS — D631 Anemia in chronic kidney disease: Secondary | ICD-10-CM | POA: Diagnosis not present

## 2021-12-19 DIAGNOSIS — S81811D Laceration without foreign body, right lower leg, subsequent encounter: Secondary | ICD-10-CM | POA: Diagnosis not present

## 2021-12-19 NOTE — Telephone Encounter (Signed)
Received on (12/17/2021) via of fax Hall from [facility] requesting sign,date,and return.  Given to provider to sign,date, and return.     Home Health Certification and Plan of Care signed,dated,and faxed back to [facility].  Confirmation received and copy scanned into the chart.//AB/CMA

## 2021-12-19 NOTE — Progress Notes (Signed)
[facility] Palliative Care Consult Note Telephone: [phone]  Fax: [phone]    Date of encounter: 12/19/21  PATIENT NAME: Laura Wilcox [address] [place] [address]   [phone] (home)  DOB: 18-Jun-1926 MRN: [id number] PRIMARY CARE PROVIDER:    Glean Hess, MD,  [address] [place] [address] [phone]  REFERRING PROVIDER:   Glean Hess, MD [address] [place],  [place] [address] [phone]  RESPONSIBLE PARTY:    Contact Information     Name Relation Home Work Mobile   Laura Wilcox Daughter   [phone]   Laura Wilcox, Laura Wilcox   [phone]   watts,[place]herwood Relative   [phone]        I met face to face with patient in the facility. Palliative Care was asked to follow this patient by consultation request of  Altha Harm, NP to address advance care planning and complex medical decision making. This is a follow up visit.                                   ASSESSMENT AND PLAN / RECOMMENDATIONS:   Advance Care Planning/Goals of Care: Goals include to maximize quality of life and symptom management. Patient/health care surrogate gave his/her permission to discuss.  CODE STATUS: DNR  Symptom Management/Plan:  Polycythemia vera, myelofibrosis-continue Jakafi 29m each morning and 5 mg each evening as directed. Follow up with oncology as scheduled.   SCC-left cheek; s/p radiation. Patient to follow up with dermatologist and oncologist as scheduled.   RLE wound- HH SN is assisting with wound care. Patient to follow up with plastic surgery as scheduled next week.   Debility-secondary to her dementia. Staff to continue assisting with adl's. Patient is receiving therapy per staff. Monitor for falls/safety.  Follow up Palliative Care Visit: Palliative care will continue to follow for complex medical decision making, advance care planning, and clarification of goals. Return in 6-8  weeks or prn.   This visit was coded based on medical decision making (MDM).  PPS: 50%  HOSPICE ELIGIBILITY/DIAGNOSIS: TBD  Chief Complaint: Palliative Medicine follow up visit.   HISTORY OF PRESENT ILLNESS:  PDalissa Lovinis a 88y.o. year old female  with dementia, protein calorie malnutrition, weakness, depression, CKD 3b, polycythemia vera, myelofibrosis, anemia of chronic disease, SCC of left cheek. Patient hospitalized 6/26-6/27 due to skin tear/leg wound of right lower extremity.   [facility]. Patient is stable per staff. She has been receiving HH therapy and SN due to leg wound. She requires assistance of staff for adl's. Good appetite reported. Staff report she is sleeping more throughout the day. No recent falls or injury. Patient received resting in bed; she arouses to stimulation. She denies pain, shortness of breath, nausea, constipation. Staff contributes to HPI and ROS due to her dementia.   History obtained from review of EMR, discussion with primary team, and interview with family, facility staff/caregiver and/or Ms. VDeirdre Wilcox  I reviewed available labs, medications, imaging, studies and related documents from the EMR.  Records reviewed and summarized above.   Physical Exam: Pulse 72, resp 16 sats 96% on room air Constitutional: NAD General: frail appearing EYES: anicteric sclera, lids intact, no discharge  ENMT: intact hearing, oral mucous membranes moist CV: S1S2, RRR Pulmonary: LCTA, no increased work of breathing, no cough, room air Abdomen:  normo-active BS +  4 quadrants, soft and non tender GU: deferred MSK: moves all extremities Skin: warm and dry, no rashes; raised lesion to left cheek, dressing CDI to RLE Neuro:  + generalized weakness, + cognitive impairment Psych: non-anxious affect, A and O x to person Hem/lymph/immuno: no widespread bruising   Thank you for the opportunity to participate in the care  of Laura Wilcox.  The palliative care team will continue to follow. Please call our office at [phone] if we can be of additional assistance.   Ezekiel Slocumb, NP   COVID-19 PATIENT SCREENING TOOL Asked and negative response unless otherwise noted:   Have you had symptoms of covid, tested positive or been in contact with someone with symptoms/positive test in the past 5-10 days? No

## 2021-12-20 ENCOUNTER — Other Ambulatory Visit (HOSPITAL_COMMUNITY): Payer: Self-pay

## 2021-12-21 DIAGNOSIS — F02B0 Dementia in other diseases classified elsewhere, moderate, without behavioral disturbance, psychotic disturbance, mood disturbance, and anxiety: Secondary | ICD-10-CM | POA: Diagnosis not present

## 2021-12-21 DIAGNOSIS — F4329 Adjustment disorder with other symptoms: Secondary | ICD-10-CM | POA: Diagnosis not present

## 2021-12-25 DIAGNOSIS — D45 Polycythemia vera: Secondary | ICD-10-CM | POA: Diagnosis not present

## 2021-12-25 DIAGNOSIS — N1832 Chronic kidney disease, stage 3b: Secondary | ICD-10-CM | POA: Diagnosis not present

## 2021-12-25 DIAGNOSIS — F39 Unspecified mood [affective] disorder: Secondary | ICD-10-CM | POA: Diagnosis not present

## 2021-12-25 DIAGNOSIS — S81811D Laceration without foreign body, right lower leg, subsequent encounter: Secondary | ICD-10-CM | POA: Diagnosis not present

## 2021-12-25 DIAGNOSIS — C4490 Unspecified malignant neoplasm of skin, unspecified: Secondary | ICD-10-CM | POA: Diagnosis not present

## 2021-12-25 DIAGNOSIS — D631 Anemia in chronic kidney disease: Secondary | ICD-10-CM | POA: Diagnosis not present

## 2021-12-26 ENCOUNTER — Encounter: Payer: Self-pay | Admitting: Surgical

## 2021-12-26 ENCOUNTER — Ambulatory Visit (INDEPENDENT_AMBULATORY_CARE_PROVIDER_SITE_OTHER): Payer: Medicare Other | Admitting: Physician Assistant

## 2021-12-26 DIAGNOSIS — D692 Other nonthrombocytopenic purpura: Secondary | ICD-10-CM | POA: Diagnosis not present

## 2021-12-26 DIAGNOSIS — S81811D Laceration without foreign body, right lower leg, subsequent encounter: Secondary | ICD-10-CM

## 2021-12-26 DIAGNOSIS — F331 Major depressive disorder, recurrent, moderate: Secondary | ICD-10-CM | POA: Diagnosis not present

## 2021-12-26 NOTE — Progress Notes (Signed)
   Referring Provider Glean Hess, MD [address] [place],  [place] [address]   CC:  Chief Complaint  Patient presents with   Follow-up      Laura Wilcox is an 88 y.o. female.   HPI: This is a 88 year old female seen in our office for follow-up evaluation status post skin tear to right lower leg.  She was most recently seen in the office on 12/12/2021.  Her wound was originally repaired in emergency room sutures.  She was started on endoform and switched over to [facility] M.  Since her last office visit she denies any significant complaints or concerns.  She is accompanied by her daughter who denies any issues either.  They have not addressed the wound other than taking off the bandage over top.  Review of Systems General: Negative for fever  Physical Exam    12/17/2021    2:49 PM 12/17/2021    2:36 PM 11/19/2021    2:29 PM  Vitals with BMI  Height '5\' 0"'$  '5\' 0"'$    Weight 118 lbs 13 oz    BMI 02.6    Systolic 378  588  Diastolic 65  72  Pulse 85  78    General:  No acute distress,  Alert and oriented, Non-Toxic, Normal speech and affect Right lower extremity with an approximate 2 cm wound remaining with good granulation tissue, no surrounding redness discharge, she has a separate wound approximately 1 cm x 1 cm.      Assessment/Plan  She this is a 88 year old female seen in our office for follow-up evaluation of a chronic wound to her right lower extremity.  Overall she is doing very well, her wound has dramatically improved since her last office visit.  I did remove the rile on single-sided nonadherent wound dressing today.  I then placed the supra SDR M. Lot number [id number] followed by the rile on 1 Lot number [id number], this was donated.  The patient will follow back in our office in 2 weeks for repeat evaluation or sooner as needed.  Laura Wilcox 12/26/2021, 4:08 PM

## 2021-12-27 DIAGNOSIS — F02B0 Dementia in other diseases classified elsewhere, moderate, without behavioral disturbance, psychotic disturbance, mood disturbance, and anxiety: Secondary | ICD-10-CM | POA: Diagnosis not present

## 2021-12-27 DIAGNOSIS — C4490 Unspecified malignant neoplasm of skin, unspecified: Secondary | ICD-10-CM | POA: Diagnosis not present

## 2021-12-27 DIAGNOSIS — F39 Unspecified mood [affective] disorder: Secondary | ICD-10-CM | POA: Diagnosis not present

## 2021-12-27 DIAGNOSIS — G301 Alzheimer's disease with late onset: Secondary | ICD-10-CM | POA: Diagnosis not present

## 2021-12-27 DIAGNOSIS — F331 Major depressive disorder, recurrent, moderate: Secondary | ICD-10-CM | POA: Diagnosis not present

## 2021-12-27 DIAGNOSIS — R269 Unspecified abnormalities of gait and mobility: Secondary | ICD-10-CM | POA: Diagnosis not present

## 2021-12-27 DIAGNOSIS — N1832 Chronic kidney disease, stage 3b: Secondary | ICD-10-CM | POA: Diagnosis not present

## 2021-12-27 DIAGNOSIS — D631 Anemia in chronic kidney disease: Secondary | ICD-10-CM | POA: Diagnosis not present

## 2021-12-27 DIAGNOSIS — S81811D Laceration without foreign body, right lower leg, subsequent encounter: Secondary | ICD-10-CM | POA: Diagnosis not present

## 2021-12-27 DIAGNOSIS — D45 Polycythemia vera: Secondary | ICD-10-CM | POA: Diagnosis not present

## 2021-12-27 DIAGNOSIS — D692 Other nonthrombocytopenic purpura: Secondary | ICD-10-CM | POA: Diagnosis not present

## 2021-12-27 DIAGNOSIS — C4339 Malignant melanoma of other parts of face: Secondary | ICD-10-CM | POA: Diagnosis not present

## 2021-12-27 DIAGNOSIS — K5901 Slow transit constipation: Secondary | ICD-10-CM | POA: Diagnosis not present

## 2021-12-28 ENCOUNTER — Encounter: Payer: Self-pay | Admitting: Hematology and Oncology

## 2021-12-28 ENCOUNTER — Other Ambulatory Visit (HOSPITAL_COMMUNITY): Payer: Self-pay

## 2021-12-28 NOTE — Telephone Encounter (Signed)
Oral Oncology Patient Advocate Encounter   Was successful in securing patient an $10,500 grant from Patient [facility]) to provide copayment coverage for Jakafi.  This will keep the out of pocket expense at $0.     I have spoken with the patient.    The billing information is as follows and has been shared with [place].   Member ID: [id number] Group ID: [id number] RxBin: [id number] RxPCN: [id number] Dates of Eligibility: 07.01.23 through 06.30.24  Fund:  [place] Chromosome Negative Myeloproliferative Neoplasm  Berdine Addison, [facility] Patient [place]  [phone] (phone) [phone] (fax)

## 2022-01-01 DIAGNOSIS — D45 Polycythemia vera: Secondary | ICD-10-CM | POA: Diagnosis not present

## 2022-01-01 DIAGNOSIS — N1832 Chronic kidney disease, stage 3b: Secondary | ICD-10-CM | POA: Diagnosis not present

## 2022-01-01 DIAGNOSIS — D631 Anemia in chronic kidney disease: Secondary | ICD-10-CM | POA: Diagnosis not present

## 2022-01-01 DIAGNOSIS — S81811D Laceration without foreign body, right lower leg, subsequent encounter: Secondary | ICD-10-CM | POA: Diagnosis not present

## 2022-01-01 DIAGNOSIS — F39 Unspecified mood [affective] disorder: Secondary | ICD-10-CM | POA: Diagnosis not present

## 2022-01-01 DIAGNOSIS — C4490 Unspecified malignant neoplasm of skin, unspecified: Secondary | ICD-10-CM | POA: Diagnosis not present

## 2022-01-02 DIAGNOSIS — D631 Anemia in chronic kidney disease: Secondary | ICD-10-CM | POA: Diagnosis not present

## 2022-01-02 DIAGNOSIS — N1832 Chronic kidney disease, stage 3b: Secondary | ICD-10-CM | POA: Diagnosis not present

## 2022-01-02 DIAGNOSIS — D45 Polycythemia vera: Secondary | ICD-10-CM | POA: Diagnosis not present

## 2022-01-02 DIAGNOSIS — C4490 Unspecified malignant neoplasm of skin, unspecified: Secondary | ICD-10-CM | POA: Diagnosis not present

## 2022-01-02 DIAGNOSIS — S81811D Laceration without foreign body, right lower leg, subsequent encounter: Secondary | ICD-10-CM | POA: Diagnosis not present

## 2022-01-02 DIAGNOSIS — F39 Unspecified mood [affective] disorder: Secondary | ICD-10-CM | POA: Diagnosis not present

## 2022-01-04 DIAGNOSIS — R300 Dysuria: Secondary | ICD-10-CM | POA: Diagnosis not present

## 2022-01-07 DIAGNOSIS — F39 Unspecified mood [affective] disorder: Secondary | ICD-10-CM | POA: Diagnosis not present

## 2022-01-07 DIAGNOSIS — D45 Polycythemia vera: Secondary | ICD-10-CM | POA: Diagnosis not present

## 2022-01-07 DIAGNOSIS — D631 Anemia in chronic kidney disease: Secondary | ICD-10-CM | POA: Diagnosis not present

## 2022-01-07 DIAGNOSIS — C4490 Unspecified malignant neoplasm of skin, unspecified: Secondary | ICD-10-CM | POA: Diagnosis not present

## 2022-01-07 DIAGNOSIS — S81811D Laceration without foreign body, right lower leg, subsequent encounter: Secondary | ICD-10-CM | POA: Diagnosis not present

## 2022-01-07 DIAGNOSIS — N1832 Chronic kidney disease, stage 3b: Secondary | ICD-10-CM | POA: Diagnosis not present

## 2022-01-08 DIAGNOSIS — F39 Unspecified mood [affective] disorder: Secondary | ICD-10-CM | POA: Diagnosis not present

## 2022-01-08 DIAGNOSIS — D45 Polycythemia vera: Secondary | ICD-10-CM | POA: Diagnosis not present

## 2022-01-08 DIAGNOSIS — C4490 Unspecified malignant neoplasm of skin, unspecified: Secondary | ICD-10-CM | POA: Diagnosis not present

## 2022-01-08 DIAGNOSIS — D631 Anemia in chronic kidney disease: Secondary | ICD-10-CM | POA: Diagnosis not present

## 2022-01-08 DIAGNOSIS — S81811D Laceration without foreign body, right lower leg, subsequent encounter: Secondary | ICD-10-CM | POA: Diagnosis not present

## 2022-01-08 DIAGNOSIS — N1832 Chronic kidney disease, stage 3b: Secondary | ICD-10-CM | POA: Diagnosis not present

## 2022-01-09 ENCOUNTER — Ambulatory Visit (INDEPENDENT_AMBULATORY_CARE_PROVIDER_SITE_OTHER): Payer: Medicare Other | Admitting: Physician Assistant

## 2022-01-09 ENCOUNTER — Encounter: Payer: Self-pay | Admitting: Physician Assistant

## 2022-01-09 DIAGNOSIS — C4490 Unspecified malignant neoplasm of skin, unspecified: Secondary | ICD-10-CM | POA: Diagnosis not present

## 2022-01-09 DIAGNOSIS — N2581 Secondary hyperparathyroidism of renal origin: Secondary | ICD-10-CM | POA: Diagnosis not present

## 2022-01-09 DIAGNOSIS — S81811D Laceration without foreign body, right lower leg, subsequent encounter: Secondary | ICD-10-CM

## 2022-01-09 DIAGNOSIS — F39 Unspecified mood [affective] disorder: Secondary | ICD-10-CM | POA: Diagnosis not present

## 2022-01-09 DIAGNOSIS — N1832 Chronic kidney disease, stage 3b: Secondary | ICD-10-CM | POA: Diagnosis not present

## 2022-01-09 DIAGNOSIS — D45 Polycythemia vera: Secondary | ICD-10-CM | POA: Diagnosis not present

## 2022-01-09 DIAGNOSIS — Z87440 Personal history of urinary (tract) infections: Secondary | ICD-10-CM | POA: Diagnosis not present

## 2022-01-09 DIAGNOSIS — R32 Unspecified urinary incontinence: Secondary | ICD-10-CM | POA: Diagnosis not present

## 2022-01-09 DIAGNOSIS — F321 Major depressive disorder, single episode, moderate: Secondary | ICD-10-CM | POA: Diagnosis not present

## 2022-01-09 DIAGNOSIS — Z9181 History of falling: Secondary | ICD-10-CM | POA: Diagnosis not present

## 2022-01-09 DIAGNOSIS — D631 Anemia in chronic kidney disease: Secondary | ICD-10-CM | POA: Diagnosis not present

## 2022-01-09 DIAGNOSIS — E440 Moderate protein-calorie malnutrition: Secondary | ICD-10-CM | POA: Diagnosis not present

## 2022-01-09 NOTE — Progress Notes (Signed)
   Referring Provider Glean Hess, MD [address] [place],   [address]   CC:  Chief Complaint  Patient presents with   Follow-up      Laura Wilcox is an 88 y.o. female.  HPI:    This is a 88 year old female seen in our office for follow-up evaluation status post skin tear to right lower leg.  She was most recently seen in the office on 12/12/2021.  Her wound was originally repaired in emergency room sutures.  She was started on endoform and switched over to [facility].  Since her last office visit she denies any significant complaints or concerns.  She is accompanied by her daughter who denies any issues either.  They have not addressed the wound other than taking off the bandage over top.   Review of Systems General: negative for fever  Physical Exam    12/17/2021    2:49 PM 12/17/2021    2:36 PM 11/19/2021    2:29 PM  Vitals with BMI  Height '5\' 0"'$  '5\' 0"'$    Weight 118 lbs 13 oz    BMI 26.2    Systolic 035  597  Diastolic 65  72  Pulse 85  78    General:  No acute distress,  Alert and oriented, Non-Toxic, Normal speech and affect Right lower extremity with a 1x1cm superficial wound, another smaller pressure wound noted distally, no surrounding redness or discharge   Assessment/Plan  Ms. Laura Wilcox has done very well with her healing. The wound is nearly healed with the exception of a small superficial area. A this point I would recommend non stick dressing with Surgilube and daily dressing changes. She will follow up in our clinic in two weeks for follow up evaluations. She will follow up sooner if needed. Both the patient and her daughter agreed to today's plan.   Stevie Kern Taylyn Brame 01/09/2022, 1:49 PM

## 2022-01-10 DIAGNOSIS — N3000 Acute cystitis without hematuria: Secondary | ICD-10-CM | POA: Diagnosis not present

## 2022-01-10 DIAGNOSIS — F02B0 Dementia in other diseases classified elsewhere, moderate, without behavioral disturbance, psychotic disturbance, mood disturbance, and anxiety: Secondary | ICD-10-CM | POA: Diagnosis not present

## 2022-01-10 DIAGNOSIS — N1832 Chronic kidney disease, stage 3b: Secondary | ICD-10-CM | POA: Diagnosis not present

## 2022-01-10 DIAGNOSIS — G301 Alzheimer's disease with late onset: Secondary | ICD-10-CM | POA: Diagnosis not present

## 2022-01-15 ENCOUNTER — Other Ambulatory Visit (HOSPITAL_COMMUNITY): Payer: Self-pay

## 2022-01-15 DIAGNOSIS — F39 Unspecified mood [affective] disorder: Secondary | ICD-10-CM | POA: Diagnosis not present

## 2022-01-15 DIAGNOSIS — C4490 Unspecified malignant neoplasm of skin, unspecified: Secondary | ICD-10-CM | POA: Diagnosis not present

## 2022-01-15 DIAGNOSIS — N1832 Chronic kidney disease, stage 3b: Secondary | ICD-10-CM | POA: Diagnosis not present

## 2022-01-15 DIAGNOSIS — D45 Polycythemia vera: Secondary | ICD-10-CM | POA: Diagnosis not present

## 2022-01-15 DIAGNOSIS — S81811D Laceration without foreign body, right lower leg, subsequent encounter: Secondary | ICD-10-CM | POA: Diagnosis not present

## 2022-01-15 DIAGNOSIS — D631 Anemia in chronic kidney disease: Secondary | ICD-10-CM | POA: Diagnosis not present

## 2022-01-16 ENCOUNTER — Other Ambulatory Visit (HOSPITAL_COMMUNITY): Payer: Self-pay

## 2022-01-21 ENCOUNTER — Other Ambulatory Visit (HOSPITAL_COMMUNITY): Payer: Self-pay

## 2022-01-22 ENCOUNTER — Ambulatory Visit (INDEPENDENT_AMBULATORY_CARE_PROVIDER_SITE_OTHER): Payer: Medicare Other | Admitting: Dermatology

## 2022-01-22 DIAGNOSIS — C44329 Squamous cell carcinoma of skin of other parts of face: Secondary | ICD-10-CM

## 2022-01-22 DIAGNOSIS — D0439 Carcinoma in situ of skin of other parts of face: Secondary | ICD-10-CM | POA: Diagnosis not present

## 2022-01-22 DIAGNOSIS — Z85828 Personal history of other malignant neoplasm of skin: Secondary | ICD-10-CM | POA: Diagnosis not present

## 2022-01-22 DIAGNOSIS — L578 Other skin changes due to chronic exposure to nonionizing radiation: Secondary | ICD-10-CM

## 2022-01-22 DIAGNOSIS — R296 Repeated falls: Secondary | ICD-10-CM | POA: Diagnosis not present

## 2022-01-22 DIAGNOSIS — M6281 Muscle weakness (generalized): Secondary | ICD-10-CM | POA: Diagnosis not present

## 2022-01-22 DIAGNOSIS — D485 Neoplasm of uncertain behavior of skin: Secondary | ICD-10-CM

## 2022-01-22 DIAGNOSIS — C441292 Squamous cell carcinoma of skin of left lower eyelid, including canthus: Secondary | ICD-10-CM | POA: Diagnosis not present

## 2022-01-22 DIAGNOSIS — R278 Other lack of coordination: Secondary | ICD-10-CM | POA: Diagnosis not present

## 2022-01-22 DIAGNOSIS — N1832 Chronic kidney disease, stage 3b: Secondary | ICD-10-CM | POA: Diagnosis not present

## 2022-01-22 DIAGNOSIS — R2681 Unsteadiness on feet: Secondary | ICD-10-CM | POA: Diagnosis not present

## 2022-01-22 DIAGNOSIS — G301 Alzheimer's disease with late onset: Secondary | ICD-10-CM | POA: Diagnosis not present

## 2022-01-22 DIAGNOSIS — C44320 Squamous cell carcinoma of skin of unspecified parts of face: Secondary | ICD-10-CM

## 2022-01-22 NOTE — Patient Instructions (Addendum)
Wound Care Instructions  Cleanse wound gently with soap and water once a day then pat dry with clean gauze. Apply a thin coat of Petrolatum (petroleum jelly, "Vaseline") over the wound (unless you have an allergy to this). We recommend that you use a new, sterile tube of Vaseline. Do not pick or remove scabs. Do not remove the yellow or white "healing tissue" from the base of the wound.  Cover the wound with fresh, clean, nonstick gauze and secure with paper tape. You may use Band-Aids in place of gauze and tape if the wound is small enough, but would recommend trimming much of the tape off as there is often too much. Sometimes Band-Aids can irritate the skin.  You should call the office for your biopsy report after 1 week if you have not already been contacted.  If you experience any problems, such as abnormal amounts of bleeding, swelling, significant bruising, significant pain, or evidence of infection, please call the office immediately.  FOR ADULT SURGERY PATIENTS: If you need something for pain relief you may take 1 extra strength Tylenol (acetaminophen) AND 2 Ibuprofen (200mg each) together every 4 hours as needed for pain. (do not take these if you are allergic to them or if you have a reason you should not take them.) Typically, you may only need pain medication for 1 to 3 days.     Due to recent changes in healthcare laws, you may see results of your pathology and/or laboratory studies on MyChart before the doctors have had a chance to review them. We understand that in some cases there may be results that are confusing or concerning to you. Please understand that not all results are received at the same time and often the doctors may need to interpret multiple results in order to provide you with the best plan of care or course of treatment. Therefore, we ask that you please give us 2 business days to thoroughly review all your results before contacting the office for clarification. Should  we see a critical lab result, you will be contacted sooner.   If You Need Anything After Your Visit  If you have any questions or concerns for your doctor, please call our main line at [phone] and press option 4 to reach your doctor's medical assistant. If no one answers, please leave a voicemail as directed and we will return your call as soon as possible. Messages left after 4 pm will be answered the following business day.   You may also send us a message via MyChart. We typically respond to MyChart messages within 1-2 business days.  For prescription refills, please ask your pharmacy to contact our office. Our fax number is [phone].  If you have an urgent issue when the clinic is closed that cannot wait until the next business day, you can page your doctor at the number below.    Please note that while we do our best to be available for urgent issues outside of office hours, we are not available 24/7.   If you have an urgent issue and are unable to reach us, you may choose to seek medical care at your doctor's office, retail clinic, urgent care center, or emergency room.  If you have a medical emergency, please immediately call 911 or go to the emergency department.  Pager Numbers  - Dr. Kowalski: [phone]  - Dr. Moye: [phone]  - Dr. Stewart: [phone]  In the event of inclement weather, please call our main line at   [phone] for an update on the status of any delays or closures.  Dermatology Medication Tips: Please keep the boxes that topical medications come in in order to help keep track of the instructions about where and how to use these. Pharmacies typically print the medication instructions only on the boxes and not directly on the medication tubes.   If your medication is too expensive, please contact our office at [phone] option 4 or send us a message through MyChart.   We are unable to tell what your co-pay for medications will be in  advance as this is different depending on your insurance coverage. However, we may be able to find a substitute medication at lower cost or fill out paperwork to get insurance to cover a needed medication.   If a prior authorization is required to get your medication covered by your insurance company, please allow us 1-2 business days to complete this process.  Drug prices often vary depending on where the prescription is filled and some pharmacies may offer cheaper prices.  The website www.goodrx.com contains coupons for medications through different pharmacies. The prices here do not account for what the cost may be with help from insurance (it may be cheaper with your insurance), but the website can give you the price if you did not use any insurance.  - You can print the associated coupon and take it with your prescription to the pharmacy.  - You may also stop by our office during regular business hours and pick up a GoodRx coupon card.  - If you need your prescription sent electronically to a different pharmacy, notify our office through [facility] MyChart or by phone at [phone] option 4.     Si Usted Necesita Algo Despus de Su Visita  Tambin puede enviarnos un mensaje a travs de MyChart. Por lo general respondemos a los mensajes de MyChart en el transcurso de 1 a 2 das hbiles.  Para renovar recetas, por favor pida a su farmacia que se ponga en contacto con nuestra oficina. Nuestro nmero de fax es el [phone].  Si tiene un asunto urgente cuando la clnica est cerrada y que no puede esperar hasta el siguiente da hbil, puede llamar/localizar a su doctor(a) al nmero que aparece a continuacin.   Por favor, tenga en cuenta que aunque hacemos todo lo posible para estar disponibles para asuntos urgentes fuera del horario de oficina, no estamos disponibles las 24 horas del da, los 7 das de la semana.   Si tiene un problema urgente y no puede comunicarse con nosotros, puede  optar por buscar atencin mdica  en el consultorio de su doctor(a), en una clnica privada, en un centro de atencin urgente o en una sala de emergencias.  Si tiene una emergencia mdica, por favor llame inmediatamente al 911 o vaya a la sala de emergencias.  Nmeros de bper  - Dr. Kowalski: [phone]  - Dra. Moye: [phone]  - Dra. Stewart: [phone]  En caso de inclemencias del tiempo, por favor llame a nuestra lnea principal al [phone] para una actualizacin sobre el estado de cualquier retraso o cierre.  Consejos para la medicacin en dermatologa: Por favor, guarde las cajas en las que vienen los medicamentos de uso tpico para ayudarle a seguir las instrucciones sobre dnde y cmo usarlos. Las farmacias generalmente imprimen las instrucciones del medicamento slo en las cajas y no directamente en los tubos del medicamento.   Si su medicamento es muy caro, por favor, pngase en contacto con   nuestra oficina llamando al [phone] y presione la opcin 4 o envenos un mensaje a travs de MyChart.   No podemos decirle cul ser su copago por los medicamentos por adelantado ya que esto es diferente dependiendo de la cobertura de su seguro. Sin embargo, es posible que podamos encontrar un medicamento sustituto a menor costo o llenar un formulario para que el seguro cubra el medicamento que se considera necesario.   Si se requiere una autorizacin previa para que su compaa de seguros cubra su medicamento, por favor permtanos de 1 a 2 das hbiles para completar este proceso.  Los precios de los medicamentos varan con frecuencia dependiendo del lugar de dnde se surte la receta y alguna farmacias pueden ofrecer precios ms baratos.  El sitio web www.goodrx.com tiene cupones para medicamentos de diferentes farmacias. Los precios aqu no tienen en cuenta lo que podra costar con la ayuda del seguro (puede ser ms barato con su seguro), pero el sitio web puede darle el  precio si no utiliz ningn seguro.  - Puede imprimir el cupn correspondiente y llevarlo con su receta a la farmacia.  - Tambin puede pasar por nuestra oficina durante el horario de atencin regular y recoger una tarjeta de cupones de GoodRx.  - Si necesita que su receta se enve electrnicamente a una farmacia diferente, informe a nuestra oficina a travs de MyChart de [facility] o por telfono llamando al [phone] y presione la opcin 4.  

## 2022-01-22 NOTE — Progress Notes (Signed)
Follow-Up Visit   Subjective  [name] is a 88 y.o. female who presents for the following: Follow-up.  Patient presents for 3 month follow-up. 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 She also has a history of SCC of the left cheek, treated with radiation (25 days total). Spot by eye is coming back, SCC on L cheek is clear.  She has persistent scaly patch on L medial cheek  Patient accompanied by daughter.   The following portions of the chart were reviewed this encounter and updated as appropriate:       Review of Systems:  No other skin or systemic complaints except as noted in HPI or Assessment and Plan.  Objective  Well appearing patient in no apparent distress; mood and affect are within normal limits.  A focused examination was performed including face, arms. Relevant physical exam findings are noted in the Assessment and Plan.  L cheek Well-healed SCC site  Left Lateral Eye 0.6 CM pink firm papule     Left Medial Cheek Pink scaly patch 3.0 x 2.0 CM     Right Ulnar Dorsal Hand 2.0 CM pink scaly patch       Assessment & Plan  Actinic Damage - chronic, secondary to cumulative UV radiation exposure/sun exposure over time - diffuse scaly erythematous macules with underlying dyspigmentation - Recommend daily broad spectrum sunscreen SPF 30+ to sun-exposed areas, reapply every 2 hours as needed.  - Recommend staying in the shade or wearing long sleeves, sun glasses (UVA+UVB protection) and wide brim hats (4-inch brim around the entire circumference of the hat). - Call for new or changing lesions.  History of SCC (squamous cell carcinoma) of skin L cheek  Clear s/p radiation treatments. Observe for recurrence. Call clinic for new or changing lesions.  Recommend regular skin exams, daily broad-spectrum spf 30+ sunscreen use, and photoprotection.    Neoplasm of uncertain behavior of skin (3) Left Lateral  Eye  Skin / nail biopsy Type of biopsy: tangential   Informed consent: discussed and consent obtained   Patient was prepped and draped in usual sterile fashion: Area prepped with alcohol. Anesthesia: the lesion was anesthetized in a standard fashion   Anesthetic:  1% lidocaine w/ epinephrine 1-100,000 buffered w/ 8.4% NaHCO3 Instrument used: flexible razor blade   Hemostasis achieved with: pressure, aluminum chloride and electrodesiccation   Outcome: patient tolerated procedure well    Destruction of lesion  Destruction method: electrodesiccation and curettage   Informed consent: discussed and consent obtained   Timeout:  patient name, date of birth, surgical site, and procedure verified Anesthesia: the lesion was anesthetized in a standard fashion   Anesthetic:  1% lidocaine w/ epinephrine 1-100,000 local infiltration Curettage performed in three different directions: Yes   Electrodesiccation performed over the curetted area: Yes   Final wound size (cm):  0.7 Hemostasis achieved with:  pressure, aluminum chloride and electrodesiccation Outcome: patient tolerated procedure well with no complications   Post-procedure details: wound care instructions given   Post-procedure details comment:  Ointment and bandage applied.  Specimen 1 - Surgical pathology Differential Diagnosis: r/o recurrent SCC Check Margins: No EDC today  Left Medial Cheek  Skin / nail biopsy Type of biopsy: tangential   Informed consent: discussed and consent obtained   Patient was prepped and draped in usual sterile fashion: Area prepped with alcohol. Anesthesia: the lesion was anesthetized in a standard fashion   Anesthetic:  1% lidocaine  w/ epinephrine 1-100,000 buffered w/ 8.4% NaHCO3 Instrument used: flexible razor blade   Hemostasis achieved with: pressure, aluminum chloride and electrodesiccation   Outcome: patient tolerated procedure well   Post-procedure details: wound care instructions given    Post-procedure details comment:  Ointment and small bandage applied  Specimen 2 - Surgical pathology Differential Diagnosis: ISK r/o BCC/SCC  Check Margins: No  Right Ulnar Dorsal Hand  If left medial cheek positive for skin cancer, recommend radiation. Pt not able to tolerate extended procedures due to dementia.  Had difficult time with biopsies today.  Will biopsy R ulnar hand on follow-up.  Possible recurrent SCC L lat eye, bx/edc today   Return in about 1 month (around 02/22/2022) for f/u bxs, bx hand.  IJamesetta Orleans, CMA, am acting as scribe for Brendolyn Patty, MD .  Documentation: I have reviewed the above documentation for accuracy and completeness, and I agree with the above.  Brendolyn Patty MD

## 2022-01-23 ENCOUNTER — Ambulatory Visit (INDEPENDENT_AMBULATORY_CARE_PROVIDER_SITE_OTHER): Payer: Medicare Other | Admitting: Physician Assistant

## 2022-01-23 DIAGNOSIS — F331 Major depressive disorder, recurrent, moderate: Secondary | ICD-10-CM | POA: Diagnosis not present

## 2022-01-23 DIAGNOSIS — S81811D Laceration without foreign body, right lower leg, subsequent encounter: Secondary | ICD-10-CM

## 2022-01-23 DIAGNOSIS — G301 Alzheimer's disease with late onset: Secondary | ICD-10-CM | POA: Diagnosis not present

## 2022-01-23 DIAGNOSIS — F02B0 Dementia in other diseases classified elsewhere, moderate, without behavioral disturbance, psychotic disturbance, mood disturbance, and anxiety: Secondary | ICD-10-CM | POA: Diagnosis not present

## 2022-01-23 NOTE — Progress Notes (Signed)
   Referring Provider Glean Hess, MD [address] [place],  [place] [address]   CC:  Chief Complaint  Patient presents with   Follow-up      Tytiana Coles is an 88 y.o. female.   HPI: This is a 88 year old female seen in our office for follow-up evaluation status post skin tear to right lower leg.  She was most recently seen in the office on 01/09/2022.  In brief her wound was initially managed with endoform sheets, she was switched over to [facility].  She progressed very well and had significant improvements over the last few visits.  She is accompanied by her daughter who has come to all of her visits, she denies any issues or concerns, she denies any infectious signs or symptoms.  She notes that the nursing home had some clear dressing over the wound when she arrived today but was uncertain what it was.  Review of Systems General: Negative for fever  Physical Exam    12/17/2021    2:49 PM 12/17/2021    2:36 PM 11/19/2021    2:29 PM  Vitals with BMI  Height '5\' 0"'$  '5\' 0"'$    Weight 118 lbs 13 oz    BMI 94.7    Systolic 654  650  Diastolic 65  72  Pulse 85  78    General:  No acute distress,  Alert and oriented, Non-Toxic, Normal speech and affect.  Right lower extremity with healed wound to the anterior aspect, she does have some scarring from the wound.  No surrounding redness or tenderness to palpation.  Thin dermis throughout the lower extremity.    Assessment/Plan  This is a 88 year old female seen in our office for follow-up evaluation of right lower extremity wound.  The patient has done very well throughout the course and at this point has healed over completely.  She has a very thin dermis and somewhat friable skin.  Given her overall improvement I do feel it is reasonable to have her follow-up 1 last time with this.  I have recommended that she continue an Adaptic dressing over that area of the shin followed by gauze and a Coban dressing as this will likely  provide some continued protection to the area.  We will have the patient follow-up in our office in 3 weeks for repeat evaluation.  She may return sooner or reach out to [place] if she has any further questions or concerns.  Both patient and her daughter verbalized understanding and agreement to our plan had no further questions or concerns at today's visit.  Stevie Kern Keyandre Pileggi 01/23/2022, 7:51 PM

## 2022-01-24 DIAGNOSIS — F331 Major depressive disorder, recurrent, moderate: Secondary | ICD-10-CM | POA: Diagnosis not present

## 2022-01-24 DIAGNOSIS — S81811D Laceration without foreign body, right lower leg, subsequent encounter: Secondary | ICD-10-CM | POA: Diagnosis not present

## 2022-01-24 DIAGNOSIS — F02B0 Dementia in other diseases classified elsewhere, moderate, without behavioral disturbance, psychotic disturbance, mood disturbance, and anxiety: Secondary | ICD-10-CM | POA: Diagnosis not present

## 2022-01-24 DIAGNOSIS — R269 Unspecified abnormalities of gait and mobility: Secondary | ICD-10-CM | POA: Diagnosis not present

## 2022-01-24 DIAGNOSIS — N3000 Acute cystitis without hematuria: Secondary | ICD-10-CM | POA: Diagnosis not present

## 2022-01-24 DIAGNOSIS — G301 Alzheimer's disease with late onset: Secondary | ICD-10-CM | POA: Diagnosis not present

## 2022-01-24 DIAGNOSIS — N1832 Chronic kidney disease, stage 3b: Secondary | ICD-10-CM | POA: Diagnosis not present

## 2022-01-25 DIAGNOSIS — N1832 Chronic kidney disease, stage 3b: Secondary | ICD-10-CM | POA: Diagnosis not present

## 2022-01-25 DIAGNOSIS — G301 Alzheimer's disease with late onset: Secondary | ICD-10-CM | POA: Diagnosis not present

## 2022-01-25 DIAGNOSIS — R296 Repeated falls: Secondary | ICD-10-CM | POA: Diagnosis not present

## 2022-01-25 DIAGNOSIS — R278 Other lack of coordination: Secondary | ICD-10-CM | POA: Diagnosis not present

## 2022-01-25 DIAGNOSIS — M6281 Muscle weakness (generalized): Secondary | ICD-10-CM | POA: Diagnosis not present

## 2022-01-25 DIAGNOSIS — R2681 Unsteadiness on feet: Secondary | ICD-10-CM | POA: Diagnosis not present

## 2022-01-28 ENCOUNTER — Other Ambulatory Visit: Payer: Self-pay

## 2022-01-28 ENCOUNTER — Telehealth: Payer: Self-pay

## 2022-01-28 DIAGNOSIS — R2681 Unsteadiness on feet: Secondary | ICD-10-CM | POA: Diagnosis not present

## 2022-01-28 DIAGNOSIS — N1832 Chronic kidney disease, stage 3b: Secondary | ICD-10-CM | POA: Diagnosis not present

## 2022-01-28 DIAGNOSIS — G301 Alzheimer's disease with late onset: Secondary | ICD-10-CM | POA: Diagnosis not present

## 2022-01-28 DIAGNOSIS — M6281 Muscle weakness (generalized): Secondary | ICD-10-CM | POA: Diagnosis not present

## 2022-01-28 DIAGNOSIS — R296 Repeated falls: Secondary | ICD-10-CM | POA: Diagnosis not present

## 2022-01-28 DIAGNOSIS — R278 Other lack of coordination: Secondary | ICD-10-CM | POA: Diagnosis not present

## 2022-01-28 MED ORDER — FLUOROURACIL 5 % EX CREA: TOPICAL_CREAM | Freq: Two times a day (BID) | CUTANEOUS | 1 refills | Status: DC

## 2022-01-28 NOTE — Telephone Encounter (Signed)
Advised patient's daughter, [name], biopsy of the left medial cheek was SCC in situ. Recommend topical chemotherapy, 5FU/Calcipotriene Cream BID x 2 weeks. Also left lateral eye was SCC and was treated with EDC at time of biopsy. Since this was recurrent, patient could treat with 5FU/Vit D BID x 2 weeks. Will send Rx to [facility]. Directions and side effects of medicines emailed to daughter and caregiver.

## 2022-01-28 NOTE — Telephone Encounter (Signed)
-----   Message from Brendolyn Patty, MD sent at 01/28/2022  8:49 AM EDT ----- 1. Skin , left lateral eye MODERATELY DIFFERENTIATED SQUAMOUS CELL CARCINOMA 2. Skin , left medial cheek SQUAMOUS CELL CARCINOMA IN SITU, BASE INVOLVED  1. SCC skin cancer- already treated with EDC at time of biopsy, since this was recurrent SCC, could apply 5FU/VitD cream as below once healed to try to reduce recurrence risk, but need to avoid getting in eye. 2. SCCIS- recommend topical chemotherapy.  5FU/Vit.D cream twice daily for 2 weeks to entire red scaly area on L cheek, avoid getting in eye.  Can send compound to [facility] in [place] or can send to [facility] (will come in mail).  Neither covered by insurance, but are around $50.    5-fluorouracil/calcipotriene cream is is a type of field treatment used to treat precancers, thin skin cancers, and areas of sun damage. Expected reaction includes irritation and mild inflammation potentially progressing to more severe inflammation including redness, scaling, crusting and open sores/erosions.  If too much irritation occurs, ensure application of only a thin layer and decrease frequency of use to achieve a tolerable level of inflammation. Recommend applying Vaseline ointment to open sores as needed.  Minimize sun exposure while under treatment. Recommend daily broad spectrum sunscreen SPF 30+ to sun-exposed areas, reapply every 2 hours as needed.    - please call patient

## 2022-01-29 ENCOUNTER — Inpatient Hospital Stay: Payer: Medicare Other | Attending: Oncology | Admitting: Oncology

## 2022-01-29 ENCOUNTER — Other Ambulatory Visit (HOSPITAL_COMMUNITY): Payer: Self-pay

## 2022-01-29 ENCOUNTER — Encounter: Payer: Self-pay | Admitting: Oncology

## 2022-01-29 ENCOUNTER — Inpatient Hospital Stay: Payer: Medicare Other

## 2022-01-29 DIAGNOSIS — D45 Polycythemia vera: Secondary | ICD-10-CM

## 2022-01-29 DIAGNOSIS — Z881 Allergy status to other antibiotic agents status: Secondary | ICD-10-CM | POA: Insufficient documentation

## 2022-01-29 DIAGNOSIS — R2681 Unsteadiness on feet: Secondary | ICD-10-CM | POA: Diagnosis not present

## 2022-01-29 DIAGNOSIS — D631 Anemia in chronic kidney disease: Secondary | ICD-10-CM

## 2022-01-29 DIAGNOSIS — Z8249 Family history of ischemic heart disease and other diseases of the circulatory system: Secondary | ICD-10-CM | POA: Diagnosis not present

## 2022-01-29 DIAGNOSIS — Z803 Family history of malignant neoplasm of breast: Secondary | ICD-10-CM | POA: Insufficient documentation

## 2022-01-29 DIAGNOSIS — Z87440 Personal history of urinary (tract) infections: Secondary | ICD-10-CM | POA: Insufficient documentation

## 2022-01-29 DIAGNOSIS — Z818 Family history of other mental and behavioral disorders: Secondary | ICD-10-CM | POA: Diagnosis not present

## 2022-01-29 DIAGNOSIS — R296 Repeated falls: Secondary | ICD-10-CM | POA: Diagnosis not present

## 2022-01-29 DIAGNOSIS — Z7982 Long term (current) use of aspirin: Secondary | ICD-10-CM | POA: Diagnosis not present

## 2022-01-29 DIAGNOSIS — C44329 Squamous cell carcinoma of skin of other parts of face: Secondary | ICD-10-CM | POA: Diagnosis not present

## 2022-01-29 DIAGNOSIS — R531 Weakness: Secondary | ICD-10-CM | POA: Diagnosis not present

## 2022-01-29 DIAGNOSIS — F0390 Unspecified dementia without behavioral disturbance: Secondary | ICD-10-CM | POA: Insufficient documentation

## 2022-01-29 DIAGNOSIS — D7581 Myelofibrosis: Secondary | ICD-10-CM | POA: Diagnosis not present

## 2022-01-29 DIAGNOSIS — Z85828 Personal history of other malignant neoplasm of skin: Secondary | ICD-10-CM | POA: Diagnosis not present

## 2022-01-29 DIAGNOSIS — N1832 Chronic kidney disease, stage 3b: Secondary | ICD-10-CM | POA: Diagnosis not present

## 2022-01-29 DIAGNOSIS — Z79899 Other long term (current) drug therapy: Secondary | ICD-10-CM | POA: Diagnosis not present

## 2022-01-29 DIAGNOSIS — M6281 Muscle weakness (generalized): Secondary | ICD-10-CM | POA: Diagnosis not present

## 2022-01-29 DIAGNOSIS — Z923 Personal history of irradiation: Secondary | ICD-10-CM | POA: Insufficient documentation

## 2022-01-29 DIAGNOSIS — N189 Chronic kidney disease, unspecified: Secondary | ICD-10-CM | POA: Insufficient documentation

## 2022-01-29 DIAGNOSIS — G301 Alzheimer's disease with late onset: Secondary | ICD-10-CM | POA: Diagnosis not present

## 2022-01-29 DIAGNOSIS — R278 Other lack of coordination: Secondary | ICD-10-CM | POA: Diagnosis not present

## 2022-01-29 DIAGNOSIS — D471 Chronic myeloproliferative disease: Secondary | ICD-10-CM | POA: Diagnosis not present

## 2022-01-29 LAB — CBC WITH DIFFERENTIAL/PLATELET
Abs Immature Granulocytes: 0.59 10*3/uL — ABNORMAL HIGH (ref 0.00–0.07)
Basophils Absolute: 0.2 10*3/uL — ABNORMAL HIGH (ref 0.0–0.1)
Basophils Relative: 2 %
Eosinophils Absolute: 0.4 10*3/uL (ref 0.0–0.5)
Eosinophils Relative: 5 %
HCT: 33.8 % — ABNORMAL LOW (ref 36.0–46.0)
Hemoglobin: 10.5 g/dL — ABNORMAL LOW (ref 12.0–15.0)
Immature Granulocytes: 7 %
Lymphocytes Relative: 19 %
Lymphs Abs: 1.7 10*3/uL (ref 0.7–4.0)
MCH: 28.4 pg (ref 26.0–34.0)
MCHC: 31.1 g/dL (ref 30.0–36.0)
MCV: 91.4 fL (ref 80.0–100.0)
Monocytes Absolute: 0.9 10*3/uL (ref 0.1–1.0)
Monocytes Relative: 10 %
Neutro Abs: 5.2 10*3/uL (ref 1.7–7.7)
Neutrophils Relative %: 57 %
Platelets: 574 10*3/uL — ABNORMAL HIGH (ref 150–400)
RBC: 3.7 MIL/uL — ABNORMAL LOW (ref 3.87–5.11)
RDW: 23.6 % — ABNORMAL HIGH (ref 11.5–15.5)
WBC: 9 10*3/uL (ref 4.0–10.5)
nRBC: 0.2 % (ref 0.0–0.2)

## 2022-01-29 LAB — COMPREHENSIVE METABOLIC PANEL
ALT: 15 U/L (ref 0–44)
AST: 21 U/L (ref 15–41)
Albumin: 4.2 g/dL (ref 3.5–5.0)
Alkaline Phosphatase: 79 U/L (ref 38–126)
Anion gap: 8 (ref 5–15)
BUN: 36 mg/dL — ABNORMAL HIGH (ref 8–23)
CO2: 26 mmol/L (ref 22–32)
Calcium: 9.1 mg/dL (ref 8.9–10.3)
Chloride: 101 mmol/L (ref 98–111)
Creatinine, Ser: 1.46 mg/dL — ABNORMAL HIGH (ref 0.44–1.00)
GFR, Estimated: 33 mL/min — ABNORMAL LOW (ref >60)
Glucose, Bld: 176 mg/dL — ABNORMAL HIGH (ref 70–99)
Potassium: 4.6 mmol/L (ref 3.5–5.1)
Sodium: 135 mmol/L (ref 135–145)
Total Bilirubin: 0.4 mg/dL (ref 0.3–1.2)
Total Protein: 7.1 g/dL (ref 6.5–8.1)

## 2022-01-29 MED ORDER — RUXOLITINIB PHOSPHATE 5 MG PO TABS
ORAL_TABLET | ORAL | 2 refills | Status: DC
  Filled 2022-01-29: qty 90, fill #0
  Filled 2022-02-13: qty 90, 30d supply, fill #0
  Filled 2022-03-11: qty 90, 30d supply, fill #1
  Filled 2022-03-28: qty 90, 30d supply, fill #2

## 2022-01-29 NOTE — Assessment & Plan Note (Signed)
Anemia due to CKD, and myelofibrosis.   Hemoglobin has improved.  No need for Retacrit.

## 2022-01-29 NOTE — Progress Notes (Signed)
Hematology/Oncology Progress note [phone] [phone]      ASSESSMENT & PLAN:   Myelofibrosis ([place]) Polycythemia vera and  secondary myelofibrosis. Clinically patient is stable. Continue current regimen with Jakafi 10 mg in the morning, 5 mg in the evening.   Continue aspirin 81 mg daily.  Squamous cell cancer of skin of left cheek S/p palliative RT. Follow up with dermatology  Anemia in chronic kidney disease Anemia due to CKD, and myelofibrosis.   Hemoglobin has improved.  No need for Retacrit.  Orders Placed This Encounter  Procedures   CBC with Differential/Platelet    Standing Status:   Future    Standing Expiration Date:   01/30/2023   Comprehensive metabolic panel    Standing Status:   Future    Standing Expiration Date:   01/30/2023   Follow up in 8 weeks.  All questions were answered. The patient knows to call the clinic with any problems, questions or concerns.  Earlie Server, MD, PhD [facility] Hematology Oncology 01/29/2022      Chief Complaint: Laura Wilcox is a 88 y.o. female with polycythemia vera (PV) and secondary myelofibrosis presents for follow up   Laura Wilcox is a 88 y.o.afemale who has above oncology history reviewed by me today presented for follow up visit for management of  polycythemia vera  Patient previously followed up by Dr.Corcoran, patient switched care to me on 11/16/20 Extensive medical record review was performed by me  Feb 2014 polycythemia vera and secondary myelofibrosis.  She presented in 04/2002 with thrombocytosis (835,000).  JAK2 V617F was positive   Bone marrow was on 04/15/2002 revealed a normocellular marrow for age with trilineage hematopoiesis and mild megakaryocytic hyperplasia with focal atypia.  There was no morphologic evidence of a myeloproliferative disorder.  Iron stains were inadequate for evaluation of storage iron.  There was no increase in reticulin  fibers.  She was started on Agrylin in 2012  Bone marrow on 09/25/2012 revealed persistent myeloproliferative disorder s/p Agrylin.  The current features were best classified as persistent myeloproliferative neoplasm (MPN) JAK2 V617F mutation positive.  The current features of markedly increased atypical megakaryocytes paresis, with frequent clustering with frequent hyper/deeply lobulated forms, bizarre forms and forms with staghorn nuclei, hyperchromatic forms and abundant cytoplasm was highly suggestive of a myeloproliferative neoplasm.  Marrow was hypercellular for age (30-50%) with increased trilineage hematopoiesis and markedly increased atypical megakaryopoiesis with frequent clustering.  There were no increased blasts.  Iron stores were absent.  There was moderate to severe myelofibrosis (grade MF 2-3).  Differential includes ET, PV, primary myelofibrosis, and MPN, unclassifiable.  Cytogenetics were normal (35, XX). She developed pancytopenia in 12/2015.  Etiology is unclear (medication confusion or hydroxyurea + Agrylin).   She received Procrit 40,000 units (intermittently 09/2012 - 06/2016), Venofer (09/2012 - 10/2012), and Infed (12/2015 - 03/2016).  She received Neupogen during a period of neutropenia.   She was anagrelide from 2004 - 08/13/2017.  She began [place] on 08/14/2017.  Jakafi was increased from 5 mg BID to 10 mg BID on 06/17/2018.  Jakafi was increased to 15 mg BID on 08/03/2018.  She has been back on Jakafi 5 mg BID since 02/01/2019 then 10 mg BID on 05/25/2019.  [name] was on hold from 12/20/2019 - 01/12/2020.  Jakafi 5 mg twice daily restarted on 01/12/2020 then increased to 10 mg in the morning and 5 mg in the evening on 02/02/2020. [name] is currently 20 mg po BID.  She has received  Retacrit to maintain a hemoglobin > 10 (12/24/2019 and 03   Squamous cell carcinoma left cheek- s/p cryotherapy and debridement. Managed by Dr. Nehemiah Massed  10/22/2020- 10/26/2020 Hospitalization due to  acute metabolic encephalopathy, UTI,E. coli bacteremia AKI on CKD, acute on chronic anemia. [name] was held during the admission. She was discharged and spent a short period of time in rehab. [name] was held.   11/06/2020 seen by symptom management for weakness.  11/16/2020 established care with me. Resumed Jakafi at '10mg'$  BID  #Dementia/cognitive impairment- patient has hx of dementia. Per daughter, it has been difficult to convince patent to see neurology for further evaluation. She is not on any medication for her memory loss.  She has been referred to home health.  06/25/2021 - 07/02/2021,Patient was hospitalized due to UTI, generalized weakness Jakafi was.  Held. 07/25/2021, resumed on Jakafi 5 mg daily.  07/25/2021, bilateral lower extremity ultrasound were negative for DVT. 08/20/2021, increased Jakafi to 5 mg twice daily. INTERVAL HISTORY Laura Wilcox is a 88 y.o. female who has above history reviewed by me today presents for follow up visit for management of polycythemia vera and secondary myelofibrosis.   Patient was accompanied by daughter Laura Wilcox  history of squamous cell carcinoma on her left mid cheek s/p radiation, she follows up with dermatology, s/p another recent facial biopsy.  Patient is a poor historian.  She currently lives in a facility. No new complaints.   Past Medical History:  Diagnosis Date   Acute metabolic encephalopathy 93/71/6967   Confusion 12/30/2018   Polycythemia    SCC (squamous cell carcinoma) 04/11/2021   R med dorsum hand - ED&C SCCIS   SCC (squamous cell carcinoma) 10/09/2021   left lateral eye, EDC   SCC (squamous cell carcinoma) 01/28/2022   left lateral eye, recurrent, EDC. 5FU/Calcipotriene cream.   SCC (squamous cell carcinoma) 01/28/2022   in situ, left medial cheek, 5FU/Calcipotriene Cr   Squamous cell carcinoma of skin 09/06/2020   R thumb - ED&C   Urinary tract infection without hematuria 01/02/2020   Urticaria 06/02/2017    Past  Surgical History:  Procedure Laterality Date   ABDOMINAL HYSTERECTOMY     complete   BREAST SURGERY      Family History  Problem Relation Age of Onset   Heart disease Father    Cancer Sister        breast   Cancer Sister        breast   Alzheimer's disease Sister     Social History:  reports that she has never smoked. She has never used smokeless tobacco. She reports that she does not drink alcohol and does not use drugs.   Allergies:  Allergies  Allergen Reactions   Bactrim [Sulfamethoxazole-Trimethoprim]     Due to kidney disease    Epinephrine Other (See Comments)    Current Medications: Current Outpatient Medications  Medication Sig Dispense Refill   acetaminophen (TYLENOL) 325 MG tablet Take 325-650 mg by mouth every 6 (six) hours as needed for mild pain or moderate pain.     aspirin EC 81 MG tablet Take 81 mg by mouth daily. Swallow whole.     calcitRIOL (ROCALTROL) 0.25 MCG capsule Take 0.25 mcg by mouth every Monday, Wednesday, and Friday.     citalopram (CELEXA) 10 MG tablet Take 1 tablet (10 mg total) by mouth daily. 30 tablet 2   feeding supplement (ENSURE ENLIVE / ENSURE PLUS) LIQD Take 237 mLs by mouth 2 (two) times daily between meals. [place]  mL 12   polyethylene glycol (MIRALAX / GLYCOLAX) 17 g packet Take 17 g by mouth daily as needed for mild constipation. 14 each 0   VELTASSA 16.8 g PACK Take by mouth.     vitamin B-12 (CYANOCOBALAMIN) 1000 MCG tablet TAKE 1 TABLET BY MOUTH DAILY 30 tablet 1   fluorouracil (EFUDEX) 5 % cream Apply topically 2 (two) times daily. Apply to entire red, scaly area on left cheek twice daily for 2 weeks. (Patient not taking: Reported on 01/29/2022) 30 g 1   mupirocin ointment (BACTROBAN) 2 % Apply to wound every other day (Patient not taking: Reported on 01/29/2022) 22 g 1   ruxolitinib phosphate (JAKAFI) 5 MG tablet Take 2 tablets ('10mg'$ ) in morning and take 1 tablet ('5mg'$ ) at night. 90 tablet 2   No current facility-administered  medications for this visit.    Review of Systems  Unable to perform ROS: Dementia    Performance status (ECOG):  2  Vitals Blood pressure (!) 117/98, pulse 85, temperature (!) 95.9 F (35.5 C), temperature source Tympanic, weight 122 lb 14.4 oz (55.7 kg), SpO2 97 %.   Physical Exam Vitals reviewed.  Constitutional:      General: She is not in acute distress.    Appearance: She is well-developed.     Comments: Frail appearance, patient sits in the wheelchair  HENT:     Head: Normocephalic.     Nose: Nose normal. No congestion.     Mouth/Throat:     Pharynx: No oropharyngeal exudate.  Eyes:     General: No scleral icterus.    Conjunctiva/sclera: Conjunctivae normal.  Cardiovascular:     Rate and Rhythm: Normal rate.     Pulses: Normal pulses.  Pulmonary:     Effort: Pulmonary effort is normal. No respiratory distress.  Abdominal:     General: There is no distension.     Tenderness: There is no abdominal tenderness.  Musculoskeletal:        General: No deformity. Normal range of motion.  Skin:    General: Skin is warm and dry.     Coloration: Skin is not pale.     Comments: Left cheek skin lesion  Neurological:     Mental Status: She is alert. Mental status is at baseline.  Psychiatric:        Attention and Perception: Attention normal.        Mood and Affect: Mood and affect normal.        Behavior: Behavior is cooperative.        Cognition and Memory: Cognition is impaired. Memory is impaired.    Labs    Latest Ref Rng & Units 01/29/2022    1:35 PM 12/17/2021    2:35 PM 11/19/2021    3:09 PM  CBC  WBC 4.0 - 10.5 K/uL 9.0  11.4  10.2   Hemoglobin 12.0 - 15.0 g/dL 10.5  10.8  10.7   Hematocrit 36.0 - 46.0 % 33.8  33.9  34.4   Platelets 150 - 400 K/uL 574  644  660       Latest Ref Rng & Units 01/29/2022    1:35 PM 12/17/2021    2:35 PM 10/22/2021    1:10 PM  CMP  Glucose 70 - 99 mg/dL 176  126  190   BUN 8 - 23 mg/dL 36  30  31   Creatinine 0.44 - 1.00  mg/dL 1.46  1.36  1.43   Sodium 135 - 145 mmol/L  135  138  135   Potassium 3.5 - 5.1 mmol/L 4.6  3.8  4.2   Chloride 98 - 111 mmol/L 101  104  106   CO2 22 - 32 mmol/L '26  27  25   '$ Calcium 8.9 - 10.3 mg/dL 9.1  8.9  8.5   Total Protein 6.5 - 8.1 g/dL 7.1  7.1  7.0   Total Bilirubin 0.3 - 1.2 mg/dL 0.4  0.3  0.4   Alkaline Phos 38 - 126 U/L 79  95  98   AST 15 - 41 U/L '21  17  18   '$ ALT 0 - 44 U/L 15  12  11

## 2022-01-29 NOTE — Assessment & Plan Note (Addendum)
S/p palliative RT. Follow up with dermatology

## 2022-01-29 NOTE — Assessment & Plan Note (Signed)
Polycythemia vera and  secondary myelofibrosis. Clinically patient is stable. Continue current regimen with Jakafi 10 mg in the morning, 5 mg in the evening.   Continue aspirin 81 mg daily.

## 2022-01-30 ENCOUNTER — Other Ambulatory Visit: Payer: Self-pay | Admitting: *Deleted

## 2022-01-30 DIAGNOSIS — N1832 Chronic kidney disease, stage 3b: Secondary | ICD-10-CM | POA: Diagnosis not present

## 2022-01-30 DIAGNOSIS — R278 Other lack of coordination: Secondary | ICD-10-CM | POA: Diagnosis not present

## 2022-01-30 DIAGNOSIS — R296 Repeated falls: Secondary | ICD-10-CM | POA: Diagnosis not present

## 2022-01-30 DIAGNOSIS — M6281 Muscle weakness (generalized): Secondary | ICD-10-CM | POA: Diagnosis not present

## 2022-01-30 DIAGNOSIS — G301 Alzheimer's disease with late onset: Secondary | ICD-10-CM | POA: Diagnosis not present

## 2022-01-30 DIAGNOSIS — R2681 Unsteadiness on feet: Secondary | ICD-10-CM | POA: Diagnosis not present

## 2022-01-30 MED ORDER — CITALOPRAM HYDROBROMIDE 10 MG PO TABS: 10.0000 mg | ORAL_TABLET | Freq: Every day | ORAL | 2 refills | Status: DC

## 2022-01-30 NOTE — Telephone Encounter (Signed)
Refill request from [facility] for celexa

## 2022-01-31 DIAGNOSIS — R278 Other lack of coordination: Secondary | ICD-10-CM | POA: Diagnosis not present

## 2022-01-31 DIAGNOSIS — N1832 Chronic kidney disease, stage 3b: Secondary | ICD-10-CM | POA: Diagnosis not present

## 2022-01-31 DIAGNOSIS — M6281 Muscle weakness (generalized): Secondary | ICD-10-CM | POA: Diagnosis not present

## 2022-01-31 DIAGNOSIS — G301 Alzheimer's disease with late onset: Secondary | ICD-10-CM | POA: Diagnosis not present

## 2022-01-31 DIAGNOSIS — R296 Repeated falls: Secondary | ICD-10-CM | POA: Diagnosis not present

## 2022-01-31 DIAGNOSIS — R2681 Unsteadiness on feet: Secondary | ICD-10-CM | POA: Diagnosis not present

## 2022-02-04 DIAGNOSIS — R2681 Unsteadiness on feet: Secondary | ICD-10-CM | POA: Diagnosis not present

## 2022-02-04 DIAGNOSIS — N1832 Chronic kidney disease, stage 3b: Secondary | ICD-10-CM | POA: Diagnosis not present

## 2022-02-04 DIAGNOSIS — R296 Repeated falls: Secondary | ICD-10-CM | POA: Diagnosis not present

## 2022-02-04 DIAGNOSIS — G301 Alzheimer's disease with late onset: Secondary | ICD-10-CM | POA: Diagnosis not present

## 2022-02-04 DIAGNOSIS — R278 Other lack of coordination: Secondary | ICD-10-CM | POA: Diagnosis not present

## 2022-02-04 DIAGNOSIS — M6281 Muscle weakness (generalized): Secondary | ICD-10-CM | POA: Diagnosis not present

## 2022-02-05 DIAGNOSIS — R2681 Unsteadiness on feet: Secondary | ICD-10-CM | POA: Diagnosis not present

## 2022-02-05 DIAGNOSIS — M6281 Muscle weakness (generalized): Secondary | ICD-10-CM | POA: Diagnosis not present

## 2022-02-05 DIAGNOSIS — N1832 Chronic kidney disease, stage 3b: Secondary | ICD-10-CM | POA: Diagnosis not present

## 2022-02-05 DIAGNOSIS — R296 Repeated falls: Secondary | ICD-10-CM | POA: Diagnosis not present

## 2022-02-05 DIAGNOSIS — R278 Other lack of coordination: Secondary | ICD-10-CM | POA: Diagnosis not present

## 2022-02-05 DIAGNOSIS — D631 Anemia in chronic kidney disease: Secondary | ICD-10-CM | POA: Diagnosis not present

## 2022-02-05 DIAGNOSIS — G301 Alzheimer's disease with late onset: Secondary | ICD-10-CM | POA: Diagnosis not present

## 2022-02-05 DIAGNOSIS — N2581 Secondary hyperparathyroidism of renal origin: Secondary | ICD-10-CM | POA: Diagnosis not present

## 2022-02-06 DIAGNOSIS — R278 Other lack of coordination: Secondary | ICD-10-CM | POA: Diagnosis not present

## 2022-02-06 DIAGNOSIS — M6281 Muscle weakness (generalized): Secondary | ICD-10-CM | POA: Diagnosis not present

## 2022-02-06 DIAGNOSIS — N1832 Chronic kidney disease, stage 3b: Secondary | ICD-10-CM | POA: Diagnosis not present

## 2022-02-06 DIAGNOSIS — G301 Alzheimer's disease with late onset: Secondary | ICD-10-CM | POA: Diagnosis not present

## 2022-02-06 DIAGNOSIS — R2681 Unsteadiness on feet: Secondary | ICD-10-CM | POA: Diagnosis not present

## 2022-02-06 DIAGNOSIS — R296 Repeated falls: Secondary | ICD-10-CM | POA: Diagnosis not present

## 2022-02-07 DIAGNOSIS — M6281 Muscle weakness (generalized): Secondary | ICD-10-CM | POA: Diagnosis not present

## 2022-02-07 DIAGNOSIS — R2681 Unsteadiness on feet: Secondary | ICD-10-CM | POA: Diagnosis not present

## 2022-02-07 DIAGNOSIS — R278 Other lack of coordination: Secondary | ICD-10-CM | POA: Diagnosis not present

## 2022-02-07 DIAGNOSIS — R296 Repeated falls: Secondary | ICD-10-CM | POA: Diagnosis not present

## 2022-02-07 DIAGNOSIS — N1832 Chronic kidney disease, stage 3b: Secondary | ICD-10-CM | POA: Diagnosis not present

## 2022-02-07 DIAGNOSIS — G301 Alzheimer's disease with late onset: Secondary | ICD-10-CM | POA: Diagnosis not present

## 2022-02-12 ENCOUNTER — Ambulatory Visit (INDEPENDENT_AMBULATORY_CARE_PROVIDER_SITE_OTHER): Payer: Medicare Other | Admitting: Physician Assistant

## 2022-02-12 DIAGNOSIS — N184 Chronic kidney disease, stage 4 (severe): Secondary | ICD-10-CM | POA: Diagnosis not present

## 2022-02-12 DIAGNOSIS — R2681 Unsteadiness on feet: Secondary | ICD-10-CM | POA: Diagnosis not present

## 2022-02-12 DIAGNOSIS — R278 Other lack of coordination: Secondary | ICD-10-CM | POA: Diagnosis not present

## 2022-02-12 DIAGNOSIS — L84 Corns and callosities: Secondary | ICD-10-CM | POA: Diagnosis not present

## 2022-02-12 DIAGNOSIS — B351 Tinea unguium: Secondary | ICD-10-CM | POA: Diagnosis not present

## 2022-02-12 DIAGNOSIS — N1832 Chronic kidney disease, stage 3b: Secondary | ICD-10-CM | POA: Diagnosis not present

## 2022-02-12 DIAGNOSIS — S81811D Laceration without foreign body, right lower leg, subsequent encounter: Secondary | ICD-10-CM

## 2022-02-12 DIAGNOSIS — M79675 Pain in left toe(s): Secondary | ICD-10-CM | POA: Diagnosis not present

## 2022-02-12 DIAGNOSIS — M6281 Muscle weakness (generalized): Secondary | ICD-10-CM | POA: Diagnosis not present

## 2022-02-12 DIAGNOSIS — G301 Alzheimer's disease with late onset: Secondary | ICD-10-CM | POA: Diagnosis not present

## 2022-02-12 DIAGNOSIS — L603 Nail dystrophy: Secondary | ICD-10-CM | POA: Diagnosis not present

## 2022-02-12 DIAGNOSIS — R296 Repeated falls: Secondary | ICD-10-CM | POA: Diagnosis not present

## 2022-02-12 DIAGNOSIS — M79674 Pain in right toe(s): Secondary | ICD-10-CM | POA: Diagnosis not present

## 2022-02-12 DIAGNOSIS — R269 Unspecified abnormalities of gait and mobility: Secondary | ICD-10-CM | POA: Diagnosis not present

## 2022-02-12 NOTE — Progress Notes (Signed)
   Referring Provider Glean Hess, MD [address] [place],  [place] [address]   CC:  Chief Complaint  Patient presents with   Follow-up      Laura Wilcox is an 88 y.o. female.  HPI: This is a 88 year old female seen in our office for follow-up evaluation status post skin tear to right lower leg.  She was most recently seen in the office on 01/23/2022.  In brief her wound was initially managed with endoform sheets, she was switched over to [facility].  She progressed very well and had significant improvements over the last few visits.  She is accompanied by her daughter who has come to all of her visits, she denies any issues or concerns, she denies any infectious signs or symptoms.  She notes that the nursing home had some clear dressing over the wound when she arrived today but was uncertain what it was.   Review of Systems General: negative for fever  Physical Exam    01/29/2022    1:56 PM 12/17/2021    2:49 PM 12/17/2021    2:36 PM  Vitals with BMI  Height  '5\' 0"'$  '5\' 0"'$   Weight 122 lbs 14 oz 118 lbs 13 oz   BMI  19.3   Systolic 790 240   Diastolic 98 65   Pulse 85 85     General:  No acute distress,  Alert and oriented, Non-Toxic, Normal speech and affect Right lower extremity with completely healed wound to the anterior aspect, no surrounding redness or warmth  Assessment/Plan The patient's wound has completely healed, she has no ongoing issues.  At this time the patient may follow-up in our office on a as needed basis.  Laura Wilcox Laura Wilcox 02/12/2022, 3:57 PM

## 2022-02-13 ENCOUNTER — Ambulatory Visit: Payer: PRIVATE HEALTH INSURANCE | Admitting: Physician Assistant

## 2022-02-13 ENCOUNTER — Other Ambulatory Visit (HOSPITAL_COMMUNITY): Payer: Self-pay

## 2022-02-13 DIAGNOSIS — M6281 Muscle weakness (generalized): Secondary | ICD-10-CM | POA: Diagnosis not present

## 2022-02-13 DIAGNOSIS — R296 Repeated falls: Secondary | ICD-10-CM | POA: Diagnosis not present

## 2022-02-13 DIAGNOSIS — G301 Alzheimer's disease with late onset: Secondary | ICD-10-CM | POA: Diagnosis not present

## 2022-02-13 DIAGNOSIS — N1832 Chronic kidney disease, stage 3b: Secondary | ICD-10-CM | POA: Diagnosis not present

## 2022-02-13 DIAGNOSIS — R278 Other lack of coordination: Secondary | ICD-10-CM | POA: Diagnosis not present

## 2022-02-13 DIAGNOSIS — R2681 Unsteadiness on feet: Secondary | ICD-10-CM | POA: Diagnosis not present

## 2022-02-14 ENCOUNTER — Other Ambulatory Visit (HOSPITAL_COMMUNITY): Payer: Self-pay

## 2022-02-14 DIAGNOSIS — M6281 Muscle weakness (generalized): Secondary | ICD-10-CM | POA: Diagnosis not present

## 2022-02-14 DIAGNOSIS — R296 Repeated falls: Secondary | ICD-10-CM | POA: Diagnosis not present

## 2022-02-14 DIAGNOSIS — G301 Alzheimer's disease with late onset: Secondary | ICD-10-CM | POA: Diagnosis not present

## 2022-02-14 DIAGNOSIS — R2681 Unsteadiness on feet: Secondary | ICD-10-CM | POA: Diagnosis not present

## 2022-02-14 DIAGNOSIS — N1832 Chronic kidney disease, stage 3b: Secondary | ICD-10-CM | POA: Diagnosis not present

## 2022-02-14 DIAGNOSIS — R278 Other lack of coordination: Secondary | ICD-10-CM | POA: Diagnosis not present

## 2022-02-15 DIAGNOSIS — R296 Repeated falls: Secondary | ICD-10-CM | POA: Diagnosis not present

## 2022-02-15 DIAGNOSIS — G301 Alzheimer's disease with late onset: Secondary | ICD-10-CM | POA: Diagnosis not present

## 2022-02-15 DIAGNOSIS — R2681 Unsteadiness on feet: Secondary | ICD-10-CM | POA: Diagnosis not present

## 2022-02-15 DIAGNOSIS — N1832 Chronic kidney disease, stage 3b: Secondary | ICD-10-CM | POA: Diagnosis not present

## 2022-02-15 DIAGNOSIS — M6281 Muscle weakness (generalized): Secondary | ICD-10-CM | POA: Diagnosis not present

## 2022-02-15 DIAGNOSIS — R278 Other lack of coordination: Secondary | ICD-10-CM | POA: Diagnosis not present

## 2022-02-18 DIAGNOSIS — N1832 Chronic kidney disease, stage 3b: Secondary | ICD-10-CM | POA: Diagnosis not present

## 2022-02-18 DIAGNOSIS — R2681 Unsteadiness on feet: Secondary | ICD-10-CM | POA: Diagnosis not present

## 2022-02-18 DIAGNOSIS — R296 Repeated falls: Secondary | ICD-10-CM | POA: Diagnosis not present

## 2022-02-18 DIAGNOSIS — G301 Alzheimer's disease with late onset: Secondary | ICD-10-CM | POA: Diagnosis not present

## 2022-02-18 DIAGNOSIS — M6281 Muscle weakness (generalized): Secondary | ICD-10-CM | POA: Diagnosis not present

## 2022-02-18 DIAGNOSIS — R278 Other lack of coordination: Secondary | ICD-10-CM | POA: Diagnosis not present

## 2022-02-19 DIAGNOSIS — M6281 Muscle weakness (generalized): Secondary | ICD-10-CM | POA: Diagnosis not present

## 2022-02-19 DIAGNOSIS — R278 Other lack of coordination: Secondary | ICD-10-CM | POA: Diagnosis not present

## 2022-02-19 DIAGNOSIS — G301 Alzheimer's disease with late onset: Secondary | ICD-10-CM | POA: Diagnosis not present

## 2022-02-19 DIAGNOSIS — R2681 Unsteadiness on feet: Secondary | ICD-10-CM | POA: Diagnosis not present

## 2022-02-19 DIAGNOSIS — R296 Repeated falls: Secondary | ICD-10-CM | POA: Diagnosis not present

## 2022-02-19 DIAGNOSIS — N1832 Chronic kidney disease, stage 3b: Secondary | ICD-10-CM | POA: Diagnosis not present

## 2022-02-20 DIAGNOSIS — R269 Unspecified abnormalities of gait and mobility: Secondary | ICD-10-CM | POA: Diagnosis not present

## 2022-02-20 DIAGNOSIS — S81811D Laceration without foreign body, right lower leg, subsequent encounter: Secondary | ICD-10-CM | POA: Diagnosis not present

## 2022-02-20 DIAGNOSIS — R278 Other lack of coordination: Secondary | ICD-10-CM | POA: Diagnosis not present

## 2022-02-20 DIAGNOSIS — F02B0 Dementia in other diseases classified elsewhere, moderate, without behavioral disturbance, psychotic disturbance, mood disturbance, and anxiety: Secondary | ICD-10-CM | POA: Diagnosis not present

## 2022-02-20 DIAGNOSIS — E559 Vitamin D deficiency, unspecified: Secondary | ICD-10-CM | POA: Diagnosis not present

## 2022-02-20 DIAGNOSIS — D45 Polycythemia vera: Secondary | ICD-10-CM | POA: Diagnosis not present

## 2022-02-20 DIAGNOSIS — R296 Repeated falls: Secondary | ICD-10-CM | POA: Diagnosis not present

## 2022-02-20 DIAGNOSIS — R2681 Unsteadiness on feet: Secondary | ICD-10-CM | POA: Diagnosis not present

## 2022-02-20 DIAGNOSIS — N1832 Chronic kidney disease, stage 3b: Secondary | ICD-10-CM | POA: Diagnosis not present

## 2022-02-20 DIAGNOSIS — M6281 Muscle weakness (generalized): Secondary | ICD-10-CM | POA: Diagnosis not present

## 2022-02-20 DIAGNOSIS — G301 Alzheimer's disease with late onset: Secondary | ICD-10-CM | POA: Diagnosis not present

## 2022-02-22 DIAGNOSIS — M6281 Muscle weakness (generalized): Secondary | ICD-10-CM | POA: Diagnosis not present

## 2022-02-22 DIAGNOSIS — R278 Other lack of coordination: Secondary | ICD-10-CM | POA: Diagnosis not present

## 2022-02-22 DIAGNOSIS — G301 Alzheimer's disease with late onset: Secondary | ICD-10-CM | POA: Diagnosis not present

## 2022-02-22 DIAGNOSIS — R296 Repeated falls: Secondary | ICD-10-CM | POA: Diagnosis not present

## 2022-02-22 DIAGNOSIS — N1832 Chronic kidney disease, stage 3b: Secondary | ICD-10-CM | POA: Diagnosis not present

## 2022-02-22 DIAGNOSIS — R2681 Unsteadiness on feet: Secondary | ICD-10-CM | POA: Diagnosis not present

## 2022-02-25 DIAGNOSIS — G301 Alzheimer's disease with late onset: Secondary | ICD-10-CM | POA: Diagnosis not present

## 2022-02-25 DIAGNOSIS — R296 Repeated falls: Secondary | ICD-10-CM | POA: Diagnosis not present

## 2022-02-25 DIAGNOSIS — R278 Other lack of coordination: Secondary | ICD-10-CM | POA: Diagnosis not present

## 2022-02-25 DIAGNOSIS — N1832 Chronic kidney disease, stage 3b: Secondary | ICD-10-CM | POA: Diagnosis not present

## 2022-02-25 DIAGNOSIS — R2681 Unsteadiness on feet: Secondary | ICD-10-CM | POA: Diagnosis not present

## 2022-02-25 DIAGNOSIS — M6281 Muscle weakness (generalized): Secondary | ICD-10-CM | POA: Diagnosis not present

## 2022-02-26 ENCOUNTER — Ambulatory Visit (INDEPENDENT_AMBULATORY_CARE_PROVIDER_SITE_OTHER): Payer: Medicare Other | Admitting: Dermatology

## 2022-02-26 DIAGNOSIS — R278 Other lack of coordination: Secondary | ICD-10-CM | POA: Diagnosis not present

## 2022-02-26 DIAGNOSIS — D485 Neoplasm of uncertain behavior of skin: Secondary | ICD-10-CM

## 2022-02-26 DIAGNOSIS — G301 Alzheimer's disease with late onset: Secondary | ICD-10-CM | POA: Diagnosis not present

## 2022-02-26 DIAGNOSIS — R296 Repeated falls: Secondary | ICD-10-CM | POA: Diagnosis not present

## 2022-02-26 DIAGNOSIS — E785 Hyperlipidemia, unspecified: Secondary | ICD-10-CM | POA: Diagnosis not present

## 2022-02-26 DIAGNOSIS — D0461 Carcinoma in situ of skin of right upper limb, including shoulder: Secondary | ICD-10-CM

## 2022-02-26 DIAGNOSIS — D0439 Carcinoma in situ of skin of other parts of face: Secondary | ICD-10-CM | POA: Diagnosis not present

## 2022-02-26 DIAGNOSIS — D099 Carcinoma in situ, unspecified: Secondary | ICD-10-CM

## 2022-02-26 DIAGNOSIS — M6281 Muscle weakness (generalized): Secondary | ICD-10-CM | POA: Diagnosis not present

## 2022-02-26 DIAGNOSIS — N1832 Chronic kidney disease, stage 3b: Secondary | ICD-10-CM | POA: Diagnosis not present

## 2022-02-26 DIAGNOSIS — Z85828 Personal history of other malignant neoplasm of skin: Secondary | ICD-10-CM | POA: Diagnosis not present

## 2022-02-26 DIAGNOSIS — L578 Other skin changes due to chronic exposure to nonionizing radiation: Secondary | ICD-10-CM | POA: Diagnosis not present

## 2022-02-26 DIAGNOSIS — R2681 Unsteadiness on feet: Secondary | ICD-10-CM | POA: Diagnosis not present

## 2022-02-26 DIAGNOSIS — E039 Hypothyroidism, unspecified: Secondary | ICD-10-CM | POA: Diagnosis not present

## 2022-02-26 NOTE — Progress Notes (Signed)
Follow-Up Visit   Subjective  [name] is a 88 y.o. female who presents for the following: Follow-up (Patient here today for bx follow up and biopsy at hand. ).  Patient accompanied by daughter who contributes to history.  The following portions of the chart were reviewed this encounter and updated as appropriate:       Review of Systems:  No other skin or systemic complaints except as noted in HPI or Assessment and Plan.  Objective  Well appearing patient in no apparent distress; mood and affect are within normal limits.  A full examination was performed including scalp, head, eyes, ears, nose, lips, neck, chest, axillae, abdomen, back, buttocks, bilateral upper extremities, bilateral lower extremities, hands, feet, fingers, toes, fingernails, and toenails. All findings within normal limits unless otherwise noted below.  Right Ulnar Dorsal Hand 2.0 CM pink scaly patch        left lateral cheek Pink biopsy site    Assessment & Plan  Neoplasm of uncertain behavior of skin Right Ulnar Dorsal Hand  Skin / nail biopsy Type of biopsy: tangential   Informed consent: discussed and consent obtained   Patient was prepped and draped in usual sterile fashion: Area prepped with alcohol. Anesthesia: the lesion was anesthetized in a standard fashion   Anesthetic:  1% lidocaine w/ epinephrine 1-100,000 buffered w/ 8.4% NaHCO3 Instrument used: flexible razor blade   Hemostasis achieved with: pressure, aluminum chloride and electrodesiccation   Outcome: patient tolerated procedure well   Post-procedure details: wound care instructions given   Post-procedure details comment:  Ointment and small bandage applied  Specimen 1 - Surgical pathology Differential Diagnosis: SCCis vs Other  Check Margins: No 2.0 CM pink scaly patch  Squamous cell carcinoma in situ (SCCIS) left lateral cheek  Bx-proven  Patient will treat with topical 5FU/calcipotriene bid for 2-3 weeks, pt has  not started yet, med will arrive soon by mail.  She will also treat the L lateral eye area EDC site.  Avoid contact with eye.  5-fluorouracil/calcipotriene cream is is a type of field treatment used to treat precancers, thin skin cancers, and areas of sun damage. Expected reaction includes irritation and mild inflammation potentially progressing to more severe inflammation including redness, scaling, crusting and open sores/erosions.  If too much irritation occurs, ensure application of only a thin layer and decrease frequency of use to achieve a tolerable level of inflammation. Recommend applying Vaseline ointment to open sores as needed.  Minimize sun exposure while under treatment. Recommend daily broad spectrum sunscreen SPF 30+ to sun-exposed areas, reapply every 2 hours as needed.       History of Squamous Cell Carcinoma of the Skin - No evidence of recurrence today at left lateral eye - Recommend regular full body skin exams - Recommend daily broad spectrum sunscreen SPF 30+ to sun-exposed areas, reapply every 2 hours as needed.  - Call if any new or changing lesions are noted between office visits  Actinic Damage - chronic, secondary to cumulative UV radiation exposure/sun exposure over time - diffuse scaly erythematous macules with underlying dyspigmentation - Recommend daily broad spectrum sunscreen SPF 30+ to sun-exposed areas, reapply every 2 hours as needed.  - Recommend staying in the shade or wearing long sleeves, sun glasses (UVA+UVB protection) and wide brim hats (4-inch brim around the entire circumference of the hat). - Call for new or changing lesions.   Return in about 3 weeks (around 03/19/2022) for Biopsy follow up, SCCIS f/up.  Graciella Belton, RMA,  am acting as scribe for Brendolyn Patty, MD .  Documentation: I have reviewed the above documentation for accuracy and completeness, and I agree with the above.  Brendolyn Patty MD

## 2022-02-26 NOTE — Patient Instructions (Addendum)
Start topical chemotherapy 5FU/Vit.D cream twice daily for 2 weeks to entire red scaly area on L cheek, avoid getting in eye.   Wound Care Instructions  Cleanse wound gently with soap and water once a day then pat dry with clean gauze. Apply a thin coat of Petrolatum (petroleum jelly, "Vaseline") over the wound (unless you have an allergy to this). We recommend that you use a new, sterile tube of Vaseline. Do not pick or remove scabs. Do not remove the yellow or white "healing tissue" from the base of the wound.  Cover the wound with fresh, clean, nonstick gauze and secure with paper tape. You may use Band-Aids in place of gauze and tape if the wound is small enough, but would recommend trimming much of the tape off as there is often too much. Sometimes Band-Aids can irritate the skin.  You should call the office for your biopsy report after 1 week if you have not already been contacted.  If you experience any problems, such as abnormal amounts of bleeding, swelling, significant bruising, significant pain, or evidence of infection, please call the office immediately.  FOR ADULT SURGERY PATIENTS: If you need something for pain relief you may take 1 extra strength Tylenol (acetaminophen) AND 2 Ibuprofen ('200mg'$  each) together every 4 hours as needed for pain. (do not take these if you are allergic to them or if you have a reason you should not take them.) Typically, you may only need pain medication for 1 to 3 days.    Due to recent changes in healthcare laws, you may see results of your pathology and/or laboratory studies on MyChart before the doctors have had a chance to review them. We understand that in some cases there may be results that are confusing or concerning to you. Please understand that not all results are received at the same time and often the doctors may need to interpret multiple results in order to provide you with the best plan of care or course of treatment. Therefore, we ask that  you please give [place] 2 business days to thoroughly review all your results before contacting the office for clarification. Should we see a critical lab result, you will be contacted sooner.   If You Need Anything After Your Visit  If you have any questions or concerns for your doctor, please call our main line at [phone] and press option 4 to reach your doctor's medical assistant. If no one answers, please leave a voicemail as directed and we will return your call as soon as possible. Messages left after 4 pm will be answered the following business day.   You may also send [place] a message via [place]. We typically respond to MyChart messages within 1-2 business days.  For prescription refills, please ask your pharmacy to contact our office. Our fax number is [phone].  If you have an urgent issue when the clinic is closed that cannot wait until the next business day, you can page your doctor at the number below.    Please note that while we do our best to be available for urgent issues outside of office hours, we are not available 24/7.   If you have an urgent issue and are unable to reach [place], you may choose to seek medical care at your doctor's office, retail clinic, urgent care center, or emergency room.  If you have a medical emergency, please immediately call 911 or go to the emergency department.  Pager Numbers  - Dr. Nehemiah Massed: [phone]  -  Dr. Moye: [phone]  - Dr. Stewart: [phone]  In the event of inclement weather, please call our main line at [phone] for an update on the status of any delays or closures.  Dermatology Medication Tips: Please keep the boxes that topical medications come in in order to help keep track of the instructions about where and how to use these. Pharmacies typically print the medication instructions only on the boxes and not directly on the medication tubes.   If your medication is too expensive, please contact our office at  [phone] option 4 or send us a message through MyChart.   We are unable to tell what your co-pay for medications will be in advance as this is different depending on your insurance coverage. However, we may be able to find a substitute medication at lower cost or fill out paperwork to get insurance to cover a needed medication.   If a prior authorization is required to get your medication covered by your insurance company, please allow us 1-2 business days to complete this process.  Drug prices often vary depending on where the prescription is filled and some pharmacies may offer cheaper prices.  The website www.goodrx.com contains coupons for medications through different pharmacies. The prices here do not account for what the cost may be with help from insurance (it may be cheaper with your insurance), but the website can give you the price if you did not use any insurance.  - You can print the associated coupon and take it with your prescription to the pharmacy.  - You may also stop by our office during regular business hours and pick up a GoodRx coupon card.  - If you need your prescription sent electronically to a different pharmacy, notify our office through [facility] MyChart or by phone at [phone] option 4.     Si Usted Necesita Algo Despus de Su Visita  Tambin puede enviarnos un mensaje a travs de MyChart. Por lo general respondemos a los mensajes de MyChart en el transcurso de 1 a 2 das hbiles.  Para renovar recetas, por favor pida a su farmacia que se ponga en contacto con nuestra oficina. Nuestro nmero de fax es el [phone].  Si tiene un asunto urgente cuando la clnica est cerrada y que no puede esperar hasta el siguiente da hbil, puede llamar/localizar a su doctor(a) al nmero que aparece a continuacin.   Por favor, tenga en cuenta que aunque hacemos todo lo posible para estar disponibles para asuntos urgentes fuera del horario de oficina, no estamos  disponibles las 24 horas del da, los 7 das de la semana.   Si tiene un problema urgente y no puede comunicarse con nosotros, puede optar por buscar atencin mdica  en el consultorio de su doctor(a), en una clnica privada, en un centro de atencin urgente o en una sala de emergencias.  Si tiene una emergencia mdica, por favor llame inmediatamente al 911 o vaya a la sala de emergencias.  Nmeros de bper  - Dr. Kowalski: [phone]  - Dra. Moye: [phone]  - Dra. Stewart: [phone]  En caso de inclemencias del tiempo, por favor llame a nuestra lnea principal al [phone] para una actualizacin sobre el estado de cualquier retraso o cierre.  Consejos para la medicacin en dermatologa: Por favor, guarde las cajas en las que vienen los medicamentos de uso tpico para ayudarle a seguir las instrucciones sobre dnde y cmo usarlos. Las farmacias generalmente imprimen las instrucciones del medicamento slo en las cajas y   no directamente en los tubos del medicamento.   Si su medicamento es muy caro, por favor, pngase en contacto con nuestra oficina llamando al [phone] y presione la opcin 4 o envenos un mensaje a travs de MyChart.   No podemos decirle cul ser su copago por los medicamentos por adelantado ya que esto es diferente dependiendo de la cobertura de su seguro. Sin embargo, es posible que podamos encontrar un medicamento sustituto a menor costo o llenar un formulario para que el seguro cubra el medicamento que se considera necesario.   Si se requiere una autorizacin previa para que su compaa de seguros cubra su medicamento, por favor permtanos de 1 a 2 das hbiles para completar este proceso.  Los precios de los medicamentos varan con frecuencia dependiendo del lugar de dnde se surte la receta y alguna farmacias pueden ofrecer precios ms baratos.  El sitio web www.goodrx.com tiene cupones para medicamentos de diferentes farmacias. Los precios aqu no  tienen en cuenta lo que podra costar con la ayuda del seguro (puede ser ms barato con su seguro), pero el sitio web puede darle el precio si no utiliz ningn seguro.  - Puede imprimir el cupn correspondiente y llevarlo con su receta a la farmacia.  - Tambin puede pasar por nuestra oficina durante el horario de atencin regular y recoger una tarjeta de cupones de GoodRx.  - Si necesita que su receta se enve electrnicamente a una farmacia diferente, informe a nuestra oficina a travs de MyChart de [facility] o por telfono llamando al [phone] y presione la opcin 4.  

## 2022-02-27 DIAGNOSIS — F02B0 Dementia in other diseases classified elsewhere, moderate, without behavioral disturbance, psychotic disturbance, mood disturbance, and anxiety: Secondary | ICD-10-CM | POA: Diagnosis not present

## 2022-02-27 DIAGNOSIS — K5901 Slow transit constipation: Secondary | ICD-10-CM | POA: Diagnosis not present

## 2022-02-27 DIAGNOSIS — E559 Vitamin D deficiency, unspecified: Secondary | ICD-10-CM | POA: Diagnosis not present

## 2022-02-27 DIAGNOSIS — R296 Repeated falls: Secondary | ICD-10-CM | POA: Diagnosis not present

## 2022-02-27 DIAGNOSIS — E441 Mild protein-calorie malnutrition: Secondary | ICD-10-CM | POA: Diagnosis not present

## 2022-02-27 DIAGNOSIS — G301 Alzheimer's disease with late onset: Secondary | ICD-10-CM | POA: Diagnosis not present

## 2022-02-27 DIAGNOSIS — Z0001 Encounter for general adult medical examination with abnormal findings: Secondary | ICD-10-CM | POA: Diagnosis not present

## 2022-02-27 DIAGNOSIS — D45 Polycythemia vera: Secondary | ICD-10-CM | POA: Diagnosis not present

## 2022-02-27 DIAGNOSIS — N1832 Chronic kidney disease, stage 3b: Secondary | ICD-10-CM | POA: Diagnosis not present

## 2022-02-27 DIAGNOSIS — R2681 Unsteadiness on feet: Secondary | ICD-10-CM | POA: Diagnosis not present

## 2022-02-27 DIAGNOSIS — I739 Peripheral vascular disease, unspecified: Secondary | ICD-10-CM | POA: Diagnosis not present

## 2022-02-27 DIAGNOSIS — R278 Other lack of coordination: Secondary | ICD-10-CM | POA: Diagnosis not present

## 2022-02-27 DIAGNOSIS — M6281 Muscle weakness (generalized): Secondary | ICD-10-CM | POA: Diagnosis not present

## 2022-02-27 DIAGNOSIS — F331 Major depressive disorder, recurrent, moderate: Secondary | ICD-10-CM | POA: Diagnosis not present

## 2022-03-01 DIAGNOSIS — R300 Dysuria: Secondary | ICD-10-CM | POA: Diagnosis not present

## 2022-03-04 ENCOUNTER — Telehealth: Payer: Self-pay

## 2022-03-04 DIAGNOSIS — M6281 Muscle weakness (generalized): Secondary | ICD-10-CM | POA: Diagnosis not present

## 2022-03-04 DIAGNOSIS — N1832 Chronic kidney disease, stage 3b: Secondary | ICD-10-CM | POA: Diagnosis not present

## 2022-03-04 DIAGNOSIS — R296 Repeated falls: Secondary | ICD-10-CM | POA: Diagnosis not present

## 2022-03-04 DIAGNOSIS — R278 Other lack of coordination: Secondary | ICD-10-CM | POA: Diagnosis not present

## 2022-03-04 DIAGNOSIS — R2681 Unsteadiness on feet: Secondary | ICD-10-CM | POA: Diagnosis not present

## 2022-03-04 DIAGNOSIS — G301 Alzheimer's disease with late onset: Secondary | ICD-10-CM | POA: Diagnosis not present

## 2022-03-04 NOTE — Telephone Encounter (Signed)
-----   Message from Brendolyn Patty, MD sent at 03/04/2022 12:55 PM EST ----- Skin , right ulnar dorsal hand SQUAMOUS CELL CARCINOMA IN SITU, HYPERTROPHIC, BASE INVOLVED   SCCIS, hypertrophic, recommend EDC of entire lesion.  May also consider using 5FU/Vit D cream bid for 3-4 wks, but because it is thicker, it might not work as well as [place].  - please call patient

## 2022-03-04 NOTE — Telephone Encounter (Signed)
Advised pt's daughter of bx results.  Advised we can treat the SCC IS on f/u appt./sh

## 2022-03-05 DIAGNOSIS — M6281 Muscle weakness (generalized): Secondary | ICD-10-CM | POA: Diagnosis not present

## 2022-03-05 DIAGNOSIS — R278 Other lack of coordination: Secondary | ICD-10-CM | POA: Diagnosis not present

## 2022-03-05 DIAGNOSIS — R296 Repeated falls: Secondary | ICD-10-CM | POA: Diagnosis not present

## 2022-03-05 DIAGNOSIS — R2681 Unsteadiness on feet: Secondary | ICD-10-CM | POA: Diagnosis not present

## 2022-03-05 DIAGNOSIS — N1832 Chronic kidney disease, stage 3b: Secondary | ICD-10-CM | POA: Diagnosis not present

## 2022-03-05 DIAGNOSIS — G301 Alzheimer's disease with late onset: Secondary | ICD-10-CM | POA: Diagnosis not present

## 2022-03-06 DIAGNOSIS — R278 Other lack of coordination: Secondary | ICD-10-CM | POA: Diagnosis not present

## 2022-03-06 DIAGNOSIS — R2681 Unsteadiness on feet: Secondary | ICD-10-CM | POA: Diagnosis not present

## 2022-03-06 DIAGNOSIS — N1832 Chronic kidney disease, stage 3b: Secondary | ICD-10-CM | POA: Diagnosis not present

## 2022-03-06 DIAGNOSIS — G301 Alzheimer's disease with late onset: Secondary | ICD-10-CM | POA: Diagnosis not present

## 2022-03-06 DIAGNOSIS — M6281 Muscle weakness (generalized): Secondary | ICD-10-CM | POA: Diagnosis not present

## 2022-03-06 DIAGNOSIS — R296 Repeated falls: Secondary | ICD-10-CM | POA: Diagnosis not present

## 2022-03-07 DIAGNOSIS — R059 Cough, unspecified: Secondary | ICD-10-CM | POA: Diagnosis not present

## 2022-03-07 DIAGNOSIS — R0989 Other specified symptoms and signs involving the circulatory and respiratory systems: Secondary | ICD-10-CM | POA: Diagnosis not present

## 2022-03-07 DIAGNOSIS — R0981 Nasal congestion: Secondary | ICD-10-CM | POA: Diagnosis not present

## 2022-03-07 DIAGNOSIS — R5381 Other malaise: Secondary | ICD-10-CM | POA: Diagnosis not present

## 2022-03-07 DIAGNOSIS — R062 Wheezing: Secondary | ICD-10-CM | POA: Diagnosis not present

## 2022-03-08 DIAGNOSIS — E782 Mixed hyperlipidemia: Secondary | ICD-10-CM | POA: Diagnosis not present

## 2022-03-08 DIAGNOSIS — E559 Vitamin D deficiency, unspecified: Secondary | ICD-10-CM | POA: Diagnosis not present

## 2022-03-08 DIAGNOSIS — Z79899 Other long term (current) drug therapy: Secondary | ICD-10-CM | POA: Diagnosis not present

## 2022-03-11 ENCOUNTER — Other Ambulatory Visit (HOSPITAL_COMMUNITY): Payer: Self-pay

## 2022-03-11 DIAGNOSIS — N1832 Chronic kidney disease, stage 3b: Secondary | ICD-10-CM | POA: Diagnosis not present

## 2022-03-11 DIAGNOSIS — G301 Alzheimer's disease with late onset: Secondary | ICD-10-CM | POA: Diagnosis not present

## 2022-03-11 DIAGNOSIS — M6281 Muscle weakness (generalized): Secondary | ICD-10-CM | POA: Diagnosis not present

## 2022-03-11 DIAGNOSIS — R2681 Unsteadiness on feet: Secondary | ICD-10-CM | POA: Diagnosis not present

## 2022-03-11 DIAGNOSIS — R296 Repeated falls: Secondary | ICD-10-CM | POA: Diagnosis not present

## 2022-03-11 DIAGNOSIS — R278 Other lack of coordination: Secondary | ICD-10-CM | POA: Diagnosis not present

## 2022-03-12 ENCOUNTER — Other Ambulatory Visit: Payer: Self-pay

## 2022-03-12 DIAGNOSIS — G301 Alzheimer's disease with late onset: Secondary | ICD-10-CM | POA: Diagnosis not present

## 2022-03-12 DIAGNOSIS — R278 Other lack of coordination: Secondary | ICD-10-CM | POA: Diagnosis not present

## 2022-03-12 DIAGNOSIS — R296 Repeated falls: Secondary | ICD-10-CM | POA: Diagnosis not present

## 2022-03-12 DIAGNOSIS — N1832 Chronic kidney disease, stage 3b: Secondary | ICD-10-CM | POA: Diagnosis not present

## 2022-03-12 DIAGNOSIS — M6281 Muscle weakness (generalized): Secondary | ICD-10-CM | POA: Diagnosis not present

## 2022-03-12 DIAGNOSIS — R2681 Unsteadiness on feet: Secondary | ICD-10-CM | POA: Diagnosis not present

## 2022-03-13 DIAGNOSIS — R278 Other lack of coordination: Secondary | ICD-10-CM | POA: Diagnosis not present

## 2022-03-13 DIAGNOSIS — E039 Hypothyroidism, unspecified: Secondary | ICD-10-CM | POA: Diagnosis not present

## 2022-03-13 DIAGNOSIS — R051 Acute cough: Secondary | ICD-10-CM | POA: Diagnosis not present

## 2022-03-13 DIAGNOSIS — R062 Wheezing: Secondary | ICD-10-CM | POA: Diagnosis not present

## 2022-03-13 DIAGNOSIS — M6281 Muscle weakness (generalized): Secondary | ICD-10-CM | POA: Diagnosis not present

## 2022-03-13 DIAGNOSIS — R2681 Unsteadiness on feet: Secondary | ICD-10-CM | POA: Diagnosis not present

## 2022-03-13 DIAGNOSIS — J069 Acute upper respiratory infection, unspecified: Secondary | ICD-10-CM | POA: Diagnosis not present

## 2022-03-13 DIAGNOSIS — N1832 Chronic kidney disease, stage 3b: Secondary | ICD-10-CM | POA: Diagnosis not present

## 2022-03-13 DIAGNOSIS — G301 Alzheimer's disease with late onset: Secondary | ICD-10-CM | POA: Diagnosis not present

## 2022-03-13 DIAGNOSIS — D45 Polycythemia vera: Secondary | ICD-10-CM | POA: Diagnosis not present

## 2022-03-13 DIAGNOSIS — F02B0 Dementia in other diseases classified elsewhere, moderate, without behavioral disturbance, psychotic disturbance, mood disturbance, and anxiety: Secondary | ICD-10-CM | POA: Diagnosis not present

## 2022-03-13 DIAGNOSIS — R296 Repeated falls: Secondary | ICD-10-CM | POA: Diagnosis not present

## 2022-03-13 DIAGNOSIS — E785 Hyperlipidemia, unspecified: Secondary | ICD-10-CM | POA: Diagnosis not present

## 2022-03-14 DIAGNOSIS — R278 Other lack of coordination: Secondary | ICD-10-CM | POA: Diagnosis not present

## 2022-03-14 DIAGNOSIS — R296 Repeated falls: Secondary | ICD-10-CM | POA: Diagnosis not present

## 2022-03-14 DIAGNOSIS — R2681 Unsteadiness on feet: Secondary | ICD-10-CM | POA: Diagnosis not present

## 2022-03-14 DIAGNOSIS — G301 Alzheimer's disease with late onset: Secondary | ICD-10-CM | POA: Diagnosis not present

## 2022-03-14 DIAGNOSIS — N1832 Chronic kidney disease, stage 3b: Secondary | ICD-10-CM | POA: Diagnosis not present

## 2022-03-14 DIAGNOSIS — M6281 Muscle weakness (generalized): Secondary | ICD-10-CM | POA: Diagnosis not present

## 2022-03-14 DIAGNOSIS — E039 Hypothyroidism, unspecified: Secondary | ICD-10-CM | POA: Diagnosis not present

## 2022-03-18 ENCOUNTER — Encounter: Payer: Self-pay | Admitting: Nurse Practitioner

## 2022-03-18 ENCOUNTER — Ambulatory Visit (INDEPENDENT_AMBULATORY_CARE_PROVIDER_SITE_OTHER): Payer: Medicare Other | Admitting: Dermatology

## 2022-03-18 ENCOUNTER — Ambulatory Visit: Payer: PRIVATE HEALTH INSURANCE | Admitting: Dermatology

## 2022-03-18 ENCOUNTER — Non-Acute Institutional Stay: Payer: Medicare Other | Admitting: Nurse Practitioner

## 2022-03-18 DIAGNOSIS — M6281 Muscle weakness (generalized): Secondary | ICD-10-CM | POA: Diagnosis not present

## 2022-03-18 DIAGNOSIS — D0439 Carcinoma in situ of skin of other parts of face: Secondary | ICD-10-CM | POA: Diagnosis not present

## 2022-03-18 DIAGNOSIS — Z515 Encounter for palliative care: Secondary | ICD-10-CM | POA: Diagnosis not present

## 2022-03-18 DIAGNOSIS — F0393 Unspecified dementia, unspecified severity, with mood disturbance: Secondary | ICD-10-CM

## 2022-03-18 DIAGNOSIS — R2681 Unsteadiness on feet: Secondary | ICD-10-CM | POA: Diagnosis not present

## 2022-03-18 DIAGNOSIS — Z85828 Personal history of other malignant neoplasm of skin: Secondary | ICD-10-CM

## 2022-03-18 DIAGNOSIS — D099 Carcinoma in situ, unspecified: Secondary | ICD-10-CM

## 2022-03-18 DIAGNOSIS — E440 Moderate protein-calorie malnutrition: Secondary | ICD-10-CM | POA: Diagnosis not present

## 2022-03-18 DIAGNOSIS — G301 Alzheimer's disease with late onset: Secondary | ICD-10-CM | POA: Diagnosis not present

## 2022-03-18 DIAGNOSIS — L578 Other skin changes due to chronic exposure to nonionizing radiation: Secondary | ICD-10-CM | POA: Diagnosis not present

## 2022-03-18 DIAGNOSIS — N1832 Chronic kidney disease, stage 3b: Secondary | ICD-10-CM | POA: Diagnosis not present

## 2022-03-18 DIAGNOSIS — R278 Other lack of coordination: Secondary | ICD-10-CM | POA: Diagnosis not present

## 2022-03-18 DIAGNOSIS — R296 Repeated falls: Secondary | ICD-10-CM | POA: Diagnosis not present

## 2022-03-18 DIAGNOSIS — L570 Actinic keratosis: Secondary | ICD-10-CM | POA: Diagnosis not present

## 2022-03-18 NOTE — Progress Notes (Signed)
Follow-Up Visit   Subjective  Laura Wilcox is a 88 y.o. female who presents for the following: Follow-up.  Patient presents for follow-up 5FU use for SCC of the left medial cheek. She has been using for 2 weeks and 6 days and has a good reaction. She also has biopsy proven SCC in situ of the right ulnar dorsal hand to be treated today.   Patient accompanied by daughter who contributes to history.   The following portions of the chart were reviewed this encounter and updated as appropriate:       Review of Systems:  No other skin or systemic complaints except as noted in HPI or Assessment and Plan.  Objective  Well appearing patient in no apparent distress; mood and affect are within normal limits.  A focused examination was performed including face, right hand. Relevant physical exam findings are noted in the Assessment and Plan.  Left Medial Cheek Erythema and crusting.  Right Ulnar Dorsal Hand Crusted biopsy site. No obvious residual lesion  R thenar hand x 3; R forearm x 1 (4) Keratotic papules.    Assessment & Plan  Squamous cell carcinoma in situ (2) Left Medial Cheek  Right Ulnar Dorsal Hand  Left medial cheek with good reaction to 5FU/vit D Cream. Continue 5FU BID/vit D x 1 more week.  Start 5FU/vit D Cream BID x 4 weeks to right thenar dorsal hand.   Directions faxed to [facility].   Reviewed course of treatment and expected reaction.  Patient advised to expect inflammation and crusting and advised that erosions are possible.  Patient advised to be diligent with sun protection during and after treatment. Handout with details of how to apply medication and what to expect provided. Counseled to keep medication out of reach of children and pets.   5-fluorouracil/calcipotriene cream is is a type of field treatment used to treat precancers, thin skin cancers, and areas of sun damage. Expected reaction includes irritation and mild inflammation potentially progressing  to more severe inflammation including redness, scaling, crusting and open sores/erosions.  If too much irritation occurs, ensure application of only a thin layer and decrease frequency of use to achieve a tolerable level of inflammation. Recommend applying Vaseline ointment to open sores as needed.  Minimize sun exposure while under treatment. Recommend daily broad spectrum sunscreen SPF 30+ to sun-exposed areas, reapply every 2 hours as needed.           Hypertrophic actinic keratosis (4) R thenar hand x 3; R forearm x 1  Actinic keratoses are precancerous spots that appear secondary to cumulative UV radiation exposure/sun exposure over time. They are chronic with expected duration over 1 year. A portion of actinic keratoses will progress to squamous cell carcinoma of the skin. It is not possible to reliably predict which spots will progress to skin cancer and so treatment is recommended to prevent development of skin cancer.  Recommend daily broad spectrum sunscreen SPF 30+ to sun-exposed areas, reapply every 2 hours as needed.  Recommend staying in the shade or wearing long sleeves, sun glasses (UVA+UVB protection) and wide brim hats (4-inch brim around the entire circumference of the hat). Call for new or changing lesions.  Destruction of lesion - R thenar hand x 3; R forearm x 1  Destruction method: cryotherapy   Informed consent: discussed and consent obtained   Lesion destroyed using liquid nitrogen: Yes   Region frozen until ice ball extended beyond lesion: Yes   Outcome: patient tolerated procedure well  with no complications   Post-procedure details: wound care instructions given   Additional details:  Prior to procedure, discussed risks of blister formation, small wound, skin dyspigmentation, or rare scar following cryotherapy. Recommend Vaseline ointment to treated areas while healing.   Actinic Damage - chronic, secondary to cumulative UV radiation exposure/sun exposure over  time - diffuse scaly erythematous macules with underlying dyspigmentation - Recommend daily broad spectrum sunscreen SPF 30+ to sun-exposed areas, reapply every 2 hours as needed.  - Recommend staying in the shade or wearing long sleeves, sun glasses (UVA+UVB protection) and wide brim hats (4-inch brim around the entire circumference of the hat). - Call for new or changing lesions.   History of Squamous Cell Carcinoma of the Skin - No evidence of recurrence today L cheek, L temple - Recommend regular full body skin exams - Recommend daily broad spectrum sunscreen SPF 30+ to sun-exposed areas, reapply every 2 hours as needed.  - Call if any new or changing lesions are noted between office visits    Return in about 4 weeks (around 04/15/2022) for f/u SCC insitu x 2.  IJamesetta Orleans, CMA, am acting as scribe for Brendolyn Patty, MD .  Documentation: I have reviewed the above documentation for accuracy and completeness, and I agree with the above.  Brendolyn Patty MD

## 2022-03-18 NOTE — Progress Notes (Signed)
    [facility] Consult Note Telephone: [phone]  Fax: [phone]    Date of encounter: 03/18/22 9:53 PM PATIENT NAME: Laura Wilcox [address] [place] [address]   [phone] (home)  DOB: 04-02-1926 MRN: [id number] PRIMARY CARE PROVIDER:   [facility] Glean Hess, MD,  [address] [place] [address] [phone]  RESPONSIBLE PARTY:    Contact Information     Name Relation Home Work Mobile   [place] Daughter   [phone]   KYANI, SIMKIN   [phone]   watts,[place] Relative   [phone]      I met face to face with patient in facility. Palliative Care was asked to follow this patient by consultation request of  Army Melia Jesse Sans, MD [facility] to address advance care planning and complex medical decision making. This is a follow up visit.                                  ASSESSMENT AND PLAN / RECOMMENDATIONS:  Symptom Management/Plan: 1. Advance Care Planning;  DNR 2. Goals of Care: Goals include to maximize quality of life and symptom management. Our advance care planning conversation included a discussion about:    The value and importance of advance care planning  Exploration of personal, cultural or spiritual beliefs that might influence medical decisions  Exploration of goals of care in the event of a sudden injury or illness  Identification and preparation of a healthcare agent  Review and updating or creation of an advance directive document. 3. Palliative care encounter; Palliative care encounter; Palliative medicine team will continue to support patient, patient's family, and medical team. Visit consisted of counseling and education dealing with the complex and emotionally intense issues of symptom management and palliative care in the setting of serious and potentially life-threatening illness  4. Dementia  5. Protein  calorie malnutrition Follow up Palliative Care Visit: Palliative care will continue to follow for complex medical decision making, advance care planning, and clarification of goals. Return 4 to 8  weeks or prn.  I spent 41 minutes providing this consultation. More than 50% of the time in this consultation was spent in counseling and care coordination. PPS: 50%  Chief Complaint: Follow up palliative consult for complex medical decision making, address goals, manage ongoing symptoms  HISTORY OF PRESENT ILLNESS:  Laura Wilcox is a 88 y.o. year old female  with multiple medical problems including dementia, protein calorie malnutrition, weakness, depression, CKD 3b, polycythemia vera, myelofibrosis, anemia of chronic disease, SCC of left cheek.   History obtained from review of EMR, discussion with primary team, and interview with family, facility staff/caregiver and/or Laura Wilcox.  I reviewed available labs, medications, imaging, studies and related documents from the EMR.  Records reviewed and summarized above.   ROS 10 point system reviewed all negative except HPI  Physical Exam: Constitutional: NAD General: frail appearing, elderly female ENMT: oral mucous membranes moist CV: S1S2, RRR Pulmonary: LCTA Abdomen: soft and non tender MSK: ambulatory Skin: warm and dry Neuro:  no generalized weakness,  +cognitive impairment Psych: non-anxious affect, A and oriented to self Thank you for the opportunity to participate in the care of Laura Wilcox. Please call our office at [phone] if we can be of additional assistance.   Leena Tiede Ihor Gully, NP

## 2022-03-18 NOTE — Patient Instructions (Addendum)
Continue fluorouracil 5% cream to left medial cheek twice a day x 1 more week. Start fluorouracil 5% cream to right thenar dorsal hand at biopsy site twice a day x 4 weeks. May use Vaseline to these areas.  5-fluorouracil/calcipotriene cream is is a type of field treatment used to treat precancers, thin skin cancers, and areas of sun damage. Expected reaction includes irritation and mild inflammation potentially progressing to more severe inflammation including redness, scaling, crusting and open sores/erosions.  If too much irritation occurs, ensure application of only a thin layer and decrease frequency of use to achieve a tolerable level of inflammation. Recommend applying Vaseline ointment to open sores as needed.  Minimize sun exposure while under treatment. Recommend daily broad spectrum sunscreen SPF 30+ to sun-exposed areas, reapply every 2 hours as needed.    Cryotherapy Aftercare  Wash gently with soap and water everyday.   Apply Vaseline and Band-Aid daily until healed.    Due to recent changes in healthcare laws, you may see results of your pathology and/or laboratory studies on MyChart before the doctors have had a chance to review them. We understand that in some cases there may be results that are confusing or concerning to you. Please understand that not all results are received at the same time and often the doctors may need to interpret multiple results in order to provide you with the best plan of care or course of treatment. Therefore, we ask that you please give [place] 2 business days to thoroughly review all your results before contacting the office for clarification. Should we see a critical lab result, you will be contacted sooner.   If You Need Anything After Your Visit  If you have any questions or concerns for your doctor, please call our main line at [phone] and press option 4 to reach your doctor's medical assistant. If no one answers, please leave a voicemail as  directed and we will return your call as soon as possible. Messages left after 4 pm will be answered the following business day.   You may also send [place] a message via [place]. We typically respond to MyChart messages within 1-2 business days.  For prescription refills, please ask your pharmacy to contact our office. Our fax number is [phone].  If you have an urgent issue when the clinic is closed that cannot wait until the next business day, you can page your doctor at the number below.    Please note that while we do our best to be available for urgent issues outside of office hours, we are not available 24/7.   If you have an urgent issue and are unable to reach [place], you may choose to seek medical care at your doctor's office, retail clinic, urgent care center, or emergency room.  If you have a medical emergency, please immediately call 911 or go to the emergency department.  Pager Numbers  - Dr. Nehemiah Massed: [phone]  - Dr. Laurence Ferrari: [phone]  - Dr. Nicole Kindred: [phone]  In the event of inclement weather, please call our main line at [phone] for an update on the status of any delays or closures.  Dermatology Medication Tips: Please keep the boxes that topical medications come in in order to help keep track of the instructions about where and how to use these. Pharmacies typically print the medication instructions only on the boxes and not directly on the medication tubes.   If your medication is too expensive, please contact our office at [phone] option 4 or  send [place] a message through [place].   We are unable to tell what your co-pay for medications will be in advance as this is different depending on your insurance coverage. However, we may be able to find a substitute medication at lower cost or fill out paperwork to get insurance to cover a needed medication.   If a prior authorization is required to get your medication covered by your insurance company, please  allow [place] 1-2 business days to complete this process.  Drug prices often vary depending on where the prescription is filled and some pharmacies may offer cheaper prices.  The website www.goodrx.com contains coupons for medications through different pharmacies. The prices here do not account for what the cost may be with help from insurance (it may be cheaper with your insurance), but the website can give you the price if you did not use any insurance.  - You can print the associated coupon and take it with your prescription to the pharmacy.  - You may also stop by our office during regular business hours and pick up a GoodRx coupon card.  - If you need your prescription sent electronically to a different pharmacy, notify our office through [facility] or by phone at [phone] option 4.     Si Usted Necesita Algo Despus de Su Visita  Tambin puede enviarnos un mensaje a travs de Pharmacist, community. Por lo general respondemos a los mensajes de MyChart en el transcurso de 1 a 2 das hbiles.  Para renovar recetas, por favor pida a su farmacia que se ponga en contacto con nuestra oficina. Harland Dingwall de fax es [place] [phone].  Si tiene un asunto urgente cuando la clnica est cerrada y que no puede esperar hasta el siguiente da hbil, puede llamar/localizar a su doctor(a) al nmero que aparece a continuacin.   Por favor, tenga en cuenta que aunque hacemos todo lo posible para estar disponibles para asuntos urgentes fuera del horario de [place], no estamos disponibles las 24 horas del da, los 7 das de la [place].   Si tiene un problema urgente y no puede comunicarse con nosotros, puede optar por buscar atencin mdica  en el consultorio de su doctor(a), en una clnica privada, en un centro de atencin urgente o en una sala de emergencias.  Si tiene Engineering geologist, por favor llame inmediatamente al 911 o vaya a la sala de emergencias.  Nmeros de bper  - Dr. Nehemiah Massed:  [phone]  - Dra. Moye: [phone]  - Dra. Nicole Kindred: [phone]  En caso de inclemencias del [place], por favor llame a Johnsie Kindred principal al [phone] para una actualizacin sobre el [place] de cualquier retraso o cierre.  Consejos para la medicacin en dermatologa: Por favor, guarde las cajas en las que vienen los medicamentos de uso tpico para ayudarle a seguir las instrucciones sobre dnde y cmo usarlos. Las farmacias generalmente imprimen las instrucciones del medicamento slo en las cajas y no directamente en los tubos del [place].   Si su medicamento es muy caro, por favor, pngase en contacto con Zigmund Daniel llamando al [phone] y presione la opcin 4 o envenos un mensaje a travs de Pharmacist, community.   No podemos decirle cul ser su copago por los medicamentos por adelantado ya que esto es diferente dependiendo de la cobertura de su seguro. Sin embargo, es posible que podamos encontrar un medicamento sustituto a Electrical engineer un formulario para que el seguro cubra el medicamento que se considera necesario.   Si  se requiere una autorizacin previa para que su compaa de seguros [place] su medicamento, por favor permtanos de 1 a 2 das hbiles para completar este proceso.  Los precios de los medicamentos varan con frecuencia dependiendo del Environmental consultant de dnde se surte la receta y alguna farmacias pueden ofrecer precios ms baratos.  El sitio web www.goodrx.com tiene cupones para medicamentos de Airline pilot. Los precios aqu no tienen en cuenta lo que podra costar con la ayuda del seguro (puede ser ms barato con su seguro), pero el sitio web puede darle el precio si no utiliz Research scientist (physical sciences).  - Puede imprimir el cupn correspondiente y llevarlo con su receta a la farmacia.  - Tambin puede pasar por nuestra oficina durante el horario de atencin regular y Charity fundraiser una tarjeta de cupones de GoodRx.  - Si necesita que su receta se enve electrnicamente a  una farmacia diferente, informe a nuestra oficina a travs de MyChart de [place] o por telfono llamando al [phone] y presione la opcin 4.

## 2022-03-19 DIAGNOSIS — N1832 Chronic kidney disease, stage 3b: Secondary | ICD-10-CM | POA: Diagnosis not present

## 2022-03-19 DIAGNOSIS — G301 Alzheimer's disease with late onset: Secondary | ICD-10-CM | POA: Diagnosis not present

## 2022-03-19 DIAGNOSIS — R296 Repeated falls: Secondary | ICD-10-CM | POA: Diagnosis not present

## 2022-03-19 DIAGNOSIS — R2681 Unsteadiness on feet: Secondary | ICD-10-CM | POA: Diagnosis not present

## 2022-03-19 DIAGNOSIS — M6281 Muscle weakness (generalized): Secondary | ICD-10-CM | POA: Diagnosis not present

## 2022-03-19 DIAGNOSIS — E039 Hypothyroidism, unspecified: Secondary | ICD-10-CM | POA: Diagnosis not present

## 2022-03-19 DIAGNOSIS — R278 Other lack of coordination: Secondary | ICD-10-CM | POA: Diagnosis not present

## 2022-03-19 DIAGNOSIS — C4492 Squamous cell carcinoma of skin, unspecified: Secondary | ICD-10-CM | POA: Insufficient documentation

## 2022-03-19 DIAGNOSIS — E785 Hyperlipidemia, unspecified: Secondary | ICD-10-CM | POA: Diagnosis not present

## 2022-03-20 DIAGNOSIS — G301 Alzheimer's disease with late onset: Secondary | ICD-10-CM | POA: Diagnosis not present

## 2022-03-20 DIAGNOSIS — J069 Acute upper respiratory infection, unspecified: Secondary | ICD-10-CM | POA: Diagnosis not present

## 2022-03-20 DIAGNOSIS — F02B0 Dementia in other diseases classified elsewhere, moderate, without behavioral disturbance, psychotic disturbance, mood disturbance, and anxiety: Secondary | ICD-10-CM | POA: Diagnosis not present

## 2022-03-21 DIAGNOSIS — R2681 Unsteadiness on feet: Secondary | ICD-10-CM | POA: Diagnosis not present

## 2022-03-21 DIAGNOSIS — N1832 Chronic kidney disease, stage 3b: Secondary | ICD-10-CM | POA: Diagnosis not present

## 2022-03-21 DIAGNOSIS — G301 Alzheimer's disease with late onset: Secondary | ICD-10-CM | POA: Diagnosis not present

## 2022-03-21 DIAGNOSIS — M6281 Muscle weakness (generalized): Secondary | ICD-10-CM | POA: Diagnosis not present

## 2022-03-21 DIAGNOSIS — R296 Repeated falls: Secondary | ICD-10-CM | POA: Diagnosis not present

## 2022-03-21 DIAGNOSIS — R278 Other lack of coordination: Secondary | ICD-10-CM | POA: Diagnosis not present

## 2022-03-27 DIAGNOSIS — R269 Unspecified abnormalities of gait and mobility: Secondary | ICD-10-CM | POA: Diagnosis not present

## 2022-03-27 DIAGNOSIS — R2681 Unsteadiness on feet: Secondary | ICD-10-CM | POA: Diagnosis not present

## 2022-03-27 DIAGNOSIS — C4339 Malignant melanoma of other parts of face: Secondary | ICD-10-CM | POA: Diagnosis not present

## 2022-03-27 DIAGNOSIS — G301 Alzheimer's disease with late onset: Secondary | ICD-10-CM | POA: Diagnosis not present

## 2022-03-27 DIAGNOSIS — M6281 Muscle weakness (generalized): Secondary | ICD-10-CM | POA: Diagnosis not present

## 2022-03-27 DIAGNOSIS — R531 Weakness: Secondary | ICD-10-CM | POA: Diagnosis not present

## 2022-03-27 DIAGNOSIS — R278 Other lack of coordination: Secondary | ICD-10-CM | POA: Diagnosis not present

## 2022-03-27 DIAGNOSIS — R296 Repeated falls: Secondary | ICD-10-CM | POA: Diagnosis not present

## 2022-03-27 DIAGNOSIS — N1832 Chronic kidney disease, stage 3b: Secondary | ICD-10-CM | POA: Diagnosis not present

## 2022-03-27 DIAGNOSIS — F331 Major depressive disorder, recurrent, moderate: Secondary | ICD-10-CM | POA: Diagnosis not present

## 2022-03-27 DIAGNOSIS — F02B0 Dementia in other diseases classified elsewhere, moderate, without behavioral disturbance, psychotic disturbance, mood disturbance, and anxiety: Secondary | ICD-10-CM | POA: Diagnosis not present

## 2022-03-28 ENCOUNTER — Other Ambulatory Visit (HOSPITAL_COMMUNITY): Payer: Self-pay

## 2022-03-28 DIAGNOSIS — R296 Repeated falls: Secondary | ICD-10-CM | POA: Diagnosis not present

## 2022-03-28 DIAGNOSIS — R2681 Unsteadiness on feet: Secondary | ICD-10-CM | POA: Diagnosis not present

## 2022-03-28 DIAGNOSIS — N1832 Chronic kidney disease, stage 3b: Secondary | ICD-10-CM | POA: Diagnosis not present

## 2022-03-28 DIAGNOSIS — M6281 Muscle weakness (generalized): Secondary | ICD-10-CM | POA: Diagnosis not present

## 2022-03-28 DIAGNOSIS — R278 Other lack of coordination: Secondary | ICD-10-CM | POA: Diagnosis not present

## 2022-03-28 DIAGNOSIS — G301 Alzheimer's disease with late onset: Secondary | ICD-10-CM | POA: Diagnosis not present

## 2022-04-02 ENCOUNTER — Other Ambulatory Visit (HOSPITAL_COMMUNITY): Payer: Self-pay

## 2022-04-02 ENCOUNTER — Inpatient Hospital Stay (HOSPITAL_BASED_OUTPATIENT_CLINIC_OR_DEPARTMENT_OTHER): Payer: Medicare Other | Admitting: Oncology

## 2022-04-02 ENCOUNTER — Inpatient Hospital Stay: Payer: Medicare Other | Attending: Oncology

## 2022-04-02 ENCOUNTER — Other Ambulatory Visit: Payer: Self-pay

## 2022-04-02 ENCOUNTER — Inpatient Hospital Stay: Payer: Medicare Other

## 2022-04-02 ENCOUNTER — Encounter: Payer: Self-pay | Admitting: Oncology

## 2022-04-02 VITALS — BP 131/69 | HR 89 | Temp 98.2°F | Resp 18 | Wt 122.6 lb

## 2022-04-02 DIAGNOSIS — D631 Anemia in chronic kidney disease: Secondary | ICD-10-CM | POA: Diagnosis not present

## 2022-04-02 DIAGNOSIS — N1832 Chronic kidney disease, stage 3b: Secondary | ICD-10-CM

## 2022-04-02 DIAGNOSIS — D45 Polycythemia vera: Secondary | ICD-10-CM

## 2022-04-02 DIAGNOSIS — F0390 Unspecified dementia without behavioral disturbance: Secondary | ICD-10-CM | POA: Diagnosis not present

## 2022-04-02 DIAGNOSIS — Z8249 Family history of ischemic heart disease and other diseases of the circulatory system: Secondary | ICD-10-CM | POA: Diagnosis not present

## 2022-04-02 DIAGNOSIS — Z881 Allergy status to other antibiotic agents status: Secondary | ICD-10-CM | POA: Diagnosis not present

## 2022-04-02 DIAGNOSIS — R5383 Other fatigue: Secondary | ICD-10-CM | POA: Diagnosis not present

## 2022-04-02 DIAGNOSIS — R4189 Other symptoms and signs involving cognitive functions and awareness: Secondary | ICD-10-CM | POA: Insufficient documentation

## 2022-04-02 DIAGNOSIS — Z87440 Personal history of urinary (tract) infections: Secondary | ICD-10-CM | POA: Diagnosis not present

## 2022-04-02 DIAGNOSIS — D7581 Myelofibrosis: Secondary | ICD-10-CM

## 2022-04-02 DIAGNOSIS — Z7982 Long term (current) use of aspirin: Secondary | ICD-10-CM | POA: Diagnosis not present

## 2022-04-02 DIAGNOSIS — Z79899 Other long term (current) drug therapy: Secondary | ICD-10-CM | POA: Insufficient documentation

## 2022-04-02 DIAGNOSIS — C44329 Squamous cell carcinoma of skin of other parts of face: Secondary | ICD-10-CM

## 2022-04-02 DIAGNOSIS — Z818 Family history of other mental and behavioral disorders: Secondary | ICD-10-CM | POA: Diagnosis not present

## 2022-04-02 DIAGNOSIS — Z803 Family history of malignant neoplasm of breast: Secondary | ICD-10-CM | POA: Insufficient documentation

## 2022-04-02 DIAGNOSIS — D471 Chronic myeloproliferative disease: Secondary | ICD-10-CM | POA: Diagnosis not present

## 2022-04-02 DIAGNOSIS — D75839 Thrombocytosis, unspecified: Secondary | ICD-10-CM | POA: Diagnosis not present

## 2022-04-02 DIAGNOSIS — Z85828 Personal history of other malignant neoplasm of skin: Secondary | ICD-10-CM | POA: Diagnosis not present

## 2022-04-02 DIAGNOSIS — R531 Weakness: Secondary | ICD-10-CM | POA: Diagnosis not present

## 2022-04-02 DIAGNOSIS — D709 Neutropenia, unspecified: Secondary | ICD-10-CM | POA: Insufficient documentation

## 2022-04-02 LAB — CBC WITH DIFFERENTIAL/PLATELET
Abs Immature Granulocytes: 0.48 10*3/uL — ABNORMAL HIGH (ref 0.00–0.07)
Basophils Absolute: 0.1 10*3/uL (ref 0.0–0.1)
Basophils Relative: 1 %
Eosinophils Absolute: 0.3 10*3/uL (ref 0.0–0.5)
Eosinophils Relative: 3 %
HCT: 27 % — ABNORMAL LOW (ref 36.0–46.0)
Hemoglobin: 8.7 g/dL — ABNORMAL LOW (ref 12.0–15.0)
Immature Granulocytes: 5 %
Lymphocytes Relative: 14 %
Lymphs Abs: 1.3 10*3/uL (ref 0.7–4.0)
MCH: 29.7 pg (ref 26.0–34.0)
MCHC: 32.2 g/dL (ref 30.0–36.0)
MCV: 92.2 fL (ref 80.0–100.0)
Monocytes Absolute: 1.1 10*3/uL — ABNORMAL HIGH (ref 0.1–1.0)
Monocytes Relative: 12 %
Neutro Abs: 6 10*3/uL (ref 1.7–7.7)
Neutrophils Relative %: 65 %
Platelets: 456 10*3/uL — ABNORMAL HIGH (ref 150–400)
RBC: 2.93 MIL/uL — ABNORMAL LOW (ref 3.87–5.11)
RDW: 22.5 % — ABNORMAL HIGH (ref 11.5–15.5)
WBC: 9.2 10*3/uL (ref 4.0–10.5)
nRBC: 0.9 % — ABNORMAL HIGH (ref 0.0–0.2)

## 2022-04-02 LAB — COMPREHENSIVE METABOLIC PANEL
ALT: 16 U/L (ref 0–44)
AST: 25 U/L (ref 15–41)
Albumin: 3.8 g/dL (ref 3.5–5.0)
Alkaline Phosphatase: 102 U/L (ref 38–126)
Anion gap: 14 (ref 5–15)
BUN: 25 mg/dL — ABNORMAL HIGH (ref 8–23)
CO2: 24 mmol/L (ref 22–32)
Calcium: 9.2 mg/dL (ref 8.9–10.3)
Chloride: 101 mmol/L (ref 98–111)
Creatinine, Ser: 1.5 mg/dL — ABNORMAL HIGH (ref 0.44–1.00)
GFR, Estimated: 32 mL/min — ABNORMAL LOW (ref >60)
Glucose, Bld: 124 mg/dL — ABNORMAL HIGH (ref 70–99)
Potassium: 3.6 mmol/L (ref 3.5–5.1)
Sodium: 139 mmol/L (ref 135–145)
Total Bilirubin: 0.4 mg/dL (ref 0.3–1.2)
Total Protein: 7 g/dL (ref 6.5–8.1)

## 2022-04-02 LAB — FERRITIN: Ferritin: 574 ng/mL — ABNORMAL HIGH (ref 11–307)

## 2022-04-02 LAB — IRON AND TIBC
Iron: 72 ug/dL (ref 28–170)
Saturation Ratios: 28 % (ref 10.4–31.8)
TIBC: 260 ug/dL (ref 250–450)
UIBC: 188 ug/dL

## 2022-04-02 MED ORDER — EPOETIN ALFA-EPBX 10000 UNIT/ML IJ SOLN
10000.0000 [IU] | Freq: Once | INTRAMUSCULAR | Status: AC
  Administered 2022-04-02: 10000 [IU] via SUBCUTANEOUS
  Filled 2022-04-02: qty 1

## 2022-04-02 MED ORDER — RUXOLITINIB PHOSPHATE 5 MG PO TABS
5.0000 mg | ORAL_TABLET | Freq: Two times a day (BID) | ORAL | 2 refills | Status: DC
  Filled 2022-04-02: qty 60, 30d supply, fill #0
  Filled 2022-04-29: qty 60, 30d supply, fill #1
  Filled 2022-05-30: qty 60, 30d supply, fill #2
  Filled 2022-06-26: qty 60, 30d supply, fill #3

## 2022-04-02 NOTE — Assessment & Plan Note (Signed)
S/p palliative RT. Follow up with dermatology

## 2022-04-02 NOTE — Assessment & Plan Note (Signed)
Anemia due to CKD, and myelofibrosis.   Hemoglobin has decreased, proceed with Retacrit 10,000 units.

## 2022-04-02 NOTE — Progress Notes (Signed)
Hematology/Oncology Progress note [phone] [phone]     ASSESSMENT & PLAN:   Myelofibrosis ([place]) Polycythemia vera and  secondary myelofibrosis. Hemoglobin is worse. Thrombocytosis has improved.  Decreased current regimen with Jakafi 5 mg in the morning, 5 mg in the evening.   Continue aspirin 81 mg daily.  Squamous cell cancer of skin of left cheek S/p palliative RT. Follow up with dermatology  Anemia in chronic kidney disease Anemia due to CKD, and myelofibrosis.   Hemoglobin has decreased, proceed with Retacrit 10,000 units.   Orders Placed This Encounter  Procedures   Iron and TIBC    Standing Status:   Future    Number of Occurrences:   1    Standing Expiration Date:   04/03/2023   Ferritin    Standing Status:   Future    Number of Occurrences:   1    Standing Expiration Date:   04/03/2023   CBC with Differential/Platelet    Standing Status:   Standing    Number of Occurrences:   2    Standing Expiration Date:   04/03/2023   Follow up  Labs in 4 weeks, 8 weeks -cbc +/- retacrit  Follow up in 12 weeks, lab prior to MD +/- retacrit.   All questions were answered. The patient knows to call the clinic with any problems, questions or concerns.  Laura Server, MD, PhD [facility] Hematology Oncology 04/02/2022      Chief Complaint: Laura Wilcox is a 89 y.o. female with polycythemia vera (PV) and secondary myelofibrosis presents for follow up   [place] is a 89 y.o.afemale who has above oncology history reviewed by me today presented for follow up visit for management of  polycythemia vera  Patient previously followed up by Laura Wilcox, patient switched care to me on 11/16/20 Extensive medical record review was performed by me  Feb 2014 polycythemia vera and secondary myelofibrosis.  She presented in 04/2002 with thrombocytosis (835,000).  JAK2 V617F was positive   Bone marrow was on 04/15/2002 revealed a  normocellular marrow for age with trilineage hematopoiesis and mild megakaryocytic hyperplasia with focal atypia.  There was no morphologic evidence of a myeloproliferative disorder.  Iron stains were inadequate for evaluation of storage iron.  There was no increase in reticulin fibers.  She was started on Agrylin in 2012  Bone marrow on 09/25/2012 revealed persistent myeloproliferative disorder s/p Agrylin.  The current features were best classified as persistent myeloproliferative neoplasm (MPN) JAK2 V617F mutation positive.  The current features of markedly increased atypical megakaryocytes paresis, with frequent clustering with frequent hyper/deeply lobulated forms, bizarre forms and forms with staghorn nuclei, hyperchromatic forms and abundant cytoplasm was highly suggestive of a myeloproliferative neoplasm.  Marrow was hypercellular for age (30-50%) with increased trilineage hematopoiesis and markedly increased atypical megakaryopoiesis with frequent clustering.  There were no increased blasts.  Iron stores were absent.  There was moderate to severe myelofibrosis (grade MF 2-3).  Differential includes ET, PV, primary myelofibrosis, and MPN, unclassifiable.  Cytogenetics were normal (26, XX). She developed pancytopenia in 12/2015.  Etiology is unclear (medication confusion or hydroxyurea + Agrylin).   She received Procrit 40,000 units (intermittently 09/2012 - 06/2016), Venofer (09/2012 - 10/2012), and Infed (12/2015 - 03/2016).  She received Neupogen during a period of neutropenia.   She was anagrelide from 2004 - 08/13/2017.  She began [place] on 08/14/2017.  Jakafi was increased from 5 mg BID to 10 mg BID on 06/17/2018.  Laura Wilcox was  increased to 15 mg BID on 08/03/2018.  She has been back on Jakafi 5 mg BID since 02/01/2019 then 10 mg BID on 05/25/2019.  Laura Wilcox was on hold from 12/20/2019 - 01/12/2020.  Jakafi 5 mg twice daily restarted on 01/12/2020 then increased to 10 mg in the morning and 5 mg in the  evening on 02/02/2020. Laura Wilcox is currently 20 mg po BID.  She has received Retacrit to maintain a hemoglobin > 10 (12/24/2019 and 03   Squamous cell carcinoma left cheek- s/p cryotherapy and debridement. Managed by Laura Wilcox  10/22/2020- 10/26/2020 Hospitalization due to acute metabolic encephalopathy, UTI,E. coli bacteremia AKI on CKD, acute on chronic anemia. Laura Wilcox was held during the admission. She was discharged and spent a short period of time in rehab. Laura Wilcox was held.   11/06/2020 seen by symptom management for weakness.  11/16/2020 established care with me. Resumed Jakafi at '10mg'$  BID  #Dementia/cognitive impairment- patient has hx of dementia. Per daughter, it has been difficult to convince patent to see neurology for further evaluation. She is not on any medication for her memory loss.  She has been referred to home health.  06/25/2021 - 07/02/2021,Patient was hospitalized due to UTI, generalized weakness Jakafi was.  Held. 07/25/2021, resumed on Jakafi 5 mg daily.  07/25/2021, bilateral lower extremity ultrasound were negative for DVT. 08/20/2021, increased Jakafi to 5 mg twice daily. INTERVAL HISTORY Laura Wilcox is a 89 y.o. female who has above history reviewed by me today presents for follow up visit for management of polycythemia vera and secondary myelofibrosis.   Patient was accompanied by daughter Laura Wilcox  + fatigue is worse.   Past Medical History:  Diagnosis Date   Acute metabolic encephalopathy 88/41/6606   Confusion 12/30/2018   Polycythemia    SCC (squamous cell carcinoma) 04/11/2021   R med dorsum hand - ED&C SCCIS   SCC (squamous cell carcinoma) 10/09/2021   left lateral eye, EDC   SCC (squamous cell carcinoma) 01/28/2022   left lateral eye, recurrent, EDC. 5FU/Calcipotriene cream.   SCC (squamous cell carcinoma) 01/28/2022   in situ, left medial cheek, 5FU/Calcipotriene Cr   SCC (squamous cell carcinoma) 02/26/2022   SCC IS, Right Ulnar Dorsal Hand,  5FU/Calcipotriene cream   Squamous cell carcinoma of skin 09/06/2020   R thumb - ED&C   Urinary tract infection without hematuria 01/02/2020   Urticaria 06/02/2017    Past Surgical History:  Procedure Laterality Date   ABDOMINAL HYSTERECTOMY     complete   BREAST SURGERY      Family History  Problem Relation Age of Onset   Heart disease Father    Cancer Sister        breast   Cancer Sister        breast   Alzheimer's disease Sister     Social History:  reports that she has never smoked. She has never used smokeless tobacco. She reports that she does not drink alcohol and does not use drugs.   Allergies:  Allergies  Allergen Reactions   Bactrim [Sulfamethoxazole-Trimethoprim]     Due to kidney disease    Epinephrine Other (See Comments)    Current Medications: Current Outpatient Medications  Medication Sig Dispense Refill   acetaminophen (TYLENOL) 325 MG tablet Take 325-650 mg by mouth every 6 (six) hours as needed for mild pain or moderate pain.     aspirin EC 81 MG tablet Take 81 mg by mouth daily. Swallow whole.     calcitRIOL (ROCALTROL) 0.25 MCG capsule  Take 0.25 mcg by mouth every Monday, Wednesday, and Friday.     citalopram (CELEXA) 10 MG tablet Take 1 tablet (10 mg total) by mouth daily. 30 tablet 2   feeding supplement (ENSURE ENLIVE / ENSURE PLUS) LIQD Take 237 mLs by mouth 2 (two) times daily between meals. 237 mL 12   polyethylene glycol (MIRALAX / GLYCOLAX) 17 g packet Take 17 g by mouth daily as needed for mild constipation. 14 each 0   VELTASSA 16.8 g PACK Take by mouth.     vitamin B-12 (CYANOCOBALAMIN) 1000 MCG tablet TAKE 1 TABLET BY MOUTH DAILY 30 tablet 1   ruxolitinib phosphate (JAKAFI) 5 MG tablet Take 1 tablet (5 mg total) by mouth 2 (two) times daily. 90 tablet 2   No current facility-administered medications for this visit.    Review of Systems  Unable to perform ROS: Dementia    Performance status (ECOG):  2  Vitals Blood pressure  131/69, pulse 89, temperature 98.2 F (36.8 C), resp. rate 18, weight 122 lb 9.6 oz (55.6 kg).   Physical Exam Vitals reviewed.  Constitutional:      General: She is not in acute distress.    Appearance: She is well-developed.     Comments: Frail appearance, patient sits in the wheelchair  HENT:     Head: Normocephalic.     Nose: Nose normal. No congestion.     Mouth/Throat:     Pharynx: No oropharyngeal exudate.  Eyes:     General: No scleral icterus.    Conjunctiva/sclera: Conjunctivae normal.  Cardiovascular:     Rate and Rhythm: Normal rate.     Pulses: Normal pulses.  Pulmonary:     Effort: Pulmonary effort is normal. No respiratory distress.  Abdominal:     General: There is no distension.     Tenderness: There is no abdominal tenderness.  Musculoskeletal:        General: No deformity. Normal range of motion.  Skin:    General: Skin is warm and dry.     Coloration: Skin is not pale.     Comments: Left cheek skin lesion  Neurological:     Mental Status: She is alert. Mental status is at baseline.  Psychiatric:        Attention and Perception: Attention normal.        Mood and Affect: Mood and affect normal.        Behavior: Behavior is cooperative.        Cognition and Memory: Cognition is impaired. Memory is impaired.    Labs    Latest Ref Rng & Units 04/02/2022    1:52 PM 01/29/2022    1:35 PM 12/17/2021    2:35 PM  CBC  WBC 4.0 - 10.5 K/uL 9.2  9.0  11.4   Hemoglobin 12.0 - 15.0 g/dL 8.7  10.5  10.8   Hematocrit 36.0 - 46.0 % 27.0  33.8  33.9   Platelets 150 - 400 K/uL 456  574  644       Latest Ref Rng & Units 04/02/2022    1:52 PM 01/29/2022    1:35 PM 12/17/2021    2:35 PM  CMP  Glucose 70 - 99 mg/dL 124  176  126   BUN 8 - 23 mg/dL 25  36  30   Creatinine 0.44 - 1.00 mg/dL 1.50  1.46  1.36   Sodium 135 - 145 mmol/L 139  135  138   Potassium 3.5 - 5.1 mmol/L  3.6  4.6  3.8   Chloride 98 - 111 mmol/L 101  101  104   CO2 22 - 32 mmol/L '24  26  27    '$ Calcium 8.9 - 10.3 mg/dL 9.2  9.1  8.9   Total Protein 6.5 - 8.1 g/dL 7.0  7.1  7.1   Total Bilirubin 0.3 - 1.2 mg/dL 0.4  0.4  0.3   Alkaline Phos 38 - 126 U/L 102  79  95   AST 15 - 41 U/L '25  21  17   '$ ALT 0 - 44 U/L 16  15  12

## 2022-04-02 NOTE — Assessment & Plan Note (Signed)
Polycythemia vera and  secondary myelofibrosis. Hemoglobin is worse. Thrombocytosis has improved.  Decreased current regimen with Jakafi 5 mg in the morning, 5 mg in the evening.   Continue aspirin 81 mg daily.

## 2022-04-03 ENCOUNTER — Telehealth: Payer: Self-pay

## 2022-04-03 DIAGNOSIS — D45 Polycythemia vera: Secondary | ICD-10-CM

## 2022-04-03 DIAGNOSIS — M6281 Muscle weakness (generalized): Secondary | ICD-10-CM | POA: Diagnosis not present

## 2022-04-03 DIAGNOSIS — R278 Other lack of coordination: Secondary | ICD-10-CM | POA: Diagnosis not present

## 2022-04-03 DIAGNOSIS — N1832 Chronic kidney disease, stage 3b: Secondary | ICD-10-CM | POA: Diagnosis not present

## 2022-04-03 DIAGNOSIS — R296 Repeated falls: Secondary | ICD-10-CM | POA: Diagnosis not present

## 2022-04-03 DIAGNOSIS — R2681 Unsteadiness on feet: Secondary | ICD-10-CM | POA: Diagnosis not present

## 2022-04-03 DIAGNOSIS — G301 Alzheimer's disease with late onset: Secondary | ICD-10-CM | POA: Diagnosis not present

## 2022-04-03 NOTE — Telephone Encounter (Signed)
-----   Message from Earlie Server, MD sent at 04/02/2022  9:48 PM EST ----- I updated her LOS with future follow-up plan in 12 weeks.

## 2022-04-03 NOTE — Telephone Encounter (Signed)
Dr. Tasia Catchings updated LOS:   Follow up in 12 weeks, lab prior to MD +/- retacrit.   Please schedule and inform pt's daughter of appt. Thanks

## 2022-04-04 DIAGNOSIS — M6281 Muscle weakness (generalized): Secondary | ICD-10-CM | POA: Diagnosis not present

## 2022-04-04 DIAGNOSIS — R278 Other lack of coordination: Secondary | ICD-10-CM | POA: Diagnosis not present

## 2022-04-04 DIAGNOSIS — N1832 Chronic kidney disease, stage 3b: Secondary | ICD-10-CM | POA: Diagnosis not present

## 2022-04-04 DIAGNOSIS — R296 Repeated falls: Secondary | ICD-10-CM | POA: Diagnosis not present

## 2022-04-04 DIAGNOSIS — G301 Alzheimer's disease with late onset: Secondary | ICD-10-CM | POA: Diagnosis not present

## 2022-04-04 DIAGNOSIS — R2681 Unsteadiness on feet: Secondary | ICD-10-CM | POA: Diagnosis not present

## 2022-04-05 ENCOUNTER — Other Ambulatory Visit (HOSPITAL_COMMUNITY): Payer: Self-pay

## 2022-04-05 DIAGNOSIS — R296 Repeated falls: Secondary | ICD-10-CM | POA: Diagnosis not present

## 2022-04-05 DIAGNOSIS — M6281 Muscle weakness (generalized): Secondary | ICD-10-CM | POA: Diagnosis not present

## 2022-04-05 DIAGNOSIS — N1832 Chronic kidney disease, stage 3b: Secondary | ICD-10-CM | POA: Diagnosis not present

## 2022-04-05 DIAGNOSIS — R278 Other lack of coordination: Secondary | ICD-10-CM | POA: Diagnosis not present

## 2022-04-05 DIAGNOSIS — G301 Alzheimer's disease with late onset: Secondary | ICD-10-CM | POA: Diagnosis not present

## 2022-04-05 DIAGNOSIS — R2681 Unsteadiness on feet: Secondary | ICD-10-CM | POA: Diagnosis not present

## 2022-04-08 ENCOUNTER — Ambulatory Visit: Payer: PRIVATE HEALTH INSURANCE | Admitting: Dermatology

## 2022-04-08 DIAGNOSIS — G301 Alzheimer's disease with late onset: Secondary | ICD-10-CM | POA: Diagnosis not present

## 2022-04-08 DIAGNOSIS — R278 Other lack of coordination: Secondary | ICD-10-CM | POA: Diagnosis not present

## 2022-04-08 DIAGNOSIS — R2681 Unsteadiness on feet: Secondary | ICD-10-CM | POA: Diagnosis not present

## 2022-04-08 DIAGNOSIS — R296 Repeated falls: Secondary | ICD-10-CM | POA: Diagnosis not present

## 2022-04-08 DIAGNOSIS — M6281 Muscle weakness (generalized): Secondary | ICD-10-CM | POA: Diagnosis not present

## 2022-04-08 DIAGNOSIS — N1832 Chronic kidney disease, stage 3b: Secondary | ICD-10-CM | POA: Diagnosis not present

## 2022-04-10 DIAGNOSIS — R2681 Unsteadiness on feet: Secondary | ICD-10-CM | POA: Diagnosis not present

## 2022-04-10 DIAGNOSIS — R278 Other lack of coordination: Secondary | ICD-10-CM | POA: Diagnosis not present

## 2022-04-10 DIAGNOSIS — N1832 Chronic kidney disease, stage 3b: Secondary | ICD-10-CM | POA: Diagnosis not present

## 2022-04-10 DIAGNOSIS — R296 Repeated falls: Secondary | ICD-10-CM | POA: Diagnosis not present

## 2022-04-10 DIAGNOSIS — M6281 Muscle weakness (generalized): Secondary | ICD-10-CM | POA: Diagnosis not present

## 2022-04-10 DIAGNOSIS — G301 Alzheimer's disease with late onset: Secondary | ICD-10-CM | POA: Diagnosis not present

## 2022-04-11 DIAGNOSIS — M6281 Muscle weakness (generalized): Secondary | ICD-10-CM | POA: Diagnosis not present

## 2022-04-11 DIAGNOSIS — R609 Edema, unspecified: Secondary | ICD-10-CM | POA: Diagnosis not present

## 2022-04-11 DIAGNOSIS — R278 Other lack of coordination: Secondary | ICD-10-CM | POA: Diagnosis not present

## 2022-04-11 DIAGNOSIS — G301 Alzheimer's disease with late onset: Secondary | ICD-10-CM | POA: Diagnosis not present

## 2022-04-11 DIAGNOSIS — R2681 Unsteadiness on feet: Secondary | ICD-10-CM | POA: Diagnosis not present

## 2022-04-11 DIAGNOSIS — S8012XA Contusion of left lower leg, initial encounter: Secondary | ICD-10-CM | POA: Diagnosis not present

## 2022-04-11 DIAGNOSIS — M7981 Nontraumatic hematoma of soft tissue: Secondary | ICD-10-CM | POA: Diagnosis not present

## 2022-04-11 DIAGNOSIS — N1832 Chronic kidney disease, stage 3b: Secondary | ICD-10-CM | POA: Diagnosis not present

## 2022-04-11 DIAGNOSIS — R296 Repeated falls: Secondary | ICD-10-CM | POA: Diagnosis not present

## 2022-04-12 DIAGNOSIS — R600 Localized edema: Secondary | ICD-10-CM | POA: Diagnosis not present

## 2022-04-15 DIAGNOSIS — R278 Other lack of coordination: Secondary | ICD-10-CM | POA: Diagnosis not present

## 2022-04-15 DIAGNOSIS — R296 Repeated falls: Secondary | ICD-10-CM | POA: Diagnosis not present

## 2022-04-15 DIAGNOSIS — M6281 Muscle weakness (generalized): Secondary | ICD-10-CM | POA: Diagnosis not present

## 2022-04-15 DIAGNOSIS — N1832 Chronic kidney disease, stage 3b: Secondary | ICD-10-CM | POA: Diagnosis not present

## 2022-04-15 DIAGNOSIS — R2681 Unsteadiness on feet: Secondary | ICD-10-CM | POA: Diagnosis not present

## 2022-04-15 DIAGNOSIS — G301 Alzheimer's disease with late onset: Secondary | ICD-10-CM | POA: Diagnosis not present

## 2022-04-16 ENCOUNTER — Ambulatory Visit: Payer: PRIVATE HEALTH INSURANCE | Admitting: Dermatology

## 2022-04-17 ENCOUNTER — Ambulatory Visit: Payer: PRIVATE HEALTH INSURANCE | Admitting: Dermatology

## 2022-04-17 DIAGNOSIS — S8012XD Contusion of left lower leg, subsequent encounter: Secondary | ICD-10-CM | POA: Diagnosis not present

## 2022-04-17 DIAGNOSIS — G301 Alzheimer's disease with late onset: Secondary | ICD-10-CM | POA: Diagnosis not present

## 2022-04-17 DIAGNOSIS — R296 Repeated falls: Secondary | ICD-10-CM | POA: Diagnosis not present

## 2022-04-17 DIAGNOSIS — R609 Edema, unspecified: Secondary | ICD-10-CM | POA: Diagnosis not present

## 2022-04-17 DIAGNOSIS — M6281 Muscle weakness (generalized): Secondary | ICD-10-CM | POA: Diagnosis not present

## 2022-04-17 DIAGNOSIS — F02B0 Dementia in other diseases classified elsewhere, moderate, without behavioral disturbance, psychotic disturbance, mood disturbance, and anxiety: Secondary | ICD-10-CM | POA: Diagnosis not present

## 2022-04-17 DIAGNOSIS — N1832 Chronic kidney disease, stage 3b: Secondary | ICD-10-CM | POA: Diagnosis not present

## 2022-04-17 DIAGNOSIS — R278 Other lack of coordination: Secondary | ICD-10-CM | POA: Diagnosis not present

## 2022-04-17 DIAGNOSIS — R2681 Unsteadiness on feet: Secondary | ICD-10-CM | POA: Diagnosis not present

## 2022-04-17 DIAGNOSIS — M7981 Nontraumatic hematoma of soft tissue: Secondary | ICD-10-CM | POA: Diagnosis not present

## 2022-04-18 DIAGNOSIS — N1832 Chronic kidney disease, stage 3b: Secondary | ICD-10-CM | POA: Diagnosis not present

## 2022-04-18 DIAGNOSIS — R278 Other lack of coordination: Secondary | ICD-10-CM | POA: Diagnosis not present

## 2022-04-18 DIAGNOSIS — R296 Repeated falls: Secondary | ICD-10-CM | POA: Diagnosis not present

## 2022-04-18 DIAGNOSIS — M6281 Muscle weakness (generalized): Secondary | ICD-10-CM | POA: Diagnosis not present

## 2022-04-18 DIAGNOSIS — R2681 Unsteadiness on feet: Secondary | ICD-10-CM | POA: Diagnosis not present

## 2022-04-18 DIAGNOSIS — G301 Alzheimer's disease with late onset: Secondary | ICD-10-CM | POA: Diagnosis not present

## 2022-04-22 DIAGNOSIS — N1832 Chronic kidney disease, stage 3b: Secondary | ICD-10-CM | POA: Diagnosis not present

## 2022-04-22 DIAGNOSIS — F330 Major depressive disorder, recurrent, mild: Secondary | ICD-10-CM | POA: Diagnosis not present

## 2022-04-22 DIAGNOSIS — R2681 Unsteadiness on feet: Secondary | ICD-10-CM | POA: Diagnosis not present

## 2022-04-22 DIAGNOSIS — R278 Other lack of coordination: Secondary | ICD-10-CM | POA: Diagnosis not present

## 2022-04-22 DIAGNOSIS — R296 Repeated falls: Secondary | ICD-10-CM | POA: Diagnosis not present

## 2022-04-22 DIAGNOSIS — G301 Alzheimer's disease with late onset: Secondary | ICD-10-CM | POA: Diagnosis not present

## 2022-04-22 DIAGNOSIS — M6281 Muscle weakness (generalized): Secondary | ICD-10-CM | POA: Diagnosis not present

## 2022-04-23 DIAGNOSIS — M6281 Muscle weakness (generalized): Secondary | ICD-10-CM | POA: Diagnosis not present

## 2022-04-23 DIAGNOSIS — R296 Repeated falls: Secondary | ICD-10-CM | POA: Diagnosis not present

## 2022-04-23 DIAGNOSIS — N1832 Chronic kidney disease, stage 3b: Secondary | ICD-10-CM | POA: Diagnosis not present

## 2022-04-23 DIAGNOSIS — R278 Other lack of coordination: Secondary | ICD-10-CM | POA: Diagnosis not present

## 2022-04-23 DIAGNOSIS — G301 Alzheimer's disease with late onset: Secondary | ICD-10-CM | POA: Diagnosis not present

## 2022-04-23 DIAGNOSIS — R2681 Unsteadiness on feet: Secondary | ICD-10-CM | POA: Diagnosis not present

## 2022-04-24 DIAGNOSIS — M6281 Muscle weakness (generalized): Secondary | ICD-10-CM | POA: Diagnosis not present

## 2022-04-24 DIAGNOSIS — D45 Polycythemia vera: Secondary | ICD-10-CM | POA: Diagnosis not present

## 2022-04-24 DIAGNOSIS — E875 Hyperkalemia: Secondary | ICD-10-CM | POA: Diagnosis not present

## 2022-04-24 DIAGNOSIS — R296 Repeated falls: Secondary | ICD-10-CM | POA: Diagnosis not present

## 2022-04-24 DIAGNOSIS — R2681 Unsteadiness on feet: Secondary | ICD-10-CM | POA: Diagnosis not present

## 2022-04-24 DIAGNOSIS — G301 Alzheimer's disease with late onset: Secondary | ICD-10-CM | POA: Diagnosis not present

## 2022-04-24 DIAGNOSIS — F02B0 Dementia in other diseases classified elsewhere, moderate, without behavioral disturbance, psychotic disturbance, mood disturbance, and anxiety: Secondary | ICD-10-CM | POA: Diagnosis not present

## 2022-04-24 DIAGNOSIS — R269 Unspecified abnormalities of gait and mobility: Secondary | ICD-10-CM | POA: Diagnosis not present

## 2022-04-24 DIAGNOSIS — F330 Major depressive disorder, recurrent, mild: Secondary | ICD-10-CM | POA: Diagnosis not present

## 2022-04-24 DIAGNOSIS — N1832 Chronic kidney disease, stage 3b: Secondary | ICD-10-CM | POA: Diagnosis not present

## 2022-04-24 DIAGNOSIS — R278 Other lack of coordination: Secondary | ICD-10-CM | POA: Diagnosis not present

## 2022-04-26 DIAGNOSIS — M6281 Muscle weakness (generalized): Secondary | ICD-10-CM | POA: Diagnosis not present

## 2022-04-26 DIAGNOSIS — R278 Other lack of coordination: Secondary | ICD-10-CM | POA: Diagnosis not present

## 2022-04-26 DIAGNOSIS — R2681 Unsteadiness on feet: Secondary | ICD-10-CM | POA: Diagnosis not present

## 2022-04-26 DIAGNOSIS — N1832 Chronic kidney disease, stage 3b: Secondary | ICD-10-CM | POA: Diagnosis not present

## 2022-04-26 DIAGNOSIS — R296 Repeated falls: Secondary | ICD-10-CM | POA: Diagnosis not present

## 2022-04-26 DIAGNOSIS — G301 Alzheimer's disease with late onset: Secondary | ICD-10-CM | POA: Diagnosis not present

## 2022-04-29 ENCOUNTER — Other Ambulatory Visit (HOSPITAL_COMMUNITY): Payer: Self-pay

## 2022-04-29 DIAGNOSIS — R2681 Unsteadiness on feet: Secondary | ICD-10-CM | POA: Diagnosis not present

## 2022-04-29 DIAGNOSIS — N1832 Chronic kidney disease, stage 3b: Secondary | ICD-10-CM | POA: Diagnosis not present

## 2022-04-29 DIAGNOSIS — M6281 Muscle weakness (generalized): Secondary | ICD-10-CM | POA: Diagnosis not present

## 2022-04-29 DIAGNOSIS — R278 Other lack of coordination: Secondary | ICD-10-CM | POA: Diagnosis not present

## 2022-04-29 DIAGNOSIS — R296 Repeated falls: Secondary | ICD-10-CM | POA: Diagnosis not present

## 2022-04-29 DIAGNOSIS — G301 Alzheimer's disease with late onset: Secondary | ICD-10-CM | POA: Diagnosis not present

## 2022-04-30 ENCOUNTER — Other Ambulatory Visit (HOSPITAL_COMMUNITY): Payer: Self-pay

## 2022-04-30 ENCOUNTER — Inpatient Hospital Stay: Payer: Medicare Other

## 2022-04-30 DIAGNOSIS — G301 Alzheimer's disease with late onset: Secondary | ICD-10-CM | POA: Diagnosis not present

## 2022-04-30 DIAGNOSIS — R278 Other lack of coordination: Secondary | ICD-10-CM | POA: Diagnosis not present

## 2022-04-30 DIAGNOSIS — R296 Repeated falls: Secondary | ICD-10-CM | POA: Diagnosis not present

## 2022-04-30 DIAGNOSIS — R2681 Unsteadiness on feet: Secondary | ICD-10-CM | POA: Diagnosis not present

## 2022-04-30 DIAGNOSIS — M6281 Muscle weakness (generalized): Secondary | ICD-10-CM | POA: Diagnosis not present

## 2022-04-30 DIAGNOSIS — N1832 Chronic kidney disease, stage 3b: Secondary | ICD-10-CM | POA: Diagnosis not present

## 2022-05-01 ENCOUNTER — Other Ambulatory Visit (HOSPITAL_COMMUNITY): Payer: Self-pay

## 2022-05-02 DIAGNOSIS — R300 Dysuria: Secondary | ICD-10-CM | POA: Diagnosis not present

## 2022-05-03 DIAGNOSIS — R278 Other lack of coordination: Secondary | ICD-10-CM | POA: Diagnosis not present

## 2022-05-03 DIAGNOSIS — N1832 Chronic kidney disease, stage 3b: Secondary | ICD-10-CM | POA: Diagnosis not present

## 2022-05-03 DIAGNOSIS — R296 Repeated falls: Secondary | ICD-10-CM | POA: Diagnosis not present

## 2022-05-03 DIAGNOSIS — R2681 Unsteadiness on feet: Secondary | ICD-10-CM | POA: Diagnosis not present

## 2022-05-03 DIAGNOSIS — M6281 Muscle weakness (generalized): Secondary | ICD-10-CM | POA: Diagnosis not present

## 2022-05-03 DIAGNOSIS — G301 Alzheimer's disease with late onset: Secondary | ICD-10-CM | POA: Diagnosis not present

## 2022-05-06 DIAGNOSIS — N1832 Chronic kidney disease, stage 3b: Secondary | ICD-10-CM | POA: Diagnosis not present

## 2022-05-07 DIAGNOSIS — G301 Alzheimer's disease with late onset: Secondary | ICD-10-CM | POA: Diagnosis not present

## 2022-05-07 DIAGNOSIS — D631 Anemia in chronic kidney disease: Secondary | ICD-10-CM | POA: Diagnosis not present

## 2022-05-07 DIAGNOSIS — R809 Proteinuria, unspecified: Secondary | ICD-10-CM | POA: Diagnosis not present

## 2022-05-07 DIAGNOSIS — N1832 Chronic kidney disease, stage 3b: Secondary | ICD-10-CM | POA: Diagnosis not present

## 2022-05-07 DIAGNOSIS — R2681 Unsteadiness on feet: Secondary | ICD-10-CM | POA: Diagnosis not present

## 2022-05-07 DIAGNOSIS — R278 Other lack of coordination: Secondary | ICD-10-CM | POA: Diagnosis not present

## 2022-05-07 DIAGNOSIS — D45 Polycythemia vera: Secondary | ICD-10-CM | POA: Diagnosis not present

## 2022-05-07 DIAGNOSIS — N2581 Secondary hyperparathyroidism of renal origin: Secondary | ICD-10-CM | POA: Diagnosis not present

## 2022-05-07 DIAGNOSIS — M6281 Muscle weakness (generalized): Secondary | ICD-10-CM | POA: Diagnosis not present

## 2022-05-07 DIAGNOSIS — R296 Repeated falls: Secondary | ICD-10-CM | POA: Diagnosis not present

## 2022-05-08 DIAGNOSIS — R278 Other lack of coordination: Secondary | ICD-10-CM | POA: Diagnosis not present

## 2022-05-08 DIAGNOSIS — R2681 Unsteadiness on feet: Secondary | ICD-10-CM | POA: Diagnosis not present

## 2022-05-08 DIAGNOSIS — G301 Alzheimer's disease with late onset: Secondary | ICD-10-CM | POA: Diagnosis not present

## 2022-05-08 DIAGNOSIS — N1832 Chronic kidney disease, stage 3b: Secondary | ICD-10-CM | POA: Diagnosis not present

## 2022-05-08 DIAGNOSIS — M6281 Muscle weakness (generalized): Secondary | ICD-10-CM | POA: Diagnosis not present

## 2022-05-08 DIAGNOSIS — R296 Repeated falls: Secondary | ICD-10-CM | POA: Diagnosis not present

## 2022-05-10 DIAGNOSIS — G301 Alzheimer's disease with late onset: Secondary | ICD-10-CM | POA: Diagnosis not present

## 2022-05-10 DIAGNOSIS — R296 Repeated falls: Secondary | ICD-10-CM | POA: Diagnosis not present

## 2022-05-10 DIAGNOSIS — R2681 Unsteadiness on feet: Secondary | ICD-10-CM | POA: Diagnosis not present

## 2022-05-10 DIAGNOSIS — M6281 Muscle weakness (generalized): Secondary | ICD-10-CM | POA: Diagnosis not present

## 2022-05-10 DIAGNOSIS — R278 Other lack of coordination: Secondary | ICD-10-CM | POA: Diagnosis not present

## 2022-05-10 DIAGNOSIS — N1832 Chronic kidney disease, stage 3b: Secondary | ICD-10-CM | POA: Diagnosis not present

## 2022-05-13 DIAGNOSIS — R278 Other lack of coordination: Secondary | ICD-10-CM | POA: Diagnosis not present

## 2022-05-13 DIAGNOSIS — R2681 Unsteadiness on feet: Secondary | ICD-10-CM | POA: Diagnosis not present

## 2022-05-13 DIAGNOSIS — N1832 Chronic kidney disease, stage 3b: Secondary | ICD-10-CM | POA: Diagnosis not present

## 2022-05-13 DIAGNOSIS — G301 Alzheimer's disease with late onset: Secondary | ICD-10-CM | POA: Diagnosis not present

## 2022-05-13 DIAGNOSIS — M6281 Muscle weakness (generalized): Secondary | ICD-10-CM | POA: Diagnosis not present

## 2022-05-13 DIAGNOSIS — R296 Repeated falls: Secondary | ICD-10-CM | POA: Diagnosis not present

## 2022-05-15 ENCOUNTER — Telehealth: Payer: Self-pay | Admitting: *Deleted

## 2022-05-15 ENCOUNTER — Telehealth: Payer: Self-pay

## 2022-05-15 DIAGNOSIS — M6281 Muscle weakness (generalized): Secondary | ICD-10-CM | POA: Diagnosis not present

## 2022-05-15 DIAGNOSIS — N1832 Chronic kidney disease, stage 3b: Secondary | ICD-10-CM | POA: Diagnosis not present

## 2022-05-15 DIAGNOSIS — R2681 Unsteadiness on feet: Secondary | ICD-10-CM | POA: Diagnosis not present

## 2022-05-15 DIAGNOSIS — G301 Alzheimer's disease with late onset: Secondary | ICD-10-CM | POA: Diagnosis not present

## 2022-05-15 DIAGNOSIS — R278 Other lack of coordination: Secondary | ICD-10-CM | POA: Diagnosis not present

## 2022-05-15 DIAGNOSIS — R296 Repeated falls: Secondary | ICD-10-CM | POA: Diagnosis not present

## 2022-05-15 NOTE — Telephone Encounter (Signed)
Called and spoke to [place] and informed her that appt will be set up for this week and that other appts will be adjusted to be 4 weeks apart. She verbalized understanding and will wait for call with new appts.   Please contact [name] once appts has been scheduled/ rescheduled:   Lab/ retacit this week (cbc) r/s  appts in march/ april to be 4 weeks from this week's inj (labs prior to MD/ retacrit)

## 2022-05-15 NOTE — Telephone Encounter (Signed)
Per daughter [name] Dr Holley Raring wanted her to call to get patient in here due to the fact that patient hgb has dropped to 9 and she needs an injection. Please advise.  Her next appointment is April 2to see you, labs a few days prior

## 2022-05-15 NOTE — Telephone Encounter (Signed)
spoke to pt's daughter and she states that Laura Wilcox started her on Celexa back in March 2023 because of her behavior/ cognitive issues. She noted improvement at first, but pt is starting to show that behavior again. She does not want to get out of bed and refuses to eat at times. She is asking to see if dosage may need to be adjusted. She says she can bring pt in to be seen if needed. Please advise.

## 2022-05-17 ENCOUNTER — Inpatient Hospital Stay: Payer: Medicare Other | Attending: Oncology

## 2022-05-17 ENCOUNTER — Other Ambulatory Visit: Payer: Self-pay

## 2022-05-17 ENCOUNTER — Other Ambulatory Visit: Payer: Self-pay | Admitting: *Deleted

## 2022-05-17 ENCOUNTER — Telehealth: Payer: Self-pay | Admitting: *Deleted

## 2022-05-17 ENCOUNTER — Inpatient Hospital Stay (HOSPITAL_BASED_OUTPATIENT_CLINIC_OR_DEPARTMENT_OTHER): Payer: Medicare Other | Admitting: Hospice and Palliative Medicine

## 2022-05-17 ENCOUNTER — Encounter: Payer: Self-pay | Admitting: Hospice and Palliative Medicine

## 2022-05-17 ENCOUNTER — Inpatient Hospital Stay: Payer: Medicare Other

## 2022-05-17 VITALS — BP 101/63 | HR 84 | Temp 96.7°F | Resp 18 | Ht 60.0 in | Wt 126.0 lb

## 2022-05-17 DIAGNOSIS — D45 Polycythemia vera: Secondary | ICD-10-CM | POA: Insufficient documentation

## 2022-05-17 DIAGNOSIS — D631 Anemia in chronic kidney disease: Secondary | ICD-10-CM

## 2022-05-17 DIAGNOSIS — N1832 Chronic kidney disease, stage 3b: Secondary | ICD-10-CM | POA: Diagnosis not present

## 2022-05-17 DIAGNOSIS — R531 Weakness: Secondary | ICD-10-CM | POA: Diagnosis not present

## 2022-05-17 DIAGNOSIS — R2681 Unsteadiness on feet: Secondary | ICD-10-CM | POA: Diagnosis not present

## 2022-05-17 DIAGNOSIS — R410 Disorientation, unspecified: Secondary | ICD-10-CM | POA: Insufficient documentation

## 2022-05-17 DIAGNOSIS — G301 Alzheimer's disease with late onset: Secondary | ICD-10-CM | POA: Diagnosis not present

## 2022-05-17 DIAGNOSIS — Z79899 Other long term (current) drug therapy: Secondary | ICD-10-CM | POA: Insufficient documentation

## 2022-05-17 DIAGNOSIS — R296 Repeated falls: Secondary | ICD-10-CM | POA: Diagnosis not present

## 2022-05-17 DIAGNOSIS — R278 Other lack of coordination: Secondary | ICD-10-CM | POA: Diagnosis not present

## 2022-05-17 DIAGNOSIS — M6281 Muscle weakness (generalized): Secondary | ICD-10-CM | POA: Diagnosis not present

## 2022-05-17 LAB — URINALYSIS, COMPLETE (UACMP) WITH MICROSCOPIC
Bilirubin Urine: NEGATIVE
Glucose, UA: NEGATIVE mg/dL
Hgb urine dipstick: NEGATIVE
Ketones, ur: 5 mg/dL — AB
Nitrite: NEGATIVE
Protein, ur: 30 mg/dL — AB
Specific Gravity, Urine: 1.021 (ref 1.005–1.030)
pH: 5 (ref 5.0–8.0)

## 2022-05-17 LAB — CBC WITH DIFFERENTIAL/PLATELET
Abs Immature Granulocytes: 1.05 10*3/uL — ABNORMAL HIGH (ref 0.00–0.07)
Basophils Absolute: 0.2 10*3/uL — ABNORMAL HIGH (ref 0.0–0.1)
Basophils Relative: 2 %
Eosinophils Absolute: 0.5 10*3/uL (ref 0.0–0.5)
Eosinophils Relative: 4 %
HCT: 29.9 % — ABNORMAL LOW (ref 36.0–46.0)
Hemoglobin: 9.1 g/dL — ABNORMAL LOW (ref 12.0–15.0)
Immature Granulocytes: 9 %
Lymphocytes Relative: 16 %
Lymphs Abs: 1.9 10*3/uL (ref 0.7–4.0)
MCH: 29.7 pg (ref 26.0–34.0)
MCHC: 30.4 g/dL (ref 30.0–36.0)
MCV: 97.7 fL (ref 80.0–100.0)
Monocytes Absolute: 1.5 10*3/uL — ABNORMAL HIGH (ref 0.1–1.0)
Monocytes Relative: 13 %
Neutro Abs: 6.6 10*3/uL (ref 1.7–7.7)
Neutrophils Relative %: 56 %
Platelets: 781 10*3/uL — ABNORMAL HIGH (ref 150–400)
RBC: 3.06 MIL/uL — ABNORMAL LOW (ref 3.87–5.11)
RDW: 22.8 % — ABNORMAL HIGH (ref 11.5–15.5)
Smear Review: INCREASED
WBC: 11.8 10*3/uL — ABNORMAL HIGH (ref 4.0–10.5)
nRBC: 0.4 % — ABNORMAL HIGH (ref 0.0–0.2)

## 2022-05-17 MED ORDER — EPOETIN ALFA-EPBX 10000 UNIT/ML IJ SOLN
10000.0000 [IU] | Freq: Once | INTRAMUSCULAR | Status: AC
  Administered 2022-05-17: 10000 [IU] via SUBCUTANEOUS
  Filled 2022-05-17: qty 1

## 2022-05-17 MED ORDER — CITALOPRAM HYDROBROMIDE 20 MG PO TABS: 20.0000 mg | ORAL_TABLET | Freq: Every day | ORAL | 3 refills | Status: DC

## 2022-05-17 NOTE — Progress Notes (Signed)
[place] at [facility] [phone] [phone]   Name: Laura Wilcox Date: 05/17/2022 MRN: [id number]  DOB: [birth year]  Patient Care Team: Glean Hess, MD as PCP - General (Internal Medicine) Anthonette Legato, MD (Internal Medicine) Glean Hess, MD (Internal Medicine)    REASON FOR CONSULTATION: Laura Wilcox is a 89 y.o. female with multiple medical problems including memory impairment/probable dementia and polycythemia vera and secondary myelofibrosis on Jakafi.  Patient was referred to palliative care to help address goals in the context of worsening confusion/memory issues, anxiety, and agitation.  SOCIAL HISTORY:     reports that she has never smoked. She has never used smokeless tobacco. She reports that she does not drink alcohol and does not use drugs.  Patient is widowed.  She has a daughter who is her primary caregiver.  She has a son who lives in [place]. Patient is a resident at [facility].   ADVANCE DIRECTIVES:  None on file  CODE STATUS: DNR  PAST MEDICAL HISTORY: Past Medical History:  Diagnosis Date   Acute metabolic encephalopathy Q000111Q   Confusion 12/30/2018   Polycythemia    SCC (squamous cell carcinoma) 04/11/2021   R med dorsum hand - ED&C SCCIS   SCC (squamous cell carcinoma) 10/09/2021   left lateral eye, EDC   SCC (squamous cell carcinoma) 01/28/2022   left lateral eye, recurrent, EDC. 5FU/Calcipotriene cream.   SCC (squamous cell carcinoma) 01/28/2022   in situ, left medial cheek, 5FU/Calcipotriene Cr   SCC (squamous cell carcinoma) 02/26/2022   SCC IS, Right Ulnar Dorsal Hand, 5FU/Calcipotriene cream   Squamous cell carcinoma of skin 09/06/2020   R thumb - ED&C   Urinary tract infection without hematuria 01/02/2020   Urticaria 06/02/2017    PAST SURGICAL HISTORY:  Past Surgical History:  Procedure Laterality Date   ABDOMINAL  HYSTERECTOMY     complete   BREAST SURGERY      HEMATOLOGY/ONCOLOGY HISTORY:  Oncology History Overview Note  Laura Wilcox is a 89 y.o. female with polycythemia rubra vera and secondary myelofibrosis.  She presented in 04/2002 with thrombocytosis (835,000).  JAK2 V617F was positive on 05/20/2012.   Over the past year, hematocrit has ranged 37.5 - 42.4, hemoglobin 12.1 - 13.4, platelets 458,000 - 569,000, WBC 10,500 - 15,700.  Creatinine has been 1.0 - 1.2.   Bone marrow  on 04/15/2002 revealed a normocellular marrow for age with trilineage hematopoiesis and mild megakaryocytic hyperplasia with focal atypia.  There was no morphologic evidence of a myeloproliferative disorder.  Iron stains were inadequate for evaluation of storage iron.  There was no increase in reticulin fibers.  She has been on agrylin since 2012.   Bone marrow on 09/25/2012 revealed a persistent myeloproliferative disorder s/p Agrylin.  The current features were best classified as persistent myeloproliferative neoplasm (MPN) JAK2 V617F mutation positive.  Marrow was hypercellular for age (30-50%) with increased trilineage hematopoiesis and markedly increased atypical megakaryopoiesis with frequent clustering.  There were no increased blasts.  Iron stores were absent.  There was moderate to severe myelofibrosis (grade MF 2-3).  Differential included ET, PV, primary myelofibrosis, and MPN, unclassifiable.  Cytogenetics were normal (63, XX).   She developed pancytopenia in 12/2015.  Etiology is unclear (medication confusion or hydroxyurea + Agrylin).   She received Procrit 40,000 units (intermittently 09/2012 - 06/2016), Venofer (09/2012 - 10/2012), and Infed (12/2015 - 03/2016).  She received Neupogen during a period of neutropenia. She was  anagrelide from 2004 - 08/13/2017.  She began [place] on 08/14/2017.  Jakafi was increased from 5 mg BID to 10 mg BID on 06/17/2018.  Jakafi was increased to 15 mg BID on 08/03/2018.   She  began a phlebotomy program on 08/06/2017.  She undergoes small volume (150 cc with replacement of 150 cc NS) if her HCT > 42.   Ferritin has been followed: 529 on 09/03/2017, 1020 on 12/03/2017, 423 on 02/18/2018, and 858 on 11/17/2018.  Iron saturation was 37% and TIBC 270 on 11/17/2018.  B12 was 477 and folate 36 on 11/17/2018.   She was admitted to [facility] from 12/17/2018 - 12/18/2018.  She received 1 unit of PRBCs.  CBC at discharge included a hematocrit of 29.0, hemoglobin 9.4, platelets 364,000, and WBC 10,200.    Polycythemia rubra vera ([place])  06/02/2017 Initial Diagnosis   Polycythemia rubra vera (HCC)     ALLERGIES:  is allergic to bactrim [sulfamethoxazole-trimethoprim] and epinephrine.  MEDICATIONS:  Current Outpatient Medications  Medication Sig Dispense Refill   acetaminophen (TYLENOL) 325 MG tablet Take 325-650 mg by mouth every 6 (six) hours as needed for mild pain or moderate pain.     aspirin EC 81 MG tablet Take 81 mg by mouth daily. Swallow whole.     calcitRIOL (ROCALTROL) 0.25 MCG capsule Take 0.25 mcg by mouth every Monday, Wednesday, and Friday.     feeding supplement (ENSURE ENLIVE / ENSURE PLUS) LIQD Take 237 mLs by mouth 2 (two) times daily between meals. 237 mL 12   polyethylene glycol (MIRALAX / GLYCOLAX) 17 g packet Take 17 g by mouth daily as needed for mild constipation. 14 each 0   ruxolitinib phosphate (JAKAFI) 5 MG tablet Take 1 tablet (5 mg total) by mouth 2 (two) times daily. 90 tablet 2   vitamin B-12 (CYANOCOBALAMIN) 1000 MCG tablet TAKE 1 TABLET BY MOUTH DAILY 30 tablet 1   citalopram (CELEXA) 20 MG tablet Take 1 tablet (20 mg total) by mouth daily. 30 tablet 3   VELTASSA 16.8 g PACK Take by mouth. (Patient not taking: Reported on 05/17/2022)     No current facility-administered medications for this visit.    VITAL SIGNS: BP 101/63   Pulse 84   Temp (!) 96.7 F (35.9 C) (Tympanic)   Resp 18   Ht 5' (1.524 m)   Wt 126 lb (57.2 kg)   BMI 24.61 kg/m   Filed Weights   05/17/22 1337  Weight: 126 lb (57.2 kg)     Estimated body mass index is 24.61 kg/m as calculated from the following:   Height as of this encounter: 5' (1.524 m).   Weight as of this encounter: 126 lb (57.2 kg).  LABS: CBC:    Component Value Date/Time   WBC 11.8 (H) 05/17/2022 1321   HGB 9.1 (L) 05/17/2022 1321   HGB 8.1 (L) 06/18/2021 1514   HCT 29.9 (L) 05/17/2022 1321   HCT 25.2 (L) 06/18/2021 1514   PLT 781 (H) 05/17/2022 1321   PLT 452 (H) 06/18/2021 1514   MCV 97.7 05/17/2022 1321   MCV 91 06/18/2021 1514   NEUTROABS 6.6 05/17/2022 1321   NEUTROABS 6.5 06/18/2021 1514   LYMPHSABS 1.9 05/17/2022 1321   LYMPHSABS 1.0 06/18/2021 1514   MONOABS 1.5 (H) 05/17/2022 1321   EOSABS 0.5 05/17/2022 1321   EOSABS 0.4 06/18/2021 1514   BASOSABS 0.2 (H) 05/17/2022 1321   BASOSABS 0.3 (H) 06/18/2021 1514   Comprehensive Metabolic Panel:    Component  Value Date/Time   NA 139 04/02/2022 1352   NA 134 06/18/2021 1514   K 3.6 04/02/2022 1352   CL 101 04/02/2022 1352   CO2 24 04/02/2022 1352   BUN 25 (H) 04/02/2022 1352   BUN 19 06/18/2021 1514   CREATININE 1.50 (H) 04/02/2022 1352   GLUCOSE 124 (H) 04/02/2022 1352   CALCIUM 9.2 04/02/2022 1352   AST 25 04/02/2022 1352   ALT 16 04/02/2022 1352   ALKPHOS 102 04/02/2022 1352   BILITOT 0.4 04/02/2022 1352   BILITOT 0.3 06/18/2021 1514   PROT 7.0 04/02/2022 1352   PROT 5.8 (L) 06/18/2021 1514   ALBUMIN 3.8 04/02/2022 1352   ALBUMIN 3.6 06/18/2021 1514    RADIOGRAPHIC STUDIES: No results found.  PERFORMANCE STATUS (ECOG) : 2 - Symptomatic, <50% confined to bed  Review of Systems Unless otherwise noted, a complete review of systems is negative.  Physical Exam General: NAD Pulmonary: Unlabored Extremities: no edema, no joint deformities Skin: no rashes Neurological: Weakness, baseline confusion  IMPRESSION: I met with patient and daughter.   Daughter requested follow-up with me to discuss dose  adjustment of citalopram.  Patient was started on citalopram nearly a year ago for depressive symptoms and agitation secondary to her dementia.  Patient had improvement in symptoms, moods, and functioning after initiation of citalopram.  Patient has maintained current dose over the past year.  Recently, daughter reports that patient has had increased agitation and frequent refusal to participate in daily care/bathing at ALF.  Patient has been sleeping more during the day and just does not wish to be bothered.  Daughter has not noticed any emotional lability but patient is quick to anger and has verbally lashed out to daughter and caregivers.  Symptoms likely reflect progression of dementia.  However, patient does have history of recurrent UTIs and so we will check UA/culture today.  Agreed to increase citalopram to 20 mg daily.  Would defer further workup/management to PCP.  I did call and speak with Christin Gusler, palliative care NP who follows patients at the ALF.   PLAN: -Continue current scope of treatment -Increase citalopram to 81m daily -UA/culture -Referral to palliative care -RTC as needed  Patient expressed understanding and was in agreement with this plan. She also understands that She can call the clinic at any time with any questions, concerns, or complaints.     Time Total: 25 minutes  Visit consisted of counseling and education dealing with the complex and emotionally intense issues of symptom management and palliative care in the setting of serious and potentially life-threatening illness.Greater than 50%  of this time was spent counseling and coordinating care related to the above assessment and plan.  Signed by: JAltha Harm PhD, NP-C

## 2022-05-17 NOTE — Telephone Encounter (Signed)
Faxed pt's office note from today and celexa script to [facility]. Fax conf. Rcvd.

## 2022-05-20 ENCOUNTER — Non-Acute Institutional Stay: Payer: Medicare Other | Admitting: Nurse Practitioner

## 2022-05-20 ENCOUNTER — Encounter: Payer: Self-pay | Admitting: Nurse Practitioner

## 2022-05-20 ENCOUNTER — Telehealth: Payer: Self-pay | Admitting: Hospice and Palliative Medicine

## 2022-05-20 DIAGNOSIS — N1832 Chronic kidney disease, stage 3b: Secondary | ICD-10-CM | POA: Diagnosis not present

## 2022-05-20 DIAGNOSIS — G301 Alzheimer's disease with late onset: Secondary | ICD-10-CM | POA: Diagnosis not present

## 2022-05-20 DIAGNOSIS — R2681 Unsteadiness on feet: Secondary | ICD-10-CM | POA: Diagnosis not present

## 2022-05-20 DIAGNOSIS — R278 Other lack of coordination: Secondary | ICD-10-CM | POA: Diagnosis not present

## 2022-05-20 DIAGNOSIS — N390 Urinary tract infection, site not specified: Secondary | ICD-10-CM

## 2022-05-20 DIAGNOSIS — F330 Major depressive disorder, recurrent, mild: Secondary | ICD-10-CM | POA: Diagnosis not present

## 2022-05-20 DIAGNOSIS — F0393 Unspecified dementia, unspecified severity, with mood disturbance: Secondary | ICD-10-CM

## 2022-05-20 DIAGNOSIS — M6281 Muscle weakness (generalized): Secondary | ICD-10-CM | POA: Diagnosis not present

## 2022-05-20 DIAGNOSIS — Z515 Encounter for palliative care: Secondary | ICD-10-CM

## 2022-05-20 DIAGNOSIS — E440 Moderate protein-calorie malnutrition: Secondary | ICD-10-CM

## 2022-05-20 DIAGNOSIS — R296 Repeated falls: Secondary | ICD-10-CM | POA: Diagnosis not present

## 2022-05-20 LAB — URINE CULTURE: Culture: >=100000 — AB

## 2022-05-20 NOTE — Telephone Encounter (Signed)
UA/culture appear consistent with UTI.  Greater than 100,000 CFU's with GNR on culture.  Attempted to call daughter to discuss but was unable to reach her.  Spoke with Christin Gusler, NP at the facility who agreed to prescribe cephalexin 500 mg twice daily x 7 days.

## 2022-05-20 NOTE — Progress Notes (Signed)
[facility] Consult Note Telephone: [phone]  Fax: [phone]   Date of encounter: 05/20/22 5:21 PM PATIENT NAME: Laura Wilcox [id number] [place] [address]   [phone] (home)  DOB: 06-27-1926 MRN: [id number] PRIMARY CARE PROVIDER:    [facility]  RESPONSIBLE PARTY:    Contact Information     Name Relation Home Work Mobile   [place] Daughter   [phone]   JAMIEMARIE, CHOWDHURY   [phone]   watts,[place] Relative   [phone]      I met face to face with patient in facility. Palliative Care was asked to follow this patient by consultation request of  [facility] to address advance care planning and complex medical decision making. This is the initial visit.      ASSESSMENT AND PLAN / RECOMMENDATIONS:  Symptom Management/Plan: 1. Advance Care Planning;   2. Goals of Care: Goals include to maximize quality of life and symptom management. Our advance care planning conversation included a discussion about:    The value and importance of advance care planning  Exploration of personal, cultural or spiritual beliefs that might influence medical decisions  Exploration of goals of care in the event of a sudden injury or illness  Identification and preparation of a healthcare agent  Review and updating or creation of an advance directive document. 3. Palliative care encounter; Palliative care encounter; Palliative medicine team will continue to support patient, patient's family, and medical team. Visit consisted of counseling and education dealing with the complex and emotionally intense issues of symptom management and palliative care in the setting of serious and potentially life-threatening illness  4. UTI; reviewed urine with [facility]; increase oral hydration; monitor for fever; prescribed keflex 555m bid x 10  days; order written. Primary to continue to follow; Josh Borders NP to f/u with culture and contact if keflex is not sensitive.   5. Dementia progressive, ongoing decline, cognitively, functionally. Mood swings per staff and dtg. Discussed with Psych NP and will see today for behaviors. Celexa was increased to 214mdaily by JoBilley ChangP, updated Psych NP. Discussed at length with dtg progressive decline, when increase in skill level and what that may look like, Medicaid program for LTC, application process defer to Social Security office/Medicaid office;   6. Anorexia, protein calorie malnutrition. Gain this month Reviewed weights, continue to monitor weights, continue to encourage to eat, may benefit from appetite stimulant though also possible pill burden. Encourage supplements.  12/17/2021 weight 118 lbs 04/02/2022 weight 122.9 lbs 05/17/2022 weight 126 lbs Follow up Palliative Care Visit: PC f/u visit further discussion monitor trends of appetite, weights, monitor for functional, cognitive decline with chronic disease progression, assess any active symptoms, supportive role.Palliative care will continue to follow for complex medical decision making, advance care planning, and clarification of goals. Return 4 to 8 weeks or prn.  I spent 61 minutes providing this consultation starting at 12:00 pm. More than 50% of the time in this consultation was spent in counseling and care coordination. PPS: 50% Chief Complaint: Initial palliative consult for complex medical decision making, address goals, manage ongoing symptoms  HISTORY OF PRESENT ILLNESS:  PaLinzy Alexandras a [age].o. year old female  with multiple medical problems including Dementia, polycythemia vera and secondary myelofibrosis taking jakafi, anemia of chronic disease, CKD, h/o UTI's, depression, protein calorie malnutrition. [facility]  staff, no recent falls. Laura Linen requires  assistance with adl's, able to toilet herself with some incontience episodes. Laura Wawrzyniak feeds herself with some prompting, deceased appetite.  Purpose of today PC f/u visit further discussion monitor trends of appetite, weights, monitor for functional, cognitive decline with chronic disease progression, assess any active symptoms, supportive role. At present Laura Wilcox is standing in front of her mirror brushing her hair. Laura Wilcox did ambulate to her bed. Felipe Drone NP [facility] student and I visited and observed Laura Wilcox. We talked about how she has been feeling. Laura Wilcox did make eye contact, able to answer simple questions, though difficulty recalling some recent answers, difficulty processing and following questions. Laura Wilcox denies pain, complains of being very tired and wanting to sleep. Limited discussion with cognitive impairment. Celexa has been changed to 20m po daily. Laura VMoschettowas cooperative with assessment, support provided. Medications, poc, goc reviewed, called dtg, TArrie Aran clinical update discussed. Talked about the last timeMs VLebeckwas independent at home, family, social history, PMH, functional and cognitive decline. We talked about concerns with overall decline in the setting of progressive dementia. Talked about UTI prescribed keflex. Discussed f/u visit. Questions answered. Therapeutic listening, emotional support provided. Updated staff,   History obtained from review of EMR, discussion with primary team, and interview with family, facility staff/caregiver and/or Laura. VDeirdre Wilcox  I reviewed available labs, medications, imaging, studies and related documents from the EMR.  Records reviewed and summarized above.   Physical Exam: Constitutional: NAD General: frail appearing, thin, confused female ENMT: oral mucous membranes moist CV: S1S2, RRR Pulmonary: LCTA, no increased work of breathing, no cough, room air Abdomen: normo-active BS + 4 quadrants, soft and non  tender MSK: ambulatory Skin: warm and dry Neuro:  + generalized weakness,  + cognitive impairment Psych: non-anxious affect, Alert, confused CURRENT PROBLEM LIST:  Patient Active Problem List   Diagnosis Date Noted   SCC (squamous cell carcinoma) 03/19/2022   Skin tear of lower leg without complication, right, initial encounter 09/24/2021   Sepsis ([facility] 09/24/2021   Leg wound, right 09/24/2021   Encounter for antineoplastic chemotherapy 09/21/2021   Senile dementia with acute confusional state, without behavioral disturbance ([facility] 06/26/2021   Depression 06/26/2021   Weakness 06/26/2021   UTI (urinary tract infection) 06/25/2021   Myelofibrosis ([facility] 01/31/2020   Other pancytopenia ([facility] 01/31/2020   Moderate protein-calorie malnutrition ([facility]    Squamous cell cancer of skin of left cheek 01/16/2020   Chronic kidney disease, stage 3b ([place] 03/11/2019   Secondary hyperparathyroidism of renal origin ([facility] 03/11/2019   Anemia in chronic kidney disease 03/11/2019   Elevated ferritin 02/18/2018   Goals of care, counseling/discussion 07/09/2017   Polycythemia rubra vera ([facility] 06/02/2017   Protein-calorie malnutrition, moderate ([facility] 06/02/2017   Advanced care planning/counseling discussion 06/02/2017   Age-related cognitive decline 06/02/2017   PAST MEDICAL HISTORY:  Active Ambulatory Problems    Diagnosis Date Noted   Polycythemia rubra vera ([facility] 06/02/2017   Protein-calorie malnutrition, moderate ([facility] 06/02/2017   Advanced care planning/counseling discussion 06/02/2017   Age-related cognitive decline 06/02/2017   Goals of care, counseling/discussion 07/09/2017   Elevated ferritin 02/18/2018   Squamous cell cancer of skin of left cheek 01/16/2020   Chronic kidney disease, stage 3b ([facility] 03/11/2019   Secondary hyperparathyroidism of renal origin ([facility] 03/11/2019   Anemia in chronic kidney disease 03/11/2019   Myelofibrosis (HC[place] 01/31/2020   Other pancytopenia ([facility]  01/31/2020   Moderate protein-calorie malnutrition ([place]  UTI (urinary tract infection) 06/25/2021   Senile dementia with acute confusional state, without behavioral disturbance ([place]) 06/26/2021   Depression 06/26/2021   Weakness 06/26/2021   Encounter for antineoplastic chemotherapy 09/21/2021   Skin tear of lower leg without complication, right, initial encounter 09/24/2021   Sepsis ([place]) 09/24/2021   Leg wound, right 09/24/2021   SCC (squamous cell carcinoma) 03/19/2022   Resolved Ambulatory Problems    Diagnosis Date Noted   Urticaria 06/02/2017   Generalized weakness 12/17/2018   Confusion 12/30/2018   Urinary tract infection without hematuria Q000111Q   Acute metabolic encephalopathy Q000111Q   Past Medical History:  Diagnosis Date   Polycythemia    Squamous cell carcinoma of skin 09/06/2020   SOCIAL HX:  Social History   Tobacco Use   Smoking status: Never   Smokeless tobacco: Never  Substance Use Topics   Alcohol use: No    Comment: occasional glass of wine   FAMILY HX:  Family History  Problem Relation Age of Onset   Heart disease Father    Cancer Sister        breast   Cancer Sister        breast   Alzheimer's disease Sister       ALLERGIES:  Allergies  Allergen Reactions   Bactrim [Sulfamethoxazole-Trimethoprim]     Due to kidney disease    Epinephrine Other (See Comments)     PERTINENT MEDICATIONS:  Outpatient Encounter Medications as of 05/20/2022  Medication Sig   acetaminophen (TYLENOL) 325 MG tablet Take 325-650 mg by mouth every 6 (six) hours as needed for mild pain or moderate pain.   aspirin EC 81 MG tablet Take 81 mg by mouth daily. Swallow whole.   calcitRIOL (ROCALTROL) 0.25 MCG capsule Take 0.25 mcg by mouth every Monday, Wednesday, and Friday.   citalopram (CELEXA) 20 MG tablet Take 1 tablet (20 mg total) by mouth daily.   feeding supplement (ENSURE ENLIVE / ENSURE PLUS) LIQD Take 237 mLs by mouth 2 (two) times daily between  meals.   polyethylene glycol (MIRALAX / GLYCOLAX) 17 g packet Take 17 g by mouth daily as needed for mild constipation.   ruxolitinib phosphate (JAKAFI) 5 MG tablet Take 1 tablet (5 mg total) by mouth 2 (two) times daily.   VELTASSA 16.8 g PACK Take by mouth. (Patient not taking: Reported on 05/17/2022)   vitamin B-12 (CYANOCOBALAMIN) 1000 MCG tablet TAKE 1 TABLET BY MOUTH DAILY   No facility-administered encounter medications on file as of 05/20/2022.   Thank you for the opportunity to participate in the care of Laura. Laura Wilcox.  The palliative care team will continue to follow. Please call our office at [phone] if we can be of additional assistance.   Pansie Guggisberg Z Robertine Kipper, NP ,

## 2022-05-21 DIAGNOSIS — R278 Other lack of coordination: Secondary | ICD-10-CM | POA: Diagnosis not present

## 2022-05-21 DIAGNOSIS — R296 Repeated falls: Secondary | ICD-10-CM | POA: Diagnosis not present

## 2022-05-21 DIAGNOSIS — M6281 Muscle weakness (generalized): Secondary | ICD-10-CM | POA: Diagnosis not present

## 2022-05-21 DIAGNOSIS — R2681 Unsteadiness on feet: Secondary | ICD-10-CM | POA: Diagnosis not present

## 2022-05-21 DIAGNOSIS — N1832 Chronic kidney disease, stage 3b: Secondary | ICD-10-CM | POA: Diagnosis not present

## 2022-05-21 DIAGNOSIS — G301 Alzheimer's disease with late onset: Secondary | ICD-10-CM | POA: Diagnosis not present

## 2022-05-22 DIAGNOSIS — R2681 Unsteadiness on feet: Secondary | ICD-10-CM | POA: Diagnosis not present

## 2022-05-22 DIAGNOSIS — E441 Mild protein-calorie malnutrition: Secondary | ICD-10-CM | POA: Diagnosis not present

## 2022-05-22 DIAGNOSIS — R296 Repeated falls: Secondary | ICD-10-CM | POA: Diagnosis not present

## 2022-05-22 DIAGNOSIS — R278 Other lack of coordination: Secondary | ICD-10-CM | POA: Diagnosis not present

## 2022-05-22 DIAGNOSIS — N1832 Chronic kidney disease, stage 3b: Secondary | ICD-10-CM | POA: Diagnosis not present

## 2022-05-22 DIAGNOSIS — F02B4 Dementia in other diseases classified elsewhere, moderate, with anxiety: Secondary | ICD-10-CM | POA: Diagnosis not present

## 2022-05-22 DIAGNOSIS — M6281 Muscle weakness (generalized): Secondary | ICD-10-CM | POA: Diagnosis not present

## 2022-05-22 DIAGNOSIS — G301 Alzheimer's disease with late onset: Secondary | ICD-10-CM | POA: Diagnosis not present

## 2022-05-22 DIAGNOSIS — R269 Unspecified abnormalities of gait and mobility: Secondary | ICD-10-CM | POA: Diagnosis not present

## 2022-05-22 DIAGNOSIS — N3000 Acute cystitis without hematuria: Secondary | ICD-10-CM | POA: Diagnosis not present

## 2022-05-23 DIAGNOSIS — R296 Repeated falls: Secondary | ICD-10-CM | POA: Diagnosis not present

## 2022-05-23 DIAGNOSIS — R278 Other lack of coordination: Secondary | ICD-10-CM | POA: Diagnosis not present

## 2022-05-23 DIAGNOSIS — R2681 Unsteadiness on feet: Secondary | ICD-10-CM | POA: Diagnosis not present

## 2022-05-23 DIAGNOSIS — G301 Alzheimer's disease with late onset: Secondary | ICD-10-CM | POA: Diagnosis not present

## 2022-05-23 DIAGNOSIS — M6281 Muscle weakness (generalized): Secondary | ICD-10-CM | POA: Diagnosis not present

## 2022-05-23 DIAGNOSIS — N1832 Chronic kidney disease, stage 3b: Secondary | ICD-10-CM | POA: Diagnosis not present

## 2022-05-27 ENCOUNTER — Other Ambulatory Visit (HOSPITAL_COMMUNITY): Payer: Self-pay

## 2022-05-27 DIAGNOSIS — G301 Alzheimer's disease with late onset: Secondary | ICD-10-CM | POA: Diagnosis not present

## 2022-05-27 DIAGNOSIS — R278 Other lack of coordination: Secondary | ICD-10-CM | POA: Diagnosis not present

## 2022-05-27 DIAGNOSIS — R2681 Unsteadiness on feet: Secondary | ICD-10-CM | POA: Diagnosis not present

## 2022-05-27 DIAGNOSIS — N1832 Chronic kidney disease, stage 3b: Secondary | ICD-10-CM | POA: Diagnosis not present

## 2022-05-27 DIAGNOSIS — M6281 Muscle weakness (generalized): Secondary | ICD-10-CM | POA: Diagnosis not present

## 2022-05-27 DIAGNOSIS — R296 Repeated falls: Secondary | ICD-10-CM | POA: Diagnosis not present

## 2022-05-28 ENCOUNTER — Inpatient Hospital Stay: Payer: Medicare Other

## 2022-05-29 ENCOUNTER — Other Ambulatory Visit (HOSPITAL_COMMUNITY): Payer: Self-pay

## 2022-05-29 DIAGNOSIS — M6281 Muscle weakness (generalized): Secondary | ICD-10-CM | POA: Diagnosis not present

## 2022-05-29 DIAGNOSIS — N1832 Chronic kidney disease, stage 3b: Secondary | ICD-10-CM | POA: Diagnosis not present

## 2022-05-29 DIAGNOSIS — R2681 Unsteadiness on feet: Secondary | ICD-10-CM | POA: Diagnosis not present

## 2022-05-29 DIAGNOSIS — R278 Other lack of coordination: Secondary | ICD-10-CM | POA: Diagnosis not present

## 2022-05-29 DIAGNOSIS — R296 Repeated falls: Secondary | ICD-10-CM | POA: Diagnosis not present

## 2022-05-29 DIAGNOSIS — G301 Alzheimer's disease with late onset: Secondary | ICD-10-CM | POA: Diagnosis not present

## 2022-05-30 ENCOUNTER — Non-Acute Institutional Stay: Payer: Medicare Other | Admitting: Nurse Practitioner

## 2022-05-30 ENCOUNTER — Encounter: Payer: Self-pay | Admitting: Nurse Practitioner

## 2022-05-30 ENCOUNTER — Other Ambulatory Visit (HOSPITAL_COMMUNITY): Payer: Self-pay

## 2022-05-30 DIAGNOSIS — R296 Repeated falls: Secondary | ICD-10-CM | POA: Diagnosis not present

## 2022-05-30 DIAGNOSIS — Z515 Encounter for palliative care: Secondary | ICD-10-CM

## 2022-05-30 DIAGNOSIS — E440 Moderate protein-calorie malnutrition: Secondary | ICD-10-CM | POA: Diagnosis not present

## 2022-05-30 DIAGNOSIS — R2681 Unsteadiness on feet: Secondary | ICD-10-CM | POA: Diagnosis not present

## 2022-05-30 DIAGNOSIS — R278 Other lack of coordination: Secondary | ICD-10-CM | POA: Diagnosis not present

## 2022-05-30 DIAGNOSIS — F0393 Unspecified dementia, unspecified severity, with mood disturbance: Secondary | ICD-10-CM | POA: Diagnosis not present

## 2022-05-30 DIAGNOSIS — G301 Alzheimer's disease with late onset: Secondary | ICD-10-CM | POA: Diagnosis not present

## 2022-05-30 DIAGNOSIS — M6281 Muscle weakness (generalized): Secondary | ICD-10-CM | POA: Diagnosis not present

## 2022-05-30 DIAGNOSIS — N1832 Chronic kidney disease, stage 3b: Secondary | ICD-10-CM | POA: Diagnosis not present

## 2022-05-30 NOTE — Progress Notes (Addendum)
[place] Consult Note Telephone: [phone]  Fax: [phone]    Date of encounter: 05/30/22 4:57 PM PATIENT NAME: Laura Wilcox [id number] [place] [address]   [phone] (home)  DOB: [birth year] MRN: [id number] PRIMARY CARE PROVIDER:   [facility]ed [facility] care unit; [facility] Glean Hess, MD,  [address] [place] [address] [phone]  RESPONSIBLE PARTY:    Contact Information     Name Relation Home Work Mobile   Boissevain Daughter   [phone]   JEMIMA, PORTALES   [phone]   watts,sherwood Relative   [phone]      I met face to face with patient in facility. Palliative Care was asked to follow this patient by consultation request of  [facility] to address advance care planning and complex medical decision making. This is the initial visit.      ASSESSMENT AND PLAN / RECOMMENDATIONS:  Symptom Management/Plan: 1. Advance Care Planning;   2. Goals of Care: Goals include to maximize quality of life and symptom management. Our advance care planning conversation included a discussion about:    The value and importance of advance care planning  Exploration of personal, cultural or spiritual beliefs that might influence medical decisions  Exploration of goals of care in the event of a sudden injury or illness  Identification and preparation of a healthcare agent  Review and updating or creation of an advance directive document. 3. Palliative care encounter; Palliative care encounter; Palliative medicine team will continue to support patient, patient's family, and medical team. Visit consisted of counseling and education dealing with the complex and emotionally intense issues of symptom management and palliative care in the setting of serious and potentially life-threatening illness   4. Dementia progressive, ongoing decline,  cognitively, functionally. Mood swings contininues; continue to be followed by Psychiatry   5. Anorexia, protein calorie malnutrition. Gain this month Reviewed weights, continue to monitor weights, continue to encourage to eat, may benefit from appetite stimulant though also possible pill burden. Encourage supplements.  12/17/2021 weight 118 lbs 04/02/2022 weight 122.9 lbs 05/17/2022 weight 126 lbs Weight gain; will see what weights are in 05/2022 Follow up Palliative Care Visit: PC f/u visit further discussion monitor trends of appetite, weights, monitor for functional, cognitive decline with chronic disease progression, assess any active symptoms, supportive role.Palliative care will continue to follow for complex medical decision making, advance care planning, and clarification of goals. Return 4 to 8 weeks or prn.   I spent 41 minutes providing this consultation. More than 50% of the time in this consultation was spent in counseling and care coordination. PPS: 50% Chief Complaint: Initial palliative consult for complex medical decision making, address goals, manage ongoing symptoms   HISTORY OF PRESENT ILLNESS:  Laura Wilcox is a 89 y.o. year old female  with multiple medical problems including Dementia, polycythemia vera and secondary myelofibrosis taking jakafi, anemia of chronic disease, CKD, h/o UTI's, depression, protein calorie malnutrition. Ms Laura Wilcox resides at [facility]. Ms Laura Wilcox is ambulatory per staff, no recent falls. Ms Laura Wilcox requires assistance with adl's, able to toilet herself with some incontience episodes. Ms Laura Wilcox feeds herself with some prompting, deceased appetite.  Purpose of today PC f/u visit further discussion monitor trends of appetite, weights, monitor for functional, cognitive decline with chronic disease progression, assess any active symptoms, supportive role. At present Ms Laura Wilcox is sitting at the dining table in the dining area. Ms  Laura Wilcox does make eye  contact, able to answer few simple questions though limited with cognitive impairment. Supportive visit. Staff endorses becoming more difficult with re-direction especially during bathing. Ms Laura Wilcox was cooperative with assessment. Attempted to contact dtg. Medications, goc, poc reviewed. Updated staff  05/31/2022, I called [name], Ms [place] dtg. Clinical update discussed, talked about pc visit. We talked about ros, completing abx for UTI with behaviors continues. We talked about psych NP following and recent increase in celexa. We talked about behaviors, expectations with disease progression. We talked about appetite, weight being stable. We talked about following the next 4 weeks to see if decline, progression of chronic disease, may benefit from hospice if continues to decline overall. Support provided.  History obtained from review of EMR, discussion with primary team, and interview with family, facility staff/caregiver and/or Laura Wilcox.  I reviewed available labs, medications, imaging, studies and related documents from the EMR.  Records reviewed and summarized above.    Physical Exam: Constitutional: NAD General: frail appearing, thin, confused female ENMT: oral mucous membranes moist CV: S1S2, RRR Pulmonary: LCTA MSK: ambulatory Neuro:  + generalized weakness,  + cognitive impairment Psych: non-anxious affect, Alert, confused Thank you for the opportunity to participate in the care of Laura Wilcox. Please call our office at [phone] if we can be of additional assistance.   Jamonte Curfman Ihor Gully, NP

## 2022-06-04 DIAGNOSIS — R2681 Unsteadiness on feet: Secondary | ICD-10-CM | POA: Diagnosis not present

## 2022-06-04 DIAGNOSIS — N1832 Chronic kidney disease, stage 3b: Secondary | ICD-10-CM | POA: Diagnosis not present

## 2022-06-04 DIAGNOSIS — M6281 Muscle weakness (generalized): Secondary | ICD-10-CM | POA: Diagnosis not present

## 2022-06-04 DIAGNOSIS — R278 Other lack of coordination: Secondary | ICD-10-CM | POA: Diagnosis not present

## 2022-06-04 DIAGNOSIS — R296 Repeated falls: Secondary | ICD-10-CM | POA: Diagnosis not present

## 2022-06-04 DIAGNOSIS — G301 Alzheimer's disease with late onset: Secondary | ICD-10-CM | POA: Diagnosis not present

## 2022-06-05 DIAGNOSIS — R296 Repeated falls: Secondary | ICD-10-CM | POA: Diagnosis not present

## 2022-06-05 DIAGNOSIS — N1832 Chronic kidney disease, stage 3b: Secondary | ICD-10-CM | POA: Diagnosis not present

## 2022-06-05 DIAGNOSIS — G301 Alzheimer's disease with late onset: Secondary | ICD-10-CM | POA: Diagnosis not present

## 2022-06-05 DIAGNOSIS — R278 Other lack of coordination: Secondary | ICD-10-CM | POA: Diagnosis not present

## 2022-06-05 DIAGNOSIS — R2681 Unsteadiness on feet: Secondary | ICD-10-CM | POA: Diagnosis not present

## 2022-06-05 DIAGNOSIS — M6281 Muscle weakness (generalized): Secondary | ICD-10-CM | POA: Diagnosis not present

## 2022-06-07 DIAGNOSIS — N1832 Chronic kidney disease, stage 3b: Secondary | ICD-10-CM | POA: Diagnosis not present

## 2022-06-07 DIAGNOSIS — M6281 Muscle weakness (generalized): Secondary | ICD-10-CM | POA: Diagnosis not present

## 2022-06-07 DIAGNOSIS — R278 Other lack of coordination: Secondary | ICD-10-CM | POA: Diagnosis not present

## 2022-06-07 DIAGNOSIS — G301 Alzheimer's disease with late onset: Secondary | ICD-10-CM | POA: Diagnosis not present

## 2022-06-07 DIAGNOSIS — R2681 Unsteadiness on feet: Secondary | ICD-10-CM | POA: Diagnosis not present

## 2022-06-07 DIAGNOSIS — R296 Repeated falls: Secondary | ICD-10-CM | POA: Diagnosis not present

## 2022-06-07 DIAGNOSIS — E441 Mild protein-calorie malnutrition: Secondary | ICD-10-CM | POA: Diagnosis not present

## 2022-06-10 ENCOUNTER — Telehealth: Payer: Self-pay | Admitting: Oncology

## 2022-06-10 NOTE — Telephone Encounter (Signed)
pt daughter called to have appt r/s. pt has been r/s for 4/10 DB within guidelines. Advised pt that I would send a message to see if needs to be done sooner. This will Overbook MD 4 or more times if scheduled anyday prior to 4/10 Please advise.

## 2022-06-11 ENCOUNTER — Inpatient Hospital Stay: Payer: Medicare Other

## 2022-06-11 DIAGNOSIS — R296 Repeated falls: Secondary | ICD-10-CM | POA: Diagnosis not present

## 2022-06-11 DIAGNOSIS — R278 Other lack of coordination: Secondary | ICD-10-CM | POA: Diagnosis not present

## 2022-06-11 DIAGNOSIS — G301 Alzheimer's disease with late onset: Secondary | ICD-10-CM | POA: Diagnosis not present

## 2022-06-11 DIAGNOSIS — R2681 Unsteadiness on feet: Secondary | ICD-10-CM | POA: Diagnosis not present

## 2022-06-11 DIAGNOSIS — M6281 Muscle weakness (generalized): Secondary | ICD-10-CM | POA: Diagnosis not present

## 2022-06-11 DIAGNOSIS — N1832 Chronic kidney disease, stage 3b: Secondary | ICD-10-CM | POA: Diagnosis not present

## 2022-06-12 ENCOUNTER — Inpatient Hospital Stay: Payer: Medicare Other

## 2022-06-12 DIAGNOSIS — G301 Alzheimer's disease with late onset: Secondary | ICD-10-CM | POA: Diagnosis not present

## 2022-06-12 DIAGNOSIS — R296 Repeated falls: Secondary | ICD-10-CM | POA: Diagnosis not present

## 2022-06-12 DIAGNOSIS — R2681 Unsteadiness on feet: Secondary | ICD-10-CM | POA: Diagnosis not present

## 2022-06-12 DIAGNOSIS — N1832 Chronic kidney disease, stage 3b: Secondary | ICD-10-CM | POA: Diagnosis not present

## 2022-06-12 DIAGNOSIS — M6281 Muscle weakness (generalized): Secondary | ICD-10-CM | POA: Diagnosis not present

## 2022-06-12 DIAGNOSIS — F02B4 Dementia in other diseases classified elsewhere, moderate, with anxiety: Secondary | ICD-10-CM | POA: Diagnosis not present

## 2022-06-12 DIAGNOSIS — R278 Other lack of coordination: Secondary | ICD-10-CM | POA: Diagnosis not present

## 2022-06-12 DIAGNOSIS — S0012XA Contusion of left eyelid and periocular area, initial encounter: Secondary | ICD-10-CM | POA: Diagnosis not present

## 2022-06-13 DIAGNOSIS — N1832 Chronic kidney disease, stage 3b: Secondary | ICD-10-CM | POA: Diagnosis not present

## 2022-06-13 DIAGNOSIS — R2681 Unsteadiness on feet: Secondary | ICD-10-CM | POA: Diagnosis not present

## 2022-06-13 DIAGNOSIS — R278 Other lack of coordination: Secondary | ICD-10-CM | POA: Diagnosis not present

## 2022-06-13 DIAGNOSIS — R296 Repeated falls: Secondary | ICD-10-CM | POA: Diagnosis not present

## 2022-06-13 DIAGNOSIS — M6281 Muscle weakness (generalized): Secondary | ICD-10-CM | POA: Diagnosis not present

## 2022-06-13 DIAGNOSIS — G301 Alzheimer's disease with late onset: Secondary | ICD-10-CM | POA: Diagnosis not present

## 2022-06-14 ENCOUNTER — Inpatient Hospital Stay: Payer: Medicare Other | Admitting: Oncology

## 2022-06-14 ENCOUNTER — Inpatient Hospital Stay: Payer: Medicare Other

## 2022-06-14 DIAGNOSIS — N1832 Chronic kidney disease, stage 3b: Secondary | ICD-10-CM | POA: Diagnosis not present

## 2022-06-14 DIAGNOSIS — M6281 Muscle weakness (generalized): Secondary | ICD-10-CM | POA: Diagnosis not present

## 2022-06-14 DIAGNOSIS — R296 Repeated falls: Secondary | ICD-10-CM | POA: Diagnosis not present

## 2022-06-14 DIAGNOSIS — G301 Alzheimer's disease with late onset: Secondary | ICD-10-CM | POA: Diagnosis not present

## 2022-06-14 DIAGNOSIS — R2681 Unsteadiness on feet: Secondary | ICD-10-CM | POA: Diagnosis not present

## 2022-06-14 DIAGNOSIS — R278 Other lack of coordination: Secondary | ICD-10-CM | POA: Diagnosis not present

## 2022-06-17 DIAGNOSIS — N1832 Chronic kidney disease, stage 3b: Secondary | ICD-10-CM | POA: Diagnosis not present

## 2022-06-17 DIAGNOSIS — F330 Major depressive disorder, recurrent, mild: Secondary | ICD-10-CM | POA: Diagnosis not present

## 2022-06-17 DIAGNOSIS — R2681 Unsteadiness on feet: Secondary | ICD-10-CM | POA: Diagnosis not present

## 2022-06-17 DIAGNOSIS — R296 Repeated falls: Secondary | ICD-10-CM | POA: Diagnosis not present

## 2022-06-17 DIAGNOSIS — M6281 Muscle weakness (generalized): Secondary | ICD-10-CM | POA: Diagnosis not present

## 2022-06-17 DIAGNOSIS — G301 Alzheimer's disease with late onset: Secondary | ICD-10-CM | POA: Diagnosis not present

## 2022-06-17 DIAGNOSIS — R278 Other lack of coordination: Secondary | ICD-10-CM | POA: Diagnosis not present

## 2022-06-18 ENCOUNTER — Non-Acute Institutional Stay: Payer: Medicare Other | Admitting: Nurse Practitioner

## 2022-06-18 ENCOUNTER — Encounter: Payer: Self-pay | Admitting: Nurse Practitioner

## 2022-06-18 DIAGNOSIS — Z515 Encounter for palliative care: Secondary | ICD-10-CM

## 2022-06-18 DIAGNOSIS — R634 Abnormal weight loss: Secondary | ICD-10-CM | POA: Diagnosis not present

## 2022-06-18 DIAGNOSIS — F0393 Unspecified dementia, unspecified severity, with mood disturbance: Secondary | ICD-10-CM

## 2022-06-18 DIAGNOSIS — E440 Moderate protein-calorie malnutrition: Secondary | ICD-10-CM

## 2022-06-18 NOTE — Progress Notes (Signed)
[facility] Consult Note Telephone: [phone]  Fax: [phone]    Date of encounter: 06/18/22 2:35 PM PATIENT NAME: Laura Wilcox [id number] [place] [address]   [phone] (home)  DOB: 1927/03/09 MRN: [id number] PRIMARY CARE PROVIDER:    [facility]; [facility] RESPONSIBLE PARTY:    Contact Information     Name Relation Home Work Mobile   Barnard Daughter   [phone]   TAJANAE, SARE   [phone]   watts,sherwood Relative   [phone]     I met face to face with patient in facility. Palliative Care was asked to follow this patient by consultation request of  [facility] to address advance care planning and complex medical decision making. This is the initial visit.      ASSESSMENT AND PLAN / RECOMMENDATIONS:  Symptom Management/Plan: 1. Advance Care Planning;  ongoing discussions; will re-visit code status with next family meeting (cancelled today by Baltazar Najjar, son unavailable)  Discussed at length with Laura Wilcox and currently not ready to stop Veltassa which she would require to do for hospice services, continue to monitor on PC  2. Goals of Care: Goals include to maximize quality of life and symptom management. Our advance care planning conversation included a discussion about:    The value and importance of advance care planning  Exploration of personal, cultural or spiritual beliefs that might influence medical decisions  Exploration of goals of care in the event of a sudden injury or illness  Identification and preparation of a healthcare agent  Review and updating or creation of an advance directive document. 3. Palliative care encounter; Palliative care encounter; Palliative medicine team will continue to support patient, patient's family, and medical team. Visit consisted of counseling and education dealing with the complex and emotionally intense issues  of symptom management and palliative care in the setting of serious and potentially life-threatening illness   4. Dementia progressive, ongoing decline, cognitively, functionally. Mood swings contininues; continue to be followed by Psychiatry   5. Anorexia, protein calorie malnutrition. Gain this month Reviewed weights, continue to monitor weights, continue to encourage to eat, may benefit from appetite stimulant though also possible pill burden. Encourage supplements.  12/17/2021 weight 118 lbs 04/02/2022 weight 122.9 lbs 05/17/2022 weight 126 lbs 06/15/2022 weight 116 lbs 10 lbs loss; 4 weeks; 7.94% Follow up Palliative Care Visit: PC f/u visit further discussion monitor trends of appetite, weights, monitor for functional, cognitive decline with chronic disease progression, assess any active symptoms, supportive role.Palliative care will continue to follow for complex medical decision making, advance care planning, and clarification of goals. Return 4 to 8 weeks or prn.   I spent 61 minutes providing this consultation. More than 50% of the time in this consultation was spent in counseling and care coordination. PPS: 50% Chief Complaint: Initial palliative consult for complex medical decision making, address goals, manage ongoing symptoms   HISTORY OF PRESENT ILLNESS:  Laura Wilcox is a 89 y.o. year old female  with multiple medical problems including Dementia, polycythemia vera and secondary myelofibrosis taking jakafi, anemia of chronic disease, CKD, h/o UTI's, depression, protein calorie malnutrition. Laura Wilcox resides at [facility]. Laura Wilcox is ambulatory per staff, no recent falls. Laura Wilcox requires assistance with adl's, able to toilet herself with some incontience episodes. Laura Wilcox feeds herself with some prompting, deceased appetite.  Purpose of today PC f/u visit further discussion monitor trends of appetite, weights, monitor for functional, cognitive decline with  chronic disease  progression, assess any active symptoms, supportive role. At present Laura Wilcox is lying in bed sleeping. Laura Wilcox does wake to verbal cues, makes eye contact, repeats asked what time it was. We talked about ros able to answer simple questions, though not able to retain, forgetful. Laura Wilcox was cooperative with assessment, asked if it was time to eat if she needed to get up. Laura Wilcox was able to look at the clock and tell time. We talked about what she does at the facility though limited with cognitive impairment, redirected multiple times through pc visit. Support provided. Laura Laroche Laura Allinson's daughter called, update shared, we were suppose to have family meeting with her brother, Baltazar Najjar though unavailable. We talked about pc visit, symptoms, weight loss, also goc, revisited option of hospice. Laura Wilcox endorses she and her brother were not ready to stop veltassa at this time. We talked about her functional and cognitive abilities, quality of life. Support provided, will continue to monitor on pc.    Support provided.  History obtained from review of EMR, discussion with primary team, and interview with family, facility staff/caregiver and/or Laura. Laura Wilcox.  I reviewed available labs, medications, imaging, studies and related documents from the EMR.  Records reviewed and summarized above.    Physical Exam: General: frail appearing, thin, confused female ENMT: oral mucous membranes moist CV: S1S2, RRR Pulmonary: LCTA MSK: ambulatory Neuro:  + generalized weakness,  + cognitive impairment Psych: non-anxious affect, Alert, confused  Thank you for the opportunity to participate in the care of Laura. Laura Wilcox. Please call our office at [phone] if we can be of additional assistance.   Breven Guidroz Ihor Gully, NP

## 2022-06-19 DIAGNOSIS — E441 Mild protein-calorie malnutrition: Secondary | ICD-10-CM | POA: Diagnosis not present

## 2022-06-19 DIAGNOSIS — R278 Other lack of coordination: Secondary | ICD-10-CM | POA: Diagnosis not present

## 2022-06-19 DIAGNOSIS — F330 Major depressive disorder, recurrent, mild: Secondary | ICD-10-CM | POA: Diagnosis not present

## 2022-06-19 DIAGNOSIS — R269 Unspecified abnormalities of gait and mobility: Secondary | ICD-10-CM | POA: Diagnosis not present

## 2022-06-19 DIAGNOSIS — F02B4 Dementia in other diseases classified elsewhere, moderate, with anxiety: Secondary | ICD-10-CM | POA: Diagnosis not present

## 2022-06-19 DIAGNOSIS — R296 Repeated falls: Secondary | ICD-10-CM | POA: Diagnosis not present

## 2022-06-19 DIAGNOSIS — N1832 Chronic kidney disease, stage 3b: Secondary | ICD-10-CM | POA: Diagnosis not present

## 2022-06-19 DIAGNOSIS — R2681 Unsteadiness on feet: Secondary | ICD-10-CM | POA: Diagnosis not present

## 2022-06-19 DIAGNOSIS — D45 Polycythemia vera: Secondary | ICD-10-CM | POA: Diagnosis not present

## 2022-06-19 DIAGNOSIS — M6281 Muscle weakness (generalized): Secondary | ICD-10-CM | POA: Diagnosis not present

## 2022-06-19 DIAGNOSIS — G301 Alzheimer's disease with late onset: Secondary | ICD-10-CM | POA: Diagnosis not present

## 2022-06-20 DIAGNOSIS — R2681 Unsteadiness on feet: Secondary | ICD-10-CM | POA: Diagnosis not present

## 2022-06-20 DIAGNOSIS — M6281 Muscle weakness (generalized): Secondary | ICD-10-CM | POA: Diagnosis not present

## 2022-06-20 DIAGNOSIS — R296 Repeated falls: Secondary | ICD-10-CM | POA: Diagnosis not present

## 2022-06-20 DIAGNOSIS — G301 Alzheimer's disease with late onset: Secondary | ICD-10-CM | POA: Diagnosis not present

## 2022-06-20 DIAGNOSIS — R278 Other lack of coordination: Secondary | ICD-10-CM | POA: Diagnosis not present

## 2022-06-20 DIAGNOSIS — N1832 Chronic kidney disease, stage 3b: Secondary | ICD-10-CM | POA: Diagnosis not present

## 2022-06-24 DIAGNOSIS — R296 Repeated falls: Secondary | ICD-10-CM | POA: Diagnosis not present

## 2022-06-24 DIAGNOSIS — R278 Other lack of coordination: Secondary | ICD-10-CM | POA: Diagnosis not present

## 2022-06-24 DIAGNOSIS — G301 Alzheimer's disease with late onset: Secondary | ICD-10-CM | POA: Diagnosis not present

## 2022-06-24 DIAGNOSIS — N1832 Chronic kidney disease, stage 3b: Secondary | ICD-10-CM | POA: Diagnosis not present

## 2022-06-24 DIAGNOSIS — R2681 Unsteadiness on feet: Secondary | ICD-10-CM | POA: Diagnosis not present

## 2022-06-24 DIAGNOSIS — M6281 Muscle weakness (generalized): Secondary | ICD-10-CM | POA: Diagnosis not present

## 2022-06-26 ENCOUNTER — Other Ambulatory Visit (HOSPITAL_COMMUNITY): Payer: Self-pay

## 2022-06-26 ENCOUNTER — Inpatient Hospital Stay: Payer: Medicare Other

## 2022-06-26 DIAGNOSIS — S91001A Unspecified open wound, right ankle, initial encounter: Secondary | ICD-10-CM | POA: Diagnosis not present

## 2022-06-26 DIAGNOSIS — R269 Unspecified abnormalities of gait and mobility: Secondary | ICD-10-CM | POA: Diagnosis not present

## 2022-06-26 DIAGNOSIS — R296 Repeated falls: Secondary | ICD-10-CM | POA: Diagnosis not present

## 2022-06-26 DIAGNOSIS — I739 Peripheral vascular disease, unspecified: Secondary | ICD-10-CM | POA: Diagnosis not present

## 2022-06-26 DIAGNOSIS — L03115 Cellulitis of right lower limb: Secondary | ICD-10-CM | POA: Diagnosis not present

## 2022-06-26 DIAGNOSIS — F02B4 Dementia in other diseases classified elsewhere, moderate, with anxiety: Secondary | ICD-10-CM | POA: Diagnosis not present

## 2022-06-26 DIAGNOSIS — M6281 Muscle weakness (generalized): Secondary | ICD-10-CM | POA: Diagnosis not present

## 2022-06-26 DIAGNOSIS — R2681 Unsteadiness on feet: Secondary | ICD-10-CM | POA: Diagnosis not present

## 2022-06-26 DIAGNOSIS — R278 Other lack of coordination: Secondary | ICD-10-CM | POA: Diagnosis not present

## 2022-06-26 DIAGNOSIS — G301 Alzheimer's disease with late onset: Secondary | ICD-10-CM | POA: Diagnosis not present

## 2022-06-26 DIAGNOSIS — N1832 Chronic kidney disease, stage 3b: Secondary | ICD-10-CM | POA: Diagnosis not present

## 2022-07-01 DIAGNOSIS — N1832 Chronic kidney disease, stage 3b: Secondary | ICD-10-CM | POA: Diagnosis not present

## 2022-07-01 DIAGNOSIS — R296 Repeated falls: Secondary | ICD-10-CM | POA: Diagnosis not present

## 2022-07-01 DIAGNOSIS — R2681 Unsteadiness on feet: Secondary | ICD-10-CM | POA: Diagnosis not present

## 2022-07-01 DIAGNOSIS — R278 Other lack of coordination: Secondary | ICD-10-CM | POA: Diagnosis not present

## 2022-07-01 DIAGNOSIS — M6281 Muscle weakness (generalized): Secondary | ICD-10-CM | POA: Diagnosis not present

## 2022-07-01 DIAGNOSIS — G301 Alzheimer's disease with late onset: Secondary | ICD-10-CM | POA: Diagnosis not present

## 2022-07-02 ENCOUNTER — Inpatient Hospital Stay: Payer: Medicare Other

## 2022-07-02 ENCOUNTER — Inpatient Hospital Stay: Payer: Medicare Other | Admitting: Oncology

## 2022-07-03 DIAGNOSIS — N1832 Chronic kidney disease, stage 3b: Secondary | ICD-10-CM | POA: Diagnosis not present

## 2022-07-03 DIAGNOSIS — M6281 Muscle weakness (generalized): Secondary | ICD-10-CM | POA: Diagnosis not present

## 2022-07-03 DIAGNOSIS — R296 Repeated falls: Secondary | ICD-10-CM | POA: Diagnosis not present

## 2022-07-03 DIAGNOSIS — R2681 Unsteadiness on feet: Secondary | ICD-10-CM | POA: Diagnosis not present

## 2022-07-03 DIAGNOSIS — G301 Alzheimer's disease with late onset: Secondary | ICD-10-CM | POA: Diagnosis not present

## 2022-07-03 DIAGNOSIS — R278 Other lack of coordination: Secondary | ICD-10-CM | POA: Diagnosis not present

## 2022-07-05 ENCOUNTER — Non-Acute Institutional Stay: Payer: Medicare Other | Admitting: Nurse Practitioner

## 2022-07-05 ENCOUNTER — Encounter: Payer: Self-pay | Admitting: Nurse Practitioner

## 2022-07-05 VITALS — BP 118/69 | HR 69 | Resp 18 | Wt 119.8 lb

## 2022-07-05 DIAGNOSIS — F0393 Unspecified dementia, unspecified severity, with mood disturbance: Secondary | ICD-10-CM | POA: Diagnosis not present

## 2022-07-05 DIAGNOSIS — E440 Moderate protein-calorie malnutrition: Secondary | ICD-10-CM

## 2022-07-05 DIAGNOSIS — Z515 Encounter for palliative care: Secondary | ICD-10-CM

## 2022-07-05 NOTE — Progress Notes (Addendum)
[facility] Consult Note Telephone: [phone]  Fax: [phone]    Date of encounter: 07/05/22 3:22 PM PATIENT NAME: Laura Wilcox [address] [place] [address]   [phone] (home)  DOB: [birth year] MRN: [id number] PRIMARY CARE PROVIDER:    [facility]: [facility]ed [facility] Care Unit  RESPONSIBLE PARTY:    Contact Information     Name Relation Home Work Mobile   Laura Wilcox Daughter   [phone]   Laura Wilcox, Laura Wilcox   [phone]   Laura Wilcox,Laura Wilcox Relative   [phone]         I met face to face with patient in facility. Palliative Care was asked to follow this patient by consultation request of  [facility] to address advance care planning and complex medical decision making. This is the initial visit.      ASSESSMENT AND PLAN / RECOMMENDATIONS:  Symptom Management/Plan: 1. Advance Care Planning;  ongoing discussions;  2. Palliative care encounter; Palliative care encounter; Palliative medicine team will continue to support patient, patient's family, and medical team. Visit consisted of counseling and education dealing with the complex and emotionally intense issues of symptom management and palliative care in the setting of serious and potentially life-threatening illness   3. Dementia progressive, ongoing decline, cognitively, functionally. Mood swings contininues; continue to be followed by Psychiatry   4. Anorexia, protein calorie malnutrition. Gain this month Reviewed weights, continue to monitor weights, continue to encourage to eat, may benefit from appetite stimulant though also possible pill burden. Encourage supplements.  12/17/2021 weight 118 lbs 04/02/2022 weight 122.9 lbs 05/17/2022 weight 126 lbs 06/15/2022 weight 116 lbs 07/01/2022 weight 119.4 lbs  Follow up Palliative Care Visit: PC f/u visit further discussion monitor trends of appetite, weights, monitor for functional,  cognitive decline with chronic disease progression, assess any active symptoms, supportive role.Palliative care will continue to follow for complex medical decision making, advance care planning, and clarification of goals. Return 4 to 8 weeks or prn.   I spent 41 minutes providing this consultation starting at 10:00 am. More than 50% of the time in this consultation was spent in counseling and care coordination. PPS: 50% Chief Complaint: Initial palliative consult for complex medical decision making, address goals, manage ongoing symptoms   HISTORY OF PRESENT ILLNESS:  Laura Wilcox is a 89 y.o. year old female  with multiple medical problems including Dementia, polycythemia vera and secondary myelofibrosis taking jakafi, anemia of chronic disease, CKD, h/o UTI's, depression, protein calorie malnutrition. Purpose of today PC f/u visit further discussion monitor trends of appetite, weights, monitor for functional, cognitive decline with chronic disease progression, assess any active symptoms, supportive role. Laura Wilcox resides at [facility]. Laura Wilcox is ambulatory per staff, no recent falls. Laura Wilcox requires assistance with adl's, able to toilet herself with some incontience episodes. Laura Wilcox feeds herself with some prompting, deceased appetite.  Purpose of today PC f/u visit further discussion monitor trends of appetite, weights, monitor for functional, cognitive decline with chronic disease progression, assess any active symptoms, supportive role. At present Laura Wilcox is lying in bed sleeping. Laura Wilcox does wake to verbal cues, makes eye contact. Laura Wilcox was cooperative with assessment, though limited with cognitive impairment and falling asleep during pc visit. Attempted to contact dtg. Reviewed medications, goc, poc, discussion with staff. Continue current poc. PC f/u visit further discussion monitor trends of appetite, weights, monitor for functional, cognitive decline with chronic  disease progression, assess any active symptoms, supportive role.  Support provided.  History obtained from review of EMR, discussion with primary team, and interview with family, facility staff/caregiver and/or Laura Wilcox.  I reviewed available labs, medications, imaging, studies and related documents from the EMR.  Records reviewed and summarized above.    Physical Exam: General: frail appearing, thin, confused female ENMT: oral mucous membranes moist CV: S1S2, RRR Pulmonary: LCTA MSK: ambulatory Neuro:  + generalized weakness,  + cognitive impairment Psych: non-anxious affect, Alert, confused    Thank you for the opportunity to participate in the care of Laura Wilcox. Please call our office at [phone] if we can be of additional assistance.   Raziyah Vanvleck [place], NP

## 2022-07-06 DIAGNOSIS — S80921D Unspecified superficial injury of right lower leg, subsequent encounter: Secondary | ICD-10-CM | POA: Diagnosis not present

## 2022-07-06 DIAGNOSIS — I739 Peripheral vascular disease, unspecified: Secondary | ICD-10-CM | POA: Diagnosis not present

## 2022-07-06 DIAGNOSIS — Z9181 History of falling: Secondary | ICD-10-CM | POA: Diagnosis not present

## 2022-07-06 DIAGNOSIS — Z7982 Long term (current) use of aspirin: Secondary | ICD-10-CM | POA: Diagnosis not present

## 2022-07-06 DIAGNOSIS — G301 Alzheimer's disease with late onset: Secondary | ICD-10-CM | POA: Diagnosis not present

## 2022-07-06 DIAGNOSIS — F02B4 Dementia in other diseases classified elsewhere, moderate, with anxiety: Secondary | ICD-10-CM | POA: Diagnosis not present

## 2022-07-06 DIAGNOSIS — L03115 Cellulitis of right lower limb: Secondary | ICD-10-CM | POA: Diagnosis not present

## 2022-07-06 DIAGNOSIS — S90521D Blister (nonthermal), right ankle, subsequent encounter: Secondary | ICD-10-CM | POA: Diagnosis not present

## 2022-07-08 ENCOUNTER — Inpatient Hospital Stay: Payer: Medicare Other

## 2022-07-08 ENCOUNTER — Other Ambulatory Visit: Payer: Self-pay | Admitting: Oncology

## 2022-07-08 ENCOUNTER — Inpatient Hospital Stay: Payer: Medicare Other | Attending: Oncology

## 2022-07-08 ENCOUNTER — Telehealth: Payer: Self-pay

## 2022-07-08 VITALS — BP 137/67 | HR 62 | Resp 20

## 2022-07-08 DIAGNOSIS — Z803 Family history of malignant neoplasm of breast: Secondary | ICD-10-CM | POA: Insufficient documentation

## 2022-07-08 DIAGNOSIS — Z881 Allergy status to other antibiotic agents status: Secondary | ICD-10-CM | POA: Insufficient documentation

## 2022-07-08 DIAGNOSIS — E876 Hypokalemia: Secondary | ICD-10-CM

## 2022-07-08 DIAGNOSIS — Z87440 Personal history of urinary (tract) infections: Secondary | ICD-10-CM | POA: Diagnosis not present

## 2022-07-08 DIAGNOSIS — D631 Anemia in chronic kidney disease: Secondary | ICD-10-CM | POA: Diagnosis not present

## 2022-07-08 DIAGNOSIS — D45 Polycythemia vera: Secondary | ICD-10-CM | POA: Insufficient documentation

## 2022-07-08 DIAGNOSIS — Z7982 Long term (current) use of aspirin: Secondary | ICD-10-CM | POA: Insufficient documentation

## 2022-07-08 DIAGNOSIS — Z90710 Acquired absence of both cervix and uterus: Secondary | ICD-10-CM | POA: Insufficient documentation

## 2022-07-08 DIAGNOSIS — R4189 Other symptoms and signs involving cognitive functions and awareness: Secondary | ICD-10-CM | POA: Diagnosis not present

## 2022-07-08 DIAGNOSIS — Z818 Family history of other mental and behavioral disorders: Secondary | ICD-10-CM | POA: Diagnosis not present

## 2022-07-08 DIAGNOSIS — Z8249 Family history of ischemic heart disease and other diseases of the circulatory system: Secondary | ICD-10-CM | POA: Insufficient documentation

## 2022-07-08 DIAGNOSIS — F0390 Unspecified dementia without behavioral disturbance: Secondary | ICD-10-CM | POA: Diagnosis not present

## 2022-07-08 DIAGNOSIS — N179 Acute kidney failure, unspecified: Secondary | ICD-10-CM | POA: Insufficient documentation

## 2022-07-08 DIAGNOSIS — R531 Weakness: Secondary | ICD-10-CM | POA: Diagnosis not present

## 2022-07-08 DIAGNOSIS — Z85828 Personal history of other malignant neoplasm of skin: Secondary | ICD-10-CM | POA: Diagnosis not present

## 2022-07-08 DIAGNOSIS — Z79899 Other long term (current) drug therapy: Secondary | ICD-10-CM | POA: Diagnosis not present

## 2022-07-08 DIAGNOSIS — D471 Chronic myeloproliferative disease: Secondary | ICD-10-CM | POA: Diagnosis not present

## 2022-07-08 DIAGNOSIS — D709 Neutropenia, unspecified: Secondary | ICD-10-CM | POA: Insufficient documentation

## 2022-07-08 LAB — COMPREHENSIVE METABOLIC PANEL
ALT: 16 U/L (ref 0–44)
AST: 22 U/L (ref 15–41)
Albumin: 3.4 g/dL — ABNORMAL LOW (ref 3.5–5.0)
Alkaline Phosphatase: 93 U/L (ref 38–126)
Anion gap: 9 (ref 5–15)
BUN: 37 mg/dL — ABNORMAL HIGH (ref 8–23)
CO2: 24 mmol/L (ref 22–32)
Calcium: 8.8 mg/dL — ABNORMAL LOW (ref 8.9–10.3)
Chloride: 103 mmol/L (ref 98–111)
Creatinine, Ser: 1.44 mg/dL — ABNORMAL HIGH (ref 0.44–1.00)
GFR, Estimated: 34 mL/min — ABNORMAL LOW (ref >60)
Glucose, Bld: 196 mg/dL — ABNORMAL HIGH (ref 70–99)
Potassium: 2.7 mmol/L — CL (ref 3.5–5.1)
Sodium: 136 mmol/L (ref 135–145)
Total Bilirubin: 0.4 mg/dL (ref 0.3–1.2)
Total Protein: 6.2 g/dL — ABNORMAL LOW (ref 6.5–8.1)

## 2022-07-08 LAB — CBC WITH DIFFERENTIAL/PLATELET
Abs Immature Granulocytes: 0.63 10*3/uL — ABNORMAL HIGH (ref 0.00–0.07)
Basophils Absolute: 0.2 10*3/uL — ABNORMAL HIGH (ref 0.0–0.1)
Basophils Relative: 2 %
Eosinophils Absolute: 0.5 10*3/uL (ref 0.0–0.5)
Eosinophils Relative: 4 %
HCT: 29 % — ABNORMAL LOW (ref 36.0–46.0)
Hemoglobin: 8.9 g/dL — ABNORMAL LOW (ref 12.0–15.0)
Immature Granulocytes: 5 %
Lymphocytes Relative: 18 %
Lymphs Abs: 2.2 10*3/uL (ref 0.7–4.0)
MCH: 29.6 pg (ref 26.0–34.0)
MCHC: 30.7 g/dL (ref 30.0–36.0)
MCV: 96.3 fL (ref 80.0–100.0)
Monocytes Absolute: 1.1 10*3/uL — ABNORMAL HIGH (ref 0.1–1.0)
Monocytes Relative: 9 %
Neutro Abs: 7.6 10*3/uL (ref 1.7–7.7)
Neutrophils Relative %: 62 %
Platelets: 719 10*3/uL — ABNORMAL HIGH (ref 150–400)
RBC: 3.01 MIL/uL — ABNORMAL LOW (ref 3.87–5.11)
RDW: 22.7 % — ABNORMAL HIGH (ref 11.5–15.5)
WBC: 12.2 10*3/uL — ABNORMAL HIGH (ref 4.0–10.5)
nRBC: 0.3 % — ABNORMAL HIGH (ref 0.0–0.2)

## 2022-07-08 LAB — IRON AND TIBC
Iron: 61 ug/dL (ref 28–170)
Saturation Ratios: 30 % (ref 10.4–31.8)
TIBC: 206 ug/dL — ABNORMAL LOW (ref 250–450)
UIBC: 145 ug/dL

## 2022-07-08 LAB — FERRITIN: Ferritin: 406 ng/mL — ABNORMAL HIGH (ref 11–307)

## 2022-07-08 MED ORDER — POTASSIUM CHLORIDE CRYS ER 20 MEQ PO TBCR: 20.0000 meq | EXTENDED_RELEASE_TABLET | Freq: Two times a day (BID) | ORAL | 0 refills | Status: DC

## 2022-07-08 MED ORDER — POTASSIUM CHLORIDE 20 MEQ/100ML IV SOLN: 20.0000 meq | Freq: Once | INTRAVENOUS | Status: DC

## 2022-07-08 MED ORDER — SODIUM CHLORIDE 0.9 % IV SOLN
20.0000 meq | Freq: Once | INTRAVENOUS | Status: AC
  Administered 2022-07-08: 20 meq via INTRAVENOUS
  Filled 2022-07-08: qty 10

## 2022-07-08 NOTE — Telephone Encounter (Signed)
Critical Value message received from [place].  K+= 2.7.   Pt sheduled to come back to clinic today for K. Potassium rx sent to [facility] and new rx directoins faxed to [facility]. Lab(bmp) and poss IVF K added to appt on 4/10. Pt's daughter Bethann Berkshire informed of this.

## 2022-07-10 ENCOUNTER — Inpatient Hospital Stay: Payer: Medicare Other

## 2022-07-10 ENCOUNTER — Inpatient Hospital Stay (HOSPITAL_BASED_OUTPATIENT_CLINIC_OR_DEPARTMENT_OTHER): Payer: Medicare Other | Admitting: Oncology

## 2022-07-10 ENCOUNTER — Encounter: Payer: Self-pay | Admitting: Oncology

## 2022-07-10 VITALS — BP 111/51 | HR 76 | Temp 97.0°F | Wt 122.5 lb

## 2022-07-10 DIAGNOSIS — D471 Chronic myeloproliferative disease: Secondary | ICD-10-CM | POA: Diagnosis not present

## 2022-07-10 DIAGNOSIS — G301 Alzheimer's disease with late onset: Secondary | ICD-10-CM | POA: Diagnosis not present

## 2022-07-10 DIAGNOSIS — D45 Polycythemia vera: Secondary | ICD-10-CM

## 2022-07-10 DIAGNOSIS — F02B4 Dementia in other diseases classified elsewhere, moderate, with anxiety: Secondary | ICD-10-CM | POA: Diagnosis not present

## 2022-07-10 DIAGNOSIS — I739 Peripheral vascular disease, unspecified: Secondary | ICD-10-CM | POA: Diagnosis not present

## 2022-07-10 DIAGNOSIS — D709 Neutropenia, unspecified: Secondary | ICD-10-CM | POA: Diagnosis not present

## 2022-07-10 DIAGNOSIS — E876 Hypokalemia: Secondary | ICD-10-CM

## 2022-07-10 DIAGNOSIS — D7581 Myelofibrosis: Secondary | ICD-10-CM | POA: Diagnosis not present

## 2022-07-10 DIAGNOSIS — S81801A Unspecified open wound, right lower leg, initial encounter: Secondary | ICD-10-CM | POA: Diagnosis not present

## 2022-07-10 DIAGNOSIS — N1832 Chronic kidney disease, stage 3b: Secondary | ICD-10-CM | POA: Diagnosis not present

## 2022-07-10 DIAGNOSIS — D631 Anemia in chronic kidney disease: Secondary | ICD-10-CM | POA: Diagnosis not present

## 2022-07-10 DIAGNOSIS — F0390 Unspecified dementia without behavioral disturbance: Secondary | ICD-10-CM | POA: Diagnosis not present

## 2022-07-10 DIAGNOSIS — S91001D Unspecified open wound, right ankle, subsequent encounter: Secondary | ICD-10-CM | POA: Diagnosis not present

## 2022-07-10 DIAGNOSIS — L03115 Cellulitis of right lower limb: Secondary | ICD-10-CM | POA: Diagnosis not present

## 2022-07-10 LAB — BASIC METABOLIC PANEL - CANCER CENTER ONLY
Anion gap: 8 (ref 5–15)
BUN: 31 mg/dL — ABNORMAL HIGH (ref 8–23)
CO2: 23 mmol/L (ref 22–32)
Calcium: 8.4 mg/dL — ABNORMAL LOW (ref 8.9–10.3)
Chloride: 104 mmol/L (ref 98–111)
Creatinine: 1.43 mg/dL — ABNORMAL HIGH (ref 0.44–1.00)
GFR, Estimated: 34 mL/min — ABNORMAL LOW (ref >60)
Glucose, Bld: 195 mg/dL — ABNORMAL HIGH (ref 70–99)
Potassium: 3.4 mmol/L — ABNORMAL LOW (ref 3.5–5.1)
Sodium: 135 mmol/L (ref 135–145)

## 2022-07-10 MED ORDER — EPOETIN ALFA-EPBX 10000 UNIT/ML IJ SOLN
20000.0000 [IU] | Freq: Once | INTRAMUSCULAR | Status: AC
  Administered 2022-07-10: 20000 [IU] via SUBCUTANEOUS
  Filled 2022-07-10: qty 2

## 2022-07-10 NOTE — Progress Notes (Signed)
Hematology/Oncology Progress note [phone] [phone]     ASSESSMENT & PLAN:   Myelofibrosis (HCC) Polycythemia vera and  secondary myelofibrosis. Labs are reviewed and discussed with patient. Platelet count is in 700s range, due to cytopenia, I recommend to continue same regimen Jakafi 5 mg in the morning, 5 mg in the evening.   Continue aspirin 81 mg daily.  Anemia in chronic kidney disease Anemia due to CKD, and myelofibrosis.   Hemoglobin has decreased, proceed with Retacrit 20,000 units.   Hypokalemia Potassium was low at 2.7, likely due to potassium binder which is on hold now.  On potassium supplementation, recommend to continue twice daily until 4/11,discontinue after that.  Follow up with nephrology  Orders Placed This Encounter  Procedures   CBC with Differential (Cancer Center Only)    Standing Status:   Standing    Number of Occurrences:   2    Standing Expiration Date:   07/10/2023   CBC with Differential (Cancer Center Only)    Standing Status:   Future    Standing Expiration Date:   07/10/2023   CMP (Cancer Center only)    Standing Status:   Future    Standing Expiration Date:   07/10/2023   Follow up  Labs in 4 weeks, 8 weeks -cbc +/- retacrit  Follow up in 12 weeks, lab prior to MD +/- retacrit.   All questions were answered. The patient knows to call the clinic with any problems, questions or concerns.  Rickard Patience, MD, PhD [facility] Hematology Oncology 07/10/2022      Chief Complaint: Laura Wilcox is a 89 y.o. female with polycythemia vera (PV) and secondary myelofibrosis presents for follow up   PERTINENT ONCOLOGY HISTORY Laura Wilcox is a 89 y.o.afemale who has above oncology history reviewed by me today presented for follow up visit for management of  polycythemia vera  Patient previously followed up by Dr.Corcoran, patient switched care to me on 11/16/20 Extensive medical record review was performed by  me  Feb 2014 polycythemia vera and secondary myelofibrosis.  She presented in 04/2002 with thrombocytosis (835,000).  JAK2 V617F was positive   Bone marrow was on 04/15/2002 revealed a normocellular marrow for age with trilineage hematopoiesis and mild megakaryocytic hyperplasia with focal atypia.  There was no morphologic evidence of a myeloproliferative disorder.  Iron stains were inadequate for evaluation of storage iron.  There was no increase in reticulin fibers.  She was started on Agrylin in 2012  Bone marrow on 09/25/2012 revealed persistent myeloproliferative disorder s/p Agrylin.  The current features were best classified as persistent myeloproliferative neoplasm (MPN) JAK2 V617F mutation positive.  The current features of markedly increased atypical megakaryocytes paresis, with frequent clustering with frequent hyper/deeply lobulated forms, bizarre forms and forms with staghorn nuclei, hyperchromatic forms and abundant cytoplasm was highly suggestive of a myeloproliferative neoplasm.  Marrow was hypercellular for age (30-50%) with increased trilineage hematopoiesis and markedly increased atypical megakaryopoiesis with frequent clustering.  There were no increased blasts.  Iron stores were absent.  There was moderate to severe myelofibrosis (grade MF 2-3).  Differential includes ET, PV, primary myelofibrosis, and MPN, unclassifiable.  Cytogenetics were normal (46, XX). She developed pancytopenia in 12/2015.  Etiology is unclear (medication confusion or hydroxyurea + Agrylin).   She received Procrit 40,000 units (intermittently 09/2012 - 06/2016), Venofer (09/2012 - 10/2012), and Infed (12/2015 - 03/2016).  She received Neupogen during a period of neutropenia.   She was anagrelide from 2004 - 08/13/2017.  She began [place] on 08/14/2017.  Jakafi was increased from 5 mg BID to 10 mg BID on 06/17/2018.  Jakafi was increased to 15 mg BID on 08/03/2018.  She has been back on Jakafi 5 mg BID since  02/01/2019 then 10 mg BID on 05/25/2019.  Earvin Hansen was on hold from 12/20/2019 - 01/12/2020.  Jakafi 5 mg twice daily restarted on 01/12/2020 then increased to 10 mg in the morning and 5 mg in the evening on 02/02/2020. Earvin Hansen is currently 20 mg po BID.  She has received Retacrit to maintain a hemoglobin > 10 (12/24/2019 and 03   Squamous cell carcinoma left cheek- s/p cryotherapy and debridement. Managed by Dr. Gwen Pounds  10/22/2020- 10/26/2020 Hospitalization due to acute metabolic encephalopathy, UTI,E. coli bacteremia AKI on CKD, acute on chronic anemia. Earvin Hansen was held during the admission. She was discharged and spent a short period of time in rehab. Alta Corning was held.   11/06/2020 seen by symptom management for weakness.  11/16/2020 established care with me. Resumed Jakafi at 10mg  BID  #Dementia/cognitive impairment- patient has hx of dementia. Per daughter, it has been difficult to convince patent to see neurology for further evaluation. She is not on any medication for her memory loss.  She has been referred to home health.  06/25/2021 - 07/02/2021,Patient was hospitalized due to UTI, generalized weakness Jakafi was.  Held. 07/25/2021, resumed on Jakafi 5 mg daily.  07/25/2021, bilateral lower extremity ultrasound were negative for DVT. 08/20/2021, increased Jakafi to 5 mg twice daily. INTERVAL HISTORY Laura Wilcox is a 89 y.o. female who has above history reviewed by me today presents for follow up visit for management of polycythemia vera and secondary myelofibrosis.   Patient was accompanied by daughter Laura Wilcox  + fatigue  Takes potassium BID as instructed. Potassium binder is on hold.   Past Medical History:  Diagnosis Date   Acute metabolic encephalopathy 10/22/2020   Confusion 12/30/2018   Polycythemia    SCC (squamous cell carcinoma) 04/11/2021   R med dorsum hand - ED&C SCCIS   SCC (squamous cell carcinoma) 10/09/2021   left lateral eye, EDC   SCC (squamous cell carcinoma)  01/28/2022   left lateral eye, recurrent, EDC. 5FU/Calcipotriene cream.   SCC (squamous cell carcinoma) 01/28/2022   in situ, left medial cheek, 5FU/Calcipotriene Cr   SCC (squamous cell carcinoma) 02/26/2022   SCC IS, Right Ulnar Dorsal Hand, 5FU/Calcipotriene cream   Squamous cell carcinoma of skin 09/06/2020   R thumb - ED&C   Urinary tract infection without hematuria 01/02/2020   Urticaria 06/02/2017    Past Surgical History:  Procedure Laterality Date   ABDOMINAL HYSTERECTOMY     complete   BREAST SURGERY      Family History  Problem Relation Age of Onset   Heart disease Father    Cancer Sister        breast   Cancer Sister        breast   Alzheimer's disease Sister     Social History:  reports that she has never smoked. She has never used smokeless tobacco. She reports that she does not drink alcohol and does not use drugs.   Allergies:  Allergies  Allergen Reactions   Bactrim [Sulfamethoxazole-Trimethoprim]     Due to kidney disease    Epinephrine Other (See Comments)    Current Medications: Current Outpatient Medications  Medication Sig Dispense Refill   acetaminophen (TYLENOL) 325 MG tablet Take 325-650 mg by mouth every 6 (six) hours as needed  for mild pain or moderate pain.     aspirin EC 81 MG tablet Take 81 mg by mouth daily. Swallow whole.     calcitRIOL (ROCALTROL) 0.25 MCG capsule Take 0.25 mcg by mouth every Monday, Wednesday, and Friday.     citalopram (CELEXA) 20 MG tablet Take 1 tablet (20 mg total) by mouth daily. 30 tablet 3   feeding supplement (ENSURE ENLIVE / ENSURE PLUS) LIQD Take 237 mLs by mouth 2 (two) times daily between meals. 237 mL 12   polyethylene glycol (MIRALAX / GLYCOLAX) 17 g packet Take 17 g by mouth daily as needed for mild constipation. 14 each 0   potassium chloride SA (KLOR-CON M) 20 MEQ tablet Take 1 tablet (20 mEq total) by mouth 2 (two) times daily. 14 tablet 0   ruxolitinib phosphate (JAKAFI) 5 MG tablet Take 1 tablet (5  mg total) by mouth 2 (two) times daily. 90 tablet 2   vitamin B-12 (CYANOCOBALAMIN) 1000 MCG tablet TAKE 1 TABLET BY MOUTH DAILY 30 tablet 1   VELTASSA 16.8 g PACK Take by mouth. (Patient not taking: Reported on 05/17/2022)     No current facility-administered medications for this visit.    Review of Systems  Unable to perform ROS: Dementia    Performance status (ECOG):  2  Vitals Blood pressure (!) 111/51, pulse 76, temperature (!) 97 F (36.1 C), temperature source Tympanic, weight 122 lb 8 oz (55.6 kg).   Physical Exam Vitals reviewed.  Constitutional:      General: She is not in acute distress.    Appearance: She is well-developed.     Comments: Frail appearance, patient sits in the wheelchair  Eyes:     General: No scleral icterus.    Conjunctiva/sclera: Conjunctivae normal.  Cardiovascular:     Rate and Rhythm: Normal rate.  Pulmonary:     Effort: Pulmonary effort is normal. No respiratory distress.     Breath sounds: No wheezing.  Abdominal:     General: There is no distension.     Palpations: Abdomen is soft.     Tenderness: There is no abdominal tenderness.  Musculoskeletal:        General: No deformity. Normal range of motion.  Skin:    General: Skin is warm and dry.     Coloration: Skin is not pale.     Comments: Left cheek skin lesion  Neurological:     Mental Status: She is alert. Mental status is at baseline.  Psychiatric:        Attention and Perception: Attention normal.        Mood and Affect: Mood and affect normal.        Behavior: Behavior is cooperative.        Cognition and Memory: Cognition is impaired. Memory is impaired.    Labs    Latest Ref Rng & Units 07/08/2022    1:18 PM 05/17/2022    1:21 PM 04/02/2022    1:52 PM  CBC  WBC 4.0 - 10.5 K/uL 12.2  11.8  9.2   Hemoglobin 12.0 - 15.0 g/dL 8.9  9.1  8.7   Hematocrit 36.0 - 46.0 % 29.0  29.9  27.0   Platelets 150 - 400 K/uL 719  781  456       Latest Ref Rng & Units 07/10/2022    1:08  PM 07/08/2022    1:18 PM 04/02/2022    1:52 PM  CMP  Glucose 70 - 99 mg/dL 086195  578196  124   BUN 8 - 23 mg/dL 31  37  25   Creatinine 0.44 - 1.00 mg/dL 4.09  8.11  9.14   Sodium 135 - 145 mmol/L 135  136  139   Potassium 3.5 - 5.1 mmol/L 3.4  2.7  3.6   Chloride 98 - 111 mmol/L 104  103  101   CO2 22 - 32 mmol/L 23  24  24    Calcium 8.9 - 10.3 mg/dL 8.4  8.8  9.2   Total Protein 6.5 - 8.1 g/dL  6.2  7.0   Total Bilirubin 0.3 - 1.2 mg/dL  0.4  0.4   Alkaline Phos 38 - 126 U/L  93  102   AST 15 - 41 U/L  22  25   ALT 0 - 44 U/L  16  16

## 2022-07-10 NOTE — Assessment & Plan Note (Signed)
Polycythemia vera and  secondary myelofibrosis. Labs are reviewed and discussed with patient. Platelet count is in 700s range, due to cytopenia, I recommend to continue same regimen Jakafi 5 mg in the morning, 5 mg in the evening.   Continue aspirin 81 mg daily.

## 2022-07-10 NOTE — Assessment & Plan Note (Signed)
Anemia due to CKD, and myelofibrosis.   Hemoglobin has decreased, proceed with Retacrit 20,000 units.

## 2022-07-10 NOTE — Assessment & Plan Note (Signed)
Potassium was low at 2.7, likely due to potassium binder which is on hold now.  On potassium supplementation, recommend to continue twice daily until 4/11,discontinue after that.  Follow up with nephrology

## 2022-07-12 DIAGNOSIS — S90521D Blister (nonthermal), right ankle, subsequent encounter: Secondary | ICD-10-CM | POA: Diagnosis not present

## 2022-07-12 DIAGNOSIS — L03115 Cellulitis of right lower limb: Secondary | ICD-10-CM | POA: Diagnosis not present

## 2022-07-12 DIAGNOSIS — G301 Alzheimer's disease with late onset: Secondary | ICD-10-CM | POA: Diagnosis not present

## 2022-07-12 DIAGNOSIS — I739 Peripheral vascular disease, unspecified: Secondary | ICD-10-CM | POA: Diagnosis not present

## 2022-07-12 DIAGNOSIS — F02B4 Dementia in other diseases classified elsewhere, moderate, with anxiety: Secondary | ICD-10-CM | POA: Diagnosis not present

## 2022-07-12 DIAGNOSIS — S80921D Unspecified superficial injury of right lower leg, subsequent encounter: Secondary | ICD-10-CM | POA: Diagnosis not present

## 2022-07-15 DIAGNOSIS — S80921D Unspecified superficial injury of right lower leg, subsequent encounter: Secondary | ICD-10-CM | POA: Diagnosis not present

## 2022-07-15 DIAGNOSIS — I739 Peripheral vascular disease, unspecified: Secondary | ICD-10-CM | POA: Diagnosis not present

## 2022-07-15 DIAGNOSIS — S90521D Blister (nonthermal), right ankle, subsequent encounter: Secondary | ICD-10-CM | POA: Diagnosis not present

## 2022-07-15 DIAGNOSIS — F02B4 Dementia in other diseases classified elsewhere, moderate, with anxiety: Secondary | ICD-10-CM | POA: Diagnosis not present

## 2022-07-15 DIAGNOSIS — L03115 Cellulitis of right lower limb: Secondary | ICD-10-CM | POA: Diagnosis not present

## 2022-07-15 DIAGNOSIS — G301 Alzheimer's disease with late onset: Secondary | ICD-10-CM | POA: Diagnosis not present

## 2022-07-16 ENCOUNTER — Other Ambulatory Visit: Payer: Self-pay | Admitting: Oncology

## 2022-07-16 ENCOUNTER — Other Ambulatory Visit (HOSPITAL_COMMUNITY): Payer: Self-pay

## 2022-07-16 DIAGNOSIS — G301 Alzheimer's disease with late onset: Secondary | ICD-10-CM | POA: Diagnosis not present

## 2022-07-16 DIAGNOSIS — S80921D Unspecified superficial injury of right lower leg, subsequent encounter: Secondary | ICD-10-CM | POA: Diagnosis not present

## 2022-07-16 DIAGNOSIS — I739 Peripheral vascular disease, unspecified: Secondary | ICD-10-CM | POA: Diagnosis not present

## 2022-07-16 DIAGNOSIS — F02B4 Dementia in other diseases classified elsewhere, moderate, with anxiety: Secondary | ICD-10-CM | POA: Diagnosis not present

## 2022-07-16 DIAGNOSIS — L03115 Cellulitis of right lower limb: Secondary | ICD-10-CM | POA: Diagnosis not present

## 2022-07-16 DIAGNOSIS — S90521D Blister (nonthermal), right ankle, subsequent encounter: Secondary | ICD-10-CM | POA: Diagnosis not present

## 2022-07-16 DIAGNOSIS — D45 Polycythemia vera: Secondary | ICD-10-CM

## 2022-07-16 MED ORDER — RUXOLITINIB PHOSPHATE 5 MG PO TABS
5.0000 mg | ORAL_TABLET | Freq: Two times a day (BID) | ORAL | 2 refills | Status: DC
  Filled 2022-07-17: qty 60, 30d supply, fill #0
  Filled 2022-08-13: qty 60, 30d supply, fill #1

## 2022-07-16 NOTE — Telephone Encounter (Signed)
CBC with Differential/Platelet Order: [id number] Status: Final result     Visible to patient: Yes (seen)     Next appt: 08/07/2022 at 01:15 PM in Oncology ([facility])     Dx: Polycythemia vera   0 Result Notes          Component Ref Range & Units 8 d ago (07/08/22) 2 mo ago (05/17/22) 3 mo ago (04/02/22) 5 mo ago (01/29/22) 7 mo ago (12/17/21) 7 mo ago (11/19/21) 8 mo ago (10/22/21)  WBC 4.0 - 10.5 K/uL 12.2 High  11.8 High  9.2 9.0 11.4 High  10.2 11.1 High   RBC 3.87 - 5.11 MIL/uL 3.01 Low  3.06 Low  2.93 Low  3.70 Low  3.74 Low  3.78 Low  3.73 Low   Hemoglobin 12.0 - 15.0 g/dL 8.9 Low  9.1 Low  8.7 Low  10.5 Low  10.8 Low  10.7 Low  10.5 Low   HCT 36.0 - 46.0 % 29.0 Low  29.9 Low  27.0 Low  33.8 Low  33.9 Low  34.4 Low  34.3 Low   MCV 80.0 - 100.0 fL 96.3 97.7 92.2 91.4 90.6 91.0 92.0  MCH 26.0 - 34.0 pg 29.6 29.7 29.7 28.4 28.9 28.3 28.2  MCHC 30.0 - 36.0 g/dL 96.2 95.2 84.1 32.4 40.1 31.1 30.6  RDW 11.5 - 15.5 % 22.7 High  22.8 High  22.5 High  23.6 High  22.6 High  22.0 High  20.3 High   Platelets 150 - 400 K/uL 719 High  781 High  456 High  574 High  644 High  660 High  744 High   nRBC 0.0 - 0.2 % 0.3 High  0.4 High  0.9 High  0.2 0.0 0.2 0.0  Neutrophils Relative % % 62 56 65 57 66 54 63  Neutro Abs 1.7 - 7.7 K/uL 7.6 6.6 6.0 5.2 7.6 5.5 7.0  Lymphocytes Relative % Lymphs Abs 0.7 - 4.0 K/uL 2.2 1.9 1.3 1.7 1.5 2.3 1.8  Monocytes Relative % Monocytes Absolute 0.1 - 1.0 K/uL 1.1 High  1.5 High  1.1 High  0.9 1.1 High  1.1 High  0.8  Eosinophils Relative % Eosinophils Absolute 0.0 - 0.5 K/uL 0.5 0.5 0.3 0.4 0.5 0.6 High  0.6 High   Basophils Relative % Basophils Absolute 0.0 - 0.1 K/uL 0.2 High  0.2 High  0.1 0.2 High  0.2 High  0.2 High  0.2 High   Immature Granulocytes % Abs Immature Granulocytes 0.00 - 0.07 K/uL 0.63 High  1.05 High  CM 0.48 High  CM 0.59 High  CM  0.58 High  CM 0.60 High  CM 0.63 High   Comment: Performed at [facility], [address] [place], [place] [address]  WBC Morphology  MILD LEFT SHIFT (1-5% METAS, OCC MYELO, OCC BANDS)    OCCASIONAL MYELO'S AND META'S PRESENT MILD LEFT SHIFT (1-5% METAS, OCC MYELO, OCC BANDS) CM  RBC Morphology  MIXED RBC POPULATION     MIXED RBC POPULATION  Smear Review  PLATELETS APPEAR INCREASED CM    Normal platelet morphology CM PLATELETS APPEAR INCREASED CM  Giant PLTs       PRESENT CM  Resulting Agency [facility]         Specimen Collected: 07/08/22 13:18 Last Resulted: 07/08/22 13:31      Comprehensive metabolic panel Order: [id number] Status: Final result     Visible to patient: Yes (seen)     Next appt: 08/07/2022 at 01:15 PM in Oncology ([facility])     Dx: Polycythemia vera   0 Result Notes          Component Ref Range & Units 8 d ago (07/08/22) 3 mo ago (04/02/22) 5 mo ago (01/29/22) 7 mo ago (12/17/21) 8 mo ago (10/22/21) 9 mo ago (10/08/21) 9 mo ago (09/25/21)  Sodium 135 - 145 mmol/L 136 139 135 138 135 132 Low  141 CM  Potassium 3.5 - 5.1 mmol/L 2.7 Low Panic  3.6 4.6 3.8 4.2 4.0 4.4  Comment: CRITICAL RESULT CALLED TO, READ BACK BY AND VERIFIED WITH SANTOS,E AT 1345 ON 4.8.24 BY ISLEY,B REPEATED TO VERIFY  Chloride 98 - 111 mmol/L 103 101 101 104 106 99 115 High   CO2 22 - 32 mmol/L 24 24 26 27 25 24 24   Glucose, Bld 70 - 99 mg/dL 130 High  865 High  CM 176 High  CM 126 High  CM 190 High  CM 138 High  CM 95 CM  Comment: Glucose reference range applies only to samples taken after fasting for at least 8 hours.  BUN 8 - 23 mg/dL 37 High  25 High  36 High  30 High  31 High  34 High  23  Creatinine, Ser 0.44 - 1.00 mg/dL 7.84 High  6.96 High  2.95 High  1.36 High  1.43 High  1.44 High  0.84  Calcium 8.9 - 10.3 mg/dL 8.8 Low  9.2 9.1 8.9 8.5 Low  8.4 Low  7.9 Low   Total Protein 6.5 - 8.1 g/dL  6.2 Low  7.0 7.1 7.1 7.0 6.6 4.9 Low   Albumin 3.5 - 5.0 g/dL 3.4 Low  3.8 4.2 4.1 3.6 3.6 2.9 Low   AST 15 - 41 U/L 22 25 21 17 18 18 14  Low   ALT 0 - 44 U/L 16 16 15 12 11 13 10   Alkaline Phosphatase 38 - 126 U/L 93 102 79 95 98 83 73  Total Bilirubin 0.3 - 1.2 mg/dL 0.4 0.4 0.4 0.3 0.4 0.5 0.3  GFR, Estimated >60 mL/min 34 Low  32 Low  CM 33 Low  CM 36 Low  CM 34 Low  CM 34 Low  CM >60 CM  Comment: (NOTE) Calculated using the CKD-EPI Creatinine Equation (2021)  Anion gap 5 - 15 9 14  CM 8 CM 7 CM 4 Low  CM 9 CM 2 Low  CM  Comment: Performed at [facility], [address] [place], [place] [address]  Resulting Agency [facility]         Specimen Collected: 07/08/22 13:18 Last Resulted: 07/08/22 13:46      Basic Metabolic Panel - Cancer Center Only Order: [id number] Status: Final result     Visible to patient: Yes (seen)     Next appt: 08/07/2022 at 01:15 PM in Oncology ([facility])     Dx: Hypokalemia   0 Result Notes          Component Ref Range & Units 6 d ago 8 d ago 3  mo ago 5 mo ago 7 mo ago 8 mo ago 9 mo ago  Sodium 135 - 145 mmol/L 135 136 139 135 138 135 132 Low   Potassium 3.5 - 5.1 mmol/L 3.4 Low  2.7 Low Panic  CM 3.6 4.6 3.8 4.2 4.0  Chloride 98 - 111 mmol/L 104 103 101 101 104 106 99  CO2 22 - 32 mmol/L Glucose, Bld 70 - 99 mg/dL 409 High  811 High  CM 124 High  CM 176 High  CM 126 High  CM 190 High  CM 138 High  CM  Comment: Glucose reference range applies only to samples taken after fasting for at least 8 hours.  BUN 8 - 23 mg/dL 31 High  37 High  25 High  36 High  30 High  31 High  34 High   Creatinine 0.44 - 1.00 mg/dL 9.14 High  7.82 High  9.56 High  1.46 High  1.36 High  1.43 High  1.44 High   Calcium 8.9 - 10.3 mg/dL 8.4 Low  8.8 Low  9.2 9.1 8.9 8.5 Low  8.4 Low   GFR, Estimated >60 mL/min 34 Low         Comment: (NOTE) Calculated using  the CKD-EPI Creatinine Equation (2021)  Anion gap 5 - CM 14 CM 8 CM 7 CM 4 Low  CM 9 CM  Comment: Performed at [facility], [address] [place], [place] [address]  Resulting Agency [facility]         Specimen Collected: 07/10/22 13:08 Last Resulted: 07/10/22 13:29

## 2022-07-17 ENCOUNTER — Other Ambulatory Visit (HOSPITAL_COMMUNITY): Payer: Self-pay

## 2022-07-17 ENCOUNTER — Other Ambulatory Visit: Payer: Self-pay

## 2022-07-17 DIAGNOSIS — R269 Unspecified abnormalities of gait and mobility: Secondary | ICD-10-CM | POA: Diagnosis not present

## 2022-07-17 DIAGNOSIS — G301 Alzheimer's disease with late onset: Secondary | ICD-10-CM | POA: Diagnosis not present

## 2022-07-17 DIAGNOSIS — I739 Peripheral vascular disease, unspecified: Secondary | ICD-10-CM | POA: Diagnosis not present

## 2022-07-17 DIAGNOSIS — S80921D Unspecified superficial injury of right lower leg, subsequent encounter: Secondary | ICD-10-CM | POA: Diagnosis not present

## 2022-07-17 DIAGNOSIS — N1832 Chronic kidney disease, stage 3b: Secondary | ICD-10-CM | POA: Diagnosis not present

## 2022-07-17 DIAGNOSIS — S90521D Blister (nonthermal), right ankle, subsequent encounter: Secondary | ICD-10-CM | POA: Diagnosis not present

## 2022-07-17 DIAGNOSIS — F330 Major depressive disorder, recurrent, mild: Secondary | ICD-10-CM | POA: Diagnosis not present

## 2022-07-17 DIAGNOSIS — F02B4 Dementia in other diseases classified elsewhere, moderate, with anxiety: Secondary | ICD-10-CM | POA: Diagnosis not present

## 2022-07-17 DIAGNOSIS — L03115 Cellulitis of right lower limb: Secondary | ICD-10-CM | POA: Diagnosis not present

## 2022-07-17 DIAGNOSIS — D45 Polycythemia vera: Secondary | ICD-10-CM | POA: Diagnosis not present

## 2022-07-18 DIAGNOSIS — S81801A Unspecified open wound, right lower leg, initial encounter: Secondary | ICD-10-CM | POA: Diagnosis not present

## 2022-07-18 DIAGNOSIS — N1832 Chronic kidney disease, stage 3b: Secondary | ICD-10-CM | POA: Diagnosis not present

## 2022-07-18 DIAGNOSIS — M722 Plantar fascial fibromatosis: Secondary | ICD-10-CM | POA: Diagnosis not present

## 2022-07-18 DIAGNOSIS — B351 Tinea unguium: Secondary | ICD-10-CM | POA: Diagnosis not present

## 2022-07-18 DIAGNOSIS — L600 Ingrowing nail: Secondary | ICD-10-CM | POA: Diagnosis not present

## 2022-07-18 DIAGNOSIS — R2689 Other abnormalities of gait and mobility: Secondary | ICD-10-CM | POA: Diagnosis not present

## 2022-07-18 DIAGNOSIS — M6281 Muscle weakness (generalized): Secondary | ICD-10-CM | POA: Diagnosis not present

## 2022-07-18 DIAGNOSIS — R238 Other skin changes: Secondary | ICD-10-CM | POA: Diagnosis not present

## 2022-07-18 DIAGNOSIS — L989 Disorder of the skin and subcutaneous tissue, unspecified: Secondary | ICD-10-CM | POA: Diagnosis not present

## 2022-07-18 DIAGNOSIS — R262 Difficulty in walking, not elsewhere classified: Secondary | ICD-10-CM | POA: Diagnosis not present

## 2022-07-18 DIAGNOSIS — I872 Venous insufficiency (chronic) (peripheral): Secondary | ICD-10-CM | POA: Diagnosis not present

## 2022-07-18 DIAGNOSIS — F02C0 Dementia in other diseases classified elsewhere, severe, without behavioral disturbance, psychotic disturbance, mood disturbance, and anxiety: Secondary | ICD-10-CM | POA: Diagnosis not present

## 2022-07-22 ENCOUNTER — Other Ambulatory Visit: Payer: Self-pay

## 2022-07-22 DIAGNOSIS — F02B4 Dementia in other diseases classified elsewhere, moderate, with anxiety: Secondary | ICD-10-CM | POA: Diagnosis not present

## 2022-07-22 DIAGNOSIS — S80921D Unspecified superficial injury of right lower leg, subsequent encounter: Secondary | ICD-10-CM | POA: Diagnosis not present

## 2022-07-22 DIAGNOSIS — L03115 Cellulitis of right lower limb: Secondary | ICD-10-CM | POA: Diagnosis not present

## 2022-07-22 DIAGNOSIS — I739 Peripheral vascular disease, unspecified: Secondary | ICD-10-CM | POA: Diagnosis not present

## 2022-07-22 DIAGNOSIS — G301 Alzheimer's disease with late onset: Secondary | ICD-10-CM | POA: Diagnosis not present

## 2022-07-22 DIAGNOSIS — S90521D Blister (nonthermal), right ankle, subsequent encounter: Secondary | ICD-10-CM | POA: Diagnosis not present

## 2022-07-23 DIAGNOSIS — F02B4 Dementia in other diseases classified elsewhere, moderate, with anxiety: Secondary | ICD-10-CM | POA: Diagnosis not present

## 2022-07-23 DIAGNOSIS — L03115 Cellulitis of right lower limb: Secondary | ICD-10-CM | POA: Diagnosis not present

## 2022-07-23 DIAGNOSIS — I739 Peripheral vascular disease, unspecified: Secondary | ICD-10-CM | POA: Diagnosis not present

## 2022-07-23 DIAGNOSIS — S90521D Blister (nonthermal), right ankle, subsequent encounter: Secondary | ICD-10-CM | POA: Diagnosis not present

## 2022-07-23 DIAGNOSIS — S80921D Unspecified superficial injury of right lower leg, subsequent encounter: Secondary | ICD-10-CM | POA: Diagnosis not present

## 2022-07-23 DIAGNOSIS — G301 Alzheimer's disease with late onset: Secondary | ICD-10-CM | POA: Diagnosis not present

## 2022-07-29 DIAGNOSIS — S90521D Blister (nonthermal), right ankle, subsequent encounter: Secondary | ICD-10-CM | POA: Diagnosis not present

## 2022-07-29 DIAGNOSIS — L03115 Cellulitis of right lower limb: Secondary | ICD-10-CM | POA: Diagnosis not present

## 2022-07-29 DIAGNOSIS — G301 Alzheimer's disease with late onset: Secondary | ICD-10-CM | POA: Diagnosis not present

## 2022-07-29 DIAGNOSIS — I739 Peripheral vascular disease, unspecified: Secondary | ICD-10-CM | POA: Diagnosis not present

## 2022-07-29 DIAGNOSIS — S80921D Unspecified superficial injury of right lower leg, subsequent encounter: Secondary | ICD-10-CM | POA: Diagnosis not present

## 2022-07-29 DIAGNOSIS — F02B4 Dementia in other diseases classified elsewhere, moderate, with anxiety: Secondary | ICD-10-CM | POA: Diagnosis not present

## 2022-07-30 ENCOUNTER — Other Ambulatory Visit: Payer: Self-pay | Admitting: Oncology

## 2022-07-30 DIAGNOSIS — E785 Hyperlipidemia, unspecified: Secondary | ICD-10-CM | POA: Diagnosis not present

## 2022-07-30 DIAGNOSIS — I739 Peripheral vascular disease, unspecified: Secondary | ICD-10-CM | POA: Diagnosis not present

## 2022-07-30 DIAGNOSIS — E039 Hypothyroidism, unspecified: Secondary | ICD-10-CM | POA: Diagnosis not present

## 2022-07-30 DIAGNOSIS — S80921D Unspecified superficial injury of right lower leg, subsequent encounter: Secondary | ICD-10-CM | POA: Diagnosis not present

## 2022-07-30 DIAGNOSIS — F02B4 Dementia in other diseases classified elsewhere, moderate, with anxiety: Secondary | ICD-10-CM | POA: Diagnosis not present

## 2022-07-30 DIAGNOSIS — S90521D Blister (nonthermal), right ankle, subsequent encounter: Secondary | ICD-10-CM | POA: Diagnosis not present

## 2022-07-30 DIAGNOSIS — G301 Alzheimer's disease with late onset: Secondary | ICD-10-CM | POA: Diagnosis not present

## 2022-07-30 DIAGNOSIS — L03115 Cellulitis of right lower limb: Secondary | ICD-10-CM | POA: Diagnosis not present

## 2022-07-31 DIAGNOSIS — R809 Proteinuria, unspecified: Secondary | ICD-10-CM | POA: Diagnosis not present

## 2022-07-31 DIAGNOSIS — N2581 Secondary hyperparathyroidism of renal origin: Secondary | ICD-10-CM | POA: Diagnosis not present

## 2022-07-31 DIAGNOSIS — N1832 Chronic kidney disease, stage 3b: Secondary | ICD-10-CM | POA: Diagnosis not present

## 2022-07-31 DIAGNOSIS — D631 Anemia in chronic kidney disease: Secondary | ICD-10-CM | POA: Diagnosis not present

## 2022-07-31 DIAGNOSIS — R300 Dysuria: Secondary | ICD-10-CM | POA: Diagnosis not present

## 2022-07-31 DIAGNOSIS — E875 Hyperkalemia: Secondary | ICD-10-CM | POA: Diagnosis not present

## 2022-08-05 DIAGNOSIS — I739 Peripheral vascular disease, unspecified: Secondary | ICD-10-CM | POA: Diagnosis not present

## 2022-08-05 DIAGNOSIS — S80921D Unspecified superficial injury of right lower leg, subsequent encounter: Secondary | ICD-10-CM | POA: Diagnosis not present

## 2022-08-05 DIAGNOSIS — Z9181 History of falling: Secondary | ICD-10-CM | POA: Diagnosis not present

## 2022-08-05 DIAGNOSIS — F02B4 Dementia in other diseases classified elsewhere, moderate, with anxiety: Secondary | ICD-10-CM | POA: Diagnosis not present

## 2022-08-05 DIAGNOSIS — S90521D Blister (nonthermal), right ankle, subsequent encounter: Secondary | ICD-10-CM | POA: Diagnosis not present

## 2022-08-05 DIAGNOSIS — Z7982 Long term (current) use of aspirin: Secondary | ICD-10-CM | POA: Diagnosis not present

## 2022-08-05 DIAGNOSIS — L03115 Cellulitis of right lower limb: Secondary | ICD-10-CM | POA: Diagnosis not present

## 2022-08-05 DIAGNOSIS — G301 Alzheimer's disease with late onset: Secondary | ICD-10-CM | POA: Diagnosis not present

## 2022-08-06 DIAGNOSIS — I739 Peripheral vascular disease, unspecified: Secondary | ICD-10-CM | POA: Diagnosis not present

## 2022-08-06 DIAGNOSIS — S90521D Blister (nonthermal), right ankle, subsequent encounter: Secondary | ICD-10-CM | POA: Diagnosis not present

## 2022-08-06 DIAGNOSIS — F02B4 Dementia in other diseases classified elsewhere, moderate, with anxiety: Secondary | ICD-10-CM | POA: Diagnosis not present

## 2022-08-06 DIAGNOSIS — G301 Alzheimer's disease with late onset: Secondary | ICD-10-CM | POA: Diagnosis not present

## 2022-08-06 DIAGNOSIS — S80921D Unspecified superficial injury of right lower leg, subsequent encounter: Secondary | ICD-10-CM | POA: Diagnosis not present

## 2022-08-06 DIAGNOSIS — L03115 Cellulitis of right lower limb: Secondary | ICD-10-CM | POA: Diagnosis not present

## 2022-08-07 ENCOUNTER — Inpatient Hospital Stay: Payer: Medicare Other

## 2022-08-12 ENCOUNTER — Inpatient Hospital Stay: Payer: Medicare Other | Attending: Oncology

## 2022-08-12 ENCOUNTER — Inpatient Hospital Stay: Payer: Medicare Other

## 2022-08-12 VITALS — BP 139/71 | HR 82 | Resp 18

## 2022-08-12 DIAGNOSIS — Z79899 Other long term (current) drug therapy: Secondary | ICD-10-CM | POA: Insufficient documentation

## 2022-08-12 DIAGNOSIS — D631 Anemia in chronic kidney disease: Secondary | ICD-10-CM

## 2022-08-12 DIAGNOSIS — D45 Polycythemia vera: Secondary | ICD-10-CM

## 2022-08-12 DIAGNOSIS — N1832 Chronic kidney disease, stage 3b: Secondary | ICD-10-CM | POA: Diagnosis not present

## 2022-08-12 LAB — CBC WITH DIFFERENTIAL (CANCER CENTER ONLY)
Abs Immature Granulocytes: 0.72 10*3/uL — ABNORMAL HIGH (ref 0.00–0.07)
Basophils Absolute: 0.2 10*3/uL — ABNORMAL HIGH (ref 0.0–0.1)
Basophils Relative: 2 %
Eosinophils Absolute: 0.5 10*3/uL (ref 0.0–0.5)
Eosinophils Relative: 5 %
HCT: 29 % — ABNORMAL LOW (ref 36.0–46.0)
Hemoglobin: 9 g/dL — ABNORMAL LOW (ref 12.0–15.0)
Immature Granulocytes: 7 %
Lymphocytes Relative: 13 %
Lymphs Abs: 1.4 10*3/uL (ref 0.7–4.0)
MCH: 30.1 pg (ref 26.0–34.0)
MCHC: 31 g/dL (ref 30.0–36.0)
MCV: 97 fL (ref 80.0–100.0)
Monocytes Absolute: 1 10*3/uL (ref 0.1–1.0)
Monocytes Relative: 9 %
Neutro Abs: 7.1 10*3/uL (ref 1.7–7.7)
Neutrophils Relative %: 64 %
Platelet Count: 775 10*3/uL — ABNORMAL HIGH (ref 150–400)
RBC: 2.99 MIL/uL — ABNORMAL LOW (ref 3.87–5.11)
RDW: 23.9 % — ABNORMAL HIGH (ref 11.5–15.5)
Smear Review: INCREASED
WBC Count: 10.9 10*3/uL — ABNORMAL HIGH (ref 4.0–10.5)
nRBC: 0.4 % — ABNORMAL HIGH (ref 0.0–0.2)

## 2022-08-12 MED ORDER — EPOETIN ALFA-EPBX 20000 UNIT/ML IJ SOLN
20000.0000 [IU] | Freq: Once | INTRAMUSCULAR | Status: AC
  Administered 2022-08-12: 20000 [IU] via SUBCUTANEOUS
  Filled 2022-08-12: qty 1

## 2022-08-13 ENCOUNTER — Other Ambulatory Visit (HOSPITAL_COMMUNITY): Payer: Self-pay

## 2022-08-14 ENCOUNTER — Encounter: Payer: Self-pay | Admitting: Nurse Practitioner

## 2022-08-14 ENCOUNTER — Non-Acute Institutional Stay: Payer: Medicare Other | Admitting: Nurse Practitioner

## 2022-08-14 DIAGNOSIS — G301 Alzheimer's disease with late onset: Secondary | ICD-10-CM | POA: Diagnosis not present

## 2022-08-14 DIAGNOSIS — N390 Urinary tract infection, site not specified: Secondary | ICD-10-CM | POA: Diagnosis not present

## 2022-08-14 DIAGNOSIS — D7581 Myelofibrosis: Secondary | ICD-10-CM | POA: Diagnosis not present

## 2022-08-14 DIAGNOSIS — F0393 Unspecified dementia, unspecified severity, with mood disturbance: Secondary | ICD-10-CM

## 2022-08-14 DIAGNOSIS — R269 Unspecified abnormalities of gait and mobility: Secondary | ICD-10-CM | POA: Diagnosis not present

## 2022-08-14 DIAGNOSIS — R634 Abnormal weight loss: Secondary | ICD-10-CM | POA: Diagnosis not present

## 2022-08-14 DIAGNOSIS — N1832 Chronic kidney disease, stage 3b: Secondary | ICD-10-CM | POA: Diagnosis not present

## 2022-08-14 DIAGNOSIS — Z515 Encounter for palliative care: Secondary | ICD-10-CM

## 2022-08-14 DIAGNOSIS — E440 Moderate protein-calorie malnutrition: Secondary | ICD-10-CM | POA: Diagnosis not present

## 2022-08-14 DIAGNOSIS — D45 Polycythemia vera: Secondary | ICD-10-CM | POA: Diagnosis not present

## 2022-08-14 DIAGNOSIS — E876 Hypokalemia: Secondary | ICD-10-CM | POA: Diagnosis not present

## 2022-08-14 DIAGNOSIS — F02B4 Dementia in other diseases classified elsewhere, moderate, with anxiety: Secondary | ICD-10-CM | POA: Diagnosis not present

## 2022-08-14 NOTE — Progress Notes (Addendum)
Therapist, nutritional Palliative Care Consult Note Telephone: [phone]  Fax: [phone]    Date of encounter: 08/14/22 7:59 PM PATIENT NAME: Laura Wilcox [address] [place] [address]   [phone] (home)  DOB: 04/04/1926 MRN: [id number] PRIMARY CARE PROVIDER:    [facility]  RESPONSIBLE PARTY:    Contact Information     Name Relation Home Work Mobile   [place] Daughter   [phone]   Laura Wilcox   [phone]   Laura Wilcox Relative   [phone]      I met face to face with patient in facility. Palliative Care was asked to follow this patient by consultation request of  [facility] calls to address advance care planning and complex medical decision making. This is the initial visit.      ASSESSMENT AND PLAN / RECOMMENDATIONS:  Symptom Management/Plan: 1. Advance Care Planning;  ongoing discussions;   2. Palliative care encounter; Palliative care encounter; Palliative medicine team will continue to support patient, patient's family, and medical team. Visit consisted of counseling and education dealing with the complex and emotionally intense issues of symptom management and palliative care in the setting of serious and potentially life-threatening illness   3. Dementia progressive, ongoing decline, cognitively, functionally. Mood swings contininues; continue to be followed by Psychiatry   4. Anorexia, protein calorie malnutrition. Loss; Reviewed weights, continue to monitor weights, continue to encourage to eat, may benefit from appetite stimulant though also possible pill burden. Encourage supplements.   04/02/2022 weight 122.9 lbs 05/17/2022 weight 126 lbs 06/15/2022 weight 116 lbs 07/01/2022 weight 119.4 lbs 07/31/2022 weight 115 lbs 4 lbs loss; 4 weeks; 6.43% 7.9 lbs loss/4 months; 3.69% Follow up Palliative Care Visit: PC f/u visit further discussion monitor trends of  appetite, weights, monitor for functional, cognitive decline with chronic disease progression, assess any active symptoms, supportive role.Palliative care will continue to follow for complex medical decision making, advance care planning, and clarification of goals. Return 2 to 8 weeks or prn.   I spent 43 minutes providing this consultation. More than 50% of the time in this consultation was spent in counseling and care coordination. PPS: 50% Chief Complaint: Initial palliative consult for complex medical decision making, address goals, manage ongoing symptoms   HISTORY OF PRESENT ILLNESS:  Laura Wilcox is a 89 y.o. year old female  with multiple medical problems including Dementia, polycythemia vera and secondary myelofibrosis taking jakafi, anemia of chronic disease, CKD, h/o UTI's, depression, protein calorie malnutrition. Purpose of today PC f/u visit further discussion monitor trends of appetite, weights, monitor for functional, cognitive decline with chronic disease progression, assess any active symptoms, supportive role. Laura Wilcox resides at ALF [facility]. Laura Wilcox is ambulatory per staff, no recent falls. Laura Wilcox requires assistance with adl's, able to toilet herself with some incontience episodes. Laura Wilcox feeds herself with some prompting, deceased appetite.  Purpose of today PC f/u visit further discussion monitor trends of appetite, weights, monitor for functional, cognitive decline with chronic disease progression, assess any active symptoms, supportive role. At present Laura Wilcox is sitting in the dining area, feeding herself lunch. Appears comfortable. Laura Wilcox does make eye contact, attempts to engage, worsening confusion. We talked about ros, no meaningful discussion due to cognitive impairment. She was able to answer intermit questions, clear speech. Currently on cipro for UTI. Noted weight loss. Support provided. Medications, goc, poc reviewed. Attempted to contact dtg.  Reviewed medications, goc, poc, discussion with staff. Continue  current poc. PC f/u visit further discussion monitor trends of appetite, weights, monitor for functional, cognitive decline with chronic disease progression, assess any active symptoms, supportive role.  08/19/2022 I contacted Laura [place], Laura Haut daughter, clinical update given, talked about UTI, confusion, chronic disease progression, expectations, goc. Support provided. Will continue to monitor.    Support provided.  History obtained from review of EMR, discussion with primary team, and interview with family, facility staff/caregiver and/or Laura Wilcox.  I reviewed available labs, medications, imaging, studies and related documents from the EMR.  Records reviewed and summarized above.    Physical Exam: General: frail appearing, thin, confused female ENMT: oral mucous membranes moist CV: S1S2, RRR Pulmonary: breath sounds clear MSK: ambulatory Neuro:  + generalized weakness,  + cognitive impairment Psych: non-anxious affect, Alert, confused Thank you for the opportunity to participate in the care of Laura Wilcox. Please call our office at [phone] if we can be of additional assistance.   Laura Kenagy Prince [place], NP

## 2022-08-15 DIAGNOSIS — F330 Major depressive disorder, recurrent, mild: Secondary | ICD-10-CM | POA: Diagnosis not present

## 2022-08-15 DIAGNOSIS — G301 Alzheimer's disease with late onset: Secondary | ICD-10-CM | POA: Diagnosis not present

## 2022-08-21 ENCOUNTER — Other Ambulatory Visit (HOSPITAL_COMMUNITY): Payer: Self-pay

## 2022-08-29 DIAGNOSIS — S81801A Unspecified open wound, right lower leg, initial encounter: Secondary | ICD-10-CM | POA: Diagnosis not present

## 2022-08-29 DIAGNOSIS — F339 Major depressive disorder, recurrent, unspecified: Secondary | ICD-10-CM | POA: Diagnosis not present

## 2022-08-29 DIAGNOSIS — E039 Hypothyroidism, unspecified: Secondary | ICD-10-CM | POA: Diagnosis not present

## 2022-09-01 DIAGNOSIS — F0393 Unspecified dementia, unspecified severity, with mood disturbance: Secondary | ICD-10-CM | POA: Diagnosis not present

## 2022-09-01 DIAGNOSIS — S81801D Unspecified open wound, right lower leg, subsequent encounter: Secondary | ICD-10-CM | POA: Diagnosis not present

## 2022-09-01 DIAGNOSIS — G8929 Other chronic pain: Secondary | ICD-10-CM | POA: Diagnosis not present

## 2022-09-01 DIAGNOSIS — N189 Chronic kidney disease, unspecified: Secondary | ICD-10-CM | POA: Diagnosis not present

## 2022-09-01 DIAGNOSIS — F39 Unspecified mood [affective] disorder: Secondary | ICD-10-CM | POA: Diagnosis not present

## 2022-09-01 DIAGNOSIS — E43 Unspecified severe protein-calorie malnutrition: Secondary | ICD-10-CM | POA: Diagnosis not present

## 2022-09-01 DIAGNOSIS — F32A Depression, unspecified: Secondary | ICD-10-CM | POA: Diagnosis not present

## 2022-09-01 DIAGNOSIS — Z7982 Long term (current) use of aspirin: Secondary | ICD-10-CM | POA: Diagnosis not present

## 2022-09-03 ENCOUNTER — Ambulatory Visit (INDEPENDENT_AMBULATORY_CARE_PROVIDER_SITE_OTHER): Payer: Medicare Other | Admitting: Dermatology

## 2022-09-03 VITALS — BP 112/65

## 2022-09-03 DIAGNOSIS — W908XXA Exposure to other nonionizing radiation, initial encounter: Secondary | ICD-10-CM

## 2022-09-03 DIAGNOSIS — L578 Other skin changes due to chronic exposure to nonionizing radiation: Secondary | ICD-10-CM | POA: Diagnosis not present

## 2022-09-03 DIAGNOSIS — D492 Neoplasm of unspecified behavior of bone, soft tissue, and skin: Secondary | ICD-10-CM

## 2022-09-03 DIAGNOSIS — X32XXXA Exposure to sunlight, initial encounter: Secondary | ICD-10-CM

## 2022-09-03 DIAGNOSIS — C44722 Squamous cell carcinoma of skin of right lower limb, including hip: Secondary | ICD-10-CM

## 2022-09-03 DIAGNOSIS — Z85828 Personal history of other malignant neoplasm of skin: Secondary | ICD-10-CM

## 2022-09-03 MED ORDER — DOXYCYCLINE MONOHYDRATE 100 MG PO CAPS: 100.0000 mg | ORAL_CAPSULE | Freq: Two times a day (BID) | ORAL | 0 refills | Status: AC

## 2022-09-03 MED ORDER — MUPIROCIN 2 % EX OINT: 1.0000 | TOPICAL_OINTMENT | Freq: Every day | CUTANEOUS | 1 refills | Status: DC

## 2022-09-03 NOTE — Patient Instructions (Signed)
Due to recent changes in healthcare laws, you may see results of your pathology and/or laboratory studies on MyChart before the doctors have had a chance to review them. We understand that in some cases there may be results that are confusing or concerning to you. Please understand that not all results are received at the same time and often the doctors may need to interpret multiple results in order to provide you with the best plan of care or course of treatment. Therefore, we ask that you please give us 2 business days to thoroughly review all your results before contacting the office for clarification. Should we see a critical lab result, you will be contacted sooner.   If You Need Anything After Your Visit  If you have any questions or concerns for your doctor, please call our main line at [phone] and press option 4 to reach your doctor's medical assistant. If no one answers, please leave a voicemail as directed and we will return your call as soon as possible. Messages left after 4 pm will be answered the following business day.   You may also send us a message via MyChart. We typically respond to MyChart messages within 1-2 business days.  For prescription refills, please ask your pharmacy to contact our office. Our fax number is [phone].  If you have an urgent issue when the clinic is closed that cannot wait until the next business day, you can page your doctor at the number below.    Please note that while we do our best to be available for urgent issues outside of office hours, we are not available 24/7.   If you have an urgent issue and are unable to reach us, you may choose to seek medical care at your doctor's office, retail clinic, urgent care center, or emergency room.  If you have a medical emergency, please immediately call 911 or go to the emergency department.  Pager Numbers  - Dr. Kowalski: [phone]  - Dr. Moye: [phone]  - Dr. Stewart:  [phone]  In the event of inclement weather, please call our main line at [phone] for an update on the status of any delays or closures.  Dermatology Medication Tips: Please keep the boxes that topical medications come in in order to help keep track of the instructions about where and how to use these. Pharmacies typically print the medication instructions only on the boxes and not directly on the medication tubes.   If your medication is too expensive, please contact our office at [phone] option 4 or send us a message through MyChart.   We are unable to tell what your co-pay for medications will be in advance as this is different depending on your insurance coverage. However, we may be able to find a substitute medication at lower cost or fill out paperwork to get insurance to cover a needed medication.   If a prior authorization is required to get your medication covered by your insurance company, please allow us 1-2 business days to complete this process.  Drug prices often vary depending on where the prescription is filled and some pharmacies may offer cheaper prices.  The website www.goodrx.com contains coupons for medications through different pharmacies. The prices here do not account for what the cost may be with help from insurance (it may be cheaper with your insurance), but the website can give you the price if you did not use any insurance.  - You can print the associated coupon and take it with   your prescription to the pharmacy.  - You may also stop by our office during regular business hours and pick up a GoodRx coupon card.  - If you need your prescription sent electronically to a different pharmacy, notify our office through [place] MyChart or by phone at [phone] option 4.     Si Usted Necesita Algo Despus de Su Visita  Tambin puede enviarnos un mensaje a travs de MyChart. Por lo general respondemos a los mensajes de MyChart en el transcurso de 1 a 2  das hbiles.  Para renovar recetas, por favor pida a su farmacia que se ponga en contacto con nuestra oficina. Nuestro nmero de fax es el [phone].  Si tiene un asunto urgente cuando la clnica est cerrada y que no puede esperar hasta el siguiente da hbil, puede llamar/localizar a su doctor(a) al nmero que aparece a continuacin.   Por favor, tenga en cuenta que aunque hacemos todo lo posible para estar disponibles para asuntos urgentes fuera del horario de oficina, no estamos disponibles las 24 horas del da, los 7 das de la semana.   Si tiene un problema urgente y no puede comunicarse con nosotros, puede optar por buscar atencin mdica  en el consultorio de su doctor(a), en una clnica privada, en un centro de atencin urgente o en una sala de emergencias.  Si tiene una emergencia mdica, por favor llame inmediatamente al 911 o vaya a la sala de emergencias.  Nmeros de bper  - Dr. Kowalski: [phone]  - Dra. Moye: [phone]  - Dra. Stewart: [phone]  En caso de inclemencias del tiempo, por favor llame a nuestra lnea principal al [phone] para una actualizacin sobre el estado de cualquier retraso o cierre.  Consejos para la medicacin en dermatologa: Por favor, guarde las cajas en las que vienen los medicamentos de uso tpico para ayudarle a seguir las instrucciones sobre dnde y cmo usarlos. Las farmacias generalmente imprimen las instrucciones del medicamento slo en las cajas y no directamente en los tubos del medicamento.   Si su medicamento es muy caro, por favor, pngase en contacto con nuestra oficina llamando al [phone] y presione la opcin 4 o envenos un mensaje a travs de MyChart.   No podemos decirle cul ser su copago por los medicamentos por adelantado ya que esto es diferente dependiendo de la cobertura de su seguro. Sin embargo, es posible que podamos encontrar un medicamento sustituto a menor costo o llenar un formulario para que el  seguro cubra el medicamento que se considera necesario.   Si se requiere una autorizacin previa para que su compaa de seguros cubra su medicamento, por favor permtanos de 1 a 2 das hbiles para completar este proceso.  Los precios de los medicamentos varan con frecuencia dependiendo del lugar de dnde se surte la receta y alguna farmacias pueden ofrecer precios ms baratos.  El sitio web www.goodrx.com tiene cupones para medicamentos de diferentes farmacias. Los precios aqu no tienen en cuenta lo que podra costar con la ayuda del seguro (puede ser ms barato con su seguro), pero el sitio web puede darle el precio si no utiliz ningn seguro.  - Puede imprimir el cupn correspondiente y llevarlo con su receta a la farmacia.  - Tambin puede pasar por nuestra oficina durante el horario de atencin regular y recoger una tarjeta de cupones de GoodRx.  - Si necesita que su receta se enve electrnicamente a una farmacia diferente, informe a nuestra oficina a travs de MyChart de [place]   o por telfono llamando al [phone] y presione la opcin 4.  

## 2022-09-03 NOTE — Progress Notes (Signed)
Follow-Up Visit   Subjective  Laura Wilcox is a 89 y.o. female who presents for the following: check spot R medial ankle,~98m, growing, pt had recently been on Cipro for area on R lat ankle daughter said has odor, no symptoms  Patient accompanied by daughter. Pt has dementia.  The following portions of the chart were reviewed this encounter and updated as appropriate: medications, allergies, medical history  Review of Systems:  No other skin or systemic complaints except as noted in HPI or Assessment and Plan.  Objective  Well appearing patient in no apparent distress; mood and affect are within normal limits.   A focused examination was performed of the following areas: R lower leg  Relevant exam findings are noted in the Assessment and Plan.  R ankle Soft nodule with oozing and crusting 2.5cm       Assessment & Plan     Neoplasm of skin R ankle  Epidermal / dermal shaving  Lesion diameter (cm):  2.5 Informed consent: discussed and consent obtained   Patient was prepped and draped in usual sterile fashion: area prepped with alcohol. Anesthesia: the lesion was anesthetized in a standard fashion   Local anesthetic: bupivicaine. Instrument used: flexible razor blade   Hemostasis achieved with: pressure, aluminum chloride and electrodesiccation   Outcome: patient tolerated procedure well    Destruction of lesion Complexity: extensive   Destruction method: electrodesiccation and curettage   Informed consent: discussed and consent obtained   Curettage performed in three different directions: Yes   Electrodesiccation performed over the curetted area: Yes   Lesion length (cm):  2.5 Lesion width (cm):  2.5 Margin per side (cm):  0.2 Final wound size (cm):  2.9 Hemostasis achieved with:  suture, pressure, aluminum chloride and electrodesiccation Hemostasis achieved with comment:  Bleed stop Outcome: patient tolerated procedure well with no complications    Post-procedure details comment:  Pressure dressing Additional details:  Pt had extensive bleeding requiring multiple modalities to stop as above.  Aerobic culture  Specimen 1 - Surgical pathology Differential Diagnosis: R/O SCC  Check Margins: No Soft nodule with oozing and crusting 2.5cm EDC today  R/O SCC with secondary infection Bacterial culture today, shave removal and EDC today Discussed that it is possible that tumor will recur, and it that situation, recommend radiation.  Start Doxycycline 100mg  1 po bid with food and drink for 2 wks Start Mupirocin oint qd to wound  Doxycycline should be taken with food to prevent nausea. Do not lay down for 30 minutes after taking. Be cautious with sun exposure and use good sun protection while on this medication. Pregnant women should not take this medication.    Faxed medical care instructions to [facility], patient home health nurse [phone]), and to the [facility] [phone]   Patient returned to clinic for wound bleeding.  Dr. Roseanne Reno anesthetized pt with 3cc of bupivacaine, Aluminum chloride 35% applied to bx site, 2 3.0 vicryl sutures used for hemostasis.  Mupirocin applied to wound followed by non stick telfa, gauze and coban wrap.  Patient was instructed to elevate feet when she gets home.    ACTINIC DAMAGE - chronic, secondary to cumulative UV radiation exposure/sun exposure over time - diffuse scaly erythematous macules with underlying dyspigmentation - Recommend daily broad spectrum sunscreen SPF 30+ to sun-exposed areas, reapply every 2 hours as needed.  - Recommend staying in the shade or wearing long sleeves, sun glasses (UVA+UVB protection) and wide brim hats (4-inch brim around the entire  circumference of the hat). - Call for new or changing lesions.   HISTORY OF SQUAMOUS CELL CARCINOMA OF THE SKIN - No evidence of recurrence today L cheek - Recommend regular full body skin exams - Recommend daily broad  spectrum sunscreen SPF 30+ to sun-exposed areas, reapply every 2 hours as needed.  - Call if any new or changing lesions are noted between office visits    Return in about 1 month (around 10/03/2022) for f/u R ankle.  I, Ardis Rowan, RMA, am acting as scribe for Willeen Niece, MD .   Documentation: I have reviewed the above documentation for accuracy and completeness, and I agree with the above.  Willeen Niece, MD

## 2022-09-04 ENCOUNTER — Inpatient Hospital Stay: Payer: Medicare Other

## 2022-09-04 ENCOUNTER — Inpatient Hospital Stay: Payer: Medicare Other | Attending: Oncology

## 2022-09-04 VITALS — BP 136/75 | HR 75

## 2022-09-04 DIAGNOSIS — E876 Hypokalemia: Secondary | ICD-10-CM | POA: Insufficient documentation

## 2022-09-04 DIAGNOSIS — G301 Alzheimer's disease with late onset: Secondary | ICD-10-CM | POA: Diagnosis not present

## 2022-09-04 DIAGNOSIS — Z79899 Other long term (current) drug therapy: Secondary | ICD-10-CM | POA: Insufficient documentation

## 2022-09-04 DIAGNOSIS — N1832 Chronic kidney disease, stage 3b: Secondary | ICD-10-CM | POA: Insufficient documentation

## 2022-09-04 DIAGNOSIS — S81801D Unspecified open wound, right lower leg, subsequent encounter: Secondary | ICD-10-CM | POA: Diagnosis not present

## 2022-09-04 DIAGNOSIS — D631 Anemia in chronic kidney disease: Secondary | ICD-10-CM | POA: Insufficient documentation

## 2022-09-04 DIAGNOSIS — D45 Polycythemia vera: Secondary | ICD-10-CM | POA: Diagnosis not present

## 2022-09-04 DIAGNOSIS — F02B4 Dementia in other diseases classified elsewhere, moderate, with anxiety: Secondary | ICD-10-CM | POA: Diagnosis not present

## 2022-09-04 LAB — CBC WITH DIFFERENTIAL (CANCER CENTER ONLY)
Abs Immature Granulocytes: 0.69 10*3/uL — ABNORMAL HIGH (ref 0.00–0.07)
Basophils Absolute: 0.2 10*3/uL — ABNORMAL HIGH (ref 0.0–0.1)
Basophils Relative: 2 %
Eosinophils Absolute: 0.6 10*3/uL — ABNORMAL HIGH (ref 0.0–0.5)
Eosinophils Relative: 5 %
HCT: 31 % — ABNORMAL LOW (ref 36.0–46.0)
Hemoglobin: 9.6 g/dL — ABNORMAL LOW (ref 12.0–15.0)
Immature Granulocytes: 6 %
Lymphocytes Relative: 11 %
Lymphs Abs: 1.4 10*3/uL (ref 0.7–4.0)
MCH: 30.1 pg (ref 26.0–34.0)
MCHC: 31 g/dL (ref 30.0–36.0)
MCV: 97.2 fL (ref 80.0–100.0)
Monocytes Absolute: 1.2 10*3/uL — ABNORMAL HIGH (ref 0.1–1.0)
Monocytes Relative: 9 %
Neutro Abs: 8.2 10*3/uL — ABNORMAL HIGH (ref 1.7–7.7)
Neutrophils Relative %: 67 %
Platelet Count: 801 10*3/uL — ABNORMAL HIGH (ref 150–400)
RBC: 3.19 MIL/uL — ABNORMAL LOW (ref 3.87–5.11)
RDW: 22.8 % — ABNORMAL HIGH (ref 11.5–15.5)
Smear Review: INCREASED
WBC Count: 12.2 10*3/uL — ABNORMAL HIGH (ref 4.0–10.5)
nRBC: 0.2 % (ref 0.0–0.2)

## 2022-09-04 MED ORDER — EPOETIN ALFA-EPBX 20000 UNIT/ML IJ SOLN
20000.0000 [IU] | Freq: Once | INTRAMUSCULAR | Status: AC
  Administered 2022-09-04: 20000 [IU] via SUBCUTANEOUS
  Filled 2022-09-04: qty 1

## 2022-09-06 LAB — AEROBIC CULTURE

## 2022-09-09 ENCOUNTER — Telehealth: Payer: Self-pay

## 2022-09-09 NOTE — Telephone Encounter (Signed)
Advised patient's daughter [name] of bx results and culture results.  Advised to continue Doxycycline and Mupirocin as prescribed.  Advised to keep 1 month f/u./sh

## 2022-09-09 NOTE — Telephone Encounter (Signed)
-----   Message from Willeen Niece, MD sent at 09/09/2022 12:42 PM EDT ----- Positive for MRSA, pt is currently taking Doxycycline and applying mupirocin. Continue for 2 week course as prescribed.   - please call patient

## 2022-09-10 ENCOUNTER — Non-Acute Institutional Stay: Payer: Medicare Other | Admitting: Nurse Practitioner

## 2022-09-10 ENCOUNTER — Encounter: Payer: Self-pay | Admitting: Nurse Practitioner

## 2022-09-10 ENCOUNTER — Other Ambulatory Visit: Payer: Self-pay

## 2022-09-10 DIAGNOSIS — D45 Polycythemia vera: Secondary | ICD-10-CM

## 2022-09-10 DIAGNOSIS — E440 Moderate protein-calorie malnutrition: Secondary | ICD-10-CM | POA: Diagnosis not present

## 2022-09-10 DIAGNOSIS — Z515 Encounter for palliative care: Secondary | ICD-10-CM

## 2022-09-10 DIAGNOSIS — F0393 Unspecified dementia, unspecified severity, with mood disturbance: Secondary | ICD-10-CM | POA: Diagnosis not present

## 2022-09-10 NOTE — Progress Notes (Signed)
Therapist, nutritional Palliative Care Consult Note Telephone: [phone]  Fax: [phone]    Date of encounter: 09/10/22 4:57 PM PATIENT NAME: Laura Wilcox [address] [place] [address]   [phone] (home)  DOB: June 09, 1926 MRN: [id number] PRIMARY CARE PROVIDER:    [facility]  RESPONSIBLE PARTY:    Contact Information     Name Relation Home Work Mobile   [place] Daughter   [phone]   Laura Wilcox   [phone]   [place] Relative   [phone]      I met face to face with patient in facility. Palliative Care was asked to follow this patient by consultation request of  [facility] calls to address advance care planning and complex medical decision making. This is the initial visit.      ASSESSMENT AND PLAN / RECOMMENDATIONS:  Symptom Management/Plan: 1. Advance Care Planning;  ongoing discussions;   2. Palliative care encounter; Palliative care encounter; Palliative medicine team will continue to support patient, patient's family, and medical team. Visit consisted of counseling and education dealing with the complex and emotionally intense issues of symptom management and palliative care in the setting of serious and potentially life-threatening illness   3. Dementia progressive, with polycythemia rubra vera ongoing decline, cognitively, functionally. Mood swings contininues; continue to be followed by Psychiatry; Daughter declined hospice in the past as wishes are to continue infusions.    4. Anorexia, protein calorie malnutrition. Loss; Reviewed weights, continue to monitor weights, continue to encourage to eat, may benefit from appetite stimulant though also possible pill burden. Encourage supplements.    04/02/2022 weight 122.9 lbs 05/17/2022 weight 126 lbs 06/15/2022 weight 116 lbs 07/01/2022 weight 119.4 lbs 07/31/2022 weight 115 lbs 09/01/2022 weight 119.2 lbs Follow up  Palliative Care Visit: PC f/u visit further discussion monitor trends of appetite, weights, monitor for functional, cognitive decline with chronic disease progression, assess any active symptoms, supportive role.Palliative care will continue to follow for complex medical decision making, advance care planning, and clarification of goals. Return 2 to 8 weeks or prn.   I spent 44 minutes providing this consultation. More than 50% of the time in this consultation was spent in counseling and care coordination. PPS: 50% Chief Complaint: Initial palliative consult for complex medical decision making, address goals, manage ongoing symptoms   HISTORY OF PRESENT ILLNESS:  Laura Wilcox is a 89 y.o. year old female  with multiple medical problems including Dementia, polycythemia vera and secondary myelofibrosis taking jakafi, anemia of chronic disease, CKD, h/o UTI's, depression, protein calorie malnutrition. Purpose of today PC f/u visit further discussion monitor trends of appetite, weights, monitor for functional, cognitive decline with chronic disease progression, assess any active symptoms, supportive role. Laura Wilcox resides at [facility]. Laura Wilcox is ambulatory per staff, no recent falls. Laura Wilcox requires assistance with adl's, able to toilet herself with some incontience episodes. Laura Wilcox feeds herself with some prompting, deceased appetite.  Purpose of today PC f/u visit further discussion monitor trends of appetite, weights, monitor for functional, cognitive decline with chronic disease progression, assess any active symptoms, supportive role. At present Laura Wilcox is lying in bed, sleeping, awoke to verbal cues.  Attempted to contact dtg. Reviewed medications, goc, poc, discussion with staff. Continue current poc. PC f/u visit further discussion monitor trends of appetite, weights, monitor for functional, cognitive decline with chronic disease progression, assess any active symptoms,  supportive role.   Support provided.  History obtained from  review of EMR, discussion with primary team, and interview with family, facility staff/caregiver and/or Laura Wilcox.  I reviewed available labs, medications, imaging, studies and related documents from the EMR.  Records reviewed and summarized above.    Physical Exam: General: frail appearing, thin, confused female ENMT: oral mucous membranes moist CV: S1S2, RRR Pulmonary: breath sounds clear MSK: ambulatory Neuro:  + generalized weakness,  + cognitive impairment Psych: non-anxious affect, Alert, confused Thank you for the opportunity to participate in the care of Laura Wilcox. Please call our office at [phone] if we can be of additional assistance.   Shanta Dorvil Prince [place], NP

## 2022-09-11 DIAGNOSIS — E43 Unspecified severe protein-calorie malnutrition: Secondary | ICD-10-CM | POA: Diagnosis not present

## 2022-09-11 DIAGNOSIS — F02C0 Dementia in other diseases classified elsewhere, severe, without behavioral disturbance, psychotic disturbance, mood disturbance, and anxiety: Secondary | ICD-10-CM | POA: Diagnosis not present

## 2022-09-11 DIAGNOSIS — N1832 Chronic kidney disease, stage 3b: Secondary | ICD-10-CM | POA: Diagnosis not present

## 2022-09-11 DIAGNOSIS — F339 Major depressive disorder, recurrent, unspecified: Secondary | ICD-10-CM | POA: Diagnosis not present

## 2022-09-11 DIAGNOSIS — R269 Unspecified abnormalities of gait and mobility: Secondary | ICD-10-CM | POA: Diagnosis not present

## 2022-09-11 DIAGNOSIS — D45 Polycythemia vera: Secondary | ICD-10-CM | POA: Diagnosis not present

## 2022-09-11 DIAGNOSIS — N189 Chronic kidney disease, unspecified: Secondary | ICD-10-CM | POA: Diagnosis not present

## 2022-09-11 DIAGNOSIS — G301 Alzheimer's disease with late onset: Secondary | ICD-10-CM | POA: Diagnosis not present

## 2022-09-11 DIAGNOSIS — D7581 Myelofibrosis: Secondary | ICD-10-CM | POA: Diagnosis not present

## 2022-09-11 DIAGNOSIS — G8929 Other chronic pain: Secondary | ICD-10-CM | POA: Diagnosis not present

## 2022-09-11 DIAGNOSIS — E559 Vitamin D deficiency, unspecified: Secondary | ICD-10-CM | POA: Diagnosis not present

## 2022-09-11 DIAGNOSIS — S81801D Unspecified open wound, right lower leg, subsequent encounter: Secondary | ICD-10-CM | POA: Diagnosis not present

## 2022-09-11 DIAGNOSIS — F32A Depression, unspecified: Secondary | ICD-10-CM | POA: Diagnosis not present

## 2022-09-11 DIAGNOSIS — F0393 Unspecified dementia, unspecified severity, with mood disturbance: Secondary | ICD-10-CM | POA: Diagnosis not present

## 2022-09-12 ENCOUNTER — Other Ambulatory Visit: Payer: Self-pay

## 2022-09-12 DIAGNOSIS — S81801D Unspecified open wound, right lower leg, subsequent encounter: Secondary | ICD-10-CM | POA: Diagnosis not present

## 2022-09-12 DIAGNOSIS — G8929 Other chronic pain: Secondary | ICD-10-CM | POA: Diagnosis not present

## 2022-09-12 DIAGNOSIS — G301 Alzheimer's disease with late onset: Secondary | ICD-10-CM | POA: Diagnosis not present

## 2022-09-12 DIAGNOSIS — F339 Major depressive disorder, recurrent, unspecified: Secondary | ICD-10-CM | POA: Diagnosis not present

## 2022-09-12 DIAGNOSIS — E43 Unspecified severe protein-calorie malnutrition: Secondary | ICD-10-CM | POA: Diagnosis not present

## 2022-09-12 DIAGNOSIS — F32A Depression, unspecified: Secondary | ICD-10-CM | POA: Diagnosis not present

## 2022-09-12 DIAGNOSIS — N189 Chronic kidney disease, unspecified: Secondary | ICD-10-CM | POA: Diagnosis not present

## 2022-09-12 DIAGNOSIS — F0393 Unspecified dementia, unspecified severity, with mood disturbance: Secondary | ICD-10-CM | POA: Diagnosis not present

## 2022-09-17 DIAGNOSIS — M6281 Muscle weakness (generalized): Secondary | ICD-10-CM | POA: Diagnosis not present

## 2022-09-17 DIAGNOSIS — B351 Tinea unguium: Secondary | ICD-10-CM | POA: Diagnosis not present

## 2022-09-17 DIAGNOSIS — R2689 Other abnormalities of gait and mobility: Secondary | ICD-10-CM | POA: Diagnosis not present

## 2022-09-17 DIAGNOSIS — N1832 Chronic kidney disease, stage 3b: Secondary | ICD-10-CM | POA: Diagnosis not present

## 2022-09-17 DIAGNOSIS — F02C0 Dementia in other diseases classified elsewhere, severe, without behavioral disturbance, psychotic disturbance, mood disturbance, and anxiety: Secondary | ICD-10-CM | POA: Diagnosis not present

## 2022-09-17 DIAGNOSIS — L600 Ingrowing nail: Secondary | ICD-10-CM | POA: Diagnosis not present

## 2022-09-17 DIAGNOSIS — I872 Venous insufficiency (chronic) (peripheral): Secondary | ICD-10-CM | POA: Diagnosis not present

## 2022-09-17 DIAGNOSIS — S81801A Unspecified open wound, right lower leg, initial encounter: Secondary | ICD-10-CM | POA: Diagnosis not present

## 2022-09-17 DIAGNOSIS — M722 Plantar fascial fibromatosis: Secondary | ICD-10-CM | POA: Diagnosis not present

## 2022-09-17 DIAGNOSIS — R238 Other skin changes: Secondary | ICD-10-CM | POA: Diagnosis not present

## 2022-09-17 DIAGNOSIS — R262 Difficulty in walking, not elsewhere classified: Secondary | ICD-10-CM | POA: Diagnosis not present

## 2022-09-17 DIAGNOSIS — L989 Disorder of the skin and subcutaneous tissue, unspecified: Secondary | ICD-10-CM | POA: Diagnosis not present

## 2022-09-18 DIAGNOSIS — G8929 Other chronic pain: Secondary | ICD-10-CM | POA: Diagnosis not present

## 2022-09-18 DIAGNOSIS — E43 Unspecified severe protein-calorie malnutrition: Secondary | ICD-10-CM | POA: Diagnosis not present

## 2022-09-18 DIAGNOSIS — N189 Chronic kidney disease, unspecified: Secondary | ICD-10-CM | POA: Diagnosis not present

## 2022-09-18 DIAGNOSIS — S81801D Unspecified open wound, right lower leg, subsequent encounter: Secondary | ICD-10-CM | POA: Diagnosis not present

## 2022-09-18 DIAGNOSIS — F32A Depression, unspecified: Secondary | ICD-10-CM | POA: Diagnosis not present

## 2022-09-18 DIAGNOSIS — F0393 Unspecified dementia, unspecified severity, with mood disturbance: Secondary | ICD-10-CM | POA: Diagnosis not present

## 2022-09-19 DIAGNOSIS — E43 Unspecified severe protein-calorie malnutrition: Secondary | ICD-10-CM | POA: Diagnosis not present

## 2022-09-19 DIAGNOSIS — G8929 Other chronic pain: Secondary | ICD-10-CM | POA: Diagnosis not present

## 2022-09-19 DIAGNOSIS — S81801D Unspecified open wound, right lower leg, subsequent encounter: Secondary | ICD-10-CM | POA: Diagnosis not present

## 2022-09-19 DIAGNOSIS — F0393 Unspecified dementia, unspecified severity, with mood disturbance: Secondary | ICD-10-CM | POA: Diagnosis not present

## 2022-09-19 DIAGNOSIS — F32A Depression, unspecified: Secondary | ICD-10-CM | POA: Diagnosis not present

## 2022-09-19 DIAGNOSIS — N189 Chronic kidney disease, unspecified: Secondary | ICD-10-CM | POA: Diagnosis not present

## 2022-09-24 DIAGNOSIS — E43 Unspecified severe protein-calorie malnutrition: Secondary | ICD-10-CM | POA: Diagnosis not present

## 2022-09-24 DIAGNOSIS — S81801D Unspecified open wound, right lower leg, subsequent encounter: Secondary | ICD-10-CM | POA: Diagnosis not present

## 2022-09-24 DIAGNOSIS — F0393 Unspecified dementia, unspecified severity, with mood disturbance: Secondary | ICD-10-CM | POA: Diagnosis not present

## 2022-09-24 DIAGNOSIS — N189 Chronic kidney disease, unspecified: Secondary | ICD-10-CM | POA: Diagnosis not present

## 2022-09-24 DIAGNOSIS — F32A Depression, unspecified: Secondary | ICD-10-CM | POA: Diagnosis not present

## 2022-09-24 DIAGNOSIS — G8929 Other chronic pain: Secondary | ICD-10-CM | POA: Diagnosis not present

## 2022-09-25 DIAGNOSIS — F32A Depression, unspecified: Secondary | ICD-10-CM | POA: Diagnosis not present

## 2022-09-25 DIAGNOSIS — N189 Chronic kidney disease, unspecified: Secondary | ICD-10-CM | POA: Diagnosis not present

## 2022-09-25 DIAGNOSIS — F0393 Unspecified dementia, unspecified severity, with mood disturbance: Secondary | ICD-10-CM | POA: Diagnosis not present

## 2022-09-25 DIAGNOSIS — E43 Unspecified severe protein-calorie malnutrition: Secondary | ICD-10-CM | POA: Diagnosis not present

## 2022-09-25 DIAGNOSIS — S81801D Unspecified open wound, right lower leg, subsequent encounter: Secondary | ICD-10-CM | POA: Diagnosis not present

## 2022-09-25 DIAGNOSIS — G8929 Other chronic pain: Secondary | ICD-10-CM | POA: Diagnosis not present

## 2022-09-27 ENCOUNTER — Other Ambulatory Visit: Payer: Self-pay | Admitting: *Deleted

## 2022-09-27 DIAGNOSIS — G8929 Other chronic pain: Secondary | ICD-10-CM | POA: Diagnosis not present

## 2022-09-27 DIAGNOSIS — E440 Moderate protein-calorie malnutrition: Secondary | ICD-10-CM | POA: Diagnosis not present

## 2022-09-27 MED ORDER — CITALOPRAM HYDROBROMIDE 20 MG PO TABS: 20.0000 mg | ORAL_TABLET | Freq: Every day | ORAL | 3 refills | Status: DC

## 2022-09-27 NOTE — Telephone Encounter (Signed)
Rf request from [facility] for pt's citalopram

## 2022-10-01 ENCOUNTER — Ambulatory Visit: Payer: Medicare Other | Admitting: Dermatology

## 2022-10-01 VITALS — BP 116/72 | HR 86

## 2022-10-01 DIAGNOSIS — L821 Other seborrheic keratosis: Secondary | ICD-10-CM | POA: Diagnosis not present

## 2022-10-01 DIAGNOSIS — W908XXA Exposure to other nonionizing radiation, initial encounter: Secondary | ICD-10-CM | POA: Diagnosis not present

## 2022-10-01 DIAGNOSIS — S81801D Unspecified open wound, right lower leg, subsequent encounter: Secondary | ICD-10-CM | POA: Diagnosis not present

## 2022-10-01 DIAGNOSIS — F32A Depression, unspecified: Secondary | ICD-10-CM | POA: Diagnosis not present

## 2022-10-01 DIAGNOSIS — Z7982 Long term (current) use of aspirin: Secondary | ICD-10-CM | POA: Diagnosis not present

## 2022-10-01 DIAGNOSIS — L570 Actinic keratosis: Secondary | ICD-10-CM

## 2022-10-01 DIAGNOSIS — N189 Chronic kidney disease, unspecified: Secondary | ICD-10-CM | POA: Diagnosis not present

## 2022-10-01 DIAGNOSIS — F0393 Unspecified dementia, unspecified severity, with mood disturbance: Secondary | ICD-10-CM | POA: Diagnosis not present

## 2022-10-01 DIAGNOSIS — L578 Other skin changes due to chronic exposure to nonionizing radiation: Secondary | ICD-10-CM | POA: Diagnosis not present

## 2022-10-01 DIAGNOSIS — G8929 Other chronic pain: Secondary | ICD-10-CM | POA: Diagnosis not present

## 2022-10-01 DIAGNOSIS — C44722 Squamous cell carcinoma of skin of right lower limb, including hip: Secondary | ICD-10-CM | POA: Diagnosis not present

## 2022-10-01 DIAGNOSIS — C4492 Squamous cell carcinoma of skin, unspecified: Secondary | ICD-10-CM

## 2022-10-01 DIAGNOSIS — Z85828 Personal history of other malignant neoplasm of skin: Secondary | ICD-10-CM

## 2022-10-01 DIAGNOSIS — F39 Unspecified mood [affective] disorder: Secondary | ICD-10-CM | POA: Diagnosis not present

## 2022-10-01 DIAGNOSIS — E43 Unspecified severe protein-calorie malnutrition: Secondary | ICD-10-CM | POA: Diagnosis not present

## 2022-10-01 NOTE — Patient Instructions (Addendum)
Cryotherapy Aftercare  Wash gently with soap and water everyday.   Apply Mupirocin 2% ointment and Band-Aid daily until healed.   Due to recent changes in healthcare laws, you may see results of your pathology and/or laboratory studies on MyChart before the doctors have had a chance to review them. We understand that in some cases there may be results that are confusing or concerning to you. Please understand that not all results are received at the same time and often the doctors may need to interpret multiple results in order to provide you with the best plan of care or course of treatment. Therefore, we ask that you please give [place] 2 business days to thoroughly review all your results before contacting the office for clarification. Should we see a critical lab result, you will be contacted sooner.   If You Need Anything After Your Visit  If you have any questions or concerns for your doctor, please call our main line at [phone] and press option 4 to reach your doctor's medical assistant. If no one answers, please leave a voicemail as directed and we will return your call as soon as possible. Messages left after 4 pm will be answered the following business day.   You may also send [place] a message via MyChart. We typically respond to MyChart messages within 1-2 business days.  For prescription refills, please ask your pharmacy to contact our office. Our fax number is [phone].  If you have an urgent issue when the clinic is closed that cannot wait until the next business day, you can page your doctor at the number below.    Please note that while we do our best to be available for urgent issues outside of office hours, we are not available 24/7.   If you have an urgent issue and are unable to reach [place], you may choose to seek medical care at your doctor's office, retail clinic, urgent care center, or emergency room.  If you have a medical emergency, please immediately call 911 or go to the  emergency department.  Pager Numbers  - Dr. Gwen Pounds: [phone]  - Dr. Neale Burly: [phone]  - Dr. Roseanne Reno: [phone]  In the event of inclement weather, please call our main line at [phone] for an update on the status of any delays or closures.  Dermatology Medication Tips: Please keep the boxes that topical medications come in in order to help keep track of the instructions about where and how to use these. Pharmacies typically print the medication instructions only on the boxes and not directly on the medication tubes.   If your medication is too expensive, please contact our office at [phone] option 4 or send [place] a message through MyChart.   We are unable to tell what your co-pay for medications will be in advance as this is different depending on your insurance coverage. However, we may be able to find a substitute medication at lower cost or fill out paperwork to get insurance to cover a needed medication.   If a prior authorization is required to get your medication covered by your insurance company, please allow [place] 1-2 business days to complete this process.  Drug prices often vary depending on where the prescription is filled and some pharmacies may offer cheaper prices.  The website www.goodrx.com contains coupons for medications through different pharmacies. The prices here do not account for what the cost may be with help from insurance (it may be cheaper with your insurance), but the website can  give you the price if you did not use any insurance.  - You can print the associated coupon and take it with your prescription to the pharmacy.  - You may also stop by our office during regular business hours and pick up a GoodRx coupon card.  - If you need your prescription sent electronically to a different pharmacy, notify our office through [place] MyChart or by phone at [phone] option 4.     Si Usted Necesita Algo Despus de Su Visita  Tambin puede  enviarnos un mensaje a travs de MyChart. Por lo general respondemos a los mensajes de MyChart en el transcurso de 1 a 2 das hbiles.  Para renovar recetas, por favor pida a su farmacia que se ponga en contacto con nuestra oficina. Nuestro nmero de fax es el [phone].  Si tiene un asunto urgente cuando la clnica est cerrada y que no puede esperar hasta el siguiente da hbil, puede llamar/localizar a su doctor(a) al nmero que aparece a continuacin.   Por favor, tenga en cuenta que aunque hacemos todo lo posible para estar disponibles para asuntos urgentes fuera del horario de oficina, no estamos disponibles las 24 horas del da, los 7 das de la semana.   Si tiene un problema urgente y no puede comunicarse con nosotros, puede optar por buscar atencin mdica  en el consultorio de su doctor(a), en una clnica privada, en un centro de atencin urgente o en una sala de emergencias.  Si tiene una emergencia mdica, por favor llame inmediatamente al 911 o vaya a la sala de emergencias.  Nmeros de bper  - Dr. Kowalski: [phone]  - Dra. Moye: [phone]  - Dra. Stewart: [phone]  En caso de inclemencias del tiempo, por favor llame a nuestra lnea principal al [phone] para una actualizacin sobre el estado de cualquier retraso o cierre.  Consejos para la medicacin en dermatologa: Por favor, guarde las cajas en las que vienen los medicamentos de uso tpico para ayudarle a seguir las instrucciones sobre dnde y cmo usarlos. Las farmacias generalmente imprimen las instrucciones del medicamento slo en las cajas y no directamente en los tubos del medicamento.   Si su medicamento es muy caro, por favor, pngase en contacto con nuestra oficina llamando al [phone] y presione la opcin 4 o envenos un mensaje a travs de MyChart.   No podemos decirle cul ser su copago por los medicamentos por adelantado ya que esto es diferente dependiendo de la cobertura de su seguro.  Sin embargo, es posible que podamos encontrar un medicamento sustituto a menor costo o llenar un formulario para que el seguro cubra el medicamento que se considera necesario.   Si se requiere una autorizacin previa para que su compaa de seguros cubra su medicamento, por favor permtanos de 1 a 2 das hbiles para completar este proceso.  Los precios de los medicamentos varan con frecuencia dependiendo del lugar de dnde se surte la receta y alguna farmacias pueden ofrecer precios ms baratos.  El sitio web www.goodrx.com tiene cupones para medicamentos de diferentes farmacias. Los precios aqu no tienen en cuenta lo que podra costar con la ayuda del seguro (puede ser ms barato con su seguro), pero el sitio web puede darle el precio si no utiliz ningn seguro.  - Puede imprimir el cupn correspondiente y llevarlo con su receta a la farmacia.  - Tambin puede pasar por nuestra oficina durante el horario de atencin regular y recoger una tarjeta de cupones de GoodRx.  -   Si necesita que su receta se enve electrnicamente a una farmacia diferente, informe a nuestra oficina a travs de MyChart de [place] o por telfono llamando al [phone] y presione la opcin 4.  

## 2022-10-01 NOTE — Progress Notes (Signed)
Follow-Up Visit   Subjective  Laura Wilcox is a 89 y.o. female who presents for the following: follow-up SCC site of the right ankle with history of MRSA, improving She has a history of multiple SCCs. Pt has dementia.  Patient accompanied by daughter, [name].    The following portions of the chart were reviewed this encounter and updated as appropriate: medications, allergies, medical history  Review of Systems:  No other skin or systemic complaints except as noted in HPI or Assessment and Plan.  Objective  Well appearing patient in no apparent distress; mood and affect are within normal limits.  A focused examination was performed of the following areas: Face, arms, legs Relevant physical exam findings are noted in the Assessment and Plan.  right ankle Healing scar with superficial heme-crusting; minimal nodularity.     R cheek x 1, L neck x 1, L mid jaw x 1, base of R 4th finger x 1, base of R thumb x 2, R ulnar hand dorsum x 2 (8) Keratotic papules; 8.20mm keratotic  papule on the left neck    Assessment & Plan  ACTINIC DAMAGE - chronic, secondary to cumulative UV radiation exposure/sun exposure over time - diffuse scaly erythematous macules with underlying dyspigmentation - Recommend daily broad spectrum sunscreen SPF 30+ to sun-exposed areas, reapply every 2 hours as needed.  - Recommend staying in the shade or wearing long sleeves, sun glasses (UVA+UVB protection) and wide brim hats (4-inch brim around the entire circumference of the hat). - Call for new or changing lesions.  Squamous cell carcinoma of skin right ankle  Biopsy proven, treated with EDC. Hx of MRSA infection. Improved Recommend observation for recurrence. Continue mupirocin 2% ointment and keep covered until healed (at least 3-4 more weeks). If recurs, recommend radiation referral.   Hypertrophic actinic keratosis (8) R cheek x 1, L neck x 1, L mid jaw x 1, base of R 4th finger x 1, base of R  thumb x 2, R ulnar hand dorsum x 2  (Vs Possible Recurrent SCCIS Right Ulnar hand dorsum)  Recheck L neck on f/up, recommend biopsy if not improved  Actinic keratoses are precancerous spots that appear secondary to cumulative UV radiation exposure/sun exposure over time. They are chronic with expected duration over 1 year. A portion of actinic keratoses will progress to squamous cell carcinoma of the skin. It is not possible to reliably predict which spots will progress to skin cancer and so treatment is recommended to prevent development of skin cancer.  Recommend daily broad spectrum sunscreen SPF 30+ to sun-exposed areas, reapply every 2 hours as needed.  Recommend staying in the shade or wearing long sleeves, sun glasses (UVA+UVB protection) and wide brim hats (4-inch brim around the entire circumference of the hat). Call for new or changing lesions.  Destruction of lesion - R cheek x 1, L neck x 1, L mid jaw x 1, base of R 4th finger x 1, base of R thumb x 2, R ulnar hand dorsum x 2  Destruction method: cryotherapy   Informed consent: discussed and consent obtained   Lesion destroyed using liquid nitrogen: Yes   Region frozen until ice ball extended beyond lesion: Yes   Outcome: patient tolerated procedure well with no complications   Post-procedure details: wound care instructions given   Additional details:  Prior to procedure, discussed risks of blister formation, small wound, skin dyspigmentation, or rare scar following cryotherapy. Recommend Vaseline ointment to treated areas while healing.  SEBORRHEIC KERATOSIS - Stuck-on, waxy, tan-brown papules and/or plaques  - Benign-appearing - Discussed benign etiology and prognosis. - Observe - Call for any changes  HISTORY OF SQUAMOUS CELL CARCINOMA OF THE SKIN - No evidence of recurrence today - Recommend regular full body skin exams - Recommend daily broad spectrum sunscreen SPF 30+ to sun-exposed areas, reapply every 2 hours  as needed.  - Call if any new or changing lesions are noted between office visits  Return in about 3 months (around 01/01/2023) for Hx SCC, Hx AKs.  ICherlyn Labella, CMA, am acting as scribe for Willeen Niece, MD .   Documentation: I have reviewed the above documentation for accuracy and completeness, and I agree with the above.  Willeen Niece, MD

## 2022-10-02 ENCOUNTER — Inpatient Hospital Stay (HOSPITAL_BASED_OUTPATIENT_CLINIC_OR_DEPARTMENT_OTHER): Payer: Medicare Other | Admitting: Oncology

## 2022-10-02 ENCOUNTER — Other Ambulatory Visit (HOSPITAL_COMMUNITY): Payer: Self-pay

## 2022-10-02 ENCOUNTER — Encounter: Payer: Self-pay | Admitting: Oncology

## 2022-10-02 ENCOUNTER — Other Ambulatory Visit: Payer: Self-pay

## 2022-10-02 ENCOUNTER — Inpatient Hospital Stay: Payer: Medicare Other

## 2022-10-02 ENCOUNTER — Inpatient Hospital Stay: Payer: Medicare Other | Attending: Oncology

## 2022-10-02 VITALS — BP 136/71 | HR 66 | Temp 97.2°F | Ht 60.0 in | Wt 120.0 lb

## 2022-10-02 DIAGNOSIS — F32A Depression, unspecified: Secondary | ICD-10-CM | POA: Diagnosis not present

## 2022-10-02 DIAGNOSIS — D709 Neutropenia, unspecified: Secondary | ICD-10-CM | POA: Insufficient documentation

## 2022-10-02 DIAGNOSIS — D631 Anemia in chronic kidney disease: Secondary | ICD-10-CM | POA: Diagnosis not present

## 2022-10-02 DIAGNOSIS — Z803 Family history of malignant neoplasm of breast: Secondary | ICD-10-CM | POA: Diagnosis not present

## 2022-10-02 DIAGNOSIS — S81801D Unspecified open wound, right lower leg, subsequent encounter: Secondary | ICD-10-CM | POA: Diagnosis not present

## 2022-10-02 DIAGNOSIS — Z79899 Other long term (current) drug therapy: Secondary | ICD-10-CM | POA: Diagnosis not present

## 2022-10-02 DIAGNOSIS — R531 Weakness: Secondary | ICD-10-CM | POA: Diagnosis not present

## 2022-10-02 DIAGNOSIS — E876 Hypokalemia: Secondary | ICD-10-CM | POA: Insufficient documentation

## 2022-10-02 DIAGNOSIS — N1832 Chronic kidney disease, stage 3b: Secondary | ICD-10-CM

## 2022-10-02 DIAGNOSIS — Z8249 Family history of ischemic heart disease and other diseases of the circulatory system: Secondary | ICD-10-CM | POA: Insufficient documentation

## 2022-10-02 DIAGNOSIS — R5383 Other fatigue: Secondary | ICD-10-CM | POA: Insufficient documentation

## 2022-10-02 DIAGNOSIS — D471 Chronic myeloproliferative disease: Secondary | ICD-10-CM | POA: Diagnosis not present

## 2022-10-02 DIAGNOSIS — F0390 Unspecified dementia without behavioral disturbance: Secondary | ICD-10-CM | POA: Insufficient documentation

## 2022-10-02 DIAGNOSIS — Z87440 Personal history of urinary (tract) infections: Secondary | ICD-10-CM | POA: Diagnosis not present

## 2022-10-02 DIAGNOSIS — Z818 Family history of other mental and behavioral disorders: Secondary | ICD-10-CM | POA: Diagnosis not present

## 2022-10-02 DIAGNOSIS — D7581 Myelofibrosis: Secondary | ICD-10-CM

## 2022-10-02 DIAGNOSIS — R4189 Other symptoms and signs involving cognitive functions and awareness: Secondary | ICD-10-CM | POA: Insufficient documentation

## 2022-10-02 DIAGNOSIS — D45 Polycythemia vera: Secondary | ICD-10-CM | POA: Diagnosis not present

## 2022-10-02 DIAGNOSIS — F0393 Unspecified dementia, unspecified severity, with mood disturbance: Secondary | ICD-10-CM | POA: Diagnosis not present

## 2022-10-02 DIAGNOSIS — Z85828 Personal history of other malignant neoplasm of skin: Secondary | ICD-10-CM | POA: Insufficient documentation

## 2022-10-02 DIAGNOSIS — G8929 Other chronic pain: Secondary | ICD-10-CM | POA: Diagnosis not present

## 2022-10-02 DIAGNOSIS — L989 Disorder of the skin and subcutaneous tissue, unspecified: Secondary | ICD-10-CM | POA: Insufficient documentation

## 2022-10-02 DIAGNOSIS — E43 Unspecified severe protein-calorie malnutrition: Secondary | ICD-10-CM | POA: Diagnosis not present

## 2022-10-02 DIAGNOSIS — Z90710 Acquired absence of both cervix and uterus: Secondary | ICD-10-CM | POA: Insufficient documentation

## 2022-10-02 DIAGNOSIS — Z881 Allergy status to other antibiotic agents status: Secondary | ICD-10-CM | POA: Diagnosis not present

## 2022-10-02 DIAGNOSIS — N189 Chronic kidney disease, unspecified: Secondary | ICD-10-CM | POA: Insufficient documentation

## 2022-10-02 LAB — CBC WITH DIFFERENTIAL (CANCER CENTER ONLY)
Abs Immature Granulocytes: 0.82 10*3/uL — ABNORMAL HIGH (ref 0.00–0.07)
Basophils Absolute: 0.2 10*3/uL — ABNORMAL HIGH (ref 0.0–0.1)
Basophils Relative: 2 %
Eosinophils Absolute: 0.5 10*3/uL (ref 0.0–0.5)
Eosinophils Relative: 4 %
HCT: 33 % — ABNORMAL LOW (ref 36.0–46.0)
Hemoglobin: 10.4 g/dL — ABNORMAL LOW (ref 12.0–15.0)
Immature Granulocytes: 7 %
Lymphocytes Relative: 11 %
Lymphs Abs: 1.3 10*3/uL (ref 0.7–4.0)
MCH: 29.6 pg (ref 26.0–34.0)
MCHC: 31.5 g/dL (ref 30.0–36.0)
MCV: 94 fL (ref 80.0–100.0)
Monocytes Absolute: 0.9 10*3/uL (ref 0.1–1.0)
Monocytes Relative: 8 %
Neutro Abs: 7.9 10*3/uL — ABNORMAL HIGH (ref 1.7–7.7)
Neutrophils Relative %: 68 %
Platelet Count: 747 10*3/uL — ABNORMAL HIGH (ref 150–400)
RBC: 3.51 MIL/uL — ABNORMAL LOW (ref 3.87–5.11)
RDW: 22.8 % — ABNORMAL HIGH (ref 11.5–15.5)
Smear Review: INCREASED
WBC Count: 11.7 10*3/uL — ABNORMAL HIGH (ref 4.0–10.5)
nRBC: 0.2 % (ref 0.0–0.2)

## 2022-10-02 LAB — CMP (CANCER CENTER ONLY)
ALT: 19 U/L (ref 0–44)
AST: 24 U/L (ref 15–41)
Albumin: 3.8 g/dL (ref 3.5–5.0)
Alkaline Phosphatase: 91 U/L (ref 38–126)
Anion gap: 11 (ref 5–15)
BUN: 27 mg/dL — ABNORMAL HIGH (ref 8–23)
CO2: 24 mmol/L (ref 22–32)
Calcium: 8.9 mg/dL (ref 8.9–10.3)
Chloride: 101 mmol/L (ref 98–111)
Creatinine: 1.48 mg/dL — ABNORMAL HIGH (ref 0.44–1.00)
GFR, Estimated: 33 mL/min — ABNORMAL LOW (ref >60)
Glucose, Bld: 182 mg/dL — ABNORMAL HIGH (ref 70–99)
Potassium: 4.1 mmol/L (ref 3.5–5.1)
Sodium: 136 mmol/L (ref 135–145)
Total Bilirubin: 0.2 mg/dL — ABNORMAL LOW (ref 0.3–1.2)
Total Protein: 6.7 g/dL (ref 6.5–8.1)

## 2022-10-02 MED ORDER — RUXOLITINIB PHOSPHATE 5 MG PO TABS
5.0000 mg | ORAL_TABLET | Freq: Two times a day (BID) | ORAL | 2 refills | Status: AC
Start: 2022-10-02 — End: ?
  Filled 2022-10-02 – 2022-12-05 (×2): qty 60, 30d supply, fill #0

## 2022-10-02 NOTE — Progress Notes (Signed)
Hematology/Oncology Progress note [phone] [phone]     ASSESSMENT & PLAN:   Hypokalemia Resolved   Myelofibrosis (HCC) Polycythemia vera and  secondary myelofibrosis. Labs are reviewed and discussed with patient. Platelet count is in 700s range, due to cytopenia, I recommend to continue same regimen Jakafi 5 mg in the morning, 5 mg in the evening.   Continue aspirin 81 mg daily.  Anemia in chronic kidney disease Anemia due to CKD, and myelofibrosis.   Monthly Retacrit 20,000 units, hold if Hb >=10  Orders Placed This Encounter  Procedures   CBC with Differential (Cancer Center Only)    Standing Status:   Future    Standing Expiration Date:   10/02/2023   CBC with Differential (Cancer Center Only)    Standing Status:   Future    Standing Expiration Date:   10/02/2023   CBC with Differential (Cancer Center Only)    Standing Status:   Future    Standing Expiration Date:   10/02/2023   Iron and TIBC    Standing Status:   Future    Standing Expiration Date:   10/02/2023   Ferritin    Standing Status:   Future    Standing Expiration Date:   10/02/2023   CMP (Cancer Center only)    Standing Status:   Future    Standing Expiration Date:   10/02/2023   Follow up  Labs in 4 weeks, 8 weeks -cbc +/- retacrit  Follow up in 12 weeks, lab prior to MD +/- retacrit.   All questions were answered. The patient knows to call the clinic with any problems, questions or concerns.  Laura Patience, MD, PhD [facility] Hematology Oncology 10/02/2022      Chief Complaint: Laura Wilcox is a 89 y.o. female with polycythemia vera (PV) and secondary myelofibrosis presents for follow up   PERTINENT ONCOLOGY HISTORY Laura Wilcox is a 89 y.o.afemale who has above oncology history reviewed by me today presented for follow up visit for management of  polycythemia vera  Patient previously followed up by Dr.Corcoran, patient switched care to me on 11/16/20 Extensive medical  record review was performed by me  Feb 2014 polycythemia vera and secondary myelofibrosis.  She presented in 04/2002 with thrombocytosis (835,000).  JAK2 V617F was positive   Bone marrow was on 04/15/2002 revealed a normocellular marrow for age with trilineage hematopoiesis and mild megakaryocytic hyperplasia with focal atypia.  There was no morphologic evidence of a myeloproliferative disorder.  Iron stains were inadequate for evaluation of storage iron.  There was no increase in reticulin fibers.  She was started on Agrylin in 2012  Bone marrow on 09/25/2012 revealed persistent myeloproliferative disorder s/p Agrylin.  The current features were best classified as persistent myeloproliferative neoplasm (MPN) JAK2 V617F mutation positive.  The current features of markedly increased atypical megakaryocytes paresis, with frequent clustering with frequent hyper/deeply lobulated forms, bizarre forms and forms with staghorn nuclei, hyperchromatic forms and abundant cytoplasm was highly suggestive of a myeloproliferative neoplasm.  Marrow was hypercellular for age (30-50%) with increased trilineage hematopoiesis and markedly increased atypical megakaryopoiesis with frequent clustering.  There were no increased blasts.  Iron stores were absent.  There was moderate to severe myelofibrosis (grade MF 2-3).  Differential includes ET, PV, primary myelofibrosis, and MPN, unclassifiable.  Cytogenetics were normal (46, XX). She developed pancytopenia in 12/2015.  Etiology is unclear (medication confusion or hydroxyurea + Agrylin).   She received Procrit 40,000 units (intermittently 09/2012 - 06/2016), Venofer (09/2012 -  10/2012), and Infed (12/2015 - 03/2016).  She received Neupogen during a period of neutropenia.   She was anagrelide from 2004 - 08/13/2017.  She began [place] on 08/14/2017.  Jakafi was increased from 5 mg BID to 10 mg BID on 06/17/2018.  Jakafi was increased to 15 mg BID on 08/03/2018.  She has been back  on Jakafi 5 mg BID since 02/01/2019 then 10 mg BID on 05/25/2019.  Earvin Hansen was on hold from 12/20/2019 - 01/12/2020.  Jakafi 5 mg twice daily restarted on 01/12/2020 then increased to 10 mg in the morning and 5 mg in the evening on 02/02/2020. Earvin Hansen is currently 20 mg po BID.  She has received Retacrit to maintain a hemoglobin > 10 (12/24/2019 and 03   Squamous cell carcinoma left cheek- s/p cryotherapy and debridement. Managed by Dr. Gwen Pounds  10/22/2020- 10/26/2020 Hospitalization due to acute metabolic encephalopathy, UTI,E. coli bacteremia AKI on CKD, acute on chronic anemia. Earvin Hansen was held during the admission. She was discharged and spent a short period of time in rehab. Alta Corning was held.   11/06/2020 seen by symptom management for weakness.  11/16/2020 established care with me. Resumed Jakafi at 10mg  BID  #Dementia/cognitive impairment- patient has hx of dementia. Per daughter, it has been difficult to convince patent to see neurology for further evaluation. She is not on any medication for her memory loss.  She has been referred to home health.  06/25/2021 - 07/02/2021,Patient was hospitalized due to UTI, generalized weakness Jakafi was.  Held. 07/25/2021, resumed on Jakafi 5 mg daily.  07/25/2021, bilateral lower extremity ultrasound were negative for DVT. 08/20/2021, increased Jakafi to 5 mg twice daily. INTERVAL HISTORY Laura Wilcox is a 89 y.o. female who has above history reviewed by me today presents for follow up visit for management of polycythemia vera and secondary myelofibrosis.   Patient was accompanied by daughter [name]  + fatigue  No new complaints.    Past Medical History:  Diagnosis Date   Acute metabolic encephalopathy 10/22/2020   Confusion 12/30/2018   Polycythemia    SCC (squamous cell carcinoma) 04/11/2021   R med dorsum hand - ED&C SCCIS   SCC (squamous cell carcinoma) 10/09/2021   left lateral eye, EDC   SCC (squamous cell carcinoma) 01/28/2022   left  lateral eye, recurrent, EDC. 5FU/Calcipotriene cream.   SCC (squamous cell carcinoma) 01/28/2022   in situ, left medial cheek, 5FU/Calcipotriene Cr   SCC (squamous cell carcinoma) 02/26/2022   SCC IS, Right Ulnar Dorsal Hand, 5FU/Calcipotriene cream   SCC (squamous cell carcinoma) 09/03/2022   R ankle, EDC   Squamous cell carcinoma of skin 09/06/2020   R thumb - ED&C   Urinary tract infection without hematuria 01/02/2020   Urticaria 06/02/2017    Past Surgical History:  Procedure Laterality Date   ABDOMINAL HYSTERECTOMY     complete   BREAST SURGERY      Family History  Problem Relation Age of Onset   Heart disease Father    Cancer Sister        breast   Cancer Sister        breast   Alzheimer's disease Sister     Social History:  reports that she has never smoked. She has never used smokeless tobacco. She reports that she does not drink alcohol and does not use drugs.   Allergies:  Allergies  Allergen Reactions   Bactrim [Sulfamethoxazole-Trimethoprim]     Due to kidney disease    Epinephrine Other (See Comments)  Current Medications: Current Outpatient Medications  Medication Sig Dispense Refill   acetaminophen (TYLENOL) 325 MG tablet Take 325-650 mg by mouth every 6 (six) hours as needed for mild pain or moderate pain.     aspirin EC 81 MG tablet Take 81 mg by mouth daily. Swallow whole.     calcitRIOL (ROCALTROL) 0.25 MCG capsule Take 0.25 mcg by mouth every Monday, Wednesday, and Friday.     citalopram (CELEXA) 20 MG tablet Take 1 tablet (20 mg total) by mouth daily. 30 tablet 3   feeding supplement (ENSURE ENLIVE / ENSURE PLUS) LIQD Take 237 mLs by mouth 2 (two) times daily between meals. 237 mL 12   mupirocin ointment (BACTROBAN) 2 % Apply 1 Application topically daily. Qd to wound on right ankle until healed 22 g 1   polyethylene glycol (MIRALAX / GLYCOLAX) 17 g packet Take 17 g by mouth daily as needed for mild constipation. 14 each 0   vitamin B-12  (CYANOCOBALAMIN) 1000 MCG tablet TAKE 1 TABLET BY MOUTH DAILY 30 tablet 1   potassium chloride SA (KLOR-CON M) 20 MEQ tablet Take 1 tablet (20 mEq total) by mouth 2 (two) times daily. (Patient not taking: Reported on 09/03/2022) 14 tablet 0   ruxolitinib phosphate (JAKAFI) 5 MG tablet Take 1 tablet (5 mg total) by mouth 2 (two) times daily. 60 tablet 2   VELTASSA 16.8 g PACK Take by mouth. (Patient not taking: Reported on 05/17/2022)     No current facility-administered medications for this visit.    Review of Systems  Unable to perform ROS: Dementia    Performance status (ECOG):  2  Vitals Blood pressure 136/71, pulse 66, temperature (!) 97.2 F (36.2 C), temperature source Tympanic, height 5' (1.524 m), weight 120 lb (54.4 kg), SpO2 97 %.   Physical Exam Vitals reviewed.  Constitutional:      General: She is not in acute distress.    Appearance: She is well-developed.     Comments: Frail appearance, patient sits in the wheelchair  Eyes:     General: No scleral icterus.    Conjunctiva/sclera: Conjunctivae normal.  Cardiovascular:     Rate and Rhythm: Normal rate.  Pulmonary:     Effort: Pulmonary effort is normal. No respiratory distress.     Breath sounds: No wheezing.  Abdominal:     General: There is no distension.     Palpations: Abdomen is soft.     Tenderness: There is no abdominal tenderness.  Musculoskeletal:        General: No deformity. Normal range of motion.  Skin:    General: Skin is warm and dry.     Coloration: Skin is not pale.     Comments: Left cheek skin lesion  Neurological:     Mental Status: She is alert. Mental status is at baseline.  Psychiatric:        Attention and Perception: Attention normal.        Mood and Affect: Mood and affect normal.        Behavior: Behavior is cooperative.        Cognition and Memory: Cognition is impaired. Memory is impaired.    Labs    Latest Ref Rng & Units 10/02/2022    1:15 PM 09/04/2022    2:51 PM 08/12/2022    12:50 PM  CBC  WBC 4.0 - 10.5 K/uL 11.7  12.2  10.9   Hemoglobin 12.0 - 15.0 g/dL 57.3  9.6  9.0   Hematocrit 36.0 -  46.0 % 33.0  31.0  29.0   Platelets 150 - 400 K/uL 747  801  775       Latest Ref Rng & Units 10/02/2022    1:14 PM 07/10/2022    1:08 PM 07/08/2022    1:18 PM  CMP  Glucose 70 - 99 mg/dL 914  782  956   BUN 8 - 23 mg/dL 27  31  37   Creatinine 0.44 - 1.00 mg/dL 2.13  0.86  5.78   Sodium 135 - 145 mmol/L 136  135  136   Potassium 3.5 - 5.1 mmol/L 4.1  3.4  2.7   Chloride 98 - 111 mmol/L 101  104  103   CO2 22 - 32 mmol/L 24  23  24    Calcium 8.9 - 10.3 mg/dL 8.9  8.4  8.8   Total Protein 6.5 - 8.1 g/dL 6.7   6.2   Total Bilirubin 0.3 - 1.2 mg/dL 0.2   0.4   Alkaline Phos 38 - 126 U/L 91   93   AST 15 - 41 U/L 24   22   ALT 0 - 44 U/L 19   16

## 2022-10-02 NOTE — Progress Notes (Signed)
C/o feeling faint at times.

## 2022-10-02 NOTE — Assessment & Plan Note (Addendum)
Resolved

## 2022-10-02 NOTE — Assessment & Plan Note (Signed)
Anemia due to CKD, and myelofibrosis.   Monthly Retacrit 20,000 units, hold if Hb >=10

## 2022-10-02 NOTE — Assessment & Plan Note (Signed)
Polycythemia vera and  secondary myelofibrosis. Labs are reviewed and discussed with patient. Platelet count is in 700s range, due to cytopenia, I recommend to continue same regimen Jakafi 5 mg in the morning, 5 mg in the evening.   Continue aspirin 81 mg daily. 

## 2022-10-07 DIAGNOSIS — F32A Depression, unspecified: Secondary | ICD-10-CM | POA: Diagnosis not present

## 2022-10-07 DIAGNOSIS — F0393 Unspecified dementia, unspecified severity, with mood disturbance: Secondary | ICD-10-CM | POA: Diagnosis not present

## 2022-10-07 DIAGNOSIS — E43 Unspecified severe protein-calorie malnutrition: Secondary | ICD-10-CM | POA: Diagnosis not present

## 2022-10-07 DIAGNOSIS — S81801D Unspecified open wound, right lower leg, subsequent encounter: Secondary | ICD-10-CM | POA: Diagnosis not present

## 2022-10-07 DIAGNOSIS — G8929 Other chronic pain: Secondary | ICD-10-CM | POA: Diagnosis not present

## 2022-10-07 DIAGNOSIS — N189 Chronic kidney disease, unspecified: Secondary | ICD-10-CM | POA: Diagnosis not present

## 2022-10-08 DIAGNOSIS — F32A Depression, unspecified: Secondary | ICD-10-CM | POA: Diagnosis not present

## 2022-10-08 DIAGNOSIS — F0393 Unspecified dementia, unspecified severity, with mood disturbance: Secondary | ICD-10-CM | POA: Diagnosis not present

## 2022-10-08 DIAGNOSIS — N189 Chronic kidney disease, unspecified: Secondary | ICD-10-CM | POA: Diagnosis not present

## 2022-10-08 DIAGNOSIS — S81801D Unspecified open wound, right lower leg, subsequent encounter: Secondary | ICD-10-CM | POA: Diagnosis not present

## 2022-10-08 DIAGNOSIS — E43 Unspecified severe protein-calorie malnutrition: Secondary | ICD-10-CM | POA: Diagnosis not present

## 2022-10-08 DIAGNOSIS — G8929 Other chronic pain: Secondary | ICD-10-CM | POA: Diagnosis not present

## 2022-10-09 DIAGNOSIS — E440 Moderate protein-calorie malnutrition: Secondary | ICD-10-CM | POA: Diagnosis not present

## 2022-10-09 DIAGNOSIS — R269 Unspecified abnormalities of gait and mobility: Secondary | ICD-10-CM | POA: Diagnosis not present

## 2022-10-09 DIAGNOSIS — G301 Alzheimer's disease with late onset: Secondary | ICD-10-CM | POA: Diagnosis not present

## 2022-10-09 DIAGNOSIS — F02C11 Dementia in other diseases classified elsewhere, severe, with agitation: Secondary | ICD-10-CM | POA: Diagnosis not present

## 2022-10-09 DIAGNOSIS — N1832 Chronic kidney disease, stage 3b: Secondary | ICD-10-CM | POA: Diagnosis not present

## 2022-10-10 ENCOUNTER — Other Ambulatory Visit (HOSPITAL_COMMUNITY): Payer: Self-pay

## 2022-10-10 DIAGNOSIS — G301 Alzheimer's disease with late onset: Secondary | ICD-10-CM | POA: Diagnosis not present

## 2022-10-10 DIAGNOSIS — F339 Major depressive disorder, recurrent, unspecified: Secondary | ICD-10-CM | POA: Diagnosis not present

## 2022-10-15 ENCOUNTER — Other Ambulatory Visit: Payer: Self-pay

## 2022-10-15 DIAGNOSIS — F32A Depression, unspecified: Secondary | ICD-10-CM | POA: Diagnosis not present

## 2022-10-15 DIAGNOSIS — G8929 Other chronic pain: Secondary | ICD-10-CM | POA: Diagnosis not present

## 2022-10-15 DIAGNOSIS — S81801D Unspecified open wound, right lower leg, subsequent encounter: Secondary | ICD-10-CM | POA: Diagnosis not present

## 2022-10-15 DIAGNOSIS — F0393 Unspecified dementia, unspecified severity, with mood disturbance: Secondary | ICD-10-CM | POA: Diagnosis not present

## 2022-10-15 DIAGNOSIS — N189 Chronic kidney disease, unspecified: Secondary | ICD-10-CM | POA: Diagnosis not present

## 2022-10-15 DIAGNOSIS — E43 Unspecified severe protein-calorie malnutrition: Secondary | ICD-10-CM | POA: Diagnosis not present

## 2022-10-16 DIAGNOSIS — S81801D Unspecified open wound, right lower leg, subsequent encounter: Secondary | ICD-10-CM | POA: Diagnosis not present

## 2022-10-16 DIAGNOSIS — N189 Chronic kidney disease, unspecified: Secondary | ICD-10-CM | POA: Diagnosis not present

## 2022-10-16 DIAGNOSIS — F0393 Unspecified dementia, unspecified severity, with mood disturbance: Secondary | ICD-10-CM | POA: Diagnosis not present

## 2022-10-16 DIAGNOSIS — E43 Unspecified severe protein-calorie malnutrition: Secondary | ICD-10-CM | POA: Diagnosis not present

## 2022-10-16 DIAGNOSIS — G8929 Other chronic pain: Secondary | ICD-10-CM | POA: Diagnosis not present

## 2022-10-16 DIAGNOSIS — F32A Depression, unspecified: Secondary | ICD-10-CM | POA: Diagnosis not present

## 2022-10-17 ENCOUNTER — Other Ambulatory Visit: Payer: Self-pay

## 2022-10-18 ENCOUNTER — Other Ambulatory Visit: Payer: Self-pay | Admitting: Internal Medicine

## 2022-10-18 DIAGNOSIS — D531 Other megaloblastic anemias, not elsewhere classified: Secondary | ICD-10-CM | POA: Diagnosis not present

## 2022-10-18 DIAGNOSIS — F339 Major depressive disorder, recurrent, unspecified: Secondary | ICD-10-CM | POA: Diagnosis not present

## 2022-10-18 NOTE — Telephone Encounter (Signed)
Requested medications are due for refill today.  no  Requested medications are on the active medications list.  yes  Last refill. 09/27/2022 #30 3 rf  Future visit scheduled.   no  Notes to clinic.  Pt is more than 3 months overdue for an ov. Rx signed by Laurette Schimke.   To pharmacy: [facility] IS [facility]KING FOR [facility]FILL & PRESCRIBING DR (JOSHUA [phone]) IS NOT RESPONDING TO REQUEST.   Requested Prescriptions  Pending Prescriptions Disp Refills   citalopram (CELEXA) 20 MG tablet [Pharmacy Med Name: CITALOPRAM HYDROBROMIDE 20 MG TAB] 30 tablet 3    Sig: TAKE 1 TABLET BY MOUTH EVERY DAY     Psychiatry:  Antidepressants - SSRI Failed - 10/18/2022  2:43 PM      Failed - Completed PHQ-2 or PHQ-9 in the last 360 days      Failed - Valid encounter within last 6 months    Recent Outpatient Visits           1 year ago Age-related cognitive decline   [facility] [facility], Nyoka Cowden, MD   1 year ago Confusion and disorientation   [facility], Nyoka Cowden, MD   1 year ago Acute renal failure superimposed on stage 3b chronic kidney disease, unspecified acute renal failure type [facility])   [name] [facility] at [facility], Nyoka Cowden, MD   2 years ago Recurrent UTI   [facility] at [facility], Nyoka Cowden, MD   2 years ago Back strain, initial encounter   [facility] at [facility], Nyoka Cowden, MD       Future Appointments             In 3 months Willeen Niece, MD [facility]

## 2022-10-21 NOTE — Telephone Encounter (Signed)
Spoke with daughter on the phone she said her mother  has been placed at the [facility] [place] with a new pcp which they will continue prescribing  her medications.

## 2022-10-21 NOTE — Telephone Encounter (Signed)
Noted  KP 

## 2022-10-22 DIAGNOSIS — S81801D Unspecified open wound, right lower leg, subsequent encounter: Secondary | ICD-10-CM | POA: Diagnosis not present

## 2022-10-22 DIAGNOSIS — F0393 Unspecified dementia, unspecified severity, with mood disturbance: Secondary | ICD-10-CM | POA: Diagnosis not present

## 2022-10-22 DIAGNOSIS — E43 Unspecified severe protein-calorie malnutrition: Secondary | ICD-10-CM | POA: Diagnosis not present

## 2022-10-22 DIAGNOSIS — G8929 Other chronic pain: Secondary | ICD-10-CM | POA: Diagnosis not present

## 2022-10-22 DIAGNOSIS — N189 Chronic kidney disease, unspecified: Secondary | ICD-10-CM | POA: Diagnosis not present

## 2022-10-22 DIAGNOSIS — F32A Depression, unspecified: Secondary | ICD-10-CM | POA: Diagnosis not present

## 2022-10-23 DIAGNOSIS — E43 Unspecified severe protein-calorie malnutrition: Secondary | ICD-10-CM | POA: Diagnosis not present

## 2022-10-23 DIAGNOSIS — G8929 Other chronic pain: Secondary | ICD-10-CM | POA: Diagnosis not present

## 2022-10-23 DIAGNOSIS — S81801D Unspecified open wound, right lower leg, subsequent encounter: Secondary | ICD-10-CM | POA: Diagnosis not present

## 2022-10-23 DIAGNOSIS — F32A Depression, unspecified: Secondary | ICD-10-CM | POA: Diagnosis not present

## 2022-10-23 DIAGNOSIS — F0393 Unspecified dementia, unspecified severity, with mood disturbance: Secondary | ICD-10-CM | POA: Diagnosis not present

## 2022-10-23 DIAGNOSIS — N189 Chronic kidney disease, unspecified: Secondary | ICD-10-CM | POA: Diagnosis not present

## 2022-10-25 ENCOUNTER — Encounter: Payer: Self-pay | Admitting: Hematology and Oncology

## 2022-10-25 ENCOUNTER — Other Ambulatory Visit (HOSPITAL_COMMUNITY): Payer: Self-pay

## 2022-10-25 ENCOUNTER — Telehealth: Payer: Self-pay

## 2022-10-25 NOTE — Telephone Encounter (Signed)
Oral Oncology Patient Advocate Encounter   Was successful in securing patient an $3,250.00 grant from [employer] [facility]) to provide copayment coverage for Jakafi.  This will keep the out of pocket expense at $0.      The billing information is as follows and has been shared with [facility].   Member ID: [id number] Group ID:  RxBin: [id number] Dates of Eligibility: 09/29/22 through 09/28/23  Fund:  [place] Chromosome Negative Myeloproliferative Neoplasm    Ardeen Fillers, CPhT Oncology Pharmacy Patient Advocate  [facility]  [phone] (phone) [phone] (fax) 10/25/2022 9:35 AM

## 2022-10-28 DIAGNOSIS — F0393 Unspecified dementia, unspecified severity, with mood disturbance: Secondary | ICD-10-CM | POA: Diagnosis not present

## 2022-10-28 DIAGNOSIS — F0390 Unspecified dementia without behavioral disturbance: Secondary | ICD-10-CM | POA: Diagnosis not present

## 2022-10-28 DIAGNOSIS — F32A Depression, unspecified: Secondary | ICD-10-CM | POA: Diagnosis not present

## 2022-10-28 DIAGNOSIS — R54 Age-related physical debility: Secondary | ICD-10-CM | POA: Diagnosis not present

## 2022-10-28 DIAGNOSIS — E43 Unspecified severe protein-calorie malnutrition: Secondary | ICD-10-CM | POA: Diagnosis not present

## 2022-10-28 DIAGNOSIS — Z515 Encounter for palliative care: Secondary | ICD-10-CM | POA: Diagnosis not present

## 2022-10-28 DIAGNOSIS — D45 Polycythemia vera: Secondary | ICD-10-CM | POA: Diagnosis not present

## 2022-10-28 DIAGNOSIS — S81801D Unspecified open wound, right lower leg, subsequent encounter: Secondary | ICD-10-CM | POA: Diagnosis not present

## 2022-10-28 DIAGNOSIS — N189 Chronic kidney disease, unspecified: Secondary | ICD-10-CM | POA: Diagnosis not present

## 2022-10-28 DIAGNOSIS — G8929 Other chronic pain: Secondary | ICD-10-CM | POA: Diagnosis not present

## 2022-10-28 DIAGNOSIS — E640 Sequelae of protein-calorie malnutrition: Secondary | ICD-10-CM | POA: Diagnosis not present

## 2022-10-30 ENCOUNTER — Inpatient Hospital Stay: Payer: Medicare Other

## 2022-10-30 DIAGNOSIS — D471 Chronic myeloproliferative disease: Secondary | ICD-10-CM | POA: Diagnosis not present

## 2022-10-30 DIAGNOSIS — N189 Chronic kidney disease, unspecified: Secondary | ICD-10-CM | POA: Diagnosis not present

## 2022-10-30 DIAGNOSIS — S81801D Unspecified open wound, right lower leg, subsequent encounter: Secondary | ICD-10-CM | POA: Diagnosis not present

## 2022-10-30 DIAGNOSIS — R531 Weakness: Secondary | ICD-10-CM | POA: Diagnosis not present

## 2022-10-30 DIAGNOSIS — E876 Hypokalemia: Secondary | ICD-10-CM | POA: Diagnosis not present

## 2022-10-30 DIAGNOSIS — G8929 Other chronic pain: Secondary | ICD-10-CM | POA: Diagnosis not present

## 2022-10-30 DIAGNOSIS — D631 Anemia in chronic kidney disease: Secondary | ICD-10-CM | POA: Diagnosis not present

## 2022-10-30 DIAGNOSIS — F32A Depression, unspecified: Secondary | ICD-10-CM | POA: Diagnosis not present

## 2022-10-30 DIAGNOSIS — F0393 Unspecified dementia, unspecified severity, with mood disturbance: Secondary | ICD-10-CM | POA: Diagnosis not present

## 2022-10-30 DIAGNOSIS — D45 Polycythemia vera: Secondary | ICD-10-CM

## 2022-10-30 DIAGNOSIS — E43 Unspecified severe protein-calorie malnutrition: Secondary | ICD-10-CM | POA: Diagnosis not present

## 2022-10-30 LAB — CBC WITH DIFFERENTIAL (CANCER CENTER ONLY)
Abs Immature Granulocytes: 0.94 10*3/uL — ABNORMAL HIGH (ref 0.00–0.07)
Basophils Absolute: 0.3 10*3/uL — ABNORMAL HIGH (ref 0.0–0.1)
Basophils Relative: 2 %
Eosinophils Absolute: 0.6 10*3/uL — ABNORMAL HIGH (ref 0.0–0.5)
Eosinophils Relative: 5 %
HCT: 32.9 % — ABNORMAL LOW (ref 36.0–46.0)
Hemoglobin: 10 g/dL — ABNORMAL LOW (ref 12.0–15.0)
Immature Granulocytes: 8 %
Lymphocytes Relative: 15 %
Lymphs Abs: 1.8 10*3/uL (ref 0.7–4.0)
MCH: 28.9 pg (ref 26.0–34.0)
MCHC: 30.4 g/dL (ref 30.0–36.0)
MCV: 95.1 fL (ref 80.0–100.0)
Monocytes Absolute: 1.2 10*3/uL — ABNORMAL HIGH (ref 0.1–1.0)
Monocytes Relative: 10 %
Neutro Abs: 7.6 10*3/uL (ref 1.7–7.7)
Neutrophils Relative %: 60 %
Platelet Count: 623 10*3/uL — ABNORMAL HIGH (ref 150–400)
RBC: 3.46 MIL/uL — ABNORMAL LOW (ref 3.87–5.11)
RDW: 22 % — ABNORMAL HIGH (ref 11.5–15.5)
Smear Review: INCREASED
WBC Count: 12.4 10*3/uL — ABNORMAL HIGH (ref 4.0–10.5)
nRBC: 0.2 % (ref 0.0–0.2)

## 2022-10-30 NOTE — Progress Notes (Signed)
Hgb is at 10 today; hold retacrit

## 2022-11-07 DIAGNOSIS — G301 Alzheimer's disease with late onset: Secondary | ICD-10-CM | POA: Diagnosis not present

## 2022-11-13 DIAGNOSIS — N1832 Chronic kidney disease, stage 3b: Secondary | ICD-10-CM | POA: Diagnosis not present

## 2022-11-13 DIAGNOSIS — G301 Alzheimer's disease with late onset: Secondary | ICD-10-CM | POA: Diagnosis not present

## 2022-11-13 DIAGNOSIS — R269 Unspecified abnormalities of gait and mobility: Secondary | ICD-10-CM | POA: Diagnosis not present

## 2022-11-13 DIAGNOSIS — F02C11 Dementia in other diseases classified elsewhere, severe, with agitation: Secondary | ICD-10-CM | POA: Diagnosis not present

## 2022-11-20 DIAGNOSIS — F0390 Unspecified dementia without behavioral disturbance: Secondary | ICD-10-CM | POA: Diagnosis not present

## 2022-11-20 DIAGNOSIS — L989 Disorder of the skin and subcutaneous tissue, unspecified: Secondary | ICD-10-CM | POA: Diagnosis not present

## 2022-11-21 DIAGNOSIS — G301 Alzheimer's disease with late onset: Secondary | ICD-10-CM | POA: Diagnosis not present

## 2022-11-22 DIAGNOSIS — R296 Repeated falls: Secondary | ICD-10-CM | POA: Diagnosis not present

## 2022-11-22 DIAGNOSIS — M6281 Muscle weakness (generalized): Secondary | ICD-10-CM | POA: Diagnosis not present

## 2022-11-25 DIAGNOSIS — M6281 Muscle weakness (generalized): Secondary | ICD-10-CM | POA: Diagnosis not present

## 2022-11-25 DIAGNOSIS — R54 Age-related physical debility: Secondary | ICD-10-CM | POA: Diagnosis not present

## 2022-11-25 DIAGNOSIS — R296 Repeated falls: Secondary | ICD-10-CM | POA: Diagnosis not present

## 2022-11-25 DIAGNOSIS — D45 Polycythemia vera: Secondary | ICD-10-CM | POA: Diagnosis not present

## 2022-11-25 DIAGNOSIS — F0390 Unspecified dementia without behavioral disturbance: Secondary | ICD-10-CM | POA: Diagnosis not present

## 2022-11-25 DIAGNOSIS — Z515 Encounter for palliative care: Secondary | ICD-10-CM | POA: Diagnosis not present

## 2022-11-25 DIAGNOSIS — E640 Sequelae of protein-calorie malnutrition: Secondary | ICD-10-CM | POA: Diagnosis not present

## 2022-11-26 ENCOUNTER — Encounter: Payer: Self-pay | Admitting: Dermatology

## 2022-11-26 ENCOUNTER — Ambulatory Visit: Payer: Medicare Other | Admitting: Dermatology

## 2022-11-26 DIAGNOSIS — L821 Other seborrheic keratosis: Secondary | ICD-10-CM

## 2022-11-26 DIAGNOSIS — D492 Neoplasm of unspecified behavior of bone, soft tissue, and skin: Secondary | ICD-10-CM | POA: Diagnosis not present

## 2022-11-26 DIAGNOSIS — D489 Neoplasm of uncertain behavior, unspecified: Secondary | ICD-10-CM

## 2022-11-26 NOTE — Progress Notes (Signed)
   Follow-Up Visit   Subjective  Laura Wilcox is a 89 y.o. female who presents for the following: spots on right cheek that have been growing over a year. Recently bled and drained after trauma. Had a large skin cancer on left cheek that required radiation. Daughter also reports a growth on left lower leg she would like checked on patient. Patient has dementia and hearing loss.   The patient has spots, moles and lesions to be evaluated, some may be new or changing and the patient may have concern these could be cancer.   The following portions of the chart were reviewed this encounter and updated as appropriate: medications, allergies, medical history  Review of Systems:  No other skin or systemic complaints except as noted in HPI or Assessment and Plan.  Objective  Well appearing patient in no apparent distress; mood and affect are within normal limits.  A focused examination was performed of the following areas: Face, right cheek  Relevant exam findings are noted in the Assessment and Plan.  right cheek       left lower medial leg Cutaneous horn    Assessment & Plan     Neoplasm of skin right cheek  Neoplasm of uncertain behavior left lower medial leg  Sk vs scc  Patient and daughter deferred biopsy today     SEBORRHEIC KERATOSIS At right cheek x several, see photos, lesion on right jaw line bled and drained but is improving - Stuck-on, waxy, tan-brown papules and/or plaques  - Benign-appearing - Discussed benign etiology and prognosis. Possibility of early skin cancer within the lesion - offered shave removal, but would leave a large wound to heal by secondary intention. Daughter shared that this would be difficult for the patient to tolerate. Jointly decided to Observe - Call for any changes   Return for keep appt in october as scheduled .  I, Asher Muir, CMA, am acting as scribe for Elie Goody, MD.    Documentation: I have reviewed the  above documentation for accuracy and completeness, and I agree with the above.  Elie Goody, MD

## 2022-11-26 NOTE — Patient Instructions (Addendum)
If reopens can apply vaseline to affected area at right cheeks until healed.     Seborrheic Keratosis  What causes seborrheic keratoses? Seborrheic keratoses are harmless, common skin growths that first appear during adult life.  As time goes by, more growths appear.  Some people may develop a large number of them.  Seborrheic keratoses appear on both covered and uncovered body parts.  They are not caused by sunlight.  The tendency to develop seborrheic keratoses can be inherited.  They vary in color from skin-colored to gray, brown, or even black.  They can be either smooth or have a rough, warty surface.   Seborrheic keratoses are superficial and look as if they were stuck on the skin.  Under the microscope this type of keratosis looks like layers upon layers of skin.  That is why at times the top layer may seem to fall off, but the rest of the growth remains and re-grows.    Treatment Seborrheic keratoses do not need to be treated, but can easily be removed in the office.  Seborrheic keratoses often cause symptoms when they rub on clothing or jewelry.  Lesions can be in the way of shaving.  If they become inflamed, they can cause itching, soreness, or burning.  Removal of a seborrheic keratosis can be accomplished by freezing, burning, or surgery. If any spot bleeds, scabs, or grows rapidly, please return to have it checked, as these can be an indication of a skin cancer.     Due to recent changes in healthcare laws, you may see results of your pathology and/or laboratory studies on MyChart before the doctors have had a chance to review them. We understand that in some cases there may be results that are confusing or concerning to you. Please understand that not all results are received at the same time and often the doctors may need to interpret multiple results in order to provide you with the best plan of care or course of treatment. Therefore, we ask that you please give [place] 2 business days to  thoroughly review all your results before contacting the office for clarification. Should we see a critical lab result, you will be contacted sooner.   If You Need Anything After Your Visit  If you have any questions or concerns for your doctor, please call our main line at [phone] and press option 4 to reach your doctor's medical assistant. If no one answers, please leave a voicemail as directed and we will return your call as soon as possible. Messages left after 4 pm will be answered the following business day.   You may also send [place] a message via MyChart. We typically respond to MyChart messages within 1-2 business days.  For prescription refills, please ask your pharmacy to contact our office. Our fax number is [phone].  If you have an urgent issue when the clinic is closed that cannot wait until the next business day, you can page your doctor at the number below.    Please note that while we do our best to be available for urgent issues outside of office hours, we are not available 24/7.   If you have an urgent issue and are unable to reach [place], you may choose to seek medical care at your doctor's office, retail clinic, urgent care center, or emergency room.  If you have a medical emergency, please immediately call 911 or go to the emergency department.  Pager Numbers  - Dr. Gwen Pounds: [phone]  - Dr.  Roseanne Reno: [phone]  - Dr. Katrinka Blazing: [phone]   In the event of inclement weather, please call our main line at [phone] for an update on the status of any delays or closures.  Dermatology Medication Tips: Please keep the boxes that topical medications come in in order to help keep track of the instructions about where and how to use these. Pharmacies typically print the medication instructions only on the boxes and not directly on the medication tubes.   If your medication is too expensive, please contact our office at [phone] option 4 or send [place] a message  through MyChart.   We are unable to tell what your co-pay for medications will be in advance as this is different depending on your insurance coverage. However, we may be able to find a substitute medication at lower cost or fill out paperwork to get insurance to cover a needed medication.   If a prior authorization is required to get your medication covered by your insurance company, please allow [place] 1-2 business days to complete this process.  Drug prices often vary depending on where the prescription is filled and some pharmacies may offer cheaper prices.  The website www.goodrx.com contains coupons for medications through different pharmacies. The prices here do not account for what the cost may be with help from insurance (it may be cheaper with your insurance), but the website can give you the price if you did not use any insurance.  - You can print the associated coupon and take it with your prescription to the pharmacy.  - You may also stop by our office during regular business hours and pick up a GoodRx coupon card.  - If you need your prescription sent electronically to a different pharmacy, notify our office through [facility] or by phone at [phone] option 4.     Si Usted Necesita Algo Despus de Su Visita  Tambin puede enviarnos un mensaje a travs de Clinical cytogeneticist. Por lo general respondemos a los mensajes de MyChart en el transcurso de 1 a 2 das hbiles.  Para renovar recetas, por favor pida a su farmacia que se ponga en contacto con nuestra oficina. Annie Sable de fax es [place] [phone].  Si tiene un asunto urgente cuando la clnica est cerrada y que no puede esperar hasta el siguiente da hbil, puede llamar/localizar a su doctor(a) al nmero que aparece a continuacin.   Por favor, tenga en cuenta que aunque hacemos todo lo posible para estar disponibles para asuntos urgentes fuera del horario de [place], no estamos disponibles las 24 horas del da, los 7 [address]  [address] de  la [place].   Si tiene un problema urgente y no puede comunicarse con nosotros, puede optar por buscar atencin mdica  en el consultorio de su doctor(a), en una clnica privada, en un centro de atencin urgente o en una sala de emergencias.  Si tiene Engineer, drilling, por favor llame inmediatamente al 911 o vaya a la sala de emergencias.  Nmeros de bper  - Dr. Gwen Pounds: [phone]  - Dra. Roseanne Reno: [phone]  - Dr. Katrinka Blazing: [phone]   En caso de inclemencias del tiempo, por favor llame a Lacy Duverney principal al [phone] para una actualizacin sobre el [place] de cualquier retraso o cierre.  Consejos para la medicacin en dermatologa: Por favor, guarde las cajas en las que vienen los medicamentos de uso tpico para ayudarle a seguir las instrucciones sobre dnde y cmo usarlos. Las farmacias generalmente imprimen las instrucciones del medicamento slo en las cajas  y no directamente en los tubos del medicamento.   Si su medicamento es muy caro, por favor, pngase en contacto con Rolm Gala llamando al [phone] y presione la opcin 4 o envenos un mensaje a travs de Clinical cytogeneticist.   No podemos decirle cul ser su copago por los medicamentos por adelantado ya que esto es diferente dependiendo de la cobertura de su seguro. Sin embargo, es posible que podamos encontrar un medicamento sustituto a Audiological scientist un formulario para que el seguro cubra el medicamento que se considera necesario.   Si se requiere una autorizacin previa para que su compaa de seguros [place] su medicamento, por favor permtanos de 1 a 2 das hbiles para completar [address]  Los precios de los medicamentos varan con frecuencia dependiendo del Environmental consultant de dnde se surte la receta y alguna farmacias pueden ofrecer precios ms baratos.  El sitio web www.goodrx.com tiene cupones para medicamentos de Health and safety inspector. Los precios aqu no tienen en cuenta lo que podra costar con la ayuda  del seguro (puede ser ms barato con su seguro), pero el sitio web puede darle el precio si no utiliz Tourist information centre manager.  - Puede imprimir el cupn correspondiente y llevarlo con su receta a la farmacia.  - Tambin puede pasar por nuestra oficina durante el horario de atencin regular y Education officer, museum una tarjeta de cupones de GoodRx.  - Si necesita que su receta se enve electrnicamente a una farmacia diferente, informe a nuestra oficina a travs de MyChart de [place] o por telfono llamando al [phone] y presione la opcin 4.

## 2022-11-27 ENCOUNTER — Inpatient Hospital Stay: Payer: Medicare Other | Attending: Oncology

## 2022-11-27 ENCOUNTER — Inpatient Hospital Stay: Payer: Medicare Other

## 2022-11-27 VITALS — BP 126/72

## 2022-11-27 DIAGNOSIS — Z515 Encounter for palliative care: Secondary | ICD-10-CM | POA: Diagnosis not present

## 2022-11-27 DIAGNOSIS — G301 Alzheimer's disease with late onset: Secondary | ICD-10-CM | POA: Diagnosis not present

## 2022-11-27 DIAGNOSIS — D45 Polycythemia vera: Secondary | ICD-10-CM | POA: Insufficient documentation

## 2022-11-27 DIAGNOSIS — R269 Unspecified abnormalities of gait and mobility: Secondary | ICD-10-CM | POA: Diagnosis not present

## 2022-11-27 DIAGNOSIS — F02C11 Dementia in other diseases classified elsewhere, severe, with agitation: Secondary | ICD-10-CM | POA: Diagnosis not present

## 2022-11-27 DIAGNOSIS — N1832 Chronic kidney disease, stage 3b: Secondary | ICD-10-CM | POA: Diagnosis not present

## 2022-11-27 DIAGNOSIS — F339 Major depressive disorder, recurrent, unspecified: Secondary | ICD-10-CM | POA: Diagnosis not present

## 2022-11-27 DIAGNOSIS — I739 Peripheral vascular disease, unspecified: Secondary | ICD-10-CM | POA: Diagnosis not present

## 2022-11-27 DIAGNOSIS — D531 Other megaloblastic anemias, not elsewhere classified: Secondary | ICD-10-CM | POA: Diagnosis not present

## 2022-11-27 DIAGNOSIS — E46 Unspecified protein-calorie malnutrition: Secondary | ICD-10-CM | POA: Diagnosis not present

## 2022-11-27 LAB — CBC WITH DIFFERENTIAL (CANCER CENTER ONLY)
Abs Immature Granulocytes: 0.75 K/uL — ABNORMAL HIGH (ref 0.00–0.07)
Basophils Absolute: 0.2 K/uL — ABNORMAL HIGH (ref 0.0–0.1)
Basophils Relative: 2 %
Eosinophils Absolute: 0.7 K/uL — ABNORMAL HIGH (ref 0.0–0.5)
Eosinophils Relative: 5 %
HCT: 34.2 % — ABNORMAL LOW (ref 36.0–46.0)
Hemoglobin: 10.6 g/dL — ABNORMAL LOW (ref 12.0–15.0)
Immature Granulocytes: 6 %
Lymphocytes Relative: 16 %
Lymphs Abs: 2.1 K/uL (ref 0.7–4.0)
MCH: 29 pg (ref 26.0–34.0)
MCHC: 31 g/dL (ref 30.0–36.0)
MCV: 93.4 fL (ref 80.0–100.0)
Monocytes Absolute: 1.3 K/uL — ABNORMAL HIGH (ref 0.1–1.0)
Monocytes Relative: 10 %
Neutro Abs: 8.2 K/uL — ABNORMAL HIGH (ref 1.7–7.7)
Neutrophils Relative %: 62 %
Platelet Count: 742 K/uL — ABNORMAL HIGH (ref 150–400)
RBC: 3.66 MIL/uL — ABNORMAL LOW (ref 3.87–5.11)
RDW: 21.2 % — ABNORMAL HIGH (ref 11.5–15.5)
Smear Review: INCREASED
WBC Count: 13.3 K/uL — ABNORMAL HIGH (ref 4.0–10.5)
nRBC: 0 % (ref 0.0–0.2)

## 2022-11-27 MED ORDER — EPOETIN ALFA-EPBX 20000 UNIT/ML IJ SOLN: 20000.0000 [IU] | Freq: Once | INTRAMUSCULAR | Status: DC

## 2022-12-04 DIAGNOSIS — R269 Unspecified abnormalities of gait and mobility: Secondary | ICD-10-CM | POA: Diagnosis not present

## 2022-12-04 DIAGNOSIS — F02C11 Dementia in other diseases classified elsewhere, severe, with agitation: Secondary | ICD-10-CM | POA: Diagnosis not present

## 2022-12-04 DIAGNOSIS — D7581 Myelofibrosis: Secondary | ICD-10-CM | POA: Diagnosis not present

## 2022-12-04 DIAGNOSIS — I739 Peripheral vascular disease, unspecified: Secondary | ICD-10-CM | POA: Diagnosis not present

## 2022-12-04 DIAGNOSIS — D45 Polycythemia vera: Secondary | ICD-10-CM | POA: Diagnosis not present

## 2022-12-04 DIAGNOSIS — R030 Elevated blood-pressure reading, without diagnosis of hypertension: Secondary | ICD-10-CM | POA: Diagnosis not present

## 2022-12-04 DIAGNOSIS — N1832 Chronic kidney disease, stage 3b: Secondary | ICD-10-CM | POA: Diagnosis not present

## 2022-12-04 DIAGNOSIS — E46 Unspecified protein-calorie malnutrition: Secondary | ICD-10-CM | POA: Diagnosis not present

## 2022-12-04 DIAGNOSIS — G301 Alzheimer's disease with late onset: Secondary | ICD-10-CM | POA: Diagnosis not present

## 2022-12-05 ENCOUNTER — Other Ambulatory Visit (HOSPITAL_COMMUNITY): Payer: Self-pay

## 2022-12-05 DIAGNOSIS — D631 Anemia in chronic kidney disease: Secondary | ICD-10-CM | POA: Diagnosis not present

## 2022-12-05 DIAGNOSIS — N1832 Chronic kidney disease, stage 3b: Secondary | ICD-10-CM | POA: Diagnosis not present

## 2022-12-05 DIAGNOSIS — E875 Hyperkalemia: Secondary | ICD-10-CM | POA: Diagnosis not present

## 2022-12-05 DIAGNOSIS — N2581 Secondary hyperparathyroidism of renal origin: Secondary | ICD-10-CM | POA: Diagnosis not present

## 2022-12-05 DIAGNOSIS — G301 Alzheimer's disease with late onset: Secondary | ICD-10-CM | POA: Diagnosis not present

## 2022-12-09 DIAGNOSIS — E559 Vitamin D deficiency, unspecified: Secondary | ICD-10-CM | POA: Diagnosis not present

## 2022-12-09 DIAGNOSIS — Z79899 Other long term (current) drug therapy: Secondary | ICD-10-CM | POA: Diagnosis not present

## 2022-12-10 DIAGNOSIS — F0390 Unspecified dementia without behavioral disturbance: Secondary | ICD-10-CM | POA: Diagnosis not present

## 2022-12-10 DIAGNOSIS — Z515 Encounter for palliative care: Secondary | ICD-10-CM | POA: Diagnosis not present

## 2022-12-10 DIAGNOSIS — D45 Polycythemia vera: Secondary | ICD-10-CM | POA: Diagnosis not present

## 2022-12-10 DIAGNOSIS — R54 Age-related physical debility: Secondary | ICD-10-CM | POA: Diagnosis not present

## 2022-12-10 DIAGNOSIS — E640 Sequelae of protein-calorie malnutrition: Secondary | ICD-10-CM | POA: Diagnosis not present

## 2022-12-12 DIAGNOSIS — E875 Hyperkalemia: Secondary | ICD-10-CM | POA: Diagnosis not present

## 2022-12-12 DIAGNOSIS — E039 Hypothyroidism, unspecified: Secondary | ICD-10-CM | POA: Diagnosis not present

## 2022-12-12 DIAGNOSIS — E46 Unspecified protein-calorie malnutrition: Secondary | ICD-10-CM | POA: Diagnosis not present

## 2022-12-12 DIAGNOSIS — F02C11 Dementia in other diseases classified elsewhere, severe, with agitation: Secondary | ICD-10-CM | POA: Diagnosis not present

## 2022-12-12 DIAGNOSIS — D7581 Myelofibrosis: Secondary | ICD-10-CM | POA: Diagnosis not present

## 2022-12-12 DIAGNOSIS — D45 Polycythemia vera: Secondary | ICD-10-CM | POA: Diagnosis not present

## 2022-12-12 DIAGNOSIS — E782 Mixed hyperlipidemia: Secondary | ICD-10-CM | POA: Diagnosis not present

## 2022-12-12 DIAGNOSIS — R030 Elevated blood-pressure reading, without diagnosis of hypertension: Secondary | ICD-10-CM | POA: Diagnosis not present

## 2022-12-12 DIAGNOSIS — C4492 Squamous cell carcinoma of skin, unspecified: Secondary | ICD-10-CM | POA: Diagnosis not present

## 2022-12-12 DIAGNOSIS — N1832 Chronic kidney disease, stage 3b: Secondary | ICD-10-CM | POA: Diagnosis not present

## 2022-12-12 DIAGNOSIS — G301 Alzheimer's disease with late onset: Secondary | ICD-10-CM | POA: Diagnosis not present

## 2022-12-16 DIAGNOSIS — I10 Essential (primary) hypertension: Secondary | ICD-10-CM | POA: Diagnosis not present

## 2022-12-17 DIAGNOSIS — E46 Unspecified protein-calorie malnutrition: Secondary | ICD-10-CM | POA: Diagnosis not present

## 2022-12-17 DIAGNOSIS — C44320 Squamous cell carcinoma of skin of unspecified parts of face: Secondary | ICD-10-CM | POA: Diagnosis not present

## 2022-12-17 DIAGNOSIS — Z4800 Encounter for change or removal of nonsurgical wound dressing: Secondary | ICD-10-CM | POA: Diagnosis not present

## 2022-12-17 DIAGNOSIS — Z9181 History of falling: Secondary | ICD-10-CM | POA: Diagnosis not present

## 2022-12-17 DIAGNOSIS — E875 Hyperkalemia: Secondary | ICD-10-CM | POA: Diagnosis not present

## 2022-12-17 DIAGNOSIS — D630 Anemia in neoplastic disease: Secondary | ICD-10-CM | POA: Diagnosis not present

## 2022-12-17 DIAGNOSIS — R41841 Cognitive communication deficit: Secondary | ICD-10-CM | POA: Diagnosis not present

## 2022-12-17 DIAGNOSIS — G301 Alzheimer's disease with late onset: Secondary | ICD-10-CM | POA: Diagnosis not present

## 2022-12-17 DIAGNOSIS — E782 Mixed hyperlipidemia: Secondary | ICD-10-CM | POA: Diagnosis not present

## 2022-12-17 DIAGNOSIS — Z681 Body mass index (BMI) 19 or less, adult: Secondary | ICD-10-CM | POA: Diagnosis not present

## 2022-12-17 DIAGNOSIS — N390 Urinary tract infection, site not specified: Secondary | ICD-10-CM | POA: Diagnosis not present

## 2022-12-17 DIAGNOSIS — D45 Polycythemia vera: Secondary | ICD-10-CM | POA: Diagnosis not present

## 2022-12-17 DIAGNOSIS — Z7982 Long term (current) use of aspirin: Secondary | ICD-10-CM | POA: Diagnosis not present

## 2022-12-17 DIAGNOSIS — F02C3 Dementia in other diseases classified elsewhere, severe, with mood disturbance: Secondary | ICD-10-CM | POA: Diagnosis not present

## 2022-12-17 DIAGNOSIS — E039 Hypothyroidism, unspecified: Secondary | ICD-10-CM | POA: Diagnosis not present

## 2022-12-17 DIAGNOSIS — Z85820 Personal history of malignant melanoma of skin: Secondary | ICD-10-CM | POA: Diagnosis not present

## 2022-12-17 DIAGNOSIS — D7581 Myelofibrosis: Secondary | ICD-10-CM | POA: Diagnosis not present

## 2022-12-17 DIAGNOSIS — F32A Depression, unspecified: Secondary | ICD-10-CM | POA: Diagnosis not present

## 2022-12-17 DIAGNOSIS — N1832 Chronic kidney disease, stage 3b: Secondary | ICD-10-CM | POA: Diagnosis not present

## 2022-12-19 DIAGNOSIS — G301 Alzheimer's disease with late onset: Secondary | ICD-10-CM | POA: Diagnosis not present

## 2022-12-20 DIAGNOSIS — N1832 Chronic kidney disease, stage 3b: Secondary | ICD-10-CM | POA: Diagnosis not present

## 2022-12-20 DIAGNOSIS — Z4800 Encounter for change or removal of nonsurgical wound dressing: Secondary | ICD-10-CM | POA: Diagnosis not present

## 2022-12-20 DIAGNOSIS — G301 Alzheimer's disease with late onset: Secondary | ICD-10-CM | POA: Diagnosis not present

## 2022-12-20 DIAGNOSIS — C44320 Squamous cell carcinoma of skin of unspecified parts of face: Secondary | ICD-10-CM | POA: Diagnosis not present

## 2022-12-20 DIAGNOSIS — F02C3 Dementia in other diseases classified elsewhere, severe, with mood disturbance: Secondary | ICD-10-CM | POA: Diagnosis not present

## 2022-12-20 DIAGNOSIS — F32A Depression, unspecified: Secondary | ICD-10-CM | POA: Diagnosis not present

## 2022-12-24 ENCOUNTER — Other Ambulatory Visit: Payer: PRIVATE HEALTH INSURANCE

## 2022-12-24 DIAGNOSIS — F02C3 Dementia in other diseases classified elsewhere, severe, with mood disturbance: Secondary | ICD-10-CM | POA: Diagnosis not present

## 2022-12-24 DIAGNOSIS — C44320 Squamous cell carcinoma of skin of unspecified parts of face: Secondary | ICD-10-CM | POA: Diagnosis not present

## 2022-12-24 DIAGNOSIS — F32A Depression, unspecified: Secondary | ICD-10-CM | POA: Diagnosis not present

## 2022-12-24 DIAGNOSIS — Z4800 Encounter for change or removal of nonsurgical wound dressing: Secondary | ICD-10-CM | POA: Diagnosis not present

## 2022-12-24 DIAGNOSIS — N1832 Chronic kidney disease, stage 3b: Secondary | ICD-10-CM | POA: Diagnosis not present

## 2022-12-24 DIAGNOSIS — G301 Alzheimer's disease with late onset: Secondary | ICD-10-CM | POA: Diagnosis not present

## 2022-12-25 DIAGNOSIS — F331 Major depressive disorder, recurrent, moderate: Secondary | ICD-10-CM | POA: Diagnosis not present

## 2022-12-25 DIAGNOSIS — F339 Major depressive disorder, recurrent, unspecified: Secondary | ICD-10-CM | POA: Diagnosis not present

## 2022-12-25 DIAGNOSIS — D531 Other megaloblastic anemias, not elsewhere classified: Secondary | ICD-10-CM | POA: Diagnosis not present

## 2022-12-25 DIAGNOSIS — D45 Polycythemia vera: Secondary | ICD-10-CM | POA: Diagnosis not present

## 2022-12-25 DIAGNOSIS — E46 Unspecified protein-calorie malnutrition: Secondary | ICD-10-CM | POA: Diagnosis not present

## 2022-12-25 DIAGNOSIS — N390 Urinary tract infection, site not specified: Secondary | ICD-10-CM | POA: Diagnosis not present

## 2022-12-25 DIAGNOSIS — D638 Anemia in other chronic diseases classified elsewhere: Secondary | ICD-10-CM | POA: Diagnosis not present

## 2022-12-25 DIAGNOSIS — M6281 Muscle weakness (generalized): Secondary | ICD-10-CM | POA: Diagnosis not present

## 2022-12-25 DIAGNOSIS — G301 Alzheimer's disease with late onset: Secondary | ICD-10-CM | POA: Diagnosis not present

## 2022-12-25 DIAGNOSIS — F02C11 Dementia in other diseases classified elsewhere, severe, with agitation: Secondary | ICD-10-CM | POA: Diagnosis not present

## 2022-12-26 ENCOUNTER — Ambulatory Visit: Payer: PRIVATE HEALTH INSURANCE

## 2022-12-26 ENCOUNTER — Ambulatory Visit: Payer: PRIVATE HEALTH INSURANCE | Admitting: Oncology

## 2022-12-27 DIAGNOSIS — F32A Depression, unspecified: Secondary | ICD-10-CM | POA: Diagnosis not present

## 2022-12-27 DIAGNOSIS — G301 Alzheimer's disease with late onset: Secondary | ICD-10-CM | POA: Diagnosis not present

## 2022-12-27 DIAGNOSIS — Z4800 Encounter for change or removal of nonsurgical wound dressing: Secondary | ICD-10-CM | POA: Diagnosis not present

## 2022-12-27 DIAGNOSIS — N1832 Chronic kidney disease, stage 3b: Secondary | ICD-10-CM | POA: Diagnosis not present

## 2022-12-27 DIAGNOSIS — C44320 Squamous cell carcinoma of skin of unspecified parts of face: Secondary | ICD-10-CM | POA: Diagnosis not present

## 2022-12-27 DIAGNOSIS — F02C3 Dementia in other diseases classified elsewhere, severe, with mood disturbance: Secondary | ICD-10-CM | POA: Diagnosis not present

## 2022-12-30 ENCOUNTER — Inpatient Hospital Stay: Payer: Medicare Other | Attending: Oncology

## 2022-12-30 DIAGNOSIS — L600 Ingrowing nail: Secondary | ICD-10-CM | POA: Diagnosis not present

## 2022-12-30 DIAGNOSIS — R2689 Other abnormalities of gait and mobility: Secondary | ICD-10-CM | POA: Diagnosis not present

## 2022-12-30 DIAGNOSIS — I872 Venous insufficiency (chronic) (peripheral): Secondary | ICD-10-CM | POA: Diagnosis not present

## 2022-12-30 DIAGNOSIS — F03911 Unspecified dementia, unspecified severity, with agitation: Secondary | ICD-10-CM | POA: Diagnosis not present

## 2022-12-30 DIAGNOSIS — Z79899 Other long term (current) drug therapy: Secondary | ICD-10-CM | POA: Insufficient documentation

## 2022-12-30 DIAGNOSIS — D45 Polycythemia vera: Secondary | ICD-10-CM | POA: Diagnosis not present

## 2022-12-30 DIAGNOSIS — N1832 Chronic kidney disease, stage 3b: Secondary | ICD-10-CM | POA: Diagnosis not present

## 2022-12-30 DIAGNOSIS — R262 Difficulty in walking, not elsewhere classified: Secondary | ICD-10-CM | POA: Diagnosis not present

## 2022-12-30 DIAGNOSIS — M722 Plantar fascial fibromatosis: Secondary | ICD-10-CM | POA: Diagnosis not present

## 2022-12-30 DIAGNOSIS — F32A Depression, unspecified: Secondary | ICD-10-CM | POA: Diagnosis not present

## 2022-12-30 DIAGNOSIS — M6281 Muscle weakness (generalized): Secondary | ICD-10-CM | POA: Diagnosis not present

## 2022-12-30 DIAGNOSIS — L989 Disorder of the skin and subcutaneous tissue, unspecified: Secondary | ICD-10-CM | POA: Diagnosis not present

## 2022-12-30 DIAGNOSIS — R238 Other skin changes: Secondary | ICD-10-CM | POA: Diagnosis not present

## 2022-12-30 DIAGNOSIS — B351 Tinea unguium: Secondary | ICD-10-CM | POA: Diagnosis not present

## 2022-12-30 LAB — CMP (CANCER CENTER ONLY)
ALT: 12 U/L (ref 0–44)
AST: 21 U/L (ref 15–41)
Albumin: 3.8 g/dL (ref 3.5–5.0)
Alkaline Phosphatase: 105 U/L (ref 38–126)
Anion gap: 10 (ref 5–15)
BUN: 30 mg/dL — ABNORMAL HIGH (ref 8–23)
CO2: 26 mmol/L (ref 22–32)
Calcium: 9.7 mg/dL (ref 8.9–10.3)
Chloride: 100 mmol/L (ref 98–111)
Creatinine: 1.41 mg/dL — ABNORMAL HIGH (ref 0.44–1.00)
GFR, Estimated: 35 mL/min — ABNORMAL LOW (ref >60)
Glucose, Bld: 104 mg/dL — ABNORMAL HIGH (ref 70–99)
Potassium: 4.5 mmol/L (ref 3.5–5.1)
Sodium: 136 mmol/L (ref 135–145)
Total Bilirubin: 0.3 mg/dL (ref 0.3–1.2)
Total Protein: 7.6 g/dL (ref 6.5–8.1)

## 2022-12-30 LAB — CBC WITH DIFFERENTIAL (CANCER CENTER ONLY)
Abs Immature Granulocytes: 0.89 10*3/uL — ABNORMAL HIGH (ref 0.00–0.07)
Basophils Absolute: 0.3 10*3/uL — ABNORMAL HIGH (ref 0.0–0.1)
Basophils Relative: 3 %
Eosinophils Absolute: 0.7 10*3/uL — ABNORMAL HIGH (ref 0.0–0.5)
Eosinophils Relative: 6 %
HCT: 34.6 % — ABNORMAL LOW (ref 36.0–46.0)
Hemoglobin: 10.5 g/dL — ABNORMAL LOW (ref 12.0–15.0)
Immature Granulocytes: 7 %
Lymphocytes Relative: 15 %
Lymphs Abs: 1.9 10*3/uL (ref 0.7–4.0)
MCH: 27.4 pg (ref 26.0–34.0)
MCHC: 30.3 g/dL (ref 30.0–36.0)
MCV: 90.3 fL (ref 80.0–100.0)
Monocytes Absolute: 1.3 10*3/uL — ABNORMAL HIGH (ref 0.1–1.0)
Monocytes Relative: 10 %
Neutro Abs: 7.5 10*3/uL (ref 1.7–7.7)
Neutrophils Relative %: 59 %
Platelet Count: 843 10*3/uL — ABNORMAL HIGH (ref 150–400)
RBC: 3.83 MIL/uL — ABNORMAL LOW (ref 3.87–5.11)
RDW: 20.7 % — ABNORMAL HIGH (ref 11.5–15.5)
WBC Count: 12.7 10*3/uL — ABNORMAL HIGH (ref 4.0–10.5)
nRBC: 0 % (ref 0.0–0.2)

## 2022-12-30 LAB — IRON AND TIBC
Iron: 50 ug/dL (ref 28–170)
Saturation Ratios: 16 % (ref 10.4–31.8)
TIBC: 318 ug/dL (ref 250–450)
UIBC: 268 ug/dL

## 2022-12-30 LAB — FERRITIN: Ferritin: 174 ng/mL (ref 11–307)

## 2022-12-31 ENCOUNTER — Encounter: Payer: Self-pay | Admitting: Oncology

## 2022-12-31 ENCOUNTER — Other Ambulatory Visit: Payer: Self-pay

## 2022-12-31 ENCOUNTER — Telehealth: Payer: Self-pay

## 2022-12-31 ENCOUNTER — Other Ambulatory Visit (HOSPITAL_COMMUNITY): Payer: Self-pay

## 2022-12-31 ENCOUNTER — Inpatient Hospital Stay: Payer: Medicare Other | Attending: Oncology | Admitting: Oncology

## 2022-12-31 ENCOUNTER — Inpatient Hospital Stay: Payer: Medicare Other

## 2022-12-31 VITALS — BP 116/66 | HR 89 | Temp 97.7°F | Resp 18 | Wt 121.3 lb

## 2022-12-31 DIAGNOSIS — N1832 Chronic kidney disease, stage 3b: Secondary | ICD-10-CM

## 2022-12-31 DIAGNOSIS — Z85828 Personal history of other malignant neoplasm of skin: Secondary | ICD-10-CM | POA: Insufficient documentation

## 2022-12-31 DIAGNOSIS — Z803 Family history of malignant neoplasm of breast: Secondary | ICD-10-CM | POA: Diagnosis not present

## 2022-12-31 DIAGNOSIS — Z881 Allergy status to other antibiotic agents status: Secondary | ICD-10-CM | POA: Diagnosis not present

## 2022-12-31 DIAGNOSIS — D45 Polycythemia vera: Secondary | ICD-10-CM | POA: Diagnosis not present

## 2022-12-31 DIAGNOSIS — R7881 Bacteremia: Secondary | ICD-10-CM | POA: Insufficient documentation

## 2022-12-31 DIAGNOSIS — F32A Depression, unspecified: Secondary | ICD-10-CM | POA: Diagnosis not present

## 2022-12-31 DIAGNOSIS — Z818 Family history of other mental and behavioral disorders: Secondary | ICD-10-CM | POA: Insufficient documentation

## 2022-12-31 DIAGNOSIS — D631 Anemia in chronic kidney disease: Secondary | ICD-10-CM | POA: Insufficient documentation

## 2022-12-31 DIAGNOSIS — F02C3 Dementia in other diseases classified elsewhere, severe, with mood disturbance: Secondary | ICD-10-CM | POA: Diagnosis not present

## 2022-12-31 DIAGNOSIS — D7581 Myelofibrosis: Secondary | ICD-10-CM | POA: Diagnosis not present

## 2022-12-31 DIAGNOSIS — Z90710 Acquired absence of both cervix and uterus: Secondary | ICD-10-CM | POA: Diagnosis not present

## 2022-12-31 DIAGNOSIS — Z87440 Personal history of urinary (tract) infections: Secondary | ICD-10-CM | POA: Insufficient documentation

## 2022-12-31 DIAGNOSIS — Z8249 Family history of ischemic heart disease and other diseases of the circulatory system: Secondary | ICD-10-CM | POA: Insufficient documentation

## 2022-12-31 DIAGNOSIS — Z4800 Encounter for change or removal of nonsurgical wound dressing: Secondary | ICD-10-CM | POA: Diagnosis not present

## 2022-12-31 DIAGNOSIS — F0390 Unspecified dementia without behavioral disturbance: Secondary | ICD-10-CM | POA: Diagnosis not present

## 2022-12-31 DIAGNOSIS — N189 Chronic kidney disease, unspecified: Secondary | ICD-10-CM | POA: Insufficient documentation

## 2022-12-31 DIAGNOSIS — C44320 Squamous cell carcinoma of skin of unspecified parts of face: Secondary | ICD-10-CM | POA: Diagnosis not present

## 2022-12-31 DIAGNOSIS — Z79899 Other long term (current) drug therapy: Secondary | ICD-10-CM | POA: Diagnosis not present

## 2022-12-31 DIAGNOSIS — R5383 Other fatigue: Secondary | ICD-10-CM | POA: Diagnosis not present

## 2022-12-31 DIAGNOSIS — G301 Alzheimer's disease with late onset: Secondary | ICD-10-CM | POA: Diagnosis not present

## 2022-12-31 MED ORDER — IRON-VITAMIN C 65-125 MG PO TABS: 1.0000 | ORAL_TABLET | Freq: Every day | ORAL | 0 refills | Status: DC

## 2022-12-31 MED ORDER — RUXOLITINIB PHOSPHATE 5 MG PO TABS
5.0000 mg | ORAL_TABLET | ORAL | 1 refills | Status: DC
Start: 2022-12-31 — End: 2023-02-24
  Filled 2022-12-31 (×2): qty 90, 30d supply, fill #0
  Filled 2023-01-20: qty 90, 30d supply, fill #1

## 2022-12-31 NOTE — Assessment & Plan Note (Addendum)
Polycythemia vera and  secondary myelofibrosis. Labs are reviewed and discussed with patient. Platelet count has increased Increase Jakafi 10 mg in the morning, 5 mg in the evening.   Continue aspirin 81 mg daily.

## 2022-12-31 NOTE — Progress Notes (Signed)
Hematology/Oncology Progress note [phone] [phone]     ASSESSMENT & PLAN:   Myelofibrosis (HCC) Polycythemia vera and  secondary myelofibrosis. Labs are reviewed and discussed with patient. Platelet count has increased Increase Jakafi 10 mg in the morning, 5 mg in the evening.   Continue aspirin 81 mg daily.  Anemia in chronic kidney disease Anemia due to CKD, and myelofibrosis.   Monthly Retacrit 20,000 units, hold if Hb >=10  Orders Placed This Encounter  Procedures   CBC with Differential (Cancer Center Only)    Standing Status:   Future    Standing Expiration Date:   12/31/2023   CMP (Cancer Center only)    Standing Status:   Future    Standing Expiration Date:   12/31/2023   Iron and TIBC    Standing Status:   Future    Standing Expiration Date:   12/31/2023   Ferritin    Standing Status:   Future    Standing Expiration Date:   12/31/2023   Retic Panel    Standing Status:   Future    Standing Expiration Date:   12/31/2023   CBC with Differential (Cancer Center Only)    Standing Status:   Future    Standing Expiration Date:   12/31/2023   CBC with Differential (Cancer Center Only)    Standing Status:   Future    Standing Expiration Date:   12/31/2023   Follow up  Labs in 4 weeks, 8 weeks -cbc +/- retacrit  Follow up in 12 weeks, lab prior to MD +/- retacrit.   All questions were answered. The patient knows to call the clinic with any problems, questions or concerns.  Laura Patience, MD, PhD [facility] Hematology Oncology 12/31/2022      Chief Complaint: Laura Wilcox is a 89 y.o. female with polycythemia vera (PV) and secondary myelofibrosis presents for follow up   PERTINENT ONCOLOGY HISTORY Laura Wilcox is a 89 y.o.afemale who has above oncology history reviewed by me today presented for follow up visit for management of  polycythemia vera  Patient previously followed up by Dr.Corcoran, patient switched care to me on  11/16/20 Extensive medical record review was performed by me  Feb 2014 polycythemia vera and secondary myelofibrosis.  She presented in 04/2002 with thrombocytosis (835,000).  JAK2 V617F was positive   Bone marrow was on 04/15/2002 revealed a normocellular marrow for age with trilineage hematopoiesis and mild megakaryocytic hyperplasia with focal atypia.  There was no morphologic evidence of a myeloproliferative disorder.  Iron stains were inadequate for evaluation of storage iron.  There was no increase in reticulin fibers.  She was started on Agrylin in 2012  Bone marrow on 09/25/2012 revealed persistent myeloproliferative disorder s/p Agrylin.  The current features were best classified as persistent myeloproliferative neoplasm (MPN) JAK2 V617F mutation positive.  The current features of markedly increased atypical megakaryocytes paresis, with frequent clustering with frequent hyper/deeply lobulated forms, bizarre forms and forms with staghorn nuclei, hyperchromatic forms and abundant cytoplasm was highly suggestive of a myeloproliferative neoplasm.  Marrow was hypercellular for age (30-50%) with increased trilineage hematopoiesis and markedly increased atypical megakaryopoiesis with frequent clustering.  There were no increased blasts.  Iron stores were absent.  There was moderate to severe myelofibrosis (grade MF 2-3).  Differential includes ET, PV, primary myelofibrosis, and MPN, unclassifiable.  Cytogenetics were normal (46, XX). She developed pancytopenia in 12/2015.  Etiology is unclear (medication confusion or hydroxyurea + Agrylin).   She received Procrit 40,000 units (  intermittently 09/2012 - 06/2016), Venofer (09/2012 - 10/2012), and Infed (12/2015 - 03/2016).  She received Neupogen during a period of neutropenia.   She was anagrelide from 2004 - 08/13/2017.  She began [place] on 08/14/2017.  Jakafi was increased from 5 mg BID to 10 mg BID on 06/17/2018.  Jakafi was increased to 15 mg BID on  08/03/2018.  She has been back on Jakafi 5 mg BID since 02/01/2019 then 10 mg BID on 05/25/2019.  Earvin Hansen was on hold from 12/20/2019 - 01/12/2020.  Jakafi 5 mg twice daily restarted on 01/12/2020 then increased to 10 mg in the morning and 5 mg in the evening on 02/02/2020. Earvin Hansen is currently 20 mg po BID.  She has received Retacrit to maintain a hemoglobin > 10 (12/24/2019 and 03   Squamous cell carcinoma left cheek- s/p cryotherapy and debridement. Managed by Dr. Gwen Pounds  10/22/2020- 10/26/2020 Hospitalization due to acute metabolic encephalopathy, UTI,E. coli bacteremia AKI on CKD, acute on chronic anemia. Earvin Hansen was held during the admission. She was discharged and spent a short period of time in rehab. Al[place] was held.   11/06/2020 seen by symptom management for weakness.  11/16/2020 established care with me. Resumed Jakafi at 10mg  BID  #Dementia/cognitive impairment- patient has hx of dementia. Per daughter, it has been difficult to convince patent to see neurology for further evaluation. She is not on any medication for her memory loss.  She has been referred to home health.  06/25/2021 - 07/02/2021,Patient was hospitalized due to UTI, generalized weakness Jakafi was.  Held. 07/25/2021, resumed on Jakafi 5 mg daily.  07/25/2021, bilateral lower extremity ultrasound were negative for DVT. 08/20/2021, increased Jakafi to 5 mg twice daily. INTERVAL HISTORY Laura Wilcox is a 89 y.o. female who has above history reviewed by me today presents for follow up visit for management of polycythemia vera and secondary myelofibrosis.   Patient was accompanied by daughter Laura Wilcox  + fatigue  No new complaints.    Past Medical History:  Diagnosis Date   Acute metabolic encephalopathy 10/22/2020   Confusion 12/30/2018   Polycythemia    SCC (squamous cell carcinoma) 04/11/2021   R med dorsum hand - ED&C SCCIS   SCC (squamous cell carcinoma) 10/09/2021   left lateral eye, EDC   SCC (squamous cell  carcinoma) 01/28/2022   left lateral eye, recurrent, EDC. 5FU/Calcipotriene cream.   SCC (squamous cell carcinoma) 01/28/2022   in situ, left medial cheek, 5FU/Calcipotriene Cr   SCC (squamous cell carcinoma) 02/26/2022   SCC IS, Right Ulnar Dorsal Hand, 5FU/Calcipotriene cream   SCC (squamous cell carcinoma) 09/03/2022   R ankle, EDC   Squamous cell carcinoma of skin 09/06/2020   R thumb - ED&C   Urinary tract infection without hematuria 01/02/2020   Urticaria 06/02/2017    Past Surgical History:  Procedure Laterality Date   ABDOMINAL HYSTERECTOMY     complete   BREAST SURGERY      Family History  Problem Relation Age of Onset   Heart disease Father    Cancer Sister        breast   Cancer Sister        breast   Alzheimer's disease Sister     Social History:  reports that she has never smoked. She has never used smokeless tobacco. She reports that she does not drink alcohol and does not use drugs.   Allergies:  Allergies  Allergen Reactions   Bactrim [Sulfamethoxazole-Trimethoprim]     Due to kidney disease  Epinephrine Other (See Comments)    Current Medications: Current Outpatient Medications  Medication Sig Dispense Refill   acetaminophen (TYLENOL) 325 MG tablet Take 325-650 mg by mouth every 6 (six) hours as needed for mild pain or moderate pain.     aspirin EC 81 MG tablet Take 81 mg by mouth daily. Swallow whole.     calcitRIOL (ROCALTROL) 0.25 MCG capsule Take 0.25 mcg by mouth every Monday, Wednesday, and Friday.     citalopram (CELEXA) 20 MG tablet Take 1 tablet (20 mg total) by mouth daily. 30 tablet 3   feeding supplement (ENSURE ENLIVE / ENSURE PLUS) LIQD Take 237 mLs by mouth 2 (two) times daily between meals. 237 mL 12   Iron-Vitamin C 65-125 MG TABS Take 1 tablet by mouth daily. 60 tablet 0   mupirocin ointment (BACTROBAN) 2 % Apply 1 Application topically daily. Qd to wound on right ankle until healed 22 g 1   polyethylene glycol (MIRALAX /  GLYCOLAX) 17 g packet Take 17 g by mouth daily as needed for mild constipation. 14 each 0   potassium chloride SA (KLOR-CON M) 20 MEQ tablet Take 1 tablet (20 mEq total) by mouth 2 (two) times daily. 14 tablet 0   vitamin B-12 (CYANOCOBALAMIN) 1000 MCG tablet TAKE 1 TABLET BY MOUTH DAILY 30 tablet 1   ruxolitinib phosphate (JAKAFI) 5 MG tablet Take 1 tablet (5 mg total) by mouth See admin instructions. Jakafi 10 mg in the morning, 5 mg in the evening. 90 tablet 1   VELTASSA 16.8 g PACK Take by mouth. (Patient not taking: Reported on 12/31/2022)     No current facility-administered medications for this visit.    Review of Systems  Unable to perform ROS: Dementia    Performance status (ECOG):  2  Vitals Blood pressure 116/66, pulse 89, temperature 97.7 F (36.5 C), resp. rate 18, weight 121 lb 4.8 oz (55 kg).   Physical Exam Vitals reviewed.  Constitutional:      General: She is not in acute distress.    Appearance: She is well-developed.     Comments: Frail appearance, patient sits in the wheelchair  Eyes:     General: No scleral icterus.    Conjunctiva/sclera: Conjunctivae normal.  Cardiovascular:     Rate and Rhythm: Normal rate.  Pulmonary:     Effort: Pulmonary effort is normal. No respiratory distress.     Breath sounds: No wheezing.  Abdominal:     General: There is no distension.     Palpations: Abdomen is soft.     Tenderness: There is no abdominal tenderness.  Musculoskeletal:        General: No deformity. Normal range of motion.  Skin:    General: Skin is warm and dry.     Coloration: Skin is not pale.     Comments: Right cheek skin lesion  Neurological:     Mental Status: She is alert. Mental status is at baseline.  Psychiatric:        Attention and Perception: Attention normal.        Mood and Affect: Mood and affect normal.        Behavior: Behavior is cooperative.        Cognition and Memory: Cognition is impaired. Memory is impaired.    Labs     Latest Ref Rng & Units 12/30/2022   11:19 AM 11/27/2022    3:30 PM 10/30/2022    3:28 PM  CBC  WBC 4.0 - 10.5 K/uL  12.7  13.3  12.4   Hemoglobin 12.0 - 15.0 g/dL 40.1  02.7  25.3   Hematocrit 36.0 - 46.0 % 34.6  34.2  32.9   Platelets 150 - 400 K/uL 843  742  623       Latest Ref Rng & Units 12/30/2022   11:18 AM 10/02/2022    1:14 PM 07/10/2022    1:08 PM  CMP  Glucose 70 - 99 mg/dL 664  403  474   BUN 8 - 23 mg/dL 30  27  31    Creatinine 0.44 - 1.00 mg/dL 2.59  5.63  8.75   Sodium 135 - 145 mmol/L 136  136  135   Potassium 3.5 - 5.1 mmol/L 4.5  4.1  3.4   Chloride 98 - 111 mmol/L 100  101  104   CO2 22 - 32 mmol/L 26  24  23    Calcium 8.9 - 10.3 mg/dL 9.7  8.9  8.4   Total Protein 6.5 - 8.1 g/dL 7.6  6.7    Total Bilirubin 0.3 - 1.2 mg/dL 0.3  0.2    Alkaline Phos 38 - 126 U/L 105  91    AST 15 - 41 U/L 21  24    ALT 0 - 44 U/L 12  19

## 2022-12-31 NOTE — Telephone Encounter (Signed)
Fax sent to [facility] with Medication updates :   Iron-vitamin C 65-125 mg tab. - Take one tablet daily (rx sent to [facility])  Jakafi 10 mg in the morning, 5 mg in the evening  Fax confirmation received.

## 2022-12-31 NOTE — Progress Notes (Signed)
Specialty Pharmacy Refill Coordination Note  [name] is a 89 y.o. female contacted today regarding refills of specialty medication(s) Ruxolitinib Phosphate (Antineoplastic Enzyme Inhibitors) .  Patient requested Delivery  on 01/01/23  to verified address [address] [place] [address]   Medication will be filled on 12/31/22.

## 2022-12-31 NOTE — Assessment & Plan Note (Signed)
Anemia due to CKD, and myelofibrosis.   Monthly Retacrit 20,000 units, hold if Hb >=10

## 2023-01-01 DIAGNOSIS — C4492 Squamous cell carcinoma of skin, unspecified: Secondary | ICD-10-CM | POA: Diagnosis not present

## 2023-01-01 DIAGNOSIS — G309 Alzheimer's disease, unspecified: Secondary | ICD-10-CM | POA: Diagnosis not present

## 2023-01-01 DIAGNOSIS — E46 Unspecified protein-calorie malnutrition: Secondary | ICD-10-CM | POA: Diagnosis not present

## 2023-01-01 DIAGNOSIS — R269 Unspecified abnormalities of gait and mobility: Secondary | ICD-10-CM | POA: Diagnosis not present

## 2023-01-01 DIAGNOSIS — F02C11 Dementia in other diseases classified elsewhere, severe, with agitation: Secondary | ICD-10-CM | POA: Diagnosis not present

## 2023-01-02 DIAGNOSIS — G309 Alzheimer's disease, unspecified: Secondary | ICD-10-CM | POA: Diagnosis not present

## 2023-01-07 DIAGNOSIS — G301 Alzheimer's disease with late onset: Secondary | ICD-10-CM | POA: Diagnosis not present

## 2023-01-07 DIAGNOSIS — C44320 Squamous cell carcinoma of skin of unspecified parts of face: Secondary | ICD-10-CM | POA: Diagnosis not present

## 2023-01-07 DIAGNOSIS — F02C3 Dementia in other diseases classified elsewhere, severe, with mood disturbance: Secondary | ICD-10-CM | POA: Diagnosis not present

## 2023-01-07 DIAGNOSIS — N1832 Chronic kidney disease, stage 3b: Secondary | ICD-10-CM | POA: Diagnosis not present

## 2023-01-07 DIAGNOSIS — Z4800 Encounter for change or removal of nonsurgical wound dressing: Secondary | ICD-10-CM | POA: Diagnosis not present

## 2023-01-07 DIAGNOSIS — F32A Depression, unspecified: Secondary | ICD-10-CM | POA: Diagnosis not present

## 2023-01-08 DIAGNOSIS — Z79899 Other long term (current) drug therapy: Secondary | ICD-10-CM | POA: Diagnosis not present

## 2023-01-13 DIAGNOSIS — C44320 Squamous cell carcinoma of skin of unspecified parts of face: Secondary | ICD-10-CM | POA: Diagnosis not present

## 2023-01-13 DIAGNOSIS — F32A Depression, unspecified: Secondary | ICD-10-CM | POA: Diagnosis not present

## 2023-01-13 DIAGNOSIS — G301 Alzheimer's disease with late onset: Secondary | ICD-10-CM | POA: Diagnosis not present

## 2023-01-13 DIAGNOSIS — F02C3 Dementia in other diseases classified elsewhere, severe, with mood disturbance: Secondary | ICD-10-CM | POA: Diagnosis not present

## 2023-01-13 DIAGNOSIS — Z4800 Encounter for change or removal of nonsurgical wound dressing: Secondary | ICD-10-CM | POA: Diagnosis not present

## 2023-01-13 DIAGNOSIS — N1832 Chronic kidney disease, stage 3b: Secondary | ICD-10-CM | POA: Diagnosis not present

## 2023-01-16 DIAGNOSIS — R41841 Cognitive communication deficit: Secondary | ICD-10-CM | POA: Diagnosis not present

## 2023-01-16 DIAGNOSIS — C44320 Squamous cell carcinoma of skin of unspecified parts of face: Secondary | ICD-10-CM | POA: Diagnosis not present

## 2023-01-16 DIAGNOSIS — F02C3 Dementia in other diseases classified elsewhere, severe, with mood disturbance: Secondary | ICD-10-CM | POA: Diagnosis not present

## 2023-01-16 DIAGNOSIS — Z9181 History of falling: Secondary | ICD-10-CM | POA: Diagnosis not present

## 2023-01-16 DIAGNOSIS — D630 Anemia in neoplastic disease: Secondary | ICD-10-CM | POA: Diagnosis not present

## 2023-01-16 DIAGNOSIS — D45 Polycythemia vera: Secondary | ICD-10-CM | POA: Diagnosis not present

## 2023-01-16 DIAGNOSIS — N390 Urinary tract infection, site not specified: Secondary | ICD-10-CM | POA: Diagnosis not present

## 2023-01-16 DIAGNOSIS — Z7982 Long term (current) use of aspirin: Secondary | ICD-10-CM | POA: Diagnosis not present

## 2023-01-16 DIAGNOSIS — E875 Hyperkalemia: Secondary | ICD-10-CM | POA: Diagnosis not present

## 2023-01-16 DIAGNOSIS — E46 Unspecified protein-calorie malnutrition: Secondary | ICD-10-CM | POA: Diagnosis not present

## 2023-01-16 DIAGNOSIS — E039 Hypothyroidism, unspecified: Secondary | ICD-10-CM | POA: Diagnosis not present

## 2023-01-16 DIAGNOSIS — Z681 Body mass index (BMI) 19 or less, adult: Secondary | ICD-10-CM | POA: Diagnosis not present

## 2023-01-16 DIAGNOSIS — G301 Alzheimer's disease with late onset: Secondary | ICD-10-CM | POA: Diagnosis not present

## 2023-01-16 DIAGNOSIS — N1832 Chronic kidney disease, stage 3b: Secondary | ICD-10-CM | POA: Diagnosis not present

## 2023-01-16 DIAGNOSIS — Z85820 Personal history of malignant melanoma of skin: Secondary | ICD-10-CM | POA: Diagnosis not present

## 2023-01-16 DIAGNOSIS — F32A Depression, unspecified: Secondary | ICD-10-CM | POA: Diagnosis not present

## 2023-01-16 DIAGNOSIS — E782 Mixed hyperlipidemia: Secondary | ICD-10-CM | POA: Diagnosis not present

## 2023-01-16 DIAGNOSIS — D7581 Myelofibrosis: Secondary | ICD-10-CM | POA: Diagnosis not present

## 2023-01-16 DIAGNOSIS — Z4800 Encounter for change or removal of nonsurgical wound dressing: Secondary | ICD-10-CM | POA: Diagnosis not present

## 2023-01-19 DIAGNOSIS — G301 Alzheimer's disease with late onset: Secondary | ICD-10-CM | POA: Diagnosis not present

## 2023-01-19 DIAGNOSIS — C44320 Squamous cell carcinoma of skin of unspecified parts of face: Secondary | ICD-10-CM | POA: Diagnosis not present

## 2023-01-19 DIAGNOSIS — N1832 Chronic kidney disease, stage 3b: Secondary | ICD-10-CM | POA: Diagnosis not present

## 2023-01-19 DIAGNOSIS — F02C3 Dementia in other diseases classified elsewhere, severe, with mood disturbance: Secondary | ICD-10-CM | POA: Diagnosis not present

## 2023-01-19 DIAGNOSIS — Z4800 Encounter for change or removal of nonsurgical wound dressing: Secondary | ICD-10-CM | POA: Diagnosis not present

## 2023-01-19 DIAGNOSIS — F32A Depression, unspecified: Secondary | ICD-10-CM | POA: Diagnosis not present

## 2023-01-20 ENCOUNTER — Other Ambulatory Visit: Payer: Self-pay

## 2023-01-20 ENCOUNTER — Ambulatory Visit: Payer: Medicare Other | Admitting: Dermatology

## 2023-01-20 DIAGNOSIS — L578 Other skin changes due to chronic exposure to nonionizing radiation: Secondary | ICD-10-CM

## 2023-01-20 DIAGNOSIS — L821 Other seborrheic keratosis: Secondary | ICD-10-CM

## 2023-01-20 DIAGNOSIS — L820 Inflamed seborrheic keratosis: Secondary | ICD-10-CM | POA: Diagnosis not present

## 2023-01-20 DIAGNOSIS — D0472 Carcinoma in situ of skin of left lower limb, including hip: Secondary | ICD-10-CM

## 2023-01-20 DIAGNOSIS — D0461 Carcinoma in situ of skin of right upper limb, including shoulder: Secondary | ICD-10-CM

## 2023-01-20 DIAGNOSIS — Z5111 Encounter for antineoplastic chemotherapy: Secondary | ICD-10-CM | POA: Diagnosis not present

## 2023-01-20 DIAGNOSIS — Z85828 Personal history of other malignant neoplasm of skin: Secondary | ICD-10-CM

## 2023-01-20 DIAGNOSIS — W908XXA Exposure to other nonionizing radiation, initial encounter: Secondary | ICD-10-CM | POA: Diagnosis not present

## 2023-01-20 DIAGNOSIS — D492 Neoplasm of unspecified behavior of bone, soft tissue, and skin: Secondary | ICD-10-CM

## 2023-01-20 DIAGNOSIS — L853 Xerosis cutis: Secondary | ICD-10-CM

## 2023-01-20 DIAGNOSIS — D489 Neoplasm of uncertain behavior, unspecified: Secondary | ICD-10-CM

## 2023-01-20 MED ORDER — AMMONIUM LACTATE 12 % EX CREA: TOPICAL_CREAM | CUTANEOUS | 2 refills | Status: DC

## 2023-01-20 MED ORDER — HYDROCORTISONE 2.5 % EX CREA: TOPICAL_CREAM | CUTANEOUS | 3 refills | Status: DC

## 2023-01-20 MED ORDER — MUPIROCIN 2 % EX OINT: TOPICAL_OINTMENT | CUTANEOUS | 3 refills | Status: DC

## 2023-01-20 NOTE — Patient Instructions (Addendum)
For wound at right cheek Start mupirocin 2% ointment apply once daily and cover with bandage daily until healed. Can also use at dark crusty spot at legs   Start hydrocortisone 2.5 % cream  apply topically to itchy areas at right cheek twice daily as needed.   Topical steroids (such as triamcinolone, fluocinolone, fluocinonide, mometasone, clobetasol, halobetasol, betamethasone, hydrocortisone) can cause thinning and lightening of the skin if they are used for too long in the same area. Your physician has selected the right strength medicine for your problem and area affected on the body. Please use your medication only as directed by your physician to prevent side effects.      For crusted area at right ulnar hand  Restart - Start 5-fluorouracil/calcipotriene cream twice a day for 4 weeks to affected areas including right ulnar hand . Prescription sent to [facility]. Patient advised they will receive an email to purchase the medication online and have it sent to their home. Patient provided with handout reviewing treatment course and side effects and advised to call or message [place] on MyChart with any concerns.  Reviewed course of treatment and expected reaction.  Patient advised to expect inflammation and crusting and advised that erosions are possible.  Patient advised to be diligent with sun protection during and after treatment. Counseled to keep medication out of reach of children and pets.   For dry skin at arms and legs   Start amlactin 12 % cream apply topically daily to twice daily to dry areas on legs and arms.   Gentle Skin Care Guide  1. Bathe no more than once a day.  2. Avoid bathing in hot water  3. Use a mild soap like Dove, Vanicream, Cetaphil, CeraVe. Can use Lever 2000 or Cetaphil antibacterial soap  4. Use soap only where you need it. On most days, use it under your arms, between your legs, and on your feet. Let the water rinse other areas  unless visibly dirty.  5. When you get out of the bath/shower, use a towel to gently blot your skin dry, don't rub it.  6. While your skin is still a little damp, apply a moisturizing cream such as Vanicream, CeraVe, Cetaphil, Eucerin, Sarna lotion or plain Vaseline Jelly. For hands apply Neutrogena [place] Hand Cream or Excipial Hand Cream.  7. Reapply moisturizer any time you start to itch or feel dry.  8. Sometimes using free and clear laundry detergents can be helpful. Fabric softener sheets should be avoided. Downy Free & Gentle liquid, or any liquid fabric softener that is free of dyes and perfumes, it acceptable to use  9. If your doctor has given you prescription creams you may apply moisturizers over them       For crusted area at right side hand (right ulnar hand)   - Start 5-fluorouracil/calcipotriene cream twice a day for 4 weeks to affected areas including crusted spot at  right side of hand . Prescription sent to [facility]. Patient advised they will receive an email to purchase the medication online and have it sent to their home. Patient provided with handout reviewing treatment course and side effects and advised to call or message [place] on MyChart with any concerns.  Reviewed course of treatment and expected reaction.  Patient advised to expect inflammation and crusting and advised that erosions are possible.  Patient advised to be diligent with sun protection during and after treatment. Counseled to keep medication out of reach of  children and pets.    Instructions for [facility] Medications  One or more of your medications was sent to the [facility] mail order [facility] pharmacy. You will receive an email from them and can purchase the medicine through that link. It will then be mailed to your home at the address you confirmed. If for any reason you do not receive an email from them, please check your spam folder. If you still do not  find the email, please let us know. [facility] phone number is [phone].      5-Fluorouracil/Calcipotriene Patient Education   Actinic keratoses are the dry, red scaly spots on the skin caused by sun damage. A portion of these spots can turn into skin cancer with time, and treating them can help prevent development of skin cancer.   Treatment of these spots requires removal of the defective skin cells. There are various ways to remove actinic keratoses, including freezing with liquid nitrogen, treatment with creams, or treatment with a blue light procedure in the office.   5-fluorouracil cream is a topical cream used to treat actinic keratoses. It works by interfering with the growth of abnormal fast-growing skin cells, such as actinic keratoses. These cells peel off and are replaced by healthy ones.   5-fluorouracil/calcipotriene is a combination of the 5-fluorouracil cream with a vitamin D analog cream called calcipotriene. The calcipotriene alone does not treat actinic keratoses. However, when it is combined with 5-fluorouracil, it helps the 5-fluorouracil treat the actinic keratoses much faster so that the same results can be achieved with a much shorter treatment time.  INSTRUCTIONS FOR 5-FLUOROURACIL/CALCIPOTRIENE CREAM:   5-fluorouracil/calcipotriene cream typically only needs to be used for 4-7 days. A thin layer should be applied twice a day to the treatment areas recommended by your physician.   If your physician prescribed you separate tubes of 5-fluourouracil and calcipotriene, apply a thin layer of 5-fluorouracil followed by a thin layer of calcipotriene.   Avoid contact with your eyes, nostrils, and mouth. Do not use 5-fluorouracil/calcipotriene cream on infected or open wounds.   You will develop redness, irritation and some crusting at areas where you have pre-cancer damage/actinic keratoses. IF YOU DEVELOP PAIN, BLEEDING, OR SIGNIFICANT CRUSTING, STOP THE TREATMENT  EARLY - you have already gotten a good response and the actinic keratoses should clear up well.  Wash your hands after applying 5-fluorouracil 5% cream on your skin.   A moisturizer or sunscreen with a minimum SPF 30 should be applied each morning.   Once you have finished the treatment, you can apply a thin layer of Vaseline twice a day to irritated areas to soothe and calm the areas more quickly. If you experience significant discomfort, contact your physician.  For some patients it is necessary to repeat the treatment for best results.  SIDE EFFECTS: When using 5-fluorouracil/calcipotriene cream, you may have mild irritation, such as redness, dryness, swelling, or a mild burning sensation. This usually resolves within 2 weeks. The more actinic keratoses you have, the more redness and inflammation you can expect during treatment. Eye irritation has been reported rarely. If this occurs, please let us know.  If you have any trouble using this cream, please call the office. If you have any other questions about this information, please do not hesitate to ask me before you leave the office.    For wound at left medial leg (biopsy site)   Apply mupirocin ointment twice daily and cover with bandage until healed.  Biopsy Wound Care Instructions  Leave the original bandage on for 24 hours if possible.  If the bandage becomes soaked or soiled before that time, it is OK to remove it and examine the wound.  A small amount of post-operative bleeding is normal.  If excessive bleeding occurs, remove the bandage, place gauze over the site and apply continuous pressure (no peeking) over the area for 30 minutes. If this does not work, please call our clinic as soon as possible or page your doctor if it is after hours.   Once a day, cleanse the wound with soap and water. It is fine to shower. If a thick crust develops you may use a Q-tip dipped into dilute hydrogen peroxide (mix 1:1 with water) to  dissolve it.  Hydrogen peroxide can slow the healing process, so use it only as needed.    After washing, apply petroleum jelly (Vaseline) or an antibiotic ointment if your doctor prescribed one for you, followed by a bandage.    For best healing, the wound should be covered with a layer of ointment at all times. If you are not able to keep the area covered with a bandage to hold the ointment in place, this may mean re-applying the ointment several times a day.  Continue this wound care until the wound has healed and is no longer open.   Itching and mild discomfort is normal during the healing process. However, if you develop pain or severe itching, please call our office.   If you have any discomfort, you can take Tylenol (acetaminophen) or ibuprofen as directed on the bottle. (Please do not take these if you have an allergy to them or cannot take them for another reason).  Some redness, tenderness and white or yellow material in the wound is normal healing.  If the area becomes very sore and red, or develops a thick yellow-green material (pus), it may be infected; please notify us.    If you have stitches, return to clinic as directed to have the stitches removed. You will continue wound care for 2-3 days after the stitches are removed.   Wound healing continues for up to one year following surgery. It is not unusual to experience pain in the scar from time to time during the interval.  If the pain becomes severe or the scar thickens, you should notify the office.    A slight amount of redness in a scar is expected for the first six months.  After six months, the redness will fade and the scar will soften and fade.  The color difference becomes less noticeable with time.  If there are any problems, return for a post-op surgery check at your earliest convenience.  To improve the appearance of the scar, you can use silicone scar gel, cream, or sheets (such as Mederma or Serica) every night for up  to one year. These are available over the counter (without a prescription).  Please call our office at [phone] for any questions or concerns.     Due to recent changes in healthcare laws, you may see results of your pathology and/or laboratory studies on MyChart before the doctors have had a chance to review them. We understand that in some cases there may be results that are confusing or concerning to you. Please understand that not all results are received at the same time and often the doctors may need to interpret multiple results in order to provide you with the best plan of care or  course of treatment. Therefore, we ask that you please give [place] 2 business days to thoroughly review all your results before contacting the office for clarification. Should we see a critical lab result, you will be contacted sooner.   If You Need Anything After Your Visit  If you have any questions or concerns for your doctor, please call our main line at [phone] and press option 4 to reach your doctor's medical assistant. If no one answers, please leave a voicemail as directed and we will return your call as soon as possible. Messages left after 4 pm will be answered the following business day.   You may also send [place] a message via MyChart. We typically respond to MyChart messages within 1-2 business days.  For prescription refills, please ask your pharmacy to contact our office. Our fax number is [phone].  If you have an urgent issue when the clinic is closed that cannot wait until the next business day, you can page your doctor at the number below.    Please note that while we do our best to be available for urgent issues outside of office hours, we are not available 24/7.   If you have an urgent issue and are unable to reach [place], you may choose to seek medical care at your doctor's office, retail clinic, urgent care center, or emergency room.  If you have a medical emergency, please  immediately call 911 or go to the emergency department.  Pager Numbers  - Dr. Gwen Pounds: [phone]  - Dr. Roseanne Reno: [phone]  - Dr. Katrinka Blazing: [phone]   In the event of inclement weather, please call our main line at [phone] for an update on the status of any delays or closures.  Dermatology Medication Tips: Please keep the boxes that topical medications come in in order to help keep track of the instructions about where and how to use these. Pharmacies typically print the medication instructions only on the boxes and not directly on the medication tubes.   If your medication is too expensive, please contact our office at [phone] option 4 or send [place] a message through MyChart.   We are unable to tell what your co-pay for medications will be in advance as this is different depending on your insurance coverage. However, we may be able to find a substitute medication at lower cost or fill out paperwork to get insurance to cover a needed medication.   If a prior authorization is required to get your medication covered by your insurance company, please allow [place] 1-2 business days to complete this process.  Drug prices often vary depending on where the prescription is filled and some pharmacies may offer cheaper prices.  The website www.goodrx.com contains coupons for medications through different pharmacies. The prices here do not account for what the cost may be with help from insurance (it may be cheaper with your insurance), but the website can give you the price if you did not use any insurance.  - You can print the associated coupon and take it with your prescription to the pharmacy.  - You may also stop by our office during regular business hours and pick up a GoodRx coupon card.  - If you need your prescription sent electronically to a different pharmacy, notify our office through [facility] or by phone at [phone] option 4.     Si Usted Necesita Algo  Despus de Su Visita  Tambin puede enviarnos un mensaje a travs de Clinical cytogeneticist. Por lo general respondemos  a los mensajes de MyChart en el transcurso de 1 a 2 das hbiles.  Para renovar recetas, por favor pida a su farmacia que se ponga en contacto con nuestra oficina. Annie Sable de fax es [place] [phone].  Si tiene un asunto urgente cuando la clnica est cerrada y que no puede esperar hasta el siguiente da hbil, puede llamar/localizar a su doctor(a) al nmero que aparece a continuacin.   Por favor, tenga en cuenta que aunque hacemos todo lo posible para estar disponibles para asuntos urgentes fuera del horario de [place], no estamos disponibles las 24 horas del da, los 7 [address]  [address] de la [place].   Si tiene un problema urgente y no puede comunicarse con nosotros, puede optar por buscar atencin mdica  en el consultorio de su doctor(a), en una clnica privada, en un centro de atencin urgente o en una sala de emergencias.  Si tiene Engineer, drilling, por favor llame inmediatamente al 911 o vaya a la sala de emergencias.  Nmeros de bper  - Dr. Gwen Pounds: [phone]  - Dra. Roseanne Reno: [phone]  - Dr. Katrinka Blazing: [phone]   En caso de inclemencias del tiempo, por favor llame a Lacy Duverney principal al [phone] para una actualizacin sobre el Industry de cualquier retraso o cierre.  Consejos para la medicacin en dermatologa: Por favor, guarde las cajas en las que vienen los medicamentos de uso tpico para ayudarle a seguir las instrucciones sobre dnde y cmo usarlos. Las farmacias generalmente imprimen las instrucciones del medicamento slo en las cajas y no directamente en los tubos del [place].   Si su medicamento es muy caro, por favor, pngase en contacto con Rolm Gala llamando al [phone] y presione la opcin 4 o envenos un mensaje a travs de Clinical cytogeneticist.   No podemos decirle cul ser su copago por los medicamentos por adelantado ya que esto es diferente  dependiendo de la cobertura de su seguro. Sin embargo, es posible que podamos encontrar un medicamento sustituto a Audiological scientist un formulario para que el seguro cubra el medicamento que se considera necesario.   Si se requiere una autorizacin previa para que su compaa de seguros [place] su medicamento, por favor permtanos de 1 a 2 das hbiles para completar [address]  Los precios de los medicamentos varan con frecuencia dependiendo del Environmental consultant de dnde se surte la receta y alguna farmacias pueden ofrecer precios ms baratos.  El sitio web www.goodrx.com tiene cupones para medicamentos de Health and safety inspector. Los precios aqu no tienen en cuenta lo que podra costar con la ayuda del seguro (puede ser ms barato con su seguro), pero el sitio web puede darle el precio si no utiliz Tourist information centre manager.  - Puede imprimir el cupn correspondiente y llevarlo con su receta a la farmacia.  - Tambin puede pasar por nuestra oficina durante el horario de atencin regular y Education officer, museum una tarjeta de cupones de GoodRx.  - Si necesita que su receta se enve electrnicamente a una farmacia diferente, informe a nuestra oficina a travs de MyChart de  o por telfono llamando al [phone] y presione la opcin 4.

## 2023-01-20 NOTE — Progress Notes (Signed)
Specialty Pharmacy Refill Coordination Note  Laura Wilcox is a 89 y.o. female who's daughter, [name] was contacted today regarding refills of specialty medication(s) Ruxolitinib Phosphate (Antineoplastic Enzyme Inhibitors)   Patient requested Delivery   Delivery date: 01/24/23   Verified address: [address]   Medication will be filled on 01/23/23.

## 2023-01-20 NOTE — Progress Notes (Signed)
Specialty Pharmacy Ongoing Clinical Assessment Note  [name] is a 89 y.o. female who is being followed by the specialty pharmacy service for RxSp Bleeding Disorders   Patient's specialty medication(s) reviewed today: Ruxolitinib Phosphate (Antineoplastic Enzyme Inhibitors)   Missed doses in the last 4 weeks: 0   Patient/Caregiver did not have any additional questions or concerns.   Therapeutic benefit summary: Unable to assess   Adverse events/side effects summary: No adverse events/side effects   Patient's therapy is appropriate to: Continue    Goals Addressed             This Visit's Progress    Improve or maintain quality of life       Patient is on track. Patient will maintain adherence         Follow up:  6 months  Bobette Mo Specialty Pharmacist

## 2023-01-20 NOTE — Progress Notes (Signed)
Follow-Up Visit   Subjective  Laura Wilcox is a 89 y.o. female who presents for the following:  Patient here today with daughter concerning spots at right jaw line that is bleeding and oozing. Daughter reports home health nurse has been keeping it covered with bandage but wound will not go away. Daughter would also like legs and arms checked.   The patient has spots, moles and lesions to be evaluated, some may be new or changing and the patient may have concern these could be cancer.   The following portions of the chart were reviewed this encounter and updated as appropriate: medications, allergies, medical history  Review of Systems:  No other skin or systemic complaints except as noted in HPI or Assessment and Plan.  Objective  Well appearing patient in no apparent distress; mood and affect are within normal limits.  A focused examination was performed of the following areas: Right jawline b/l arms, and b/l legs   Relevant exam findings are noted in the Assessment and Plan.  right jawline, left and right calf area Erythematous stuck-on, waxy papule or plaque       left medial lower leg 1.5 cm keratotic nodule       right ulnar dorsal hand 2 cm pink scaly patch         Assessment & Plan    SEBORRHEIC KERATOSIS - Stuck-on, waxy, tan-brown papules and/or plaques including face, legs - Benign-appearing - Discussed benign etiology and prognosis. - Observe - Call for any changes  Xerosis at legs and arms Exam: - diffuse xerotic patches  Treatment Plan: Start amlactin 12 % cream apply topically qd/bid to dry areas at legs and arms.  - recommend gentle, hydrating skin care - gentle skin care handout given   ACTINIC DAMAGE - chronic, secondary to cumulative UV radiation exposure/sun exposure over time - diffuse scaly erythematous macules with underlying dyspigmentation - Recommend daily broad spectrum sunscreen SPF 30+ to sun-exposed areas, reapply  every 2 hours as needed.  - Recommend staying in the shade or wearing long sleeves, sun glasses (UVA+UVB protection) and wide brim hats (4-inch brim around the entire circumference of the hat). - Call for new or changing lesions.  Inflamed seborrheic keratosis right jawline, left and right calf area  With secondary infection  Start mupirocin 2 % ointment - apply topically to crusted lesions/wound twice daily and cover with bandage until healed. Including R cheek and legs  Start hydrocortisone 2.5 % cream - apply topically twice daily to aa of right cheek as needed for itch.   mupirocin ointment (BACTROBAN) 2 % - right jawline, left and right calf area Apply topically to wound at right jawline qd and cover with bandage until healed. Can use at any other crusted areas on legs and open wounds.  hydrocortisone 2.5 % cream - right jawline, left and right calf area Apply topically twice daily to aa of right cheek as needed for itch  Xerosis cutis  Related Medications ammonium lactate (AMLACTIN) 12 % cream Apply topically qd/bid to dry areas at lower legs and arms  Neoplasm of uncertain behavior left medial lower leg  Epidermal / dermal shaving  Lesion diameter (cm):  1.6 Informed consent: discussed and consent obtained   Patient was prepped and draped in usual sterile fashion: Area prepped with alcohol. Anesthesia: the lesion was anesthetized in a standard fashion   Anesthetic:  1% lidocaine w/ epinephrine 1-100,000 buffered w/ 8.4% NaHCO3 Instrument used: flexible razor blade   Hemostasis achieved  with: pressure, aluminum chloride and electrodesiccation   Outcome: patient tolerated procedure well   Post-procedure details: wound care instructions given   Post-procedure details comment:  Ointment and small bandage applied.  Additional details:  Excised down to fat layer  mupirocin ointment (BACTROBAN) 2 % Apply topically to wound at right jawline qd and cover with bandage until  healed. Can use at any other crusted areas on legs and open wounds.  Specimen 1 - Surgical pathology Differential Diagnosis: Cutaneous horn hypertrophic ak r/o scc   Check Margins: No  Cutaneous horn hypertrophic ak r/o scc   Shave removal to fat    Can use mupirocin ointment to wound and cover with bandage qd/bid until healed.    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 Prescription sent to [facility]. Patient advised they will receive an email to purchase the medication online and have it sent to their home. Patient provided with handout reviewing treatment course and side effects and advised to call or message [place] on MyChart with any concerns.  Reviewed course of treatment and expected reaction.  Patient advised to expect inflammation and crusting and advised that erosions are possible.  Patient advised to be diligent with sun protection during and after treatment. Counseled to keep medication out of reach of children and pets.  If not clear on f/up, recommend EDC   HISTORY OF SQUAMOUS CELL CARCINOMA OF THE SKIN Multiple locations see history  - No evidence of recurrence today - Recommend regular full body skin exams - Recommend daily broad spectrum sunscreen SPF 30+ to sun-exposed areas, reapply every 2 hours as needed.  - Call if any new or changing lesions are noted between office visits   Return for 2 - 3 months follow up on right hand .  I, Asher Muir, CMA, am acting as scribe for Willeen Niece, MD.   Documentation: I have reviewed the above documentation for accuracy and completeness, and I agree with the above.  Willeen Niece, MD

## 2023-01-21 DIAGNOSIS — F0390 Unspecified dementia without behavioral disturbance: Secondary | ICD-10-CM | POA: Diagnosis not present

## 2023-01-21 DIAGNOSIS — R54 Age-related physical debility: Secondary | ICD-10-CM | POA: Diagnosis not present

## 2023-01-21 DIAGNOSIS — D45 Polycythemia vera: Secondary | ICD-10-CM | POA: Diagnosis not present

## 2023-01-21 DIAGNOSIS — E640 Sequelae of protein-calorie malnutrition: Secondary | ICD-10-CM | POA: Diagnosis not present

## 2023-01-21 DIAGNOSIS — Z515 Encounter for palliative care: Secondary | ICD-10-CM | POA: Diagnosis not present

## 2023-01-22 DIAGNOSIS — D649 Anemia, unspecified: Secondary | ICD-10-CM | POA: Diagnosis not present

## 2023-01-22 DIAGNOSIS — F02C11 Dementia in other diseases classified elsewhere, severe, with agitation: Secondary | ICD-10-CM | POA: Diagnosis not present

## 2023-01-22 DIAGNOSIS — G309 Alzheimer's disease, unspecified: Secondary | ICD-10-CM | POA: Diagnosis not present

## 2023-01-22 DIAGNOSIS — N1832 Chronic kidney disease, stage 3b: Secondary | ICD-10-CM | POA: Diagnosis not present

## 2023-01-22 DIAGNOSIS — E8809 Other disorders of plasma-protein metabolism, not elsewhere classified: Secondary | ICD-10-CM | POA: Diagnosis not present

## 2023-01-22 DIAGNOSIS — E875 Hyperkalemia: Secondary | ICD-10-CM | POA: Diagnosis not present

## 2023-01-22 DIAGNOSIS — D45 Polycythemia vera: Secondary | ICD-10-CM | POA: Diagnosis not present

## 2023-01-23 DIAGNOSIS — C44320 Squamous cell carcinoma of skin of unspecified parts of face: Secondary | ICD-10-CM | POA: Diagnosis not present

## 2023-01-23 DIAGNOSIS — Z4800 Encounter for change or removal of nonsurgical wound dressing: Secondary | ICD-10-CM | POA: Diagnosis not present

## 2023-01-23 DIAGNOSIS — N1832 Chronic kidney disease, stage 3b: Secondary | ICD-10-CM | POA: Diagnosis not present

## 2023-01-23 LAB — SURGICAL PATHOLOGY

## 2023-01-27 ENCOUNTER — Telehealth: Payer: Self-pay

## 2023-01-27 NOTE — Telephone Encounter (Signed)
Advised patient's daughter, [name], of bx results./sh

## 2023-01-27 NOTE — Telephone Encounter (Signed)
-----   Message from Willeen Niece sent at 01/27/2023  8:35 AM EDT ----- 1. Skin, left medial lower leg :       SQUAMOUS CELL CARCINOMA IN SITU   SCCIS skin cancer- excision was excised down to fat during shave removal, so should already be removed.  Continue wound care and will observe for recurrence   - please call patient

## 2023-01-28 ENCOUNTER — Inpatient Hospital Stay: Payer: Medicare Other

## 2023-01-28 ENCOUNTER — Other Ambulatory Visit: Payer: PRIVATE HEALTH INSURANCE

## 2023-01-28 ENCOUNTER — Ambulatory Visit: Payer: PRIVATE HEALTH INSURANCE

## 2023-01-28 VITALS — BP 112/73 | HR 82

## 2023-01-28 DIAGNOSIS — D631 Anemia in chronic kidney disease: Secondary | ICD-10-CM

## 2023-01-28 DIAGNOSIS — F339 Major depressive disorder, recurrent, unspecified: Secondary | ICD-10-CM | POA: Diagnosis not present

## 2023-01-28 DIAGNOSIS — D45 Polycythemia vera: Secondary | ICD-10-CM

## 2023-01-28 DIAGNOSIS — D649 Anemia, unspecified: Secondary | ICD-10-CM | POA: Diagnosis not present

## 2023-01-28 DIAGNOSIS — F0390 Unspecified dementia without behavioral disturbance: Secondary | ICD-10-CM | POA: Diagnosis not present

## 2023-01-28 DIAGNOSIS — R7881 Bacteremia: Secondary | ICD-10-CM | POA: Diagnosis not present

## 2023-01-28 DIAGNOSIS — N189 Chronic kidney disease, unspecified: Secondary | ICD-10-CM | POA: Diagnosis not present

## 2023-01-28 DIAGNOSIS — R5383 Other fatigue: Secondary | ICD-10-CM | POA: Diagnosis not present

## 2023-01-28 LAB — CBC WITH DIFFERENTIAL (CANCER CENTER ONLY)
Abs Immature Granulocytes: 1.21 10*3/uL — ABNORMAL HIGH (ref 0.00–0.07)
Basophils Absolute: 0.3 10*3/uL — ABNORMAL HIGH (ref 0.0–0.1)
Basophils Relative: 2 %
Eosinophils Absolute: 0.7 10*3/uL — ABNORMAL HIGH (ref 0.0–0.5)
Eosinophils Relative: 5 %
HCT: 32.9 % — ABNORMAL LOW (ref 36.0–46.0)
Hemoglobin: 9.8 g/dL — ABNORMAL LOW (ref 12.0–15.0)
Immature Granulocytes: 9 %
Lymphocytes Relative: 11 %
Lymphs Abs: 1.4 10*3/uL (ref 0.7–4.0)
MCH: 26.9 pg (ref 26.0–34.0)
MCHC: 29.8 g/dL — ABNORMAL LOW (ref 30.0–36.0)
MCV: 90.4 fL (ref 80.0–100.0)
Monocytes Absolute: 1.1 10*3/uL — ABNORMAL HIGH (ref 0.1–1.0)
Monocytes Relative: 8 %
Neutro Abs: 8.5 10*3/uL — ABNORMAL HIGH (ref 1.7–7.7)
Neutrophils Relative %: 65 %
Platelet Count: 829 10*3/uL — ABNORMAL HIGH (ref 150–400)
RBC: 3.64 MIL/uL — ABNORMAL LOW (ref 3.87–5.11)
RDW: 20.1 % — ABNORMAL HIGH (ref 11.5–15.5)
WBC Count: 13.2 10*3/uL — ABNORMAL HIGH (ref 4.0–10.5)
nRBC: 0.2 % (ref 0.0–0.2)

## 2023-01-28 MED ORDER — EPOETIN ALFA-EPBX 20000 UNIT/ML IJ SOLN
20000.0000 [IU] | Freq: Once | INTRAMUSCULAR | Status: AC
  Administered 2023-01-28: 20000 [IU] via SUBCUTANEOUS

## 2023-01-29 DIAGNOSIS — E46 Unspecified protein-calorie malnutrition: Secondary | ICD-10-CM | POA: Diagnosis not present

## 2023-01-29 DIAGNOSIS — E039 Hypothyroidism, unspecified: Secondary | ICD-10-CM | POA: Diagnosis not present

## 2023-01-29 DIAGNOSIS — R2681 Unsteadiness on feet: Secondary | ICD-10-CM | POA: Diagnosis not present

## 2023-01-29 DIAGNOSIS — N1832 Chronic kidney disease, stage 3b: Secondary | ICD-10-CM | POA: Diagnosis not present

## 2023-01-29 DIAGNOSIS — C4492 Squamous cell carcinoma of skin, unspecified: Secondary | ICD-10-CM | POA: Diagnosis not present

## 2023-01-29 DIAGNOSIS — G309 Alzheimer's disease, unspecified: Secondary | ICD-10-CM | POA: Diagnosis not present

## 2023-01-29 DIAGNOSIS — F02C11 Dementia in other diseases classified elsewhere, severe, with agitation: Secondary | ICD-10-CM | POA: Diagnosis not present

## 2023-01-29 DIAGNOSIS — D45 Polycythemia vera: Secondary | ICD-10-CM | POA: Diagnosis not present

## 2023-01-29 DIAGNOSIS — D649 Anemia, unspecified: Secondary | ICD-10-CM | POA: Diagnosis not present

## 2023-02-02 DIAGNOSIS — Z79899 Other long term (current) drug therapy: Secondary | ICD-10-CM | POA: Diagnosis not present

## 2023-02-04 DIAGNOSIS — F32A Depression, unspecified: Secondary | ICD-10-CM | POA: Diagnosis not present

## 2023-02-04 DIAGNOSIS — Z4800 Encounter for change or removal of nonsurgical wound dressing: Secondary | ICD-10-CM | POA: Diagnosis not present

## 2023-02-04 DIAGNOSIS — G301 Alzheimer's disease with late onset: Secondary | ICD-10-CM | POA: Diagnosis not present

## 2023-02-04 DIAGNOSIS — N1832 Chronic kidney disease, stage 3b: Secondary | ICD-10-CM | POA: Diagnosis not present

## 2023-02-04 DIAGNOSIS — F02C3 Dementia in other diseases classified elsewhere, severe, with mood disturbance: Secondary | ICD-10-CM | POA: Diagnosis not present

## 2023-02-04 DIAGNOSIS — C44320 Squamous cell carcinoma of skin of unspecified parts of face: Secondary | ICD-10-CM | POA: Diagnosis not present

## 2023-02-05 DIAGNOSIS — N1832 Chronic kidney disease, stage 3b: Secondary | ICD-10-CM | POA: Diagnosis not present

## 2023-02-05 DIAGNOSIS — F02C11 Dementia in other diseases classified elsewhere, severe, with agitation: Secondary | ICD-10-CM | POA: Diagnosis not present

## 2023-02-05 DIAGNOSIS — D45 Polycythemia vera: Secondary | ICD-10-CM | POA: Diagnosis not present

## 2023-02-05 DIAGNOSIS — E039 Hypothyroidism, unspecified: Secondary | ICD-10-CM | POA: Diagnosis not present

## 2023-02-05 DIAGNOSIS — D638 Anemia in other chronic diseases classified elsewhere: Secondary | ICD-10-CM | POA: Diagnosis not present

## 2023-02-05 DIAGNOSIS — G309 Alzheimer's disease, unspecified: Secondary | ICD-10-CM | POA: Diagnosis not present

## 2023-02-05 DIAGNOSIS — E875 Hyperkalemia: Secondary | ICD-10-CM | POA: Diagnosis not present

## 2023-02-05 DIAGNOSIS — M7981 Nontraumatic hematoma of soft tissue: Secondary | ICD-10-CM | POA: Diagnosis not present

## 2023-02-10 DIAGNOSIS — F02C3 Dementia in other diseases classified elsewhere, severe, with mood disturbance: Secondary | ICD-10-CM | POA: Diagnosis not present

## 2023-02-10 DIAGNOSIS — G301 Alzheimer's disease with late onset: Secondary | ICD-10-CM | POA: Diagnosis not present

## 2023-02-10 DIAGNOSIS — N1832 Chronic kidney disease, stage 3b: Secondary | ICD-10-CM | POA: Diagnosis not present

## 2023-02-10 DIAGNOSIS — Z4800 Encounter for change or removal of nonsurgical wound dressing: Secondary | ICD-10-CM | POA: Diagnosis not present

## 2023-02-10 DIAGNOSIS — C44320 Squamous cell carcinoma of skin of unspecified parts of face: Secondary | ICD-10-CM | POA: Diagnosis not present

## 2023-02-10 DIAGNOSIS — F32A Depression, unspecified: Secondary | ICD-10-CM | POA: Diagnosis not present

## 2023-02-12 DIAGNOSIS — N183 Chronic kidney disease, stage 3 unspecified: Secondary | ICD-10-CM | POA: Diagnosis not present

## 2023-02-12 DIAGNOSIS — R0981 Nasal congestion: Secondary | ICD-10-CM | POA: Diagnosis not present

## 2023-02-12 DIAGNOSIS — F02C0 Dementia in other diseases classified elsewhere, severe, without behavioral disturbance, psychotic disturbance, mood disturbance, and anxiety: Secondary | ICD-10-CM | POA: Diagnosis not present

## 2023-02-12 DIAGNOSIS — G309 Alzheimer's disease, unspecified: Secondary | ICD-10-CM | POA: Diagnosis not present

## 2023-02-12 DIAGNOSIS — U071 COVID-19: Secondary | ICD-10-CM | POA: Diagnosis not present

## 2023-02-12 DIAGNOSIS — R197 Diarrhea, unspecified: Secondary | ICD-10-CM | POA: Diagnosis not present

## 2023-02-15 DIAGNOSIS — Z681 Body mass index (BMI) 19 or less, adult: Secondary | ICD-10-CM | POA: Diagnosis not present

## 2023-02-15 DIAGNOSIS — F02C3 Dementia in other diseases classified elsewhere, severe, with mood disturbance: Secondary | ICD-10-CM | POA: Diagnosis not present

## 2023-02-15 DIAGNOSIS — Z483 Aftercare following surgery for neoplasm: Secondary | ICD-10-CM | POA: Diagnosis not present

## 2023-02-15 DIAGNOSIS — F32A Depression, unspecified: Secondary | ICD-10-CM | POA: Diagnosis not present

## 2023-02-15 DIAGNOSIS — R41841 Cognitive communication deficit: Secondary | ICD-10-CM | POA: Diagnosis not present

## 2023-02-15 DIAGNOSIS — E875 Hyperkalemia: Secondary | ICD-10-CM | POA: Diagnosis not present

## 2023-02-15 DIAGNOSIS — E782 Mixed hyperlipidemia: Secondary | ICD-10-CM | POA: Diagnosis not present

## 2023-02-15 DIAGNOSIS — D631 Anemia in chronic kidney disease: Secondary | ICD-10-CM | POA: Diagnosis not present

## 2023-02-15 DIAGNOSIS — Z4801 Encounter for change or removal of surgical wound dressing: Secondary | ICD-10-CM | POA: Diagnosis not present

## 2023-02-15 DIAGNOSIS — Z87440 Personal history of urinary (tract) infections: Secondary | ICD-10-CM | POA: Diagnosis not present

## 2023-02-15 DIAGNOSIS — D45 Polycythemia vera: Secondary | ICD-10-CM | POA: Diagnosis not present

## 2023-02-15 DIAGNOSIS — E46 Unspecified protein-calorie malnutrition: Secondary | ICD-10-CM | POA: Diagnosis not present

## 2023-02-15 DIAGNOSIS — Z7982 Long term (current) use of aspirin: Secondary | ICD-10-CM | POA: Diagnosis not present

## 2023-02-15 DIAGNOSIS — N1832 Chronic kidney disease, stage 3b: Secondary | ICD-10-CM | POA: Diagnosis not present

## 2023-02-15 DIAGNOSIS — Z85820 Personal history of malignant melanoma of skin: Secondary | ICD-10-CM | POA: Diagnosis not present

## 2023-02-15 DIAGNOSIS — D7581 Myelofibrosis: Secondary | ICD-10-CM | POA: Diagnosis not present

## 2023-02-15 DIAGNOSIS — L820 Inflamed seborrheic keratosis: Secondary | ICD-10-CM | POA: Diagnosis not present

## 2023-02-15 DIAGNOSIS — Z9181 History of falling: Secondary | ICD-10-CM | POA: Diagnosis not present

## 2023-02-15 DIAGNOSIS — E039 Hypothyroidism, unspecified: Secondary | ICD-10-CM | POA: Diagnosis not present

## 2023-02-15 DIAGNOSIS — G301 Alzheimer's disease with late onset: Secondary | ICD-10-CM | POA: Diagnosis not present

## 2023-02-17 DIAGNOSIS — N1832 Chronic kidney disease, stage 3b: Secondary | ICD-10-CM | POA: Diagnosis not present

## 2023-02-17 DIAGNOSIS — L820 Inflamed seborrheic keratosis: Secondary | ICD-10-CM | POA: Diagnosis not present

## 2023-02-17 DIAGNOSIS — G301 Alzheimer's disease with late onset: Secondary | ICD-10-CM | POA: Diagnosis not present

## 2023-02-17 DIAGNOSIS — Z4801 Encounter for change or removal of surgical wound dressing: Secondary | ICD-10-CM | POA: Diagnosis not present

## 2023-02-17 DIAGNOSIS — G309 Alzheimer's disease, unspecified: Secondary | ICD-10-CM | POA: Diagnosis not present

## 2023-02-17 DIAGNOSIS — D631 Anemia in chronic kidney disease: Secondary | ICD-10-CM | POA: Diagnosis not present

## 2023-02-17 DIAGNOSIS — Z483 Aftercare following surgery for neoplasm: Secondary | ICD-10-CM | POA: Diagnosis not present

## 2023-02-17 DIAGNOSIS — F339 Major depressive disorder, recurrent, unspecified: Secondary | ICD-10-CM | POA: Diagnosis not present

## 2023-02-18 ENCOUNTER — Other Ambulatory Visit: Payer: Self-pay

## 2023-02-19 DIAGNOSIS — R54 Age-related physical debility: Secondary | ICD-10-CM | POA: Diagnosis not present

## 2023-02-19 DIAGNOSIS — E640 Sequelae of protein-calorie malnutrition: Secondary | ICD-10-CM | POA: Diagnosis not present

## 2023-02-19 DIAGNOSIS — F0390 Unspecified dementia without behavioral disturbance: Secondary | ICD-10-CM | POA: Diagnosis not present

## 2023-02-19 DIAGNOSIS — U071 COVID-19: Secondary | ICD-10-CM | POA: Diagnosis not present

## 2023-02-19 DIAGNOSIS — N1831 Chronic kidney disease, stage 3a: Secondary | ICD-10-CM | POA: Diagnosis not present

## 2023-02-19 DIAGNOSIS — R197 Diarrhea, unspecified: Secondary | ICD-10-CM | POA: Diagnosis not present

## 2023-02-19 DIAGNOSIS — F02C0 Dementia in other diseases classified elsewhere, severe, without behavioral disturbance, psychotic disturbance, mood disturbance, and anxiety: Secondary | ICD-10-CM | POA: Diagnosis not present

## 2023-02-19 DIAGNOSIS — D45 Polycythemia vera: Secondary | ICD-10-CM | POA: Diagnosis not present

## 2023-02-19 DIAGNOSIS — Z515 Encounter for palliative care: Secondary | ICD-10-CM | POA: Diagnosis not present

## 2023-02-19 DIAGNOSIS — G309 Alzheimer's disease, unspecified: Secondary | ICD-10-CM | POA: Diagnosis not present

## 2023-02-21 ENCOUNTER — Other Ambulatory Visit: Payer: Self-pay

## 2023-02-24 ENCOUNTER — Other Ambulatory Visit: Payer: Self-pay

## 2023-02-24 ENCOUNTER — Other Ambulatory Visit: Payer: Self-pay | Admitting: Oncology

## 2023-02-24 DIAGNOSIS — G301 Alzheimer's disease with late onset: Secondary | ICD-10-CM | POA: Diagnosis not present

## 2023-02-24 DIAGNOSIS — D631 Anemia in chronic kidney disease: Secondary | ICD-10-CM | POA: Diagnosis not present

## 2023-02-24 DIAGNOSIS — Z4801 Encounter for change or removal of surgical wound dressing: Secondary | ICD-10-CM | POA: Diagnosis not present

## 2023-02-24 DIAGNOSIS — N1832 Chronic kidney disease, stage 3b: Secondary | ICD-10-CM | POA: Diagnosis not present

## 2023-02-24 DIAGNOSIS — Z483 Aftercare following surgery for neoplasm: Secondary | ICD-10-CM | POA: Diagnosis not present

## 2023-02-24 DIAGNOSIS — L820 Inflamed seborrheic keratosis: Secondary | ICD-10-CM | POA: Diagnosis not present

## 2023-02-24 DIAGNOSIS — D45 Polycythemia vera: Secondary | ICD-10-CM

## 2023-02-24 MED ORDER — RUXOLITINIB PHOSPHATE 5 MG PO TABS
5.0000 mg | ORAL_TABLET | ORAL | 1 refills | Status: DC
  Filled 2023-02-25: qty 90, 30d supply, fill #0

## 2023-02-24 NOTE — Progress Notes (Signed)
Specialty Pharmacy Refill Coordination Note  Laura Wilcox is a 89 y.o. female contacted today regarding refills of specialty medication(s) Ruxolitinib Phosphate (Antineoplastic Enzyme Inhibitors)   Patient requested Delivery   Delivery date: 02/26/23   Verified address: [address]   Medication will be filled on 02/25/23, pending refill request.

## 2023-02-24 NOTE — Telephone Encounter (Signed)
CBC with Differential (Cancer Center Only) Order: [id number] Status: Final result     Visible to patient: Yes (seen)     Next appt: 02/25/2023 at 01:45 PM in Oncology ([facility])     Dx: Anemia in stage 3b chronic kidney dis...   0 Result Notes          Component Ref Range & Units 3 wk ago 1 mo ago 2 mo ago 3 mo ago 4 mo ago 5 mo ago 6 mo ago  WBC Count 4.0 - 10.5 K/uL 13.2 High  12.7 High  13.3 High  12.4 High  11.7 High  12.2 High  10.9 High   RBC 3.87 - 5.11 MIL/uL 3.64 Low  3.83 Low  3.66 Low  3.46 Low  3.51 Low  3.19 Low  2.99 Low   Hemoglobin 12.0 - 15.0 g/dL 9.8 Low  40.9 Low  81.1 Low  10.0 Low  10.4 Low  9.6 Low  9.0 Low   HCT 36.0 - 46.0 % 32.9 Low  34.6 Low  34.2 Low  32.9 Low  33.0 Low  31.0 Low  29.0 Low   MCV 80.0 - 100.0 fL 90.4 90.3 93.4 95.1 94.0 97.2 97.0  MCH 26.0 - 34.0 pg 26.9 27.4 29.0 28.9 29.6 30.1 30.1  MCHC 30.0 - 36.0 g/dL 91.4 Low  78.2 95.6 21.3 31.5 31.0 31.0  RDW 11.5 - 15.5 % 20.1 High  20.7 High  21.2 High  22.0 High  22.8 High  22.8 High  23.9 High   Platelet Count 150 - 400 K/uL 829 High  843 High  742 High  623 High  747 High  801 High  775 High   nRBC 0.0 - 0.2 % 0.2 0.0 0.0 0.2 0.2 0.2 0.4 High   Neutrophils Relative % % 65 59 62 60 68 67 64  Neutro Abs 1.7 - 7.7 K/uL 8.5 High  7.5 8.2 High  7.6 7.9 High  8.2 High  7.1  Lymphocytes Relative % 11 15 16 15 11 11 13   Lymphs Abs 0.7 - 4.0 K/uL 1.4 1.9 2.1 1.8 1.3 1.4 1.4  Monocytes Relative % 8 10 10 10 8 9 9   Monocytes Absolute 0.1 - 1.0 K/uL 1.1 High  1.3 High  1.3 High  1.2 High  0.9 1.2 High  1.0  Eosinophils Relative % 5 6 5 5 4 5 5   Eosinophils Absolute 0.0 - 0.5 K/uL 0.7 High  0.7 High  0.7 High  0.6 High  0.5 0.6 High  0.5  Basophils Relative % 2 3 2 2 2 2 2   Basophils Absolute 0.0 - 0.1 K/uL 0.3 High  0.3 High  0.2 High  0.3 High  0.2 High  0.2 High  0.2 High   WBC Morphology Moderate Left Shift (>5% metas and myelos) Marked Left Shift (>5% metas, myelos and pros) CM  Mild Left Shift (1-5% metas, occ myelo) Moderate Left Shift (>5% metas and myelos) Mild Left Shift (1-5% metas, occ myelo) Moderate Left Shift (>5% metas and myelos) Mild Left Shift (1-5% metas, occ myelo) CM  Comment: DIFF. CONFIRMED BY SMEAR  RBC Morphology MORPHOLOGY UNREMARKABLE MORPHOLOGY UNREMARKABLE MIXED RBC POPULATION See Note CM  MIXED RBC POPULATION MORPHOLOGY UNREMARKABLE  Smear Review GIANT PLATELETS SEEN GIANT PLATELETS SEEN CM PLATELETS APPEAR INCREASED CM PLATELETS APPEAR INCREASED CM PLATELETS APPEAR INCREASED CM PLATELETS APPEAR INCREASED CM PLATELETS APPEAR INCREASED CM  Comment: PLATELETS APPEAR INCREASED  Immature Granulocytes % 9 7 6 8 7  6  7  Abs Immature Granulocytes 0.00 - 0.07 K/uL 1.21 High  0.89 High  CM 0.75 High  CM 0.94 High  0.82 High  0.69 High  CM 0.72 High   Comment: Performed at [facility], [address] [place], [place] [address]  Polychromasia    PRESENT CM PRESENT    Ovalocytes     PRESENT CM    Giant PLTs       PRESENT CM  Resulting Agency [facility]         Specimen Collected: 01/28/23 13:35 Last Resulted: 01/28/23 14:24    CMP (Cancer Center only) Order: [id number] Status: Final result     Visible to patient: Yes (seen)     Next appt: 02/25/2023 at 01:45 PM in Oncology ([facility])     Dx: Polycythemia vera (HCC)   0 Result Notes          Component Ref Range & Units 1 mo ago (12/30/22) 4 mo ago (10/02/22) 7 mo ago (07/10/22) 7 mo ago (07/08/22) 10 mo ago (04/02/22) 1 yr ago (01/29/22) 1 yr ago (12/17/21)  Sodium 135 - 145 mmol/L 136 136 135 136 139 135 138  Potassium 3.5 - 5.1 mmol/L 4.5 4.1 3.4 Low  2.7 Low Panic  CM 3.6 4.6 3.8  Chloride 98 - 111 mmol/L 100 101 104 103 101 101 104  CO2 22 - 32 mmol/L 26 24 23 24 24 26 27   Glucose, Bld 70 - 99 mg/dL 191 High  478 High  CM 195 High  CM 196 High  CM 124 High  CM 176 High  CM 126 High  CM  Comment:  Glucose reference range applies only to samples taken after fasting for at least 8 hours.  BUN 8 - 23 mg/dL 30 High  27 High  31 High  37 High  25 High  36 High  30 High   Creatinine 0.44 - 1.00 mg/dL 2.95 High  6.21 High  3.08 High  1.44 High  1.50 High  1.46 High  1.36 High   Calcium 8.9 - 10.3 mg/dL 9.7 8.9 8.4 Low  8.8 Low  9.2 9.1 8.9  Total Protein 6.5 - 8.1 g/dL 7.6 6.7  6.2 Low  7.0 7.1 7.1  Albumin 3.5 - 5.0 g/dL 3.8 3.8  3.4 Low  3.8 4.2 4.1  AST 15 - 41 U/L 21 24  22 25 21 17   ALT 0 - 44 U/L 12 19  16 16 15 12   Alkaline Phosphatase 38 - 126 U/L 105 91  93 102 79 95  Total Bilirubin 0.3 - 1.2 mg/dL 0.3 0.2 Low   0.4 0.4 0.4 0.3  GFR, Estimated >60 mL/min 35 Low  33 Low  CM 34 Low  CM      Comment: (NOTE) Calculated using the CKD-EPI Creatinine Equation (2021)  Anion gap 5 - 15 10 11  CM 8 CM 9 CM 14 CM 8 CM 7 CM  Comment: Performed at [facility], [address] [place], [place] [address]  Resulting Agency [facility]         Specimen Collected: 12/30/22 11:18 Last Resulted: 12/30/22 11:48

## 2023-02-25 ENCOUNTER — Other Ambulatory Visit: Payer: Self-pay

## 2023-02-25 ENCOUNTER — Inpatient Hospital Stay: Payer: Medicare Other | Attending: Oncology

## 2023-02-25 ENCOUNTER — Other Ambulatory Visit: Payer: PRIVATE HEALTH INSURANCE

## 2023-02-25 ENCOUNTER — Inpatient Hospital Stay: Payer: Medicare Other

## 2023-02-25 ENCOUNTER — Ambulatory Visit: Payer: PRIVATE HEALTH INSURANCE

## 2023-02-25 DIAGNOSIS — Z79899 Other long term (current) drug therapy: Secondary | ICD-10-CM | POA: Diagnosis not present

## 2023-02-25 DIAGNOSIS — D7581 Myelofibrosis: Secondary | ICD-10-CM | POA: Insufficient documentation

## 2023-02-25 DIAGNOSIS — D45 Polycythemia vera: Secondary | ICD-10-CM | POA: Diagnosis not present

## 2023-02-25 DIAGNOSIS — F339 Major depressive disorder, recurrent, unspecified: Secondary | ICD-10-CM | POA: Diagnosis not present

## 2023-02-25 DIAGNOSIS — D649 Anemia, unspecified: Secondary | ICD-10-CM | POA: Diagnosis not present

## 2023-02-25 DIAGNOSIS — N1832 Chronic kidney disease, stage 3b: Secondary | ICD-10-CM

## 2023-02-25 LAB — CBC WITH DIFFERENTIAL (CANCER CENTER ONLY)
Abs Immature Granulocytes: 0.81 10*3/uL — ABNORMAL HIGH (ref 0.00–0.07)
Basophils Absolute: 0.2 10*3/uL — ABNORMAL HIGH (ref 0.0–0.1)
Basophils Relative: 2 %
Eosinophils Absolute: 0.5 10*3/uL (ref 0.0–0.5)
Eosinophils Relative: 5 %
HCT: 34.3 % — ABNORMAL LOW (ref 36.0–46.0)
Hemoglobin: 10.3 g/dL — ABNORMAL LOW (ref 12.0–15.0)
Immature Granulocytes: 8 %
Lymphocytes Relative: 15 %
Lymphs Abs: 1.5 10*3/uL (ref 0.7–4.0)
MCH: 26.7 pg (ref 26.0–34.0)
MCHC: 30 g/dL (ref 30.0–36.0)
MCV: 88.9 fL (ref 80.0–100.0)
Monocytes Absolute: 0.9 10*3/uL (ref 0.1–1.0)
Monocytes Relative: 9 %
Neutro Abs: 6.1 10*3/uL (ref 1.7–7.7)
Neutrophils Relative %: 61 %
Platelet Count: 677 10*3/uL — ABNORMAL HIGH (ref 150–400)
RBC: 3.86 MIL/uL — ABNORMAL LOW (ref 3.87–5.11)
RDW: 22.2 % — ABNORMAL HIGH (ref 11.5–15.5)
WBC Count: 9.8 10*3/uL (ref 4.0–10.5)
nRBC: 0.3 % — ABNORMAL HIGH (ref 0.0–0.2)

## 2023-02-25 NOTE — Progress Notes (Signed)
Hbg 10.3. Hold retacrit.

## 2023-02-26 DIAGNOSIS — R2681 Unsteadiness on feet: Secondary | ICD-10-CM | POA: Diagnosis not present

## 2023-02-26 DIAGNOSIS — G309 Alzheimer's disease, unspecified: Secondary | ICD-10-CM | POA: Diagnosis not present

## 2023-02-26 DIAGNOSIS — F02C0 Dementia in other diseases classified elsewhere, severe, without behavioral disturbance, psychotic disturbance, mood disturbance, and anxiety: Secondary | ICD-10-CM | POA: Diagnosis not present

## 2023-02-26 DIAGNOSIS — Z85828 Personal history of other malignant neoplasm of skin: Secondary | ICD-10-CM | POA: Diagnosis not present

## 2023-02-26 DIAGNOSIS — E43 Unspecified severe protein-calorie malnutrition: Secondary | ICD-10-CM | POA: Diagnosis not present

## 2023-03-03 DIAGNOSIS — D631 Anemia in chronic kidney disease: Secondary | ICD-10-CM | POA: Diagnosis not present

## 2023-03-03 DIAGNOSIS — N1832 Chronic kidney disease, stage 3b: Secondary | ICD-10-CM | POA: Diagnosis not present

## 2023-03-03 DIAGNOSIS — Z483 Aftercare following surgery for neoplasm: Secondary | ICD-10-CM | POA: Diagnosis not present

## 2023-03-03 DIAGNOSIS — L820 Inflamed seborrheic keratosis: Secondary | ICD-10-CM | POA: Diagnosis not present

## 2023-03-03 DIAGNOSIS — Z4801 Encounter for change or removal of surgical wound dressing: Secondary | ICD-10-CM | POA: Diagnosis not present

## 2023-03-03 DIAGNOSIS — G301 Alzheimer's disease with late onset: Secondary | ICD-10-CM | POA: Diagnosis not present

## 2023-03-07 ENCOUNTER — Emergency Department: Payer: Medicare Other

## 2023-03-07 ENCOUNTER — Other Ambulatory Visit: Payer: Self-pay

## 2023-03-07 ENCOUNTER — Emergency Department
Admission: EM | Admit: 2023-03-07 | Discharge: 2023-03-07 | Disposition: A | Discharge status: Home / Self Care | Payer: Medicare Other | Attending: Student in an Organized Health Care Education/Training Program | Admitting: Student in an Organized Health Care Education/Training Program

## 2023-03-07 ENCOUNTER — Encounter: Payer: Self-pay | Admitting: Emergency Medicine

## 2023-03-07 DIAGNOSIS — S199XXA Unspecified injury of neck, initial encounter: Secondary | ICD-10-CM | POA: Diagnosis not present

## 2023-03-07 DIAGNOSIS — S0990XA Unspecified injury of head, initial encounter: Secondary | ICD-10-CM | POA: Diagnosis present

## 2023-03-07 DIAGNOSIS — F0390 Unspecified dementia without behavioral disturbance: Secondary | ICD-10-CM | POA: Insufficient documentation

## 2023-03-07 DIAGNOSIS — R9431 Abnormal electrocardiogram [ECG] [EKG]: Secondary | ICD-10-CM | POA: Diagnosis not present

## 2023-03-07 DIAGNOSIS — S0003XA Contusion of scalp, initial encounter: Secondary | ICD-10-CM | POA: Insufficient documentation

## 2023-03-07 DIAGNOSIS — S06310A Contusion and laceration of right cerebrum without loss of consciousness, initial encounter: Secondary | ICD-10-CM | POA: Diagnosis not present

## 2023-03-07 DIAGNOSIS — W19XXXA Unspecified fall, initial encounter: Secondary | ICD-10-CM | POA: Diagnosis not present

## 2023-03-07 DIAGNOSIS — G44309 Post-traumatic headache, unspecified, not intractable: Secondary | ICD-10-CM | POA: Diagnosis not present

## 2023-03-07 LAB — BASIC METABOLIC PANEL
Anion gap: 10 (ref 5–15)
BUN: 23 mg/dL (ref 8–23)
CO2: 26 mmol/L (ref 22–32)
Calcium: 9 mg/dL (ref 8.9–10.3)
Chloride: 101 mmol/L (ref 98–111)
Creatinine, Ser: 1.47 mg/dL — ABNORMAL HIGH (ref 0.44–1.00)
GFR, Estimated: 33 mL/min — ABNORMAL LOW (ref >60)
Glucose, Bld: 107 mg/dL — ABNORMAL HIGH (ref 70–99)
Potassium: 4.3 mmol/L (ref 3.5–5.1)
Sodium: 137 mmol/L (ref 135–145)

## 2023-03-07 LAB — CBC WITH DIFFERENTIAL/PLATELET
Abs Immature Granulocytes: 0.31 10*3/uL — ABNORMAL HIGH (ref 0.00–0.07)
Basophils Absolute: 0.1 10*3/uL (ref 0.0–0.1)
Basophils Relative: 1 %
Eosinophils Absolute: 0.4 10*3/uL (ref 0.0–0.5)
Eosinophils Relative: 5 %
HCT: 32 % — ABNORMAL LOW (ref 36.0–46.0)
Hemoglobin: 9.9 g/dL — ABNORMAL LOW (ref 12.0–15.0)
Immature Granulocytes: 4 %
Lymphocytes Relative: 11 %
Lymphs Abs: 0.9 10*3/uL (ref 0.7–4.0)
MCH: 27.3 pg (ref 26.0–34.0)
MCHC: 30.9 g/dL (ref 30.0–36.0)
MCV: 88.4 fL (ref 80.0–100.0)
Monocytes Absolute: 1.1 10*3/uL — ABNORMAL HIGH (ref 0.1–1.0)
Monocytes Relative: 14 %
Neutro Abs: 4.9 10*3/uL (ref 1.7–7.7)
Neutrophils Relative %: 65 %
Platelets: 496 10*3/uL — ABNORMAL HIGH (ref 150–400)
RBC: 3.62 MIL/uL — ABNORMAL LOW (ref 3.87–5.11)
RDW: 21.8 % — ABNORMAL HIGH (ref 11.5–15.5)
Smear Review: NORMAL
WBC: 7.6 10*3/uL (ref 4.0–10.5)
nRBC: 0 % (ref 0.0–0.2)

## 2023-03-07 LAB — CK: Total CK: 64 U/L (ref 38–234)

## 2023-03-07 LAB — TROPONIN I (HIGH SENSITIVITY): Troponin I (High Sensitivity): 8 ng/L (ref <18)

## 2023-03-07 NOTE — ED Triage Notes (Signed)
Patient to ED via [facility] from [facility] after an unwitnessed fall. Patient unsure how she fell. Large hematoma noted to the back of her head. Pt not on blood thinners. Hx of dementia

## 2023-03-07 NOTE — ED Provider Triage Note (Signed)
Emergency Medicine Provider Triage Evaluation Note  Laura Wilcox , a 89 y.o. female  was evaluated in triage.  Pt complains of unwitnessed fall, hit head, hx dementia.  Review of Systems  Positive:  Negative:   Physical Exam  BP 132/73 (BP Location: Right Arm)   Pulse 83   Temp 98.1 F (36.7 C) (Oral)   Resp 18   Ht 5' (1.524 m)   Wt 55 kg   SpO2 96%   BMI 23.68 kg/m  Gen:   Awake, no distress   Resp:  Normal effort  MSK:   Moves extremities without difficulty  Other:    Medical Decision Making  Medically screening exam initiated at 12:11 PM.  Appropriate orders placed.  [name] was informed that the remainder of the evaluation will be completed by another provider, this initial triage assessment does not replace that evaluation, and the importance of remaining in the ED until their evaluation is complete.  Large hematoma, instructed staff to do workup as unwitnessed fall   Faythe Ghee, PA-C 03/07/23 1212

## 2023-03-07 NOTE — ED Provider Notes (Signed)
[facility] Provider Note    Event Date/Time   First MD Initiated Contact with Patient 03/07/23 1342     (approximate)   History   Fall   HPI  Laura Wilcox is a 89 y.o. female presents to the ER for evaluation of head injury and fall.  Patient with history of dementia.  Found by family member on the ground uncertain as to how long she was down.  Patient denies any complaints other than some pain in the back of her head where she has a large goose egg.  Is not on any blood thinners.  Has no other complaints or concerns at this time.     Physical Exam   Triage Vital Signs: ED Triage Vitals [03/07/23 1208]  Encounter Vitals Group     BP 132/73     Systolic BP Percentile      Diastolic BP Percentile      Pulse Rate 83     Resp 18     Temp 98.1 F (36.7 C)     Temp Source Oral     SpO2 96 %     Weight 121 lb 4.1 oz (55 kg)     Height 5' (1.524 m)     Head Circumference      Peak Flow      Pain Score 10     Pain Loc      Pain Education      Exclude from Growth Chart     Most recent vital signs: Vitals:   03/07/23 1208  BP: 132/73  Pulse: 83  Resp: 18  Temp: 98.1 F (36.7 C)  SpO2: 96%     Constitutional: Alert  Eyes: Conjunctivae are normal.  Head: large size contusion to the right parietal scalp. Nose: No congestion/rhinnorhea. Mouth/Throat: Mucous membranes are moist.   Neck: Painless ROM.  Cardiovascular:   Good peripheral circulation. Respiratory: Normal respiratory effort.  No retractions.  Gastrointestinal: Soft and nontender.  Musculoskeletal:  no deformity Neurologic:  MAE spontaneously. No gross focal neurologic deficits are appreciated.  Skin:  Skin is warm, dry and intact. No rash noted. Psychiatric: Mood and affect are normal. Speech and behavior are normal.    ED Results / Procedures / Treatments   Labs (all labs ordered are listed, but only abnormal results are displayed) Labs Reviewed  CBC WITH  DIFFERENTIAL/PLATELET - Abnormal; Notable for the following components:      Result Value   RBC 3.62 (*)    Hemoglobin 9.9 (*)    HCT 32.0 (*)    RDW 21.8 (*)    Platelets 496 (*)    Monocytes Absolute 1.1 (*)    Abs Immature Granulocytes 0.31 (*)    All other components within normal limits  BASIC METABOLIC PANEL - Abnormal; Notable for the following components:   Glucose, Bld 107 (*)    Creatinine, Ser 1.47 (*)    GFR, Estimated 33 (*)    All other components within normal limits  CK  URINALYSIS, ROUTINE W REFLEX MICROSCOPIC  TROPONIN I (HIGH SENSITIVITY)  TROPONIN I (HIGH SENSITIVITY)     EKG ED ECG REPORT I, Willy Eddy, the attending physician, personally viewed and interpreted this ECG.   Date: 03/07/2023  EKG Time: 12:21  Rate: 85  Rhythm: sinus  Axis: normal  Intervals: normal  ST&T Change: no stemi, no depressions     RADIOLOGY Please see ED Course for my review and interpretation.  I personally reviewed all  radiographic images ordered to evaluate for the above acute complaints and reviewed radiology reports and findings.  These findings were personally discussed with the patient.  Please see medical record for radiology report.    PROCEDURES:  Critical Care performed: No  Procedures   MEDICATIONS ORDERED IN ED: Medications - No data to display   IMPRESSION / MDM / ASSESSMENT AND PLAN / ED COURSE  I reviewed the triage vital signs and the nursing notes.                              Differential diagnosis includes, but is not limited to, dysrhythmia, SDH, IPH, contusion, fracture, rhabdo, electrolyte abnormality  Patient presenting to the ER for evaluation of symptoms as described above.  Based on symptoms, risk factors and considered above differential, this presenting complaint could reflect a potentially life-threatening illness therefore the patient will be placed on continuous pulse oximetry and telemetry for monitoring.  Laboratory  evaluation will be sent to evaluate for the above complaints.  CT head on my review and interpretation does not show any evidence of SDH or herniation.  No evidence of fracture.   Clinical Course as of 03/07/23 1411  Fri Mar 07, 2023  1411 CK is normal.  Patient otherwise well-appearing no acute distress.  I do believe she stable and appropriate for outpatient follow-up. [PR]    Clinical Course User Index [PR] Willy Eddy, MD     FINAL CLINICAL IMPRESSION(S) / ED DIAGNOSES   Final diagnoses:  Scalp hematoma, initial encounter     Rx / DC Orders   ED Discharge Orders     None        Note:  This document was prepared using Dragon voice recognition software and may include unintentional dictation errors.    Willy Eddy, MD 03/07/23 [phone]

## 2023-03-10 DIAGNOSIS — N1832 Chronic kidney disease, stage 3b: Secondary | ICD-10-CM | POA: Diagnosis not present

## 2023-03-10 DIAGNOSIS — G301 Alzheimer's disease with late onset: Secondary | ICD-10-CM | POA: Diagnosis not present

## 2023-03-10 DIAGNOSIS — L820 Inflamed seborrheic keratosis: Secondary | ICD-10-CM | POA: Diagnosis not present

## 2023-03-10 DIAGNOSIS — F32A Depression, unspecified: Secondary | ICD-10-CM | POA: Diagnosis not present

## 2023-03-10 DIAGNOSIS — I872 Venous insufficiency (chronic) (peripheral): Secondary | ICD-10-CM | POA: Diagnosis not present

## 2023-03-10 DIAGNOSIS — Z483 Aftercare following surgery for neoplasm: Secondary | ICD-10-CM | POA: Diagnosis not present

## 2023-03-10 DIAGNOSIS — R238 Other skin changes: Secondary | ICD-10-CM | POA: Diagnosis not present

## 2023-03-10 DIAGNOSIS — R262 Difficulty in walking, not elsewhere classified: Secondary | ICD-10-CM | POA: Diagnosis not present

## 2023-03-10 DIAGNOSIS — R2689 Other abnormalities of gait and mobility: Secondary | ICD-10-CM | POA: Diagnosis not present

## 2023-03-10 DIAGNOSIS — F03911 Unspecified dementia, unspecified severity, with agitation: Secondary | ICD-10-CM | POA: Diagnosis not present

## 2023-03-10 DIAGNOSIS — L989 Disorder of the skin and subcutaneous tissue, unspecified: Secondary | ICD-10-CM | POA: Diagnosis not present

## 2023-03-10 DIAGNOSIS — D631 Anemia in chronic kidney disease: Secondary | ICD-10-CM | POA: Diagnosis not present

## 2023-03-10 DIAGNOSIS — Z4801 Encounter for change or removal of surgical wound dressing: Secondary | ICD-10-CM | POA: Diagnosis not present

## 2023-03-10 DIAGNOSIS — M6281 Muscle weakness (generalized): Secondary | ICD-10-CM | POA: Diagnosis not present

## 2023-03-10 DIAGNOSIS — B351 Tinea unguium: Secondary | ICD-10-CM | POA: Diagnosis not present

## 2023-03-19 ENCOUNTER — Other Ambulatory Visit: Payer: Self-pay

## 2023-03-19 DIAGNOSIS — D649 Anemia, unspecified: Secondary | ICD-10-CM | POA: Diagnosis not present

## 2023-03-19 DIAGNOSIS — F339 Major depressive disorder, recurrent, unspecified: Secondary | ICD-10-CM | POA: Diagnosis not present

## 2023-03-20 DIAGNOSIS — E559 Vitamin D deficiency, unspecified: Secondary | ICD-10-CM | POA: Diagnosis not present

## 2023-03-20 DIAGNOSIS — R2681 Unsteadiness on feet: Secondary | ICD-10-CM | POA: Diagnosis not present

## 2023-03-20 DIAGNOSIS — G309 Alzheimer's disease, unspecified: Secondary | ICD-10-CM | POA: Diagnosis not present

## 2023-03-20 DIAGNOSIS — F02C0 Dementia in other diseases classified elsewhere, severe, without behavioral disturbance, psychotic disturbance, mood disturbance, and anxiety: Secondary | ICD-10-CM | POA: Diagnosis not present

## 2023-03-20 DIAGNOSIS — F331 Major depressive disorder, recurrent, moderate: Secondary | ICD-10-CM | POA: Diagnosis not present

## 2023-03-20 DIAGNOSIS — G301 Alzheimer's disease with late onset: Secondary | ICD-10-CM | POA: Diagnosis not present

## 2023-03-20 DIAGNOSIS — N1831 Chronic kidney disease, stage 3a: Secondary | ICD-10-CM | POA: Diagnosis not present

## 2023-03-24 ENCOUNTER — Encounter (HOSPITAL_COMMUNITY): Payer: Self-pay

## 2023-03-24 ENCOUNTER — Other Ambulatory Visit (HOSPITAL_COMMUNITY): Payer: Self-pay

## 2023-03-27 ENCOUNTER — Other Ambulatory Visit: Payer: Self-pay

## 2023-03-31 ENCOUNTER — Inpatient Hospital Stay
Admission: RE | Admit: 2023-03-31 | Discharge: 2023-04-11 | DRG: 580 | Disposition: A | Discharge status: Home Health | Payer: Medicare Other | Source: Skilled Nursing Facility | Attending: Internal Medicine | Admitting: Internal Medicine

## 2023-03-31 ENCOUNTER — Other Ambulatory Visit: Payer: Self-pay

## 2023-03-31 ENCOUNTER — Other Ambulatory Visit: Payer: PRIVATE HEALTH INSURANCE

## 2023-03-31 ENCOUNTER — Emergency Department: Payer: Medicare Other

## 2023-03-31 DIAGNOSIS — I2510 Atherosclerotic heart disease of native coronary artery without angina pectoris: Secondary | ICD-10-CM | POA: Diagnosis present

## 2023-03-31 DIAGNOSIS — I959 Hypotension, unspecified: Secondary | ICD-10-CM | POA: Diagnosis not present

## 2023-03-31 DIAGNOSIS — Z888 Allergy status to other drugs, medicaments and biological substances status: Secondary | ICD-10-CM

## 2023-03-31 DIAGNOSIS — S9002XA Contusion of left ankle, initial encounter: Principal | ICD-10-CM | POA: Insufficient documentation

## 2023-03-31 DIAGNOSIS — R609 Edema, unspecified: Secondary | ICD-10-CM | POA: Diagnosis not present

## 2023-03-31 DIAGNOSIS — E785 Hyperlipidemia, unspecified: Secondary | ICD-10-CM | POA: Diagnosis present

## 2023-03-31 DIAGNOSIS — E860 Dehydration: Secondary | ICD-10-CM | POA: Diagnosis present

## 2023-03-31 DIAGNOSIS — Z90710 Acquired absence of both cervix and uterus: Secondary | ICD-10-CM

## 2023-03-31 DIAGNOSIS — Z79899 Other long term (current) drug therapy: Secondary | ICD-10-CM

## 2023-03-31 DIAGNOSIS — R0902 Hypoxemia: Secondary | ICD-10-CM | POA: Diagnosis present

## 2023-03-31 DIAGNOSIS — D45 Polycythemia vera: Secondary | ICD-10-CM | POA: Diagnosis present

## 2023-03-31 DIAGNOSIS — T148XXA Other injury of unspecified body region, initial encounter: Principal | ICD-10-CM | POA: Diagnosis present

## 2023-03-31 DIAGNOSIS — Z23 Encounter for immunization: Secondary | ICD-10-CM

## 2023-03-31 DIAGNOSIS — Z79890 Hormone replacement therapy (postmenopausal): Secondary | ICD-10-CM

## 2023-03-31 DIAGNOSIS — D509 Iron deficiency anemia, unspecified: Secondary | ICD-10-CM | POA: Diagnosis present

## 2023-03-31 DIAGNOSIS — S91012A Laceration without foreign body, left ankle, initial encounter: Secondary | ICD-10-CM | POA: Diagnosis present

## 2023-03-31 DIAGNOSIS — S91002A Unspecified open wound, left ankle, initial encounter: Secondary | ICD-10-CM | POA: Diagnosis not present

## 2023-03-31 DIAGNOSIS — R58 Hemorrhage, not elsewhere classified: Secondary | ICD-10-CM | POA: Diagnosis not present

## 2023-03-31 DIAGNOSIS — N179 Acute kidney failure, unspecified: Secondary | ICD-10-CM | POA: Diagnosis present

## 2023-03-31 DIAGNOSIS — Z8249 Family history of ischemic heart disease and other diseases of the circulatory system: Secondary | ICD-10-CM

## 2023-03-31 DIAGNOSIS — Z7982 Long term (current) use of aspirin: Secondary | ICD-10-CM

## 2023-03-31 DIAGNOSIS — Z882 Allergy status to sulfonamides status: Secondary | ICD-10-CM

## 2023-03-31 DIAGNOSIS — Z9181 History of falling: Secondary | ICD-10-CM

## 2023-03-31 DIAGNOSIS — Z85828 Personal history of other malignant neoplasm of skin: Secondary | ICD-10-CM

## 2023-03-31 DIAGNOSIS — D7581 Myelofibrosis: Secondary | ICD-10-CM | POA: Diagnosis present

## 2023-03-31 DIAGNOSIS — J449 Chronic obstructive pulmonary disease, unspecified: Secondary | ICD-10-CM | POA: Diagnosis present

## 2023-03-31 DIAGNOSIS — Z66 Do not resuscitate: Secondary | ICD-10-CM | POA: Diagnosis present

## 2023-03-31 LAB — HEMOGLOBIN AND HEMATOCRIT, BLOOD
HCT: 24.7 % — ABNORMAL LOW (ref 36.0–46.0)
HCT: 22.9 % — ABNORMAL LOW (ref 36.0–46.0)
Hemoglobin: 7.7 g/dL — ABNORMAL LOW (ref 12.0–15.0)
Hemoglobin: 7.1 g/dL — ABNORMAL LOW (ref 12.0–15.0)

## 2023-03-31 LAB — CBC WITH DIFFERENTIAL/PLATELET
Abs Immature Granulocytes: 0.53 10*3/uL — ABNORMAL HIGH (ref 0.00–0.07)
Basophils Absolute: 0.2 10*3/uL — ABNORMAL HIGH (ref 0.0–0.1)
Basophils Relative: 2 %
Eosinophils Absolute: 0.4 10*3/uL (ref 0.0–0.5)
Eosinophils Relative: 4 %
HCT: 28.9 % — ABNORMAL LOW (ref 36.0–46.0)
Hemoglobin: 8.9 g/dL — ABNORMAL LOW (ref 12.0–15.0)
Immature Granulocytes: 5 %
Lymphocytes Relative: 15 %
Lymphs Abs: 1.5 10*3/uL (ref 0.7–4.0)
MCH: 27.6 pg (ref 26.0–34.0)
MCHC: 30.8 g/dL (ref 30.0–36.0)
MCV: 89.8 fL (ref 80.0–100.0)
Monocytes Absolute: 1 10*3/uL (ref 0.1–1.0)
Monocytes Relative: 10 %
Neutro Abs: 6.5 10*3/uL (ref 1.7–7.7)
Neutrophils Relative %: 64 %
Platelets: 646 10*3/uL — ABNORMAL HIGH (ref 150–400)
RBC: 3.22 MIL/uL — ABNORMAL LOW (ref 3.87–5.11)
RDW: 24.8 % — ABNORMAL HIGH (ref 11.5–15.5)
Smear Review: NORMAL
WBC: 10.1 10*3/uL (ref 4.0–10.5)
nRBC: 0.3 % — ABNORMAL HIGH (ref 0.0–0.2)

## 2023-03-31 LAB — COMPREHENSIVE METABOLIC PANEL
ALT: 12 U/L (ref 0–44)
AST: 18 U/L (ref 15–41)
Albumin: 3.7 g/dL (ref 3.5–5.0)
Alkaline Phosphatase: 129 U/L — ABNORMAL HIGH (ref 38–126)
Anion gap: 11 (ref 5–15)
BUN: 25 mg/dL — ABNORMAL HIGH (ref 8–23)
CO2: 24 mmol/L (ref 22–32)
Calcium: 9 mg/dL (ref 8.9–10.3)
Chloride: 102 mmol/L (ref 98–111)
Creatinine, Ser: 1.09 mg/dL — ABNORMAL HIGH (ref 0.44–1.00)
GFR, Estimated: 47 mL/min — ABNORMAL LOW (ref >60)
Glucose, Bld: 98 mg/dL (ref 70–99)
Potassium: 4.5 mmol/L (ref 3.5–5.1)
Sodium: 137 mmol/L (ref 135–145)
Total Bilirubin: 0.8 mg/dL (ref <1.2)
Total Protein: 6.7 g/dL (ref 6.5–8.1)

## 2023-03-31 LAB — IRON AND TIBC
Iron: 78 ug/dL (ref 28–170)
Saturation Ratios: 26 % (ref 10.4–31.8)
TIBC: 302 ug/dL (ref 250–450)
UIBC: 224 ug/dL

## 2023-03-31 LAB — RETICULOCYTES
Immature Retic Fract: 33.2 % — ABNORMAL HIGH (ref 2.3–15.9)
RBC.: 3.21 MIL/uL — ABNORMAL LOW (ref 3.87–5.11)
Retic Count, Absolute: 89.6 10*3/uL (ref 19.0–186.0)
Retic Ct Pct: 2.8 % (ref 0.4–3.1)

## 2023-03-31 LAB — FERRITIN: Ferritin: 280 ng/mL (ref 11–307)

## 2023-03-31 LAB — LACTIC ACID, PLASMA
Lactic Acid, Venous: 1 mmol/L (ref 0.5–1.9)
Lactic Acid, Venous: 1 mmol/L (ref 0.5–1.9)

## 2023-03-31 LAB — PROTIME-INR
INR: 1 (ref 0.8–1.2)
Prothrombin Time: 13.2 s (ref 11.4–15.2)

## 2023-03-31 LAB — APTT: aPTT: 30 s (ref 24–36)

## 2023-03-31 MED ORDER — ASPIRIN 81 MG PO TBEC: 81.0000 mg | DELAYED_RELEASE_TABLET | Freq: Every day | ORAL | Status: DC

## 2023-03-31 MED ORDER — HYDROMORPHONE HCL 1 MG/ML IJ SOLN
0.5000 mg | INTRAMUSCULAR | Status: DC | PRN
  Administered 2023-03-31 – 2023-04-04 (×4): 1 mg via INTRAVENOUS
  Filled 2023-03-31 (×4): qty 1

## 2023-03-31 MED ORDER — TETANUS-DIPHTH-ACELL PERTUSSIS 5-2.5-18.5 LF-MCG/0.5 IM SUSY
0.5000 mL | PREFILLED_SYRINGE | Freq: Once | INTRAMUSCULAR | Status: AC
  Administered 2023-03-31: 0.5 mL via INTRAMUSCULAR
  Filled 2023-03-31: qty 0.5

## 2023-03-31 MED ORDER — POTASSIUM CHLORIDE CRYS ER 20 MEQ PO TBCR
20.0000 meq | EXTENDED_RELEASE_TABLET | Freq: Two times a day (BID) | ORAL | Status: DC
Start: 2023-03-31 — End: 2023-04-01
  Administered 2023-03-31 – 2023-04-01 (×2): 20 meq via ORAL
  Filled 2023-03-31 (×2): qty 1

## 2023-03-31 MED ORDER — POLYETHYLENE GLYCOL 3350 17 G PO PACK: 17.0000 g | PACK | Freq: Every day | ORAL | Status: DC | PRN

## 2023-03-31 MED ORDER — HYDROCORTISONE 1 % EX CREA
1.0000 | TOPICAL_CREAM | Freq: Two times a day (BID) | CUTANEOUS | Status: DC
  Administered 2023-04-02 – 2023-04-11 (×17): 1 via TOPICAL
  Filled 2023-03-31 (×2): qty 28

## 2023-03-31 MED ORDER — VITAMIN B-12 1000 MCG PO TABS
1000.0000 ug | ORAL_TABLET | Freq: Every day | ORAL | Status: DC
  Administered 2023-03-31 – 2023-04-11 (×11): 1000 ug via ORAL
  Filled 2023-03-31 (×8): qty 1
  Filled 2023-03-31: qty 2
  Filled 2023-03-31 (×2): qty 1

## 2023-03-31 MED ORDER — RUXOLITINIB PHOSPHATE 5 MG PO TABS: 5.0000 mg | ORAL_TABLET | ORAL | Status: DC

## 2023-03-31 MED ORDER — FERROUS SULFATE 325 (65 FE) MG PO TABS
325.0000 mg | ORAL_TABLET | Freq: Every day | ORAL | Status: DC
Start: 2023-04-01 — End: 2023-04-11
  Administered 2023-04-01 – 2023-04-11 (×11): 325 mg via ORAL
  Filled 2023-03-31 (×11): qty 1

## 2023-03-31 MED ORDER — ACETAMINOPHEN 325 MG PO TABS
325.0000 mg | ORAL_TABLET | Freq: Four times a day (QID) | ORAL | Status: DC | PRN
  Administered 2023-04-02 – 2023-04-06 (×6): 650 mg via ORAL
  Filled 2023-03-31 (×8): qty 2

## 2023-03-31 MED ORDER — ONDANSETRON HCL 4 MG/2ML IJ SOLN: 4.0000 mg | Freq: Four times a day (QID) | INTRAMUSCULAR | Status: DC | PRN

## 2023-03-31 MED ORDER — SODIUM CHLORIDE 0.9 % IV SOLN
INTRAVENOUS | Status: AC
Start: 2023-03-31 — End: 2023-03-31

## 2023-03-31 MED ORDER — CITALOPRAM HYDROBROMIDE 20 MG PO TABS
20.0000 mg | ORAL_TABLET | Freq: Every day | ORAL | Status: DC
  Administered 2023-04-01 – 2023-04-11 (×11): 20 mg via ORAL
  Filled 2023-03-31 (×11): qty 1

## 2023-03-31 MED ORDER — SODIUM CHLORIDE 0.9 % IV BOLUS
1000.0000 mL | Freq: Once | INTRAVENOUS | Status: AC
  Administered 2023-03-31: 1000 mL via INTRAVENOUS

## 2023-03-31 MED ORDER — ENSURE ENLIVE PO LIQD
237.0000 mL | Freq: Two times a day (BID) | ORAL | Status: DC
  Administered 2023-04-04 – 2023-04-11 (×10): 237 mL via ORAL

## 2023-03-31 MED ORDER — RUXOLITINIB PHOSPHATE 5 MG PO TABS: 10.0000 mg | ORAL_TABLET | Freq: Every day | ORAL | Status: DC

## 2023-03-31 MED ORDER — ONDANSETRON HCL 4 MG PO TABS: 4.0000 mg | ORAL_TABLET | Freq: Four times a day (QID) | ORAL | Status: DC | PRN

## 2023-03-31 MED ORDER — RUXOLITINIB PHOSPHATE 5 MG PO TABS
5.0000 mg | ORAL_TABLET | Freq: Every day | ORAL | Status: DC
  Administered 2023-03-31: 5 mg via ORAL

## 2023-03-31 MED ORDER — SODIUM CHLORIDE 0.9 % IV SOLN: INTRAVENOUS | Status: DC

## 2023-03-31 NOTE — ED Provider Triage Note (Signed)
Emergency Medicine Provider Triage Evaluation Note  Laura Wilcox , a 89 y.o. female  was evaluated in triage.  Pt complains of bleeding from wound on left ankle. Patient here via EMS from [facility].  Hx of dementia.  Staff changed dressing and then noted area bleeding.  Takes ASA.   Review of Systems  Positive:  Dementia, bleeding wound.  Negative:   Physical Exam  There were no vitals taken for this visit. Gen:   Awake, no distress  Baseline dementia per EMS/staff Resp:  Normal effort  MSK:   Moves extremities without difficulty  Other:  Left ankle bleeding.   Medical Decision Making  Medically screening exam initiated at 9:11 AM.  Appropriate orders placed.  Laura Wilcox was informed that the remainder of the evaluation will be completed by another provider, this initial triage assessment does not replace that evaluation, and the importance of remaining in the ED until their evaluation is complete.     Tommi Rumps, PA-C 03/31/23 [phone]

## 2023-03-31 NOTE — ED Notes (Signed)
UNABLE TO PERFORM ORTHO STATICS DUE TO PATIENTS INABILITY TO PUT WEIGHT ON LEG AND ALTERED MENTAL STATUS PREVENTING PATIENT FROM PERFORMING TASKS RELATED TO OBTAINING OVS

## 2023-03-31 NOTE — H&P (Signed)
History and Physical    Miles Gade RUE:[id number] DOB: 08-27-1926 DOA: 03/31/2023  PCP: Smiley Houseman, NP (Confirm with patient/family/NH records and if not entered, this has to be entered at [facility] point of entry) Patient coming from: SNF  I have personally briefly reviewed patient's old medical records in [facility]  Chief Complaint: Ankle laceration  HPI: Laura Wilcox is a 89 y.o. female with medical history significant of advanced dementia, polycythemia vera and chronic myelofibrosis on chemotherapy, chronic iron deficiency anemia, chronic ambulation impairment, was found on the floor with a large left ankle hematoma this morning.  Patient does not remember what has happened, and this morning patient was found on the floor awake, with a large left inner ankle hematoma.  Patient does not remember whether she had a fall or hit anything with the left foot last night or this morning.  Family at bedside reported that the patient sustained a unwitnessed fall 3 weeks ago and hit her head with a large hematoma but no LOC.  At baseline, patient has dementia and family reported that the patient prefers to staying " dehydrated" by avoiding drinking water so that "does not have to change diaper" On arrival in the ED, patient was found to be hypotensive blood pressure 75/35, none tachycardia nonhypoxic afebrile.  Blood work showed hemoglobin 8.9 compared to baseline 9.9, platelet 646, creatinine 1.0, BUN 25, glucose 98.  Patient was given 1 L of IV bolus in the ED.  ED physician tried to manually extract hematoma and clots from the left ankle.   Review of Systems: Unable to perform, patient is demented  Past Medical History:  Diagnosis Date   Acute metabolic encephalopathy 10/22/2020   Confusion 12/30/2018   Polycythemia    SCC (squamous cell carcinoma) 04/11/2021   R med dorsum hand - ED&C SCCIS   SCC (squamous cell carcinoma) 10/09/2021   left lateral eye, EDC   SCC (squamous  cell carcinoma) 01/28/2022   left lateral eye, recurrent, EDC. 5FU/Calcipotriene cream.   SCC (squamous cell carcinoma) 01/28/2022   in situ, left medial cheek, 5FU/Calcipotriene Cr   SCC (squamous cell carcinoma) 02/26/2022   SCC IS, Right Ulnar Dorsal Hand, 5FU/Calcipotriene cream   SCC (squamous cell carcinoma) 09/03/2022   R ankle, EDC   SCC (squamous cell carcinoma) 01/20/2023   SCC IS Left medial lower leg, EDC   Squamous cell carcinoma of skin 09/06/2020   R thumb - ED&C   Urinary tract infection without hematuria 01/02/2020   Urticaria 06/02/2017    Past Surgical History:  Procedure Laterality Date   ABDOMINAL HYSTERECTOMY     complete   BREAST SURGERY       reports that she has never smoked. She has never used smokeless tobacco. She reports that she does not drink alcohol and does not use drugs.  Allergies  Allergen Reactions   Bactrim [Sulfamethoxazole-Trimethoprim]     Due to kidney disease    Epinephrine Other (See Comments)    Family History  Problem Relation Age of Onset   Heart disease Father    Cancer Sister        breast   Cancer Sister        breast   Alzheimer's disease Sister      Prior to Admission medications   Medication Sig Start Date End Date Taking? Authorizing Provider  acetaminophen (TYLENOL) 325 MG tablet Take 325-650 mg by mouth every 6 (six) hours as needed for mild pain or moderate pain.  [provider]  ammonium lactate (AMLACTIN) 12 % cream Apply topically qd/bid to dry areas at lower legs and arms 01/20/23   Willeen Niece, MD  aspirin EC 81 MG tablet Take 81 mg by mouth daily. Swallow whole.    [provider]  calcitRIOL (ROCALTROL) 0.25 MCG capsule Take 0.25 mcg by mouth every Monday, Wednesday, and Friday.    [provider]  citalopram (CELEXA) 20 MG tablet Take 1 tablet (20 mg total) by mouth daily. 09/27/22   Borders, Daryl Eastern, NP  feeding supplement (ENSURE ENLIVE / ENSURE PLUS) LIQD Take 237 mLs  by mouth 2 (two) times daily between meals. 07/02/21   Arnetha Courser, MD  hydrocortisone 2.5 % cream Apply topically twice daily to aa of right cheek as needed for itch 01/20/23   Willeen Niece, MD  Iron-Vitamin C 65-125 MG TABS Take 1 tablet by mouth daily. 12/31/22   Rickard Patience, MD  mupirocin ointment (BACTROBAN) 2 % Apply topically to wound at right jawline qd and cover with bandage until healed. Can use at any other crusted areas on legs and open wounds. 01/20/23   Willeen Niece, MD  polyethylene glycol (MIRALAX / GLYCOLAX) 17 g packet Take 17 g by mouth daily as needed for mild constipation. 07/02/21   Arnetha Courser, MD  potassium chloride SA (KLOR-CON M) 20 MEQ tablet Take 1 tablet (20 mEq total) by mouth 2 (two) times daily. 07/08/22   Rickard Patience, MD  ruxolitinib phosphate (JAKAFI) 5 MG tablet Take 1 tablet (5 mg total) by mouth See admin instructions. Jakafi 10 mg in the morning, 5 mg in the evening. 02/24/23   Rickard Patience, MD  VELTASSA 16.8 g PACK Take by mouth. 12/07/21   [provider]  vitamin B-12 (CYANOCOBALAMIN) 1000 MCG tablet TAKE 1 TABLET BY MOUTH DAILY 10/11/21   Reubin Milan, MD    Physical Exam: Vitals:   03/31/23 0911 03/31/23 0914 03/31/23 1015  BP:  (!) 75/35 (!) 81/69  Pulse:   66  Resp:   (!) 26  SpO2:   100%  Weight: 55 kg    Height: 5' (1.524 m)      Constitutional: NAD, calm, comfortable Vitals:   03/31/23 0911 03/31/23 0914 03/31/23 1015  BP:  (!) 75/35 (!) 81/69  Pulse:   66  Resp:   (!) 26  SpO2:   100%  Weight: 55 kg    Height: 5' (1.524 m)     Eyes: PERRL, lids and conjunctivae normal ENMT: Mucous membranes are dry. Posterior pharynx clear of any exudate or lesions.Normal dentition.  Neck: normal, supple, no masses, no thyromegaly Respiratory: clear to auscultation bilaterally, no wheezing, no crackles. Normal respiratory effort. No accessory muscle use.  Cardiovascular: Regular rate and rhythm, no murmurs / rubs / gallops. No extremity edema. 2+  pedal pulses. No carotid bruits.  Abdomen: no tenderness, no masses palpated. No hepatosplenomegaly. Bowel sounds positive.  Musculoskeletal: no clubbing / cyanosis. No joint deformity upper and lower extremities. Good ROM, no contractures. Normal muscle tone.  Skin: Laceration and bruising of left inner ankle with large hematoma laceration oozing blood from the edge of dressing Neurologic: CN 2-12 grossly intact. Sensation intact, DTR normal. Strength 5/5 in all 4.  Psychiatric: Normal judgment and insight. Alert and oriented x 3. Normal mood.    Labs on Admission: I have personally reviewed following labs and imaging studies  CBC: Recent Labs  Lab 03/31/23 0933  WBC 10.1  NEUTROABS 6.5  HGB  8.9*  HCT 28.9*  MCV 89.8  PLT 646*   Basic Metabolic Panel: Recent Labs  Lab 03/31/23 0933  NA 137  K 4.5  CL 102  CO2 24  GLUCOSE 98  BUN 25*  CREATININE 1.09*  CALCIUM 9.0   GFR: Estimated Creatinine Clearance: 24.6 mL/min (A) (by C-G formula based on SCr of 1.09 mg/dL (H)). Liver Function Tests: Recent Labs  Lab 03/31/23 0933  AST 18  ALT 12  ALKPHOS 129*  BILITOT 0.8  PROT 6.7  ALBUMIN 3.7   No results for input(s): "LIPASE", "AMYLASE" in the last 168 hours. No results for input(s): "AMMONIA" in the last 168 hours. Coagulation Profile: Recent Labs  Lab 03/31/23 0933  INR 1.0   Cardiac Enzymes: No results for input(s): "CKTOTAL", "CKMB", "CKMBINDEX", "TROPONINI" in the last 168 hours. BNP (last 3 results) No results for input(s): "PROBNP" in the last 8760 hours. HbA1C: No results for input(s): "HGBA1C" in the last 72 hours. CBG: No results for input(s): "GLUCAP" in the last 168 hours. Lipid Profile: No results for input(s): "CHOL", "HDL", "LDLCALC", "TRIG", "CHOLHDL", "LDLDIRECT" in the last 72 hours. Thyroid Function Tests: No results for input(s): "TSH", "T4TOTAL", "FREET4", "T3FREE", "THYROIDAB" in the last 72 hours. Anemia Panel: No results for  input(s): "VITAMINB12", "FOLATE", "FERRITIN", "TIBC", "IRON", "RETICCTPCT" in the last 72 hours. Urine analysis:    Component Value Date/Time   COLORURINE AMBER (A) 05/17/2022 1446   APPEARANCEUR CLOUDY (A) 05/17/2022 1446   LABSPEC 1.021 05/17/2022 1446   PHURINE 5.0 05/17/2022 1446   GLUCOSEU NEGATIVE 05/17/2022 1446   HGBUR NEGATIVE 05/17/2022 1446   BILIRUBINUR NEGATIVE 05/17/2022 1446   BILIRUBINUR neg 03/30/2021 1430   KETONESUR 5 (A) 05/17/2022 1446   PROTEINUR 30 (A) 05/17/2022 1446   UROBILINOGEN 0.2 03/30/2021 1430   NITRITE NEGATIVE 05/17/2022 1446   LEUKOCYTESUR SMALL (A) 05/17/2022 1446    Radiological Exams on Admission: DG Ankle Complete Left Result Date: 03/31/2023 CLINICAL DATA:  Left ankle wound with bleeding. EXAM: LEFT ANKLE COMPLETE - 3+ VIEW COMPARISON:  None Available. FINDINGS: There is no evidence of fracture, dislocation, or joint effusion. There is no evidence of arthropathy or other focal bone abnormality. Large dressing is seen medially in ankle region. IMPRESSION: No definite bony abnormality seen. Electronically Signed   By: Lupita Raider M.D.   On: 03/31/2023 10:21    EKG: Pending  Assessment/Plan Principal Problem:   Hematoma Active Problems:   Hematoma of left ankle  (please populate well all problems here in Problem List. (For example, if patient is on BP meds at home and you resume or decide to hold them, it is a problem that needs to be her. Same for CAD, COPD, HLD and so on)  Left ankle hematoma -Clinically suspect there was an unwitnessed fall -ED physician tried manually extract blood clots and hematoma you did some blood clots.  Appears to still have some oozing of blood from the wound, vascular surgery was consulted. -H&H this afternoon and evening, transfuse for hemodynamic instability.  Currently, suspect patient's borderline low blood pressure more from dehydration than acute blood loss anemia. -Resume aspirin tomorrow, once H&H  stabilized -Pain control with IV Dilaudid -Start PT OT evaluation tomorrow once cleared by vascular surgery -Check orthostatic vital signs  Acute on chronic iron deficiency anemia -Secondary to acute blood loss from skin laceration and left ankle hematoma -Recheck iron study and reticulocyte count  Polycythemia vera Chronic myelofibrosis -Continue current chemotherapy Jakafi  Chronic ambulation impairment -  Family reported patient has been using roller walker for the last 2 years with home PT.  However about 6 months ago, home PT was suspended due to a slow healing wound after left calf skin biopsy.  Since then the patient ambulation has slowly deteriorated, unsteady at baseline, had first fall 3 weeks ago and came to ED.  Patient was sent from ED to SNF/memory care unit for PT since.   DVT prophylaxis: SCD Code Status: DNR Family Communication: Daughter at bedside Disposition Plan: Expect less than 2 midnight hospital stay Consults called: Vascular surgery Admission status: Telemetry obs   Emeline General MD [facility] Pager [phone]  03/31/2023, 12:14 PM

## 2023-03-31 NOTE — ED Provider Notes (Signed)
[facility] Provider Note    Event Date/Time   First MD Initiated Contact with Patient 03/31/23 [phone]     (approximate)   History   Extremity Laceration   HPI  Jeanita Brignola is a 89 y.o. female with dementia who comes in from home Place with a wound on the left ankle.  Staff found her with bleeding noted from the left ankle with hematoma there.  They were unclear of any injuries.  Patient herself denies any known falls.  She does report pain to the ankle from the hematoma.   Physical Exam   Triage Vital Signs: ED Triage Vitals  Encounter Vitals Group     BP 03/31/23 0914 (!) 75/35     Systolic BP Percentile --      Diastolic BP Percentile --      Pulse --      Resp --      Temp --      Temp src --      SpO2 --      Weight 03/31/23 0911 121 lb 4.1 oz (55 kg)     Height 03/31/23 0911 5' (1.524 m)     Head Circumference --      Peak Flow --      Pain Score 03/31/23 0910 0     Pain Loc --      Pain Education --      Exclude from Growth Chart --     Most recent vital signs: Vitals:   03/31/23 0914  BP: (!) 75/35     General: Awake, no distress.  CV:  Good peripheral perfusion.  Resp:  Normal effort.  Abd:  No distention.  Other:  Patient has good distal pulse DP but obvious hematoma noted to the ankle see media tab. No hematomas noted to the head  ED Results / Procedures / Treatments   Labs (all labs ordered are listed, but only abnormal results are displayed) Labs Reviewed  CBC WITH DIFFERENTIAL/PLATELET - Abnormal; Notable for the following components:      Result Value   RBC 3.22 (*)    Hemoglobin 8.9 (*)    HCT 28.9 (*)    RDW 24.8 (*)    Platelets 646 (*)    nRBC 0.3 (*)    Basophils Absolute 0.2 (*)    Abs Immature Granulocytes 0.53 (*)    All other components within normal limits  COMPREHENSIVE METABOLIC PANEL - Abnormal; Notable for the following components:   BUN 25 (*)    Creatinine, Ser 1.09 (*)    Alkaline  Phosphatase 129 (*)    GFR, Estimated 47 (*)    All other components within normal limits  PROTIME-INR  APTT  HEMOGLOBIN AND HEMATOCRIT, BLOOD  HEMOGLOBIN AND HEMATOCRIT, BLOOD  LACTIC ACID, PLASMA  LACTIC ACID, PLASMA  IRON AND TIBC  FERRITIN  RETICULOCYTES  TYPE AND SCREEN    RADIOLOGY I have reviewed the xray personally and  interpreted and no signs of any fractures  PROCEDURES:  Critical Care performed: No  Procedures   MEDICATIONS ORDERED IN ED: Medications  sodium chloride 0.9 % bolus 1,000 mL (0 mLs Intravenous Stopped 03/31/23 1114)  Tdap (BOOSTRIX) injection 0.5 mL (0.5 mLs Intramuscular Given 03/31/23 1024)     IMPRESSION / MDM / ASSESSMENT AND PLAN / ED COURSE  I reviewed the triage vital signs and the nursing notes.   Patient's presentation is most consistent with acute presentation with potential threat to life or  bodily function.   Patient comes in with significant hematoma noted to the left ankle.  I did call the facility and they deny any known falls.  She is got no hematoma of her head and no neck tenderness she denies any headaches or confusions.  I did discuss with patient's daughter is POA on pros and cons of CT imaging but at this time she is asymptomatic and no fall and unlikely to change management so opted to hold off given no signs of trauma here.  My suspicion is that she probably bumped her ankle and caused a hematoma and that because of her frail skin this opened up or she had a varicose vein.  I attempted to remove some of the clotted to get some of the clot removed and placed a compressive bandage on it.  I discussed with Dr. Gilda Crease who come down and see the patient and potentially do an Radio broadcast assistant.  Given she is hypotensive she responded with 1 L of fluid but she will need serial H&H's to see if she will need a blood transfusion I will discuss with hospital team for admission.   FINAL CLINICAL IMPRESSION(S) / ED DIAGNOSES   Final diagnoses:   Hematoma  Hypotensive episode     Rx / DC Orders   ED Discharge Orders     None        Note:  This document was prepared using Dragon voice recognition software and may include unintentional dictation errors.   Concha Se, MD 03/31/23 [phone]

## 2023-03-31 NOTE — ED Notes (Signed)
Pt resting quietly with eyes closed. Respirations even and non labored. CB remains within reach. Cardiac monitoring in placed. Bed alarm remains on

## 2023-03-31 NOTE — ED Triage Notes (Addendum)
Pt presents to ED via EMS with c/o of L ankle bleeding, unknown reason, pt takes ASA. Pt has dementia at baseline.  See first nurse note.   Pt hypotensive in triage after multiple BP attempts. Pt has large bleeding hematoma to L medial ankle. Pressure bandage applied.

## 2023-03-31 NOTE — ED Triage Notes (Addendum)
Pt comes via EMs from [facility] [place] with c/o wound on leg. Pt had bandage changed and left. Staff then noticed hematoma under skin and swelling. Pt now has bleeding present once arrived to ED waiting with pool of blood under foot. Pt taken to triage at this time.    Pt is at baseline with dementia.

## 2023-04-01 ENCOUNTER — Ambulatory Visit: Payer: PRIVATE HEALTH INSURANCE

## 2023-04-01 ENCOUNTER — Ambulatory Visit: Payer: PRIVATE HEALTH INSURANCE | Admitting: Oncology

## 2023-04-01 DIAGNOSIS — Z79899 Other long term (current) drug therapy: Secondary | ICD-10-CM

## 2023-04-01 DIAGNOSIS — I70222 Atherosclerosis of native arteries of extremities with rest pain, left leg: Secondary | ICD-10-CM | POA: Diagnosis not present

## 2023-04-01 DIAGNOSIS — I2510 Atherosclerotic heart disease of native coronary artery without angina pectoris: Secondary | ICD-10-CM | POA: Diagnosis present

## 2023-04-01 DIAGNOSIS — I70209 Unspecified atherosclerosis of native arteries of extremities, unspecified extremity: Secondary | ICD-10-CM | POA: Diagnosis not present

## 2023-04-01 DIAGNOSIS — D62 Acute posthemorrhagic anemia: Secondary | ICD-10-CM | POA: Diagnosis not present

## 2023-04-01 DIAGNOSIS — E785 Hyperlipidemia, unspecified: Secondary | ICD-10-CM | POA: Diagnosis present

## 2023-04-01 DIAGNOSIS — F0390 Unspecified dementia without behavioral disturbance: Secondary | ICD-10-CM | POA: Diagnosis not present

## 2023-04-01 DIAGNOSIS — R531 Weakness: Secondary | ICD-10-CM | POA: Diagnosis not present

## 2023-04-01 DIAGNOSIS — S8012XA Contusion of left lower leg, initial encounter: Secondary | ICD-10-CM | POA: Diagnosis not present

## 2023-04-01 DIAGNOSIS — D649 Anemia, unspecified: Secondary | ICD-10-CM | POA: Diagnosis not present

## 2023-04-01 DIAGNOSIS — N179 Acute kidney failure, unspecified: Secondary | ICD-10-CM | POA: Diagnosis present

## 2023-04-01 DIAGNOSIS — T148XXA Other injury of unspecified body region, initial encounter: Secondary | ICD-10-CM | POA: Diagnosis not present

## 2023-04-01 DIAGNOSIS — I959 Hypotension, unspecified: Secondary | ICD-10-CM | POA: Diagnosis present

## 2023-04-01 DIAGNOSIS — E860 Dehydration: Secondary | ICD-10-CM | POA: Diagnosis present

## 2023-04-01 DIAGNOSIS — Z8249 Family history of ischemic heart disease and other diseases of the circulatory system: Secondary | ICD-10-CM | POA: Diagnosis not present

## 2023-04-01 DIAGNOSIS — D7581 Myelofibrosis: Secondary | ICD-10-CM | POA: Diagnosis present

## 2023-04-01 DIAGNOSIS — Z23 Encounter for immunization: Secondary | ICD-10-CM | POA: Diagnosis present

## 2023-04-01 DIAGNOSIS — Z79890 Hormone replacement therapy (postmenopausal): Secondary | ICD-10-CM | POA: Diagnosis not present

## 2023-04-01 DIAGNOSIS — Z9181 History of falling: Secondary | ICD-10-CM | POA: Diagnosis not present

## 2023-04-01 DIAGNOSIS — Z7982 Long term (current) use of aspirin: Secondary | ICD-10-CM | POA: Diagnosis not present

## 2023-04-01 DIAGNOSIS — Z888 Allergy status to other drugs, medicaments and biological substances status: Secondary | ICD-10-CM | POA: Diagnosis not present

## 2023-04-01 DIAGNOSIS — Z882 Allergy status to sulfonamides status: Secondary | ICD-10-CM | POA: Diagnosis not present

## 2023-04-01 DIAGNOSIS — S81802A Unspecified open wound, left lower leg, initial encounter: Secondary | ICD-10-CM | POA: Diagnosis not present

## 2023-04-01 DIAGNOSIS — S9002XA Contusion of left ankle, initial encounter: Secondary | ICD-10-CM | POA: Diagnosis present

## 2023-04-01 DIAGNOSIS — L03116 Cellulitis of left lower limb: Secondary | ICD-10-CM | POA: Diagnosis not present

## 2023-04-01 DIAGNOSIS — Z90710 Acquired absence of both cervix and uterus: Secondary | ICD-10-CM | POA: Diagnosis not present

## 2023-04-01 DIAGNOSIS — S91012A Laceration without foreign body, left ankle, initial encounter: Secondary | ICD-10-CM | POA: Diagnosis present

## 2023-04-01 DIAGNOSIS — D509 Iron deficiency anemia, unspecified: Secondary | ICD-10-CM | POA: Diagnosis present

## 2023-04-01 DIAGNOSIS — D751 Secondary polycythemia: Secondary | ICD-10-CM | POA: Diagnosis not present

## 2023-04-01 DIAGNOSIS — Z66 Do not resuscitate: Secondary | ICD-10-CM | POA: Diagnosis present

## 2023-04-01 DIAGNOSIS — I70243 Atherosclerosis of native arteries of left leg with ulceration of ankle: Secondary | ICD-10-CM | POA: Diagnosis not present

## 2023-04-01 DIAGNOSIS — D45 Polycythemia vera: Secondary | ICD-10-CM | POA: Diagnosis present

## 2023-04-01 DIAGNOSIS — Z85828 Personal history of other malignant neoplasm of skin: Secondary | ICD-10-CM | POA: Diagnosis not present

## 2023-04-01 DIAGNOSIS — J449 Chronic obstructive pulmonary disease, unspecified: Secondary | ICD-10-CM | POA: Diagnosis present

## 2023-04-01 DIAGNOSIS — R0902 Hypoxemia: Secondary | ICD-10-CM | POA: Diagnosis present

## 2023-04-01 LAB — CBC
HCT: 22.1 % — ABNORMAL LOW (ref 36.0–46.0)
Hemoglobin: 7 g/dL — ABNORMAL LOW (ref 12.0–15.0)
MCH: 27.8 pg (ref 26.0–34.0)
MCHC: 31.7 g/dL (ref 30.0–36.0)
MCV: 87.7 fL (ref 80.0–100.0)
Platelets: 495 10*3/uL — ABNORMAL HIGH (ref 150–400)
RBC: 2.52 MIL/uL — ABNORMAL LOW (ref 3.87–5.11)
RDW: 25.3 % — ABNORMAL HIGH (ref 11.5–15.5)
WBC: 10.4 10*3/uL (ref 4.0–10.5)
nRBC: 0.3 % — ABNORMAL HIGH (ref 0.0–0.2)

## 2023-04-01 LAB — BASIC METABOLIC PANEL
Anion gap: 7 (ref 5–15)
BUN: 25 mg/dL — ABNORMAL HIGH (ref 8–23)
CO2: 25 mmol/L (ref 22–32)
Calcium: 8.8 mg/dL — ABNORMAL LOW (ref 8.9–10.3)
Chloride: 104 mmol/L (ref 98–111)
Creatinine, Ser: 1.02 mg/dL — ABNORMAL HIGH (ref 0.44–1.00)
GFR, Estimated: 51 mL/min — ABNORMAL LOW (ref >60)
Glucose, Bld: 91 mg/dL (ref 70–99)
Potassium: 4.1 mmol/L (ref 3.5–5.1)
Sodium: 136 mmol/L (ref 135–145)

## 2023-04-01 LAB — PHOSPHORUS: Phosphorus: 3.1 mg/dL (ref 2.5–4.6)

## 2023-04-01 LAB — MRSA NEXT GEN BY PCR, NASAL: MRSA by PCR Next Gen: DETECTED — AB

## 2023-04-01 LAB — PREPARE RBC (CROSSMATCH)

## 2023-04-01 LAB — MAGNESIUM: Magnesium: 2 mg/dL (ref 1.7–2.4)

## 2023-04-01 LAB — VITAMIN B12: Vitamin B-12: 518 pg/mL (ref 180–914)

## 2023-04-01 LAB — VITAMIN D 25 HYDROXY (VIT D DEFICIENCY, FRACTURES): Vit D, 25-Hydroxy: 39.37 ng/mL (ref 30–100)

## 2023-04-01 MED ORDER — LEVOTHYROXINE SODIUM 25 MCG PO TABS
25.0000 ug | ORAL_TABLET | Freq: Every day | ORAL | Status: DC
  Administered 2023-04-02 – 2023-04-10 (×8): 25 ug via ORAL
  Filled 2023-04-01 (×7): qty 1

## 2023-04-01 MED ORDER — HYDRALAZINE HCL 20 MG/ML IJ SOLN: 10.0000 mg | Freq: Four times a day (QID) | INTRAMUSCULAR | Status: DC | PRN

## 2023-04-01 MED ORDER — SODIUM CHLORIDE 0.9% IV SOLUTION
Freq: Once | INTRAVENOUS | Status: AC
Start: 2023-04-01 — End: 2023-04-01
  Filled 2023-04-01: qty 250

## 2023-04-01 NOTE — ED Notes (Signed)
Patient had oxygen pulled off oxygen saturation was 87%. RN went into room introduced self to patient and re-applied oxygen at 2L Laura Wilcox. Paitnets oxygen saturation at 98% RR:20

## 2023-04-01 NOTE — ED Notes (Signed)
Oxygen saturation 83%. This RN to bedside. Explained to patient importance of wearing oxygen and that her numbers were low and the doctor wanted her to wear it. Pt agreeable at this time. O2 2L [place] applied. Pt denies any needs. CB remains within reach

## 2023-04-01 NOTE — ED Notes (Signed)
Paper blood consent signed on paper in chart.

## 2023-04-01 NOTE — Evaluation (Signed)
 Occupational Therapy Evaluation Patient Details Name: Laura Wilcox MRN: [id number] DOB: 11-13-1926 Today's Date: 04/01/2023   History of Present Illness Pt. is a 89 y.o.  female who was admitted with a large left ankle  hematoma, and laceration sustained during a fall at  her ALF. PMHx includes: Advanced Dementia, Polycythemia Vera, Chronic Myelofibrosis on Chemotherapy, chronic ambulation impairment,   Clinical Impression   Pt. presents with a history of Advanced Dementia with cognitive impairment, weakness, limited activity tolerance, and limited functional mobility which hinders her ability to complete basic ADL and IADL functioning.  Pt. resides at [facility] [place] in the Memory Care unit. Pt. Reports being independent with morning daily care, and using a 2 wheeled RW within the facility. Pt. Reports that she enjoys playing Bridge, however has not played since [place] . Pt. Participated in a limited bedside, and UE strength assessment. Pt. refused OOB ADL/Mobility/portion of  the assessment when attempting Co-tx with PT.  Pt. Was initially pleasant, however became paranoid, with agitation reporting that she does not want any therapy, ever, ultimately refusing to continue. Pt. does require increased cues for redirection, and gentle reassurance. Pt. Would benefit from a trial of OT services for ADL training, and pt./caregiver education about cognitive compensatory strategies, positioning, and DME.        If plan is discharge home, recommend the following:  (TBD)    Functional Status Assessment  Patient has had a recent decline in their functional status and/or demonstrates limited ability to make significant improvements in function in a reasonable and predictable amount of time  Equipment Recommendations       Recommendations for Other Services       Precautions / Restrictions Precautions Precautions: Fall Restrictions Weight Bearing Restrictions Per Provider Order: No       Mobility Bed Mobility               General bed mobility comments: Pt. able to reposition her LE in bed    Transfers                   General transfer comment: Pt. refused      Balance                                           ADL either performed or assessed with clinical judgement   ADL                                         General ADL Comments: TBA     Vision Baseline Vision/History: 1 Wears glasses (Reports using reading glasses) Patient Visual Report: No change from baseline       Perception         Praxis         Pertinent Vitals/Pain Pain Assessment Pain Assessment: No/denies pain     Extremity/Trunk Assessment Upper Extremity Assessment Upper Extremity Assessment: Generalized weakness           Communication Communication Communication: Hearing impairment Cueing Techniques: Verbal cues;Gestural cues   Cognition Arousal: Alert Behavior During Therapy:  (WFL initially, became paranoid with agitation)  General Comments       Exercises     Shoulder Instructions      Home Living Family/patient expects to be discharged to:: Assisted living [facility] [place]; Memory care unit)                             Home Equipment: Agricultural Consultant (2 wheels)   Additional Comments: has a daughter that lives in the area      Prior Functioning/Environment Prior Level of Function : Needs assist  Cognitive Assist : Mobility (cognitive)     Physical Assist : Mobility (physical);ADLs (physical)     Mobility Comments: Reports using a 2-wheeled  RW ADLs Comments: Supervision for bathing. Pt. reports independence with dressing,        OT Problem List: Decreased strength;Decreased cognition      OT Treatment/Interventions: Self-care/ADL training;Cognitive remediation/compensation    OT Goals(Current goals can be  found in the care plan section) Acute Rehab OT Goals OT Goal Formulation: Patient unable to participate in goal setting  OT Frequency: Min 1X/week    Co-evaluation   Reason for Co-Treatment: Complexity of the patient's impairments (multi-system involvement);For patient/therapist safety;To address functional/ADL transfers          AM-PAC OT 6 Clicks Daily Activity     Outcome Measure Help from another person eating meals?: A Little Help from another person taking care of personal grooming?: A Lot Help from another person toileting, which includes using toliet, bedpan, or urinal?: A Lot Help from another person bathing (including washing, rinsing, drying)?: A Lot Help from another person to put on and taking off regular upper body clothing?: A Lot Help from another person to put on and taking off regular lower body clothing?: A Lot 6 Click Score: 13   End of Session    Activity Tolerance:  (Limited by cogniiton) Patient left: in bed;with call bell/phone within reach;with bed alarm set  OT Visit Diagnosis: Unsteadiness on feet (R26.81);History of falling (Z91.81);Muscle weakness (generalized) (M62.81)                Time: 8679-8654 OT Time Calculation (min): 25 min Charges:  OT General Charges $OT Visit: 1 Visit OT Evaluation $OT Eval Low Complexity: 1 Low  Richardson Otter, MS, OTR/L   Richardson Otter 04/01/2023, 3:39 PM

## 2023-04-01 NOTE — ED Notes (Signed)
 Surgeons were at bedside. Pt should get surgical debridement to wound but now pt has been eating (was never made NPO). Surgeons will let us know if/when pt going to surgery. It would not be tomorrow because of new years day.

## 2023-04-01 NOTE — Progress Notes (Signed)
 PT Cancellation Note  Patient Details Name: Laura Wilcox MRN: [id number] DOB: [birth year]   Cancelled Treatment:    Reason Eval/Treat Not Completed: Patient declined, no reason specified  PT/OT attempted co-evaluation. However during history intake, patient became agitated and confused. She initially stated, I don't want anything that you're selling and then as therapist explained the purpose of being there, patient refused stating, I don't want any therapy.  Will re-attempt when patient is agreeable and appropriate for therapy  Creg Gilmer PT, DPT 04/01/2023, 4:28 PM

## 2023-04-01 NOTE — Progress Notes (Signed)
 [facility] Progress Note  Patient: Laura Wilcox    FMW:[id number]  DOA: 03/31/2023     Date of Service: the patient was seen and examined on 04/01/2023  Chief Complaint  Patient presents with   Extremity Laceration   Brief hospital course: Laura Wilcox is a 89 y.o. female with PMH of Advanced dementia, polycythemia vera and chronic myelofibrosis on chemotherapy, chronic iron  deficiency anemia, chronic ambulation impairment, was found on the floor with a large left ankle hematoma in the morning on 03/31/23.   Patient does not remember what has happened, and this morning patient was found on the floor awake, with a large left inner ankle hematoma.  Patient does not remember whether she had a fall or hit anything with the left foot last night or this morning.  Family at bedside reported that the patient sustained a unwitnessed fall 3 weeks ago and hit her head with a large hematoma but no LOC.  At baseline, patient has dementia and family reported that the patient prefers to staying  dehydrated by avoiding drinking water so that does not have to change diaper On arrival in the ED, patient was found to be hypotensive blood pressure 75/35, none tachycardia nonhypoxic afebrile.  Blood work showed hemoglobin 8.9 compared to baseline 9.9, platelet 646, creatinine 1.0, BUN 25, glucose 98.   Patient was given 1 L of IV bolus in the ED.  ED physician tried to manually extract hematoma and clots from the left ankle.   Assessment and Plan:  # Left ankle hematoma Patient was found on the floor, significant dementia, poor historian.  Hematoma most likely secondary to unwitnessed trauma and fall. S/p manual extraction of blood clots done by ED physician, left ankle was wrapped with pressure dressing. Discontinued aspirin  Follow vascular surgery for further recommendation  # Acute blood loss anemia due to left ankle hematoma, most likely due to trauma History of iron  deficiency anemia.   Iron  profile within normal range Hb 7.0, transfuse 1 unit of PRBC on 12/31 Monitor H&H and transfuse as needed   # Hypotension, BP was low on arrival, s/p IV fluid Now hypertensive, continue to monitor BP and titrate medication accordingly. Use IV hydralazine   # Polycythemia vera Chronic myelofibrosis -Continue current chemotherapy Jakafi    # Chronic ambulation impairment -Family reported patient has been using roller walker for the last 2 years with home PT.  However about 6 months ago, home PT was suspended due to a slow healing wound after left calf skin biopsy.  Since then the patient ambulation has slowly deteriorated, unsteady at baseline, had first fall 3 weeks ago and came to ED.  Patient was sent from ED to SNF/memory care unit for PT since.   Body mass index is 23.68 kg/m.  Interventions:  Diet: Regular diet DVT Prophylaxis: SCD, pharmacological prophylaxis contraindicated due to hematoma    Advance goals of care discussion: DNR/DNI-limited  Family Communication: family was present at bedside, at the time of interview.  The pt provided permission to discuss medical plan with the family. Opportunity was given to ask question and all questions were answered satisfactorily.   Disposition:  Pt is from memory care unit, admitted with left ankle hematoma due to unwitnessed fall, still has low hemoglobin and risk of fall, which precludes a safe discharge. Discharge to SNF, when stable, may need few days to improve.  Subjective: No significant events overnight, patient was lying comfortably, due to severe dementia unable to offer any complaints.  AO x 1.  Management plan discussed with patient's daughter at bedside.   Physical Exam: General: NAD, lying comfortably Appear in no distress, affect appropriate Eyes: PERRLA ENT: Oral Mucosa Clear, moist  Neck: no JVD,  Cardiovascular: S1 and S2 Present, no Murmur,  Respiratory: good respiratory effort, Bilateral Air entry equal  and Decreased, no Crackles, no wheezes Abdomen: Bowel Sound present, Soft and no tenderness,  Skin: no rashes Extremities: no Pedal edema, no calf tenderness. Left ankle hematoma, dressing CDI Neurologic: without any new focal findings Gait not checked due to patient safety concerns  Vitals:   04/01/23 1100 04/01/23 1130 04/01/23 1200 04/01/23 1400  BP: (!) 142/56 129/66 (!) 147/64 (!) 157/86  Pulse: 75 73 78 86  Resp: (!) 23 17 (!) 23 (!) 24  Temp:      TempSrc:      SpO2: 98% 97% 99% 94%  Weight:      Height:        Intake/Output Summary (Last 24 hours) at 04/01/2023 1441 Last data filed at 04/01/2023 1048 Gross per 24 hour  Intake 1530.44 ml  Output --  Net 1530.44 ml   Filed Weights   03/31/23 0911  Weight: 55 kg    Data Reviewed: I have personally reviewed and interpreted daily labs, tele strips, imagings as discussed above. I reviewed all nursing notes, pharmacy notes, vitals, pertinent old records I have discussed plan of care as described above with RN and patient/family.  CBC: Recent Labs  Lab 03/31/23 0933 03/31/23 1452 03/31/23 2135 04/01/23 0423  WBC 10.1  --   --  10.4  NEUTROABS 6.5  --   --   --   HGB 8.9* 7.7* 7.1* 7.0*  HCT 28.9* 24.7* 22.9* 22.1*  MCV 89.8  --   --  87.7  PLT 646*  --   --  495*   Basic Metabolic Panel: Recent Labs  Lab 03/31/23 0933 04/01/23 0831  NA 137 136  K 4.5 4.1  CL 102 104  CO2 24 25  GLUCOSE 98 91  BUN 25* 25*  CREATININE 1.09* 1.02*  CALCIUM  9.0 8.8*  MG  --  2.0  PHOS  --  3.1    Studies: No results found.  Scheduled Meds:  citalopram   20 mg Oral Daily   cyanocobalamin   1,000 mcg Oral Daily   feeding supplement  237 mL Oral BID BM   ferrous sulfate   325 mg Oral Daily   hydrocortisone  cream  1 Application Topical BID   potassium chloride  SA  20 mEq Oral BID   ruxolitinib  phosphate  10 mg Oral Daily   ruxolitinib  phosphate  5 mg Oral QHS   Continuous Infusions: PRN Meds: acetaminophen ,  HYDROmorphone  (DILAUDID ) injection, ondansetron  **OR** ondansetron  (ZOFRAN ) IV, polyethylene glycol  Time spent: 55 minutes  Author: ELVAN SOR. MD [facility] 04/01/2023 2:41 PM  To reach On-call, see care teams to locate the attending and reach out to them via [email or web address]. If 7PM-7AM, please contact night-coverage If you still have difficulty reaching the attending provider, please page the [facility] (Director on Call) for [facility] on amion for assistance.

## 2023-04-01 NOTE — ED Notes (Signed)
Pt hearing aid (1) returned to daughter who put in her purse.

## 2023-04-01 NOTE — ED Notes (Signed)
 Pt awake. Verified brief is dry. PIV access obtained for additional lab work. Pt tolerated well. Pt denies any needs at this time. CB remains within reach. Pt encouraged to alert staff to any needs. Bed alarm remains on. Pt remains within site of nurses station with door open. Pt declines oxygen at this time. States that she does not need it right now. Attempted to educate patient about oxygen saturation pt continues to refuse at this time

## 2023-04-01 NOTE — Consult Note (Signed)
 Hospital Consult    Reason for Consult:  Left Ankle Hematoma Requesting Physician:  Dr Cort Mana MD  MRN #:  [id number]  History of Present Illness: This is a 89 y.o. female with medical history significant of advanced dementia, polycythemia vera and chronic myelofibrosis on chemotherapy, chronic iron  deficiency anemia, chronic ambulation impairment, was found on the floor with a large left ankle hematoma yesterday.   On exam this morning patient is resting comfortably in a stretcher in the emergency room.  Patient's daughter is at her bedside.  Patient is currently getting a unit of packed red blood cells for lower hemoglobin counts.  Dressing was taken down off of her left ankle.  Skin over the hematoma is noted to be nonviable and will need to be debrided.  Patient will need to be taken to the operating room for washout and debridement.  Unfortunately the patient was fed breakfast this morning and was never made n.p.o.  Past Medical History:  Diagnosis Date   Acute metabolic encephalopathy 10/22/2020   Confusion 12/30/2018   Polycythemia    SCC (squamous cell carcinoma) 04/11/2021   R med dorsum hand - ED&C SCCIS   SCC (squamous cell carcinoma) 10/09/2021   left lateral eye, EDC   SCC (squamous cell carcinoma) 01/28/2022   left lateral eye, recurrent, EDC. 5FU/Calcipotriene cream.   SCC (squamous cell carcinoma) 01/28/2022   in situ, left medial cheek, 5FU/Calcipotriene Cr   SCC (squamous cell carcinoma) 02/26/2022   SCC IS, Right Ulnar Dorsal Hand, 5FU/Calcipotriene cream   SCC (squamous cell carcinoma) 09/03/2022   R ankle, EDC   SCC (squamous cell carcinoma) 01/20/2023   SCC IS Left medial lower leg, EDC   Squamous cell carcinoma of skin 09/06/2020   R thumb - ED&C   Urinary tract infection without hematuria 01/02/2020   Urticaria 06/02/2017    Past Surgical History:  Procedure Laterality Date   ABDOMINAL HYSTERECTOMY     complete   BREAST SURGERY      Allergies   Allergen Reactions   Bactrim  [Sulfamethoxazole -Trimethoprim ]     Due to kidney disease    Epinephrine  Other (See Comments)    Prior to Admission medications   Medication Sig Start Date End Date Taking? Authorizing Provider  acetaminophen  (TYLENOL ) 325 MG tablet Take 325-650 mg by mouth every 6 (six) hours as needed for mild pain or moderate pain.   Yes [provider]  ammonium lactate  (AMLACTIN) 12 % cream Apply topically qd/bid to dry areas at lower legs and arms Patient taking differently: Apply 1 Application topically 2 (two) times daily. Apply topically qd/bid to dry areas at lower legs and arms 01/20/23  Yes Jackquline Sawyer, MD  aspirin  EC 81 MG tablet Take 81 mg by mouth daily. Swallow whole.   Yes [provider]  calcitRIOL  (ROCALTROL ) 0.25 MCG capsule Take 0.25 mcg by mouth every Monday, Wednesday, and Friday.   Yes [provider]  Cholecalciferol  (VITAMIN D3) 25 MCG (1000 UT) CAPS Take 1 capsule by mouth daily. 10/22/22  Yes [provider]  citalopram  (CELEXA ) 20 MG tablet Take 1 tablet (20 mg total) by mouth daily. Patient taking differently: Take 10 mg by mouth daily. 09/27/22  Yes Borders, Fonda SAUNDERS, NP  hydrocortisone  2.5 % cream Apply topically twice daily to aa of right cheek as needed for itch 01/20/23  Yes Jackquline Sawyer, MD  Iron -Vitamin C  65-125 MG TABS Take 1 tablet by mouth daily. 12/31/22  Yes Babara Call, MD  levothyroxine  (SYNTHROID )  25 MCG tablet Take 25 mcg by mouth daily. 03/18/23  Yes [provider]  Multiple Vitamin (MULTIVITAMIN WITH MINERALS) TABS tablet Take 1 tablet by mouth daily.   Yes [provider]  ruxolitinib  phosphate (JAKAFI ) 5 MG tablet Take 1 tablet (5 mg total) by mouth See admin instructions. Jakafi  10 mg in the morning, 5 mg in the evening. 02/24/23  Yes Babara Call, MD  feeding supplement (ENSURE ENLIVE / ENSURE PLUS) LIQD Take 237 mLs by mouth 2 (two) times daily between meals. 07/02/21   Amin,  Sumayya, MD  mupirocin  ointment (BACTROBAN ) 2 % Apply topically to wound at right jawline qd and cover with bandage until healed. Can use at any other crusted areas on legs and open wounds. Patient not taking: Reported on 03/31/2023 01/20/23   Jackquline Sawyer, MD    Social History   Socioeconomic History   Marital status: Widowed    Spouse name: Not on file   Number of children: Not on file   Years of education: Not on file   Highest education level: Not on file  Occupational History   Not on file  Tobacco Use   Smoking status: Never   Smokeless tobacco: Never  Vaping Use   Vaping status: Never Used  Substance and Sexual Activity   Alcohol  use: No    Comment: occasional glass of wine   Drug use: No   Sexual activity: Not on file  Other Topics Concern   Not on file  Social History Narrative   ** Merged History Encounter **       Social Drivers of Health   Financial Resource Strain: Not on file  Food Insecurity: Not on file  Transportation Needs: Not on file  Physical Activity: Not on file  Stress: Not on file  Social Connections: Not on file  Intimate Partner Violence: Not on file     Family History  Problem Relation Age of Onset   Heart disease Father    Cancer Sister        breast   Cancer Sister        breast   Alzheimer's disease Sister     ROS: Otherwise negative unless mentioned in HPI  Physical Examination  Vitals:   04/01/23 0600 04/01/23 0730  BP:    Pulse: 74 70  Resp: 16 18  Temp:    SpO2: 99% 97%   Body mass index is 23.68 kg/m.  General:  WDWN in NAD Gait: Not observed HENT: WNL, normocephalic Pulmonary: normal non-labored breathing, without Rales, rhonchi,  wheezing Cardiac: regular, without  Murmurs, rubs or gallops; without carotid bruits Abdomen: Positive bowel sounds, soft, NT/ND, no masses Skin: without rashes Vascular Exam/Pulses: Palpable pulses throughout, All extremities are warm to the touch. Extremities: without  ischemic changes, without Gangrene , without cellulitis; with open wounds; Left ankle hematoma that has been evacuated by the ER Musculoskeletal: no muscle wasting or atrophy  Neurologic: A&O X 3;  No focal weakness or paresthesias are detected; speech is fluent/normal Psychiatric:  The pt has Abnormal- Hx of Dementia  affect. Lymph:  Unremarkable  CBC    Component Value Date/Time   WBC 10.4 04/01/2023 0423   RBC 2.52 (L) 04/01/2023 0423   HGB 7.0 (L) 04/01/2023 0423   HGB 10.3 (L) 02/25/2023 1347   HGB 8.1 (L) 06/18/2021 1514   HCT 22.1 (L) 04/01/2023 0423   HCT 25.2 (L) 06/18/2021 1514   PLT 495 (H) 04/01/2023 0423   PLT 677 (H)  02/25/2023 1347   PLT 452 (H) 06/18/2021 1514   MCV 87.7 04/01/2023 0423   MCV 91 06/18/2021 1514   MCH 27.8 04/01/2023 0423   MCHC 31.7 04/01/2023 0423   RDW 25.3 (H) 04/01/2023 0423   RDW 20.1 (H) 06/18/2021 1514   LYMPHSABS 1.5 03/31/2023 0933   LYMPHSABS 1.0 06/18/2021 1514   MONOABS 1.0 03/31/2023 0933   EOSABS 0.4 03/31/2023 0933   EOSABS 0.4 06/18/2021 1514   BASOSABS 0.2 (H) 03/31/2023 0933   BASOSABS 0.3 (H) 06/18/2021 1514    BMET    Component Value Date/Time   NA 137 03/31/2023 0933   NA 134 06/18/2021 1514   K 4.5 03/31/2023 0933   CL 102 03/31/2023 0933   CO2 24 03/31/2023 0933   GLUCOSE 98 03/31/2023 0933   BUN 25 (H) 03/31/2023 0933   BUN 19 06/18/2021 1514   CREATININE 1.09 (H) 03/31/2023 0933   CREATININE 1.41 (H) 12/30/2022 1118   CALCIUM  9.0 03/31/2023 0933   GFRNONAA 47 (L) 03/31/2023 0933   GFRNONAA 35 (L) 12/30/2022 1118   GFRAA 49 (L) 12/27/2019 0935    COAGS: Lab Results  Component Value Date   INR 1.0 03/31/2023   INR 1.1 10/22/2020   INR 0.92 12/03/2017     Non-Invasive Vascular Imaging:   None Ordered  Statin:  No. Beta Blocker:  No. Aspirin :  Yes.   ACEI:  No. ARB:  No. CCB use:  No Other antiplatelets/anticoagulants:  No.    ASSESSMENT/PLAN: This is a 89 y.o. female who was found on the  floor in her living facility with a large left ankle hematoma.  Patient was transported to [facility] emergency department for further evaluation.  In the emergency department left ankle hematoma was evacuated.  Dressing was placed.  PLAN: Vascular surgery has seen the patient and change the dressing this morning.  The overlying skin to the hematoma is nonviable at this point.  It will need to be debrided.  Rest of the wound bed will need to be washed out and the rest of the hematoma removed.  Did the patient's pain at the bedside that was unable to be done this morning therefore the patient will need to go back to the operating room for this with some sedation.  Unfortunately the patient was fed breakfast this morning therefore we will have to schedule accordingly with the operating staff due to the holiday.  In the interim daily dressing changes need to be done to this hematoma.  Dressing changes would consist of Vaseline impregnated gauze over the open area, covered by dry gauze wrapped in Kerlix and wrapped in Coban as a compression dressing.  Do not make the dressing too tight in order to decrease circulation to her foot.  We will schedule accordingly for wound washout and debridement as soon as we are able.   I discussed the plan in detail with Dr. Cordella Shawl MD and he agrees with the plan.   Gwendlyn JONELLE Shank [facility] 04/01/2023 8:39 AM

## 2023-04-02 ENCOUNTER — Inpatient Hospital Stay: Payer: Medicare Other | Admitting: Registered Nurse

## 2023-04-02 ENCOUNTER — Encounter: Payer: Self-pay | Admitting: Student

## 2023-04-02 ENCOUNTER — Other Ambulatory Visit: Payer: Self-pay

## 2023-04-02 ENCOUNTER — Encounter
Admission: RE | Disposition: A | Discharge status: Home Health | Payer: Self-pay | Source: Skilled Nursing Facility | Attending: Student

## 2023-04-02 DIAGNOSIS — T148XXA Other injury of unspecified body region, initial encounter: Secondary | ICD-10-CM | POA: Diagnosis not present

## 2023-04-02 DIAGNOSIS — S8012XA Contusion of left lower leg, initial encounter: Secondary | ICD-10-CM | POA: Diagnosis not present

## 2023-04-02 HISTORY — PX: HEMATOMA EVACUATION: SHX5118

## 2023-04-02 LAB — CBC
HCT: 26.3 % — ABNORMAL LOW (ref 36.0–46.0)
Hemoglobin: 8.5 g/dL — ABNORMAL LOW (ref 12.0–15.0)
MCH: 25.7 pg — ABNORMAL LOW (ref 26.0–34.0)
MCHC: 32.3 g/dL (ref 30.0–36.0)
MCV: 79.5 fL — ABNORMAL LOW (ref 80.0–100.0)
Platelets: 507 10*3/uL — ABNORMAL HIGH (ref 150–400)
RBC: 3.31 MIL/uL — ABNORMAL LOW (ref 3.87–5.11)
RDW: 30.2 % — ABNORMAL HIGH (ref 11.5–15.5)
WBC: 10.2 10*3/uL (ref 4.0–10.5)
nRBC: 0.5 % — ABNORMAL HIGH (ref 0.0–0.2)

## 2023-04-02 LAB — BASIC METABOLIC PANEL
Anion gap: 10 (ref 5–15)
BUN: 22 mg/dL (ref 8–23)
CO2: 22 mmol/L (ref 22–32)
Calcium: 9 mg/dL (ref 8.9–10.3)
Chloride: 104 mmol/L (ref 98–111)
Creatinine, Ser: 1.06 mg/dL — ABNORMAL HIGH (ref 0.44–1.00)
GFR, Estimated: 49 mL/min — ABNORMAL LOW (ref >60)
Glucose, Bld: 96 mg/dL (ref 70–99)
Potassium: 4.3 mmol/L (ref 3.5–5.1)
Sodium: 136 mmol/L (ref 135–145)

## 2023-04-02 LAB — TYPE AND SCREEN
ABO/RH(D): O POS
Antibody Screen: NEGATIVE
Unit division: 0

## 2023-04-02 LAB — PHOSPHORUS: Phosphorus: 2.7 mg/dL (ref 2.5–4.6)

## 2023-04-02 LAB — BPAM RBC
Blood Product Expiration Date: 202501072359
ISSUE DATE / TIME: 202412310850
Unit Type and Rh: 9500

## 2023-04-02 LAB — MAGNESIUM: Magnesium: 1.9 mg/dL (ref 1.7–2.4)

## 2023-04-02 SURGERY — EVACUATION HEMATOMA
Anesthesia: General | Site: Ankle | Laterality: Left

## 2023-04-02 MED ORDER — FENTANYL CITRATE (PF) 100 MCG/2ML IJ SOLN
INTRAMUSCULAR | Status: AC
  Filled 2023-04-02: qty 2

## 2023-04-02 MED ORDER — CEFAZOLIN SODIUM 1 G IJ SOLR
INTRAMUSCULAR | Status: AC
  Filled 2023-04-02: qty 20

## 2023-04-02 MED ORDER — PROPOFOL 10 MG/ML IV BOLUS
INTRAVENOUS | Status: DC | PRN
  Administered 2023-04-02: 20 mg via INTRAVENOUS
  Administered 2023-04-02: 30 mg via INTRAVENOUS
  Administered 2023-04-02: 10 mg via INTRAVENOUS

## 2023-04-02 MED ORDER — FENTANYL CITRATE (PF) 100 MCG/2ML IJ SOLN
INTRAMUSCULAR | Status: DC | PRN
  Administered 2023-04-02 (×2): 25 ug via INTRAVENOUS

## 2023-04-02 MED ORDER — PHENYLEPHRINE HCL-NACL 20-0.9 MG/250ML-% IV SOLN
INTRAVENOUS | Status: AC
  Filled 2023-04-02: qty 250

## 2023-04-02 MED ORDER — AMLODIPINE BESYLATE 5 MG PO TABS
5.0000 mg | ORAL_TABLET | Freq: Every day | ORAL | Status: DC
  Administered 2023-04-03 – 2023-04-04 (×2): 5 mg via ORAL
  Filled 2023-04-02 (×2): qty 1

## 2023-04-02 MED ORDER — PHENYLEPHRINE HCL (PRESSORS) 10 MG/ML IV SOLN
INTRAVENOUS | Status: DC | PRN
  Administered 2023-04-02: 80 ug via INTRAVENOUS
  Administered 2023-04-02 (×6): 160 ug via INTRAVENOUS

## 2023-04-02 MED ORDER — 0.9 % SODIUM CHLORIDE (POUR BTL) OPTIME
TOPICAL | Status: DC | PRN
  Administered 2023-04-02: 1000 mL

## 2023-04-02 MED ORDER — PROPOFOL 500 MG/50ML IV EMUL
INTRAVENOUS | Status: DC | PRN
  Administered 2023-04-02: 40 ug/kg/min via INTRAVENOUS

## 2023-04-02 MED ORDER — PROPOFOL 1000 MG/100ML IV EMUL
INTRAVENOUS | Status: AC
  Filled 2023-04-02: qty 100

## 2023-04-02 MED ORDER — CEFAZOLIN SODIUM-DEXTROSE 2-3 GM-%(50ML) IV SOLR
INTRAVENOUS | Status: DC | PRN
  Administered 2023-04-02: 2 g via INTRAVENOUS

## 2023-04-02 MED ORDER — FENTANYL CITRATE (PF) 100 MCG/2ML IJ SOLN
25.0000 ug | INTRAMUSCULAR | Status: DC | PRN
Start: 2023-04-02 — End: 2023-04-02
  Administered 2023-04-02: 25 ug via INTRAVENOUS

## 2023-04-02 MED ORDER — SODIUM CHLORIDE 0.9 % IV BOLUS
250.0000 mL | Freq: Once | INTRAVENOUS | Status: AC
  Administered 2023-04-02: 250 mL via INTRAVENOUS

## 2023-04-02 MED ORDER — DEXMEDETOMIDINE HCL IN NACL 80 MCG/20ML IV SOLN
INTRAVENOUS | Status: AC
  Filled 2023-04-02: qty 20

## 2023-04-02 SURGICAL SUPPLY — 33 items
BNDG COHESIVE 4X5 TAN STRL LF (GAUZE/BANDAGES/DRESSINGS) IMPLANT
BRUSH SCRUB EZ 4% CHG (MISCELLANEOUS) ×1 IMPLANT
CANISTER WOUND CARE 500ML ATS (WOUND CARE) IMPLANT
CHLORAPREP W/TINT 26 (MISCELLANEOUS) ×1 IMPLANT
DRAPE INCISE IOBAN 66X45 STRL (DRAPES) ×1 IMPLANT
DRSG VAC GRANUFOAM MED (GAUZE/BANDAGES/DRESSINGS) IMPLANT
ELECT CAUTERY BLADE 6.4 (BLADE) ×1 IMPLANT
ELECT REM PT RETURN 9FT ADLT (ELECTROSURGICAL) ×1 IMPLANT
ELECTRODE REM PT RTRN 9FT ADLT (ELECTROSURGICAL) ×1 IMPLANT
GLOVE BIO SURGEON STRL SZ7 (GLOVE) ×2 IMPLANT
GOWN STRL REUS W/ TWL LRG LVL3 (GOWN DISPOSABLE) ×2 IMPLANT
GOWN STRL REUS W/TWL 2XL LVL3 (GOWN DISPOSABLE) ×1 IMPLANT
IV NS 1000ML BAXH (IV SOLUTION) ×1 IMPLANT
KIT TURNOVER KIT A (KITS) ×1 IMPLANT
LABEL OR SOLS (LABEL) ×1 IMPLANT
MANIFOLD NEPTUNE II (INSTRUMENTS) ×1 IMPLANT
NS IRRIG 500ML POUR BTL (IV SOLUTION) ×1 IMPLANT
PACK EXTREMITY ARMC (MISCELLANEOUS) ×1 IMPLANT
PAD PREP OB/GYN DISP 24X41 (PERSONAL CARE ITEMS) ×1 IMPLANT
PULSAVAC PLUS IRRIG FAN TIP (DISPOSABLE) IMPLANT
SOL PREP PVP 2OZ (MISCELLANEOUS) ×1 IMPLANT
SOLUTION PREP PVP 2OZ (MISCELLANEOUS) ×1 IMPLANT
SPONGE T-LAP 18X18 ~~LOC~~+RFID (SPONGE) ×1 IMPLANT
STOCKINETTE M/LG 89821 (MISCELLANEOUS) IMPLANT
STOCKINETTE STRL 6IN 960660 (GAUZE/BANDAGES/DRESSINGS) IMPLANT
SUT ETHILON 4-0 FS2 18XMFL BLK (SUTURE) IMPLANT
SUT VIC AB 3-0 SH 27X BRD (SUTURE) IMPLANT
SUTURE ETHLN 4-0 FS2 18XMF BLK (SUTURE) IMPLANT
SWAB CULTURE AMIES ANAERIB BLU (MISCELLANEOUS) ×1 IMPLANT
SYR BULB IRRIG 60ML STRL (SYRINGE) ×1 IMPLANT
TIP FAN IRRIG PULSAVAC PLUS (DISPOSABLE) IMPLANT
TRAP FLUID SMOKE EVACUATOR (MISCELLANEOUS) ×1 IMPLANT
WATER STERILE IRR 500ML POUR (IV SOLUTION) ×1 IMPLANT

## 2023-04-02 NOTE — Progress Notes (Signed)
    Subjective  -   Slightly confused this am  Familyu at bedside   Physical Exam:  Left ankle dressing intact without hematoma       Assessment/Plan:    Plan for I&D under anesthesia.  Family and patient agree to proceed  Laura Wilcox 04/02/2023 9:35 AM --  Vitals:   04/02/23 0914 04/02/23 0915  BP: 108/83 (!) 163/72  Pulse:  90  Resp:    Temp:  (!) 96.9 F (36.1 C)  SpO2:  95%    Intake/Output Summary (Last 24 hours) at 04/02/2023 0935 Last data filed at 04/01/2023 1048 Gross per 24 hour  Intake 1314.33 ml  Output --  Net 1314.33 ml     Laboratory CBC    Component Value Date/Time   WBC 10.2 04/02/2023 0349   HGB 8.5 (L) 04/02/2023 0349   HGB 10.3 (L) 02/25/2023 1347   HGB 8.1 (L) 06/18/2021 1514   HCT 26.3 (L) 04/02/2023 0349   HCT 25.2 (L) 06/18/2021 1514   PLT 507 (H) 04/02/2023 0349   PLT 677 (H) 02/25/2023 1347   PLT 452 (H) 06/18/2021 1514    BMET    Component Value Date/Time   NA 136 04/02/2023 0349   NA 134 06/18/2021 1514   K 4.3 04/02/2023 0349   CL 104 04/02/2023 0349   CO2 22 04/02/2023 0349   GLUCOSE 96 04/02/2023 0349   BUN 22 04/02/2023 0349   BUN 19 06/18/2021 1514   CREATININE 1.06 (H) 04/02/2023 0349   CREATININE 1.41 (H) 12/30/2022 1118   CALCIUM  9.0 04/02/2023 0349   GFRNONAA 49 (L) 04/02/2023 0349   GFRNONAA 35 (L) 12/30/2022 1118   GFRAA 49 (L) 12/27/2019 0935    COAG Lab Results  Component Value Date   INR 1.0 03/31/2023   INR 1.1 10/22/2020   INR 0.92 12/03/2017   No results found for: PTT  Antibiotics Anti-infectives (From admission, onward)    None        V. Malvina Serene CLORE, M.D., [facility] [place] Office: [phone] Pager:  [phone]

## 2023-04-02 NOTE — Plan of Care (Addendum)
 Patient is alert and oriented X 1 with confusion. Elevate her left leg with pillows.  Dressing looks clean, dry and intact. She has been getting up with one assist and walker frequently. Plan of care ongoing.   Problem: Education: Goal: Knowledge of General Education information will improve Description: Including pain rating scale, medication(s)/side effects and non-pharmacologic comfort measures Outcome: Progressing   Problem: Health Behavior/Discharge Planning: Goal: Ability to manage health-related needs will improve Outcome: Progressing   Problem: Clinical Measurements: Goal: Ability to maintain clinical measurements within normal limits will improve Outcome: Progressing Goal: Will remain free from infection Outcome: Progressing Goal: Diagnostic test results will improve Outcome: Progressing Goal: Respiratory complications will improve Outcome: Progressing Goal: Cardiovascular complication will be avoided Outcome: Progressing   Problem: Activity: Goal: Risk for activity intolerance will decrease Outcome: Progressing   Problem: Nutrition: Goal: Adequate nutrition will be maintained Outcome: Progressing   Problem: Coping: Goal: Level of anxiety will decrease Outcome: Progressing   Problem: Elimination: Goal: Will not experience complications related to bowel motility Outcome: Progressing Goal: Will not experience complications related to urinary retention Outcome: Progressing   Problem: Pain Management: Goal: General experience of comfort will improve Outcome: Progressing   Problem: Safety: Goal: Ability to remain free from injury will improve Outcome: Progressing   Problem: Skin Integrity: Goal: Risk for impaired skin integrity will decrease Outcome: Progressing

## 2023-04-02 NOTE — Progress Notes (Signed)
 [facility] Progress Note  Patient: Laura Wilcox    FMW:[id number]  DOA: 03/31/2023     Date of Service: the patient was seen and examined on 04/02/2023  Chief Complaint  Patient presents with   Extremity Laceration   Brief hospital course: Kareen Jefferys is a 89 y.o. female with PMH of Advanced dementia, polycythemia vera and chronic myelofibrosis on chemotherapy, chronic iron  deficiency anemia, chronic ambulation impairment, was found on the floor with a large left ankle hematoma in the morning on 03/31/23.   Patient does not remember what has happened, and this morning patient was found on the floor awake, with a large left inner ankle hematoma.  Patient does not remember whether she had a fall or hit anything with the left foot last night or this morning.  Family at bedside reported that the patient sustained a unwitnessed fall 3 weeks ago and hit her head with a large hematoma but no LOC.  At baseline, patient has dementia and family reported that the patient prefers to staying  dehydrated by avoiding drinking water so that does not have to change diaper On arrival in the ED, patient was found to be hypotensive blood pressure 75/35, none tachycardia nonhypoxic afebrile.  Blood work showed hemoglobin 8.9 compared to baseline 9.9, platelet 646, creatinine 1.0, BUN 25, glucose 98.   Patient was given 1 L of IV bolus in the ED.  ED physician tried to manually extract hematoma and clots from the left ankle.   Assessment and Plan:  # Left ankle hematoma Patient was found on the floor, significant dementia, poor historian.  Hematoma most likely secondary to unwitnessed trauma and fall. S/p manual extraction of blood clots done by ED physician, left ankle was wrapped with pressure dressing. Discontinued aspirin  vascular surgery consulted, s/p I&D under general anesthesia done on 1/1 As per vascular surgery no restriction for activity, dressing will be changed by MD on  Friday.  # Acute blood loss anemia due to left ankle hematoma, most likely due to trauma History of iron  deficiency anemia.  Iron  profile within normal range Hb 7.0, transfuse 1 unit of PRBC on 12/31 1/1 Hb 8.5 Monitor H&H and transfuse as needed   # Hypotension, BP was low on arrival, s/p IV fluid Now hypertensive, continue to monitor BP and titrate medication accordingly. Use IV hydralazine   # Polycythemia vera Chronic myelofibrosis -Continue current chemotherapy Jakafi    # Chronic ambulation impairment -Family reported patient has been using roller walker for the last 2 years with home PT.  However about 6 months ago, home PT was suspended due to a slow healing wound after left calf skin biopsy.  Since then the patient ambulation has slowly deteriorated, unsteady at baseline, had first fall 3 weeks ago and came to ED.  Patient was sent from ED to SNF/memory care unit for PT since.   Body mass index is 23.68 kg/m.  Interventions:  Diet: Regular diet DVT Prophylaxis: SCD, pharmacological prophylaxis contraindicated due to hematoma    Advance goals of care discussion: DNR/DNI-limited  Family Communication: family was present at bedside, at the time of interview.  The pt provided permission to discuss medical plan with the family. Opportunity was given to ask question and all questions were answered satisfactorily.   Disposition:  Pt is from memory care unit, admitted with left ankle hematoma due to unwitnessed fall, still has low hemoglobin and risk of fall, which precludes a safe discharge. Discharge to SNF, when stable, may need few days to  improve.  Subjective: No significant events overnight, patient was seen after I&D, she was under anesthesia.  Patient said that she just woke up.  Started to have some pain.  Denies any other complaints.   Physical Exam: General: NAD, lying comfortably Appear in no distress, affect appropriate Eyes: PERRLA ENT: Oral Mucosa Clear, moist   Neck: no JVD,  Cardiovascular: S1 and S2 Present, no Murmur,  Respiratory: good respiratory effort, Bilateral Air entry equal and Decreased, no Crackles, no wheezes Abdomen: Bowel Sound present, Soft and no tenderness,  Skin: no rashes Extremities: no Pedal edema, no calf tenderness. Left ankle hematoma s/p I&D, dressing CDI Neurologic: without any new focal findings Gait not checked due to patient safety concerns  Vitals:   04/02/23 1049 04/02/23 1100 04/02/23 1105 04/02/23 1115  BP: 111/77 (!) 88/63 98/65 96/70   Pulse: 76 79 78 81  Resp: 20 20 19 16   Temp:  97.9 F (36.6 C)  97.6 F (36.4 C)  TempSrc:      SpO2: 93% 96% 95% 96%  Weight:      Height:        Intake/Output Summary (Last 24 hours) at 04/02/2023 1337 Last data filed at 04/02/2023 1101 Gross per 24 hour  Intake 260 ml  Output --  Net 260 ml   Filed Weights   03/31/23 0911  Weight: 55 kg    Data Reviewed: I have personally reviewed and interpreted daily labs, tele strips, imagings as discussed above. I reviewed all nursing notes, pharmacy notes, vitals, pertinent old records I have discussed plan of care as described above with RN and patient/family.  CBC: Recent Labs  Lab 03/31/23 0933 03/31/23 1452 03/31/23 2135 04/01/23 0423 04/02/23 0349  WBC 10.1  --   --  10.4 10.2  NEUTROABS 6.5  --   --   --   --   HGB 8.9* 7.7* 7.1* 7.0* 8.5*  HCT 28.9* 24.7* 22.9* 22.1* 26.3*  MCV 89.8  --   --  87.7 79.5*  PLT 646*  --   --  495* 507*   Basic Metabolic Panel: Recent Labs  Lab 03/31/23 0933 04/01/23 0831 04/02/23 0349  NA 137 136 136  K 4.5 4.1 4.3  CL 102 104 104  CO2 24 25 22   GLUCOSE 98 91 96  BUN 25* 25* 22  CREATININE 1.09* 1.02* 1.06*  CALCIUM  9.0 8.8* 9.0  MG  --  2.0 1.9  PHOS  --  3.1 2.7    Studies: No results found.  Scheduled Meds:  amLODipine   5 mg Oral Daily   citalopram   20 mg Oral Daily   cyanocobalamin   1,000 mcg Oral Daily   feeding supplement  237 mL Oral BID BM    ferrous sulfate   325 mg Oral Daily   hydrocortisone  cream  1 Application Topical BID   levothyroxine   25 mcg Oral Q0600   ruxolitinib  phosphate  10 mg Oral Daily   ruxolitinib  phosphate  5 mg Oral QHS   Continuous Infusions: PRN Meds: acetaminophen , hydrALAZINE , HYDROmorphone  (DILAUDID ) injection, ondansetron  **OR** ondansetron  (ZOFRAN ) IV, polyethylene glycol  Time spent: 40 minutes  Author: ELVAN SOR. MD [facility] 04/02/2023 1:37 PM  To reach On-call, see care teams to locate the attending and reach out to them via [email or web address]. If 7PM-7AM, please contact night-coverage If you still have difficulty reaching the attending provider, please page the [facility] (Director on Call) for [facility] on amion for assistance.

## 2023-04-02 NOTE — Plan of Care (Signed)

## 2023-04-02 NOTE — Progress Notes (Signed)
 PT Cancellation Note  Patient Details Name: Sansa Alkema MRN: [id number] DOB: [birth year]   Cancelled Treatment:    Reason Eval/Treat Not Completed: Patient at procedure or test/unavailable Patient off unit undergoing I&D. Will re-attempt at later date/time.  Maryanne Finder, PT, DPT Physical Therapist - [facility]    Maryl Blalock A Christain Niznik 04/02/2023, 10:52 AM

## 2023-04-02 NOTE — Op Note (Signed)
    Patient name: [name] MRN: [id number] DOB: 02/23/1927 Sex: female  04/02/2023 Pre-operative Diagnosis: Left lower leg hematoma Post-operative diagnosis:  Same Surgeon:  Malvina New Procedure:   #1:  I&D left ankle with debridement of skin and soft tissue Anesthesia:  Conscious sedation Blood Loss:  minimal Specimens:  none  Findings:  After hematoma a necrotic skin was removed, the underlying wound bed looked healthy  Indications: This is a 89 year old female who bumped her left ankle and developed a large hematoma.  She comes in today for surgical washout  Procedure:  The patient was identified in the holding area and taken to [facility] OR ROOM 08  The patient was then placed supine on the table. MAC anesthesia was administered.  The patient was prepped and draped in the usual sterile fashion.  A time out was called and antibiotics were administered.  Digital manipulation of the hematoma helped evacuate the majority of the clotted blood.  There was a large panel of skin that was nonviable and nonadherent.  The skin was resected with cautery, exposing the underlying soft tissue.  Once the hematoma was removed, the wound was irrigated.  The underlying tissue looked very healthy.  Xeroform, 4 x 4, Kerlix and Ace wrap were used for dressing.   Disposition: To PACU stable   V. Malvina New, M.D., [facility] [place] Office: [phone] Pager:  [phone]

## 2023-04-02 NOTE — Progress Notes (Signed)
Patient sent for procedure via bed in stable condition.

## 2023-04-02 NOTE — Progress Notes (Addendum)
 Patient received from procedure via bed in stable condition. Dressing looks clean, dry and intact.

## 2023-04-02 NOTE — Transfer of Care (Addendum)
 Immediate Anesthesia Transfer of Care Note  Patient: Laura Wilcox  Procedure(s) Performed: EVACUATION ANKLE HEMATOMA (Left: Ankle)  Patient Location: PACU  Anesthesia Type:General  Level of Consciousness: awake and alert. Oriented to self  Airway & Oxygen Therapy: Patient Spontanous Breathing  Post-op Assessment: Report given to RN and Post -op Vital signs reviewed and stable  Post vital signs: Reviewed and stable  Last Vitals:  Vitals Value Taken Time  BP 111/77 04/02/23 1047  Temp 36.3 C 04/02/23 1046  Pulse 81 04/02/23 1048  Resp 17 04/02/23 1048  SpO2 92 % 04/02/23 1048  Vitals shown include unfiled device data.  Last Pain:  Vitals:   04/02/23 0751  TempSrc:   PainSc: 0-No pain         Complications: No notable events documented.

## 2023-04-02 NOTE — Anesthesia Postprocedure Evaluation (Signed)
 Anesthesia Post Note  Patient: Laura Wilcox  Procedure(s) Performed: EVACUATION ANKLE HEMATOMA (Left: Ankle)  Patient location during evaluation: PACU Anesthesia Type: General Level of consciousness: awake and alert Pain management: pain level controlled Vital Signs Assessment: post-procedure vital signs reviewed and stable Respiratory status: spontaneous breathing, nonlabored ventilation, respiratory function stable and patient connected to nasal cannula oxygen Cardiovascular status: blood pressure returned to baseline and stable Postop Assessment: no apparent nausea or vomiting Anesthetic complications: no   No notable events documented.   Last Vitals:  Vitals:   04/02/23 1535 04/02/23 1600  BP: (!) 85/66 90/60  Pulse:    Resp: 16   Temp: 37 C   SpO2: 100%     Last Pain:  Vitals:   04/02/23 1600  TempSrc:   PainSc: 0-No pain                 Prentice Murphy

## 2023-04-02 NOTE — Anesthesia Preprocedure Evaluation (Signed)
 Anesthesia Evaluation  Patient identified by MRN, date of birth, ID band Patient awake    Reviewed: Allergy & Precautions, H&P , NPO status , Patient's Chart, lab work & pertinent test results, reviewed documented beta blocker date and time   History of Anesthesia Complications Negative for: history of anesthetic complications  Airway Mallampati: II  TM Distance: >3 FB Neck ROM: full    Dental  (+) Poor Dentition, Dental Advidsory Given, Chipped, Missing   Pulmonary neg pulmonary ROS   Pulmonary exam normal breath sounds clear to auscultation       Cardiovascular Exercise Tolerance: Good negative cardio ROS Normal cardiovascular exam Rhythm:regular Rate:Normal     Neuro/Psych  PSYCHIATRIC DISORDERS  Depression   Dementia negative neurological ROS     GI/Hepatic negative GI ROS, Neg liver ROS,,,  Endo/Other  negative endocrine ROS    Renal/GU CRFRenal disease  negative genitourinary   Musculoskeletal   Abdominal   Peds  Hematology  (+) Blood dyscrasia Patient has Polycythemia   Anesthesia Other Findings Past Medical History: 10/22/2020: Acute metabolic encephalopathy 12/30/2018: Confusion No date: Polycythemia 04/11/2021: SCC (squamous cell carcinoma)     Comment:  R med dorsum hand - ED&C SCCIS 10/09/2021: SCC (squamous cell carcinoma)     Comment:  left lateral eye, EDC 01/28/2022: SCC (squamous cell carcinoma)     Comment:  left lateral eye, recurrent, EDC. 5FU/Calcipotriene               cream. 01/28/2022: SCC (squamous cell carcinoma)     Comment:  in situ, left medial cheek, 5FU/Calcipotriene Cr 02/26/2022: SCC (squamous cell carcinoma)     Comment:  SCC IS, Right Ulnar Dorsal Hand, 5FU/Calcipotriene cream 09/03/2022: SCC (squamous cell carcinoma)     Comment:  R ankle, EDC 01/20/2023: SCC (squamous cell carcinoma)     Comment:  SCC IS Left medial lower leg, EDC 09/06/2020: Squamous cell carcinoma  of skin     Comment:  R thumb - ED&C 01/02/2020: Urinary tract infection without hematuria 06/02/2017: Urticaria   Reproductive/Obstetrics negative OB ROS                             Anesthesia Physical Anesthesia Plan  ASA: 2  Anesthesia Plan: General   Post-op Pain Management:    Induction: Intravenous  PONV Risk Score and Plan: 3 and Propofol  infusion, TIVA and Treatment may vary due to age or medical condition  Airway Management Planned: Natural Airway and Simple Face Mask  Additional Equipment:   Intra-op Plan:   Post-operative Plan:   Informed Consent: I have reviewed the patients History and Physical, chart, labs and discussed the procedure including the risks, benefits and alternatives for the proposed anesthesia with the patient or authorized representative who has indicated his/her understanding and acceptance.     Dental Advisory Given  Plan Discussed with: Anesthesiologist, CRNA and Surgeon  Anesthesia Plan Comments:        Anesthesia Quick Evaluation

## 2023-04-02 NOTE — Plan of Care (Signed)
  Problem: Education: Goal: Knowledge of General Education information will improve Description: Including pain rating scale, medication(s)/side effects and non-pharmacologic comfort measures Outcome: Progressing   Problem: Clinical Measurements: Goal: Ability to maintain clinical measurements within normal limits will improve Outcome: Progressing   Problem: Activity: Goal: Risk for activity intolerance will decrease Outcome: Progressing   Problem: Coping: Goal: Level of anxiety will decrease Outcome: Progressing   Problem: Elimination: Goal: Will not experience complications related to bowel motility Outcome: Progressing   Problem: Pain Management: Goal: General experience of comfort will improve Outcome: Progressing   Problem: Safety: Goal: Ability to remain free from injury will improve Outcome: Progressing

## 2023-04-03 ENCOUNTER — Encounter: Payer: Self-pay | Admitting: Surgery

## 2023-04-03 DIAGNOSIS — T148XXA Other injury of unspecified body region, initial encounter: Secondary | ICD-10-CM | POA: Diagnosis not present

## 2023-04-03 LAB — BASIC METABOLIC PANEL
Anion gap: 10 (ref 5–15)
BUN: 21 mg/dL (ref 8–23)
CO2: 22 mmol/L (ref 22–32)
Calcium: 8.8 mg/dL — ABNORMAL LOW (ref 8.9–10.3)
Chloride: 103 mmol/L (ref 98–111)
Creatinine, Ser: 1.1 mg/dL — ABNORMAL HIGH (ref 0.44–1.00)
GFR, Estimated: 47 mL/min — ABNORMAL LOW (ref >60)
Glucose, Bld: 95 mg/dL (ref 70–99)
Potassium: 4.4 mmol/L (ref 3.5–5.1)
Sodium: 135 mmol/L (ref 135–145)

## 2023-04-03 LAB — CBC
HCT: 26.1 % — ABNORMAL LOW (ref 36.0–46.0)
Hemoglobin: 8.4 g/dL — ABNORMAL LOW (ref 12.0–15.0)
MCH: 25.8 pg — ABNORMAL LOW (ref 26.0–34.0)
MCHC: 32.2 g/dL (ref 30.0–36.0)
MCV: 80.3 fL (ref 80.0–100.0)
Platelets: 502 10*3/uL — ABNORMAL HIGH (ref 150–400)
RBC: 3.25 MIL/uL — ABNORMAL LOW (ref 3.87–5.11)
RDW: 29.1 % — ABNORMAL HIGH (ref 11.5–15.5)
WBC: 10.1 10*3/uL (ref 4.0–10.5)
nRBC: 0.3 % — ABNORMAL HIGH (ref 0.0–0.2)

## 2023-04-03 LAB — MAGNESIUM: Magnesium: 2 mg/dL (ref 1.7–2.4)

## 2023-04-03 LAB — PHOSPHORUS: Phosphorus: 3.1 mg/dL (ref 2.5–4.6)

## 2023-04-03 MED ORDER — ASPIRIN 81 MG PO TBEC
81.0000 mg | DELAYED_RELEASE_TABLET | Freq: Every day | ORAL | Status: DC
  Administered 2023-04-03 – 2023-04-11 (×9): 81 mg via ORAL
  Filled 2023-04-03 (×9): qty 1

## 2023-04-03 NOTE — Progress Notes (Signed)
 PT Cancellation Note  Patient Details Name: Laura Wilcox MRN: [id number] DOB: 02-25-1927   Cancelled Treatment:    Reason Eval/Treat Not Completed: Other (comment),. Eval attempted 3 days in a row. Pt sleeping upon arrival, wakens with tactile touch. Is very adamant to not perform OOB mobility and becomes agitated with mobility attempts such as removing covers. Breakfast untouched at bedside, offered meal, pt declined and reports she just wants to sleep. Care team and family member advised.   Shaquala Broeker 04/03/2023, 10:38 AM Corean Dade, PT, DPT, GCS [phone]

## 2023-04-03 NOTE — Progress Notes (Signed)
 [facility] Progress Note  Patient: Laura Wilcox    FMW:[id number]  DOA: 03/31/2023     Date of Service: the patient was seen and examined on 04/03/2023  Chief Complaint  Patient presents with   Extremity Laceration   Brief hospital course: Laura Wilcox is a 89 y.o. female with PMH of Advanced dementia, polycythemia vera and chronic myelofibrosis on chemotherapy, chronic iron  deficiency anemia, chronic ambulation impairment, was found on the floor with a large left ankle hematoma in the morning on 03/31/23.   Patient does not remember what has happened, and this morning patient was found on the floor awake, with a large left inner ankle hematoma.  Patient does not remember whether she had a fall or hit anything with the left foot last night or this morning.  Family at bedside reported that the patient sustained a unwitnessed fall 3 weeks ago and hit her head with a large hematoma but no LOC.  At baseline, patient has dementia and family reported that the patient prefers to staying  dehydrated by avoiding drinking water so that does not have to change diaper On arrival in the ED, patient was found to be hypotensive blood pressure 75/35, none tachycardia nonhypoxic afebrile.  Blood work showed hemoglobin 8.9 compared to baseline 9.9, platelet 646, creatinine 1.0, BUN 25, glucose 98.   Patient was given 1 L of IV bolus in the ED.  ED physician tried to manually extract hematoma and clots from the left ankle.   Assessment and Plan:  # Left ankle hematoma Patient was found on the floor, significant dementia, poor historian.  Hematoma most likely secondary to unwitnessed trauma and fall. S/p manual extraction of blood clots done by ED physician, left ankle was wrapped with pressure dressing. 1/2 Resumed Aspirin  vascular surgery consulted, s/p I&D under general anesthesia done on 1/1 As per vascular surgery no restriction for activity, dressing will be changed by MD on Friday.  #  Acute blood loss anemia due to left ankle hematoma, most likely due to trauma History of iron  deficiency anemia.  Iron  profile within normal range Hb 7.0, transfuse 1 unit of PRBC on 12/31 1/2 Hb 8.4 stable  Monitor H&H and transfuse if Hb <7.0   # Hypotension, BP was low on arrival, s/p IV fluid Now hypertensive, continue to monitor BP and titrate medication accordingly. 1/1 started amlodipine  5 mg p.o. daily Use IV hydralazine   # Polycythemia vera Chronic myelofibrosis -Continue current chemotherapy Jakafi    # Chronic ambulation impairment -Family reported patient has been using roller walker for the last 2 years with home PT.  However about 6 months ago, home PT was suspended due to a slow healing wound after left calf skin biopsy.  Since then the patient ambulation has slowly deteriorated, unsteady at baseline, had first fall 3 weeks ago and came to ED.  Patient was sent from ED to SNF/memory care unit for PT since.   Body mass index is 23.68 kg/m.  Interventions:  Diet: Regular diet DVT Prophylaxis: SCD, pharmacological prophylaxis contraindicated due to hematoma    Advance goals of care discussion: DNR/DNI-limited  Family Communication: family was present at bedside, at the time of interview.  The pt provided permission to discuss medical plan with the family. Opportunity was given to ask question and all questions were answered satisfactorily.   Disposition:  Pt is from memory care unit, admitted with left ankle hematoma due to unwitnessed fall, s/p I&D done by vascular surgery, dressing will be changed on Friday  1/3 by vascular surgery.  which precludes a safe discharge. Discharge to SNF/memory care unit, when cleared by vascular surgery, most likely tomorrow a.m.  Subjective: No significant events overnight, patient was resting comfortably, denied any complaint.  Patient's family was at bedside, management plan discussed.  Physical Exam: General: NAD, lying  comfortably Appear in no distress, affect appropriate Eyes: PERRLA ENT: Oral Mucosa Clear, moist  Neck: no JVD,  Cardiovascular: S1 and S2 Present, no Murmur,  Respiratory: good respiratory effort, Bilateral Air entry equal and Decreased, no Crackles, no wheezes Abdomen: Bowel Sound present, Soft and no tenderness,  Skin: no rashes Extremities: no Pedal edema, no calf tenderness. Left ankle hematoma s/p I&D, dressing CDI Neurologic: without any new focal findings Gait not checked due to patient safety concerns  Vitals:   04/02/23 1535 04/02/23 1600 04/02/23 2036 04/03/23 0557  BP: (!) 85/66 90/60 101/71 (!) 141/69  Pulse:   81 72  Resp: 16  20 20   Temp: 98.6 F (37 C)  97.7 F (36.5 C) 97.8 F (36.6 C)  TempSrc:      SpO2: 100%  97% 93%  Weight:      Height:       No intake or output data in the 24 hours ending 04/03/23 1552  Filed Weights   03/31/23 0911  Weight: 55 kg    Data Reviewed: I have personally reviewed and interpreted daily labs, tele strips, imagings as discussed above. I reviewed all nursing notes, pharmacy notes, vitals, pertinent old records I have discussed plan of care as described above with RN and patient/family.  CBC: Recent Labs  Lab 03/31/23 0933 03/31/23 1452 03/31/23 2135 04/01/23 0423 04/02/23 0349 04/03/23 0537  WBC 10.1  --   --  10.4 10.2 10.1  NEUTROABS 6.5  --   --   --   --   --   HGB 8.9* 7.7* 7.1* 7.0* 8.5* 8.4*  HCT 28.9* 24.7* 22.9* 22.1* 26.3* 26.1*  MCV 89.8  --   --  87.7 79.5* 80.3  PLT 646*  --   --  495* 507* 502*   Basic Metabolic Panel: Recent Labs  Lab 03/31/23 0933 04/01/23 0831 04/02/23 0349 04/03/23 0537  NA 137 136 136 135  K 4.5 4.1 4.3 4.4  CL 102 104 104 103  CO2 24 25 22 22   GLUCOSE 98 91 96 95  BUN 25* 25* 22 21  CREATININE 1.09* 1.02* 1.06* 1.10*  CALCIUM  9.0 8.8* 9.0 8.8*  MG  --  2.0 1.9 2.0  PHOS  --  3.1 2.7 3.1    Studies: No results found.  Scheduled Meds:  amLODipine   5 mg Oral  Daily   aspirin  EC  81 mg Oral Daily   citalopram   20 mg Oral Daily   cyanocobalamin   1,000 mcg Oral Daily   feeding supplement  237 mL Oral BID BM   ferrous sulfate   325 mg Oral Daily   hydrocortisone  cream  1 Application Topical BID   levothyroxine   25 mcg Oral Q0600   ruxolitinib  phosphate  10 mg Oral Daily   ruxolitinib  phosphate  5 mg Oral QHS   Continuous Infusions: PRN Meds: acetaminophen , hydrALAZINE , HYDROmorphone  (DILAUDID ) injection, ondansetron  **OR** ondansetron  (ZOFRAN ) IV, polyethylene glycol  Time spent: 40 minutes  Author: ELVAN SOR. MD [facility] 04/03/2023 3:52 PM  To reach On-call, see care teams to locate the attending and reach out to them via [email or web address]. If 7PM-7AM, please contact night-coverage If you still have difficulty  reaching the attending provider, please page the [facility] (Director on Call) for [facility] on amion for assistance.

## 2023-04-03 NOTE — TOC Progression Note (Signed)
 Transition of Care [facility]) - Progression Note    Patient Details  Name: Laura Wilcox MRN: [id number] Date of Birth: 15-Jul-1926  [facility] CM/SW Contact  Abrian Hanover A Forest Pruden, RN Phone Number: 04/03/2023, 3:49 PM  Clinical Narrative:    Chart reviewed.  Noted that patient is a resident of [facility].  PT attempting to work with patient for disposition recommendations.    TOC will continue to follow for discharge planning.          Expected Discharge Plan and Services                                               Social Determinants of Health (SDOH) Interventions SDOH Screenings   Food Insecurity: No Food Insecurity (04/01/2023)  Housing: Low Risk  (04/01/2023)  Transportation Needs: No Transportation Needs (04/01/2023)  Utilities: Not At Risk (04/01/2023)  Alcohol  Screen: Low Risk  (06/18/2021)  Depression (PHQ2-9): High Risk (06/18/2021)  Social Connections: Patient Unable To Answer (04/01/2023)  Tobacco Use: Low Risk  (04/02/2023)    Readmission Risk Interventions    06/26/2021    4:26 PM  Readmission Risk Prevention Plan  Transportation Screening Complete  PCP or Specialist Appt within 5-7 Days Complete  Home Care Screening Complete  Medication Review (RN CM) Complete

## 2023-04-04 ENCOUNTER — Telehealth: Payer: Self-pay | Admitting: *Deleted

## 2023-04-04 ENCOUNTER — Inpatient Hospital Stay: Payer: Medicare Other

## 2023-04-04 DIAGNOSIS — T148XXA Other injury of unspecified body region, initial encounter: Secondary | ICD-10-CM | POA: Diagnosis not present

## 2023-04-04 LAB — BASIC METABOLIC PANEL
Anion gap: 12 (ref 5–15)
BUN: 25 mg/dL — ABNORMAL HIGH (ref 8–23)
CO2: 20 mmol/L — ABNORMAL LOW (ref 22–32)
Calcium: 8.6 mg/dL — ABNORMAL LOW (ref 8.9–10.3)
Chloride: 101 mmol/L (ref 98–111)
Creatinine, Ser: 1.27 mg/dL — ABNORMAL HIGH (ref 0.44–1.00)
GFR, Estimated: 39 mL/min — ABNORMAL LOW (ref >60)
Glucose, Bld: 86 mg/dL (ref 70–99)
Potassium: 4.5 mmol/L (ref 3.5–5.1)
Sodium: 133 mmol/L — ABNORMAL LOW (ref 135–145)

## 2023-04-04 LAB — CBC
HCT: 24.7 % — ABNORMAL LOW (ref 36.0–46.0)
Hemoglobin: 7.7 g/dL — ABNORMAL LOW (ref 12.0–15.0)
MCH: 25.3 pg — ABNORMAL LOW (ref 26.0–34.0)
MCHC: 31.2 g/dL (ref 30.0–36.0)
MCV: 81.3 fL (ref 80.0–100.0)
Platelets: 486 10*3/uL — ABNORMAL HIGH (ref 150–400)
RBC: 3.04 MIL/uL — ABNORMAL LOW (ref 3.87–5.11)
RDW: 28.4 % — ABNORMAL HIGH (ref 11.5–15.5)
WBC: 10.7 10*3/uL — ABNORMAL HIGH (ref 4.0–10.5)
nRBC: 0.5 % — ABNORMAL HIGH (ref 0.0–0.2)

## 2023-04-04 LAB — PHOSPHORUS: Phosphorus: 3.3 mg/dL (ref 2.5–4.6)

## 2023-04-04 LAB — MAGNESIUM: Magnesium: 1.9 mg/dL (ref 1.7–2.4)

## 2023-04-04 MED ORDER — SODIUM CHLORIDE 0.9 % IV SOLN: INTRAVENOUS | Status: AC

## 2023-04-04 MED ORDER — HYDRALAZINE HCL 20 MG/ML IJ SOLN: 10.0000 mg | Freq: Four times a day (QID) | INTRAMUSCULAR | Status: DC | PRN

## 2023-04-04 MED ORDER — HYDRALAZINE HCL 50 MG PO TABS
50.0000 mg | ORAL_TABLET | Freq: Four times a day (QID) | ORAL | Status: DC | PRN
Start: 2023-04-04 — End: 2023-04-11

## 2023-04-04 NOTE — Care Management Important Message (Signed)
 Important Message  Patient Details  Name: Nohealani Medinger MRN: [id number] Date of Birth: [birth year]   Important Message Given:  Yes - Medicare IM     Ziere Docken W, CMA 04/04/2023, 10:00 AM

## 2023-04-04 NOTE — Telephone Encounter (Signed)
 Spoke to [place] and informed her of MD recommendation to stop Jakafi. She requested appt next week to be cancelled. Asked her to call back once pt is out of hospital so we can r/s appts.   Please cancel next week's appt

## 2023-04-04 NOTE — Telephone Encounter (Signed)
 Per Dr. Cathie Hoops "stop Jakafi during admission."  Called [name] and left VM to let her know of this.

## 2023-04-04 NOTE — Telephone Encounter (Signed)
 Patient is in hospital at [facility] and the nurses there are asking her ro bring patient Jakafi  from nursing home to them to administer to her, Laura Wilcox  has concerns about her taking the taking treatment with her lab counts and wants Dr Babara to look at her labs and let her know if patient should be taking Jakafi  or not. Please advise           Component Ref Range & Units (hover) 05:24 (04/04/23) 1 d ago (04/03/23) 2 d ago (04/02/23) 3 d ago (04/01/23) 4 d ago (03/31/23) 4 d ago (03/31/23) 4 d ago (03/31/23)  WBC 10.7 High  10.1 10.2 10.4   10.1 CM  RBC 3.04 Low  3.25 Low  3.31 Low  2.52 Low    3.22 Low   Hemoglobin 7.7 Low  8.4 Low  8.5 Low  7.0 Low  7.1 Low  7.7 Low  8.9 Low   HCT 24.7 Low  26.1 Low  26.3 Low  22.1 Low  22.9 Low  CM 24.7 Low  CM 28.9 Low   MCV 81.3 80.3 79.5 Low  CM 87.7   89.8  MCH 25.3 Low  25.8 Low  25.7 Low  27.8   27.6  MCHC 31.2 32.2 32.3 31.7   30.8  RDW 28.4 High  29.1 High  30.2 High  25.3 High    24.8 High   Platelets 486 High  502 High  507 High  495 High    646 High   nRBC 0.5 High  0.3 High  CM 0.5 High  CM 0.3 High  CM   0.3 High   Comment: Performed at [facility], [address] [place], [place] [address]   Component Ref Range & Units (hover) 04:31 1 d ago 2 d ago 3 d ago 4 d ago 4 wk ago 3 mo ago  Sodium 133 Low  135 136 136 137 137 136  Potassium 4.5 4.4 4.3 4.1 4.5 4.3 4.5  Chloride 101 103 104 104 102 101 100  CO2 20 Low  22 22 25 24 26 26   Glucose, Bld 86 95 CM 96 CM 91 CM 98 CM 107 High  CM 104 High  CM  Comment: Glucose reference range applies only to samples taken after fasting for at least 8 hours.  BUN 25 High  21 22 25  High  25 High  23 30 High   Creatinine, Ser 1.27 High  1.10 High  1.06 High  1.02 High  1.09 High  1.47 High  1.41 High   Calcium  8.6 Low  8.8 Low  9.0 8.8 Low  9.0 9.0 9.7  GFR, Estimated 39 Low  47 Low  CM 49 Low  CM 51 Low  CM 47 Low  CM 33 Low  CM   Comment: (NOTE) Calculated using the CKD-EPI Creatinine Equation  (2021)  Anion gap 12 10 CM 10 CM 7 CM 11 CM 10 CM 10 CM  Comment: Performed at [facility], [address] [place], [place] [address]  Resulting Agency [facility] LAB CH [facility] CH [facility] CH [facility] LAB CH CLIN LAB        Specimen Collected: 04/04/23 04:31 Last Resulted: 04/04/23 05:18

## 2023-04-04 NOTE — Progress Notes (Signed)
    Subjective  - POD #2  No complaints.  Daughter at bedside   Physical Exam:  Dressing was changed today.  The wound bed looks healthy        Assessment/Plan:  POD #2  Status post I&D of left ankle hematoma.  Will plan on dressing change over the weekend.  I am trying to make arrangements with Dr. Lowery in [place] (plastic surgery) who has previously help this patient with a leg ulcer.  She can get dressing changes at home with home health when she is stable for discharge.  Wells Shannell Mikkelsen 04/04/2023 3:16 PM --  Vitals:   04/03/23 1956 04/04/23 0757  BP: 128/66 (!) 82/61  Pulse: 84 74  Resp: 17   Temp: (!) 97.5 F (36.4 C) 97.7 F (36.5 C)  SpO2: 93% 100%   No intake or output data in the 24 hours ending 04/04/23 1516   Laboratory CBC    Component Value Date/Time   WBC 10.7 (H) 04/04/2023 0524   HGB 7.7 (L) 04/04/2023 0524   HGB 10.3 (L) 02/25/2023 1347   HGB 8.1 (L) 06/18/2021 1514   HCT 24.7 (L) 04/04/2023 0524   HCT 25.2 (L) 06/18/2021 1514   PLT 486 (H) 04/04/2023 0524   PLT 677 (H) 02/25/2023 1347   PLT 452 (H) 06/18/2021 1514    BMET    Component Value Date/Time   NA 133 (L) 04/04/2023 0431   NA 134 06/18/2021 1514   K 4.5 04/04/2023 0431   CL 101 04/04/2023 0431   CO2 20 (L) 04/04/2023 0431   GLUCOSE 86 04/04/2023 0431   BUN 25 (H) 04/04/2023 0431   BUN 19 06/18/2021 1514   CREATININE 1.27 (H) 04/04/2023 0431   CREATININE 1.41 (H) 12/30/2022 1118   CALCIUM  8.6 (L) 04/04/2023 0431   GFRNONAA 39 (L) 04/04/2023 0431   GFRNONAA 35 (L) 12/30/2022 1118   GFRAA 49 (L) 12/27/2019 0935    COAG Lab Results  Component Value Date   INR 1.0 03/31/2023   INR 1.1 10/22/2020   INR 0.92 12/03/2017   No results found for: PTT  Antibiotics Anti-infectives (From admission, onward)    None        V. Malvina Serene CLORE, M.D., [facility] [place] Office: [phone] Pager:  [phone]

## 2023-04-04 NOTE — Progress Notes (Signed)
 Occupational Therapy Treatment Patient Details Name: Laura Wilcox MRN: [id number] DOB: 1926-06-13 Today's Date: 04/04/2023   History of present illness Pt. is a 89 y.o.  female who was admitted with a large left ankle  hematoma, and laceration sustained during a fall at  her ALF. PMHx includes: Advanced Dementia, Polycythemia Vera, Chronic Myelofibrosis on Chemotherapy, chronic ambulation impairment,   OT comments  Pt is supine in bed on arrival. Asleep and groggy after receiving pain meds. Daughter present and able to motivate pt to participate in PT/OT co-tx session. She denies pain. Pt performed supine to sit at EOB with Mod A x2 and pt attempting to initiate. Pt's lethargic throughout opening and closing eyes intermittently needing verb cues to arouse. BP taken and noted to be 75/64 with pt endorsing dizziness. Pt needing MIN to SBA for balance and attempting scooting requiring Mod A. Returned to supine with Max A x2 and repositioned in bed. MD present at end of session and aware of low BP. Pt with all needs in place and will cont to require skilled acute OT services to maximize her safety and IND to return to PLOF.       If plan is discharge home, recommend the following:  Two people to help with walking and/or transfers;A lot of help with bathing/dressing/bathroom;Direct supervision/assist for medications management;Supervision due to cognitive status;Direct supervision/assist for financial management;Assist for transportation;Help with stairs or ramp for entrance   Equipment Recommendations  Other (comment) (defer to next venue)    Recommendations for Other Services      Precautions / Restrictions Precautions Precautions: Fall Restrictions Weight Bearing Restrictions Per Provider Order: No       Mobility Bed Mobility Overal bed mobility: Needs Assistance Bed Mobility: Supine to Sit, Sit to Supine     Supine to sit: Mod assist, +2 for physical assistance, Used rails Sit to  supine: Max assist, +2 for physical assistance   General bed mobility comments: Mod A x2 to get to EOB, pt initiating, but too weak and groggy to perform without assist; attempts to scoot with Mod A; lethargic and closes eyes throughout session; BP low and pt reporting dizziness; required Max A x2 to return to supine    Transfers                   General transfer comment: deferred due to orthostatic BP and patient symptomatic     Balance Overall balance assessment: Needs assistance Sitting-balance support: Feet supported, Bilateral upper extremity supported Sitting balance-Leahy Scale: Fair Sitting balance - Comments: Min to SBA required throughout d/t impaired balance and lethargy                                   ADL either performed or assessed with clinical judgement   ADL                                         General ADL Comments: unable to assess-pt groggy, low BP, dizzy    Extremity/Trunk Assessment Upper Extremity Assessment Upper Extremity Assessment: Defer to OT evaluation   Lower Extremity Assessment Lower Extremity Assessment: Generalized weakness        Vision       Perception     Praxis      Cognition Arousal: Lethargic Behavior During Therapy:  (groggy)  Overall Cognitive Status: History of cognitive impairments - at baseline                                          Exercises      Shoulder Instructions       General Comments      Pertinent Vitals/ Pain       Pain Assessment Pain Assessment: No/denies pain  Home Living Family/patient expects to be discharged to:: Assisted living ([facility]; Memory care unit)                             Home Equipment: Agricultural [employer] (2 wheels)   Additional Comments: daughter lives in the area, present during session      Prior Functioning/Environment              Frequency  Min 1X/week        Progress  Toward Goals  OT Goals(current goals can now be found in the care plan section)  Progress towards OT goals: Progressing toward goals  Acute Rehab OT Goals OT Goal Formulation: Patient unable to participate in goal setting  Plan      Co-evaluation    PT/OT/SLP Co-Evaluation/Treatment: Yes Reason for Co-Treatment: Complexity of the patient's impairments (multi-system involvement);For patient/therapist safety;To address functional/ADL transfers PT goals addressed during session: Mobility/safety with mobility;Balance OT goals addressed during session: ADL's and self-care      AM-PAC OT 6 Clicks Daily Activity     Outcome Measure   Help from another person eating meals?: A Lot Help from another person taking care of personal grooming?: A Lot Help from another person toileting, which includes using toliet, bedpan, or urinal?: A Lot Help from another person bathing (including washing, rinsing, drying)?: A Lot Help from another person to put on and taking off regular upper body clothing?: A Lot Help from another person to put on and taking off regular lower body clothing?: A Lot 6 Click Score: 12    End of Session    OT Visit Diagnosis: Unsteadiness on feet (R26.81);History of falling (Z91.81);Muscle weakness (generalized) (M62.81)   Activity Tolerance Patient limited by fatigue   Patient Left in bed;with call bell/phone within reach;with bed alarm set;with family/visitor present   Nurse Communication Mobility status        Time: 9140-9082 OT Time Calculation (min): 18 min  Charges: OT General Charges $OT Visit: 1 Visit OT Treatments $Therapeutic Activity: 8-22 mins  Laura Wilcox, OTR/L  04/04/23, 10:39 AM   Laura Wilcox 04/04/2023, 10:37 AM

## 2023-04-04 NOTE — Plan of Care (Signed)
  Problem: Education: Goal: Knowledge of General Education information will improve Description: Including pain rating scale, medication(s)/side effects and non-pharmacologic comfort measures Outcome: Progressing   Problem: Activity: Goal: Risk for activity intolerance will decrease Outcome: Progressing   Problem: Nutrition: Goal: Adequate nutrition will be maintained Outcome: Progressing   Problem: Coping: Goal: Level of anxiety will decrease Outcome: Progressing   Problem: Elimination: Goal: Will not experience complications related to bowel motility Outcome: Progressing   Problem: Pain Management: Goal: General experience of comfort will improve Outcome: Progressing   Problem: Safety: Goal: Ability to remain free from injury will improve Outcome: Progressing   Problem: Skin Integrity: Goal: Risk for impaired skin integrity will decrease Outcome: Progressing

## 2023-04-04 NOTE — Progress Notes (Addendum)
 Physical Therapy Evaluation Patient Details Name: Laura Wilcox MRN: [id number] DOB: [birth year] Today's Date: 04/04/2023  History of Present Illness  Pt. is a 89 y.o.  female who was admitted with a large left ankle  hematoma, and laceration sustained during a fall at  her ALF. PMHx includes: Advanced Dementia, Polycythemia Vera, Chronic Myelofibrosis on Chemotherapy, chronic ambulation impairment,   Clinical Impression  Chart Reviewed. Patient asleep upon therapist arrival with daughter at bedside. Patient awakens to therapist voice, and agreeable to attempt to mobilize with encouragement from therapist and daughter. Prior to admission, patient was living in ALF on Memory Care Unit per daughter reports, and was ambulatory with RW.   On evaluation, patient was very lethargic throughout session with difficulty maintaining eyes open. Patient require Mod A +2 for supine > sit. Patient able to sit on EOB for ~ 2-3 minutes with intermittent assist to maintain static seated balance. Patient demo minimal engagement for activity, and reports lightheadedness. BP assessed with patient orthostatic: 75/64. Max A +2 for return from sit > supine to lethargy. Patient sp02 stable on 2L supplemental oxygen throughout session. Patient left with all needs in reach, bed alarm set, and family member and MD at bedside at end of session. Patient will benefit from skilled acute PT services to address functional impairments (see below for additional) and maximize functional mobility. Anticipate the need for follow up PT services upon acute hospital discharge. Will continue to follow acutely.        If plan is discharge home, recommend the following: A lot of help with walking and/or transfers;A lot of help with bathing/dressing/bathroom;Assist for transportation;Supervision due to cognitive status   Can travel by private vehicle   No    Equipment Recommendations Other (comment) (TBD at next venue of care)   Recommendations for Other Services       Functional Status Assessment Patient has had a recent decline in their functional status and demonstrates the ability to make significant improvements in function in a reasonable and predictable amount of time.     Precautions / Restrictions Precautions Precautions: Fall Restrictions Weight Bearing Restrictions Per Provider Order: No      Mobility  Bed Mobility Overal bed mobility: Needs Assistance Bed Mobility: Supine to Sit, Sit to Supine     Supine to sit: Mod assist, +2 for physical assistance, Used rails Sit to supine: Max assist, +2 for physical assistance   General bed mobility comments: Patient agreeable to sit EOB with therapist and daughter's encouragement. Mod A +2 for supine > sit, increased time for repositioning of LE's but able to complete with assist. Pt very lethargic throughout, difficulty maintaining eyes open and engaging in session. Reports lightheadedness, BP: 75/64. Patient request to return to bed, Max A +2 for sit > supine. Require assist to scoot. Pt on 2L supplemental oxygen, with Sp02 low to mid 90's with activity.    Transfers                   General transfer comment: deferred due to orthostatic BP and patient symptomatic    Ambulation/Gait               General Gait Details: deferred due to orthostatic BP and patient symptomatic  Stairs            Wheelchair Mobility     Tilt Bed    Modified Rankin (Stroke Patients Only)       Balance Overall balance assessment: Needs assistance Sitting-balance support: Feet  supported, Bilateral upper extremity supported Sitting balance-Leahy Scale: Fair Sitting balance - Comments: intermittent assist due to imbalance required; increased lethargy requiring increased assistance                                     Pertinent Vitals/Pain Pain Assessment Pain Assessment: No/denies pain    Home Living Family/patient expects  to be discharged to:: Assisted living ([facility]; Memory care unit)                 Home Equipment: [employer] (2 wheels) Additional Comments: daughter lives in the area, present during session    Prior Function Prior Level of Function : Needs assist             Mobility Comments: Per daughter reports, patient was ambulatory using a 2-wheeled  RW ADLs Comments: Supervision for bathing. IND with dressing     Extremity/Trunk Assessment   Upper Extremity Assessment Upper Extremity Assessment: Defer to OT evaluation    Lower Extremity Assessment Lower Extremity Assessment: Generalized weakness       Communication   Communication Communication: Hearing impairment Cueing Techniques: Verbal cues;Gestural cues  Cognition Arousal: Lethargic   Overall Cognitive Status: History of cognitive impairments - at baseline                                          General Comments      Exercises Other Exercises Other Exercises: PT educating family member on POC, current level of assist. MD present in room for conversation.   Assessment/Plan    PT Assessment Patient needs continued PT services  PT Problem List Decreased strength;Decreased activity tolerance;Decreased balance;Decreased mobility;Decreased cognition       PT Treatment Interventions DME instruction;Gait training;Functional mobility training;Therapeutic activities;Therapeutic exercise;Balance training;Neuromuscular re-education;Patient/family education    PT Goals (Current goals can be found in the Care Plan section)  Acute Rehab PT Goals PT Goal Formulation: Patient unable to participate in goal setting Time For Goal Achievement: 04/18/23 Potential to Achieve Goals: Fair    Frequency Min 1X/week     Co-evaluation PT/OT/SLP Co-Evaluation/Treatment: Yes Reason for Co-Treatment: Complexity of the patient's impairments (multi-system involvement);For patient/therapist  safety;To address functional/ADL transfers PT goals addressed during session: Mobility/safety with mobility;Balance         AM-PAC PT 6 Clicks Mobility  Outcome Measure Help needed turning from your back to your side while in a flat bed without using bedrails?: A Lot Help needed moving from lying on your back to sitting on the side of a flat bed without using bedrails?: A Lot Help needed moving to and from a bed to a chair (including a wheelchair)?: A Lot Help needed standing up from a chair using your arms (e.g., wheelchair or bedside chair)?: A Lot Help needed to walk in hospital room?: A Lot Help needed climbing 3-5 steps with a railing? : A Lot 6 Click Score: 12    End of Session Equipment Utilized During Treatment: Oxygen Activity Tolerance: Patient limited by lethargy Patient left: in bed;with bed alarm set;with family/visitor present;with call bell/phone within reach Nurse Communication: Mobility status PT Visit Diagnosis: Other abnormalities of gait and mobility (R26.89);Muscle weakness (generalized) (M62.81);Difficulty in walking, not elsewhere classified (R26.2);Unsteadiness on feet (R26.81)    Time: 9140-9083 PT Time Calculation (min) (ACUTE ONLY): 17  min   Charges:   PT Evaluation $PT Eval Low Complexity: 1 Low   PT General Charges $$ ACUTE PT VISIT: 1 Visit        Carolyn HERO Fairly, PT, DPT 04/04/23 9:40 AM

## 2023-04-04 NOTE — Progress Notes (Signed)
 [facility] Progress Note  Patient: Laura Wilcox    FMW:[id number]  DOA: 03/31/2023     Date of Service: the patient was seen and examined on 04/04/2023  Chief Complaint  Patient presents with   Extremity Laceration   Brief hospital course: Laura Wilcox is a 89 y.o. female with PMH of Advanced dementia, polycythemia vera and chronic myelofibrosis on chemotherapy, chronic iron  deficiency anemia, chronic ambulation impairment, was found on the floor with a large left ankle hematoma in the morning on 03/31/23.   Patient does not remember what has happened, and this morning patient was found on the floor awake, with a large left inner ankle hematoma.  Patient does not remember whether she had a fall or hit anything with the left foot last night or this morning.  Family at bedside reported that the patient sustained a unwitnessed fall 3 weeks ago and hit her head with a large hematoma but no LOC.  At baseline, patient has dementia and family reported that the patient prefers to staying  dehydrated by avoiding drinking water so that does not have to change diaper On arrival in the ED, patient was found to be hypotensive blood pressure 75/35, none tachycardia nonhypoxic afebrile.  Blood work showed hemoglobin 8.9 compared to baseline 9.9, platelet 646, creatinine 1.0, BUN 25, glucose 98.   Patient was given 1 L of IV bolus in the ED.  ED physician tried to manually extract hematoma and clots from the left ankle.   Assessment and Plan:  # Left ankle hematoma Patient was found on the floor, significant dementia, poor historian.  Hematoma most likely secondary to unwitnessed trauma and fall. S/p manual extraction of blood clots done by ED physician, left ankle was wrapped with pressure dressing. 1/2 Resumed Aspirin  vascular surgery consulted, s/p I&D under general anesthesia done on 1/1 As per vascular surgery no restriction for activity, dressing will be changed by MD on Friday.  #  Acute blood loss anemia due to left ankle hematoma, most likely due to trauma History of iron  deficiency anemia.  Iron  profile within normal range Hb 7.0, transfuse 1 unit of PRBC on 12/31 1/2 Hb 8.4 stable  Monitor H&H and transfuse if Hb <7.0   Elevated creatinine, most likely prerenal due to dehydration, decreased oral intake 1/3 Cr 1.27 1/3 started IV fluid for hydration, monitor renal function and urine output   # Hypotension, BP was low on arrival, improved s/p IV fluid Now hypertensive, S/p amlodipine  5 mg p.o. daily DC'd amlodipine  on 1/3, BP is fluctuating Use hydralazine  p.o. and /or IV according to parameters Monitor BP and titrate medications accordingly  # Polycythemia vera Chronic myelofibrosis -Continue current chemotherapy Jakafi    # Chronic ambulation impairment -Family reported patient has been using roller walker for the last 2 years with home PT.  However about 6 months ago, home PT was suspended due to a slow healing wound after left calf skin biopsy.  Since then the patient ambulation has slowly deteriorated, unsteady at baseline, had first fall 3 weeks ago and came to ED.  Patient was sent from ED to SNF/memory care unit for PT since.   Body mass index is 23.68 kg/m.  Interventions:  Diet: Regular diet DVT Prophylaxis: SCD, pharmacological prophylaxis contraindicated due to hematoma    Advance goals of care discussion: DNR/DNI-limited  Family Communication: family was present at bedside, at the time of interview.  The pt provided permission to discuss medical plan with the family. Opportunity was given to  ask question and all questions were answered satisfactorily.   Disposition:  Pt is from memory care unit, admitted with left ankle hematoma due to unwitnessed fall, s/p I&D done by vascular surgery, dressing will be changed today on 1/3 by vascular surgery.  which precludes a safe discharge. Discharge to SNF, when cleared by vascular surgery, most likely  in 1 to 2 days Follow TOC for placement   Subjective: No significant events overnight, patient received Dilaudid  for pain control before dressing change as per patient's daughter at bedside so patient was groggy and sleepy.  Could not participate well with the PT at bedside.   Physical Exam: General: NAD, lying comfortably Appear in no distress, affect appropriate Eyes: PERRLA ENT: Oral Mucosa Clear, moist  Neck: no JVD,  Cardiovascular: S1 and S2 Present, no Murmur,  Respiratory: good respiratory effort, Bilateral Air entry equal and Decreased, no Crackles, no wheezes Abdomen: Bowel Sound present, Soft and no tenderness,  Skin: no rashes Extremities: no Pedal edema, no calf tenderness. Left ankle hematoma s/p I&D, dressing CDI Neurologic: without any new focal findings Gait not checked due to patient safety concerns  Vitals:   04/03/23 1559 04/03/23 1954 04/03/23 1956 04/04/23 0757  BP: (!) 102/53 128/66 128/66 (!) 82/61  Pulse: 87 87 84 74  Resp: 16 17 17    Temp:  (!) 97.5 F (36.4 C) (!) 97.5 F (36.4 C) 97.7 F (36.5 C)  TempSrc:  Oral Oral   SpO2: (!) 86% (!) 86% 93% 100%  Weight:      Height:       No intake or output data in the 24 hours ending 04/04/23 1158  Filed Weights   03/31/23 0911  Weight: 55 kg    Data Reviewed: I have personally reviewed and interpreted daily labs, tele strips, imagings as discussed above. I reviewed all nursing notes, pharmacy notes, vitals, pertinent old records I have discussed plan of care as described above with RN and patient/family.  CBC: Recent Labs  Lab 03/31/23 0933 03/31/23 1452 03/31/23 2135 04/01/23 0423 04/02/23 0349 04/03/23 0537 04/04/23 0524  WBC 10.1  --   --  10.4 10.2 10.1 10.7*  NEUTROABS 6.5  --   --   --   --   --   --   HGB 8.9*   < > 7.1* 7.0* 8.5* 8.4* 7.7*  HCT 28.9*   < > 22.9* 22.1* 26.3* 26.1* 24.7*  MCV 89.8  --   --  87.7 79.5* 80.3 81.3  PLT 646*  --   --  495* 507* 502* 486*   < > =  values in this interval not displayed.   Basic Metabolic Panel: Recent Labs  Lab 03/31/23 0933 04/01/23 0831 04/02/23 0349 04/03/23 0537 04/04/23 0431  NA 137 136 136 135 133*  K 4.5 4.1 4.3 4.4 4.5  CL 102 104 104 103 101  CO2 24 25 22 22  20*  GLUCOSE 98 91 96 95 86  BUN 25* 25* 22 21 25*  CREATININE 1.09* 1.02* 1.06* 1.10* 1.27*  CALCIUM  9.0 8.8* 9.0 8.8* 8.6*  MG  --  2.0 1.9 2.0 1.9  PHOS  --  3.1 2.7 3.1 3.3    Studies: No results found.  Scheduled Meds:  amLODipine   5 mg Oral Daily   aspirin  EC  81 mg Oral Daily   citalopram   20 mg Oral Daily   cyanocobalamin   1,000 mcg Oral Daily   feeding supplement  237 mL Oral BID BM  ferrous sulfate   325 mg Oral Daily   hydrocortisone  cream  1 Application Topical BID   levothyroxine   25 mcg Oral Q0600   ruxolitinib  phosphate  10 mg Oral Daily   ruxolitinib  phosphate  5 mg Oral QHS   Continuous Infusions: PRN Meds: acetaminophen , hydrALAZINE , HYDROmorphone  (DILAUDID ) injection, ondansetron  **OR** ondansetron  (ZOFRAN ) IV, polyethylene glycol  Time spent: 40 minutes  Author: ELVAN SOR. MD [facility] 04/04/2023 11:58 AM  To reach On-call, see care teams to locate the attending and reach out to them via [email or web address]. If 7PM-7AM, please contact night-coverage If you still have difficulty reaching the attending provider, please page the [facility] (Director on Call) for [facility] on amion for assistance.

## 2023-04-05 ENCOUNTER — Encounter: Payer: Self-pay | Admitting: Student

## 2023-04-05 ENCOUNTER — Inpatient Hospital Stay: Payer: Medicare Other

## 2023-04-05 LAB — CBC
HCT: 22.4 % — ABNORMAL LOW (ref 36.0–46.0)
Hemoglobin: 7.1 g/dL — ABNORMAL LOW (ref 12.0–15.0)
MCH: 25.9 pg — ABNORMAL LOW (ref 26.0–34.0)
MCHC: 31.7 g/dL (ref 30.0–36.0)
MCV: 81.8 fL (ref 80.0–100.0)
Platelets: 445 10*3/uL — ABNORMAL HIGH (ref 150–400)
RBC: 2.74 MIL/uL — ABNORMAL LOW (ref 3.87–5.11)
RDW: 27.6 % — ABNORMAL HIGH (ref 11.5–15.5)
WBC: 9.2 10*3/uL (ref 4.0–10.5)
nRBC: 0.5 % — ABNORMAL HIGH (ref 0.0–0.2)

## 2023-04-05 LAB — BASIC METABOLIC PANEL
Anion gap: 9 (ref 5–15)
BUN: 27 mg/dL — ABNORMAL HIGH (ref 8–23)
CO2: 23 mmol/L (ref 22–32)
Calcium: 8.6 mg/dL — ABNORMAL LOW (ref 8.9–10.3)
Chloride: 105 mmol/L (ref 98–111)
Creatinine, Ser: 1.21 mg/dL — ABNORMAL HIGH (ref 0.44–1.00)
GFR, Estimated: 42 mL/min — ABNORMAL LOW (ref >60)
Glucose, Bld: 93 mg/dL (ref 70–99)
Potassium: 4 mmol/L (ref 3.5–5.1)
Sodium: 137 mmol/L (ref 135–145)

## 2023-04-05 LAB — HEMOGLOBIN AND HEMATOCRIT, BLOOD
HCT: 26.1 % — ABNORMAL LOW (ref 36.0–46.0)
Hemoglobin: 8.1 g/dL — ABNORMAL LOW (ref 12.0–15.0)

## 2023-04-05 MED ORDER — IPRATROPIUM-ALBUTEROL 0.5-2.5 (3) MG/3ML IN SOLN
3.0000 mL | RESPIRATORY_TRACT | Status: DC | PRN
  Administered 2023-04-05 – 2023-04-11 (×5): 3 mL via RESPIRATORY_TRACT
  Filled 2023-04-05 (×5): qty 3

## 2023-04-05 MED ORDER — ACETAMINOPHEN 325 MG PO TABS
650.0000 mg | ORAL_TABLET | Freq: Four times a day (QID) | ORAL | Status: DC
  Administered 2023-04-05 – 2023-04-11 (×20): 650 mg via ORAL
  Filled 2023-04-05 (×20): qty 2

## 2023-04-05 NOTE — TOC Progression Note (Signed)
 Transition of Care [facility]) - Progression Note    Patient Details  Name: Laura Wilcox MRN: [id number] Date of Birth: 08-Jun-1926  Transition of Care [facility] CM/SW Contact  Alvia Olam Fabry, RN Phone Number: 04/05/2023, 4:55 PM  Clinical Narrative:     TOC visited pt at bedside with available family her daughter. TOC spoke with the daughter concerning discharged disposition for SNF level of care. Daughter indicated the two facility she is interested ([facility] and [facility]). FL2 completed with bedsearch started.   PARSS# [id number]  TOC will continue to follow up on any offers or extend the search. No further inquires or request at this time.        Expected Discharge Plan and Services                                               Social Determinants of Health (SDOH) Interventions SDOH Screenings   Food Insecurity: No Food Insecurity (04/01/2023)  Housing: Low Risk  (04/01/2023)  Transportation Needs: No Transportation Needs (04/01/2023)  Utilities: Not At Risk (04/01/2023)  Alcohol  Screen: Low Risk  (06/18/2021)  Depression (PHQ2-9): High Risk (06/18/2021)  Social Connections: Patient Unable To Answer (04/01/2023)  Tobacco Use: Low Risk  (04/02/2023)    Readmission Risk Interventions    06/26/2021    4:26 PM  Readmission Risk Prevention Plan  Transportation Screening Complete  PCP or Specialist Appt within 5-7 Days Complete  Home Care Screening Complete  Medication Review (RN CM) Complete

## 2023-04-05 NOTE — Progress Notes (Signed)
 [facility] Progress Note  Patient: Laura Wilcox    FMW:[id number]  DOA: 03/31/2023     Date of Service: the patient was seen and examined on 04/05/2023  Chief Complaint  Patient presents with   Extremity Laceration   Brief hospital course: Kiani Wurtzel is a 89 y.o. female with PMH of Advanced dementia, polycythemia vera and chronic myelofibrosis on chemotherapy, chronic iron  deficiency anemia, chronic ambulation impairment, was found on the floor with a large left ankle hematoma in the morning on 03/31/23.   Patient does not remember what has happened, and this morning patient was found on the floor awake, with a large left inner ankle hematoma.  Patient does not remember whether she had a fall or hit anything with the left foot last night or this morning.  Family at bedside reported that the patient sustained a unwitnessed fall 3 weeks ago and hit her head with a large hematoma but no LOC.  At baseline, patient has dementia and family reported that the patient prefers to staying  dehydrated by avoiding drinking water so that does not have to change diaper On arrival in the ED, patient was found to be hypotensive blood pressure 75/35, none tachycardia nonhypoxic afebrile.  Blood work showed hemoglobin 8.9 compared to baseline 9.9, platelet 646, creatinine 1.0, BUN 25, glucose 98.   Patient was given 1 L of IV bolus in the ED.  ED physician tried to manually extract hematoma and clots from the left ankle.   Assessment and Plan:  # Left ankle hematoma Patient was found on the floor, significant dementia, poor historian.  Hematoma most likely secondary to unwitnessed trauma and fall. S/p manual extraction of blood clots done by ED physician, left ankle was wrapped with pressure dressing. 1/2 Resumed Aspirin  vascular surgery consulted, s/p I&D under general anesthesia done on 1/1 As per vascular surgery no restriction for activity, dressing will changed 1/3 by MD, and will be  changed over weekend by MD, further dressings changed by [facility]   # Acute blood loss anemia due to left ankle hematoma, most likely due to trauma History of iron  deficiency anemia.  Iron  profile within normal range Hb 7.0, transfuse 1 unit of PRBC on 12/31 1/2 Hb 8.4 stable  Hgb 7.1 on AM labs, >8 on repeat Monitor H&H and transfuse if Hb <7.0   Elevated creatinine, most likely prerenal due to dehydration, decreased oral intake 1/3 Cr 1.27>1.21 1/3 started IV fluid for hydration, monitor renal function and urine output Stable will continue to monitor      Latest Ref Rng & Units 04/05/2023    4:34 AM 04/04/2023    4:31 AM 04/03/2023    5:37 AM  BMP  Glucose 70 - 99 mg/dL 93  86  95   BUN 8 - 23 mg/dL 27  25  21    Creatinine 0.44 - 1.00 mg/dL 8.78  8.72  8.89   Sodium 135 - 145 mmol/L 137  133  135   Potassium 3.5 - 5.1 mmol/L 4.0  4.5  4.4   Chloride 98 - 111 mmol/L 105  101  103   CO2 22 - 32 mmol/L 23  20  22    Calcium  8.9 - 10.3 mg/dL 8.6  8.6  8.8      # Hypotension,   Per patient's family LUE with low blood pressures chronically, though this was never worked up. RUE with normal blood pressures. Continue to hold home medications. Use hydralazine  p.o. and /or IV according to  parameters Monitor BP and titrate medications accordingly  # Polycythemia vera Chronic myelofibrosis -Continue current chemotherapy Jakafi    # Chronic ambulation impairment -Family reported patient has been using roller walker for the last 2 years with home PT.  However about 6 months ago, home PT was suspended due to a slow healing wound after left calf skin biopsy.  Since then the patient ambulation has slowly deteriorated, unsteady at baseline, had first fall 3 weeks ago and came to ED.  Patient was sent from ED to SNF/memory care unit for PT since.   Body mass index is 23.68 kg/m.  Interventions:  Diet: Regular diet DVT Prophylaxis: SCD, pharmacological prophylaxis contraindicated due to hematoma     Advance goals of care discussion: DNR/DNI-limited  Family Communication: family was present at bedside, at the time of interview.  The pt provided permission to discuss medical plan with the family. Opportunity was given to ask question and all questions were answered satisfactorily.   Disposition:  Pt is from memory care unit, admitted with left ankle hematoma due to unwitnessed fall, s/p I&D done by vascular surgery, dressing will be changed today on 1/3 by vascular surgery and over the weekend  which precludes a safe discharge. Discharge to SNF, when cleared by vascular surgery, most likely in 1 to 2 days Follow TOC for placement   Subjective: No significant events overnight, some pain in left lower extremity but otherwise feeling okay.    Physical Exam:  Constitutional: In no distress.  Cardiovascular: Normal rate, regular rhythm. No lower extremity edema  Pulmonary: Non labored breathing on room air, no wheezing or rales.  No lower extremity edema Abdominal: Soft. Normal bowel sounds. Non distended and non tender Musculoskeletal: Normal range of motion.     Neurological: Alert and oriented to person, place, and time. Non focal  Skin: Skin is warm and dry. LLE bandaged, intact   Vitals:   04/04/23 1622 04/04/23 2027 04/05/23 0451 04/05/23 0820  BP: (!) 66/54 (!) 70/50 (!) 74/58 (!) 135/58  Pulse: 87 85 77 72  Resp:  16 16 20   Temp: 98.8 F (37.1 C) 98.2 F (36.8 C) 98.5 F (36.9 C) 98 F (36.7 C)  TempSrc:    Oral  SpO2: 92% 96% (!) 89% 95%  Weight:      Height:        Intake/Output Summary (Last 24 hours) at 04/05/2023 1934 Last data filed at 04/05/2023 1030 Gross per 24 hour  Intake 506.88 ml  Output 275 ml  Net 231.88 ml    Filed Weights   03/31/23 0911  Weight: 55 kg    Data Reviewed: I have personally reviewed and interpreted daily labs, tele strips, imagings as discussed above. I reviewed all nursing notes, pharmacy notes, vitals, pertinent old  records   CBC: Recent Labs  Lab 03/31/23 0933 03/31/23 1452 04/01/23 0423 04/02/23 0349 04/03/23 0537 04/04/23 0524 04/05/23 0434 04/05/23 1244  WBC 10.1  --  10.4 10.2 10.1 10.7* 9.2  --   NEUTROABS 6.5  --   --   --   --   --   --   --   HGB 8.9*   < > 7.0* 8.5* 8.4* 7.7* 7.1* 8.1*  HCT 28.9*   < > 22.1* 26.3* 26.1* 24.7* 22.4* 26.1*  MCV 89.8  --  87.7 79.5* 80.3 81.3 81.8  --   PLT 646*  --  495* 507* 502* 486* 445*  --    < > = values in  this interval not displayed.   Basic Metabolic Panel: Recent Labs  Lab 04/01/23 0831 04/02/23 0349 04/03/23 0537 04/04/23 0431 04/05/23 0434  NA 136 136 135 133* 137  K 4.1 4.3 4.4 4.5 4.0  CL 104 104 103 101 105  CO2 25 22 22  20* 23  GLUCOSE 91 96 95 86 93  BUN 25* 22 21 25* 27*  CREATININE 1.02* 1.06* 1.10* 1.27* 1.21*  CALCIUM  8.8* 9.0 8.8* 8.6* 8.6*  MG 2.0 1.9 2.0 1.9  --   PHOS 3.1 2.7 3.1 3.3  --     Studies: US  ARTERIAL ABI (SCREENING LOWER EXTREMITY) Result Date: 04/05/2023 CLINICAL DATA:  Delayed wound healing EXAM: NONINVASIVE PHYSIOLOGIC VASCULAR STUDY OF BILATERAL LOWER EXTREMITIES TECHNIQUE: Evaluation of both lower extremities were performed at rest, including calculation of ankle-brachial indices with single level pressure measurements and doppler recording. COMPARISON:  None available. FINDINGS: Right ABI:  0.65 Left ABI:  0.46 Right Lower Extremity: Monophasic waveforms of the posterior tibial and dorsalis pedis arteries. Left Lower Extremity: Monophasic waveforms of the posterior tibial dorsalis pedis arteries. < 0.5 Severe PAD IMPRESSION: 1. Severe peripheral arterial disease of the left lower extremity. 2. Moderate peripheral arterial disease of the right lower extremity. 3. Significant difference in BP between upper extremities with left brachial pressure measuring 77 mm Hg and right brachial pressure measuring 101 mm Hg. Findings suspicious for left subclavian/axillary artery stenosis. Repeat bilateral upper  extremity BP measurements should be obtained two confirmed this is reproducible. Electronically Signed   By: Aliene Lloyd M.D.   On: 04/05/2023 11:55    Scheduled Meds:  acetaminophen   650 mg Oral Q6H   aspirin  EC  81 mg Oral Daily   citalopram   20 mg Oral Daily   cyanocobalamin   1,000 mcg Oral Daily   feeding supplement  237 mL Oral BID BM   ferrous sulfate   325 mg Oral Daily   hydrocortisone  cream  1 Application Topical BID   levothyroxine   25 mcg Oral Q0600   Continuous Infusions: PRN Meds: acetaminophen , hydrALAZINE , hydrALAZINE , ipratropium-albuterol , ondansetron  **OR** ondansetron  (ZOFRAN ) IV, polyethylene glycol  Time spent: 40 minutes  Author: Alban Pepper. MD [facility] 04/05/2023 7:34 PM  To reach On-call, see care teams to locate the attending and reach out to them via [email or web address]. If 7PM-7AM, please contact night-coverage If you still have difficulty reaching the attending provider, please page the [facility] (Director on Call) for [facility] on amion for assistance.

## 2023-04-05 NOTE — Plan of Care (Signed)
  Problem: Education: Goal: Knowledge of General Education information will improve Description: Including pain rating scale, medication(s)/side effects and non-pharmacologic comfort measures Outcome: Progressing   Problem: Clinical Measurements: Goal: Ability to maintain clinical measurements within normal limits will improve Outcome: Progressing Goal: Will remain free from infection Outcome: Progressing Goal: Diagnostic test results will improve Outcome: Progressing Goal: Respiratory complications will improve Outcome: Progressing Goal: Cardiovascular complication will be avoided Outcome: Progressing   Problem: Activity: Goal: Risk for activity intolerance will decrease Outcome: Progressing   Problem: Safety: Goal: Ability to remain free from injury will improve Outcome: Progressing   Problem: Skin Integrity: Goal: Risk for impaired skin integrity will decrease Outcome: Progressing   

## 2023-04-05 NOTE — NC FL2 (Signed)
 [place]  MEDICAID FL2 LEVEL OF CARE FORM     IDENTIFICATION  Patient Name: Laura Wilcox Birthdate: [birth year] Sex: female Admission Date (Current Location): 03/31/2023  [facility] [place] [facility] [place] Number:  Chiropodist and Address:  [facility], [address] [place], [place] [address]      Provider Number: [id number]  Attending Physician Name and Address:  Franchot Novel, MD  Relative Name and Phone Number:  [name] Roulette [phone]    Current Level of Care: SNF Recommended Level of Care: Skilled Nursing Facility Prior Approval Number:    Date Approved/Denied:   PASRR Number: [id number]  Discharge Plan: SNF    Current Diagnoses: Patient Active Problem List   Diagnosis Date Noted   Hematoma of left ankle 03/31/2023   Hematoma 03/31/2023   SCC (squamous cell carcinoma) 03/19/2022   Skin tear of lower leg without complication, right, initial encounter 09/24/2021   Sepsis (HCC) 09/24/2021   Leg wound, right 09/24/2021   Encounter for antineoplastic chemotherapy 09/21/2021   Senile dementia with acute confusional state, without behavioral disturbance (HCC) 06/26/2021   Depression 06/26/2021   Weakness 06/26/2021   UTI (urinary tract infection) 06/25/2021   Myelofibrosis (HCC) 01/31/2020   Other pancytopenia (HCC) 01/31/2020   Moderate protein-calorie malnutrition (HCC)    Squamous cell cancer of skin of left cheek 01/16/2020   Chronic kidney disease, stage 3b (HCC) 03/11/2019   Secondary hyperparathyroidism of renal origin (HCC) 03/11/2019   Anemia in chronic kidney disease 03/11/2019   Elevated ferritin 02/18/2018   Goals of care, counseling/discussion 07/09/2017   Polycythemia rubra vera (HCC) 06/02/2017   Protein-calorie malnutrition, moderate (HCC) 06/02/2017   Advanced care planning/counseling discussion 06/02/2017   Age-related cognitive decline 06/02/2017    Orientation RESPIRATION BLADDER Height & Weight     Self,  Place  Normal Continent Weight: 55 kg Height:  5' (152.4 cm)  BEHAVIORAL SYMPTOMS/MOOD NEUROLOGICAL BOWEL NUTRITION STATUS      Continent Diet  AMBULATORY STATUS COMMUNICATION OF NEEDS Skin   Limited Assist Verbally Other (Comment) (left ankle with ace wrap and xerofoam)                       Personal Care Assistance Level of Assistance  Bathing, Feeding, Dressing Bathing Assistance: Limited assistance Feeding assistance: Limited assistance Dressing Assistance: Limited assistance     Functional Limitations Info             SPECIAL CARE FACTORS FREQUENCY  PT (By licensed PT), OT (By licensed OT)     PT Frequency: 5 X Weekly OT Frequency: 5 X Weekly            Contractures      Additional Factors Info                  Current Medications (04/05/2023):  This is the current hospital active medication list Current Facility-Administered Medications  Medication Dose Route Frequency Provider Last Rate Last Admin   acetaminophen  (TYLENOL ) tablet 325-650 mg  325-650 mg Oral Q6H PRN Laurita Manor T, MD   650 mg at 04/05/23 1205   acetaminophen  (TYLENOL ) tablet 650 mg  650 mg Oral Q6H Franchot Novel, MD       aspirin  EC tablet 81 mg  81 mg Oral Daily Von Bellis, MD   81 mg at 04/05/23 1014   citalopram  (CELEXA ) tablet 20 mg  20 mg Oral Daily Laurita Manor T, MD   20 mg at 04/05/23 1014  cyanocobalamin  (VITAMIN B12) tablet 1,000 mcg  1,000 mcg Oral Daily Laurita Manor T, MD   1,000 mcg at 04/05/23 1014   feeding supplement (ENSURE ENLIVE / ENSURE PLUS) liquid 237 mL  237 mL Oral BID BM Laurita Manor T, MD   237 mL at 04/05/23 1022   ferrous sulfate  tablet 325 mg  325 mg Oral Daily Laurita Manor T, MD   325 mg at 04/05/23 1014   hydrALAZINE  (APRESOLINE ) injection 10 mg  10 mg Intravenous Q6H PRN Von Bellis, MD       hydrALAZINE  (APRESOLINE ) tablet 50 mg  50 mg Oral Q6H PRN Von Bellis, MD       hydrocortisone  cream 1 % 1 Application  1 Application Topical BID Laurita Manor T, MD   1 Application at 04/05/23 1014   levothyroxine  (SYNTHROID ) tablet 25 mcg  25 mcg Oral Q0600 Von Bellis, MD   25 mcg at 04/05/23 0546   ondansetron  (ZOFRAN ) tablet 4 mg  4 mg Oral Q6H PRN Laurita Manor DASEN, MD       Or   ondansetron  (ZOFRAN ) injection 4 mg  4 mg Intravenous Q6H PRN Laurita Manor T, MD       polyethylene glycol (MIRALAX  / GLYCOLAX ) packet 17 g  17 g Oral Daily PRN Laurita Manor DASEN, MD         Discharge Medications: Please see discharge summary for a list of discharge medications.  Relevant Imaging Results:  Relevant Lab Results:   Additional Information SSN: [id number]  Alvia Olam Fabry, RN

## 2023-04-05 NOTE — Plan of Care (Signed)
  Problem: Education: Goal: Knowledge of General Education information will improve Description: Including pain rating scale, medication(s)/side effects and non-pharmacologic comfort measures Outcome: Progressing   Problem: Health Behavior/Discharge Planning: Goal: Ability to manage health-related needs will improve Outcome: Progressing   Problem: Clinical Measurements: Goal: Ability to maintain clinical measurements within normal limits will improve Outcome: Progressing   Problem: Activity: Goal: Risk for activity intolerance will decrease Outcome: Progressing   Problem: Nutrition: Goal: Adequate nutrition will be maintained Outcome: Progressing   Problem: Coping: Goal: Level of anxiety will decrease Outcome: Progressing   Problem: Elimination: Goal: Will not experience complications related to bowel motility Outcome: Progressing   Problem: Pain Management: Goal: General experience of comfort will improve Outcome: Progressing   Problem: Safety: Goal: Ability to remain free from injury will improve Outcome: Progressing   Problem: Skin Integrity: Goal: Risk for impaired skin integrity will decrease Outcome: Progressing

## 2023-04-05 NOTE — Progress Notes (Signed)
 Physical Therapy Treatment Patient Details Name: Laura Wilcox MRN: [id number] DOB: 1926/04/20 Today's Date: 04/05/2023   History of Present Illness Pt. is a 89 y.o.  female who was admitted with a large left ankle  hematoma, and laceration sustained during a fall at  her ALF. PMHx includes: Advanced Dementia, Polycythemia Vera, Chronic Myelofibrosis on Chemotherapy, chronic ambulation impairment,    PT Comments  Pt was long sitting in bed upon arrival with supportive daughter nd son at bedside. Pt is very HOH + pleasantly confused. She does consistently follow command and remains cooperative throughout. Per daughter  Her BP readings in LUE are always very low. Per her primary MD, RUE readings are the only accurate readings. When BP checked on RUE 174/94. MD and RN made aware. Pt c/o LLE wound pain however still tolerated getting OOB and standing 1 x. Unfortunately, session limited by pt urinating on floor upon standing. Poor standing tolerance overall due to SOB/wheezing noted. Author assisted pt back to bed and repsoitoned her to [facility]. Acute PT will continue to follow and progress as able. DC recs remain appropriate. MD/care team updated. Bed alarm in place post session.     If plan is discharge home, recommend the following: A lot of help with walking and/or transfers;A lot of help with bathing/dressing/bathroom;Assistance with cooking/housework;Direct supervision/assist for medications management;Direct supervision/assist for financial management;Assist for transportation;Help with stairs or ramp for entrance;Supervision due to cognitive status     Equipment Recommendations  Other (comment) (Defer to next level of care)       Precautions / Restrictions Precautions Precautions: Fall Restrictions Weight Bearing Restrictions Per Provider Order: No     Mobility  Bed Mobility Overal bed mobility: Needs Assistance Bed Mobility: Supine to Sit, Sit to Supine  Supine to sit: Min assist, Mod  assist Sit to supine: Mod assist, Used rails General bed mobility comments: increased time to perform. pt requires step by step vcs    Transfers Overall transfer level: Needs assistance Equipment used: Rolling walker (2 wheels) Transfers: Sit to/from Stand Sit to Stand: Mod assist  General transfer comment: pt stood 1 x EOB x ~ 20 sec. urinated on floor in static standing  and c/o LLE pain.    Ambulation/Gait  General Gait Details: unable to safely progress away from EOB. pt weezing and LLE pain.    Balance Overall balance assessment: Needs assistance Sitting-balance support: Feet supported, Bilateral upper extremity supported Sitting balance-Leahy Scale: Fair Sitting balance - Comments: close SBA in sitting   Standing balance support: Bilateral upper extremity supported, During functional activity, Reliant on assistive device for balance Standing balance-Leahy Scale: Fair Standing balance comment: CGA assist for safety in static standing. poor standing posture     Cognition Arousal: Lethargic Behavior During Therapy: WFL for tasks assessed/performed Overall Cognitive Status: History of cognitive impairments - at baseline    General Comments: Pt is A but pleasantly confused.per daughter, has baseline cognition deficits and pt is close to her baseline currently               Pertinent Vitals/Pain Pain Assessment Pain Assessment: 0-10 Pain Score: 7  Pain Location: LLE Pain Descriptors / Indicators: Discomfort Pain Intervention(s): Monitored during session, Limited activity within patient's tolerance, Premedicated before session     PT Goals (current goals can now be found in the care plan section) Acute Rehab PT Goals Patient Stated Goal: none stated Progress towards PT goals: Progressing toward goals    Frequency    Min 1X/week  Co-evaluation     PT goals addressed during session: Mobility/safety with mobility;Balance        AM-PAC PT 6  Clicks Mobility   Outcome Measure  Help needed turning from your back to your side while in a flat bed without using bedrails?: A Lot Help needed moving from lying on your back to sitting on the side of a flat bed without using bedrails?: A Lot Help needed moving to and from a bed to a chair (including a wheelchair)?: A Lot Help needed standing up from a chair using your arms (e.g., wheelchair or bedside chair)?: A Lot Help needed to walk in hospital room?: A Lot Help needed climbing 3-5 steps with a railing? : A Lot 6 Click Score: 12    End of Session Equipment Utilized During Treatment: Oxygen Activity Tolerance: Patient limited by fatigue;Patient limited by pain Patient left: in bed;with bed alarm set;with family/visitor present;with call bell/phone within reach Nurse Communication: Mobility status PT Visit Diagnosis: Other abnormalities of gait and mobility (R26.89);Muscle weakness (generalized) (M62.81);Difficulty in walking, not elsewhere classified (R26.2);Unsteadiness on feet (R26.81)     Time: 8387-8366 PT Time Calculation (min) (ACUTE ONLY): 21 min  Charges:    $Therapeutic Activity: 8-22 mins PT General Charges $$ ACUTE PT VISIT: 1 Visit                     Rankin Essex PTA 04/05/23, 4:57 PM

## 2023-04-05 NOTE — Plan of Care (Signed)

## 2023-04-06 LAB — BASIC METABOLIC PANEL
Anion gap: 5 (ref 5–15)
BUN: 31 mg/dL — ABNORMAL HIGH (ref 8–23)
CO2: 26 mmol/L (ref 22–32)
Calcium: 8.6 mg/dL — ABNORMAL LOW (ref 8.9–10.3)
Chloride: 107 mmol/L (ref 98–111)
Creatinine, Ser: 1.07 mg/dL — ABNORMAL HIGH (ref 0.44–1.00)
GFR, Estimated: 48 mL/min — ABNORMAL LOW (ref >60)
Glucose, Bld: 102 mg/dL — ABNORMAL HIGH (ref 70–99)
Potassium: 4.2 mmol/L (ref 3.5–5.1)
Sodium: 138 mmol/L (ref 135–145)

## 2023-04-06 LAB — CBC
HCT: 22.6 % — ABNORMAL LOW (ref 36.0–46.0)
HCT: 24.7 % — ABNORMAL LOW (ref 36.0–46.0)
Hemoglobin: 7 g/dL — ABNORMAL LOW (ref 12.0–15.0)
Hemoglobin: 7.8 g/dL — ABNORMAL LOW (ref 12.0–15.0)
MCH: 26 pg (ref 26.0–34.0)
MCH: 26.2 pg (ref 26.0–34.0)
MCHC: 31 g/dL (ref 30.0–36.0)
MCHC: 31.6 g/dL (ref 30.0–36.0)
MCV: 84 fL (ref 80.0–100.0)
MCV: 82.9 fL (ref 80.0–100.0)
Platelets: 447 10*3/uL — ABNORMAL HIGH (ref 150–400)
Platelets: 475 10*3/uL — ABNORMAL HIGH (ref 150–400)
RBC: 2.69 MIL/uL — ABNORMAL LOW (ref 3.87–5.11)
RBC: 2.98 MIL/uL — ABNORMAL LOW (ref 3.87–5.11)
RDW: 27.7 % — ABNORMAL HIGH (ref 11.5–15.5)
RDW: 27.6 % — ABNORMAL HIGH (ref 11.5–15.5)
WBC: 9.7 10*3/uL (ref 4.0–10.5)
WBC: 10.3 10*3/uL (ref 4.0–10.5)
nRBC: 0.7 % — ABNORMAL HIGH (ref 0.0–0.2)
nRBC: 0.8 % — ABNORMAL HIGH (ref 0.0–0.2)

## 2023-04-06 NOTE — Progress Notes (Signed)
    Subjective  - POD # 4, status post I&D of left ankle  No complaints this morning.  Daughters at the bedside   Physical Exam:  Dressing was changed.  There is a healthy granulation tissue base.  No concern infection       Assessment/Plan:  POD # 4  -Continue with daily dressing changes with Xeroform. -Keep leg  elevated -I am trying to arrange a outpatient appointment with Dr. Lowery (plastic surgery in [place]) to continue wound care.  The patient has previously had wound care with her. -Patient can be discharged with disposition finalized.    Wells Lynde Ludwig 04/06/2023 12:12 PM --  Vitals:   04/06/23 0806 04/06/23 1152  BP: (!) 155/66 (!) 145/73  Pulse: 74 (!) 58  Resp: 16 20  Temp: 98.6 F (37 C) 98.4 F (36.9 C)  SpO2: 99% 100%    Intake/Output Summary (Last 24 hours) at 04/06/2023 1212 Last data filed at 04/06/2023 0817 Gross per 24 hour  Intake --  Output 450 ml  Net -450 ml     Laboratory CBC    Component Value Date/Time   WBC 10.3 04/06/2023 1040   HGB 7.8 (L) 04/06/2023 1040   HGB 10.3 (L) 02/25/2023 1347   HGB 8.1 (L) 06/18/2021 1514   HCT 24.7 (L) 04/06/2023 1040   HCT 25.2 (L) 06/18/2021 1514   PLT 475 (H) 04/06/2023 1040   PLT 677 (H) 02/25/2023 1347   PLT 452 (H) 06/18/2021 1514    BMET    Component Value Date/Time   NA 138 04/06/2023 0545   NA 134 06/18/2021 1514   K 4.2 04/06/2023 0545   CL 107 04/06/2023 0545   CO2 26 04/06/2023 0545   GLUCOSE 102 (H) 04/06/2023 0545   BUN 31 (H) 04/06/2023 0545   BUN 19 06/18/2021 1514   CREATININE 1.07 (H) 04/06/2023 0545   CREATININE 1.41 (H) 12/30/2022 1118   CALCIUM  8.6 (L) 04/06/2023 0545   GFRNONAA 48 (L) 04/06/2023 0545   GFRNONAA 35 (L) 12/30/2022 1118   GFRAA 49 (L) 12/27/2019 0935    COAG Lab Results  Component Value Date   INR 1.0 03/31/2023   INR 1.1 10/22/2020   INR 0.92 12/03/2017   No results found for: PTT  Antibiotics Anti-infectives (From admission,  onward)    None        V. Malvina Serene CLORE, M.D., [facility] [place] Office: [phone] Pager:  [phone]

## 2023-04-06 NOTE — Plan of Care (Signed)

## 2023-04-06 NOTE — Progress Notes (Signed)
 [facility] Progress Note  Patient: Laura Wilcox    FMW:[id number]  DOA: 03/31/2023     Date of Service: the patient was seen and examined on 04/06/2023  Chief Complaint  Patient presents with   Extremity Laceration   Brief hospital course: Laura Wilcox is a 89 y.o. female with PMH of Advanced dementia, polycythemia vera and chronic myelofibrosis on chemotherapy, chronic iron  deficiency anemia, chronic ambulation impairment, was found on the floor with a large left ankle hematoma in the morning on 03/31/23.   Patient does not remember what has happened, and this morning patient was found on the floor awake, with a large left inner ankle hematoma.  Patient does not remember whether she had a fall or hit anything with the left foot last night or this morning.  Family at bedside reported that the patient sustained a unwitnessed fall 3 weeks ago and hit her head with a large hematoma but no LOC.  At baseline, patient has dementia and family reported that the patient prefers to staying  dehydrated by avoiding drinking water so that does not have to change diaper On arrival in the ED, patient was found to be hypotensive blood pressure 75/35, none tachycardia nonhypoxic afebrile.  Blood work showed hemoglobin 8.9 compared to baseline 9.9, platelet 646, creatinine 1.0, BUN 25, glucose 98.   Patient was given 1 L of IV bolus in the ED.  ED physician tried to manually extract hematoma and clots from the left ankle.   Assessment and Plan:  # Left ankle hematoma Patient was found on the floor, significant dementia, poor historian.  Hematoma most likely secondary to unwitnessed trauma and fall. S/p manual extraction of blood clots done by ED physician, left ankle was wrapped with pressure dressing. 1/2 Resumed Aspirin  vascular surgery consulted, s/p I&D under general anesthesia done on 1/1 As per vascular surgery no restriction for activity, dressing changed 1/3, 1/5 by MD PT/OT  recommending SNF   # Acute blood loss anemia due to left ankle hematoma, most likely due to trauma History of iron  deficiency anemia.  Iron  profile within normal range Hb 7.0, transfuse 1 unit of PRBC on 12/31 Hgb stable  Monitor H&H and transfuse if Hb <7.0   Elevated creatinine, most likely prerenal due to dehydration, decreased oral intake 1/3 Cr 1.27>1.21>1.07 1/3 s/p IV fluids Improving Continue to monitor with daily BMP      Latest Ref Rng & Units 04/06/2023    5:45 AM 04/05/2023    4:34 AM 04/04/2023    4:31 AM  BMP  Glucose 70 - 99 mg/dL 897  93  86   BUN 8 - 23 mg/dL 31  27  25    Creatinine 0.44 - 1.00 mg/dL 8.92  8.78  8.72   Sodium 135 - 145 mmol/L 138  137  133   Potassium 3.5 - 5.1 mmol/L 4.2  4.0  4.5   Chloride 98 - 111 mmol/L 107  105  101   CO2 22 - 32 mmol/L 26  23  20    Calcium  8.9 - 10.3 mg/dL 8.6  8.6  8.6      # Hypotension Resolved   Per patient's family LUE with low blood pressures chronically, though this was never worked up. RUE with normal blood pressures. Continue to hold home medications.    # Polycythemia vera Chronic myelofibrosis -Current chemotherapy Jakafi  not on formulary held during hospitalization    # Chronic ambulation impairment -Family reported patient has been using roller walker for  the last 2 years with home PT.  However about 6 months ago, home PT was suspended due to a slow healing wound after left calf skin biopsy.  Since then the patient ambulation has slowly deteriorated, unsteady at baseline, had first fall 3 weeks ago and came to ED.  Patient was sent from ED to SNF/memory care unit for PT since.   Body mass index is 23.68 kg/m.  Interventions:  Diet: Regular diet DVT Prophylaxis: SCD, pharmacological prophylaxis contraindicated due to hematoma    Advance goals of care discussion: DNR/DNI-limited  Family Communication: family was present at bedside, at the time of interview.    Disposition:  Pt is from memory care  unit, admitted with left ankle hematoma due to unwitnessed fall, s/p I&D done by vascular surgery, dressing will be changed today on 1/3 by vascular surgery and over the weekend  which precludes a safe discharge. Medically stable for discharge, And will need SNF   Subjective: No significant events overnight.  Continues to have some pain in the left lower extremity.   Physical Exam:  Physical Exam  Constitutional: In no distress.  Cardiovascular: Normal rate, regular rhythm. No lower extremity edema  Pulmonary: Non labored breathing on [place], no wheezing or rales.   Abdominal: Soft. Normal bowel sounds. Non distended and non tender Musculoskeletal: Normal range of motion.     Skin: Skin is warm and dry.  LLE bandaged, intact  Vitals:   04/06/23 0428 04/06/23 0806 04/06/23 1152 04/06/23 1617  BP: (!) 130/47 (!) 155/66 (!) 145/73 (!) 136/52  Pulse: 72 74 (!) 58 77  Resp: 18 16 20 16   Temp: 97.8 F (36.6 C) 98.6 F (37 C) 98.4 F (36.9 C) 98.6 F (37 C)  TempSrc: Oral     SpO2: 92% 99% 100% (!) 88%  Weight:      Height:        Intake/Output Summary (Last 24 hours) at 04/06/2023 1701 Last data filed at 04/06/2023 0817 Gross per 24 hour  Intake --  Output 450 ml  Net -450 ml    Filed Weights   03/31/23 0911  Weight: 55 kg    Data Reviewed: I have personally reviewed all labs and imaging studies.  CBC: Recent Labs  Lab 03/31/23 0933 03/31/23 1452 04/03/23 0537 04/04/23 0524 04/05/23 0434 04/05/23 1244 04/06/23 0545 04/06/23 1040  WBC 10.1   < > 10.1 10.7* 9.2  --  9.7 10.3  NEUTROABS 6.5  --   --   --   --   --   --   --   HGB 8.9*   < > 8.4* 7.7* 7.1* 8.1* 7.0* 7.8*  HCT 28.9*   < > 26.1* 24.7* 22.4* 26.1* 22.6* 24.7*  MCV 89.8   < > 80.3 81.3 81.8  --  84.0 82.9  PLT 646*   < > 502* 486* 445*  --  447* 475*   < > = values in this interval not displayed.   Basic Metabolic Panel: Recent Labs  Lab 04/01/23 0831 04/02/23 0349 04/03/23 0537 04/04/23 0431  04/05/23 0434 04/06/23 0545  NA 136 136 135 133* 137 138  K 4.1 4.3 4.4 4.5 4.0 4.2  CL 104 104 103 101 105 107  CO2 25 22 22  20* 23 26  GLUCOSE 91 96 95 86 93 102*  BUN 25* 22 21 25* 27* 31*  CREATININE 1.02* 1.06* 1.10* 1.27* 1.21* 1.07*  CALCIUM  8.8* 9.0 8.8* 8.6* 8.6* 8.6*  MG 2.0 1.9  2.0 1.9  --   --   PHOS 3.1 2.7 3.1 3.3  --   --     Studies: No results found.   Scheduled Meds:  acetaminophen   650 mg Oral Q6H   aspirin  EC  81 mg Oral Daily   citalopram   20 mg Oral Daily   cyanocobalamin   1,000 mcg Oral Daily   feeding supplement  237 mL Oral BID BM   ferrous sulfate   325 mg Oral Daily   hydrocortisone  cream  1 Application Topical BID   levothyroxine   25 mcg Oral Q0600   Continuous Infusions: PRN Meds: acetaminophen , hydrALAZINE , hydrALAZINE , ipratropium-albuterol , ondansetron  **OR** ondansetron  (ZOFRAN ) IV, polyethylene glycol  Time spent: 40 minutes  Author: Alban Pepper. MD [facility] 04/06/2023 5:01 PM  To reach On-call, see care teams to locate the attending and reach out to them via [email or web address]. If 7PM-7AM, please contact night-coverage If you still have difficulty reaching the attending provider, please page the [facility] (Director on Call) for [facility] on amion for assistance.

## 2023-04-07 ENCOUNTER — Inpatient Hospital Stay: Payer: PRIVATE HEALTH INSURANCE

## 2023-04-07 DIAGNOSIS — T148XXA Other injury of unspecified body region, initial encounter: Secondary | ICD-10-CM | POA: Diagnosis not present

## 2023-04-07 NOTE — Plan of Care (Signed)
  Problem: Clinical Measurements: Goal: Will remain free from infection Outcome: Progressing   Problem: Activity: Goal: Risk for activity intolerance will decrease Outcome: Progressing   Problem: Nutrition: Goal: Adequate nutrition will be maintained Outcome: Progressing   Problem: Elimination: Goal: Will not experience complications related to bowel motility Outcome: Progressing Goal: Will not experience complications related to urinary retention Outcome: Progressing   Problem: Pain Management: Goal: General experience of comfort will improve Outcome: Progressing   Problem: Skin Integrity: Goal: Risk for impaired skin integrity will decrease Outcome: Progressing

## 2023-04-07 NOTE — Progress Notes (Signed)
 Progress Note   Patient: Laura Wilcox FMW:[id number] DOB: [birth year] DOA: 03/31/2023     6 DOS: the patient was seen and examined on 04/07/2023    Brief hospital course: Disaya Walt is a 89 y.o. female with PMH of Advanced dementia, polycythemia vera and chronic myelofibrosis on chemotherapy, chronic iron  deficiency anemia, chronic ambulation impairment, was found on the floor with a large left ankle hematoma in the morning on 03/31/23.   Patient does not remember what has happened, and this morning patient was found on the floor awake, with a large left inner ankle hematoma.  Patient does not remember whether she had a fall or hit anything with the left foot last night or this morning.  Family at bedside reported that the patient sustained a unwitnessed fall 3 weeks ago and hit her head with a large hematoma but no LOC.  At baseline, patient has dementia and family reported that the patient prefers to staying  dehydrated by avoiding drinking water so that does not have to change diaper On arrival in the ED, patient was found to be hypotensive blood pressure 75/35, none tachycardia nonhypoxic afebrile.  Blood work showed hemoglobin 8.9 compared to baseline 9.9, platelet 646, creatinine 1.0, BUN 25, glucose 98.   Patient was given 1 L of IV bolus in the ED.  ED physician tried to manually extract hematoma and clots from the left ankle.     Assessment and Plan:   # Left ankle hematoma Patient was found on the floor, significant dementia, poor historian.  Hematoma most likely secondary to unwitnessed trauma and fall. S/p manual extraction of blood clots done by ED physician, left ankle was wrapped with pressure dressing. 1/2 Resumed Aspirin  vascular surgery consulted, s/p I&D under general anesthesia done on 1/1 As per vascular surgery no restriction for activity, dressing changed 1/3, 1/5 by MD PT/OT recommending SNF, TOC working on this     # Acute blood loss anemia due to left  ankle hematoma, most likely due to trauma History of iron  deficiency anemia.  Iron  profile within normal range Hb 7.0, transfuse 1 unit of PRBC on 12/31 Hgb stable  Continue to monitor H&H   Elevated creatinine, most likely prerenal due to dehydration, decreased oral intake 1/3 Cr 1.27>1.21>1.07 1/3 s/p IV fluids Monitor renal function closely     # Hypotension Resolved  Continue to monitor closely   # Polycythemia vera Chronic myelofibrosis -Current chemotherapy Jakafi  not on formulary held during hospitalization    # Chronic ambulation impairment -Family reported patient has been using roller walker for the last 2 years with home PT.  However about 6 months ago, home PT was suspended due to a slow healing wound after left calf skin biopsy.  Since then the patient ambulation has slowly deteriorated, unsteady at baseline, had first fall 3 weeks ago and came to ED.  Patient was sent from ED to SNF/memory care unit for PT since.     Body mass index is 23.68 kg/m.  Interventions:   Diet: Regular diet  DVT Prophylaxis: SCD, pharmacological prophylaxis contraindicated due to hematoma     Advance goals of care discussion: DNR/DNI-limited   Family Communication: No family present   Disposition:  Pt is from memory care unit, admitted with left ankle hematoma due to unwitnessed fall, s/p I&D done by vascular surgery. Medically stable for discharge, And will need SNF   Subjective: Seen and examined at bedside this morning Denied acute overnight events Still has telemetry monitor in place  Denies nausea vomiting chest pain or cough Currently pending placement  Physical Exam  Constitutional: Elderly female in no acute distress Cardiovascular: Normal rate, regular rhythm. No lower extremity edema  Pulmonary: Non labored breathing on [place], no wheezing or rales.   Abdominal: Soft. Normal bowel sounds. Non distended and non tender Musculoskeletal: Normal range of motion.     Skin:  Skin is warm and dry.  LLE bandaged, intact   Data Reviewed:  Vitals:   04/06/23 1617 04/06/23 2009 04/07/23 0746 04/07/23 0746  BP: (!) 136/52 (!) 122/57 (!) 156/62 (!) 156/62  Pulse: 77 85 90 91  Resp: 16 19 16 16   Temp: 98.6 F (37 C) 98 F (36.7 C) 98.6 F (37 C) 98.6 F (37 C)  TempSrc:      SpO2: (!) 88% 94% 97% 97%  Weight:      Height:            Latest Ref Rng & Units 04/06/2023    5:45 AM 04/05/2023    4:34 AM 04/04/2023    4:31 AM  BMP  Glucose 70 - 99 mg/dL 897  93  86   BUN 8 - 23 mg/dL 31  27  25    Creatinine 0.44 - 1.00 mg/dL 8.92  8.78  8.72   Sodium 135 - 145 mmol/L 138  137  133   Potassium 3.5 - 5.1 mmol/L 4.2  4.0  4.5   Chloride 98 - 111 mmol/L 107  105  101   CO2 22 - 32 mmol/L 26  23  20    Calcium  8.9 - 10.3 mg/dL 8.6  8.6  8.6        Latest Ref Rng & Units 04/06/2023   10:40 AM 04/06/2023    5:45 AM 04/05/2023   12:44 PM  CBC  WBC 4.0 - 10.5 K/uL 10.3  9.7    Hemoglobin 12.0 - 15.0 g/dL 7.8  7.0  8.1   Hematocrit 36.0 - 46.0 % 24.7  22.6  26.1   Platelets 150 - 400 K/uL 475  447       Author: Drue ONEIDA Potter, MD 04/07/2023 3:50 PM  For on call review [email or web address].

## 2023-04-08 ENCOUNTER — Inpatient Hospital Stay: Payer: PRIVATE HEALTH INSURANCE

## 2023-04-08 ENCOUNTER — Inpatient Hospital Stay: Payer: PRIVATE HEALTH INSURANCE | Admitting: Oncology

## 2023-04-08 DIAGNOSIS — T148XXA Other injury of unspecified body region, initial encounter: Secondary | ICD-10-CM | POA: Diagnosis not present

## 2023-04-08 NOTE — Progress Notes (Signed)
 Physical Therapy Treatment Patient Details Name: Laura Wilcox MRN: [id number] DOB: November 12, 1926 Today's Date: 04/08/2023   History of Present Illness Pt. is a 89 y.o.  female who was admitted with a large left ankle  hematoma, and laceration sustained during a fall at  her ALF. PMHx includes: Advanced Dementia, Polycythemia Vera, Chronic Myelofibrosis on Chemotherapy, chronic ambulation impairment,    PT Comments  Co-tx with OT for improved outcomes.  Attends donned prior to session due to inc with mobility and at baseline.  Rolls left/right with min a to don.  She transitions to EOB with min/mod a x 1 with rails and time.  Steady in sitting and is able to brush hair with no LOB.  Stands to RW with min a x 2 and encouragement as pain in foot remains limiting.  With time she is able to walk about 6' with RW and min a x 1/2.  Pt reports she feels faint and bed is brought up behind her.  BP 152/54 P 70 in sitting after dynamap is obtained.  Stood for standing BP 129/67.  She is assisted to supine with min/mod a x 1 then gets back up on other side of bed to transition to chair with light min a x 2.  Remained up in chair. She does c/o dizziness in chair BP 146/65 P 81.  Chair is semi-reclined and daughter in room.  RN aware.    Discussed DC plan with daughter.  Pt on [facility] Care unit at [facility]  Daughter would like pt to return if staff can manage her.  If not she would need SNF for rehab.  Will relay to team.   If plan is discharge home, recommend the following: A lot of help with walking and/or transfers;A lot of help with bathing/dressing/bathroom;Assistance with cooking/housework;Direct supervision/assist for medications management;Direct supervision/assist for financial management;Assist for transportation;Help with stairs or ramp for entrance;Supervision due to cognitive status   Can travel by private vehicle        Equipment Recommendations       Recommendations for Other Services        Precautions / Restrictions Precautions Precautions: Fall Restrictions Weight Bearing Restrictions Per Provider Order: No     Mobility  Bed Mobility Overal bed mobility: Needs Assistance Bed Mobility: Supine to Sit, Sit to Supine     Supine to sit: Mod assist, Used rails Sit to supine: Min assist, Used rails   General bed mobility comments: increased time to perform. pt requires step by step vcs Patient Response: Cooperative  Transfers Overall transfer level: Needs assistance Equipment used: Rolling walker (2 wheels) Transfers: Sit to/from Stand Sit to Stand: Min assist, +2 physical assistance           General transfer comment: Likely could t/f with MOD A x1. Able to take a few steps forward with good technique for pushing through arms of RW to offweight from painful LLE during mobility. Walked approx 6 feet forward, slowly, CGA-MIN A balance. Then stated she felt faint; bed brought by +2 assist to behind her and able to sit safely. Seated rest break, then returned to supine and then back to sitting on other side of the bed to t/f to recliner next to bed; MIN A x1-2.    Ambulation/Gait Ambulation/Gait assistance: Min assist Gait Distance (Feet): 6 Feet Assistive device: Rolling walker (2 wheels) Gait Pattern/deviations: Step-to pattern Gait velocity: decreased     General Gait Details: good use of RW and able to step despite  pain   Stairs             Wheelchair Mobility     Tilt Bed Tilt Bed Patient Response: Cooperative  Modified Rankin (Stroke Patients Only)       Balance Overall balance assessment: Needs assistance Sitting-balance support: Feet supported, Bilateral upper extremity supported Sitting balance-Leahy Scale: Fair Sitting balance - Comments: able to sit to brush hair   Standing balance support: Bilateral upper extremity supported, During functional activity, Reliant on assistive device for balance Standing balance-Leahy Scale: Fair                               Cognition Arousal: Alert Behavior During Therapy: WFL for tasks assessed/performed Overall Cognitive Status: History of cognitive impairments - at baseline                                          Exercises      General Comments General comments (skin integrity, edema, etc.): On room air. Wearing brief during session 2/2 urinary incontinence (baseline); daughter in room and supportive throughout session. Pt wearing one hearing aid today (other is broken, per daughter)      Pertinent Vitals/Pain Pain Assessment Pain Assessment: Faces Faces Pain Scale: Hurts whole lot Pain Location: LLE Pain Descriptors / Indicators: Discomfort Pain Intervention(s): Limited activity within patient's tolerance, Monitored during session, Repositioned    Home Living                          Prior Function            PT Goals (current goals can now be found in the care plan section) Progress towards PT goals: Progressing toward goals    Frequency    Min 1X/week      PT Plan      Co-evaluation PT/OT/SLP Co-Evaluation/Treatment: Yes Reason for Co-Treatment: Complexity of the patient's impairments (multi-system involvement);For patient/therapist safety;To address functional/ADL transfers PT goals addressed during session: Mobility/safety with mobility;Balance OT goals addressed during session: ADL's and self-care;Proper use of Adaptive equipment and DME      AM-PAC PT 6 Clicks Mobility   Outcome Measure  Help needed turning from your back to your side while in a flat bed without using bedrails?: A Little Help needed moving from lying on your back to sitting on the side of a flat bed without using bedrails?: A Little Help needed moving to and from a bed to a chair (including a wheelchair)?: A Little Help needed standing up from a chair using your arms (e.g., wheelchair or bedside chair)?: A Little Help needed to walk  in hospital room?: A Lot Help needed climbing 3-5 steps with a railing? : Total 6 Click Score: 15    End of Session Equipment Utilized During Treatment: Gait belt Activity Tolerance: Patient tolerated treatment well Patient left: in chair;with call bell/phone within reach;with chair alarm set Nurse Communication: Mobility status PT Visit Diagnosis: Other abnormalities of gait and mobility (R26.89);Muscle weakness (generalized) (M62.81);Difficulty in walking, not elsewhere classified (R26.2);Unsteadiness on feet (R26.81)     Time: 8889-8851 PT Time Calculation (min) (ACUTE ONLY): 38 min  Charges:    $Gait Training: 8-22 mins PT General Charges $$ ACUTE PT VISIT: 1 Visit  Lauraine Gills, PTA 04/08/23, 1:08 PM

## 2023-04-08 NOTE — Plan of Care (Addendum)
 Patient is alert and oriented X 1.she feels lethargic whole day. MD made aware. Plan of care ongoing.   Problem: Education: Goal: Knowledge of General Education information will improve Description: Including pain rating scale, medication(s)/side effects and non-pharmacologic comfort measures Outcome: Progressing   Problem: Health Behavior/Discharge Planning: Goal: Ability to manage health-related needs will improve Outcome: Progressing   Problem: Clinical Measurements: Goal: Ability to maintain clinical measurements within normal limits will improve Outcome: Progressing Goal: Will remain free from infection Outcome: Progressing Goal: Diagnostic test results will improve Outcome: Progressing Goal: Respiratory complications will improve Outcome: Progressing Goal: Cardiovascular complication will be avoided Outcome: Progressing   Problem: Activity: Goal: Risk for activity intolerance will decrease Outcome: Progressing   Problem: Nutrition: Goal: Adequate nutrition will be maintained Outcome: Progressing   Problem: Coping: Goal: Level of anxiety will decrease Outcome: Progressing   Problem: Elimination: Goal: Will not experience complications related to bowel motility Outcome: Progressing Goal: Will not experience complications related to urinary retention Outcome: Progressing   Problem: Pain Management: Goal: General experience of comfort will improve Outcome: Progressing   Problem: Safety: Goal: Ability to remain free from injury will improve Outcome: Progressing   Problem: Skin Integrity: Goal: Risk for impaired skin integrity will decrease Outcome: Progressing

## 2023-04-08 NOTE — Progress Notes (Signed)
 Occupational Therapy Treatment Patient Details Name: Laura Wilcox MRN: [id number] DOB: 06-20-1926 Today's Date: 04/08/2023   History of present illness Pt. is a 89 y.o.  female who was admitted with a large left ankle  hematoma, and laceration sustained during a fall at  her ALF. PMHx includes: Advanced Dementia, Polycythemia Vera, Chronic Myelofibrosis on Chemotherapy, chronic ambulation impairment,   OT comments  Pt received supine in bed; daughter in room and supportive throughout session. Appearing alert; willing to work with OT on mobility and grooming. T/f MIN Ax2 with RW. See flowsheet below for further details of session. Left with LE elevated in recliner with all needs in reach.  Pt reported feeling faint during mobility. BP taken in sitting just after she felt faint: 152/54. A few minutes later, BP taken in standing 129/67.  Once semi-reclined in recliner (after transferring), pt feeling faint again and BP 146/65. Encouraged pt to stay up in chair as tolerated; call for nursing when needing to return to bed. Patient will benefit from continued OT while in acute care.      If plan is discharge home, recommend the following:  Two people to help with walking and/or transfers;A lot of help with bathing/dressing/bathroom;Direct supervision/assist for medications management;Supervision due to cognitive status;Direct supervision/assist for financial management;Assist for transportation;Help with stairs or ramp for entrance   Equipment Recommendations  Other (comment) (defer to next venue of care)    Recommendations for Other Services      Precautions / Restrictions Precautions Precautions: Fall Restrictions Weight Bearing Restrictions Per Provider Order: No       Mobility Bed Mobility Overal bed mobility: Needs Assistance Bed Mobility: Supine to Sit, Sit to Supine     Supine to sit: Mod assist, Used rails Sit to supine: Min assist, Used rails        Transfers Overall  transfer level: Needs assistance Equipment used: Rolling walker (2 wheels) Transfers: Sit to/from Stand Sit to Stand: Min assist, +2 physical assistance           General transfer comment: Likely could t/f with MOD A x1. Able to take a few steps forward with good technique for pushing through arms of RW to offweight from painful LLE during mobility. Walked approx 6 feet forward, slowly, CGA-MIN A balance. Then stated she felt faint; bed brought by +2 assist to behind her and able to sit safely. Seated rest break, then returned to supine and then back to sitting on other side of the bed to t/f to recliner next to bed; MIN A x1-2.     Balance Overall balance assessment: Needs assistance Sitting-balance support: Feet supported, Bilateral upper extremity supported Sitting balance-Leahy Scale: Fair     Standing balance support: Bilateral upper extremity supported, During functional activity, Reliant on assistive device for balance Standing balance-Leahy Scale: Fair                             ADL either performed or assessed with clinical judgement   ADL Overall ADL's : Needs assistance/impaired     Grooming: Brushing hair;Minimal assistance;Sitting Grooming Details (indicate cue type and reason): seated EOB; able to brush hair; needed slight assistance for the back of head.             Lower Body Dressing: Total assistance Lower Body Dressing Details (indicate cue type and reason): dependent for donning BIL socks  General ADL Comments: Will require MOD-MAX A for standing ADLs and LB ADLs. Likely set up for seated ADLS (grooming, UB dressing). Anticipate able to t/f to toilet with MIN-MOD A x1 with RW.    Extremity/Trunk Assessment Upper Extremity Assessment Upper Extremity Assessment: Overall WFL for tasks assessed   Lower Extremity Assessment Lower Extremity Assessment: Defer to PT evaluation        Vision       Perception     Praxis       Cognition Arousal: Alert Behavior During Therapy: WFL for tasks assessed/performed Overall Cognitive Status: History of cognitive impairments - at baseline                                 General Comments: Very pleasant; follows instructions well; limited by pain. Difficulty always describing how she's feeling, but is able to say I feel faint. Good sense of humor (OT asked if [place] brought her anything for Christmas; pt stated no, I've been bad this year. It's fun; you should try it and then smiled at OT.)        Exercises      Shoulder Instructions       General Comments On room air. Wearing brief during session 2/2 urinary incontinence (baseline); daughter in room and supportive throughout session. Pt wearing one hearing aid today (other is broken, per daughter)    Pertinent Vitals/ Pain       Pain Assessment Pain Assessment: PAINAD Breathing: normal Negative Vocalization: occasional moan/groan, low speech, negative/disapproving quality Facial Expression: sad, frightened, frown Body Language: tense, distressed pacing, fidgeting Consolability: distracted or reassured by voice/touch PAINAD Score: 4 Pain Location: LLE Pain Descriptors / Indicators: Discomfort Pain Intervention(s): Limited activity within patient's tolerance, Monitored during session, Repositioned  Home Living                                          Prior Functioning/Environment              Frequency  Min 1X/week        Progress Toward Goals  OT Goals(current goals can now be found in the care plan section)  Progress towards OT goals: Progressing toward goals  Acute Rehab OT Goals OT Goal Formulation: Patient unable to participate in goal setting  Plan      Co-evaluation    PT/OT/SLP Co-Evaluation/Treatment: Yes Reason for Co-Treatment: Complexity of the patient's impairments (multi-system involvement);For patient/therapist safety;To address  functional/ADL transfers   OT goals addressed during session: ADL's and self-care;Strengthening/ROM;Proper use of Adaptive equipment and DME      AM-PAC OT 6 Clicks Daily Activity     Outcome Measure   Help from another person eating meals?: None Help from another person taking care of personal grooming?: A Little Help from another person toileting, which includes using toliet, bedpan, or urinal?: A Lot Help from another person bathing (including washing, rinsing, drying)?: A Lot Help from another person to put on and taking off regular upper body clothing?: A Little Help from another person to put on and taking off regular lower body clothing?: A Lot 6 Click Score: 16    End of Session Equipment Utilized During Treatment: Gait belt;Rolling walker (2 wheels)  OT Visit Diagnosis: Unsteadiness on feet (R26.81);History of falling (Z91.81);Muscle weakness (generalized) (M62.81)   Activity Tolerance  Patient tolerated treatment well;Patient limited by pain   Patient Left in chair;with call bell/phone within reach;with chair alarm set;with family/visitor present   Nurse Communication Mobility status        Time: 8890-8853 OT Time Calculation (min): 37 min  Charges: OT General Charges $OT Visit: 1 Visit OT Treatments $Self Care/Home Management : 8-22 mins  Jasmine Arlean Shams, MS, OTR/L  Jasmine Shams 04/08/2023, 12:17 PM

## 2023-04-08 NOTE — Progress Notes (Signed)
 Progress Note   Patient: Laura Wilcox FMW:[id number] DOB: 1926-06-10 DOA: 03/31/2023     7 DOS: the patient was seen and examined on 04/08/2023    Brief hospital course: Laura Wilcox is a 89 y.o. female with PMH of Advanced dementia, polycythemia vera and chronic myelofibrosis on chemotherapy, chronic iron  deficiency anemia, chronic ambulation impairment, was found on the floor with a large left ankle hematoma in the morning on 03/31/23.   Patient does not remember what has happened, and this morning patient was found on the floor awake, with a large left inner ankle hematoma.  Patient does not remember whether she had a fall or hit anything with the left foot last night or this morning.  Family at bedside reported that the patient sustained a unwitnessed fall 3 weeks ago and hit her head with a large hematoma but no LOC.  At baseline, patient has dementia and family reported that the patient prefers to staying  dehydrated by avoiding drinking water so that does not have to change diaper On arrival in the ED, patient was found to be hypotensive blood pressure 75/35, none tachycardia nonhypoxic afebrile.  Blood work showed hemoglobin 8.9 compared to baseline 9.9, platelet 646, creatinine 1.0, BUN 25, glucose 98.   Patient was given 1 L of IV bolus in the ED.  ED physician tried to manually extract hematoma and clots from the left ankle.     Assessment and Plan:   # Left ankle hematoma Patient was found on the floor, significant dementia, poor historian.  Hematoma most likely secondary to unwitnessed trauma and fall. S/p manual extraction of blood clots done by ED physician, left ankle was wrapped with pressure dressing. 1/2 Resumed Aspirin  vascular surgery consulted, s/p I&D under general anesthesia done on 1/1 As per vascular surgery no restriction for activity, dressing changed 1/3, 1/5 by MD PT/OT recommending SNF,  TOC working on this     # Acute blood loss anemia due to left  ankle hematoma, most likely due to trauma History of iron  deficiency anemia.  Iron  profile within normal range Hb 7.0, transfuse 1 unit of PRBC on 12/31 Hgb stable  Monitor H&H   Elevated creatinine, most likely prerenal due to dehydration, decreased oral intake 1/3 Cr 1.27>1.21>1.07 1/3 s/p IV fluids Monitor renal function closely     # Hypotension Resolved  Continue to monitor blood pressure   # Polycythemia vera Chronic myelofibrosis -Current chemotherapy Jakafi  not on formulary held during hospitalization    # Chronic ambulation impairment -Family reported patient has been using roller walker for the last 2 years with home PT.  However about 6 months ago, home PT was suspended due to a slow healing wound after left calf skin biopsy.  Since then the patient ambulation has slowly deteriorated, unsteady at baseline, had first fall 3 weeks ago and came to ED.  Patient was sent from ED to SNF/memory care unit for PT since.   Diet: Regular diet   DVT Prophylaxis: SCD, pharmacological prophylaxis contraindicated due to hematoma     Advance goals of care discussion: DNR/DNI-limited   Family Communication: No family present   Disposition:  Pt is from memory care unit, admitted with left ankle hematoma due to unwitnessed fall, s/p I&D done by vascular surgery. Medically stable for discharge, And will need SNF    Subjective: Seen and examined at bedside this morning Denies nausea vomiting chest pain abdominal pain POC working on placement   Physical Exam  Constitutional: Elderly female in  no acute distress Cardiovascular: Normal rate, regular rhythm. No lower extremity edema  Pulmonary: Non labored breathing on [place], no wheezing or rales.   Abdominal: Soft. Normal bowel sounds. Non distended and non tender Musculoskeletal: Normal range of motion.     Skin: Skin is warm and dry.  LLE bandaged, intact    Data Reviewed:    Latest Ref Rng & Units 04/06/2023   10:40 AM 04/06/2023     5:45 AM 04/05/2023   12:44 PM  CBC  WBC 4.0 - 10.5 K/uL 10.3  9.7    Hemoglobin 12.0 - 15.0 g/dL 7.8  7.0  8.1   Hematocrit 36.0 - 46.0 % 24.7  22.6  26.1   Platelets 150 - 400 K/uL 475  447         Latest Ref Rng & Units 04/06/2023    5:45 AM 04/05/2023    4:34 AM 04/04/2023    4:31 AM  BMP  Glucose 70 - 99 mg/dL 897  93  86   BUN 8 - 23 mg/dL 31  27  25    Creatinine 0.44 - 1.00 mg/dL 8.92  8.78  8.72   Sodium 135 - 145 mmol/L 138  137  133   Potassium 3.5 - 5.1 mmol/L 4.2  4.0  4.5   Chloride 98 - 111 mmol/L 107  105  101   CO2 22 - 32 mmol/L 26  23  20    Calcium  8.9 - 10.3 mg/dL 8.6  8.6  8.6      Vitals:   04/07/23 1617 04/07/23 2003 04/08/23 0405 04/08/23 0754  BP: (!) 145/67 (!) 149/69 (!) 160/69 99/70  Pulse:  84 82 78  Resp: 16 18 18 15   Temp: 98.2 F (36.8 C) 98.6 F (37 C) 97.8 F (36.6 C) (!) 97.5 F (36.4 C)  TempSrc:  Oral Oral   SpO2: 90% 92% 93% 90%  Weight:      Height:         Author: Drue ONEIDA Potter, MD 04/08/2023 2:38 PM  For on call review [email or web address].

## 2023-04-08 NOTE — Plan of Care (Signed)

## 2023-04-09 DIAGNOSIS — T148XXA Other injury of unspecified body region, initial encounter: Secondary | ICD-10-CM | POA: Diagnosis not present

## 2023-04-09 MED ORDER — TRAMADOL HCL 50 MG PO TABS
50.0000 mg | ORAL_TABLET | Freq: Four times a day (QID) | ORAL | Status: DC | PRN
  Administered 2023-04-09 – 2023-04-11 (×5): 50 mg via ORAL
  Filled 2023-04-09 (×6): qty 1

## 2023-04-09 MED ORDER — MORPHINE SULFATE (PF) 2 MG/ML IV SOLN
2.0000 mg | INTRAVENOUS | Status: DC | PRN
  Administered 2023-04-09: 2 mg via INTRAVENOUS
  Filled 2023-04-09: qty 1

## 2023-04-09 NOTE — Progress Notes (Signed)
 PRN nebulizer administered due to harsh congested cough, ronchi and audible wheezing. C/O pain radiating down left leg. Pt repositioned and 0600 Tylenol administered.

## 2023-04-09 NOTE — TOC Progression Note (Addendum)
 Transition of Care [facility]) - Progression Note    Patient Details  Name: Laura Wilcox MRN: [id number] Date of Birth: [birth year]  [facility] CM/SW Contact  Ladene Lady, LCSW Phone Number: 04/09/2023, 9:47 AM  Clinical Narrative:   No bed offers- referrals extended out.    CSW spoke with daughter who reports she wants pt to return to home place with [facility] if possible. Message left with Tavina at home place to see if she is able to return.          Expected Discharge Plan and Services                                               Social Determinants of Health (SDOH) Interventions SDOH Screenings   Food Insecurity: No Food Insecurity (04/01/2023)  Housing: Low Risk  (04/01/2023)  Transportation Needs: No Transportation Needs (04/01/2023)  Utilities: Not At Risk (04/01/2023)  Alcohol  Screen: Low Risk  (06/18/2021)  Depression (PHQ2-9): High Risk (06/18/2021)  Social Connections: Patient Unable To Answer (04/01/2023)  Tobacco Use: Low Risk  (04/02/2023)    Readmission Risk Interventions    06/26/2021    4:26 PM  Readmission Risk Prevention Plan  Transportation Screening Complete  PCP or Specialist Appt within 5-7 Days Complete  Home Care Screening Complete  Medication Review (RN CM) Complete

## 2023-04-09 NOTE — Plan of Care (Signed)

## 2023-04-09 NOTE — Progress Notes (Signed)
 Progress Note   Patient: Laura Wilcox FMW:[id number] DOB: November 06, 1926 DOA: 03/31/2023     8 DOS: the patient was seen and examined on 04/09/2023   Brief hospital course: Laura Wilcox is a 89 y.o. female with PMH of Advanced dementia, polycythemia vera and chronic myelofibrosis on chemotherapy, chronic iron  deficiency anemia, chronic ambulation impairment, was found on the floor with a large left ankle hematoma in the morning on 03/31/23.   Patient does not remember what has happened, and this morning patient was found on the floor awake, with a large left inner ankle hematoma.  Patient does not remember whether she had a fall or hit anything with the left foot last night or this morning.  Family at bedside reported that the patient sustained a unwitnessed fall 3 weeks ago and hit her head with a large hematoma but no LOC.  At baseline, patient has dementia and family reported that the patient prefers to staying  dehydrated by avoiding drinking water so that does not have to change diaper On arrival in the ED, patient was found to be hypotensive blood pressure 75/35, none tachycardia nonhypoxic afebrile.  Blood work showed hemoglobin 8.9 compared to baseline 9.9, platelet 646, creatinine 1.0, BUN 25, glucose 98.   Patient was given 1 L of IV bolus in the ED.  ED physician tried to manually extract hematoma and clots from the left ankle.     Assessment and Plan:   # Left ankle hematoma Patient was found on the floor, significant dementia, poor historian.  Hematoma most likely secondary to unwitnessed trauma and fall. S/p manual extraction of blood clots done by ED physician, left ankle was wrapped with pressure dressing. 1/2 Resumed Aspirin  Vascular surgery on board and case discussed Patient is s/p I&D under general anesthesia done on 1/1 As per vascular surgery no restriction for activity PT/OT recommending SNF  TOC working on this     # Acute blood loss anemia due to left ankle  hematoma, most likely due to trauma History of iron  deficiency anemia.  Iron  profile within normal range Hb 7.0, transfuse 1 unit of PRBC on 12/31 Hgb stable  Monitor H&H   Elevated creatinine, most likely prerenal due to dehydration, decreased oral intake 1/3 Cr 1.27>1.21>1.07 1/3 s/p IV fluids Monitor renal function closely     # Hypotension Resolved  Continue to monitor blood pressure   # Polycythemia vera Chronic myelofibrosis -Current chemotherapy Jakafi  not on formulary held during hospitalization    # Chronic ambulation impairment -Family reported patient has been using roller walker for the last 2 years with home PT.  However about 6 months ago, home PT was suspended due to a slow healing wound after left calf skin biopsy.  Since then the patient ambulation has slowly deteriorated, unsteady at baseline, had first fall 3 weeks ago and came to ED.  Patient was sent from ED to SNF/memory care unit for PT since.   Diet: Regular diet   DVT Prophylaxis: SCD, pharmacological prophylaxis contraindicated due to hematoma     Advance goals of care discussion: DNR/DNI-limited   Family Communication: No family present   Disposition:  Pt is from memory care unit, admitted with left ankle hematoma due to unwitnessed fall, s/p I&D done by vascular surgery. Medically stable for discharge, And will need SNF    Subjective: Seen and examined at bedside this morning Denies nausea vomiting chest pain abdominal pain POC working on placement   Physical Exam  Constitutional: Elderly female in no  acute distress Cardiovascular: Normal rate, regular rhythm. No lower extremity edema  Pulmonary: Non labored breathing on [place], no wheezing or rales.   Abdominal: Soft. Normal bowel sounds. Non distended and non tender Musculoskeletal: Normal range of motion.     Skin: Skin is warm and dry.  LLE bandaged, intact    Data Reviewed:    Latest Ref Rng & Units 04/06/2023    5:45 AM 04/05/2023    4:34 AM  04/04/2023    4:31 AM  BMP  Glucose 70 - 99 mg/dL 897  93  86   BUN 8 - 23 mg/dL 31  27  25    Creatinine 0.44 - 1.00 mg/dL 8.92  8.78  8.72   Sodium 135 - 145 mmol/L 138  137  133   Potassium 3.5 - 5.1 mmol/L 4.2  4.0  4.5   Chloride 98 - 111 mmol/L 107  105  101   CO2 22 - 32 mmol/L 26  23  20    Calcium  8.9 - 10.3 mg/dL 8.6  8.6  8.6     Vitals:   04/08/23 1800 04/08/23 2030 04/09/23 0430 04/09/23 0834  BP: (!) 120/90 125/86 129/84 (!) 125/56  Pulse:  78 82 82  Resp:  16 18 14   Temp:  97.8 F (36.6 C) 98 F (36.7 C) 97.8 F (36.6 C)  TempSrc:  Oral    SpO2:  96% 98% 92%  Weight:      Height:          Latest Ref Rng & Units 04/06/2023   10:40 AM 04/06/2023    5:45 AM 04/05/2023   12:44 PM  CBC  WBC 4.0 - 10.5 K/uL 10.3  9.7    Hemoglobin 12.0 - 15.0 g/dL 7.8  7.0  8.1   Hematocrit 36.0 - 46.0 % 24.7  22.6  26.1   Platelets 150 - 400 K/uL 475  447       Author: Drue ONEIDA Potter, MD 04/09/2023 3:46 PM  For on call review [email or web address].

## 2023-04-09 NOTE — Progress Notes (Signed)
 Progress Note    04/09/2023 10:53 AM 7 Days Post-Op  Subjective:  Laura Wilcox is a 89 yo female now postop day 7 from left ankle I&D.  Patient is resting comfortably in bed this morning sleeping when I arrived.  Patient's daughter is at the bedside.  Her concern was the patient got some Dilaudid  for pain earlier this morning before she got there and that was concerned that the patient will work with physical therapy today because of the effects of the narcotic medication.  I endorse that I would speak to Dr. Dorinda the hospitalist to address this issue to make sure that the patient does continue to work with physical therapy.  No other complaints overnight.  Vitals are remained stable.  Dressing remains intact.   Vitals:   04/09/23 0430 04/09/23 0834  BP: 129/84 (!) 125/56  Pulse: 82 82  Resp: 18 14  Temp: 98 F (36.7 C) 97.8 F (36.6 C)  SpO2: 98% 92%   Physical Exam: Cardiac:  RRR, no rubs clicks or gallops.  No murmurs appreciated. Lungs: Lungs clear throughout on auscultation but diminished in the bases. Incisions: Left lower extremity postop I&D.  Bandage wrapping in place.  Changed today at the bedside. Extremities: Bilateral lower extremities warm to touch.  Left lower extremity with the I&D of the ankle hematoma.  Dressing changed today.  No signs or symptoms of infection hematoma or seroma collection noted.  Healing slowly. Abdomen: Positive bowel sounds throughout, soft, nontender nondistended. Neurologic: Alert and oriented to name and place.  Patient does have a history of new onset dementia.  Daughter is at the bedside states that patient is at her baseline.  Patient does seem a little more confused this morning but she did get narcotic medication earlier.  CBC    Component Value Date/Time   WBC 10.3 04/06/2023 1040   RBC 2.98 (L) 04/06/2023 1040   HGB 7.8 (L) 04/06/2023 1040   HGB 10.3 (L) 02/25/2023 1347   HGB 8.1 (L) 06/18/2021 1514   HCT 24.7 (L) 04/06/2023  1040   HCT 25.2 (L) 06/18/2021 1514   PLT 475 (H) 04/06/2023 1040   PLT 677 (H) 02/25/2023 1347   PLT 452 (H) 06/18/2021 1514   MCV 82.9 04/06/2023 1040   MCV 91 06/18/2021 1514   MCH 26.2 04/06/2023 1040   MCHC 31.6 04/06/2023 1040   RDW 27.6 (H) 04/06/2023 1040   RDW 20.1 (H) 06/18/2021 1514   LYMPHSABS 1.5 03/31/2023 0933   LYMPHSABS 1.0 06/18/2021 1514   MONOABS 1.0 03/31/2023 0933   EOSABS 0.4 03/31/2023 0933   EOSABS 0.4 06/18/2021 1514   BASOSABS 0.2 (H) 03/31/2023 0933   BASOSABS 0.3 (H) 06/18/2021 1514    BMET    Component Value Date/Time   NA 138 04/06/2023 0545   NA 134 06/18/2021 1514   K 4.2 04/06/2023 0545   CL 107 04/06/2023 0545   CO2 26 04/06/2023 0545   GLUCOSE 102 (H) 04/06/2023 0545   BUN 31 (H) 04/06/2023 0545   BUN 19 06/18/2021 1514   CREATININE 1.07 (H) 04/06/2023 0545   CREATININE 1.41 (H) 12/30/2022 1118   CALCIUM  8.6 (L) 04/06/2023 0545   GFRNONAA 48 (L) 04/06/2023 0545   GFRNONAA 35 (L) 12/30/2022 1118   GFRAA 49 (L) 12/27/2019 0935    INR    Component Value Date/Time   INR 1.0 03/31/2023 0933     Intake/Output Summary (Last 24 hours) at 04/09/2023 1053 Last data filed at 04/08/2023 1534  Gross per 24 hour  Intake 0 ml  Output --  Net 0 ml     Assessment/Plan:  89 y.o. female is s/p left ankle hematoma I&D.  7 Days Post-Op   PLAN Dressing changes to left ankle by nursing daily. Dressing change consists of Vaseline impregnated gauze covered by 4 x 4's then wrapped in Kerlix then wrapped with Ace bandage. Continue with PT OT. Pain medications as needed and sparingly due to patient's history of dementia. Dr. Serene has set up outpatient plastic surgery follow-up for this coming Friday.  Daughter would like patient to be at this visit. Okay per vascular surgery for patient discharge to either SNF or what ever the family decides is best. Vascular surgery to sign off this case at this time.  DVT prophylaxis: ASA 81 mg  daily   Gwendlyn JONELLE Shank [facility] 04/09/2023 10:53 AM

## 2023-04-09 NOTE — Plan of Care (Addendum)
 Patient is alert and oriented X 1.she complained pain in her left leg, gave medicine as order. Elevated her left leg on pillow. No any discharge noted, it looks clean dry and intact.pt has nonproductive strong cough, and wheezing, gave prn breathing treatment. Plan of care ongoing.   Problem: Education: Goal: Knowledge of General Education information will improve Description: Including pain rating scale, medication(s)/side effects and non-pharmacologic comfort measures Outcome: Progressing   Problem: Health Behavior/Discharge Planning: Goal: Ability to manage health-related needs will improve Outcome: Progressing   Problem: Clinical Measurements: Goal: Ability to maintain clinical measurements within normal limits will improve Outcome: Progressing Goal: Will remain free from infection Outcome: Progressing Goal: Diagnostic test results will improve Outcome: Progressing Goal: Respiratory complications will improve Outcome: Progressing Goal: Cardiovascular complication will be avoided Outcome: Progressing   Problem: Activity: Goal: Risk for activity intolerance will decrease Outcome: Progressing   Problem: Nutrition: Goal: Adequate nutrition will be maintained Outcome: Progressing   Problem: Coping: Goal: Level of anxiety will decrease Outcome: Progressing   Problem: Elimination: Goal: Will not experience complications related to bowel motility Outcome: Progressing Goal: Will not experience complications related to urinary retention Outcome: Progressing   Problem: Pain Management: Goal: General experience of comfort will improve Outcome: Progressing   Problem: Safety: Goal: Ability to remain free from injury will improve Outcome: Progressing   Problem: Skin Integrity: Goal: Risk for impaired skin integrity will decrease Outcome: Progressing

## 2023-04-10 ENCOUNTER — Telehealth: Payer: Self-pay | Admitting: Plastic Surgery

## 2023-04-10 DIAGNOSIS — S9002XA Contusion of left ankle, initial encounter: Secondary | ICD-10-CM | POA: Diagnosis not present

## 2023-04-10 DIAGNOSIS — D62 Acute posthemorrhagic anemia: Secondary | ICD-10-CM | POA: Diagnosis not present

## 2023-04-10 DIAGNOSIS — R531 Weakness: Secondary | ICD-10-CM | POA: Diagnosis not present

## 2023-04-10 DIAGNOSIS — D751 Secondary polycythemia: Secondary | ICD-10-CM | POA: Diagnosis not present

## 2023-04-10 DIAGNOSIS — H9190 Unspecified hearing loss, unspecified ear: Secondary | ICD-10-CM

## 2023-04-10 DIAGNOSIS — E860 Dehydration: Secondary | ICD-10-CM

## 2023-04-10 LAB — URINALYSIS, ROUTINE W REFLEX MICROSCOPIC
Bilirubin Urine: NEGATIVE
Glucose, UA: NEGATIVE mg/dL
Hgb urine dipstick: NEGATIVE
Ketones, ur: NEGATIVE mg/dL
Leukocytes,Ua: NEGATIVE
Nitrite: NEGATIVE
Protein, ur: NEGATIVE mg/dL
Specific Gravity, Urine: 1.024 (ref 1.005–1.030)
pH: 5 (ref 5.0–8.0)

## 2023-04-10 LAB — CBC WITH DIFFERENTIAL/PLATELET
Abs Immature Granulocytes: 1.02 10*3/uL — ABNORMAL HIGH (ref 0.00–0.07)
Basophils Absolute: 0.1 10*3/uL (ref 0.0–0.1)
Basophils Relative: 1 %
Eosinophils Absolute: 0.5 10*3/uL (ref 0.0–0.5)
Eosinophils Relative: 5 %
HCT: 24.5 % — ABNORMAL LOW (ref 36.0–46.0)
Hemoglobin: 7.6 g/dL — ABNORMAL LOW (ref 12.0–15.0)
Immature Granulocytes: 9 %
Lymphocytes Relative: 11 %
Lymphs Abs: 1.3 10*3/uL (ref 0.7–4.0)
MCH: 26.2 pg (ref 26.0–34.0)
MCHC: 31 g/dL (ref 30.0–36.0)
MCV: 84.5 fL (ref 80.0–100.0)
Monocytes Absolute: 1.1 10*3/uL — ABNORMAL HIGH (ref 0.1–1.0)
Monocytes Relative: 10 %
Neutro Abs: 7.5 10*3/uL (ref 1.7–7.7)
Neutrophils Relative %: 64 %
Platelets: 539 10*3/uL — ABNORMAL HIGH (ref 150–400)
RBC: 2.9 MIL/uL — ABNORMAL LOW (ref 3.87–5.11)
RDW: 28.3 % — ABNORMAL HIGH (ref 11.5–15.5)
Smear Review: NORMAL
WBC: 11.5 10*3/uL — ABNORMAL HIGH (ref 4.0–10.5)
nRBC: 0.9 % — ABNORMAL HIGH (ref 0.0–0.2)

## 2023-04-10 LAB — BASIC METABOLIC PANEL
Anion gap: 10 (ref 5–15)
BUN: 25 mg/dL — ABNORMAL HIGH (ref 8–23)
CO2: 27 mmol/L (ref 22–32)
Calcium: 8.5 mg/dL — ABNORMAL LOW (ref 8.9–10.3)
Chloride: 100 mmol/L (ref 98–111)
Creatinine, Ser: 1.09 mg/dL — ABNORMAL HIGH (ref 0.44–1.00)
GFR, Estimated: 47 mL/min — ABNORMAL LOW (ref >60)
Glucose, Bld: 98 mg/dL (ref 70–99)
Potassium: 4.4 mmol/L (ref 3.5–5.1)
Sodium: 137 mmol/L (ref 135–145)

## 2023-04-10 MED ORDER — MORPHINE SULFATE (PF) 2 MG/ML IV SOLN
2.0000 mg | Freq: Once | INTRAVENOUS | Status: AC
  Administered 2023-04-10: 2 mg via INTRAVENOUS
  Filled 2023-04-10: qty 1

## 2023-04-10 NOTE — Telephone Encounter (Signed)
 I would recommend the patient or her daughter call us  once she is discharged from the hospital and we can be sure to see her ASAP.  She can be booked with one of the PAs for initial appointment, would book on a day that Dr. Lowery is also here if case needs to be discussed with her.

## 2023-04-10 NOTE — Telephone Encounter (Signed)
 Patient is still in hospital and needs to move from tomorrow, she is an ED follow up. Please advise on where I can put her into the schedule.

## 2023-04-10 NOTE — Plan of Care (Signed)
 Pt is alert x2 disoriented to time and situation. Pt has wound to left inner ankle with dressing CDI. Pt sitting up in chair with assistance from Physical Therapy.  Problem: Education: Goal: Knowledge of General Education information will improve Description: Including pain rating scale, medication(s)/side effects and non-pharmacologic comfort measures Outcome: Progressing   Problem: Health Behavior/Discharge Planning: Goal: Ability to manage health-related needs will improve Outcome: Progressing   Problem: Clinical Measurements: Goal: Ability to maintain clinical measurements within normal limits will improve Outcome: Progressing Goal: Will remain free from infection Outcome: Progressing Goal: Diagnostic test results will improve Outcome: Progressing Goal: Respiratory complications will improve Outcome: Progressing Goal: Cardiovascular complication will be avoided Outcome: Progressing   Problem: Activity: Goal: Risk for activity intolerance will decrease Outcome: Progressing   Problem: Nutrition: Goal: Adequate nutrition will be maintained Outcome: Progressing   Problem: Coping: Goal: Level of anxiety will decrease Outcome: Progressing   Problem: Elimination: Goal: Will not experience complications related to bowel motility Outcome: Progressing Goal: Will not experience complications related to urinary retention Outcome: Progressing   Problem: Pain Management: Goal: General experience of comfort will improve Outcome: Progressing   Problem: Safety: Goal: Ability to remain free from injury will improve Outcome: Progressing   Problem: Skin Integrity: Goal: Risk for impaired skin integrity will decrease Outcome: Progressing

## 2023-04-10 NOTE — Progress Notes (Signed)
 Contacted on call provider at [phone] via secure chat as follows: Pt has had several falls, and has severe LLE pain. She has a large hematoma to left lateral ankle, that had an I&D done on it and gets a daily dressing. She gets routine Tylenol  and PRN Tramadol . Neither has been effective for her and she has had ice, repositioning, etc. Continues to yell and cry and trembling. She had a one time dose of dilauded the other day that was effective. Could we do that again please?

## 2023-04-10 NOTE — Progress Notes (Signed)
 Contacted on call provider at [phone] via secure chat as follows: Hello. I wanted to request to obtain a urine culture and more specifically an ammonia level on this patient. Her urine has a very potent ammonia smell.  She is not eating or drinking either. Sips with meds.   01:42 Also, this same pt has wheezing and rhonchi and congested cough. Neb treatments help, but she has not had a chest x-ray done.   He ordered to obtain a urine specimen, clean catch. Purewick that was on her was not in place and she was incontinent of large amount of urine. Hygiene completed and a new purewick, tubing and canister placed. As of 0400 she has not had any further output. On call aware.

## 2023-04-10 NOTE — Progress Notes (Signed)
 Physical Therapy Treatment Patient Details Name: Laura Wilcox MRN: [id number] DOB: 1926-04-18 Today's Date: 04/10/2023   History of Present Illness Pt. is a 89 y.o.  female who was admitted with a large left ankle  hematoma, and laceration sustained during a fall at  her ALF. PMHx includes: Advanced Dementia, Polycythemia Vera, Chronic Myelofibrosis on Chemotherapy, chronic ambulation impairment,    PT Comments  Pt with baseline dementia, however very cooperative despite HOH (Right hearing aid in).  Increased assistance needed this date for transfers and OOB activity due to L LE pain. Pt unable to accept wt through L LE while attempting to stand with RW.  Pt required ModA to complete squat pivot bed to chair.  Comfortable once reclined in chair with all needs met. Will continue PT acutely and progress as tolerated.    If plan is discharge home, recommend the following: A lot of help with walking and/or transfers;A lot of help with bathing/dressing/bathroom;Assistance with cooking/housework;Direct supervision/assist for medications management;Direct supervision/assist for financial management;Assist for transportation;Help with stairs or ramp for entrance;Supervision due to cognitive status   Can travel by private vehicle     No  Equipment Recommendations  Other (comment) (Defer to next level of care)    Recommendations for Other Services       Precautions / Restrictions Precautions Precautions: Fall Restrictions Weight Bearing Restrictions Per Provider Order: No     Mobility  Bed Mobility Overal bed mobility: Needs Assistance Bed Mobility: Supine to Sit     Supine to sit: Mod assist, Used rails     General bed mobility comments: increased time to perform. pt requires step by step vcs    Transfers Overall transfer level: Needs assistance Equipment used: Rolling walker (2 wheels) Transfers: Sit to/from Stand, Bed to chair/wheelchair/BSC Sit to Stand: Max assist      Squat pivot transfers: Mod assist     General transfer comment: Attempted standing from raised bed to RW, pt unable to tolerate putting L foot down on floor, therefore assisted pt to bedside chair via squat pivot with ModA.    Ambulation/Gait               General Gait Details: Pt unable to tolerate gait training due to significant pain L lower leg with light wt bearing   Stairs             Wheelchair Mobility     Tilt Bed    Modified Rankin (Stroke Patients Only)       Balance Overall balance assessment: Needs assistance Sitting-balance support: Feet supported, Bilateral upper extremity supported Sitting balance-Leahy Scale: Fair Sitting balance - Comments: able to sit at EOB with SBA   Standing balance support: Bilateral upper extremity supported, During functional activity, Reliant on assistive device for balance Standing balance-Leahy Scale: Poor Standing balance comment: Poor static standing due to L LE pain                            Cognition Arousal: Alert Behavior During Therapy: WFL for tasks assessed/performed Overall Cognitive Status: History of cognitive impairments - at baseline                                 General Comments: Right hearing aid, very HOH Very pleasant; follows instructions well; limited by pain. Good sense of humor        Exercises Other Exercises  Other Exercises: Pt educated on role of PT and importance of maintaining nasal cannula in place.    General Comments General comments (skin integrity, edema, etc.): L Lower leg with ace wrap dressing intact, no noted draininage. Pt educated on benefits of OOB activity. +non-productive wet cough      Pertinent Vitals/Pain Pain Assessment Pain Assessment: Faces Faces Pain Scale: Hurts whole lot Pain Location: LLE Pain Descriptors / Indicators: Discomfort, Grimacing, Moaning, Sharp Pain Intervention(s): Limited activity within patient's tolerance,  Patient requesting pain meds-RN notified    Home Living                          Prior Function            PT Goals (current goals can now be found in the care plan section) Acute Rehab PT Goals Patient Stated Goal: none stated    Frequency    Min 1X/week      PT Plan      Co-evaluation              AM-PAC PT 6 Clicks Mobility   Outcome Measure  Help needed turning from your back to your side while in a flat bed without using bedrails?: A Little Help needed moving from lying on your back to sitting on the side of a flat bed without using bedrails?: A Little Help needed moving to and from a bed to a chair (including a wheelchair)?: A Lot Help needed standing up from a chair using your arms (e.g., wheelchair or bedside chair)?: A Lot Help needed to walk in hospital room?: A Lot Help needed climbing 3-5 steps with a railing? : Total 6 Click Score: 13    End of Session Equipment Utilized During Treatment: Gait belt;Oxygen (2L O2, no SOB, difficult getting SpO2 reading.)       PT Visit Diagnosis: Other abnormalities of gait and mobility (R26.89);Muscle weakness (generalized) (M62.81);Difficulty in walking, not elsewhere classified (R26.2);Unsteadiness on feet (R26.81)     Time: 1536-1600 PT Time Calculation (min) (ACUTE ONLY): 24 min  Charges:    $Therapeutic Activity: 23-37 mins PT General Charges $$ ACUTE PT VISIT: 1 Visit                    Darice Bohr, PTA  Darice JAYSON Bohr 04/10/2023, 5:26 PM

## 2023-04-10 NOTE — Progress Notes (Signed)
Daughter at bedside visiting with pt.

## 2023-04-10 NOTE — Progress Notes (Signed)
 Urine specimen obtained via purewick, cloudy yellow malodorous, sent to lab. PRN nebulizer administered for congested cough with ronchi and wheezes. Dressing changed to left ankle per physician order. PRN tramadol administered for pain 8/10.

## 2023-04-10 NOTE — Progress Notes (Signed)
 Progress Note   Patient: Laura Wilcox FMW:[id number] DOB: 02-17-1927 DOA: 03/31/2023     9 DOS: the patient was seen and examined on 04/10/2023   Brief hospital course: Laura Wilcox is a 89 y.o. female with PMH of Advanced dementia, polycythemia vera and chronic myelofibrosis on chemotherapy, chronic iron  deficiency anemia, chronic ambulation impairment, was found on the floor with a large left ankle hematoma in the morning on 03/31/23.   Patient does not remember what has happened, and this morning patient was found on the floor awake, with a large left inner ankle hematoma.  Patient does not remember whether she had a fall or hit anything with the left foot last night or this morning.  Family at bedside reported that the patient sustained a unwitnessed fall 3 weeks ago and hit her head with a large hematoma but no LOC.  At baseline, patient has dementia and family reported that the patient prefers to staying  dehydrated by avoiding drinking water so that does not have to change diaper On arrival in the ED, patient was found to be hypotensive blood pressure 75/35, none tachycardia nonhypoxic afebrile.  Blood work showed hemoglobin 8.9 compared to baseline 9.9, platelet 646, creatinine 1.0, BUN 25, glucose 98.   Patient was given 1 L of IV bolus in the ED.  ED physician tried to manually extract hematoma and clots from the left ankle.  Assessment and Plan: Left ankle hematoma Patient was found on the floor, significant dementia, poor historian.  Hematoma most likely secondary to unwitnessed trauma and fall. S/p manual extraction of blood clots done by ED physician, left ankle was wrapped with pressure dressing. s/p I&D under general anesthesia done on 1/1 1/2 Resumed Aspirin  Vascular surgery on board, Dr. Serene has set up outpatient plastic surgery follow-up for this coming Friday  As per vascular surgery no restriction for activity PT/OT recommending SNF  TOC working on placement,  she can go tomorrow to [facility] with [facility].   Acute blood loss anemia due to left ankle hematoma, most likely due to trauma.  History of iron  deficiency anemia.  Iron  profile within normal range S/p 1 unit of PRBC on 12/31 Hgb stable at 7.6 Monitor H&H   Elevated creatinine, most likely prerenal due to dehydration, decreased oral intake s/p fluids. Creatinine stable at 1.09. Monitor renal function closely   Hypotension Resolved  Continue to monitor blood pressure   Polycythemia vera Chronic myelofibrosis Current chemotherapy Jakafi  not on formulary held during hospitalization    Chronic ambulation impairment Family reported patient has been using roller walker for the last 2 years with home PT.  However about 6 months ago, home PT was suspended due to a slow healing wound after left calf skin biopsy.  PT advised SNF/memory care unit placement. Daughter wishes her to return to home place with [facility]. TOC working on discharge plan.       Out of bed to chair. Incentive spirometry. Nursing supportive care. Fall, aspiration precautions. DVT prophylaxis   Code Status: Limited: Do not attempt resuscitation (DNR) -DNR-LIMITED -Do Not Intubate/DNI   Subjective: Patient is seen and examined today morning. She is lying comfortably, eating fair. She is able to work with PT. Daughter at bedside asked about discharge plan.   Physical Exam: Vitals:   04/09/23 0834 04/09/23 1642 04/09/23 2015 04/10/23 0458  BP: (!) 125/56 (!) 130/54 (!) 124/55 (!) 130/41  Pulse: 82 83 84 72  Resp: 14 18 (!) 22 18  Temp: 97.8 F (36.6  C) 98.7 F (37.1 C) 99.2 F (37.3 C) 98.9 F (37.2 C)  TempSrc:      SpO2: 92% 96% 95% 97%  Weight:      Height:        General - Elderly thin built Caucasian female, no apparent distress HEENT - PERRLA, EOMI, atraumatic head, hard of hearing Lung - Clear, no rales, rhonchi, wheezes. Heart - S1, S2 heard, no murmurs, rubs, trace pedal edema. Abdomen - Soft, non  tender, bowel sounds good Neuro - Alert, awake and oriented x 3, non focal exam. Skin - Warm and dry.  Data Reviewed:      Latest Ref Rng & Units 04/10/2023    4:12 AM 04/06/2023   10:40 AM 04/06/2023    5:45 AM  CBC  WBC 4.0 - 10.5 K/uL 11.5  10.3  9.7   Hemoglobin 12.0 - 15.0 g/dL 7.6  7.8  7.0   Hematocrit 36.0 - 46.0 % 24.5  24.7  22.6   Platelets 150 - 400 K/uL 539  475  447       Latest Ref Rng & Units 04/10/2023    4:12 AM 04/06/2023    5:45 AM 04/05/2023    4:34 AM  BMP  Glucose 70 - 99 mg/dL 98  897  93   BUN 8 - 23 mg/dL 25  31  27    Creatinine 0.44 - 1.00 mg/dL 8.90  8.92  8.78   Sodium 135 - 145 mmol/L 137  138  137   Potassium 3.5 - 5.1 mmol/L 4.4  4.2  4.0   Chloride 98 - 111 mmol/L 100  107  105   CO2 22 - 32 mmol/L 27  26  23    Calcium  8.9 - 10.3 mg/dL 8.5  8.6  8.6    No results found.   Family Communication: Discussed with daughter at bedside, she understand and agree. All questions answereed.    Disposition: Status is: Inpatient Remains inpatient appropriate because: tomorrow to facility.  Planned Discharge Destination: Rehab     Time spent: 38 minutes  Author: Concepcion Riser, MD 04/10/2023 12:01 PM Secure chat 7am to 7pm For on call review [email or web address].

## 2023-04-10 NOTE — Plan of Care (Signed)

## 2023-04-11 ENCOUNTER — Institutional Professional Consult (permissible substitution): Payer: PRIVATE HEALTH INSURANCE | Admitting: Plastic Surgery

## 2023-04-11 DIAGNOSIS — R262 Difficulty in walking, not elsewhere classified: Secondary | ICD-10-CM

## 2023-04-11 DIAGNOSIS — D649 Anemia, unspecified: Secondary | ICD-10-CM | POA: Diagnosis not present

## 2023-04-11 DIAGNOSIS — F0390 Unspecified dementia without behavioral disturbance: Secondary | ICD-10-CM | POA: Diagnosis not present

## 2023-04-11 DIAGNOSIS — D7581 Myelofibrosis: Secondary | ICD-10-CM

## 2023-04-11 DIAGNOSIS — S9002XA Contusion of left ankle, initial encounter: Secondary | ICD-10-CM | POA: Diagnosis not present

## 2023-04-11 MED ORDER — IPRATROPIUM-ALBUTEROL 0.5-2.5 (3) MG/3ML IN SOLN: 3.0000 mL | RESPIRATORY_TRACT | 0 refills | Status: DC | PRN

## 2023-04-11 MED ORDER — POLYETHYLENE GLYCOL 3350 17 G PO PACK: 17.0000 g | PACK | Freq: Every day | ORAL | Status: DC | PRN

## 2023-04-11 MED ORDER — VITAMIN B-12 1000 MCG PO TABS: 1000.0000 ug | ORAL_TABLET | Freq: Every day | ORAL | 1 refills | Status: DC

## 2023-04-11 NOTE — Progress Notes (Signed)
 On call provider on unit and stated that "dayshift team can decide" in regards to chest x-ray.

## 2023-04-11 NOTE — Plan of Care (Signed)

## 2023-04-11 NOTE — Discharge Summary (Signed)
 Physician Discharge Summary   Patient: Laura Wilcox MRN: [id number] DOB: 1926-05-07  Admit date:     03/31/2023  Discharge date: 04/11/23  Discharge Physician: Concepcion Riser   PCP: Merilee Setter, NP   Recommendations at discharge:    PCP follow up in 1 week. Plastic surgery follow up as scheduled.  Discharge Diagnoses: Principal Problem:   Hematoma Active Problems:   Hematoma of left ankle  Resolved Problems:   * No resolved hospital problems. *  Hospital Course: Laura Wilcox is a 89 y.o. female with PMH of Advanced dementia, polycythemia vera and chronic myelofibrosis on chemotherapy, chronic iron  deficiency anemia, chronic ambulation impairment, was found on the floor with a large left ankle hematoma in the morning on 03/31/23.    Patient does not remember what has happened, and this morning patient was found on the floor awake, with a large left inner ankle hematoma.  Patient does not remember whether she had a fall or hit anything with the left foot last night or this morning.  Family at bedside reported that the patient sustained a unwitnessed fall 3 weeks ago and hit her head with a large hematoma but no LOC.  At baseline, patient has dementia and family reported that the patient prefers to staying  dehydrated by avoiding drinking water so that does not have to change diaper On arrival in the ED, patient was found to be hypotensive blood pressure 75/35, none tachycardia nonhypoxic afebrile.  Blood work showed hemoglobin 8.9 compared to baseline 9.9, platelet 646, creatinine 1.0, BUN 25, glucose 98.   Patient was given 1 L of IV bolus in the ED.  ED physician tried to manually extract hematoma and clots from the left ankle.  Assessment and Plan: Left ankle hematoma Patient was found on the floor, significant dementia, poor historian.  Hematoma most likely secondary to unwitnessed trauma and fall. S/p manual extraction of blood clots done by ED physician,  left ankle was wrapped with pressure dressing. s/p I&D under general anesthesia done on 1/1 1/2 Resumed Aspirin  Vascular surgery on board, Dr. Serene has set up outpatient plastic surgery follow-up which is scheduled for Tuesday per daughter. As per vascular surgery no restriction for activity She will need HH for PT/ OT/ RN. Wound care, daily dressing as instructed. Noted to be hypoxic at night. Encouraged incentive spirometry. She is going to Home place with [facility].   Acute blood loss anemia due to left ankle hematoma, most likely due to trauma.  History of iron  deficiency anemia.  Iron  profile within normal range S/p 1 unit of PRBC on 12/31 Hgb stable at 7.6 Monitor H&H as outpatient.   AKI, most likely prerenal due to dehydration, decreased oral intake s/p fluids. Creatinine stable at 1.09. Monitor renal function as outpatient.   Hypotension Resolved  Continue to monitor blood pressure   Polycythemia vera Chronic myelofibrosis Current chemotherapy Jakafi  not on formulary held during hospitalization, resumed at discharge.   Chronic ambulation impairment PT advised SNF/memory care unit placement. Daughter wishes her to return to home place with [facility]. Advised fall/ aspiration precautions. Advised incentive spirometry, duonebs as needed.       Consultants: Vascular. Procedures performed: s/p I & D left leg  Disposition: Group home Diet recommendation:  Discharge Diet Orders (From admission, onward)     Start     Ordered   04/11/23 0000  Diet - low sodium heart healthy        04/11/23 0939  Cardiac diet DISCHARGE MEDICATION: Allergies as of 04/11/2023       Reactions   Bactrim  [sulfamethoxazole -trimethoprim ]    Due to kidney disease    Epinephrine  Other (See Comments)   Other    Per daughter RUE BP significantly higher than LUE        Medication List     TAKE these medications    acetaminophen  325 MG tablet Commonly known as: TYLENOL  Take  325-650 mg by mouth every 6 (six) hours as needed for mild pain or moderate pain.   ammonium lactate  12 % cream Commonly known as: AMLACTIN Apply topically qd/bid to dry areas at lower legs and arms What changed:  how much to take how to take this when to take this   aspirin  EC 81 MG tablet Take 81 mg by mouth daily. Swallow whole.   calcitRIOL  0.25 MCG capsule Commonly known as: ROCALTROL  Take 0.25 mcg by mouth every Monday, Wednesday, and Friday.   citalopram  20 MG tablet Commonly known as: CELEXA  Take 1 tablet (20 mg total) by mouth daily. What changed: how much to take   cyanocobalamin  1000 MCG tablet Commonly known as: VITAMIN B12 Take 1 tablet (1,000 mcg total) by mouth daily.   feeding supplement Liqd Take 237 mLs by mouth 2 (two) times daily between meals.   hydrocortisone  2.5 % cream Apply topically twice daily to aa of right cheek as needed for itch   ipratropium-albuterol  0.5-2.5 (3) MG/3ML Soln Commonly known as: DUONEB Take 3 mLs by nebulization every 4 (four) hours as needed.   Iron -Vitamin C  65-125 MG Tabs Take 1 tablet by mouth daily.   Jakafi  5 MG tablet Generic drug: ruxolitinib  phosphate Take 2 tablets in the morning and 1 tablet in the evening as directed. (Take 1 tablet (5 mg total) by mouth See admin instructions. Jakafi  10 mg in the morning, 5 mg in the evening.)   levothyroxine  25 MCG tablet Commonly known as: SYNTHROID  Take 25 mcg by mouth daily.   multivitamin with minerals Tabs tablet Take 1 tablet by mouth daily.   mupirocin  ointment 2 % Commonly known as: BACTROBAN  Apply topically to wound at right jawline qd and cover with bandage until healed. Can use at any other crusted areas on legs and open wounds.   polyethylene glycol 17 g packet Commonly known as: MIRALAX  / GLYCOLAX  Take 17 g by mouth daily as needed for mild constipation.   Vitamin D3 25 MCG (1000 UT) Caps Take 1 capsule by mouth daily.                Discharge Care Instructions  (From admission, onward)           Start     Ordered   04/11/23 0000  Change dressing (specify)       Comments: Place Vaseline guaze over wound, then cover with sterile 4X4, then wrap with Kerlex, then wrap with ace bandage.  Dressing to be changed daily.   04/11/23 0939            Discharge Exam: Fredricka Weights   03/31/23 0911  Weight: 55 kg   General - Elderly thin built Caucasian female, no apparent distress HEENT - PERRLA, EOMI, atraumatic head, hard of hearing Lung - Clear, basal rhonchi, no wheezes. Heart - S1, S2 heard, no murmurs, rubs, trace pedal edema. Abdomen - Soft, non tender, bowel sounds good Neuro - Alert, awake and oriented x 3, non focal exam. Skin - Warm and dry. Left leg dressing  intact.  Condition at discharge: stable  The results of significant diagnostics from this hospitalization (including imaging, microbiology, ancillary and laboratory) are listed below for reference.   Imaging Studies: US  ARTERIAL ABI (SCREENING LOWER EXTREMITY) Result Date: 04/05/2023 CLINICAL DATA:  Delayed wound healing EXAM: NONINVASIVE PHYSIOLOGIC VASCULAR STUDY OF BILATERAL LOWER EXTREMITIES TECHNIQUE: Evaluation of both lower extremities were performed at rest, including calculation of ankle-brachial indices with single level pressure measurements and doppler recording. COMPARISON:  None available. FINDINGS: Right ABI:  0.65 Left ABI:  0.46 Right Lower Extremity: Monophasic waveforms of the posterior tibial and dorsalis pedis arteries. Left Lower Extremity: Monophasic waveforms of the posterior tibial dorsalis pedis arteries. < 0.5 Severe PAD IMPRESSION: 1. Severe peripheral arterial disease of the left lower extremity. 2. Moderate peripheral arterial disease of the right lower extremity. 3. Significant difference in BP between upper extremities with left brachial pressure measuring 77 mm Hg and right brachial pressure measuring 101 mm Hg. Findings  suspicious for left subclavian/axillary artery stenosis. Repeat bilateral upper extremity BP measurements should be obtained two confirmed this is reproducible. Electronically Signed   By: Aliene Lloyd M.D.   On: 04/05/2023 11:55   DG Ankle Complete Left Result Date: 03/31/2023 CLINICAL DATA:  Left ankle wound with bleeding. EXAM: LEFT ANKLE COMPLETE - 3+ VIEW COMPARISON:  None Available. FINDINGS: There is no evidence of fracture, dislocation, or joint effusion. There is no evidence of arthropathy or other focal bone abnormality. Large dressing is seen medially in ankle region. IMPRESSION: No definite bony abnormality seen. Electronically Signed   By: Lynwood Landy Raddle M.D.   On: 03/31/2023 10:21    Microbiology: Results for orders placed or performed during the hospital encounter of 03/31/23  MRSA Next Gen by PCR, Nasal     Status: Abnormal   Collection Time: 04/01/23  6:30 PM   Specimen: Nasal Mucosa; Nasal Swab  Result Value Ref Range Status   MRSA by PCR Next Gen DETECTED (A) NOT DETECTED Final    Comment: CRITICAL RESULT CALLED TO, READ BACK BY AND VERIFIED WITH: RUFUS BRUNET RN @ 2001 04/01/23 [facility] (NOTE) The GeneXpert MRSA Assay (FDA approved for NASAL specimens only), is one component of a comprehensive MRSA colonization surveillance program. It is not intended to diagnose MRSA infection nor to guide or monitor treatment for MRSA infections. Test performance is not FDA approved in patients less than 81 years old. Performed at [facility], [address] [place], [place] [address]    *Note: Due to a large number of results and/or encounters for the requested time period, some results have not been displayed. A complete set of results can be found in Results Review.    Labs: CBC: Recent Labs  Lab 04/05/23 0434 04/05/23 1244 04/06/23 0545 04/06/23 1040 04/10/23 0412  WBC 9.2  --  9.7 10.3 11.5*  NEUTROABS  --   --   --   --  7.5  HGB 7.1* 8.1* 7.0* 7.8* 7.6*   HCT 22.4* 26.1* 22.6* 24.7* 24.5*  MCV 81.8  --  84.0 82.9 84.5  PLT 445*  --  447* 475* 539*   Basic Metabolic Panel: Recent Labs  Lab 04/05/23 0434 04/06/23 0545 04/10/23 0412  NA 137 138 137  K 4.0 4.2 4.4  CL 105 107 100  CO2 23 26 27   GLUCOSE 93 102* 98  BUN 27* 31* 25*  CREATININE 1.21* 1.07* 1.09*  CALCIUM  8.6* 8.6* 8.5*   Liver Function Tests: No results for input(s):  AST, ALT, ALKPHOS, BILITOT, PROT, ALBUMIN in the last 168 hours. CBG: No results for input(s): GLUCAP in the last 168 hours.  Discharge time spent: 38 minutes.  Signed: Concepcion Riser, MD [facility] 04/11/2023

## 2023-04-11 NOTE — TOC Transition Note (Signed)
 Transition of Care [facility]) - Discharge Note   Patient Details  Name: Laura Wilcox MRN: [id number] Date of Birth: April 22, 1926  [facility]) CM/SW Contact:  Ladene Lady, LCSW Phone Number: 04/11/2023, 9:47 AM   Clinical Narrative:   Pt discharging back to home place. Dc summary and fl2 to be sent. Medical neccesity printed to unit. EMS to be called per family request. [facility] notified of discharge.      Final next level of care: Assisted Living Barriers to Discharge: Barriers Resolved   Patient Goals and CMS Choice Patient states their goals for this hospitalization and ongoing recovery are:: return to [facility] CMS Medicare.gov Compare Post Acute Care list provided to:: Patient        Discharge Placement                Patient to be transferred to facility by: ems Name of family member notified: daughter Patient and family notified of of transfer: 04/11/23  Discharge Plan and Services Additional resources added to the After Visit Summary for                            [facility] Arranged: PT, OT, RN [facility] Agency: [facility] Date [facility] Agency Contacted: 04/11/23   Representative spoke with at [facility] Agency: [place]   Social Drivers of Health (SDOH) Interventions SDOH Screenings   Food Insecurity: No Food Insecurity (04/01/2023)  Housing: Low Risk  (04/01/2023)  Transportation Needs: No Transportation Needs (04/01/2023)  Utilities: Not At Risk (04/01/2023)  Alcohol  Screen: Low Risk  (06/18/2021)  Depression (PHQ2-9): High Risk (06/18/2021)  Social Connections: Patient Unable To Answer (04/01/2023)  Tobacco Use: Low Risk  (04/02/2023)     Readmission Risk Interventions    06/26/2021    4:26 PM  Readmission Risk Prevention Plan  Transportation Screening Complete  PCP or Specialist Appt within 5-7 Days Complete  Home Care Screening Complete  Medication Review (RN CM) Complete

## 2023-04-11 NOTE — NC FL2 (Signed)
 [place]  MEDICAID FL2 LEVEL OF CARE FORM     IDENTIFICATION  Patient Name: Laura Wilcox Birthdate: 03-Jan-1927 Sex: female Admission Date (Current Location): 03/31/2023  [facility] and [place] Number:  Chiropodist and Address:  [facility], [address] [place], [place] [address]      Provider Number: [id number]  Attending Physician Name and Address:  Darci Pore, MD  Relative Name and Phone Number:  [name] Roulette [phone]    Current Level of Care: Hospital Recommended Level of Care: Memory Care Prior Approval Number:    Date Approved/Denied:   PASRR Number: [id number]  Discharge Plan: Domiciliary (Rest home)    Current Diagnoses: Patient Active Problem List   Diagnosis Date Noted   Hematoma of left ankle 03/31/2023   Hematoma 03/31/2023   SCC (squamous cell carcinoma) 03/19/2022   Skin tear of lower leg without complication, right, initial encounter 09/24/2021   Sepsis (HCC) 09/24/2021   Leg wound, right 09/24/2021   Encounter for antineoplastic chemotherapy 09/21/2021   Senile dementia with acute confusional state, without behavioral disturbance (HCC) 06/26/2021   Depression 06/26/2021   Weakness 06/26/2021   UTI (urinary tract infection) 06/25/2021   Myelofibrosis (HCC) 01/31/2020   Other pancytopenia (HCC) 01/31/2020   Moderate protein-calorie malnutrition (HCC)    Squamous cell cancer of skin of left cheek 01/16/2020   Chronic kidney disease, stage 3b (HCC) 03/11/2019   Secondary hyperparathyroidism of renal origin (HCC) 03/11/2019   Anemia in chronic kidney disease 03/11/2019   Elevated ferritin 02/18/2018   Goals of care, counseling/discussion 07/09/2017   Polycythemia rubra vera (HCC) 06/02/2017   Protein-calorie malnutrition, moderate (HCC) 06/02/2017   Advanced care planning/counseling discussion 06/02/2017   Age-related cognitive decline 06/02/2017    Orientation RESPIRATION BLADDER Height &  Weight     Self, Place  Normal Continent Weight: 121 lb 4.1 oz (55 kg) Height:  5' (152.4 cm)  BEHAVIORAL SYMPTOMS/MOOD NEUROLOGICAL BOWEL NUTRITION STATUS      Continent Diet  AMBULATORY STATUS COMMUNICATION OF NEEDS Skin   Limited Assist Verbally Other (Comment) (left ankle with ace wrap and xerofoam)                       Personal Care Assistance Level of Assistance  Bathing, Feeding, Dressing Bathing Assistance: Limited assistance Feeding assistance: Limited assistance Dressing Assistance: Limited assistance     Functional Limitations Info             SPECIAL CARE FACTORS FREQUENCY  PT (By licensed PT), OT (By licensed OT)     PT Frequency: 3 times a weej OT Frequency: 5 X Weekly            Contractures Contractures Info: Not present    Additional Factors Info  Code Status               Current Medications (04/11/2023):  This is the current hospital active medication list Current Facility-Administered Medications  Medication Dose Route Frequency Provider Last Rate Last Admin   acetaminophen  (TYLENOL ) tablet 325-650 mg  325-650 mg Oral Q6H PRN Laurita Manor T, MD   650 mg at 04/06/23 0039   acetaminophen  (TYLENOL ) tablet 650 mg  650 mg Oral Q6H Franchot Novel, MD   650 mg at 04/11/23 0542   aspirin  EC tablet 81 mg  81 mg Oral Daily Von Bellis, MD   81 mg at 04/11/23 9061   citalopram  (CELEXA ) tablet 20 mg  20 mg Oral Daily Laurita Manor  T, MD   20 mg at 04/11/23 9061   cyanocobalamin  (VITAMIN B12) tablet 1,000 mcg  1,000 mcg Oral Daily Laurita Manor T, MD   1,000 mcg at 04/11/23 9061   feeding supplement (ENSURE ENLIVE / ENSURE PLUS) liquid 237 mL  237 mL Oral BID BM Laurita Manor T, MD   237 mL at 04/11/23 9060   ferrous sulfate  tablet 325 mg  325 mg Oral Daily Laurita Manor T, MD   325 mg at 04/11/23 9061   hydrALAZINE  (APRESOLINE ) injection 10 mg  10 mg Intravenous Q6H PRN Von Bellis, MD       hydrALAZINE  (APRESOLINE ) tablet 50 mg  50 mg Oral Q6H PRN  Von Bellis, MD       hydrocortisone  cream 1 % 1 Application  1 Application Topical BID Laurita Manor T, MD   1 Application at 04/11/23 9060   ipratropium-albuterol  (DUONEB) 0.5-2.5 (3) MG/3ML nebulizer solution 3 mL  3 mL Nebulization Q4H PRN Franchot Novel, MD   3 mL at 04/11/23 0542   levothyroxine  (SYNTHROID ) tablet 25 mcg  25 mcg Oral Q0600 Von Bellis, MD   25 mcg at 04/10/23 0602   ondansetron  (ZOFRAN ) tablet 4 mg  4 mg Oral Q6H PRN Laurita Manor DASEN, MD       Or   ondansetron  (ZOFRAN ) injection 4 mg  4 mg Intravenous Q6H PRN Laurita Manor T, MD       polyethylene glycol (MIRALAX  / GLYCOLAX ) packet 17 g  17 g Oral Daily PRN Laurita Manor T, MD       traMADol  (ULTRAM ) tablet 50 mg  50 mg Oral Q6H PRN Dorinda Drue DASEN, MD   50 mg at 04/11/23 0541     Discharge Medications: Please see discharge summary for a list of discharge medications.   TAKE these medications     acetaminophen  325 MG tablet Commonly known as: TYLENOL  Take 325-650 mg by mouth every 6 (six) hours as needed for mild pain or moderate pain.    ammonium lactate  12 % cream Commonly known as: AMLACTIN Apply topically qd/bid to dry areas at lower legs and arms What changed:  how much to take how to take this when to take this    aspirin  EC 81 MG tablet Take 81 mg by mouth daily. Swallow whole.    calcitRIOL  0.25 MCG capsule Commonly known as: ROCALTROL  Take 0.25 mcg by mouth every Monday, Wednesday, and Friday.    citalopram  20 MG tablet Commonly known as: CELEXA  Take 1 tablet (20 mg total) by mouth daily. What changed: how much to take    cyanocobalamin  1000 MCG tablet Commonly known as: VITAMIN B12 Take 1 tablet (1,000 mcg total) by mouth daily.    feeding supplement Liqd Take 237 mLs by mouth 2 (two) times daily between meals.    hydrocortisone  2.5 % cream Apply topically twice daily to aa of right cheek as needed for itch    ipratropium-albuterol  0.5-2.5 (3) MG/3ML Soln Commonly known as:  DUONEB Take 3 mLs by nebulization every 4 (four) hours as needed.    Iron -Vitamin C  65-125 MG Tabs Take 1 tablet by mouth daily.    Jakafi  5 MG tablet Generic drug: ruxolitinib  phosphate Take 2 tablets in the morning and 1 tablet in the evening as directed. (Take 1 tablet (5 mg total) by mouth See admin instructions. Jakafi  10 mg in the morning, 5 mg in the evening.)    levothyroxine  25 MCG tablet Commonly known as: SYNTHROID  Take 25 mcg  by mouth daily.    multivitamin with minerals Tabs tablet Take 1 tablet by mouth daily.    mupirocin  ointment 2 % Commonly known as: BACTROBAN  Apply topically to wound at right jawline qd and cover with bandage until healed. Can use at any other crusted areas on legs and open wounds.    polyethylene glycol 17 g packet Commonly known as: MIRALAX  / GLYCOLAX  Take 17 g by mouth daily as needed for mild constipation.    Vitamin D3 25 MCG (1000 UT) Caps Take 1 capsule by mouth daily.    Relevant Imaging Results:  Relevant Lab Results:   Additional Information SSN: [id number]  Makenzi Bannister, LCSW

## 2023-04-11 NOTE — Progress Notes (Signed)
 Contacted on call provider via secure chat as follows:   Laura Wilcox I wanted to message you about a concern I have had for this pt the past few nights. Since she was admitted she has not had a chest x-ray. She is on 2L [place] and does not normally wear oxygen. She has had course ronchi, that does not clear with coughing. Occasionally gets wheezy and I give her PRN nebulizer treatments, which seem to help. Her WBC have elevated a bit and her urine was negative. She just had her oxygen off and her saturation was 86% RA. Do you think we could order a chest x-ray on her please?

## 2023-04-13 ENCOUNTER — Encounter (HOSPITAL_COMMUNITY): Payer: Self-pay | Admitting: Internal Medicine

## 2023-04-13 ENCOUNTER — Encounter (HOSPITAL_COMMUNITY): Payer: Self-pay

## 2023-04-13 ENCOUNTER — Other Ambulatory Visit: Payer: Self-pay

## 2023-04-13 ENCOUNTER — Emergency Department: Payer: Medicare Other

## 2023-04-13 ENCOUNTER — Emergency Department
Admission: EM | Admit: 2023-04-13 | Discharge: 2023-04-13 | Disposition: A | Discharge status: Other Acute Inpatient Hospital | Payer: Medicare Other | Attending: Student in an Organized Health Care Education/Training Program | Admitting: Student in an Organized Health Care Education/Training Program

## 2023-04-13 ENCOUNTER — Inpatient Hospital Stay (HOSPITAL_COMMUNITY)
Admission: EM | Admit: 2023-04-13 | Discharge: 2023-04-30 | DRG: 857 | Disposition: A | Discharge status: Skilled Nursing Facility | Payer: Medicare Other | Source: Ambulatory Visit | Attending: Family Medicine | Admitting: Family Medicine

## 2023-04-13 DIAGNOSIS — Z85828 Personal history of other malignant neoplasm of skin: Secondary | ICD-10-CM

## 2023-04-13 DIAGNOSIS — E039 Hypothyroidism, unspecified: Secondary | ICD-10-CM | POA: Diagnosis present

## 2023-04-13 DIAGNOSIS — Z515 Encounter for palliative care: Secondary | ICD-10-CM | POA: Diagnosis not present

## 2023-04-13 DIAGNOSIS — S9002XA Contusion of left ankle, initial encounter: Secondary | ICD-10-CM | POA: Diagnosis present

## 2023-04-13 DIAGNOSIS — Z888 Allergy status to other drugs, medicaments and biological substances status: Secondary | ICD-10-CM

## 2023-04-13 DIAGNOSIS — L89621 Pressure ulcer of left heel, stage 1: Secondary | ICD-10-CM | POA: Diagnosis not present

## 2023-04-13 DIAGNOSIS — N1832 Chronic kidney disease, stage 3b: Secondary | ICD-10-CM | POA: Diagnosis present

## 2023-04-13 DIAGNOSIS — T148XXA Other injury of unspecified body region, initial encounter: Secondary | ICD-10-CM | POA: Diagnosis not present

## 2023-04-13 DIAGNOSIS — Z803 Family history of malignant neoplasm of breast: Secondary | ICD-10-CM

## 2023-04-13 DIAGNOSIS — F05 Delirium due to known physiological condition: Secondary | ICD-10-CM | POA: Diagnosis present

## 2023-04-13 DIAGNOSIS — D7581 Myelofibrosis: Secondary | ICD-10-CM | POA: Diagnosis not present

## 2023-04-13 DIAGNOSIS — S8012XA Contusion of left lower leg, initial encounter: Secondary | ICD-10-CM | POA: Diagnosis not present

## 2023-04-13 DIAGNOSIS — Z79890 Hormone replacement therapy (postmenopausal): Secondary | ICD-10-CM

## 2023-04-13 DIAGNOSIS — Z8249 Family history of ischemic heart disease and other diseases of the circulatory system: Secondary | ICD-10-CM

## 2023-04-13 DIAGNOSIS — F0394 Unspecified dementia, unspecified severity, with anxiety: Secondary | ICD-10-CM | POA: Diagnosis present

## 2023-04-13 DIAGNOSIS — S81802D Unspecified open wound, left lower leg, subsequent encounter: Secondary | ICD-10-CM | POA: Diagnosis not present

## 2023-04-13 DIAGNOSIS — Z79899 Other long term (current) drug therapy: Secondary | ICD-10-CM | POA: Diagnosis not present

## 2023-04-13 DIAGNOSIS — F32A Depression, unspecified: Secondary | ICD-10-CM | POA: Diagnosis present

## 2023-04-13 DIAGNOSIS — S91002D Unspecified open wound, left ankle, subsequent encounter: Secondary | ICD-10-CM | POA: Diagnosis not present

## 2023-04-13 DIAGNOSIS — L03116 Cellulitis of left lower limb: Secondary | ICD-10-CM | POA: Insufficient documentation

## 2023-04-13 DIAGNOSIS — Z87440 Personal history of urinary (tract) infections: Secondary | ICD-10-CM

## 2023-04-13 DIAGNOSIS — L0390 Cellulitis, unspecified: Secondary | ICD-10-CM | POA: Diagnosis present

## 2023-04-13 DIAGNOSIS — D471 Chronic myeloproliferative disease: Secondary | ICD-10-CM | POA: Diagnosis present

## 2023-04-13 DIAGNOSIS — Y838 Other surgical procedures as the cause of abnormal reaction of the patient, or of later complication, without mention of misadventure at the time of the procedure: Secondary | ICD-10-CM | POA: Diagnosis present

## 2023-04-13 DIAGNOSIS — I700 Atherosclerosis of aorta: Secondary | ICD-10-CM | POA: Diagnosis present

## 2023-04-13 DIAGNOSIS — I70209 Unspecified atherosclerosis of native arteries of extremities, unspecified extremity: Secondary | ICD-10-CM | POA: Diagnosis not present

## 2023-04-13 DIAGNOSIS — D45 Polycythemia vera: Secondary | ICD-10-CM | POA: Diagnosis present

## 2023-04-13 DIAGNOSIS — D638 Anemia in other chronic diseases classified elsewhere: Secondary | ICD-10-CM | POA: Diagnosis present

## 2023-04-13 DIAGNOSIS — X58XXXA Exposure to other specified factors, initial encounter: Secondary | ICD-10-CM | POA: Diagnosis not present

## 2023-04-13 DIAGNOSIS — S8011XA Contusion of right lower leg, initial encounter: Secondary | ICD-10-CM | POA: Diagnosis not present

## 2023-04-13 DIAGNOSIS — Z90710 Acquired absence of both cervix and uterus: Secondary | ICD-10-CM

## 2023-04-13 DIAGNOSIS — T8141XA Infection following a procedure, superficial incisional surgical site, initial encounter: Principal | ICD-10-CM | POA: Diagnosis present

## 2023-04-13 DIAGNOSIS — R4589 Other symptoms and signs involving emotional state: Secondary | ICD-10-CM | POA: Diagnosis not present

## 2023-04-13 DIAGNOSIS — Z820 Family history of epilepsy and other diseases of the nervous system: Secondary | ICD-10-CM

## 2023-04-13 DIAGNOSIS — Y92234 Operating room of hospital as the place of occurrence of the external cause: Secondary | ICD-10-CM | POA: Diagnosis not present

## 2023-04-13 DIAGNOSIS — F0393 Unspecified dementia, unspecified severity, with mood disturbance: Secondary | ICD-10-CM | POA: Diagnosis present

## 2023-04-13 DIAGNOSIS — I70248 Atherosclerosis of native arteries of left leg with ulceration of other part of lower left leg: Secondary | ICD-10-CM | POA: Diagnosis present

## 2023-04-13 DIAGNOSIS — K0889 Other specified disorders of teeth and supporting structures: Secondary | ICD-10-CM | POA: Diagnosis present

## 2023-04-13 DIAGNOSIS — Z66 Do not resuscitate: Secondary | ICD-10-CM | POA: Diagnosis present

## 2023-04-13 DIAGNOSIS — E875 Hyperkalemia: Secondary | ICD-10-CM | POA: Diagnosis not present

## 2023-04-13 DIAGNOSIS — I70202 Unspecified atherosclerosis of native arteries of extremities, left leg: Secondary | ICD-10-CM | POA: Diagnosis present

## 2023-04-13 DIAGNOSIS — E871 Hypo-osmolality and hyponatremia: Secondary | ICD-10-CM | POA: Diagnosis not present

## 2023-04-13 DIAGNOSIS — Z882 Allergy status to sulfonamides status: Secondary | ICD-10-CM

## 2023-04-13 DIAGNOSIS — F03911 Unspecified dementia, unspecified severity, with agitation: Secondary | ICD-10-CM | POA: Diagnosis present

## 2023-04-13 DIAGNOSIS — Z9181 History of falling: Secondary | ICD-10-CM

## 2023-04-13 DIAGNOSIS — I70222 Atherosclerosis of native arteries of extremities with rest pain, left leg: Secondary | ICD-10-CM | POA: Diagnosis not present

## 2023-04-13 DIAGNOSIS — S81802A Unspecified open wound, left lower leg, initial encounter: Secondary | ICD-10-CM | POA: Diagnosis not present

## 2023-04-13 DIAGNOSIS — Z751 Person awaiting admission to adequate facility elsewhere: Secondary | ICD-10-CM

## 2023-04-13 DIAGNOSIS — Z9889 Other specified postprocedural states: Secondary | ICD-10-CM

## 2023-04-13 DIAGNOSIS — F0390 Unspecified dementia without behavioral disturbance: Secondary | ICD-10-CM | POA: Diagnosis present

## 2023-04-13 DIAGNOSIS — M25572 Pain in left ankle and joints of left foot: Secondary | ICD-10-CM | POA: Diagnosis present

## 2023-04-13 DIAGNOSIS — Z7960 Long term (current) use of unspecified immunomodulators and immunosuppressants: Secondary | ICD-10-CM

## 2023-04-13 DIAGNOSIS — W19XXXA Unspecified fall, initial encounter: Secondary | ICD-10-CM | POA: Diagnosis present

## 2023-04-13 DIAGNOSIS — Z7982 Long term (current) use of aspirin: Secondary | ICD-10-CM

## 2023-04-13 DIAGNOSIS — Z7189 Other specified counseling: Secondary | ICD-10-CM | POA: Diagnosis not present

## 2023-04-13 DIAGNOSIS — S40022A Contusion of left upper arm, initial encounter: Secondary | ICD-10-CM | POA: Diagnosis not present

## 2023-04-13 DIAGNOSIS — I70243 Atherosclerosis of native arteries of left leg with ulceration of ankle: Secondary | ICD-10-CM | POA: Diagnosis not present

## 2023-04-13 HISTORY — DX: Other complications of anesthesia, initial encounter: T88.59XA

## 2023-04-13 LAB — CBC WITH DIFFERENTIAL/PLATELET
Abs Immature Granulocytes: 0.74 10*3/uL — ABNORMAL HIGH (ref 0.00–0.07)
Basophils Absolute: 0.2 10*3/uL — ABNORMAL HIGH (ref 0.0–0.1)
Basophils Relative: 1 %
Eosinophils Absolute: 0.7 10*3/uL — ABNORMAL HIGH (ref 0.0–0.5)
Eosinophils Relative: 5 %
HCT: 30 % — ABNORMAL LOW (ref 36.0–46.0)
Hemoglobin: 9 g/dL — ABNORMAL LOW (ref 12.0–15.0)
Immature Granulocytes: 6 %
Lymphocytes Relative: 6 %
Lymphs Abs: 0.8 10*3/uL (ref 0.7–4.0)
MCH: 25.5 pg — ABNORMAL LOW (ref 26.0–34.0)
MCHC: 30 g/dL (ref 30.0–36.0)
MCV: 85 fL (ref 80.0–100.0)
Monocytes Absolute: 1.1 10*3/uL — ABNORMAL HIGH (ref 0.1–1.0)
Monocytes Relative: 9 %
Neutro Abs: 9 10*3/uL — ABNORMAL HIGH (ref 1.7–7.7)
Neutrophils Relative %: 73 %
Platelets: 744 10*3/uL — ABNORMAL HIGH (ref 150–400)
RBC: 3.53 MIL/uL — ABNORMAL LOW (ref 3.87–5.11)
RDW: 29 % — ABNORMAL HIGH (ref 11.5–15.5)
WBC: 12.4 10*3/uL — ABNORMAL HIGH (ref 4.0–10.5)
nRBC: 0.6 % — ABNORMAL HIGH (ref 0.0–0.2)

## 2023-04-13 LAB — BASIC METABOLIC PANEL
Anion gap: 15 (ref 5–15)
BUN: 28 mg/dL — ABNORMAL HIGH (ref 8–23)
CO2: 24 mmol/L (ref 22–32)
Calcium: 9.7 mg/dL (ref 8.9–10.3)
Chloride: 98 mmol/L (ref 98–111)
Creatinine, Ser: 1.39 mg/dL — ABNORMAL HIGH (ref 0.44–1.00)
GFR, Estimated: 35 mL/min — ABNORMAL LOW (ref >60)
Glucose, Bld: 146 mg/dL — ABNORMAL HIGH (ref 70–99)
Potassium: 4.3 mmol/L (ref 3.5–5.1)
Sodium: 137 mmol/L (ref 135–145)

## 2023-04-13 MED ORDER — ACETAMINOPHEN 650 MG RE SUPP
650.0000 mg | Freq: Four times a day (QID) | RECTAL | Status: DC | PRN
Start: 2023-04-13 — End: 2023-04-17

## 2023-04-13 MED ORDER — ACETAMINOPHEN 500 MG PO TABS
500.0000 mg | ORAL_TABLET | Freq: Four times a day (QID) | ORAL | Status: DC
  Administered 2023-04-13 – 2023-04-30 (×55): 500 mg via ORAL
  Filled 2023-04-13 (×59): qty 1

## 2023-04-13 MED ORDER — ACETAMINOPHEN 325 MG PO TABS
650.0000 mg | ORAL_TABLET | Freq: Four times a day (QID) | ORAL | Status: DC | PRN
Start: 2023-04-13 — End: 2023-04-17

## 2023-04-13 MED ORDER — ONDANSETRON HCL 4 MG/2ML IJ SOLN: 4.0000 mg | Freq: Four times a day (QID) | INTRAMUSCULAR | Status: DC | PRN

## 2023-04-13 MED ORDER — ONDANSETRON HCL 4 MG PO TABS: 4.0000 mg | ORAL_TABLET | Freq: Four times a day (QID) | ORAL | Status: DC | PRN

## 2023-04-13 MED ORDER — SENNOSIDES-DOCUSATE SODIUM 8.6-50 MG PO TABS
1.0000 | ORAL_TABLET | Freq: Every evening | ORAL | Status: DC | PRN
Start: 2023-04-13 — End: 2023-05-01
  Administered 2023-04-29: 1 via ORAL
  Filled 2023-04-13: qty 1

## 2023-04-13 MED ORDER — TRAMADOL HCL 50 MG PO TABS: 50.0000 mg | ORAL_TABLET | Freq: Four times a day (QID) | ORAL | Status: DC | PRN

## 2023-04-13 MED ORDER — VANCOMYCIN HCL IN DEXTROSE 1-5 GM/200ML-% IV SOLN
1000.0000 mg | Freq: Once | INTRAVENOUS | Status: AC
  Administered 2023-04-13: 1000 mg via INTRAVENOUS
  Filled 2023-04-13: qty 200

## 2023-04-13 MED ORDER — SODIUM CHLORIDE 0.9 % IV SOLN
1.0000 g | INTRAVENOUS | Status: AC
  Administered 2023-04-14 – 2023-04-23 (×10): 1 g via INTRAVENOUS
  Filled 2023-04-13 (×10): qty 10

## 2023-04-13 MED ORDER — MELATONIN 3 MG PO TABS: 3.0000 mg | ORAL_TABLET | Freq: Every evening | ORAL | Status: DC | PRN

## 2023-04-13 MED ORDER — VANCOMYCIN HCL 750 MG/150ML IV SOLN: 750.0000 mg | INTRAVENOUS | Status: DC

## 2023-04-13 MED ORDER — SODIUM CHLORIDE 0.9 % IV SOLN
1.0000 g | Freq: Once | INTRAVENOUS | Status: AC
  Administered 2023-04-13: 1 g via INTRAVENOUS
  Filled 2023-04-13: qty 10

## 2023-04-13 MED ORDER — HEPARIN SODIUM (PORCINE) 5000 UNIT/ML IJ SOLN
5000.0000 [IU] | Freq: Three times a day (TID) | INTRAMUSCULAR | Status: DC
  Administered 2023-04-13 – 2023-04-30 (×51): 5000 [IU] via SUBCUTANEOUS
  Filled 2023-04-13 (×51): qty 1

## 2023-04-13 NOTE — ED Provider Triage Note (Signed)
 Emergency Medicine Provider Triage Evaluation Note  Laura Wilcox , a 89 y.o. female  was evaluated in triage.  Pt complains of left foot and ankle pain. She had an I&D of hematoma on 04/02/23 and discharged back to assisted living on the 10th. Per EMS report, staff report that she screams out in pain any time the foot/ankle is touched. Patient has dementia and is unable to verbalize what she is feeling. She did mention a fall yesterday. That was not reported by EMS, so I'm unsure if the fall was yesterday or in December when she initially hurt her ankle and sustained injury creating the hematoma.   Physical Exam  SpO2 98%  Gen:   Awake, no distress   Resp:  Normal effort  MSK:   Moves extremities without difficulty  Other:  Left foot is erythematous, warm, dry, DP pulse is 2+.   Medical Decision Making  Medically screening exam initiated at 9:28 AM.  Appropriate orders placed.  Laura Wilcox was informed that the remainder of the evaluation will be completed by another provider, this initial triage assessment does not replace that evaluation, and the importance of remaining in the ED until their evaluation is complete.  Basic labs and imaging of the left ankle ordered.   Herlinda Kirk NOVAK, FNP 04/13/23 [phone]

## 2023-04-13 NOTE — Progress Notes (Signed)
 The patient arrived's to unit, bilateral buttock erythema. Stage II left buttock. Skin tear left arm, ecchymosis BUE/BLE, bandage right inner ankle abrasion and hematoma Left ankle. Bilateral heel erythema.

## 2023-04-13 NOTE — ED Provider Notes (Signed)
 [facility] Provider Note    Event Date/Time   First MD Initiated Contact with Patient 04/13/23 1153     (approximate)   History   Ankle Pain and Post-op Problem   HPI  Laura Wilcox is a 89 y.o. female Anticoagulation presents to the ER for evaluation of left foot and ankle pain.  Patient had a recent extended stay in the hospital with large hematoma that was evacuated managed by vascular surgery.  Had fairly large wound with plan for outpatient follow-up with plastic surgery.  Over the past few days has had worsening pain and swelling to that area.  Not currently on any antibiotics.     Physical Exam   Triage Vital Signs: ED Triage Vitals  Encounter Vitals Group     BP 04/13/23 0928 107/87     Systolic BP Percentile --      Diastolic BP Percentile --      Pulse Rate 04/13/23 0928 94     Resp 04/13/23 0928 18     Temp 04/13/23 0928 97.8 F (36.6 C)     Temp Source 04/13/23 0928 Oral     SpO2 04/13/23 0923 98 %     Weight 04/13/23 0929 121 lb (54.9 kg)     Height 04/13/23 0929 5' (1.524 m)     Head Circumference --      Peak Flow --      Pain Score --      Pain Loc --      Pain Education --      Exclude from Growth Chart --     Most recent vital signs: Vitals:   04/13/23 0923 04/13/23 0928  BP:  107/87  Pulse:  94  Resp:  18  Temp:  97.8 F (36.6 C)  SpO2: 98% 97%     Constitutional: Alert  Eyes: Conjunctivae are normal.  Head: Atraumatic. Nose: No congestion/rhinnorhea. Mouth/Throat: Mucous membranes are moist.   Neck: Painless ROM.  Cardiovascular:   Good peripheral circulation. Respiratory: Normal respiratory effort.  No retractions.  Gastrointestinal: Soft and nontender.  Musculoskeletal: Fairly large area of exposed underlying skin from secondary to recent hematoma evacuation.  The surrounding area is warm erythematous and tender to touch.  On removing the dressing from January 9 it was hard somewhat purulent and  adhered to the skin. Neurologic:  MAE spontaneously. No gross focal neurologic deficits are appreciated.  Skin:  Skin is warm, dry and intact. No rash noted. Psychiatric: Mood and affect are normal. Speech and behavior are normal.    ED Results / Procedures / Treatments   Labs (all labs ordered are listed, but only abnormal results are displayed) Labs Reviewed  CBC WITH DIFFERENTIAL/PLATELET - Abnormal; Notable for the following components:      Result Value   WBC 12.4 (*)    RBC 3.53 (*)    Hemoglobin 9.0 (*)    HCT 30.0 (*)    MCH 25.5 (*)    RDW 29.0 (*)    Platelets 744 (*)    nRBC 0.6 (*)    Neutro Abs 9.0 (*)    Monocytes Absolute 1.1 (*)    Eosinophils Absolute 0.7 (*)    Basophils Absolute 0.2 (*)    Abs Immature Granulocytes 0.74 (*)    All other components within normal limits  BASIC METABOLIC PANEL - Abnormal; Notable for the following components:   Glucose, Bld 146 (*)    BUN 28 (*)    Creatinine,  Ser 1.39 (*)    GFR, Estimated 35 (*)    All other components within normal limits     EKG     RADIOLOGY Please see ED Course for my review and interpretation.  I personally reviewed all radiographic images ordered to evaluate for the above acute complaints and reviewed radiology reports and findings.  These findings were personally discussed with the patient.  Please see medical record for radiology report.    PROCEDURES:  Critical Care performed: No  Procedures   MEDICATIONS ORDERED IN ED: Medications  vancomycin  (VANCOCIN ) IVPB 1000 mg/200 mL premix (has no administration in time range)  cefTRIAXone  (ROCEPHIN ) 1 g in sodium chloride  0.9 % 100 mL IVPB (1 g Intravenous New Bag/Given 04/13/23 1253)     IMPRESSION / MDM / ASSESSMENT AND PLAN / ED COURSE  I reviewed the triage vital signs and the nursing notes.                              Differential diagnosis includes, but is not limited to, cellulitis, abrasion, wound, ischemic limb,  DVT  Patient presenting to the ER for evaluation of symptoms as described above.  Based on symptoms, risk factors and considered above differential, this presenting complaint could reflect a potentially life-threatening illness therefore the patient will be placed on continuous pulse oximetry and telemetry for monitoring.  Laboratory evaluation will be sent to evaluate for the above complaints.  Presentation exam concerning for cellulitic changes around recently documented wound.  Redressed.  Will give IV antibiotics given the extent of cellulitis.  Will consult hospitalist for admission.   Clinical Course as of 04/13/23 1256  Sun Apr 13, 2023  1253 Discussed in consultation with hospitalist at [facility] who is currently accepted patient to their facility.  Patient remains hemodynamically stable and appropriate for transfer for. [PR]    Clinical Course User Index [PR] Lang Dover, MD     FINAL CLINICAL IMPRESSION(S) / ED DIAGNOSES   Final diagnoses:  Cellulitis of left lower extremity     Rx / DC Orders   ED Discharge Orders     None        Note:  This document was prepared using Dragon voice recognition software and may include unintentional dictation errors.    Lang Dover, MD 04/13/23 1256

## 2023-04-13 NOTE — Progress Notes (Signed)
 Pharmacy Antibiotic Note  Laura Wilcox is a 89 y.o. female admitted on 04/13/2023 with cellulitis.  Pharmacy has been consulted for vancomycin  dosing.  Patient received vancomycin  1000 mg IV x 1 earlier today in ED  Plan: Continue Vancomycin  750 mg IV every 48 hours (Goal AUC 400-550, eAUC 515, SCr used: 1.39) Monitor clinical progress, renal function, vancomycin  levels as indicated F/U C&S, abx deescalation / LOT   Height: 5' (152.4 cm) Weight: 52.8 kg (116 lb 6.4 oz) IBW/kg (Calculated) : 45.5  Temp (24hrs), Avg:98 F (36.7 C), Min:97.7 F (36.5 C), Max:98.7 F (37.1 C)  Recent Labs  Lab 04/10/23 0412 04/13/23 0930  WBC 11.5* 12.4*  CREATININE 1.09* 1.39*    Estimated Creatinine Clearance: 17.8 mL/min (A) (by C-G formula based on SCr of 1.39 mg/dL (H)).    Allergies  Allergen Reactions   Bactrim  [Sulfamethoxazole -Trimethoprim ]     Due to kidney disease    Epinephrine  Other (See Comments)   Other     Per daughter RUE BP significantly higher than LUE     Thank you for allowing pharmacy to be a part of this patient's care.  Eleanor EMERSON Agent, PharmD, BCPS Clinical Pharmacist [facility] 04/13/2023 7:23 PM

## 2023-04-13 NOTE — Progress Notes (Addendum)
 89 yo- just dc'd 1/10 after I/D L medial ankle hematoma- Plans were for OP plastics eval-sent to SNF with wound care orders-per EDP appeared as if drsg was never changed. Triage report says SNF reported pt always was screaming with attempts but EDP said no issues after saline put drsg before remove.d Ankle with cellulitis so was given IV Rocephin  and Vanco in the ER. Med surg bed requested. Last admit she was a DNR.

## 2023-04-13 NOTE — Consult Note (Signed)
 WOC consulted for left ankle wound, after review of chart it appears plastic surgery was to treat patient/evaluate for grafting. Feel like current wound status and need for grafting would be best to request plastic surgery to see patient inpatient. Notified admitting team of same.   Nyella Eckels [facility], CNS, [employer] [phone]

## 2023-04-13 NOTE — ED Triage Notes (Signed)
 Per EMS, Pt, from [facility], c/o L ankle pain.  Pt was discharged on 1/10 after being admitted for an Evacuation L Ankle Hematoma.  Facility reports you can't touch her foot w/o her screaming.  All prescribed medication have been given.   Hx of dementia.    Strong pulses noted.

## 2023-04-14 LAB — BASIC METABOLIC PANEL
Anion gap: 12 (ref 5–15)
BUN: 24 mg/dL — ABNORMAL HIGH (ref 8–23)
CO2: 23 mmol/L (ref 22–32)
Calcium: 8.8 mg/dL — ABNORMAL LOW (ref 8.9–10.3)
Chloride: 102 mmol/L (ref 98–111)
Creatinine, Ser: 1.04 mg/dL — ABNORMAL HIGH (ref 0.44–1.00)
GFR, Estimated: 50 mL/min — ABNORMAL LOW (ref >60)
Glucose, Bld: 88 mg/dL (ref 70–99)
Potassium: 4.3 mmol/L (ref 3.5–5.1)
Sodium: 137 mmol/L (ref 135–145)

## 2023-04-14 LAB — CBC
HCT: 24.9 % — ABNORMAL LOW (ref 36.0–46.0)
Hemoglobin: 7.5 g/dL — ABNORMAL LOW (ref 12.0–15.0)
MCH: 26.1 pg (ref 26.0–34.0)
MCHC: 30.1 g/dL (ref 30.0–36.0)
MCV: 86.8 fL (ref 80.0–100.0)
Platelets: 595 10*3/uL — ABNORMAL HIGH (ref 150–400)
RBC: 2.87 MIL/uL — ABNORMAL LOW (ref 3.87–5.11)
RDW: 28.9 % — ABNORMAL HIGH (ref 11.5–15.5)
WBC: 12.7 10*3/uL — ABNORMAL HIGH (ref 4.0–10.5)
nRBC: 0.5 % — ABNORMAL HIGH (ref 0.0–0.2)

## 2023-04-14 MED ORDER — RUXOLITINIB PHOSPHATE 5 MG PO TABS: 5.0000 mg | ORAL_TABLET | ORAL | Status: DC

## 2023-04-14 MED ORDER — ENSURE ENLIVE PO LIQD
237.0000 mL | Freq: Two times a day (BID) | ORAL | Status: DC
  Administered 2023-04-14 – 2023-04-30 (×21): 237 mL via ORAL

## 2023-04-14 MED ORDER — VITAMIN B-12 1000 MCG PO TABS
1000.0000 ug | ORAL_TABLET | Freq: Every day | ORAL | Status: DC
  Administered 2023-04-14 – 2023-04-30 (×15): 1000 ug via ORAL
  Filled 2023-04-14 (×15): qty 1

## 2023-04-14 MED ORDER — TRAMADOL HCL 50 MG PO TABS
50.0000 mg | ORAL_TABLET | Freq: Four times a day (QID) | ORAL | Status: AC | PRN
  Administered 2023-04-16: 50 mg via ORAL
  Filled 2023-04-14 (×2): qty 1

## 2023-04-14 MED ORDER — IPRATROPIUM-ALBUTEROL 0.5-2.5 (3) MG/3ML IN SOLN: 3.0000 mL | RESPIRATORY_TRACT | Status: DC | PRN

## 2023-04-14 MED ORDER — ASPIRIN 81 MG PO TBEC
81.0000 mg | DELAYED_RELEASE_TABLET | Freq: Every day | ORAL | Status: DC
  Administered 2023-04-14: 81 mg via ORAL
  Filled 2023-04-14: qty 1

## 2023-04-14 MED ORDER — CALCITRIOL 0.25 MCG PO CAPS
0.2500 ug | ORAL_CAPSULE | ORAL | Status: DC
Start: 2023-04-14 — End: 2023-05-01
  Administered 2023-04-14 – 2023-04-30 (×7): 0.25 ug via ORAL
  Filled 2023-04-14 (×9): qty 1

## 2023-04-14 MED ORDER — ADULT MULTIVITAMIN W/MINERALS CH
1.0000 | ORAL_TABLET | Freq: Every day | ORAL | Status: DC
  Administered 2023-04-14 – 2023-04-30 (×15): 1 via ORAL
  Filled 2023-04-14 (×15): qty 1

## 2023-04-14 MED ORDER — VITAMIN D 25 MCG (1000 UNIT) PO TABS
1000.0000 [IU] | ORAL_TABLET | Freq: Every day | ORAL | Status: DC
  Administered 2023-04-14 – 2023-04-30 (×15): 1000 [IU] via ORAL
  Filled 2023-04-14 (×15): qty 1

## 2023-04-14 MED ORDER — LEVOTHYROXINE SODIUM 25 MCG PO TABS
25.0000 ug | ORAL_TABLET | Freq: Every day | ORAL | Status: DC
  Administered 2023-04-14 – 2023-04-30 (×17): 25 ug via ORAL
  Filled 2023-04-14 (×17): qty 1

## 2023-04-14 MED ORDER — CITALOPRAM HYDROBROMIDE 20 MG PO TABS
10.0000 mg | ORAL_TABLET | Freq: Every day | ORAL | Status: DC
  Administered 2023-04-14 – 2023-04-30 (×15): 10 mg via ORAL
  Filled 2023-04-14 (×15): qty 1

## 2023-04-14 MED ORDER — LACTATED RINGERS IV BOLUS
500.0000 mL | Freq: Once | INTRAVENOUS | Status: AC
  Administered 2023-04-14: 500 mL via INTRAVENOUS

## 2023-04-14 NOTE — H&P (View-Only) (Signed)
 Hospital Consult    Reason for Consult:  Non-healing left foot wound Referring Physician:  Hospitalist MRN #:  [id number]  History of Present Illness: This is a 89 y.o. female with polycythemia and dementia that vascular surgery has been consulted for nonhealing left foot wound.  Patient previous underwent I&D with debridement of skin and soft tissue of the left ankle on 04/02/2023 at [facility] by Dr. Serene.  She did have ABIs on 04/05/2023 that showed severe PAD on the left with an ABI 0.46.  Daughter is at bedside and provides most of the history.  They have been doing wet-to-dry dressings to the wound.  This has been nonhealing.  She initially went to [facility] due to ongoing pain from her memory care unit and then was transferred to [facility] long as there were no beds.  Daughter states she is ambulatory with a walker.  No prior vascular inventions.  Past Medical History:  Diagnosis Date   Acute metabolic encephalopathy 10/22/2020   Confusion 12/30/2018   Polycythemia    SCC (squamous cell carcinoma) 04/11/2021   R med dorsum hand - ED&C SCCIS   SCC (squamous cell carcinoma) 10/09/2021   left lateral eye, EDC   SCC (squamous cell carcinoma) 01/28/2022   left lateral eye, recurrent, EDC. 5FU/Calcipotriene cream.   SCC (squamous cell carcinoma) 01/28/2022   in situ, left medial cheek, 5FU/Calcipotriene Cr   SCC (squamous cell carcinoma) 02/26/2022   SCC IS, Right Ulnar Dorsal Hand, 5FU/Calcipotriene cream   SCC (squamous cell carcinoma) 09/03/2022   R ankle, EDC   SCC (squamous cell carcinoma) 01/20/2023   SCC IS Left medial lower leg, EDC   Squamous cell carcinoma of skin 09/06/2020   R thumb - ED&C   Urinary tract infection without hematuria 01/02/2020   Urticaria 06/02/2017    Past Surgical History:  Procedure Laterality Date   ABDOMINAL HYSTERECTOMY     complete   BREAST SURGERY     HEMATOMA EVACUATION Left 04/02/2023   Procedure: EVACUATION ANKLE HEMATOMA;  Surgeon: Serene Gaile ORN, MD;  Location: [facility] ORS;  Service: Vascular;  Laterality: Left;    Allergies  Allergen Reactions   Bactrim  [Sulfamethoxazole -Trimethoprim ]     Due to kidney disease    Epinephrine  Other (See Comments)   Other     Per daughter RUE BP significantly higher than LUE    Prior to Admission medications   Medication Sig Start Date End Date Taking? Authorizing Provider  acetaminophen  (TYLENOL ) 325 MG tablet Take 325-650 mg by mouth every 6 (six) hours as needed for mild pain or moderate pain.   Yes [provider]  ammonium lactate  (AMLACTIN) 12 % cream Apply topically qd/bid to dry areas at lower legs and arms Patient taking differently: Apply 1 Application topically 2 (two) times daily. Apply topically qd/bid to dry areas at lower legs and arms 01/20/23  Yes Jackquline Sawyer, MD  aspirin  EC 81 MG tablet Take 81 mg by mouth daily. Swallow whole.   Yes [provider]  calcitRIOL  (ROCALTROL ) 0.25 MCG capsule Take 0.25 mcg by mouth every Monday, Wednesday, and Friday.   Yes [provider]  Cholecalciferol  (VITAMIN D3) 25 MCG (1000 UT) CAPS Take 1 capsule by mouth daily. 10/22/22  Yes [provider]  citalopram  (CELEXA ) 10 MG tablet Take 10 mg by mouth daily.   Yes [provider]  cyanocobalamin  (VITAMIN B12) 1000 MCG tablet Take 1 tablet (1,000 mcg total) by mouth daily. 04/11/23  Yes Darci Pore, MD  hydrocortisone  2.5 % cream Apply topically twice daily to aa of right cheek as needed for itch Patient taking differently: Apply 1 Application topically every 12 (twelve) hours as needed (itching of right cheek). 01/20/23  Yes Jackquline Sawyer, MD  ipratropium-albuterol  (DUONEB) 0.5-2.5 (3) MG/3ML SOLN Take 3 mLs by nebulization every 4 (four) hours as needed. Patient taking differently: Take 3 mLs by nebulization every 4 (four) hours as needed (wheezing/SOB). 04/11/23  Yes Darci Pore, MD  Iron -Vitamin C  65-125 MG TABS Take 1 tablet by mouth  daily. 12/31/22  Yes Babara Call, MD  levothyroxine  (SYNTHROID ) 25 MCG tablet Take 25 mcg by mouth daily. 03/18/23  Yes [provider]  Multiple Vitamin (MULTIVITAMIN WITH MINERALS) TABS tablet Take 1 tablet by mouth daily.   Yes [provider]  mupirocin  ointment (BACTROBAN ) 2 % Apply topically to wound at right jawline qd and cover with bandage until healed. Can use at any other crusted areas on legs and open wounds. Patient taking differently: Apply 1 Application topically daily. Apply topically to wound at right jawline qd and cover with bandage until healed. Can use at any other crusted areas on legs and open wounds. 01/20/23  Yes Stewart, Tara, MD  polyethylene glycol (MIRALAX  / GLYCOLAX ) 17 g packet Take 17 g by mouth daily as needed for mild constipation. 04/11/23  Yes Darci Pore, MD  ruxolitinib  phosphate (JAKAFI ) 5 MG tablet Take 1 tablet (5 mg total) by mouth See admin instructions. Jakafi  10 mg in the morning, 5 mg in the evening. Patient taking differently: Take 5-10 mg by mouth See admin instructions. Take 10 mg by mouth in the morning and 5 mg in the evening 02/24/23  Yes Babara Call, MD  traZODone  (DESYREL ) 50 MG tablet Take 25 mg by mouth at bedtime as needed. 11/21/22  Yes [provider]    Social History   Socioeconomic History   Marital status: Widowed    Spouse name: Not on file   Number of children: Not on file   Years of education: Not on file   Highest education level: Not on file  Occupational History   Not on file  Tobacco Use   Smoking status: Never   Smokeless tobacco: Never  Vaping Use   Vaping status: Never Used  Substance and Sexual Activity   Alcohol  use: No    Comment: occasional glass of wine   Drug use: No   Sexual activity: Not on file  Other Topics Concern   Not on file  Social History Narrative   ** Merged History Encounter **       Social Drivers of Health   Financial Resource Strain: Not on file  Food  Insecurity: No Food Insecurity (04/13/2023)   Hunger Vital Sign    Worried About Running Out of Food in the Last Year: Never true    Ran Out of Food in the Last Year: Never true  Transportation Needs: No Transportation Needs (04/13/2023)   PRAPARE - Administrator, Civil Service (Medical): No    Lack of Transportation (Non-Medical): No  Physical Activity: Not on file  Stress: Not on file  Social Connections: Unknown (04/13/2023)   Social Connection and Isolation Panel [NHANES]    Frequency of Communication with Friends and Family: More than three times a week    Frequency of Social Gatherings with Friends and Family: Once a week    Attends Religious Services: Patient unable to answer    Active Member of Clubs or  Organizations: Patient unable to answer    Attends Club or Organization Meetings: Patient unable to answer    Marital Status: Widowed  Intimate Partner Violence: Patient Unable To Answer (04/13/2023)   Humiliation, Afraid, Rape, and Kick questionnaire    Fear of Current or Ex-Partner: Patient unable to answer    Emotionally Abused: Patient unable to answer    Physically Abused: Patient unable to answer    Sexually Abused: Patient unable to answer     Family History  Problem Relation Age of Onset   Heart disease Father    Cancer Sister        breast   Cancer Sister        breast   Alzheimer's disease Sister     ROS: [x]  Positive   [ ]  Negative   [ ]  All sytems reviewed and are negative  Cardiovascular: []  chest pain/pressure []  palpitations []  SOB lying flat []  DOE []  pain in legs while walking []  pain in legs at rest []  pain in legs at night []  non-healing ulcers []  hx of DVT []  swelling in legs  Pulmonary: []  productive cough []  asthma/wheezing []  home O2  Neurologic: []  weakness in []  arms []  legs []  numbness in []  arms []  legs []  hx of CVA []  mini stroke [] difficulty speaking or slurred speech []  temporary loss of vision in one eye []   dizziness  Hematologic: []  hx of cancer []  bleeding problems []  problems with blood clotting easily  Endocrine:   []  diabetes []  thyroid  disease  GI []  vomiting blood []  blood in stool  GU: []  CKD/renal failure []  HD--[]  M/W/F or []  T/T/S []  burning with urination []  blood in urine  Psychiatric: []  anxiety []  depression  Musculoskeletal: []  arthritis []  joint pain  Integumentary: []  rashes []  ulcers  Constitutional: []  fever []  chills   Physical Examination  Vitals:   04/14/23 0545 04/14/23 0917  BP: (!) 165/70 (!) 139/56  Pulse: 91 94  Resp: 18 17  Temp: 98.4 F (36.9 C) 98 F (36.7 C)  SpO2: 93% 92%   Body mass index is 22.73 kg/m.  General:  NAD Gait: Not observed HENT: WNL, normocephalic Pulmonary: normal non-labored breathing Cardiac: regular, without  Murmurs, rubs or gallops Abdomen:  soft, NT/ND Vascular Exam/Pulses: Bilateral femoral pulses palpable No palpable pedal pulses Extremities:   Musculoskeletal: no muscle wasting or atrophy  Neurologic: A&O X 3; Appropriate Affect ; SENSATION: normal; MOTOR FUNCTION:  moving all extremities equally. Speech is fluent/normal   CBC    Component Value Date/Time   WBC 12.7 (H) 04/14/2023 0327   RBC 2.87 (L) 04/14/2023 0327   HGB 7.5 (L) 04/14/2023 0327   HGB 10.3 (L) 02/25/2023 1347   HGB 8.1 (L) 06/18/2021 1514   HCT 24.9 (L) 04/14/2023 0327   HCT 25.2 (L) 06/18/2021 1514   PLT 595 (H) 04/14/2023 0327   PLT 677 (H) 02/25/2023 1347   PLT 452 (H) 06/18/2021 1514   MCV 86.8 04/14/2023 0327   MCV 91 06/18/2021 1514   MCH 26.1 04/14/2023 0327   MCHC 30.1 04/14/2023 0327   RDW 28.9 (H) 04/14/2023 0327   RDW 20.1 (H) 06/18/2021 1514   LYMPHSABS 0.8 04/13/2023 0930   LYMPHSABS 1.0 06/18/2021 1514   MONOABS 1.1 (H) 04/13/2023 0930   EOSABS 0.7 (H) 04/13/2023 0930   EOSABS 0.4 06/18/2021 1514   BASOSABS 0.2 (H) 04/13/2023 0930   BASOSABS 0.3 (H) 06/18/2021 1514    BMET    Component  Value Date/Time   NA 137 04/14/2023 0327   NA 134 06/18/2021 1514   K 4.3 04/14/2023 0327   CL 102 04/14/2023 0327   CO2 23 04/14/2023 0327   GLUCOSE 88 04/14/2023 0327   BUN 24 (H) 04/14/2023 0327   BUN 19 06/18/2021 1514   CREATININE 1.04 (H) 04/14/2023 0327   CREATININE 1.41 (H) 12/30/2022 1118   CALCIUM  8.8 (L) 04/14/2023 0327   GFRNONAA 50 (L) 04/14/2023 0327   GFRNONAA 35 (L) 12/30/2022 1118   GFRAA 49 (L) 12/27/2019 0935    COAGS: Lab Results  Component Value Date   INR 1.0 03/31/2023   INR 1.1 10/22/2020   INR 0.92 12/03/2017     Non-Invasive Vascular Imaging:    ABIs 04/05/2023 are 0.65 on the right and 0.46 on the left   ASSESSMENT/PLAN: This is a 89 y.o. female with polycythemia and dementia that vascular surgery has been consulted for nonhealing left foot wound.  Patient previous underwent I&D with debridement of skin and soft tissue of the left ankle on 04/02/2023 at [facility] by Dr. Serene.  She did have ABIs on 04/05/2023 that showed severe PAD on the left with an ABI 0.46.  Daughter is at bedside and provides most of the history.  They have been doing wet-to-dry dressings to the wound.  This has been nonhealing.   Due to severely depressed ABI and nonpalpable pedal pulses, I have recommended transfer to [facility] for lower extremity angiogram with a focus on the left leg.  Daughter does feel that her mom would be cooperative for an angiogram and able to lay flat after transfemoral access.  Discussed n.p.o. after midnight.  Risk benefits discussed including risk of transfemoral access and groin bleed.  Consent order placed.  I have spoken with Dr. Kandis who will work on transfer to [facility].  She was apparently supposed to see plastic surgery as an outpatient but that has never been arranged.  Lonni DOROTHA Gaskins, MD [facility] Office: [phone]  Lonni JINNY Gaskins

## 2023-04-14 NOTE — Progress Notes (Signed)
 Pt was picked up by [facility]. Pts daughter called and informed of transfer.

## 2023-04-14 NOTE — Plan of Care (Signed)
  Problem: Activity: Goal: Risk for activity intolerance will decrease Outcome: Progressing   Problem: Pain Management: Goal: General experience of comfort will improve Outcome: Progressing   Problem: Safety: Goal: Ability to remain free from injury will improve Outcome: Progressing

## 2023-04-14 NOTE — Evaluation (Addendum)
 Physical Therapy Evaluation Patient Details Name: Laura Wilcox MRN: [id number] DOB: 05-17-1926 Today's Date: 04/14/2023  History of Present Illness  89 y.o. female  who was sent in from her memory care unit today because the patient has been screaming every time her foot is touched. Vascular consulted and recommended transfer to [facility] for lower extremity angiogram. PMH: advanced dementia, polycythemia vera, chronic myelofibrosis on oral chemotherapy, L ankle hematoma and laceration/fall 04/2023, s/p I&D L ankle 04/02/2023 at [facility]  Clinical Impression  Pt admitted with above diagnosis.  Pt agreeable to PT with encouragement. Assisted to standing with mod assist and lateral scooting transfer bed to chair with mod assist. Anticipate need for  continued inpatient follow up therapy, <3 hours/day at d/c. Sats 88% after bed to chair transfer on RA, RN aware (dtr reports staff taking off and replacing O2 )  Pt currently with functional limitations due to the deficits listed below (see PT Problem List). Pt will benefit from acute skilled PT to increase their independence and safety with mobility to allow discharge.           If plan is discharge home, recommend the following: A lot of help with walking and/or transfers;A lot of help with bathing/dressing/bathroom;Assistance with cooking/housework;Direct supervision/assist for medications management;Direct supervision/assist for financial management;Assist for transportation;Help with stairs or ramp for entrance;Supervision due to cognitive status   Can travel by private vehicle        Equipment Recommendations None recommended by PT  Recommendations for Other Services       Functional Status Assessment Patient has had a recent decline in their functional status and demonstrates the ability to make significant improvements in function in a reasonable and predictable amount of time.     Precautions / Restrictions Precautions Precautions:  Fall Restrictions Weight Bearing Restrictions Per Provider Order: No   no restrictions     Mobility  Bed Mobility Overal bed mobility: Needs Assistance Bed Mobility: Supine to Sit     Supine to sit: Min assist, HOB elevated, Mod assist     General bed mobility comments: incr time and effort, pt moves LEs off EOB.  bed pad utilized to assist lateral scooting and to EOB    Transfers Overall transfer level: Needs assistance Equipment used: Rolling walker (2 wheels) Transfers: Sit to/from Stand Sit to Stand: Mod assist, Max assist          Lateral/Scoot Transfers: Mod assist General transfer comment: pt able to stand with heavy mod assist to RW, multi-modal cues for hand placement, LE position. with multi-modal cues and encouragement pt participated lateral scooting transfer bed to chair    Ambulation/Gait                  Stairs            Wheelchair Mobility     Tilt Bed    Modified Rankin (Stroke Patients Only)       Balance Overall balance assessment: Needs assistance Sitting-balance support: Feet supported, Bilateral upper extremity supported, Single extremity supported Sitting balance-Leahy Scale: Fair Sitting balance - Comments: able to sit at EOB with supervision   Standing balance support: Bilateral upper extremity supported, During functional activity, Reliant on assistive device for balance Standing balance-Leahy Scale: Zero Standing balance comment: Poor static standing due to L LE pain                             Pertinent Vitals/Pain  Pain Assessment Pain Assessment: Faces Faces Pain Scale: Hurts even more Pain Location: LLE Pain Descriptors / Indicators: Discomfort, Grimacing Pain Intervention(s): Limited activity within patient's tolerance, Monitored during session, Premedicated before session    Home Living Family/patient expects to be discharged to:: Skilled nursing facility                 Home  Equipment: [employer] (2 wheels) Additional Comments: daughter lives in the area, present during session; son lives in [place]    Prior Function Prior Level of Function : Needs assist  Cognitive Assist : Mobility (cognitive) Mobility (Cognitive): Intermittent cues         Mobility Comments: Per daughter reports, patient was ambulatory using a 2-wheeled  RW ADLs Comments: per dtr pt  Supervision for bathing. IND with dressing     Extremity/Trunk Assessment   Upper Extremity Assessment Upper Extremity Assessment: Defer to OT evaluation    Lower Extremity Assessment Lower Extremity Assessment: LLE deficits/detail LLE Deficits / Details: knee and hip grossly 3/5; ankle NT d/t pain-moves actively through ~ 10 degrees       Communication   Communication Communication: Hearing impairment  Cognition Arousal: Alert Behavior During Therapy: WFL for tasks assessed/performed Overall Cognitive Status: History of cognitive impairments - at baseline                                 General Comments: hallucinating per dtr for a few days        General Comments      Exercises     Assessment/Plan    PT Assessment Patient needs continued PT services  PT Problem List Decreased strength;Decreased activity tolerance;Decreased balance;Decreased mobility;Decreased cognition       PT Treatment Interventions DME instruction;Gait training;Functional mobility training;Therapeutic activities;Therapeutic exercise;Balance training;Neuromuscular re-education;Patient/family education    PT Goals (Current goals can be found in the Care Plan section)  Acute Rehab PT Goals Patient Stated Goal: eventually to memory care PT Goal Formulation: With family Time For Goal Achievement: 04/28/23 Potential to Achieve Goals: Good    Frequency Min 1X/week     Co-evaluation               AM-PAC PT 6 Clicks Mobility  Outcome Measure Help needed turning from your back to  your side while in a flat bed without using bedrails?: A Lot Help needed moving from lying on your back to sitting on the side of a flat bed without using bedrails?: A Lot Help needed moving to and from a bed to a chair (including a wheelchair)?: A Lot Help needed standing up from a chair using your arms (e.g., wheelchair or bedside chair)?: A Lot Help needed to walk in hospital room?: Total Help needed climbing 3-5 steps with a railing? : Total 6 Click Score: 10    End of Session Equipment Utilized During Treatment: Gait belt Activity Tolerance: Patient tolerated treatment well Patient left: in chair;with call bell/phone within reach;with chair alarm set;with family/visitor present   PT Visit Diagnosis: Other abnormalities of gait and mobility (R26.89);Muscle weakness (generalized) (M62.81);Difficulty in walking, not elsewhere classified (R26.2);Unsteadiness on feet (R26.81)    Time: 8765-8693 PT Time Calculation (min) (ACUTE ONLY): 32 min   Charges:   PT Evaluation $PT Eval Low Complexity: 1 Low PT Treatments $Therapeutic Activity: 8-22 mins PT General Charges $$ ACUTE PT VISIT: 1 Visit         Rexene, PT  Acute Rehab Dept [facility]) [phone]  04/14/2023   [facility] 04/14/2023, 1:51 PM

## 2023-04-14 NOTE — Consult Note (Signed)
 Hospital Consult    Reason for Consult:  Non-healing left foot wound Referring Physician:  Hospitalist MRN #:  [id number]  History of Present Illness: This is a 89 y.o. female with polycythemia and dementia that vascular surgery has been consulted for nonhealing left foot wound.  Patient previous underwent I&D with debridement of skin and soft tissue of the left ankle on 04/02/2023 at [facility] by Dr. Serene.  She did have ABIs on 04/05/2023 that showed severe PAD on the left with an ABI 0.46.  Daughter is at bedside and provides most of the history.  They have been doing wet-to-dry dressings to the wound.  This has been nonhealing.  She initially went to [facility] due to ongoing pain from her memory care unit and then was transferred to [facility] long as there were no beds.  Daughter states she is ambulatory with a walker.  No prior vascular inventions.  Past Medical History:  Diagnosis Date   Acute metabolic encephalopathy 10/22/2020   Confusion 12/30/2018   Polycythemia    SCC (squamous cell carcinoma) 04/11/2021   R med dorsum hand - ED&C SCCIS   SCC (squamous cell carcinoma) 10/09/2021   left lateral eye, EDC   SCC (squamous cell carcinoma) 01/28/2022   left lateral eye, recurrent, EDC. 5FU/Calcipotriene cream.   SCC (squamous cell carcinoma) 01/28/2022   in situ, left medial cheek, 5FU/Calcipotriene Cr   SCC (squamous cell carcinoma) 02/26/2022   SCC IS, Right Ulnar Dorsal Hand, 5FU/Calcipotriene cream   SCC (squamous cell carcinoma) 09/03/2022   R ankle, EDC   SCC (squamous cell carcinoma) 01/20/2023   SCC IS Left medial lower leg, EDC   Squamous cell carcinoma of skin 09/06/2020   R thumb - ED&C   Urinary tract infection without hematuria 01/02/2020   Urticaria 06/02/2017    Past Surgical History:  Procedure Laterality Date   ABDOMINAL HYSTERECTOMY     complete   BREAST SURGERY     HEMATOMA EVACUATION Left 04/02/2023   Procedure: EVACUATION ANKLE HEMATOMA;  Surgeon: Serene Gaile ORN, MD;  Location: [facility] ORS;  Service: Vascular;  Laterality: Left;    Allergies  Allergen Reactions   Bactrim  [Sulfamethoxazole -Trimethoprim ]     Due to kidney disease    Epinephrine  Other (See Comments)   Other     Per daughter RUE BP significantly higher than LUE    Prior to Admission medications   Medication Sig Start Date End Date Taking? Authorizing Provider  acetaminophen  (TYLENOL ) 325 MG tablet Take 325-650 mg by mouth every 6 (six) hours as needed for mild pain or moderate pain.   Yes [provider]  ammonium lactate  (AMLACTIN) 12 % cream Apply topically qd/bid to dry areas at lower legs and arms Patient taking differently: Apply 1 Application topically 2 (two) times daily. Apply topically qd/bid to dry areas at lower legs and arms 01/20/23  Yes Jackquline Sawyer, MD  aspirin  EC 81 MG tablet Take 81 mg by mouth daily. Swallow whole.   Yes [provider]  calcitRIOL  (ROCALTROL ) 0.25 MCG capsule Take 0.25 mcg by mouth every Monday, Wednesday, and Friday.   Yes [provider]  Cholecalciferol  (VITAMIN D3) 25 MCG (1000 UT) CAPS Take 1 capsule by mouth daily. 10/22/22  Yes [provider]  citalopram  (CELEXA ) 10 MG tablet Take 10 mg by mouth daily.   Yes [provider]  cyanocobalamin  (VITAMIN B12) 1000 MCG tablet Take 1 tablet (1,000 mcg total) by mouth daily. 04/11/23  Yes Darci Pore, MD  hydrocortisone  2.5 % cream Apply topically twice daily to aa of right cheek as needed for itch Patient taking differently: Apply 1 Application topically every 12 (twelve) hours as needed (itching of right cheek). 01/20/23  Yes Jackquline Sawyer, MD  ipratropium-albuterol  (DUONEB) 0.5-2.5 (3) MG/3ML SOLN Take 3 mLs by nebulization every 4 (four) hours as needed. Patient taking differently: Take 3 mLs by nebulization every 4 (four) hours as needed (wheezing/SOB). 04/11/23  Yes Darci Pore, MD  Iron -Vitamin C  65-125 MG TABS Take 1 tablet by mouth  daily. 12/31/22  Yes Babara Call, MD  levothyroxine  (SYNTHROID ) 25 MCG tablet Take 25 mcg by mouth daily. 03/18/23  Yes [provider]  Multiple Vitamin (MULTIVITAMIN WITH MINERALS) TABS tablet Take 1 tablet by mouth daily.   Yes [provider]  mupirocin  ointment (BACTROBAN ) 2 % Apply topically to wound at right jawline qd and cover with bandage until healed. Can use at any other crusted areas on legs and open wounds. Patient taking differently: Apply 1 Application topically daily. Apply topically to wound at right jawline qd and cover with bandage until healed. Can use at any other crusted areas on legs and open wounds. 01/20/23  Yes Stewart, Tara, MD  polyethylene glycol (MIRALAX  / GLYCOLAX ) 17 g packet Take 17 g by mouth daily as needed for mild constipation. 04/11/23  Yes Darci Pore, MD  ruxolitinib  phosphate (JAKAFI ) 5 MG tablet Take 1 tablet (5 mg total) by mouth See admin instructions. Jakafi  10 mg in the morning, 5 mg in the evening. Patient taking differently: Take 5-10 mg by mouth See admin instructions. Take 10 mg by mouth in the morning and 5 mg in the evening 02/24/23  Yes Babara Call, MD  traZODone  (DESYREL ) 50 MG tablet Take 25 mg by mouth at bedtime as needed. 11/21/22  Yes [provider]    Social History   Socioeconomic History   Marital status: Widowed    Spouse name: Not on file   Number of children: Not on file   Years of education: Not on file   Highest education level: Not on file  Occupational History   Not on file  Tobacco Use   Smoking status: Never   Smokeless tobacco: Never  Vaping Use   Vaping status: Never Used  Substance and Sexual Activity   Alcohol  use: No    Comment: occasional glass of wine   Drug use: No   Sexual activity: Not on file  Other Topics Concern   Not on file  Social History Narrative   ** Merged History Encounter **       Social Drivers of Health   Financial Resource Strain: Not on file  Food  Insecurity: No Food Insecurity (04/13/2023)   Hunger Vital Sign    Worried About Running Out of Food in the Last Year: Never true    Ran Out of Food in the Last Year: Never true  Transportation Needs: No Transportation Needs (04/13/2023)   PRAPARE - Administrator, Civil Service (Medical): No    Lack of Transportation (Non-Medical): No  Physical Activity: Not on file  Stress: Not on file  Social Connections: Unknown (04/13/2023)   Social Connection and Isolation Panel [NHANES]    Frequency of Communication with Friends and Family: More than three times a week    Frequency of Social Gatherings with Friends and Family: Once a week    Attends Religious Services: Patient unable to answer    Active Member of Clubs or  Organizations: Patient unable to answer    Attends Club or Organization Meetings: Patient unable to answer    Marital Status: Widowed  Intimate Partner Violence: Patient Unable To Answer (04/13/2023)   Humiliation, Afraid, Rape, and Kick questionnaire    Fear of Current or Ex-Partner: Patient unable to answer    Emotionally Abused: Patient unable to answer    Physically Abused: Patient unable to answer    Sexually Abused: Patient unable to answer     Family History  Problem Relation Age of Onset   Heart disease Father    Cancer Sister        breast   Cancer Sister        breast   Alzheimer's disease Sister     ROS: [x]  Positive   [ ]  Negative   [ ]  All sytems reviewed and are negative  Cardiovascular: []  chest pain/pressure []  palpitations []  SOB lying flat []  DOE []  pain in legs while walking []  pain in legs at rest []  pain in legs at night []  non-healing ulcers []  hx of DVT []  swelling in legs  Pulmonary: []  productive cough []  asthma/wheezing []  home O2  Neurologic: []  weakness in []  arms []  legs []  numbness in []  arms []  legs []  hx of CVA []  mini stroke [] difficulty speaking or slurred speech []  temporary loss of vision in one eye []   dizziness  Hematologic: []  hx of cancer []  bleeding problems []  problems with blood clotting easily  Endocrine:   []  diabetes []  thyroid  disease  GI []  vomiting blood []  blood in stool  GU: []  CKD/renal failure []  HD--[]  M/W/F or []  T/T/S []  burning with urination []  blood in urine  Psychiatric: []  anxiety []  depression  Musculoskeletal: []  arthritis []  joint pain  Integumentary: []  rashes []  ulcers  Constitutional: []  fever []  chills   Physical Examination  Vitals:   04/14/23 0545 04/14/23 0917  BP: (!) 165/70 (!) 139/56  Pulse: 91 94  Resp: 18 17  Temp: 98.4 F (36.9 C) 98 F (36.7 C)  SpO2: 93% 92%   Body mass index is 22.73 kg/m.  General:  NAD Gait: Not observed HENT: WNL, normocephalic Pulmonary: normal non-labored breathing Cardiac: regular, without  Murmurs, rubs or gallops Abdomen:  soft, NT/ND Vascular Exam/Pulses: Bilateral femoral pulses palpable No palpable pedal pulses Extremities:   Musculoskeletal: no muscle wasting or atrophy  Neurologic: A&O X 3; Appropriate Affect ; SENSATION: normal; MOTOR FUNCTION:  moving all extremities equally. Speech is fluent/normal   CBC    Component Value Date/Time   WBC 12.7 (H) 04/14/2023 0327   RBC 2.87 (L) 04/14/2023 0327   HGB 7.5 (L) 04/14/2023 0327   HGB 10.3 (L) 02/25/2023 1347   HGB 8.1 (L) 06/18/2021 1514   HCT 24.9 (L) 04/14/2023 0327   HCT 25.2 (L) 06/18/2021 1514   PLT 595 (H) 04/14/2023 0327   PLT 677 (H) 02/25/2023 1347   PLT 452 (H) 06/18/2021 1514   MCV 86.8 04/14/2023 0327   MCV 91 06/18/2021 1514   MCH 26.1 04/14/2023 0327   MCHC 30.1 04/14/2023 0327   RDW 28.9 (H) 04/14/2023 0327   RDW 20.1 (H) 06/18/2021 1514   LYMPHSABS 0.8 04/13/2023 0930   LYMPHSABS 1.0 06/18/2021 1514   MONOABS 1.1 (H) 04/13/2023 0930   EOSABS 0.7 (H) 04/13/2023 0930   EOSABS 0.4 06/18/2021 1514   BASOSABS 0.2 (H) 04/13/2023 0930   BASOSABS 0.3 (H) 06/18/2021 1514    BMET    Component  Value Date/Time   NA 137 04/14/2023 0327   NA 134 06/18/2021 1514   K 4.3 04/14/2023 0327   CL 102 04/14/2023 0327   CO2 23 04/14/2023 0327   GLUCOSE 88 04/14/2023 0327   BUN 24 (H) 04/14/2023 0327   BUN 19 06/18/2021 1514   CREATININE 1.04 (H) 04/14/2023 0327   CREATININE 1.41 (H) 12/30/2022 1118   CALCIUM  8.8 (L) 04/14/2023 0327   GFRNONAA 50 (L) 04/14/2023 0327   GFRNONAA 35 (L) 12/30/2022 1118   GFRAA 49 (L) 12/27/2019 0935    COAGS: Lab Results  Component Value Date   INR 1.0 03/31/2023   INR 1.1 10/22/2020   INR 0.92 12/03/2017     Non-Invasive Vascular Imaging:    ABIs 04/05/2023 are 0.65 on the right and 0.46 on the left   ASSESSMENT/PLAN: This is a 89 y.o. female with polycythemia and dementia that vascular surgery has been consulted for nonhealing left foot wound.  Patient previous underwent I&D with debridement of skin and soft tissue of the left ankle on 04/02/2023 at [facility] by Dr. Serene.  She did have ABIs on 04/05/2023 that showed severe PAD on the left with an ABI 0.46.  Daughter is at bedside and provides most of the history.  They have been doing wet-to-dry dressings to the wound.  This has been nonhealing.   Due to severely depressed ABI and nonpalpable pedal pulses, I have recommended transfer to [facility] for lower extremity angiogram with a focus on the left leg.  Daughter does feel that her mom would be cooperative for an angiogram and able to lay flat after transfemoral access.  Discussed n.p.o. after midnight.  Risk benefits discussed including risk of transfemoral access and groin bleed.  Consent order placed.  I have spoken with Dr. Kandis who will work on transfer to [facility].  She was apparently supposed to see plastic surgery as an outpatient but that has never been arranged.  Lonni DOROTHA Gaskins, MD [facility] Office: [phone]  Lonni JINNY Gaskins

## 2023-04-14 NOTE — Progress Notes (Signed)
 [facility] called.

## 2023-04-14 NOTE — Progress Notes (Addendum)
 PROGRESS NOTE    Pernie Grosso  FMW:[id number] DOB: 10-14-1926 DOA: 04/13/2023 PCP: Merilee Setter, NP     Brief Narrative:   Laura Wilcox is a 89 y.o. female with medical history significant for advanced dementia, polycythemia vera, chronic myelofibrosis on oral chemotherapy who was sent in from her memory care unit today because the patient has been screaming every time her foot is touched.  My history comes from the patient's daughter who was at bedside. She was discharged from the hospital 2 days ago with basic wound care after a large hematoma was evacuated .  The patient had an unwitnessed fall and must have sustained trauma to that ankle.  Since discharge the staff was not able to change the bandages at all because the patient screamed so much whenever they touched her foot so after 2 days of this they sent the patient back to the emergency department.   In the emergency department her wound was evaluated and found to be infected with surrounding cellulitis. Patient had an outpatient appointment to see plastic surgery for that wound in 2 days. She will be admitted to the hospitalist service for IV antibiotics and wound care.      Assessment & Plan:   Principal Problem:   Cellulitis Active Problems:   Senile dementia with acute confusional state, without behavioral disturbance (HCC)   Polycythemia rubra vera (HCC)   Chronic kidney disease, stage 3b (HCC)   Hematoma of left ankle  # non-healing wound left lower extremity # PAD # Cellulitis Recent admit with large hematoma to his area that was evacuated, plan was outpatient f/u with plastic surgery, now admitted for worsening pain and poor healing. The foot is cool and recent ABI showed severe PAD - spoke with dr gretta of vascular, he will evaluate the patient but is pretty sure next step will be angiogram and advises transfer to [facility] - continue ceftriaxone   # Advanced dementia Resides in memory care unit  #  Myelofibrosis - hold home jakafi  given possible active infection  # Hypothyroid - home synthroid   # Normocytic anemia Hgb in 7s is stable from baseline   DVT prophylaxis: heparin  Code Status: dnr Family Communication: daughter updated @ bedside  Level of care: Med-Surg Status is: Inpatient Remains inpatient appropriate because: severity of illness    Consultants:  vascular  Procedures: None yet  Antimicrobials:  ceftriaxone     Subjective: Ongoing right leg pain  Objective: Vitals:   04/13/23 2113 04/14/23 0310 04/14/23 0545 04/14/23 0917  BP: (!) 126/52 (!) 152/55 (!) 165/70 (!) 139/56  Pulse: 86 86 91 94  Resp: 18 18 18 17   Temp: 97.6 F (36.4 C)  98.4 F (36.9 C) 98 F (36.7 C)  TempSrc: Oral  Oral   SpO2: 97% 91% 93% 92%  Weight:      Height:        Intake/Output Summary (Last 24 hours) at 04/14/2023 1508 Last data filed at 04/14/2023 0917 Gross per 24 hour  Intake 330 ml  Output 550 ml  Net -220 ml   Filed Weights   04/13/23 1826  Weight: 52.8 kg    Examination:  General exam: Appears calm and comfortable  Respiratory system: Clear to auscultation. Respiratory effort normal. Cardiovascular system: S1 & S2 heard, RRR.  Gastrointestinal system: Abdomen is nondistended, soft and nontender.   Central nervous system: Alert and oriented to self only Extremities: left foot is cool Skin:  Psychiatry: Judgement and insight appear normal. Mood & affect appropriate.  Data Reviewed: I have personally reviewed following labs and imaging studies  CBC: Recent Labs  Lab 04/10/23 0412 04/13/23 0930 04/14/23 0327  WBC 11.5* 12.4* 12.7*  NEUTROABS 7.5 9.0*  --   HGB 7.6* 9.0* 7.5*  HCT 24.5* 30.0* 24.9*  MCV 84.5 85.0 86.8  PLT 539* 744* 595*   Basic Metabolic Panel: Recent Labs  Lab 04/10/23 0412 04/13/23 0930 04/14/23 0327  NA 137 137 137  K 4.4 4.3 4.3  CL 100 98 102  CO2 27 24 23   GLUCOSE 98 146* 88  BUN 25* 28* 24*   CREATININE 1.09* 1.39* 1.04*  CALCIUM  8.5* 9.7 8.8*   GFR: Estimated Creatinine Clearance: 23.8 mL/min (A) (by C-G formula based on SCr of 1.04 mg/dL (H)). Liver Function Tests: No results for input(s): AST, ALT, ALKPHOS, BILITOT, PROT, ALBUMIN in the last 168 hours. No results for input(s): LIPASE, AMYLASE in the last 168 hours. No results for input(s): AMMONIA in the last 168 hours. Coagulation Profile: No results for input(s): INR, PROTIME in the last 168 hours. Cardiac Enzymes: No results for input(s): CKTOTAL, CKMB, CKMBINDEX, TROPONINI in the last 168 hours. BNP (last 3 results) No results for input(s): PROBNP in the last 8760 hours. HbA1C: No results for input(s): HGBA1C in the last 72 hours. CBG: No results for input(s): GLUCAP in the last 168 hours. Lipid Profile: No results for input(s): CHOL, HDL, LDLCALC, TRIG, CHOLHDL, LDLDIRECT in the last 72 hours. Thyroid  Function Tests: No results for input(s): TSH, T4TOTAL, FREET4, T3FREE, THYROIDAB in the last 72 hours. Anemia Panel: No results for input(s): VITAMINB12, FOLATE, FERRITIN, TIBC, IRON , RETICCTPCT in the last 72 hours. Urine analysis:    Component Value Date/Time   COLORURINE YELLOW (A) 04/10/2023 0600   APPEARANCEUR CLEAR (A) 04/10/2023 0600   LABSPEC 1.024 04/10/2023 0600   PHURINE 5.0 04/10/2023 0600   GLUCOSEU NEGATIVE 04/10/2023 0600   HGBUR NEGATIVE 04/10/2023 0600   BILIRUBINUR NEGATIVE 04/10/2023 0600   BILIRUBINUR neg 03/30/2021 1430   KETONESUR NEGATIVE 04/10/2023 0600   PROTEINUR NEGATIVE 04/10/2023 0600   UROBILINOGEN 0.2 03/30/2021 1430   NITRITE NEGATIVE 04/10/2023 0600   LEUKOCYTESUR NEGATIVE 04/10/2023 0600   Sepsis Labs: @LABRCNTIP (procalcitonin:4,lacticidven:4)  )No results found for this or any previous visit (from the past 240 hours).       Radiology Studies: DG Ankle Complete Left Result Date:  04/13/2023 CLINICAL DATA:  Fall yesterday.  History of left ankle hematoma. EXAM: LEFT ANKLE COMPLETE - 3+ VIEW COMPARISON:  03/31/2023 FINDINGS: Bones are diffusely demineralized. No evidence for an acute fracture. No subluxation or dislocation. No worrisome lytic or sclerotic osseous abnormality. IMPRESSION: Negative. Electronically Signed   By: Camellia Candle M.D.   On: 04/13/2023 09:49        Scheduled Meds:  acetaminophen   500 mg Oral Q6H   aspirin  EC  81 mg Oral Daily   calcitRIOL   0.25 mcg Oral Q M,W,F   cholecalciferol   1,000 Units Oral Daily   citalopram   10 mg Oral Daily   cyanocobalamin   1,000 mcg Oral Daily   feeding supplement  237 mL Oral BID BM   heparin   5,000 Units Subcutaneous Q8H   levothyroxine   25 mcg Oral Q0600   multivitamin with minerals  1 tablet Oral Daily   Continuous Infusions:  cefTRIAXone  (ROCEPHIN )  IV 1 g (04/14/23 1223)   [START ON 04/15/2023] vancomycin        LOS: 1 day     Devaughn KATHEE Ban, MD [facility]  If 7PM-7AM, please contact night-coverage [email or web address] Password [facility] 04/14/2023, 3:08 PM

## 2023-04-14 NOTE — Progress Notes (Signed)
 Report called to charge nurse for room 4E23C

## 2023-04-14 NOTE — H&P (Signed)
 History and Physical    Patient: Laura Wilcox FMW:[id number] DOB: 21-Apr-1926 DOA: 04/13/2023 DOS: the patient was seen and examined on 04/14/2023 PCP: Merilee Setter, NP  Patient [facility] from: [facility] [place] [facility]  Chief Complaint: No chief complaint on file.  HPI: [name] is a 89 y.o. female with medical history significant for advanced dementia, polycythemia vera, chronic myelofibrosis on oral chemotherapy who was sent in from her memory care unit today because the patient has been screaming every time her foot is touched.  My history comes from the patient's daughter who was at bedside. She was discharged from the hospital 2 days ago with basic wound care after a large hematoma was evacuated .  The patient had an unwitnessed fall and must have sustained trauma to that ankle.  Since discharge the staff was not able to change the bandages at all because the patient screamed so much whenever they touched her foot so after 2 days of this they sent the patient back to the emergency department.   In the emergency department her wound was evaluated and found to be infected with surrounding cellulitis. Patient had an outpatient appointment to see plastic surgery for that wound in 2 days. She will be admitted to the hospitalist service for IV antibiotics and wound care.    Review of Systems: unable to review all systems due to the inability of the patient to answer questions. Past Medical History:  Diagnosis Date   Acute metabolic encephalopathy 10/22/2020   Confusion 12/30/2018   Polycythemia    SCC (squamous cell carcinoma) 04/11/2021   R med dorsum hand - ED&C SCCIS   SCC (squamous cell carcinoma) 10/09/2021   left lateral eye, EDC   SCC (squamous cell carcinoma) 01/28/2022   left lateral eye, recurrent, EDC. 5FU/Calcipotriene cream.   SCC (squamous cell carcinoma) 01/28/2022   in situ, left medial cheek, 5FU/Calcipotriene Cr   SCC (squamous cell carcinoma)  02/26/2022   SCC IS, Right Ulnar Dorsal Hand, 5FU/Calcipotriene cream   SCC (squamous cell carcinoma) 09/03/2022   R ankle, EDC   SCC (squamous cell carcinoma) 01/20/2023   SCC IS Left medial lower leg, EDC   Squamous cell carcinoma of skin 09/06/2020   R thumb - ED&C   Urinary tract infection without hematuria 01/02/2020   Urticaria 06/02/2017   Past Surgical History:  Procedure Laterality Date   ABDOMINAL HYSTERECTOMY     complete   BREAST SURGERY     HEMATOMA EVACUATION Left 04/02/2023   Procedure: EVACUATION ANKLE HEMATOMA;  Surgeon: Serene Gaile ORN, MD;  Location: [facility] ORS;  Service: Vascular;  Laterality: Left;   Social History:  reports that she has never smoked. She has never used smokeless tobacco. She reports that she does not drink alcohol  and does not use drugs.  Allergies  Allergen Reactions   Bactrim  [Sulfamethoxazole -Trimethoprim ]     Due to kidney disease    Epinephrine  Other (See Comments)   Other     Per daughter RUE BP significantly higher than LUE    Family History  Problem Relation Age of Onset   Heart disease Father    Cancer Sister        breast   Cancer Sister        breast   Alzheimer's disease Sister     Prior to Admission medications   Medication Sig Start Date End Date Taking? Authorizing Provider  acetaminophen  (TYLENOL ) 325 MG tablet Take 325-650 mg by mouth every 6 (six) hours as needed for  mild pain or moderate pain.    [provider]  ammonium lactate  (AMLACTIN) 12 % cream Apply topically qd/bid to dry areas at lower legs and arms Patient taking differently: Apply 1 Application topically 2 (two) times daily. Apply topically qd/bid to dry areas at lower legs and arms 01/20/23   Jackquline Sawyer, MD  aspirin  EC 81 MG tablet Take 81 mg by mouth daily. Swallow whole.    [provider]  calcitRIOL  (ROCALTROL ) 0.25 MCG capsule Take 0.25 mcg by mouth every Monday, Wednesday, and Friday.    [provider]   Cholecalciferol  (VITAMIN D3) 25 MCG (1000 UT) CAPS Take 1 capsule by mouth daily. 10/22/22   [provider]  citalopram  (CELEXA ) 20 MG tablet Take 1 tablet (20 mg total) by mouth daily. Patient taking differently: Take 10 mg by mouth daily. 09/27/22   Borders, Fonda SAUNDERS, NP  cyanocobalamin  (VITAMIN B12) 1000 MCG tablet Take 1 tablet (1,000 mcg total) by mouth daily. 04/11/23   Darci Pore, MD  feeding supplement (ENSURE ENLIVE / ENSURE PLUS) LIQD Take 237 mLs by mouth 2 (two) times daily between meals. 07/02/21   Amin, Sumayya, MD  hydrocortisone  2.5 % cream Apply topically twice daily to aa of right cheek as needed for itch 01/20/23   Jackquline Sawyer, MD  ipratropium-albuterol  (DUONEB) 0.5-2.5 (3) MG/3ML SOLN Take 3 mLs by nebulization every 4 (four) hours as needed. 04/11/23   Darci Pore, MD  Iron -Vitamin C  65-125 MG TABS Take 1 tablet by mouth daily. 12/31/22   Babara Call, MD  levothyroxine  (SYNTHROID ) 25 MCG tablet Take 25 mcg by mouth daily. 03/18/23   [provider]  Multiple Vitamin (MULTIVITAMIN WITH MINERALS) TABS tablet Take 1 tablet by mouth daily.    [provider]  mupirocin  ointment (BACTROBAN ) 2 % Apply topically to wound at right jawline qd and cover with bandage until healed. Can use at any other crusted areas on legs and open wounds. Patient not taking: Reported on 03/31/2023 01/20/23   Stewart, Tara, MD  polyethylene glycol (MIRALAX  / GLYCOLAX ) 17 g packet Take 17 g by mouth daily as needed for mild constipation. 04/11/23   Darci Pore, MD  ruxolitinib  phosphate (JAKAFI ) 5 MG tablet Take 1 tablet (5 mg total) by mouth See admin instructions. Jakafi  10 mg in the morning, 5 mg in the evening. 02/24/23   Babara Call, MD    Physical Exam: Vitals:   04/13/23 1826 04/13/23 2113  BP: (!) 160/84 (!) 126/52  Pulse: 81 86  Resp: 19 18  Temp: 98.7 F (37.1 C) 97.6 F (36.4 C)  TempSrc: Oral Oral  SpO2: 95% 97%  Weight: 52.8 kg    Height: 5' (1.524 m)    Physical Exam:  General: No acute distress, well developed, well nourished HEENT: Normocephalic, atraumatic, PERRL Cardiovascular: Normal rate and rhythm. Faint DP pulse on the right.  Patient would not  Let me palpate the left foot due to pain Pulmonary: Normal pulmonary effort, normal breath sounds Gastrointestinal: Nondistended abdomen, soft, non-tender, normoactive bowel sounds Musculoskeletal:Normal ROM, no lower ext edema, reddness on top of left foot extending beyond her bandage. Skin: Skin is warm and dry. Neuro: No focal deficits noted, pleasantly disorieted PSYCH: cooperative  Data Reviewed:  Results for orders placed or performed during the hospital encounter of 04/13/23 (from the past 24 hours)  CBC with Differential     Status: Abnormal   Collection Time: 04/13/23  9:30 AM  Result Value Ref Range   WBC  12.4 (H) 4.0 - 10.5 K/uL   RBC 3.53 (L) 3.87 - 5.11 MIL/uL   Hemoglobin 9.0 (L) 12.0 - 15.0 g/dL   HCT 69.9 (L) 63.9 - 53.9 %   MCV 85.0 80.0 - 100.0 fL   MCH 25.5 (L) 26.0 - 34.0 pg   MCHC 30.0 30.0 - 36.0 g/dL   RDW 70.9 (H) 88.4 - 84.4 %   Platelets 744 (H) 150 - 400 K/uL   nRBC 0.6 (H) 0.0 - 0.2 %   Neutrophils Relative % 73 %   Neutro Abs 9.0 (H) 1.7 - 7.7 K/uL   Lymphocytes Relative 6 %   Lymphs Abs 0.8 0.7 - 4.0 K/uL   Monocytes Relative 9 %   Monocytes Absolute 1.1 (H) 0.1 - 1.0 K/uL   Eosinophils Relative 5 %   Eosinophils Absolute 0.7 (H) 0.0 - 0.5 K/uL   Basophils Relative 1 %   Basophils Absolute 0.2 (H) 0.0 - 0.1 K/uL   Immature Granulocytes 6 %   Abs Immature Granulocytes 0.74 (H) 0.00 - 0.07 K/uL  Basic metabolic panel     Status: Abnormal   Collection Time: 04/13/23  9:30 AM  Result Value Ref Range   Sodium 137 135 - 145 mmol/L   Potassium 4.3 3.5 - 5.1 mmol/L   Chloride 98 98 - 111 mmol/L   CO2 24 22 - 32 mmol/L   Glucose, Bld 146 (H) 70 - 99 mg/dL   BUN 28 (H) 8 - 23 mg/dL   Creatinine, Ser 8.60 (H) 0.44 - 1.00  mg/dL   Calcium  9.7 8.9 - 10.3 mg/dL   GFR, Estimated 35 (L) >60 mL/min   Anion gap 15 5 - 15   *Note: Due to a large number of results and/or encounters for the requested time period, some results have not been displayed. A complete set of results can be found in Results Review.     Assessment and Plan: Cellulitis and infected wound  - Vanc and Rocephin   - wet to dry dressings until other recommendations are made - Plastic surgery consult for further recommendations as recommended by wound care - pain control.  The patient's daughter says tramadol  works beautifully for her.  Dementia - continue routine medications  - Monitor for delirium - The patient is ambulatory and should be walk every day to maintain function. She is not on any movement limitations with the ankle wound.   Advance Care Planning:   Code Status: Limited: Do not attempt resuscitation (DNR) -DNR-LIMITED -Do Not Intubate/DNI the patient's daughter is the healthcare power of attorney and she confirmed the DNR orders.  Consults: none  Family Communication: The patient's daughter was at the bedside  Severity of Illness: The appropriate patient status for this patient is INPATIENT. Inpatient status is judged to be reasonable and necessary in order to provide the required intensity of service to ensure the patient's safety. The patient's presenting symptoms, physical exam findings, and initial radiographic and laboratory data in the context of their chronic comorbidities is felt to place them at high risk for further clinical deterioration. Furthermore, it is not anticipated that the patient will be medically stable for discharge from the hospital within 2 midnights of admission.   * I certify that at the point of admission it is my clinical judgment that the patient will require inpatient hospital care spanning beyond 2 midnights from the point of admission due to high intensity of service, high risk for further  deterioration and high frequency of surveillance  required.*  Author: ARTHEA CHILD, MD 04/14/2023 1:19 AM  For on call review [email or web address].

## 2023-04-14 NOTE — Progress Notes (Signed)
   04/14/23 2051  Vitals  Temp 98.2 F (36.8 C)  Temp Source Oral  BP 93/64  MAP (mmHg) 74  BP Location Left Arm  BP Method Automatic  Patient Position (if appropriate) Lying  Pulse Rate 100  Pulse Rate Source Monitor  ECG Heart Rate 98  Resp 19  Level of Consciousness  Level of Consciousness Alert  MEWS COLOR  MEWS Score Color Green  Oxygen Therapy  SpO2 94 %  O2 Device Room Air  MEWS Score  MEWS Temp 0  MEWS Systolic 1  MEWS Pulse 0  MEWS RR 0  MEWS LOC 0  MEWS Score 1   Pt transferred from [place] [facility] to [id number]. CHG bath given. Pt made comfortable. Call light by pt. MD notified.

## 2023-04-15 ENCOUNTER — Encounter (HOSPITAL_COMMUNITY)
Admission: EM | Disposition: A | Discharge status: Skilled Nursing Facility | Payer: Self-pay | Source: Ambulatory Visit | Attending: Internal Medicine

## 2023-04-15 ENCOUNTER — Institutional Professional Consult (permissible substitution): Payer: PRIVATE HEALTH INSURANCE | Admitting: Plastic Surgery

## 2023-04-15 DIAGNOSIS — I70243 Atherosclerosis of native arteries of left leg with ulceration of ankle: Secondary | ICD-10-CM | POA: Diagnosis not present

## 2023-04-15 DIAGNOSIS — L03116 Cellulitis of left lower limb: Secondary | ICD-10-CM | POA: Diagnosis not present

## 2023-04-15 DIAGNOSIS — I70222 Atherosclerosis of native arteries of extremities with rest pain, left leg: Secondary | ICD-10-CM | POA: Diagnosis not present

## 2023-04-15 HISTORY — PX: PERIPHERAL VASCULAR BALLOON ANGIOPLASTY: CATH118281

## 2023-04-15 HISTORY — PX: ABDOMINAL AORTOGRAM W/LOWER EXTREMITY: CATH118223

## 2023-04-15 HISTORY — PX: PERIPHERAL VASCULAR INTERVENTION: CATH118257

## 2023-04-15 LAB — CBC
HCT: 25.1 % — ABNORMAL LOW (ref 36.0–46.0)
HCT: 24.6 % — ABNORMAL LOW (ref 36.0–46.0)
Hemoglobin: 7.6 g/dL — ABNORMAL LOW (ref 12.0–15.0)
Hemoglobin: 7.5 g/dL — ABNORMAL LOW (ref 12.0–15.0)
MCH: 25.8 pg — ABNORMAL LOW (ref 26.0–34.0)
MCH: 25.9 pg — ABNORMAL LOW (ref 26.0–34.0)
MCHC: 30.3 g/dL (ref 30.0–36.0)
MCHC: 30.5 g/dL (ref 30.0–36.0)
MCV: 85.1 fL (ref 80.0–100.0)
MCV: 84.8 fL (ref 80.0–100.0)
Platelets: 635 10*3/uL — ABNORMAL HIGH (ref 150–400)
Platelets: 670 10*3/uL — ABNORMAL HIGH (ref 150–400)
RBC: 2.95 MIL/uL — ABNORMAL LOW (ref 3.87–5.11)
RBC: 2.9 MIL/uL — ABNORMAL LOW (ref 3.87–5.11)
RDW: 28.8 % — ABNORMAL HIGH (ref 11.5–15.5)
RDW: 28.4 % — ABNORMAL HIGH (ref 11.5–15.5)
WBC: 13.4 10*3/uL — ABNORMAL HIGH (ref 4.0–10.5)
WBC: 12.4 10*3/uL — ABNORMAL HIGH (ref 4.0–10.5)
nRBC: 0.4 % — ABNORMAL HIGH (ref 0.0–0.2)
nRBC: 0.6 % — ABNORMAL HIGH (ref 0.0–0.2)

## 2023-04-15 LAB — BASIC METABOLIC PANEL
Anion gap: 10 (ref 5–15)
BUN: 19 mg/dL (ref 8–23)
CO2: 26 mmol/L (ref 22–32)
Calcium: 9.1 mg/dL (ref 8.9–10.3)
Chloride: 101 mmol/L (ref 98–111)
Creatinine, Ser: 1.1 mg/dL — ABNORMAL HIGH (ref 0.44–1.00)
GFR, Estimated: 47 mL/min — ABNORMAL LOW (ref >60)
Glucose, Bld: 92 mg/dL (ref 70–99)
Potassium: 4.2 mmol/L (ref 3.5–5.1)
Sodium: 137 mmol/L (ref 135–145)

## 2023-04-15 LAB — GLUCOSE, CAPILLARY: Glucose-Capillary: 74 mg/dL (ref 70–99)

## 2023-04-15 SURGERY — ABDOMINAL AORTOGRAM W/LOWER EXTREMITY
Anesthesia: LOCAL

## 2023-04-15 MED ORDER — MIDAZOLAM HCL 2 MG/2ML IJ SOLN
INTRAMUSCULAR | Status: AC
  Filled 2023-04-15: qty 2

## 2023-04-15 MED ORDER — ASPIRIN 81 MG PO TBEC
81.0000 mg | DELAYED_RELEASE_TABLET | Freq: Every day | ORAL | Status: DC
  Administered 2023-04-16 – 2023-04-26 (×11): 81 mg via ORAL
  Filled 2023-04-15 (×11): qty 1

## 2023-04-15 MED ORDER — HYDRALAZINE HCL 20 MG/ML IJ SOLN: 5.0000 mg | INTRAMUSCULAR | Status: DC | PRN

## 2023-04-15 MED ORDER — SODIUM CHLORIDE 0.9% FLUSH: 3.0000 mL | INTRAVENOUS | Status: DC | PRN

## 2023-04-15 MED ORDER — FENTANYL CITRATE (PF) 100 MCG/2ML IJ SOLN
INTRAMUSCULAR | Status: AC
  Filled 2023-04-15: qty 2

## 2023-04-15 MED ORDER — FENTANYL CITRATE (PF) 100 MCG/2ML IJ SOLN
INTRAMUSCULAR | Status: DC | PRN
  Administered 2023-04-15: 25 ug via INTRAVENOUS

## 2023-04-15 MED ORDER — HEPARIN SODIUM (PORCINE) 1000 UNIT/ML IJ SOLN
INTRAMUSCULAR | Status: DC | PRN
  Administered 2023-04-15: 5000 [IU] via INTRAVENOUS

## 2023-04-15 MED ORDER — MELATONIN 3 MG PO TABS
3.0000 mg | ORAL_TABLET | Freq: Every day | ORAL | Status: DC
  Administered 2023-04-15 – 2023-04-17 (×3): 3 mg via ORAL
  Filled 2023-04-15 (×3): qty 1

## 2023-04-15 MED ORDER — SODIUM CHLORIDE 0.9% FLUSH
3.0000 mL | Freq: Two times a day (BID) | INTRAVENOUS | Status: DC
  Administered 2023-04-16 – 2023-04-30 (×22): 3 mL via INTRAVENOUS

## 2023-04-15 MED ORDER — HEPARIN (PORCINE) IN NACL 1000-0.9 UT/500ML-% IV SOLN
INTRAVENOUS | Status: DC | PRN
  Administered 2023-04-15 (×2): 500 mL

## 2023-04-15 MED ORDER — LIDOCAINE HCL (PF) 1 % IJ SOLN
INTRAMUSCULAR | Status: DC | PRN
  Administered 2023-04-15: 15 mL

## 2023-04-15 MED ORDER — LIDOCAINE HCL (PF) 1 % IJ SOLN
INTRAMUSCULAR | Status: AC
  Filled 2023-04-15: qty 30

## 2023-04-15 MED ORDER — SODIUM CHLORIDE 0.9 % WEIGHT BASED INFUSION
1.0000 mL/kg/h | INTRAVENOUS | Status: DC
  Administered 2023-04-15: 1 mL/kg/h via INTRAVENOUS

## 2023-04-15 MED ORDER — LABETALOL HCL 5 MG/ML IV SOLN: 10.0000 mg | INTRAVENOUS | Status: DC | PRN

## 2023-04-15 MED ORDER — IODIXANOL 320 MG/ML IV SOLN
INTRAVENOUS | Status: DC | PRN
  Administered 2023-04-15: 75 mL

## 2023-04-15 MED ORDER — ACETAMINOPHEN 325 MG PO TABS: 650.0000 mg | ORAL_TABLET | ORAL | Status: DC | PRN

## 2023-04-15 MED ORDER — HEPARIN SODIUM (PORCINE) 1000 UNIT/ML IJ SOLN
INTRAMUSCULAR | Status: AC
  Filled 2023-04-15: qty 10

## 2023-04-15 MED ORDER — SODIUM CHLORIDE 0.9 % IV SOLN
250.0000 mL | INTRAVENOUS | Status: AC | PRN
Start: 2023-04-16 — End: 2023-04-17

## 2023-04-15 MED ORDER — MIDAZOLAM HCL 2 MG/2ML IJ SOLN
INTRAMUSCULAR | Status: DC | PRN
  Administered 2023-04-15: .5 mg via INTRAVENOUS

## 2023-04-15 SURGICAL SUPPLY — 28 items
BALLN MUSTANG 4X150X135 (BALLOONS) ×2 IMPLANT
BALLN MUSTANG 5X150X135 (BALLOONS) ×2 IMPLANT
BALLOON MUSTANG 4X150X135 (BALLOONS) IMPLANT
BALLOON MUSTANG 5X150X135 (BALLOONS) IMPLANT
CATH ANGIO 5F BER2 65CM (CATHETERS) IMPLANT
CATH OMNI FLUSH 5F 65CM (CATHETERS) IMPLANT
CATH QUICKCROSS SUPP .035X90CM (MICROCATHETER) IMPLANT
COVER DOME SNAP 22 D (MISCELLANEOUS) IMPLANT
DEVICE TORQUE SEADRAGON GRN (MISCELLANEOUS) IMPLANT
DEVICE VASC CLSR CELT ART 6 (Vascular Products) IMPLANT
GUIDEWIRE ANGLED .035X150CM (WIRE) IMPLANT
KIT ENCORE 26 ADVANTAGE (KITS) IMPLANT
KIT MICROPUNCTURE NIT STIFF (SHEATH) IMPLANT
KIT PV (KITS) ×2 IMPLANT
PROTECTION STATION PRESSURIZED (MISCELLANEOUS) ×2 IMPLANT
SET ATX-X65L (MISCELLANEOUS) IMPLANT
SHEATH CATAPULT 6FR 45 (SHEATH) IMPLANT
SHEATH PINNACLE 5F 10CM (SHEATH) IMPLANT
SHEATH PINNACLE 6F 10CM (SHEATH) IMPLANT
SHEATH PROBE COVER 6X72 (BAG) IMPLANT
STATION PROTECTION PRESSURIZED (MISCELLANEOUS) IMPLANT
STENT ELUVIA 6X150X130 (Permanent Stent) IMPLANT
STENT ELUVIA 6X40X130 (Permanent Stent) IMPLANT
SYR MEDRAD MARK 7 150ML (SYRINGE) ×2 IMPLANT
TRANSDUCER W/STOPCOCK (MISCELLANEOUS) ×2 IMPLANT
TRAY PV CATH (CUSTOM PROCEDURE TRAY) ×2 IMPLANT
WIRE BENTSON .035X145CM (WIRE) IMPLANT
WIRE HI TORQ VERSACORE 300 (WIRE) IMPLANT

## 2023-04-15 NOTE — Interval H&P Note (Signed)
 History and Physical Interval Note:  04/15/2023 2:21 PM  Laura Wilcox  has presented today for surgery, with the diagnosis of ischemia.  The various methods of treatment have been discussed with the patient and family. After consideration of risks, benefits and other options for treatment, the patient has consented to  Procedure(s): ABDOMINAL AORTOGRAM W/LOWER EXTREMITY (N/A) as a surgical intervention.  The patient's history has been reviewed, patient examined, no change in status, stable for surgery.  I have reviewed the patient's chart and labs.  Questions were answered to the patient's satisfaction.     Malvina New

## 2023-04-15 NOTE — Op Note (Addendum)
 Patient name: Laura Wilcox MRN: [id number] DOB: 07/10/1926 Sex: female  04/15/2023 Pre-operative Diagnosis: Left leg ulcer Post-operative diagnosis:  Same Surgeon:  Malvina New Procedure Performed:  1.  Ultrasound-guided access, right femoral artery  2.  Abdominal aortogram  3.  Left lower extremity angiogram  4.  Selective imaging with cath in the left popliteal artery  5.  Stent, left superficial femoral-popliteal artery  6.  Conscious sedation, 54 minutes  7.  Closure device, Celt   Indications: This is a 89 year old female who suffered a fall about 2 weeks ago with a significant left ankle hematoma requiring surgical evacuation creating a large wound.  She has had deterioration of the wound.  ABIs were suboptimal for healing and so she comes in today for angiography.  Procedure:  The patient was identified in the holding area and taken to room 8.  The patient was then placed supine on the table and prepped and draped in the usual sterile fashion.  A time out was called.  Conscious sedation was administered with the use of IV fentanyl  and Versed  under continuous physician and nurse monitoring.  Heart rate, blood pressure, and oxygen saturation were continuously monitored.  Total sedation time was 54 minutes.  Ultrasound was used to evaluate the right common femoral artery.  It was patent .  A digital ultrasound image was acquired.  A micropuncture needle was used to access the right common femoral artery under ultrasound guidance.  An 018 wire was advanced without resistance and a micropuncture sheath was placed.  The 018 wire was removed and a benson wire was placed.  The micropuncture sheath was exchanged for a 5 french sheath.  An omniflush catheter was advanced over the wire to the level of L-1.  An abdominal angiogram was obtained.  Next, using the omniflush catheter and a benson wire, the aortic bifurcation was crossed and the catheter was placed into theleft external iliac artery  and left runoff was obtained.    Findings:   Aortogram: No significant renal stenosis identified.  The infrarenal abdominal aorta is calcified but patent without significant stenosis.  Greater than 50% right common iliac stenosis.  The left common and external iliac and the right extrailiac artery are widely patent.  Left Lower Extremity: The right common femoral and profundofemoral artery are patent throughout the course.  The superficial femoral artery is occluded just beyond its origin.  There is a long segment occlusion with reconstitution of the above-knee popliteal artery and single-vessel runoff via the peroneal artery.  Right Lower Extremity: Not evaluated  Intervention: After the above images were acquired the decision was made to proceed with intervention.  A 6 x 45 sheath was inserted into the left external iliac artery.  The patient was fully heparinized.  Next, using a 035 Glidewire and a quick cross catheter, subintimal recanalization was performed.  A contrast injection was performed with the catheter in the above-knee popliteal artery, confirming successful crossing of the lesion.  Next, a 035 versa core wire was placed.  The subintimal track was dilated with a 4 mm balloon and then overlapping 6 mm Eluvia stents were deployed to treat the entire length of the lesion (6 x 150 x 2, 6 x 40).  The stents were dilated with a 5 mm balloon.  There was a residual waist up near the origin of the stents which was treated again with balloon angioplasty.  Completion imaging showed residual in-stent narrowing in the proximal portion of the stents.  The remaining portion of the stents are widely patent with preserved single-vessel runoff via the peroneal artery.  The long sheath was exchanged out for a short 6 French sheath and a Celt was sutured closure without complication  Impression:  #1  Occluded Left superficial femoral artery, successfully crossed and treated with primary stenting using 6 mm  drug-eluting Eluvia stents.  #2  Single-vessel runoff via the peroneal artery  #3  Will only treat with 81 mg of aspirin  and not Plavix given her high risk of bleeding and frailty.   ALONSO Malvina New, M.D., [facility] [place] Office: [phone] Pager:  [phone]

## 2023-04-15 NOTE — Progress Notes (Signed)
  Progress Note    04/15/2023 6:46 AM Hospital Day 2  Subjective:  sleeping.  No family in room  afebrile  Vitals:   04/14/23 2332 04/15/23 0439  BP: 105/77 (!) 123/113  Pulse: 80 86  Resp: 19 18  Temp: 97.7 F (36.5 C) 97.8 F (36.6 C)  SpO2: 99% 99%    Physical Exam: General:  no distress Lungs:  non labored   CBC    Component Value Date/Time   WBC 12.7 (H) 04/14/2023 0327   RBC 2.87 (L) 04/14/2023 0327   HGB 7.5 (L) 04/14/2023 0327   HGB 10.3 (L) 02/25/2023 1347   HGB 8.1 (L) 06/18/2021 1514   HCT 24.9 (L) 04/14/2023 0327   HCT 25.2 (L) 06/18/2021 1514   PLT 595 (H) 04/14/2023 0327   PLT 677 (H) 02/25/2023 1347   PLT 452 (H) 06/18/2021 1514   MCV 86.8 04/14/2023 0327   MCV 91 06/18/2021 1514   MCH 26.1 04/14/2023 0327   MCHC 30.1 04/14/2023 0327   RDW 28.9 (H) 04/14/2023 0327   RDW 20.1 (H) 06/18/2021 1514   LYMPHSABS 0.8 04/13/2023 0930   LYMPHSABS 1.0 06/18/2021 1514   MONOABS 1.1 (H) 04/13/2023 0930   EOSABS 0.7 (H) 04/13/2023 0930   EOSABS 0.4 06/18/2021 1514   BASOSABS 0.2 (H) 04/13/2023 0930   BASOSABS 0.3 (H) 06/18/2021 1514    BMET    Component Value Date/Time   NA 137 04/14/2023 0327   NA 134 06/18/2021 1514   K 4.3 04/14/2023 0327   CL 102 04/14/2023 0327   CO2 23 04/14/2023 0327   GLUCOSE 88 04/14/2023 0327   BUN 24 (H) 04/14/2023 0327   BUN 19 06/18/2021 1514   CREATININE 1.04 (H) 04/14/2023 0327   CREATININE 1.41 (H) 12/30/2022 1118   CALCIUM  8.8 (L) 04/14/2023 0327   GFRNONAA 50 (L) 04/14/2023 0327   GFRNONAA 35 (L) 12/30/2022 1118   GFRAA 49 (L) 12/27/2019 0935    INR    Component Value Date/Time   INR 1.0 03/31/2023 0933     Intake/Output Summary (Last 24 hours) at 04/15/2023 0646 Last data filed at 04/14/2023 1400 Gross per 24 hour  Intake 420 ml  Output 200 ml  Net 220 ml     Assessment/Plan:  89 y.o. female with non healing left foot wound  Hospital Day 2  -sleeping and I did not wake her and no family  present this morning.   -for angiogram this morning with Dr. Serene -continue npo   Garrie Elenes, PA-C [facility] [phone] 04/15/2023 6:46 AM

## 2023-04-15 NOTE — NC FL2 (Signed)
 [place]  MEDICAID FL2 LEVEL OF CARE FORM     IDENTIFICATION  Patient Name: Laura Wilcox Birthdate: 01-May-1926 Sex: female Admission Date (Current Location): 04/13/2023  [facility] and [place] Number:  Chiropodist and Address:  [facility] [place], [address], [place], [place] [address]      Provider Number: [id number]  Attending Physician Name and Address:  [place], Camellia PARAS, DO  Relative Name and Phone Number:       Current Level of Care: Hospital Recommended Level of Care: Skilled Nursing Facility Prior Approval Number:    Date Approved/Denied:   PASRR Number: [id number]  Discharge Plan: SNF    Current Diagnoses: Patient Active Problem List   Diagnosis Date Noted   Cellulitis 04/13/2023   Hematoma of left ankle 03/31/2023   Hematoma 03/31/2023   SCC (squamous cell carcinoma) 03/19/2022   Skin tear of lower leg without complication, right, initial encounter 09/24/2021   Sepsis (HCC) 09/24/2021   Leg wound, right 09/24/2021   Encounter for antineoplastic chemotherapy 09/21/2021   Senile dementia with acute confusional state, without behavioral disturbance (HCC) 06/26/2021   Depression 06/26/2021   Weakness 06/26/2021   UTI (urinary tract infection) 06/25/2021   Myelofibrosis (HCC) 01/31/2020   Other pancytopenia (HCC) 01/31/2020   Moderate protein-calorie malnutrition (HCC)    Squamous cell cancer of skin of left cheek 01/16/2020   Chronic kidney disease, stage 3b (HCC) 03/11/2019   Secondary hyperparathyroidism of renal origin (HCC) 03/11/2019   Anemia in chronic kidney disease 03/11/2019   Elevated ferritin 02/18/2018   Goals of care, counseling/discussion 07/09/2017   Polycythemia rubra vera (HCC) 06/02/2017   Protein-calorie malnutrition, moderate (HCC) 06/02/2017   Advanced care planning/counseling discussion 06/02/2017   Age-related cognitive decline 06/02/2017    Orientation RESPIRATION BLADDER Height & Weight         Normal External catheter, Incontinent Weight: 116 lb 6.4 oz (52.8 kg) Height:  5' (152.4 cm)  BEHAVIORAL SYMPTOMS/MOOD NEUROLOGICAL BOWEL NUTRITION STATUS      Incontinent Diet (please see discharge summary)  AMBULATORY STATUS COMMUNICATION OF NEEDS Skin   Limited Assist Verbally Surgical wounds (wound incision laceration, pretibial Right)                       Personal Care Assistance Level of Assistance  Bathing, Feeding, Dressing Bathing Assistance: Limited assistance Feeding assistance: Limited assistance Dressing Assistance: Limited assistance     Functional Limitations Info  Sight, Hearing, Speech Sight Info: Adequate (glasses) Hearing Info: Impaired Speech Info: Adequate    SPECIAL CARE FACTORS FREQUENCY  PT (By licensed PT), OT (By licensed OT)     PT Frequency: 5x per week OT Frequency: 5x per week            Contractures Contractures Info: Not present    Additional Factors Info  Code Status, Allergies Code Status Info: DNR limited Allergies Info: Bactrim ,Epinephrine            Current Medications (04/15/2023):  This is the current hospital active medication list Current Facility-Administered Medications  Medication Dose Route Frequency Provider Last Rate Last Admin   [MAR Hold] acetaminophen  (TYLENOL ) tablet 650 mg  650 mg Oral Q6H PRN Arthea Child, MD       Or   ILDA Hold] acetaminophen  (TYLENOL ) suppository 650 mg  650 mg Rectal Q6H PRN Claiborne, Claudia, MD       [MAR Hold] acetaminophen  (TYLENOL ) tablet 500 mg  500 mg Oral Q6H Arthea Child, MD  500 mg at 04/15/23 0512   [MAR Hold] aspirin  EC tablet 81 mg  81 mg Oral Daily Claiborne, Claudia, MD   81 mg at 04/14/23 0815   [MAR Hold] calcitRIOL  (ROCALTROL ) capsule 0.25 mcg  0.25 mcg Oral Q M,W,F Claiborne, Claudia, MD   0.25 mcg at 04/14/23 0816   [MAR Hold] cefTRIAXone  (ROCEPHIN ) 1 g in sodium chloride  0.9 % 100 mL IVPB  1 g Intravenous Q24H Arthea Child, MD 200 mL/hr at 04/15/23  1331 1 g at 04/15/23 1331   [MAR Hold] cholecalciferol  (VITAMIN D3) 25 MCG (1000 UNIT) tablet 1,000 Units  1,000 Units Oral Daily Claiborne, Claudia, MD   1,000 Units at 04/14/23 0816   [MAR Hold] citalopram  (CELEXA ) tablet 10 mg  10 mg Oral Daily Claiborne, Claudia, MD   10 mg at 04/14/23 0816   [MAR Hold] cyanocobalamin  (VITAMIN B12) tablet 1,000 mcg  1,000 mcg Oral Daily Claiborne, Claudia, MD   1,000 mcg at 04/14/23 0816   [MAR Hold] feeding supplement (ENSURE ENLIVE / ENSURE PLUS) liquid 237 mL  237 mL Oral BID BM Arthea Child, MD   237 mL at 04/14/23 1352   fentaNYL  (SUBLIMAZE ) injection    PRN Serene Gaile ORN, MD   25 mcg at 04/15/23 1422   [MAR Hold] heparin  injection 5,000 Units  5,000 Units Subcutaneous Q8H Arthea Child, MD   5,000 Units at 04/15/23 1333   heparin  sodium (porcine) injection    PRN Serene Gaile ORN, MD   5,000 Units at 04/15/23 1447   [MAR Hold] ipratropium-albuterol  (DUONEB) 0.5-2.5 (3) MG/3ML nebulizer solution 3 mL  3 mL Nebulization Q4H PRN Arthea Child, MD       [employer] Hold] levothyroxine  (SYNTHROID ) tablet 25 mcg  25 mcg Oral Q0600 Claiborne, Claudia, MD   25 mcg at 04/15/23 9487   lidocaine  (PF) (XYLOCAINE ) 1 % injection    PRN Serene Gaile ORN, MD   15 mL at 04/15/23 1427   melatonin tablet 3 mg  3 mg Oral QHS [place], Camellia PARAS, DO       midazolam  (VERSED ) injection    PRN Serene Gaile ORN, MD   0.5 mg at 04/15/23 1422   [MAR Hold] multivitamin with minerals tablet 1 tablet  1 tablet Oral Daily Claiborne, Claudia, MD   1 tablet at 04/14/23 0816   [MAR Hold] ondansetron  (ZOFRAN ) tablet 4 mg  4 mg Oral Q6H PRN Arthea Child, MD       Or   ILDA Hold] ondansetron  (ZOFRAN ) injection 4 mg  4 mg Intravenous Q6H PRN Arthea Child, MD       [MAR Hold] senna-docusate (Senokot-S) tablet 1 tablet  1 tablet Oral QHS PRN Claiborne, Claudia, MD       [MAR Hold] traMADol  (ULTRAM ) tablet 50 mg  50 mg Oral Q6H PRN Claiborne, Claudia, MD         Discharge  Medications: Please see discharge summary for a list of discharge medications.  Relevant Imaging Results:  Relevant Lab Results:   Additional Information SSN [id number]  Laura LOISE Louder, LCSW

## 2023-04-15 NOTE — Progress Notes (Signed)
 OT Cancellation Note  Patient Details Name: Tahlia Deamer MRN: [id number] DOB: [birth year]   Cancelled Treatment:    Reason Eval/Treat Not Completed: Patient at procedure or test/ unavailable Pt off unit for angiogram. Will follow up for OT eval likely tomorrow.   Zandra Lajeunesse  Britt 04/15/2023, 2:14 PM

## 2023-04-15 NOTE — TOC Initial Note (Signed)
 Transition of Care [facility]) - Initial/Assessment Note    Patient Details  Name: Laura Wilcox MRN: [id number] Date of Birth: 1927-02-11  [facility] CM/SW Contact:    [name], LCSW Phone Number: 04/15/2023, 2:06 PM  Clinical Narrative:                  11:25 am CSW visit to visit with the patient. Patient was resting comfortably, CSW did not want to disturb.   11:30 am Patient's daughter, Laura arrived shortly. CSW introduced self and explained role. CSW discussed with family, recommendations for short term rehab at [facility] Laura, reports the patient reside at [facility]. She had been there for 1 week before she was admitted into the hospital. She states the plan is for the patient return to ALF after rehab. CSW explained the SNF process.  Preferred SNF is [facility]. CSW was give permission to send out referrals in the local and surrounding counties. All questions answered.   TOC will provide bed offers once available  TOC will continue to follow and assist with discharge planning.  Montie Louder, MSW, LCSW Clinical Social Worker     Expected Discharge Plan: Skilled Nursing Facility Barriers to Discharge: Continued Medical Work up, SNF Pending bed offer   Patient Goals and CMS Choice            Expected Discharge Plan and Services In-house Referral: Clinical Social Work     Living arrangements for the past 2 months: Assisted Living Facility                                      Prior Living Arrangements/Services Living arrangements for the past 2 months: Assisted Living Facility Lives with:: Self, Facility Resident Patient language and need for interpreter reviewed:: No        Need for Family Participation in Patient Care: Yes (Comment) Care giver support system in place?: Yes (comment)   Criminal Activity/Legal Involvement Pertinent to Current Situation/Hospitalization: No - Comment as  needed  Activities of Daily Living   ADL Screening (condition at time of admission) Independently performs ADLs?: No Does the patient have a NEW difficulty with bathing/dressing/toileting/self-feeding that is expected to last >3 days?: No Does the patient have a NEW difficulty with getting in/out of bed, walking, or climbing stairs that is expected to last >3 days?: Yes (Initiates electronic notice to provider for possible PT consult) Does the patient have a NEW difficulty with communication that is expected to last >3 days?: No Is the patient deaf or have difficulty hearing?: Yes Does the patient have difficulty seeing, even when wearing glasses/contacts?: No Does the patient have difficulty concentrating, remembering, or making decisions?: Yes  Permission Sought/Granted Permission sought to share information with : Other (comment) (patient AX3 daughter gave permission to send out clinicals)    Share Information with NAME: Laura Wilcox  Permission granted to share info w AGENCY: SNFs  Permission granted to share info w Relationship: daughter  Permission granted to share info w Contact Information: [phone]  Emotional Assessment Appearance:: Appears stated age       Alcohol  / Substance Use: Not Applicable Psych Involvement: No (comment)  Admission diagnosis:  Cellulitis [L03.90] Patient Active Problem List   Diagnosis Date Noted   Cellulitis 04/13/2023   Hematoma of left ankle 03/31/2023   Hematoma 03/31/2023   SCC (squamous cell carcinoma) 03/19/2022  Skin tear of lower leg without complication, right, initial encounter 09/24/2021   Sepsis (HCC) 09/24/2021   Leg wound, right 09/24/2021   Encounter for antineoplastic chemotherapy 09/21/2021   Senile dementia with acute confusional state, without behavioral disturbance (HCC) 06/26/2021   Depression 06/26/2021   Weakness 06/26/2021   UTI (urinary tract infection) 06/25/2021   Myelofibrosis (HCC) 01/31/2020   Other  pancytopenia (HCC) 01/31/2020   Moderate protein-calorie malnutrition (HCC)    Squamous cell cancer of skin of left cheek 01/16/2020   Chronic kidney disease, stage 3b (HCC) 03/11/2019   Secondary hyperparathyroidism of renal origin (HCC) 03/11/2019   Anemia in chronic kidney disease 03/11/2019   Elevated ferritin 02/18/2018   Goals of care, counseling/discussion 07/09/2017   Polycythemia rubra vera (HCC) 06/02/2017   Protein-calorie malnutrition, moderate (HCC) 06/02/2017   Advanced care planning/counseling discussion 06/02/2017   Age-related cognitive decline 06/02/2017   PCP:  Merilee Setter, NP Pharmacy:  No Pharmacies Listed    Social Drivers of Health (SDOH) Social History: SDOH Screenings   Food Insecurity: No Food Insecurity (04/13/2023)  Housing: Low Risk  (04/13/2023)  Transportation Needs: No Transportation Needs (04/13/2023)  Utilities: Not At Risk (04/13/2023)  Alcohol  Screen: Low Risk  (06/18/2021)  Depression (PHQ2-9): High Risk (06/18/2021)  Social Connections: Unknown (04/13/2023)  Tobacco Use: Low Risk  (04/13/2023)   SDOH Interventions:     Readmission Risk Interventions    06/26/2021    4:26 PM  Readmission Risk Prevention Plan  Transportation Screening Complete  PCP or Specialist Appt within 5-7 Days Complete  Home Care Screening Complete  Medication Review (RN CM) Complete

## 2023-04-15 NOTE — Progress Notes (Signed)
 PROGRESS NOTE    Laura Wilcox  FMW:[id number] DOB: 09-27-1926 DOA: 04/13/2023 PCP: Merilee Setter, NP    Brief Narrative:   Laura Wilcox is a 89 y.o. female with past medical history significant for advanced dementia, polycythemia vera, chronic myelofibrosis on oral chemotherapy, hypothyroidism who presented from her memory care unit to [facility] ED on 1/12 with severe pain to her left foot.  Patient unable to complete dressing changes at SNF due to pain.  Recently discharged from hospital 2 days prior with basic wound care after a large hematoma was evacuated.  Since discharge, staff unable to change bandages due to her symptoms.  Patient has pending outpatient appointment with plastic surgery, Dr. Shyrl on 04/15/2023.  In the ED, temperature 97.8 F, HR 81, RR 18, BP 163/72, SpO2 93% on room air.  WBC 12.7, hemoglobin 7.5, platelet count 595.  Sodium 137, potassium 4.3, chloride 102, CO2 23, glucose 88, BUN 24, Cram 1.04.  Left foot x-ray with no evidence for acute fracture, no subluxation or dislocation.  TRH consulted for admission.  Assessment & Plan:   Left lower extremity nonhealing wound with cellulitis PAD Patient presenting to ED with severe pain during dressing changes at SNF.  Recently admitted with large hematoma that was evacuated to the left ankle region.  Was to see plastic surgery outpatient on 04/15/2023 but given poor healing and worsening pain sought further care back in the ED.  Patient's left foot cool to touch and recent ABI showing severe PAD. -- Vascular surgery following, appreciate assistance -- WBC 12.4>12.7>13.4>12.4 -- Ceftriaxone  1 g IV every 24 hours -- Tramadol  50 mg p.o. every 6 hours as needed moderate pain -- Aspirin  81 mg p.o. daily -- N.p.o. for planned angiogram today -- CBC daily  Advanced dementia Patient currently resides in memory care unit.SABRA -- Delirium precautions -- Get up during the day -- Encourage a familiar face to remain present  throughout the day -- Keep blinds open and lights on during daylight hours -- Minimize the use of opioids/benzodiazepines --Melatonin 3 mg p.o. nightly -- TOC consulted  Myelofibrosis -- Hold home Jakafi  given active infection -- Outpatient follow-up with medical oncology  Hypothyroidism -- Levothyroxine  25 mcg p.o. daily  Anxiety/depression -- Celexa  10 mg p.o. daily  Normocytic anemia -- Hgb 9.0>7.5>7.6>7.5 -- Check anemia panel -- CBC daily  Weakness/debility/deconditioning Prior functional status, ambulatory with a walker. -- PT/OT evaluation: Pending   DVT prophylaxis: heparin  injection 5,000 Units Start: 04/13/23 2200    Code Status: Limited: Do not attempt resuscitation (DNR) -DNR-LIMITED -Do Not Intubate/DNI  Family Communication: Updated patient's daughter present at bedside this morning  Disposition Plan:  Level of care: Med-Surg Status is: Inpatient Remains inpatient appropriate because: Pending angiogram    Consultants:  Vascular surgery  Procedures:  Angiogram: Pending  Antimicrobials:  Ceftriaxone  1/12>> Vancomycin  1/12 - 1/12   Subjective: Patient seen examined bedside, resting calmly.  Lying in bed.  Daughter present.  Continues to have pain even with light touch of left lower extremity with bandage in place.  Pending angiogram.  Patient pleasantly confused, most history obtained from daughter present at bedside.  Objective: Vitals:   04/14/23 2332 04/15/23 0439 04/15/23 0828 04/15/23 1240  BP: 105/77 (!) 123/113 97/72 101/78  Pulse: 80 86    Resp: 19 18 20 16   Temp: 97.7 F (36.5 C) 97.8 F (36.6 C) (!) 97.1 F (36.2 C) 99.1 F (37.3 C)  TempSrc: Oral Oral Axillary Axillary  SpO2: 99% 99% 97% 99%  Weight:      Height:        Intake/Output Summary (Last 24 hours) at 04/15/2023 1359 Last data filed at 04/15/2023 1349 Gross per 24 hour  Intake 460 ml  Output 200 ml  Net 260 ml   Filed Weights   04/13/23 1826  Weight: 52.8 kg     Examination:  Physical Exam: GEN: NAD, somnolent but arouses to verbal stimuli, chronically ill/elderly in appearance HEENT: NCAT, PERRL, EOMI, sclera clear PULM: CTAB w/o wheezes/crackles, normal respiratory effort, on 2 L nasal cannula with SpO2 99% at rest CV: RRR w/o M/G/R GI: abd soft, NTND, NABS, no R/G/M MSK: no peripheral edema, moves all extremities independently, left lower extremity with wound depicted as below with several areas of ecchymosis in various stages of healing to extremities NEURO: No focal neurologic deficit PSYCH: Integumentary: left lower extremity with wound depicted as below with several areas of ecchymosis in various stages of healing to extremities    Data Reviewed: I have personally reviewed following labs and imaging studies  CBC: Recent Labs  Lab 04/10/23 0412 04/13/23 0930 04/14/23 0327 04/15/23 0313 04/15/23 0632  WBC 11.5* 12.4* 12.7* 13.4* 12.4*  NEUTROABS 7.5 9.0*  --   --   --   HGB 7.6* 9.0* 7.5* 7.6* 7.5*  HCT 24.5* 30.0* 24.9* 25.1* 24.6*  MCV 84.5 85.0 86.8 85.1 84.8  PLT 539* 744* 595* 635* 670*   Basic Metabolic Panel: Recent Labs  Lab 04/10/23 0412 04/13/23 0930 04/14/23 0327 04/15/23 0313  NA 137 137 137 137  K 4.4 4.3 4.3 4.2  CL 100 98 102 101  CO2 27 24 23 26   GLUCOSE 98 146* 88 92  BUN 25* 28* 24* 19  CREATININE 1.09* 1.39* 1.04* 1.10*  CALCIUM  8.5* 9.7 8.8* 9.1   GFR: Estimated Creatinine Clearance: 22.5 mL/min (A) (by C-G formula based on SCr of 1.1 mg/dL (H)). Liver Function Tests: No results for input(s): AST, ALT, ALKPHOS, BILITOT, PROT, ALBUMIN in the last 168 hours. No results for input(s): LIPASE, AMYLASE in the last 168 hours. No results for input(s): AMMONIA in the last 168 hours. Coagulation Profile: No results for input(s): INR, PROTIME in the last 168 hours. Cardiac Enzymes: No results for input(s): CKTOTAL, CKMB, CKMBINDEX, TROPONINI in the last 168 hours. BNP  (last 3 results) No results for input(s): PROBNP in the last 8760 hours. HbA1C: No results for input(s): HGBA1C in the last 72 hours. CBG: No results for input(s): GLUCAP in the last 168 hours. Lipid Profile: No results for input(s): CHOL, HDL, LDLCALC, TRIG, CHOLHDL, LDLDIRECT in the last 72 hours. Thyroid  Function Tests: No results for input(s): TSH, T4TOTAL, FREET4, T3FREE, THYROIDAB in the last 72 hours. Anemia Panel: No results for input(s): VITAMINB12, FOLATE, FERRITIN, TIBC, IRON , RETICCTPCT in the last 72 hours. Sepsis Labs: No results for input(s): PROCALCITON, LATICACIDVEN in the last 168 hours.  No results found for this or any previous visit (from the past 240 hours).       Radiology Studies: No results found.      Scheduled Meds:  acetaminophen   500 mg Oral Q6H   aspirin  EC  81 mg Oral Daily   calcitRIOL   0.25 mcg Oral Q M,W,F   cholecalciferol   1,000 Units Oral Daily   citalopram   10 mg Oral Daily   cyanocobalamin   1,000 mcg Oral Daily   feeding supplement  237 mL Oral BID BM   heparin   5,000 Units Subcutaneous Q8H   levothyroxine   25  mcg Oral Q0600   multivitamin with minerals  1 tablet Oral Daily   Continuous Infusions:  cefTRIAXone  (ROCEPHIN )  IV 1 g (04/15/23 1331)     LOS: 2 days    Time spent:  minutes spent on chart review, discussion with nursing staff, consultants, updating family and interview/physical exam; more than 50% of that time was spent in counseling and/or coordination of care.    Camellia PARAS Irianna Gilday, DO [facility] Available via Epic secure chat 7am-7pm After these hours, please refer to coverage provider listed on [email or web address] 04/15/2023, 1:59 PM

## 2023-04-15 NOTE — Progress Notes (Signed)
 [facility] Liaison Note:  This patient is currently enrolled in [facility] outpatient-based palliative care.   Please call for any outpatient based palliative care related questions or concerns.  Thank you, Glenna Fellows, BSN, RN, OCN [facility] Liaison [phone]

## 2023-04-16 ENCOUNTER — Encounter (HOSPITAL_COMMUNITY): Payer: Self-pay | Admitting: Surgery

## 2023-04-16 DIAGNOSIS — I70209 Unspecified atherosclerosis of native arteries of extremities, unspecified extremity: Secondary | ICD-10-CM | POA: Diagnosis not present

## 2023-04-16 DIAGNOSIS — L03116 Cellulitis of left lower limb: Secondary | ICD-10-CM | POA: Diagnosis not present

## 2023-04-16 LAB — RETICULOCYTES
Immature Retic Fract: 35 % — ABNORMAL HIGH (ref 2.3–15.9)
RBC.: 3.19 MIL/uL — ABNORMAL LOW (ref 3.87–5.11)
Retic Count, Absolute: 127 10*3/uL (ref 19.0–186.0)
Retic Ct Pct: 4 % — ABNORMAL HIGH (ref 0.4–3.1)

## 2023-04-16 LAB — LIPID PANEL
Cholesterol: 157 mg/dL (ref 0–200)
HDL: 37 mg/dL — ABNORMAL LOW (ref >40)
LDL Cholesterol: 100 mg/dL — ABNORMAL HIGH (ref 0–99)
Total CHOL/HDL Ratio: 4.2 {ratio}
Triglycerides: 101 mg/dL (ref <150)
VLDL: 20 mg/dL (ref 0–40)

## 2023-04-16 LAB — FERRITIN: Ferritin: 446 ng/mL — ABNORMAL HIGH (ref 11–307)

## 2023-04-16 LAB — CBC
HCT: 27.8 % — ABNORMAL LOW (ref 36.0–46.0)
Hemoglobin: 8.4 g/dL — ABNORMAL LOW (ref 12.0–15.0)
MCH: 26.3 pg (ref 26.0–34.0)
MCHC: 30.2 g/dL (ref 30.0–36.0)
MCV: 87.1 fL (ref 80.0–100.0)
Platelets: 678 10*3/uL — ABNORMAL HIGH (ref 150–400)
RBC: 3.19 MIL/uL — ABNORMAL LOW (ref 3.87–5.11)
RDW: 28.3 % — ABNORMAL HIGH (ref 11.5–15.5)
WBC: 16.1 10*3/uL — ABNORMAL HIGH (ref 4.0–10.5)
nRBC: 0.4 % — ABNORMAL HIGH (ref 0.0–0.2)

## 2023-04-16 LAB — BASIC METABOLIC PANEL
Anion gap: 13 (ref 5–15)
BUN: 14 mg/dL (ref 8–23)
CO2: 22 mmol/L (ref 22–32)
Calcium: 9 mg/dL (ref 8.9–10.3)
Chloride: 103 mmol/L (ref 98–111)
Creatinine, Ser: 1.11 mg/dL — ABNORMAL HIGH (ref 0.44–1.00)
GFR, Estimated: 46 mL/min — ABNORMAL LOW (ref >60)
Glucose, Bld: 77 mg/dL (ref 70–99)
Potassium: 4.8 mmol/L (ref 3.5–5.1)
Sodium: 138 mmol/L (ref 135–145)

## 2023-04-16 LAB — FOLATE: Folate: >40 ng/mL (ref >5.9)

## 2023-04-16 LAB — IRON AND TIBC
Iron: 16 ug/dL — ABNORMAL LOW (ref 28–170)
Saturation Ratios: 7 % — ABNORMAL LOW (ref 10.4–31.8)
TIBC: 231 ug/dL — ABNORMAL LOW (ref 250–450)
UIBC: 215 ug/dL

## 2023-04-16 LAB — MAGNESIUM: Magnesium: 2 mg/dL (ref 1.7–2.4)

## 2023-04-16 LAB — PHOSPHORUS: Phosphorus: 4.4 mg/dL (ref 2.5–4.6)

## 2023-04-16 LAB — VITAMIN B12: Vitamin B-12: 1559 pg/mL — ABNORMAL HIGH (ref 180–914)

## 2023-04-16 MED ORDER — ROSUVASTATIN CALCIUM 5 MG PO TABS
10.0000 mg | ORAL_TABLET | Freq: Every day | ORAL | Status: DC
  Administered 2023-04-17: 10 mg via ORAL
  Filled 2023-04-16: qty 2

## 2023-04-16 NOTE — Progress Notes (Signed)
 Arlin Benes [phone] [facility] Liaison Note  This patient is currently enrolled in [facility] outpatient based palliative care.  We will continue to follow for discharge disposition.  Please call with any questions or concerns.  Thank you, Lestine Rathke, BSN, [facility] [phone]

## 2023-04-16 NOTE — Progress Notes (Addendum)
  Progress Note    04/16/2023 6:55 AM 1 Day Post-Op  Subjective:  right groin is soft  Afebrile   Vitals:   04/16/23 0007 04/16/23 0412  BP: 106/60 (!) 145/58  Pulse: 85 87  Resp: 19 19  Temp: 97.9 F (36.6 C) 97.9 F (36.6 C)  SpO2: 99% 99%    Physical Exam: General:  resting and wakes briefly Cardiac:  regular Lungs:  non labored Incisions:  right groin is soft Extremities:  + PT doppler flow above bandage    CBC    Component Value Date/Time   WBC 12.4 (H) 04/15/2023 0632   RBC 2.90 (L) 04/15/2023 0632   HGB 7.5 (L) 04/15/2023 0632   HGB 10.3 (L) 02/25/2023 1347   HGB 8.1 (L) 06/18/2021 1514   HCT 24.6 (L) 04/15/2023 0632   HCT 25.2 (L) 06/18/2021 1514   PLT 670 (H) 04/15/2023 0632   PLT 677 (H) 02/25/2023 1347   PLT 452 (H) 06/18/2021 1514   MCV 84.8 04/15/2023 0632   MCV 91 06/18/2021 1514   MCH 25.9 (L) 04/15/2023 0632   MCHC 30.5 04/15/2023 0632   RDW 28.4 (H) 04/15/2023 0632   RDW 20.1 (H) 06/18/2021 1514   LYMPHSABS 0.8 04/13/2023 0930   LYMPHSABS 1.0 06/18/2021 1514   MONOABS 1.1 (H) 04/13/2023 0930   EOSABS 0.7 (H) 04/13/2023 0930   EOSABS 0.4 06/18/2021 1514   BASOSABS 0.2 (H) 04/13/2023 0930   BASOSABS 0.3 (H) 06/18/2021 1514    BMET    Component Value Date/Time   NA 137 04/15/2023 0313   NA 134 06/18/2021 1514   K 4.2 04/15/2023 0313   CL 101 04/15/2023 0313   CO2 26 04/15/2023 0313   GLUCOSE 92 04/15/2023 0313   BUN 19 04/15/2023 0313   BUN 19 06/18/2021 1514   CREATININE 1.10 (H) 04/15/2023 0313   CREATININE 1.41 (H) 12/30/2022 1118   CALCIUM  9.1 04/15/2023 0313   GFRNONAA 47 (L) 04/15/2023 0313   GFRNONAA 35 (L) 12/30/2022 1118   GFRAA 49 (L) 12/27/2019 0935    INR    Component Value Date/Time   INR 1.0 03/31/2023 0933     Intake/Output Summary (Last 24 hours) at 04/16/2023 0655 Last data filed at 04/16/2023 0507 Gross per 24 hour  Intake 913.36 ml  Output 650 ml  Net 263.36 ml      Assessment/Plan:  89 y.o.  female is s/p:  S/p angiogram with stenting of left SFA via right CFA  1 Day Post-Op   -pt has + doppler flow left PT above bandage -pt only placed on asa 81mg  daily and not Plavix given high risk of bleeding and frailty.  -DVT prophylaxis:  sq heparin  -pt will need Plastics consult with Dr. Ladd Picker, PA-C [facility] [phone] 04/16/2023 6:55 AM  I agree with the above.  Patient seen and examined.   I spoke with Dr. Orin Birk who will come and see the patient for wound evaluation  Wells Lamond Glantz

## 2023-04-16 NOTE — TOC Progression Note (Signed)
 Transition of Care [facility]) - Progression Note    Patient Details  Name: Laura Wilcox MRN: [id number] Date of Birth: 08-22-1926  Transition of [facility]) CM/SW Contact  Valery Gaucher, [place] Phone Number: 04/16/2023, 3:08 PM  Clinical Narrative:     Informed patient's daughter of bed offers. Including, faxed clinicals to [facility] as requested by family.   TOC is waiting on SNF choice from the family.   TOC will continue to follow and assist with discharge planning.  Liddie Reel, MSW, LCSW Clinical Social Worker    Expected Discharge Plan: Skilled Nursing Facility Barriers to Discharge: Continued Medical Work up, SNF Pending bed offer  Expected Discharge Plan and Services In-house Referral: Clinical Social Work     Living arrangements for the past 2 months: Assisted Living Facility                                       Social Determinants of Health (SDOH) Interventions SDOH Screenings   Food Insecurity: No Food Insecurity (04/13/2023)  Housing: Low Risk  (04/13/2023)  Transportation Needs: No Transportation Needs (04/13/2023)  Utilities: Not At Risk (04/13/2023)  Alcohol  Screen: Low Risk  (06/18/2021)  Depression (PHQ2-9): High Risk (06/18/2021)  Social Connections: Unknown (04/13/2023)  Tobacco Use: Low Risk  (04/13/2023)    Readmission Risk Interventions    06/26/2021    4:26 PM  Readmission Risk Prevention Plan  Transportation Screening Complete  PCP or Specialist Appt within 5-7 Days Complete  Home Care Screening Complete  Medication Review (RN CM) Complete

## 2023-04-16 NOTE — Progress Notes (Signed)
 PHARMACIST LIPID MONITORING   Laura Wilcox is a 89 y.o. female admitted on 04/13/2023 with PVD.  Pharmacy has been consulted to optimize lipid-lowering therapy with the indication of secondary prevention for clinical ASCVD.  Recent Labs:  Lipid Panel (last 6 months):   Lab Results  Component Value Date   CHOL 157 04/16/2023   TRIG 101 04/16/2023   HDL 37 (L) 04/16/2023   CHOLHDL 4.2 04/16/2023   VLDL 20 04/16/2023   LDLCALC 100 (H) 04/16/2023    Hepatic function panel (last 6 months):   Lab Results  Component Value Date   AST 18 03/31/2023   ALT 12 03/31/2023   ALKPHOS 129 (H) 03/31/2023   BILITOT 0.8 03/31/2023    SCr (since admission):   Serum creatinine: 1.11 mg/dL (H) 78/46/96 2952 Estimated creatinine clearance: 22.3 mL/min (A)  Current therapy and lipid therapy tolerance Current lipid-lowering therapy: none Previous lipid-lowering therapies (if applicable): none Documented or reported allergies or intolerances to lipid-lowering therapies (if applicable): none  Assessment:   43 female with PAD stent. I spoke with family about starting something for cholesterol management. Given her ability to communicate about potential intolerance, plans are to use low dose rosuvastatin   Plan:    1.Statin intensity (high intensity recommended for all patients regardless of the LDL):  Add rosuvastatin  10mg  daily  2.Add ezetimibe (if any one of the following):   Not indicated at this time.  3.Refer to lipid clinic:   No  4.Follow-up with:  Primary care provider - Nadine Aus, NP  5.Follow-up labs after discharge:  Changes in lipid therapy were made. Check a lipid panel in 8-12 weeks then annually.     Baxter Limber, PharmD Clinical Pharmacist **Pharmacist phone directory can now be found on [email or web address] ([email or web address]).  Listed under [facility].

## 2023-04-16 NOTE — Progress Notes (Signed)
 PROGRESS NOTE    Murial Bedrosian  ZOX:[id number] DOB: Nov 06, 1926 DOA: 04/13/2023 PCP: Nadine Aus, NP    Brief Narrative:   Laura Wilcox is a 89 y.o. female with past medical history significant for advanced dementia, polycythemia vera, chronic myelofibrosis on oral chemotherapy, hypothyroidism who presented from her memory care unit to [facility] ED on 1/12 with severe pain to her left foot.  Patient unable to complete dressing changes at SNF due to pain.  Recently discharged from hospital 2 days prior with basic wound care after a large hematoma was evacuated.  Since discharge, staff unable to change bandages due to her symptoms.  Patient has pending outpatient appointment with plastic surgery, Dr. Luvenia Salvage on 04/15/2023.  In the ED, temperature 97.8 F, HR 81, RR 18, BP 163/72, SpO2 93% on room air.  WBC 12.7, hemoglobin 7.5, platelet count 595.  Sodium 137, potassium 4.3, chloride 102, CO2 23, glucose 88, BUN 24, Cram 1.04.  Left foot x-ray with no evidence for acute fracture, no subluxation or dislocation.  TRH consulted for admission.  Assessment & Plan:   Left lower extremity nonhealing wound with cellulitis PAD s/p right SFA stenting Patient presenting to ED with severe pain during dressing changes at SNF.  Recently admitted with large hematoma that was evacuated to the left ankle region.  Was to see plastic surgery outpatient on 04/15/2023 but given poor healing and worsening pain sought further care back in the ED.  Patient's left foot cool to touch and recent ABI showing severe PAD.  Vascular surgery was consulted and patient underwent angiogram with stenting of left SFA by Dr. Charlotte Cookey on 04/15/2023.  -- Vascular surgery following, appreciate assistance -- WBC 12.4>12.7>13.4>12.4>16.1 -- LDL 100, HDL 37 -- Ceftriaxone  1 g IV every 24 hours -- Tramadol  50 mg p.o. every 6 hours as needed moderate pain -- Aspirin  81 mg p.o. daily -- Crestor  10 mg p.o. daily -- Plastic surgery  consulted for evaluation of nonhealing wound -- CBC daily  Advanced dementia Patient currently resides in memory care unit.Aaron Aas -- Delirium precautions -- Get up during the day -- Encourage a familiar face to remain present throughout the day -- Keep blinds open and lights on during daylight hours -- Minimize the use of opioids/benzodiazepines -- Melatonin 3 mg p.o. nightly -- TOC consulted  Myelofibrosis -- Hold home Jakafi  given active infection -- Outpatient follow-up with medical oncology  Hypothyroidism -- Levothyroxine  25 mcg p.o. daily  Anxiety/depression -- Celexa  10 mg p.o. daily  Normocytic anemia Anemia of chronic medical disease Anemia panel with iron  16, TIBC low at 231, ferritin 446, B12 1559 and folate greater than 40. -- Hgb 9.0>7.5>7.6>7.5>8.4 -- Transfuse for hemoglobin less than 7.0 -- CBC daily  Weakness/debility/deconditioning Prior functional status, ambulatory with a walker. -- PT/OT recommend SNF placement -- Continue therapy efforts while inpatient -- TOC consulted for placement   DVT prophylaxis: heparin  injection 5,000 Units Start: 04/13/23 2200    Code Status: Limited: Do not attempt resuscitation (DNR) -DNR-LIMITED -Do Not Intubate/DNI  Family Communication: Updated patient's daughter present at bedside this morning  Disposition Plan:  Level of care: Med-Surg Status is: Inpatient Remains inpatient appropriate because: Pending plastic surgery evaluation, will need vascular surgery to sign off.    Consultants:  Vascular surgery Plastic surgery  Procedures:  Angiogram: Pending  Antimicrobials:  Ceftriaxone  1/12>> Vancomycin  1/12 - 1/12   Subjective: Patient seen examined bedside, resting calmly.  Lying in bed.  No family present.  Phlebotomy tech present obtaining blood sample.  Underwent angiogram with stenting to SFA yesterday.  Plastic surgery consulted by vascular surgery today.  Remains pleasantly confused.  No acute events  overnight per nursing staff.  Objective: Vitals:   04/16/23 0412 04/16/23 0802 04/16/23 1006 04/16/23 1202  BP: (!) 145/58 (!) 98/50  106/60  Pulse: 87 60    Resp: 19 18 17 20   Temp: 97.9 F (36.6 C) 98.8 F (37.1 C)  97.6 F (36.4 C)  TempSrc: Oral Oral  Oral  SpO2: 99%  93% (!) 89%  Weight:      Height:        Intake/Output Summary (Last 24 hours) at 04/16/2023 1211 Last data filed at 04/16/2023 0507 Gross per 24 hour  Intake 913.36 ml  Output 450 ml  Net 463.36 ml   Filed Weights   04/13/23 1826  Weight: 52.8 kg    Examination:  Physical Exam: GEN: NAD, somnolent but arouses to verbal stimuli, chronically ill/elderly in appearance HEENT: NCAT, PERRL, EOMI, sclera clear PULM: CTAB w/o wheezes/crackles, normal respiratory effort, on 2 L nasal cannula with SpO2 100% at rest CV: RRR w/o M/G/R GI: abd soft, NTND, NABS, no R/G/M MSK: no peripheral edema, moves all extremities independently, left lower extremity with wound depicted as below with several areas of ecchymosis in various stages of healing to extremities NEURO: No focal neurologic deficit PSYCH: Integumentary: left lower extremity with wound depicted as below with several areas of ecchymosis in various stages of healing to extremities    Data Reviewed: I have personally reviewed following labs and imaging studies  CBC: Recent Labs  Lab 04/10/23 0412 04/13/23 0930 04/14/23 0327 04/15/23 0313 04/15/23 0632 04/16/23 0911  WBC 11.5* 12.4* 12.7* 13.4* 12.4* 16.1*  NEUTROABS 7.5 9.0*  --   --   --   --   HGB 7.6* 9.0* 7.5* 7.6* 7.5* 8.4*  HCT 24.5* 30.0* 24.9* 25.1* 24.6* 27.8*  MCV 84.5 85.0 86.8 85.1 84.8 87.1  PLT 539* 744* 595* 635* 670* 678*   Basic Metabolic Panel: Recent Labs  Lab 04/10/23 0412 04/13/23 0930 04/14/23 0327 04/15/23 0313 04/16/23 0911  NA 137 137 137 137 138  K 4.4 4.3 4.3 4.2 4.8  CL 100 98 102 101 103  CO2 27 24 23 26 22   GLUCOSE 98 146* 88 92 77  BUN 25* 28* 24* 19  14  CREATININE 1.09* 1.39* 1.04* 1.10* 1.11*  CALCIUM  8.5* 9.7 8.8* 9.1 9.0  MG  --   --   --   --  2.0  PHOS  --   --   --   --  4.4   GFR: Estimated Creatinine Clearance: 22.3 mL/min (A) (by C-G formula based on SCr of 1.11 mg/dL (H)). Liver Function Tests: No results for input(s): "AST", "ALT", "ALKPHOS", "BILITOT", "PROT", "ALBUMIN" in the last 168 hours. No results for input(s): "LIPASE", "AMYLASE" in the last 168 hours. No results for input(s): "AMMONIA" in the last 168 hours. Coagulation Profile: No results for input(s): "INR", "PROTIME" in the last 168 hours. Cardiac Enzymes: No results for input(s): "CKTOTAL", "CKMB", "CKMBINDEX", "TROPONINI" in the last 168 hours. BNP (last 3 results) No results for input(s): "PROBNP" in the last 8760 hours. HbA1C: No results for input(s): "HGBA1C" in the last 72 hours. CBG: Recent Labs  Lab 04/15/23 1603  GLUCAP 74   Lipid Profile: Recent Labs    04/16/23 0911  CHOL 157  HDL 37*  LDLCALC 100*  TRIG 101  CHOLHDL 4.2   Thyroid  Function Tests:  No results for input(s): "TSH", "T4TOTAL", "FREET4", "T3FREE", "THYROIDAB" in the last 72 hours. Anemia Panel: Recent Labs    04/16/23 0911  VITAMINB12 1,559*  FOLATE >40.0  FERRITIN 446*  TIBC 231*  IRON  16*  RETICCTPCT 4.0*   Sepsis Labs: No results for input(s): "PROCALCITON", "LATICACIDVEN" in the last 168 hours.  No results found for this or any previous visit (from the past 240 hours).       Radiology Studies: PERIPHERAL VASCULAR CATHETERIZATION Result Date: 04/15/2023 Images from the original result were not included. Patient name: Arliene Tomek MRN: [id number] DOB: 02/05/1927 Sex: female 04/15/2023 Pre-operative Diagnosis: Left leg ulcer Post-operative diagnosis:  Same Surgeon:  Gareld June Procedure Performed:  1.  Ultrasound-guided access, right femoral artery  2.  Abdominal aortogram  3.  Left lower extremity angiogram  4.  Selective imaging with cath in the left  popliteal artery  5.  Stent, left superficial femoral-popliteal artery  6.  Conscious sedation, 54 minutes  7.  Closure device, Celt Indications: This is a 89 year old female who suffered a fall about 2 weeks ago with a significant left ankle hematoma requiring surgical evacuation creating a large wound.  She has had deterioration of the wound.  ABIs were suboptimal for healing and so she comes in today for angiography. Procedure:  The patient was identified in the holding area and taken to room 8.  The patient was then placed supine on the table and prepped and draped in the usual sterile fashion.  A time out was called.  Conscious sedation was administered with the use of IV fentanyl  and Versed  under continuous physician and nurse monitoring.  Heart rate, blood pressure, and oxygen saturation were continuously monitored.  Total sedation time was 54 minutes.  Ultrasound was used to evaluate the right common femoral artery.  It was patent .  A digital ultrasound image was acquired.  A micropuncture needle was used to access the right common femoral artery under ultrasound guidance.  An 018 wire was advanced without resistance and a micropuncture sheath was placed.  The 018 wire was removed and a benson wire was placed.  The micropuncture sheath was exchanged for a 5 french sheath.  An omniflush catheter was advanced over the wire to the level of L-1.  An abdominal angiogram was obtained.  Next, using the omniflush catheter and a benson wire, the aortic bifurcation was crossed and the catheter was placed into theleft external iliac artery and left runoff was obtained.  Findings:  Aortogram: No significant renal stenosis identified.  The infrarenal abdominal aorta is calcified but patent without significant stenosis.  Greater than 50% right common iliac stenosis.  The left common and external iliac and the right extrailiac artery are widely patent.  Right Lower Extremity: The right common femoral and profundofemoral  artery are patent throughout the course.  The superficial femoral artery is occluded just beyond its origin.  There is a long segment occlusion with reconstitution of the above-knee popliteal artery and single-vessel runoff via the peroneal artery.  Left Lower Extremity: Not evaluated Intervention: After the above images were acquired the decision was made to proceed with intervention.  A 6 x 45 sheath was inserted into the left external iliac artery.  The patient was fully heparinized.  Next, using a 035 Glidewire and a quick cross catheter, subintimal recanalization was performed.  A contrast injection was performed with the catheter in the above-knee popliteal artery, confirming successful crossing of the lesion.  Next, a 035 versa core  wire was placed.  The subintimal track was dilated with a 4 mm balloon and then overlapping 6 mm Eluvia stents were deployed to treat the entire length of the lesion (6 x 150 x 2, 6 x 40).  The stents were dilated with a 5 mm balloon.  There was a residual waist up near the origin of the stents which was treated again with balloon angioplasty.  Completion imaging showed residual in-stent narrowing in the proximal portion of the stents.  The remaining portion of the stents are widely patent with preserved single-vessel runoff via the peroneal artery.  The long sheath was exchanged out for a short 6 French sheath and a Celt was sutured closure without complication Impression:  #1  Occluded right superficial femoral artery, successfully crossed and treated with primary stenting using 6 mm drug-eluting Eluvia stents.  #2  Single-vessel runoff via the peroneal artery  #3  Will only treat with 81 mg of aspirin  and not Plavix given her high risk of bleeding and frailty.  Reinaldo Caras, M.D., FACS [facility] Office: [phone] Pager:  [phone]        Scheduled Meds:  acetaminophen   500 mg Oral Q6H   aspirin  EC  81 mg Oral Daily    calcitRIOL   0.25 mcg Oral Q M,W,F   cholecalciferol   1,000 Units Oral Daily   citalopram   10 mg Oral Daily   cyanocobalamin   1,000 mcg Oral Daily   feeding supplement  237 mL Oral BID BM   heparin   5,000 Units Subcutaneous Q8H   levothyroxine   25 mcg Oral Q0600   melatonin  3 mg Oral QHS   multivitamin with minerals  1 tablet Oral Daily   [START ON 04/17/2023] rosuvastatin   10 mg Oral Daily   sodium chloride  flush  3 mL Intravenous Q12H   Continuous Infusions:  sodium chloride      cefTRIAXone  (ROCEPHIN )  IV 1 g (04/16/23 1200)     LOS: 3 days    Time spent: 52 minutes spent on chart review, discussion with nursing staff, consultants, updating family and interview/physical exam; more than 50% of that time was spent in counseling and/or coordination of care.    Rema Care [place], DO [facility] Available via Epic secure chat 7am-7pm After these hours, please refer to coverage provider listed on [email or web address] 04/16/2023, 12:11 PM

## 2023-04-16 NOTE — Consult Note (Signed)
1 Day Post-Op  Subjective: Patient is a 89 year old female with history of a wound to her left lower extremity.  She recently had a large hematoma evacuated to her left lower extremity and was discharged but returned to the hospital due to increased pain to the left lower extremity wound.  Since hospitalization, patient was noted to have significant PAD in her left lower extremity requiring right SFA stenting.  Plastic surgery was consulted to assist in the management of her wound.  Upon entering the room today, patient is lying in bed in no acute distress.  She is accompanied by her son and daughter at bedside.  They report that patient has been receiving antibiotics since Sunday.  Reports patient has discomfort during dressing changes.  Patient's daughter states that patient did have surgery back in early January and was put to sleep without any difficulties.  Objective: Vital signs in last 24 hours: Temp:  [97.6 F (36.4 C)-98.9 F (37.2 C)] 97.6 F (36.4 C) (01/15 1202) Pulse Rate:  [0-90] 60 (01/15 0802) Resp:  [15-35] 20 (01/15 1202) BP: (87-160)/(50-102) 106/60 (01/15 1202) SpO2:  [69 %-100 %] 89 % (01/15 1202) Last BM Date : 04/13/23  Intake/Output from previous day: 01/14 0701 - 01/15 0700 In: 913.4 [P.O.:120; I.V.:656.5; IV Piggyback:136.9] Out: 650 [Urine:650] Intake/Output this shift: No intake/output data recorded.  General appearance: alert, cooperative, and no distress Resp: Unlabored breathing, no respiratory distress Extremities: Exam somewhat limited due to patient's pain.  Patient has an approximately 6 x 8 cm wound to the lateral aspect of the left lower extremity.  There is some superficial eschar noted to the wound.  Otherwise wound has exudate as long as some healthy appearing tissue throughout.  The skin surrounding the wound appears erythematous and has some tenderness palpation consistent with cellulitis.  Lab Results:     Latest Ref Rng & Units 04/16/2023     9:11 AM 04/15/2023    6:32 AM 04/15/2023    3:13 AM  CBC  WBC 4.0 - 10.5 K/uL 16.1  12.4  13.4   Hemoglobin 12.0 - 15.0 g/dL 8.4  7.5  7.6   Hematocrit 36.0 - 46.0 % 27.8  24.6  25.1   Platelets 150 - 400 K/uL 678  670  635     BMET Recent Labs    04/15/23 0313 04/16/23 0911  NA 137 138  K 4.2 4.8  CL 101 103  CO2 26 22  GLUCOSE 92 77  BUN 19 14  CREATININE 1.10* 1.11*  CALCIUM 9.1 9.0   PT/INR No results for input(s): "LABPROT", "INR" in the last 72 hours. ABG No results for input(s): "PHART", "HCO3" in the last 72 hours.  Invalid input(s): "PCO2", "PO2"  Studies/Results: PERIPHERAL VASCULAR CATHETERIZATION Result Date: 04/15/2023 Images from the original result were not included. Patient name: Laura Wilcox MRN: [id number] DOB: 08/19/1926 Sex: female 04/15/2023 Pre-operative Diagnosis: Left leg ulcer Post-operative diagnosis:  Same Surgeon:  Durene Cal Procedure Performed:  1.  Ultrasound-guided access, right femoral artery  2.  Abdominal aortogram  3.  Left lower extremity angiogram  4.  Selective imaging with cath in the left popliteal artery  5.  Stent, left superficial femoral-popliteal artery  6.  Conscious sedation, 54 minutes  7.  Closure device, Celt Indications: This is a 89 year old female who suffered a fall about 2 weeks ago with a significant left ankle hematoma requiring surgical evacuation creating a large wound.  She has had deterioration of the wound.  ABIs were  suboptimal for healing and so she comes in today for angiography. Procedure:  The patient was identified in the holding area and taken to room 8.  The patient was then placed supine on the table and prepped and draped in the usual sterile fashion.  A time out was called.  Conscious sedation was administered with the use of IV fentanyl and Versed under continuous physician and nurse monitoring.  Heart rate, blood pressure, and oxygen saturation were continuously monitored.  Total sedation time was 54  minutes.  Ultrasound was used to evaluate the right common femoral artery.  It was patent .  A digital ultrasound image was acquired.  A micropuncture needle was used to access the right common femoral artery under ultrasound guidance.  An 018 wire was advanced without resistance and a micropuncture sheath was placed.  The 018 wire was removed and a benson wire was placed.  The micropuncture sheath was exchanged for a 5 french sheath.  An omniflush catheter was advanced over the wire to the level of L-1.  An abdominal angiogram was obtained.  Next, using the omniflush catheter and a benson wire, the aortic bifurcation was crossed and the catheter was placed into theleft external iliac artery and left runoff was obtained.  Findings:  Aortogram: No significant renal stenosis identified.  The infrarenal abdominal aorta is calcified but patent without significant stenosis.  Greater than 50% right common iliac stenosis.  The left common and external iliac and the right extrailiac artery are widely patent.  Right Lower Extremity: The right common femoral and profundofemoral artery are patent throughout the course.  The superficial femoral artery is occluded just beyond its origin.  There is a long segment occlusion with reconstitution of the above-knee popliteal artery and single-vessel runoff via the peroneal artery.  Left Lower Extremity: Not evaluated Intervention: After the above images were acquired the decision was made to proceed with intervention.  A 6 x 45 sheath was inserted into the left external iliac artery.  The patient was fully heparinized.  Next, using a 035 Glidewire and a quick cross catheter, subintimal recanalization was performed.  A contrast injection was performed with the catheter in the above-knee popliteal artery, confirming successful crossing of the lesion.  Next, a 035 versa core wire was placed.  The subintimal track was dilated with a 4 mm balloon and then overlapping 6 mm Eluvia stents  were deployed to treat the entire length of the lesion (6 x 150 x 2, 6 x 40).  The stents were dilated with a 5 mm balloon.  There was a residual waist up near the origin of the stents which was treated again with balloon angioplasty.  Completion imaging showed residual in-stent narrowing in the proximal portion of the stents.  The remaining portion of the stents are widely patent with preserved single-vessel runoff via the peroneal artery.  The long sheath was exchanged out for a short 6 French sheath and a Celt was sutured closure without complication Impression:  #1  Occluded right superficial femoral artery, successfully crossed and treated with primary stenting using 6 mm drug-eluting Eluvia stents.  #2  Single-vessel runoff via the peroneal artery  #3  Will only treat with 81 mg of aspirin and not Plavix given her high risk of bleeding and frailty.  Juleen China, M.D., FACS [facility] Office: [phone] Pager:  [phone]    Anti-infectives: Anti-infectives (From admission, onward)    Start     Dose/Rate Route  Frequency Ordered Stop   04/15/23 1400  vancomycin (VANCOREADY) IVPB 750 mg/150 mL  Status:  Discontinued        750 mg 150 mL/hr over 60 Minutes Intravenous Every 48 hours 04/13/23 1920 04/14/23 1510   04/14/23 1200  cefTRIAXone (ROCEPHIN) 1 g in sodium chloride 0.9 % 100 mL IVPB        1 g 200 mL/hr over 30 Minutes Intravenous Every 24 hours 04/13/23 1914         Assessment/Plan: s/p Procedure(s): ABDOMINAL AORTOGRAM W/LOWER EXTREMITY PERIPHERAL VASCULAR BALLOON ANGIOPLASTY PERIPHERAL VASCULAR INTERVENTION Plan: -Per Dr. Ulice Bold, plan is take patient for debridement of the wound as well as myriad placement.  I discussed this with the son and daughter and they were in agreement with this plan.  -I also discussed the possibility of wound VAC.  Patient's daughter and son expressed understanding. -Recommend placing Vashe soaked gauze to  the wound for 10 minutes/day, remove the gauze and apply Adaptic, K-Y jelly, Vashe soaked gauze and wrapped in Kerlix.  Recommend changing dressings daily. -Medical management and antibiotic management per primary  Pictures were obtained of the patient and placed in the chart with the patient's or guardian's permission.    LOS: 3 days    [name], PA-C 04/16/2023

## 2023-04-16 NOTE — Evaluation (Signed)
 Occupational Therapy Evaluation Patient Details Name: Laura Wilcox MRN: [id number] DOB: Aug 24, 1926 Today's Date: 04/16/2023   History of Present Illness 89 y.o. female sent in from her memory care unit 1/12 secondary to pain in L foot/ankle. Vascular consulted and recommended transfer to [facility] for lower extremity angiogram. Now s/p LLE angiogram and stent L superficial popliteal artery 1/14. PMH: advanced dementia, polycythemia vera, chronic myelofibrosis on oral chemotherapy, L ankle hematoma and laceration/fall 04/2023, s/p I&D L ankle 04/02/2023 at [facility]   Clinical Impression   PTA, pt from memory care and per chart was mobilizing with RW and performing ADL with supervision prior to January 2025. Upon eval, pt with LLE pain affecting functional participation in ADL and mobility. Pt performing LB ADL at max-total A level as well as toileting. Requiring +2 A for transfers at this time secondary to poor tolerance of weight bearing on LLE. Pt oriented to self and location; unsure of her baseline. Pt following one step commands with increased time. Will continue to follow.       If plan is discharge home, recommend the following: Two people to help with walking and/or transfers;A lot of help with bathing/dressing/bathroom;Direct supervision/assist for medications management;Supervision due to cognitive status;Direct supervision/assist for financial management;Assist for transportation;Help with stairs or ramp for entrance;Assistance with cooking/housework;Assistance with feeding    Functional Status Assessment  Patient has had a recent decline in their functional status and/or demonstrates limited ability to make significant improvements in function in a reasonable and predictable amount of time  Equipment Recommendations  Other (comment) (defer next venue)    Recommendations for Other Services       Precautions / Restrictions Precautions Precautions: Fall Restrictions Weight Bearing  Restrictions Per Provider Order: Yes      Mobility Bed Mobility Overal bed mobility: Needs Assistance Bed Mobility: Supine to Sit, Sit to Supine     Supine to sit: Mod assist Sit to supine: Mod assist   General bed mobility comments: increased time and erffort with heavy cueing for coming to EOB and light guidance for BLE. Min A for truncal elevation    Transfers Overall transfer level: Needs assistance Equipment used: 2 person hand held assist Transfers: Sit to/from Stand Sit to Stand: Mod assist, Max assist, +2 safety/equipment, +2 physical assistance           General transfer comment: able to tolerate a little weight through LLE this session with slightly in front of R to offload. Mod A of 2 on first attempt max on second attempt with continued LLE pain throughout      Balance Overall balance assessment: Needs assistance Sitting-balance support: Feet supported, Bilateral upper extremity supported, Single extremity supported Sitting balance-Leahy Scale: Fair Sitting balance - Comments: able to sit at EOB with supervision   Standing balance support: Bilateral upper extremity supported, During functional activity, Reliant on assistive device for balance Standing balance-Leahy Scale: Zero                             ADL either performed or assessed with clinical judgement   ADL Overall ADL's : Needs assistance/impaired     Grooming: Wash/dry face;Set up;Sitting Grooming Details (indicate cue type and reason): provided wash cloth and bed level and pt stating she cannot, but then at EOB able to perform without difficulty on command.     Lower Body Bathing: Maximal assistance;Sitting/lateral leans Lower Body Bathing Details (indicate cue type and reason): able to  clean top of LE and anterior perineal area seated. Max A for back of legs, lower leg, and bottom     Lower Body Dressing: Total assistance Lower Body Dressing Details (indicate cue type and  reason): dependent for donning BIL socks Toilet Transfer: Maximal assistance;+2 for physical assistance;+2 for safety/equipment   Toileting- Clothing Manipulation and Hygiene: Total assistance;Bed level;Sit to/from stand               Vision   Additional Comments: suspect some peripheral vision loss as pt with greater communication with OT in her line of sight. Per chart, wears reading glasses only     Perception         Praxis         Pertinent Vitals/Pain Pain Assessment Pain Assessment: Faces Faces Pain Scale: Hurts even more Pain Location: LLE Pain Descriptors / Indicators: Discomfort, Grimacing Pain Intervention(s): Limited activity within patient's tolerance, Monitored during session     Extremity/Trunk Assessment Upper Extremity Assessment Upper Extremity Assessment: Generalized weakness   Lower Extremity Assessment Lower Extremity Assessment: Defer to PT evaluation   Cervical / Trunk Assessment Cervical / Trunk Assessment: Kyphotic   Communication Communication Communication: Hearing impairment Cueing Techniques: Verbal cues;Gestural cues;Tactile cues   Cognition Arousal: Alert Behavior During Therapy: WFL for tasks assessed/performed Overall Cognitive Status: History of cognitive impairments - at baseline                                 General Comments: able to follow one step commands; needing cues for sequencing bed mobility this session. Hard of hearing affects command following. pt oriented to self, birth date, being in hospital, not time or situation this date. pt with some emergent awareness, on standing stating "I am peeing on myself"     General Comments  LLE wrap and dressing intact.    Exercises     Shoulder Instructions      Home Living Family/patient expects to be discharged to:: Skilled nursing facility                             Home Equipment: Rolling Walker (2 wheels)   Additional Comments: per  chart, daughter lives in the area      Prior Functioning/Environment Prior Level of Function : Needs assist  Cognitive Assist : Mobility (cognitive) Mobility (Cognitive): Intermittent cues   Physical Assist : Mobility (physical);ADLs (physical)     Mobility Comments: Per daughter reports, patient was ambulatory using a 2-wheeled  RW ADLs Comments: per dtr pt  Supervision for bathing. IND with dressing (information gleaned from PT note/chart review)        OT Problem List: Decreased strength;Decreased cognition;Pain;Impaired balance (sitting and/or standing);Decreased activity tolerance      OT Treatment/Interventions: Self-care/ADL training;Cognitive remediation/compensation    OT Goals(Current goals can be found in the care plan section) Acute Rehab OT Goals Patient Stated Goal: decreased pain OT Goal Formulation: Patient unable to participate in goal setting Time For Goal Achievement: 04/30/23 Potential to Achieve Goals: Fair  OT Frequency: Min 1X/week    Co-evaluation              AM-PAC OT "6 Clicks" Daily Activity     Outcome Measure Help from another person eating meals?: A Little Help from another person taking care of personal grooming?: A Little Help from another person toileting, which includes using toliet, bedpan,  or urinal?: A Lot Help from another person bathing (including washing, rinsing, drying)?: A Lot Help from another person to put on and taking off regular upper body clothing?: A Little Help from another person to put on and taking off regular lower body clothing?: A Lot 6 Click Score: 15   End of Session Equipment Utilized During Treatment: Gait belt;Rolling walker (2 wheels) Nurse Communication: Mobility status  Activity Tolerance: Patient tolerated treatment well;Patient limited by pain Patient left: in bed;with call bell/phone within reach;with bed alarm set  OT Visit Diagnosis: Unsteadiness on feet (R26.81);History of falling  (Z91.81);Muscle weakness (generalized) (M62.81);Pain;Other symptoms and signs involving cognitive function Pain - Right/Left: Left Pain - part of body: Ankle and joints of foot                Time: 4098-1191 OT Time Calculation (min): 30 min Charges:  OT General Charges $OT Visit: 1 Visit OT Evaluation $OT Eval Low Complexity: 1 Low OT Treatments $Self Care/Home Management : 8-22 mins  Karilyn Ouch, OTR/L [facility] Acute Rehabilitation Office: [phone]   Laura Wilcox 04/16/2023, 11:01 AM

## 2023-04-16 NOTE — H&P (View-Only) (Signed)
 1 Day Post-Op  Subjective: Patient is a 89 year old female with history of a wound to her left lower extremity.  She recently had a large hematoma evacuated to her left lower extremity and was discharged but returned to the hospital due to increased pain to the left lower extremity wound.  Since hospitalization, patient was noted to have significant PAD in her left lower extremity requiring right SFA stenting.  Plastic surgery was consulted to assist in the management of her wound.  Upon entering the room today, patient is lying in bed in no acute distress.  She is accompanied by her son and daughter at bedside.  They report that patient has been receiving antibiotics since Sunday.  Reports patient has discomfort during dressing changes.  Patient's daughter states that patient did have surgery back in early January and was put to sleep without any difficulties.  Objective: Vital signs in last 24 hours: Temp:  [97.6 F (36.4 C)-98.9 F (37.2 C)] 97.6 F (36.4 C) (01/15 1202) Pulse Rate:  [0-90] 60 (01/15 0802) Resp:  [15-35] 20 (01/15 1202) BP: (87-160)/(50-102) 106/60 (01/15 1202) SpO2:  [69 %-100 %] 89 % (01/15 1202) Last BM Date : 04/13/23  Intake/Output from previous day: 01/14 0701 - 01/15 0700 In: 913.4 [P.O.:120; I.V.:656.5; IV Piggyback:136.9] Out: 650 [Urine:650] Intake/Output this shift: No intake/output data recorded.  General appearance: alert, cooperative, and no distress Resp: Unlabored breathing, no respiratory distress Extremities: Exam somewhat limited due to patient's pain.  Patient has an approximately 6 x 8 cm wound to the lateral aspect of the left lower extremity.  There is some superficial eschar noted to the wound.  Otherwise wound has exudate as long as some healthy appearing tissue throughout.  The skin surrounding the wound appears erythematous and has some tenderness palpation consistent with cellulitis.  Lab Results:     Latest Ref Rng & Units 04/16/2023     9:11 AM 04/15/2023    6:32 AM 04/15/2023    3:13 AM  CBC  WBC 4.0 - 10.5 K/uL 16.1  12.4  13.4   Hemoglobin 12.0 - 15.0 g/dL 8.4  7.5  7.6   Hematocrit 36.0 - 46.0 % 27.8  24.6  25.1   Platelets 150 - 400 K/uL 678  670  635     BMET Recent Labs    04/15/23 0313 04/16/23 0911  NA 137 138  K 4.2 4.8  CL 101 103  CO2 26 22  GLUCOSE 92 77  BUN 19 14  CREATININE 1.10* 1.11*  CALCIUM 9.1 9.0   PT/INR No results for input(s): "LABPROT", "INR" in the last 72 hours. ABG No results for input(s): "PHART", "HCO3" in the last 72 hours.  Invalid input(s): "PCO2", "PO2"  Studies/Results: PERIPHERAL VASCULAR CATHETERIZATION Result Date: 04/15/2023 Images from the original result were not included. Patient name: Laura Wilcox MRN: [id number] DOB: 08/19/1926 Sex: female 04/15/2023 Pre-operative Diagnosis: Left leg ulcer Post-operative diagnosis:  Same Surgeon:  Durene Cal Procedure Performed:  1.  Ultrasound-guided access, right femoral artery  2.  Abdominal aortogram  3.  Left lower extremity angiogram  4.  Selective imaging with cath in the left popliteal artery  5.  Stent, left superficial femoral-popliteal artery  6.  Conscious sedation, 54 minutes  7.  Closure device, Celt Indications: This is a 89 year old female who suffered a fall about 2 weeks ago with a significant left ankle hematoma requiring surgical evacuation creating a large wound.  She has had deterioration of the wound.  ABIs were  suboptimal for healing and so she comes in today for angiography. Procedure:  The patient was identified in the holding area and taken to room 8.  The patient was then placed supine on the table and prepped and draped in the usual sterile fashion.  A time out was called.  Conscious sedation was administered with the use of IV fentanyl and Versed under continuous physician and nurse monitoring.  Heart rate, blood pressure, and oxygen saturation were continuously monitored.  Total sedation time was 54  minutes.  Ultrasound was used to evaluate the right common femoral artery.  It was patent .  A digital ultrasound image was acquired.  A micropuncture needle was used to access the right common femoral artery under ultrasound guidance.  An 018 wire was advanced without resistance and a micropuncture sheath was placed.  The 018 wire was removed and a benson wire was placed.  The micropuncture sheath was exchanged for a 5 french sheath.  An omniflush catheter was advanced over the wire to the level of L-1.  An abdominal angiogram was obtained.  Next, using the omniflush catheter and a benson wire, the aortic bifurcation was crossed and the catheter was placed into theleft external iliac artery and left runoff was obtained.  Findings:  Aortogram: No significant renal stenosis identified.  The infrarenal abdominal aorta is calcified but patent without significant stenosis.  Greater than 50% right common iliac stenosis.  The left common and external iliac and the right extrailiac artery are widely patent.  Right Lower Extremity: The right common femoral and profundofemoral artery are patent throughout the course.  The superficial femoral artery is occluded just beyond its origin.  There is a long segment occlusion with reconstitution of the above-knee popliteal artery and single-vessel runoff via the peroneal artery.  Left Lower Extremity: Not evaluated Intervention: After the above images were acquired the decision was made to proceed with intervention.  A 6 x 45 sheath was inserted into the left external iliac artery.  The patient was fully heparinized.  Next, using a 035 Glidewire and a quick cross catheter, subintimal recanalization was performed.  A contrast injection was performed with the catheter in the above-knee popliteal artery, confirming successful crossing of the lesion.  Next, a 035 versa core wire was placed.  The subintimal track was dilated with a 4 mm balloon and then overlapping 6 mm Eluvia stents  were deployed to treat the entire length of the lesion (6 x 150 x 2, 6 x 40).  The stents were dilated with a 5 mm balloon.  There was a residual waist up near the origin of the stents which was treated again with balloon angioplasty.  Completion imaging showed residual in-stent narrowing in the proximal portion of the stents.  The remaining portion of the stents are widely patent with preserved single-vessel runoff via the peroneal artery.  The long sheath was exchanged out for a short 6 French sheath and a Celt was sutured closure without complication Impression:  #1  Occluded right superficial femoral artery, successfully crossed and treated with primary stenting using 6 mm drug-eluting Eluvia stents.  #2  Single-vessel runoff via the peroneal artery  #3  Will only treat with 81 mg of aspirin and not Plavix given her high risk of bleeding and frailty.  Juleen China, M.D., FACS [facility] Office: [phone] Pager:  [phone]    Anti-infectives: Anti-infectives (From admission, onward)    Start     Dose/Rate Route  Frequency Ordered Stop   04/15/23 1400  vancomycin (VANCOREADY) IVPB 750 mg/150 mL  Status:  Discontinued        750 mg 150 mL/hr over 60 Minutes Intravenous Every 48 hours 04/13/23 1920 04/14/23 1510   04/14/23 1200  cefTRIAXone (ROCEPHIN) 1 g in sodium chloride 0.9 % 100 mL IVPB        1 g 200 mL/hr over 30 Minutes Intravenous Every 24 hours 04/13/23 1914         Assessment/Plan: s/p Procedure(s): ABDOMINAL AORTOGRAM W/LOWER EXTREMITY PERIPHERAL VASCULAR BALLOON ANGIOPLASTY PERIPHERAL VASCULAR INTERVENTION Plan: -Per Dr. Ulice Bold, plan is take patient for debridement of the wound as well as myriad placement.  I discussed this with the son and daughter and they were in agreement with this plan.  -I also discussed the possibility of wound VAC.  Patient's daughter and son expressed understanding. -Recommend placing Vashe soaked gauze to  the wound for 10 minutes/day, remove the gauze and apply Adaptic, K-Y jelly, Vashe soaked gauze and wrapped in Kerlix.  Recommend changing dressings daily. -Medical management and antibiotic management per primary  Pictures were obtained of the patient and placed in the chart with the patient's or guardian's permission.    LOS: 3 days    [name], PA-C 04/16/2023

## 2023-04-17 DIAGNOSIS — Z7189 Other specified counseling: Secondary | ICD-10-CM

## 2023-04-17 DIAGNOSIS — F05 Delirium due to known physiological condition: Secondary | ICD-10-CM

## 2023-04-17 DIAGNOSIS — D7581 Myelofibrosis: Secondary | ICD-10-CM

## 2023-04-17 DIAGNOSIS — I70222 Atherosclerosis of native arteries of extremities with rest pain, left leg: Secondary | ICD-10-CM | POA: Diagnosis not present

## 2023-04-17 DIAGNOSIS — F0390 Unspecified dementia without behavioral disturbance: Secondary | ICD-10-CM

## 2023-04-17 DIAGNOSIS — Z515 Encounter for palliative care: Secondary | ICD-10-CM

## 2023-04-17 DIAGNOSIS — D45 Polycythemia vera: Secondary | ICD-10-CM

## 2023-04-17 DIAGNOSIS — L03116 Cellulitis of left lower limb: Secondary | ICD-10-CM | POA: Diagnosis not present

## 2023-04-17 LAB — CBC
HCT: 25.5 % — ABNORMAL LOW (ref 36.0–46.0)
Hemoglobin: 7.7 g/dL — ABNORMAL LOW (ref 12.0–15.0)
MCH: 25.8 pg — ABNORMAL LOW (ref 26.0–34.0)
MCHC: 30.2 g/dL (ref 30.0–36.0)
MCV: 85.3 fL (ref 80.0–100.0)
Platelets: 715 10*3/uL — ABNORMAL HIGH (ref 150–400)
RBC: 2.99 MIL/uL — ABNORMAL LOW (ref 3.87–5.11)
RDW: 29 % — ABNORMAL HIGH (ref 11.5–15.5)
WBC: 16.3 10*3/uL — ABNORMAL HIGH (ref 4.0–10.5)
nRBC: 0.6 % — ABNORMAL HIGH (ref 0.0–0.2)

## 2023-04-17 NOTE — Consult Note (Signed)
[facility] Liaison Consult Note   04/17/2023  Cris Holverson May 11, 1926 [id number]  Insurance:  Medicare ACO REACH   Primary Care Provider: Smiley Houseman, NP is listed in electronic medical record with [facility], [facility] which is NOT an VBCI affiliated provider   [facility] Liaison reviewed due to risk and patient for unplanned readmission review  The patient was screened for 7 and 30 day readmission hospitalization with noted high risk score for unplanned readmission risk 2 ED visits and 2 hospital admissions in 6 months.  Noted was transferred from [facility] to [facility] hospital as well.  The patient was assessed for potential Community Care Coordination service needs for post hospital transition for care coordination. Review of patient's electronic medical record reveals patient is currently from [facility] in [place] per inpatient [facility] LCSW notes.  Patient is currently recommended for a ST SNF for rehab prior to returning to Memory Care noted.  Plan: [facility] Liaison will continue to follow progress and disposition to asess for post hospital community care coordination needs.   Referral request for community care coordination: no current community care follow up needs assessed or anticipated, as patient's ultimate disposition is for memory care level of care.   VBCI Community Care, Population Health does not replace or interfere with any arrangements made by the Inpatient Transition of Care team.   For questions contact:   Charlesetta Shanks, RN, BSN, CCM [place]  [facility], [facility] Liaison Direct Dial: [phone] or secure chat Email: [email or web address]

## 2023-04-17 NOTE — Plan of Care (Signed)
  Problem: Clinical Measurements: Goal: Will remain free from infection Outcome: Progressing   Problem: Clinical Measurements: Goal: Respiratory complications will improve Outcome: Progressing   Problem: Activity: Goal: Risk for activity intolerance will decrease Outcome: Progressing   

## 2023-04-17 NOTE — Plan of Care (Signed)
  Problem: Pain Management: Goal: General experience of comfort will improve Outcome: Progressing

## 2023-04-17 NOTE — TOC Progression Note (Signed)
Transition of Care [facility]) - Progression Note    Patient Details  Name: Laura Wilcox MRN: [id number] Date of Birth: Sep 27, 1926  Transition of [facility]) CM/SW Contact  Eduard Roux, [place] Phone Number: 04/17/2023, 4:10 PM  Clinical Narrative:     Received call form patient's daughter- she requested to send SNF referral to [facility] and [facility] in [place]  CSW reminder family member of the importance of securing a bed before the patient is stable for d/c. Family states understanding.   TOC will continue to follow and assist with discharge planning.   Antony Blackbird, MSW, LCSW Clinical Social Worker    Expected Discharge Plan: Skilled Nursing Facility Barriers to Discharge: Continued Medical Work up, SNF Pending bed offer  Expected Discharge Plan and Services In-house Referral: Clinical Social Work     Living arrangements for the past 2 months: Assisted Living Facility                                       Social Determinants of Health (SDOH) Interventions SDOH Screenings   Food Insecurity: No Food Insecurity (04/13/2023)  Housing: Low Risk  (04/13/2023)  Transportation Needs: No Transportation Needs (04/13/2023)  Utilities: Not At Risk (04/13/2023)  Alcohol Screen: Low Risk  (06/18/2021)  Depression (PHQ2-9): High Risk (06/18/2021)  Social Connections: Unknown (04/13/2023)  Tobacco Use: Low Risk  (04/13/2023)    Readmission Risk Interventions    06/26/2021    4:26 PM  Readmission Risk Prevention Plan  Transportation Screening Complete  PCP or Specialist Appt within 5-7 Days Complete  Home Care Screening Complete  Medication Review (RN CM) Complete

## 2023-04-17 NOTE — Progress Notes (Signed)
Physical Therapy Treatment Patient Details Name: Laura Wilcox MRN: [id number] DOB: Nov 01, 1926 Today's Date: 04/17/2023   History of Present Illness 89 y.o. female sent in from her memory care unit 1/12 secondary to pain in L foot/ankle. Vascular consulted and recommended transfer to [facility] for lower extremity angiogram. Now s/p LLE angiogram and stent L superficial popliteal artery 1/14. PMH: advanced dementia, polycythemia vera, chronic myelofibrosis on oral chemotherapy, L ankle hematoma and laceration/fall 04/2023, s/p I&D L ankle 04/02/2023 at [facility]    PT Comments  Pt is slowly progressing towards goals. Pt was Min A for supine to sitting today and Mod A for sitting to supine. Pt was Mod A +2 for 2x sit to stand from EOB. Pt continues to have difficulty WB through LLE due to pain in the L foot from wound. Pt was unable to wgt shift to attempt side stepping. Due to pt current functional status, home set up and available assistance at home recommending skilled physical therapy services < 3 hours/day in order to address strength, balance and functional mobility to decrease risk for falls, injury, immobility, skin break down and re-hospitalization.      If plan is discharge home, recommend the following: A lot of help with walking and/or transfers;Assistance with cooking/housework;Assist for transportation;Help with stairs or ramp for entrance;Supervision due to cognitive status   Can travel by private vehicle     No  Equipment Recommendations  None recommended by PT       Precautions / Restrictions Precautions Precautions: Fall Restrictions Weight Bearing Restrictions Per Provider Order: No     Mobility  Bed Mobility Overal bed mobility: Needs Assistance Bed Mobility: Supine to Sit, Sit to Supine     Supine to sit: Min assist, HOB elevated, Used rails Sit to supine: Mod assist   General bed mobility comments: Min A to help scoot to EOB for supine to sitting and Mod A for LE  and cueing for sequencing for sitting to supine.    Transfers Overall transfer level: Needs assistance Equipment used: Rolling walker (2 wheels) Transfers: Sit to/from Stand Sit to Stand: Mod assist, +2 physical assistance, +2 safety/equipment           General transfer comment: Pt was able to WB minimally through LLE Mod A of 2 for 2x sit to stand.    Ambulation/Gait     Pre-gait activities: Attempted side stepping at EOB with facilitation for wgt shifting. Pt was unable to shift wgt off the LLE for side step to progress foot.       Balance Overall balance assessment: Needs assistance Sitting-balance support: Feet supported, Bilateral upper extremity supported, Single extremity supported Sitting balance-Leahy Scale: Fair Sitting balance - Comments: able to sit at EOB with supervision   Standing balance support: Bilateral upper extremity supported, During functional activity, Reliant on assistive device for balance Standing balance-Leahy Scale: Zero Standing balance comment: Reliant on external support for standing.          Cognition Arousal: Alert Behavior During Therapy: WFL for tasks assessed/performed Overall Cognitive Status: History of cognitive impairments - at baseline     General Comments: Able to follow one step commands. Cueing for sequencing           General Comments General comments (skin integrity, edema, etc.): LLE wrapped and dressing intact with no drainage noted.      Pertinent Vitals/Pain Pain Assessment Pain Assessment: Faces Faces Pain Scale: Hurts little more Breathing: occasional labored breathing, short period of hyperventilation Negative  Vocalization: occasional moan/groan, low speech, negative/disapproving quality Facial Expression: sad, frightened, frown Body Language: tense, distressed pacing, fidgeting Consolability: no need to console PAINAD Score: 4 Pain Location: LLE Pain Descriptors / Indicators: Discomfort, Grimacing,  Guarding Pain Intervention(s): Limited activity within patient's tolerance, Monitored during session     PT Goals (current goals can now be found in the care plan section) Acute Rehab PT Goals Patient Stated Goal: eventually to memory care PT Goal Formulation: With family Time For Goal Achievement: 04/28/23 Potential to Achieve Goals: Good Progress towards PT goals: Progressing toward goals    Frequency    Min 1X/week      PT Plan  Continue with current POC        AM-PAC PT "6 Clicks" Mobility   Outcome Measure  Help needed turning from your back to your side while in a flat bed without using bedrails?: A Little Help needed moving from lying on your back to sitting on the side of a flat bed without using bedrails?: A Little Help needed moving to and from a bed to a chair (including a wheelchair)?: A Lot Help needed standing up from a chair using your arms (e.g., wheelchair or bedside chair)?: A Lot Help needed to walk in hospital room?: Total Help needed climbing 3-5 steps with a railing? : Total 6 Click Score: 12    End of Session Equipment Utilized During Treatment: Gait belt Activity Tolerance: Patient tolerated treatment well;Patient limited by pain Patient left: in bed;with call bell/phone within reach;with bed alarm set Nurse Communication: Mobility status PT Visit Diagnosis: Other abnormalities of gait and mobility (R26.89);Muscle weakness (generalized) (M62.81);Difficulty in walking, not elsewhere classified (R26.2);Unsteadiness on feet (R26.81)     Time: 8469-6295 PT Time Calculation (min) (ACUTE ONLY): 18 min  Charges:    $Therapeutic Activity: 8-22 mins PT General Charges $$ ACUTE PT VISIT: 1 Visit          Harrel Carina, DPT, CLT  Acute Rehabilitation Services Office: [phone] (Secure chat preferred)    Claudia Desanctis 04/17/2023, 4:16 PM

## 2023-04-17 NOTE — Progress Notes (Signed)
PROGRESS NOTE    Laura Wilcox  JYN:[id number] DOB: [birth year] DOA: 04/13/2023 PCP: Smiley Houseman, NP    Brief Narrative:   Laura Wilcox is a 89 y.o. female with past medical history significant for advanced dementia, polycythemia vera, chronic myelofibrosis on oral chemotherapy, hypothyroidism who presented from her memory care unit to [facility] ED on 1/12 with severe pain to her left foot.  Patient unable to complete dressing changes at SNF due to pain.  Recently discharged from hospital 2 days prior with basic wound care after a large hematoma was evacuated.  Since discharge, staff unable to change bandages due to her symptoms.  Patient has pending outpatient appointment with plastic surgery, Dr. Hester Mates on 04/15/2023.  In the ED, temperature 97.8 F, HR 81, RR 18, BP 163/72, SpO2 93% on room air.  WBC 12.7, hemoglobin 7.5, platelet count 595.  Sodium 137, potassium 4.3, chloride 102, CO2 23, glucose 88, BUN 24, Cram 1.04.  Left foot x-ray with no evidence for acute fracture, no subluxation or dislocation.  TRH consulted for admission.  Assessment & Plan:   Left lower extremity nonhealing wound with cellulitis PAD s/p right SFA stenting Patient presenting to ED with severe pain during dressing changes at SNF.  Recently admitted with large hematoma that was evacuated to the left ankle region.  Was to see plastic surgery outpatient on 04/15/2023 but given poor healing and worsening pain sought further care back in the ED.  Patient's left foot cool to touch and recent ABI showing severe PAD.  Vascular surgery was consulted and patient underwent angiogram with stenting of left SFA by Dr. Myra Gianotti on 04/15/2023.  -- Vascular surgery following, appreciate assistance -- WBC 12.4>12.7>13.4>12.4>16.1>16.3 -- LDL 100, HDL 37 -- Ceftriaxone 1 g IV every 24 hours -- Tramadol 50 mg p.o. every 6 hours as needed moderate pain -- Aspirin 81 mg p.o. daily -- Statin discontinued per request of son --  Plastic surgery following, plan wound debridement and possible wound VAC placement on 1/20 -- CBC daily  Advanced dementia Patient currently resides in memory care unit.Marland Kitchen -- Delirium precautions -- Get up during the day -- Encourage a familiar face to remain present throughout the day -- Keep blinds open and lights on during daylight hours -- Minimize the use of opioids/benzodiazepines -- Melatonin 3 mg p.o. nightly -- TOC consulted  Myelofibrosis -- Hold home Jakafi given active infection -- Outpatient follow-up with medical oncology  Hypothyroidism -- Levothyroxine 25 mcg p.o. daily  Anxiety/depression -- Celexa 10 mg p.o. daily  Normocytic anemia Anemia of chronic medical disease Anemia panel with iron 16, TIBC low at 231, ferritin 446, B12 1559 and folate greater than 40. -- Hgb 9.0>7.5>7.6>7.5>8.4>7.7 -- Transfuse for hemoglobin less than 7.0 -- CBC daily  Weakness/debility/deconditioning Prior functional status, ambulatory with a walker. -- PT/OT recommend SNF placement -- Continue therapy efforts while inpatient -- TOC consulted for placement   DVT prophylaxis: heparin injection 5,000 Units Start: 04/13/23 2200    Code Status: Limited: Do not attempt resuscitation (DNR) -DNR-LIMITED -Do Not Intubate/DNI  Family Communication: Updated patient's daughter and son present at bedside this morning  Disposition Plan:  Level of care: Med-Surg Status is: Inpatient Remains inpatient appropriate because: Pending plastic surgery debridements of leg wound plan for 1/20, pending vascular surgery signed off and SNF placement    Consultants:  Vascular surgery Plastic surgery  Procedures:  Angiogram: Pending  Antimicrobials:  Ceftriaxone 1/12>> Vancomycin 1/12 - 1/12   Subjective: Patient seen examined bedside, resting calmly.  Lying in bed.  Patient's daughter and son present at bedside.  Son requesting to DC statin given "a 37 year old does not need to be on this  medication and concerned about side effects".  Discussed with them extensively that his mom required a stent for peripheral vascular disease for critical limb ischemia; he states "understands" but still wants this medication removed.  Reviewed extensively all medications his mother is currently on and agrees to remainder that are in place.  Plastic surgery plans wound debridement on Monday.  Remains pleasantly confused; but reports pain to left leg improved.  No acute events overnight per nursing staff.  Objective: Vitals:   04/16/23 2003 04/16/23 2312 04/17/23 0300 04/17/23 0737  BP: (!) 118/54 121/60 (!) 122/57 (!) 121/55  Pulse: 100 90 89 88  Resp: 20 20 20 19   Temp: 98.5 F (36.9 C) 98.2 F (36.8 C) 98 F (36.7 C) 98.3 F (36.8 C)  TempSrc: Oral Oral Oral Axillary  SpO2: 91% 91% 92% 90%  Weight:      Height:        Intake/Output Summary (Last 24 hours) at 04/17/2023 1228 Last data filed at 04/17/2023 0300 Gross per 24 hour  Intake 220 ml  Output 200 ml  Net 20 ml   Filed Weights   04/13/23 1826  Weight: 52.8 kg    Examination:  Physical Exam: GEN: NAD, alert, pleasantly confused, chronically ill/elderly in appearance HEENT: NCAT, PERRL, EOMI, sclera clear PULM: CTAB w/o wheezes/crackles, normal respiratory effort, on room air with SpO2 98% at rest. CV: RRR w/o M/G/R GI: abd soft, NTND, NABS, no R/G/M MSK: no peripheral edema, moves all extremities independently, left lower extremity with wound depicted as below with several areas of ecchymosis in various stages of healing to extremities NEURO: No focal neurologic deficit PSYCH: Euthymic Integumentary: left lower extremity with wound depicted as below with several areas of ecchymosis in various stages of healing to extremities    Data Reviewed: I have personally reviewed following labs and imaging studies  CBC: Recent Labs  Lab 04/13/23 0930 04/14/23 0327 04/15/23 0313 04/15/23 0632 04/16/23 0911 04/17/23 0312   WBC 12.4* 12.7* 13.4* 12.4* 16.1* 16.3*  NEUTROABS 9.0*  --   --   --   --   --   HGB 9.0* 7.5* 7.6* 7.5* 8.4* 7.7*  HCT 30.0* 24.9* 25.1* 24.6* 27.8* 25.5*  MCV 85.0 86.8 85.1 84.8 87.1 85.3  PLT 744* 595* 635* 670* 678* 715*   Basic Metabolic Panel: Recent Labs  Lab 04/13/23 0930 04/14/23 0327 04/15/23 0313 04/16/23 0911  NA 137 137 137 138  K 4.3 4.3 4.2 4.8  CL 98 102 101 103  CO2 24 23 26 22   GLUCOSE 146* 88 92 77  BUN 28* 24* 19 14  CREATININE 1.39* 1.04* 1.10* 1.11*  CALCIUM 9.7 8.8* 9.1 9.0  MG  --   --   --  2.0  PHOS  --   --   --  4.4   GFR: Estimated Creatinine Clearance: 22.3 mL/min (A) (by C-G formula based on SCr of 1.11 mg/dL (H)). Liver Function Tests: No results for input(s): "AST", "ALT", "ALKPHOS", "BILITOT", "PROT", "ALBUMIN" in the last 168 hours. No results for input(s): "LIPASE", "AMYLASE" in the last 168 hours. No results for input(s): "AMMONIA" in the last 168 hours. Coagulation Profile: No results for input(s): "INR", "PROTIME" in the last 168 hours. Cardiac Enzymes: No results for input(s): "CKTOTAL", "CKMB", "CKMBINDEX", "TROPONINI" in the last 168 hours. BNP (last  3 results) No results for input(s): "PROBNP" in the last 8760 hours. HbA1C: No results for input(s): "HGBA1C" in the last 72 hours. CBG: Recent Labs  Lab 04/15/23 1603  GLUCAP 74   Lipid Profile: Recent Labs    04/16/23 0911  CHOL 157  HDL 37*  LDLCALC 100*  TRIG 101  CHOLHDL 4.2   Thyroid Function Tests: No results for input(s): "TSH", "T4TOTAL", "FREET4", "T3FREE", "THYROIDAB" in the last 72 hours. Anemia Panel: Recent Labs    04/16/23 0911  VITAMINB12 1,559*  FOLATE >40.0  FERRITIN 446*  TIBC 231*  IRON 16*  RETICCTPCT 4.0*   Sepsis Labs: No results for input(s): "PROCALCITON", "LATICACIDVEN" in the last 168 hours.  No results found for this or any previous visit (from the past 240 hours).       Radiology Studies: PERIPHERAL VASCULAR  CATHETERIZATION Result Date: 04/15/2023 Images from the original result were not included. Patient name: Laura Wilcox MRN: [id number] DOB: [birth year] Sex: female 04/15/2023 Pre-operative Diagnosis: Left leg ulcer Post-operative diagnosis:  Same Surgeon:  Durene Cal Procedure Performed:  1.  Ultrasound-guided access, right femoral artery  2.  Abdominal aortogram  3.  Left lower extremity angiogram  4.  Selective imaging with cath in the left popliteal artery  5.  Stent, left superficial femoral-popliteal artery  6.  Conscious sedation, 54 minutes  7.  Closure device, Celt Indications: This is a 89 year old female who suffered a fall about 2 weeks ago with a significant left ankle hematoma requiring surgical evacuation creating a large wound.  She has had deterioration of the wound.  ABIs were suboptimal for healing and so she comes in today for angiography. Procedure:  The patient was identified in the holding area and taken to room 8.  The patient was then placed supine on the table and prepped and draped in the usual sterile fashion.  A time out was called.  Conscious sedation was administered with the use of IV fentanyl and Versed under continuous physician and nurse monitoring.  Heart rate, blood pressure, and oxygen saturation were continuously monitored.  Total sedation time was 54 minutes.  Ultrasound was used to evaluate the right common femoral artery.  It was patent .  A digital ultrasound image was acquired.  A micropuncture needle was used to access the right common femoral artery under ultrasound guidance.  An 018 wire was advanced without resistance and a micropuncture sheath was placed.  The 018 wire was removed and a benson wire was placed.  The micropuncture sheath was exchanged for a 5 french sheath.  An omniflush catheter was advanced over the wire to the level of L-1.  An abdominal angiogram was obtained.  Next, using the omniflush catheter and a benson wire, the aortic bifurcation was  crossed and the catheter was placed into theleft external iliac artery and left runoff was obtained.  Findings:  Aortogram: No significant renal stenosis identified.  The infrarenal abdominal aorta is calcified but patent without significant stenosis.  Greater than 50% right common iliac stenosis.  The left common and external iliac and the right extrailiac artery are widely patent.  Right Lower Extremity: The right common femoral and profundofemoral artery are patent throughout the course.  The superficial femoral artery is occluded just beyond its origin.  There is a long segment occlusion with reconstitution of the above-knee popliteal artery and single-vessel runoff via the peroneal artery.  Left Lower Extremity: Not evaluated Intervention: After the above images were acquired the decision was made to  proceed with intervention.  A 6 x 45 sheath was inserted into the left external iliac artery.  The patient was fully heparinized.  Next, using a 035 Glidewire and a quick cross catheter, subintimal recanalization was performed.  A contrast injection was performed with the catheter in the above-knee popliteal artery, confirming successful crossing of the lesion.  Next, a 035 versa core wire was placed.  The subintimal track was dilated with a 4 mm balloon and then overlapping 6 mm Eluvia stents were deployed to treat the entire length of the lesion (6 x 150 x 2, 6 x 40).  The stents were dilated with a 5 mm balloon.  There was a residual waist up near the origin of the stents which was treated again with balloon angioplasty.  Completion imaging showed residual in-stent narrowing in the proximal portion of the stents.  The remaining portion of the stents are widely patent with preserved single-vessel runoff via the peroneal artery.  The long sheath was exchanged out for a short 6 French sheath and a Celt was sutured closure without complication Impression:  #1  Occluded right superficial femoral artery, successfully  crossed and treated with primary stenting using 6 mm drug-eluting Eluvia stents.  #2  Single-vessel runoff via the peroneal artery  #3  Will only treat with 81 mg of aspirin and not Plavix given her high risk of bleeding and frailty.  Juleen China, M.D., FACS [facility] Office: [phone] Pager:  [phone]        Scheduled Meds:  acetaminophen  500 mg Oral Q6H   aspirin EC  81 mg Oral Daily   calcitRIOL  0.25 mcg Oral Q M,W,F   cholecalciferol  1,000 Units Oral Daily   citalopram  10 mg Oral Daily   cyanocobalamin  1,000 mcg Oral Daily   feeding supplement  237 mL Oral BID BM   heparin  5,000 Units Subcutaneous Q8H   levothyroxine  25 mcg Oral Q0600   melatonin  3 mg Oral QHS   multivitamin with minerals  1 tablet Oral Daily   sodium chloride flush  3 mL Intravenous Q12H   Continuous Infusions:  cefTRIAXone (ROCEPHIN)  IV 1 g (04/17/23 1208)     LOS: 4 days    Time spent: 52 minutes spent on chart review, discussion with nursing staff, consultants, updating family and interview/physical exam; more than 50% of that time was spent in counseling and/or coordination of care.    Alvira Philips [place], DO [facility] Available via Epic secure chat 7am-7pm After these hours, please refer to coverage provider listed on [email or web address] 04/17/2023, 12:28 PM

## 2023-04-17 NOTE — Consult Note (Signed)
Palliative Care Consult Note                                  Date: 04/17/2023   Patient Name: Laura Wilcox  DOB: [birth year]  MRN: [id number]  Age / Sex: 89 y.o., female  PCP: Smiley Houseman, NP Referring Physician: [place], Eric J, DO  Reason for Consultation: Establishing goals of care  HPI/Patient Profile: 89 y.o. female  with past medical history of advanced dementia, polycythemia vera, chronic myelofibrosis on oral chemotherapy, and hypothyroidism who presented from her memory care unit to [facility] ED on 04/13/2023 with severe pain to her left foot.  She was recently hospitalized at [facility] 03/31/2023 with a large ankle hematoma; status post I&D on 04/02/23.  She was discharged 04/11/2023, but has been unable to complete dressing changes at her facility due to pain.   On admission, patient's left foot cool to touch and recent ABI showing severe PAD.  Vascular surgery was consulted and patient underwent angiogram with stenting of left SFA on 04/15/2023.  Palliative medicine was consulted for goals of care and medical decision making in the setting of "continues with pain and overall prognosis appears poor".    Subjective:   Extensive chart review has been completed prior to meeting with patient/family including labs, vital signs, imaging, progress/consult notes, orders, medications and available advance directive documents.  Patient assessed at bedside.  She is currently sleeping and appears comfortable, so I did not attempt to wake her.  I met with daughter/Tricia and son/Kirby to discuss diagnosis, prognosis, GOC, disposition, and options.  Patient is currently enrolled in [facility] outpatient based palliative care, with last visit in June 2024. I re-introduced Palliative Medicine as specialized medical care for people living with serious illness.   Created space and opportunity for family to express thoughts and feelings regarding current  medical situation. Values and goals of care were attempted to be elicited.  Life Review: Patient is originally from [place] but spent many years living in [place].  She is widowed.  She has 1 daughter/Tricia and 1 son/Kirby.  She and [name] relocated to [place] about 5 years ago.  Functional Status: Patient has resided at [facility] memory care unit in [place] for almost 2 years.  Bethann Berkshire reports that her mother has thrived at [facility], likely due to the social aspect.  At baseline, she is ambulatory with a walker.  She requires assistance with ADLs.  She feeds herself with some prompting, although has decreased appetite.    At baseline she was also able to get in and out of a car with some assistance, and enjoyed outings with her daughter to restaurants or shopping.  GOC Discussion: We discussed patient's current illness and what it means in the larger context of her ongoing co-morbidities.  We reviewed her hospital course as well as current clinical status.   We reviewed her acute issues of left lower extremity nonhealing wound with cellulitis in the setting of PAD.  Reviewed that per plastic surgery, plan is for debridement of the wound and myriad placement in the OR on Monday 1/20.  We also reviewed that dementia is a progressive, non-curable disease underlying the patient's current acute medical conditions.  We reviewed the natural trajectory of dementia includes decreased ability to communicate, ambulate, swallow, and maintain continence.   I shared my concern that given her multiple comorbidities and underlying dementia, it is unlikely patient will return to her  previous baseline after this hospitalization.   Discussed that current recommendation from PT/OT is for short-term rehab.  Family has some reservations about rehab due to previous negative experience.However, they do want to allow patient to have the opportunity for improved strength and mobility, and are open to rehab  depending on the facility. They are hopeful that patient can ultimately transition back to her memory care unit, as they have been very happy with the care and staff there.  We discussed different paths of care with regard to treatment preferences - full scope versus limited interventions (treat the treatable) versus comfort care.  For now, family wants to continue to "treat the treatable". I encouraged family to consider at what point it may be appropriate to transition to a comfort focused path, keeping in mind the concept of quality of life.   Family is open to ongoing GOC discussion, and will make additional decisions as they arise pending patient's clinical course. "Hard choices" book was given. Questions and concerns addressed.       Review of Systems  Unable to perform ROS   Objective:   Primary Diagnoses: Present on Admission:  Cellulitis  Chronic kidney disease, stage 3b (HCC)  Hematoma of left ankle  Polycythemia rubra vera (HCC)  Senile dementia with acute confusional state, without behavioral disturbance (HCC)  Myelofibrosis (HCC)   Physical Exam Vitals reviewed.  Constitutional:      General: She is sleeping. She is not in acute distress.    Comments: Frail and chronically ill-appearing  Pulmonary:     Effort: Pulmonary effort is normal.  Psychiatric:        Cognition and Memory: Cognition is impaired. Memory is impaired.    Palliative Assessment/Data: PPS 30-40%     Assessment & Plan:   SUMMARY OF RECOMMENDATIONS   Continue current scope of care - " treat the treatable" Family is open to short-term rehab depending on the facility, with hope that patient can ultimately return to her memory care unit PMT will continue to follow  Primary Decision Maker: NEXT OF KIN - daughter and son  Code Status/Advance Care Planning: DNR - Limited  Symptom Management:  Per attending Consider low-dose IV fentanyl prior to PT sessions  Prognosis:  Unable to  determine  Discharge Planning:  To Be Determined    Thank you for allowing [place] to participate in the care of Rand Gowan  Time Total: 80 minutes  Detailed review of medical records (labs, imaging, vital signs), medically appropriate exam, discussed with treatment team, counseling and education to family, & staff, documenting clinical information, medication management, coordination of care.   Signed by: Sherlean Foot, NP Palliative Medicine Team  Team Phone # [phone]  For individual providers, please see AMION

## 2023-04-18 DIAGNOSIS — S81802A Unspecified open wound, left lower leg, initial encounter: Secondary | ICD-10-CM | POA: Diagnosis not present

## 2023-04-18 DIAGNOSIS — I70222 Atherosclerosis of native arteries of extremities with rest pain, left leg: Secondary | ICD-10-CM | POA: Diagnosis not present

## 2023-04-18 MED ORDER — MELATONIN 3 MG PO TABS
6.0000 mg | ORAL_TABLET | Freq: Every day | ORAL | Status: DC
  Administered 2023-04-18 – 2023-04-29 (×11): 6 mg via ORAL
  Filled 2023-04-18 (×11): qty 2

## 2023-04-18 MED ORDER — FENTANYL CITRATE PF 50 MCG/ML IJ SOSY
12.5000 ug | PREFILLED_SYRINGE | INTRAMUSCULAR | Status: DC | PRN
  Administered 2023-04-18: 12.5 ug via INTRAVENOUS
  Administered 2023-04-18: 25 ug via INTRAVENOUS
  Administered 2023-04-19: 12.5 ug via INTRAVENOUS
  Administered 2023-04-21 – 2023-04-22 (×5): 25 ug via INTRAVENOUS
  Filled 2023-04-18 (×8): qty 1

## 2023-04-18 NOTE — Plan of Care (Signed)
  Problem: Clinical Measurements: Goal: Ability to maintain clinical measurements within normal limits will improve Outcome: Progressing Goal: Cardiovascular complication will be avoided Outcome: Progressing   Problem: Nutrition: Goal: Adequate nutrition will be maintained Outcome: Progressing   Problem: Coping: Goal: Level of anxiety will decrease Outcome: Progressing   Problem: Elimination: Goal: Will not experience complications related to urinary retention Outcome: Progressing   Problem: Pain Management: Goal: General experience of comfort will improve Outcome: Progressing

## 2023-04-18 NOTE — Progress Notes (Signed)
She remained confused throughout the day, re-orientation done.   Patient's family member was in bed side, encouraged her to eat and drink.   Pain management done as per her pain level. Left lower leg wound dressing done as per the order, pain meds given given before dressing, patient was comfortable throughout the procedure.

## 2023-04-18 NOTE — Progress Notes (Signed)
Physical Therapy Treatment Patient Details Name: Laura Wilcox MRN: [id number] DOB: [birth year] Today's Date: 04/18/2023   History of Present Illness 89 y.o. female sent in from her memory care unit 1/12 secondary to pain in L foot/ankle. Vascular consulted and recommended transfer to [facility] for lower extremity angiogram. Now s/p LLE angiogram and stent L superficial popliteal artery 1/14. PMH: advanced dementia, polycythemia vera, chronic myelofibrosis on oral chemotherapy, L ankle hematoma and laceration/fall 04/2023, s/p I&D L ankle 04/02/2023 at [facility]    PT Comments  Patient finished on bedpan upon PT entry and assisted NT for hygiene.  Patient also medicated just prior to mobility.  She was able to stand with +1 mod A from higher surface though increased time for anterior weight shift and difficulty stepping to recliner.  Second stand from recliner with +2 A pt able to step forward a couple steps prior to stating too much pain in L foot.  Son present throughout and in agreement pt will need inpatient rehab (<3 hours/day) prior to return to her memory care facility.  PT will follow.     If plan is discharge home, recommend the following: Two people to help with walking and/or transfers;Help with stairs or ramp for entrance;Assist for transportation;Assistance with cooking/housework;A lot of help with bathing/dressing/bathroom   Can travel by private vehicle     No  Equipment Recommendations  None recommended by PT    Recommendations for Other Services       Precautions / Restrictions Precautions Precautions: Fall Precaution Comments: L foot wrapped, though no weight limitations     Mobility  Bed Mobility Overal bed mobility: Needs Assistance Bed Mobility: Supine to Sit     Supine to sit: Contact guard, Used rails, Min assist     General bed mobility comments: assist for scooting and pt using rail from flat bed    Transfers Overall transfer level: Needs  assistance Equipment used: Rolling walker (2 wheels) Transfers: Sit to/from Stand Sit to Stand: From elevated surface, Mod assist, +2 physical assistance           General transfer comment: stood from EOB to RW with bed higher with mod A of 1; increased time for anterior weight shift and pt c/o incontinent of urine; though cleaned up from bed pad use just prior to session; stood from low recliner with +2 mod A to RW with improved anterior weight shift    Ambulation/Gait Ambulation/Gait assistance: Mod assist, +2 safety/equipment Gait Distance (Feet): 2 Feet Assistive device: Rolling walker (2 wheels) Gait Pattern/deviations: Step-to pattern, Antalgic, Trunk flexed, Decreased stance time - left, Decreased step length - right       General Gait Details: very short shuffling steps forward x 2-3 then pt reported unable to do any more due to L foot pain so backward steps with A then lowered into chair   Stairs             Wheelchair Mobility     Tilt Bed    Modified Rankin (Stroke Patients Only)       Balance Overall balance assessment: Needs assistance   Sitting balance-Leahy Scale: Fair     Standing balance support: Bilateral upper extremity supported Standing balance-Leahy Scale: Poor Standing balance comment: leaning back initially with max A for coming foward, once in standing improved to min to mod A while applying sacral foam, purewick and pillow in chair  Cognition Arousal: Alert Behavior During Therapy: Anxious Overall Cognitive Status: History of cognitive impairments - at baseline                                 General Comments: follows commands well, focused attention distracted asking frequently about "the children" and wanting to go home        Exercises      General Comments General comments (skin integrity, edema, etc.): son present throughout and discussed likely could not return to  memory care right away      Pertinent Vitals/Pain Pain Assessment Faces Pain Scale: Hurts even more Pain Location: with movement or weight L foot Pain Descriptors / Indicators: Discomfort, Grimacing, Guarding Pain Intervention(s): Monitored during session, Premedicated before session, Repositioned    Home Living                          Prior Function            PT Goals (current goals can now be found in the care plan section) Progress towards PT goals: Progressing toward goals    Frequency    Min 1X/week      PT Plan      Co-evaluation              AM-PAC PT "6 Clicks" Mobility   Outcome Measure  Help needed turning from your back to your side while in a flat bed without using bedrails?: A Little Help needed moving from lying on your back to sitting on the side of a flat bed without using bedrails?: A Little Help needed moving to and from a bed to a chair (including a wheelchair)?: Total Help needed standing up from a chair using your arms (e.g., wheelchair or bedside chair)?: Total Help needed to walk in hospital room?: Total Help needed climbing 3-5 steps with a railing? : Total 6 Click Score: 10    End of Session Equipment Utilized During Treatment: Gait belt Activity Tolerance: Patient limited by pain Patient left: in chair;with family/visitor present;with call bell/phone within reach;with chair alarm set   PT Visit Diagnosis: Other abnormalities of gait and mobility (R26.89);Muscle weakness (generalized) (M62.81);Difficulty in walking, not elsewhere classified (R26.2)     Time: 1610-9604 PT Time Calculation (min) (ACUTE ONLY): 42 min  Charges:    $Therapeutic Activity: 38-52 mins PT General Charges $$ ACUTE PT VISIT: 1 Visit                     Laura Wilcox, PT Acute Rehabilitation Services [phone] 04/18/2023    Laura Wilcox 04/18/2023, 2:43 PM

## 2023-04-18 NOTE — Progress Notes (Signed)
PROGRESS NOTE    Laura Wilcox  ION:[id number] DOB: 1926-07-07 DOA: 04/13/2023 PCP: Smiley Houseman, NP    Brief Narrative:   Laura Wilcox is a 89 y.o. female with past medical history significant for advanced dementia, polycythemia vera, chronic myelofibrosis on oral chemotherapy, hypothyroidism who presented from her memory care unit to [facility] ED on 1/12 with severe pain to her left foot.  Patient unable to complete dressing changes at SNF due to pain.  Recently discharged from hospital 2 days prior with basic wound care after a large hematoma was evacuated.  Since discharge, staff unable to change bandages due to her symptoms.  Patient has pending outpatient appointment with plastic surgery, Dr. Hester Mates on 04/15/2023.  In the ED, temperature 97.8 F, HR 81, RR 18, BP 163/72, SpO2 93% on room air.  WBC 12.7, hemoglobin 7.5, platelet count 595.  Sodium 137, potassium 4.3, chloride 102, CO2 23, glucose 88, BUN 24, Cram 1.04.  Left foot x-ray with no evidence for acute fracture, no subluxation or dislocation.  TRH consulted for admission.  Assessment & Plan:   Left lower extremity nonhealing wound with cellulitis PAD s/p right SFA stenting Patient presenting to ED with severe pain during dressing changes at SNF.  Recently admitted with large hematoma that was evacuated to the left ankle region.  Was to see plastic surgery outpatient on 04/15/2023 but given poor healing and worsening pain sought further care back in the ED.  Patient's left foot cool to touch and recent ABI showing severe PAD.  Vascular surgery was consulted and patient underwent angiogram with stenting of left SFA by Dr. Myra Gianotti on 04/15/2023.  -- Vascular surgery following, appreciate assistance -- WBC 12.4>12.7>13.4>12.4>16.1>16.3 -- LDL 100, HDL 37 -- Ceftriaxone 1 g IV every 24 hours -- Tramadol 50 mg p.o. every 6 hours as needed moderate pain -- Fentanyl 12.5-25 mcg IV every 3 hours as needed severe pain, premedicate  prior to PT/OT session -- Aspirin 81 mg p.o. daily -- Statin discontinued per request of son -- Plastic surgery following, plan wound debridement and possible wound VAC placement on 1/20 -- CBC daily  Advanced dementia Patient currently resides in memory care unit.Marland Kitchen -- Delirium precautions -- Get up during the day -- Encourage a familiar face to remain present throughout the day -- Keep blinds open and lights on during daylight hours -- Minimize the use of opioids/benzodiazepines -- Melatonin 3 mg p.o. nightly -- TOC consulted  Myelofibrosis -- Hold home Jakafi given active infection -- Outpatient follow-up with medical oncology  Hypothyroidism -- Levothyroxine 25 mcg p.o. daily  Anxiety/depression -- Celexa 10 mg p.o. daily  Normocytic anemia Anemia of chronic medical disease Anemia panel with iron 16, TIBC low at 231, ferritin 446, B12 1559 and folate greater than 40. -- Hgb 9.0>7.5>7.6>7.5>8.4>7.7 -- Transfuse for hemoglobin less than 7.0 -- CBC daily  Weakness/debility/deconditioning Prior functional status, ambulatory with a walker. -- PT/OT recommend SNF placement -- Continue therapy efforts while inpatient -- TOC consulted for placement   DVT prophylaxis: heparin injection 5,000 Units Start: 04/13/23 2200    Code Status: Limited: Do not attempt resuscitation (DNR) -DNR-LIMITED -Do Not Intubate/DNI  Family Communication: Updated patient's daughter and son present at bedside this morning  Disposition Plan:  Level of care: Med-Surg Status is: Inpatient Remains inpatient appropriate because: Pending plastic surgery debridements of leg wound plan for 1/20, pending vascular surgery signed off and SNF placement    Consultants:  Vascular surgery Plastic surgery  Procedures:  Angiogram with stent  to SFA, 1/14  Antimicrobials:  Ceftriaxone 1/12>> Vancomycin 1/12 - 1/12   Subjective: Patient seen examined bedside, resting calmly.  Lying in bed.  Patient's  son present at bedside.  Patient without complaints this morning, pain controlled.  Awaiting plastic surgery for I&D and possible wound VAC placement to left lower extremity wound planned for Monday.  Seen by palliative care today, adding fentanyl as needed for premedication when doing therapy.  Patient's remains pleasantly confused. No acute events overnight per nursing staff.  Objective: Vitals:   04/17/23 2329 04/18/23 0403 04/18/23 0825 04/18/23 1116  BP: (!) 150/64 (!) 114/54 (!) 122/51 (!) 134/58  Pulse: 92 85 86 84  Resp: 20 20 20 20   Temp: 97.9 F (36.6 C) 98 F (36.7 C) 97.9 F (36.6 C) 98.1 F (36.7 C)  TempSrc: Oral Oral Oral Oral  SpO2: 97% 90% 91% 91%  Weight:      Height:        Intake/Output Summary (Last 24 hours) at 04/18/2023 1142 Last data filed at 04/18/2023 0830 Gross per 24 hour  Intake 100 ml  Output --  Net 100 ml   Filed Weights   04/13/23 1826  Weight: 52.8 kg    Examination:  Physical Exam: GEN: NAD, alert, pleasantly confused, chronically ill/elderly in appearance HEENT: NCAT, PERRL, EOMI, sclera clear PULM: CTAB w/o wheezes/crackles, normal respiratory effort, on room air with SpO2 98% at rest. CV: RRR w/o M/G/R GI: abd soft, NTND, NABS, no R/G/M MSK: no peripheral edema, moves all extremities independently, left lower extremity with wound depicted as below with several areas of ecchymosis in various stages of healing to extremities NEURO: No focal neurologic deficit PSYCH: Euthymic Integumentary: left lower extremity with wound depicted as below; currently wrapped with Kerlix and Ace wrap clean/dry/intact, with several areas of ecchymosis in various stages of healing to extremities    Data Reviewed: I have personally reviewed following labs and imaging studies  CBC: Recent Labs  Lab 04/13/23 0930 04/14/23 0327 04/15/23 0313 04/15/23 0632 04/16/23 0911 04/17/23 0312  WBC 12.4* 12.7* 13.4* 12.4* 16.1* 16.3*  NEUTROABS 9.0*  --   --    --   --   --   HGB 9.0* 7.5* 7.6* 7.5* 8.4* 7.7*  HCT 30.0* 24.9* 25.1* 24.6* 27.8* 25.5*  MCV 85.0 86.8 85.1 84.8 87.1 85.3  PLT 744* 595* 635* 670* 678* 715*   Basic Metabolic Panel: Recent Labs  Lab 04/13/23 0930 04/14/23 0327 04/15/23 0313 04/16/23 0911  NA 137 137 137 138  K 4.3 4.3 4.2 4.8  CL 98 102 101 103  CO2 24 23 26 22   GLUCOSE 146* 88 92 77  BUN 28* 24* 19 14  CREATININE 1.39* 1.04* 1.10* 1.11*  CALCIUM 9.7 8.8* 9.1 9.0  MG  --   --   --  2.0  PHOS  --   --   --  4.4   GFR: Estimated Creatinine Clearance: 22.3 mL/min (A) (by C-G formula based on SCr of 1.11 mg/dL (H)). Liver Function Tests: No results for input(s): "AST", "ALT", "ALKPHOS", "BILITOT", "PROT", "ALBUMIN" in the last 168 hours. No results for input(s): "LIPASE", "AMYLASE" in the last 168 hours. No results for input(s): "AMMONIA" in the last 168 hours. Coagulation Profile: No results for input(s): "INR", "PROTIME" in the last 168 hours. Cardiac Enzymes: No results for input(s): "CKTOTAL", "CKMB", "CKMBINDEX", "TROPONINI" in the last 168 hours. BNP (last 3 results) No results for input(s): "PROBNP" in the last 8760 hours. HbA1C:  No results for input(s): "HGBA1C" in the last 72 hours. CBG: Recent Labs  Lab 04/15/23 1603  GLUCAP 74   Lipid Profile: Recent Labs    04/16/23 0911  CHOL 157  HDL 37*  LDLCALC 100*  TRIG 101  CHOLHDL 4.2   Thyroid Function Tests: No results for input(s): "TSH", "T4TOTAL", "FREET4", "T3FREE", "THYROIDAB" in the last 72 hours. Anemia Panel: Recent Labs    04/16/23 0911  VITAMINB12 1,559*  FOLATE >40.0  FERRITIN 446*  TIBC 231*  IRON 16*  RETICCTPCT 4.0*   Sepsis Labs: No results for input(s): "PROCALCITON", "LATICACIDVEN" in the last 168 hours.  No results found for this or any previous visit (from the past 240 hours).       Radiology Studies: No results found.       Scheduled Meds:  acetaminophen  500 mg Oral Q6H   aspirin EC  81 mg  Oral Daily   calcitRIOL  0.25 mcg Oral Q M,W,F   cholecalciferol  1,000 Units Oral Daily   citalopram  10 mg Oral Daily   cyanocobalamin  1,000 mcg Oral Daily   feeding supplement  237 mL Oral BID BM   heparin  5,000 Units Subcutaneous Q8H   levothyroxine  25 mcg Oral Q0600   melatonin  6 mg Oral QHS   multivitamin with minerals  1 tablet Oral Daily   sodium chloride flush  3 mL Intravenous Q12H   Continuous Infusions:  cefTRIAXone (ROCEPHIN)  IV 1 g (04/18/23 1127)     LOS: 5 days    Time spent: 52 minutes spent on chart review, discussion with nursing staff, consultants, updating family and interview/physical exam; more than 50% of that time was spent in counseling and/or coordination of care.    Alvira Philips [place], DO [facility] Available via Epic secure chat 7am-7pm After these hours, please refer to coverage provider listed on [email or web address] 04/18/2023, 11:42 AM

## 2023-04-18 NOTE — Progress Notes (Signed)
Patient's IV was leaking so removed and order placed for IV team consult because pt doesn't have a good visible vein to attempt, B/L arms are bruised and skin is fragile.  Needs IV for IV PRN meds and IV Antibiotic.

## 2023-04-18 NOTE — Care Management Important Message (Signed)
Important Message  Patient Details  Name: Laura Wilcox MRN: [id number] Date of Birth: 1926-07-09   Important Message Given:  Yes - Medicare IM     Dorena Bodo 04/18/2023, 4:01 PM

## 2023-04-18 NOTE — Plan of Care (Signed)
  Problem: Clinical Measurements: Goal: Ability to maintain clinical measurements within normal limits will improve Outcome: Progressing   Problem: Clinical Measurements: Goal: Will remain free from infection Outcome: Progressing   Problem: Clinical Measurements: Goal: Diagnostic test results will improve Outcome: Progressing   Problem: Clinical Measurements: Goal: Respiratory complications will improve Outcome: Progressing   Problem: Nutrition: Goal: Adequate nutrition will be maintained Outcome: Progressing   Problem: Coping: Goal: Level of anxiety will decrease Outcome: Progressing   Problem: Pain Management: Goal: General experience of comfort will improve Outcome: Progressing

## 2023-04-19 DIAGNOSIS — I70222 Atherosclerosis of native arteries of extremities with rest pain, left leg: Secondary | ICD-10-CM | POA: Diagnosis not present

## 2023-04-19 DIAGNOSIS — S81802A Unspecified open wound, left lower leg, initial encounter: Secondary | ICD-10-CM | POA: Diagnosis not present

## 2023-04-19 NOTE — Progress Notes (Signed)
PROGRESS NOTE    Laura Wilcox  JYN:[id number] DOB: Jun 15, 1926 DOA: 04/13/2023 PCP: Smiley Houseman, NP    Brief Narrative:   Laura Wilcox is a 89 y.o. female with past medical history significant for advanced dementia, polycythemia vera, chronic myelofibrosis on oral chemotherapy, hypothyroidism who presented from her memory care unit to [facility] ED on 1/12 with severe pain to her left foot.  Patient unable to complete dressing changes at SNF due to pain.  Recently discharged from hospital 2 days prior with basic wound care after a large hematoma was evacuated.  Since discharge, staff unable to change bandages due to her symptoms.  Patient has pending outpatient appointment with plastic surgery, Dr. Hester Mates on 04/15/2023.  In the ED, temperature 97.8 F, HR 81, RR 18, BP 163/72, SpO2 93% on room air.  WBC 12.7, hemoglobin 7.5, platelet count 595.  Sodium 137, potassium 4.3, chloride 102, CO2 23, glucose 88, BUN 24, Cram 1.04.  Left foot x-ray with no evidence for acute fracture, no subluxation or dislocation.  TRH consulted for admission.  Assessment & Plan:   Left lower extremity nonhealing wound with cellulitis PAD s/p right SFA stenting Patient presenting to ED with severe pain during dressing changes at SNF.  Recently admitted with large hematoma that was evacuated to the left ankle region.  Was to see plastic surgery outpatient on 04/15/2023 but given poor healing and worsening pain sought further care back in the ED.  Patient's left foot cool to touch and recent ABI showing severe PAD.  Vascular surgery was consulted and patient underwent angiogram with stenting of left SFA by Dr. Myra Gianotti on 04/15/2023.  -- Vascular surgery following, appreciate assistance -- WBC 12.4>12.7>13.4>12.4>16.1>16.3 -- LDL 100, HDL 37 -- Ceftriaxone 1 g IV every 24 hours -- Tramadol 50 mg p.o. every 6 hours as needed moderate pain -- Fentanyl 12.5-25 mcg IV every 3 hours as needed severe pain, premedicate  prior to PT/OT session -- Aspirin 81 mg p.o. daily -- Statin discontinued per request of son -- Plastic surgery following, plan wound debridement and possible wound VAC placement on 1/20 -- CBC daily  Advanced dementia Patient currently resides in memory care unit.Marland Kitchen -- Delirium precautions -- Get up during the day -- Encourage a familiar face to remain present throughout the day -- Keep blinds open and lights on during daylight hours -- Minimize the use of opioids/benzodiazepines -- Melatonin 3 mg p.o. nightly -- TOC consulted  Myelofibrosis -- Hold home Jakafi given active infection -- Outpatient follow-up with medical oncology  Hypothyroidism -- Levothyroxine 25 mcg p.o. daily  Anxiety/depression -- Celexa 10 mg p.o. daily  Normocytic anemia Anemia of chronic medical disease Anemia panel with iron 16, TIBC low at 231, ferritin 446, B12 1559 and folate greater than 40. -- Hgb 9.0>7.5>7.6>7.5>8.4>7.7 -- Transfuse for hemoglobin less than 7.0 -- CBC daily  Weakness/debility/deconditioning Prior functional status, ambulatory with a walker. -- PT/OT recommend SNF placement -- Continue therapy efforts while inpatient -- TOC consulted for placement   DVT prophylaxis: heparin injection 5,000 Units Start: 04/13/23 2200    Code Status: Limited: Do not attempt resuscitation (DNR) -DNR-LIMITED -Do Not Intubate/DNI  Family Communication: No family present at bedside this morning  Disposition Plan:  Level of care: Med-Surg Status is: Inpatient Remains inpatient appropriate because: Pending plastic surgery debridements of leg wound plan for 1/20, pending vascular surgery signed off and SNF placement    Consultants:  Vascular surgery Plastic surgery  Procedures:  Angiogram with stent to SFA, 1/14  Antimicrobials:  Ceftriaxone 1/12>> Vancomycin 1/12 - 1/12   Subjective: Patient seen examined bedside, resting calmly.  Lying in bed.  Patient pleasantly confused, no  complaints this morning.  Plastic surgery plans I&D of left lower extremity wound with possible wound VAC placement on Monday 1/20. No acute events overnight per nursing staff.  Objective: Vitals:   04/19/23 0432 04/19/23 0832 04/19/23 0900 04/19/23 1140  BP: (!) 149/62 113/66  100/71  Pulse: 92 76 75 81  Resp: 18 18 18 19   Temp: 97.8 F (36.6 C) 97.6 F (36.4 C)  (!) 97.4 F (36.3 C)  TempSrc: Axillary Axillary  Oral  SpO2: 92% 95% 100% 93%  Weight:      Height:        Intake/Output Summary (Last 24 hours) at 04/19/2023 1258 Last data filed at 04/19/2023 4540 Gross per 24 hour  Intake 100 ml  Output 800 ml  Net -700 ml   Filed Weights   04/13/23 1826  Weight: 52.8 kg    Examination:  Physical Exam: GEN: NAD, alert, pleasantly confused, chronically ill/elderly in appearance HEENT: NCAT, PERRL, EOMI, sclera clear PULM: CTAB w/o wheezes/crackles, normal respiratory effort, on room air with SpO2 98% at rest. CV: RRR w/o M/G/R GI: abd soft, NTND, NABS, no R/G/M MSK: no peripheral edema, moves all extremities independently, left lower extremity with wound depicted as below with several areas of ecchymosis in various stages of healing to extremities NEURO: No focal neurologic deficit PSYCH: Euthymic Integumentary: left lower extremity with wound depicted as below; currently wrapped with Kerlix and Ace wrap clean/dry/intact, with several areas of ecchymosis in various stages of healing to extremities    Data Reviewed: I have personally reviewed following labs and imaging studies  CBC: Recent Labs  Lab 04/13/23 0930 04/14/23 0327 04/15/23 0313 04/15/23 0632 04/16/23 0911 04/17/23 0312  WBC 12.4* 12.7* 13.4* 12.4* 16.1* 16.3*  NEUTROABS 9.0*  --   --   --   --   --   HGB 9.0* 7.5* 7.6* 7.5* 8.4* 7.7*  HCT 30.0* 24.9* 25.1* 24.6* 27.8* 25.5*  MCV 85.0 86.8 85.1 84.8 87.1 85.3  PLT 744* 595* 635* 670* 678* 715*   Basic Metabolic Panel: Recent Labs  Lab  04/13/23 0930 04/14/23 0327 04/15/23 0313 04/16/23 0911  NA 137 137 137 138  K 4.3 4.3 4.2 4.8  CL 98 102 101 103  CO2 24 23 26 22   GLUCOSE 146* 88 92 77  BUN 28* 24* 19 14  CREATININE 1.39* 1.04* 1.10* 1.11*  CALCIUM 9.7 8.8* 9.1 9.0  MG  --   --   --  2.0  PHOS  --   --   --  4.4   GFR: Estimated Creatinine Clearance: 22.3 mL/min (A) (by C-G formula based on SCr of 1.11 mg/dL (H)). Liver Function Tests: No results for input(s): "AST", "ALT", "ALKPHOS", "BILITOT", "PROT", "ALBUMIN" in the last 168 hours. No results for input(s): "LIPASE", "AMYLASE" in the last 168 hours. No results for input(s): "AMMONIA" in the last 168 hours. Coagulation Profile: No results for input(s): "INR", "PROTIME" in the last 168 hours. Cardiac Enzymes: No results for input(s): "CKTOTAL", "CKMB", "CKMBINDEX", "TROPONINI" in the last 168 hours. BNP (last 3 results) No results for input(s): "PROBNP" in the last 8760 hours. HbA1C: No results for input(s): "HGBA1C" in the last 72 hours. CBG: Recent Labs  Lab 04/15/23 1603  GLUCAP 74   Lipid Profile: No results for input(s): "CHOL", "HDL", "LDLCALC", "TRIG", "CHOLHDL", "LDLDIRECT" in  the last 72 hours.  Thyroid Function Tests: No results for input(s): "TSH", "T4TOTAL", "FREET4", "T3FREE", "THYROIDAB" in the last 72 hours. Anemia Panel: No results for input(s): "VITAMINB12", "FOLATE", "FERRITIN", "TIBC", "IRON", "RETICCTPCT" in the last 72 hours.  Sepsis Labs: No results for input(s): "PROCALCITON", "LATICACIDVEN" in the last 168 hours.  No results found for this or any previous visit (from the past 240 hours).       Radiology Studies: No results found.       Scheduled Meds:  acetaminophen  500 mg Oral Q6H   aspirin EC  81 mg Oral Daily   calcitRIOL  0.25 mcg Oral Q M,W,F   cholecalciferol  1,000 Units Oral Daily   citalopram  10 mg Oral Daily   cyanocobalamin  1,000 mcg Oral Daily   feeding supplement  237 mL Oral BID BM    heparin  5,000 Units Subcutaneous Q8H   levothyroxine  25 mcg Oral Q0600   melatonin  6 mg Oral QHS   multivitamin with minerals  1 tablet Oral Daily   sodium chloride flush  3 mL Intravenous Q12H   Continuous Infusions:  cefTRIAXone (ROCEPHIN)  IV 1 g (04/19/23 1204)     LOS: 6 days    Time spent: 52 minutes spent on chart review, discussion with nursing staff, consultants, updating family and interview/physical exam; more than 50% of that time was spent in counseling and/or coordination of care.    Alvira Philips [place], DO [facility] Available via Epic secure chat 7am-7pm After these hours, please refer to coverage provider listed on [email or web address] 04/19/2023, 12:58 PM

## 2023-04-19 NOTE — Plan of Care (Signed)
  Problem: Clinical Measurements: Goal: Will remain free from infection Outcome: Progressing   Problem: Clinical Measurements: Goal: Ability to maintain clinical measurements within normal limits will improve Outcome: Progressing Goal: Will remain free from infection Outcome: Progressing Goal: Diagnostic test results will improve Outcome: Progressing Goal: Respiratory complications will improve Outcome: Progressing Goal: Cardiovascular complication will be avoided Outcome: Progressing   Problem: Nutrition: Goal: Adequate nutrition will be maintained Outcome: Progressing   Problem: Elimination: Goal: Will not experience complications related to urinary retention Outcome: Progressing   Problem: Pain Management: Goal: General experience of comfort will improve Outcome: Progressing   Problem: Skin Integrity: Goal: Risk for impaired skin integrity will decrease Outcome: Progressing

## 2023-04-20 DIAGNOSIS — L03116 Cellulitis of left lower limb: Secondary | ICD-10-CM | POA: Diagnosis not present

## 2023-04-20 DIAGNOSIS — I70222 Atherosclerosis of native arteries of extremities with rest pain, left leg: Secondary | ICD-10-CM | POA: Diagnosis not present

## 2023-04-20 DIAGNOSIS — T148XXA Other injury of unspecified body region, initial encounter: Secondary | ICD-10-CM

## 2023-04-20 LAB — BASIC METABOLIC PANEL
Anion gap: 7 (ref 5–15)
BUN: 24 mg/dL — ABNORMAL HIGH (ref 8–23)
CO2: 27 mmol/L (ref 22–32)
Calcium: 8.9 mg/dL (ref 8.9–10.3)
Chloride: 99 mmol/L (ref 98–111)
Creatinine, Ser: 1.11 mg/dL — ABNORMAL HIGH (ref 0.44–1.00)
GFR, Estimated: 46 mL/min — ABNORMAL LOW (ref >60)
Glucose, Bld: 83 mg/dL (ref 70–99)
Potassium: 4.2 mmol/L (ref 3.5–5.1)
Sodium: 133 mmol/L — ABNORMAL LOW (ref 135–145)

## 2023-04-20 LAB — HEMOGLOBIN AND HEMATOCRIT, BLOOD
HCT: 28.9 % — ABNORMAL LOW (ref 36.0–46.0)
Hemoglobin: 9.1 g/dL — ABNORMAL LOW (ref 12.0–15.0)

## 2023-04-20 LAB — CBC
HCT: 23.7 % — ABNORMAL LOW (ref 36.0–46.0)
Hemoglobin: 7.1 g/dL — ABNORMAL LOW (ref 12.0–15.0)
MCH: 25.4 pg — ABNORMAL LOW (ref 26.0–34.0)
MCHC: 30 g/dL (ref 30.0–36.0)
MCV: 84.6 fL (ref 80.0–100.0)
Platelets: 684 10*3/uL — ABNORMAL HIGH (ref 150–400)
RBC: 2.8 MIL/uL — ABNORMAL LOW (ref 3.87–5.11)
RDW: 28.7 % — ABNORMAL HIGH (ref 11.5–15.5)
WBC: 15.5 10*3/uL — ABNORMAL HIGH (ref 4.0–10.5)
nRBC: 1.5 % — ABNORMAL HIGH (ref 0.0–0.2)

## 2023-04-20 LAB — PREPARE RBC (CROSSMATCH)

## 2023-04-20 LAB — GLUCOSE, CAPILLARY: Glucose-Capillary: 83 mg/dL (ref 70–99)

## 2023-04-20 LAB — MAGNESIUM: Magnesium: 2 mg/dL (ref 1.7–2.4)

## 2023-04-20 MED ORDER — SODIUM CHLORIDE 0.9% IV SOLUTION: Freq: Once | INTRAVENOUS | Status: AC

## 2023-04-20 MED ORDER — SODIUM CHLORIDE 0.9 % IV SOLN: INTRAVENOUS | Status: DC

## 2023-04-20 NOTE — Progress Notes (Signed)
Patient is sleeping since this morning, she was incontinent, Her blood pressure is on soft side, she has not eaten anything since morning.  Blood pressure checked at 1315 again 118/47, MAP 67. Blood transfusion started at 1315. Informed to the MD via secure chat.

## 2023-04-20 NOTE — Anesthesia Preprocedure Evaluation (Signed)
Anesthesia Evaluation  Patient identified by MRN, date of birth, ID band Patient awake    Reviewed: Allergy & Precautions, NPO status , Patient's Chart, lab work & pertinent test results  Airway Mallampati: II  TM Distance: >3 FB Neck ROM: Full    Dental  (+) Poor Dentition   Pulmonary neg pulmonary ROS   Pulmonary exam normal        Cardiovascular negative cardio ROS  Rhythm:Regular Rate:Normal     Neuro/Psych    Depression   Dementia negative neurological ROS     GI/Hepatic negative GI ROS, Neg liver ROS,,,  Endo/Other  negative endocrine ROS    Renal/GU   negative genitourinary   Musculoskeletal Ankle wound   Abdominal Normal abdominal exam  (+)   Peds  Hematology  (+) Blood dyscrasia, anemia Lab Results      Component                Value               Date                      WBC                      15.5 (H)            04/20/2023                HGB                      9.1 (L)             04/20/2023                HCT                      28.9 (L)            04/20/2023                MCV                      84.6                04/20/2023                PLT                      684 (H)             04/20/2023              Anesthesia Other Findings   Reproductive/Obstetrics                             Anesthesia Physical Anesthesia Plan  ASA: 3  Anesthesia Plan: General   Post-op Pain Management: Tylenol PO (pre-op)*   Induction: Intravenous  PONV Risk Score and Plan: 3 and Ondansetron, Dexamethasone and Treatment may vary due to age or medical condition  Airway Management Planned: Mask and LMA  Additional Equipment: None  Intra-op Plan:   Post-operative Plan: Extubation in OR  Informed Consent: I have reviewed the patients History and Physical, chart, labs and discussed the procedure including the risks, benefits and alternatives for the proposed anesthesia with the  patient or authorized representative who has indicated his/her understanding and acceptance.   Patient  has DNR.  Discussed DNR with power of attorney and Suspend DNR.   Dental advisory given and Consent reviewed with POA  Plan Discussed with:   Anesthesia Plan Comments:        Anesthesia Quick Evaluation

## 2023-04-20 NOTE — Progress Notes (Signed)
PROGRESS NOTE    Laura Wilcox  XBM:[id number] DOB: 1926-11-16 DOA: 04/13/2023 PCP: Smiley Houseman, NP    Brief Narrative:   Laura Wilcox is a 89 y.o. female with past medical history significant for advanced dementia, polycythemia vera, chronic myelofibrosis on oral chemotherapy, hypothyroidism who presented from her memory care unit to [facility] ED on 1/12 with severe pain to her left foot.  Patient unable to complete dressing changes at SNF due to pain.  Recently discharged from hospital 2 days prior with basic wound care after a large hematoma was evacuated.  Since discharge, staff unable to change bandages due to her symptoms.  Patient has pending outpatient appointment with plastic surgery, Dr. Hester Mates on 04/15/2023.  In the ED, temperature 97.8 F, HR 81, RR 18, BP 163/72, SpO2 93% on room air.  WBC 12.7, hemoglobin 7.5, platelet count 595.  Sodium 137, potassium 4.3, chloride 102, CO2 23, glucose 88, BUN 24, Cram 1.04.  Left foot x-ray with no evidence for acute fracture, no subluxation or dislocation.  TRH consulted for admission.  Assessment & Plan:   Left lower extremity nonhealing wound with cellulitis PAD s/p right SFA stenting Patient presenting to ED with severe pain during dressing changes at SNF.  Recently admitted with large hematoma that was evacuated to the left ankle region.  Was to see plastic surgery outpatient on 04/15/2023 but given poor healing and worsening pain sought further care back in the ED.  Patient's left foot cool to touch and recent ABI showing severe PAD.  Vascular surgery was consulted and patient underwent angiogram with stenting of left SFA by Dr. Myra Gianotti on 04/15/2023.  -- Vascular surgery following, appreciate assistance -- WBC 12.4>12.7>13.4>12.4>16.1>16.3>15.5 -- LDL 100, HDL 37 -- Ceftriaxone 1 g IV every 24 hours -- Tramadol 50 mg p.o. every 6 hours as needed moderate pain -- Fentanyl 12.5-25 mcg IV every 3 hours as needed severe pain,  premedicate prior to PT/OT session -- Aspirin 81 mg p.o. daily -- Statin discontinued per request of son -- Plastic surgery following, plan wound debridement and possible wound VAC placement on 1/20, n.p.o. after midnight -- CBC daily  Advanced dementia Patient currently resides in memory care unit. -- Delirium precautions -- Get up during the day -- Encourage a familiar face to remain present throughout the day -- Keep blinds open and lights on during daylight hours -- Minimize the use of opioids/benzodiazepines -- Melatonin 3 mg p.o. nightly -- TOC consulted  Myelofibrosis -- Hold home Jakafi given active infection -- Outpatient follow-up with medical oncology  Hypothyroidism -- Levothyroxine 25 mcg p.o. daily  Anxiety/depression -- Celexa 10 mg p.o. daily  Normocytic anemia Anemia of chronic medical disease Anemia panel with iron 16, TIBC low at 231, ferritin 446, B12 1559 and folate greater than 40. -- Hgb 9.0>7.5>7.6>7.5>8.4>7.7>731 -- Type and screen -- Transfuse 1 unit PRBC today -- Repeat H&H after transfusion -- CBC daily  Weakness/debility/deconditioning Prior functional status, ambulatory with a walker. -- PT/OT recommend SNF placement -- Continue therapy efforts while inpatient -- TOC consulted for placement   DVT prophylaxis: heparin injection 5,000 Units Start: 04/13/23 2200    Code Status: Limited: Do not attempt resuscitation (DNR) -DNR-LIMITED -Do Not Intubate/DNI  Family Communication: No family present at bedside this morning, updated patient's daughter Laura Wilcox via telephone this morning  Disposition Plan:  Level of care: Med-Surg Status is: Inpatient Remains inpatient appropriate because: Pending plastic surgery debridements of leg wound planned for tomorrow, pending vascular surgery sign off and SNF  placement    Consultants:  Vascular surgery Plastic surgery  Procedures:  Angiogram with stent to SFA, 1/14  Antimicrobials:  Ceftriaxone  1/12>> Vancomycin 1/12 - 1/12   Subjective: Patient seen examined bedside, resting calmly.  Lying in bed.  Sleeping but arousable.  Remains pleasantly confused.  Hemoglobin down to 7.1, will transfuse 1 unit PRBC; discussed with patient's daughter Laura Wilcox this morning who agrees.    Plastic surgery plans I&D of left lower extremity wound with possible wound VAC placement tomorrow. No acute events overnight per nursing staff.  Objective: Vitals:   04/19/23 2017 04/19/23 2324 04/20/23 0411 04/20/23 0819  BP: (!) 115/53 (!) 116/55 (!) 112/59 (!) 138/57  Pulse: 79 77 76 80  Resp: 18 18 18 18   Temp: 98.1 F (36.7 C) 97.6 F (36.4 C) 97.6 F (36.4 C) 97.7 F (36.5 C)  TempSrc: Oral Oral Oral Oral  SpO2: 96% 94% 94% 94%  Weight:      Height:        Intake/Output Summary (Last 24 hours) at 04/20/2023 1027 Last data filed at 04/19/2023 1230 Gross per 24 hour  Intake 200 ml  Output --  Net 200 ml   Filed Weights   04/13/23 1826  Weight: 52.8 kg    Examination:  Physical Exam: GEN: NAD, sleeping but arousable, pleasantly confused, chronically ill/elderly in appearance HEENT: NCAT, PERRL, EOMI, sclera clear PULM: CTAB w/o wheezes/crackles, normal respiratory effort, on room air with SpO2 98% at rest. CV: RRR w/o M/G/R GI: abd soft, NTND, NABS, no R/G/M MSK: no peripheral edema, moves all extremities independently, left lower extremity with wound depicted as below with several areas of ecchymosis in various stages of healing to extremities NEURO: No focal neurologic deficit PSYCH: Euthymic Integumentary: left lower extremity with wound depicted as below; currently wrapped with Kerlix clean/dry/intact, with several areas of ecchymosis in various stages of healing to extremities    Data Reviewed: I have personally reviewed following labs and imaging studies  CBC: Recent Labs  Lab 04/15/23 0313 04/15/23 0632 04/16/23 0911 04/17/23 0312 04/20/23 0312  WBC 13.4* 12.4* 16.1*  16.3* 15.5*  HGB 7.6* 7.5* 8.4* 7.7* 7.1*  HCT 25.1* 24.6* 27.8* 25.5* 23.7*  MCV 85.1 84.8 87.1 85.3 84.6  PLT 635* 670* 678* 715* 684*   Basic Metabolic Panel: Recent Labs  Lab 04/14/23 0327 04/15/23 0313 04/16/23 0911 04/20/23 0312  NA 137 137 138 133*  K 4.3 4.2 4.8 4.2  CL 102 101 103 99  CO2 23 26 22 27   GLUCOSE 88 92 77 83  BUN 24* 19 14 24*  CREATININE 1.04* 1.10* 1.11* 1.11*  CALCIUM 8.8* 9.1 9.0 8.9  MG  --   --  2.0 2.0  PHOS  --   --  4.4  --    GFR: Estimated Creatinine Clearance: 22.3 mL/min (A) (by C-G formula based on SCr of 1.11 mg/dL (H)). Liver Function Tests: No results for input(s): "AST", "ALT", "ALKPHOS", "BILITOT", "PROT", "ALBUMIN" in the last 168 hours. No results for input(s): "LIPASE", "AMYLASE" in the last 168 hours. No results for input(s): "AMMONIA" in the last 168 hours. Coagulation Profile: No results for input(s): "INR", "PROTIME" in the last 168 hours. Cardiac Enzymes: No results for input(s): "CKTOTAL", "CKMB", "CKMBINDEX", "TROPONINI" in the last 168 hours. BNP (last 3 results) No results for input(s): "PROBNP" in the last 8760 hours. HbA1C: No results for input(s): "HGBA1C" in the last 72 hours. CBG: Recent Labs  Lab 04/15/23 1603  GLUCAP 74  Lipid Profile: No results for input(s): "CHOL", "HDL", "LDLCALC", "TRIG", "CHOLHDL", "LDLDIRECT" in the last 72 hours.  Thyroid Function Tests: No results for input(s): "TSH", "T4TOTAL", "FREET4", "T3FREE", "THYROIDAB" in the last 72 hours. Anemia Panel: No results for input(s): "VITAMINB12", "FOLATE", "FERRITIN", "TIBC", "IRON", "RETICCTPCT" in the last 72 hours.  Sepsis Labs: No results for input(s): "PROCALCITON", "LATICACIDVEN" in the last 168 hours.  No results found for this or any previous visit (from the past 240 hours).       Radiology Studies: No results found.       Scheduled Meds:  sodium chloride   Intravenous Once   acetaminophen  500 mg Oral Q6H    aspirin EC  81 mg Oral Daily   calcitRIOL  0.25 mcg Oral Q M,W,F   cholecalciferol  1,000 Units Oral Daily   citalopram  10 mg Oral Daily   cyanocobalamin  1,000 mcg Oral Daily   feeding supplement  237 mL Oral BID BM   heparin  5,000 Units Subcutaneous Q8H   levothyroxine  25 mcg Oral Q0600   melatonin  6 mg Oral QHS   multivitamin with minerals  1 tablet Oral Daily   sodium chloride flush  3 mL Intravenous Q12H   Continuous Infusions:  cefTRIAXone (ROCEPHIN)  IV 1 g (04/19/23 1204)     LOS: 7 days    Time spent: 52 minutes spent on chart review, discussion with nursing staff, consultants, updating family and interview/physical exam; more than 50% of that time was spent in counseling and/or coordination of care.    Alvira Philips [place], DO [facility] Available via Epic secure chat 7am-7pm After these hours, please refer to coverage provider listed on [email or web address] 04/20/2023, 10:27 AM

## 2023-04-20 NOTE — Plan of Care (Signed)
  Problem: Clinical Measurements: Goal: Ability to maintain clinical measurements within normal limits will improve Outcome: Progressing Goal: Will remain free from infection Outcome: Progressing Goal: Diagnostic test results will improve Outcome: Progressing Goal: Respiratory complications will improve Outcome: Progressing Goal: Cardiovascular complication will be avoided Outcome: Progressing   Problem: Clinical Measurements: Goal: Will remain free from infection Outcome: Progressing   Problem: Clinical Measurements: Goal: Diagnostic test results will improve Outcome: Progressing   Problem: Clinical Measurements: Goal: Respiratory complications will improve Outcome: Progressing   Problem: Elimination: Goal: Will not experience complications related to bowel motility Outcome: Progressing Goal: Will not experience complications related to urinary retention Outcome: Progressing   Problem: Elimination: Goal: Will not experience complications related to urinary retention Outcome: Progressing

## 2023-04-20 NOTE — Progress Notes (Addendum)
Spo2 was 88% on RA so 02 started at 2ltrs via [place]  Patient was sleeping throughout the day, she was not able to eat and drink, no urine output since 12pm today, bladder scan showed of bladder volume, informed to the MD, IVF started as per order.  Patient was coughing even with crushed meds with applesauce,. MD aware.

## 2023-04-20 NOTE — Plan of Care (Signed)

## 2023-04-21 ENCOUNTER — Other Ambulatory Visit: Payer: Self-pay

## 2023-04-21 ENCOUNTER — Inpatient Hospital Stay (HOSPITAL_COMMUNITY): Payer: Medicare Other | Admitting: Anesthesiology

## 2023-04-21 ENCOUNTER — Encounter (HOSPITAL_COMMUNITY)
Admission: EM | Disposition: A | Discharge status: Skilled Nursing Facility | Payer: Self-pay | Source: Ambulatory Visit | Attending: Internal Medicine

## 2023-04-21 ENCOUNTER — Inpatient Hospital Stay (HOSPITAL_COMMUNITY): Payer: Self-pay | Admitting: Anesthesiology

## 2023-04-21 ENCOUNTER — Encounter (HOSPITAL_COMMUNITY): Payer: Self-pay | Admitting: Internal Medicine

## 2023-04-21 DIAGNOSIS — N1832 Chronic kidney disease, stage 3b: Secondary | ICD-10-CM | POA: Diagnosis not present

## 2023-04-21 DIAGNOSIS — F32A Depression, unspecified: Secondary | ICD-10-CM

## 2023-04-21 DIAGNOSIS — T148XXA Other injury of unspecified body region, initial encounter: Secondary | ICD-10-CM | POA: Diagnosis not present

## 2023-04-21 DIAGNOSIS — S81802A Unspecified open wound, left lower leg, initial encounter: Secondary | ICD-10-CM | POA: Diagnosis not present

## 2023-04-21 DIAGNOSIS — I70222 Atherosclerosis of native arteries of extremities with rest pain, left leg: Secondary | ICD-10-CM | POA: Diagnosis not present

## 2023-04-21 HISTORY — PX: INCISION AND DRAINAGE OF WOUND: SHX1803

## 2023-04-21 HISTORY — PX: APPLICATION OF WOUND VAC: SHX5189

## 2023-04-21 LAB — BPAM RBC
Blood Product Expiration Date: 202502082359
ISSUE DATE / TIME: 202501191305
Unit Type and Rh: 5100

## 2023-04-21 LAB — CBC
HCT: 28.4 % — ABNORMAL LOW (ref 36.0–46.0)
Hemoglobin: 8.9 g/dL — ABNORMAL LOW (ref 12.0–15.0)
MCH: 26.3 pg (ref 26.0–34.0)
MCHC: 31.3 g/dL (ref 30.0–36.0)
MCV: 84 fL (ref 80.0–100.0)
Platelets: 648 10*3/uL — ABNORMAL HIGH (ref 150–400)
RBC: 3.38 MIL/uL — ABNORMAL LOW (ref 3.87–5.11)
RDW: 26.1 % — ABNORMAL HIGH (ref 11.5–15.5)
WBC: 15.5 10*3/uL — ABNORMAL HIGH (ref 4.0–10.5)
nRBC: 1.7 % — ABNORMAL HIGH (ref 0.0–0.2)

## 2023-04-21 LAB — TYPE AND SCREEN
ABO/RH(D): O POS
Antibody Screen: NEGATIVE
Unit division: 0

## 2023-04-21 LAB — BASIC METABOLIC PANEL
Anion gap: 11 (ref 5–15)
BUN: 23 mg/dL (ref 8–23)
CO2: 23 mmol/L (ref 22–32)
Calcium: 8.7 mg/dL — ABNORMAL LOW (ref 8.9–10.3)
Chloride: 101 mmol/L (ref 98–111)
Creatinine, Ser: 1.15 mg/dL — ABNORMAL HIGH (ref 0.44–1.00)
GFR, Estimated: 44 mL/min — ABNORMAL LOW (ref >60)
Glucose, Bld: 90 mg/dL (ref 70–99)
Potassium: 4.7 mmol/L (ref 3.5–5.1)
Sodium: 135 mmol/L (ref 135–145)

## 2023-04-21 SURGERY — IRRIGATION AND DEBRIDEMENT WOUND
Anesthesia: General | Site: Ankle | Laterality: Left

## 2023-04-21 MED ORDER — CHLORHEXIDINE GLUCONATE CLOTH 2 % EX PADS: 6.0000 | MEDICATED_PAD | Freq: Once | CUTANEOUS | Status: DC

## 2023-04-21 MED ORDER — FENTANYL CITRATE (PF) 250 MCG/5ML IJ SOLN
INTRAMUSCULAR | Status: DC | PRN
  Administered 2023-04-21 (×2): 50 ug via INTRAVENOUS

## 2023-04-21 MED ORDER — CEFAZOLIN SODIUM-DEXTROSE 2-4 GM/100ML-% IV SOLN
2.0000 g | INTRAVENOUS | Status: AC
  Administered 2023-04-21: 2 g via INTRAVENOUS

## 2023-04-21 MED ORDER — PROPOFOL 500 MG/50ML IV EMUL
INTRAVENOUS | Status: DC | PRN
  Administered 2023-04-21: 100 ug/kg/min via INTRAVENOUS

## 2023-04-21 MED ORDER — FENTANYL CITRATE (PF) 100 MCG/2ML IJ SOLN
INTRAMUSCULAR | Status: AC
  Filled 2023-04-21: qty 2

## 2023-04-21 MED ORDER — ONDANSETRON HCL 4 MG/2ML IJ SOLN
INTRAMUSCULAR | Status: AC
  Filled 2023-04-21: qty 2

## 2023-04-21 MED ORDER — CEFAZOLIN SODIUM-DEXTROSE 2-4 GM/100ML-% IV SOLN
INTRAVENOUS | Status: AC
  Filled 2023-04-21: qty 100

## 2023-04-21 MED ORDER — LIDOCAINE 2% (20 MG/ML) 5 ML SYRINGE
INTRAMUSCULAR | Status: DC | PRN
  Administered 2023-04-21: 40 mg via INTRAVENOUS

## 2023-04-21 MED ORDER — ACETAMINOPHEN 500 MG PO TABS: 1000.0000 mg | ORAL_TABLET | Freq: Once | ORAL | Status: DC

## 2023-04-21 MED ORDER — ONDANSETRON HCL 4 MG/2ML IJ SOLN
INTRAMUSCULAR | Status: DC | PRN
  Administered 2023-04-21: 4 mg via INTRAVENOUS

## 2023-04-21 MED ORDER — CHLORHEXIDINE GLUCONATE 0.12 % MT SOLN
OROMUCOSAL | Status: AC
  Filled 2023-04-21: qty 15

## 2023-04-21 MED ORDER — PROPOFOL 10 MG/ML IV BOLUS
INTRAVENOUS | Status: AC
Start: 2023-04-21 — End: ?
  Filled 2023-04-21: qty 20

## 2023-04-21 MED ORDER — PHENYLEPHRINE 80 MCG/ML (10ML) SYRINGE FOR IV PUSH (FOR BLOOD PRESSURE SUPPORT)
PREFILLED_SYRINGE | INTRAVENOUS | Status: AC
  Filled 2023-04-21: qty 10

## 2023-04-21 MED ORDER — 0.9 % SODIUM CHLORIDE (POUR BTL) OPTIME
TOPICAL | Status: DC | PRN
  Administered 2023-04-21: 1000 mL

## 2023-04-21 MED ORDER — LIDOCAINE 2% (20 MG/ML) 5 ML SYRINGE
INTRAMUSCULAR | Status: AC
  Filled 2023-04-21: qty 5

## 2023-04-21 MED ORDER — FENTANYL CITRATE (PF) 100 MCG/2ML IJ SOLN
25.0000 ug | INTRAMUSCULAR | Status: DC | PRN
  Administered 2023-04-21: 25 ug via INTRAVENOUS

## 2023-04-21 MED ORDER — VASHE WOUND IRRIGATION OPTIME
TOPICAL | Status: DC | PRN
  Administered 2023-04-21: 34 [oz_av]

## 2023-04-21 MED ORDER — PROPOFOL 10 MG/ML IV BOLUS
INTRAVENOUS | Status: DC | PRN
  Administered 2023-04-21: 90 mg via INTRAVENOUS

## 2023-04-21 MED ORDER — PHENYLEPHRINE HCL-NACL 20-0.9 MG/250ML-% IV SOLN
INTRAVENOUS | Status: DC | PRN
  Administered 2023-04-21: 50 ug/min via INTRAVENOUS

## 2023-04-21 SURGICAL SUPPLY — 55 items
APPLICATOR COTTON TIP 6 STRL (MISCELLANEOUS) IMPLANT
APPLICATOR COTTON TIP 6IN STRL (MISCELLANEOUS) IMPLANT
BAG COUNTER SPONGE SURGICOUNT (BAG) ×2 IMPLANT
BAG DECANTER FOR FLEXI CONT (MISCELLANEOUS) IMPLANT
BENZOIN TINCTURE PRP APPL 2/3 (GAUZE/BANDAGES/DRESSINGS) ×2 IMPLANT
BNDG ELASTIC 4INX 5YD STR LF (GAUZE/BANDAGES/DRESSINGS) IMPLANT
BNDG GAUZE DERMACEA FLUFF 4 (GAUZE/BANDAGES/DRESSINGS) IMPLANT
CANISTER SUCT 3000ML PPV (MISCELLANEOUS) ×2 IMPLANT
CLEANSER WND VASHE 34 (WOUND CARE) IMPLANT
CNTNR URN SCR LID CUP LEK RST (MISCELLANEOUS) IMPLANT
COVER SURGICAL LIGHT HANDLE (MISCELLANEOUS) ×2 IMPLANT
DRAIN CHANNEL 19F RND (DRAIN) IMPLANT
DRAIN JP 10F RND SILICONE (MISCELLANEOUS) IMPLANT
DRAPE HALF SHEET 40X57 (DRAPES) IMPLANT
DRAPE IMP U-DRAPE 54X76 (DRAPES) ×2 IMPLANT
DRAPE INCISE IOBAN 66X45 STRL (DRAPES) IMPLANT
DRAPE LAPAROSCOPIC ABDOMINAL (DRAPES) IMPLANT
DRAPE LAPAROTOMY 100X72 PEDS (DRAPES) ×2 IMPLANT
DRESSING MORCELLS FINE 1000 (Tissue) IMPLANT
DRSG ADAPTIC 3X8 NADH LF (GAUZE/BANDAGES/DRESSINGS) IMPLANT
DRSG CUTIMED SORBACT 7X9 (GAUZE/BANDAGES/DRESSINGS) IMPLANT
DRSG HYDROCOLLOID 4X4 (GAUZE/BANDAGES/DRESSINGS) IMPLANT
DRSG VAC GRANUFOAM LG (GAUZE/BANDAGES/DRESSINGS) IMPLANT
DRSG VAC GRANUFOAM MED (GAUZE/BANDAGES/DRESSINGS) IMPLANT
DRSG VAC GRANUFOAM SM (GAUZE/BANDAGES/DRESSINGS) IMPLANT
ELECT CAUTERY BLADE 6.4 (BLADE) IMPLANT
ELECT REM PT RETURN 9FT ADLT (ELECTROSURGICAL) ×2 IMPLANT
ELECTRODE REM PT RTRN 9FT ADLT (ELECTROSURGICAL) ×2 IMPLANT
GAUZE PAD ABD 8X10 STRL (GAUZE/BANDAGES/DRESSINGS) IMPLANT
GAUZE SPONGE 4X4 12PLY STRL (GAUZE/BANDAGES/DRESSINGS) IMPLANT
GLOVE BIO SURGEON STRL SZ 6.5 (GLOVE) ×2 IMPLANT
GLOVE BIOGEL M 6.5 STRL (GLOVE) ×2 IMPLANT
GOWN STRL REUS W/ TWL LRG LVL3 (GOWN DISPOSABLE) ×6 IMPLANT
GRAFT MYRIAD 3 LAYER 10X10 (Graft) IMPLANT
KIT BASIN OR (CUSTOM PROCEDURE TRAY) ×2 IMPLANT
KIT TURNOVER KIT B (KITS) ×2 IMPLANT
NDL HYPO 25GX1X1/2 BEV (NEEDLE) ×2 IMPLANT
NEEDLE HYPO 25GX1X1/2 BEV (NEEDLE) ×2 IMPLANT
NS IRRIG 1000ML POUR BTL (IV SOLUTION) ×2 IMPLANT
PACK GENERAL/GYN (CUSTOM PROCEDURE TRAY) ×2 IMPLANT
PACK UNIVERSAL I (CUSTOM PROCEDURE TRAY) ×2 IMPLANT
PAD ARMBOARD 7.5X6 YLW CONV (MISCELLANEOUS) ×4 IMPLANT
STAPLER VISISTAT 35W (STAPLE) ×2 IMPLANT
SURGILUBE 2OZ TUBE FLIPTOP (MISCELLANEOUS) IMPLANT
SUT MNCRL AB 3-0 PS2 27 (SUTURE) IMPLANT
SUT MNCRL AB 4-0 PS2 18 (SUTURE) IMPLANT
SUT MON AB 2-0 CT1 36 (SUTURE) IMPLANT
SUT MON AB 5-0 PS2 18 (SUTURE) IMPLANT
SUT VIC AB 5-0 PS2 18 (SUTURE) IMPLANT
SUT VICRYL 3 0 (SUTURE) IMPLANT
SWAB COLLECTION DEVICE MRSA (MISCELLANEOUS) IMPLANT
SWAB CULTURE ESWAB REG 1ML (MISCELLANEOUS) IMPLANT
SYR CONTROL 10ML LL (SYRINGE) ×2 IMPLANT
TOWEL GREEN STERILE (TOWEL DISPOSABLE) ×2 IMPLANT
UNDERPAD 30X36 HEAVY ABSORB (UNDERPADS AND DIAPERS) ×2 IMPLANT

## 2023-04-21 NOTE — Transfer of Care (Signed)
Immediate Anesthesia Transfer of Care Note  Patient: Laura Wilcox  Procedure(s) Performed: IRRIGATION AND DEBRIDEMENT WOUND (Left: Ankle) APPLICATION OF SKIN SUBSTITUTE (Left: Ankle) APPLICATION OF WOUND VAC (Left)  Patient Location: PACU  Anesthesia Type:General  Level of Consciousness: drowsy  Airway & Oxygen Therapy: Patient Spontanous Breathing and Patient connected to nasal cannula oxygen  Post-op Assessment: Report given to RN and Post -op Vital signs reviewed and stable  Post vital signs: Reviewed and stable  Last Vitals:  Vitals Value Taken Time  BP 134/57 04/21/23 0812  Temp    Pulse 70 04/21/23 0814  Resp 19 04/21/23 0814  SpO2 99 % 04/21/23 0814  Vitals shown include unfiled device data.  Last Pain:  Vitals:   04/20/23 2245  TempSrc: Oral  PainSc:       Patients Stated Pain Goal: 0 (04/19/23 0900)  Complications: No notable events documented.

## 2023-04-21 NOTE — NC FL2 (Signed)
[place] MEDICAID FL2 LEVEL OF CARE FORM     IDENTIFICATION  Patient Name: Laura Wilcox Birthdate: 03-May-1926 Sex: female Admission Date (Current Location): 04/13/2023  [facility] and [place] Number:  Chiropodist and Address:  [facility] [place], [address], [place], [place] [address]      Provider Number: [id number]  Attending Physician Name and Address:  [place], Alvira Philips, DO  Relative Name and Phone Number:       Current Level of Care: Hospital Recommended Level of Care: Skilled Nursing Facility Prior Approval Number:    Date Approved/Denied:   PASRR Number: [id number]  Discharge Plan: SNF    Current Diagnoses: Patient Active Problem List   Diagnosis Date Noted   Wound of left leg 04/21/2023   Cellulitis 04/13/2023   Hematoma of left ankle 03/31/2023   Hematoma 03/31/2023   SCC (squamous cell carcinoma) 03/19/2022   Skin tear of lower leg without complication, right, initial encounter 09/24/2021   Sepsis (HCC) 09/24/2021   Leg wound, right 09/24/2021   Encounter for antineoplastic chemotherapy 09/21/2021   Senile dementia with acute confusional state, without behavioral disturbance (HCC) 06/26/2021   Depression 06/26/2021   Weakness 06/26/2021   UTI (urinary tract infection) 06/25/2021   Myelofibrosis (HCC) 01/31/2020   Other pancytopenia (HCC) 01/31/2020   Moderate protein-calorie malnutrition (HCC)    Squamous cell cancer of skin of left cheek 01/16/2020   Chronic kidney disease, stage 3b (HCC) 03/11/2019   Secondary hyperparathyroidism of renal origin (HCC) 03/11/2019   Anemia in chronic kidney disease 03/11/2019   Elevated ferritin 02/18/2018   Goals of care, counseling/discussion 07/09/2017   Polycythemia rubra vera (HCC) 06/02/2017   Protein-calorie malnutrition, moderate (HCC) 06/02/2017   Advanced care planning/counseling discussion 06/02/2017   Age-related cognitive decline 06/02/2017    Orientation RESPIRATION  BLADDER Height & Weight     Self  Normal Incontinent, External catheter Weight: 116 lb 6.4 oz (52.8 kg) Height:  5' (152.4 cm)  BEHAVIORAL SYMPTOMS/MOOD NEUROLOGICAL BOWEL NUTRITION STATUS      Incontinent Diet (please see discharge summary)  AMBULATORY STATUS COMMUNICATION OF NEEDS Skin   Limited Assist Verbally Surgical wounds, Wound Vac, PU Stage and Appropriate Care (Stage I on heel; wound incision laceration, pretibial Right ; SNF will need to order wound vac)                       Personal Care Assistance Level of Assistance  Bathing, Feeding, Dressing Bathing Assistance: Maximum assistance Feeding assistance: Limited assistance Dressing Assistance: Limited assistance     Functional Limitations Info  Sight, Hearing Sight Info: Impaired (glasses) Hearing Info: Impaired Speech Info: Adequate    SPECIAL CARE FACTORS FREQUENCY  PT (By licensed PT), OT (By licensed OT)     PT Frequency: 5x per week OT Frequency: 5x per week            Contractures Contractures Info: Not present    Additional Factors Info  Code Status, Allergies Code Status Info: DNR limited Allergies Info: Bactrim,Epinephrine           Current Medications (04/21/2023):  This is the current hospital active medication list Current Facility-Administered Medications  Medication Dose Route Frequency Provider Last Rate Last Admin   0.9 %  sodium chloride infusion   Intravenous Continuous [place], Eric J, DO 75 mL/hr at 04/21/23 0726 Continued from Pre-op at 04/21/23 0726   acetaminophen (TYLENOL) tablet 500 mg  500 mg Oral Q6H Cla[place]orne,  Debarah Crape, MD   500 mg at 04/21/23 0534   acetaminophen (TYLENOL) tablet 650 mg  650 mg Oral Q4H PRN Nada Libman, MD       aspirin EC tablet 81 mg  81 mg Oral Daily Nada Libman, MD   81 mg at 04/20/23 4098   calcitRIOL (ROCALTROL) capsule 0.25 mcg  0.25 mcg Oral Q M,W,F Buena Irish, MD   0.25 mcg at 04/18/23 1191   cefTRIAXone (ROCEPHIN) 1 g in  sodium chloride 0.9 % 100 mL IVPB  1 g Intravenous Q24H [place], Alvira Philips, DO 200 mL/hr at 04/20/23 1118 1 g at 04/20/23 1118   chlorhexidine (PERIDEX) 0.12 % solution            cholecalciferol (VITAMIN D3) 25 MCG (1000 UNIT) tablet 1,000 Units  1,000 Units Oral Daily Buena Irish, MD   1,000 Units at 04/20/23 0838   citalopram (CELEXA) tablet 10 mg  10 mg Oral Daily Buena Irish, MD   10 mg at 04/20/23 4782   cyanocobalamin (VITAMIN B12) tablet 1,000 mcg  1,000 mcg Oral Daily Buena Irish, MD   1,000 mcg at 04/20/23 9562   feeding supplement (ENSURE ENLIVE / ENSURE PLUS) liquid 237 mL  237 mL Oral BID BM Buena Irish, MD   237 mL at 04/20/23 0839   fentaNYL (SUBLIMAZE) 100 MCG/2ML injection            fentaNYL (SUBLIMAZE) injection 12.5-25 mcg  12.5-25 mcg Intravenous Q3H PRN Sherlean Foot B, NP   12.5 mcg at 04/19/23 0916   heparin injection 5,000 Units  5,000 Units Subcutaneous Q8H Buena Irish, MD   5,000 Units at 04/21/23 0533   hydrALAZINE (APRESOLINE) injection 5 mg  5 mg Intravenous Q20 Min PRN Nada Libman, MD       ipratropium-albuterol (DUONEB) 0.5-2.5 (3) MG/3ML nebulizer solution 3 mL  3 mL Nebulization Q4H PRN Buena Irish, MD       labetalol (NORMODYNE) injection 10 mg  10 mg Intravenous Q10 min PRN Nada Libman, MD       levothyroxine (SYNTHROID) tablet 25 mcg  25 mcg Oral Q0600 Buena Irish, MD   25 mcg at 04/21/23 0534   melatonin tablet 6 mg  6 mg Oral QHS [place], Alvira Philips, DO   6 mg at 04/20/23 2241   multivitamin with minerals tablet 1 tablet  1 tablet Oral Daily Buena Irish, MD   1 tablet at 04/20/23 0839   ondansetron (ZOFRAN) tablet 4 mg  4 mg Oral Q6H PRN Buena Irish, MD       Or   ondansetron [facility]) injection 4 mg  4 mg Intravenous Q6H PRN Buena Irish, MD       senna-docusate (Senokot-S) tablet 1 tablet  1 tablet Oral QHS PRN Buena Irish, MD       sodium chloride flush (NS) 0.9 % injection 3  mL  3 mL Intravenous Q12H Nada Libman, MD   3 mL at 04/20/23 0839   sodium chloride flush (NS) 0.9 % injection 3 mL  3 mL Intravenous PRN Nada Libman, MD         Discharge Medications: Please see discharge summary for a list of discharge medications.  Relevant Imaging Results:  Relevant Lab Results:   Additional Information SSN [id number]. Needs wound vac.  Mearl Latin, LCSW

## 2023-04-21 NOTE — Progress Notes (Signed)
PROGRESS NOTE    Laura Wilcox  ZOX:[id number] DOB: 02/21/1927 DOA: 04/13/2023 PCP: Smiley Houseman, NP    Brief Narrative:   Laura Wilcox is a 89 y.o. female with past medical history significant for advanced dementia, polycythemia vera, chronic myelofibrosis on oral chemotherapy, hypothyroidism who presented from her memory care unit to [facility] ED on 1/12 with severe pain to her left foot.  Patient unable to complete dressing changes at SNF due to pain.  Recently discharged from hospital 2 days prior with basic wound care after a large hematoma was evacuated.  Since discharge, staff unable to change bandages due to her symptoms.  Patient has pending outpatient appointment with plastic surgery, Dr. Hester Mates on 04/15/2023.  In the ED, temperature 97.8 F, HR 81, RR 18, BP 163/72, SpO2 93% on room air.  WBC 12.7, hemoglobin 7.5, platelet count 595.  Sodium 137, potassium 4.3, chloride 102, CO2 23, glucose 88, BUN 24, Cram 1.04.  Left foot x-ray with no evidence for acute fracture, no subluxation or dislocation.  TRH consulted for admission.  Assessment & Plan:   Left lower extremity nonhealing wound with cellulitis PAD s/p right SFA stenting Patient presenting to ED with severe pain during dressing changes at SNF.  Recently admitted with large hematoma that was evacuated to the left ankle region.  Was to see plastic surgery outpatient on 04/15/2023 but given poor healing and worsening pain sought further care back in the ED.  Patient's left foot cool to touch and recent ABI showing severe PAD.  Vascular surgery was consulted and patient underwent angiogram with stenting of left SFA by Dr. Myra Gianotti on 04/15/2023.  Plastic surgery was consulted and patient underwent excision of left leg full-thickness wound/soft tissue and 1 cm tendon with placement of myriad and wound vac by Dr. Ulice Bold on 1/20. -- Vascular/plastic surgery following, appreciate assistance -- WBC  12.4>12.7>13.4>12.4>16.1>16.3>15.5 -- LDL 100, HDL 37 -- Ceftriaxone 1 g IV every 24 hours -- Tramadol 50 mg p.o. every 6 hours as needed moderate pain -- Fentanyl 12.5-25 mcg IV every 3 hours as needed severe pain, premedicate prior to PT/OT session -- Aspirin 81 mg p.o. daily -- Statin discontinued per request of son -- Plastic surgery following, plan wound debridement and possible wound VAC placement on 1/20, n.p.o. after midnight -- CBC daily  Advanced dementia Patient currently resides in memory care unit. -- Delirium precautions -- Get up during the day -- Encourage a familiar face to remain present throughout the day -- Keep blinds open and lights on during daylight hours -- Minimize the use of opioids/benzodiazepines -- Melatonin 3 mg p.o. nightly -- TOC consulted  Myelofibrosis -- Hold home Jakafi given active infection -- Outpatient follow-up with medical oncology  Hypothyroidism -- Levothyroxine 25 mcg p.o. daily  Anxiety/depression -- Celexa 10 mg p.o. daily  Normocytic anemia Anemia of chronic medical disease Anemia panel with iron 16, TIBC low at 231, ferritin 446, B12 1559 and folate greater than 40. -- Hgb 9.0>7.5>7.6>7.5>8.4>7.7>731 -- Type and screen -- Transfuse 1 unit PRBC today -- Repeat H&H after transfusion -- CBC daily  Weakness/debility/deconditioning Prior functional status, ambulatory with a walker. -- PT/OT recommend SNF placement -- Continue therapy efforts while inpatient -- TOC consulted for placement   DVT prophylaxis: heparin injection 5,000 Units Start: 04/13/23 2200    Code Status: Limited: Do not attempt resuscitation (DNR) -DNR-LIMITED -Do Not Intubate/DNI  Family Communication: No family present at bedside this morning, updated patient's daughter Laura Wilcox via telephone this morning  Disposition  Plan:  Level of care: Med-Surg Status is: Inpatient Remains inpatient appropriate because: Pending plastic surgery debridements of leg  wound planned for tomorrow, pending vascular surgery sign off and SNF placement    Consultants:  Vascular surgery Plastic surgery  Procedures:  Angiogram with stent to SFA, 1/14 excision of left leg full-thickness wound/soft tissue and 1 cm tendon with placement of myriad and wound vac; Dr. Ulice Bold 1/20.  Antimicrobials:  Ceftriaxone 1/12>> Vancomycin 1/12 - 1/12   Subjective: Patient seen examined bedside, resting calmly.  Lying in bed.  Sleeping but arousable.  Just returned from PACU following excision of soft tissue wound with placement of wound VAC.  Updated patient's daughter at bedside.  Remains pleasantly confused.  Reports some slight pain to left leg.  No other complaints at this time.  No acute events overnight per nursing staff.  Objective: Vitals:   04/20/23 2245 04/21/23 0815 04/21/23 0830 04/21/23 0845  BP: 112/68 129/61 (!) 125/58 (!) 123/58  Pulse: 87 70 69   Resp:  20 12 14   Temp: 98.5 F (36.9 C) (!) 97.3 F (36.3 C)  (!) 97.3 F (36.3 C)  TempSrc: Oral     SpO2: 92% 100% 96% 96%  Weight:      Height:        Intake/Output Summary (Last 24 hours) at 04/21/2023 1112 Last data filed at 04/21/2023 0805 Gross per 24 hour  Intake 1148.34 ml  Output 25 ml  Net 1123.34 ml   Filed Weights   04/13/23 1826  Weight: 52.8 kg    Examination:  Physical Exam: GEN: NAD, sleeping but arousable, pleasantly confused, chronically ill/elderly in appearance HEENT: NCAT, PERRL, EOMI, sclera clear PULM: CTAB w/o wheezes/crackles, normal respiratory effort, on room air with SpO2 98% at rest. CV: RRR w/o M/G/R GI: abd soft, NTND, NABS, no R/G/M MSK: no peripheral edema, moves all extremities independently, left lower extremity with dressing/Ace wrap in place, clean/dry/intact.   NEURO: No focal neurologic deficit PSYCH: Euthymic Integumentary: left lower extremity with surgical dressing/Ace wrap in place, clean/dry/intact, several areas of ecchymosis in various  stages of healing to extremities    Data Reviewed: I have personally reviewed following labs and imaging studies  CBC: Recent Labs  Lab 04/15/23 0632 04/16/23 0911 04/17/23 0312 04/20/23 0312 04/20/23 1946 04/21/23 0332  WBC 12.4* 16.1* 16.3* 15.5*  --  15.5*  HGB 7.5* 8.4* 7.7* 7.1* 9.1* 8.9*  HCT 24.6* 27.8* 25.5* 23.7* 28.9* 28.4*  MCV 84.8 87.1 85.3 84.6  --  84.0  PLT 670* 678* 715* 684*  --  648*   Basic Metabolic Panel: Recent Labs  Lab 04/15/23 0313 04/16/23 0911 04/20/23 0312 04/21/23 0332  NA 137 138 133* 135  K 4.2 4.8 4.2 4.7  CL 101 103 99 101  CO2 26 22 27 23   GLUCOSE 92 77 83 90  BUN 19 14 24* 23  CREATININE 1.10* 1.11* 1.11* 1.15*  CALCIUM 9.1 9.0 8.9 8.7*  MG  --  2.0 2.0  --   PHOS  --  4.4  --   --    GFR: Estimated Creatinine Clearance: 21.5 mL/min (A) (by C-G formula based on SCr of 1.15 mg/dL (H)). Liver Function Tests: No results for input(s): "AST", "ALT", "ALKPHOS", "BILITOT", "PROT", "ALBUMIN" in the last 168 hours. No results for input(s): "LIPASE", "AMYLASE" in the last 168 hours. No results for input(s): "AMMONIA" in the last 168 hours. Coagulation Profile: No results for input(s): "INR", "PROTIME" in the last 168 hours.  Cardiac Enzymes: No results for input(s): "CKTOTAL", "CKMB", "CKMBINDEX", "TROPONINI" in the last 168 hours. BNP (last 3 results) No results for input(s): "PROBNP" in the last 8760 hours. HbA1C: No results for input(s): "HGBA1C" in the last 72 hours. CBG: Recent Labs  Lab 04/15/23 1603 04/20/23 1620  GLUCAP 74 83   Lipid Profile: No results for input(s): "CHOL", "HDL", "LDLCALC", "TRIG", "CHOLHDL", "LDLDIRECT" in the last 72 hours.  Thyroid Function Tests: No results for input(s): "TSH", "T4TOTAL", "FREET4", "T3FREE", "THYROIDAB" in the last 72 hours. Anemia Panel: No results for input(s): "VITAMINB12", "FOLATE", "FERRITIN", "TIBC", "IRON", "RETICCTPCT" in the last 72 hours.  Sepsis Labs: No results for  input(s): "PROCALCITON", "LATICACIDVEN" in the last 168 hours.  No results found for this or any previous visit (from the past 240 hours).       Radiology Studies: No results found.       Scheduled Meds:  acetaminophen  500 mg Oral Q6H   aspirin EC  81 mg Oral Daily   calcitRIOL  0.25 mcg Oral Q M,W,F   chlorhexidine       cholecalciferol  1,000 Units Oral Daily   citalopram  10 mg Oral Daily   cyanocobalamin  1,000 mcg Oral Daily   feeding supplement  237 mL Oral BID BM   fentaNYL       heparin  5,000 Units Subcutaneous Q8H   levothyroxine  25 mcg Oral Q0600   melatonin  6 mg Oral QHS   multivitamin with minerals  1 tablet Oral Daily   sodium chloride flush  3 mL Intravenous Q12H   Continuous Infusions:  sodium chloride 75 mL/hr at 04/21/23 0726   cefTRIAXone (ROCEPHIN)  IV 1 g (04/20/23 1118)     LOS: 8 days    Time spent: 52 minutes spent on chart review, discussion with nursing staff, consultants, updating family and interview/physical exam; more than 50% of that time was spent in counseling and/or coordination of care.    Alvira Philips [place], DO [facility] Available via Epic secure chat 7am-7pm After these hours, please refer to coverage provider listed on [email or web address] 04/21/2023, 11:12 AM

## 2023-04-21 NOTE — TOC Progression Note (Signed)
Transition of Care [facility]) - Progression Note    Patient Details  Name: Laura Wilcox MRN: [id number] Date of Birth: 1927/01/04  [facility]) CM/SW Contact  Mearl Latin, LCSW Phone Number: 04/21/2023, 3:53 PM  Clinical Narrative:    CSW left voicemail for [facility] [phone]) and [facility] [phone]) as CSW unsure if they can offer patient a bed. CSW touched base with [facility] Cheree Ditto who stated it should not be a problem to obtain a wound vac for patient if family chooses them.    Expected Discharge Plan: Skilled Nursing Facility Barriers to Discharge: Continued Medical Work up, SNF Pending bed offer  Expected Discharge Plan and Services In-house Referral: Clinical Social Work     Living arrangements for the past 2 months: Assisted Living Facility                                       Social Determinants of Health (SDOH) Interventions SDOH Screenings   Food Insecurity: No Food Insecurity (04/13/2023)  Housing: Low Risk  (04/13/2023)  Transportation Needs: No Transportation Needs (04/13/2023)  Utilities: Not At Risk (04/13/2023)  Alcohol Screen: Low Risk  (06/18/2021)  Depression (PHQ2-9): High Risk (06/18/2021)  Social Connections: Unknown (04/13/2023)  Tobacco Use: Low Risk  (04/21/2023)    Readmission Risk Interventions    06/26/2021    4:26 PM  Readmission Risk Prevention Plan  Transportation Screening Complete  PCP or Specialist Appt within 5-7 Days Complete  Home Care Screening Complete  Medication Review (RN CM) Complete

## 2023-04-21 NOTE — Anesthesia Postprocedure Evaluation (Signed)
Anesthesia Post Note  Patient: Laura Wilcox  Procedure(s) Performed: IRRIGATION AND DEBRIDEMENT WOUND (Left: Ankle) APPLICATION OF SKIN SUBSTITUTE (Left: Ankle) APPLICATION OF WOUND VAC (Left)     Patient location during evaluation: PACU Anesthesia Type: General Level of consciousness: awake and alert Pain management: pain level controlled Vital Signs Assessment: post-procedure vital signs reviewed and stable Respiratory status: spontaneous breathing, nonlabored ventilation, respiratory function stable and patient connected to nasal cannula oxygen Cardiovascular status: blood pressure returned to baseline and stable Postop Assessment: no apparent nausea or vomiting Anesthetic complications: no   No notable events documented.  Last Vitals:  Vitals:   04/21/23 0845 04/21/23 1149  BP: (!) 123/58 (!) 127/53  Pulse:  65  Resp: 14 15  Temp: (!) 36.3 C (!) 36.3 C  SpO2: 96% 100%    Last Pain:  Vitals:   04/21/23 1200  TempSrc:   PainSc: 0-No pain                 Earl Lites P Rooney Gladwin

## 2023-04-21 NOTE — Interval H&P Note (Signed)
History and Physical Interval Note:  04/21/2023 7:06 AM  Glendale Chard  has presented today for surgery, with the diagnosis of ankle wound.  The various methods of treatment have been discussed with the patient and family. After consideration of risks, benefits and other options for treatment, the patient has consented to  Procedure(s): IRRIGATION AND DEBRIDEMENT WOUND (Left) APPLICATION OF SKIN SUBSTITUTE (Left) as a surgical intervention.  The patient's history has been reviewed, patient examined, no change in status, stable for surgery.  I have reviewed the patient's chart and labs.  Questions were answered to the patient's satisfaction.     [name] Valdis Bevill

## 2023-04-21 NOTE — Anesthesia Procedure Notes (Signed)
Procedure Name: LMA Insertion Date/Time: 04/21/2023 7:33 AM  Performed by: Kayleen Memos, CRNAPre-anesthesia Checklist: Patient identified, Emergency Drugs available, Suction available and Patient being monitored Patient Re-evaluated:Patient Re-evaluated prior to induction Oxygen Delivery Method: Circle System Utilized Preoxygenation: Pre-oxygenation with 100% oxygen Induction Type: IV induction Ventilation: Mask ventilation without difficulty LMA: LMA inserted LMA Size: 4.0 Number of attempts: 1 Placement Confirmation: positive ETCO2 Tube secured with: Tape Dental Injury: Teeth and Oropharynx as per pre-operative assessment

## 2023-04-21 NOTE — Plan of Care (Signed)
  Problem: Education: Goal: Knowledge of General Education information will improve Description Including pain rating scale, medication(s)/side effects and non-pharmacologic comfort measures Outcome: Progressing   Problem: Health Behavior/Discharge Planning: Goal: Ability to manage health-related needs will improve Outcome: Progressing   

## 2023-04-21 NOTE — Plan of Care (Signed)
  Problem: Clinical Measurements: Goal: Will remain free from infection Outcome: Progressing   Problem: Clinical Measurements: Goal: Cardiovascular complication will be avoided Outcome: Progressing   Problem: Clinical Measurements: Goal: Respiratory complications will improve Outcome: Progressing   Problem: Clinical Measurements: Goal: Will remain free from infection Outcome: Progressing

## 2023-04-21 NOTE — Op Note (Signed)
DATE OF OPERATION: 04/21/2023  LOCATION: [facility] Inpatient  PREOPERATIVE DIAGNOSIS: left leg full thickness wound  POSTOPERATIVE DIAGNOSIS: Same  PROCEDURE:  Excision of left leg full thickness wound 10 x 10 cm skin, soft tissue and 1 cm tendon. Placement of Myriad 10 x 10 cm sheet and 1 gm powder Placement of negative pressure wound VAC  SURGEON: Foster Simpson, DO  ASSISTANT: Keenan Bachelor, PA  EBL: none  CONDITION: Stable  COMPLICATIONS: None  INDICATION: The patient, Laura Wilcox, is a 89 y.o. female born on 1926-07-19, is here for treatment of a left leg wound after a fall and a hematoma.  The area became infected and resulted in a 10 x 10 cm wound full thickness.   PROCEDURE DETAILS:  The patient was seen prior to surgery and marked.  The IV antibiotics were given. The patient was taken to the operating room and given a general anesthetic. A standard time out was performed and all information was confirmed by those in the room. SCD was placed on right leg.  The patient was prepped and draped.  The leg was irrigated with saline and Vashe.  The #10 blade and curette were used to excise the skin and soft tissue of the wound that was non-viable.  The area of 1 cm non-viable tendon was shaved with the #10 blade.  All of the myriad powder and sheet was applied and secured with the 5-0 Vicryl.  The sorbact was placed over it and then the KY gel and VAC.  There was an excellent seal of the VAC.  The leg was wrapped with kerlex and an ace wrap.  The patient was allowed to wake up and taken to recovery room in stable condition at the end of the case. The family was notified at the end of the case.   The advanced practice practitioner (APP) assisted throughout the case.  The APP was essential in retraction and counter traction when needed to make the case progress smoothly.  This retraction and assistance made it possible to see the tissue plans for the procedure.  The  assistance was needed for blood control, tissue re-approximation and assisted with closure of the incision site.

## 2023-04-22 ENCOUNTER — Other Ambulatory Visit (HOSPITAL_COMMUNITY): Payer: Self-pay

## 2023-04-22 ENCOUNTER — Encounter (HOSPITAL_COMMUNITY): Payer: Self-pay | Admitting: Plastic Surgery

## 2023-04-22 ENCOUNTER — Ambulatory Visit: Payer: PRIVATE HEALTH INSURANCE | Admitting: Dermatology

## 2023-04-22 DIAGNOSIS — T148XXA Other injury of unspecified body region, initial encounter: Secondary | ICD-10-CM | POA: Diagnosis not present

## 2023-04-22 DIAGNOSIS — I70222 Atherosclerosis of native arteries of extremities with rest pain, left leg: Secondary | ICD-10-CM | POA: Diagnosis not present

## 2023-04-22 LAB — CBC
HCT: 29.2 % — ABNORMAL LOW (ref 36.0–46.0)
Hemoglobin: 9 g/dL — ABNORMAL LOW (ref 12.0–15.0)
MCH: 26.7 pg (ref 26.0–34.0)
MCHC: 30.8 g/dL (ref 30.0–36.0)
MCV: 86.6 fL (ref 80.0–100.0)
Platelets: 622 10*3/uL — ABNORMAL HIGH (ref 150–400)
RBC: 3.37 MIL/uL — ABNORMAL LOW (ref 3.87–5.11)
RDW: 26 % — ABNORMAL HIGH (ref 11.5–15.5)
WBC: 12.7 10*3/uL — ABNORMAL HIGH (ref 4.0–10.5)
nRBC: 0.7 % — ABNORMAL HIGH (ref 0.0–0.2)

## 2023-04-22 LAB — BASIC METABOLIC PANEL
Anion gap: 7 (ref 5–15)
BUN: 20 mg/dL (ref 8–23)
CO2: 27 mmol/L (ref 22–32)
Calcium: 9 mg/dL (ref 8.9–10.3)
Chloride: 101 mmol/L (ref 98–111)
Creatinine, Ser: 1.09 mg/dL — ABNORMAL HIGH (ref 0.44–1.00)
GFR, Estimated: 47 mL/min — ABNORMAL LOW (ref >60)
Glucose, Bld: 78 mg/dL (ref 70–99)
Potassium: 4.9 mmol/L (ref 3.5–5.1)
Sodium: 135 mmol/L (ref 135–145)

## 2023-04-22 LAB — GLUCOSE, CAPILLARY: Glucose-Capillary: 99 mg/dL (ref 70–99)

## 2023-04-22 MED ORDER — DIPHENHYDRAMINE HCL 25 MG PO CAPS
25.0000 mg | ORAL_CAPSULE | Freq: Once | ORAL | Status: AC | PRN
  Administered 2023-04-22: 25 mg via ORAL
  Filled 2023-04-22: qty 1

## 2023-04-22 NOTE — TOC Progression Note (Signed)
Transition of Care [facility]) - Progression Note    Patient Details  Name: Armita Waag MRN: [id number] Date of Birth: October 19, 1926  [facility]) CM/SW Contact  Eduard Roux, [place] Phone Number: 04/22/2023, 3:05 PM  Clinical Narrative:    9:42 am - received VM from patient's daughter, family wants [facility].   3:08- contacted [facility]- left voice message, family decided on their facility.- waiting on call back  TOC will continue to follow and assist with discharge planning.  Antony Blackbird, MSW, LCSW Clinical Social Worker    Expected Discharge Plan: Skilled Nursing Facility Barriers to Discharge: Continued Medical Work up, SNF Pending bed offer  Expected Discharge Plan and Services In-house Referral: Clinical Social Work     Living arrangements for the past 2 months: Assisted Living Facility                                       Social Determinants of Health (SDOH) Interventions SDOH Screenings   Food Insecurity: No Food Insecurity (04/13/2023)  Housing: Low Risk  (04/13/2023)  Transportation Needs: No Transportation Needs (04/13/2023)  Utilities: Not At Risk (04/13/2023)  Alcohol Screen: Low Risk  (06/18/2021)  Depression (PHQ2-9): High Risk (06/18/2021)  Social Connections: Unknown (04/13/2023)  Tobacco Use: Low Risk  (04/21/2023)    Readmission Risk Interventions    06/26/2021    4:26 PM  Readmission Risk Prevention Plan  Transportation Screening Complete  PCP or Specialist Appt within 5-7 Days Complete  Home Care Screening Complete  Medication Review (RN CM) Complete

## 2023-04-22 NOTE — Progress Notes (Addendum)
PROGRESS NOTE    Laura Wilcox  MWN:[id number] DOB: 09/06/1926 DOA: 04/13/2023 PCP: Smiley Houseman, NP    Brief Narrative:   Laura Wilcox is a 89 y.o. female with past medical history significant for advanced dementia, polycythemia vera, chronic myelofibrosis on oral chemotherapy, hypothyroidism who presented from her memory care unit to [facility] ED on 1/12 with severe pain to her left foot.  Patient unable to complete dressing changes at SNF due to pain.  Recently discharged from hospital 2 days prior with basic wound care after a large hematoma was evacuated.  Since discharge, staff unable to change bandages due to her symptoms.  Patient has pending outpatient appointment with plastic surgery, Dr. Hester Mates on 04/15/2023.  In the ED, temperature 97.8 F, HR 81, RR 18, BP 163/72, SpO2 93% on room air.  WBC 12.7, hemoglobin 7.5, platelet count 595.  Sodium 137, potassium 4.3, chloride 102, CO2 23, glucose 88, BUN 24, Cram 1.04.  Left foot x-ray with no evidence for acute fracture, no subluxation or dislocation.  TRH consulted for admission.  Assessment & Plan:   Left lower extremity nonhealing wound with cellulitis PAD s/p right SFA stenting Patient presenting to ED with severe pain during dressing changes at SNF.  Recently admitted with large hematoma that was evacuated to the left ankle region.  Was to see plastic surgery outpatient on 04/15/2023 but given poor healing and worsening pain sought further care back in the ED.  Patient's left foot cool to touch and recent ABI showing severe PAD.  Vascular surgery was consulted and patient underwent angiogram with stenting of left SFA by Dr. Myra Gianotti on 04/15/2023.  Plastic surgery was consulted and patient underwent excision of left leg full-thickness wound/soft tissue and 1 cm tendon with placement of myriad and wound vac by Dr. Ulice Bold on 1/20. -- Vascular/plastic surgery following, appreciate assistance -- WBC  12.4>12.7>13.4>12.4>16.1>16.3>15.5>12.7 -- LDL 100, HDL 37 -- Ceftriaxone 1 g IV every 24 hours -- Tramadol 50 mg p.o. every 6 hours as needed moderate pain -- Fentanyl 12.5-25 mcg IV every 3 hours as needed severe pain, premedicate prior to PT/OT session -- Aspirin 81 mg p.o. daily -- Statin discontinued per request of son  Advanced dementia Patient currently resides in memory care unit. -- Delirium precautions -- Get up during the day -- Encourage a familiar face to remain present throughout the day -- Keep blinds open and lights on during daylight hours -- Minimize the use of opioids/benzodiazepines -- Melatonin 3 mg p.o. nightly -- TOC consulted  Myelofibrosis -- Hold home Jakafi given active infection -- Outpatient follow-up with medical oncology  Hypothyroidism -- Levothyroxine 25 mcg p.o. daily  Anxiety/depression -- Celexa 10 mg p.o. daily  Normocytic anemia Anemia of chronic medical disease Anemia panel with iron 16, TIBC low at 231, ferritin 446, B12 1559 and folate greater than 40.  Transfuse 1 unit PRBC on 1/19 -- Hgb 9.0>7.5>7.6>7.5>8.4>7.7>>7.1>9.1>>9.0 -- Continue intermittent monitoring of CBC  Weakness/debility/deconditioning Prior functional status, ambulatory with a walker. -- PT/OT recommend SNF placement -- Continue therapy efforts while inpatient -- TOC consulted for placement   DVT prophylaxis: heparin injection 5,000 Units Start: 04/13/23 2200    Code Status: Limited: Do not attempt resuscitation (DNR) -DNR-LIMITED -Do Not Intubate/DNI  Family Communication: No family present at bedside this morning  Disposition Plan:  Level of care: Med-Surg Status is: Inpatient Remains inpatient appropriate because: Pending SNF placement    Consultants:  Vascular surgery Plastic surgery  Procedures:  Angiogram with stent to SFA,  1/14 excision of left leg full-thickness wound/soft tissue and 1 cm tendon with placement of myriad and wound vac; Dr.  Ulice Bold 1/20.  Antimicrobials:  Ceftriaxone 1/12>> Vancomycin 1/12 - 1/12   Subjective: Patient seen examined bedside, resting calmly.  Lying in bed.  Awake, pleasantly confused.  Complaining of some mild pain to her left leg.  No family present.  No other complaints at this time.  Denies headache, no chest pain, no shortness of breath, no fever.    No acute events overnight per nursing staff.  Objective: Vitals:   04/21/23 2331 04/22/23 0342 04/22/23 0744 04/22/23 1137  BP: 136/61 135/64 119/61 97/77  Pulse: 70 72 73   Resp: 16 16 14 15   Temp: (!) 97 F (36.1 C) (!) 97.5 F (36.4 C) (!) 97.4 F (36.3 C) 97.7 F (36.5 C)  TempSrc: Oral Oral Oral Oral  SpO2: 100% 100% 97%   Weight:      Height:        Intake/Output Summary (Last 24 hours) at 04/22/2023 1240 Last data filed at 04/22/2023 0343 Gross per 24 hour  Intake --  Output 850 ml  Net -850 ml   Filed Weights   04/13/23 1826  Weight: 52.8 kg    Examination:  Physical Exam: GEN: NAD, sleeping but arousable, pleasantly confused, chronically ill/elderly in appearance HEENT: NCAT, PERRL, EOMI, sclera clear PULM: CTAB w/o wheezes/crackles, normal respiratory effort, on room air with SpO2 98% at rest. CV: RRR w/o M/G/R GI: abd soft, NTND, NABS, no R/G/M MSK: no peripheral edema, moves all extremities independently, left lower extremity with dressing/Ace wrap in place, clean/dry/intact.   NEURO: No focal neurologic deficit PSYCH: Euthymic Integumentary: left lower extremity with surgical dressing/Ace wrap in place, clean/dry/intact, several areas of ecchymosis in various stages of healing to extremities    Data Reviewed: I have personally reviewed following labs and imaging studies  CBC: Recent Labs  Lab 04/16/23 0911 04/17/23 0312 04/20/23 0312 04/20/23 1946 04/21/23 0332 04/22/23 0320  WBC 16.1* 16.3* 15.5*  --  15.5* 12.7*  HGB 8.4* 7.7* 7.1* 9.1* 8.9* 9.0*  HCT 27.8* 25.5* 23.7* 28.9* 28.4* 29.2*   MCV 87.1 85.3 84.6  --  84.0 86.6  PLT 678* 715* 684*  --  648* 622*   Basic Metabolic Panel: Recent Labs  Lab 04/16/23 0911 04/20/23 0312 04/21/23 0332 04/22/23 0320  NA 138 133* 135 135  K 4.8 4.2 4.7 4.9  CL 103 99 101 101  CO2 22 27 23 27   GLUCOSE 77 83 90 78  BUN 14 24* 23 20  CREATININE 1.11* 1.11* 1.15* 1.09*  CALCIUM 9.0 8.9 8.7* 9.0  MG 2.0 2.0  --   --   PHOS 4.4  --   --   --    GFR: Estimated Creatinine Clearance: 22.7 mL/min (A) (by C-G formula based on SCr of 1.09 mg/dL (H)). Liver Function Tests: No results for input(s): "AST", "ALT", "ALKPHOS", "BILITOT", "PROT", "ALBUMIN" in the last 168 hours. No results for input(s): "LIPASE", "AMYLASE" in the last 168 hours. No results for input(s): "AMMONIA" in the last 168 hours. Coagulation Profile: No results for input(s): "INR", "PROTIME" in the last 168 hours. Cardiac Enzymes: No results for input(s): "CKTOTAL", "CKMB", "CKMBINDEX", "TROPONINI" in the last 168 hours. BNP (last 3 results) No results for input(s): "PROBNP" in the last 8760 hours. HbA1C: No results for input(s): "HGBA1C" in the last 72 hours. CBG: Recent Labs  Lab 04/15/23 1603 04/20/23 1620 04/22/23 1139  GLUCAP  74 83 99   Lipid Profile: No results for input(s): "CHOL", "HDL", "LDLCALC", "TRIG", "CHOLHDL", "LDLDIRECT" in the last 72 hours.  Thyroid Function Tests: No results for input(s): "TSH", "T4TOTAL", "FREET4", "T3FREE", "THYROIDAB" in the last 72 hours. Anemia Panel: No results for input(s): "VITAMINB12", "FOLATE", "FERRITIN", "TIBC", "IRON", "RETICCTPCT" in the last 72 hours.  Sepsis Labs: No results for input(s): "PROCALCITON", "LATICACIDVEN" in the last 168 hours.  No results found for this or any previous visit (from the past 240 hours).       Radiology Studies: No results found.       Scheduled Meds:  acetaminophen  500 mg Oral Q6H   aspirin EC  81 mg Oral Daily   calcitRIOL  0.25 mcg Oral Q M,W,F    cholecalciferol  1,000 Units Oral Daily   citalopram  10 mg Oral Daily   cyanocobalamin  1,000 mcg Oral Daily   feeding supplement  237 mL Oral BID BM   heparin  5,000 Units Subcutaneous Q8H   levothyroxine  25 mcg Oral Q0600   melatonin  6 mg Oral QHS   multivitamin with minerals  1 tablet Oral Daily   sodium chloride flush  3 mL Intravenous Q12H   Continuous Infusions:  cefTRIAXone (ROCEPHIN)  IV 1 g (04/21/23 1235)     LOS: 9 days    Time spent: 52 minutes spent on chart review, discussion with nursing staff, consultants, updating family and interview/physical exam; more than 50% of that time was spent in counseling and/or coordination of care.    Alvira Philips [place], DO [facility] Available via Epic secure chat 7am-7pm After these hours, please refer to coverage provider listed on [email or web address] 04/22/2023, 12:40 PM

## 2023-04-22 NOTE — Progress Notes (Signed)
Physical Therapy Treatment Patient Details Name: Laura Wilcox MRN: [id number] DOB: 13-Aug-1926 Today's Date: 04/22/2023   History of Present Illness 89 y.o. female sent in from her memory care unit 1/12 secondary to pain in L foot/ankle. Vascular consulted and recommended transfer to [facility] for lower extremity angiogram. Now s/p LLE angiogram and stent L superficial popliteal artery 1/14.  Underwent L LE excisional debridement and placement of myriad and wound vac on 04/21/23.  PMH: advanced dementia, polycythemia vera, chronic myelofibrosis on oral chemotherapy, L ankle hematoma and laceration/fall 04/2023, s/p I&D L ankle 04/02/2023 at [facility].    PT Comments  Patient with limited progress due to another surgery on L foot yesterday, though still able to mobilize up OOB to chair with encouragement.  She tolerated some weight on L foot after pre-med and encouragement.  PT will continue to follow.     If plan is discharge home, recommend the following: Two people to help with walking and/or transfers;Help with stairs or ramp for entrance;Assist for transportation;Assistance with cooking/housework;A lot of help with bathing/dressing/bathroom   Can travel by private vehicle     No  Equipment Recommendations  None recommended by PT    Recommendations for Other Services       Precautions / Restrictions Precautions Precautions: Fall Precaution Comments: L LE wound vac     Mobility  Bed Mobility Overal bed mobility: Needs Assistance Bed Mobility: Supine to Sit     Supine to sit: HOB elevated, Mod assist     General bed mobility comments: help to initiate moving legs, scooting hips    Transfers Overall transfer level: Needs assistance Equipment used: Rolling walker (2 wheels) Transfers: Sit to/from Stand, Bed to chair/wheelchair/BSC Sit to Stand: From elevated surface, Min assist, +2 physical assistance   Step pivot transfers: +2 physical assistance, Min assist       General  transfer comment: stepping with RW to recliner with A for balance and safety pt incontinent of urine despite purwick    Ambulation/Gait                   Stairs             Wheelchair Mobility     Tilt Bed    Modified Rankin (Stroke Patients Only)       Balance Overall balance assessment: Needs assistance   Sitting balance-Leahy Scale: Fair     Standing balance support: Bilateral upper extremity supported Standing balance-Leahy Scale: Poor Standing balance comment: difficulty tolerating weight on L foot                            Cognition Arousal: Alert Behavior During Therapy: Agitated Overall Cognitive Status: History of cognitive impairments - at baseline                                 General Comments: initially sleeping and not wanting to move, but able to be alert and participate well with encouragement        Exercises      General Comments General comments (skin integrity, edema, etc.): VSS on RA      Pertinent Vitals/Pain Pain Assessment Pain Assessment: Faces Faces Pain Scale: Hurts whole lot Pain Location: L foot with weight bearing Pain Descriptors / Indicators: Discomfort, Grimacing, Guarding Pain Intervention(s): Monitored during session, Premedicated before session, Repositioned    Home Living  Prior Function            PT Goals (current goals can now be found in the care plan section) Progress towards PT goals: Not progressing toward goals - comment (due to recent procedure)    Frequency    Min 1X/week      PT Plan      Co-evaluation              AM-PAC PT "6 Clicks" Mobility   Outcome Measure  Help needed turning from your back to your side while in a flat bed without using bedrails?: A Little Help needed moving from lying on your back to sitting on the side of a flat bed without using bedrails?: A Little Help needed moving to and from a  bed to a chair (including a wheelchair)?: Total Help needed standing up from a chair using your arms (e.g., wheelchair or bedside chair)?: Total Help needed to walk in hospital room?: Total Help needed climbing 3-5 steps with a railing? : Total 6 Click Score: 10    End of Session Equipment Utilized During Treatment: Gait belt;Other (comment) (wound vac) Activity Tolerance: Patient limited by pain Patient left: in chair;with call bell/phone within reach;with chair alarm set Nurse Communication: Mobility status PT Visit Diagnosis: Other abnormalities of gait and mobility (R26.89);Muscle weakness (generalized) (M62.81);Difficulty in walking, not elsewhere classified (R26.2)     Time: 1324-4010 PT Time Calculation (min) (ACUTE ONLY): 19 min  Charges:    $Therapeutic Activity: 8-22 mins PT General Charges $$ ACUTE PT VISIT: 1 Visit                     Sheran Lawless, PT Acute Rehabilitation Services [phone] 04/22/2023    Elray Mcgregor 04/22/2023, 6:05 PM

## 2023-04-23 DIAGNOSIS — I70222 Atherosclerosis of native arteries of extremities with rest pain, left leg: Secondary | ICD-10-CM | POA: Diagnosis not present

## 2023-04-23 DIAGNOSIS — T148XXA Other injury of unspecified body region, initial encounter: Secondary | ICD-10-CM | POA: Diagnosis not present

## 2023-04-23 DIAGNOSIS — L03116 Cellulitis of left lower limb: Secondary | ICD-10-CM | POA: Diagnosis not present

## 2023-04-23 DIAGNOSIS — S81802A Unspecified open wound, left lower leg, initial encounter: Secondary | ICD-10-CM | POA: Diagnosis not present

## 2023-04-23 NOTE — TOC Progression Note (Signed)
Transition of Care [facility]) - Progression Note    Patient Details  Name: Laura Wilcox MRN: [id number] Date of Birth: Aug 15, 1926  [facility] of [facility] CM/SW Contact  Eduard Roux, [place] Phone Number: 04/23/2023, 3:50 PM  Clinical Narrative:     CSW informed [facility] of anticipated d/c tomorrow, per MD -  Almira Coaster, states SNF will order wound vac and will inform CSW once it arrives.   Antony Blackbird, MSW, LCSW Clinical Social Worker    Expected Discharge Plan: Skilled Nursing Facility Barriers to Discharge: Continued Medical Work up, SNF Pending bed offer  Expected Discharge Plan and Services In-house Referral: Clinical Social Work     Living arrangements for the past 2 months: Assisted Living Facility                                       Social Determinants of Health (SDOH) Interventions SDOH Screenings   Food Insecurity: No Food Insecurity (04/13/2023)  Housing: Low Risk  (04/13/2023)  Transportation Needs: No Transportation Needs (04/13/2023)  Utilities: Not At Risk (04/13/2023)  Alcohol Screen: Low Risk  (06/18/2021)  Depression (PHQ2-9): High Risk (06/18/2021)  Social Connections: Unknown (04/13/2023)  Tobacco Use: Low Risk  (04/21/2023)    Readmission Risk Interventions    06/26/2021    4:26 PM  Readmission Risk Prevention Plan  Transportation Screening Complete  PCP or Specialist Appt within 5-7 Days Complete  Home Care Screening Complete  Medication Review (RN CM) Complete

## 2023-04-23 NOTE — Progress Notes (Signed)
PROGRESS NOTE    Jamise Nowack  ZOX:[id number] DOB: 1926/12/18 DOA: 04/13/2023 PCP: Smiley Houseman, NP    Brief Narrative:   Laura Wilcox is a 89 y.o. female with past medical history significant for advanced dementia, polycythemia vera, chronic myelofibrosis on oral chemotherapy, hypothyroidism who presented from her memory care unit to [facility] ED on 1/12 with severe pain to her left foot.  Patient unable to complete dressing changes at SNF due to pain.  Recently discharged from hospital 2 days prior with basic wound care after a large hematoma was evacuated.  Since discharge, staff unable to change bandages due to her symptoms.  Patient has pending outpatient appointment with plastic surgery, Dr. Hester Mates on 04/15/2023.  In the ED, temperature 97.8 F, HR 81, RR 18, BP 163/72, SpO2 93% on room air.  WBC 12.7, hemoglobin 7.5, platelet count 595.  Sodium 137, potassium 4.3, chloride 102, CO2 23, glucose 88, BUN 24, Cram 1.04.  Left foot x-ray with no evidence for acute fracture, no subluxation or dislocation.  TRH consulted for admission.  Assessment & Plan:   Left lower extremity nonhealing wound with cellulitis PAD s/p right SFA stenting Patient presenting to ED with severe pain during dressing changes at SNF.  Recently admitted with large hematoma that was evacuated to the left ankle region.  Was to see plastic surgery outpatient on 04/15/2023 but given poor healing and worsening pain sought further care back in the ED.  Patient's left foot cool to touch and recent ABI showing severe PAD.  Vascular surgery was consulted and patient underwent angiogram with stenting of left SFA by Dr. Myra Gianotti on 04/15/2023.  Plastic surgery was consulted and patient underwent excision of left leg full-thickness wound/soft tissue and 1 cm tendon with placement of myriad and wound vac by Dr. Ulice Bold on 1/20. -- Vascular/plastic surgery following, appreciate assistance -- WBC  12.4>12.7>13.4>12.4>16.1>16.3>15.5>12.7 -- LDL 100, HDL 37 -- Ceftriaxone 1 g IV every 24 hours x 10 days -- Tramadol 50 mg p.o. every 6 hours as needed moderate pain -- Fentanyl 12.5-25 mcg IV every 3 hours as needed severe pain, premedicate prior to PT/OT session -- Aspirin 81 mg p.o. daily -- Statin discontinued per request of son -- Continue wound VAC, further per plastic/vascular surgery  Advanced dementia Patient currently resides in memory care unit. -- Delirium precautions -- Get up during the day -- Encourage a familiar face to remain present throughout the day -- Keep blinds open and lights on during daylight hours -- Minimize the use of opioids/benzodiazepines -- Melatonin 3 mg p.o. nightly -- TOC consulted  Myelofibrosis -- Hold home Jakafi given active infection -- Outpatient follow-up with medical oncology  Hypothyroidism -- Levothyroxine 25 mcg p.o. daily  Anxiety/depression -- Celexa 10 mg p.o. daily  Normocytic anemia Anemia of chronic medical disease Anemia panel with iron 16, TIBC low at 231, ferritin 446, B12 1559 and folate greater than 40.  Transfuse 1 unit PRBC on 1/19 -- Hgb 9.0>7.5>7.6>7.5>8.4>7.7>>7.1>9.1>>9.0 -- Continue intermittent monitoring of CBC  Weakness/debility/deconditioning Prior functional status, ambulatory with a walker. -- PT/OT recommend SNF placement -- Continue therapy efforts while inpatient -- TOC consulted for placement   DVT prophylaxis: heparin injection 5,000 Units Start: 04/13/23 2200    Code Status: Limited: Do not attempt resuscitation (DNR) -DNR-LIMITED -Do Not Intubate/DNI  Family Communication: No family present at bedside this morning  Disposition Plan:  Level of care: Med-Surg Status is: Inpatient Remains inpatient appropriate because: Pending SNF placement    Consultants:  Vascular  surgery Plastic surgery  Procedures:  Angiogram with stent to SFA, 1/14 excision of left leg full-thickness wound/soft  tissue and 1 cm tendon with placement of myriad and wound vac; Dr. Ulice Bold 1/20.  Antimicrobials:  Ceftriaxone 1/12>> Vancomycin 1/12 - 1/12   Subjective: Patient seen examined bedside, resting calmly.  Lying in bed.  Awake, pleasantly confused. No family present.  No other complaints at this time.  Denies headache, no chest pain, no shortness of breath, no abdominal pain, no fever.    No acute events overnight per nursing staff.  Objective: Vitals:   04/22/23 1957 04/23/23 0016 04/23/23 0334 04/23/23 0832  BP: 121/71 (!) 102/49 (!) 121/54 (!) 118/56  Pulse:  84 84   Resp: 18 15 18    Temp: (!) 97.4 F (36.3 C) 98.6 F (37 C) 97.8 F (36.6 C) 97.9 F (36.6 C)  TempSrc: Oral Oral Oral Oral  SpO2: 98% 92% 92%   Weight:      Height:        Intake/Output Summary (Last 24 hours) at 04/23/2023 1134 Last data filed at 04/23/2023 6440 Gross per 24 hour  Intake --  Output 950 ml  Net -950 ml   Filed Weights   04/13/23 1826  Weight: 52.8 kg    Examination:  Physical Exam: GEN: NAD, sleeping but arousable, pleasantly confused, chronically ill/elderly in appearance HEENT: NCAT, PERRL, EOMI, sclera clear PULM: CTAB w/o wheezes/crackles, normal respiratory effort, on room air with SpO2 98% at rest. CV: RRR w/o M/G/R GI: abd soft, NTND, NABS, no R/G/M MSK: no peripheral edema, moves all extremities independently, left lower extremity with dressing/Ace wrap and wound VAC in place, clean/dry/intact.   NEURO: No focal neurologic deficit PSYCH: Euthymic Integumentary: left lower extremity with surgical dressing/Ace wrap; wound VAC in place, clean/dry/intact, several areas of ecchymosis in various stages of healing to extremities    Data Reviewed: I have personally reviewed following labs and imaging studies  CBC: Recent Labs  Lab 04/17/23 0312 04/20/23 0312 04/20/23 1946 04/21/23 0332 04/22/23 0320  WBC 16.3* 15.5*  --  15.5* 12.7*  HGB 7.7* 7.1* 9.1* 8.9* 9.0*  HCT  25.5* 23.7* 28.9* 28.4* 29.2*  MCV 85.3 84.6  --  84.0 86.6  PLT 715* 684*  --  648* 622*   Basic Metabolic Panel: Recent Labs  Lab 04/20/23 0312 04/21/23 0332 04/22/23 0320  NA 133* 135 135  K 4.2 4.7 4.9  CL 99 101 101  CO2 27 23 27   GLUCOSE 83 90 78  BUN 24* 23 20  CREATININE 1.11* 1.15* 1.09*  CALCIUM 8.9 8.7* 9.0  MG 2.0  --   --    GFR: Estimated Creatinine Clearance: 22.7 mL/min (A) (by C-G formula based on SCr of 1.09 mg/dL (H)). Liver Function Tests: No results for input(s): "AST", "ALT", "ALKPHOS", "BILITOT", "PROT", "ALBUMIN" in the last 168 hours. No results for input(s): "LIPASE", "AMYLASE" in the last 168 hours. No results for input(s): "AMMONIA" in the last 168 hours. Coagulation Profile: No results for input(s): "INR", "PROTIME" in the last 168 hours. Cardiac Enzymes: No results for input(s): "CKTOTAL", "CKMB", "CKMBINDEX", "TROPONINI" in the last 168 hours. BNP (last 3 results) No results for input(s): "PROBNP" in the last 8760 hours. HbA1C: No results for input(s): "HGBA1C" in the last 72 hours. CBG: Recent Labs  Lab 04/20/23 1620 04/22/23 1139  GLUCAP 83 99   Lipid Profile: No results for input(s): "CHOL", "HDL", "LDLCALC", "TRIG", "CHOLHDL", "LDLDIRECT" in the last 72 hours.  Thyroid  Function Tests: No results for input(s): "TSH", "T4TOTAL", "FREET4", "T3FREE", "THYROIDAB" in the last 72 hours. Anemia Panel: No results for input(s): "VITAMINB12", "FOLATE", "FERRITIN", "TIBC", "IRON", "RETICCTPCT" in the last 72 hours.  Sepsis Labs: No results for input(s): "PROCALCITON", "LATICACIDVEN" in the last 168 hours.  No results found for this or any previous visit (from the past 240 hours).       Radiology Studies: No results found.       Scheduled Meds:  acetaminophen  500 mg Oral Q6H   aspirin EC  81 mg Oral Daily   calcitRIOL  0.25 mcg Oral Q M,W,F   cholecalciferol  1,000 Units Oral Daily   citalopram  10 mg Oral Daily    cyanocobalamin  1,000 mcg Oral Daily   feeding supplement  237 mL Oral BID BM   heparin  5,000 Units Subcutaneous Q8H   levothyroxine  25 mcg Oral Q0600   melatonin  6 mg Oral QHS   multivitamin with minerals  1 tablet Oral Daily   sodium chloride flush  3 mL Intravenous Q12H   Continuous Infusions:  cefTRIAXone (ROCEPHIN)  IV 1 g (04/22/23 1333)     LOS: 10 days    Time spent: 52 minutes spent on chart review, discussion with nursing staff, consultants, updating family and interview/physical exam; more than 50% of that time was spent in counseling and/or coordination of care.    Alvira Philips [place], DO [facility] Available via Epic secure chat 7am-7pm After these hours, please refer to coverage provider listed on [email or web address] 04/23/2023, 11:34 AM

## 2023-04-23 NOTE — TOC Progression Note (Signed)
Transition of Care [facility]) - Progression Note    Patient Details  Name: Laura Wilcox MRN: [id number] Date of Birth: May 24, 1926  Transition of Care [facility]) CM/SW Contact  Eduard Roux, [place] Phone Number: 04/23/2023, 2:53 PM  Clinical Narrative:     Contacted [facility], informed, family has accepted bed offer.   CSW will continue to follow and assist with discharge planning.  Antony Blackbird, MSW, LCSW Clinical Social Worker    Expected Discharge Plan: Skilled Nursing Facility Barriers to Discharge: Continued Medical Work up, SNF Pending bed offer  Expected Discharge Plan and Services In-house Referral: Clinical Social Work     Living arrangements for the past 2 months: Assisted Living Facility                                       Social Determinants of Health (SDOH) Interventions SDOH Screenings   Food Insecurity: No Food Insecurity (04/13/2023)  Housing: Low Risk  (04/13/2023)  Transportation Needs: No Transportation Needs (04/13/2023)  Utilities: Not At Risk (04/13/2023)  Alcohol Screen: Low Risk  (06/18/2021)  Depression (PHQ2-9): High Risk (06/18/2021)  Social Connections: Unknown (04/13/2023)  Tobacco Use: Low Risk  (04/21/2023)    Readmission Risk Interventions    06/26/2021    4:26 PM  Readmission Risk Prevention Plan  Transportation Screening Complete  PCP or Specialist Appt within 5-7 Days Complete  Home Care Screening Complete  Medication Review (RN CM) Complete

## 2023-04-23 NOTE — Plan of Care (Signed)
  Problem: Education: Goal: Knowledge of General Education information will improve Description: Including pain rating scale, medication(s)/side effects and non-pharmacologic comfort measures Outcome: Progressing   Problem: Health Behavior/Discharge Planning: Goal: Ability to manage health-related needs will improve Outcome: Progressing   Problem: Clinical Measurements: Goal: Ability to maintain clinical measurements within normal limits will improve Outcome: Progressing Goal: Will remain free from infection Outcome: Progressing Goal: Diagnostic test results will improve Outcome: Progressing Goal: Respiratory complications will improve Outcome: Progressing Goal: Cardiovascular complication will be avoided Outcome: Progressing   Problem: Activity: Goal: Risk for activity intolerance will decrease Outcome: Progressing   Problem: Nutrition: Goal: Adequate nutrition will be maintained Outcome: Progressing   Problem: Coping: Goal: Level of anxiety will decrease Outcome: Progressing   Problem: Elimination: Goal: Will not experience complications related to bowel motility Outcome: Progressing Goal: Will not experience complications related to urinary retention Outcome: Progressing   Problem: Pain Management: Goal: General experience of comfort will improve Outcome: Progressing   Problem: Safety: Goal: Ability to remain free from injury will improve Outcome: Progressing   Problem: Skin Integrity: Goal: Risk for impaired skin integrity will decrease Outcome: Progressing   Problem: Education: Goal: Understanding of CV disease, CV risk reduction, and recovery process will improve Outcome: Progressing   Problem: Activity: Goal: Ability to return to baseline activity level will improve Outcome: Progressing   Problem: Cardiovascular: Goal: Ability to achieve and maintain adequate cardiovascular perfusion will improve Outcome: Progressing Goal: Vascular access site(s)  Level 0-1 will be maintained Outcome: Progressing   Problem: Health Behavior/Discharge Planning: Goal: Ability to safely manage health-related needs after discharge will improve Outcome: Progressing

## 2023-04-23 NOTE — Progress Notes (Signed)
Mobility Specialist Progress Note:    04/23/23 1525  Mobility  Activity Stood at bedside (x3)  Level of Assistance Minimal assist, patient does 75% or more (+2)  Assistive Device Front wheel walker  Activity Response Tolerated well  Mobility Referral Yes  Mobility visit 1 Mobility  Mobility Specialist Start Time (ACUTE ONLY) 1500  Mobility Specialist Stop Time (ACUTE ONLY) 1520  Mobility Specialist Time Calculation (min) (ACUTE ONLY) 20 min   Pt received in bed agreeable to mobility. Required no minA w/ bed mobility and MinA +2 w/STS. Was able to perform x3 STS w/o fault. Was unable to ambulate d/t strong pain in LLE and unable to put any weight on it. Situated back in bed w/ call bell and personal belongings in reach. All needs met. Bed alarm on.  Thompson Grayer Mobility Specialist  Please contact vis Secure Chat or  Rehab Office [phone]

## 2023-04-23 NOTE — Evaluation (Signed)
Occupational Therapy Evaluation Patient Details Name: Laura Wilcox MRN: [id number] DOB: [birth year] Today's Date: 04/23/2023   History of Present Illness 89 y.o. female sent in from her memory care unit 1/12 secondary to pain in L foot/ankle. Vascular consulted and recommended transfer to [facility] for lower extremity angiogram. Now s/p LLE angiogram and stent L superficial popliteal artery 1/14.  Underwent L LE excisional debridement and placement of myriad and wound vac on 04/21/23.  PMH: advanced dementia, polycythemia vera, chronic myelofibrosis on oral chemotherapy, L ankle hematoma and laceration/fall 04/2023, s/p I&D L ankle 04/02/2023 at [facility].   Clinical Impression   Pt with slow progression toward established OT goals. Pt following simple one step commands and able to sequence bed mobility and oral care with min A this session. Able to rise from EOB with mod A but transfer training limited by pt report of feeling "woozy". Returned to supine with BP 111/67 with all needs met and RN aware. Pt to continue to benefit from acute OT at inpatient rehab <3 hours/day.       If plan is discharge home, recommend the following: Two people to help with walking and/or transfers;A lot of help with bathing/dressing/bathroom;Direct supervision/assist for medications management;Supervision due to cognitive status;Direct supervision/assist for financial management;Assist for transportation;Help with stairs or ramp for entrance;Assistance with cooking/housework;Assistance with feeding    Functional Status Assessment     Equipment Recommendations  Other (comment) (defer)    Recommendations for Other Services       Precautions / Restrictions Precautions Precautions: Fall Precaution Comments: L LE wound vac Restrictions Weight Bearing Restrictions Per Provider Order: No      Mobility Bed Mobility Overal bed mobility: Needs Assistance Bed Mobility: Supine to Sit, Sit to Supine     Supine to  sit: HOB elevated, Min assist Sit to supine: Mod assist   General bed mobility comments: assist to scoot hips toward EOB and then for BLE back into bed at end of session    Transfers Overall transfer level: Needs assistance Equipment used: Rolling walker (2 wheels) Transfers: Sit to/from Stand, Bed to chair/wheelchair/BSC Sit to Stand: From elevated surface, Mod assist           General transfer comment: for initiation and rise      Balance Overall balance assessment: Needs assistance   Sitting balance-Leahy Scale: Fair     Standing balance support: Bilateral upper extremity supported Standing balance-Leahy Scale: Poor Standing balance comment: difficulty tolerating weight on L foot                           ADL either performed or assessed with clinical judgement   ADL Overall ADL's : Needs assistance/impaired     Grooming: Oral care;Minimal assistance;Sitting Grooming Details (indicate cue type and reason): cues to spit out toothpaste rather than swallow it min A for putting toothpaste on toothbrush     Lower Body Bathing: Moderate assistance;Sitting/lateral leans Lower Body Bathing Details (indicate cue type and reason): to clean lower half of LEs         Toilet Transfer: Moderate assistance Toilet Transfer Details (indicate cue type and reason): STS this session                 Vision         Perception         Praxis         Pertinent Vitals/Pain Pain Assessment Pain Assessment: Faces Faces Pain  Scale: Hurts little more Pain Location: LLE with weightbearing; BLE tender to touch Pain Descriptors / Indicators: Discomfort, Grimacing, Guarding Pain Intervention(s): Limited activity within patient's tolerance, Monitored during session     Extremity/Trunk Assessment Upper Extremity Assessment Upper Extremity Assessment: Generalized weakness   Lower Extremity Assessment Lower Extremity Assessment: Defer to PT evaluation        Communication     Cognition Arousal: Alert Behavior During Therapy: [facility] for tasks assessed/performed (confused throughout) Overall Cognitive Status: History of cognitive impairments - at baseline                                 General Comments: pt awake on arrival and asking where she is and where her family is on OT entry. Pt believes she was in [place] with her family in the car and despite reorientation believes they were all in an accident. Difficult to redirect but decreased memory as well     General Comments  VSS on RA    Exercises     Shoulder Instructions      Home Living                                          Prior Functioning/Environment                          OT Problem List:        OT Treatment/Interventions:      OT Goals(Current goals can be found in the care plan section) Acute Rehab OT Goals Patient Stated Goal: find her family OT Goal Formulation: Patient unable to participate in goal setting Time For Goal Achievement: 04/30/23 Potential to Achieve Goals: Fair ADL Goals Pt Will Perform Grooming: with min assist;standing Pt Will Perform Lower Body Dressing: with mod assist;sit to/from stand Pt Will Transfer to Toilet: with min assist;stand pivot transfer  OT Frequency: Min 1X/week    Co-evaluation              AM-PAC OT "6 Clicks" Daily Activity     Outcome Measure Help from another person eating meals?: A Little Help from another person taking care of personal grooming?: A Little Help from another person toileting, which includes using toliet, bedpan, or urinal?: A Lot Help from another person bathing (including washing, rinsing, drying)?: A Lot Help from another person to put on and taking off regular upper body clothing?: A Little Help from another person to put on and taking off regular lower body clothing?: A Lot 6 Click Score: 15   End of Session Equipment Utilized During Treatment:  Gait belt;Rolling walker (2 wheels) Nurse Communication: Mobility status (RN asked OT not to leave pt in chair)  Activity Tolerance: Patient tolerated treatment well;Patient limited by pain Patient left: in bed;with call bell/phone within reach;with bed alarm set  OT Visit Diagnosis: Unsteadiness on feet (R26.81);History of falling (Z91.81);Muscle weakness (generalized) (M62.81);Pain;Other symptoms and signs involving cognitive function Pain - Right/Left: Left Pain - part of body: Ankle and joints of foot                Time: 0454-0981 OT Time Calculation (min): 25 min Charges:  OT General Charges $OT Visit: 1 Visit OT Treatments $Self Care/Home Management : 8-22 mins $Therapeutic Activity: 8-22 mins  Myrla Halsted,  OTD, OTR/L [facility] Acute Rehabilitation Office: [phone]   Myrla Halsted 04/23/2023, 8:25 AM

## 2023-04-23 NOTE — Progress Notes (Signed)
2 Days Post-Op  Subjective: 89 year old female 2 days postop from excision of left leg full-thickness wound, placement of myriad wound matrix and wound VAC with Dr. Ulice Bold on 04/21/2023.  Of note, she did have exposed tendon  Patient reports she is feeling fine today, reports no pain to her left lower extremity.  Nurse is present in room, wound VAC is functioning normally.  Patient does not have any questions or concerns at this time.  Per chart review, currently working on placement to SNF.  Of note patient has history of advanced dementia.  Objective: Vital signs in last 24 hours: Temp:  [97.4 F (36.3 C)-98.6 F (37 C)] 97.9 F (36.6 C) (01/22 0832) Pulse Rate:  [84] 84 (01/22 0334) Resp:  [15-18] 18 (01/22 0334) BP: (102-121)/(49-71) 118/56 (01/22 0832) SpO2:  [92 %-98 %] 92 % (01/22 0334) Last BM Date : 04/18/23 (PTA)  Intake/Output from previous day: 01/21 0701 - 01/22 0700 In: -  Out: 1000 [Urine:950; Drains:50] Intake/Output this shift: No intake/output data recorded.  General appearance: alert, no distress, and RN at bedside Resp: Unlabored Extremities: Left lower extremity with wound VAC in place, good seal noted.  Kerlix and Ace wrap also in place.  Distal extremities warm to touch.  Sensation intact distally.  100 cc of serosanguineous drainage noted in the last 24 hours per RN documentation on VAC device.  No erythema or cellulitic changes noted.  No tenderness noted with palpation.   Lab Results:     Latest Ref Rng & Units 04/22/2023    3:20 AM 04/21/2023    3:32 AM 04/20/2023    7:46 PM  CBC  WBC 4.0 - 10.5 K/uL 12.7  15.5    Hemoglobin 12.0 - 15.0 g/dL 9.0  8.9  9.1   Hematocrit 36.0 - 46.0 % 29.2  28.4  28.9   Platelets 150 - 400 K/uL 622  648      BMET Recent Labs    04/21/23 0332 04/22/23 0320  NA 135 135  K 4.7 4.9  CL 101 101  CO2 23 27  GLUCOSE 90 78  BUN 23 20  CREATININE 1.15* 1.09*  CALCIUM 8.7* 9.0   PT/INR No results for  input(s): "LABPROT", "INR" in the last 72 hours. ABG No results for input(s): "PHART", "HCO3" in the last 72 hours.  Invalid input(s): "PCO2", "PO2"  Studies/Results: No results found.  Anti-infectives: Anti-infectives (From admission, onward)    Start     Dose/Rate Route Frequency Ordered Stop   04/21/23 0700  ceFAZolin (ANCEF) IVPB 2g/100 mL premix        2 g 200 mL/hr over 30 Minutes Intravenous On call to O.R. 04/21/23 2951 04/21/23 0745   04/21/23 0640  ceFAZolin (ANCEF) 2-4 GM/100ML-% IVPB       Note to Pharmacy: Camillo Flaming: cabinet override      04/21/23 0640 04/21/23 0747   04/15/23 1400  vancomycin (VANCOREADY) IVPB 750 mg/150 mL  Status:  Discontinued        750 mg 150 mL/hr over 60 Minutes Intravenous Every 48 hours 04/13/23 1920 04/14/23 1510   04/14/23 1200  cefTRIAXone (ROCEPHIN) 1 g in sodium chloride 0.9 % 100 mL IVPB        1 g 200 mL/hr over 30 Minutes Intravenous Every 24 hours 04/13/23 1914 04/23/23 1346       Assessment/Plan: s/p Procedure(s): IRRIGATION AND DEBRIDEMENT WOUND APPLICATION OF SKIN SUBSTITUTE APPLICATION OF WOUND VAC  89 year old female with left lower extremity wound  due to large hematoma.  Hematoma was evacuated previously, patient then underwent debridement, application of myriad wound matrix and wound VAC 2 days ago with Dr. Ulice Bold with plastic surgery.  Patient is doing well today, wound VAC is in place with good seal noted.  Patient is stable for discharge from plastic surgery standpoint, will need to be transition to home wound VAC which is in the possession of her daughter/family.  Patient will need wound VAC changes once weekly, recommend follow-up with our office on 04/28/2023 for initial wound VAC change.  Recommend optimizing nutritional status with multivitamins, protein shakes as tolerated.   LOS: 10 days    Leslee Home, PA-C 04/23/2023

## 2023-04-24 DIAGNOSIS — S81802A Unspecified open wound, left lower leg, initial encounter: Secondary | ICD-10-CM | POA: Diagnosis not present

## 2023-04-24 LAB — BASIC METABOLIC PANEL
Anion gap: 8 (ref 5–15)
BUN: 17 mg/dL (ref 8–23)
CO2: 29 mmol/L (ref 22–32)
Calcium: 9.2 mg/dL (ref 8.9–10.3)
Chloride: 96 mmol/L — ABNORMAL LOW (ref 98–111)
Creatinine, Ser: 1.15 mg/dL — ABNORMAL HIGH (ref 0.44–1.00)
GFR, Estimated: 44 mL/min — ABNORMAL LOW (ref >60)
Glucose, Bld: 86 mg/dL (ref 70–99)
Potassium: 4.3 mmol/L (ref 3.5–5.1)
Sodium: 133 mmol/L — ABNORMAL LOW (ref 135–145)

## 2023-04-24 LAB — CBC
HCT: 30.1 % — ABNORMAL LOW (ref 36.0–46.0)
Hemoglobin: 9.1 g/dL — ABNORMAL LOW (ref 12.0–15.0)
MCH: 26.6 pg (ref 26.0–34.0)
MCHC: 30.2 g/dL (ref 30.0–36.0)
MCV: 88 fL (ref 80.0–100.0)
Platelets: 693 10*3/uL — ABNORMAL HIGH (ref 150–400)
RBC: 3.42 MIL/uL — ABNORMAL LOW (ref 3.87–5.11)
RDW: 27 % — ABNORMAL HIGH (ref 11.5–15.5)
WBC: 15.2 10*3/uL — ABNORMAL HIGH (ref 4.0–10.5)
nRBC: 0.6 % — ABNORMAL HIGH (ref 0.0–0.2)

## 2023-04-24 MED ORDER — DOCUSATE SODIUM 100 MG PO CAPS
100.0000 mg | ORAL_CAPSULE | Freq: Two times a day (BID) | ORAL | Status: DC
  Administered 2023-04-24 – 2023-04-30 (×10): 100 mg via ORAL
  Filled 2023-04-24 (×11): qty 1

## 2023-04-24 MED ORDER — POLYETHYLENE GLYCOL 3350 17 G PO PACK: 17.0000 g | PACK | Freq: Once | ORAL | Status: DC

## 2023-04-24 MED ORDER — SODIUM CHLORIDE 0.9 % IV SOLN
1.0000 g | INTRAVENOUS | Status: AC
  Administered 2023-04-24 – 2023-04-27 (×4): 1 g via INTRAVENOUS
  Filled 2023-04-24 (×4): qty 10

## 2023-04-24 NOTE — Progress Notes (Signed)
  Progress Note   Patient: Laura Wilcox XLK:[id number] DOB: 10-Jan-1927 DOA: 04/13/2023     11 DOS: the patient was seen and examined on 04/24/2023   Brief hospital course: Laura Wilcox is a 89 y.o. female with past medical history significant for advanced dementia, polycythemia vera, chronic myelofibrosis on oral chemotherapy, hypothyroidism who presented from her memory care unit to [facility] ED on 1/12 with severe pain to her left foot.  Patient unable to complete dressing changes at SNF due to pain.  Recently discharged from hospital 2 days prior with basic wound care after a large hematoma was evacuated.  Since discharge, staff unable to change bandages due to her symptoms.  Patient has pending outpatient appointment with plastic surgery, Dr. Hester Mates on 04/15/2023.   In the ED, temperature 97.8 F, HR 81, RR 18, BP 163/72, SpO2 93% on room air.  WBC 12.7, hemoglobin 7.5, platelet count 595.  Sodium 137, potassium 4.3, chloride 102, CO2 23, glucose 88, BUN 24, Cram 1.04.  Left foot x-ray with no evidence for acute fracture, no subluxation or dislocation.  TRH consulted for admission.  Assessment and Plan: L lower ext nonhealing wound w/ cellulitis  - Continue wound vac - Fentanyl PRN  - Feeding supplement ensure enlive PO bid - MVI PO daily   - ASA 81 mg PO daily   Advanced dementia  - Complicating care   Myelofibrosis  - Hold Jakafi given acute infection   Hypothyroidism  - Synthroid 25 mcg PO daily   Anxiety/depression  - Celexa 10 mg PO daily   Weakness and debility  - PT have recommended SNF placement       Subjective: Pt seen and examined at the bedside. Wound vac is in place and continues to drain serosanguineous fluid. Awaiting SNF placement.   Physical Exam: Vitals:   04/23/23 2048 04/24/23 0012 04/24/23 0339 04/24/23 0816  BP: 131/72 (!) 129/57 (!) 156/75 (!) 157/89  Pulse: 86 80 90 96  Resp: 19 18 20 19   Temp: 98.1 F (36.7 C) 97.8 F (36.6 C) 97.8 F (36.6  C) 97.7 F (36.5 C)  TempSrc: Oral Oral Oral Oral  SpO2: 100% 100% 93% 91%  Weight:      Height:       Physical Exam HENT:     Head: Normocephalic.     Mouth/Throat:     Mouth: Mucous membranes are moist.  Cardiovascular:     Rate and Rhythm: Normal rate and regular rhythm.  Pulmonary:     Effort: Pulmonary effort is normal.  Abdominal:     Palpations: Abdomen is soft.  Musculoskeletal:     Cervical back: Neck supple.     Comments: L wound vac in place and draining   Neurological:     Mental Status: She is alert. Mental status is at baseline.  Psychiatric:        Mood and Affect: Mood normal.        Disposition: Status is: Inpatient Remains inpatient appropriate because: while awaiting SNF  Planned Discharge Destination: Skilled nursing facility    Time spent: 35 minutes  Author: Baron Hamper , MD 04/24/2023 11:03 AM  For on call review [email or web address].

## 2023-04-24 NOTE — Plan of Care (Signed)

## 2023-04-24 NOTE — TOC Progression Note (Signed)
Transition of Care [facility]) - Progression Note    Patient Details  Name: Laura Wilcox MRN: [id number] Date of Birth: 09-15-1926  Transition of Care [facility]) CM/SW Contact  Eduard Roux, [place] Phone Number: 04/24/2023, 12:31 PM  Clinical Narrative:     [facility] - confirmed they have wound vac and ready to admit  Per MD patient is not stable for d/c today, LVM w/ SNF will not d/c today.  TOC will continue to follow and assist with discharge summary.  Antony Blackbird, MSW, LCSW Clinical Social Worker    Expected Discharge Plan: Skilled Nursing Facility Barriers to Discharge: Continued Medical Work up, SNF Pending bed offer  Expected Discharge Plan and Services In-house Referral: Clinical Social Work     Living arrangements for the past 2 months: Assisted Living Facility                                       Social Determinants of Health (SDOH) Interventions SDOH Screenings   Food Insecurity: No Food Insecurity (04/13/2023)  Housing: Low Risk  (04/13/2023)  Transportation Needs: No Transportation Needs (04/13/2023)  Utilities: Not At Risk (04/13/2023)  Alcohol Screen: Low Risk  (06/18/2021)  Depression (PHQ2-9): High Risk (06/18/2021)  Social Connections: Unknown (04/13/2023)  Tobacco Use: Low Risk  (04/21/2023)    Readmission Risk Interventions    06/26/2021    4:26 PM  Readmission Risk Prevention Plan  Transportation Screening Complete  PCP or Specialist Appt within 5-7 Days Complete  Home Care Screening Complete  Medication Review (RN CM) Complete

## 2023-04-25 DIAGNOSIS — S81802A Unspecified open wound, left lower leg, initial encounter: Secondary | ICD-10-CM | POA: Diagnosis not present

## 2023-04-25 LAB — COMPREHENSIVE METABOLIC PANEL
ALT: 20 U/L (ref 0–44)
AST: 40 U/L (ref 15–41)
Albumin: 2.6 g/dL — ABNORMAL LOW (ref 3.5–5.0)
Alkaline Phosphatase: 82 U/L (ref 38–126)
Anion gap: 13 (ref 5–15)
BUN: 15 mg/dL (ref 8–23)
CO2: 27 mmol/L (ref 22–32)
Calcium: 9.4 mg/dL (ref 8.9–10.3)
Chloride: 98 mmol/L (ref 98–111)
Creatinine, Ser: 1.14 mg/dL — ABNORMAL HIGH (ref 0.44–1.00)
GFR, Estimated: 45 mL/min — ABNORMAL LOW (ref >60)
Glucose, Bld: 89 mg/dL (ref 70–99)
Potassium: 4.3 mmol/L (ref 3.5–5.1)
Sodium: 138 mmol/L (ref 135–145)
Total Bilirubin: 0.5 mg/dL (ref 0.0–1.2)
Total Protein: 5.7 g/dL — ABNORMAL LOW (ref 6.5–8.1)

## 2023-04-25 LAB — C-REACTIVE PROTEIN: CRP: 3.1 mg/dL — ABNORMAL HIGH (ref <1.0)

## 2023-04-25 LAB — CBC
HCT: 28.3 % — ABNORMAL LOW (ref 36.0–46.0)
Hemoglobin: 8.6 g/dL — ABNORMAL LOW (ref 12.0–15.0)
MCH: 26.8 pg (ref 26.0–34.0)
MCHC: 30.4 g/dL (ref 30.0–36.0)
MCV: 88.2 fL (ref 80.0–100.0)
Platelets: 677 10*3/uL — ABNORMAL HIGH (ref 150–400)
RBC: 3.21 MIL/uL — ABNORMAL LOW (ref 3.87–5.11)
RDW: 27.1 % — ABNORMAL HIGH (ref 11.5–15.5)
WBC: 15.9 10*3/uL — ABNORMAL HIGH (ref 4.0–10.5)
nRBC: 0.4 % — ABNORMAL HIGH (ref 0.0–0.2)

## 2023-04-25 LAB — MAGNESIUM: Magnesium: 1.8 mg/dL (ref 1.7–2.4)

## 2023-04-25 NOTE — Progress Notes (Signed)
Physical Therapy Treatment Patient Details Name: Laura Wilcox MRN: [id number] DOB: Feb 23, 1927 Today's Date: 04/25/2023   History of Present Illness 89 y.o. female sent in from her memory care unit 1/12 secondary to pain in L foot/ankle. Vascular consulted and recommended transfer to [facility] for lower extremity angiogram. Now s/p LLE angiogram and stent L superficial popliteal artery 1/14.  Underwent L LE excisional debridement and placement of myriad and wound vac on 04/21/23.  PMH: advanced dementia, polycythemia vera, chronic myelofibrosis on oral chemotherapy, L ankle hematoma and laceration/fall 04/2023, s/p I&D L ankle 04/02/2023 at [facility].    PT Comments  Pt sleeping on arrival. Mild agitation noted when roused, which escalated with mobility attempts. Mod assist bed mobility. Pt did not fully transition to EOB before attempting to return to supine, stating "I told you I'm not going anywhere today." Further attempts made to address mobility but agitation escalation continued. Pt with eyes closed, resting peacefully when therapist not attempting to engage.     If plan is discharge home, recommend the following: Two people to help with walking and/or transfers;Help with stairs or ramp for entrance;Assist for transportation;Assistance with cooking/housework;A lot of help with bathing/dressing/bathroom   Can travel by private vehicle     No  Equipment Recommendations  None recommended by PT    Recommendations for Other Services       Precautions / Restrictions Precautions Precautions: Fall;Other (comment) Precaution Comments: L LE wound vac Restrictions Weight Bearing Restrictions Per Provider Order: No     Mobility  Bed Mobility Overal bed mobility: Needs Assistance Bed Mobility: Supine to Sit, Sit to Supine     Supine to sit: HOB elevated, Mod assist Sit to supine: Mod assist   General bed mobility comments: Unsuccessful attempt to transition to EOB. Pt became agitated,  resisting mobility and returning to supine. Repeatedly stating, "I told you I'm not going anywhere today."    Transfers                        Ambulation/Gait                   Stairs             Wheelchair Mobility     Tilt Bed    Modified Rankin (Stroke Patients Only)       Balance                                            Cognition Arousal: Alert Behavior During Therapy: Agitated Overall Cognitive Status: History of cognitive impairments - at baseline                                 General Comments: Pt agitated today and resistive to mobility attempts. Repeatedly stating "I told you, I am not going anywhere today."        Exercises Other Exercises Other Exercises: attempted gentle AAROM exercises BLE but pt agitation escalating.    General Comments        Pertinent Vitals/Pain Pain Assessment Pain Assessment: Faces Faces Pain Scale: Hurts even more Pain Location: LLE with mobility Pain Descriptors / Indicators: Discomfort, Grimacing, Guarding Pain Intervention(s): Limited activity within patient's tolerance, Monitored during session    Home Living  Prior Function            PT Goals (current goals can now be found in the care plan section) Acute Rehab PT Goals Patient Stated Goal: not stated Progress towards PT goals: Not progressing toward goals - comment (agitation)    Frequency    Min 1X/week      PT Plan      Co-evaluation              AM-PAC PT "6 Clicks" Mobility   Outcome Measure  Help needed turning from your back to your side while in a flat bed without using bedrails?: A Lot Help needed moving from lying on your back to sitting on the side of a flat bed without using bedrails?: A Lot Help needed moving to and from a bed to a chair (including a wheelchair)?: Total Help needed standing up from a chair using your arms (e.g.,  wheelchair or bedside chair)?: Total Help needed to walk in hospital room?: Total Help needed climbing 3-5 steps with a railing? : Total 6 Click Score: 8    End of Session   Activity Tolerance: Treatment limited secondary to agitation Patient left: in bed;with call bell/phone within reach;with bed alarm set Nurse Communication: Mobility status PT Visit Diagnosis: Other abnormalities of gait and mobility (R26.89);Muscle weakness (generalized) (M62.81);Difficulty in walking, not elsewhere classified (R26.2)     Time: 4098-1191 PT Time Calculation (min) (ACUTE ONLY): 14 min  Charges:    $Therapeutic Activity: 8-22 mins PT General Charges $$ ACUTE PT VISIT: 1 Visit                     Ferd Glassing., PT  Office # [phone]    Ilda Foil 04/25/2023, 12:00 PM

## 2023-04-25 NOTE — Care Management Important Message (Signed)
Important Message  Patient Details  Name: Laura Wilcox MRN: [id number] Date of Birth: 11-24-1926   Important Message Given:  Yes - Medicare IM     Renie Ora 04/25/2023, 11:31 AM

## 2023-04-25 NOTE — Progress Notes (Signed)
  Progress Note   Patient: Laura Wilcox ZOX:[id number] DOB: 1926-05-05 DOA: 04/13/2023     12 DOS: the patient was seen and examined on 04/25/2023   Brief hospital course: Laura Wilcox is a 89 y.o. female with past medical history significant for advanced dementia, polycythemia vera, chronic myelofibrosis on oral chemotherapy, hypothyroidism who presented from her memory care unit to [facility] ED on 1/12 with severe pain to her left foot.  Patient unable to complete dressing changes at SNF due to pain.  Recently discharged from hospital 2 days prior with basic wound care after a large hematoma was evacuated.  Since discharge, staff unable to change bandages due to her symptoms.  Patient has pending outpatient appointment with plastic surgery, Dr. Hester Mates on 04/15/2023.   In the ED, temperature 97.8 F, HR 81, RR 18, BP 163/72, SpO2 93% on room air.  WBC 12.7, hemoglobin 7.5, platelet count 595.  Sodium 137, potassium 4.3, chloride 102, CO2 23, glucose 88, BUN 24, Cram 1.04.  Left foot x-ray with no evidence for acute fracture, no subluxation or dislocation.  TRH consulted for admission.  Assessment and Plan: L lower ext nonhealing wound w/ cellulitis  - Continue wound vac - IV ceftriaxone resumed 04/24/2023 as pt's WBC has been rising (12.7 -->15.2 -->15.9) - Fentanyl PRN  - Feeding supplement ensure enlive PO bid - MVI PO daily   - ASA 81 mg PO daily    Advanced dementia  - Complicating care    Myelofibrosis  - Hold Jakafi given acute infection    Hypothyroidism  - Synthroid 25 mcg PO daily    Anxiety/depression  - Celexa 10 mg PO daily    Weakness and debility  - PT have recommended SNF placement        Subjective: Pt seen and examined at the bedside. CM advised they have a bed placement at SNF for the pt. However, for the past 3 days the pt's WBC has been rising. She was continued on IV ceftriaxone to see if this will normalize her WBC prior to discharging her to SNF. Monitor  labs carefully through the weekend.  Physical Exam: Vitals:   04/24/23 2327 04/25/23 0356 04/25/23 0400 04/25/23 0849  BP: (!) 155/66 137/65  119/65  Pulse: 84 78  82  Resp: 18 16  16   Temp: 98.2 F (36.8 C) 97.6 F (36.4 C)  97.7 F (36.5 C)  TempSrc: Oral Oral  Oral  SpO2: 92% 93% 92% 100%  Weight:      Height:       HENT:     Head: Normocephalic.     Mouth/Throat:     Mouth: Mucous membranes are moist.  Cardiovascular:     Rate and Rhythm: Normal rate and regular rhythm.  Pulmonary:     Effort: Pulmonary effort is normal.  Abdominal:     Palpations: Abdomen is soft.  Musculoskeletal:     Cervical back: Neck supple.     Comments: L wound vac in place and draining   Neurological:     Mental Status: She is alert. Mental status is at baseline.  Psychiatric:        Mood and Affect: Mood normal.      Disposition: Status is: Inpatient Remains inpatient appropriate because: IV antibx and wound vac   Planned Discharge Destination: Skilled nursing facility    Time spent: 35 minutes  Author: Baron Hamper , MD 04/25/2023 12:18 PM  For on call review [email or web address].

## 2023-04-26 DIAGNOSIS — L03116 Cellulitis of left lower limb: Secondary | ICD-10-CM | POA: Diagnosis not present

## 2023-04-26 DIAGNOSIS — D45 Polycythemia vera: Secondary | ICD-10-CM | POA: Diagnosis not present

## 2023-04-26 DIAGNOSIS — F0390 Unspecified dementia without behavioral disturbance: Secondary | ICD-10-CM | POA: Diagnosis not present

## 2023-04-26 DIAGNOSIS — Z515 Encounter for palliative care: Secondary | ICD-10-CM | POA: Diagnosis not present

## 2023-04-26 MED ORDER — HALOPERIDOL LACTATE 5 MG/ML IJ SOLN: 5.0000 mg | Freq: Once | INTRAMUSCULAR | Status: DC | PRN

## 2023-04-26 NOTE — Progress Notes (Signed)
MD ordered haldol due to agitation. Rotated staff and informed pt that security frequently checked property no need to worry about anyone messing with her belonging. Pt fell asleep after staff left room. Haldol not needed. When went to administer am meds pt remains confused but not argumentative no signs of agitation noted. Took meds without complaint.

## 2023-04-26 NOTE — Progress Notes (Signed)
  Progress Note   Patient: Laura Wilcox ZOX:[id number] DOB: [birth year] DOA: 04/13/2023     13 DOS: the patient was seen and examined on 04/26/2023   Brief hospital course: Patient is a 89 year old female with past medical history significant for advanced dementia, polycythemia vera, chronic myelofibrosis on oral chemotherapy, hypothyroidism who presented from her memory care unit to [facility] ED on 1/12 with severe pain to her left foot.  Patient was unable to complete dressing changes at SNF due to pain.  Recently discharged from hospital with basic wound care after a large hematoma was evacuated.  Patient is currently being managed for left lower extremity nonhealing wound with cellulitis.   Assessment and Plan: L lower ext nonhealing wound w/ cellulitis  - Continue wound vac - IV ceftriaxone resumed 04/24/2023 as pt's WBC has been rising (12.7 -->15.2 -->15.9) - Fentanyl PRN  - Feeding supplement ensure enlive PO bid - MVI PO daily   - ASA 81 mg PO daily    Advanced dementia  - Complicating care    Myelofibrosis  - Hold Jakafi given acute infection    Hypothyroidism  - Synthroid 25 mcg PO daily    Anxiety/depression  - Celexa 10 mg PO daily    Weakness and debility  - PT have recommended SNF placement        Subjective:  -No history from patient.    Physical Exam: Vitals:   04/26/23 0814 04/26/23 1234 04/26/23 1622 04/26/23 1721  BP: 136/65 134/62 (!) 174/77 139/62  Pulse: 86 82 87 81  Resp:      Temp: 97.8 F (36.6 C) (!) 97.4 F (36.3 C) (!) 97.5 F (36.4 C)   TempSrc: Axillary Axillary Axillary   SpO2: 91% 92% 93% 90%  Weight:      Height:       Current condition: Patient is lethargic  HENT:     Patient is pale.   Cardiovascular:     S1-S2. Pulmonary:     Effort: Pulmonary effort is normal.  Musculoskeletal:     Cervical back: Neck supple.     Comments: L wound vac in place and draining   Neurological:     Patient is  lethargic  Disposition: Status is: Inpatient Remains inpatient appropriate because: IV antibx and wound vac   Planned Discharge Destination: Skilled nursing facility    Time spent: 35 minutes  Author: Barnetta Chapel, MD 04/26/2023 5:43 PM  For on call review [email or web address].

## 2023-04-26 NOTE — Progress Notes (Signed)
TRH night cross cover note:  I was notified by RN that this patient is agitated, attempting to independently get out of bed and leave her room, as well as being verbally aggressive towards staff, with these behaviors refractory to attempts at verbal redirection.  In the setting of associated interference with ongoing medical treatment posing potential harm to themself, including increased fall risk, I have ordered Haldol 5 mg IV x 1 dose prn for agitation.     Newton Pigg, DO Hospitalist

## 2023-04-26 NOTE — Plan of Care (Addendum)
Pt alert to self. Vitals stable WNL until 0400 vitals. Pt agitated and thinking that she needs to go upstairs to get her boys. Attempted to reoriented pt and not effective. BP was over 160. Rechecked Right and Left arm with a significant difference in arms. Completed manual bp which indicated the same difference right arm in 156/70. Left arm 96/50. Contacted on call provider Dr. Arlean Hopping due to pt increasing agitation. Screaming help help. I'm going to sue if my belonging get stolen. Pt pulling herself to end of bed. I've got to get out of here. My daughter is taking me to dinner. Informed pt daughter was aware of where she was. It did not provide pt any comfort. Wound vac remains on the left foot. Which limits mobility and increase risk for fall. Fear of injury or fall if patient continues in current state. Attempted to get tech to assist with de-esculation of the situation with a different face. Was unsuccessful. Informed pt that we have security that monitors all areas. MD ordered Haldol for agitation.  Problem: Safety: Goal: Non-violent Restraint(s) Outcome: Not Applicable  Order d/c not aplpied  Problem: Education: Goal: Knowledge of General Education information will improve Description: Including pain rating scale, medication(s)/side effects and non-pharmacologic comfort measures Outcome: Not Progressing   Problem: Health Behavior/Discharge Planning: Goal: Ability to manage health-related needs will improve Outcome: Not Progressing   Problem: Coping: Goal: Level of anxiety will decrease Outcome: Not Progressing   Problem: Health Behavior/Discharge Planning: Goal: Ability to safely manage health-related needs after discharge will improve Outcome: Not Progressing   Problem: Clinical Measurements: Goal: Ability to maintain clinical measurements within normal limits will improve Outcome: Progressing Goal: Will remain free from infection Outcome: Progressing Goal: Diagnostic test results  will improve Outcome: Progressing Goal: Respiratory complications will improve Outcome: Progressing Goal: Cardiovascular complication will be avoided Outcome: Progressing   Problem: Activity: Goal: Risk for activity intolerance will decrease Outcome: Progressing   Problem: Nutrition: Goal: Adequate nutrition will be maintained Outcome: Progressing   Problem: Elimination: Goal: Will not experience complications related to bowel motility Outcome: Progressing Goal: Will not experience complications related to urinary retention Outcome: Progressing   Problem: Pain Management: Goal: General experience of comfort will improve Outcome: Progressing   Problem: Safety: Goal: Ability to remain free from injury will improve Outcome: Progressing   Problem: Skin Integrity: Goal: Risk for impaired skin integrity will decrease Outcome: Progressing   Problem: Education: Goal: Understanding of CV disease, CV risk reduction, and recovery process will improve Outcome: Progressing   Problem: Activity: Goal: Ability to return to baseline activity level will improve Outcome: Progressing   Problem: Cardiovascular: Goal: Ability to achieve and maintain adequate cardiovascular perfusion will improve Outcome: Progressing

## 2023-04-27 ENCOUNTER — Inpatient Hospital Stay (HOSPITAL_COMMUNITY): Payer: Medicare Other

## 2023-04-27 DIAGNOSIS — R4589 Other symptoms and signs involving emotional state: Secondary | ICD-10-CM

## 2023-04-27 DIAGNOSIS — S91002D Unspecified open wound, left ankle, subsequent encounter: Secondary | ICD-10-CM

## 2023-04-27 LAB — COMPREHENSIVE METABOLIC PANEL
ALT: 21 U/L (ref 0–44)
AST: 28 U/L (ref 15–41)
Albumin: 2.8 g/dL — ABNORMAL LOW (ref 3.5–5.0)
Alkaline Phosphatase: 88 U/L (ref 38–126)
Anion gap: 12 (ref 5–15)
BUN: 12 mg/dL (ref 8–23)
CO2: 26 mmol/L (ref 22–32)
Calcium: 9.5 mg/dL (ref 8.9–10.3)
Chloride: 99 mmol/L (ref 98–111)
Creatinine, Ser: 1.12 mg/dL — ABNORMAL HIGH (ref 0.44–1.00)
GFR, Estimated: 46 mL/min — ABNORMAL LOW (ref >60)
Glucose, Bld: 83 mg/dL (ref 70–99)
Potassium: 4.3 mmol/L (ref 3.5–5.1)
Sodium: 137 mmol/L (ref 135–145)
Total Bilirubin: 0.5 mg/dL (ref 0.0–1.2)
Total Protein: 6.2 g/dL — ABNORMAL LOW (ref 6.5–8.1)

## 2023-04-27 LAB — URINALYSIS, COMPLETE (UACMP) WITH MICROSCOPIC
Bacteria, UA: NONE SEEN
Bilirubin Urine: NEGATIVE
Glucose, UA: NEGATIVE mg/dL
Hgb urine dipstick: NEGATIVE
Ketones, ur: 5 mg/dL — AB
Leukocytes,Ua: NEGATIVE
Nitrite: NEGATIVE
Protein, ur: NEGATIVE mg/dL
Specific Gravity, Urine: 1.024 (ref 1.005–1.030)
pH: 6 (ref 5.0–8.0)

## 2023-04-27 LAB — CBC
HCT: 32.9 % — ABNORMAL LOW (ref 36.0–46.0)
Hemoglobin: 9.9 g/dL — ABNORMAL LOW (ref 12.0–15.0)
MCH: 26.7 pg (ref 26.0–34.0)
MCHC: 30.1 g/dL (ref 30.0–36.0)
MCV: 88.7 fL (ref 80.0–100.0)
Platelets: 713 10*3/uL — ABNORMAL HIGH (ref 150–400)
RBC: 3.71 MIL/uL — ABNORMAL LOW (ref 3.87–5.11)
RDW: 28 % — ABNORMAL HIGH (ref 11.5–15.5)
WBC: 19.8 10*3/uL — ABNORMAL HIGH (ref 4.0–10.5)
nRBC: 0.5 % — ABNORMAL HIGH (ref 0.0–0.2)

## 2023-04-27 LAB — GLUCOSE, CAPILLARY
Glucose-Capillary: 137 mg/dL — ABNORMAL HIGH (ref 70–99)
Glucose-Capillary: 142 mg/dL — ABNORMAL HIGH (ref 70–99)

## 2023-04-27 LAB — MAGNESIUM: Magnesium: 2 mg/dL (ref 1.7–2.4)

## 2023-04-27 LAB — C-REACTIVE PROTEIN: CRP: 1.4 mg/dL — ABNORMAL HIGH (ref <1.0)

## 2023-04-27 MED ORDER — POTASSIUM CHLORIDE 2 MEQ/ML IV SOLN: INTRAVENOUS | Status: DC

## 2023-04-27 MED ORDER — KCL IN DEXTROSE-NACL 20-5-0.45 MEQ/L-%-% IV SOLN: INTRAVENOUS | Status: DC

## 2023-04-27 MED ORDER — KCL IN DEXTROSE-NACL 20-5-0.45 MEQ/L-%-% IV SOLN
INTRAVENOUS | Status: DC
  Filled 2023-04-27 (×2): qty 1000

## 2023-04-27 MED ORDER — ASPIRIN 81 MG PO CHEW
81.0000 mg | CHEWABLE_TABLET | Freq: Every day | ORAL | Status: DC
  Administered 2023-04-27 – 2023-04-30 (×3): 81 mg via ORAL
  Filled 2023-04-27 (×3): qty 1

## 2023-04-27 MED ORDER — ASPIRIN 81 MG PO TBEC: 81.0000 mg | DELAYED_RELEASE_TABLET | Freq: Every day | ORAL | Status: DC

## 2023-04-27 MED ORDER — KCL IN DEXTROSE-NACL 20-5-0.9 MEQ/L-%-% IV SOLN
INTRAVENOUS | Status: DC
  Filled 2023-04-27: qty 1000

## 2023-04-27 NOTE — Progress Notes (Incomplete)
Palliative Medicine Progress Note   Patient Name: Laura Wilcox       Date: 04/27/2023 DOB: 06-23-1926  Age: 89 y.o. MRN#: [id number] Attending Physician: Barnetta Chapel, MD Primary Care Physician: Smiley Houseman, NP Admit Date: 04/13/2023  Reason for Consultation/Follow-up: {Reason for Consult:23484}  HPI/Patient Profile: 89 y.o. female  with past medical history of advanced dementia, polycythemia vera, chronic myelofibrosis on oral chemotherapy, and hypothyroidism who presented from her memory care unit to [facility] ED on 04/13/2023 with severe pain to her left foot.  She was recently hospitalized at [facility] 03/31/2023 with a large ankle hematoma; status post I&D on 04/02/23.  She was discharged 04/11/2023, but has been unable to complete dressing changes at her facility due to pain.   On admission, patient's left foot cool to touch and recent ABI showing severe PAD.  She was admitted with non-healing LLE wound with cellulitis. Vascular surgery was consulted and patient underwent angiogram with stenting of left SFA on 04/15/2023.    She underwent irrigation and debridement of left ankle wound on 1/20.  Today is POD #6.   Palliative Medicine was consulted for goals of care and medical decision making in the setting of "continues with pain and overall prognosis appears poor".   Subjective: Chart reviewed and update received from RN. She reports patient was not agitated last night.  Objective:  Physical Exam            LBM: Last BM Date : 04/24/23     Palliative Assessment/Data: ***     Palliative Medicine Assessment & Plan   Assessment: Principal Problem:   Cellulitis Active Problems:   Polycythemia rubra vera (HCC)   Chronic kidney disease, stage 3b (HCC)   Myelofibrosis (HCC)    Senile dementia with acute confusional state, without behavioral disturbance (HCC)   Hematoma of left ankle   Wound of left leg    Recommendations/Plan: ***  Goals of Care and Additional Recommendations: Limitations on Scope of Treatment: {Recommended Scope and Preferences:21019}  Code Status:   Prognosis:  {Palliative Care Prognosis:23504}  Discharge Planning: {Palliative dispostion:23505}  Care plan was discussed with ***  Thank you for allowing the Palliative Medicine Team to assist in the care of this patient.   ***   Merry Proud, NP   Please contact Palliative Medicine Team phone at  [phone] for questions and concerns.  For individual providers, please see AMION.

## 2023-04-27 NOTE — Progress Notes (Addendum)
Palliative Medicine Progress Note   Patient Name: Laura Wilcox       Date: 04/27/2023 DOB: 1926-11-21  Age: 89 y.o. MRN#: [id number] Attending Physician: Barnetta Chapel, MD Primary Care Physician: Smiley Houseman, NP Admit Date: 04/13/2023   HPI/Patient Profile: 89 y.o. female  with past medical history of advanced dementia, polycythemia vera, chronic myelofibrosis on oral chemotherapy, and hypothyroidism who presented from her memory care unit to [facility] ED on 04/13/2023 with severe pain to her left foot.  She was recently hospitalized at [facility] 03/31/2023 with a large ankle hematoma; status post I&D on 04/02/23.  She was discharged 04/11/2023, but has been unable to complete dressing changes at her facility due to pain.   On admission, patient's left foot cool to touch and recent ABI showing severe PAD.  She was admitted with non-healing LLE wound with cellulitis. Vascular surgery was consulted and patient underwent angiogram with stenting of left SFA on 04/15/2023.   She underwent irrigation and debridement of left ankle wound on 1/20.  Today is POD #5.   Palliative Medicine was consulted for goals of care and medical decision making in the setting of "continues with pain and overall prognosis appears poor".   Subjective: Chart reviewed. Note that patient was agitated overnight, attempting to get out of bed, with behaviors refractory to attempts at verbal redirection.  Patient seen at bedside. She is currently sleeping and appears comfortable, so I did not attempt to wake her.   I later spoke with her daughter Laura Wilcox by phone.  Provided updates on patient's condition as per above.  I did share my concern that given her advanced dementia and other comorbidities, patient has less reserve to  recover from the major acute illness/hospitalization.  Laura Wilcox shares that she has pondered whether her mother is being "put through" more than she can tolerate/handle.  She is being realistic about the situation, but also does not want to rush any major decisions such as transition to comfort-focused care.    Laura Wilcox also shares that patient was previously enrolled with [facility] outpatient palliative services, and had a negative experience due to feeling she was being pushed toward hospice care.  I acknowledged this being a valid concern, and provided emotional support.  Discussed the importance of ongoing conversation with family and the medical team regarding overall plan of  care. Questions and concerns were addressed.    Objective:  Physical Exam Vitals reviewed.  Constitutional:      General: She is sleeping. She is not in acute distress.    Comments: Frail appearing  Pulmonary:     Effort: Pulmonary effort is normal.  Skin:    Comments: Wound VAC to LLE  Psychiatric:        Cognition and Memory: Cognition is impaired.             Palliative Medicine Assessment & Plan   Assessment: Principal Problem:   Cellulitis Active Problems:   Polycythemia rubra vera (HCC)   Chronic kidney disease, stage 3b (HCC)   Myelofibrosis (HCC)   Senile dementia with acute confusional state, without behavioral disturbance (HCC)   Hematoma of left ankle   Wound of left leg    Recommendations/Plan: Continue current scope of care -"treat the treatable" Goal of care is medical stabilization and recovery to the extent possible Plan is for short-term rehab, with hope that patient can ultimately return to her memory care facility PMT will continue to follow  Primary Decision Maker: NEXT OF KIN - daughter and son  Code Status: DNR - Limited   Prognosis:  Unable to determine  Discharge Planning: Skilled Nursing Facility for rehab with Palliative care service follow-up   Thank you for  allowing the Palliative Medicine Team to assist in the care of this patient.   Time: 38 minutes   Merry Proud, NP   Please contact Palliative Medicine Team phone at [phone] for questions and concerns.  For individual providers, please see AMION.

## 2023-04-27 NOTE — Progress Notes (Signed)
  Progress Note   Patient: Laura Wilcox Below ZOX:[id number] DOB: October 14, 1926 DOA: 04/13/2023     14 DOS: the patient was seen and examined on 04/27/2023   Brief hospital course: Patient is a 89 year old female with past medical history significant for advanced dementia, polycythemia vera, chronic myelofibrosis on oral chemotherapy, hypothyroidism who presented from her memory care unit to [facility] ED on 04/13/2023 with severe pain to her left foot and nonhealing left lower leg wound.  Apparently, on previous admission, but recent (04/02/2023), patient had undergone I&D with debridement of skin and soft tissue of the left ankle by Dr. Myra Gianotti.  Patient has undergone angiogram, myriad wound matrix and wound VAC placement to nonhealing left ankle wound.  Worsening leukocytosis is noted.  WBC has risen from 11.5 on 1/9/202025 to 19.8 today, 04/27/2023.  Patient was started on IV Rocephin on 04/24/2023, and this will be completed today.  04/27/2023: Patient completed 4-day course of IV Rocephin today, 04/27/2023.  Worsening leukocytosis persists.  WBC has risen to 19.8 today.  Patient's remains lethargic, but slowly improving.  No wound cultures visualized.  Please have low threshold to reconsult wound care, vascular surgery and, if necessary, infectious disease team.  Meanwhile, we will send samples for urinalysis and if indicated, urine culture, as well as, chest x-ray.  Not sure if procalcitonin will add any significant value to the workup.  Wound VAC remains in place.  Vascular surgery team was hoping to change wound VAC on 04/28/2023.   Assessment and Plan: L lower ext nonhealing wound w/ cellulitis  - Continue wound vac - IV ceftriaxone resumed 04/24/2023 as pt's WBC has been rising (12.7 -->15.2 -->15.9) for 4 days.  IV Rocephin completed today.  WBC continues to rise.  WBC of 19.8 today, 04/27/2023.  See above documentation. - Fentanyl PRN  - Feeding supplement ensure enlive PO bid - MVI PO daily   - ASA 81 mg PO  daily    Advanced dementia  - Complicating care    Myelofibrosis  - Hold Jakafi given acute infection    Hypothyroidism  - Synthroid 25 mcg PO daily    Anxiety/depression  - Celexa 10 mg PO daily    Weakness and debility  - PT have recommended SNF placement        Subjective:  -No history from patient.    Physical Exam: Vitals:   04/27/23 0425 04/27/23 0754 04/27/23 1227 04/27/23 1500  BP: (!) 123/51 (!) 146/79 102/63 115/65  Pulse: 80 84 86 86  Resp: 18   16  Temp: 98.1 F (36.7 C) 97.8 F (36.6 C) 97.6 F (36.4 C) 98.8 F (37.1 C)  TempSrc: Oral Axillary Axillary Axillary  SpO2: 99% 98% 98% 100%  Weight:      Height:       Current condition: Patient is lethargic  HENT:     Patient is pale.   Cardiovascular:     S1-S2. Pulmonary:     Effort: Pulmonary effort is normal.  Musculoskeletal:     Cervical back: Neck supple.     Comments: L wound vac in place and draining   Neurological:     Patient is lethargic  Disposition: Status is: Inpatient Remains inpatient appropriate because: IV antibx and wound vac   Planned Discharge Destination: Skilled nursing facility    Time spent: 35 minutes  Author: Barnetta Chapel, MD 04/27/2023 6:32 PM  For on call review [email or web address].

## 2023-04-28 ENCOUNTER — Inpatient Hospital Stay (HOSPITAL_COMMUNITY): Payer: Medicare Other | Admitting: Anesthesiology

## 2023-04-28 ENCOUNTER — Other Ambulatory Visit: Payer: Self-pay

## 2023-04-28 ENCOUNTER — Encounter (HOSPITAL_COMMUNITY)
Admission: EM | Disposition: A | Discharge status: Skilled Nursing Facility | Payer: Self-pay | Source: Ambulatory Visit | Attending: Internal Medicine

## 2023-04-28 ENCOUNTER — Encounter (HOSPITAL_COMMUNITY): Payer: Self-pay | Admitting: Internal Medicine

## 2023-04-28 DIAGNOSIS — L03116 Cellulitis of left lower limb: Secondary | ICD-10-CM | POA: Diagnosis not present

## 2023-04-28 DIAGNOSIS — S81802A Unspecified open wound, left lower leg, initial encounter: Secondary | ICD-10-CM | POA: Diagnosis not present

## 2023-04-28 HISTORY — PX: APPLICATION OF WOUND VAC: SHX5189

## 2023-04-28 LAB — GLUCOSE, CAPILLARY
Glucose-Capillary: 106 mg/dL — ABNORMAL HIGH (ref 70–99)
Glucose-Capillary: 106 mg/dL — ABNORMAL HIGH (ref 70–99)
Glucose-Capillary: 113 mg/dL — ABNORMAL HIGH (ref 70–99)
Glucose-Capillary: 80 mg/dL (ref 70–99)

## 2023-04-28 LAB — CBC
HCT: 29 % — ABNORMAL LOW (ref 36.0–46.0)
Hemoglobin: 8.7 g/dL — ABNORMAL LOW (ref 12.0–15.0)
MCH: 26.8 pg (ref 26.0–34.0)
MCHC: 30 g/dL (ref 30.0–36.0)
MCV: 89.2 fL (ref 80.0–100.0)
Platelets: 651 10*3/uL — ABNORMAL HIGH (ref 150–400)
RBC: 3.25 MIL/uL — ABNORMAL LOW (ref 3.87–5.11)
RDW: 28.1 % — ABNORMAL HIGH (ref 11.5–15.5)
WBC: 18.9 10*3/uL — ABNORMAL HIGH (ref 4.0–10.5)
nRBC: 0.4 % — ABNORMAL HIGH (ref 0.0–0.2)

## 2023-04-28 LAB — COMPREHENSIVE METABOLIC PANEL
ALT: 18 U/L (ref 0–44)
AST: 19 U/L (ref 15–41)
Albumin: 2.4 g/dL — ABNORMAL LOW (ref 3.5–5.0)
Alkaline Phosphatase: 79 U/L (ref 38–126)
Anion gap: 9 (ref 5–15)
BUN: 17 mg/dL (ref 8–23)
CO2: 23 mmol/L (ref 22–32)
Calcium: 8.3 mg/dL — ABNORMAL LOW (ref 8.9–10.3)
Chloride: 99 mmol/L (ref 98–111)
Creatinine, Ser: 1.29 mg/dL — ABNORMAL HIGH (ref 0.44–1.00)
GFR, Estimated: 38 mL/min — ABNORMAL LOW (ref >60)
Glucose, Bld: 394 mg/dL — ABNORMAL HIGH (ref 70–99)
Potassium: 5.8 mmol/L — ABNORMAL HIGH (ref 3.5–5.1)
Sodium: 131 mmol/L — ABNORMAL LOW (ref 135–145)
Total Bilirubin: 0.3 mg/dL (ref 0.0–1.2)
Total Protein: 5.3 g/dL — ABNORMAL LOW (ref 6.5–8.1)

## 2023-04-28 LAB — MAGNESIUM: Magnesium: 1.8 mg/dL (ref 1.7–2.4)

## 2023-04-28 LAB — POTASSIUM: Potassium: 4.9 mmol/L (ref 3.5–5.1)

## 2023-04-28 LAB — SEDIMENTATION RATE: Sed Rate: 37 mm/h — ABNORMAL HIGH (ref 0–22)

## 2023-04-28 LAB — C-REACTIVE PROTEIN: CRP: 1.4 mg/dL — ABNORMAL HIGH (ref <1.0)

## 2023-04-28 LAB — PHOSPHORUS: Phosphorus: 3.3 mg/dL (ref 2.5–4.6)

## 2023-04-28 SURGERY — APPLICATION, WOUND VAC
Anesthesia: General | Site: Leg Lower | Laterality: Left

## 2023-04-28 MED ORDER — LIDOCAINE-EPINEPHRINE 1 %-1:100000 IJ SOLN
INTRAMUSCULAR | Status: AC
  Filled 2023-04-28: qty 1

## 2023-04-28 MED ORDER — LACTATED RINGERS IV SOLN: INTRAVENOUS | Status: DC

## 2023-04-28 MED ORDER — ORAL CARE MOUTH RINSE: 15.0000 mL | Freq: Once | OROMUCOSAL | Status: AC

## 2023-04-28 MED ORDER — CHLORHEXIDINE GLUCONATE 0.12 % MT SOLN: 15.0000 mL | Freq: Once | OROMUCOSAL | Status: AC

## 2023-04-28 MED ORDER — OXYCODONE HCL 5 MG PO TABS
5.0000 mg | ORAL_TABLET | Freq: Four times a day (QID) | ORAL | Status: DC | PRN
  Administered 2023-04-28: 5 mg via ORAL
  Filled 2023-04-28: qty 1

## 2023-04-28 MED ORDER — EPHEDRINE SULFATE-NACL 50-0.9 MG/10ML-% IV SOSY
PREFILLED_SYRINGE | INTRAVENOUS | Status: DC | PRN
  Administered 2023-04-28: 5 mg via INTRAVENOUS

## 2023-04-28 MED ORDER — CHLORHEXIDINE GLUCONATE CLOTH 2 % EX PADS
6.0000 | MEDICATED_PAD | Freq: Once | CUTANEOUS | Status: DC
  Administered 2023-04-28: 6 via TOPICAL

## 2023-04-28 MED ORDER — SODIUM ZIRCONIUM CYCLOSILICATE 10 G PO PACK
10.0000 g | PACK | Freq: Every day | ORAL | Status: DC
  Filled 2023-04-28: qty 1

## 2023-04-28 MED ORDER — ONDANSETRON HCL 4 MG/2ML IJ SOLN
4.0000 mg | Freq: Once | INTRAMUSCULAR | Status: DC | PRN
Start: 2023-04-28 — End: 2023-04-28

## 2023-04-28 MED ORDER — PROPOFOL 10 MG/ML IV BOLUS
INTRAVENOUS | Status: AC
  Filled 2023-04-28: qty 20

## 2023-04-28 MED ORDER — CHLORHEXIDINE GLUCONATE 0.12 % MT SOLN
OROMUCOSAL | Status: AC
  Administered 2023-04-28: 15 mL via OROMUCOSAL
  Filled 2023-04-28: qty 15

## 2023-04-28 MED ORDER — LIDOCAINE 2% (20 MG/ML) 5 ML SYRINGE
INTRAMUSCULAR | Status: AC
  Filled 2023-04-28: qty 5

## 2023-04-28 MED ORDER — LIDOCAINE 2% (20 MG/ML) 5 ML SYRINGE
INTRAMUSCULAR | Status: DC | PRN
  Administered 2023-04-28: 25 mg via INTRAVENOUS

## 2023-04-28 MED ORDER — FENTANYL CITRATE (PF) 100 MCG/2ML IJ SOLN
INTRAMUSCULAR | Status: AC
  Filled 2023-04-28: qty 2

## 2023-04-28 MED ORDER — SODIUM ZIRCONIUM CYCLOSILICATE 10 G PO PACK
10.0000 g | PACK | Freq: Every day | ORAL | Status: DC
  Administered 2023-04-28 – 2023-04-29 (×2): 10 g via ORAL
  Filled 2023-04-28 (×3): qty 1

## 2023-04-28 MED ORDER — PROPOFOL 500 MG/50ML IV EMUL
INTRAVENOUS | Status: DC | PRN
  Administered 2023-04-28: 100 ug/kg/min via INTRAVENOUS

## 2023-04-28 MED ORDER — 0.9 % SODIUM CHLORIDE (POUR BTL) OPTIME
TOPICAL | Status: DC | PRN
  Administered 2023-04-28: 1000 mL

## 2023-04-28 MED ORDER — PROPOFOL 10 MG/ML IV BOLUS
INTRAVENOUS | Status: DC | PRN
  Administered 2023-04-28: 70 mg via INTRAVENOUS

## 2023-04-28 MED ORDER — FENTANYL CITRATE (PF) 100 MCG/2ML IJ SOLN
25.0000 ug | INTRAMUSCULAR | Status: DC | PRN
  Administered 2023-04-28: 25 ug via INTRAVENOUS

## 2023-04-28 MED ORDER — CEFAZOLIN SODIUM-DEXTROSE 2-4 GM/100ML-% IV SOLN
2.0000 g | INTRAVENOUS | Status: AC
  Administered 2023-04-28: 2 g via INTRAVENOUS
  Filled 2023-04-28: qty 100

## 2023-04-28 SURGICAL SUPPLY — 46 items
BENZOIN TINCTURE PRP APPL 2/3 (GAUZE/BANDAGES/DRESSINGS) ×2 IMPLANT
BNDG ELASTIC 6X10 VLCR STRL LF (GAUZE/BANDAGES/DRESSINGS) IMPLANT
BNDG GAUZE DERMACEA FLUFF 4 (GAUZE/BANDAGES/DRESSINGS) IMPLANT
CANISTER SUCT 3000ML PPV (MISCELLANEOUS) ×2 IMPLANT
CLEANSER WND VASHE 34 (WOUND CARE) IMPLANT
COVER SURGICAL LIGHT HANDLE (MISCELLANEOUS) ×2 IMPLANT
DRAPE IMP U-DRAPE 54X76 (DRAPES) ×2 IMPLANT
DRAPE INCISE IOBAN 66X45 STRL (DRAPES) IMPLANT
DRAPE SURG ORHT 6 SPLT 77X108 (DRAPES) IMPLANT
DRSG ADAPTIC 3X8 NADH LF (GAUZE/BANDAGES/DRESSINGS) IMPLANT
DRSG CUTIMED SORBACT 7X9 (GAUZE/BANDAGES/DRESSINGS) IMPLANT
DRSG HYDROCOLLOID 4X4 (GAUZE/BANDAGES/DRESSINGS) IMPLANT
DRSG VAC GRANUFOAM LG (GAUZE/BANDAGES/DRESSINGS) IMPLANT
DRSG VAC GRANUFOAM MED (GAUZE/BANDAGES/DRESSINGS) IMPLANT
DRSG VAC GRANUFOAM SM (GAUZE/BANDAGES/DRESSINGS) IMPLANT
ELECT CAUTERY BLADE 6.4 (BLADE) IMPLANT
ELECT REM PT RETURN 9FT ADLT (ELECTROSURGICAL) ×2 IMPLANT
ELECTRODE REM PT RTRN 9FT ADLT (ELECTROSURGICAL) ×2 IMPLANT
GAUZE PAD ABD 8X10 STRL (GAUZE/BANDAGES/DRESSINGS) IMPLANT
GAUZE SPONGE 4X4 12PLY STRL (GAUZE/BANDAGES/DRESSINGS) IMPLANT
GLOVE BIO SURGEON STRL SZ 6.5 (GLOVE) ×2 IMPLANT
GLOVE BIOGEL M 6.5 STRL (GLOVE) ×2 IMPLANT
GOWN STRL REUS W/ TWL LRG LVL3 (GOWN DISPOSABLE) ×6 IMPLANT
KIT BASIN OR (CUSTOM PROCEDURE TRAY) ×2 IMPLANT
KIT TURNOVER KIT B (KITS) ×2 IMPLANT
NDL HYPO 25GX1X1/2 BEV (NEEDLE) ×2 IMPLANT
NEEDLE HYPO 25GX1X1/2 BEV (NEEDLE) ×2 IMPLANT
NS IRRIG 1000ML POUR BTL (IV SOLUTION) ×2 IMPLANT
PACK GENERAL/GYN (CUSTOM PROCEDURE TRAY) ×2 IMPLANT
PAD ARMBOARD 7.5X6 YLW CONV (MISCELLANEOUS) ×4 IMPLANT
POWDER MYRIAD MORCLLS FINE 500 (Miscellaneous) IMPLANT
PWDR MYRIAD MORCELLS FINE 500 (Miscellaneous) ×2 IMPLANT
STAPLER VISISTAT 35W (STAPLE) ×2 IMPLANT
SURGILUBE 2OZ TUBE FLIPTOP (MISCELLANEOUS) IMPLANT
SUT MNCRL AB 3-0 PS2 27 (SUTURE) IMPLANT
SUT MNCRL AB 4-0 PS2 18 (SUTURE) IMPLANT
SUT MON AB 2-0 CT1 36 (SUTURE) IMPLANT
SUT MON AB 5-0 PS2 18 (SUTURE) IMPLANT
SUT VIC AB 5-0 PS2 18 (SUTURE) IMPLANT
SUT VICRYL 3 0 (SUTURE) IMPLANT
SWAB COLLECTION DEVICE MRSA (MISCELLANEOUS) IMPLANT
SWAB CULTURE ESWAB REG 1ML (MISCELLANEOUS) IMPLANT
SYR 10ML LL (SYRINGE) IMPLANT
SYR CONTROL 10ML LL (SYRINGE) ×2 IMPLANT
TOWEL GREEN STERILE (TOWEL DISPOSABLE) ×2 IMPLANT
UNDERPAD 30X36 HEAVY ABSORB (UNDERPADS AND DIAPERS) ×2 IMPLANT

## 2023-04-28 NOTE — Progress Notes (Signed)
Lvm with heather at Dr. Gus Height office that patients case will need to be done at main. Patient is not medically stable for outpatient surgery based on our guidelines.

## 2023-04-28 NOTE — Progress Notes (Signed)
Patient is complaining of leg pain, with face pain score 10.Tylenol given. Paged MD requesting additional pain medication. Awaiting response.  Kenard Gower, RN

## 2023-04-28 NOTE — Transfer of Care (Signed)
Immediate Anesthesia Transfer of Care Note  Patient: Laura Wilcox  Procedure(s) Performed: WOUND VAC CHANGE (Left: Leg Lower) APPLICATION OF SKIN SUBSTITUTE (Leg Lower)  Patient Location: PACU  Anesthesia Type:General  Level of Consciousness: awake and patient cooperative  Airway & Oxygen Therapy: Patient Spontanous Breathing and Patient connected to face mask oxygen  Post-op Assessment: Report given to RN, Post -op Vital signs reviewed and stable, and Patient moving all extremities X 4  Post vital signs: Reviewed and stable  Last Vitals:  Vitals Value Taken Time  BP 128/65 04/28/23 1045  Temp    Pulse 64 04/28/23 1049  Resp 22 04/28/23 1050  SpO2 100 % 04/28/23 1049  Vitals shown include unfiled device data.  Last Pain:  Vitals:   04/28/23 0935  TempSrc: Axillary  PainSc: 0-No pain      Patients Stated Pain Goal: 0 (04/28/23 0935)  Complications: No notable events documented.

## 2023-04-28 NOTE — Progress Notes (Signed)
PROGRESS NOTE    Laura Wilcox  ZOX:[id number] DOB: [birth year] DOA: 04/13/2023 PCP: Smiley Houseman, NP   Brief Narrative:  Patient is a 89 year old female with past medical history significant for advanced dementia, polycythemia vera, chronic myelofibrosis on oral chemotherapy, hypothyroidism who presented from her memory care unit to [facility] ED on 04/13/2023 with severe pain to her left foot and nonhealing left lower leg wound.  Apparently, on previous admission, but recent (04/02/2023), patient had undergone I&D with debridement of skin and soft tissue of the left ankle by Dr. Myra Gianotti.  Patient has undergone angiogram, myriad wound matrix and wound VAC placement to nonhealing left ankle wound.  Worsening leukocytosis is noted.  WBC has risen from 11.5 on 1/9/202025 to 19.8 today, 04/27/2023.  Patient was started on IV Rocephin on 04/24/2023, and this will be completed today.   04/27/2023: Patient completed 4-day course of IV Rocephin today, 04/27/2023.  Worsening leukocytosis persists.  WBC has risen to 19.8 today.  Patient's remains lethargic, but slowly improving.  No wound cultures visualized.  Please have low threshold to reconsult wound care, vascular surgery and, if necessary, infectious disease team.  Meanwhile, we will send samples for urinalysis and if indicated, urine culture, as well as, chest x-ray.  Not sure if procalcitonin will add any significant value to the workup.  Wound VAC remains in place.  Vascular surgery team was hoping to change wound VAC on 04/28/2023.  Assessment & Plan:   Principal Problem:   Cellulitis Active Problems:   Senile dementia with acute confusional state, without behavioral disturbance (HCC)   Polycythemia rubra vera (HCC)   Chronic kidney disease, stage 3b (HCC)   Myelofibrosis (HCC)   Hematoma of left ankle   Wound of left leg  L lower ext nonhealing wound w/ cellulitis  - Continue wound vac - IV ceftriaxone resumed 04/24/2023 as pt's WBC has been  rising (12.7 -->15.2 -->15.9) for 4 days.  IV Rocephin completed today.  Leukocytosis stable. - ASA 81 mg PO daily.  Patient underwent wound VAC exchange today by plastic surgery.  Patient has been cleared for discharge.   Advanced dementia  - Complicating care    Myelofibrosis  - Hold Jakafi given acute infection    Hypothyroidism  - Synthroid 25 mcg PO daily    Anxiety/depression  - Celexa 10 mg PO daily    Weakness and debility  - PT have recommended SNF placement   Hyperkalemia: 5.8.  Patient asymptomatic.  Monitor on telemetry.  Started on [place].  Rechecking potassium later today.  Intermittent hyponatremia: Stable.  Asymptomatic.  DVT prophylaxis: heparin injection 5,000 Units Start: 04/13/23 2200   Code Status: Limited: Do not attempt resuscitation (DNR) -DNR-LIMITED -Do Not Intubate/DNI   Family Communication:  None present at bedside.   Status is: Inpatient Remains inpatient appropriate because:-Hyperkalemia, needs monitoring.   Estimated body mass index is 22.73 kg/m as calculated from the following:   Height as of this encounter: 5' (1.524 m).   Weight as of this encounter: 52.8 kg.  Pressure Injury 04/20/23 Heel Left;Posterior Stage 1 -  Intact skin with non-blanchable redness of a localized area usually over a bony prominence. (Active)  04/20/23 1300  Location: Heel  Location Orientation: Left;Posterior  Staging: Stage 1 -  Intact skin with non-blanchable redness of a localized area usually over a bony prominence.  Wound Description (Comments):   Present on Admission:   Dressing Type Foam - Lift dressing to assess site every shift 04/27/23 2049   Nutritional  Assessment: Body mass index is 22.73 kg/m.Marland Kitchen Seen by dietician.  I agree with the assessment and plan as outlined below: Nutrition Status:        . Skin Assessment: I have examined the patient's skin and I agree with the wound assessment as performed by the wound care RN as outlined  below: Pressure Injury 04/20/23 Heel Left;Posterior Stage 1 -  Intact skin with non-blanchable redness of a localized area usually over a bony prominence. (Active)  04/20/23 1300  Location: Heel  Location Orientation: Left;Posterior  Staging: Stage 1 -  Intact skin with non-blanchable redness of a localized area usually over a bony prominence.  Wound Description (Comments):   Present on Admission:   Dressing Type Foam - Lift dressing to assess site every shift 04/27/23 2049    Consultants:  Plastic surgery  Procedures:  As above  Antimicrobials:  Anti-infectives (From admission, onward)    Start     Dose/Rate Route Frequency Ordered Stop   04/28/23 0945  ceFAZolin (ANCEF) IVPB 2g/100 mL premix        2 g 200 mL/hr over 30 Minutes Intravenous On call to O.R. 04/28/23 0859 04/28/23 1048   04/24/23 1315  cefTRIAXone (ROCEPHIN) 1 g in sodium chloride 0.9 % 100 mL IVPB        1 g 200 mL/hr over 30 Minutes Intravenous Every 24 hours 04/24/23 1221 04/27/23 1353   04/21/23 0700  ceFAZolin (ANCEF) IVPB 2g/100 mL premix        2 g 200 mL/hr over 30 Minutes Intravenous On call to O.R. 04/21/23 3086 04/21/23 0745   04/21/23 0640  ceFAZolin (ANCEF) 2-4 GM/100ML-% IVPB       Note to Pharmacy: Isabel Caprice, Destiny: cabinet override      04/21/23 0640 04/21/23 0747   04/15/23 1400  vancomycin (VANCOREADY) IVPB 750 mg/150 mL  Status:  Discontinued        750 mg 150 mL/hr over 60 Minutes Intravenous Every 48 hours 04/13/23 1920 04/14/23 1510   04/14/23 1200  cefTRIAXone (ROCEPHIN) 1 g in sodium chloride 0.9 % 100 mL IVPB        1 g 200 mL/hr over 30 Minutes Intravenous Every 24 hours 04/13/23 1914 04/23/23 1346         Subjective: Patient seen and examined.  She was calm and appeared comfortable, had no complaints.  She was alert and oriented to self only.  Objective: Vitals:   04/28/23 1100 04/28/23 1115 04/28/23 1122 04/28/23 1137  BP: 114/60 130/67 126/65 (!) 130/58  Pulse: 70 71 73 72   Resp: (!) 25 (!) 37 (!) 26 20  Temp:   (!) 97.5 F (36.4 C) (!) 97.5 F (36.4 C)  TempSrc:    Oral  SpO2: 92% 90% 91% 99%  Weight:      Height:        Intake/Output Summary (Last 24 hours) at 04/28/2023 1354 Last data filed at 04/28/2023 1038 Gross per 24 hour  Intake 937.65 ml  Output 255 ml  Net 682.65 ml   Filed Weights   04/13/23 1826  Weight: 52.8 kg    Examination:  General exam: Appears calm and comfortable  Respiratory system: Clear to auscultation. Respiratory effort normal. Cardiovascular system: S1 & S2 heard, RRR. No JVD, murmurs, rubs, gallops or clicks. No pedal edema. Gastrointestinal system: Abdomen is nondistended, soft and nontender. No organomegaly or masses felt. Normal bowel sounds heard. Central nervous system: Alert and oriented x 1. No focal neurological deficits. Extremities:  Symmetric 5 x 5 power. Skin: Dressing in the right lower extremity.  Data Reviewed: I have personally reviewed following labs and imaging studies  CBC: Recent Labs  Lab 04/22/23 0320 04/24/23 0317 04/25/23 0319 04/27/23 0855 04/28/23 0310  WBC 12.7* 15.2* 15.9* 19.8* 18.9*  HGB 9.0* 9.1* 8.6* 9.9* 8.7*  HCT 29.2* 30.1* 28.3* 32.9* 29.0*  MCV 86.6 88.0 88.2 88.7 89.2  PLT 622* 693* 677* 713* 651*   Basic Metabolic Panel: Recent Labs  Lab 04/22/23 0320 04/24/23 0317 04/25/23 0319 04/27/23 0855 04/28/23 0310  NA 135 133* 138 137 131*  K 4.9 4.3 4.3 4.3 5.8*  CL 101 96* 98 99 99  CO2 27 29 27 26 23   GLUCOSE 78 86 89 83 394*  BUN 20 17 15 12 17   CREATININE 1.09* 1.15* 1.14* 1.12* 1.29*  CALCIUM 9.0 9.2 9.4 9.5 8.3*  MG  --   --  1.8 2.0 1.8  PHOS  --   --   --   --  3.3   GFR: Estimated Creatinine Clearance: 19.2 mL/min (A) (by C-G formula based on SCr of 1.29 mg/dL (H)). Liver Function Tests: Recent Labs  Lab 04/25/23 0319 04/27/23 0855 04/28/23 0310  AST 40 28 19  ALT 20 21 18   ALKPHOS 82 88 79  BILITOT 0.5 0.5 0.3  PROT 5.7* 6.2* 5.3*  ALBUMIN  2.6* 2.8* 2.4*   No results for input(s): "LIPASE", "AMYLASE" in the last 168 hours. No results for input(s): "AMMONIA" in the last 168 hours. Coagulation Profile: No results for input(s): "INR", "PROTIME" in the last 168 hours. Cardiac Enzymes: No results for input(s): "CKTOTAL", "CKMB", "CKMBINDEX", "TROPONINI" in the last 168 hours. BNP (last 3 results) No results for input(s): "PROBNP" in the last 8760 hours. HbA1C: No results for input(s): "HGBA1C" in the last 72 hours. CBG: Recent Labs  Lab 04/27/23 1548 04/27/23 2009 04/28/23 0016 04/28/23 0543 04/28/23 0941  GLUCAP 137* 142* 106* 106* 80   Lipid Profile: No results for input(s): "CHOL", "HDL", "LDLCALC", "TRIG", "CHOLHDL", "LDLDIRECT" in the last 72 hours. Thyroid Function Tests: No results for input(s): "TSH", "T4TOTAL", "FREET4", "T3FREE", "THYROIDAB" in the last 72 hours. Anemia Panel: No results for input(s): "VITAMINB12", "FOLATE", "FERRITIN", "TIBC", "IRON", "RETICCTPCT" in the last 72 hours. Sepsis Labs: No results for input(s): "PROCALCITON", "LATICACIDVEN" in the last 168 hours.  No results found for this or any previous visit (from the past 240 hours).   Radiology Studies: DG CHEST PORT 1 VIEW Result Date: 04/27/2023 CLINICAL DATA:  Leukocytosis. EXAM: PORTABLE CHEST 1 VIEW COMPARISON:  09/24/2021. FINDINGS: The heart size and mediastinal contours are stable. There is atherosclerotic calcification of the aorta. A large hiatal hernia is noted. Atelectasis or infiltrate is present at the left lung base. There is blunting of the left costophrenic angle, possible small pleural effusion. There is a fracture of the T9 rib on the right which appears chronic. Degenerative changes are noted in the thoracic spine. IMPRESSION: 1. Patchy atelectasis or infiltrate at the left lung base. 2. Suspected small left pleural effusion. 3. Large hiatal hernia. Electronically Signed   By: Thornell Sartorius M.D.   On: 04/27/2023 20:50     Scheduled Meds:  acetaminophen  500 mg Oral Q6H   aspirin  81 mg Oral Daily   calcitRIOL  0.25 mcg Oral Q M,W,F   cholecalciferol  1,000 Units Oral Daily   citalopram  10 mg Oral Daily   cyanocobalamin  1,000 mcg Oral Daily   docusate  sodium  100 mg Oral BID   feeding supplement  237 mL Oral BID BM   heparin  5,000 Units Subcutaneous Q8H   levothyroxine  25 mcg Oral Q0600   melatonin  6 mg Oral QHS   multivitamin with minerals  1 tablet Oral Daily   polyethylene glycol  17 g Oral Once   sodium chloride flush  3 mL Intravenous Q12H   sodium zirconium cyclosilicate  10 g Oral Daily   Continuous Infusions:   LOS: 15 days   Hughie Closs, MD [facility]  04/28/2023, 1:54 PM   *Please note that this is a verbal dictation therefore any spelling or grammatical errors are due to the "Dragon [employer] One" system interpretation.  Please page via Amion and do not message via secure chat for urgent patient care matters. Secure chat can be used for non urgent patient care matters.  How to contact the [facility] Attending or Consulting provider 7A - 7P or covering provider during after hours 7P -7A, for this patient?  Check the care team in [facility] and look for a) attending/consulting [facility] provider listed and b) the [facility] team listed. Page or secure chat 7A-7P. Log into [email or web address] and use Central's universal password to access. If you do not have the password, please contact the hospital operator. Locate the [facility] provider you are looking for under [facility] and page to a number that you can be directly reached. If you still have difficulty reaching the provider, please page the [facility] (Director on Call) for the Hospitalists listed on amion for assistance.

## 2023-04-28 NOTE — Anesthesia Postprocedure Evaluation (Signed)
Anesthesia Post Note  Patient: Laura Wilcox  Procedure(s) Performed: WOUND VAC CHANGE (Left: Leg Lower) APPLICATION OF SKIN SUBSTITUTE (Leg Lower)     Patient location during evaluation: PACU Anesthesia Type: General Level of consciousness: awake Pain management: pain level controlled Vital Signs Assessment: post-procedure vital signs reviewed and stable Respiratory status: spontaneous breathing, nonlabored ventilation and respiratory function stable Cardiovascular status: blood pressure returned to baseline and stable Postop Assessment: no apparent nausea or vomiting Anesthetic complications: no   No notable events documented.  Last Vitals:  Vitals:   04/28/23 1122 04/28/23 1137  BP: 126/65 (!) 130/58  Pulse: 73 72  Resp: (!) 26 20  Temp: (!) 36.4 C   SpO2: 91% 99%    Last Pain:  Vitals:   04/28/23 1122  TempSrc:   PainSc: 2                  Bridgitte Felicetti A.

## 2023-04-28 NOTE — Progress Notes (Signed)
7 Days Post-Op  Subjective: 89 year old female with a left lower extremity wound that she developed after her hematoma.  She underwent debridement, application of myriad and application of wound VAC with Dr. Ulice Bold on 04/21/2023.  Patient is resting in bed this a.m., sleeping.  Daughter is at bedside.  Patient reports no pain in her left lower extremity  Objective: Vital signs in last 24 hours: Temp:  [97.3 F (36.3 C)-98.8 F (37.1 C)] 97.3 F (36.3 C) (01/27 0343) Pulse Rate:  [76-86] 76 (01/27 0343) Resp:  [16-18] 18 (01/27 0343) BP: (102-143)/(60-65) 143/61 (01/27 0343) SpO2:  [95 %-100 %] 99 % (01/27 0343) Last BM Date : 04/24/23  Intake/Output from previous day: 01/26 0701 - 01/27 0700 In: 927.7 [P.O.:360; I.V.:467.7; IV Piggyback:100] Out: 250 [Urine:250] Intake/Output this shift: No intake/output data recorded.  General appearance: Patient is resting in bed, patient awoken on entering room.  No distress.  Elderly female Resp: Unlabored Extremities: Left lower extremity with wound VAC in place, good seal noted.  Bruising noted over left lower extremity sporadically.  Distal extremities warm to touch.  No erythema or cellulitic changes noted outside of the left lower extremity dressing.  Incision/Wound: Wound vacs in place, did not remove -patient going to the OR this a.m.  Lab Results:     Latest Ref Rng & Units 04/28/2023    3:10 AM 04/27/2023    8:55 AM 04/25/2023    3:19 AM  CBC  WBC 4.0 - 10.5 K/uL 18.9  19.8  15.9   Hemoglobin 12.0 - 15.0 g/dL 8.7  9.9  8.6   Hematocrit 36.0 - 46.0 % 29.0  32.9  28.3   Platelets 150 - 400 K/uL 651  713  677     BMET Recent Labs    04/27/23 0855 04/28/23 0310  NA 137 131*  K 4.3 5.8*  CL 99 99  CO2 26 23  GLUCOSE 83 394*  BUN 12 17  CREATININE 1.12* 1.29*  CALCIUM 9.5 8.3*   PT/INR No results for input(s): "LABPROT", "INR" in the last 72 hours. ABG No results for input(s): "PHART", "HCO3" in the last 72  hours.  Invalid input(s): "PCO2", "PO2"  Studies/Results: DG CHEST PORT 1 VIEW Result Date: 04/27/2023 CLINICAL DATA:  Leukocytosis. EXAM: PORTABLE CHEST 1 VIEW COMPARISON:  09/24/2021. FINDINGS: The heart size and mediastinal contours are stable. There is atherosclerotic calcification of the aorta. A large hiatal hernia is noted. Atelectasis or infiltrate is present at the left lung base. There is blunting of the left costophrenic angle, possible small pleural effusion. There is a fracture of the T9 rib on the right which appears chronic. Degenerative changes are noted in the thoracic spine. IMPRESSION: 1. Patchy atelectasis or infiltrate at the left lung base. 2. Suspected small left pleural effusion. 3. Large hiatal hernia. Electronically Signed   By: Thornell Sartorius M.D.   On: 04/27/2023 20:50    Anti-infectives: Anti-infectives (From admission, onward)    Start     Dose/Rate Route Frequency Ordered Stop   04/28/23 0945  ceFAZolin (ANCEF) IVPB 2g/100 mL premix        2 g 200 mL/hr over 30 Minutes Intravenous On call to O.R. 04/28/23 0859 04/29/23 0559   04/24/23 1315  cefTRIAXone (ROCEPHIN) 1 g in sodium chloride 0.9 % 100 mL IVPB        1 g 200 mL/hr over 30 Minutes Intravenous Every 24 hours 04/24/23 1221 04/27/23 1353   04/21/23 0700  ceFAZolin (ANCEF) IVPB  2g/100 mL premix        2 g 200 mL/hr over 30 Minutes Intravenous On call to O.R. 04/21/23 1610 04/21/23 0745   04/21/23 0640  ceFAZolin (ANCEF) 2-4 GM/100ML-% IVPB       Note to Pharmacy: Camillo Flaming: cabinet override      04/21/23 0640 04/21/23 0747   04/15/23 1400  vancomycin (VANCOREADY) IVPB 750 mg/150 mL  Status:  Discontinued        750 mg 150 mL/hr over 60 Minutes Intravenous Every 48 hours 04/13/23 1920 04/14/23 1510   04/14/23 1200  cefTRIAXone (ROCEPHIN) 1 g in sodium chloride 0.9 % 100 mL IVPB        1 g 200 mL/hr over 30 Minutes Intravenous Every 24 hours 04/13/23 1914 04/23/23 1346        Assessment/Plan: s/p Procedure(s): IRRIGATION AND DEBRIDEMENT WOUND APPLICATION OF SKIN SUBSTITUTE APPLICATION OF WOUND VAC  Patient evaluated this a.m. at bedside, she was asleep and was awoken upon evaluation. Daughter is at bedside.  We discussed surgical plan for wound VAC change today in the OR.  Patient has been n.p.o.  Discussed possibility for OR today with nurse, nurse states patient has not had any oral meds.  Discussed surgical plan with patient's daughter, all of her questions were answered to her content.  We discussed the risks associated with wound VAC change as well as the administration of anesthesia.    LOS: 15 days    Leslee Home, PA-C 04/28/2023

## 2023-04-28 NOTE — Anesthesia Preprocedure Evaluation (Addendum)
Anesthesia Evaluation  Patient identified by MRN, date of birth, ID band Patient awake    Reviewed: Allergy & Precautions, NPO status , Patient's Chart, lab work & pertinent test results  History of Anesthesia Complications (+) history of anesthetic complications  Airway Mallampati: II       Dental  (+) Dental Advisory Given, Poor Dentition   Pulmonary neg pulmonary ROS   breath sounds clear to auscultation       Cardiovascular + Peripheral Vascular Disease  Normal cardiovascular exam Rhythm:Regular Rate:Normal  Stent left femoral/popliteal artery 04/15/23   Neuro/Psych  PSYCHIATRIC DISORDERS  Depression   Dementia HOH    GI/Hepatic negative GI ROS, Neg liver ROS,,,  Endo/Other  Blood Glucose 394 this am recheck in preop 80mg %  Renal/GU Renal InsufficiencyRenal disease  negative genitourinary   Musculoskeletal Open wound left leg with cellulitis   Abdominal   Peds  Hematology  (+) Blood dyscrasia, anemia   Anesthesia Other Findings   Reproductive/Obstetrics                             Anesthesia Physical Anesthesia Plan  ASA: 3  Anesthesia Plan: General   Post-op Pain Management: Minimal or no pain anticipated   Induction: Intravenous  PONV Risk Score and Plan: 4 or greater and Treatment may vary due to age or medical condition and TIVA  Airway Management Planned: LMA  Additional Equipment: None  Intra-op Plan:   Post-operative Plan: Extubation in OR  Informed Consent: I have reviewed the patients History and Physical, chart, labs and discussed the procedure including the risks, benefits and alternatives for the proposed anesthesia with the patient or authorized representative who has indicated his/her understanding and acceptance.   Patient has DNR.  Discussed DNR with power of attorney and Suspend DNR.   Dental advisory given  Plan Discussed with: Anesthesiologist and  CRNA  Anesthesia Plan Comments: (No versed)        Anesthesia Quick Evaluation

## 2023-04-28 NOTE — H&P (View-Only) (Signed)
 7 Days Post-Op  Subjective: 89 year old female with a left lower extremity wound that she developed after her hematoma.  She underwent debridement, application of myriad and application of wound VAC with Dr. Ulice Bold on 04/21/2023.  Patient is resting in bed this a.m., sleeping.  Daughter is at bedside.  Patient reports no pain in her left lower extremity  Objective: Vital signs in last 24 hours: Temp:  [97.3 F (36.3 C)-98.8 F (37.1 C)] 97.3 F (36.3 C) (01/27 0343) Pulse Rate:  [76-86] 76 (01/27 0343) Resp:  [16-18] 18 (01/27 0343) BP: (102-143)/(60-65) 143/61 (01/27 0343) SpO2:  [95 %-100 %] 99 % (01/27 0343) Last BM Date : 04/24/23  Intake/Output from previous day: 01/26 0701 - 01/27 0700 In: 927.7 [P.O.:360; I.V.:467.7; IV Piggyback:100] Out: 250 [Urine:250] Intake/Output this shift: No intake/output data recorded.  General appearance: Patient is resting in bed, patient awoken on entering room.  No distress.  Elderly female Resp: Unlabored Extremities: Left lower extremity with wound VAC in place, good seal noted.  Bruising noted over left lower extremity sporadically.  Distal extremities warm to touch.  No erythema or cellulitic changes noted outside of the left lower extremity dressing.  Incision/Wound: Wound vacs in place, did not remove -patient going to the OR this a.m.  Lab Results:     Latest Ref Rng & Units 04/28/2023    3:10 AM 04/27/2023    8:55 AM 04/25/2023    3:19 AM  CBC  WBC 4.0 - 10.5 K/uL 18.9  19.8  15.9   Hemoglobin 12.0 - 15.0 g/dL 8.7  9.9  8.6   Hematocrit 36.0 - 46.0 % 29.0  32.9  28.3   Platelets 150 - 400 K/uL 651  713  677     BMET Recent Labs    04/27/23 0855 04/28/23 0310  NA 137 131*  K 4.3 5.8*  CL 99 99  CO2 26 23  GLUCOSE 83 394*  BUN 12 17  CREATININE 1.12* 1.29*  CALCIUM 9.5 8.3*   PT/INR No results for input(s): "LABPROT", "INR" in the last 72 hours. ABG No results for input(s): "PHART", "HCO3" in the last 72  hours.  Invalid input(s): "PCO2", "PO2"  Studies/Results: DG CHEST PORT 1 VIEW Result Date: 04/27/2023 CLINICAL DATA:  Leukocytosis. EXAM: PORTABLE CHEST 1 VIEW COMPARISON:  09/24/2021. FINDINGS: The heart size and mediastinal contours are stable. There is atherosclerotic calcification of the aorta. A large hiatal hernia is noted. Atelectasis or infiltrate is present at the left lung base. There is blunting of the left costophrenic angle, possible small pleural effusion. There is a fracture of the T9 rib on the right which appears chronic. Degenerative changes are noted in the thoracic spine. IMPRESSION: 1. Patchy atelectasis or infiltrate at the left lung base. 2. Suspected small left pleural effusion. 3. Large hiatal hernia. Electronically Signed   By: Thornell Sartorius M.D.   On: 04/27/2023 20:50    Anti-infectives: Anti-infectives (From admission, onward)    Start     Dose/Rate Route Frequency Ordered Stop   04/28/23 0945  ceFAZolin (ANCEF) IVPB 2g/100 mL premix        2 g 200 mL/hr over 30 Minutes Intravenous On call to O.R. 04/28/23 0859 04/29/23 0559   04/24/23 1315  cefTRIAXone (ROCEPHIN) 1 g in sodium chloride 0.9 % 100 mL IVPB        1 g 200 mL/hr over 30 Minutes Intravenous Every 24 hours 04/24/23 1221 04/27/23 1353   04/21/23 0700  ceFAZolin (ANCEF) IVPB  2g/100 mL premix        2 g 200 mL/hr over 30 Minutes Intravenous On call to O.R. 04/21/23 1610 04/21/23 0745   04/21/23 0640  ceFAZolin (ANCEF) 2-4 GM/100ML-% IVPB       Note to Pharmacy: Camillo Flaming: cabinet override      04/21/23 0640 04/21/23 0747   04/15/23 1400  vancomycin (VANCOREADY) IVPB 750 mg/150 mL  Status:  Discontinued        750 mg 150 mL/hr over 60 Minutes Intravenous Every 48 hours 04/13/23 1920 04/14/23 1510   04/14/23 1200  cefTRIAXone (ROCEPHIN) 1 g in sodium chloride 0.9 % 100 mL IVPB        1 g 200 mL/hr over 30 Minutes Intravenous Every 24 hours 04/13/23 1914 04/23/23 1346        Assessment/Plan: s/p Procedure(s): IRRIGATION AND DEBRIDEMENT WOUND APPLICATION OF SKIN SUBSTITUTE APPLICATION OF WOUND VAC  Patient evaluated this a.m. at bedside, she was asleep and was awoken upon evaluation. Daughter is at bedside.  We discussed surgical plan for wound VAC change today in the OR.  Patient has been n.p.o.  Discussed possibility for OR today with nurse, nurse states patient has not had any oral meds.  Discussed surgical plan with patient's daughter, all of her questions were answered to her content.  We discussed the risks associated with wound VAC change as well as the administration of anesthesia.    LOS: 15 days    Leslee Home, PA-C 04/28/2023

## 2023-04-28 NOTE — Op Note (Signed)
DATE OF OPERATION: 04/28/2023  LOCATION: [facility] Operating Room  PREOPERATIVE DIAGNOSIS: left leg wound  POSTOPERATIVE DIAGNOSIS: Same  PROCEDURE: left leg wound 10 x 10 cm VAC change with placement of myriad 500 mg.  SURGEON: Laylana Gerwig, DO  EBL: none  CONDITION: Stable  COMPLICATIONS: None  INDICATION: The patient, Laura Wilcox, is a 89 y.o. female born on 1926-09-24, is here for treatment of the left leg wound.   PROCEDURE DETAILS:  The patient was seen prior to surgery and marked.  The IV antibiotics were given. The patient was taken to the operating room and given a general anesthetic. A standard time out was performed and all information was confirmed by those in the room.  The leg was prepped and draped.  The leg wound was irrigated with Vashe.  The myriad was present.  There was some take of the graft.  The wound was 10 x 10 cm. Additional 500 mg of myriad powder was applied.  The sorbact was placed and the VAC.   There was an excellent seal.  The leg was wrapped with kerlex and an ace wrap. The patient was allowed to wake up and taken to recovery room in stable condition at the end of the case. The family was notified at the end of the case.   The advanced practice practitioner (APP) assisted throughout the case.  The APP was essential in retraction and counter traction when needed to make the case progress smoothly.  This retraction and assistance made it possible to see the tissue plans for the procedure.  The assistance was needed for blood control, tissue re-approximation and assisted with closure of the incision site.

## 2023-04-28 NOTE — Anesthesia Procedure Notes (Signed)
Procedure Name: LMA Insertion Date/Time: 04/28/2023 10:17 AM  Performed by: Lianne Cure, RNPre-anesthesia Checklist: Patient identified, Emergency Drugs available, Suction available and Patient being monitored Patient Re-evaluated:Patient Re-evaluated prior to induction Oxygen Delivery Method: Circle System Utilized Preoxygenation: Pre-oxygenation with 100% oxygen Induction Type: IV induction Ventilation: Mask ventilation without difficulty LMA: LMA inserted LMA Size: 4.0 Number of attempts: 1 Airway Equipment and Method: Bite block Placement Confirmation: positive ETCO2 Tube secured with: Tape Dental Injury: Teeth and Oropharynx as per pre-operative assessment

## 2023-04-29 ENCOUNTER — Encounter (HOSPITAL_COMMUNITY): Payer: Self-pay | Admitting: Plastic Surgery

## 2023-04-29 DIAGNOSIS — S81802D Unspecified open wound, left lower leg, subsequent encounter: Secondary | ICD-10-CM

## 2023-04-29 DIAGNOSIS — L03116 Cellulitis of left lower limb: Secondary | ICD-10-CM | POA: Diagnosis not present

## 2023-04-29 LAB — CBC WITH DIFFERENTIAL/PLATELET
Abs Immature Granulocytes: 2.02 10*3/uL — ABNORMAL HIGH (ref 0.00–0.07)
Basophils Absolute: 0.3 10*3/uL — ABNORMAL HIGH (ref 0.0–0.1)
Basophils Relative: 2 %
Eosinophils Absolute: 1.2 10*3/uL — ABNORMAL HIGH (ref 0.0–0.5)
Eosinophils Relative: 6 %
HCT: 34.4 % — ABNORMAL LOW (ref 36.0–46.0)
Hemoglobin: 10 g/dL — ABNORMAL LOW (ref 12.0–15.0)
Immature Granulocytes: 11 %
Lymphocytes Relative: 8 %
Lymphs Abs: 1.5 10*3/uL (ref 0.7–4.0)
MCH: 26.2 pg (ref 26.0–34.0)
MCHC: 29.1 g/dL — ABNORMAL LOW (ref 30.0–36.0)
MCV: 90.3 fL (ref 80.0–100.0)
Monocytes Absolute: 1.4 10*3/uL — ABNORMAL HIGH (ref 0.1–1.0)
Monocytes Relative: 7 %
Neutro Abs: 12.3 10*3/uL — ABNORMAL HIGH (ref 1.7–7.7)
Neutrophils Relative %: 66 %
Platelets: 676 10*3/uL — ABNORMAL HIGH (ref 150–400)
RBC: 3.81 MIL/uL — ABNORMAL LOW (ref 3.87–5.11)
RDW: 28.3 % — ABNORMAL HIGH (ref 11.5–15.5)
WBC: 18.7 10*3/uL — ABNORMAL HIGH (ref 4.0–10.5)
nRBC: 0.6 % — ABNORMAL HIGH (ref 0.0–0.2)

## 2023-04-29 LAB — BASIC METABOLIC PANEL
Anion gap: 12 (ref 5–15)
BUN: 23 mg/dL (ref 8–23)
CO2: 25 mmol/L (ref 22–32)
Calcium: 9.4 mg/dL (ref 8.9–10.3)
Chloride: 96 mmol/L — ABNORMAL LOW (ref 98–111)
Creatinine, Ser: 1.54 mg/dL — ABNORMAL HIGH (ref 0.44–1.00)
GFR, Estimated: 31 mL/min — ABNORMAL LOW (ref >60)
Glucose, Bld: 104 mg/dL — ABNORMAL HIGH (ref 70–99)
Potassium: 4.7 mmol/L (ref 3.5–5.1)
Sodium: 133 mmol/L — ABNORMAL LOW (ref 135–145)

## 2023-04-29 LAB — GLUCOSE, CAPILLARY
Glucose-Capillary: 96 mg/dL (ref 70–99)
Glucose-Capillary: 106 mg/dL — ABNORMAL HIGH (ref 70–99)

## 2023-04-29 MED ORDER — OXYCODONE HCL 5 MG PO TABS: 5.0000 mg | ORAL_TABLET | Freq: Four times a day (QID) | ORAL | 0 refills | Status: DC | PRN

## 2023-04-29 NOTE — TOC Progression Note (Signed)
Transition of Care [facility]) - Progression Note    Patient Details  Name: Laura Wilcox MRN: [id number] Date of Birth: [birth year]  Transition of Care [facility]) CM/SW Contact  Eduard Roux, [place] Phone Number: 04/29/2023, 11:30 AM  Clinical Narrative:     CSW received message from MD, patient is stable for d/c -  [facility] will need to re-order wound vac- can admit patient tomorrow   TOC will continue to follow and assist with discharge planning.   Antony Blackbird, MSW, LCSW Clinical Social Worker    Expected Discharge Plan: Skilled Nursing Facility Barriers to Discharge: Continued Medical Work up  Expected Discharge Plan and Services In-house Referral: Clinical Social Work     Living arrangements for the past 2 months: Assisted Living Facility                                       Social Determinants of Health (SDOH) Interventions SDOH Screenings   Food Insecurity: No Food Insecurity (04/13/2023)  Housing: Low Risk  (04/13/2023)  Transportation Needs: No Transportation Needs (04/13/2023)  Utilities: Not At Risk (04/13/2023)  Alcohol Screen: Low Risk  (06/18/2021)  Depression (PHQ2-9): High Risk (06/18/2021)  Social Connections: Unknown (04/13/2023)  Tobacco Use: Low Risk  (04/28/2023)    Readmission Risk Interventions    06/26/2021    4:26 PM  Readmission Risk Prevention Plan  Transportation Screening Complete  PCP or Specialist Appt within 5-7 Days Complete  Home Care Screening Complete  Medication Review (RN CM) Complete

## 2023-04-29 NOTE — Progress Notes (Signed)
1 Day Post-Op  Subjective: 89 year old female with left lower extremity wound due to hematoma of the left leg.  Patient underwent wound VAC change to left leg wound with placement of 500 mg of myriad wound matrix yesterday with Dr. Ulice Bold.  Patient reports mild pain in the left lower extremity.  She otherwise feels as if she is doing well.  She has no specific complaints.  Of note, received secure chat from RN and WOC RN today noting a right lower extremity hematoma was noted on exam.  We discussed recommendations for Vaseline and Xeroform.  Objective: Vital signs in last 24 hours: Temp:  [97.3 F (36.3 C)-98.8 F (37.1 C)] 98.8 F (37.1 C) (01/28 1224) Pulse Rate:  [77-103] 103 (01/28 1224) Resp:  [18-24] 18 (01/28 0334) BP: (113-135)/(64-73) 114/66 (01/28 1224) SpO2:  [92 %-100 %] 95 % (01/28 1224) Last BM Date : 04/29/23  Intake/Output from previous day: 01/27 0701 - 01/28 0700 In: 850 [P.O.:600; I.V.:150; IV Piggyback:100] Out: 405 [Urine:400; Blood:5] Intake/Output this shift: No intake/output data recorded.  General appearance: alert, no distress, and elderly female Resp: Unlabored Extremities: Left lower extremity with Ace wrap dressing and wound VAC in place, good seal noted.  No drainage noted in canister.  Some ecchymosis noted of left lower extremity.  Distal extremities warm to touch, left lower extremity minimally tender to palpation. Incision/Wound: Right lower extremity with 4 x 6 cm area of ecchymosis noted, no overt hematoma noted on exam, possibly a small hematoma, however overlying skin is viable.  There is no surrounding erythema or cellulitic changes.  Area is nontender.  Distal extremity is warm to touch  Lab Results:     Latest Ref Rng & Units 04/29/2023   11:04 AM 04/28/2023    3:10 AM 04/27/2023    8:55 AM  CBC  WBC 4.0 - 10.5 K/uL 18.7  18.9  19.8   Hemoglobin 12.0 - 15.0 g/dL 16.1  8.7  9.9   Hematocrit 36.0 - 46.0 % 34.4  29.0  32.9   Platelets  150 - 400 K/uL 676  651  713     BMET Recent Labs    04/28/23 0310 04/28/23 1508 04/29/23 1158  NA 131*  --  133*  K 5.8* 4.9 4.7  CL 99  --  96*  CO2 23  --  25  GLUCOSE 394*  --  104*  BUN 17  --  23  CREATININE 1.29*  --  1.54*  CALCIUM 8.3*  --  9.4   PT/INR No results for input(s): "LABPROT", "INR" in the last 72 hours. ABG No results for input(s): "PHART", "HCO3" in the last 72 hours.  Invalid input(s): "PCO2", "PO2"  Studies/Results: DG CHEST PORT 1 VIEW Result Date: 04/27/2023 CLINICAL DATA:  Leukocytosis. EXAM: PORTABLE CHEST 1 VIEW COMPARISON:  09/24/2021. FINDINGS: The heart size and mediastinal contours are stable. There is atherosclerotic calcification of the aorta. A large hiatal hernia is noted. Atelectasis or infiltrate is present at the left lung base. There is blunting of the left costophrenic angle, possible small pleural effusion. There is a fracture of the T9 rib on the right which appears chronic. Degenerative changes are noted in the thoracic spine. IMPRESSION: 1. Patchy atelectasis or infiltrate at the left lung base. 2. Suspected small left pleural effusion. 3. Large hiatal hernia. Electronically Signed   By: Thornell Sartorius M.D.   On: 04/27/2023 20:50    Anti-infectives: Anti-infectives (From admission, onward)    Start  Dose/Rate Route Frequency Ordered Stop   04/28/23 0945  ceFAZolin (ANCEF) IVPB 2g/100 mL premix        2 g 200 mL/hr over 30 Minutes Intravenous On call to O.R. 04/28/23 0859 04/28/23 1048   04/24/23 1315  cefTRIAXone (ROCEPHIN) 1 g in sodium chloride 0.9 % 100 mL IVPB        1 g 200 mL/hr over 30 Minutes Intravenous Every 24 hours 04/24/23 1221 04/27/23 1353   04/21/23 0700  ceFAZolin (ANCEF) IVPB 2g/100 mL premix        2 g 200 mL/hr over 30 Minutes Intravenous On call to O.R. 04/21/23 1610 04/21/23 0745   04/21/23 0640  ceFAZolin (ANCEF) 2-4 GM/100ML-% IVPB       Note to Pharmacy: Isabel Caprice, Dest[place]: cabinet override       04/21/23 0640 04/21/23 0747   04/15/23 1400  vancomycin (VANCOREADY) IVPB 750 mg/150 mL  Status:  Discontinued        750 mg 150 mL/hr over 60 Minutes Intravenous Every 48 hours 04/13/23 1920 04/14/23 1510   04/14/23 1200  cefTRIAXone (ROCEPHIN) 1 g in sodium chloride 0.9 % 100 mL IVPB        1 g 200 mL/hr over 30 Minutes Intravenous Every 24 hours 04/13/23 1914 04/23/23 1346       Assessment/Plan: s/p Procedure(s): WOUND VAC CHANGE APPLICATION OF SKIN SUBSTITUTE  Patient is a 89 year old female with a left lower extremity wound secondary to hematoma.  She has underwent initial debridement and wound VAC placement 1 week ago with subsequent wound VAC change and additional myriad wound placement yesterday 04/28/2023.  She is doing well today without any concerns.  Patient's daughter has been notified of updates related to left lower extremity VAC change and myself and Dr. Ulice Bold also spoke with her daughter yesterday.  In regards to the right lower extremity, recommend Vaseline and Xeroform dressing, secure with Kerlix or Mepilex border dressing pending patient comfort.  We will continue to follow, if patient is discharged, can follow-up in our office within 1 week of last surgery for wound VAC change.  Will likely plan for additional outpatient wound VAC change on 05/05/2023, this can be completed as an outpatient if patient is discharged prior to this.     LOS: 16 days    Leslee Home, PA-C 04/29/2023

## 2023-04-29 NOTE — Progress Notes (Signed)
Mobility Specialist Progress Note:    04/29/23 1019  Mobility  Activity Transferred from bed to chair  Level of Assistance Moderate assist, patient does 50-74%  Assistive Device Front wheel walker  Distance Ambulated (ft) 2 ft  Activity Response Tolerated well  Mobility Referral Yes  Mobility visit 1 Mobility  Mobility Specialist Start Time (ACUTE ONLY) 1000  Mobility Specialist Stop Time (ACUTE ONLY) 1015  Mobility Specialist Time Calculation (min) (ACUTE ONLY) 15 min   Pt received in bed, agreeable to mobility session. Limited by pain in LLE. Tolerated well, required ModA via RW to stand. Reports feeling faint as soon as pt was seated, BP 149/87 (100). Required MinA to reposition in chair, daughter in room to assist. Pt reported feeling exhausted and nauseous. Nausea resolved. Left pt in chair with call bell in reach and chair alarm on. Post Mobility BP 117/74 (84). All needs met.   Feliciana Rossetti Mobility Specialist Please contact via Special educational needs teacher or  Rehab office at [phone]

## 2023-04-29 NOTE — Progress Notes (Signed)
PROGRESS NOTE    Laura Wilcox  ZOX:[id number] DOB: [birth year] DOA: 04/13/2023 PCP: Smiley Houseman, NP   Brief Narrative:  Patient is a 89 year old female with past medical history significant for advanced dementia, polycythemia vera, chronic myelofibrosis on oral chemotherapy, hypothyroidism who presented from her memory care unit to [facility] ED on 04/13/2023 with severe pain to her left foot and nonhealing left lower leg wound.  Apparently, on previous admission, but recent (04/02/2023), patient had undergone I&D with debridement of skin and soft tissue of the left ankle by Dr. Myra Gianotti.  Patient has undergone angiogram, myriad wound matrix and wound VAC placement to nonhealing left ankle wound.  Worsening leukocytosis is noted.  WBC has risen from 11.5 on 1/9/202025 to 19.8 today, 04/27/2023.  Patient was started on IV Rocephin on 04/24/2023, and this will be completed today.   04/27/2023: Patient completed 4-day course of IV Rocephin today, 04/27/2023.  Worsening leukocytosis persists.  WBC has risen to 19.8 today.  Patient's remains lethargic, but slowly improving.  No wound cultures visualized.  Please have low threshold to reconsult wound care, vascular surgery and, if necessary, infectious disease team.  Meanwhile, we will send samples for urinalysis and if indicated, urine culture, as well as, chest x-ray.  Not sure if procalcitonin will add any significant value to the workup.  Wound VAC remains in place.  Vascular surgery team was hoping to change wound VAC on 04/28/2023.  Assessment & Plan:   Principal Problem:   Cellulitis Active Problems:   Senile dementia with acute confusional state, without behavioral disturbance (HCC)   Polycythemia rubra vera (HCC)   Chronic kidney disease, stage 3b (HCC)   Myelofibrosis (HCC)   Hematoma of left ankle   Wound of left leg  L lower ext nonhealing wound w/ cellulitis  - Continue wound vac - IV ceftriaxone resumed 04/24/2023 as pt's WBC has been  rising (12.7 -->15.2 -->15.9) for 4 days.  IV Rocephin completed today.  Leukocytosis stable. - ASA 81 mg PO daily.  Patient underwent wound VAC exchange today by plastic surgery.  Patient has been cleared for discharge.   Advanced dementia  - Complicating care    Myelofibrosis  - Hold Jakafi given acute infection    Hypothyroidism  - Synthroid 25 mcg PO daily    Anxiety/depression  - Celexa 10 mg PO daily    Weakness and debility  - PT have recommended SNF placement   Hyperkalemia: 5.8.  Patient asymptomatic.  Monitor on telemetry.  Started on [place].  Rechecking potassium later today.  Intermittent hyponatremia: Stable.  Asymptomatic.  DVT prophylaxis: heparin injection 5,000 Units Start: 04/13/23 2200   Code Status: Limited: Do not attempt resuscitation (DNR) -DNR-LIMITED -Do Not Intubate/DNI   Family Communication: Daughter at bedside  Status is: Inpatient Remains inpatient appropriate because: Patient medically stable.  Per TOC, her facility has to order wound VAC and that will arrive tomorrow so she cannot be discharged today.   Estimated body mass index is 22.73 kg/m as calculated from the following:   Height as of this encounter: 5' (1.524 m).   Weight as of this encounter: 52.8 kg.  Pressure Injury 04/20/23 Heel Left;Posterior Stage 1 -  Intact skin with non-blanchable redness of a localized area usually over a bony prominence. (Active)  04/20/23 1300  Location: Heel  Location Orientation: Left;Posterior  Staging: Stage 1 -  Intact skin with non-blanchable redness of a localized area usually over a bony prominence.  Wound Description (Comments):   Present on  Admission:   Dressing Type Foam - Lift dressing to assess site every shift 04/29/23 0925   Nutritional Assessment: Body mass index is 22.73 kg/m.Marland Kitchen Seen by dietician.  I agree with the assessment and plan as outlined below: Nutrition Status:        . Skin Assessment: I have examined the patient's  skin and I agree with the wound assessment as performed by the wound care RN as outlined below: Pressure Injury 04/20/23 Heel Left;Posterior Stage 1 -  Intact skin with non-blanchable redness of a localized area usually over a bony prominence. (Active)  04/20/23 1300  Location: Heel  Location Orientation: Left;Posterior  Staging: Stage 1 -  Intact skin with non-blanchable redness of a localized area usually over a bony prominence.  Wound Description (Comments):   Present on Admission:   Dressing Type Foam - Lift dressing to assess site every shift 04/29/23 0925    Consultants:  Plastic surgery  Procedures:  As above  Antimicrobials:  Anti-infectives (From admission, onward)    Start     Dose/Rate Route Frequency Ordered Stop   04/28/23 0945  ceFAZolin (ANCEF) IVPB 2g/100 mL premix        2 g 200 mL/hr over 30 Minutes Intravenous On call to O.R. 04/28/23 0859 04/28/23 1048   04/24/23 1315  cefTRIAXone (ROCEPHIN) 1 g in sodium chloride 0.9 % 100 mL IVPB        1 g 200 mL/hr over 30 Minutes Intravenous Every 24 hours 04/24/23 1221 04/27/23 1353   04/21/23 0700  ceFAZolin (ANCEF) IVPB 2g/100 mL premix        2 g 200 mL/hr over 30 Minutes Intravenous On call to O.R. 04/21/23 1610 04/21/23 0745   04/21/23 0640  ceFAZolin (ANCEF) 2-4 GM/100ML-% IVPB       Note to Pharmacy: Isabel Caprice, Destiny: cabinet override      04/21/23 0640 04/21/23 0747   04/15/23 1400  vancomycin (VANCOREADY) IVPB 750 mg/150 mL  Status:  Discontinued        750 mg 150 mL/hr over 60 Minutes Intravenous Every 48 hours 04/13/23 1920 04/14/23 1510   04/14/23 1200  cefTRIAXone (ROCEPHIN) 1 g in sodium chloride 0.9 % 100 mL IVPB        1 g 200 mL/hr over 30 Minutes Intravenous Every 24 hours 04/13/23 1914 04/23/23 1346         Subjective: Patient seen and examined, she is sitting at the chair.  Fully alert and oriented and full of sense of humor.  Daughter at the bedside happy with her recovery.  She is however  concerned about leukocytosis and requested me to check her CBC.  Discussed with the daughter that we will do that however patient being afebrile and no other signs of infection, CBC will not add much to the care.  Objective: Vitals:   04/28/23 1929 04/28/23 2323 04/29/23 0334 04/29/23 0841  BP:  113/69 123/73 124/64  Pulse:   89 99  Resp:  18 18   Temp: (!) 97.3 F (36.3 C) 97.8 F (36.6 C) 97.8 F (36.6 C) 98.2 F (36.8 C)  TempSrc: Oral Oral Oral Oral  SpO2:   99% 92%  Weight:      Height:        Intake/Output Summary (Last 24 hours) at 04/29/2023 1131 Last data filed at 04/29/2023 0925 Gross per 24 hour  Intake 600 ml  Output 400 ml  Net 200 ml   Filed Weights   04/13/23 1826  Weight:  52.8 kg    Examination:  General exam: Appears calm and comfortable  Respiratory system: Clear to auscultation. Respiratory effort normal. Cardiovascular system: S1 & S2 heard, RRR. No JVD, murmurs, rubs, gallops or clicks. No pedal edema. Gastrointestinal system: Abdomen is nondistended, soft and nontender. No organomegaly or masses felt. Normal bowel sounds heard. Central nervous system: Alert and oriented x 1. No focal neurological deficits. Extremities: Symmetric 5 x 5 power. Skin: Dressing in the right lower extremity.  Data Reviewed: I have personally reviewed following labs and imaging studies  CBC: Recent Labs  Lab 04/24/23 0317 04/25/23 0319 04/27/23 0855 04/28/23 0310  WBC 15.2* 15.9* 19.8* 18.9*  HGB 9.1* 8.6* 9.9* 8.7*  HCT 30.1* 28.3* 32.9* 29.0*  MCV 88.0 88.2 88.7 89.2  PLT 693* 677* 713* 651*   Basic Metabolic Panel: Recent Labs  Lab 04/24/23 0317 04/25/23 0319 04/27/23 0855 04/28/23 0310 04/28/23 1508  NA 133* 138 137 131*  --   K 4.3 4.3 4.3 5.8* 4.9  CL 96* 98 99 99  --   CO2 29 27 26 23   --   GLUCOSE 86 89 83 394*  --   BUN 17 15 12 17   --   CREATININE 1.15* 1.14* 1.12* 1.29*  --   CALCIUM 9.2 9.4 9.5 8.3*  --   MG  --  1.8 2.0 1.8  --   PHOS   --   --   --  3.3  --    GFR: Estimated Creatinine Clearance: 19.2 mL/min (A) (by C-G formula based on SCr of 1.29 mg/dL (H)). Liver Function Tests: Recent Labs  Lab 04/25/23 0319 04/27/23 0855 04/28/23 0310  AST 40 28 19  ALT 20 21 18   ALKPHOS 82 88 79  BILITOT 0.5 0.5 0.3  PROT 5.7* 6.2* 5.3*  ALBUMIN 2.6* 2.8* 2.4*   No results for input(s): "LIPASE", "AMYLASE" in the last 168 hours. No results for input(s): "AMMONIA" in the last 168 hours. Coagulation Profile: No results for input(s): "INR", "PROTIME" in the last 168 hours. Cardiac Enzymes: No results for input(s): "CKTOTAL", "CKMB", "CKMBINDEX", "TROPONINI" in the last 168 hours. BNP (last 3 results) No results for input(s): "PROBNP" in the last 8760 hours. HbA1C: No results for input(s): "HGBA1C" in the last 72 hours. CBG: Recent Labs  Lab 04/28/23 0016 04/28/23 0543 04/28/23 0941 04/28/23 2344 04/29/23 0543  GLUCAP 106* 106* 80 113* 96   Lipid Profile: No results for input(s): "CHOL", "HDL", "LDLCALC", "TRIG", "CHOLHDL", "LDLDIRECT" in the last 72 hours. Thyroid Function Tests: No results for input(s): "TSH", "T4TOTAL", "FREET4", "T3FREE", "THYROIDAB" in the last 72 hours. Anemia Panel: No results for input(s): "VITAMINB12", "FOLATE", "FERRITIN", "TIBC", "IRON", "RETICCTPCT" in the last 72 hours. Sepsis Labs: No results for input(s): "PROCALCITON", "LATICACIDVEN" in the last 168 hours.  No results found for this or any previous visit (from the past 240 hours).   Radiology Studies: DG CHEST PORT 1 VIEW Result Date: 04/27/2023 CLINICAL DATA:  Leukocytosis. EXAM: PORTABLE CHEST 1 VIEW COMPARISON:  09/24/2021. FINDINGS: The heart size and mediastinal contours are stable. There is atherosclerotic calcification of the aorta. A large hiatal hernia is noted. Atelectasis or infiltrate is present at the left lung base. There is blunting of the left costophrenic angle, possible small pleural effusion. There is a fracture  of the T9 rib on the right which appears chronic. Degenerative changes are noted in the thoracic spine. IMPRESSION: 1. Patchy atelectasis or infiltrate at the left lung base. 2. Suspected small left  pleural effusion. 3. Large hiatal hernia. Electronically Signed   By: Thornell Sartorius M.D.   On: 04/27/2023 20:50    Scheduled Meds:  acetaminophen  500 mg Oral Q6H   aspirin  81 mg Oral Daily   calcitRIOL  0.25 mcg Oral Q M,W,F   cholecalciferol  1,000 Units Oral Daily   citalopram  10 mg Oral Daily   cyanocobalamin  1,000 mcg Oral Daily   docusate sodium  100 mg Oral BID   feeding supplement  237 mL Oral BID BM   heparin  5,000 Units Subcutaneous Q8H   levothyroxine  25 mcg Oral Q0600   melatonin  6 mg Oral QHS   multivitamin with minerals  1 tablet Oral Daily   polyethylene glycol  17 g Oral Once   sodium chloride flush  3 mL Intravenous Q12H   sodium zirconium cyclosilicate  10 g Oral Daily   Continuous Infusions:   LOS: 16 days   Hughie Closs, MD [facility]  04/29/2023, 11:31 AM   *Please note that this is a verbal dictation therefore any spelling or grammatical errors are due to the "[employer] One" system interpretation.  Please page via Amion and do not message via secure chat for urgent patient care matters. Secure chat can be used for non urgent patient care matters.  How to contact the [facility] Attending or Consulting provider 7A - 7P or covering provider during after hours 7P -7A, for this patient?  Check the care team in [facility] and look for a) attending/consulting TR[facility] provider listed and b) the [facility] team listed. Page or secure chat 7A-7P. Log into [email or web address] and use [place]'s universal password to access. If you do not have the password, please contact the hospital operator. Locate the [facility] provider you are looking for under [facility] and page to a number that you can be directly reached. If you still have difficulty reaching the provider, please page the  [facility] (Director on Call) for the Hospitalists listed on amion for assistance.

## 2023-04-29 NOTE — Progress Notes (Signed)
Physical Therapy Treatment Patient Details Name: Laura Wilcox MRN: [id number] DOB: 17-Aug-1926 Today's Date: 04/29/2023   History of Present Illness 89 y.o. female sent in from her memory care unit 1/12 secondary to pain in L foot/ankle. Vascular consulted and recommended transfer to [facility] for lower extremity angiogram. Now s/p LLE angiogram and stent L superficial popliteal artery 1/14.  Underwent L LE excisional debridement and placement of myriad and wound vac on 04/21/23.  She returned to the OR for wound vac change 1/27. PMH: advanced dementia, polycythemia vera, chronic myelofibrosis on oral chemotherapy, L ankle hematoma and laceration/fall 04/2023, s/p I&D L ankle 04/02/2023 at [facility].    PT Comments  Pt doing well today. Reporting fatigue but willing to participate in therapy. Mod assist bed mobility, mod assist sit to stand, and mod assist SPT with RW. Pt unable to tolerate WB through LLE for gait progression. Pt in recliner with feet elevated at end of session.     If plan is discharge home, recommend the following: Two people to help with walking and/or transfers;Help with stairs or ramp for entrance;Assist for transportation;Assistance with cooking/housework;A lot of help with bathing/dressing/bathroom   Can travel by private vehicle     No  Equipment Recommendations  None recommended by PT    Recommendations for Other Services       Precautions / Restrictions Precautions Precautions: Fall;Other (comment) Precaution Comments: L LE wound vac, very fragile tender skin     Mobility  Bed Mobility Overal bed mobility: Needs Assistance Bed Mobility: Supine to Sit     Supine to sit: HOB elevated, Mod assist     General bed mobility comments: increased time, encouragement    Transfers Overall transfer level: Needs assistance Equipment used: Rolling walker (2 wheels) Transfers: Sit to/from Stand, Bed to chair/wheelchair/BSC Sit to Stand: From elevated surface, Mod  assist Stand pivot transfers: Mod assist              Ambulation/Gait                   Stairs             Wheelchair Mobility     Tilt Bed    Modified Rankin (Stroke Patients Only)       Balance Overall balance assessment: Needs assistance Sitting-balance support: Feet supported, Single extremity supported, No upper extremity supported Sitting balance-Leahy Scale: Fair     Standing balance support: Bilateral upper extremity supported, During functional activity, Reliant on assistive device for balance Standing balance-Leahy Scale: Poor                              Cognition Arousal: Alert Behavior During Therapy: WFL for tasks assessed/performed Overall Cognitive Status: History of cognitive impairments - at baseline                                 General Comments: Pleasantly confused. Daughter present and confirms pt at cognitive baseline.        Exercises General Exercises - Lower Extremity Ankle Circles/Pumps: AROM, AAROM, Right, Left, 5 reps Hip ABduction/ADduction: AAROM, Right, Left, 5 reps    General Comments General comments (skin integrity, edema, etc.): HR into 100s with activity      Pertinent Vitals/Pain Pain Assessment Pain Assessment: Faces Faces Pain Scale: Hurts whole lot Pain Location: LLE with mobility Pain Descriptors / Indicators:  Discomfort, Grimacing, Guarding Pain Intervention(s): Limited activity within patient's tolerance, Monitored during session    Home Living                          Prior Function            PT Goals (current goals can now be found in the care plan section) Acute Rehab PT Goals Patient Stated Goal: rehab then back to ALF per daughter PT Goal Formulation: With family Time For Goal Achievement: 05/13/23 Potential to Achieve Goals: Good Progress towards PT goals: Progressing toward goals    Frequency    Min 1X/week      PT Plan       Co-evaluation              AM-PAC PT "6 Clicks" Mobility   Outcome Measure  Help needed turning from your back to your side while in a flat bed without using bedrails?: A Lot Help needed moving from lying on your back to sitting on the side of a flat bed without using bedrails?: A Lot Help needed moving to and from a bed to a chair (including a wheelchair)?: A Lot Help needed standing up from a chair using your arms (e.g., wheelchair or bedside chair)?: A Lot Help needed to walk in hospital room?: Total Help needed climbing 3-5 steps with a railing? : Total 6 Click Score: 10    End of Session Equipment Utilized During Treatment: Gait belt Activity Tolerance: Patient tolerated treatment well Patient left: in chair;with call bell/phone within reach;with family/visitor present Nurse Communication: Mobility status PT Visit Diagnosis: Other abnormalities of gait and mobility (R26.89);Muscle weakness (generalized) (M62.81);Difficulty in walking, not elsewhere classified (R26.2)     Time: 1610-9604 PT Time Calculation (min) (ACUTE ONLY): 26 min  Charges:    $Therapeutic Activity: 23-37 mins PT General Charges $$ ACUTE PT VISIT: 1 Visit                     Ferd Glassing., PT  Office # [phone]    Ilda Foil 04/29/2023, 12:27 PM

## 2023-04-29 NOTE — Consult Note (Signed)
WOC Nurse Consult Note: Reason for Consult: new hematoma RLE Plastic surgery is following LLE for hematoma that has since underwent debridement and placement of MIRAD and VAC last on 1/27.  Bedside nursing reporting new hematoma on the RLE, included plastic surgery in the discussion for care. WOC nursing had seen previous wound  Wound type: hematoma  Pressure Injury POA: NA Measurement: see nursing flow sheet (4.5cm x 2.5cm x 0cm) Wound bed: fluid filled, black, hematoma  Drainage (amount, consistency, odor) none reported; skin intact Periwound: intact  Dressing procedure/placement/frequency: Apply Vaseline to the RLE wound, top with single layer of xeroform gauze, top with light layer of kerlix, change every other day  Discussed POC with plastic surgery and bedside nurse.  Re consult if needed, will not follow at this time. Thanks  Raveena Hebdon [employer], RN,CWOCN, CNS, CWON-AP [phone])

## 2023-04-30 ENCOUNTER — Telehealth: Payer: Self-pay

## 2023-04-30 DIAGNOSIS — L03116 Cellulitis of left lower limb: Secondary | ICD-10-CM | POA: Diagnosis not present

## 2023-04-30 LAB — BASIC METABOLIC PANEL
Anion gap: 13 (ref 5–15)
BUN: 23 mg/dL (ref 8–23)
CO2: 25 mmol/L (ref 22–32)
Calcium: 9.3 mg/dL (ref 8.9–10.3)
Chloride: 95 mmol/L — ABNORMAL LOW (ref 98–111)
Creatinine, Ser: 1.35 mg/dL — ABNORMAL HIGH (ref 0.44–1.00)
GFR, Estimated: 36 mL/min — ABNORMAL LOW (ref >60)
Glucose, Bld: 165 mg/dL — ABNORMAL HIGH (ref 70–99)
Potassium: 3.8 mmol/L (ref 3.5–5.1)
Sodium: 133 mmol/L — ABNORMAL LOW (ref 135–145)

## 2023-04-30 LAB — CBC WITH DIFFERENTIAL/PLATELET
Abs Immature Granulocytes: 0 10*3/uL (ref 0.00–0.07)
Basophils Absolute: 0.5 10*3/uL — ABNORMAL HIGH (ref 0.0–0.1)
Basophils Relative: 3 %
Eosinophils Absolute: 0.5 10*3/uL (ref 0.0–0.5)
Eosinophils Relative: 3 %
HCT: 30.2 % — ABNORMAL LOW (ref 36.0–46.0)
Hemoglobin: 9.2 g/dL — ABNORMAL LOW (ref 12.0–15.0)
Lymphocytes Relative: 8 %
Lymphs Abs: 1.4 10*3/uL (ref 0.7–4.0)
MCH: 27.1 pg (ref 26.0–34.0)
MCHC: 30.5 g/dL (ref 30.0–36.0)
MCV: 88.8 fL (ref 80.0–100.0)
Monocytes Absolute: 1.8 10*3/uL — ABNORMAL HIGH (ref 0.1–1.0)
Monocytes Relative: 10 %
Neutro Abs: 13.4 10*3/uL — ABNORMAL HIGH (ref 1.7–7.7)
Neutrophils Relative %: 76 %
Platelets: 666 10*3/uL — ABNORMAL HIGH (ref 150–400)
RBC: 3.4 MIL/uL — ABNORMAL LOW (ref 3.87–5.11)
RDW: 28.5 % — ABNORMAL HIGH (ref 11.5–15.5)
WBC: 17.6 10*3/uL — ABNORMAL HIGH (ref 4.0–10.5)
nRBC: 0 /100{WBCs}
nRBC: 0.2 % (ref 0.0–0.2)

## 2023-04-30 LAB — GLUCOSE, CAPILLARY
Glucose-Capillary: 128 mg/dL — ABNORMAL HIGH (ref 70–99)
Glucose-Capillary: 89 mg/dL (ref 70–99)
Glucose-Capillary: 97 mg/dL (ref 70–99)

## 2023-04-30 NOTE — Telephone Encounter (Signed)
Pt's daughter, [name] left voicemail at 10:49 am concerned about mother's WBC lab. She stated that it is higher today than it was when she actually had an infection. She stated the hospital wants to discharge her today but she is very concerned about the labs. Pt had a wound vac change today. She requested to talk to [place], [place] or Dr. Ulice Bold to get clarity to make sure she's making the best choice for the patient.   Adv pt that I would send a note to them as they are both in surgery and not here at the clinic and that I would text them to adv I have sent a note. She conveyed understanding.

## 2023-04-30 NOTE — Progress Notes (Signed)
On surgery day 04/28/23, pt returned from OR with left arm hematoma, per dayshift RN. Hematoma marked and Pahwani, MD made aware.

## 2023-04-30 NOTE — Progress Notes (Signed)
Report given to [facility] at [phone] to nurse Tye. All questions answered.

## 2023-04-30 NOTE — Progress Notes (Signed)
Occupational Therapy Treatment Patient Details Name: Laura Wilcox MRN: [id number] DOB: 29-Jan-1927 Today's Date: 04/30/2023   History of present illness 89 y.o. female sent in from her memory care unit 1/12 secondary to pain in L foot/ankle. Vascular consulted and recommended transfer to [facility] for lower extremity angiogram. Now s/p LLE angiogram and stent L superficial popliteal artery 1/14.  Underwent L LE excisional debridement and placement of myriad and wound vac on 04/21/23.  She returned to the OR for wound vac change 1/27. PMH: advanced dementia, polycythemia vera, chronic myelofibrosis on oral chemotherapy, L ankle hematoma and laceration/fall 04/2023, s/p I&D L ankle 04/02/2023 at [facility].   OT comments  Patient pleasantly confused and agreeable to OT session. Patient's daughter present during treatment. Patient able to get to EOB with mod assist, increased time and assistance for LLE. Patient performed sit to stand from EOB to RW and was unable to transfer with RW due to pain and fear of increased pain with transfer. Face to face transfer performed with mod assist. Patient able to perform UB self care tasks with setup/supervision with frequent cues to stay on tasks. Patient will benefit from continued inpatient follow up therapy, <3 hours/day. Acute OT to continue to follow to address established goals to facilitate DC to next venue of care.       If plan is discharge home, recommend the following:  Two people to help with walking and/or transfers;A lot of help with bathing/dressing/bathroom;Direct supervision/assist for medications management;Supervision due to cognitive status;Direct supervision/assist for financial management;Assist for transportation;Help with stairs or ramp for entrance;Assistance with cooking/housework;Assistance with feeding   Equipment Recommendations  Other (comment) (defer)    Recommendations for Other Services      Precautions / Restrictions  Precautions Precautions: Fall;Other (comment) Precaution Comments: L LE wound vac, very fragile tender skin Restrictions Weight Bearing Restrictions Per Provider Order: No       Mobility Bed Mobility Overal bed mobility: Needs Assistance Bed Mobility: Supine to Sit     Supine to sit: HOB elevated, Mod assist     General bed mobility comments: increased time with assistance for LLE, fearful of increased pain with movement    Transfers Overall transfer level: Needs assistance Equipment used: Rolling walker (2 wheels), None Transfers: Sit to/from Stand, Bed to chair/wheelchair/BSC Sit to Stand: From elevated surface, Mod assist Stand pivot transfers: Mod assist         General transfer comment: stood from EOB to RW with mod assist with patient stating too painful to attempt with RW. Face to face transfer performed with mod assist     Balance Overall balance assessment: Needs assistance Sitting-balance support: Feet supported, Single extremity supported, No upper extremity supported Sitting balance-Leahy Scale: Fair Sitting balance - Comments: supervision   Standing balance support: Bilateral upper extremity supported, During functional activity, Reliant on assistive device for balance Standing balance-Leahy Scale: Poor Standing balance comment: difficulty tolerating weight on L foot                           ADL either performed or assessed with clinical judgement   ADL Overall ADL's : Needs assistance/impaired     Grooming: Wash/dry hands;Wash/dry face;Brushing hair;Set up;Sitting Grooming Details (indicate cue type and reason): not attempted in standing due to pain Upper Body Bathing: Set up;Sitting       Upper Body Dressing : Set up;Sitting   Lower Body Dressing: Total assistance  Extremity/Trunk Assessment              Vision       Perception     Praxis      Cognition Arousal: Alert Behavior During  Therapy: WFL for tasks assessed/performed Overall Cognitive Status: History of cognitive impairments - at baseline                                 General Comments: pleasantly confused required cues to stay on tasks        Exercises      Shoulder Instructions       General Comments BP 111/87 (92)    Pertinent Vitals/ Pain       Pain Assessment Pain Assessment: Faces Faces Pain Scale: Hurts whole lot Pain Location: LLE with mobility Pain Descriptors / Indicators: Discomfort, Grimacing, Guarding Pain Intervention(s): Limited activity within patient's tolerance, Monitored during session, Premedicated before session, Repositioned  Home Living                                          Prior Functioning/Environment              Frequency  Min 1X/week        Progress Toward Goals  OT Goals(current goals can now be found in the care plan section)  Progress towards OT goals: Progressing toward goals  Acute Rehab OT Goals Patient Stated Goal: none stated OT Goal Formulation: Patient unable to participate in goal setting Time For Goal Achievement: 04/30/23 Potential to Achieve Goals: Fair ADL Goals Pt Will Perform Grooming: with min assist;standing Pt Will Perform Lower Body Dressing: with mod assist;sit to/from stand Pt Will Transfer to Toilet: with min assist;stand pivot transfer  Plan      Co-evaluation                 AM-PAC OT "6 Clicks" Daily Activity     Outcome Measure   Help from another person eating meals?: A Little Help from another person taking care of personal grooming?: A Little Help from another person toileting, which includes using toliet, bedpan, or urinal?: A Lot Help from another person bathing (including washing, rinsing, drying)?: A Lot Help from another person to put on and taking off regular upper body clothing?: A Little Help from another person to put on and taking off regular lower body  clothing?: A Lot 6 Click Score: 15    End of Session Equipment Utilized During Treatment: Gait belt;Rolling walker (2 wheels)  OT Visit Diagnosis: Unsteadiness on feet (R26.81);History of falling (Z91.81);Muscle weakness (generalized) (M62.81);Pain;Other symptoms and signs involving cognitive function Pain - Right/Left: Left Pain - part of body: Ankle and joints of foot   Activity Tolerance Patient tolerated treatment well;Patient limited by pain   Patient Left in chair;with call bell/phone within reach;with chair alarm set;with family/visitor present   Nurse Communication Mobility status        Time: 1610-9604 OT Time Calculation (min): 26 min  Charges: OT General Charges $OT Visit: 1 Visit OT Treatments $Self Care/Home Management : 23-37 mins  Alfonse Flavors, OTA Acute Rehabilitation Services  Office [phone]   Dewain Penning 04/30/2023, 12:42 PM

## 2023-04-30 NOTE — TOC Progression Note (Signed)
Transition of Care [facility]) - Progression Note    Patient Details  Name: Laura Wilcox MRN: [id number] Date of Birth: 02-Dec-1926  Transition of [facility]) CM/SW Contact  Eduard Roux, [place] Phone Number: 04/30/2023, 3:02 PM  Clinical Narrative:     PTAR called   Expected Discharge Plan: Skilled Nursing Facility Barriers to Discharge: Barriers Resolved  Expected Discharge Plan and Services In-house Referral: Clinical Social Work     Living arrangements for the past 2 months: Assisted Living Facility Expected Discharge Date: 04/30/23                                     Social Determinants of Health (SDOH) Interventions SDOH Screenings   Food Insecurity: No Food Insecurity (04/13/2023)  Housing: Low Risk  (04/13/2023)  Transportation Needs: No Transportation Needs (04/13/2023)  Utilities: Not At Risk (04/13/2023)  Alcohol Screen: Low Risk  (06/18/2021)  Depression (PHQ2-9): High Risk (06/18/2021)  Social Connections: Unknown (04/13/2023)  Tobacco Use: Low Risk  (04/28/2023)    Readmission Risk Interventions    06/26/2021    4:26 PM  Readmission Risk Prevention Plan  Transportation Screening Complete  PCP or Specialist Appt within 5-7 Days Complete  Home Care Screening Complete  Medication Review (RN CM) Complete

## 2023-04-30 NOTE — TOC Transition Note (Addendum)
Transition of Care [facility]) - Discharge Note   Patient Details  Name: Laura Wilcox MRN: [id number] Date of Birth: 04-03-1926  Transition of Care [facility]) CM/SW Contact:  Eduard Roux, LCSW Phone Number: 04/30/2023, 12:28 PM   Clinical Narrative:     Patient will Discharge to: [facility]  Discharge Date: 04/30/23 Family Notified: daughter  Transport By: Sharin Mons  Per MD patient is ready for discharge. RN, patient, and facility notified of discharge. Discharge Summary sent to facility. RN given number for [phone]), room 602-B. Ambulance transport requested for patient.   Clinical Social Worker signing off.  Antony Blackbird, MSW, LCSW Clinical Social Worker     Final next level of care: Skilled Nursing Facility Barriers to Discharge: Barriers Resolved   Patient Goals and CMS Choice            Discharge Placement              Patient chooses bed at:  ([facility]) Patient to be transferred to facility by: PTAR Name of family member notified: daughter Patient and family notified of of transfer: 04/30/23  Discharge Plan and Services Additional resources added to the After Visit Summary for   In-house Referral: Clinical Social Work                                   Social Drivers of Health (SDOH) Interventions SDOH Screenings   Food Insecurity: No Food Insecurity (04/13/2023)  Housing: Low Risk  (04/13/2023)  Transportation Needs: No Transportation Needs (04/13/2023)  Utilities: Not At Risk (04/13/2023)  Alcohol Screen: Low Risk  (06/18/2021)  Depression (PHQ2-9): High Risk (06/18/2021)  Social Connections: Unknown (04/13/2023)  Tobacco Use: Low Risk  (04/28/2023)     Readmission Risk Interventions    06/26/2021    4:26 PM  Readmission Risk Prevention Plan  Transportation Screening Complete  PCP or Specialist Appt within 5-7 Days Complete  Home Care Screening Complete  Medication Review (RN CM) Complete

## 2023-04-30 NOTE — Progress Notes (Signed)
Prior to clearing the patient for discharge, I spoke to the daughter [name] over the phone.  I informed her that per [facility], all arrangements have been made for her to go back to the SNF.  She told me that she could not see the CBC results today and asked me what her WBCs were.  I told her that WBC is 7 point 0.6 compared to 18.7 yesterday which means they are improved.  She did not have any further questions at that time.  10 minutes later, social worker told me that the daughter wants me to give her a call and was wondering about WBC.  This was exact topic that I had discussed with her.  In addition to this, I met with her in person yesterday 04/29/2023, reassured her that patient's WBCs are improving and the fact that patient has no fever and plastic surgery is also not concerned about infection, these are not signs of infection and hopefully her white cells will improve since clinically she does not appear to have any infection.  And then I received a message from floor manager Marylene Land to give her a call.  I spoke to [place] who had me speak to the daughter over the speaker phone.  Daughter was upset and was overwhelmed and despite of me repeating what I had already mentioned and trying to reassure her, she remained overwhelmed and upset.  One of the concerns she had that patient is more delirious today than baseline.  However patient has remained alert and oriented to self, this is her baseline and she is from memory care unit.  All of those were addressed with the nursing department.

## 2023-04-30 NOTE — Progress Notes (Signed)
Mobility Specialist Progress Note:    04/30/23 1117  Mobility  Activity Repositioned in chair (+2)  Level of Assistance Minimal assist, patient does 75% or more  Activity Response Tolerated well  Mobility Referral Yes  Mobility visit 1 Mobility  Mobility Specialist Start Time (ACUTE ONLY) 1112  Mobility Specialist Stop Time (ACUTE ONLY) 1117  Mobility Specialist Time Calculation (min) (ACUTE ONLY) 5 min   Responded to pt chair alarm, pt attempting to exit chair. Easy to redirect, pt pleasantly confused.n Assisted in repositioning in chair with MinA+2. Tolerated well, daughter in room, left with all needs met.   Feliciana Rossetti Mobility Specialist Please contact via Special educational needs teacher or  Rehab office at [phone]

## 2023-04-30 NOTE — Discharge Summary (Signed)
Physician Discharge Summary  Laura Wilcox RUE:[id number] DOB: 07-10-1926 DOA: 04/13/2023  PCP: Smiley Houseman, NP  Admit date: 04/13/2023 Discharge date: 04/30/2023 30 Day Unplanned Readmission Risk Score    Flowsheet Row Admission (Current) from 04/13/2023 in [facility] 4E CV SURGICAL PROGRESSIVE CARE  30 Day Unplanned Readmission Risk Score (%) 34.11 Filed at 04/30/2023 0801       This score is the patient's risk of an unplanned readmission within 30 days of being discharged (0 -100%). The score is based on dignosis, age, lab data, medications, orders, and past utilization.   Low:  0-14.9   Medium: 15-21.9   High: 22-29.9   Extreme: 30 and above          Admitted From: SNF Disposition: SNF  Recommendations for Outpatient Follow-up:  Follow up with PCP in 1-2 weeks Please obtain BMP/CBC in one week Follow-up with plastic surgery for wound VAC exchange.  They will set up appointments for you. Please follow up with your PCP on the following pending results: Unresulted Labs (From admission, onward)    None         Home Health: None Equipment/Devices: Wound VAC  Discharge Condition: Stable CODE STATUS: DNR Diet recommendation: Cardiac  Subjective: Seen and examined.  Patient has no complaints.  Brief/Interim Summary: Patient is a 89 year old female with past medical history significant for advanced dementia, polycythemia vera, chronic myelofibrosis on oral chemotherapy, hypothyroidism who presented from her memory care unit to [facility] ED on 04/13/2023 with severe pain to her left foot and nonhealing left lower leg wound.  Apparently, on previous admission, but recent (04/02/2023), patient had undergone I&D with debridement of skin and soft tissue of the left ankle by Dr. Myra Gianotti.  Patient has undergone angiogram, myriad wound matrix and wound VAC placement to nonhealing left ankle wound.  Worsening leukocytosis noted, patient was started on IV Rocephin on 04/24/2023, and completed 4  days and this was discontinued per consultant recommendations.  Further details below.  L lower ext nonhealing wound w/ cellulitis  - She has underwent initial debridement and wound VAC placement 1 week ago with subsequent wound VAC change and additional myriad wound placement yesterday 04/28/2023. She is doing well today without any concerns. Patient's daughter has been notified of updates related to left lower extremity VAC change and Molli Hazard, [place] with plastic surgery and Dr. Ulice Bold.  They will arrange all of that for her.  Patient leukocytosis is improving.   Advanced dementia  - Complicating care, patient at baseline.   Myelofibrosis  -Resume home medications   Hypothyroidism  - Synthroid 25 mcg PO daily    Anxiety/depression  - Celexa 10 mg PO daily    Weakness and debility  - PT have recommended SNF placement, patient is being discharged today.   Hyperkalemia: Resolved   Intermittent hyponatremia: Stable.  Asymptomatic.  Right lower extremity and left arm hematoma: Patient seen by plastic surgery yesterday, they recommended Vaseline and Xeroform dressing, secure with Kerlix or Mepilex border dressing pending patient comfort.  And patient's hemoglobin is relatively stable.  Discharge plan was discussed with patient and/or family member and they verbalized understanding and agreed with it.  Discharge Diagnoses:  Principal Problem:   Cellulitis Active Problems:   Senile dementia with acute confusional state, without behavioral disturbance (HCC)   Polycythemia rubra vera (HCC)   Chronic kidney disease, stage 3b (HCC)   Myelofibrosis (HCC)   Hematoma of left ankle   Wound of left leg    Discharge Instructions  Allergies as of 04/30/2023       Reactions   Bactrim [sulfamethoxazole-trimethoprim]    Due to kidney disease    Epinephrine Other (See Comments)   Other    Per daughter RUE BP significantly higher than LUE        Medication List     TAKE these  medications    acetaminophen 325 MG tablet Commonly known as: TYLENOL Take 325-650 mg by mouth every 6 (six) hours as needed for mild pain or moderate pain.   ammonium lactate 12 % cream Commonly known as: AMLACTIN Apply topically qd/bid to dry areas at lower legs and arms What changed:  how much to take how to take this when to take this   aspirin EC 81 MG tablet Take 81 mg by mouth daily. Swallow whole.   calcitRIOL 0.25 MCG capsule Commonly known as: ROCALTROL Take 0.25 mcg by mouth every Monday, Wednesday, and Friday.   citalopram 10 MG tablet Commonly known as: CELEXA Take 10 mg by mouth daily.   cyanocobalamin 1000 MCG tablet Commonly known as: VITAMIN B12 Take 1 tablet (1,000 mcg total) by mouth daily.   hydrocortisone 2.5 % cream Apply topically twice daily to aa of right cheek as needed for itch What changed:  how much to take how to take this when to take this reasons to take this additional instructions   ipratropium-albuterol 0.5-2.5 (3) MG/3ML Soln Commonly known as: DUONEB Take 3 mLs by nebulization every 4 (four) hours as needed. What changed: reasons to take this   Iron-Vitamin C 65-125 MG Tabs Take 1 tablet by mouth daily.   Jakafi 5 MG tablet Generic drug: ruxolitinib phosphate Take 2 tablets in the morning and 1 tablet in the evening as directed. (Take 1 tablet (5 mg total) by mouth See admin instructions. Jakafi 10 mg in the morning, 5 mg in the evening.) What changed:  how much to take additional instructions   levothyroxine 25 MCG tablet Commonly known as: SYNTHROID Take 25 mcg by mouth daily.   multivitamin with minerals Tabs tablet Take 1 tablet by mouth daily.   mupirocin ointment 2 % Commonly known as: BACTROBAN Apply topically to wound at right jawline qd and cover with bandage until healed. Can use at any other crusted areas on legs and open wounds. What changed:  how much to take how to take this when to take this    oxyCODONE 5 MG immediate release tablet Commonly known as: Oxy IR/ROXICODONE Take 1 tablet (5 mg total) by mouth every 6 (six) hours as needed for severe pain (pain score 7-10).   polyethylene glycol 17 g packet Commonly known as: MIRALAX / GLYCOLAX Take 17 g by mouth daily as needed for mild constipation.   traZODone 50 MG tablet Commonly known as: DESYREL Take 25 mg by mouth at bedtime as needed.   Vitamin D3 25 MCG (1000 UT) Caps Take 1 capsule by mouth daily.        Follow-up Information     Nada Libman, MD Follow up in 6 week(s).   Specialties: Vascular Surgery, Cardiology Why: Office will call you to arrange your appt (sent). Contact information: [address] [place] [address] [phone]         Smiley Houseman, NP Follow up in 1 week(s).   Specialty: Nurse Practitioner Contact information: [address] [place] [address] [phone]                Allergies  Allergen Reactions   Bactrim [Sulfamethoxazole-Trimethoprim]     Due to kidney disease    Epinephrine Other (See Comments)   Other     Per daughter RUE BP significantly higher than LUE    Consultations: Plastic surgery, vascular surgery   Procedures/Studies: DG CHEST PORT 1 VIEW Result Date: 04/27/2023 CLINICAL DATA:  Leukocytosis. EXAM: PORTABLE CHEST 1 VIEW COMPARISON:  09/24/2021. FINDINGS: The heart size and mediastinal contours are stable. There is atherosclerotic calcification of the aorta. A large hiatal hernia is noted. Atelectasis or infiltrate is present at the left lung base. There is blunting of the left costophrenic angle, possible small pleural effusion. There is a fracture of the T9 rib on the right which appears chronic. Degenerative changes are noted in the thoracic spine. IMPRESSION: 1. Patchy atelectasis or infiltrate at the left lung base. 2. Suspected small left pleural effusion. 3. Large hiatal hernia. Electronically Signed   By: Thornell Sartorius M.D.   On: 04/27/2023 20:50   PERIPHERAL VASCULAR CATHETERIZATION Result Date: 04/15/2023 Images from the original result were not included. Patient name: Laura Wilcox MRN: [id number] DOB: 22-Jan-1927 Sex: female 04/15/2023 Pre-operative Diagnosis: Left leg ulcer Post-operative diagnosis:  Same Surgeon:  Durene Cal Procedure Performed:  1.  Ultrasound-guided access, right femoral artery  2.  Abdominal aortogram  3.  Left lower extremity angiogram  4.  Selective imaging with cath in the left popliteal artery  5.  Stent, left superficial femoral-popliteal artery  6.  Conscious sedation, 54 minutes  7.  Closure device, Celt Indications: This is a 89 year old female who suffered a fall about 2 weeks ago with a significant left ankle hematoma requiring surgical evacuation creating a large wound.  She has had deterioration of the wound.  ABIs were suboptimal for healing and so she comes in today for angiography. Procedure:  The patient was identified in the holding area and taken to room 8.  The patient was then placed supine on the table and prepped and draped in the usual sterile fashion.  A time out was called.  Conscious sedation was administered with the use of IV fentanyl and Versed under continuous physician and nurse monitoring.  Heart rate, blood pressure, and oxygen saturation were continuously monitored.  Total sedation time was 54 minutes.  Ultrasound was used to evaluate the right common femoral artery.  It was patent .  A digital ultrasound image was acquired.  A micropuncture needle was used to access the right common femoral artery under ultrasound guidance.  An 018 wire was advanced without resistance and a micropuncture sheath was placed.  The 018 wire was removed and a benson wire was placed.  The micropuncture sheath was exchanged for a 5 french sheath.  An omniflush catheter was advanced over the wire to the level of L-1.  An abdominal angiogram was obtained.  Next, using the omniflush  catheter and a benson wire, the aortic bifurcation was crossed and the catheter was placed into theleft external iliac artery and left runoff was obtained.  Findings:  Aortogram: No significant renal stenosis identified.  The infrarenal abdominal aorta is calcified but patent without significant stenosis.  Greater than 50% right common iliac stenosis.  The left common and external iliac and the right extrailiac artery are widely patent.  Right Lower Extremity: The right common femoral and profundofemoral artery are patent throughout the course.  The superficial femoral artery is occluded just beyond its origin.  There is a long segment occlusion with reconstitution of the above-knee  popliteal artery and single-vessel runoff via the peroneal artery.  Left Lower Extremity: Not evaluated Intervention: After the above images were acquired the decision was made to proceed with intervention.  A 6 x 45 sheath was inserted into the left external iliac artery.  The patient was fully heparinized.  Next, using a 035 Glidewire and a quick cross catheter, subintimal recanalization was performed.  A contrast injection was performed with the catheter in the above-knee popliteal artery, confirming successful crossing of the lesion.  Next, a 035 versa core wire was placed.  The subintimal track was dilated with a 4 mm balloon and then overlapping 6 mm Eluvia stents were deployed to treat the entire length of the lesion (6 x 150 x 2, 6 x 40).  The stents were dilated with a 5 mm balloon.  There was a residual waist up near the origin of the stents which was treated again with balloon angioplasty.  Completion imaging showed residual in-stent narrowing in the proximal portion of the stents.  The remaining portion of the stents are widely patent with preserved single-vessel runoff via the peroneal artery.  The long sheath was exchanged out for a short 6 French sheath and a Celt was sutured closure without complication Impression:  #1   Occluded right superficial femoral artery, successfully crossed and treated with primary stenting using 6 mm drug-eluting Eluvia stents.  #2  Single-vessel runoff via the peroneal artery  #3  Will only treat with 81 mg of aspirin and not Plavix given her high risk of bleeding and frailty.  Juleen China, M.D., [facility] Office: [phone] Pager:  [phone]   DG Ankle Complete Left Result Date: 04/13/2023 CLINICAL DATA:  Fall yesterday.  History of left ankle hematoma. EXAM: LEFT ANKLE COMPLETE - 3+ VIEW COMPARISON:  03/31/2023 FINDINGS: Bones are diffusely demineralized. No evidence for an acute fracture. No subluxation or dislocation. No worrisome lytic or sclerotic osseous abnormality. IMPRESSION: Negative. Electronically Signed   By: Kennith Center M.D.   On: 04/13/2023 09:49   US ARTERIAL ABI (SCREENING LOWER EXTREMITY) Result Date: 04/05/2023 CLINICAL DATA:  Delayed wound healing EXAM: NONINVASIVE PHYSIOLOGIC VASCULAR STUDY OF BILATERAL LOWER EXTREMITIES TECHNIQUE: Evaluation of both lower extremities were performed at rest, including calculation of ankle-brachial indices with single level pressure measurements and doppler recording. COMPARISON:  None available. FINDINGS: Right ABI:  0.65 Left ABI:  0.46 Right Lower Extremity: Monophasic waveforms of the posterior tibial and dorsalis pedis arteries. Left Lower Extremity: Monophasic waveforms of the posterior tibial dorsalis pedis arteries. < 0.5 Severe PAD IMPRESSION: 1. Severe peripheral arterial disease of the left lower extremity. 2. Moderate peripheral arterial disease of the right lower extremity. 3. Significant difference in BP between upper extremities with left brachial pressure measuring 77 mm Hg and right brachial pressure measuring 101 mm Hg. Findings suspicious for left subclavian/axillary artery stenosis. Repeat bilateral upper extremity BP measurements should be obtained two confirmed this is  reproducible. Electronically Signed   By: Acquanetta Belling M.D.   On: 04/05/2023 11:55     Discharge Exam: Vitals:   04/30/23 0452 04/30/23 0833  BP:  111/66  Pulse: 78 80  Resp: 16 16  Temp: 97.9 F (36.6 C) 98.1 F (36.7 C)  SpO2: 93% 92%   Vitals:   04/29/23 2011 04/30/23 0040 04/30/23 0452 04/30/23 0833  BP: 119/63 133/69  111/66  Pulse: 93 92 78 80  Resp: 16 16 16 16   Temp: 97.9 F (36.6 C)  98 F (36.7 C) 97.9 F (36.6 C) 98.1 F (36.7 C)  TempSrc: Oral Oral Oral Oral  SpO2: 95% 94% 93% 92%  Weight:      Height:        General: Pt is alert, awake, not in acute distress Cardiovascular: RRR, S1/S2 +, no rubs, no gallops Respiratory: CTA bilaterally, no wheezing, no rhonchi Abdominal: Soft, NT, ND, bowel sounds + Extremities: Dressing in the left lower extremity, hematoma and dressing in the right lower extremity.  Hematoma in the left upper extremity.    The results of significant diagnostics from this hospitalization (including imaging, microbiology, ancillary and laboratory) are listed below for reference.     Microbiology: No results found for this or any previous visit (from the past 240 hours).   Labs: BNP (last 3 results) No results for input(s): "BNP" in the last 8760 hours. Basic Metabolic Panel: Recent Labs  Lab 04/25/23 0319 04/27/23 0855 04/28/23 0310 04/28/23 1508 04/29/23 1158 04/30/23 0906  NA 138 137 131*  --  133* 133*  K 4.3 4.3 5.8* 4.9 4.7 3.8  CL 98 99 99  --  96* 95*  CO2 27 26 23   --  25 25  GLUCOSE 89 83 394*  --  104* 165*  BUN 15 12 17   --  23 23  CREATININE 1.14* 1.12* 1.29*  --  1.54* 1.35*  CALCIUM 9.4 9.5 8.3*  --  9.4 9.3  MG 1.8 2.0 1.8  --   --   --   PHOS  --   --  3.3  --   --   --    Liver Function Tests: Recent Labs  Lab 04/25/23 0319 04/27/23 0855 04/28/23 0310  AST 40 28 19  ALT 20 21 18   ALKPHOS 82 88 79  BILITOT 0.5 0.5 0.3  PROT 5.7* 6.2* 5.3*  ALBUMIN 2.6* 2.8* 2.4*   No results for input(s):  "LIPASE", "AMYLASE" in the last 168 hours. No results for input(s): "AMMONIA" in the last 168 hours. CBC: Recent Labs  Lab 04/25/23 0319 04/27/23 0855 04/28/23 0310 04/29/23 1104 04/30/23 0906  WBC 15.9* 19.8* 18.9* 18.7* 17.6*  NEUTROABS  --   --   --  12.3* PENDING  HGB 8.6* 9.9* 8.7* 10.0* 9.2*  HCT 28.3* 32.9* 29.0* 34.4* 30.2*  MCV 88.2 88.7 89.2 90.3 88.8  PLT 677* 713* 651* 676* 666*   Cardiac Enzymes: No results for input(s): "CKTOTAL", "CKMB", "CKMBINDEX", "TROPONINI" in the last 168 hours. BNP: Invalid input(s): "POCBNP" CBG: Recent Labs  Lab 04/28/23 2344 04/29/23 0543 04/29/23 1810 04/30/23 0008 04/30/23 0634  GLUCAP 113* 96 106* 97 89   D-Dimer No results for input(s): "DDIMER" in the last 72 hours. Hgb A1c No results for input(s): "HGBA1C" in the last 72 hours. Lipid Profile No results for input(s): "CHOL", "HDL", "LDLCALC", "TRIG", "CHOLHDL", "LDLDIRECT" in the last 72 hours. Thyroid function studies No results for input(s): "TSH", "T4TOTAL", "T3FREE", "THYROIDAB" in the last 72 hours.  Invalid input(s): "FREET3" Anemia work up No results for input(s): "VITAMINB12", "FOLATE", "FERRITIN", "TIBC", "IRON", "RETICCTPCT" in the last 72 hours. Urinalysis    Component Value Date/Time   COLORURINE YELLOW 04/27/2023 1859   APPEARANCEUR CLEAR 04/27/2023 1859   LABSPEC 1.024 04/27/2023 1859   PHURINE 6.0 04/27/2023 1859   GLUCOSEU NEGATIVE 04/27/2023 1859   HGBUR NEGATIVE 04/27/2023 1859   BILIRUBINUR NEGATIVE 04/27/2023 1859   BILIRUBINUR neg 03/30/2021 1430   KETONESUR 5 (A) 04/27/2023 1859   PROTEINUR NEGATIVE  04/27/2023 1859   UROBILINOGEN 0.2 03/30/2021 1430   NITRITE NEGATIVE 04/27/2023 1859   LEUKOCYTESUR NEGATIVE 04/27/2023 1859   Sepsis Labs Recent Labs  Lab 04/27/23 0855 04/28/23 0310 04/29/23 1104 04/30/23 0906  WBC 19.8* 18.9* 18.7* 17.6*   Microbiology No results found for this or any previous visit (from the past 240  hours).  FURTHER DISCHARGE INSTRUCTIONS:   Get Medicines reviewed and adjusted: Please take all your medications with you for your next visit with your Primary MD   Laboratory/radiological data: Please request your Primary MD to go over all hospital tests and procedure/radiological results at the follow up, please ask your Primary MD to get all Hospital records sent to his/her office.   In some cases, they will be blood work, cultures and biopsy results pending at the time of your discharge. Please request that your primary care M.D. goes through all the records of your hospital data and follows up on these results.   Also Note the following: If you experience worsening of your admission symptoms, develop shortness of breath, life threatening emergency, suicidal or homicidal thoughts you must seek medical attention immediately by calling 911 or calling your MD immediately  if symptoms less severe.   You must read complete instructions/literature along with all the possible adverse reactions/side effects for all the Medicines you take and that have been prescribed to you. Take any new Medicines after you have completely understood and accpet all the possible adverse reactions/side effects.    Do not drive when taking Pain medications or sleeping medications (Benzodaizepines)   Do not take more than prescribed Pain, Sleep and Anxiety Medications. It is not advisable to combine anxiety,sleep and pain medications without talking with your primary care practitioner   Special Instructions: If you have smoked or chewed Tobacco  in the last 2 yrs please stop smoking, stop any regular Alcohol  and or any Recreational drug use.   Wear Seat belts while driving.   Please note: You were cared for by a hospitalist during your hospital stay. Once you are discharged, your primary care physician will handle any further medical issues. Please note that NO REFILLS for any discharge medications will be  authorized once you are discharged, as it is imperative that you return to your primary care physician (or establish a relationship with a primary care physician if you do not have one) for your post hospital discharge needs so that they can reassess your need for medications and monitor your lab values  Time coordinating discharge: Over 30 minutes  SIGNED:   Hughie Closs, MD  [facility] 04/30/2023, 11:59 AM *Please note that this is a verbal dictation therefore any spelling or grammatical errors are due to the "[employer] One" system interpretation. If 7PM-7AM, please contact night-coverage [email or web address]

## 2023-04-30 NOTE — Discharge Instructions (Addendum)
Patient has right lower extremity and left upper extremity hematoma.  She was assessed by plastic surgery and they recommended  Vaseline and Xeroform dressing, secure with Kerlix or Mepilex border dressing pending patient comfort.

## 2023-05-01 NOTE — Progress Notes (Signed)
Spoke with Herbert Seta at Dr. Kittie Plater office. Patient is not a day surgery center candidate and will need to be moved to main OR .

## 2023-05-01 NOTE — Telephone Encounter (Signed)
I called and spoke with patient's daughter Laura Wilcox.  I discussed with her that in regards to her left lower extremity wound there is no signs of infection, the white blood cell can be slightly elevated or increased after surgical intervention.  She mentioned that she discussed the white blood cell count with the primary hospitalist team at [facility] prior to discharge and they were confident that it was improving and downtrending and patient was discharged.  She is currently residing at a SNF and seems to be doing well overall, daughter does report some mental status changes, possibly related to recent surgery as she mentioned that the anesthesia sometimes causes her some mental status changes.  I encouraged her to have her mother Laura Wilcox follow-up with PCP for hospital follow-up as well.

## 2023-05-02 ENCOUNTER — Other Ambulatory Visit: Payer: Self-pay

## 2023-05-02 ENCOUNTER — Encounter (HOSPITAL_COMMUNITY): Payer: Self-pay | Admitting: Plastic Surgery

## 2023-05-02 NOTE — Progress Notes (Signed)
SDW call  Patient's daughter [name] was given pre-op instructions over the phone. She verbalized understanding of instructions provided.  Patient lives at [facility], [place], [place].  Pre-op instructions were given to [place], [place] at [phone]. Instructions were faxed to [phone].  They will be transporting patient to and from the hospital for surgery.      PCP - Smiley Houseman, NP Cardiologist -  Pulmonary:    PPM/ICD - denies Device Orders - na Rep Notified - na   Chest x-ray - 04/27/2023 EKG -  04/15/2023 Stress Test - ECHO -  Cardiac Cath -   Sleep Study/sleep apnea/CPAP: denies  Non-diabetic  Blood Thinner Instructions: denies Aspirin Instructions: last dose 05/02/2023   ERAS Protcol - Clears until 1300  Anesthesia review: Yes. Recent admission 12/300/04/11/2023 then needed blood transfusion

## 2023-05-02 NOTE — Progress Notes (Signed)
.Surgical Instructions   Your procedure is scheduled on Monday May 05, 2023. Report to [facility] Main Entrance "A" at 1:30 P.M., then check in with the Admitting office. Any questions or running late day of surgery: call [phone]  Questions prior to your surgery date: call [phone], Monday-Friday, 8am-4pm. If you experience any cold or flu symptoms such as cough, fever, chills, shortness of breath, etc. between now and your scheduled surgery, please notify us at the above number.    Remember:  Do not eat after midnight the night before your surgery   You may drink clear liquids until 1:00 P.M. the day of your surgery.   Clear liquids allowed are: Water, Non-Citrus Juices (without pulp), Carbonated Beverages, Clear Tea (no milk, honey, etc.), Black Coffee Only (NO MILK, CREAM OR POWDERED CREAMER of any kind), and Gatorade.  Take these medicines the morning of surgery with A SIP OF WATER  citalopram (CELEXA)  levothyroxine (SYNTHROID)  ruxolitinib phosphate (JAKAFI)   May take these medicines IF NEEDED: acetaminophen (TYLENOL)  ipratropium-albuterol (DUONEB) 0.5-2.5 (3) MG/3ML SOLN   One week prior to surgery, STOP taking any Aspirin (unless otherwise instructed by your surgeon) Aleve, Naproxen, Ibuprofen, Motrin, Advil, Goody's, BC's, all herbal medications, fish oil, and non-prescription vitamins.                     Do NOT Smoke (Tobacco/Vaping) for 24 hours prior to your procedure.  If you use a CPAP at night, you may bring your mask/headgear for your overnight stay.   You will be asked to remove any contacts, glasses, piercing's, hearing aid's, dentures/partials prior to surgery. Please bring cases for these items if needed.    Patients discharged the day of surgery will not be allowed to drive home, and someone needs to stay with them for 24 hours.  SURGICAL WAITING ROOM VISITATION Patients may have no more than 2 support people in the waiting area - these  visitors may rotate.   Pre-op nurse will coordinate an appropriate time for 1 ADULT support person, who may not rotate, to accompany patient in pre-op.  Children under the age of 25 must have an adult with them who is not the patient and must remain in the main waiting area with an adult.  If the patient needs to stay at the hospital during part of their recovery, the visitor guidelines for inpatient rooms apply.  Please refer to the [facility] website for the visitor guidelines for any additional information.   If you received a COVID test during your pre-op visit  it is requested that you wear a mask when out in public, stay away from anyone that may not be feeling well and notify your surgeon if you develop symptoms. If you have been in contact with anyone that has tested positive in the last 10 days please notify you surgeon.      Pre-operative Bathing Instructions   You can play a key role in reducing the risk of infection after surgery. Your skin needs to be as free of germs as possible. You can reduce the number of germs on your skin by washing with Dial soap before surgery.    [phone].              TAKE A SHOWER THE NIGHT BEFORE SURGERY AND THE DAY OF SURGERY    Please keep in mind the following:  DO NOT shave, including legs and underarms, 48 hours prior to surgery.   Place  clean sheets on your bed the night before surgery Use a clean washcloth (not used since being washed) for each shower.  Shower Instructions:  Oncologist and private area with Dial soap and rinse thoroughly.  2.   Pat dry with a clean towel  3.   Put on clean pajamas    Additional instructions for the day of surgery: DO NOT APPLY any lotions, deodorants or perfumes.   Do not wear jewelry or makeup Do not wear nail polish, gel polish, artificial nails, or any other type of covering on natural nails (fingers and toes) Do not bring valuables to the hospital. [facility] is not responsible for  valuables/personal belongings. Put on clean/comfortable clothes.  Please brush your teeth.  Ask your nurse before applying any prescription medications to the skin.

## 2023-05-05 ENCOUNTER — Ambulatory Visit (HOSPITAL_COMMUNITY): Payer: Medicare Other | Admitting: Anesthesiology

## 2023-05-05 ENCOUNTER — Ambulatory Visit (HOSPITAL_BASED_OUTPATIENT_CLINIC_OR_DEPARTMENT_OTHER): Payer: Medicare Other | Admitting: Anesthesiology

## 2023-05-05 ENCOUNTER — Encounter (HOSPITAL_COMMUNITY)
Admission: RE | Disposition: A | Discharge status: Home / Self Care | Payer: Self-pay | Source: Home / Self Care | Attending: Plastic Surgery

## 2023-05-05 ENCOUNTER — Other Ambulatory Visit: Payer: Self-pay

## 2023-05-05 ENCOUNTER — Ambulatory Visit (HOSPITAL_COMMUNITY)
Admission: RE | Admit: 2023-05-05 | Discharge: 2023-05-05 | Disposition: A | Discharge status: Home / Self Care | Payer: Medicare Other | Attending: Plastic Surgery | Admitting: Plastic Surgery

## 2023-05-05 ENCOUNTER — Encounter (HOSPITAL_COMMUNITY): Payer: Self-pay | Admitting: Plastic Surgery

## 2023-05-05 DIAGNOSIS — D7581 Myelofibrosis: Secondary | ICD-10-CM | POA: Insufficient documentation

## 2023-05-05 DIAGNOSIS — F0390 Unspecified dementia without behavioral disturbance: Secondary | ICD-10-CM | POA: Insufficient documentation

## 2023-05-05 DIAGNOSIS — F32A Depression, unspecified: Secondary | ICD-10-CM | POA: Insufficient documentation

## 2023-05-05 DIAGNOSIS — D45 Polycythemia vera: Secondary | ICD-10-CM | POA: Insufficient documentation

## 2023-05-05 DIAGNOSIS — E039 Hypothyroidism, unspecified: Secondary | ICD-10-CM | POA: Insufficient documentation

## 2023-05-05 DIAGNOSIS — Z79890 Hormone replacement therapy (postmenopausal): Secondary | ICD-10-CM | POA: Insufficient documentation

## 2023-05-05 DIAGNOSIS — Z79899 Other long term (current) drug therapy: Secondary | ICD-10-CM | POA: Diagnosis not present

## 2023-05-05 DIAGNOSIS — Z79622 Long term (current) use of janus kinase inhibitor: Secondary | ICD-10-CM | POA: Insufficient documentation

## 2023-05-05 DIAGNOSIS — S81802A Unspecified open wound, left lower leg, initial encounter: Secondary | ICD-10-CM

## 2023-05-05 DIAGNOSIS — X58XXXA Exposure to other specified factors, initial encounter: Secondary | ICD-10-CM | POA: Insufficient documentation

## 2023-05-05 HISTORY — PX: APPLICATION OF WOUND VAC: SHX5189

## 2023-05-05 SURGERY — APPLICATION, WOUND VAC
Anesthesia: General

## 2023-05-05 MED ORDER — PROPOFOL 10 MG/ML IV BOLUS
INTRAVENOUS | Status: DC | PRN
  Administered 2023-05-05: 70 mg via INTRAVENOUS
  Administered 2023-05-05: 75 ug/kg/min via INTRAVENOUS

## 2023-05-05 MED ORDER — ONDANSETRON HCL 4 MG/2ML IJ SOLN
INTRAMUSCULAR | Status: DC | PRN
  Administered 2023-05-05: 4 mg via INTRAVENOUS

## 2023-05-05 MED ORDER — SODIUM CHLORIDE 0.9% FLUSH: 3.0000 mL | Freq: Two times a day (BID) | INTRAVENOUS | Status: DC

## 2023-05-05 MED ORDER — LIDOCAINE 2% (20 MG/ML) 5 ML SYRINGE
INTRAMUSCULAR | Status: DC | PRN
  Administered 2023-05-05: 60 mg via INTRAVENOUS

## 2023-05-05 MED ORDER — FENTANYL CITRATE (PF) 100 MCG/2ML IJ SOLN
INTRAMUSCULAR | Status: AC
  Administered 2023-05-05: 25 ug via INTRAVENOUS
  Filled 2023-05-05: qty 2

## 2023-05-05 MED ORDER — CEFAZOLIN SODIUM-DEXTROSE 2-4 GM/100ML-% IV SOLN
2.0000 g | INTRAVENOUS | Status: AC
  Administered 2023-05-05: 2 g via INTRAVENOUS
  Filled 2023-05-05: qty 100

## 2023-05-05 MED ORDER — OXYCODONE HCL 5 MG PO TABS: 5.0000 mg | ORAL_TABLET | Freq: Once | ORAL | Status: DC

## 2023-05-05 MED ORDER — VASHE WOUND IRRIGATION OPTIME
TOPICAL | Status: DC | PRN
  Administered 2023-05-05: 34 [oz_av]

## 2023-05-05 MED ORDER — ACETAMINOPHEN 500 MG PO TABS
1000.0000 mg | ORAL_TABLET | Freq: Once | ORAL | Status: AC
  Administered 2023-05-05: 1000 mg via ORAL
  Filled 2023-05-05: qty 2

## 2023-05-05 MED ORDER — CHLORHEXIDINE GLUCONATE CLOTH 2 % EX PADS: 6.0000 | MEDICATED_PAD | Freq: Once | CUTANEOUS | Status: DC

## 2023-05-05 MED ORDER — SODIUM CHLORIDE 0.9 % IV SOLN: INTRAVENOUS | Status: DC | PRN

## 2023-05-05 MED ORDER — PHENYLEPHRINE HCL-NACL 20-0.9 MG/250ML-% IV SOLN
INTRAVENOUS | Status: DC | PRN
  Administered 2023-05-05: 40 ug/min via INTRAVENOUS

## 2023-05-05 MED ORDER — OXYCODONE HCL 5 MG/5ML PO SOLN
ORAL | Status: AC
  Administered 2023-05-05: 5 mg
  Filled 2023-05-05: qty 5

## 2023-05-05 MED ORDER — CHLORHEXIDINE GLUCONATE 0.12 % MT SOLN
OROMUCOSAL | Status: AC
  Administered 2023-05-05: 15 mL
  Filled 2023-05-05: qty 15

## 2023-05-05 MED ORDER — FENTANYL CITRATE (PF) 100 MCG/2ML IJ SOLN: 25.0000 ug | INTRAMUSCULAR | Status: DC | PRN

## 2023-05-05 MED ORDER — ACETAMINOPHEN 325 MG PO TABS: 650.0000 mg | ORAL_TABLET | ORAL | Status: DC | PRN

## 2023-05-05 MED ORDER — SODIUM CHLORIDE 0.9 % IV SOLN: 250.0000 mL | INTRAVENOUS | Status: DC | PRN

## 2023-05-05 MED ORDER — SODIUM CHLORIDE 0.9% FLUSH: 3.0000 mL | INTRAVENOUS | Status: DC | PRN

## 2023-05-05 MED ORDER — FENTANYL CITRATE (PF) 100 MCG/2ML IJ SOLN
25.0000 ug | INTRAMUSCULAR | Status: DC | PRN
Start: 2023-05-05 — End: 2023-05-06

## 2023-05-05 MED ORDER — OXYCODONE HCL 5 MG PO TABS: 5.0000 mg | ORAL_TABLET | ORAL | Status: DC | PRN

## 2023-05-05 MED ORDER — PHENYLEPHRINE 80 MCG/ML (10ML) SYRINGE FOR IV PUSH (FOR BLOOD PRESSURE SUPPORT)
PREFILLED_SYRINGE | INTRAVENOUS | Status: DC | PRN
  Administered 2023-05-05 (×2): 160 ug via INTRAVENOUS
  Administered 2023-05-05: 80 ug via INTRAVENOUS

## 2023-05-05 MED ORDER — ACETAMINOPHEN 650 MG RE SUPP
650.0000 mg | RECTAL | Status: DC | PRN
Start: 2023-05-05 — End: 2023-05-06

## 2023-05-05 MED ORDER — ONDANSETRON HCL 4 MG/2ML IJ SOLN: 4.0000 mg | Freq: Once | INTRAMUSCULAR | Status: DC | PRN

## 2023-05-05 MED ORDER — FENTANYL CITRATE (PF) 100 MCG/2ML IJ SOLN: 25.0000 ug | Freq: Once | INTRAMUSCULAR | Status: AC

## 2023-05-05 SURGICAL SUPPLY — 36 items
BAG COUNTER SPONGE SURGICOUNT (BAG) ×1 IMPLANT
BNDG ELASTIC 4INX 5YD STR LF (GAUZE/BANDAGES/DRESSINGS) IMPLANT
BNDG GAUZE DERMACEA FLUFF 4 (GAUZE/BANDAGES/DRESSINGS) IMPLANT
CANISTER WOUND CARE 500ML ATS (WOUND CARE) ×1 IMPLANT
COVER SURGICAL LIGHT HANDLE (MISCELLANEOUS) ×1 IMPLANT
DRAPE INCISE IOBAN 66X45 STRL (DRAPES) IMPLANT
DRSG ADAPTIC 3X8 NADH LF (GAUZE/BANDAGES/DRESSINGS) IMPLANT
DRSG CUTIMED SORBACT 7X9 (GAUZE/BANDAGES/DRESSINGS) ×1 IMPLANT
DRSG VAC GRANUFOAM LG (GAUZE/BANDAGES/DRESSINGS) IMPLANT
DRSG VAC GRANUFOAM MED (GAUZE/BANDAGES/DRESSINGS) IMPLANT
DRSG VAC GRANUFOAM SM (GAUZE/BANDAGES/DRESSINGS) IMPLANT
ELECT CAUTERY BLADE 6.4 (BLADE) ×1 IMPLANT
ELECT REM PT RETURN 9FT ADLT (ELECTROSURGICAL) IMPLANT
ELECTRODE REM PT RTRN 9FT ADLT (ELECTROSURGICAL) IMPLANT
GAUZE PAD ABD 8X10 STRL (GAUZE/BANDAGES/DRESSINGS) IMPLANT
GAUZE SPONGE 4X4 12PLY STRL (GAUZE/BANDAGES/DRESSINGS) ×1 IMPLANT
GEL ULTRASOUND 20GR AQUASONIC (MISCELLANEOUS) IMPLANT
GLOVE BIO SURGEON STRL SZ 6.5 (GLOVE) ×2 IMPLANT
GOWN STRL REUS W/ TWL LRG LVL3 (GOWN DISPOSABLE) ×3 IMPLANT
GRAFT MYRIAD 3 LAYER 10X10 (Graft) IMPLANT
KIT BASIN OR (CUSTOM PROCEDURE TRAY) ×1 IMPLANT
PACK GENERAL/GYN (CUSTOM PROCEDURE TRAY) ×1 IMPLANT
POWDER MYRIAD MORCLLS FINE 500 (Miscellaneous) IMPLANT
PWDR MYRIAD MORCELLS FINE 500 (Miscellaneous) ×1 IMPLANT
STAPLER VISISTAT 35W (STAPLE) IMPLANT
STOCKINETTE IMPERVIOUS 9X36 MD (GAUZE/BANDAGES/DRESSINGS) IMPLANT
STOCKINETTE IMPERVIOUS LG (DRAPES) IMPLANT
SURGILUBE 2OZ TUBE FLIPTOP (MISCELLANEOUS) ×1 IMPLANT
SUT SILK 4 0 P 3 (SUTURE) IMPLANT
SUT VIC AB 5-0 PS2 18 (SUTURE) ×1 IMPLANT
SUT VIC AB 5-0 TF 27 (SUTURE) IMPLANT
SUT VICRYL 5 0 PS 3 18 (SUTURE) IMPLANT
TOWEL GREEN STERILE (TOWEL DISPOSABLE) ×1 IMPLANT
TOWEL GREEN STERILE FF (TOWEL DISPOSABLE) IMPLANT
TUBE CONNECTING 12X1/4 (SUCTIONS) ×1 IMPLANT
YANKAUER SUCT BULB TIP NO VENT (SUCTIONS) ×1 IMPLANT

## 2023-05-05 NOTE — Anesthesia Preprocedure Evaluation (Addendum)
Anesthesia Evaluation  Patient identified by MRN, date of birth, ID band Patient awake    Reviewed: Allergy & Precautions, H&P , NPO status , Patient's Chart, lab work & pertinent test results  History of Anesthesia Complications (+) history of anesthetic complications  Airway Mallampati: II       Dental  (+) Dental Advisory Given, Poor Dentition   Pulmonary neg pulmonary ROS   breath sounds clear to auscultation       Cardiovascular Exercise Tolerance: Good negative cardio ROS Normal cardiovascular exam Rhythm:Regular Rate:Normal  Stent left femoral/popliteal artery 04/15/23   Neuro/Psych  PSYCHIATRIC DISORDERS  Depression   Dementia HOH negative neurological ROS  negative psych ROS   GI/Hepatic negative GI ROS, Neg liver ROS,,,  Endo/Other  negative endocrine ROSHypothyroidism  Blood Glucose 394 this am recheck in preop 80mg %  Renal/GU Renal Insufficiency and CRFRenal diseasenegative Renal ROSR 1.35  negative genitourinary   Musculoskeletal negative musculoskeletal ROS (+)  Open wound left leg with cellulitis   Abdominal   Peds  Hematology negative hematology ROS (+) Blood dyscrasia, anemia Hb 9.2, plt 666 polycythemia vera, chronic myelofibrosis on oral chemotherap   Anesthesia Other Findings   Reproductive/Obstetrics negative OB ROS                             Anesthesia Physical Anesthesia Plan  ASA: 3  Anesthesia Plan: General   Post-op Pain Management: Tylenol PO (pre-op)*   Induction: Intravenous  PONV Risk Score and Plan: 3 and Ondansetron, Treatment may vary due to age or medical condition and TIVA  Airway Management Planned: LMA  Additional Equipment: None  Intra-op Plan:   Post-operative Plan: Extubation in OR  Informed Consent: I have reviewed the patients History and Physical, chart, labs and discussed the procedure including the risks, benefits and  alternatives for the proposed anesthesia with the patient or authorized representative who has indicated his/her understanding and acceptance.   Patient has DNR.  Discussed DNR with power of attorney and Continue DNR.   Dental advisory given and Consent reviewed with POA  Plan Discussed with: Anesthesiologist and CRNA  Anesthesia Plan Comments: (Continue modified DNR no chest compressions)        Anesthesia Quick Evaluation

## 2023-05-05 NOTE — Op Note (Signed)
DATE OF OPERATION: 05/05/2023  LOCATION: [facility] Main Operating Room Outpatient  PREOPERATIVE DIAGNOSIS: left leg wound 10 x 10 cm  POSTOPERATIVE DIAGNOSIS: Same  PROCEDURE: Excision of 10 x 10 cm wound (skin) with placement of 10 x 10 cm Myriad sheet and 500 mg powder  SURGEON: Foster Simpson, DO  ASSISTANT: Keenan Bachelor, PA  EBL: none  CONDITION: Stable  COMPLICATIONS: None  INDICATION: The patient, Laura Wilcox, is a 89 y.o. female born on 08-10-1926, is here for treatment of a wound to the left leg.   PROCEDURE DETAILS:  The patient was seen prior to surgery and marked.  The IV antibiotics were given. The patient was taken to the operating room and given a general anesthetic. A standard time out was performed and all information was confirmed by those in the room. The left leg was prepped and draped.  The leg wound is 10 x 10 cm and is showing some improvement.  The #10 blade was used to excise the nonviable skin of the 10 x 10 cm wound.   The leg was irrigated with vashe. All of the myriad powder and sheet was applied and secured with the 5-0 Vicryl.  The sorbact was placed and secured with the vicryl.  The leg was dressed with the gauze, ky gel, kerlex and ace wrap.  The patient was allowed to wake up and taken to recovery room in stable condition at the end of the case. The family was notified at the end of the case.   The advanced practice practitioner (APP) assisted throughout the case.  The APP was essential in retraction and counter traction when needed to make the case progress smoothly.  This retraction and assistance made it possible to see the tissue plans for the procedure.  The assistance was needed for blood control, tissue re-approximation and assisted with closure of the incision site.

## 2023-05-05 NOTE — Interval H&P Note (Signed)
History and Physical Interval Note:  05/05/2023 4:32 PM  Laura Wilcox  has presented today for surgery, with the diagnosis of wound.  The various methods of treatment have been discussed with the patient and family. After consideration of risks, benefits and other options for treatment, the patient has consented to  Procedure(s): WOUND VAC CHANGE APPLICATION OF SKIN SUBSTITUTE (N/A) as a surgical intervention.  The patient's history has been reviewed, patient examined, no change in status, stable for surgery.  I have reviewed the patient's chart and labs.  Questions were answered to the patient's satisfaction.     [name] Derrion Tritz

## 2023-05-05 NOTE — H&P (Signed)
Laura Wilcox is an 89 y.o. female.   Chief Complaint: left leg wound HPI: The patient is a 89 yrs old female here for further treatment of her left leg. She sustained a wound to the left leg and was taken to [place]. She was transferred to [facility] and we were consulted.  She underwent excision and myriad placed on 1/20.  A Vac was placed.  It was decided that it would likely be too painful for her to have the VAC changed awake. We did a VAC change in the OR on 1/27.  She presents today for another change and possible myriad placement.  Past Medical History:  Diagnosis Date   Acute metabolic encephalopathy 10/22/2020   Complication of anesthesia    pt gets confused a day after receiving anesthesia per the daughter   Confusion 12/30/2018   Polycythemia    SCC (squamous cell carcinoma) 04/11/2021   R med dorsum hand - ED&C SCCIS   SCC (squamous cell carcinoma) 10/09/2021   left lateral eye, EDC   SCC (squamous cell carcinoma) 01/28/2022   left lateral eye, recurrent, EDC. 5FU/Calcipotriene cream.   SCC (squamous cell carcinoma) 01/28/2022   in situ, left medial cheek, 5FU/Calcipotriene Cr   SCC (squamous cell carcinoma) 02/26/2022   SCC IS, Right Ulnar Dorsal Hand, 5FU/Calcipotriene cream   SCC (squamous cell carcinoma) 09/03/2022   R ankle, EDC   SCC (squamous cell carcinoma) 01/20/2023   SCC IS Left medial lower leg, EDC   Squamous cell carcinoma of skin 09/06/2020   R thumb - ED&C   Urinary tract infection without hematuria 01/02/2020   Urticaria 06/02/2017    Past Surgical History:  Procedure Laterality Date   ABDOMINAL AORTOGRAM W/LOWER EXTREMITY N/A 04/15/2023   Procedure: ABDOMINAL AORTOGRAM W/LOWER EXTREMITY;  Surgeon: Nada Libman, MD;  Location: [facility] INVASIVE CV LAB;  Service: Cardiovascular;  Laterality: N/A;   ABDOMINAL HYSTERECTOMY     complete   APPLICATION OF WOUND VAC Left 04/21/2023   Procedure: APPLICATION OF WOUND VAC;  Surgeon: Peggye Form, DO;   Location: [facility] OR;  Service: Plastics;  Laterality: Left;   APPLICATION OF WOUND VAC Left 04/28/2023   Procedure: WOUND VAC CHANGE;  Surgeon: Peggye Form, DO;  Location: [facility] OR;  Service: Plastics;  Laterality: Left;   BREAST SURGERY     HEMATOMA EVACUATION Left 04/02/2023   Procedure: EVACUATION ANKLE HEMATOMA;  Surgeon: Nada Libman, MD;  Location: [facility] ORS;  Service: Vascular;  Laterality: Left;   INCISION AND DRAINAGE OF WOUND Left 04/21/2023   Procedure: IRRIGATION AND DEBRIDEMENT WOUND;  Surgeon: Peggye Form, DO;  Location: [facility] OR;  Service: Plastics;  Laterality: Left;   PERIPHERAL VASCULAR BALLOON ANGIOPLASTY  04/15/2023   Procedure: PERIPHERAL VASCULAR BALLOON ANGIOPLASTY;  Surgeon: Nada Libman, MD;  Location: [facility] INVASIVE CV LAB;  Service: Cardiovascular;;   PERIPHERAL VASCULAR INTERVENTION  04/15/2023   Procedure: PERIPHERAL VASCULAR INTERVENTION;  Surgeon: Nada Libman, MD;  Location: [facility] INVASIVE CV LAB;  Service: Cardiovascular;;    Family History  Problem Relation Age of Onset   Heart disease Father    Cancer Sister        breast   Cancer Sister        breast   Alzheimer's disease Sister    Social History:  reports that she has never smoked. She has never used smokeless tobacco. She reports that she does not drink alcohol and does not use drugs.  Allergies:  Allergies  Allergen  Reactions   Bactrim [Sulfamethoxazole-Trimethoprim]     Due to kidney disease    Epinephrine Other (See Comments)   Other     Per daughter RUE BP significantly higher than LUE    Medications Prior to Admission  Medication Sig Dispense Refill   ammonium lactate (AMLACTIN) 12 % cream Apply topically qd/bid to dry areas at lower legs and arms (Patient taking differently: Apply 1 Application topically 2 (two) times daily. Apply topically qd/bid to dry areas at lower legs and arms) 385 g 2   aspirin EC 81 MG tablet Take 81 mg by mouth daily. Swallow whole.     calcitRIOL  (ROCALTROL) 0.25 MCG capsule Take 0.25 mcg by mouth every Monday, Wednesday, and Friday.     Cholecalciferol (VITAMIN D3) 25 MCG (1000 UT) CAPS Take 1,000 Units by mouth daily.     citalopram (CELEXA) 10 MG tablet Take 10 mg by mouth daily.     cyanocobalamin (VITAMIN B12) 1000 MCG tablet Take 1 tablet (1,000 mcg total) by mouth daily. 30 tablet 1   Iron-Vitamin C 65-125 MG TABS Take 1 tablet by mouth daily. 60 tablet 0   levothyroxine (SYNTHROID) 25 MCG tablet Take 25 mcg by mouth daily.     Multiple Vitamin (MULTIVITAMIN WITH MINERALS) TABS tablet Take 1 tablet by mouth daily.     mupirocin ointment (BACTROBAN) 2 % Apply topically to wound at right jawline qd and cover with bandage until healed. Can use at any other crusted areas on legs and open wounds. (Patient taking differently: Apply 1 Application topically daily. Apply topically to wound at right jawline qd and cover with bandage until healed. Can use at any other crusted areas on legs and open wounds.) 30 g 3   ruxolitinib phosphate (JAKAFI) 5 MG tablet Take 1 tablet (5 mg total) by mouth See admin instructions. Jakafi 10 mg in the morning, 5 mg in the evening. (Patient taking differently: Take 5-10 mg by mouth See admin instructions. Take 10 mg by mouth in the morning and 5 mg in the evening) 90 tablet 1   acetaminophen (TYLENOL) 325 MG tablet Take 325-650 mg by mouth every 6 (six) hours as needed for mild pain or moderate pain.     hydrocortisone 2.5 % cream Apply topically twice daily to aa of right cheek as needed for itch (Patient taking differently: Apply 1 Application topically every 12 (twelve) hours as needed (itching of right cheek).) 30 g 3   ipratropium-albuterol (DUONEB) 0.5-2.5 (3) MG/3ML SOLN Take 3 mLs by nebulization every 4 (four) hours as needed. (Patient taking differently: Take 3 mLs by nebulization every 4 (four) hours as needed (wheezing/SOB).) 360 mL 0   oxyCODONE (OXY IR/ROXICODONE) 5 MG immediate release tablet Take 1  tablet (5 mg total) by mouth every 6 (six) hours as needed for severe pain (pain score 7-10). (Patient not taking: Reported on 05/01/2023) 30 tablet 0   polyethylene glycol (MIRALAX / GLYCOLAX) 17 g packet Take 17 g by mouth daily as needed for mild constipation.      No results found. However, due to the size of the patient record, not all encounters were searched. Please check Results Review for a complete set of results. No results found.  Review of Systems  Constitutional:  Positive for activity change and appetite change.  Eyes: Negative.   Respiratory: Negative.    Cardiovascular:  Positive for leg swelling.  Gastrointestinal: Negative.   Endocrine: Negative.   Genitourinary: Negative.   Musculoskeletal:  Positive for myalgias.    Blood pressure 133/70, pulse 82, temperature 98 F (36.7 C), resp. rate 16, height 5' (1.524 m), weight 49.9 kg, SpO2 90%. Physical Exam HENT:     Head: Atraumatic.     Nose: Nose normal.  Cardiovascular:     Rate and Rhythm: Normal rate.     Pulses: Normal pulses.  Abdominal:     Palpations: Abdomen is soft.  Musculoskeletal:        General: Swelling and tenderness present.  Skin:    Capillary Refill: Capillary refill takes less than 2 seconds.     Findings: Bruising and lesion present.      Assessment/Plan Left leg wound - plan for excision, possible vac change and myriad placement.  [name] Henessy Rohrer, DO 05/05/2023, 4:32 PM

## 2023-05-05 NOTE — Discharge Instructions (Addendum)
Wound Care   Guide to Wound Care  Proper wound care may reduce the risk of infection, improve healing rates, and limit scarring.  This is a general guide to help care for and manage wounds treated with product wound matrix.   Dressing Changes The frequency of dressing changes can vary based on which product was applied, the size of the wound, or the amount of wound drainage. Dressing inspections are recommended, at least weekly.   Place KY gel on the wound daily and cover with gauze.  Dressing Types Primary Dressing:  Non-adherent dressing goes directly over wounds being treated with the powder or sheet.  Secondary Dressing:  Secures the primary dressing in place and provides extra protection, compression, and absorption.  1. Wash Hands - To help decrease the risk of infection, caregivers should wash their hands for a minimum of 20 seconds and may use medical gloves.   2. Remove the Dressings - Avoid removing product from the wound by carefully removing the applicable dressing(s) at the time points recommended above, or as recommended by the treating physician.  Expected Color and Odor:  It is entirely normal for the wound to have an unpleasant odor and to form a caramel-colored gel as the product absorbs into the wound. It is important to leave this gel on the wound site.  3. Clean the Wound - Use clean water or saline to gently rinse around the wound surface and remove any excess discharge that may be present on the wound. Do not wipe off any of the caramel-colored gel on the wound.   What to look out for:  Large or increased amount of drainage   Surrounding skin has worsening redness or hot to touch   Increased pain in or around the wound   Flu-like symptoms, fatigue, decreased appetite, fever   Hard, crusty wound surface with black or brown coloring  4. Apply New Dressings - Dressings should cover the entire wound and be suitable for maintaining a moist wound environment.  The  non-adherent mesh dressing should be left in place.  New dressing should consist of KY Jelly to keep the wound moist and soft gauze secured with a wrap or tape.   Maintain a Hydrated Wound Area It is important to keep the wound area moist throughout the healing process. If the wound appears to be dry during dressing changes, select a dressing that will hydrate the wound and maintain that ideal moist environment. If you are unsure what to do, ask the treating physician.  Remodeling Process Every patient heals differently, and no two cases are the same. The size and location of the wound, product type and layering configurations, and general patient health all contribute to how quickly a wound will heal.  While many factors can influence the rate at which the product absorbs, the following can be used as a general guide.   THINGS TO DO: Refrain from smoking High protein diet with plenty of vegetables and some fruit  Limit simple processed carbohydrates and sugar Protect the wound from trauma Protect the dressing  powder       Sheet            Sorbact dressing

## 2023-05-05 NOTE — Anesthesia Procedure Notes (Signed)
Procedure Name: LMA Insertion Date/Time: 05/05/2023 5:09 PM  Performed by: Venia Carbon, CRNAPre-anesthesia Checklist: Patient identified, Emergency Drugs available, Suction available, Patient being monitored and Timeout performed Patient Re-evaluated:Patient Re-evaluated prior to induction Oxygen Delivery Method: Circle system utilized Preoxygenation: Pre-oxygenation with 100% oxygen Induction Type: IV induction LMA: LMA inserted LMA Size: 4.0 Number of attempts: 1 Airway Equipment and Method: Patient positioned with wedge pillow Placement Confirmation: positive ETCO2, CO2 detector and breath sounds checked- equal and bilateral Tube secured with: Tape

## 2023-05-05 NOTE — Transfer of Care (Signed)
Immediate Anesthesia Transfer of Care Note  Patient: Laura Wilcox  Procedure(s) Performed: WOUND VAC CHANGE APPLICATION OF SKIN SUBSTITUTE  Patient Location: PACU  Anesthesia Type:General  Level of Consciousness: awake, alert , and responds to stimulation  Airway & Oxygen Therapy: Patient Spontanous Breathing and Patient connected to face mask oxygen  Post-op Assessment: Report given to RN, Post -op Vital signs reviewed and stable, Patient moving all extremities, Patient moving all extremities X 4, and Patient able to stick tongue midline  Post vital signs: Reviewed and stable  Last Vitals:  Vitals Value Taken Time  BP 118/89 05/05/23 1730  Temp    Pulse 75 05/05/23 1732  Resp 25 05/05/23 1733  SpO2 98 % 05/05/23 1732  Vitals shown include unfiled device data.  Last Pain:  Vitals:   05/05/23 1316  PainSc: 0-No pain         Complications: No notable events documented.

## 2023-05-06 ENCOUNTER — Encounter (HOSPITAL_COMMUNITY): Payer: Self-pay | Admitting: Plastic Surgery

## 2023-05-06 ENCOUNTER — Telehealth: Payer: Self-pay

## 2023-05-06 NOTE — Anesthesia Postprocedure Evaluation (Signed)
Anesthesia Post Note  Patient: Laura Wilcox  Procedure(s) Performed: WOUND VAC CHANGE APPLICATION OF SKIN SUBSTITUTE     Patient location during evaluation: PACU Anesthesia Type: General Level of consciousness: awake and alert Pain management: pain level controlled Vital Signs Assessment: post-procedure vital signs reviewed and stable Respiratory status: spontaneous breathing, nonlabored ventilation and respiratory function stable Cardiovascular status: blood pressure returned to baseline and stable Postop Assessment: no apparent nausea or vomiting Anesthetic complications: no   No notable events documented.  Last Vitals:  Vitals:   05/05/23 1800 05/05/23 1815  BP: 93/82 131/63  Pulse: 79 83  Resp: (!) 22 19  Temp:  36.7 C  SpO2: 92% 92%    Last Pain:  Vitals:   05/05/23 1800  PainSc: 0-No pain                 Collene Schlichter

## 2023-05-06 NOTE — Telephone Encounter (Signed)
 Laymon Sox with [facility] called to verify pt's weight bearing status for therapies.  Reviewed pt's chart, returned call for clarification, two identifiers used. Informed her that there have been no restrictions for weight bearing for over the past month. Confirmed understanding.

## 2023-05-07 ENCOUNTER — Telehealth: Payer: Self-pay

## 2023-05-07 NOTE — Telephone Encounter (Signed)
 Recommend daily changes with the ky jelly, gauze, abd, kerlix, ace wrap Do not remove green sorbact, it is sutured down. Thank you!

## 2023-05-07 NOTE — Telephone Encounter (Addendum)
 Called Kim back, [place] transferred me to her, na, left a detailed voice mail with Matt's instructions. Adv to call the office back with additional questions. Adv that the office is now closed but a provider is on call and can assist her if she called after hours.

## 2023-05-07 NOTE — Telephone Encounter (Signed)
 Kim from [facility] left voicemail asking what the treatment plan was for patient's wound now that the wound vac is off. Per Dr. Ace surgical note, the leg was dressed with the gauze, ky gel, kerlex and ace wrap. Is she to continue this plan?

## 2023-05-08 NOTE — Telephone Encounter (Signed)
 Luke Irving called again today at 12:22 pm requesting the same information she did yesterday. Denise transferred me to Kim's ext, na, I left another detailed voicemail regarding dressing changes and supplies needed. I called the number back and spoke to Henrietta. I adv her to inform Luke to check her voice mail. The information she's requesting has been left twice on there. She conveyed understanding.

## 2023-05-09 ENCOUNTER — Ambulatory Visit (INDEPENDENT_AMBULATORY_CARE_PROVIDER_SITE_OTHER): Payer: Medicare Other | Admitting: Plastic Surgery

## 2023-05-09 DIAGNOSIS — S81802A Unspecified open wound, left lower leg, initial encounter: Secondary | ICD-10-CM

## 2023-05-09 NOTE — Progress Notes (Signed)
 The patient is a 89 year old female here with her mom for evaluation of her left leg.  Actually looks stable for the moment.  I went ahead and took the dressing off but left the sore back in place.  I do not think the dressing has been changed since the surgery.  I have asked for daily dressing changes with KY on top of the Sorbact which is the green dressing and then we will plan to see her back in 1 week.  She was wrapped again with Kerlix and an Ace wrap.

## 2023-05-14 ENCOUNTER — Ambulatory Visit (INDEPENDENT_AMBULATORY_CARE_PROVIDER_SITE_OTHER): Payer: Medicare Other | Admitting: Student

## 2023-05-14 VITALS — BP 117/64 | HR 85

## 2023-05-14 DIAGNOSIS — S81802A Unspecified open wound, left lower leg, initial encounter: Secondary | ICD-10-CM | POA: Diagnosis not present

## 2023-05-14 NOTE — Progress Notes (Signed)
   Referring Provider Smiley Houseman, NP [phone] [place],  [place] [address]   CC:  Chief Complaint  Patient presents with   follow up      Laura Wilcox is an 89 y.o. female.  HPI: Patient is a 89 year old female with history of a wound to her left leg.  She recently underwent excision of the wound with placement of myriad with Dr. Ulice Bold on 05/05/2023.  She presents to the clinic today for further follow-up on her wound.  Patient was most recently seen on 05/09/2023.  At this visit, the wound was found to be stable for the moment.  The Sorbact was left in place.  Plan was for patient to apply K-Y jelly on top of the Sorbact and plan was for patient to follow-up in 1 week.  Today, patient presents with her daughter at bedside.  Patient's daughter states that patient has been staying in the SNF and she is unsure if dressing changes have been performed daily.  She denies any issues with her mother's wound since the previous visit.  Review of Systems General: No changes since previous visit  Physical Exam    05/14/2023    1:38 PM 05/05/2023    6:15 PM 05/05/2023    6:00 PM  Vitals with BMI  Systolic 117 131 93  Diastolic 64 63 82  Pulse 85 83 79    General:  No acute distress,  Alert, Non-Toxic, Normal speech and affect On exam, patient is sitting upright in no acute distress.  Dressings were removed down to the Sorbact.  Sorbact was kept in place.  It appears somewhat dry.  There does appear to be some unincorporated myriad underneath the Sorbact.  No active drainage on exam.  No overt signs of infection.  Assessment/Plan  Wound of left lower extremity, initial encounter   Vashe soaked gauze were applied over the green Sorbact for several minutes.  These were then removed and K-Y jelly, Vashe soaked gauze, ABD pad, Kerlix and Ace wrap were applied over the wound.  Discussed with the patient's daughter the importance of daily dressing changes and keeping the  Sorbact moist.  I wrote orders for the SNF and gave to the patient's daughter to deliver to the SNF.  Patient's daughter expressed understanding.  Discussed with patient's daughter that we will plan to remove the Sorbact next week.  Instructed her to call in the meantime she has any questions or concerns about anything.  Pictures were obtained of the patient and placed in the chart with the patient's or guardian's permission.   Laura Wilcox 05/14/2023, 2:15 PM

## 2023-05-19 ENCOUNTER — Telehealth: Payer: Self-pay

## 2023-05-19 NOTE — Telephone Encounter (Signed)
I called the patient's daughter to check in. She states her mother is doing well and they changed her dressing on Friday afternoon. She states her brother went to see [facility] this weekend and they told him a wound care nurse is now changing her dressing every morning. The son said she seems more like herself and is eating. I told her to call us with any questions or concerns.

## 2023-05-21 ENCOUNTER — Encounter: Payer: Self-pay | Admitting: Student

## 2023-05-21 ENCOUNTER — Ambulatory Visit (INDEPENDENT_AMBULATORY_CARE_PROVIDER_SITE_OTHER): Payer: Medicare Other | Admitting: Student

## 2023-05-21 VITALS — BP 99/69 | HR 76

## 2023-05-21 DIAGNOSIS — S81802A Unspecified open wound, left lower leg, initial encounter: Secondary | ICD-10-CM

## 2023-05-21 NOTE — Progress Notes (Signed)
Referring Provider Smiley Houseman, NP [phone] [place],  [place] [address]   CC:  Chief Complaint  Patient presents with   Post-op Follow-up      Laura Wilcox is an 89 y.o. female.  HPI: Patient is a 89 year old female with history of a wound to her left leg. She recently underwent excision of the wound with placement of myriad with Dr. Ulice Bold on 05/05/2023. She presents to the clinic today for further follow-up on her wound.   Patient was last seen in the clinic on 05/14/2023.  At this visit, patient presented with her daughter.  On exam, dressings were removed down to the Sorbact.  Sorbact was kept in place.  It appeared somewhat dry.  There did appear to be some unincorporated myriad underneath the Sorbact.  Today, patient reports with her son at bedside.  He states that patient recently was found to have a bowel obstruction which has since resolved.  He reports otherwise he believes the patient is doing well.  He denies the facility reporting any fevers or chills.  Review of Systems General: Does not report any fevers or chills  Physical Exam    05/14/2023    1:38 PM 05/05/2023    6:15 PM 05/05/2023    6:00 PM  Vitals with BMI  Systolic 117 131 93  Diastolic 64 63 82  Pulse 85 83 79    General:  No acute distress,  Alert and oriented, Non-Toxic, Normal speech and affect On exam, patient is sitting upright in no acute distress.  Dressings to the left lower extremity were removed as well as the Sorbact.  Wound measured approximately 9 cm x 10 cm.  Upon Sorbact removal, there was desiccated/unincorporated myriad over the wound.  Most of this was removed, removal was limited as patient did not tolerate removal well.  I was unable to remove desiccated myriad from distal part of the wound.  There also was eschar noted to the distal part of the wound/over the foot.  Upon removal of the myriad, the tendon was exposed.  It was slightly darkened.  There was healthy  tissue in the wound as well as some darkened areas that appear to be consistent with some old blood.  There is also some mild surrounding erythema to the wound.  There is no induration noted on exam.  Assessment/Plan  Wound of left lower extremity, initial encounter   Vashe soaked gauze were placed over the wound for several minutes.  Upon removal of the gauze, donated myriad powder and sheet were placed over the wound and the tendon.  Distally over the foot, Vaseline and Xeroform were placed.  ABD was placed and the area was wrapped with Kerlix and Ace wrap.  Discussed with the patient's son that it appeared that the myriad had dried out.  Discussed with him the importance of keeping the myriad moist to help with its incorporation.  I did write down the instructions for the SNF and I did go over the instructions with the patient's son.  He expressed understanding.  Discussed and wrote down for the SNF facility that I would like dressing changes to start this Friday.  I would like the wound to be soaked in Vashe soaked gauze for 10 minutes.  Adaptic can then be applied followed by K-Y jelly.  To the distal aspect of the wound over the foot, patient is to apply Vaseline and Xeroform.  This then may be covered with an ABD pad  followed by Kerlix and Ace wrap.  Dressing changes to occur 1-2 times daily.  Also discussed with the patient's son that the little bit of redness around the patient's wound could be some irritation versus a little bit of cellulitis.  I will go ahead and place the patient on an antibiotic.  I did write this in the orders for Keflex 4 times a day for 5 days.  I discussed with the patient's son that if she has any worsening redness around her foot they should let us know.  Patient and son expressed understanding.  Patient to follow back up on Tuesday.  I did discuss today's visit with Dr. Ulice Bold and she is in agreement with the plan.  I instructed the patient's son to call us  if they have any questions or concerns about anything.  Pictures were obtained of the patient and placed in the chart with the patient's or guardian's permission.    Laura Wilcox 05/21/2023, 9:27 AM

## 2023-05-23 ENCOUNTER — Other Ambulatory Visit: Payer: Self-pay

## 2023-05-23 ENCOUNTER — Telehealth: Payer: Self-pay | Admitting: Plastic Surgery

## 2023-05-23 DIAGNOSIS — I739 Peripheral vascular disease, unspecified: Secondary | ICD-10-CM

## 2023-05-23 NOTE — Telephone Encounter (Signed)
Daughter is calling bc some wound order that went to nursing facility and they need clarification before they can proceed with the patients wound care, they reached out to the daughter to reach out to [place]. Please advise, please call [phone] Nurse Tiffany, needs a dressing changed soon, the pts nurse Elmarie Shiley is there until 5pm today

## 2023-05-23 NOTE — Telephone Encounter (Signed)
Spoke with daughter as well as the wound care nurses at the SNF. They do not have Adaptic, but will instead have to proceed with [place] jelly directly over the still incorporating Myriad followed by Xeroform, ABD pad, and Kerlix changed 1-2x daily. Follow up with [name] early next week. All questions answered.

## 2023-05-27 ENCOUNTER — Ambulatory Visit (INDEPENDENT_AMBULATORY_CARE_PROVIDER_SITE_OTHER): Payer: Medicare Other | Admitting: Student

## 2023-05-27 DIAGNOSIS — S81802A Unspecified open wound, left lower leg, initial encounter: Secondary | ICD-10-CM | POA: Diagnosis not present

## 2023-05-27 NOTE — Progress Notes (Signed)
 Referring Provider Smiley Houseman, NP [phone] [place],  [place] [address]   CC:  Chief Complaint  Patient presents with   Post-op Follow-up      Laura Wilcox is an 89 y.o. female.  HPI: Patient is a 89 year old female with history of a wound to her left leg. She recently underwent excision of the wound with placement of myriad with Dr. Ulice Bold on 05/05/2023. She presents to the clinic today for further follow-up on her wound.   Patient was most recently seen in the clinic on 05/21/2023.  At this visit, dressings and Sorbact were removed to the left lower extremity wound.  Wound measured approximately 9 cm x 10 cm.  Upon removal of the Sorbact, there was desiccated/unincorporated myriad over the wound.  Most of this was removed, but was unable to remove desiccated myriad from the distal part of the wound.  There was also eschar noted to the distal part of the wound over the foot.  The tendon was also noted to be exposed and slightly darkened.  There was also a little bit of erythema surrounding the wound.  Donated myriad powder and sheet were placed over the wound and the tendon.  Distally over the foot, Vaseline and Xeroform were placed.  Patient was started on 5 days of Keflex.  Today, patient presents with her daughter at bedside.  Patient's daughter states that the nursing home has not been doing the dressing changes daily and has not been doing them as ordered.  She also states that patient was found to have a wound to the upper portion of her left leg.  She was unsure of the etiology of the wound, suspects that it might be trauma from her wheelchair.  Review of Systems General: Patient's daughter does not report any infectious symptoms.  Physical Exam    05/21/2023   10:04 AM 05/14/2023    1:38 PM 05/05/2023    6:15 PM  Vitals with BMI  Systolic 99 117 131  Diastolic 69 64 63  Pulse 76 85 83    General:  No acute distress,  Alert and oriented, Non-Toxic,  Normal speech and affect Wound to the lower leg is noted to have some desiccated myriad/eschar over the tendon and to the top of the foot.  There does appear to be some improving granulation tissue to the remainder of the wound.  There is a wound just noted to the lateral leg just below the knee.  It appears to be a skin tear with healthy appearing tissue and throughout the wound.  The lower leg appears to be somewhat purple in color, consistent with some venous insufficiency.  Assessment/Plan  Wound of left lower extremity, initial encounter - Plan: Ambulatory Referral For Surgery Scheduling   Discussed today's visit and had Dr. Ulice Bold evaluate the patient.  She recommends that patient go back to the operating room for debridement of the lower leg wound.  Given the venous insufficiency to the lower leg, we did discuss with the patient's daughter that this will delay wound healing.  We also did discuss the possibility of amputation.  Patient's daughter states that she has an appointment with vascular surgery on Monday.  Recommended that she keep this appointment.  Recommend Adaptic, K-Y jelly, Telfa, Kerlix and Ace wrap daily.  Adaptic may be changed every other day if needed.  Did emphasize the importance of K-Y jelly daily to the wound.  The orders were written down and given to the  daughter to get back to the facility.  Recommend cleaning the more proximal wound with Vashe daily and applying Xeroform and a Mepilex border dressing 1-2 times daily.  Will plan for surgery, if surgery is not next week we will have the patient come in next week and hopefully surgery the following week.  Instructed patient's daughter to call our office if she or the nursing facility have any questions about anything.  Pictures were obtained of the patient and placed in the chart with the patient's or guardian's permission.   [name] 05/27/2023, 4:18 PM

## 2023-05-28 ENCOUNTER — Other Ambulatory Visit: Payer: Self-pay

## 2023-05-28 DIAGNOSIS — D45 Polycythemia vera: Secondary | ICD-10-CM

## 2023-05-28 NOTE — Telephone Encounter (Signed)
 Pt is to hold off medication until she see's Dr. Cathie Hoops. Please schedule lab/MD next week and inform pt's daughter.

## 2023-05-28 NOTE — Telephone Encounter (Signed)
 The daughter called about Laura Wilcox was held while she was in the hospital. And was suppose to start back up when she got out hosptial. Per daughter she has been in and out hospitals for 2-3 months and she needs to know if she should go on the [place] back on on or not

## 2023-05-28 NOTE — Telephone Encounter (Signed)
 Spoke to [place] and she is agreeable to appt on 3/5.

## 2023-05-30 ENCOUNTER — Telehealth: Payer: Self-pay

## 2023-05-30 NOTE — Telephone Encounter (Signed)
 I called the patient's facility and spoke with nursing supervisor Zachery Dauer in regards to dressing changes.  Discussed with her that I recommend Xeroform daily dressing changes to the wound just inferior/lateral to the knee.  Also discussed with her that about half a tube of K-Y jelly or surgery lube may be used over the Adaptic to the lower leg wound.  Discussed with her that the Adaptic can be changed every other day, but all of the other dressings must be changed daily.  Also discussed with her the K-Y jelly must be applied to the Adaptic daily.  She expressed understanding.  I wrote orders down again and will have our office send those over to the facility.  The wound care orders should be as follows: To the wound inferior/lateral to the left knee: Wound may be cleaned with saline.  Pat dry.  Apply Xeroform and then a Mepilex border dressing.  Change dressings daily.  To the lower leg wound: Apply Adaptic, approximately one half tube of K-Y jelly, nonadherent gauze and ABD pad to the wound.  Wrap the leg with Kerlix and Ace wrap.  The Adaptic may be changed every other day.  All other dressings should be changed daily.  K-Y jelly should be applied daily to the Adaptic.  I also called the patient's daughter and discussed with her that I did speak with the nursing supervisor.  Also discussed with her that I would send over updated orders.  We will plan to see the patient next week, and patient is to go to surgery the following week.  I instructed the patient's daughter to call in the meantime if she has any questions or concerns about anything.

## 2023-05-30 NOTE — Telephone Encounter (Signed)
 Faxed wound care instruction sheet to [facility].  We have sent wound care instructions after each of the patient's last 3 visits. However, daughter states they are still not changing her dressings as directed. PA spoke with [facility] today and reiterated wound care, as well as re-writing them on another wound care instructions sheet.  I faxed this sheet to: [phone] attn: Nurse Manager with confirmed receipt.  The phone number to [facility] is: [phone]. I will scan instructions to patient's chart along with the confirmation page that the fax went through, in case there is any further confusion from the nursing/wound care staff at SNF as to what is needed for patient's care.

## 2023-06-02 ENCOUNTER — Ambulatory Visit (HOSPITAL_COMMUNITY): Payer: No Typology Code available for payment source

## 2023-06-02 ENCOUNTER — Encounter: Payer: Self-pay | Admitting: Hematology and Oncology

## 2023-06-02 ENCOUNTER — Encounter: Payer: PRIVATE HEALTH INSURANCE | Admitting: Surgery

## 2023-06-03 ENCOUNTER — Encounter: Payer: PRIVATE HEALTH INSURANCE | Admitting: Student

## 2023-06-03 ENCOUNTER — Ambulatory Visit (HOSPITAL_BASED_OUTPATIENT_CLINIC_OR_DEPARTMENT_OTHER)
Admission: RE | Admit: 2023-06-03 | Discharge: 2023-06-03 | Disposition: A | Discharge status: Home / Self Care | Source: Ambulatory Visit | Attending: Surgery | Admitting: Surgery

## 2023-06-03 ENCOUNTER — Ambulatory Visit (HOSPITAL_COMMUNITY)
Admission: RE | Admit: 2023-06-03 | Discharge: 2023-06-03 | Disposition: A | Discharge status: Home / Self Care | Source: Ambulatory Visit | Attending: Surgery | Admitting: Surgery

## 2023-06-03 DIAGNOSIS — I739 Peripheral vascular disease, unspecified: Secondary | ICD-10-CM | POA: Diagnosis present

## 2023-06-04 ENCOUNTER — Inpatient Hospital Stay: Payer: No Typology Code available for payment source

## 2023-06-04 ENCOUNTER — Ambulatory Visit: Admitting: Student

## 2023-06-04 ENCOUNTER — Inpatient Hospital Stay: Payer: No Typology Code available for payment source | Admitting: Oncology

## 2023-06-04 DIAGNOSIS — S81802D Unspecified open wound, left lower leg, subsequent encounter: Secondary | ICD-10-CM

## 2023-06-04 LAB — VAS US ABI WITH/WO TBI
Left ABI: 0.62
Right ABI: 0.68

## 2023-06-04 NOTE — Progress Notes (Signed)
 Referring Provider Smiley Houseman, NP [phone] [address] [place],  [place] [address]   CC:  Chief Complaint  Patient presents with   Post-op Follow-up      Laura Wilcox is an 89 y.o. female.  HPI: Patient is a 89 year old female with history of a wound to her left leg. She recently underwent excision of the wound with placement of myriad with Dr. Ulice Bold on 05/05/2023. She presents to the clinic today for further follow-up on her wound.   Patient was most recently seen in the clinic on 05/27/2023.  At this visit, wound to the lower leg was noted to have some desiccated myriad/eschar over the tendon and the top of the foot.  There was some improving granulation tissue to the remainder of the wound.  There was a wound also noted to the lateral leg just below the knee.  It appeared to be skin tear with healthy appearing tissue throughout the wound.  Recommended Adaptic, K-Y jelly, Telfa, Kerlix and Ace wrap daily to the lower leg wound.  It was recommended to clean the more proximal wound with Vashe daily and apply Xeroform and a Mepilex border dressing 1-2 times daily.  Today, patient presents with her daughter at bedside.  Patient's daughter states that patient has been doing well.  She does report the proximal wound does appear to be a little bit bigger.  She denies any other issues or concerns at this time.  She does report that patient had vascular studies done yesterday and reports that her ABIs were ~0.68 bilaterally.  She states that she has not heard yet from vascular in regards to this.  She also reports that she is planning to take her mother to her hematologist tomorrow as patient has history of polycythemia.  Review of Systems General: No new changes in health  Physical Exam    05/21/2023   10:04 AM 05/14/2023    1:38 PM 05/05/2023    6:15 PM  Vitals with BMI  Systolic 99 117 131  Diastolic 69 64 63  Pulse 76 85 83    General:  No acute distress,  Alert and  oriented, Non-Toxic, Normal speech and affect On exam, patient is sitting in her wheelchair in no acute distress.  To the proximal leg, there is an approximately 4 cm x 4 cm wound.  It does appear to be slightly deeper in the middle.  There is some exudate noted centrally in the wound, otherwise there appears to be healthy appearing tissue.  There also appears to be a small skin tear noted just medially of this wound.  To the lower leg, eschar present noted to the inferior aspect of the wound.  It appears some of the eschar has sloughed off from the top over the tendon but there is still exudate noted over the tendon.  It also does appear that there is a little bit of tended noted laterally that is exposed.  Otherwise wound does appear to have healthy appearing granulation tissue.  There are no signs of infection on exam today.  Assessment/Plan  Wound of left lower extremity, subsequent encounter   Discussed with patient's daughter that wound care may continue to the lower leg wound as prescribed.  I did bring to her attention the exposed tendon noted to the lateral aspect of the wound.  Recommend to continue to clean the wound with Vashe soaked gauze, apply Adaptic, K-Y jelly, Telfa, ABD pad, Kerlix and Ace wrap.  To the proximal  wound, donated myriad was placed throughout the wound.  Recommend keeping this in place until Saturday.  On Saturday, dressing may be removed and either Adaptic, K-Y jelly, ABD pad, Kerlix and Ace wrap can be placed or Xeroform, ABD pad, Ace wrap and Kerlix may be placed.  Also recommended that she apply Vaseline and a nonstick gauze over the small skin tear noted medially to this wound.    Wound care instructions were written down and given to the daughter to take back to the patient's SNF.  Discussed with patient's daughter to follow-up with vascular surgery in regards to patient's results.  She states that she does have an appointment with them tomorrow.  Recommended she  keep her appointment with hematology as well.  Encouraged her to reach out to [place] if vascular or hematology have any recommendations for upcoming surgery.  Patient's daughter expressed understanding.  Plan is for OR on Monday.  I instructed the patient's daughter to reach out if she has any questions or concerns about anything.  Pictures were obtained of the patient and placed in the chart with the patient's or guardian's permission.   [name] 06/04/2023, 3:07 PM

## 2023-06-05 ENCOUNTER — Telehealth: Payer: Self-pay

## 2023-06-05 ENCOUNTER — Inpatient Hospital Stay: Attending: Oncology

## 2023-06-05 ENCOUNTER — Ambulatory Visit: Admitting: Surgery

## 2023-06-05 ENCOUNTER — Inpatient Hospital Stay (HOSPITAL_BASED_OUTPATIENT_CLINIC_OR_DEPARTMENT_OTHER): Admitting: Oncology

## 2023-06-05 ENCOUNTER — Encounter: Payer: Self-pay | Admitting: Oncology

## 2023-06-05 VITALS — BP 118/65 | HR 85 | Temp 98.6°F | Resp 17

## 2023-06-05 DIAGNOSIS — Z818 Family history of other mental and behavioral disorders: Secondary | ICD-10-CM | POA: Diagnosis not present

## 2023-06-05 DIAGNOSIS — Z85828 Personal history of other malignant neoplasm of skin: Secondary | ICD-10-CM | POA: Insufficient documentation

## 2023-06-05 DIAGNOSIS — D709 Neutropenia, unspecified: Secondary | ICD-10-CM | POA: Insufficient documentation

## 2023-06-05 DIAGNOSIS — D471 Chronic myeloproliferative disease: Secondary | ICD-10-CM | POA: Diagnosis present

## 2023-06-05 DIAGNOSIS — Z87440 Personal history of urinary (tract) infections: Secondary | ICD-10-CM | POA: Diagnosis not present

## 2023-06-05 DIAGNOSIS — Z8249 Family history of ischemic heart disease and other diseases of the circulatory system: Secondary | ICD-10-CM | POA: Insufficient documentation

## 2023-06-05 DIAGNOSIS — D7581 Myelofibrosis: Secondary | ICD-10-CM

## 2023-06-05 DIAGNOSIS — Z881 Allergy status to other antibiotic agents status: Secondary | ICD-10-CM | POA: Diagnosis not present

## 2023-06-05 DIAGNOSIS — N1831 Chronic kidney disease, stage 3a: Secondary | ICD-10-CM | POA: Diagnosis not present

## 2023-06-05 DIAGNOSIS — Z66 Do not resuscitate: Secondary | ICD-10-CM | POA: Diagnosis not present

## 2023-06-05 DIAGNOSIS — Z90710 Acquired absence of both cervix and uterus: Secondary | ICD-10-CM | POA: Diagnosis not present

## 2023-06-05 DIAGNOSIS — F0390 Unspecified dementia without behavioral disturbance: Secondary | ICD-10-CM | POA: Insufficient documentation

## 2023-06-05 DIAGNOSIS — Z87891 Personal history of nicotine dependence: Secondary | ICD-10-CM | POA: Insufficient documentation

## 2023-06-05 DIAGNOSIS — Z803 Family history of malignant neoplasm of breast: Secondary | ICD-10-CM | POA: Insufficient documentation

## 2023-06-05 DIAGNOSIS — D631 Anemia in chronic kidney disease: Secondary | ICD-10-CM

## 2023-06-05 DIAGNOSIS — N1832 Chronic kidney disease, stage 3b: Secondary | ICD-10-CM

## 2023-06-05 DIAGNOSIS — F0392 Unspecified dementia, unspecified severity, with psychotic disturbance: Secondary | ICD-10-CM | POA: Insufficient documentation

## 2023-06-05 DIAGNOSIS — D45 Polycythemia vera: Secondary | ICD-10-CM | POA: Diagnosis present

## 2023-06-05 DIAGNOSIS — Z79899 Other long term (current) drug therapy: Secondary | ICD-10-CM | POA: Insufficient documentation

## 2023-06-05 LAB — CMP (CANCER CENTER ONLY)
ALT: 12 U/L (ref 0–44)
AST: 15 U/L (ref 15–41)
Albumin: 2.7 g/dL — ABNORMAL LOW (ref 3.5–5.0)
Alkaline Phosphatase: 99 U/L (ref 38–126)
Anion gap: 10 (ref 5–15)
BUN: 37 mg/dL — ABNORMAL HIGH (ref 8–23)
CO2: 26 mmol/L (ref 22–32)
Calcium: 9.2 mg/dL (ref 8.9–10.3)
Chloride: 101 mmol/L (ref 98–111)
Creatinine: 0.93 mg/dL (ref 0.44–1.00)
GFR, Estimated: 57 mL/min — ABNORMAL LOW (ref >60)
Glucose, Bld: 129 mg/dL — ABNORMAL HIGH (ref 70–99)
Potassium: 4.5 mmol/L (ref 3.5–5.1)
Sodium: 137 mmol/L (ref 135–145)
Total Bilirubin: 0.6 mg/dL (ref 0.0–1.2)
Total Protein: 6.6 g/dL (ref 6.5–8.1)

## 2023-06-05 LAB — IRON AND TIBC
Iron: 24 ug/dL — ABNORMAL LOW (ref 28–170)
Saturation Ratios: 13 % (ref 10.4–31.8)
TIBC: 181 ug/dL — ABNORMAL LOW (ref 250–450)
UIBC: 157 ug/dL

## 2023-06-05 LAB — RETIC PANEL
Immature Retic Fract: 31.3 % — ABNORMAL HIGH (ref 2.3–15.9)
RBC.: 3.73 MIL/uL — ABNORMAL LOW (ref 3.87–5.11)
Retic Count, Absolute: 99.2 10*3/uL (ref 19.0–186.0)
Retic Ct Pct: 2.7 % (ref 0.4–3.1)
Reticulocyte Hemoglobin: 21.5 pg — ABNORMAL LOW (ref >27.9)

## 2023-06-05 LAB — CBC WITH DIFFERENTIAL (CANCER CENTER ONLY)
Abs Immature Granulocytes: 1.65 10*3/uL — ABNORMAL HIGH (ref 0.00–0.07)
Basophils Absolute: 0.3 10*3/uL — ABNORMAL HIGH (ref 0.0–0.1)
Basophils Relative: 2 %
Eosinophils Absolute: 0.9 10*3/uL — ABNORMAL HIGH (ref 0.0–0.5)
Eosinophils Relative: 5 %
HCT: 32.2 % — ABNORMAL LOW (ref 36.0–46.0)
Hemoglobin: 9.7 g/dL — ABNORMAL LOW (ref 12.0–15.0)
Immature Granulocytes: 10 %
Lymphocytes Relative: 9 %
Lymphs Abs: 1.6 10*3/uL (ref 0.7–4.0)
MCH: 26.3 pg (ref 26.0–34.0)
MCHC: 30.1 g/dL (ref 30.0–36.0)
MCV: 87.3 fL (ref 80.0–100.0)
Monocytes Absolute: 1.3 10*3/uL — ABNORMAL HIGH (ref 0.1–1.0)
Monocytes Relative: 7 %
Neutro Abs: 11.7 10*3/uL — ABNORMAL HIGH (ref 1.7–7.7)
Neutrophils Relative %: 67 %
Platelet Count: 1036 10*3/uL (ref 150–400)
RBC: 3.69 MIL/uL — ABNORMAL LOW (ref 3.87–5.11)
RDW: 24.5 % — ABNORMAL HIGH (ref 11.5–15.5)
Smear Review: INCREASED
WBC Count: 17.3 10*3/uL — ABNORMAL HIGH (ref 4.0–10.5)
nRBC: 0.1 % (ref 0.0–0.2)

## 2023-06-05 LAB — FERRITIN: Ferritin: 573 ng/mL — ABNORMAL HIGH (ref 11–307)

## 2023-06-05 LAB — SAMPLE TO BLOOD BANK

## 2023-06-05 NOTE — Assessment & Plan Note (Signed)
 Anemia due to CKD, and myelofibrosis.   Lab Results  Component Value Date   HGB 9.7 (L) 06/05/2023   TIBC 181 (L) 06/05/2023   IRONPCTSAT 13 06/05/2023   FERRITIN 573 (H) 06/05/2023

## 2023-06-05 NOTE — Telephone Encounter (Signed)
 I reached out to the patient's hematology provider, Dr. Cathie Hoops, in regards to holding patient's aspirin from now until surgery.  He stated that he thought it would be reasonable for her to hold it from now until surgery, but there is risk of developing thrombosis.  I did discuss the risk of thrombosis with the patient's daughter and she is okay with patient holding aspirin.  Patient's daughter also stated that patient had not been taking [place] for the past few months.  I called the patient's nursing facility as well as sent over a fax to hold aspirin and Jakafi until after surgery.  I also spoke with nurse, Elease Hashimoto at the nursing facility.  Discussed with her that daily changes of the Adaptic are okay.  She expressed understanding.

## 2023-06-05 NOTE — Progress Notes (Signed)
 Hematology/Oncology Progress note [phone] [phone]     ASSESSMENT & PLAN:   Myelofibrosis (HCC) Polycythemia vera and  secondary myelofibrosis. Labs are reviewed and discussed with patient. Platelet count has increased to 1036,000 due to currently being off treatments.  Recommend patient to continue hold off until her debridement surgery. After that resume Jakafi 5 mg BID.   Hold aspirin 81 mg prior to her surgery.  Also given that platelet count is above 6M, hold Aspirin to reduce chance of paradoxical bleeding risk.  Resume Aspirin 81 mg daily when she resumes Jakafi.  Patient has declined performance status due to recent multiple procedures.  I recommend patient to follow up with palliative care service for discussion of goals of care.   Anemia in chronic kidney disease Anemia due to CKD, and myelofibrosis.   Lab Results  Component Value Date   HGB 9.7 (L) 06/05/2023   TIBC 181 (L) 06/05/2023   IRONPCTSAT 13 06/05/2023   FERRITIN 573 (H) 06/05/2023      Orders Placed This Encounter  Procedures   CBC with Differential (Cancer Center Only)    Standing Status:   Future    Expected Date:   06/19/2023    Expiration Date:   06/04/2024   Follow up  2 weeks.   All questions were answered. The patient knows to call the clinic with any problems, questions or concerns.  Laura Patience, MD, PhD [facility] Hematology Oncology 06/05/2023      Chief Complaint: Laura Wilcox is a 89 y.o. female with polycythemia vera (PV) and secondary myelofibrosis presents for follow up   PERTINENT ONCOLOGY HISTORY Laura Wilcox is a 89 y.o.afemale who has above oncology history reviewed by me today presented for follow up visit for management of  polycythemia vera  Patient previously followed up by Dr.Corcoran, patient switched care to me on 11/16/20 Extensive medical record review was performed by me  Feb 2014 polycythemia vera and secondary myelofibrosis.  She  presented in 04/2002 with thrombocytosis (835,000).  JAK2 V617F was positive   Bone marrow was on 04/15/2002 revealed a normocellular marrow for age with trilineage hematopoiesis and mild megakaryocytic hyperplasia with focal atypia.  There was no morphologic evidence of a myeloproliferative disorder.  Iron stains were inadequate for evaluation of storage iron.  There was no increase in reticulin fibers.  She was started on Agrylin in 2012  Bone marrow on 09/25/2012 revealed persistent myeloproliferative disorder s/p Agrylin.  The current features were best classified as persistent myeloproliferative neoplasm (MPN) JAK2 V617F mutation positive.  The current features of markedly increased atypical megakaryocytes paresis, with frequent clustering with frequent hyper/deeply lobulated forms, bizarre forms and forms with staghorn nuclei, hyperchromatic forms and abundant cytoplasm was highly suggestive of a myeloproliferative neoplasm.  Marrow was hypercellular for age (30-50%) with increased trilineage hematopoiesis and markedly increased atypical megakaryopoiesis with frequent clustering.  There were no increased blasts.  Iron stores were absent.  There was moderate to severe myelofibrosis (grade MF 2-3).  Differential includes ET, PV, primary myelofibrosis, and MPN, unclassifiable.  Cytogenetics were normal (46, XX). She developed pancytopenia in 12/2015.  Etiology is unclear (medication confusion or hydroxyurea + Agrylin).   She received Procrit 40,000 units (intermittently 09/2012 - 06/2016), Venofer (09/2012 - 10/2012), and Infed (12/2015 - 03/2016).  She received Neupogen during a period of neutropenia.   She was anagrelide from 2004 - 08/13/2017.  She began [place] on 08/14/2017.  Jakafi was increased from 5 mg BID to 10 mg BID  on 06/17/2018.  Jakafi was increased to 15 mg BID on 08/03/2018.  She has been back on Jakafi 5 mg BID since 02/01/2019 then 10 mg BID on 05/25/2019.  Earvin Hansen was on hold from  12/20/2019 - 01/12/2020.  Jakafi 5 mg twice daily restarted on 01/12/2020 then increased to 10 mg in the morning and 5 mg in the evening on 02/02/2020. Earvin Hansen is currently 20 mg po BID.  She has received Retacrit to maintain a hemoglobin > 10 (12/24/2019 and 03   Squamous cell carcinoma left cheek- s/p cryotherapy and debridement. Managed by Dr. Gwen Pounds  10/22/2020- 10/26/2020 Hospitalization due to acute metabolic encephalopathy, UTI,E. coli bacteremia AKI on CKD, acute on chronic anemia. Earvin Hansen was held during the admission. She was discharged and spent a short period of time in rehab. Alta Corning was held.   11/06/2020 seen by symptom management for weakness.  11/16/2020 established care with me. Resumed Jakafi at 10mg  BID  #Dementia/cognitive impairment- patient has hx of dementia. Per daughter, it has been difficult to convince patent to see neurology for further evaluation. She is not on any medication for her memory loss.  She has been referred to home health.  06/25/2021 - 07/02/2021,Patient was hospitalized due to UTI, generalized weakness Jakafi was.  Held. 07/25/2021, resumed on Jakafi 5 mg daily.  07/25/2021, bilateral lower extremity ultrasound were negative for DVT. 08/20/2021, increased Jakafi to 5 mg twice daily. INTERVAL HISTORY [name] is a 89 y.o. female who has above history reviewed by me today presents for follow up visit for management of polycythemia vera and secondary myelofibrosis.   Patient was accompanied by daughter Laura Wilcox  Patient is a poor historian due to dementia. Patient was last seen by me in October 2024. Unfortunately she has had multiple hospitalization due to nonhealing lower extremity wound status post multiple I&D and debridement.  Patient has been off Jakafi treatment since the end of December. Per daughter, patient is going to have another debridement surgery on 06/09/2022.  She is currently on aspirin 81 mg daily.  Past Medical History:  Diagnosis Date    Acute metabolic encephalopathy 10/22/2020   Complication of anesthesia    pt gets confused a day after receiving anesthesia per the daughter   Confusion 12/30/2018   Polycythemia    SCC (squamous cell carcinoma) 04/11/2021   R med dorsum hand - ED&C SCCIS   SCC (squamous cell carcinoma) 10/09/2021   left lateral eye, EDC   SCC (squamous cell carcinoma) 01/28/2022   left lateral eye, recurrent, EDC. 5FU/Calcipotriene cream.   SCC (squamous cell carcinoma) 01/28/2022   in situ, left medial cheek, 5FU/Calcipotriene Cr   SCC (squamous cell carcinoma) 02/26/2022   SCC IS, Right Ulnar Dorsal Hand, 5FU/Calcipotriene cream   SCC (squamous cell carcinoma) 09/03/2022   R ankle, EDC   SCC (squamous cell carcinoma) 01/20/2023   SCC IS Left medial lower leg, EDC   Squamous cell carcinoma of skin 09/06/2020   R thumb - ED&C   Urinary tract infection without hematuria 01/02/2020   Urticaria 06/02/2017    Past Surgical History:  Procedure Laterality Date   ABDOMINAL AORTOGRAM W/LOWER EXTREMITY N/A 04/15/2023   Procedure: ABDOMINAL AORTOGRAM W/LOWER EXTREMITY;  Surgeon: Nada Libman, MD;  Location: [facility];  Service: Cardiovascular;  Laterality: N/A;   ABDOMINAL HYSTERECTOMY     complete   APPLICATION OF WOUND VAC Left 04/21/2023   Procedure: APPLICATION OF WOUND VAC;  Surgeon: Peggye Form, DO;  Location: [facility] OR;  Service: Government social research officer;  Laterality: Left;   APPLICATION OF WOUND VAC Left 04/28/2023   Procedure: WOUND VAC CHANGE;  Surgeon: Peggye Form, DO;  Location: [facility] OR;  Service: Plastics;  Laterality: Left;   APPLICATION OF WOUND VAC N/A 05/05/2023   Procedure: WOUND VAC CHANGE APPLICATION OF SKIN SUBSTITUTE;  Surgeon: Peggye Form, DO;  Location: [facility] OR;  Service: Plastics;  Laterality: N/A;   BREAST SURGERY     HEMATOMA EVACUATION Left 04/02/2023   Procedure: EVACUATION ANKLE HEMATOMA;  Surgeon: Nada Libman, MD;  Location: [facility] OR[facility];  Service: Vascular;   Laterality: Left;   INCISION AND DRAINAGE OF WOUND Left 04/21/2023   Procedure: IRRIGATION AND DEBRIDEMENT WOUND;  Surgeon: Peggye Form, DO;  Location: [facility];  Service: Plastics;  Laterality: Left;   PERIPHERAL VASCULAR BALLOON ANGIOPLASTY  04/15/2023   Procedure: PERIPHERAL VASCULAR BALLOON ANGIOPLASTY;  Surgeon: Nada Libman, MD;  Location: [facility];  Service: Cardiovascular;;   PERIPHERAL VASCULAR INTERVENTION  04/15/2023   Procedure: PERIPHERAL VASCULAR INTERVENTION;  Surgeon: Nada Libman, MD;  Location: [facility];  Service: Cardiovascular;;    Family History  Problem Relation Age of Onset   Heart disease Father    Cancer Sister        breast   Cancer Sister        breast   Alzheimer's disease Sister     Social History:  reports that she has never smoked. She has never used smokeless tobacco. She reports that she does not drink alcohol and does not use drugs.   Allergies:  Allergies  Allergen Reactions   Bactrim [Sulfamethoxazole-Trimethoprim]     Due to kidney disease    Epinephrine Other (See Comments)   Other     Per daughter RUE BP significantly higher than LUE    Current Medications: Current Outpatient Medications  Medication Sig Dispense Refill   acetaminophen (TYLENOL) 325 MG tablet Take 650 mg by mouth See admin instructions. Take 650 mg once daily, may take 650 mg dose every 6 hours as needed for mild pain     Amino Acids-Protein Hydrolys (FEEDING SUPPLEMENT, PRO-STAT SUGAR FREE 64,) LIQD Take by mouth 2 (two) times daily.     ammonium lactate (AMLACTIN) 12 % cream Apply topically qd/bid to dry areas at lower legs and arms (Patient taking differently: Apply 1 Application topically 2 (two) times daily. Apply topically qd/bid to dry areas at lower legs and arms) 385 g 2   ascorbic acid (VITAMIN C) 500 MG tablet Take 500 mg by mouth daily.     aspirin EC 81 MG tablet Take 81 mg by mouth daily. Swallow whole.     calcitRIOL  (ROCALTROL) 0.25 MCG capsule Take 0.25 mcg by mouth every Monday, Wednesday, and Friday.     Cholecalciferol (VITAMIN D3) 25 MCG (1000 UT) CAPS Take 1,000 Units by mouth daily.     citalopram (CELEXA) 10 MG tablet Take 10 mg by mouth daily.     clotrimazole-betamethasone (LOTRISONE) cream Apply 1 Application topically 2 (two) times daily.     ferrous sulfate 325 (65 FE) MG EC tablet Take 325 mg by mouth daily.     hydrocortisone 1 % ointment Apply 1 Application topically daily.     hydrocortisone 2.5 % cream Apply topically twice daily to aa of right cheek as needed for itch (Patient taking differently: Apply 1 Application topically every 12 (twelve) hours as needed (itching of right cheek).) 30 g 3  hydrOXYzine (ATARAX) 25 MG tablet Take 25 mg by mouth every 8 (eight) hours as needed for itching.     levothyroxine (SYNTHROID) 25 MCG tablet Take 25 mcg by mouth daily.     Multiple Vitamin (MULTIVITAMIN WITH MINERALS) TABS tablet Take 1 tablet by mouth daily.     naloxone (NARCAN) nasal spray 4 mg/0.1 mL Place 1 spray into the nose 3 (three) times daily as needed (opioid overdose).     oxyCODONE (OXY IR/ROXICODONE) 5 MG immediate release tablet Take 1 tablet (5 mg total) by mouth every 6 (six) hours as needed for severe pain (pain score 7-10). 30 tablet 0   polyethylene glycol (MIRALAX / GLYCOLAX) 17 g packet Take 17 g by mouth daily as needed for mild constipation. (Patient taking differently: Take 17 g by mouth daily.)     ruxolitinib phosphate (JAKAFI) 5 MG tablet Take 1 tablet (5 mg total) by mouth See admin instructions. Jakafi 10 mg in the morning, 5 mg in the evening. (Patient taking differently: Take 5-10 mg by mouth See admin instructions. Take 10 mg by mouth in the morning and 5 mg in the evening) 90 tablet 1   traZODone (DESYREL) 50 MG tablet Take 25 mg by mouth at bedtime as needed for sleep.     vitamin B-12 (CYANOCOBALAMIN) 500 MCG tablet Take 1,000 mcg by mouth daily.     No current  facility-administered medications for this visit.    Review of Systems  Unable to perform ROS: Dementia    Performance status (ECOG):  2  Vitals Blood pressure 118/65, pulse 85, temperature 98.6 F (37 C), resp. rate 17.   Physical Exam Vitals reviewed.  Constitutional:      General: She is not in acute distress.    Appearance: She is well-developed. She is ill-appearing.     Comments: Frail appearance, patient sits in the wheelchair  Eyes:     General: No scleral icterus. Cardiovascular:     Rate and Rhythm: Normal rate.  Pulmonary:     Effort: Pulmonary effort is normal. No respiratory distress.  Abdominal:     General: There is no distension.     Palpations: Abdomen is soft.  Musculoskeletal:        General: No deformity. Normal range of motion.  Skin:    Coloration: Skin is not pale.     Comments: Nonhealing lower extremity wound  Neurological:     Mental Status: She is alert. Mental status is at baseline.  Psychiatric:        Attention and Perception: Attention normal.        Mood and Affect: Affect normal.        Behavior: Behavior is cooperative.        Cognition and Memory: Cognition is impaired. Memory is impaired.    Labs    Latest Ref Rng & Units 06/05/2023    1:28 PM 04/30/2023    9:06 AM 04/29/2023   11:04 AM  CBC  WBC 4.0 - 10.5 K/uL 17.3  17.6  18.7   Hemoglobin 12.0 - 15.0 g/dL 9.7  9.2  40.9   Hematocrit 36.0 - 46.0 % 32.2  30.2  34.4   Platelets 150 - 400 K/uL 1,036  666  676       Latest Ref Rng & Units 06/05/2023    1:26 PM 04/30/2023    9:06 AM 04/29/2023   11:58 AM  CMP  Glucose 70 - 99 mg/dL 811  914  782  BUN 8 - 23 mg/dL 37  23  23   Creatinine 0.44 - 1.00 mg/dL 6.21  3.08  6.57   Sodium 135 - 145 mmol/L 137  133  133   Potassium 3.5 - 5.1 mmol/L 4.5  3.8  4.7   Chloride 98 - 111 mmol/L 101  95  96   CO2 22 - 32 mmol/L 26  25  25    Calcium 8.9 - 10.3 mg/dL 9.2  9.3  9.4   Total Protein 6.5 - 8.1 g/dL 6.6     Total Bilirubin 0.0 -  1.2 mg/dL 0.6     Alkaline Phos 38 - 126 U/L 99     AST 15 - 41 U/L 15     ALT 0 - 44 U/L 12

## 2023-06-05 NOTE — Assessment & Plan Note (Addendum)
 Polycythemia vera and  secondary myelofibrosis. Labs are reviewed and discussed with patient. Platelet count has increased to 1036,000 due to currently being off treatments.  Recommend patient to continue hold off until her debridement surgery. After that resume Jakafi 5 mg BID.   Hold aspirin 81 mg prior to her surgery.  Also given that platelet count is above 69M, hold Aspirin to reduce chance of paradoxical bleeding risk.  Resume Aspirin 81 mg daily when she resumes Jakafi.  Patient has declined performance status due to recent multiple procedures.  I recommend patient to follow up with palliative care service for discussion of goals of care.

## 2023-06-05 NOTE — Telephone Encounter (Signed)
 Faxed instructions to [facility] [phone] Attn: Nurse Junious Silk stating "Please hold patient's aspirin and Jakafi from now (06/05/23) until after surgery (surgery is on 06/09/23). Please hold multivitamins/supplements from now until after surgery." Confirmation of receipt.

## 2023-06-05 NOTE — Telephone Encounter (Signed)
 Junious Silk, RN manager from [facility] [employer] left message to clarify if changing adaptic daily on foot wound would be ok instead of every other day. Please advise.   Thanks!

## 2023-06-06 ENCOUNTER — Encounter (HOSPITAL_COMMUNITY): Payer: Self-pay | Admitting: Plastic Surgery

## 2023-06-06 ENCOUNTER — Other Ambulatory Visit: Payer: Self-pay

## 2023-06-06 ENCOUNTER — Encounter: Payer: Self-pay | Admitting: Student

## 2023-06-06 NOTE — Progress Notes (Signed)
 I reached out to patient's hematologist, Dr. Cathie Hoops.  He stated that patient's platelets were elevated, but he thought it would be reasonable to hold aspirin from now until surgery.  Plan will be to start aspirin angiography after surgery.  I also discussed patient's elevated white blood cell count with Dr. Ulice Bold.  She stated that she was okay with proceeding with surgery.  Did discuss the possibility of starting antibiotics before surgery, but she said we could hold off on antibiotics for now.

## 2023-06-06 NOTE — Progress Notes (Unsigned)
 [facility] [place]  Patient name: Laura Wilcox MRN: [id number] DOB: 05-20-1926 Sex: female      Virtual Visit via Telephone Note   Because of Lucie Friedlander co-morbid illnesses, she is at least at moderate risk for complications without adequate follow up.  This format is felt to be most appropriate for this patient at this time.  The patient did not have access to video technology/had technical difficulties with video requiring transitioning to audio format only (telephone).  All issues noted in this document were discussed and addressed.  No physical exam could be performed with this format.  Please refer to the patient's chart for her consent to telehealth for [facility].   Patient Location: Home Provider Location: Office/Clinic    REASON FOR APPOINTMENT:    Follow up  HISTORY OF PRESENT ILLNESS:   Sheli Dorin is a 89 y.o. female, who presented to the emergency department in December 2020 for with a hematoma on her left lower leg with obvious skin necrosis.  She went to the operating room on 04/02/2023 for an I&D.  She had difficulty with wound healing and came back into the hospital.  There was concern of arterial insufficiency and so she went to the Cath Lab on 04/15/2023.  She was found to have an occluded left superficial femoral artery which was able to be stented.  She had single-vessel runoff via the peroneal artery.  Her wound is being managed by Dr. Ulice Bold      PAST MEDICAL HISTORY    Past Medical History:  Diagnosis Date   Acute metabolic encephalopathy 10/22/2020   Complication of anesthesia    pt gets confused a day after receiving anesthesia per the daughter   Confusion 12/30/2018   Dementia (HCC)    Hypothyroidism    Polycythemia    SCC (squamous cell carcinoma) 04/11/2021   R med dorsum hand - ED&C SCCIS   SCC (squamous cell carcinoma) 10/09/2021   left lateral eye, EDC   SCC (squamous cell  carcinoma) 01/28/2022   left lateral eye, recurrent, EDC. 5FU/Calcipotriene cream.   SCC (squamous cell carcinoma) 01/28/2022   in situ, left medial cheek, 5FU/Calcipotriene Cr   SCC (squamous cell carcinoma) 02/26/2022   SCC IS, Right Ulnar Dorsal Hand, 5FU/Calcipotriene cream   SCC (squamous cell carcinoma) 09/03/2022   R ankle, EDC   SCC (squamous cell carcinoma) 01/20/2023   SCC IS Left medial lower leg, EDC   Squamous cell carcinoma of skin 09/06/2020   R thumb - ED&C   Urinary tract infection without hematuria 01/02/2020   Urticaria 06/02/2017     FAMILY HISTORY   Family History  Problem Relation Age of Onset   Heart disease Father    Cancer Sister        breast   Cancer Sister        breast   Alzheimer's disease Sister     SOCIAL HISTORY:   Social History   Socioeconomic History   Marital status: Widowed    Spouse name: Not on file   Number of children: Not on file   Years of education: Not on file   Highest education level: Not on file  Occupational History   Not on file  Tobacco Use   Smoking status: Former    Types: Cigarettes   Smokeless tobacco: Never  Vaping Use   Vaping status: Never Used  Substance and Sexual Activity  Alcohol use: No    Comment: occasional glass of wine   Drug use: No   Sexual activity: Not on file  Other Topics Concern   Not on file  Social History Narrative   ** Merged History Encounter **       Social Drivers of Health   Financial Resource Strain: Not on file  Food Insecurity: No Food Insecurity (04/13/2023)   Hunger Vital Sign    Worried About Running Out of Food in the Last Year: Never true    Ran Out of Food in the Last Year: Never true  Transportation Needs: No Transportation Needs (04/13/2023)   PRAPARE - Administrator, Civil Service (Medical): No    Lack of Transportation (Non-Medical): No  Physical Activity: Not on file  Stress: Not on file  Social Connections: Unknown (04/13/2023)   Social  Connection and Isolation Panel [NHANES]    Frequency of Communication with Friends and Family: More than three times a week    Frequency of Social Gatherings with Friends and Family: Once a week    Attends Religious Services: Patient unable to answer    Active Member of Clubs or Organizations: Patient unable to answer    Attends Banker Meetings: Patient unable to answer    Marital Status: Widowed  Intimate Partner Violence: Patient Unable To Answer (04/13/2023)   Humiliation, Afraid, Rape, and Kick questionnaire    Fear of Current or Ex-Partner: Patient unable to answer    Emotionally Abused: Patient unable to answer    Physically Abused: Patient unable to answer    Sexually Abused: Patient unable to answer    ALLERGIES:    Allergies  Allergen Reactions   Bactrim [Sulfamethoxazole-Trimethoprim]     Due to kidney disease    Epinephrine Other (See Comments)   Other     Per daughter RUE BP significantly higher than LUE    CURRENT MEDICATIONS:    Current Outpatient Medications  Medication Sig Dispense Refill   acetaminophen (TYLENOL) 325 MG tablet Take 650 mg by mouth See admin instructions. Take 650 mg once daily, may take 650 mg dose every 6 hours as needed for mild pain     Amino Acids-Protein Hydrolys (FEEDING SUPPLEMENT, PRO-STAT SUGAR FREE 64,) LIQD Take by mouth 2 (two) times daily.     ammonium lactate (AMLACTIN) 12 % cream Apply topically qd/bid to dry areas at lower legs and arms (Patient taking differently: Apply 1 Application topically 2 (two) times daily. Apply topically qd/bid to dry areas at lower legs and arms) 385 g 2   ascorbic acid (VITAMIN C) 500 MG tablet Take 500 mg by mouth daily.     aspirin EC 81 MG tablet Take 81 mg by mouth daily. Swallow whole.     calcitRIOL (ROCALTROL) 0.25 MCG capsule Take 0.25 mcg by mouth every Monday, Wednesday, and Friday.     Cholecalciferol (VITAMIN D3) 25 MCG (1000 UT) CAPS Take 1,000 Units by mouth daily.      citalopram (CELEXA) 10 MG tablet Take 10 mg by mouth daily.     clotrimazole-betamethasone (LOTRISONE) cream Apply 1 Application topically 2 (two) times daily.     ferrous sulfate 325 (65 FE) MG EC tablet Take 325 mg by mouth daily.     hydrocortisone 1 % ointment Apply 1 Application topically daily.     hydrocortisone 2.5 % cream Apply topically twice daily to aa of right cheek as needed for itch (Patient taking differently: Apply 1 Application  topically every 12 (twelve) hours as needed (itching of right cheek).) 30 g 3   hydrOXYzine (ATARAX) 25 MG tablet Take 25 mg by mouth every 8 (eight) hours as needed for itching.     levothyroxine (SYNTHROID) 25 MCG tablet Take 25 mcg by mouth daily.     Multiple Vitamin (MULTIVITAMIN WITH MINERALS) TABS tablet Take 1 tablet by mouth daily.     naloxone (NARCAN) nasal spray 4 mg/0.1 mL Place 1 spray into the nose 3 (three) times daily as needed (opioid overdose).     oxyCODONE (OXY IR/ROXICODONE) 5 MG immediate release tablet Take 1 tablet (5 mg total) by mouth every 6 (six) hours as needed for severe pain (pain score 7-10). 30 tablet 0   polyethylene glycol (MIRALAX / GLYCOLAX) 17 g packet Take 17 g by mouth daily as needed for mild constipation. (Patient taking differently: Take 17 g by mouth daily.)     ruxolitinib phosphate (JAKAFI) 5 MG tablet Take 1 tablet (5 mg total) by mouth See admin instructions. Jakafi 10 mg in the morning, 5 mg in the evening. (Patient taking differently: Take 5-10 mg by mouth See admin instructions. Take 10 mg by mouth in the morning and 5 mg in the evening) 90 tablet 1   traZODone (DESYREL) 50 MG tablet Take 25 mg by mouth at bedtime as needed for sleep.     vitamin B-12 (CYANOCOBALAMIN) 500 MCG tablet Take 1,000 mcg by mouth daily.     No current facility-administered medications for this visit.    REVIEW OF SYSTEMS:   Please see the history of present illness.     All other systems reviewed and are  negative.    Recent Labs: 04/28/2023: Magnesium 1.8 06/05/2023: ALT 12; BUN 37; Creatinine 0.93; Hemoglobin 9.7; Platelet Count 1,036; Potassium 4.5; Sodium 137   Recent Lipid Panel Lab Results  Component Value Date/Time   CHOL 157 04/16/2023 09:11 AM   TRIG 101 04/16/2023 09:11 AM   HDL 37 (L) 04/16/2023 09:11 AM   CHOLHDL 4.2 04/16/2023 09:11 AM   LDLCALC 100 (H) 04/16/2023 09:11 AM    Wt Readings from Last 3 Encounters:  05/05/23 110 lb (49.9 kg)  04/13/23 116 lb 6.4 oz (52.8 kg)  04/13/23 121 lb (54.9 kg)     STUDIES:   I have reviewed the following vascular studies: ABI/TBIToday's ABIToday's TBIPrevious ABIPrevious TBI  +-------+-----------+-----------+------------+------------+  Right 0.68                                            +-------+-----------+-----------+------------+------------+  Left  0.62                                            +-------+-----------+-----------+------------+------------+   Left SFA stent is widely patent  ASSESSMENT and PLAN   Atherosclerotic left leg vascular disease with wound:     COVID-19 Education: The signs and symptoms of COVID-19 were discussed with the patient and how to seek care for testing (follow up with PCP or arrange E-visit).  ***The importance of social distancing was discussed today.  Time:   Today, I have spent *** minutes with the patient with telehealth technology discussing the above problems.    Disposition:  Follow up {follow up:15908}    Durene Cal,  IV, MD, FACS [facility] [phone] Pager [phone]

## 2023-06-06 NOTE — Progress Notes (Addendum)
 SDW CALL  Reviewed patient's history and instructions over the phone with patient's daughter, Mikeal Hawthorne. The opportunity was given for Ms Ward to ask questions. No further questions asked. Patient's daughter verbalized understanding of instructions given.   PCP - Noni Saupe Cardiologist - denies Nephrologist - Munsoor Lateef,MD- [facility]   PPM/ICD - denies Device Orders -  Rep Notified -   Chest x-ray - 04/27/23 EKG - 03/07/23 Stress Test - denies ECHO - denies Cardiac Cath - denies  Sleep Study - denies CPAP -   Fasting Blood Sugar - na Checks Blood Sugar _____ times a day  Blood Thinner Instructions:na Aspirin Instructions:hold from 06/05/23 until after surgery  ERAS Protcol - clears until 1300 PRE-SURGERY Ensure or G2-   COVID TEST- na   Anesthesia review: yes- platelets- 1,036     Surgical Instructions    Your procedure is scheduled on Monday March 10.  Report to [facility] Main Entrance "A" at 1:30 P.M., then check in with the Admitting office.  Call this number if you have problems the morning of surgery:  [phone]    Remember:  Do not eat after midnight the night before your surgery  You may drink clear liquids until 1pm the morning of your surgery.   Clear liquids allowed are: Water, Non-Citrus Juices (without pulp), Carbonated Beverages, Clear Tea, Black Coffee ONLY (NO MILK, CREAM OR POWDERED CREAMER of any kind), and Gatorade   Take these medicines the morning of surgery with A SIP OF WATER: Celexa, Synthroid. If needed you may take Tylenol,Atarax,Oxycodone IR.  As instructed by your surgeon please hold Aspirin,Jakafi,multivitamins and supplements from 06/05/23 until after surgery.   As of today, STOP taking any  Aleve, Naproxen, Ibuprofen, Motrin, Advil, Goody's, BC's, all herbal medications, fish oil, and all vitamins.  [facility] is not responsible for any belongings or valuables. .   Do NOT Smoke (Tobacco/Vaping)  24 hours  prior to your procedure  If you use a CPAP at night, you may bring your mask for your overnight stay.   Contacts, glasses, hearing aids, dentures or partials may not be worn into surgery, please bring cases for these belongings   Patients discharged the day of surgery will not be allowed to drive home, and someone needs to stay with them for 24 hours.    Special instructions:    Oral Hygiene is also important to reduce your risk of infection.  Remember - BRUSH YOUR TEETH THE MORNING OF SURGERY WITH YOUR REGULAR TOOTHPASTE   Day of Surgery:  Take a shower the day of or night before with antibacterial soap. Wear Clean/Comfortable clothing the morning of surgery Do not apply any deodorants/lotions.   Do not wear jewelry or makeup Do not wear lotions, powders, perfumes/colognes, or deodorant. Do not shave 48 hours prior to surgery.  Men may shave face and neck. Do not bring valuables to the hospital. Do not wear nail polish, gel polish, artificial nails, or any other type of covering on natural nails (fingers and toes) If you have artificial nails or gel coating that need to be removed by a nail salon, please have this removed prior to surgery. Artificial nails or gel coating may interfere with anesthesia's ability to adequately monitor your vital signs. Remember to brush your teeth WITH YOUR REGULAR TOOTHPASTE.   PLEASE SEND A COPY OF THE PATIENT'S MOST RECENT MAR INDICATING THE LAST TIME EACH MEDICATION WAS GIVEN.

## 2023-06-06 NOTE — Progress Notes (Signed)
 Spoke with Junious Silk RN at [facility] and reviewed surgical instructions which were faxed to facility [phone]) earlier in the day. Ms. Laura Wilcox acknowledged receipt of the instructions and verbalized understanding. Opportunity to ask questions was given. No further questions asked. Ms Laura Wilcox stated that documentation indicating the last time medications were given to patient would be sent day of surgery. Phone number for [facility]-[phone]. Patient's daughter to meet patient at hospital.

## 2023-06-06 NOTE — Anesthesia Preprocedure Evaluation (Addendum)
 Anesthesia Evaluation  Patient identified by MRN, date of birth, ID band Patient confused    Reviewed: Allergy & Precautions, NPO status , Patient's Chart, lab work & pertinent test results, reviewed documented beta blocker date and time   History of Anesthesia Complications (+) history of anesthetic complications  Airway Mallampati: II  TM Distance: >3 FB     Dental  (+) Poor Dentition   Pulmonary neg COPD, former smoker, neg PE   breath sounds clear to auscultation       Cardiovascular Exercise Tolerance: Poor + Peripheral Vascular Disease   Rhythm:Regular Rate:Normal     Neuro/Psych neg Seizures PSYCHIATRIC DISORDERS  Depression   Dementia dementia   GI/Hepatic ,,,(+) neg Cirrhosis        Endo/Other  Hypothyroidism    Renal/GU CRFRenal disease     Musculoskeletal   Abdominal   Peds  Hematology  (+) Blood dyscrasia, anemia Severe thrombocytosis   Anesthesia Other Findings   Reproductive/Obstetrics                              Anesthesia Physical Anesthesia Plan  ASA: 3  Anesthesia Plan: Regional and MAC   Post-op Pain Management: Regional block*   Induction: Intravenous  PONV Risk Score and Plan: 2 and Ondansetron, Propofol infusion and TIVA  Airway Management Planned: Natural Airway and Simple Face Mask  Additional Equipment:   Intra-op Plan:   Post-operative Plan:   Informed Consent: I have reviewed the patients History and Physical, chart, labs and discussed the procedure including the risks, benefits and alternatives for the proposed anesthesia with the patient or authorized representative who has indicated his/her understanding and acceptance.     Dental advisory given and Consent reviewed with POA  Plan Discussed with:   Anesthesia Plan Comments: (PAT note by Antionette Poles, PA-C: 89 year old female follows with oncology for history of polycythemia vera and  secondary myelofibrosis.  Last seen by Dr. Cathie Hoops 06/05/2023.  Per note, "Polycythemia vera and  secondary myelofibrosis. Labs are reviewed and discussed with patient. Platelet count has increased to 1036,000 due to currently being off treatments. Recommend patient to continue hold off until her debridement surgery. After that resume Jakafi 5 mg BID.  Hold aspirin 81 mg prior to her surgery.  Also given that platelet count is above 64M, hold Aspirin to reduce chance of paradoxical bleeding risk. Resume Aspirin 81 mg daily when she resumes Jakafi. Patient has declined performance status due to recent multiple procedures. I recommend patient to follow up with palliative care service for discussion of goals of care."  She is currently following with plastic surgery for history of left leg wound.  She has undergone multiple recent procedures for this, most recently excision of the wound with placement of myriad with Dr. Ulice Bold on 05/05/2023.  Other pertinent history includes dementia, hypothyroid, renal insufficiency, PAD s/p stenting of the left SFA 04/15/2023.  CMP 06/05/2023 reviewed, unremarkable.  CBC 06/05/2023 reviewed, chronic anemia hemoglobin 9.7, mildly elevated WBC at 17.3, platelets markedly elevated at 1036.  Patient will need day of surgery evaluation.   )         Anesthesia Quick Evaluation

## 2023-06-06 NOTE — Progress Notes (Signed)
  Surgical Instructions                 Your procedure is scheduled on Monday March 10.             Report to [facility] Main Entrance "A" at 1:30 P.M., then check in with the Admitting office.             Call this number if you have problems the morning of surgery:             [phone]                 Remember:             Do not eat after midnight the night before your surgery   You may drink clear liquids until 1pm the morning of your surgery.   Clear liquids allowed are: Water, Non-Citrus Juices (without pulp), Carbonated Beverages, Clear Tea, Black Coffee ONLY (NO MILK, CREAM OR POWDERED CREAMER of any kind), and Gatorade               Take these medicines the morning of surgery with A SIP OF WATER: Celexa, Synthroid. If needed you may take Tylenol,Atarax,Oxycodone IR.   As instructed by your surgeon please hold Aspirin,Jakafi,multivitamins and supplements from 06/05/23 until after surgery.    As of today, STOP taking any  Aleve, Naproxen, Ibuprofen, Motrin, Advil, Goody's, BC's, all herbal medications, fish oil, and all vitamins.   [facility] is not responsible for any belongings or valuables. .    Do NOT Smoke (Tobacco/Vaping)  24 hours prior to your procedure   If you use a CPAP at night, you may bring your mask for your overnight stay.   Contacts, glasses, hearing aids, dentures or partials may not be worn into surgery, please bring cases for these belongings   Patients discharged the day of surgery will not be allowed to drive home, and someone needs to stay with them for 24 hours.       Special instructions:     Oral Hygiene is also important to reduce your risk of infection.  Remember - BRUSH YOUR TEETH THE MORNING OF SURGERY WITH YOUR REGULAR TOOTHPASTE     Day of Surgery:   Take a shower the day of or night before with antibacterial soap. Wear Clean/Comfortable clothing the morning of surgery Do not apply any deodorants/lotions.   Do not wear jewelry or  makeup Do not wear lotions, powders, perfumes/colognes, or deodorant. Do not shave 48 hours prior to surgery.  Men may shave face and neck. Do not bring valuables to the hospital. Do not wear nail polish, gel polish, artificial nails, or any other type of covering on natural nails (fingers and toes) If you have artificial nails or gel coating that need to be removed by a nail salon, please have this removed prior to surgery. Artificial nails or gel coating may interfere with anesthesia's ability to adequately monitor your vital signs. Remember to brush your teeth WITH YOUR REGULAR TOOTHPASTE.     PLEASE SEND A COPY OF THE PATIENT'S MOST RECENT MAR INDICATING THE LAST TIME EACH MEDICATION WAS GIVEN.

## 2023-06-06 NOTE — Progress Notes (Signed)
 Anesthesia Chart Review: Same day workup  89 year old female follows with oncology for history of polycythemia vera and secondary myelofibrosis.  Last seen by Dr. Cathie Hoops 06/05/2023.  Per note, "Polycythemia vera and  secondary myelofibrosis. Labs are reviewed and discussed with patient. Platelet count has increased to 1036,000 due to currently being off treatments. Recommend patient to continue hold off until her debridement surgery. After that resume Jakafi 5 mg BID.  Hold aspirin 81 mg prior to her surgery.  Also given that platelet count is above 46M, hold Aspirin to reduce chance of paradoxical bleeding risk. Resume Aspirin 81 mg daily when she resumes Jakafi. Patient has declined performance status due to recent multiple procedures. I recommend patient to follow up with palliative care service for discussion of goals of care."  She is currently following with plastic surgery for history of left leg wound.  She has undergone multiple recent procedures for this, most recently excision of the wound with placement of myriad with Dr. Ulice Bold on 05/05/2023.  Other pertinent history includes dementia, hypothyroid, renal insufficiency, PAD s/p stenting of the left SFA 04/15/2023.  CMP 06/05/2023 reviewed, unremarkable.  CBC 06/05/2023 reviewed, chronic anemia hemoglobin 9.7, mildly elevated WBC at 17.3, platelets markedly elevated at 1036.  Patient will need day of surgery evaluation.    Laura Wilcox [facility]/Anesthesiology Phone [phone] 06/06/2023 1:05 PM

## 2023-06-06 NOTE — H&P (View-Only) (Signed)
 I reached out to patient's hematologist, Dr. Cathie Hoops.  He stated that patient's platelets were elevated, but he thought it would be reasonable to hold aspirin from now until surgery.  Plan will be to start aspirin angiography after surgery.  I also discussed patient's elevated white blood cell count with Dr. Ulice Bold.  She stated that she was okay with proceeding with surgery.  Did discuss the possibility of starting antibiotics before surgery, but she said we could hold off on antibiotics for now.

## 2023-06-09 ENCOUNTER — Ambulatory Visit (HOSPITAL_COMMUNITY): Payer: Self-pay | Admitting: Physician Assistant

## 2023-06-09 ENCOUNTER — Encounter (HOSPITAL_COMMUNITY): Payer: Self-pay | Admitting: Plastic Surgery

## 2023-06-09 ENCOUNTER — Encounter (HOSPITAL_COMMUNITY)
Admission: RE | Disposition: A | Discharge status: Skilled Nursing Facility | Payer: Self-pay | Source: Home / Self Care | Attending: Plastic Surgery

## 2023-06-09 ENCOUNTER — Inpatient Hospital Stay (HOSPITAL_COMMUNITY)
Admission: RE | Admit: 2023-06-09 | Discharge: 2023-06-11 | DRG: 982 | Disposition: A | Discharge status: Skilled Nursing Facility | Payer: No Typology Code available for payment source | Attending: Plastic Surgery | Admitting: Plastic Surgery

## 2023-06-09 ENCOUNTER — Other Ambulatory Visit: Payer: Self-pay

## 2023-06-09 DIAGNOSIS — N1832 Chronic kidney disease, stage 3b: Secondary | ICD-10-CM

## 2023-06-09 DIAGNOSIS — D7581 Myelofibrosis: Secondary | ICD-10-CM | POA: Diagnosis present

## 2023-06-09 DIAGNOSIS — E039 Hypothyroidism, unspecified: Secondary | ICD-10-CM | POA: Diagnosis not present

## 2023-06-09 DIAGNOSIS — F32A Depression, unspecified: Secondary | ICD-10-CM

## 2023-06-09 DIAGNOSIS — S81802A Unspecified open wound, left lower leg, initial encounter: Principal | ICD-10-CM | POA: Diagnosis present

## 2023-06-09 DIAGNOSIS — I739 Peripheral vascular disease, unspecified: Secondary | ICD-10-CM | POA: Diagnosis present

## 2023-06-09 DIAGNOSIS — Y929 Unspecified place or not applicable: Secondary | ICD-10-CM

## 2023-06-09 DIAGNOSIS — F0390 Unspecified dementia without behavioral disturbance: Secondary | ICD-10-CM | POA: Diagnosis present

## 2023-06-09 DIAGNOSIS — X58XXXA Exposure to other specified factors, initial encounter: Secondary | ICD-10-CM | POA: Diagnosis present

## 2023-06-09 DIAGNOSIS — Z87891 Personal history of nicotine dependence: Secondary | ICD-10-CM

## 2023-06-09 HISTORY — PX: APPLICATION OF WOUND VAC: SHX5189

## 2023-06-09 HISTORY — DX: Unspecified dementia, unspecified severity, without behavioral disturbance, psychotic disturbance, mood disturbance, and anxiety: F03.90

## 2023-06-09 HISTORY — PX: I & D EXTREMITY: SHX5045

## 2023-06-09 HISTORY — DX: Hypothyroidism, unspecified: E03.9

## 2023-06-09 LAB — SURGICAL PCR SCREEN
MRSA, PCR: NEGATIVE
Staphylococcus aureus: POSITIVE — AB

## 2023-06-09 SURGERY — IRRIGATION AND DEBRIDEMENT EXTREMITY
Anesthesia: Monitor Anesthesia Care | Site: Leg Lower | Laterality: Left

## 2023-06-09 MED ORDER — KCL IN DEXTROSE-NACL 20-5-0.45 MEQ/L-%-% IV SOLN
INTRAVENOUS | Status: AC
  Filled 2023-06-09 (×2): qty 1000

## 2023-06-09 MED ORDER — DIPHENHYDRAMINE HCL 12.5 MG/5ML PO ELIX: 12.5000 mg | ORAL_SOLUTION | Freq: Four times a day (QID) | ORAL | Status: DC | PRN

## 2023-06-09 MED ORDER — FENTANYL CITRATE (PF) 100 MCG/2ML IJ SOLN
INTRAMUSCULAR | Status: AC
  Filled 2023-06-09: qty 2

## 2023-06-09 MED ORDER — DIPHENHYDRAMINE HCL 50 MG/ML IJ SOLN: 12.5000 mg | Freq: Four times a day (QID) | INTRAMUSCULAR | Status: DC | PRN

## 2023-06-09 MED ORDER — ONDANSETRON 4 MG PO TBDP: 4.0000 mg | ORAL_TABLET | Freq: Four times a day (QID) | ORAL | Status: DC | PRN

## 2023-06-09 MED ORDER — ONDANSETRON HCL 4 MG/2ML IJ SOLN: 4.0000 mg | Freq: Four times a day (QID) | INTRAMUSCULAR | Status: DC | PRN

## 2023-06-09 MED ORDER — ZOLPIDEM TARTRATE 5 MG PO TABS
5.0000 mg | ORAL_TABLET | Freq: Every evening | ORAL | Status: DC | PRN
  Administered 2023-06-09: 5 mg via ORAL
  Filled 2023-06-09: qty 1

## 2023-06-09 MED ORDER — FENTANYL CITRATE PF 50 MCG/ML IJ SOSY: 50.0000 ug | PREFILLED_SYRINGE | Freq: Once | INTRAMUSCULAR | Status: DC

## 2023-06-09 MED ORDER — SENNA 8.6 MG PO TABS
1.0000 | ORAL_TABLET | Freq: Two times a day (BID) | ORAL | Status: DC
  Administered 2023-06-09 – 2023-06-11 (×3): 8.6 mg via ORAL
  Filled 2023-06-09 (×4): qty 1

## 2023-06-09 MED ORDER — CHLORHEXIDINE GLUCONATE CLOTH 2 % EX PADS: 6.0000 | MEDICATED_PAD | Freq: Once | CUTANEOUS | Status: DC

## 2023-06-09 MED ORDER — CHLORHEXIDINE GLUCONATE 0.12 % MT SOLN
15.0000 mL | OROMUCOSAL | Status: AC
Start: 2023-06-09 — End: 2023-06-09
  Administered 2023-06-09: 15 mL via OROMUCOSAL
  Filled 2023-06-09: qty 15

## 2023-06-09 MED ORDER — PROPOFOL 10 MG/ML IV BOLUS
INTRAVENOUS | Status: DC | PRN
  Administered 2023-06-09: 50 ug/kg/min via INTRAVENOUS
  Administered 2023-06-09: 20 mg via INTRAVENOUS
  Administered 2023-06-09: 30 mg via INTRAVENOUS

## 2023-06-09 MED ORDER — LACTATED RINGERS IV SOLN: INTRAVENOUS | Status: DC

## 2023-06-09 MED ORDER — HYDROMORPHONE HCL 1 MG/ML IJ SOLN: 0.5000 mg | INTRAMUSCULAR | Status: DC | PRN

## 2023-06-09 MED ORDER — AMISULPRIDE (ANTIEMETIC) 5 MG/2ML IV SOLN: 10.0000 mg | Freq: Once | INTRAVENOUS | Status: DC | PRN

## 2023-06-09 MED ORDER — FENTANYL CITRATE PF 50 MCG/ML IJ SOSY
25.0000 ug | PREFILLED_SYRINGE | Freq: Once | INTRAMUSCULAR | Status: AC
  Administered 2023-06-09: 25 ug via INTRAVENOUS
  Filled 2023-06-09: qty 0.5

## 2023-06-09 MED ORDER — POLYETHYLENE GLYCOL 3350 17 G PO PACK: 17.0000 g | PACK | Freq: Every day | ORAL | Status: DC | PRN

## 2023-06-09 MED ORDER — OXYCODONE HCL 5 MG PO TABS
5.0000 mg | ORAL_TABLET | ORAL | Status: DC | PRN
  Filled 2023-06-09: qty 1

## 2023-06-09 MED ORDER — 0.9 % SODIUM CHLORIDE (POUR BTL) OPTIME
TOPICAL | Status: DC | PRN
Start: 2023-06-09 — End: 2023-06-09
  Administered 2023-06-09: 1000 mL

## 2023-06-09 MED ORDER — PHENYLEPHRINE 80 MCG/ML (10ML) SYRINGE FOR IV PUSH (FOR BLOOD PRESSURE SUPPORT)
PREFILLED_SYRINGE | INTRAVENOUS | Status: DC | PRN
  Administered 2023-06-09: 160 ug via INTRAVENOUS

## 2023-06-09 MED ORDER — CEFAZOLIN SODIUM-DEXTROSE 2-4 GM/100ML-% IV SOLN
2.0000 g | Freq: Three times a day (TID) | INTRAVENOUS | Status: DC
  Administered 2023-06-09 – 2023-06-11 (×5): 2 g via INTRAVENOUS
  Filled 2023-06-09 (×6): qty 100

## 2023-06-09 MED ORDER — ACETAMINOPHEN 10 MG/ML IV SOLN: 750.0000 mg | Freq: Once | INTRAVENOUS | Status: DC | PRN

## 2023-06-09 MED ORDER — PHENYLEPHRINE HCL-NACL 20-0.9 MG/250ML-% IV SOLN
INTRAVENOUS | Status: DC | PRN
  Administered 2023-06-09: 40 ug/min via INTRAVENOUS

## 2023-06-09 MED ORDER — FENTANYL CITRATE (PF) 100 MCG/2ML IJ SOLN: 25.0000 ug | INTRAMUSCULAR | Status: DC | PRN

## 2023-06-09 MED ORDER — ACETAMINOPHEN 325 MG PO TABS
325.0000 mg | ORAL_TABLET | Freq: Four times a day (QID) | ORAL | Status: DC
  Administered 2023-06-09 – 2023-06-11 (×5): 325 mg via ORAL
  Filled 2023-06-09 (×7): qty 1

## 2023-06-09 MED ORDER — IBUPROFEN 400 MG PO TABS
200.0000 mg | ORAL_TABLET | Freq: Four times a day (QID) | ORAL | Status: DC
  Administered 2023-06-09 – 2023-06-11 (×5): 200 mg via ORAL
  Filled 2023-06-09 (×8): qty 1

## 2023-06-09 MED ORDER — ROPIVACAINE HCL 5 MG/ML IJ SOLN
INTRAMUSCULAR | Status: DC | PRN
  Administered 2023-06-09: 10 mL via PERINEURAL
  Administered 2023-06-09: 20 mL via PERINEURAL

## 2023-06-09 MED ORDER — VASHE WOUND IRRIGATION OPTIME
TOPICAL | Status: DC | PRN
Start: 2023-06-09 — End: 2023-06-09
  Administered 2023-06-09: 34 [oz_av]

## 2023-06-09 MED ORDER — ONDANSETRON HCL 4 MG/2ML IJ SOLN: 4.0000 mg | Freq: Once | INTRAMUSCULAR | Status: DC | PRN

## 2023-06-09 MED ORDER — CEFAZOLIN SODIUM-DEXTROSE 2-4 GM/100ML-% IV SOLN
2.0000 g | INTRAVENOUS | Status: AC
  Administered 2023-06-09: 2 g via INTRAVENOUS
  Filled 2023-06-09: qty 100

## 2023-06-09 SURGICAL SUPPLY — 30 items
BNDG ELASTIC 4INX 5YD STR LF (GAUZE/BANDAGES/DRESSINGS) IMPLANT
BNDG GAUZE DERMACEA FLUFF 4 (GAUZE/BANDAGES/DRESSINGS) IMPLANT
CANISTER SUCT 3000ML PPV (MISCELLANEOUS) ×1 IMPLANT
CANISTER WOUNDNEG PRESSURE 500 (CANNISTER) IMPLANT
CLEANSER WND VASHE 34 (WOUND CARE) IMPLANT
COVER SURGICAL LIGHT HANDLE (MISCELLANEOUS) ×1 IMPLANT
DRAPE HALF SHEET 40X57 (DRAPES) IMPLANT
DRAPE INCISE IOBAN 66X45 STRL (DRAPES) IMPLANT
DRAPE SURG ORHT 6 SPLT 77X108 (DRAPES) IMPLANT
DRSG CUTIMED SORBACT 7X9 (GAUZE/BANDAGES/DRESSINGS) IMPLANT
DRSG HYDROCOLLOID 4X4 (GAUZE/BANDAGES/DRESSINGS) ×1 IMPLANT
DRSG VAC GRANUFOAM MED (GAUZE/BANDAGES/DRESSINGS) IMPLANT
ELECT REM PT RETURN 9FT ADLT (ELECTROSURGICAL) ×1 IMPLANT
ELECTRODE REM PT RTRN 9FT ADLT (ELECTROSURGICAL) ×1 IMPLANT
GAUZE PAD ABD 8X10 STRL (GAUZE/BANDAGES/DRESSINGS) IMPLANT
GAUZE SPONGE 4X4 12PLY STRL (GAUZE/BANDAGES/DRESSINGS) IMPLANT
GLOVE BIO SURGEON STRL SZ 6.5 (GLOVE) ×2 IMPLANT
GLOVE INDICATOR 8.0 STRL GRN (GLOVE) ×1 IMPLANT
GOWN STRL REUS W/ TWL LRG LVL3 (GOWN DISPOSABLE) ×2 IMPLANT
GRAFT MYRIAD 7X10 (Graft) IMPLANT
KIT BASIN OR (CUSTOM PROCEDURE TRAY) ×1 IMPLANT
KIT DRSG PREVENA PLUS 7DAY 125 (MISCELLANEOUS) IMPLANT
KIT TURNOVER KIT B (KITS) ×1 IMPLANT
NS IRRIG 1000ML POUR BTL (IV SOLUTION) ×1 IMPLANT
PACK GENERAL/GYN (CUSTOM PROCEDURE TRAY) ×1 IMPLANT
PAD ARMBOARD 7.5X6 YLW CONV (MISCELLANEOUS) ×2 IMPLANT
POWDER MYRIAD MORCELLS 500MG (Miscellaneous) IMPLANT
SUT VIC AB 5-0 PS2 18 (SUTURE) IMPLANT
TOWEL GREEN STERILE (TOWEL DISPOSABLE) ×1 IMPLANT
UNDERPAD 30X36 HEAVY ABSORB (UNDERPADS AND DIAPERS) IMPLANT

## 2023-06-09 NOTE — Discharge Instructions (Signed)
Wound Care   Guide to Wound Care  Proper wound care may reduce the risk of infection, improve healing rates, and limit scarring.  This is a general guide to help care for and manage wounds treated with product wound matrix.   Dressing Changes The frequency of dressing changes can vary based on which product was applied, the size of the wound, or the amount of wound drainage. Dressing inspections are recommended, at least weekly.   If you have a Wound VAC it will be changed in one week after the first time it is applied.  Then it will be changed once or twice a week.   If you don't have a Wound VAC, then place KY gel on the wound daily and cover with gauze.  Dressing Types Primary Dressing:  Non-adherent dressing goes directly over wounds being treated with the powder or sheet.  Secondary Dressing:  Secures the primary dressing in place and provides extra protection, compression, and absorption.  1. Wash Hands - To help decrease the risk of infection, caregivers should wash their hands for a minimum of 20 seconds and may use medical gloves.   2. Remove the Dressings - Avoid removing product from the wound by carefully removing the applicable dressing(s) at the time points recommended above, or as recommended by the treating physician.  Expected Color and Odor:  It is entirely normal for the wound to have an unpleasant odor and to form a caramel-colored gel as the product absorbs into the wound. It is important to leave this gel on the wound site.  3. Clean the Wound - Use clean water or saline to gently rinse around the wound surface and remove any excess discharge that may be present on the wound. Do not wipe off any of the caramel-colored gel on the wound.   What to look out for:  Large or increased amount of drainage   Surrounding skin has worsening redness or hot to touch   Increased pain in or around the wound   Flu-like symptoms, fatigue, decreased appetite, fever   Hard, crusty wound  surface with black or brown coloring  4. Apply New Dressings - Dressings should cover the entire wound and be suitable for maintaining a moist wound environment.  The non-adherent mesh dressing should be left in place.  New dressing should consist of KY Jelly to keep the wound moist and soft gauze secured with a wrap or tape.   Maintain a Hydrated Wound Area It is important to keep the wound area moist throughout the healing process. If the wound appears to be dry during dressing changes, select a dressing that will hydrate the wound and maintain that ideal moist environment. If you are unsure what to do, ask the treating physician.  Remodeling Process Every patient heals differently, and no two cases are the same. The size and location of the wound, product type and layering configurations, and general patient health all contribute to how quickly a wound will heal.  While many factors can influence the rate at which the product absorbs, the following can be used as a general guide.   THINGS TO DO: Refrain from smoking High protein diet with plenty of vegetables and some fruit  Limit simple processed carbohydrates and sugar Protect the wound from trauma Protect the dressing  powder       Sheet            Sorbact dressing  

## 2023-06-09 NOTE — Progress Notes (Signed)
 Patient's daughter stated that patient has known bed sores to bilateral heels and along her spine down to her sacrum that are being treated at her long term care facility. Also has open area to left upper thigh that was caused by nursing home staff trying to transfer patient. Surgeon aware.

## 2023-06-09 NOTE — Interval H&P Note (Signed)
 History and Physical Interval Note:  06/09/2023 5:24 PM  Laura Wilcox  has presented today for surgery, with the diagnosis of foot wound.  The various methods of treatment have been discussed with the patient and family. After consideration of risks, benefits and other options for treatment, the patient has consented to  Procedure(s): irrigation and debridement of left lower extremity wound with placement of myriad and possible placement of wound vac (Left) APPLICATION, SKIN SUBSTITUTE (Left) APPLICATION, WOUND VAC (Left) as a surgical intervention.  The patient's history has been reviewed, patient examined, no change in status, stable for surgery.  I have reviewed the patient's chart and labs.  Questions were answered to the patient's satisfaction.     [name] [name]

## 2023-06-09 NOTE — Transfer of Care (Signed)
 Immediate Anesthesia Transfer of Care Note  Patient: Laura Wilcox  Procedure(s) Performed: irrigation and debridement of left lower extremity wound with placement of myriad and possible placement of wound vac (Left: Leg Lower) APPLICATION, SKIN SUBSTITUTE (Left: Leg Lower) APPLICATION, WOUND VAC (Left: Leg Lower)  Patient Location: PACU  Anesthesia Type:MAC and Regional  Level of Consciousness: awake and confused  Airway & Oxygen Therapy: Patient Spontanous Breathing  Post-op Assessment: Report given to RN and Post -op Vital signs reviewed and stable  Post vital signs: Reviewed and stable  Last Vitals:  Vitals Value Taken Time  BP 133/58 06/09/23 1819  Temp    Pulse 81   Resp 16 06/09/23 1823  SpO2 95   Vitals shown include unfiled device data.  Last Pain:  Vitals:   06/09/23 1403  PainSc: 0-No pain      Patients Stated Pain Goal: 0 (06/09/23 1403)  Complications: There were no known notable events for this encounter.

## 2023-06-09 NOTE — Anesthesia Procedure Notes (Signed)
 Anesthesia Regional Block: Adductor canal block   Pre-Anesthetic Checklist: , timeout performed,  Correct Patient, Correct Site, Correct Laterality,  Correct Procedure, Correct Position, site marked,  Risks and benefits discussed,  Surgical consent,  Pre-op evaluation,  At surgeon's request and post-op pain management  Laterality: Left  Prep: Maximum Sterile Barrier Precautions used, chloraprep       Needles:  Injection technique: Single-shot  Needle Type: Echogenic Needle      Needle Gauge: 20     Additional Needles:   Procedures:,,,, ultrasound used (permanent image in chart),,    Narrative:  Start time: 06/09/2023 4:00 AM End time: 06/09/2023 4:02 AM Injection made incrementally with aspirations every 5 mL.  Performed by: Personally  Anesthesiologist: Mariann Barter, MD

## 2023-06-09 NOTE — Anesthesia Postprocedure Evaluation (Signed)
 Anesthesia Post Note  Patient: Laura Wilcox  Procedure(s) Performed: irrigation and debridement of left lower extremity wound with placement of myriad and placement of wound vac (Left: Leg Lower) APPLICATION, SKIN SUBSTITUTE (Left: Leg Lower) APPLICATION, WOUND VAC (Left: Leg Lower)     Patient location during evaluation: PACU Anesthesia Type: Regional Level of consciousness: awake and alert and patient cooperative Pain management: pain level controlled Vital Signs Assessment: post-procedure vital signs reviewed and stable Respiratory status: spontaneous breathing, nonlabored ventilation and respiratory function stable Cardiovascular status: stable and blood pressure returned to baseline Postop Assessment: no apparent nausea or vomiting Anesthetic complications: no   There were no known notable events for this encounter.  Last Vitals:  Vitals:   06/09/23 1600 06/09/23 1830  BP:  129/60  Pulse:  84  Resp: 17 17  Temp:  36.6 C  SpO2:  96%    Last Pain:  Vitals:   06/09/23 1830  PainSc: 0-No pain                 Cantrell Larouche,E. Prithvi Kooi

## 2023-06-09 NOTE — Progress Notes (Signed)
 Platelet count reviewed with anesthesia. No new orders at this time.

## 2023-06-09 NOTE — Op Note (Signed)
 DATE OF OPERATION: 06/09/2023  LOCATION: [facility] Room  PREOPERATIVE DIAGNOSIS: left leg wounds 7 x 10 cm  POSTOPERATIVE DIAGNOSIS: Same  PROCEDURE:  Excision of left leg wound skin, soft tissue and tendon 7 x 10 cm  Placement of 7 x 10 cm myriad and 500 mg powder. Placement of VAC  SURGEON: Broadus Costilla, DO  EBL: less than 5 cc  CONDITION: Stable  COMPLICATIONS: None  INDICATION: The patient, Laura Wilcox, is a 89 y.o. female born on 1927/02/01, is here for treatment of a left leg wound.   PROCEDURE DETAILS:  The patient was seen prior to surgery and marked.  The IV antibiotics were given. The patient was taken to the operating room and given a general anesthetic. A standard time out was performed and all information was confirmed by those in the room. SCD was placed on the right leg.  The left leg was prepped and draped.  The leg was irrigated with Vashe.  There was nonviable skin and soft tissue that was in the 7 x 10 cm wound.  The #10 and tissue scissors were used to excise the nonviable skin and soft tissue. There were also 2 tendons that were nonviable and desiccated.  These were excised with the tissue scissors.  The wound was irrigated again.  All of the myriad powder and sheet was applied with the sorbact and secured in place with the 5-0 Vicryl. The VAC and KY gel was applied and there was an excellent seal. The leg was wrapped with kerlex, gauze and an Ace wrap. The patient was allowed to wake up and taken to recovery room in stable condition at the end of the case. The family was notified at the end of the case.

## 2023-06-09 NOTE — Anesthesia Procedure Notes (Signed)
 Anesthesia Regional Block: Popliteal block   Pre-Anesthetic Checklist: , timeout performed,  Correct Patient, Correct Site, Correct Laterality,  Correct Procedure, Correct Position, site marked,  Risks and benefits discussed,  Surgical consent,  Pre-op evaluation,  At surgeon's request and post-op pain management  Laterality: Left  Prep: Maximum Sterile Barrier Precautions used, chloraprep       Needles:  Injection technique: Single-shot  Needle Type: Echogenic Needle      Needle Gauge: 20     Additional Needles:   Procedures:,,,, ultrasound used (permanent image in chart),,    Narrative:  Start time: 06/09/2023 3:55 PM End time: 06/09/2023 4:00 PM Injection made incrementally with aspirations every 5 mL.  Performed by: Personally  Anesthesiologist: Mariann Barter, MD

## 2023-06-10 ENCOUNTER — Encounter (HOSPITAL_COMMUNITY): Payer: Self-pay | Admitting: Plastic Surgery

## 2023-06-10 DIAGNOSIS — X58XXXA Exposure to other specified factors, initial encounter: Secondary | ICD-10-CM | POA: Diagnosis present

## 2023-06-10 DIAGNOSIS — Z87891 Personal history of nicotine dependence: Secondary | ICD-10-CM | POA: Diagnosis not present

## 2023-06-10 DIAGNOSIS — F0390 Unspecified dementia without behavioral disturbance: Secondary | ICD-10-CM | POA: Diagnosis present

## 2023-06-10 DIAGNOSIS — Y929 Unspecified place or not applicable: Secondary | ICD-10-CM | POA: Diagnosis not present

## 2023-06-10 DIAGNOSIS — S81802A Unspecified open wound, left lower leg, initial encounter: Secondary | ICD-10-CM | POA: Diagnosis present

## 2023-06-10 DIAGNOSIS — D7581 Myelofibrosis: Secondary | ICD-10-CM | POA: Diagnosis present

## 2023-06-10 DIAGNOSIS — I739 Peripheral vascular disease, unspecified: Secondary | ICD-10-CM | POA: Diagnosis present

## 2023-06-10 DIAGNOSIS — E039 Hypothyroidism, unspecified: Secondary | ICD-10-CM | POA: Diagnosis present

## 2023-06-10 MED ORDER — CHLORHEXIDINE GLUCONATE CLOTH 2 % EX PADS
6.0000 | MEDICATED_PAD | Freq: Every day | CUTANEOUS | Status: DC
  Administered 2023-06-10 – 2023-06-11 (×2): 6 via TOPICAL

## 2023-06-10 MED ORDER — MUPIROCIN 2 % EX OINT
1.0000 | TOPICAL_OINTMENT | Freq: Two times a day (BID) | CUTANEOUS | Status: DC
  Administered 2023-06-10 – 2023-06-11 (×3): 1 via NASAL
  Filled 2023-06-10 (×2): qty 22

## 2023-06-10 NOTE — Progress Notes (Signed)
 1 Day Post-Op  Subjective: 89 year old female status post debridement of left lower extremity wounds with Dr. Ulice Bold yesterday.  She underwent debridement, application of myriad wound matrix and placement of wound VAC.  Patient was at a SNF prior to surgery.  Patient's daughter and Child psychotherapist at bedside, discussing discharge planning to SNF.  Patient was previously at [facility].  Patient's daughter has reported that she was not receiving adequate care on one of the floors, was subsequently transferred to another floor and noticed improved wound care.  Patient's daughter reports that patient has been very sleepy today, she is not sure if it is from the Ambien that she had last night.  She does not typically take Ambien per daughter.  Objective: Vital signs in last 24 hours: Temp:  [97.4 F (36.3 C)-97.8 F (36.6 C)] 97.8 F (36.6 C) (03/11 0612) Pulse Rate:  [61-98] 89 (03/11 1312) Resp:  [11-20] 17 (03/11 1312) BP: (84-151)/(54-88) 135/66 (03/11 1312) SpO2:  [88 %-100 %] 90 % (03/11 1312) Last BM Date : 06/09/23 (small)  Intake/Output from previous day: 03/10 0701 - 03/11 0700 In: 425 [P.O.:25; I.V.:300; IV Piggyback:100] Out: 10 [Blood:10] Intake/Output this shift: No intake/output data recorded.  General appearance: Arousable, but sleeping. Resp: Unlabored breathing Extremities: Left lower extremity with wound VAC in place, good seal noted, minimal drainage in wound VAC canister.  Left lower extremity dressing is unsoiled Incision/Wound: Wound not evaluated due to dressing in place.  Lab Results:     Latest Ref Rng & Units 06/05/2023    1:28 PM 04/30/2023    9:06 AM 04/29/2023   11:04 AM  CBC  WBC 4.0 - 10.5 K/uL 17.3  17.6  18.7   Hemoglobin 12.0 - 15.0 g/dL 9.7  9.2  16.1   Hematocrit 36.0 - 46.0 % 32.2  30.2  34.4   Platelets 150 - 400 K/uL 1,036  666  676     BMET No results for input(s): "NA", "K", "CL", "CO2", "GLUCOSE", "BUN",  "CREATININE", "CALCIUM" in the last 72 hours. PT/INR No results for input(s): "LABPROT", "INR" in the last 72 hours. ABG No results for input(s): "PHART", "HCO3" in the last 72 hours.  Invalid input(s): "PCO2", "PO2"  Studies/Results: No results found.  Anti-infectives: Anti-infectives (From admission, onward)    Start     Dose/Rate Route Frequency Ordered Stop   06/10/23 0600  ceFAZolin (ANCEF) IVPB 2g/100 mL premix        2 g 200 mL/hr over 30 Minutes Intravenous On call to O.R. 06/09/23 1320 06/09/23 1814   06/10/23 0000  ceFAZolin (ANCEF) IVPB 2g/100 mL premix        2 g 200 mL/hr over 30 Minutes Intravenous Every 8 hours 06/09/23 1817 06/16/23 2159       Assessment/Plan: s/p Procedure(s): irrigation and debridement of left lower extremity wound with placement of myriad and placement of wound vac APPLICATION, SKIN SUBSTITUTE  Patient is postop day 1 from debridement of wound of left lower extremity yesterday application of myriad wound matrix and application of wound VAC.  Vital signs have overall been very stable.  Discussed with patient's daughter plan is for reevaluation tomorrow, if patient is discharged to SNF, can switch from hospital wound VAC to [facility] wound VAC and plan for initial wound VAC change 1 week from surgery on 06/16/2023.  Patient will remain hospitalized tonight for ongoing discharge planning to SNF, appreciate assistance from case management and social worker team.  We will plan  to reevaluate patient in a.m. and discuss discharge planning.  DVT prophylaxis with SCD on right lower extremity. Discontinued Ambien. Normal diet.   LOS: 0 days    Leslee Home, PA-C 06/10/2023

## 2023-06-10 NOTE — Care Management Obs Status (Signed)
 MEDICARE OBSERVATION STATUS NOTIFICATION   Patient Details  Name: Laura Wilcox MRN: [id number] Date of Birth: 07/24/1926   Medicare Observation Status Notification Given:  Yes    Kingsley Plan, RN 06/10/2023, 10:22 AM

## 2023-06-10 NOTE — Progress Notes (Cosign Needed Addendum)
 Received call from patient's nurse in regards to patient's BP. Per nurse, patient's BP was soft, although improved after taking several times. She reports patient is awake, alert and conversing. HR is 80 bpm. Patient's nurse denies any significant drainage from the cannister.   Recommended nurse encourage the patient eat and drink plenty of fluids. Also recommended she continue to monitor BP and if it does not improve or worsen to contact our on-call service.   Also made Dr. Ulice Bold aware. Will also increase fluid rate to 50 mls / hr

## 2023-06-10 NOTE — TOC Initial Note (Addendum)
 Transition of Care (TOC) - Initial/Assessment Note   Spoke to patient's daughter Mikeal Hawthorne at bedside. Patient from [facility].   Patient has been there receiving wound care .   Prior to that she was at memory care in [place].   [name] has spoken to staff at [facility] regarding wound care. And care has improved.  In the past when patient had VAC daughter reports Dr Ulice Bold has changed VAC dressing. Will clarify with Dr Ulice Bold.    Plan is for patient to return to [facility] . [facility] will change the [facility]AC weekly. SW working with [facility]. [facility] cannot get a VAC today. NCM called Dawn with [facility]. 59M will call [facility] if [facility] agreeable to work with them , then 3 M will order [facility] for patient to use at [facility]. Dawn not sure if they can get a VAC today or not. Dawn will update NCM as soon as she knows. MD and PA aware  NCM explained above to daughter at bedside. Daughter voiced understanding   [facility] will need to order a negative wound pressure system and will not have it until tomorrow. Daughter and medical team aware  Patient Details  Name: Laura Wilcox MRN: [id number] Date of Birth: [birth year]  Transition of Care [facility]) CM/SW Contact:    Kingsley Plan, RN Phone Number: 06/10/2023, 12:44 PM  Clinical Narrative:                   Expected Discharge Plan: Skilled Nursing Facility Barriers to Discharge: Continued Medical Work up   Patient Goals and CMS Choice            Expected Discharge Plan and Services       Living arrangements for the past 2 months: Skilled Nursing Facility                                      Prior Living Arrangements/Services Living arrangements for the past 2 months: Skilled Nursing Facility                     Activities of Daily Living   ADL Screening (condition at time of admission) Independently performs ADLs?: No Is the patient deaf or have difficulty hearing?: No Does the  patient have difficulty seeing, even when wearing glasses/contacts?: No Does the patient have difficulty concentrating, remembering, or making decisions?: Yes  Permission Sought/Granted                  Emotional Assessment              Admission diagnosis:  Leg wound, left [S81.802A] Patient Active Problem List   Diagnosis Date Noted   Leg wound, left 06/09/2023   Wound of left leg 04/21/2023   Cellulitis 04/13/2023   Hematoma of left ankle 03/31/2023   Hematoma 03/31/2023   SCC (squamous cell carcinoma) 03/19/2022   Skin tear of lower leg without complication, right, initial encounter 09/24/2021   Sepsis (HCC) 09/24/2021   Leg wound, right 09/24/2021   Encounter for antineoplastic chemotherapy 09/21/2021   Senile dementia with acute confusional state, without behavioral disturbance (HCC) 06/26/2021   Depression 06/26/2021   Weakness 06/26/2021   UTI (urinary tract infection) 06/25/2021   Myelofibrosis (HCC) 01/31/2020   Other pancytopenia (HCC) 01/31/2020   Moderate protein-calorie malnutrition (HCC)    Squamous cell cancer of skin of left  cheek 01/16/2020   Chronic kidney disease, stage 3b (HCC) 03/11/2019   Secondary hyperparathyroidism of renal origin (HCC) 03/11/2019   Anemia in chronic kidney disease 03/11/2019   Elevated ferritin 02/18/2018   Goals of care, counseling/discussion 07/09/2017   Polycythemia rubra vera (HCC) 06/02/2017   Protein-calorie malnutrition, moderate (HCC) 06/02/2017   Advanced care planning/counseling discussion 06/02/2017   Age-related cognitive decline 06/02/2017   PCP:  Smiley Houseman, NP Pharmacy:  No Pharmacies Listed    Social Drivers of Health (SDOH) Social History: SDOH Screenings   Food Insecurity: No Food Insecurity (04/13/2023)  Housing: Low Risk  (04/13/2023)  Transportation Needs: No Transportation Needs (04/13/2023)  Utilities: Not At Risk (04/13/2023)  Alcohol Screen: Low Risk  (06/18/2021)  Depression  (PHQ2-9): High Risk (06/18/2021)  Social Connections: Unknown (04/13/2023)  Tobacco Use: Medium Risk (06/09/2023)   SDOH Interventions:     Readmission Risk Interventions    06/26/2021    4:26 PM  Readmission Risk Prevention Plan  Transportation Screening Complete  PCP or Specialist Appt within 5-7 Days Complete  Home Care Screening Complete  Medication Review (RN CM) Complete

## 2023-06-11 ENCOUNTER — Other Ambulatory Visit: Payer: Self-pay

## 2023-06-11 ENCOUNTER — Encounter: Payer: Self-pay | Admitting: Hematology and Oncology

## 2023-06-11 NOTE — Plan of Care (Signed)
  Problem: Pain Managment: Goal: General experience of comfort will improve and/or be controlled Outcome: Progressing   Problem: Safety: Goal: Ability to remain free from injury will improve Outcome: Progressing   Problem: Skin Integrity: Goal: Risk for impaired skin integrity will decrease Outcome: Progressing

## 2023-06-11 NOTE — TOC Transition Note (Signed)
 Transition of Care [facility]) - Discharge Note   Patient Details  Name: Laura Wilcox MRN: [id number] Date of Birth: [birth year]  [facility] CM/SW Contact:  Carley Hammed, LCSW Phone Number: 06/11/2023, 10:52 AM   Clinical Narrative:    Pt to be transported back to [facility] [place] via [facility].  Nurse to call report to [phone] Rm# [phone]  CSW answered questions and discussed payor sources with family. CSW explained Medicaid process and LTC as well. Family notes understanding. TOC to sign off at this time.   Final next level of care: Skilled Nursing Facility Barriers to Discharge: Barriers Resolved   Patient Goals and CMS Choice            Discharge Placement              Patient chooses bed at: [facility] [place] Patient to be transferred to facility by: [facility] Name of family member notified: [name], daughter Patient and family notified of of transfer: 06/11/23  Discharge Plan and Services Additional resources added to the After Visit Summary for                                       Social Drivers of Health (SDOH) Interventions SDOH Screenings   Food Insecurity: No Food Insecurity (06/10/2023)  Housing: Low Risk  (06/11/2023)  Transportation Needs: No Transportation Needs (06/10/2023)  Utilities: Not At Risk (06/10/2023)  Alcohol Screen: Low Risk  (06/18/2021)  Depression (PHQ2-9): High Risk (06/18/2021)  Social Connections: Unknown (06/10/2023)  Tobacco Use: Medium Risk (06/09/2023)     Readmission Risk Interventions    06/26/2021    4:26 PM  Readmission Risk Prevention Plan  Transportation Screening Complete  PCP or Specialist Appt within 5-7 Days Complete  Home Care Screening Complete  Medication Review (RN CM) Complete

## 2023-06-11 NOTE — Discharge Summary (Signed)
 Physician Discharge Summary  Patient ID: Laura Wilcox MRN: [id number] DOB/AGE: [birth year] 89 y.o.  Admit date: 06/09/2023 Discharge date: 06/11/2023  Admission Diagnoses:  Discharge Diagnoses:  Principal Problem:   Leg wound, left   Discharged Condition: good  Hospital Course: Patient is a very pleasant 89 year old female who presented to the [facility] OR on 06/09/2023 for debridement of left lower extremity wound, application of myriad wound matrix and application of wound VAC to left lower distal extremity wound.  Patient stayed overnight for 2 nights for postoperative care with plans to return to his SNF for ongoing care.  No acute overnight events last night, RN did notify provider that patient had a soft blood pressure, but vital signs were otherwise stable.  Patient was otherwise alert and doing well.  [name] is accompanied by her daughter and son at bedside, as well as Child psychotherapist.  They are working on discharge planning to [facility].  Patient is alert, talking, watching TV and has no specific complaints.  She is a bit anxious about having her left lower extremity evaluated/having the wound VAC changed from hospital VAC To Prevena VAC.  Patient's daughter reports that she has been feeling much more alert today than she was yesterday.  She does report that she was a little bit nauseous earlier and had a small episode of vomiting.  Consults: None  Significant Diagnostic Studies: None  Treatments: IV hydration, antibiotics: Ancef, and analgesia: acetaminophen and ibuprofen  Discharge Exam: Blood pressure (!) 101/50, pulse 81, temperature 98.3 F (36.8 C), temperature source Oral, resp. rate 17, height 5' (1.524 m), weight 49.9 kg, SpO2 98%. General appearance: alert, cooperative, no distress, and family at bedside patient is lying flat in bed, watching TV Head: Normocephalic, without obvious abnormality, atraumatic Resp: Unlabored Extremities:  Left lower extremity with dressing in place, unsoiled.  Wound VAC with good seal noted.  Minimal drainage in canister.  Distal extremity is warm to touch.   Disposition: Discharge disposition: 03-Skilled Nursing Facility       Discharge Instructions     Call MD for:  difficulty breathing, headache or visual disturbances   Complete by: As directed    Call MD for:  persistant nausea and vomiting   Complete by: As directed    Call MD for:  persistant nausea and vomiting   Complete by: As directed    Call MD for:  redness, tenderness, or signs of infection (pain, swelling, redness, odor or green/yellow discharge around incision site)   Complete by: As directed    Call MD for:  redness, tenderness, or signs of infection (pain, swelling, redness, odor or green/yellow discharge around incision site)   Complete by: As directed    Call MD for:  severe uncontrolled pain   Complete by: As directed    Call MD for:  severe uncontrolled pain   Complete by: As directed    Call MD for:  temperature >100.4   Complete by: As directed    Call MD for:  temperature >100.4   Complete by: As directed    Diet - low sodium heart healthy   Complete by: As directed    Increase activity slowly   Complete by: As directed    Increase activity slowly   Complete by: As directed       Allergies as of 06/11/2023       Reactions   Bactrim [sulfamethoxazole-trimethoprim]    Due to kidney disease    Epinephrine Other (  See Comments)   Other    Per daughter RUE BP significantly higher than LUE        Medication List     TAKE these medications    acetaminophen 325 MG tablet Commonly known as: TYLENOL Take 650 mg by mouth See admin instructions. Take 650 mg once daily, may take 650 mg dose every 6 hours as needed for mild pain   ammonium lactate 12 % cream Commonly known as: AMLACTIN Apply topically qd/bid to dry areas at lower legs and arms What changed:  how much to take how to take  this when to take this   ascorbic acid 500 MG tablet Commonly known as: VITAMIN C Take 500 mg by mouth daily.   aspirin EC 81 MG tablet Take 81 mg by mouth daily. Swallow whole.   calcitRIOL 0.25 MCG capsule Commonly known as: ROCALTROL Take 0.25 mcg by mouth every Monday, Wednesday, and Friday.   citalopram 10 MG tablet Commonly known as: CELEXA Take 10 mg by mouth daily.   clotrimazole-betamethasone cream Commonly known as: LOTRISONE Apply 1 Application topically 2 (two) times daily.   feeding supplement (PRO-STAT SUGAR FREE 64) Liqd Take by mouth 2 (two) times daily.   ferrous sulfate 325 (65 FE) MG EC tablet Take 325 mg by mouth daily.   hydrocortisone 1 % ointment Apply 1 Application topically daily. What changed: Another medication with the same name was changed. Make sure you understand how and when to take each.   hydrocortisone 2.5 % cream Apply topically twice daily to aa of right cheek as needed for itch What changed:  how much to take how to take this when to take this reasons to take this additional instructions   hydrOXYzine 25 MG tablet Commonly known as: ATARAX Take 25 mg by mouth every 8 (eight) hours as needed for itching.   Jakafi 5 MG tablet Generic drug: ruxolitinib phosphate Take 2 tablets in the morning and 1 tablet in the evening as directed. (Take 1 tablet (5 mg total) by mouth See admin instructions. Jakafi 10 mg in the morning, 5 mg in the evening.)   levothyroxine 25 MCG tablet Commonly known as: SYNTHROID Take 25 mcg by mouth daily.   multivitamin with minerals Tabs tablet Take 1 tablet by mouth daily.   Narcan 4 MG/0.1ML Liqd nasal spray kit Generic drug: naloxone Place 1 spray into the nose 3 (three) times daily as needed (opioid overdose).   oxyCODONE 5 MG immediate release tablet Commonly known as: Oxy IR/ROXICODONE Take 1 tablet (5 mg total) by mouth every 6 (six) hours as needed for severe pain (pain score 7-10).    polyethylene glycol 17 g packet Commonly known as: MIRALAX / GLYCOLAX Take 17 g by mouth daily as needed for mild constipation. What changed: when to take this   traZODone 50 MG tablet Commonly known as: DESYREL Take 25 mg by mouth at bedtime as needed for sleep.   vitamin B-12 500 MCG tablet Commonly known as: CYANOCOBALAMIN Take 1,000 mcg by mouth daily.   Vitamin D3 25 MCG (1000 UT) Caps Take 1,000 Units by mouth daily.        Follow-up Information     Dillingham, [name], DO Follow up in 10 day(s).   Specialty: Plastic Surgery Contact information: [address] [place] [address] [phone]                 [facility] [address] [place],  [place] [address] [phone]  Signed: Kermit Balo Timarie Labell 06/11/2023, 12:43 PM

## 2023-06-11 NOTE — Plan of Care (Signed)
 Patient ID: [name], female   DOB: 1926-09-23, 89 y.o.   MRN: [id number]  Problem: Education: Goal: Knowledge of General Education information will improve Description: Including pain rating scale, medication(s)/side effects and non-pharmacologic comfort measures Outcome: Adequate for Discharge   Problem: Health Behavior/Discharge Planning: Goal: Ability to manage health-related needs will improve Outcome: Adequate for Discharge   Problem: Clinical Measurements: Goal: Ability to maintain clinical measurements within normal limits will improve Outcome: Adequate for Discharge Goal: Will remain free from infection Outcome: Adequate for Discharge Goal: Diagnostic test results will improve Outcome: Adequate for Discharge Goal: Respiratory complications will improve Outcome: Adequate for Discharge Goal: Cardiovascular complication will be avoided Outcome: Adequate for Discharge   Problem: Activity: Goal: Risk for activity intolerance will decrease Outcome: Adequate for Discharge   Problem: Nutrition: Goal: Adequate nutrition will be maintained Outcome: Adequate for Discharge   Problem: Coping: Goal: Level of anxiety will decrease Outcome: Adequate for Discharge   Problem: Elimination: Goal: Will not experience complications related to bowel motility Outcome: Adequate for Discharge Goal: Will not experience complications related to urinary retention Outcome: Adequate for Discharge   Problem: Pain Managment: Goal: General experience of comfort will improve and/or be controlled Outcome: Adequate for Discharge   Problem: Safety: Goal: Ability to remain free from injury will improve Outcome: Adequate for Discharge   Problem: Skin Integrity: Goal: Risk for impaired skin integrity will decrease Outcome: Adequate for Discharge    Lidia Collum, RN

## 2023-06-11 NOTE — Progress Notes (Signed)
 Unable to complete CSSRs dt patient AMS

## 2023-06-11 NOTE — NC FL2 (Addendum)
 [place] MEDICAID FL2 LEVEL OF CARE FORM     IDENTIFICATION  Patient Name: Laura Wilcox Birthdate: 20-Jan-1927 Sex: female Admission Date (Current Location): 06/09/2023  [facility] and [place] Number:  Producer, television/film/video and Address:  [facility] [place], [address] [place], [place] [address]      Provider Number: [id number]  Attending Physician Name and Address:  Peggye Form, DO  Relative Name and Phone Number:       Current Level of Care: Hospital Recommended Level of Care: Skilled Nursing Facility Prior Approval Number:    Date Approved/Denied:   PASRR Number: [id number]  Discharge Plan: SNF    Current Diagnoses: Patient Active Problem List   Diagnosis Date Noted   Leg wound, left 06/09/2023   Wound of left leg 04/21/2023   Cellulitis 04/13/2023   Hematoma of left ankle 03/31/2023   Hematoma 03/31/2023   SCC (squamous cell carcinoma) 03/19/2022   Skin tear of lower leg without complication, right, initial encounter 09/24/2021   Sepsis (HCC) 09/24/2021   Leg wound, right 09/24/2021   Encounter for antineoplastic chemotherapy 09/21/2021   Senile dementia with acute confusional state, without behavioral disturbance (HCC) 06/26/2021   Depression 06/26/2021   Weakness 06/26/2021   UTI (urinary tract infection) 06/25/2021   Myelofibrosis (HCC) 01/31/2020   Other pancytopenia (HCC) 01/31/2020   Moderate protein-calorie malnutrition (HCC)    Squamous cell cancer of skin of left cheek 01/16/2020   Chronic kidney disease, stage 3b (HCC) 03/11/2019   Secondary hyperparathyroidism of renal origin (HCC) 03/11/2019   Anemia in chronic kidney disease 03/11/2019   Elevated ferritin 02/18/2018   Goals of care, counseling/discussion 07/09/2017   Polycythemia rubra vera (HCC) 06/02/2017   Protein-calorie malnutrition, moderate (HCC) 06/02/2017   Advanced care planning/counseling discussion 06/02/2017   Age-related cognitive decline  06/02/2017    Orientation RESPIRATION BLADDER Height & Weight     Self  Normal Incontinent Weight: 110 lb (49.9 kg) Height:  5' (152.4 cm)  BEHAVIORAL SYMPTOMS/MOOD NEUROLOGICAL BOWEL NUTRITION STATUS      Continent Diet (See DC summary)  AMBULATORY STATUS COMMUNICATION OF NEEDS Skin   Extensive Assist Verbally  L leg incision with wound vac                       Personal Care Assistance Level of Assistance  Bathing, Feeding, Dressing Bathing Assistance: Maximum assistance Feeding assistance: Limited assistance Dressing Assistance: Maximum assistance     Functional Limitations Info  Sight, Hearing, Speech Sight Info: Adequate Hearing Info: Adequate Speech Info: Adequate    SPECIAL CARE FACTORS FREQUENCY  PT (By licensed PT), OT (By licensed OT)     PT Frequency: 5x week OT Frequency: 5x week            Contractures Contractures Info: Not present    Additional Factors Info  Code Status, Allergies Code Status Info: Full Allergies Info: Bactrim (Sulfamethoxazole-trimethoprim)  Epinephrine  Other           Current Medications (06/11/2023):  This is the current hospital active medication list Current Facility-Administered Medications  Medication Dose Route Frequency Provider Last Rate Last Admin   acetaminophen (TYLENOL) tablet 325 mg  325 mg Oral Q6H Dillingham, Claire S, DO   325 mg at 06/11/23 0533   ceFAZolin (ANCEF) IVPB 2g/100 mL premix  2 g Intravenous Q8H Dillingham, Claire S, DO 200 mL/hr at 06/11/23 0536 2 g at 06/11/23 0536   Chlorhexidine Gluconate Cloth 2 %  PADS 6 each  6 each Topical Daily Dillingham, [name], DO   6 each at 06/11/23 1054   diphenhydrAMINE (BENADRYL) 12.5 MG/5ML elixir 12.5 mg  12.5 mg Oral Q6H PRN Dillingham, [name], DO       Or   diphenhydrAMINE (BENADRYL) injection 12.5 mg  12.5 mg Intravenous Q6H PRN Dillingham, [name], DO       HYDROmorphone (DILAUDID) injection 0.5 mg  0.5 mg Intravenous Q2H PRN Dillingham, [name],  DO       ibuprofen (ADVIL) tablet 200 mg  200 mg Oral QID Dillingham, Claire S, DO   200 mg at 06/11/23 1053   lactated ringers infusion   Intravenous Continuous [name], PA-C 50 mL/hr at 06/10/23 2243 Rate Change at 06/10/23 2243   mupirocin ointment (BACTROBAN) 2 % 1 Application  1 Application Nasal BID Peggye Form, DO   1 Application at 06/11/23 1054   ondansetron (ZOFRAN-ODT) disintegrating tablet 4 mg  4 mg Oral Q6H PRN Dillingham, [name], DO       Or   ondansetron (ZOFRAN) injection 4 mg  4 mg Intravenous Q6H PRN Dillingham, [name], DO       oxyCODONE (Oxy IR/ROXICODONE) immediate release tablet 5 mg  5 mg Oral Q4H PRN Dillingham, [name], DO       polyethylene glycol (MIRALAX / GLYCOLAX) packet 17 g  17 g Oral Daily PRN Dillingham, [name], DO       senna (SENOKOT) tablet 8.6 mg  1 tablet Oral BID Dillingham, Claire S, DO   8.6 mg at 06/11/23 1052     Discharge Medications: Please see discharge summary for a list of discharge medications.  Relevant Imaging Results:  Relevant Lab Results:   Additional Information SSN [id number]  Carley Hammed, LCSW

## 2023-06-11 NOTE — Progress Notes (Signed)
 Patient ID: Laura Wilcox, female   DOB: February 12, 1927, 89 y.o.   MRN: [id number] Report called to [facility] and given to [employer]. Spoke with NT Jasmine and asked to have patient dressed and ready for transport.  Lidia Collum, RN

## 2023-06-12 ENCOUNTER — Telehealth: Payer: Self-pay

## 2023-06-12 ENCOUNTER — Telehealth: Payer: Self-pay | Admitting: Plastic Surgery

## 2023-06-12 NOTE — Telephone Encounter (Signed)
 Hazle Nordmann from [employer] left a voicemail asking the weight bearing status of this patient. I spoke with [facility] and he said her weight bearing status is 'as tolerated'. I called [place] back and let her know.

## 2023-06-12 NOTE — Telephone Encounter (Signed)
 Called [place] @ [facility] back at call # documented, [phone], na, left voicemail.

## 2023-06-12 NOTE — Telephone Encounter (Signed)
 Clarification on order on how to treat it and if its covered by the wound vac, please reach out and advise

## 2023-06-13 ENCOUNTER — Telehealth: Payer: Self-pay | Admitting: Surgical

## 2023-06-13 NOTE — Telephone Encounter (Signed)
 Faxed discharge instructions to [facility] at [phone].

## 2023-06-13 NOTE — Telephone Encounter (Addendum)
 The following discharge instructions were placed in patient chart prior to discharge from the hospital earlier this week on 06/11/23.  These orders were provided by nursing staff to [facility] at discharge.  I have discussed this with CMA's and RN in our office and they have assisted with corresponding with [facility].  We will fax these instructions today to provide them with the information.  It is important that the patient continues to receive PT and OT for optimizing recovery.  Wound care to left leg: Proximal left leg wound: Apply K-Y jelly daily starting 06/12/2023 to proximal leg wound.  Remove Ace wrap, Kerlix, ABD pads and 4 x 4 gauze down to green Sorbact mesh. The Ace wrap and Kerlix are covering the entire leg wound, therefore entire wrap will need to be removed.   Do not remove wound VAC from Distal leg wound.  Apply surgical lubricant/K-Y jelly to green mesh on proximal leg wound/knee area, cover with 4 x 4 gauze, rewrap entire left lower extremity from distal foot to proximal leg just above the left proximal leg wound starting with Kerlix, followed by Ace wrap.  Be sure to cushion the leg with ABD pads and avoid having the wound VAC tubing directly against patient's skin.  Distal left leg wound: -Initial wound VAC change will be completed in office on 06/17/2023.  -We will perform initial wound VAC change and provide updated orders for wound dressing changes.   -Current wound care plan is to apply wound VAC once weekly to left lower extremity wound.    Patient needs ongoing OT and PT evaluation for assistance with standing and transferring, ambulation.  No restrictions from plastic surgery standpoint.  Activity as tolerated.  Patient may benefit from pre-PT/OT Tylenol or other pain medications for assistance with pain control during activity.  May also need to be evaluated by staff physician at SNF for assistance with anxiety related to pain due to activity.  Patient will be sent  home with a Prevena wound VAC, this wound VAC can be plugged then and charged.  It does only last for 1 week, therefore the dressing will be changed in our office 1 week from now as stated above on 06/16/2023.  She will need a wound VAC ordered for her to continue to use while undergoing ongoing wound care.   Please have patient bring wound VAC to her follow-up appointment with our office on 06/16/2023.  Her appointment is at 1:40 PM with Amie Critchley, PA-C.  Addendum made on 06/13/2023: Patient will need assistance by nursing staff/sitter with eating to encourage optimizing nutritional status. Patient has difficulty feeding self per family. Recommend glucerna protein shakes twice daily.

## 2023-06-13 NOTE — Telephone Encounter (Signed)
 Faxed discharge summary with wound instructions to [facility]. Received fax success confirmation.

## 2023-06-16 ENCOUNTER — Ambulatory Visit: Payer: PRIVATE HEALTH INSURANCE | Admitting: Student

## 2023-06-16 VITALS — BP 126/64 | HR 62

## 2023-06-16 DIAGNOSIS — S81802D Unspecified open wound, left lower leg, subsequent encounter: Secondary | ICD-10-CM | POA: Diagnosis not present

## 2023-06-16 NOTE — Progress Notes (Signed)
 Referring Provider Smiley Houseman, NP [id number] [place],  [place] [address]   CC:  Chief Complaint  Patient presents with   post op      Laura Wilcox is an 89 y.o. female.  HPI: Patient is a 89 year old female with history of a wound to her left leg.  She most recently underwent excision of left leg wound skin, soft tissue and tendon with placement of myriad and placement of wound VAC with Dr. Ulice Bold on 06/09/2023.  Patient is 1 week out from her procedure.  She presents to the clinic today for postoperative follow-up.  Per chart review, wound care to the proximal left leg wound is as follows: Sorbact in place, apply K-Y jelly to Sorbact, cover with 4 x 4 gauze.  Leave wound VAC in place.  Rewrap the entire left lower extremity from the distal foot to the proximal leg with Kerlix and Ace wrap.  ABD pads to cushion around the wound VAC tubing for comfort.  Today, patient presents with daughter at bedside.  Daughter reports that patient is doing well.  She states that she has not received wound VAC for patient.  She also states that she has not heard from vascular surgery yet since imaging was done about a week or 2 ago.  Review of Systems General: No recent changes in health  Physical Exam    06/16/2023   12:44 PM 06/11/2023    5:31 PM 06/11/2023   10:00 AM  Vitals with BMI  Systolic 126 130 865  Diastolic 64 72 50  Pulse 62 68 81    General:  No acute distress,  Alert and oriented, Non-Toxic, Normal speech and affect On exam, patient is sitting upright in no acute distress.  Prevena wound VAC is in place over the lower left extremity wound.  It appears to have adequate seal.  Proximal wound appears to be healing well with healthy tissue throughout.  There is also a pressure sore noted to the left heel, it appears to be unstageable as the entirety of it is 1 big eschar.  There is no surrounding erythema or drainage.  Left foot looks mildly  swollen.  Assessment/Plan  Wound of left lower extremity, subsequent encounter   I called the SNF in regards to the status of the patient's wound VAC.  They state that they suspect the wound VAC will arrive today or tomorrow.  I did try to reach out to [employer] to see if we could get a wound VAC through them, but SNF will not allow wound VAC through [employer].  Nurse at the SNF is going to call our office back when they have the wound VAC.  Once wound VAC is obtained, we will have the patient return for her first wound VAC change.  I discussed this with the patient's daughter and she expressed understanding.  I also discussed with the nurse at the SNF that if the [facility] runs out of batteries and the wound VAC has not yet arrived, they will have to do daily dressing changes.  Discussed that they should remove wound VAC down to the green mesh (Sorbact).  Discussed then they can apply K-Y jelly, gauze, ABD pad and wrap with Kerlix and Ace wrap.  Recommend to apply Vashe soaked gauze to the proximal wound for 5 to 10 minutes/day.  Recommend to remove the gauze, apply Adaptic, K-Y jelly, gauze and ABD pads and wrap with Ace and Kerlix daily.  I also informed the  SNF nurse about patient's left foot ulcer.  She states that this is being managed by the provider at the SNF and they are aware of the ulcer.  I did place a Mepilex border dressing at today's visit for extra cushioning to the ulcer, discussed with the nurse that they may remove this and continue with wound care as they have been.  I also discussed with her that I will make sure Dr. Ulice Bold is aware and if she has any recommendations that I will let her know.  RN expressed understanding.  Patient to hopefully follow-up later this week once wound VAC has been obtained for first wound VAC change.  Recommended that patient elevate her left lower extremity to help with the swelling.  I also discussed with the daughter that she should reach out to vascular  surgery to see what their recommendations are after recent imaging/tests.  I instructed the daughter to call us if she has any questions or concerns about anything.  Pictures were obtained of the patient and placed in the chart with the patient's or guardian's permission.   [name] 06/16/2023, 1:41 PM

## 2023-06-17 ENCOUNTER — Ambulatory Visit (INDEPENDENT_AMBULATORY_CARE_PROVIDER_SITE_OTHER): Admitting: Student

## 2023-06-17 VITALS — BP 131/67 | HR 61

## 2023-06-17 DIAGNOSIS — S81802D Unspecified open wound, left lower leg, subsequent encounter: Secondary | ICD-10-CM

## 2023-06-17 NOTE — Progress Notes (Signed)
   Referring Provider Smiley Houseman, NP [phone] [address] [place],  [place] [address]   CC:  Chief Complaint  Patient presents with   post op      Laura Wilcox is an 89 y.o. female.  HPI: Patient is a 89 year old female with history of a wound to her left leg. She most recently underwent excision of left leg wound skin, soft tissue and tendon with placement of myriad and placement of wound VAC with Dr. Ulice Bold on 06/09/2023. Patient is about 1 week out from her procedure. She presents to the clinic today for postoperative follow-up.  Patient was seen in the clinic yesterday.  Wound VAC was not changed as facility had not received the wound VAC yet.  Since yesterday, wound VAC has been delivered to the SNF and patient presents today for her first wound VAC change.  Today, patient presents with her daughter at bedside.  She does not report any significant changes since yesterday.  She states that she brought wound supplies with her.  Review of Systems General: Does not report any significant changes  Physical Exam    06/17/2023   11:14 AM 06/16/2023   12:44 PM 06/11/2023    5:31 PM  Vitals with BMI  Systolic 131 126 469  Diastolic 67 64 72  Pulse 61 62 68    General:  No acute distress,  Alert and oriented, Non-Toxic, Normal speech and affect On exam, patient is sitting upright in no acute distress.  Dressings were taken down including wound VAC.  Sorbact was removed as it was unable to separate from the wound VAC sponge.  To the proximal left lower extremity, wound appears to be improving.  There is healthy tissue throughout the wound.  There is no surrounding erythema or signs of infection.  To the distal wound, there appears to be some unincorporated myriad to the anterior portion of the wound.  Wound otherwise appears to have healthy appearing tissue throughout.  Ulcer to the heel is still present with eschar throughout the entirety of the wound.  Swelling to the  left lower extremity appears to be improved since yesterday.  Assessment/Plan  Wound of left lower extremity, subsequent encounter   The proximal wound was cleaned with Vashe and then dressed with Adaptic, K-Y jelly, gauze and an ABD pad.  The distal wound was covered with Adaptic and K-Y jelly and wound VAC was placed over the top.  Adequate seal was achieved and wound VAC was placed 225 mmHg at continuous suction.  A Mepilex border dressing was then placed over the heel.  Recommended that they continue with wound care to the proximal wound as prescribed.  Recommend that the patient leave the wound VAC on for the next week and we will have her come back for another change.  Patient's daughter expressed understanding.  I did discuss the patient's wound to her heel with Dr. Ulice Bold.  She recommends Medihoney and offloading the wound or facility may continue with wound care that they have already been doing to that heel and/or the same wound care that they are doing to the right heel.  Patient to follow back up next week.  I instructed the daughter to call in the meantime if she has any questions or concerns about anything.  Pictures were obtained of the patient and placed in the chart with the patient's or guardian's permission.   Laura Wilcox 06/17/2023, 1:11 PM

## 2023-06-18 ENCOUNTER — Inpatient Hospital Stay (HOSPITAL_BASED_OUTPATIENT_CLINIC_OR_DEPARTMENT_OTHER): Payer: PRIVATE HEALTH INSURANCE | Admitting: Oncology

## 2023-06-18 ENCOUNTER — Inpatient Hospital Stay (HOSPITAL_BASED_OUTPATIENT_CLINIC_OR_DEPARTMENT_OTHER): Payer: PRIVATE HEALTH INSURANCE | Admitting: Hospice and Palliative Medicine

## 2023-06-18 ENCOUNTER — Encounter: Payer: Self-pay | Admitting: Hematology and Oncology

## 2023-06-18 ENCOUNTER — Inpatient Hospital Stay

## 2023-06-18 ENCOUNTER — Encounter: Payer: Self-pay | Admitting: Oncology

## 2023-06-18 ENCOUNTER — Other Ambulatory Visit (HOSPITAL_COMMUNITY): Payer: Self-pay

## 2023-06-18 ENCOUNTER — Other Ambulatory Visit: Payer: Self-pay

## 2023-06-18 ENCOUNTER — Other Ambulatory Visit: Payer: PRIVATE HEALTH INSURANCE

## 2023-06-18 ENCOUNTER — Ambulatory Visit: Payer: PRIVATE HEALTH INSURANCE | Admitting: Oncology

## 2023-06-18 ENCOUNTER — Inpatient Hospital Stay: Payer: PRIVATE HEALTH INSURANCE

## 2023-06-18 VITALS — BP 113/66 | HR 79 | Temp 97.2°F | Resp 18

## 2023-06-18 DIAGNOSIS — D45 Polycythemia vera: Secondary | ICD-10-CM

## 2023-06-18 DIAGNOSIS — D471 Chronic myeloproliferative disease: Secondary | ICD-10-CM | POA: Diagnosis present

## 2023-06-18 DIAGNOSIS — Z66 Do not resuscitate: Secondary | ICD-10-CM | POA: Diagnosis not present

## 2023-06-18 DIAGNOSIS — Z881 Allergy status to other antibiotic agents status: Secondary | ICD-10-CM | POA: Diagnosis not present

## 2023-06-18 DIAGNOSIS — Z803 Family history of malignant neoplasm of breast: Secondary | ICD-10-CM | POA: Diagnosis not present

## 2023-06-18 DIAGNOSIS — Z90710 Acquired absence of both cervix and uterus: Secondary | ICD-10-CM | POA: Diagnosis not present

## 2023-06-18 DIAGNOSIS — F0392 Unspecified dementia, unspecified severity, with psychotic disturbance: Secondary | ICD-10-CM | POA: Diagnosis not present

## 2023-06-18 DIAGNOSIS — N1832 Chronic kidney disease, stage 3b: Secondary | ICD-10-CM | POA: Diagnosis not present

## 2023-06-18 DIAGNOSIS — D709 Neutropenia, unspecified: Secondary | ICD-10-CM | POA: Diagnosis not present

## 2023-06-18 DIAGNOSIS — D7581 Myelofibrosis: Secondary | ICD-10-CM

## 2023-06-18 DIAGNOSIS — Z87891 Personal history of nicotine dependence: Secondary | ICD-10-CM | POA: Diagnosis not present

## 2023-06-18 DIAGNOSIS — Z87440 Personal history of urinary (tract) infections: Secondary | ICD-10-CM | POA: Diagnosis not present

## 2023-06-18 DIAGNOSIS — Z79899 Other long term (current) drug therapy: Secondary | ICD-10-CM | POA: Diagnosis not present

## 2023-06-18 DIAGNOSIS — D631 Anemia in chronic kidney disease: Secondary | ICD-10-CM

## 2023-06-18 DIAGNOSIS — N1831 Chronic kidney disease, stage 3a: Secondary | ICD-10-CM | POA: Diagnosis not present

## 2023-06-18 DIAGNOSIS — Z8249 Family history of ischemic heart disease and other diseases of the circulatory system: Secondary | ICD-10-CM | POA: Diagnosis not present

## 2023-06-18 DIAGNOSIS — Z818 Family history of other mental and behavioral disorders: Secondary | ICD-10-CM | POA: Diagnosis not present

## 2023-06-18 DIAGNOSIS — Z85828 Personal history of other malignant neoplasm of skin: Secondary | ICD-10-CM | POA: Diagnosis not present

## 2023-06-18 LAB — CBC WITH DIFFERENTIAL (CANCER CENTER ONLY)
Abs Immature Granulocytes: 0.69 10*3/uL — ABNORMAL HIGH (ref 0.00–0.07)
Basophils Absolute: 0.2 10*3/uL — ABNORMAL HIGH (ref 0.0–0.1)
Basophils Relative: 2 %
Eosinophils Absolute: 0.6 10*3/uL — ABNORMAL HIGH (ref 0.0–0.5)
Eosinophils Relative: 5 %
HCT: 31.6 % — ABNORMAL LOW (ref 36.0–46.0)
Hemoglobin: 9.2 g/dL — ABNORMAL LOW (ref 12.0–15.0)
Immature Granulocytes: 6 %
Lymphocytes Relative: 12 %
Lymphs Abs: 1.4 10*3/uL (ref 0.7–4.0)
MCH: 25.4 pg — ABNORMAL LOW (ref 26.0–34.0)
MCHC: 29.1 g/dL — ABNORMAL LOW (ref 30.0–36.0)
MCV: 87.3 fL (ref 80.0–100.0)
Monocytes Absolute: 0.8 10*3/uL (ref 0.1–1.0)
Monocytes Relative: 7 %
Neutro Abs: 8.1 10*3/uL — ABNORMAL HIGH (ref 1.7–7.7)
Neutrophils Relative %: 68 %
Platelet Count: 783 10*3/uL — ABNORMAL HIGH (ref 150–400)
RBC: 3.62 MIL/uL — ABNORMAL LOW (ref 3.87–5.11)
RDW: 24.4 % — ABNORMAL HIGH (ref 11.5–15.5)
Smear Review: INCREASED
WBC Count: 11.8 10*3/uL — ABNORMAL HIGH (ref 4.0–10.5)
nRBC: 0 % (ref 0.0–0.2)

## 2023-06-18 MED ORDER — RUXOLITINIB PHOSPHATE 5 MG PO TABS
5.0000 mg | ORAL_TABLET | ORAL | 1 refills | Status: DC
Start: 2023-06-18 — End: 2023-07-02
  Filled 2023-06-18 (×3): qty 90, 30d supply, fill #0

## 2023-06-18 NOTE — Progress Notes (Signed)
 Palliative Medicine [facility] at [facility] [phone] [phone]   Name: Laura Wilcox Date: 06/18/2023 MRN: [id number]  DOB: March 05, 1927  Patient Care Team: Smiley Houseman, NP as PCP - General (Nurse Practitioner) Mady Haagensen, MD (Internal Medicine) Rickard Patience, MD as Consulting Physician (Oncology)    REASON FOR CONSULTATION: Laura Wilcox is a 89 y.o. female with multiple medical problems including memory impairment/probable dementia and polycythemia vera and secondary myelofibrosis on Jakafi.  Patient was referred to palliative care to help address goals in the context of worsening confusion/memory issues, anxiety, and agitation.  SOCIAL HISTORY:     reports that she has quit smoking. Her smoking use included cigarettes. She has never used smokeless tobacco. She reports that she does not drink alcohol and does not use drugs.  Patient is widowed.  She has a daughter who is her primary caregiver.  She has a son who lives in [place]. Patient as a resident at [facility] but has most recently been at [facility].   ADVANCE DIRECTIVES:  On file  CODE STATUS: DNR  PAST MEDICAL HISTORY: Past Medical History:  Diagnosis Date   Acute metabolic encephalopathy 10/22/2020   Complication of anesthesia    pt gets confused a day after receiving anesthesia per the daughter   Confusion 12/30/2018   Dementia (HCC)    Hypothyroidism    Polycythemia    SCC (squamous cell carcinoma) 04/11/2021   R med dorsum hand - ED&C SCCIS   SCC (squamous cell carcinoma) 10/09/2021   left lateral eye, EDC   SCC (squamous cell carcinoma) 01/28/2022   left lateral eye, recurrent, EDC. 5FU/Calcipotriene cream.   SCC (squamous cell carcinoma) 01/28/2022   in situ, left medial cheek, 5FU/Calcipotriene Cr   SCC (squamous cell carcinoma) 02/26/2022   SCC IS, Right Ulnar Dorsal Hand, 5FU/Calcipotriene cream   SCC (squamous cell  carcinoma) 09/03/2022   R ankle, EDC   SCC (squamous cell carcinoma) 01/20/2023   SCC IS Left medial lower leg, EDC   Squamous cell carcinoma of skin 09/06/2020   R thumb - ED&C   Urinary tract infection without hematuria 01/02/2020   Urticaria 06/02/2017    PAST SURGICAL HISTORY:  Past Surgical History:  Procedure Laterality Date   ABDOMINAL AORTOGRAM W/LOWER EXTREMITY N/A 04/15/2023   Procedure: ABDOMINAL AORTOGRAM W/LOWER EXTREMITY;  Surgeon: Nada Libman, MD;  Location: [facility];  Service: Cardiovascular;  Laterality: N/A;   ABDOMINAL HYSTERECTOMY     complete   APPLICATION OF WOUND VAC Left 04/21/2023   Procedure: APPLICATION OF WOUND VAC;  Surgeon: Peggye Form, DO;  Location: [facility] OR;  Service: Plastics;  Laterality: Left;   APPLICATION OF WOUND VAC Left 04/28/2023   Procedure: WOUND VAC CHANGE;  Surgeon: Peggye Form, DO;  Location: [facility] OR;  Service: Plastics;  Laterality: Left;   APPLICATION OF WOUND VAC N/A 05/05/2023   Procedure: WOUND VAC CHANGE APPLICATION OF SKIN SUBSTITUTE;  Surgeon: Peggye Form, DO;  Location: [facility] OR;  Service: Plastics;  Laterality: N/A;   APPLICATION OF WOUND VAC Left 06/09/2023   Procedure: APPLICATION, WOUND VAC;  Surgeon: Peggye Form, DO;  Location: [facility];  Service: Plastics;  Laterality: Left;   HEMATOMA EVACUATION Left 04/02/2023   Procedure: EVACUATION ANKLE HEMATOMA;  Surgeon: Nada Libman, MD;  Location: [facility] OR[facility];  Service: Vascular;  Laterality: Left;   I & D EXTREMITY Left 06/09/2023   Procedure: irrigation and debridement of  left lower extremity wound with placement of myriad and placement of wound vac;  Surgeon: Peggye Form, DO;  Location: [facility];  Service: Plastics;  Laterality: Left;   INCISION AND DRAINAGE OF WOUND Left 04/21/2023   Procedure: IRRIGATION AND DEBRIDEMENT WOUND;  Surgeon: Peggye Form, DO;  Location: [facility];  Service: Plastics;  Laterality: Left;   PERIPHERAL  VASCULAR BALLOON ANGIOPLASTY  04/15/2023   Procedure: PERIPHERAL VASCULAR BALLOON ANGIOPLASTY;  Surgeon: Nada Libman, MD;  [facility]: [facility];  Service: Cardiovascular;;   PERIPHERAL VASCULAR INTERVENTION  04/15/2023   Procedure: PERIPHERAL VASCULAR INTERVENTION;  Surgeon: Nada Libman, MD;  [facility]: [facility];  Service: Cardiovascular;;    HEMATOLOGY/ONCOLOGY HISTORY:  Oncology History Overview Note  Laura Wilcox is a 89 y.o. female with polycythemia rubra vera and secondary myelofibrosis.  She presented in 04/2002 with thrombocytosis (835,000).  JAK2 V617F was positive on 05/20/2012.   Over the past year, hematocrit has ranged 37.5 - 42.4, hemoglobin 12.1 - 13.4, platelets 458,000 - 569,000, WBC 10,500 - 15,700.  Creatinine has been 1.0 - 1.2.   Bone marrow  on 04/15/2002 revealed a normocellular marrow for age with trilineage hematopoiesis and mild megakaryocytic hyperplasia with focal atypia.  There was no morphologic evidence of a myeloproliferative disorder.  Iron stains were inadequate for evaluation of storage iron.  There was no increase in reticulin fibers.  She has been on agrylin since 2012.   Bone marrow on 09/25/2012 revealed a persistent myeloproliferative disorder s/p Agrylin.  The current features were best classified as persistent myeloproliferative neoplasm (MPN) JAK2 V617F mutation positive.  Marrow was hypercellular for age (30-50%) with increased trilineage hematopoiesis and markedly increased atypical megakaryopoiesis with frequent clustering.  There were no increased blasts.  Iron stores were absent.  There was moderate to severe myelofibrosis (grade MF 2-3).  Differential included ET, PV, primary myelofibrosis, and MPN, unclassifiable.  Cytogenetics were normal (46, XX).   She developed pancytopenia in 12/2015.  Etiology is unclear (medication confusion or hydroxyurea + Agrylin).   She received Procrit 40,000 units (intermittently 09/2012 -  06/2016), Venofer (09/2012 - 10/2012), and Infed (12/2015 - 03/2016).  She received Neupogen during a period of neutropenia. She was anagrelide from 2004 - 08/13/2017.  She began [place] on 08/14/2017.  Jakafi was increased from 5 mg BID to 10 mg BID on 06/17/2018.  Jakafi was increased to 15 mg BID on 08/03/2018.   She began a phlebotomy program on 08/06/2017.  She undergoes small volume (150 cc with replacement of 150 cc NS) if her HCT > 42.   Ferritin has been followed: 529 on 09/03/2017, 1020 on 12/03/2017, 423 on 02/18/2018, and 858 on 11/17/2018.  Iron saturation was 37% and TIBC 270 on 11/17/2018.  B12 was 477 and folate 36 on 11/17/2018.   [facility] from 12/17/2018 - 12/18/2018.  She received 1 unit of PRBCs.  CBC at discharge included a hematocrit of 29.0, hemoglobin 9.4, platelets 364,000, and WBC 10,200.    Polycythemia rubra vera (HCC)  06/02/2017 Initial Diagnosis   Polycythemia rubra vera (HCC)     ALLERGIES:  is allergic to bactrim [sulfamethoxazole-trimethoprim], epinephrine, and other.  MEDICATIONS:  Current Outpatient Medications  Medication Sig Dispense Refill   acetaminophen (TYLENOL) 325 MG tablet Take 650 mg by mouth See admin instructions. Take 650 mg once daily, may take 650 mg dose every 6 hours as needed for mild pain     Amino Acids-Protein Hydrolys (  FEEDING SUPPLEMENT, PRO-STAT SUGAR FREE 64,) LIQD Take by mouth 2 (two) times daily.     ammonium lactate (AMLACTIN) 12 % cream Apply topically qd/bid to dry areas at lower legs and arms (Patient taking differently: Apply 1 Application topically 2 (two) times daily. Apply topically qd/bid to dry areas at lower legs and arms) 385 g 2   ascorbic acid (VITAMIN C) 500 MG tablet Take 500 mg by mouth daily.     aspirin EC 81 MG tablet Take 81 mg by mouth daily. Swallow whole.     calcitRIOL (ROCALTROL) 0.25 MCG capsule Take 0.25 mcg by mouth every Monday, Wednesday, and Friday.     Cholecalciferol (VITAMIN D3) 25  MCG (1000 UT) CAPS Take 1,000 Units by mouth daily.     citalopram (CELEXA) 10 MG tablet Take 10 mg by mouth daily.     clotrimazole-betamethasone (LOTRISONE) cream Apply 1 Application topically 2 (two) times daily.     ferrous sulfate 325 (65 FE) MG EC tablet Take 325 mg by mouth daily.     hydrocortisone 1 % ointment Apply 1 Application topically daily.     hydrocortisone 2.5 % cream Apply topically twice daily to aa of right cheek as needed for itch (Patient taking differently: Apply 1 Application topically every 12 (twelve) hours as needed (itching of right cheek).) 30 g 3   hydrOXYzine (ATARAX) 25 MG tablet Take 25 mg by mouth every 8 (eight) hours as needed for itching. (Patient not taking: Reported on 06/18/2023)     levothyroxine (SYNTHROID) 25 MCG tablet Take 25 mcg by mouth daily.     Multiple Vitamin (MULTIVITAMIN WITH MINERALS) TABS tablet Take 1 tablet by mouth daily.     naloxone (NARCAN) nasal spray 4 mg/0.1 mL Place 1 spray into the nose 3 (three) times daily as needed (opioid overdose). (Patient not taking: Reported on 06/18/2023)     oxyCODONE (OXY IR/ROXICODONE) 5 MG immediate release tablet Take 1 tablet (5 mg total) by mouth every 6 (six) hours as needed for severe pain (pain score 7-10). (Patient not taking: Reported on 06/18/2023) 30 tablet 0   polyethylene glycol (MIRALAX / GLYCOLAX) 17 g packet Take 17 g by mouth daily as needed for mild constipation. (Patient taking differently: Take 17 g by mouth daily.)     ruxolitinib phosphate (JAKAFI) 5 MG tablet Take 1 tablet (5 mg total) by mouth See admin instructions. Take 10 mg by mouth in the morning and 5 mg in the evening 90 tablet 1   traZODone (DESYREL) 50 MG tablet Take 25 mg by mouth at bedtime as needed for sleep.     vitamin B-12 (CYANOCOBALAMIN) 500 MCG tablet Take 1,000 mcg by mouth daily.     No current facility-administered medications for this visit.    VITAL SIGNS: There were no vitals taken for this visit. There  were no vitals filed for this visit.    Estimated body mass index is 21.48 kg/m as calculated from the following:   Height as of 06/09/23: 5' (1.524 m).   Weight as of 06/09/23: 110 lb (49.9 kg).  LABS: CBC:    Component Value Date/Time   WBC 11.8 (H) 06/18/2023 1310   WBC 17.6 (H) 04/30/2023 0906   HGB 9.2 (L) 06/18/2023 1310   HGB 8.1 (L) 06/18/2021 1514   HCT 31.6 (L) 06/18/2023 1310   HCT 25.2 (L) 06/18/2021 1514   PLT 783 (H) 06/18/2023 1310   PLT 452 (H) 06/18/2021 1514   MCV 87.3  06/18/2023 1310   MCV 91 06/18/2021 1514   NEUTROABS 8.1 (H) 06/18/2023 1310   NEUTROABS 6.5 06/18/2021 1514   LYMPHSABS 1.4 06/18/2023 1310   LYMPHSABS 1.0 06/18/2021 1514   MONOABS 0.8 06/18/2023 1310   EOSABS 0.6 (H) 06/18/2023 1310   EOSABS 0.4 06/18/2021 1514   BASOSABS 0.2 (H) 06/18/2023 1310   BASOSABS 0.3 (H) 06/18/2021 1514   Comprehensive Metabolic Panel:    Component Value Date/Time   NA 137 06/05/2023 1326   NA 134 06/18/2021 1514   K 4.5 06/05/2023 1326   CL 101 06/05/2023 1326   CO2 26 06/05/2023 1326   BUN 37 (H) 06/05/2023 1326   BUN 19 06/18/2021 1514   CREATININE 0.93 06/05/2023 1326   GLUCOSE 129 (H) 06/05/2023 1326   CALCIUM 9.2 06/05/2023 1326   AST 15 06/05/2023 1326   ALT 12 06/05/2023 1326   ALKPHOS 99 06/05/2023 1326   BILITOT 0.6 06/05/2023 1326   PROT 6.6 06/05/2023 1326   PROT 5.8 (L) 06/18/2021 1514   ALBUMIN 2.7 (L) 06/05/2023 1326   ALBUMIN 3.6 06/18/2021 1514    RADIOGRAPHIC STUDIES: VAS [place] ABI WITH/WO TBI Result Date: 06/04/2023  LOWER EXTREMITY DOPPLER STUDY Patient Name:  Laura Wilcox  Date of Exam:   06/03/2023 Medical Rec #: [id number]         Accession #:    [id number] Date of Birth: 1927/02/07         Patient Gender: F Patient Age:   16 years Exam Location:  [facility] Procedure:      VAS [place] ABI WITH/WO TBI Referring Phys: Coral Else --------------------------------------------------------------------------------  Other Factors:  Dementia.  Vascular Interventions: 04/15/23 - left superficial femoral artery - popliteal                         artery stent. Limitations: Today's exam was limited due to patient positioning, patient              intolerant to cuff pressure, involuntary patient movement and              bandages. Performing Technologist: Ione Sink Sturdivant-Jones RDMS, RVT  Examination Guidelines: A complete evaluation includes at minimum, Doppler waveform signals and systolic blood pressure reading at the level of bilateral brachial, anterior tibial, and posterior tibial arteries, when vessel segments are accessible. Bilateral testing is considered an integral part of a complete examination. Photoelectric Plethysmograph (PPG) waveforms and toe systolic pressure readings are included as required and additional duplex testing as needed. Limited examinations for reoccurring indications may be performed as noted.  ABI Findings: +---------+------------------+-----+-----------------+--------+ Right    Rt Pressure (mmHg)IndexWaveform         Comment  +---------+------------------+-----+-----------------+--------+ Brachial 128                    triphasic                 +---------+------------------+-----+-----------------+--------+ PTA      56                0.44 monophasic                +---------+------------------+-----+-----------------+--------+ DP       87                0.68 monophasic                +---------+------------------+-----+-----------------+--------+ Great Toe  severely dampened         +---------+------------------+-----+-----------------+--------+ +---------+------------------+-----+-----------------+--------------------+ Left     Lt Pressure (mmHg)IndexWaveform         Comment              +---------+------------------+-----+-----------------+--------------------+ Brachial 78                                      greater than 20 mmHg  +---------+------------------+-----+-----------------+--------------------+ PTA      79                0.62 monophasic                            +---------+------------------+-----+-----------------+--------------------+ DP                                               Not obtainable       +---------+------------------+-----+-----------------+--------------------+ Great Toe                       severely dampened                     +---------+------------------+-----+-----------------+--------------------+ +-------+-----------+-----------+------------+------------+ ABI/TBIToday's ABIToday's TBIPrevious ABIPrevious TBI +-------+-----------+-----------+------------+------------+ Right  0.68                                           +-------+-----------+-----------+------------+------------+ Left   0.62                                           +-------+-----------+-----------+------------+------------+  Suboptimal study due to patient's condition.  Summary: Right: Resting right ankle-brachial index indicates moderate right lower extremity arterial disease. Left: Resting left ankle-brachial index indicates moderate left lower extremity arterial disease. *See table(s) above for measurements and observations.  Electronically signed by Sherald Hess MD on 06/04/2023 at 7:26:30 AM.    Final    VAS [place] LOWER EXTREMITY ARTERIAL DUPLEX Result Date: 06/04/2023 LOWER EXTREMITY ARTERIAL DUPLEX STUDY Patient Name:  Laura Wilcox  Date of Exam:   06/03/2023 Medical Rec #: [id number]         Accession #:    [id number] Date of Birth: 05-Feb-1927         Patient Gender: F Patient Age:   11 years Exam Location:  [facility] Procedure:      VAS [place] LOWER EXTREMITY ARTERIAL DUPLEX Referring Phys: Coral Else --------------------------------------------------------------------------------  Indications: Peripheral artery disease.  Vascular Interventions: Left SFA-PopA stenting on 04/15/23.  Current ABI:            Rt 0.68, Lt 0.62 Limitations: Patient positioning, intolerant to cuff pressure, involuntary              patient movement and bandages. Performing Technologist: Whitakers Sink Sturdivant-Jones RDMS, RVT  Examination Guidelines: A complete evaluation includes B-mode imaging, spectral Doppler, color Doppler, and power Doppler as needed of all accessible portions of each vessel. Bilateral testing is considered an integral part of a complete examination. Limited examinations for reoccurring indications may be performed as noted.   +----------+--------+-----+--------+----------+------------------+  LEFT      PSV cm/sRatioStenosisWaveform  Comments           +----------+--------+-----+--------+----------+------------------+ CFA Distal137                  biphasic                     +----------+--------+-----+--------+----------+------------------+ DFA       51                   monophasic                   +----------+--------+-----+--------+----------+------------------+ SFA Prox                                 Stent              +----------+--------+-----+--------+----------+------------------+ SFA Mid                                  Stent              +----------+--------+-----+--------+----------+------------------+ SFA Distal                               Stent              +----------+--------+-----+--------+----------+------------------+ POP Prox  78                   monophasic                   +----------+--------+-----+--------+----------+------------------+ POP Distal40                   monophasic                   +----------+--------+-----+--------+----------+------------------+ TP Trunk  62                   monophasic                   +----------+--------+-----+--------+----------+------------------+ ATA Prox                                 Not imaged/bandage  +----------+--------+-----+--------+----------+------------------+ ATA Mid   58                   monophasic                   +----------+--------+-----+--------+----------+------------------+ ATA Distal22                   monophasic                   +----------+--------+-----+--------+----------+------------------+ PTA Distal39                   monophasic                   +----------+--------+-----+--------+----------+------------------+ PERO Prox                                Not visualized     +----------+--------+-----+--------+----------+------------------+  Left Stent(s): +---------------+--------+--------+----------+--------+ SFA - POP A    PSV cm/sStenosisWaveform  Comments +---------------+--------+--------+----------+--------+ Prox to Stent  101                                +---------------+--------+--------+----------+--------+  Proximal Stent 60              monophasic         +---------------+--------+--------+----------+--------+ Mid Stent      58              monophasic         +---------------+--------+--------+----------+--------+ Distal Stent   61              monophasic         +---------------+--------+--------+----------+--------+ Distal to Stent37              monophasic         +---------------+--------+--------+----------+--------+    Summary: Left: Patent left superficial femoral-popliteal artery stent.   See table(s) above for measurements and observations. Electronically signed by Sherald Hess MD on 06/04/2023 at 7:25:12 AM.    Final     PERFORMANCE STATUS (ECOG) : 2 - Symptomatic, <50% confined to bed  Review of Systems Unless otherwise noted, a complete review of systems is negative.  Physical Exam General: NAD Pulmonary: Unlabored Extremities: no edema, no joint deformities Skin: no rashes Neurological: Weakness, baseline confusion  IMPRESSION: Patient slept most of the visit and was unable to  participate meaningfully in a conversation regarding goals.  I met with daughter regarding patient's recent hospitalizations, wound care, and subsequent stay at rehab.  Family is weighing quality of life with medical decision making.  They have previously considered option of hospice, which we discussed in detail again today.  We also discussed option of palliative care following patient at SNF.  Daughter is trying to navigate resources and potential transition back to ALF vs LTC.  Will refer to social work to help explore available resources.  PLAN: -Continue current scope of treatment -Referrals to social work and nutrition -Follow-up 1 month  Patient expressed understanding and was in agreement with this plan. She also understands that She can call the clinic at any time with any questions, concerns, or complaints.     Time Total: 45 minutes  Visit consisted of counseling and education dealing with the complex and emotionally intense issues of symptom management and palliative care in the setting of serious and potentially life-threatening illness.Greater than 50%  of this time was spent counseling and coordinating care related to the above assessment and plan.  Signed by: Laurette Schimke, PhD, NP-C

## 2023-06-18 NOTE — Assessment & Plan Note (Addendum)
 Polycythemia vera and  secondary myelofibrosis. Labs are reviewed and discussed with patient. Patient has resumed Jakafi 10:20 AM and 5 mg in p.m. Platelet count has improved to 700s.   Continue aspirin 81 mg daily. Family is weighing quality of life with medical decision making.  follow up with palliative care service for discussion of goals of care.

## 2023-06-18 NOTE — Progress Notes (Signed)
 Hematology/Oncology Progress note [phone] [phone]) [phone]     ASSESSMENT & PLAN:   Myelofibrosis (HCC) Polycythemia vera and  secondary myelofibrosis. Labs are reviewed and discussed with patient. Patient has resumed Jakafi 10:20 AM and 5 mg in p.m. Platelet count has improved to 700s.   Continue aspirin 81 mg daily. Family is weighing quality of life with medical decision making.  follow up with palliative care service for discussion of goals of care.   Anemia in chronic kidney disease Anemia due to CKD, and myelofibrosis.   Lab Results  Component Value Date   HGB 9.2 (L) 06/18/2023   TIBC 181 (L) 06/05/2023   IRONPCTSAT 13 06/05/2023   FERRITIN 573 (H) 06/05/2023    Adequate iron store.  Hemoglobin stable.  Orders Placed This Encounter  Procedures   CBC with Differential (Cancer Center Only)    Standing Status:   Future    Expected Date:   07/16/2023    Expiration Date:   06/17/2024   CMP (Cancer Center only)    Standing Status:   Future    Expected Date:   07/16/2023    Expiration Date:   06/17/2024   Follow up  4 weeks.   All questions were answered. The patient knows to call the clinic with any problems, questions or concerns.  Rickard Patience, MD, PhD [facility] Hematology Oncology 06/18/2023      Chief Complaint: Laura Wilcox is a 89 y.o. female with polycythemia vera (PV) and secondary myelofibrosis presents for follow up   PERTINENT ONCOLOGY HISTORY Laura Wilcox is a 89 y.o.afemale who has above oncology history reviewed by me today presented for follow up visit for management of  polycythemia vera  Patient previously followed up by Dr.Corcoran, patient switched care to me on 11/16/20 Extensive medical record review was performed by me  Feb 2014 polycythemia vera and secondary myelofibrosis.  She presented in 04/2002 with thrombocytosis (835,000).  JAK2 V617F was positive   Bone marrow was on 04/15/2002 revealed a normocellular  marrow for age with trilineage hematopoiesis and mild megakaryocytic hyperplasia with focal atypia.  There was no morphologic evidence of a myeloproliferative disorder.  Iron stains were inadequate for evaluation of storage iron.  There was no increase in reticulin fibers.  She was started on Agrylin in 2012  Bone marrow on 09/25/2012 revealed persistent myeloproliferative disorder s/p Agrylin.  The current features were best classified as persistent myeloproliferative neoplasm (MPN) JAK2 V617F mutation positive.  The current features of markedly increased atypical megakaryocytes paresis, with frequent clustering with frequent hyper/deeply lobulated forms, bizarre forms and forms with staghorn nuclei, hyperchromatic forms and abundant cytoplasm was highly suggestive of a myeloproliferative neoplasm.  Marrow was hypercellular for age (30-50%) with increased trilineage hematopoiesis and markedly increased atypical megakaryopoiesis with frequent clustering.  There were no increased blasts.  Iron stores were absent.  There was moderate to severe myelofibrosis (grade MF 2-3).  Differential includes ET, PV, primary myelofibrosis, and MPN, unclassifiable.  Cytogenetics were normal (46, XX). She developed pancytopenia in 12/2015.  Etiology is unclear (medication confusion or hydroxyurea + Agrylin).   She received Procrit 40,000 units (intermittently 09/2012 - 06/2016), Venofer (09/2012 - 10/2012), and Infed (12/2015 - 03/2016).  She received Neupogen during a period of neutropenia.   She was anagrelide from 2004 - 08/13/2017.  She began [place] on 08/14/2017.  Jakafi was increased from 5 mg BID to 10 mg BID on 06/17/2018.  Jakafi was increased to 15 mg BID on 08/03/2018.  She  has been back on Jakafi 5 mg BID since 02/01/2019 then 10 mg BID on 05/25/2019.  Earvin Hansen was on hold from 12/20/2019 - 01/12/2020.  Jakafi 5 mg twice daily restarted on 01/12/2020 then increased to 10 mg in the morning and 5 mg in the evening on  02/02/2020. Earvin Hansen is currently 20 mg po BID.  She has received Retacrit to maintain a hemoglobin > 10 (12/24/2019 and 03   Squamous cell carcinoma left cheek- s/p cryotherapy and debridement. Managed by Dr. Gwen Pounds  10/22/2020- 10/26/2020 Hospitalization due to acute metabolic encephalopathy, UTI,E. coli bacteremia AKI on CKD, acute on chronic anemia. Earvin Hansen was held during the admission. She was discharged and spent a short period of time in rehab. [facility] [place] was held.   11/06/2020 seen by symptom management for weakness.  11/16/2020 established care with me. Resumed Jakafi at 10mg  BID  #Dementia/cognitive impairment- patient has hx of dementia. Per daughter, it has been difficult to convince patent to see neurology for further evaluation. She is not on any medication for her memory loss.  She has been referred to home health.  06/25/2021 - 07/02/2021,Patient was hospitalized due to UTI, generalized weakness Jakafi was.  Held. 07/25/2021, resumed on Jakafi 5 mg daily.  07/25/2021, bilateral lower extremity ultrasound were negative for DVT. 08/20/2021, increased Jakafi to 5 mg twice daily.   she has had multiple hospitalization due to nonhealing lower extremity wound status post multiple I&D and debridement.  Patient has been off Jakafi treatment since the end of December. Per daughter, patient has underwent multiple procedures, patient's lower extremity wound is healing slowly.  INTERVAL HISTORY Laura Wilcox is a 89 y.o. female who has above history reviewed by me today presents for follow up visit for management of polycythemia vera and secondary myelofibrosis.   Patient was accompanied by daughter [name]  Patient is a poor historian due to dementia. Reviewed medication list provided by facility.  Patient has resumed on Jakafi 10 mg in a.m. and 5 mg in p.m.  So far patient appears tolerating well.    Past Medical History:  Diagnosis Date   Acute metabolic encephalopathy 10/22/2020    Complication of anesthesia    pt gets confused a day after receiving anesthesia per the daughter   Confusion 12/30/2018   Dementia (HCC)    Hypothyroidism    Polycythemia    SCC (squamous cell carcinoma) 04/11/2021   R med dorsum hand - ED&C SCCIS   SCC (squamous cell carcinoma) 10/09/2021   left lateral eye, EDC   SCC (squamous cell carcinoma) 01/28/2022   left lateral eye, recurrent, EDC. 5FU/Calcipotriene cream.   SCC (squamous cell carcinoma) 01/28/2022   in situ, left medial cheek, 5FU/Calcipotriene Cr   SCC (squamous cell carcinoma) 02/26/2022   SCC IS, Right Ulnar Dorsal Hand, 5FU/Calcipotriene cream   SCC (squamous cell carcinoma) 09/03/2022   R ankle, EDC   SCC (squamous cell carcinoma) 01/20/2023   SCC IS Left medial lower leg, EDC   Squamous cell carcinoma of skin 09/06/2020   R thumb - ED&C   Urinary tract infection without hematuria 01/02/2020   Urticaria 06/02/2017    Past Surgical History:  Procedure Laterality Date   ABDOMINAL AORTOGRAM W/LOWER EXTREMITY N/A 04/15/2023   Procedure: ABDOMINAL AORTOGRAM W/LOWER EXTREMITY;  Surgeon: Nada Libman, MD;  Location: [facility];  Service: Cardiovascular;  Laterality: N/A;   ABDOMINAL HYSTERECTOMY     complete   APPLICATION OF WOUND VAC Left 04/21/2023   Procedure: APPLICATION OF WOUND VAC;  Surgeon: Peggye Form, DO;  Location: [facility] OR;  Service: Plastics;  Laterality: Left;   APPLICATION OF WOUND VAC Left 04/28/2023   Procedure: WOUND VAC CHANGE;  Surgeon: Peggye Form, DO;  Location: [facility];  Service: Plastics;  Laterality: Left;   APPLICATION OF WOUND VAC N/A 05/05/2023   Procedure: WOUND VAC CHANGE APPLICATION OF SKIN SUBSTITUTE;  Surgeon: Peggye Form, DO;  Location: [facility];  Service: Plastics;  Laterality: N/A;   APPLICATION OF WOUND VAC Left 06/09/2023   Procedure: APPLICATION, WOUND VAC;  Surgeon: Peggye Form, DO;  Location: [facility];  Service: Plastics;  Laterality: Left;    HEMATOMA EVACUATION Left 04/02/2023   Procedure: EVACUATION ANKLE HEMATOMA;  Surgeon: Nada Libman, MD;  Location: [facility] ORS;  Service: Vascular;  Laterality: Left;   I & D EXTREMITY Left 06/09/2023   Procedure: irrigation and debridement of left lower extremity wound with placement of myriad and placement of wound vac;  Surgeon: Peggye Form, DO;  Location: [facility];  Service: Plastics;  Laterality: Left;   INCISION AND DRAINAGE OF WOUND Left 04/21/2023   Procedure: IRRIGATION AND DEBRIDEMENT WOUND;  Surgeon: Peggye Form, DO;  Location: [facility];  Service: Plastics;  Laterality: Left;   PERIPHERAL VASCULAR BALLOON ANGIOPLASTY  04/15/2023   Procedure: PERIPHERAL VASCULAR BALLOON ANGIOPLASTY;  Surgeon: Nada Libman, MD;  [facility]: [facility];  Service: Cardiovascular;;   PERIPHERAL VASCULAR INTERVENTION  04/15/2023   Procedure: PERIPHERAL VASCULAR INTERVENTION;  Surgeon: Nada Libman, MD;  [facility]: [facility];  Service: Cardiovascular;;    Family History  Problem Relation Age of Onset   Heart disease Father    Cancer Sister        breast   Cancer Sister        breast   Alzheimer's disease Sister     Social History:  reports that she has quit smoking. Her smoking use included cigarettes. She has never used smokeless tobacco. She reports that she does not drink alcohol and does not use drugs.   Allergies:  Allergies  Allergen Reactions   Bactrim [Sulfamethoxazole-Trimethoprim]     Due to kidney disease    Epinephrine Other (See Comments)   Other     Per daughter RUE BP significantly higher than LUE    Current Medications: Current Outpatient Medications  Medication Sig Dispense Refill   acetaminophen (TYLENOL) 325 MG tablet Take 650 mg by mouth See admin instructions. Take 650 mg once daily, may take 650 mg dose every 6 hours as needed for mild pain     Amino Acids-Protein Hydrolys (FEEDING SUPPLEMENT, PRO-STAT SUGAR FREE 64,) LIQD Take by mouth  2 (two) times daily.     ammonium lactate (AMLACTIN) 12 % cream Apply topically qd/bid to dry areas at lower legs and arms (Patient taking differently: Apply 1 Application topically 2 (two) times daily. Apply topically qd/bid to dry areas at lower legs and arms) 385 g 2   ascorbic acid (VITAMIN C) 500 MG tablet Take 500 mg by mouth daily.     aspirin EC 81 MG tablet Take 81 mg by mouth daily. Swallow whole.     calcitRIOL (ROCALTROL) 0.25 MCG capsule Take 0.25 mcg by mouth every Monday, Wednesday, and Friday.     Cholecalciferol (VITAMIN D3) 25 MCG (1000 UT) CAPS Take 1,000 Units by mouth daily.     citalopram (CELEXA) 10 MG tablet Take 10 mg by mouth daily.     clotrimazole-betamethasone (  LOTRISONE) cream Apply 1 Application topically 2 (two) times daily.     ferrous sulfate 325 (65 FE) MG EC tablet Take 325 mg by mouth daily.     hydrocortisone 1 % ointment Apply 1 Application topically daily.     hydrocortisone 2.5 % cream Apply topically twice daily to aa of right cheek as needed for itch (Patient taking differently: Apply 1 Application topically every 12 (twelve) hours as needed (itching of right cheek).) 30 g 3   levothyroxine (SYNTHROID) 25 MCG tablet Take 25 mcg by mouth daily.     Multiple Vitamin (MULTIVITAMIN WITH MINERALS) TABS tablet Take 1 tablet by mouth daily.     polyethylene glycol (MIRALAX / GLYCOLAX) 17 g packet Take 17 g by mouth daily as needed for mild constipation. (Patient taking differently: Take 17 g by mouth daily.)     traZODone (DESYREL) 50 MG tablet Take 25 mg by mouth at bedtime as needed for sleep.     vitamin B-12 (CYANOCOBALAMIN) 500 MCG tablet Take 1,000 mcg by mouth daily.     hydrOXYzine (ATARAX) 25 MG tablet Take 25 mg by mouth every 8 (eight) hours as needed for itching. (Patient not taking: Reported on 06/18/2023)     naloxone [facility]) nasal spray 4 mg/0.1 mL Place 1 spray into the nose 3 (three) times daily as needed (opioid overdose). (Patient not taking:  Reported on 06/18/2023)     oxyCODONE (OXY IR/ROXICODONE) 5 MG immediate release tablet Take 1 tablet (5 mg total) by mouth every 6 (six) hours as needed for severe pain (pain score 7-10). (Patient not taking: Reported on 06/18/2023) 30 tablet 0   ruxolitinib phosphate (JAKAFI) 5 MG tablet Take 10 mg by mouth in the morning and 5 mg in the evening 90 tablet 1   No current facility-administered medications for this visit.    Review of Systems  Unable to perform ROS: Dementia    Performance status (ECOG):  2  Vitals Blood pressure 113/66, pulse 79, temperature (!) 97.2 F (36.2 C), resp. rate 18, SpO2 100%.   Physical Exam Vitals reviewed.  Constitutional:      General: She is not in acute distress.    Appearance: She is well-developed. She is ill-appearing.     Comments: Frail appearance, patient sits in the wheelchair  Eyes:     General: No scleral icterus. Cardiovascular:     Rate and Rhythm: Normal rate.  Pulmonary:     Effort: Pulmonary effort is normal. No respiratory distress.  Abdominal:     General: There is no distension.     Palpations: Abdomen is soft.  Musculoskeletal:        General: No deformity. Normal range of motion.  Skin:    Coloration: Skin is not pale.     Comments: Nonhealing lower extremity wound  Neurological:     Mental Status: She is alert. Mental status is at baseline.  Psychiatric:        Attention and Perception: Attention normal.        Mood and Affect: Affect normal.        Behavior: Behavior is cooperative.        Cognition and Memory: Cognition is impaired. Memory is impaired.    Labs    Latest Ref Rng & Units 06/18/2023    1:10 PM 06/05/2023    1:28 PM 04/30/2023    9:06 AM  CBC  WBC 4.0 - 10.5 K/uL 11.8  17.3  17.6   Hemoglobin 12.0 -  15.0 g/dL 9.2  9.7  9.2   Hematocrit 36.0 - 46.0 % 31.6  32.2  30.2   Platelets 150 - 400 K/uL 783  1,036  666       Latest Ref Rng & Units 06/05/2023    1:26 PM 04/30/2023    9:06 AM 04/29/2023    11:58 AM  CMP  Glucose 70 - 99 mg/dL 829  562  130   BUN 8 - 23 mg/dL 37  23  23   Creatinine 0.44 - 1.00 mg/dL 8.65  7.84  6.96   Sodium 135 - 145 mmol/L 137  133  133   Potassium 3.5 - 5.1 mmol/L 4.5  3.8  4.7   Chloride 98 - 111 mmol/L 101  95  96   CO2 22 - 32 mmol/L 26  25  25    Calcium 8.9 - 10.3 mg/dL 9.2  9.3  9.4   Total Protein 6.5 - 8.1 g/dL 6.6     Total Bilirubin 0.0 - 1.2 mg/dL 0.6     Alkaline Phos 38 - 126 U/L 99     AST 15 - 41 U/L 15     ALT 0 - 44 U/L 12

## 2023-06-18 NOTE — Assessment & Plan Note (Signed)
 Anemia due to CKD, and myelofibrosis.   Lab Results  Component Value Date   HGB 9.2 (L) 06/18/2023   TIBC 181 (L) 06/05/2023   IRONPCTSAT 13 06/05/2023   FERRITIN 573 (H) 06/05/2023    Adequate iron store.  Hemoglobin stable.

## 2023-06-18 NOTE — Progress Notes (Signed)
 Specialty Pharmacy Refill Coordination Note  Laura Wilcox is a 89 y.o. female contacted today regarding refills of specialty medication(s) Ruxolitinib Phosphate (JAKAFI)   Patient requested Delivery   Delivery date: 06/20/23   Verified address: [address]   [place] [address]   Medication will be filled on 06/19/23.

## 2023-06-18 NOTE — Progress Notes (Signed)
 [facility] Clinical Social Work  Clinical Social Work was referred by medical provider, [employer], for assessment of psychosocial needs.  Clinical Social Worker attempted to contact caregiver to offer support and assess for needs.    CSW spoke with patient's daughter, [name], who was currently with patient at the SNF, [facility].  She stated she is interested in moving her mother to another facility, but was unable to speak freely at the moment.  Scheduled a phone meeting tomorrow at 1pm.  [name] is attending a care plan meeting at the facility tomorrow and 10am and will possibly have some more information to share at that time.     [name], LCSW  Clinical Social Worker [facility]

## 2023-06-19 ENCOUNTER — Other Ambulatory Visit (HOSPITAL_COMMUNITY): Payer: Self-pay

## 2023-06-19 NOTE — Progress Notes (Signed)
 [facility] CSW Progress Note  Visual merchandiser received a message from scheduling that patient's daughter, [name], would not be able to attend the phone meeting today at 1:00pm due to flu-like symptoms.  She stated she would call back in a few days to reschedule.   [name], LCSW Clinical Social Worker [facility]

## 2023-06-19 NOTE — Progress Notes (Signed)
 [facility] CSW Progress Note  Visual merchandiser received a message from scheduling that patient's daughter, Laura Wilcox, will not be able to attend the phone meeting today at 1pm.  She stated she would call back in a few days.    [name], LCSW Clinical Social Worker [facility]

## 2023-06-23 ENCOUNTER — Emergency Department

## 2023-06-23 ENCOUNTER — Inpatient Hospital Stay
Admission: EM | Admit: 2023-06-23 | Discharge: 2023-07-02 | DRG: 602 | Disposition: A | Discharge status: Hospice | Source: Skilled Nursing Facility | Attending: Family Medicine | Admitting: Family Medicine

## 2023-06-23 ENCOUNTER — Other Ambulatory Visit: Payer: Self-pay

## 2023-06-23 DIAGNOSIS — S81802A Unspecified open wound, left lower leg, initial encounter: Secondary | ICD-10-CM | POA: Diagnosis present

## 2023-06-23 DIAGNOSIS — Z90710 Acquired absence of both cervix and uterus: Secondary | ICD-10-CM

## 2023-06-23 DIAGNOSIS — Z888 Allergy status to other drugs, medicaments and biological substances status: Secondary | ICD-10-CM

## 2023-06-23 DIAGNOSIS — S81802D Unspecified open wound, left lower leg, subsequent encounter: Secondary | ICD-10-CM | POA: Diagnosis not present

## 2023-06-23 DIAGNOSIS — R7881 Bacteremia: Secondary | ICD-10-CM | POA: Diagnosis present

## 2023-06-23 DIAGNOSIS — R4182 Altered mental status, unspecified: Principal | ICD-10-CM

## 2023-06-23 DIAGNOSIS — N390 Urinary tract infection, site not specified: Secondary | ICD-10-CM | POA: Diagnosis present

## 2023-06-23 DIAGNOSIS — E039 Hypothyroidism, unspecified: Secondary | ICD-10-CM | POA: Diagnosis not present

## 2023-06-23 DIAGNOSIS — N1832 Chronic kidney disease, stage 3b: Secondary | ICD-10-CM | POA: Diagnosis not present

## 2023-06-23 DIAGNOSIS — E162 Hypoglycemia, unspecified: Secondary | ICD-10-CM | POA: Diagnosis present

## 2023-06-23 DIAGNOSIS — Z85828 Personal history of other malignant neoplasm of skin: Secondary | ICD-10-CM

## 2023-06-23 DIAGNOSIS — D7581 Myelofibrosis: Secondary | ICD-10-CM | POA: Diagnosis present

## 2023-06-23 DIAGNOSIS — D631 Anemia in chronic kidney disease: Secondary | ICD-10-CM | POA: Diagnosis present

## 2023-06-23 DIAGNOSIS — L89626 Pressure-induced deep tissue damage of left heel: Secondary | ICD-10-CM | POA: Diagnosis present

## 2023-06-23 DIAGNOSIS — Z87891 Personal history of nicotine dependence: Secondary | ICD-10-CM

## 2023-06-23 DIAGNOSIS — L89616 Pressure-induced deep tissue damage of right heel: Secondary | ICD-10-CM | POA: Diagnosis present

## 2023-06-23 DIAGNOSIS — H9190 Unspecified hearing loss, unspecified ear: Secondary | ICD-10-CM | POA: Diagnosis present

## 2023-06-23 DIAGNOSIS — N3000 Acute cystitis without hematuria: Secondary | ICD-10-CM

## 2023-06-23 DIAGNOSIS — Z6841 Body Mass Index (BMI) 40.0 and over, adult: Secondary | ICD-10-CM

## 2023-06-23 DIAGNOSIS — E440 Moderate protein-calorie malnutrition: Secondary | ICD-10-CM | POA: Diagnosis present

## 2023-06-23 DIAGNOSIS — F418 Other specified anxiety disorders: Secondary | ICD-10-CM | POA: Diagnosis present

## 2023-06-23 DIAGNOSIS — Z79890 Hormone replacement therapy (postmenopausal): Secondary | ICD-10-CM

## 2023-06-23 DIAGNOSIS — S8012XA Contusion of left lower leg, initial encounter: Secondary | ICD-10-CM | POA: Diagnosis present

## 2023-06-23 DIAGNOSIS — K449 Diaphragmatic hernia without obstruction or gangrene: Secondary | ICD-10-CM | POA: Diagnosis present

## 2023-06-23 DIAGNOSIS — Z820 Family history of epilepsy and other diseases of the nervous system: Secondary | ICD-10-CM

## 2023-06-23 DIAGNOSIS — F32A Depression, unspecified: Secondary | ICD-10-CM | POA: Diagnosis present

## 2023-06-23 DIAGNOSIS — Z7982 Long term (current) use of aspirin: Secondary | ICD-10-CM

## 2023-06-23 DIAGNOSIS — J9601 Acute respiratory failure with hypoxia: Secondary | ICD-10-CM | POA: Diagnosis present

## 2023-06-23 DIAGNOSIS — N189 Chronic kidney disease, unspecified: Secondary | ICD-10-CM | POA: Diagnosis present

## 2023-06-23 DIAGNOSIS — Z8701 Personal history of pneumonia (recurrent): Secondary | ICD-10-CM

## 2023-06-23 DIAGNOSIS — B961 Klebsiella pneumoniae [K. pneumoniae] as the cause of diseases classified elsewhere: Secondary | ICD-10-CM | POA: Diagnosis present

## 2023-06-23 DIAGNOSIS — B9689 Other specified bacterial agents as the cause of diseases classified elsewhere: Secondary | ICD-10-CM | POA: Diagnosis present

## 2023-06-23 DIAGNOSIS — L089 Local infection of the skin and subcutaneous tissue, unspecified: Principal | ICD-10-CM | POA: Diagnosis present

## 2023-06-23 DIAGNOSIS — Z79899 Other long term (current) drug therapy: Secondary | ICD-10-CM

## 2023-06-23 DIAGNOSIS — Z1152 Encounter for screening for COVID-19: Secondary | ICD-10-CM

## 2023-06-23 DIAGNOSIS — Z66 Do not resuscitate: Secondary | ICD-10-CM | POA: Diagnosis present

## 2023-06-23 DIAGNOSIS — Z8249 Family history of ischemic heart disease and other diseases of the circulatory system: Secondary | ICD-10-CM

## 2023-06-23 DIAGNOSIS — F0393 Unspecified dementia, unspecified severity, with mood disturbance: Secondary | ICD-10-CM | POA: Diagnosis present

## 2023-06-23 DIAGNOSIS — B957 Other staphylococcus as the cause of diseases classified elsewhere: Secondary | ICD-10-CM | POA: Diagnosis present

## 2023-06-23 DIAGNOSIS — Z881 Allergy status to other antibiotic agents status: Secondary | ICD-10-CM

## 2023-06-23 DIAGNOSIS — J439 Emphysema, unspecified: Secondary | ICD-10-CM | POA: Diagnosis present

## 2023-06-23 DIAGNOSIS — X58XXXA Exposure to other specified factors, initial encounter: Secondary | ICD-10-CM | POA: Diagnosis present

## 2023-06-23 DIAGNOSIS — L89102 Pressure ulcer of unspecified part of back, stage 2: Secondary | ICD-10-CM | POA: Diagnosis present

## 2023-06-23 DIAGNOSIS — Z882 Allergy status to sulfonamides status: Secondary | ICD-10-CM

## 2023-06-23 DIAGNOSIS — F0390 Unspecified dementia without behavioral disturbance: Secondary | ICD-10-CM | POA: Diagnosis present

## 2023-06-23 DIAGNOSIS — D45 Polycythemia vera: Secondary | ICD-10-CM | POA: Diagnosis present

## 2023-06-23 DIAGNOSIS — Z515 Encounter for palliative care: Secondary | ICD-10-CM

## 2023-06-23 DIAGNOSIS — Z1624 Resistance to multiple antibiotics: Secondary | ICD-10-CM | POA: Diagnosis present

## 2023-06-23 DIAGNOSIS — F0392 Unspecified dementia, unspecified severity, with psychotic disturbance: Secondary | ICD-10-CM | POA: Diagnosis present

## 2023-06-23 DIAGNOSIS — F0394 Unspecified dementia, unspecified severity, with anxiety: Secondary | ICD-10-CM | POA: Diagnosis present

## 2023-06-23 DIAGNOSIS — G9341 Metabolic encephalopathy: Secondary | ICD-10-CM | POA: Diagnosis present

## 2023-06-23 DIAGNOSIS — L03116 Cellulitis of left lower limb: Secondary | ICD-10-CM | POA: Diagnosis present

## 2023-06-23 LAB — COMPREHENSIVE METABOLIC PANEL
ALT: 10 U/L (ref 0–44)
AST: 23 U/L (ref 15–41)
Albumin: 2.9 g/dL — ABNORMAL LOW (ref 3.5–5.0)
Alkaline Phosphatase: 65 U/L (ref 38–126)
Anion gap: 10 (ref 5–15)
BUN: 25 mg/dL — ABNORMAL HIGH (ref 8–23)
CO2: 23 mmol/L (ref 22–32)
Calcium: 9.1 mg/dL (ref 8.9–10.3)
Chloride: 103 mmol/L (ref 98–111)
Creatinine, Ser: 0.9 mg/dL (ref 0.44–1.00)
GFR, Estimated: 59 mL/min — ABNORMAL LOW (ref >60)
Glucose, Bld: 88 mg/dL (ref 70–99)
Potassium: 4.6 mmol/L (ref 3.5–5.1)
Sodium: 136 mmol/L (ref 135–145)
Total Bilirubin: 0.9 mg/dL (ref 0.0–1.2)
Total Protein: 5.9 g/dL — ABNORMAL LOW (ref 6.5–8.1)

## 2023-06-23 LAB — URINALYSIS, W/ REFLEX TO CULTURE (INFECTION SUSPECTED)
Bilirubin Urine: NEGATIVE
Glucose, UA: NEGATIVE mg/dL
Hgb urine dipstick: NEGATIVE
Ketones, ur: 5 mg/dL — AB
Nitrite: POSITIVE — AB
Protein, ur: NEGATIVE mg/dL
Specific Gravity, Urine: 1.016 (ref 1.005–1.030)
pH: 5 (ref 5.0–8.0)

## 2023-06-23 LAB — RESP PANEL BY RT-PCR (RSV, FLU A&B, COVID)  RVPGX2
Influenza A by PCR: NEGATIVE
Influenza B by PCR: NEGATIVE
Resp Syncytial Virus by PCR: NEGATIVE
SARS Coronavirus 2 by RT PCR: NEGATIVE

## 2023-06-23 LAB — CBC WITH DIFFERENTIAL/PLATELET
Abs Immature Granulocytes: 0.46 10*3/uL — ABNORMAL HIGH (ref 0.00–0.07)
Basophils Absolute: 0.2 10*3/uL — ABNORMAL HIGH (ref 0.0–0.1)
Basophils Relative: 1 %
Eosinophils Absolute: 0.7 10*3/uL — ABNORMAL HIGH (ref 0.0–0.5)
Eosinophils Relative: 5 %
HCT: 32.1 % — ABNORMAL LOW (ref 36.0–46.0)
Hemoglobin: 9.6 g/dL — ABNORMAL LOW (ref 12.0–15.0)
Immature Granulocytes: 3 %
Lymphocytes Relative: 12 %
Lymphs Abs: 1.6 10*3/uL (ref 0.7–4.0)
MCH: 26.2 pg (ref 26.0–34.0)
MCHC: 29.9 g/dL — ABNORMAL LOW (ref 30.0–36.0)
MCV: 87.5 fL (ref 80.0–100.0)
Monocytes Absolute: 0.9 10*3/uL (ref 0.1–1.0)
Monocytes Relative: 7 %
Neutro Abs: 9.6 10*3/uL — ABNORMAL HIGH (ref 1.7–7.7)
Neutrophils Relative %: 72 %
Platelets: 838 10*3/uL — ABNORMAL HIGH (ref 150–400)
RBC: 3.67 MIL/uL — ABNORMAL LOW (ref 3.87–5.11)
RDW: 24.7 % — ABNORMAL HIGH (ref 11.5–15.5)
Smear Review: NORMAL
WBC: 13.4 10*3/uL — ABNORMAL HIGH (ref 4.0–10.5)
nRBC: 0 % (ref 0.0–0.2)

## 2023-06-23 LAB — PROTIME-INR
INR: 1 (ref 0.8–1.2)
Prothrombin Time: 13.4 s (ref 11.4–15.2)

## 2023-06-23 LAB — APTT: aPTT: <22 s — ABNORMAL LOW (ref 24–36)

## 2023-06-23 LAB — LACTIC ACID, PLASMA
Lactic Acid, Venous: 1.8 mmol/L (ref 0.5–1.9)
Lactic Acid, Venous: 1.1 mmol/L (ref 0.5–1.9)

## 2023-06-23 MED ORDER — SODIUM CHLORIDE 0.9 % IV BOLUS (SEPSIS)
1000.0000 mL | Freq: Once | INTRAVENOUS | Status: AC
  Administered 2023-06-23: 1000 mL via INTRAVENOUS

## 2023-06-23 MED ORDER — HEPARIN SODIUM (PORCINE) 5000 UNIT/ML IJ SOLN
5000.0000 [IU] | Freq: Three times a day (TID) | INTRAMUSCULAR | Status: DC
  Administered 2023-06-23 – 2023-06-30 (×19): 5000 [IU] via SUBCUTANEOUS
  Filled 2023-06-23 (×19): qty 1

## 2023-06-23 MED ORDER — SODIUM CHLORIDE 0.9 % IV SOLN
2.0000 g | Freq: Once | INTRAVENOUS | Status: AC
  Administered 2023-06-23: 2 g via INTRAVENOUS
  Filled 2023-06-23: qty 12.5

## 2023-06-23 MED ORDER — SODIUM CHLORIDE 0.9 % IV SOLN
INTRAVENOUS | Status: AC
  Administered 2023-06-24: 75 mL/h via INTRAVENOUS

## 2023-06-23 MED ORDER — VANCOMYCIN HCL IN DEXTROSE 1-5 GM/200ML-% IV SOLN
1000.0000 mg | Freq: Once | INTRAVENOUS | Status: AC
  Administered 2023-06-23: 1000 mg via INTRAVENOUS
  Filled 2023-06-23: qty 200

## 2023-06-23 MED ORDER — ONDANSETRON HCL 4 MG/2ML IJ SOLN
4.0000 mg | Freq: Three times a day (TID) | INTRAMUSCULAR | Status: DC | PRN
  Administered 2023-06-24 – 2023-06-30 (×2): 4 mg via INTRAVENOUS
  Filled 2023-06-23 (×2): qty 2

## 2023-06-23 MED ORDER — METRONIDAZOLE 500 MG/100ML IV SOLN
500.0000 mg | Freq: Once | INTRAVENOUS | Status: AC
  Administered 2023-06-23: 500 mg via INTRAVENOUS
  Filled 2023-06-23: qty 100

## 2023-06-23 MED ORDER — ACETAMINOPHEN 325 MG PO TABS
650.0000 mg | ORAL_TABLET | Freq: Four times a day (QID) | ORAL | Status: DC | PRN
  Administered 2023-06-23 – 2023-06-30 (×7): 650 mg via ORAL
  Filled 2023-06-23 (×7): qty 2

## 2023-06-23 NOTE — H&P (Signed)
 History and Physical    Laura Wilcox YQM:[id number] DOB: 12-Sep-1926 DOA: 06/23/2023  Referring MD/NP/PA:   PCP: Smiley Houseman, NP   Patient coming from:  The patient is coming from home.     Chief Complaint:   HPI: Laura Wilcox is a 89 y.o. female with medical history significant of      Data reviewed independently and ED Course: pt was found to have     ***       EKG: I have personally reviewed.  Not done in ED, will get one.   ***   Review of Systems:   General: no fevers, chills, no body weight gain, has poor appetite, has fatigue HEENT: no blurry vision, hearing changes or sore throat Respiratory: no dyspnea, coughing, wheezing CV: no chest pain, no palpitations GI: no nausea, vomiting, abdominal pain, diarrhea, constipation GU: no dysuria, burning on urination, increased urinary frequency, hematuria  Ext: no leg edema Neuro: no unilateral weakness, numbness, or tingling, no vision change or hearing loss Skin: no rash, no skin tear. MSK: No muscle spasm, no deformity, no limitation of range of movement in spin Heme: No easy bruising.  Travel history: No recent long distant travel.   Allergy:  Allergies  Allergen Reactions   Bactrim [Sulfamethoxazole-Trimethoprim]     Due to kidney disease    Epinephrine Other (See Comments)   Other     Per daughter RUE BP significantly higher than LUE    Past Medical History:  Diagnosis Date   Acute metabolic encephalopathy 10/22/2020   Complication of anesthesia    pt gets confused a day after receiving anesthesia per the daughter   Confusion 12/30/2018   Dementia (HCC)    Hypothyroidism    Polycythemia    SCC (squamous cell carcinoma) 04/11/2021   R med dorsum hand - ED&C SCCIS   SCC (squamous cell carcinoma) 10/09/2021   left lateral eye, EDC   SCC (squamous cell carcinoma) 01/28/2022   left lateral eye, recurrent, EDC. 5FU/Calcipotriene cream.   SCC (squamous cell carcinoma) 01/28/2022   in situ,  left medial cheek, 5FU/Calcipotriene Cr   SCC (squamous cell carcinoma) 02/26/2022   SCC IS, Right Ulnar Dorsal Hand, 5FU/Calcipotriene cream   SCC (squamous cell carcinoma) 09/03/2022   R ankle, EDC   SCC (squamous cell carcinoma) 01/20/2023   SCC IS Left medial lower leg, EDC   Squamous cell carcinoma of skin 09/06/2020   R thumb - ED&C   Urinary tract infection without hematuria 01/02/2020   Urticaria 06/02/2017    Past Surgical History:  Procedure Laterality Date   ABDOMINAL AORTOGRAM W/LOWER EXTREMITY N/A 04/15/2023   Procedure: ABDOMINAL AORTOGRAM W/LOWER EXTREMITY;  Surgeon: Nada Libman, MD;  Location: [facility];  Service: Cardiovascular;  Laterality: N/A;   ABDOMINAL HYSTERECTOMY     complete   APPLICATION OF WOUND VAC Left 04/21/2023   Procedure: APPLICATION OF WOUND VAC;  Surgeon: Peggye Form, DO;  Location: [facility];  Service: Plastics;  Laterality: Left;   APPLICATION OF WOUND VAC Left 04/28/2023   Procedure: WOUND VAC CHANGE;  Surgeon: Peggye Form, DO;  Location: [facility];  Service: Plastics;  Laterality: Left;   APPLICATION OF WOUND VAC N/A 05/05/2023   Procedure: WOUND VAC CHANGE APPLICATION OF SKIN SUBSTITUTE;  Surgeon: Peggye Form, DO;  Location: [facility];  Service: Plastics;  Laterality: N/A;   APPLICATION OF WOUND VAC Left 06/09/2023   Procedure: APPLICATION, WOUND VAC;  Surgeon: Peggye Form, DO;  Location: [facility];  Service: Plastics;  Laterality: Left;   HEMATOMA EVACUATION Left 04/02/2023   Procedure: EVACUATION ANKLE HEMATOMA;  Surgeon: Nada Libman, MD;  Location: [facility];  Service: Vascular;  Laterality: Left;   I & D EXTREMITY Left 06/09/2023   Procedure: irrigation and debridement of left lower extremity wound with placement of myriad and placement of wound vac;  Surgeon: Peggye Form, DO;  Location: [facility];  Service: Plastics;  Laterality: Left;   INCISION AND DRAINAGE OF WOUND Left 04/21/2023   Procedure:  IRRIGATION AND DEBRIDEMENT WOUND;  Surgeon: Peggye Form, DO;  Location: [facility];  Service: Plastics;  Laterality: Left;   PERIPHERAL VASCULAR BALLOON ANGIOPLASTY  04/15/2023   Procedure: PERIPHERAL VASCULAR BALLOON ANGIOPLASTY;  Surgeon: Nada Libman, MD;  [facility]: [facility];  Service: Cardiovascular;;   PERIPHERAL VASCULAR INTERVENTION  04/15/2023   Procedure: PERIPHERAL VASCULAR INTERVENTION;  Surgeon: Nada Libman, MD;  Location: [facility];  Service: Cardiovascular;;    Social History:  reports that she has quit smoking. Her smoking use included cigarettes. She has never used smokeless tobacco. She reports that she does not drink alcohol and does not use drugs.  Family History:  Family History  Problem Relation Age of Onset   Heart disease Father    Cancer Sister        breast   Cancer Sister        breast   Alzheimer's disease Sister      Prior to Admission medications   Medication Sig Start Date End Date Taking? Authorizing Provider  acetaminophen (TYLENOL) 325 MG tablet Take 650 mg by mouth See admin instructions. Take 650 mg once daily, may take 650 mg dose every 6 hours as needed for mild pain    [provider]  Amino Acids-Protein Hydrolys (FEEDING SUPPLEMENT, PRO-STAT SUGAR FREE 64,) LIQD Take by mouth 2 (two) times daily.    [provider]  ammonium lactate (AMLACTIN) 12 % cream Apply topically qd/bid to dry areas at lower legs and arms Patient taking differently: Apply 1 Application topically 2 (two) times daily. Apply topically qd/bid to dry areas at lower legs and arms 01/20/23   Willeen Niece, MD  ascorbic acid (VITAMIN C) 500 MG tablet Take 500 mg by mouth daily.    [provider]  aspirin EC 81 MG tablet Take 81 mg by mouth daily. Swallow whole.    [provider]  calcitRIOL (ROCALTROL) 0.25 MCG capsule Take 0.25 mcg by mouth every Monday, Wednesday, and Friday.    [provider]   Cholecalciferol (VITAMIN D3) 25 MCG (1000 UT) CAPS Take 1,000 Units by mouth daily. 10/22/22   [provider]  citalopram (CELEXA) 10 MG tablet Take 10 mg by mouth daily.    [provider]  clotrimazole-betamethasone (LOTRISONE) cream Apply 1 Application topically 2 (two) times daily.    [provider]  ferrous sulfate 325 (65 FE) MG EC tablet Take 325 mg by mouth daily.    [provider]  hydrocortisone 1 % ointment Apply 1 Application topically daily.    [provider]  hydrocortisone 2.5 % cream Apply topically twice daily to aa of right cheek as needed for itch Patient taking differently: Apply 1 Application topically every 12 (twelve) hours as needed (itching of right cheek). 01/20/23   Willeen Niece, MD  hydrOXYzine (ATARAX) 25 MG tablet Take 25 mg by mouth every 8 (eight) hours as needed for itching.  Patient not taking: Reported on 06/18/2023    [provider]  levothyroxine (SYNTHROID) 25 MCG tablet Take 25 mcg by mouth daily. 03/18/23   [provider]  Multiple Vitamin (MULTIVITAMIN WITH MINERALS) TABS tablet Take 1 tablet by mouth daily.    [provider]  naloxone [facility]) nasal spray 4 mg/0.1 mL Place 1 spray into the nose 3 (three) times daily as needed (opioid overdose). Patient not taking: Reported on 06/18/2023    [provider]  oxyCODONE (OXY IR/ROXICODONE) 5 MG immediate release tablet Take 1 tablet (5 mg total) by mouth every 6 (six) hours as needed for severe pain (pain score 7-10). Patient not taking: Reported on 06/18/2023 04/29/23   Hughie Closs, MD  polyethylene glycol (MIRALAX / GLYCOLAX) 17 g packet Take 17 g by mouth daily as needed for mild constipation. Patient taking differently: Take 17 g by mouth daily. 04/11/23   Marcelino Duster, MD  ruxolitinib phosphate (JAKAFI) 5 MG tablet Take 10 mg by mouth in the morning and 5 mg in the evening 06/18/23   Rickard Patience, MD  traZODone  (DESYREL) 50 MG tablet Take 25 mg by mouth at bedtime as needed for sleep.    [provider]  vitamin B-12 (CYANOCOBALAMIN) 500 MCG tablet Take 1,000 mcg by mouth daily.    [provider]    Physical Exam: Vitals:   06/23/23 1716 06/23/23 1722 06/23/23 2134  BP:  113/84   Pulse: 90    Resp: 18    Temp:  (!) 96.3 F (35.7 C) 98.2 F (36.8 C)  TempSrc:   Axillary  SpO2: 100%     General: Not in acute distress HEENT:       Eyes: PERRL, EOMI, no jaundice       ENT: No discharge from the ears and nose, no pharynx injection, no tonsillar enlargement.        Neck: No JVD, no bruit, no mass felt. Heme: No neck lymph node enlargement. Cardiac: S1/S2, RRR, No murmurs, No gallops or rubs. Respiratory: No rales, wheezing, rhonchi or rubs. GI: Soft, nondistended, nontender, no rebound pain, no organomegaly, BS present. GU: No hematuria Ext: No pitting leg edema bilaterally. 1+DP/PT pulse bilaterally. Musculoskeletal: No joint deformities, No joint redness or warmth, no limitation of ROM in spin. Skin: No rashes.  Neuro: Alert, oriented X3, cranial nerves II-XII grossly intact, moves all extremities normally. Muscle strength 5/5 in all extremities, sensation to light touch intact. Brachial reflex 2+ bilaterally. Knee reflex 1+ bilaterally. Negative Babinski's sign. Normal finger to nose test. Psych: Patient is not psychotic, no suicidal or hemocidal ideation.  Labs on Admission: I have personally reviewed following labs and imaging studies  CBC: Recent Labs  Lab 06/18/23 1310 06/23/23 1736  WBC 11.8* 13.4*  NEUTROABS 8.1* 9.6*  HGB 9.2* 9.6*  HCT 31.6* 32.1*  MCV 87.3 87.5  PLT 783* 838*   Basic Metabolic Panel: No results for input(s): "NA", "K", "CL", "CO2", "GLUCOSE", "BUN", "CREATININE", "CALCIUM", "MG", "PHOS" in the last 168 hours. GFR: CrCl cannot be calculated (Unknown ideal weight.). Liver Function Tests: No results for input(s): "AST", "ALT",  "ALKPHOS", "BILITOT", "PROT", "ALBUMIN" in the last 168 hours. No results for input(s): "LIPASE", "AMYLASE" in the last 168 hours. No results for input(s): "AMMONIA" in the last 168 hours. Coagulation Profile: Recent Labs  Lab 06/23/23 1830  INR 1.0   Cardiac Enzymes: No results for input(s): "CKTOTAL", "CKMB", "CKMBINDEX", "TROPONINI" in the last 168 hours. BNP (last 3 results)  No results for input(s): "PROBNP" in the last 8760 hours. HbA1C: No results for input(s): "HGBA1C" in the last 72 hours. CBG: No results for input(s): "GLUCAP" in the last 168 hours. Lipid Profile: No results for input(s): "CHOL", "HDL", "LDLCALC", "TRIG", "CHOLHDL", "LDLDIRECT" in the last 72 hours. Thyroid Function Tests: No results for input(s): "TSH", "T4TOTAL", "FREET4", "T3FREE", "THYROIDAB" in the last 72 hours. Anemia Panel: No results for input(s): "VITAMINB12", "FOLATE", "FERRITIN", "TIBC", "IRON", "RETICCTPCT" in the last 72 hours. Urine analysis:    Component Value Date/Time   COLORURINE YELLOW (A) 06/23/2023 2123   APPEARANCEUR HAZY (A) 06/23/2023 2123   LABSPEC 1.016 06/23/2023 2123   PHURINE 5.0 06/23/2023 2123   GLUCOSEU NEGATIVE 06/23/2023 2123   HGBUR NEGATIVE 06/23/2023 2123   BILIRUBINUR NEGATIVE 06/23/2023 2123   BILIRUBINUR neg 03/30/2021 1430   KETONESUR 5 (A) 06/23/2023 2123   PROTEINUR NEGATIVE 06/23/2023 2123   UROBILINOGEN 0.2 03/30/2021 1430   NITRITE POSITIVE (A) 06/23/2023 2123   LEUKOCYTESUR TRACE (A) 06/23/2023 2123   Sepsis Labs: @LABRCNTIP (procalcitonin:4,lacticidven:4) ) Recent Results (from the past 240 hours)  Resp panel by RT-PCR (RSV, Flu A&B, Covid) Anterior Nasal Swab     Status: None   Collection Time: 06/23/23  8:45 PM   Specimen: Anterior Nasal Swab  Result Value Ref Range Status   SARS Coronavirus 2 by RT PCR NEGATIVE NEGATIVE Final    Comment: (NOTE) SARS-CoV-2 target nucleic acids are NOT DETECTED.  The SARS-CoV-2 RNA is generally detectable in  upper respiratory specimens during the acute phase of infection. The lowest concentration of SARS-CoV-2 viral copies this assay can detect is 138 copies/mL. A negative result does not preclude SARS-Cov-2 infection and should not be used as the sole basis for treatment or other patient management decisions. A negative result may occur with  improper specimen collection/handling, submission of specimen other than nasopharyngeal swab, presence of viral mutation(s) within the areas targeted by this assay, and inadequate number of viral copies(<138 copies/mL). A negative result must be combined with clinical observations, patient history, and epidemiological information. The expected result is Negative.  Fact Sheet for Patients:  BloggerCourse.com  Fact Sheet for Healthcare Providers:  SeriousBroker.it  This test is no t yet approved or cleared by the [place] FDA and  has been authorized for detection and/or diagnosis of SARS-CoV-2 by FDA under an Emergency Use Authorization (EUA). This EUA will remain  in effect (meaning this test can be used) for the duration of the COVID-19 declaration under Section 564(b)(1) of the Act, 21 U.S.C.section 360bbb-3(b)(1), unless the authorization is terminated  or revoked sooner.       Influenza A by PCR NEGATIVE NEGATIVE Final   Influenza B by PCR NEGATIVE NEGATIVE Final    Comment: (NOTE) The Xpert Xpress SARS-CoV-2/FLU/RSV plus assay is intended as an aid in the diagnosis of influenza from Nasopharyngeal swab specimens and should not be used as a sole basis for treatment. Nasal washings and aspirates are unacceptable for Xpert Xpress SARS-CoV-2/FLU/RSV testing.  Fact Sheet for Patients: BloggerCourse.com  Fact Sheet for Healthcare Providers: SeriousBroker.it  This test is not yet approved or cleared by the [place] FDA and has been  authorized for detection and/or diagnosis of SARS-CoV-2 by FDA under an Emergency Use Authorization (EUA). This EUA will remain in effect (meaning this test can be used) for the duration of the COVID-19 declaration under Section 564(b)(1) of the Act, 21 U.S.C. section 360bbb-3(b)(1), unless the authorization is terminated or revoked.  Resp Syncytial Virus by PCR NEGATIVE NEGATIVE Final    Comment: (NOTE) Fact Sheet for Patients: BloggerCourse.com  Fact Sheet for Healthcare Providers: SeriousBroker.it  This test is not yet approved or cleared by the [place] FDA and has been authorized for detection and/or diagnosis of SARS-CoV-2 by FDA under an Emergency Use Authorization (EUA). This EUA will remain in effect (meaning this test can be used) for the duration of the COVID-19 declaration under Section 564(b)(1) of the Act, 21 U.S.C. section 360bbb-3(b)(1), unless the authorization is terminated or revoked.  Performed at [facility], [address] [place], [place] [address]      Radiological Exams on Admission:   Assessment/Plan Active Problems:   * No active hospital problems. *   Assessment and Plan: No notes have been filed under this hospital service. Service: Hospitalist      Active Problems:   * No active hospital problems. *    DVT ppx: SQ Heparin         SQ Lovenox  Code Status: Full code   ***  Family Communication:     not done, no family member is at bed side.              Yes, patient's    at bed side.       by phone   ***  Disposition Plan:  Anticipate discharge back to previous environment  Consults called:    Admission status and Level of care: :    for obs as inpt        Dispo: The patient is from: {From:23814}              Anticipated d/c is to: {To:23815}              Anticipated d/c date is: {Days:23816}              Patient currently {Medically  stable:23817}    Severity of Illness:  {Observation/Inpatient:21159}       Date of Service 06/23/2023    Lorretta Harp [facility]   If 7PM-7AM, please contact night-coverage [email or web address] 06/23/2023, 10:57 PM

## 2023-06-23 NOTE — Progress Notes (Deleted)
   Referring Provider Smiley Houseman, NP [phone] [place],  [place] [address]   CC: No chief complaint on file.     Laura Wilcox is an 89 y.o. female.  HPI: Patient is a 89 year old female with history of a wound to her left leg. She most recently underwent excision of left leg wound skin, soft tissue and tendon with placement of myriad and placement of wound VAC with Dr. Ulice Bold on 06/09/2023.  Patient presents to the clinic today for further wound management.  Patient was last seen in the clinic on 06/17/2023.  At this visit, wound to the proximal left lower extremity appeared to be improving.  To the distal wound, there was some unincorporated myriad to the anterior portion of the wound, wound otherwise appeared to have healthy tissue throughout.  Ulcer to the heel was still present with eschar throughout the entirety of the wound.  Today,  Review of Systems General: ***  Physical Exam    06/18/2023    1:20 PM 06/17/2023   11:14 AM 06/16/2023   12:44 PM  Vitals with BMI  Systolic 113 131 308  Diastolic 66 67 64  Pulse 79 61 62    General:  No acute distress,  Alert and oriented, Non-Toxic, Normal speech and affect ***   Assessment/Plan ***  Laura Wilcox 06/23/2023, 1:40 PM

## 2023-06-23 NOTE — Progress Notes (Signed)
 CODE SEPSIS - PHARMACY COMMUNICATION  **Broad-spectrum antimicrobials should be administered within one hour of sepsis diagnosis**  Time Code Sepsis call or page was received: 1744  Antibiotics ordered: Cefepime, Vancomycin, Metronidazole  Time of first antibiotic administration: 1756  Additional action taken by pharmacy: N/A  If necessary, name of provider/nurse contacted: N/A    Will M. Dareen Piano, PharmD Clinical Pharmacist 06/23/2023 6:15 PM

## 2023-06-23 NOTE — H&P (Incomplete)
 History and Physical    Laura Wilcox ZOX:[id number] DOB: September 18, 1926 DOA: 06/23/2023  Referring MD/NP/PA:   PCP: Smiley Houseman, NP   Patient coming from:  The patient is coming from SNF   Chief Complaint: AMS  HPI: Laura Wilcox is a 89 y.o. female with medical history significant of dementia, hypothyroidism, depression with anxiety, CKD-3B, skin cancer, UTI, myelofibrosis and polycythemia rubra vera, chronic left foot wound after hematoma (VAC), who presents with altered mental status.  Patient has AMS, and is unable to provide any medical history. I called her son by phone, who provided medical history.  Per his son, patient has been confused for most a week which has worsened in the past 2 days.  At her normal baseline, patient is talking, orientated to person and place, but not to the time.  When I saw patient in ED, patient is not oriented x 3.  She moves all extremities on painful stimuli.  Patient has acute respiratory distress, with oxygen desaturation to 70% per nurse report, nonrebreather was started with 100% of saturation.  Patient is not using oxygen normally.  Per her son, patient has chronic cough, does not seem to have chest pain. Patient was recently treated for pneumonia.  No nausea, vomiting, diarrhea or abdominal pain.  Not sure if patient has symptoms of UTI.  Of note, patient has chronic wound in left foot which is sequela of big hematoma.  Patient is following up with plastic surgeon. Per her son, pt has pus drainage from the wound.   Data reviewed independently and ED Course: pt was found to have WBC 13.4, positive UA (yellow appearance, trace amount of leukocyte, positive nitrite, few bacteria, WBC 21-50), negative PCR for COVID, flu and RSV, lactic acid 1.8, temperature 96.3, 98.2, blood pressure 113/84, heart rate 90, RR 18.  Chest x-ray with a hiatal hernia without infiltration.  Patient is admitted to PCU as inpatient   EKG: I have personally reviewed.   Has artificial effects, sinus rhythm, QTc 451, poor R wave progression   Review of Systems: Could not  be reviewed due to altered mental status.   Allergy:  Allergies  Allergen Reactions  . Bactrim [Sulfamethoxazole-Trimethoprim]     Due to kidney disease   . Epinephrine Other (See Comments)  . Other     Per daughter RUE BP significantly higher than LUE    Past Medical History:  Diagnosis Date  . Acute metabolic encephalopathy 10/22/2020  . Complication of anesthesia    pt gets confused a day after receiving anesthesia per the daughter  . Confusion 12/30/2018  . Dementia (HCC)   . Hypothyroidism   . Polycythemia   . SCC (squamous cell carcinoma) 04/11/2021   R med dorsum hand - ED&C SCCIS  . SCC (squamous cell carcinoma) 10/09/2021   left lateral eye, EDC  . SCC (squamous cell carcinoma) 01/28/2022   left lateral eye, recurrent, EDC. 5FU/Calcipotriene cream.  . SCC (squamous cell carcinoma) 01/28/2022   in situ, left medial cheek, 5FU/Calcipotriene Cr  . SCC (squamous cell carcinoma) 02/26/2022   SCC IS, Right Ulnar Dorsal Hand, 5FU/Calcipotriene cream  . SCC (squamous cell carcinoma) 09/03/2022   R ankle, EDC  . SCC (squamous cell carcinoma) 01/20/2023   SCC IS Left medial lower leg, EDC  . Squamous cell carcinoma of skin 09/06/2020   R thumb - ED&C  . Urinary tract infection without hematuria 01/02/2020  . Urticaria 06/02/2017    Past Surgical History:  Procedure Laterality Date  .  ABDOMINAL AORTOGRAM W/LOWER EXTREMITY N/A 04/15/2023   Procedure: ABDOMINAL AORTOGRAM W/LOWER EXTREMITY;  Surgeon: Nada Libman, MD;  [facility]: [facility];  Service: Cardiovascular;  Laterality: N/A;  . ABDOMINAL HYSTERECTOMY     complete  . APPLICATION OF WOUND VAC Left 04/21/2023   Procedure: APPLICATION OF WOUND VAC;  Surgeon: Peggye Form, DO;  Location: [facility];  Service: Plastics;  Laterality: Left;  . APPLICATION OF WOUND VAC Left 04/28/2023   Procedure:  WOUND VAC CHANGE;  Surgeon: Peggye Form, DO;  Location: [facility];  Service: Plastics;  Laterality: Left;  . APPLICATION OF WOUND VAC N/A 05/05/2023   Procedure: WOUND VAC CHANGE APPLICATION OF SKIN SUBSTITUTE;  Surgeon: Peggye Form, DO;  Location: [facility];  Service: Plastics;  Laterality: N/A;  . APPLICATION OF WOUND VAC Left 06/09/2023   Procedure: APPLICATION, WOUND VAC;  Surgeon: Peggye Form, DO;  Location: [facility];  Service: Plastics;  Laterality: Left;  . HEMATOMA EVACUATION Left 04/02/2023   Procedure: EVACUATION ANKLE HEMATOMA;  Surgeon: Nada Libman, MD;  Location: [facility];  Service: Vascular;  Laterality: Left;  . I & D EXTREMITY Left 06/09/2023   Procedure: irrigation and debridement of left lower extremity wound with placement of myriad and placement of wound vac;  Surgeon: Peggye Form, DO;  Location: [facility];  Service: Plastics;  Laterality: Left;  . INCISION AND DRAINAGE OF WOUND Left 04/21/2023   Procedure: IRRIGATION AND DEBRIDEMENT WOUND;  Surgeon: Peggye Form, DO;  Location: [facility];  Service: Plastics;  Laterality: Left;  . PERIPHERAL VASCULAR BALLOON ANGIOPLASTY  04/15/2023   Procedure: PERIPHERAL VASCULAR BALLOON ANGIOPLASTY;  Surgeon: Nada Libman, MD;  [facility];  Service: Cardiovascular;;  . PERIPHERAL VASCULAR INTERVENTION  04/15/2023   Procedure: PERIPHERAL VASCULAR INTERVENTION;  Surgeon: Nada Libman, MD;  [facility];  Service: Cardiovascular;;    Social History:  reports that she has quit smoking. Her smoking use included cigarettes. She has never used smokeless tobacco. She reports that she does not drink alcohol and does not use drugs.  Family History:  Family History  Problem Relation Age of Onset  . Heart disease Father   . Cancer Sister        breast  . Cancer Sister        breast  . Alzheimer's disease Sister      Prior to Admission medications   Medication Sig Start Date  End Date Taking? Authorizing Provider  acetaminophen (TYLENOL) 325 MG tablet Take 650 mg by mouth See admin instructions. Take 650 mg once daily, may take 650 mg dose every 6 hours as needed for mild pain    [provider]  Amino Acids-Protein Hydrolys (FEEDING SUPPLEMENT, PRO-STAT SUGAR FREE 64,) LIQD Take by mouth 2 (two) times daily.    [provider]  ammonium lactate (AMLACTIN) 12 % cream Apply topically qd/bid to dry areas at lower legs and arms Patient taking differently: Apply 1 Application topically 2 (two) times daily. Apply topically qd/bid to dry areas at lower legs and arms 01/20/23   Willeen Niece, MD  ascorbic acid (VITAMIN C) 500 MG tablet Take 500 mg by mouth daily.    [provider]  aspirin EC 81 MG tablet Take 81 mg by mouth daily. Swallow whole.    [provider]  calcitRIOL (ROCALTROL) 0.25 MCG capsule Take 0.25 mcg by mouth every Monday, Wednesday, and Friday.    [provider]  Cholecalciferol (VITAMIN D3) 25 MCG (1000 UT) CAPS Take 1,000 Units by mouth daily. 10/22/22   [provider]  citalopram (CELEXA) 10 MG tablet Take 10 mg by mouth daily.    [provider]  clotrimazole-betamethasone (LOTRISONE) cream Apply 1 Application topically 2 (two) times daily.    [provider]  ferrous sulfate 325 (65 FE) MG EC tablet Take 325 mg by mouth daily.    [provider]  hydrocortisone 1 % ointment Apply 1 Application topically daily.    [provider]  hydrocortisone 2.5 % cream Apply topically twice daily to aa of right cheek as needed for itch Patient taking differently: Apply 1 Application topically every 12 (twelve) hours as needed (itching of right cheek). 01/20/23   Willeen Niece, MD  hydrOXYzine (ATARAX) 25 MG tablet Take 25 mg by mouth every 8 (eight) hours as needed for itching. Patient not taking: Reported on 06/18/2023    [provider]  levothyroxine (SYNTHROID) 25  MCG tablet Take 25 mcg by mouth daily. 03/18/23   [provider]  Multiple Vitamin (MULTIVITAMIN WITH MINERALS) TABS tablet Take 1 tablet by mouth daily.    [provider]  naloxone [facility]) nasal spray 4 mg/0.1 mL Place 1 spray into the nose 3 (three) times daily as needed (opioid overdose). Patient not taking: Reported on 06/18/2023    [provider]  oxyCODONE (OXY IR/ROXICODONE) 5 MG immediate release tablet Take 1 tablet (5 mg total) by mouth every 6 (six) hours as needed for severe pain (pain score 7-10). Patient not taking: Reported on 06/18/2023 04/29/23   Hughie Closs, MD  polyethylene glycol (MIRALAX / GLYCOLAX) 17 g packet Take 17 g by mouth daily as needed for mild constipation. Patient taking differently: Take 17 g by mouth daily. 04/11/23   Marcelino Duster, MD  ruxolitinib phosphate (JAKAFI) 5 MG tablet Take 10 mg by mouth in the morning and 5 mg in the evening 06/18/23   Rickard Patience, MD  traZODone (DESYREL) 50 MG tablet Take 25 mg by mouth at bedtime as needed for sleep.    [provider]  vitamin B-12 (CYANOCOBALAMIN) 500 MCG tablet Take 1,000 mcg by mouth daily.    [provider]    Physical Exam: Vitals:   06/23/23 1716 06/23/23 1722 06/23/23 2134  BP:  113/84   Pulse: 90    Resp: 18    Temp:  (!) 96.3 F (35.7 C) 98.2 F (36.8 C)  TempSrc:   Axillary  SpO2: 100%     General: Has acute respiratory distress HEENT:       Eyes: PERRL, EOMI, no jaundice       ENT: No discharge from the ears and nose       Neck: No JVD, no bruit, no mass felt. Heme: No neck lymph node enlargement. Cardiac: S1/S2, RRR, No murmurs, No gallops or rubs. Respiratory: Has coarse breath sounds bilaterally GI: Soft, nondistended, nontender, no organomegaly, BS present. GU: No hematuria Ext: has left foot wound on vac with purulent drainage     Musculoskeletal: No joint deformities, No joint redness or warmth, no limitation of ROM in  spin. Skin: No rashes.  Neuro: Altered, seem to know her own name, not orientated to the place and time, moves extremities slightly on painful stimuli.  Psych: Patient is not psychotic, no suicidal or hemocidal ideation.  Labs on Admission: I have personally reviewed following labs and imaging studies  CBC: Recent Labs  Lab 06/18/23 1310 06/23/23 1736  WBC 11.8* 13.4*  NEUTROABS 8.1* 9.6*  HGB 9.2* 9.6*  HCT 31.6* 32.1*  MCV 87.3 87.5  PLT 783* 838*   Basic Metabolic Panel: Recent Labs  Lab 06/23/23 2241  NA 136  K 4.6  CL 103  CO2 23  GLUCOSE 88  BUN 25*  CREATININE 0.90  CALCIUM 9.1   GFR: CrCl cannot be calculated (Unknown ideal weight.). Liver Function Tests: Recent Labs  Lab 06/23/23 2241  AST 23  ALT 10  ALKPHOS 65  BILITOT 0.9  PROT 5.9*  ALBUMIN 2.9*   No results for input(s): "LIPASE", "AMYLASE" in the last 168 hours. No results for input(s): "AMMONIA" in the last 168 hours. Coagulation Profile: Recent Labs  Lab 06/23/23 1830  INR 1.0   Cardiac Enzymes: No results for input(s): "CKTOTAL", "CKMB", "CKMBINDEX", "TROPONINI" in the last 168 hours. BNP (last 3 results) No results for input(s): "PROBNP" in the last 8760 hours. HbA1C: No results for input(s): "HGBA1C" in the last 72 hours. CBG: No results for input(s): "GLUCAP" in the last 168 hours. Lipid Profile: No results for input(s): "CHOL", "HDL", "LDLCALC", "TRIG", "CHOLHDL", "LDLDIRECT" in the last 72 hours. Thyroid Function Tests: No results for input(s): "TSH", "T4TOTAL", "FREET4", "T3FREE", "THYROIDAB" in the last 72 hours. Anemia Panel: No results for input(s): "VITAMINB12", "FOLATE", "FERRITIN", "TIBC", "IRON", "RETICCTPCT" in the last 72 hours. Urine analysis:    Component Value Date/Time   COLORURINE YELLOW (A) 06/23/2023 2123   APPEARANCEUR HAZY (A) 06/23/2023 2123   LABSPEC 1.016 06/23/2023 2123   PHURINE 5.0 06/23/2023 2123   GLUCOSEU NEGATIVE 06/23/2023 2123   HGBUR  NEGATIVE 06/23/2023 2123   BILIRUBINUR NEGATIVE 06/23/2023 2123   BILIRUBINUR neg 03/30/2021 1430   KETONESUR 5 (A) 06/23/2023 2123   PROTEINUR NEGATIVE 06/23/2023 2123   UROBILINOGEN 0.2 03/30/2021 1430   NITRITE POSITIVE (A) 06/23/2023 2123   LEUKOCYTESUR TRACE (A) 06/23/2023 2123   Sepsis Labs: @LABRCNTIP (procalcitonin:4,lacticidven:4) ) Recent Results (from the past 240 hours)  Resp panel by RT-PCR (RSV, Flu A&B, Covid) Anterior Nasal Swab     Status: None   Collection Time: 06/23/23  8:45 PM   Specimen: Anterior Nasal Swab  Result Value Ref Range Status   SARS Coronavirus 2 by RT PCR NEGATIVE NEGATIVE Final    Comment: (NOTE) SARS-CoV-2 target nucleic acids are NOT DETECTED.  The SARS-CoV-2 RNA is generally detectable in upper respiratory specimens during the acute phase of infection. The lowest concentration of SARS-CoV-2 viral copies this assay can detect is 138 copies/mL. A negative result does not preclude SARS-Cov-2 infection and should not be used as the sole basis for treatment or other patient management decisions. A negative result may occur with  improper specimen collection/handling, submission of specimen other than nasopharyngeal swab, presence of viral mutation(s) within the areas targeted by this assay, and inadequate number of viral copies(<138 copies/mL). A negative result must be combined with clinical observations, patient history, and epidemiological information. The expected result is Negative.  Fact Sheet for Patients:  BloggerCourse.com  Fact Sheet for Healthcare Providers:  SeriousBroker.it  This test is no t yet approved or cleared by the [place] FDA and  has been authorized for detection and/or diagnosis of SARS-CoV-2 by FDA under an Emergency Use Authorization (EUA). This EUA will remain  in effect (meaning this test can be used) for the duration of the COVID-19 declaration under Section  564(b)(1) of the Act, 21 U.S.C.section 360bbb-3(b)(1), unless the authorization is terminated  or  revoked sooner.       Influenza A by PCR NEGATIVE NEGATIVE Final   Influenza B by PCR NEGATIVE NEGATIVE Final    Comment: (NOTE) The Xpert Xpress SARS-CoV-2/FLU/RSV plus assay is intended as an aid in the diagnosis of influenza from Nasopharyngeal swab specimens and should not be used as a sole basis for treatment. Nasal washings and aspirates are unacceptable for Xpert Xpress SARS-CoV-2/FLU/RSV testing.  Fact Sheet for Patients: BloggerCourse.com  Fact Sheet for Healthcare Providers: SeriousBroker.it  This test is not yet approved or cleared by the [place] FDA and has been authorized for detection and/or diagnosis of SARS-CoV-2 by FDA under an Emergency Use Authorization (EUA). This EUA will remain in effect (meaning this test can be used) for the duration of the COVID-19 declaration under Section 564(b)(1) of the Act, 21 U.S.C. section 360bbb-3(b)(1), unless the authorization is terminated or revoked.     Resp Syncytial Virus by PCR NEGATIVE NEGATIVE Final    Comment: (NOTE) Fact Sheet for Patients: BloggerCourse.com  Fact Sheet for Healthcare Providers: SeriousBroker.it  This test is not yet approved or cleared by the [place] FDA and has been authorized for detection and/or diagnosis of SARS-CoV-2 by FDA under an Emergency Use Authorization (EUA). This EUA will remain in effect (meaning this test can be used) for the duration of the COVID-19 declaration under Section 564(b)(1) of the Act, 21 U.S.C. section 360bbb-3(b)(1), unless the authorization is terminated or revoked.  Performed at [facility], [address] [place], [place] [address]      Radiological Exams on Admission:   Assessment/Plan Principal Problem:   UTI (urinary tract  infection) Active Problems:   Wound infection   Wound of left lower extremity   Acute respiratory failure with hypoxia (HCC)   Chronic kidney disease, stage 3b (HCC)   Hypothyroidism   Anemia in chronic kidney disease   Polycythemia rubra vera (HCC)   Myelofibrosis (HCC)   Depression with anxiety   Protein-calorie malnutrition, moderate (HCC)   Assessment and Plan:  Principal Problem:   UTI (urinary tract infection) Active Problems:   Wound infection   Wound of left lower extremity   Acute respiratory failure with hypoxia (HCC)   Chronic kidney disease, stage 3b (HCC)   Hypothyroidism   Anemia in chronic kidney disease   Polycythemia rubra vera (HCC)   Myelofibrosis (HCC)   Depression with anxiety   Protein-calorie malnutrition, moderate (HCC)        DVT ppx: SQ Heparin   Code Status: DNR per her son  Family Communication:     Yes, patient's son by phone     Disposition Plan:  Anticipate discharge back to previous environment  Consults called:  none  Admission status and Level of care: Progressive:  as inpt        Dispo: The patient is from: SNF              Anticipated d/c is to: SNF              Anticipated d/c date is: 2 days              Patient currently is not medically stable to d/c.    Severity of Illness:  The appropriate patient status for this patient is INPATIENT. Inpatient status is judged to be reasonable and necessary in order to provide the required intensity of service to ensure the patient's safety. The patient's presenting symptoms, physical exam findings, and initial radiographic and laboratory data  in the context of their chronic comorbidities is felt to place them at high risk for further clinical deterioration. Furthermore, it is not anticipated that the patient will be medically stable for discharge from the hospital within 2 midnights of admission.   * I certify that at the point of admission it is my clinical judgment that the  patient will require inpatient hospital care spanning beyond 2 midnights from the point of admission due to high intensity of service, high risk for further deterioration and high frequency of surveillance required.*       Date of Service 06/24/2023    Lorretta Harp [facility]   If 7PM-7AM, please contact night-coverage [email or web address] 06/24/2023, 12:03 AM

## 2023-06-23 NOTE — Sepsis Progress Note (Signed)
 Sepsis protocol is being followed by [facility].

## 2023-06-23 NOTE — ED Provider Notes (Signed)
 [facility] Provider Note    Event Date/Time   First MD Initiated Contact with Patient 06/23/23 1716     (approximate)  History   Chief Complaint: Altered Mental Status  HPI  Laura Wilcox is a 89 y.o. female with a past medical history of metabolic encephalopathy, dementia, left foot wound with a wound VAC in place presents to the emergency department from her nursing facility ([facility]) for increased altered mental status.  According to report for the last 2 days patient has been altered which they describe is more confused and somnolent.  They state patient was recently discharged from [facility] after diagnosis of pneumonia.  Has a known left foot wound that is currently being treated with a wound vacuum.  Patient currently on a nonrebreather mask.  Physical Exam   Triage Vital Signs: ED Triage Vitals  Encounter Vitals Group     BP 06/23/23 1722 113/84     Systolic BP Percentile --      Diastolic BP Percentile --      Pulse Rate 06/23/23 1716 90     Resp 06/23/23 1716 18     Temp 06/23/23 1722 (!) 96.3 F (35.7 C)     Temp src --      SpO2 06/23/23 1716 100 %     Weight --      Height --      Head Circumference --      Peak Flow --      Pain Score --      Pain Loc --      Pain Education --      Exclude from Growth Chart --     Most recent vital signs: Vitals:   06/23/23 1716 06/23/23 1722  BP:  113/84  Pulse: 90   Resp: 18   Temp:  (!) 96.3 F (35.7 C)  SpO2: 100%     General: Patient is awake, she will look at you when you talk to her.  She will answer some basic questions.  Protecting her airway. CV:  Good peripheral perfusion.  Regular rate and rhythm  Resp:  Normal effort.  Equal breath sounds bilaterally.  Abd:  No distention.  Soft, nontender.  No rebound or guarding. Other:  Patient has a small heel ulceration to her right heel.  She has a large appearing wound to the dorsal aspect of her left foot with a wound VAC in  place overlying the wound.   ED Results / Procedures / Treatments   EKG  EKG viewed and interpreted by myself shows a sinus rhythm at 94 bpm with a narrow QRS, normal axis, normal intervals, nonspecific ST changes.  RADIOLOGY  I have reviewed and interpreted chest x-ray images.  There is significant angulation but I do not appreciate any significant consolidation. Radiology has read the x-ray as hiatal hernia without any acute abnormality noted.   MEDICATIONS ORDERED IN ED: Medications - No data to display   IMPRESSION / MDM / ASSESSMENT AND PLAN / ED COURSE  I reviewed the triage vital signs and the nursing notes.  Patient's presentation is most consistent with acute presentation with potential threat to life or bodily function.  Patient presents emergency department for altered mental status described as increased confusion and somnolence over the last 2 days.  Patient has a nonrebreather in place currently satting 100% we will attempt to titrate to nasal cannula.  Patient appears dry with dry appearing mucous membranes we will begin  IV fluids.  Given the patient's altered mental status weakness/confusion with significant foot wound we will send labs, cultures, lactic acid we will start the patient on broad-spectrum antibiotics while awaiting further results.  Awaiting family arrival to discuss in more detail.  Patient's workup shows a unremarkable chest x-ray.  Patient CBC shows mild leukocytosis at 13,000.  Mild anemia.  Patient's respiratory panel is negative.  INR is normal.  Given the patient's elevated white blood cell count with signs of worsening infection we will admit to the hospital service for ongoing workup and treatment and continued antibiotics.  They are now sending the third sample of chemistry that has not yet resulted.  Hospitalist aware that it has been sent and it should be resulting shortly.  FINAL CLINICAL IMPRESSION(S) / ED DIAGNOSES   Altered mental  status Cellulitis of left lower extremity    Note:  This document was prepared using Dragon voice recognition software and may include unintentional dictation errors.   Minna Antis, MD 06/23/23 [phone]

## 2023-06-23 NOTE — ED Triage Notes (Signed)
 Patient comes in via [facility] from [facility] due to altered mental status. According to EMS, family states that the patient has been altered x2 days. Pt was recently discharged from [facility] with pneumonia. Pt has a wound on her left foot, with a wound vacuum in place. Pt also has bilateral heal wounds. Pt is responsive to painful stimuli. Pt is alert and oriented x1. Pt currently on a non-re-breather. MD Paduchowski to bedside at this time,

## 2023-06-23 NOTE — Progress Notes (Signed)
 Received call from SNF stating patient was experiencing AMS. I recommended RN to contact provider at the SNF immediately so patient may be evaluated and discussed with her she may potentially need to go to ED.

## 2023-06-23 NOTE — ED Notes (Signed)
 Lab to come draw additional labs

## 2023-06-24 ENCOUNTER — Inpatient Hospital Stay

## 2023-06-24 ENCOUNTER — Other Ambulatory Visit: Payer: Self-pay

## 2023-06-24 ENCOUNTER — Ambulatory Visit: Payer: PRIVATE HEALTH INSURANCE | Admitting: Student

## 2023-06-24 ENCOUNTER — Encounter: Payer: Self-pay | Admitting: Internal Medicine

## 2023-06-24 DIAGNOSIS — S81802D Unspecified open wound, left lower leg, subsequent encounter: Secondary | ICD-10-CM

## 2023-06-24 DIAGNOSIS — T148XXA Other injury of unspecified body region, initial encounter: Secondary | ICD-10-CM | POA: Diagnosis not present

## 2023-06-24 DIAGNOSIS — N1832 Chronic kidney disease, stage 3b: Secondary | ICD-10-CM | POA: Diagnosis present

## 2023-06-24 DIAGNOSIS — L089 Local infection of the skin and subcutaneous tissue, unspecified: Secondary | ICD-10-CM | POA: Diagnosis present

## 2023-06-24 DIAGNOSIS — X58XXXA Exposure to other specified factors, initial encounter: Secondary | ICD-10-CM | POA: Diagnosis present

## 2023-06-24 DIAGNOSIS — F0393 Unspecified dementia, unspecified severity, with mood disturbance: Secondary | ICD-10-CM | POA: Diagnosis present

## 2023-06-24 DIAGNOSIS — N390 Urinary tract infection, site not specified: Secondary | ICD-10-CM | POA: Diagnosis present

## 2023-06-24 DIAGNOSIS — Z66 Do not resuscitate: Secondary | ICD-10-CM | POA: Diagnosis present

## 2023-06-24 DIAGNOSIS — L89102 Pressure ulcer of unspecified part of back, stage 2: Secondary | ICD-10-CM | POA: Diagnosis present

## 2023-06-24 DIAGNOSIS — R7881 Bacteremia: Secondary | ICD-10-CM | POA: Diagnosis present

## 2023-06-24 DIAGNOSIS — G9341 Metabolic encephalopathy: Secondary | ICD-10-CM | POA: Diagnosis present

## 2023-06-24 DIAGNOSIS — Z515 Encounter for palliative care: Secondary | ICD-10-CM

## 2023-06-24 DIAGNOSIS — F0394 Unspecified dementia, unspecified severity, with anxiety: Secondary | ICD-10-CM | POA: Diagnosis present

## 2023-06-24 DIAGNOSIS — R4182 Altered mental status, unspecified: Secondary | ICD-10-CM | POA: Diagnosis present

## 2023-06-24 DIAGNOSIS — Z1624 Resistance to multiple antibiotics: Secondary | ICD-10-CM | POA: Diagnosis present

## 2023-06-24 DIAGNOSIS — E039 Hypothyroidism, unspecified: Secondary | ICD-10-CM | POA: Diagnosis present

## 2023-06-24 DIAGNOSIS — F32A Depression, unspecified: Secondary | ICD-10-CM | POA: Diagnosis present

## 2023-06-24 DIAGNOSIS — J439 Emphysema, unspecified: Secondary | ICD-10-CM | POA: Diagnosis present

## 2023-06-24 DIAGNOSIS — L03116 Cellulitis of left lower limb: Secondary | ICD-10-CM | POA: Diagnosis present

## 2023-06-24 DIAGNOSIS — F0392 Unspecified dementia, unspecified severity, with psychotic disturbance: Secondary | ICD-10-CM | POA: Diagnosis present

## 2023-06-24 DIAGNOSIS — E440 Moderate protein-calorie malnutrition: Secondary | ICD-10-CM | POA: Diagnosis present

## 2023-06-24 DIAGNOSIS — Z6841 Body Mass Index (BMI) 40.0 and over, adult: Secondary | ICD-10-CM | POA: Diagnosis not present

## 2023-06-24 DIAGNOSIS — D7581 Myelofibrosis: Secondary | ICD-10-CM | POA: Diagnosis present

## 2023-06-24 DIAGNOSIS — D45 Polycythemia vera: Secondary | ICD-10-CM | POA: Diagnosis present

## 2023-06-24 DIAGNOSIS — J9601 Acute respiratory failure with hypoxia: Secondary | ICD-10-CM | POA: Diagnosis present

## 2023-06-24 DIAGNOSIS — N3000 Acute cystitis without hematuria: Secondary | ICD-10-CM | POA: Diagnosis not present

## 2023-06-24 DIAGNOSIS — Z1152 Encounter for screening for COVID-19: Secondary | ICD-10-CM | POA: Diagnosis not present

## 2023-06-24 DIAGNOSIS — D631 Anemia in chronic kidney disease: Secondary | ICD-10-CM | POA: Diagnosis present

## 2023-06-24 LAB — RESPIRATORY PANEL BY PCR

## 2023-06-24 LAB — BLOOD CULTURE ID PANEL (REFLEXED) - BCID2

## 2023-06-24 LAB — BLOOD GAS, VENOUS
Acid-base deficit: 1.4 mmol/L (ref 0.0–2.0)
Bicarbonate: 25.2 mmol/L (ref 20.0–28.0)
O2 Saturation: 66.3 %
Patient temperature: 37
pCO2, Ven: 49 mmHg (ref 44–60)
pH, Ven: 7.32 (ref 7.25–7.43)
pO2, Ven: 42 mmHg (ref 32–45)

## 2023-06-24 LAB — BASIC METABOLIC PANEL
Anion gap: 12 (ref 5–15)
BUN: 26 mg/dL — ABNORMAL HIGH (ref 8–23)
CO2: 22 mmol/L (ref 22–32)
Calcium: 9 mg/dL (ref 8.9–10.3)
Chloride: 101 mmol/L (ref 98–111)
Creatinine, Ser: 0.87 mg/dL (ref 0.44–1.00)
GFR, Estimated: >60 mL/min (ref >60)
Glucose, Bld: 73 mg/dL (ref 70–99)
Potassium: 4.4 mmol/L (ref 3.5–5.1)
Sodium: 135 mmol/L (ref 135–145)

## 2023-06-24 LAB — CBC
HCT: 25.1 % — ABNORMAL LOW (ref 36.0–46.0)
Hemoglobin: 7.6 g/dL — ABNORMAL LOW (ref 12.0–15.0)
MCH: 26.7 pg (ref 26.0–34.0)
MCHC: 30.3 g/dL (ref 30.0–36.0)
MCV: 88.1 fL (ref 80.0–100.0)
Platelets: 904 10*3/uL (ref 150–400)
RBC: 2.85 MIL/uL — ABNORMAL LOW (ref 3.87–5.11)
RDW: 23.9 % — ABNORMAL HIGH (ref 11.5–15.5)
WBC: 13.6 10*3/uL — ABNORMAL HIGH (ref 4.0–10.5)
nRBC: 0 % (ref 0.0–0.2)

## 2023-06-24 LAB — SEDIMENTATION RATE: Sed Rate: 59 mm/h — ABNORMAL HIGH (ref 0–30)

## 2023-06-24 LAB — GLUCOSE, CAPILLARY
Glucose-Capillary: 66 mg/dL — ABNORMAL LOW (ref 70–99)
Glucose-Capillary: 79 mg/dL (ref 70–99)

## 2023-06-24 LAB — BRAIN NATRIURETIC PEPTIDE: B Natriuretic Peptide: 222.9 pg/mL — ABNORMAL HIGH (ref 0.0–100.0)

## 2023-06-24 LAB — C-REACTIVE PROTEIN: CRP: 3.6 mg/dL — ABNORMAL HIGH (ref <1.0)

## 2023-06-24 MED ORDER — METRONIDAZOLE 500 MG/100ML IV SOLN
500.0000 mg | Freq: Two times a day (BID) | INTRAVENOUS | Status: DC
  Administered 2023-06-24 – 2023-06-26 (×5): 500 mg via INTRAVENOUS
  Filled 2023-06-24 (×5): qty 100

## 2023-06-24 MED ORDER — IPRATROPIUM-ALBUTEROL 0.5-2.5 (3) MG/3ML IN SOLN
3.0000 mL | Freq: Four times a day (QID) | RESPIRATORY_TRACT | Status: DC
  Administered 2023-06-24 (×4): 3 mL via RESPIRATORY_TRACT
  Filled 2023-06-24 (×4): qty 3

## 2023-06-24 MED ORDER — VANCOMYCIN HCL IN DEXTROSE 1-5 GM/200ML-% IV SOLN: 1000.0000 mg | INTRAVENOUS | Status: DC

## 2023-06-24 MED ORDER — SODIUM CHLORIDE 0.9 % IV SOLN
2.0000 g | INTRAVENOUS | Status: DC
  Administered 2023-06-24 – 2023-06-25 (×2): 2 g via INTRAVENOUS
  Filled 2023-06-24 (×2): qty 12.5

## 2023-06-24 MED ORDER — ALBUTEROL SULFATE (2.5 MG/3ML) 0.083% IN NEBU: 2.5000 mg | INHALATION_SOLUTION | RESPIRATORY_TRACT | Status: DC | PRN

## 2023-06-24 MED ORDER — IOHEXOL 350 MG/ML SOLN
75.0000 mL | Freq: Once | INTRAVENOUS | Status: AC | PRN
  Administered 2023-06-24: 75 mL via INTRAVENOUS

## 2023-06-24 NOTE — Consult Note (Signed)
 WOC Nurse Consult Note: Reason for Consult: NPWT dressing; right foot. Patient is followed by plastic surgery s/p debridement and placement of skin substitute 1/27, 2/3, 3/10.  Wound type: hematoma Additionally has DTPI heel; Deep Tissue Pressure Injury  Pressure Injury POA: Yes Measurement: see nursing flow sheets Wound bed: see plastic notes 06/17/23  Drainage (amount, consistency, odor) see nursing notes  Periwound: intact  Dressing procedure/placement/frequency: Discussed in detail with bedside nursing and plastic surgery. Patient has weekly NPWT dressing changes per plastic surgery in their office last changed 3/18. They are aware of patient admission and we discussed options for care. Orders obtained to DC NPWT dressing at this time Apply adaptic, ky jelly, and ABD pad (plastic surgery orders). Wrap with kerlix and change daily.  Offload heels with Prevalon boots; updated orders Paint heel wounds with betadine and allow to air dry.   SNF to follow up with plastic surgery team about FU appointments once patient has returned to SNF.   Discussed POC with bedside nurse.  Re consult if needed, will not follow at this time. Thanks  Wenceslao Loper [employer], RN,CWOCN, CNS, CWON-AP [phone])

## 2023-06-24 NOTE — ED Notes (Signed)
Informed RN bed assigned 

## 2023-06-24 NOTE — Progress Notes (Signed)
 Pharmacy Antibiotic Note  Laura Wilcox is a 89 y.o. female admitted on 06/23/2023 with  wound infection .  Pharmacy has been consulted for Cefepime, Vancomycin dosing.  Plan: Cefepime 2 gm IV X 1 given in ED on 3/24 @ 1756. Cefepime 2 gm IV Q24H ordered to start on 3/25 @ 1800.  Vancomycin 1 gm IV X 1 given on 3/24 @ 2030. Vancomycin 1 gm IV Q48H ordered to start on 3/26 @ 2030.  Weight: 51.7 kg (113 lb 14.4 oz)  Temp (24hrs), Avg:97.3 F (36.3 C), Min:96.3 F (35.7 C), Max:98.2 F (36.8 C)  Recent Labs  Lab 06/18/23 1310 06/23/23 1736 06/23/23 1743 06/23/23 2241  WBC 11.8* 13.4*  --   --   CREATININE  --   --   --  0.90  LATICACIDVEN  --   --  1.8 1.1    Estimated Creatinine Clearance: 27.5 mL/min (by C-G formula based on SCr of 0.9 mg/dL).    Allergies  Allergen Reactions   Bactrim [Sulfamethoxazole-Trimethoprim]     Due to kidney disease    Epinephrine Other (See Comments)   Other     Per daughter RUE BP significantly higher than LUE    Antimicrobials this admission:   >>    >>   Dose adjustments this admission:   Microbiology results:  BCx:   UCx:    Sputum:    MRSA PCR:   Thank you for allowing pharmacy to be a part of this patient's care.  Laura Wilcox 06/24/2023 1:37 AM

## 2023-06-24 NOTE — ED Notes (Signed)
 Pt son asking about wound care for mother. Informed son the wound vac was D/C and a wound care RN would come by to help pt. Supplies needing to be ordered to facilitate wound care.

## 2023-06-24 NOTE — Progress Notes (Signed)
 Progress Note   Patient: Laura Wilcox BJY:[id number] DOB: November 12, 1926 DOA: 06/23/2023     0 DOS: the patient was seen and examined on 06/24/2023   Brief hospital course: HPI on admission: "Laura Wilcox is a 89 y.o. female with medical history significant of dementia, hypothyroidism, depression with anxiety, CKD-3B, skin cancer, UTI, myelofibrosis and polycythemia rubra vera, chronic left foot wound after hematoma (VAC), who presents with altered mental status. ..." See H&P for full HPI on admission & ED course.  Patient was admitted on evening of 06/23/2023 for further evaluation and management of left lower extremity wound infection and UTI.  Further hospital course and management as outlined below.   Assessment and Plan:  Wound infection and wound of left lower extremity: Patient has WBC 13.5, but no fever, lactic acid normal, clinically does not seem to have sepsis. --Antibiotics: Vancomycin, cefepime and Flagyl - continue  --Follow-up blood culture --On IV fluids 75 cc/h -- continue for maintenance hydration for now --Trend inflammatory markers --Wound care consult   UTI (urinary tract infection): --On broad antibiotics as above --Follow urine culture   Acute metabolic encephalopathy: Likely multifactorial etiology, including UTI, hypoxia, ongoing wound infection -Frequent neurocheck -Fall precaution -Follow-up CT scan of head -Hold all oral medications until mental status improves   Acute respiratory failure with hypoxia: Etiology is not clear.   Resolved. Chest x-ray negative for infiltration.   Negative PCR for COVID flu and RSV. CTA negative for PE or other acute findings. Shows emphysema. BNP only mildly elevated 222.0 3/25 -- normal respiratory effort when seen on rounds, 100% on room air --Bronchodilators --Supplement O2 per protocol to maintain spO2 > 88%, wean as tolerated   Chronic kidney disease, stage 3b - stable --Monitor BMP    Hypothyroidism --Resume Synthroid when AMS improved & safe for PO meds   Anemia in chronic kidney disease:  Hemoglobin stable --Monitor CBC   Polycythemia rubra vera (HCC) and Myelofibrosis (HCC) --Follow-up with Dr. Cathie Hoops of oncology --Temporarily hold Jakafi due to altered mental status   Depression with anxiety --Holding oral medications due to AMS, resume when appropriate   Protein-calorie malnutrition, moderate (HCC):  Body mass index is 22.24 kg/m. --Consulted dietitian        Subjective: Pt seen in ED holding for a bed, son at bedside.  Pt remains altered from baseline per son, but has improved a little since admission.  Pt wakes to voice and denies complaints for me including trouble breathing, fevers, pain.   Son reports being quite unhappy with care at [facility] -- staff do not assist patient to eat and drink, not sure if she's been able to do those things.  They also did not report changes in her condition and mental status until a few days after onset.    Physical Exam: Vitals:   06/24/23 1230 06/24/23 1300 06/24/23 1330 06/24/23 1331  BP: (!) 115/52 122/76 139/61   Pulse: 69 63 74   Resp: 17 17 (!) 27   Temp:    98.4 F (36.9 C)  TempSrc:    Axillary  SpO2: 100% 100% 100%   Weight:      Height:       General exam: somnolent, wakes to voice, no acute distress, frail eldery HEENT: moist mucus membranes, hard of hearing  Respiratory system: CTAB, no wheezes, rales or rhonchi, normal respiratory effort. Cardiovascular system: normal S1/S2, RRR, no JVD, murmurs, rubs, gallops,  no pedal edema.   Gastrointestinal system: soft, NT, ND, no HSM  felt, +bowel sounds. Central nervous system: exam limited by AMS and dementia, but pt follows commands, grip strength 5/5 symmetric, moves all extremities Extremities: no peripheral edema, wound vac on left foot Skin: dry, intact, normal temperature, no rashes seen on visualized skin Psychiatry: normal mood, congruent affect,  abnormal judgement and insight due to dementia    Data Reviewed:  Notable labs --  Normal BMP except BUN 26 WBC 13.6, Hbg 7.6 from 9.6 (?dilution) CRP 3.6 Platelets 904k   Family Communication: son at bedside on rounds  Disposition: Status is: Inpatient Remains inpatient appropriate because: on IV antibiotics pending cultures   Planned Discharge Destination: Skilled nursing facility    Time spent: 52 minutes including time at bedside and in coordination of care with staff and consultants  Author: Pennie Banter, DO 06/24/2023 2:34 PM  For on call review [email or web address].

## 2023-06-24 NOTE — Progress Notes (Signed)
 PHARMACY - PHYSICIAN COMMUNICATION CRITICAL VALUE ALERT - BLOOD CULTURE IDENTIFICATION (BCID)  Laura Wilcox is an 89 y.o. female who presented to [facility] on 06/23/2023 with a chief complaint of altered mental status. They are currently on vancomycin, flagyl and cefepime for a wound infection.   Assessment: 3/24 blood cultures; 1/4, aerobic bottle, Staphylococcus epidermidis, Methicillin resistance mecA/C   Name of physician (or Provider) Contacted: Dr. Verdene Lennert  Current antibiotics:  Cefepime 2g Q24H Flagyl 500 Q12H Vancomycin 1000 Q48H  Changes to prescribed antibiotics recommended:  Patient is on recommended antibiotics - No changes needed Consider following for infection vs contamination   Results for orders placed or performed during the hospital encounter of 06/23/23  Blood Culture ID Panel (Reflexed) (Collected: 06/23/2023  5:36 PM)  Result Value Ref Range   Enterococcus faecalis NOT DETECTED NOT DETECTED   Enterococcus Faecium NOT DETECTED NOT DETECTED   Listeria monocytogenes NOT DETECTED NOT DETECTED   Staphylococcus species DETECTED (A) NOT DETECTED   Staphylococcus aureus (BCID) NOT DETECTED NOT DETECTED   Staphylococcus epidermidis DETECTED (A) NOT DETECTED   Staphylococcus lugdunensis NOT DETECTED NOT DETECTED   Streptococcus species NOT DETECTED NOT DETECTED   Streptococcus agalactiae NOT DETECTED NOT DETECTED   Streptococcus pneumoniae NOT DETECTED NOT DETECTED   Streptococcus pyogenes NOT DETECTED NOT DETECTED   A.calcoaceticus-baumannii NOT DETECTED NOT DETECTED   Bacteroides fragilis NOT DETECTED NOT DETECTED   Enterobacterales NOT DETECTED NOT DETECTED   Enterobacter cloacae complex NOT DETECTED NOT DETECTED   Escherichia coli NOT DETECTED NOT DETECTED   Klebsiella aerogenes NOT DETECTED NOT DETECTED   Klebsiella oxytoca NOT DETECTED NOT DETECTED   Klebsiella pneumoniae NOT DETECTED NOT DETECTED   Proteus species NOT DETECTED NOT DETECTED    Salmonella species NOT DETECTED NOT DETECTED   Serratia marcescens NOT DETECTED NOT DETECTED   Haemophilus influenzae NOT DETECTED NOT DETECTED   Neisseria meningitidis NOT DETECTED NOT DETECTED   Pseudomonas aeruginosa NOT DETECTED NOT DETECTED   Stenotrophomonas maltophilia NOT DETECTED NOT DETECTED   Candida albicans NOT DETECTED NOT DETECTED   Candida auris NOT DETECTED NOT DETECTED   Candida glabrata NOT DETECTED NOT DETECTED   Candida krusei NOT DETECTED NOT DETECTED   Candida parapsilosis NOT DETECTED NOT DETECTED   Candida tropicalis NOT DETECTED NOT DETECTED   Cryptococcus neoformans/gattii NOT DETECTED NOT DETECTED   Methicillin resistance mecA/C DETECTED (A) NOT DETECTED   Effie Shy, PharmD Pharmacy Resident  06/24/2023 8:26 PM

## 2023-06-24 NOTE — ED Notes (Signed)
Pt connected to monitor.

## 2023-06-24 NOTE — TOC Initial Note (Addendum)
 Transition of Care [facility]) - Initial/Assessment Note    Patient Details  Name: Laura Wilcox MRN: [id number] Date of Birth: October 21, 1926  [facility] CM/SW Contact:    Colin Broach, LCSW Phone Number: 06/24/2023, 2:03 PM  Clinical Narrative:                 CSW spoke with patient's daugter, Laura Wilcox [phone]) via phone.  CSW introduced self and reason for call.  Patient admitted from [facility].  Laura Wilcox states that, prior to patient going to [facility], she was with [facility] in in [place].  Due to the level of deconditioning of patient, she is no longer with [facility].  However, Laura Wilcox states that she's been told that if patient can go back to rehab and gain more strength, they would possibly take her back. PT to be consulted for possible return to SNF.  Laura Wilcox doesn't want patient to return to [facility]. Patient's PCP is no longer Smiley Houseman,  Laura Wilcox is in the process of finding a new PCP of patient.    Laura Wilcox would like for her brother, Laura Wilcox, to be called in the event that she can't be reached by phone.  Kirby's number is [phone].  TOC to continue to follow for discharge planning.  CSW asked MD to place PT consult.  Will await their recommendation.        Patient Goals and CMS Choice            Expected Discharge Plan and Services                                              Prior Living Arrangements/Services                       Activities of Daily Living   ADL Screening (condition at time of admission) Independently performs ADLs?: No Does the patient have a NEW difficulty with bathing/dressing/toileting/self-feeding that is expected to last >3 days?: No Does the patient have a NEW difficulty with getting in/out of bed, walking, or climbing stairs that is expected to last >3 days?: No Does the patient have a NEW difficulty with communication that is expected to last >3 days?: No Is the patient deaf or have difficulty  hearing?: No Does the patient have difficulty seeing, even when wearing glasses/contacts?: No Does the patient have difficulty concentrating, remembering, or making decisions?: Yes  Permission Sought/Granted                  Emotional Assessment              Admission diagnosis:  UTI (urinary tract infection) [N39.0] Acute respiratory failure with hypoxia (HCC) [J96.01] Patient Active Problem List   Diagnosis Date Noted   Wound of left lower extremity 06/23/2023   Hypothyroidism 06/23/2023   Depression with anxiety 06/23/2023   Acute respiratory failure with hypoxia (HCC) 06/23/2023   Wound infection 06/23/2023   Leg wound, left 06/09/2023   Wound of left leg 04/21/2023   Cellulitis 04/13/2023   Hematoma of left ankle 03/31/2023   Hematoma 03/31/2023   SCC (squamous cell carcinoma) 03/19/2022   Skin tear of lower leg without complication, right, initial encounter 09/24/2021   Sepsis (HCC) 09/24/2021   Leg wound, right 09/24/2021   Encounter for antineoplastic chemotherapy 09/21/2021   Senile dementia with acute  confusional state, without behavioral disturbance (HCC) 06/26/2021   Depression 06/26/2021   Weakness 06/26/2021   UTI (urinary tract infection) 06/25/2021   Acute metabolic encephalopathy 10/22/2020   Myelofibrosis (HCC) 01/31/2020   Other pancytopenia (HCC) 01/31/2020   Moderate protein-calorie malnutrition (HCC)    Squamous cell cancer of skin of left cheek 01/16/2020   Chronic kidney disease, stage 3b (HCC) 03/11/2019   Secondary hyperparathyroidism of renal origin (HCC) 03/11/2019   Anemia in chronic kidney disease 03/11/2019   Elevated ferritin 02/18/2018   Goals of care, counseling/discussion 07/09/2017   Polycythemia rubra vera (HCC) 06/02/2017   Advanced care planning/counseling discussion 06/02/2017   Age-related cognitive decline 06/02/2017   PCP:  Smiley Houseman, NP Pharmacy:   [facility] [address] [place] [address] Phone: [phone] Fax: [phone]     Social Drivers of Health (SDOH) Social History: SDOH Screenings   Food Insecurity: Patient Unable To Answer (06/24/2023)  Housing: Patient Unable To Answer (06/24/2023)  Transportation Needs: Patient Unable To Answer (06/24/2023)  Utilities: Patient Unable To Answer (06/24/2023)  Alcohol Screen: Low Risk  (06/18/2021)  Depression (PHQ2-9): High Risk (06/18/2021)  Social Connections: Patient Unable To Answer (06/24/2023)  Tobacco Use: Medium Risk (06/24/2023)   SDOH Interventions:     Readmission Risk Interventions    06/24/2023    2:03 PM 06/26/2021    4:26 PM  Readmission Risk Prevention Plan  Transportation Screening Complete Complete  PCP or Specialist Appt within 5-7 Days Complete Complete  Home Care Screening Complete Complete  Medication Review (RN CM) Complete Complete

## 2023-06-24 NOTE — Consult Note (Cosign Needed)
 Palliative Medicine [facility] at [facility] [phone] [phone]   Name: Laura Wilcox Date: 06/24/2023 MRN: [id number]  DOB: 05-28-1926  Patient Care Team: Smiley Houseman, NP as PCP - General (Nurse Practitioner) Mady Haagensen, MD (Internal Medicine) Rickard Patience, MD as Consulting Physician (Oncology)    REASON FOR CONSULTATION: Laura Wilcox is a 89 y.o. female with multiple medical problems including dementia, CKD 3B, PAD, chronic left foot wound, myelofibrosis, and polycythemia vera on Jakafi.  Patient has had multiple hospitalizations for surgical debridement of her left foot wound.  Is now hospitalized with UTI and wound infection.  SOCIAL HISTORY:     reports that she has quit smoking. Her smoking use included cigarettes. She has never used smokeless tobacco. She reports that she does not drink alcohol and does not use drugs.  Patient is widowed.  She has a daughter who is her primary caregiver.  She has a son who lives in [place]. Patient as a resident at [facility] but has most recently been at [facility].   ADVANCE DIRECTIVES:  On file  CODE STATUS: DNR  PAST MEDICAL HISTORY: Past Medical History:  Diagnosis Date   Acute metabolic encephalopathy 10/22/2020   Complication of anesthesia    pt gets confused a day after receiving anesthesia per the daughter   Confusion 12/30/2018   Dementia (HCC)    Hypothyroidism    Polycythemia    SCC (squamous cell carcinoma) 04/11/2021   R med dorsum hand - ED&C SCCIS   SCC (squamous cell carcinoma) 10/09/2021   left lateral eye, EDC   SCC (squamous cell carcinoma) 01/28/2022   left lateral eye, recurrent, EDC. 5FU/Calcipotriene cream.   SCC (squamous cell carcinoma) 01/28/2022   in situ, left medial cheek, 5FU/Calcipotriene Cr   SCC (squamous cell carcinoma) 02/26/2022   SCC IS, Right Ulnar Dorsal Hand, 5FU/Calcipotriene cream   SCC (squamous cell  carcinoma) 09/03/2022   R ankle, EDC   SCC (squamous cell carcinoma) 01/20/2023   SCC IS Left medial lower leg, EDC   Squamous cell carcinoma of skin 09/06/2020   R thumb - ED&C   Urinary tract infection without hematuria 01/02/2020   Urticaria 06/02/2017    PAST SURGICAL HISTORY:  Past Surgical History:  Procedure Laterality Date   ABDOMINAL AORTOGRAM W/LOWER EXTREMITY N/A 04/15/2023   Procedure: ABDOMINAL AORTOGRAM W/LOWER EXTREMITY;  Surgeon: Nada Libman, MD;  [facility]: [facility];  Service: Cardiovascular;  Laterality: N/A;   ABDOMINAL HYSTERECTOMY     complete   APPLICATION OF WOUND VAC Left 04/21/2023   Procedure: APPLICATION OF WOUND VAC;  Surgeon: Peggye Form, DO;  Location: [facility] OR;  Service: Plastics;  Laterality: Left;   APPLICATION OF WOUND VAC Left 04/28/2023   Procedure: WOUND VAC CHANGE;  Surgeon: Peggye Form, DO;  Location: [facility] OR;  Service: Plastics;  Laterality: Left;   APPLICATION OF WOUND VAC N/A 05/05/2023   Procedure: WOUND VAC CHANGE APPLICATION OF SKIN SUBSTITUTE;  Surgeon: Peggye Form, DO;  Location: [facility] OR;  Service: Plastics;  Laterality: N/A;   APPLICATION OF WOUND VAC Left 06/09/2023   Procedure: APPLICATION, WOUND VAC;  Surgeon: Peggye Form, DO;  Location: [facility] OR;  Service: Plastics;  Laterality: Left;   HEMATOMA EVACUATION Left 04/02/2023   Procedure: EVACUATION ANKLE HEMATOMA;  Surgeon: Nada Libman, MD;  Location: [facility] ORS;  Service: Vascular;  Laterality: Left;   I & D EXTREMITY Left 06/09/2023  Procedure: irrigation and debridement of left lower extremity wound with placement of myriad and placement of wound vac;  Surgeon: Peggye Form, DO;  Location: [facility] OR;  Service: Plastics;  Laterality: Left;   INCISION AND DRAINAGE OF WOUND Left 04/21/2023   Procedure: IRRIGATION AND DEBRIDEMENT WOUND;  Surgeon: Peggye Form, DO;  Location: [facility] OR;  Service: Plastics;  Laterality: Left;   PERIPHERAL  VASCULAR BALLOON ANGIOPLASTY  04/15/2023   Procedure: PERIPHERAL VASCULAR BALLOON ANGIOPLASTY;  Surgeon: Nada Libman, MD;  [facility]: [facility];  Service: Cardiovascular;;   PERIPHERAL VASCULAR INTERVENTION  04/15/2023   Procedure: PERIPHERAL VASCULAR INTERVENTION;  Surgeon: Nada Libman, MD;  [facility];  Service: Cardiovascular;;    HEMATOLOGY/ONCOLOGY HISTORY:  Oncology History Overview Note  Maleeka Wilcox is a 89 y.o. female with polycythemia rubra vera and secondary myelofibrosis.  She presented in 04/2002 with thrombocytosis (835,000).  JAK2 V617F was positive on 05/20/2012.   Over the past year, hematocrit has ranged 37.5 - 42.4, hemoglobin 12.1 - 13.4, platelets 458,000 - 569,000, WBC 10,500 - 15,700.  Creatinine has been 1.0 - 1.2.   Bone marrow  on 04/15/2002 revealed a normocellular marrow for age with trilineage hematopoiesis and mild megakaryocytic hyperplasia with focal atypia.  There was no morphologic evidence of a myeloproliferative disorder.  Iron stains were inadequate for evaluation of storage iron.  There was no increase in reticulin fibers.  She has been on agrylin since 2012.   Bone marrow on 09/25/2012 revealed a persistent myeloproliferative disorder s/p Agrylin.  The current features were best classified as persistent myeloproliferative neoplasm (MPN) JAK2 V617F mutation positive.  Marrow was hypercellular for age (30-50%) with increased trilineage hematopoiesis and markedly increased atypical megakaryopoiesis with frequent clustering.  There were no increased blasts.  Iron stores were absent.  There was moderate to severe myelofibrosis (grade MF 2-3).  Differential included ET, PV, primary myelofibrosis, and MPN, unclassifiable.  Cytogenetics were normal (46, XX).   She developed pancytopenia in 12/2015.  Etiology is unclear (medication confusion or hydroxyurea + Agrylin).   She received Procrit 40,000 units (intermittently 09/2012 -  06/2016), Venofer (09/2012 - 10/2012), and Infed (12/2015 - 03/2016).  She received Neupogen during a period of neutropenia. She was anagrelide from 2004 - 08/13/2017.  She began [place] on 08/14/2017.  Jakafi was increased from 5 mg BID to 10 mg BID on 06/17/2018.  Jakafi was increased to 15 mg BID on 08/03/2018.   She began a phlebotomy program on 08/06/2017.  She undergoes small volume (150 cc with replacement of 150 cc NS) if her HCT > 42.   Ferritin has been followed: 529 on 09/03/2017, 1020 on 12/03/2017, 423 on 02/18/2018, and 858 on 11/17/2018.  Iron saturation was 37% and TIBC 270 on 11/17/2018.  B12 was 477 and folate 36 on 11/17/2018.   She was admitted to [facility] from 12/17/2018 - 12/18/2018.  She received 1 unit of PRBCs.  CBC at discharge included a hematocrit of 29.0, hemoglobin 9.4, platelets 364,000, and WBC 10,200.    Polycythemia rubra vera (HCC)  06/02/2017 Initial Diagnosis   Polycythemia rubra vera (HCC)     ALLERGIES:  is allergic to bactrim [sulfamethoxazole-trimethoprim], epinephrine, and other.  MEDICATIONS:  Current Facility-Administered Medications  Medication Dose Route Frequency Provider Last Rate Last Admin   0.9 %  sodium chloride infusion   Intravenous Continuous Lorretta Harp, MD 75 mL/hr at 06/24/23 1323 New Bag at 06/24/23 1323   acetaminophen (TYLENOL) tablet  650 mg  650 mg Oral Q6H PRN Lorretta Harp, MD   650 mg at 06/23/23 2340   albuterol (PROVENTIL) (2.5 MG/3ML) 0.083% nebulizer solution 2.5 mg  2.5 mg Nebulization Q4H PRN Lorretta Harp, MD       ceFEPIme (MAXIPIME) 2 g in sodium chloride 0.9 % 100 mL IVPB  2 g Intravenous Q24H Lorretta Harp, MD   Stopped at 06/24/23 1748   heparin injection 5,000 Units  5,000 Units Subcutaneous Q8H Lorretta Harp, MD   5,000 Units at 06/24/23 1330   ipratropium-albuterol (DUONEB) 0.5-2.5 (3) MG/3ML nebulizer solution 3 mL  3 mL Nebulization Q6H Lorretta Harp, MD   3 mL at 06/24/23 1330   metroNIDAZOLE (FLAGYL) IVPB 500 mg  500 mg Intravenous  Willette Pa, MD 100 mL/hr at 06/24/23 1809 500 mg at 06/24/23 1809   ondansetron (ZOFRAN) injection 4 mg  4 mg Intravenous Q8H PRN Lorretta Harp, MD       Melene Muller ON 06/25/2023] vancomycin (VANCOCIN) IVPB 1000 mg/200 mL premix  1,000 mg Intravenous Q48H Lorretta Harp, MD       Current Outpatient Medications  Medication Sig Dispense Refill   acetaminophen (TYLENOL) 325 MG tablet Take 650 mg by mouth See admin instructions. Take 650 mg once daily, may take 650 mg dose every 6 hours as needed for mild pain     Amino Acids-Protein Hydrolys (FEEDING SUPPLEMENT, PRO-STAT SUGAR FREE 64,) LIQD Take by mouth 2 (two) times daily.     ammonium lactate (AMLACTIN) 12 % cream Apply topically qd/bid to dry areas at lower legs and arms (Patient taking differently: Apply 1 Application topically 2 (two) times daily. Apply topically qd/bid to dry areas at lower legs and arms) 385 g 2   ascorbic acid (VITAMIN C) 500 MG tablet Take 500 mg by mouth daily.     aspirin EC 81 MG tablet Take 81 mg by mouth daily. Swallow whole.     calcitRIOL (ROCALTROL) 0.25 MCG capsule Take 0.25 mcg by mouth every Monday, Wednesday, and Friday.     Cholecalciferol (VITAMIN D3) 25 MCG (1000 UT) CAPS Take 1,000 Units by mouth daily.     citalopram (CELEXA) 10 MG tablet Take 10 mg by mouth daily.     clotrimazole-betamethasone (LOTRISONE) cream Apply 1 Application topically 2 (two) times daily.     ferrous sulfate 325 (65 FE) MG EC tablet Take 325 mg by mouth daily.     hydrocortisone 1 % ointment Apply 1 Application topically daily.     hydrocortisone 2.5 % cream Apply topically twice daily to aa of right cheek as needed for itch (Patient taking differently: Apply 1 Application topically every 12 (twelve) hours as needed (itching of right cheek).) 30 g 3   hydrOXYzine (ATARAX) 25 MG tablet Take 25 mg by mouth every 8 (eight) hours as needed for itching.     levothyroxine (SYNTHROID) 25 MCG tablet Take 25 mcg by mouth daily.     Multiple  Vitamin (MULTIVITAMIN WITH MINERALS) TABS tablet Take 1 tablet by mouth daily.     naloxone (NARCAN) nasal spray 4 mg/0.1 mL Place 1 spray into the nose 3 (three) times daily as needed (opioid overdose).     oxyCODONE (OXY IR/ROXICODONE) 5 MG immediate release tablet Take 1 tablet (5 mg total) by mouth every 6 (six) hours as needed for severe pain (pain score 7-10). 30 tablet 0   polyethylene glycol (MIRALAX / GLYCOLAX) 17 g packet Take 17 g by mouth daily as needed for  mild constipation. (Patient taking differently: Take 17 g by mouth daily.)     ruxolitinib phosphate (JAKAFI) 5 MG tablet Take 10 mg by mouth in the morning and 5 mg in the evening (Patient taking differently: Take 10 mg by mouth in the morning.) 90 tablet 1   traZODone (DESYREL) 50 MG tablet Take 25 mg by mouth at bedtime as needed for sleep.     vitamin B-12 (CYANOCOBALAMIN) 500 MCG tablet Take 1,000 mcg by mouth daily.      VITAL SIGNS: BP (!) 106/55   Pulse 89   Temp 98.4 F (36.9 C) (Axillary)   Resp (!) 21   Ht 5' (1.524 m)   Wt 113 lb 14.4 oz (51.7 kg)   SpO2 96%   BMI 22.24 kg/m  Filed Weights   06/24/23 0121  Weight: 113 lb 14.4 oz (51.7 kg)    Estimated body mass index is 22.24 kg/m as calculated from the following:   Height as of this encounter: 5' (1.524 m).   Weight as of this encounter: 113 lb 14.4 oz (51.7 kg).  LABS: CBC:    Component Value Date/Time   WBC 13.6 (H) 06/24/2023 0141   HGB 7.6 (L) 06/24/2023 0141   HGB 9.2 (L) 06/18/2023 1310   HGB 8.1 (L) 06/18/2021 1514   HCT 25.1 (L) 06/24/2023 0141   HCT 25.2 (L) 06/18/2021 1514   PLT 904 (HH) 06/24/2023 0141   PLT 783 (H) 06/18/2023 1310   PLT 452 (H) 06/18/2021 1514   MCV 88.1 06/24/2023 0141   MCV 91 06/18/2021 1514   NEUTROABS 9.6 (H) 06/23/2023 1736   NEUTROABS 6.5 06/18/2021 1514   LYMPHSABS 1.6 06/23/2023 1736   LYMPHSABS 1.0 06/18/2021 1514   MONOABS 0.9 06/23/2023 1736   EOSABS 0.7 (H) 06/23/2023 1736   EOSABS 0.4 06/18/2021  1514   BASOSABS 0.2 (H) 06/23/2023 1736   BASOSABS 0.3 (H) 06/18/2021 1514   Comprehensive Metabolic Panel:    Component Value Date/Time   NA 135 06/24/2023 0141   NA 134 06/18/2021 1514   K 4.4 06/24/2023 0141   CL 101 06/24/2023 0141   CO2 22 06/24/2023 0141   BUN 26 (H) 06/24/2023 0141   BUN 19 06/18/2021 1514   CREATININE 0.87 06/24/2023 0141   CREATININE 0.93 06/05/2023 1326   GLUCOSE 73 06/24/2023 0141   CALCIUM 9.0 06/24/2023 0141   AST 23 06/23/2023 2241   AST 15 06/05/2023 1326   ALT 10 06/23/2023 2241   ALT 12 06/05/2023 1326   ALKPHOS 65 06/23/2023 2241   BILITOT 0.9 06/23/2023 2241   BILITOT 0.6 06/05/2023 1326   PROT 5.9 (L) 06/23/2023 2241   PROT 5.8 (L) 06/18/2021 1514   ALBUMIN 2.9 (L) 06/23/2023 2241   ALBUMIN 3.6 06/18/2021 1514    RADIOGRAPHIC STUDIES: CT HEAD WO CONTRAST ( ) Result Date: 06/24/2023 CLINICAL DATA:  Altered mental status for 2 days EXAM: CT HEAD WITHOUT CONTRAST TECHNIQUE: Contiguous axial images were obtained from the base of the skull through the vertex without intravenous contrast. RADIATION DOSE REDUCTION: This exam was performed according to the departmental dose-optimization program which includes automated exposure control, adjustment of the mA and/or kV according to patient size and/or use of iterative reconstruction technique. COMPARISON:  03/07/2023 FINDINGS: Brain: No evidence of acute infarction, hemorrhage, hydrocephalus, extra-axial collection or mass lesion/mass effect. Mild atrophic changes and chronic white matter ischemic changes are noted. Vascular: No hyperdense vessel or unexpected calcification. Skull: Normal. Negative for fracture or focal lesion.  Sinuses/Orbits: No acute finding. Other: None. IMPRESSION: Atrophic and ischemic changes stable from the prior exam. Electronically Signed   By: Alcide Clever M.D.   On: 06/24/2023 03:01   CT Angio Chest Pulmonary Embolism (PE) W or WO Contrast Result Date: 06/24/2023 CLINICAL  DATA:  Pulmonary embolus suspected with high probability. Altered mental status. Recent discharge with pneumonia. EXAM: CT ANGIOGRAPHY CHEST WITH CONTRAST TECHNIQUE: Multidetector CT imaging of the chest was performed using the standard protocol during bolus administration of intravenous contrast. Multiplanar CT image reconstructions and MIPs were obtained to evaluate the vascular anatomy. RADIATION DOSE REDUCTION: This exam was performed according to the departmental dose-optimization program which includes automated exposure control, adjustment of the mA and/or kV according to patient size and/or use of iterative reconstruction technique. CONTRAST:  75mL OMNIPAQUE IOHEXOL 350 MG/ML SOLN COMPARISON:  Chest radiograph 06/23/2023 and 04/27/2023 FINDINGS: Cardiovascular: Technically adequate study with good opacification of the central and segmental pulmonary arteries. Mild motion artifact. No focal filling defects in the pulmonary arteries. No evidence of significant pulmonary embolus. Normal heart size. No pericardial effusions. Calcification of the aorta and coronary arteries. No aortic aneurysm or dissection. Mediastinum/Nodes: Small esophageal hiatal hernia. Esophagus is decompressed. No significant lymphadenopathy. Thyroid gland is unremarkable. Lungs/Pleura: Emphysematous changes throughout the lungs. Scarring in the apices and bases. No airspace disease or consolidation is seen. No pleural effusion or pneumothorax. Upper Abdomen: No acute abnormalities. Musculoskeletal: Degenerative changes in the spine. No acute bony abnormalities. Review of the MIP images confirms the above findings. IMPRESSION: 1. No evidence of significant pulmonary embolus. 2. Prominent emphysematous changes in the lungs. No evidence of active pulmonary disease. 3. Aortic atherosclerosis. Electronically Signed   By: Burman Nieves M.D.   On: 06/24/2023 03:00   DG Chest Port 1 View Result Date: 06/23/2023 CLINICAL DATA:  Possible  sepsis EXAM: PORTABLE CHEST 1 VIEW COMPARISON:  04/27/2023 FINDINGS: Cardiac shadow is stable. Previously seen hiatal hernia is again identified and stable. Lungs are well aerated bilaterally. No focal infiltrate or effusion is seen. No acute bony abnormality is noted. Old healed rib fractures are noted on the right. IMPRESSION: Hiatal hernia.  No acute abnormality noted Electronically Signed   By: Alcide Clever M.D.   On: 06/23/2023 20:21   VAS [place] ABI WITH/WO TBI Result Date: 06/04/2023  LOWER EXTREMITY DOPPLER STUDY Patient Name:  EVA GRIFFO  Date of Exam:   06/03/2023 Medical Rec #: [id number]         Accession #:    [id number] Date of Birth: 08-31-1926         Patient Gender: F Patient Age:   26 years Exam Location:  [facility] Procedure:      VAS [place] ABI WITH/WO TBI Referring Phys: Coral Else --------------------------------------------------------------------------------  Other Factors: Dementia.  Vascular Interventions: 04/15/23 - left superficial femoral artery - popliteal                         artery stent. Limitations: Today's exam was limited due to patient positioning, patient              intolerant to cuff pressure, involuntary patient movement and              bandages. Performing Technologist: Cannelton Sink Sturdivant-Jones RDMS, RVT  Examination Guidelines: A complete evaluation includes at minimum, Doppler waveform signals and systolic blood pressure reading at the level of bilateral brachial, anterior tibial, and posterior tibial arteries, when vessel segments are  accessible. Bilateral testing is considered an integral part of a complete examination. Photoelectric Plethysmograph (PPG) waveforms and toe systolic pressure readings are included as required and additional duplex testing as needed. Limited examinations for reoccurring indications may be performed as noted.  ABI Findings: +---------+------------------+-----+-----------------+--------+ Right    Rt Pressure (mmHg)IndexWaveform          Comment  +---------+------------------+-----+-----------------+--------+ Brachial 128                    triphasic                 +---------+------------------+-----+-----------------+--------+ PTA      56                0.44 monophasic                +---------+------------------+-----+-----------------+--------+ DP       87                0.68 monophasic                +---------+------------------+-----+-----------------+--------+ Great Toe                       severely dampened         +---------+------------------+-----+-----------------+--------+ +---------+------------------+-----+-----------------+--------------------+ Left     Lt Pressure (mmHg)IndexWaveform         Comment              +---------+------------------+-----+-----------------+--------------------+ Brachial 78                                      greater than 20 mmHg +---------+------------------+-----+-----------------+--------------------+ PTA      79                0.62 monophasic                            +---------+------------------+-----+-----------------+--------------------+ DP                                               Not obtainable       +---------+------------------+-----+-----------------+--------------------+ Great Toe                       severely dampened                     +---------+------------------+-----+-----------------+--------------------+ +-------+-----------+-----------+------------+------------+ ABI/TBIToday's ABIToday's TBIPrevious ABIPrevious TBI +-------+-----------+-----------+------------+------------+ Right  0.68                                           +-------+-----------+-----------+------------+------------+ Left   0.62                                           +-------+-----------+-----------+------------+------------+  Suboptimal study due to patient's condition.  Summary: Right: Resting right ankle-brachial index  indicates moderate right lower extremity arterial disease. Left: Resting left ankle-brachial index indicates moderate left lower extremity arterial disease. *See table(s) above for measurements and observations.  Electronically signed by Sherald Hess MD on 06/04/2023  at 7:26:30 AM.    Final    VAS [place] LOWER EXTREMITY ARTERIAL DUPLEX Result Date: 06/04/2023 LOWER EXTREMITY ARTERIAL DUPLEX STUDY Patient Name:  BRITNE BORELLI  Date of Exam:   06/03/2023 Medical Rec #: [id number]         Accession #:    [id number] Date of Birth: 11-15-1926         Patient Gender: F Patient Age:   3 years Exam Location:  [facility] Procedure:      VAS [place] LOWER EXTREMITY ARTERIAL DUPLEX Referring Phys: Coral Else --------------------------------------------------------------------------------  Indications: Peripheral artery disease.  Vascular Interventions: Left SFA-PopA stenting on 04/15/23. Current ABI:            Rt 0.68, Lt 0.62 Limitations: Patient positioning, intolerant to cuff pressure, involuntary              patient movement and bandages. Performing Technologist: Laurel Sink Sturdivant-Jones RDMS, RVT  Examination Guidelines: A complete evaluation includes B-mode imaging, spectral Doppler, color Doppler, and power Doppler as needed of all accessible portions of each vessel. Bilateral testing is considered an integral part of a complete examination. Limited examinations for reoccurring indications may be performed as noted.   +----------+--------+-----+--------+----------+------------------+ LEFT      PSV cm/sRatioStenosisWaveform  Comments           +----------+--------+-----+--------+----------+------------------+ CFA Distal137                  biphasic                     +----------+--------+-----+--------+----------+------------------+ DFA       51                   monophasic                   +----------+--------+-----+--------+----------+------------------+ SFA Prox                                  Stent              +----------+--------+-----+--------+----------+------------------+ SFA Mid                                  Stent              +----------+--------+-----+--------+----------+------------------+ SFA Distal                               Stent              +----------+--------+-----+--------+----------+------------------+ POP Prox  78                   monophasic                   +----------+--------+-----+--------+----------+------------------+ POP Distal40                   monophasic                   +----------+--------+-----+--------+----------+------------------+ TP Trunk  62                   monophasic                   +----------+--------+-----+--------+----------+------------------+ ATA Prox  Not imaged/bandage +----------+--------+-----+--------+----------+------------------+ ATA Mid   58                   monophasic                   +----------+--------+-----+--------+----------+------------------+ ATA Distal22                   monophasic                   +----------+--------+-----+--------+----------+------------------+ PTA Distal39                   monophasic                   +----------+--------+-----+--------+----------+------------------+ PERO Prox                                Not visualized     +----------+--------+-----+--------+----------+------------------+  Left Stent(s): +---------------+--------+--------+----------+--------+ SFA - POP A    PSV cm/sStenosisWaveform  Comments +---------------+--------+--------+----------+--------+ Prox to Stent  101                                +---------------+--------+--------+----------+--------+ Proximal Stent 60              monophasic         +---------------+--------+--------+----------+--------+ Mid Stent      58              monophasic          +---------------+--------+--------+----------+--------+ Distal Stent   61              monophasic         +---------------+--------+--------+----------+--------+ Distal to Stent37              monophasic         +---------------+--------+--------+----------+--------+    Summary: Left: Patent left superficial femoral-popliteal artery stent.   See table(s) above for measurements and observations. Electronically signed by Sherald Hess MD on 06/04/2023 at 7:25:12 AM.    Final     PERFORMANCE STATUS (ECOG) : 4 - Bedbound  Review of Systems Unable to complete  Physical Exam General: Ill-appearing Pulmonary: Unlabored Extremities: no edema, no joint deformities Skin: Left leg wound noted but not visualized Neurological: Lethargic  IMPRESSION: Patient seen in ED.  She has multiple recent hospitalizations over the past 3 months for complication related to her left leg wound requiring multiple surgical debridements.  Patient has been at [facility] where she has been doing poorly, per report of family.  Patient now rehospitalized with UTI and possible wound infection.  I met with patient's son in the emergency department.  I then called and had a conversation with patient's daughter via phone.  Family agreed that patient has significantly declined over the past several months.  She is now mostly bed/chair bound.  They feel like her quality of life has become quite poor.  We talked about patient's multiple comorbidities and the high likelihood of further decline.  Family understand that patient may be nearing end-of-life.  Son would like to give patient a few days to see if she responds to antibiotics.  However, we discussed the possible transition to comfort care in the event of further decline or if patient fails to respond meaningfully to treatment.    PLAN: -Continue current scope of treatment -Son would like to give patient a few days  on antibiotics to see if she improves -Family interested  in speaking with hospice liaison for more information  Case and plan discussed with Dr. Denton Lank   Time Total: 60 minutes  Visit consisted of counseling and education dealing with the complex and emotionally intense issues of symptom management and palliative care in the setting of serious and potentially life-threatening illness.Greater than 50%  of this time was spent counseling and coordinating care related to the above assessment and plan.  Signed by: Laurette Schimke, PhD, NP-C

## 2023-06-24 NOTE — Progress Notes (Signed)
 Spoke with nurse manager at [facility] who states patient can be fearful and anxious surrounding dressing changes and they have had success working with her with an extra person in the room. Patient is hard of hearing and responds better to mobility and treatments if she understands what is going to happen first. Noted impaired hearing in chart.

## 2023-06-25 ENCOUNTER — Encounter: Payer: Self-pay | Admitting: Hematology and Oncology

## 2023-06-25 ENCOUNTER — Telehealth: Payer: Self-pay | Admitting: *Deleted

## 2023-06-25 ENCOUNTER — Encounter: Payer: Self-pay | Admitting: Hospice and Palliative Medicine

## 2023-06-25 DIAGNOSIS — E440 Moderate protein-calorie malnutrition: Secondary | ICD-10-CM | POA: Insufficient documentation

## 2023-06-25 DIAGNOSIS — T148XXA Other injury of unspecified body region, initial encounter: Secondary | ICD-10-CM | POA: Diagnosis not present

## 2023-06-25 DIAGNOSIS — L089 Local infection of the skin and subcutaneous tissue, unspecified: Secondary | ICD-10-CM | POA: Diagnosis not present

## 2023-06-25 LAB — GLUCOSE, CAPILLARY: Glucose-Capillary: 60 mg/dL — ABNORMAL LOW (ref 70–99)

## 2023-06-25 MED ORDER — TRAZODONE HCL 50 MG PO TABS
25.0000 mg | ORAL_TABLET | Freq: Every evening | ORAL | Status: DC | PRN
  Administered 2023-06-28 – 2023-06-29 (×2): 25 mg via ORAL
  Filled 2023-06-25 (×3): qty 1

## 2023-06-25 MED ORDER — ENSURE ENLIVE PO LIQD
237.0000 mL | Freq: Two times a day (BID) | ORAL | Status: DC
  Administered 2023-06-26 – 2023-06-30 (×9): 237 mL via ORAL

## 2023-06-25 MED ORDER — VITAMIN B-12 1000 MCG PO TABS
1000.0000 ug | ORAL_TABLET | Freq: Every day | ORAL | Status: DC
Start: 2023-06-26 — End: 2023-07-01
  Administered 2023-06-26 – 2023-07-01 (×6): 1000 ug via ORAL
  Filled 2023-06-25 (×6): qty 1

## 2023-06-25 MED ORDER — LEVOTHYROXINE SODIUM 50 MCG PO TABS
25.0000 ug | ORAL_TABLET | Freq: Every day | ORAL | Status: DC
  Administered 2023-06-26 – 2023-07-01 (×6): 25 ug via ORAL
  Filled 2023-06-25 (×6): qty 1

## 2023-06-25 MED ORDER — ADULT MULTIVITAMIN W/MINERALS CH
1.0000 | ORAL_TABLET | Freq: Every day | ORAL | Status: DC
  Administered 2023-06-26 – 2023-06-30 (×5): 1 via ORAL
  Filled 2023-06-25 (×5): qty 1

## 2023-06-25 MED ORDER — VITAMIN D3 25 MCG (1000 UNIT) PO TABS
1000.0000 [IU] | ORAL_TABLET | Freq: Every day | ORAL | Status: DC
  Administered 2023-06-26 – 2023-07-01 (×6): 1000 [IU] via ORAL
  Filled 2023-06-25 (×12): qty 1

## 2023-06-25 MED ORDER — HYDROXYZINE HCL 25 MG PO TABS: 25.0000 mg | ORAL_TABLET | Freq: Three times a day (TID) | ORAL | Status: DC | PRN

## 2023-06-25 MED ORDER — CALCITRIOL 0.25 MCG PO CAPS
0.2500 ug | ORAL_CAPSULE | ORAL | Status: DC
  Administered 2023-06-25 – 2023-06-30 (×3): 0.25 ug via ORAL
  Filled 2023-06-25 (×3): qty 1

## 2023-06-25 MED ORDER — POLYETHYLENE GLYCOL 3350 17 G PO PACK
17.0000 g | PACK | Freq: Every day | ORAL | Status: DC
  Administered 2023-06-25 – 2023-07-01 (×7): 17 g via ORAL
  Filled 2023-06-25 (×7): qty 1

## 2023-06-25 MED ORDER — FERROUS SULFATE 325 (65 FE) MG PO TABS
325.0000 mg | ORAL_TABLET | Freq: Every day | ORAL | Status: DC
  Administered 2023-06-26 – 2023-06-30 (×5): 325 mg via ORAL
  Filled 2023-06-25 (×5): qty 1

## 2023-06-25 MED ORDER — VANCOMYCIN HCL 500 MG/100ML IV SOLN
500.0000 mg | INTRAVENOUS | Status: DC
  Administered 2023-06-25 – 2023-06-26 (×2): 500 mg via INTRAVENOUS
  Filled 2023-06-25 (×3): qty 100

## 2023-06-25 MED ORDER — VITAMIN C 500 MG PO TABS
500.0000 mg | ORAL_TABLET | Freq: Two times a day (BID) | ORAL | Status: DC
  Administered 2023-06-25 – 2023-07-01 (×12): 500 mg via ORAL
  Filled 2023-06-25 (×12): qty 1

## 2023-06-25 MED ORDER — CITALOPRAM HYDROBROMIDE 20 MG PO TABS
10.0000 mg | ORAL_TABLET | Freq: Every day | ORAL | Status: DC
  Administered 2023-06-25 – 2023-07-01 (×7): 10 mg via ORAL
  Filled 2023-06-25 (×7): qty 1

## 2023-06-25 NOTE — Consult Note (Addendum)
 WOC communicated with bedside nurse this morning. Verified need for NPWT dressing to be removed and ordered wound care to be provided for patient daily.  Bedside nurse reviewed orders, will order needed supplies if not on the floor and provide wound care. Requested MD to take picture of wound if possible.  Daily Vashe soaked gauze [place] # [phone]) to wound bed per orders Apply Adaptic [place] # 29), then apply [place] Jelly to entire wound bed, cover with ABD pads, secure with kerlix. Change daily Offload heels with Prevalon boots bilaterally Paint heel wounds with betadine, allow to air dry. Ok to protect with silicone foam  No further surgical intervention desired per MD notes, discussion on hospice DC. Hospice and Palliative care NP following along.   WOC reviewed images taken; areas of partial thickness skin loss over the hip/buttock; left posterior leg; addressed with topical care. Added low air loss mattress for moisture management and pressure redistribution.    Re consult if needed, will not follow at this time. Thanks  Amazing Cowman [employer], RN,CWOCN, CNS, CWON-AP [phone])

## 2023-06-25 NOTE — Evaluation (Signed)
 Physical Therapy Evaluation Patient Details Name: Laura Wilcox MRN: [id number] DOB: February 27, 1927 Today's Date: 06/25/2023  History of Present Illness  Pt is a 89 y.o. female who presents with altered mental status. Admitted for UTI, LLE wound.  PMH of dementia, hypothyroidism, depression with anxiety, CKD-3B, skin cancer, UTI, myelofibrosis and polycythemia rubra vera, chronic left foot wound after hematoma (VAC) s/p LLE angiogram and stent L superficial popliteal artery 1/14 with debridement and placement of skin substitute 1/27, 2/3, 3/10.   Clinical Impression  Patient alert, oriented to self, place, very HOH. Disoriented to situation, unable to provide PLOF (difficulty with communication due to [facility]). Per chart review pt from [facility], was at home and ambulatory in December of 2024, unsure of pt progress/mobility at rehab. Supine <> sit with maxAx2. Pt encouraged to mobilize, though stated once or twice that she would like to rest, lay back down. Declined further OOB mobility. Pt returned to supine, laying on her side, needs in reach.  Overall the patient demonstrated deficits (see "PT Problem List") that impede the patient's functional abilities, safety, and mobility and would benefit from skilled PT intervention to attempt to maximize outcomes as able.        If plan is discharge home, recommend the following: Two people to help with walking and/or transfers;Two people to help with bathing/dressing/bathroom;Direct supervision/assist for medications management;Help with stairs or ramp for entrance;Assist for transportation;Assistance with cooking/housework;Supervision due to cognitive status   Can travel by private vehicle   No    Equipment Recommendations Other (comment) (TBD)  Recommendations for Other Services       Functional Status Assessment Patient has had a recent decline in their functional status and demonstrates the ability to make significant improvements in function in a  reasonable and predictable amount of time.     Precautions / Restrictions Precautions Precautions: Fall Recall of Precautions/Restrictions: Impaired Restrictions Weight Bearing Restrictions Per Provider Order: Yes LLE Weight Bearing Per Provider Order: Non weight bearing Other Position/Activity Restrictions: no official order but wound vac in place on LLE (though machine not connected)      Mobility  Bed Mobility Overal bed mobility: Needs Assistance Bed Mobility: Supine to Sit, Sit to Supine     Supine to sit: Max assist, +2 for physical assistance Sit to supine: Max assist, +2 for physical assistance   General bed mobility comments: from supine position in bed with cueing; able to sit unsupported with Min progressing to SBA    Transfers                   General transfer comment: deferred to pt activity tolerance, pt declined further mobility    Ambulation/Gait                  Stairs            Wheelchair Mobility     Tilt Bed    Modified Rankin (Stroke Patients Only)       Balance Overall balance assessment: Needs assistance Sitting-balance support: Feet unsupported Sitting balance-Leahy Scale: Good                                       Pertinent Vitals/Pain Pain Assessment Pain Assessment: Faces Faces Pain Scale: Hurts little more Pain Location: with MD touching L heel/foot and also mentioned back pain with returning to supine Pain Descriptors / Indicators: Grimacing, Guarding, Sore, Moaning Pain  Intervention(s): Limited activity within patient's tolerance, Monitored during session, Repositioned    Home Living Family/patient expects to be discharged to:: Skilled nursing facility                   Additional Comments: per chart, daughter lives in the area    Prior Function Prior Level of Function : Needs assist             Mobility Comments: from chart review 2 months ago-Per daughter reports,  patient was ambulatory using a 2-wheeled RW prior to Dec 2024 hospitalization ADLs Comments: per chart review 2 months ago-per dtr pt  Supervision for bathing. IND with dressing prior to Dec 2024 hospitalization     Extremity/Trunk Assessment   Upper Extremity Assessment Upper Extremity Assessment: Difficult to assess due to impaired cognition    Lower Extremity Assessment Lower Extremity Assessment: Generalized weakness LLE Deficits / Details: had a wound vac on L foot, but not intact during session       Communication   Communication Factors Affecting Communication: Hearing impaired    Cognition Arousal: Alert Behavior During Therapy: WFL for tasks assessed/performed                           PT - Cognition Comments: oriented to self, place, disoriented to situation Following commands: Intact       Cueing Cueing Techniques: Verbal cues     General Comments General comments (skin integrity, edema, etc.): multiple wounds to LLE and back, nurse aware    Exercises     Assessment/Plan    PT Assessment Patient needs continued PT services  PT Problem List Decreased strength;Pain;Decreased activity tolerance;Decreased balance;Decreased mobility;Decreased knowledge of precautions;Decreased range of motion       PT Treatment Interventions DME instruction;Balance training;Neuromuscular re-education;Gait training;Stair training;Functional mobility training;Patient/family education;Therapeutic activities;Therapeutic exercise    PT Goals (Current goals can be found in the Care Plan section)  Acute Rehab PT Goals Patient Stated Goal: to rest PT Goal Formulation: With patient Time For Goal Achievement: 07/09/23 Potential to Achieve Goals: Good    Frequency Min 1X/week     Co-evaluation PT/OT/SLP Co-Evaluation/Treatment: Yes Reason for Co-Treatment: For patient/therapist safety PT goals addressed during session: Mobility/safety with mobility OT goals  addressed during session: ADL's and self-care       AM-PAC PT "6 Clicks" Mobility  Outcome Measure Help needed turning from your back to your side while in a flat bed without using bedrails?: A Lot Help needed moving from lying on your back to sitting on the side of a flat bed without using bedrails?: A Lot Help needed moving to and from a bed to a chair (including a wheelchair)?: Total Help needed standing up from a chair using your arms (e.g., wheelchair or bedside chair)?: Total Help needed to walk in hospital room?: Total Help needed climbing 3-5 steps with a railing? : Total 6 Click Score: 8    End of Session   Activity Tolerance: Patient tolerated treatment well Patient left: with call bell/phone within reach;with bed alarm set;in bed Nurse Communication: Mobility status PT Visit Diagnosis: Difficulty in walking, not elsewhere classified (R26.2);Muscle weakness (generalized) (M62.81);Other abnormalities of gait and mobility (R26.89)    Time: 7846-9629 PT Time Calculation (min) (ACUTE ONLY): 19 min   Charges:   PT Evaluation $PT Eval Moderate Complexity: 1 Mod   PT General Charges $$ ACUTE PT VISIT: 1 Visit        Lafonda Mosses  Orvan Falconer PT, DPT 1:30 PM,06/25/23

## 2023-06-25 NOTE — Progress Notes (Addendum)
 Progress Note    [name]  UJW:[id number] DOB: [birth year]  DOA: 06/23/2023 PCP: Smiley Houseman, NP      Brief Narrative:    Medical records reviewed and are as summarized below:  Laura Wilcox is a 89 y.o. female  with medical history significant of dementia, hypothyroidism, depression with anxiety, CKD-3B, skin cancer, UTI, myelofibrosis and polycythemia rubra vera, chronic left foot wound after hematoma (VAC), chronic cough, recent discharge from [facility] with pneumonia, who [facility] the [facility] with altered mental status. According to her son, patient has been confused for about a week and has had worsened in the preceding 2 days prior to admission. Oxygen saturation was in the 70s in the ED.    Assessment/Plan:   Principal Problem:   Wound infection Active Problems:   Wound of left lower extremity   UTI (urinary tract infection)   Acute metabolic encephalopathy   Acute respiratory failure with hypoxia (HCC)   Chronic kidney disease, stage 3b (HCC)   Hypothyroidism   Anemia in chronic kidney disease   Polycythemia rubra vera (HCC)   Myelofibrosis (HCC)   Depression with anxiety   Palliative care encounter    Body mass index is 22.24 kg/m.    Left foot wound infection Staph epidermidis bacteremia (1 out of 4 bottles positive) Acute UTI Urine culture growing Klebsiella pneumoniae and Providencia stuartii Continue empiric IV cefepime, vancomycin and Flagyl CRP 3.6.  Lactic acid was normal. Consulted Dr. Annamary Rummage, podiatrist, for left foot wound infection. Consulted Dr. Rivka Safer, ID specialist, for Staph epidermidis bacteremia.  She said this is a contaminant and there is no need for a formal consult   Acute metabolic encephalopathy on underlying dementia: Mental status has improved and she is likely back to baseline   Acute hypoxic respiratory failure Improved.  She is on 2 L/min oxygen via  [place].   CKD stage IIIb Creatinine stable   Hypoglycemia IV dextrose as needed.  Encourage adequate oral intake.   Anemia of chronic disease Hemoglobin downtrending, from 9.6 - 7.6).  Monitor H&H and transfuse as needed.   Decubitus ulcers (present on admission) Left heel deep tissue injury, stage II decubitus ulcers on the back Continue local wound care.   Comorbidities include polycythemia rubra vera, myelofibrosis (follows with Dr. Cathie Hoops, oncologist), protein calorie malnutrition, depression, anxiety,  hypothyroidism   Case was discussed with Sharia Reeve, NP, with palliative care team.  Diet Order             DIET DYS 3 Room service appropriate? Yes; Fluid consistency: Thin  Diet effective now                            Consultants: Palliative care  Procedures: None    Medications:    calcitRIOL  0.25 mcg Oral Q M,W,F   citalopram  10 mg Oral Daily   [START ON 06/26/2023] ferrous sulfate  325 mg Oral Daily   heparin  5,000 Units Subcutaneous Q8H   [START ON 06/26/2023] levothyroxine  25 mcg Oral Q0600   polyethylene glycol  17 g Oral Daily   [START ON 06/26/2023] vitamin B-12  1,000 mcg Oral Daily   [START ON 06/26/2023] Vitamin D3  1,000 Units Oral Daily   Continuous Infusions:  ceFEPime (MAXIPIME) IV Stopped (06/24/23 1748)   metronidazole 500 mg (06/25/23 0702)   vancomycin       Anti-infectives (From admission, onward)  Start     Dose/Rate Route Frequency Ordered Stop   06/25/23 2030  vancomycin (VANCOCIN) IVPB 1000 mg/200 mL premix  Status:  Discontinued        1,000 mg 200 mL/hr over 60 Minutes Intravenous Every 48 hours 06/24/23 0137 06/25/23 0919   06/25/23 2030  vancomycin (VANCOREADY) IVPB 500 mg/100 mL        500 mg 100 mL/hr over 60 Minutes Intravenous Every 24 hours 06/25/23 0919     06/24/23 1800  ceFEPIme (MAXIPIME) 2 g in sodium chloride 0.9 % 100 mL IVPB        2 g 200 mL/hr over 30 Minutes Intravenous Every 24 hours 06/24/23  0134     06/24/23 0700  metroNIDAZOLE (FLAGYL) IVPB 500 mg        500 mg 100 mL/hr over 60 Minutes Intravenous Every 12 hours 06/24/23 0005     06/23/23 1745  ceFEPIme (MAXIPIME) 2 g in sodium chloride 0.9 % 100 mL IVPB        2 g 200 mL/hr over 30 Minutes Intravenous  Once 06/23/23 1744 06/23/23 1900   06/23/23 1745  metroNIDAZOLE (FLAGYL) IVPB 500 mg        500 mg 100 mL/hr over 60 Minutes Intravenous  Once 06/23/23 1744 06/23/23 2029   06/23/23 1745  vancomycin (VANCOCIN) IVPB 1000 mg/200 mL premix        1,000 mg 200 mL/hr over 60 Minutes Intravenous  Once 06/23/23 1744 06/23/23 2133              Family Communication/Anticipated D/C date and plan/Code Status   DVT prophylaxis: heparin injection 5,000 Units Start: 06/23/23 2315     Code Status: Limited: Do not attempt resuscitation (DNR) -DNR-LIMITED -Do Not Intubate/DNI   Family Communication: Plan discussed with her son at the bedside Disposition Plan: Plan to discharge to SNF   Status is: Inpatient Remains inpatient appropriate because: Wound infection       Subjective:   Interval events noted.  She has no complaints.  PT and OT were at the bedside.  Objective:    Vitals:   06/24/23 2017 06/25/23 0436 06/25/23 0913 06/25/23 1300  BP:  (!) 113/45 (!) 147/107 (!) 113/52  Pulse:  84 74 69  Resp:  18 18 15   Temp:  98.5 F (36.9 C) 97.8 F (36.6 C) 98.4 F (36.9 C)  TempSrc:  Oral  Oral  SpO2: 98% 98% 98% 100%  Weight:      Height:       No data found.   Intake/Output Summary (Last 24 hours) at 06/25/2023 1454 Last data filed at 06/25/2023 0622 Gross per 24 hour  Intake 0 ml  Output --  Net 0 ml   Filed Weights   06/24/23 0121  Weight: 51.7 kg    Exam:  GEN: NAD SKIN: Warm and dry.  Decubitus wounds on the back, heels.  Left foot wound with wound VAC EYES: No pallor or icterus ENT: MMM CV: RRR PULM: CTA B ABD: soft, ND, NT, +BS CNS: AAO x 2 (person and place), non focal EXT: No  edema or tenderness               Data Reviewed:   I have personally reviewed following labs and imaging studies:  Labs: Labs show the following:   Basic Metabolic Panel: Recent Labs  Lab 06/23/23 2241 06/24/23 0141  NA 136 135  K 4.6 4.4  CL 103 101  CO2 23 22  GLUCOSE 88 73  BUN 25* 26*  CREATININE 0.90 0.87  CALCIUM 9.1 9.0   GFR Estimated Creatinine Clearance: 28.4 mL/min (by C-G formula based on SCr of 0.87 mg/dL). Liver Function Tests: Recent Labs  Lab 06/23/23 2241  AST 23  ALT 10  ALKPHOS 65  BILITOT 0.9  PROT 5.9*  ALBUMIN 2.9*   No results for input(s): "LIPASE", "AMYLASE" in the last 168 hours. No results for input(s): "AMMONIA" in the last 168 hours. Coagulation profile Recent Labs  Lab 06/23/23 1830  INR 1.0    CBC: Recent Labs  Lab 06/23/23 1736 06/24/23 0141  WBC 13.4* 13.6*  NEUTROABS 9.6*  --   HGB 9.6* 7.6*  HCT 32.1* 25.1*  MCV 87.5 88.1  PLT 838* 904*   Cardiac Enzymes: No results for input(s): "CKTOTAL", "CKMB", "CKMBINDEX", "TROPONINI" in the last 168 hours. BNP (last 3 results) No results for input(s): "PROBNP" in the last 8760 hours. CBG: Recent Labs  Lab 06/24/23 1931 06/24/23 2224 06/25/23 0912  GLUCAP 66* 79 60*   D-Dimer: No results for input(s): "DDIMER" in the last 72 hours. Hgb A1c: No results for input(s): "HGBA1C" in the last 72 hours. Lipid Profile: No results for input(s): "CHOL", "HDL", "LDLCALC", "TRIG", "CHOLHDL", "LDLDIRECT" in the last 72 hours. Thyroid function studies: No results for input(s): "TSH", "T4TOTAL", "T3FREE", "THYROIDAB" in the last 72 hours.  Invalid input(s): "FREET3" Anemia work up: No results for input(s): "VITAMINB12", "FOLATE", "FERRITIN", "TIBC", "IRON", "RETICCTPCT" in the last 72 hours. Sepsis Labs: Recent Labs  Lab 06/23/23 1736 06/23/23 1743 06/23/23 2241 06/24/23 0141  WBC 13.4*  --   --  13.6*  LATICACIDVEN  --  1.8 1.1  --      Microbiology Recent Results (from the past 240 hours)  Blood Culture (routine x 2)     Status: None (Preliminary result)   Collection Time: 06/23/23  5:36 PM   Specimen: BLOOD  Result Value Ref Range Status   Specimen Description BLOOD BLOOD LEFT FOREARM  Final   Special Requests   Final    BOTTLES DRAWN AEROBIC AND ANAEROBIC Blood Culture results may not be optimal due to an inadequate volume of blood received in culture bottles   Culture  Setup Time   Final    GRAM POSITIVE COCCI AEROBIC BOTTLE ONLY Organism ID to follow CRITICAL RESULT CALLED TO, READ BACK BY AND VERIFIED WITH: A. DOBBS 2006 06/24/23 LRL Performed at [facility], [address] [place], [place] [address]    Culture GRAM POSITIVE COCCI  Final   Report Status PENDING  Incomplete  Blood Culture ID Panel (Reflexed)     Status: Abnormal   Collection Time: 06/23/23  5:36 PM  Result Value Ref Range Status   Enterococcus faecalis NOT DETECTED NOT DETECTED Final   Enterococcus Faecium NOT DETECTED NOT DETECTED Final   Listeria monocytogenes NOT DETECTED NOT DETECTED Final   Staphylococcus species DETECTED (A) NOT DETECTED Final    Comment: CRITICAL RESULT CALLED TO, READ BACK BY AND VERIFIED WITH: A. DOBBS 2006 06/24/23 LRL    Staphylococcus aureus (BCID) NOT DETECTED NOT DETECTED Final   Staphylococcus epidermidis DETECTED (A) NOT DETECTED Final    Comment: Methicillin (oxacillin) resistant coagulase negative staphylococcus. Possible blood culture contaminant (unless isolated from more than one blood culture draw or clinical case suggests pathogenicity). No antibiotic treatment is indicated for blood  culture contaminants. CRITICAL RESULT CALLED TO, READ BACK BY AND VERIFIED WITH: A. DOBBS 2006 06/24/23 LRL  Staphylococcus lugdunensis NOT DETECTED NOT DETECTED Final   Streptococcus species NOT DETECTED NOT DETECTED Final   Streptococcus agalactiae NOT DETECTED NOT DETECTED Final   Streptococcus  pneumoniae NOT DETECTED NOT DETECTED Final   Streptococcus pyogenes NOT DETECTED NOT DETECTED Final   A.calcoaceticus-baumannii NOT DETECTED NOT DETECTED Final   Bacteroides fragilis NOT DETECTED NOT DETECTED Final   Enterobacterales NOT DETECTED NOT DETECTED Final   Enterobacter cloacae complex NOT DETECTED NOT DETECTED Final   Escherichia coli NOT DETECTED NOT DETECTED Final   Klebsiella aerogenes NOT DETECTED NOT DETECTED Final   Klebsiella oxytoca NOT DETECTED NOT DETECTED Final   Klebsiella pneumoniae NOT DETECTED NOT DETECTED Final   Proteus species NOT DETECTED NOT DETECTED Final   Salmonella species NOT DETECTED NOT DETECTED Final   Serratia marcescens NOT DETECTED NOT DETECTED Final   Haemophilus influenzae NOT DETECTED NOT DETECTED Final   Neisseria meningitidis NOT DETECTED NOT DETECTED Final   Pseudomonas aeruginosa NOT DETECTED NOT DETECTED Final   Stenotrophomonas maltophilia NOT DETECTED NOT DETECTED Final   Candida albicans NOT DETECTED NOT DETECTED Final   Candida auris NOT DETECTED NOT DETECTED Final   Candida glabrata NOT DETECTED NOT DETECTED Final   Candida krusei NOT DETECTED NOT DETECTED Final   Candida parapsilosis NOT DETECTED NOT DETECTED Final   Candida tropicalis NOT DETECTED NOT DETECTED Final   Cryptococcus neoformans/gattii NOT DETECTED NOT DETECTED Final   Methicillin resistance mecA/C DETECTED (A) NOT DETECTED Final    Comment: CRITICAL RESULT CALLED TO, READ BACK BY AND VERIFIED WITH: A. DOBBS 2006 06/24/23 LRL Performed at [facility], [address] [place], [place] [address]   Blood Culture (routine x 2)     Status: None (Preliminary result)   Collection Time: 06/23/23  5:48 PM   Specimen: BLOOD  Result Value Ref Range Status   Specimen Description BLOOD BLOOD RIGHT FOREARM  Final   Special Requests   Final    BOTTLES DRAWN AEROBIC AND ANAEROBIC Blood Culture results may not be optimal due to an inadequate volume of blood received in  culture bottles   Culture   Final    NO GROWTH 2 DAYS Performed at [facility], [address] [place], [place] [address]    Report Status PENDING  Incomplete  Resp panel by RT-PCR (RSV, Flu A&B, Covid) Anterior Nasal Swab     Status: None   Collection Time: 06/23/23  8:45 PM   Specimen: Anterior Nasal Swab  Result Value Ref Range Status   SARS Coronavirus 2 by RT PCR NEGATIVE NEGATIVE Final    Comment: (NOTE) SARS-CoV-2 target nucleic acids are NOT DETECTED.  The SARS-CoV-2 RNA is generally detectable in upper respiratory specimens during the acute phase of infection. The lowest concentration of SARS-CoV-2 viral copies this assay can detect is 138 copies/mL. A negative result does not preclude SARS-Cov-2 infection and should not be used as the sole basis for treatment or other patient management decisions. A negative result may occur with  improper specimen collection/handling, submission of specimen other than nasopharyngeal swab, presence of viral mutation(s) within the areas targeted by this assay, and inadequate number of viral copies(<138 copies/mL). A negative result must be combined with clinical observations, patient history, and epidemiological information. The expected result is Negative.  Fact Sheet for Patients:  BloggerCourse.com  Fact Sheet for Healthcare Providers:  SeriousBroker.it  This test is no t yet approved or cleared by the [place] FDA and  has been authorized for detection and/or  diagnosis of SARS-CoV-2 by FDA under an Emergency Use Authorization (EUA). This EUA will remain  in effect (meaning this test can be used) for the duration of the COVID-19 declaration under Section 564(b)(1) of the Act, 21 U.S.C.section 360bbb-3(b)(1), unless the authorization is terminated  or revoked sooner.       Influenza A by PCR NEGATIVE NEGATIVE Final   Influenza B by PCR NEGATIVE NEGATIVE Final     Comment: (NOTE) The Xpert Xpress SARS-CoV-2/FLU/RSV plus assay is intended as an aid in the diagnosis of influenza from Nasopharyngeal swab specimens and should not be used as a sole basis for treatment. Nasal washings and aspirates are unacceptable for Xpert Xpress SARS-CoV-2/FLU/RSV testing.  Fact Sheet for Patients: BloggerCourse.com  Fact Sheet for Healthcare Providers: SeriousBroker.it  This test is not yet approved or cleared by the [place] FDA and has been authorized for detection and/or diagnosis of SARS-CoV-2 by FDA under an Emergency Use Authorization (EUA). This EUA will remain in effect (meaning this test can be used) for the duration of the COVID-19 declaration under Section 564(b)(1) of the Act, 21 U.S.C. section 360bbb-3(b)(1), unless the authorization is terminated or revoked.     Resp Syncytial Virus by PCR NEGATIVE NEGATIVE Final    Comment: (NOTE) Fact Sheet for Patients: BloggerCourse.com  Fact Sheet for Healthcare Providers: SeriousBroker.it  This test is not yet approved or cleared by the [place] FDA and has been authorized for detection and/or diagnosis of SARS-CoV-2 by FDA under an Emergency Use Authorization (EUA). This EUA will remain in effect (meaning this test can be used) for the duration of the COVID-19 declaration under Section 564(b)(1) of the Act, 21 U.S.C. section 360bbb-3(b)(1), unless the authorization is terminated or revoked.  Performed at [facility], [address], [place], [place] [address]   Urine Culture     Status: Abnormal (Preliminary result)   Collection Time: 06/23/23  9:23 PM   Specimen: Urine, Random  Result Value Ref Range Status   Specimen Description   Final    URINE, RANDOM Performed at [facility], [address], [place], [place] [address]    Special Requests   Final    NONE  Reflexed from [id number] Performed at [facility], [address] [place], [place] [address]    Culture (A)  Final    >=100,000 COLONIES/mL KLEBSIELLA PNEUMONIAE >=100,000 COLONIES/mL PROVIDENCIA STUARTII SUSCEPTIBILITIES TO FOLLOW Performed at [facility], [address] [place], [place] [address]    Report Status PENDING  Incomplete  Respiratory (~20 pathogens) panel by PCR     Status: None   Collection Time: 06/24/23  4:57 AM   Specimen: Nasopharyngeal Swab; Respiratory  Result Value Ref Range Status   Adenovirus NOT DETECTED NOT DETECTED Final   Coronavirus 229E NOT DETECTED NOT DETECTED Final    Comment: (NOTE) The Coronavirus on the Respiratory Panel, DOES NOT test for the novel  Coronavirus (2019 nCoV)    Coronavirus HKU1 NOT DETECTED NOT DETECTED Final   Coronavirus NL63 NOT DETECTED NOT DETECTED Final   Coronavirus OC43 NOT DETECTED NOT DETECTED Final   Metapneumovirus NOT DETECTED NOT DETECTED Final   Rhinovirus / Enterovirus NOT DETECTED NOT DETECTED Final   Influenza A NOT DETECTED NOT DETECTED Final   Influenza B NOT DETECTED NOT DETECTED Final   Parainfluenza Virus 1 NOT DETECTED NOT DETECTED Final   Parainfluenza Virus 2 NOT DETECTED NOT DETECTED Final   Parainfluenza Virus 3 NOT DETECTED NOT DETECTED Final  Parainfluenza Virus 4 NOT DETECTED NOT DETECTED Final   Respiratory Syncytial Virus NOT DETECTED NOT DETECTED Final   Bordetella pertussis NOT DETECTED NOT DETECTED Final   Bordetella Parapertussis NOT DETECTED NOT DETECTED Final   Chlamydophila pneumoniae NOT DETECTED NOT DETECTED Final   Mycoplasma pneumoniae NOT DETECTED NOT DETECTED Final    Comment: Performed at [facility], [address], [place], [place] [address]    Procedures and diagnostic studies:  CT HEAD WO CONTRAST ( ) Result Date: 06/24/2023 CLINICAL DATA:  Altered mental status for 2 days EXAM: CT HEAD WITHOUT CONTRAST TECHNIQUE: Contiguous axial images were  obtained from the base of the skull through the vertex without intravenous contrast. RADIATION DOSE REDUCTION: This exam was performed according to the departmental dose-optimization program which includes automated exposure control, adjustment of the mA and/or kV according to patient size and/or use of iterative reconstruction technique. COMPARISON:  03/07/2023 FINDINGS: Brain: No evidence of acute infarction, hemorrhage, hydrocephalus, extra-axial collection or mass lesion/mass effect. Mild atrophic changes and chronic white matter ischemic changes are noted. Vascular: No hyperdense vessel or unexpected calcification. Skull: Normal. Negative for fracture or focal lesion. Sinuses/Orbits: No acute finding. Other: None. IMPRESSION: Atrophic and ischemic changes stable from the prior exam. Electronically Signed   By: Alcide Clever M.D.   On: 06/24/2023 03:01   CT Angio Chest Pulmonary Embolism (PE) W or WO Contrast Result Date: 06/24/2023 CLINICAL DATA:  Pulmonary embolus suspected with high probability. Altered mental status. Recent discharge with pneumonia. EXAM: CT ANGIOGRAPHY CHEST WITH CONTRAST TECHNIQUE: Multidetector CT imaging of the chest was performed using the standard protocol during bolus administration of intravenous contrast. Multiplanar CT image reconstructions and MIPs were obtained to evaluate the vascular anatomy. RADIATION DOSE REDUCTION: This exam was performed according to the departmental dose-optimization program which includes automated exposure control, adjustment of the mA and/or kV according to patient size and/or use of iterative reconstruction technique. CONTRAST:  75mL OMNIPAQUE IOHEXOL 350 MG/ML SOLN COMPARISON:  Chest radiograph 06/23/2023 and 04/27/2023 FINDINGS: Cardiovascular: Technically adequate study with good opacification of the central and segmental pulmonary arteries. Mild motion artifact. No focal filling defects in the pulmonary arteries. No evidence of significant  pulmonary embolus. Normal heart size. No pericardial effusions. Calcification of the aorta and coronary arteries. No aortic aneurysm or dissection. Mediastinum/Nodes: Small esophageal hiatal hernia. Esophagus is decompressed. No significant lymphadenopathy. Thyroid gland is unremarkable. Lungs/Pleura: Emphysematous changes throughout the lungs. Scarring in the apices and bases. No airspace disease or consolidation is seen. No pleural effusion or pneumothorax. Upper Abdomen: No acute abnormalities. Musculoskeletal: Degenerative changes in the spine. No acute bony abnormalities. Review of the MIP images confirms the above findings. IMPRESSION: 1. No evidence of significant pulmonary embolus. 2. Prominent emphysematous changes in the lungs. No evidence of active pulmonary disease. 3. Aortic atherosclerosis. Electronically Signed   By: Burman Nieves M.D.   On: 06/24/2023 03:00   DG Chest Port 1 View Result Date: 06/23/2023 CLINICAL DATA:  Possible sepsis EXAM: PORTABLE CHEST 1 VIEW COMPARISON:  04/27/2023 FINDINGS: Cardiac shadow is stable. Previously seen hiatal hernia is again identified and stable. Lungs are well aerated bilaterally. No focal infiltrate or effusion is seen. No acute bony abnormality is noted. Old healed rib fractures are noted on the right. IMPRESSION: Hiatal hernia.  No acute abnormality noted Electronically Signed   By: Alcide Clever M.D.   On: 06/23/2023 20:21               LOS: 1  day   Kashaun Bebo  [facility]   Pager on [email or web address]. If 7PM-7AM, please contact night-coverage at [email or web address]     06/25/2023, 2:54 PM

## 2023-06-25 NOTE — Plan of Care (Signed)

## 2023-06-25 NOTE — Progress Notes (Addendum)
 Initial Nutrition Assessment  DOCUMENTATION CODES:   Non-severe (moderate) malnutrition in context of chronic illness  INTERVENTION:   Ensure Enlive po BID, each supplement provides 350 kcal and 20 grams of protein.  Magic cup TID with meals, each supplement provides 290 kcal and 9 grams of protein  MVI po daily   Vitamin C 500mg  po BID  Pt at high refeed risk; recommend monitor potassium, magnesium and phosphorus labs daily until stable  Daily weights    NUTRITION DIAGNOSIS:   Moderate Malnutrition related to chronic illness as evidenced by moderate muscle depletion, moderate fat depletion.  GOAL:   Patient will meet greater than or equal to 90% of their needs  MONITOR:   PO intake, Supplement acceptance, Labs, Weight trends, Skin, I & O's  REASON FOR ASSESSMENT:   Malnutrition Screening Tool    ASSESSMENT:   89 y.o. female  with medical history significant of dementia, hypothyroidism, depression with anxiety, CKD-3B, skin cancer, UTI, myelofibrosis and polycythemia rubra vera, chronic left foot wound after hematoma (VAC), chronic cough and recent discharge from [facility] with pneumonia who is admitted with UTI, bacteremia, AMS and wound infection.  Visited pt's room today. Pt lethargic and does not provide any history. NT at bedside reports that patient has been too lethargic to eat today. Per chart, pt is down ~6lbs(5%) over the past 2 months. RD suspects pt with decreased oral intake at baseline secondary to dementia. RD will add supplements and vitamins to help pt meet her estimated needs. Pt is at high refeed risk. Palliative care consult is pending.    Medications reviewed and include: calcitriol, D3, celexa, ferrous sulfate, heparin, synthroid, miralax, cefepime, metronidazole, vancomycin   Labs reviewed: K 4.4 wnl, BUN 26(H) Wbc- 13.6(H), Hgb 7.6(L), Hct 25.1(L) Cbgs- 60, 79, 66 x 24 hrs   NUTRITION - FOCUSED PHYSICAL EXAM:  Flowsheet Row Most  Recent Value  Orbital Region Mild depletion  Upper Arm Region Moderate depletion  Thoracic and Lumbar Region Moderate depletion  Buccal Region Moderate depletion  Temple Region Moderate depletion  Clavicle Bone Region Mild depletion  Clavicle and Acromion Bone Region Mild depletion  Scapular Bone Region Moderate depletion  Dorsal Hand Severe depletion  Patellar Region Moderate depletion  Anterior Thigh Region Moderate depletion  Posterior Calf Region Moderate depletion  Edema (RD Assessment) None  Hair Reviewed  Eyes Reviewed  Mouth Reviewed  Skin Reviewed  [widepsread ecchymosis]  Nails Reviewed   Diet Order:   Diet Order             DIET DYS 3 Room service appropriate? Yes; Fluid consistency: Thin  Diet effective now                  EDUCATION NEEDS:   Not appropriate for education at this time  Skin:  Skin Assessment: Reviewed RN Assessment (skin tear, chronic foot wound)  Last BM:  3/25  Height:   Ht Readings from Last 1 Encounters:  06/24/23 5' (1.524 m)    Weight:   Wt Readings from Last 1 Encounters:  06/25/23 52.4 kg    Ideal Body Weight:  45 kg  BMI:  Body mass index is 22.56 kg/m.  Estimated Nutritional Needs:   Kcal:  1200-1400kcal/day  Protein:  70-80g/day  Fluid:  1.2-1.4L/day  Betsey Holiday MS, RD, LDN If unable to be reached, please send secure chat to "RD inpatient" available from 8:00a-4:00p daily

## 2023-06-25 NOTE — Progress Notes (Signed)
 Pharmacy Antibiotic Note  Laura Wilcox is a 89 y.o. female admitted on 06/23/2023 with  wound infection . PMH significant for dementia, hypothyroidism, depression with anxiety, CKD-3B, skin cancer, UTI, myelofibrosis and polycythemia rubra vera, chronic left foot wound after hematoma (VAC). Patient remains afebrile with WBC 13.4. Pharmacy has been consulted for Cefepime, Vancomycin dosing.  Plan: Continue cefepime 2 g IV every 24 hours Adjust vancomycin to 500 mg IV every 24 hours (eAUC 480.2, Scr 0.87, IBW used, Vd 0.72 L/kg) Monitor renal function, clinical status, culture data and LOT  Height: 5' (152.4 cm) Weight: 51.7 kg (113 lb 14.4 oz) IBW/kg (Calculated) : 45.5  Temp (24hrs), Avg:98.1 F (36.7 C), Min:97.6 F (36.4 C), Max:98.5 F (36.9 C)  Recent Labs  Lab 06/18/23 1310 06/23/23 1736 06/23/23 1743 06/23/23 2241 06/24/23 0141  WBC 11.8* 13.4*  --   --  13.6*  CREATININE  --   --   --  0.90 0.87  LATICACIDVEN  --   --  1.8 1.1  --     Estimated Creatinine Clearance: 28.4 mL/min (by C-G formula based on SCr of 0.87 mg/dL).    Allergies  Allergen Reactions   Bactrim [Sulfamethoxazole-Trimethoprim]     Due to kidney disease    Epinephrine Other (See Comments)   Other     Per daughter RUE BP significantly higher than LUE   Antimicrobials this admission: Cefepime 3/24 >> Metronidazole 3/24 >> Vancomycin 3/24 >>  Dose adjustments this admission: 3/26: Vancomycin adjusted from 1000 mg IV every 48 hours to 500 mg IV every 24 hours  Microbiology results: 3/24 BCx: 1/4 staph epi (MecA positive) 3/24 UCx: 100K colonies of GNR    Thank you for involving pharmacy in this patient's care.   Rockwell Alexandria, PharmD Clinical Pharmacist 06/25/2023 9:09 AM

## 2023-06-25 NOTE — Evaluation (Signed)
 Occupational Therapy Evaluation Patient Details Name: Laura Wilcox MRN: [id number] DOB: Dec 17, 1926 Today's Date: 06/25/2023   History of Present Illness   Pt is a 89 y.o. female who presents with altered mental status. Admitted for UTI, LLE wound.  PMH of dementia, hypothyroidism, depression with anxiety, CKD-3B, skin cancer, UTI, myelofibrosis and polycythemia rubra vera, chronic left foot wound after hematoma (VAC) s/p LLE angiogram and stent L superficial popliteal artery 1/14 with debridement and placement of skin substitute 1/27, 2/3, 3/10.     Clinical Impressions Pt was seen for PT/OT co-evaluation this date to maximize pt/therapist safety. Prior to hospital admission, pt was at [facility] for STR however was a resident of [facility] prior to that. Appears per chart review that prior to Dec 2024 pt was ambulatory with RW and MOD I/IND with ADLs. In Jan 2025 she began with LLE/foot wound issues and has been chair/bed bound since. Pt unable to provide history.  Pt presents to acute OT demonstrating impaired ADL performance and functional mobility 2/2 weakness, pain, balance deficits, and limited activity tolerance. Pt currently requires Max A X2 for all bed mobility. She tolerated sitting EOB unsupported with Min A initially progressing to SBA. Good dynamic seated balance for seated grooming tasks with set up assist. Pt declined transfer to recliner d/t fatigue. Multiple wounds noted to LLE and to her back. Will follow acutely for OT services to prevent further decline/weakness, however family may be considering a transition to comfort care. Will follow physician recommendations for DC.     If plan is discharge home, recommend the following:   Two people to help with walking and/or transfers;A lot of help with bathing/dressing/bathroom     Functional Status Assessment   Patient has had a recent decline in their functional status and demonstrates the ability to make significant  improvements in function in a reasonable and predictable amount of time.     Equipment Recommendations   Other (comment) (defer)     Recommendations for Other Services         Precautions/Restrictions   Precautions Precautions: Fall Recall of Precautions/Restrictions: Impaired Restrictions Weight Bearing Restrictions Per Provider Order: Yes LLE Weight Bearing Per Provider Order: Non weight bearing Other Position/Activity Restrictions: no official order, but treated as NWB-wound vac in place but not connected     Mobility Bed Mobility Overal bed mobility: Needs Assistance Bed Mobility: Supine to Sit, Sit to Supine     Supine to sit: Max assist, +2 for physical assistance Sit to supine: Max assist, +2 for physical assistance   General bed mobility comments: from supine position in bed with cueing; able to sit unsupported with Min progressing to SBA    Transfers                   General transfer comment: deferred to pt activity tolerance      Balance Overall balance assessment: Needs assistance Sitting-balance support: Feet unsupported Sitting balance-Leahy Scale: Good Sitting balance - Comments: Min progressing to SBA for seated balance during grooming tasks       Standing balance comment: deferred                           ADL either performed or assessed with clinical judgement   ADL Overall ADL's : Needs assistance/impaired     Grooming: Wash/dry face;Oral care;Set up;Sitting Grooming Details (indicate cue type and reason): seated EOB with CGA for unsupported seated balance  General ADL Comments: anticipate Min A for UB ADLs and total/Max A for LB ADLs     Vision         Perception         Praxis         Pertinent Vitals/Pain Pain Assessment Pain Assessment: Faces Faces Pain Scale: Hurts little more Pain Location: with MD touching L heel/foot and also mentioned back pain  with returning to supine Pain Descriptors / Indicators: Grimacing, Guarding, Sore, Moaning Pain Intervention(s): Monitored during session, Repositioned     Extremity/Trunk Assessment Upper Extremity Assessment Upper Extremity Assessment: Difficult to assess due to impaired cognition   Lower Extremity Assessment Lower Extremity Assessment: Generalized weakness LLE Deficits / Details: had a wound vac on L foot, but not intact during session       Communication Communication Factors Affecting Communication: Hearing impaired   Cognition Arousal: Alert Behavior During Therapy: WFL for tasks assessed/performed Cognition: History of cognitive impairments, Cognition impaired   Orientation impairments: Situation, Place, Time                           Following commands: Intact       Cueing  General Comments   Cueing Techniques: Verbal cues  multiple wounds to LLE and back, nurse aware   Exercises     Shoulder Instructions      Home Living Family/patient expects to be discharged to:: Skilled nursing facility                                 Additional Comments: per chart, daughter lives in the area      Prior Functioning/Environment Prior Level of Function : Needs assist             Mobility Comments: from chart review 2 months ago-Per daughter reports, patient was ambulatory using a 2-wheeled RW prior to Dec 2024 hospitalization ADLs Comments: per chart review 2 months ago-per dtr pt  Supervision for bathing. IND with dressing prior to Dec 2024 hospitalization    OT Problem List: Decreased strength;Decreased activity tolerance;Impaired balance (sitting and/or standing)   OT Treatment/Interventions: Self-care/ADL training;Therapeutic exercise;Therapeutic activities;DME and/or AE instruction;Patient/family education;Balance training      OT Goals(Current goals can be found in the care plan section)   Acute Rehab OT Goals OT Goal  Formulation: Patient unable to participate in goal setting Time For Goal Achievement: 07/09/23 Potential to Achieve Goals: Fair ADL Goals Pt Will Perform Upper Body Bathing: with supervision;sitting Pt Will Transfer to Toilet: with mod assist;with +2 assist;bedside commode (lateral scoot)   OT Frequency:  Min 1X/week    Co-evaluation PT/OT/SLP Co-Evaluation/Treatment: Yes Reason for Co-Treatment: For patient/therapist safety PT goals addressed during session: Mobility/safety with mobility OT goals addressed during session: ADL's and self-care      AM-PAC OT "6 Clicks" Daily Activity     Outcome Measure Help from another person eating meals?: A Little Help from another person taking care of personal grooming?: A Little Help from another person toileting, which includes using toliet, bedpan, or urinal?: Total Help from another person bathing (including washing, rinsing, drying)?: Total Help from another person to put on and taking off regular upper body clothing?: A Lot Help from another person to put on and taking off regular lower body clothing?: Total 6 Click Score: 11   End of Session    Activity Tolerance: Patient limited by  fatigue;Patient tolerated treatment well Patient left: in bed;with call bell/phone within reach;with bed alarm set  OT Visit Diagnosis: Other abnormalities of gait and mobility (R26.89);Muscle weakness (generalized) (M62.81)                Time: 4098-1191 OT Time Calculation (min): 19 min Charges:  OT General Charges $OT Visit: 1 Visit OT Evaluation $OT Eval Low Complexity: 1 Low Prynce Jacober, OTR/L 06/25/23, 1:51 PM  Irean Kendricks E Ethelene Closser 06/25/2023, 1:47 PM

## 2023-06-25 NOTE — Telephone Encounter (Signed)
 The daughter can't come to the hospital because she has covid, the son of the patient is coming over and checking on her. She has a wound and [place] having ATB to see if it can help. The daughter and son will work together to figure out if she needs to be in hospice, [employer] [employer] is calling the daughter to talk to her and see if they need some help with the situation

## 2023-06-25 NOTE — Consult Note (Signed)
 WOC received updated wound care orders from plastic surgery, updated wound care orders in the patient's chart. Notified ED staff again of needed supplies and wound care orders. Notified staff no WOC nurse available to perform this wound care. Uncomplicated wound care can be performed by any RN. NPWT dressing is not connected to machine therefore family and ED staff will not need to worry about machine return to SNF.   Patient to follow up with plastic surgery as scheduled after DC.   [facility], CNS, [employer] [phone]

## 2023-06-26 DIAGNOSIS — L089 Local infection of the skin and subcutaneous tissue, unspecified: Secondary | ICD-10-CM | POA: Diagnosis not present

## 2023-06-26 DIAGNOSIS — L03116 Cellulitis of left lower limb: Secondary | ICD-10-CM

## 2023-06-26 DIAGNOSIS — T148XXA Other injury of unspecified body region, initial encounter: Secondary | ICD-10-CM | POA: Diagnosis not present

## 2023-06-26 LAB — CBC WITH DIFFERENTIAL/PLATELET
Abs Immature Granulocytes: 0.25 10*3/uL — ABNORMAL HIGH (ref 0.00–0.07)
Basophils Absolute: 0.1 10*3/uL (ref 0.0–0.1)
Basophils Relative: 1 %
Eosinophils Absolute: 0.9 10*3/uL — ABNORMAL HIGH (ref 0.0–0.5)
Eosinophils Relative: 8 %
HCT: 25.5 % — ABNORMAL LOW (ref 36.0–46.0)
Hemoglobin: 7.6 g/dL — ABNORMAL LOW (ref 12.0–15.0)
Immature Granulocytes: 2 %
Lymphocytes Relative: 7 %
Lymphs Abs: 0.8 10*3/uL (ref 0.7–4.0)
MCH: 25.8 pg — ABNORMAL LOW (ref 26.0–34.0)
MCHC: 29.8 g/dL — ABNORMAL LOW (ref 30.0–36.0)
MCV: 86.4 fL (ref 80.0–100.0)
Monocytes Absolute: 1.2 10*3/uL — ABNORMAL HIGH (ref 0.1–1.0)
Monocytes Relative: 11 %
Neutro Abs: 7.2 10*3/uL (ref 1.7–7.7)
Neutrophils Relative %: 71 %
Platelets: 764 10*3/uL — ABNORMAL HIGH (ref 150–400)
RBC: 2.95 MIL/uL — ABNORMAL LOW (ref 3.87–5.11)
RDW: 23.9 % — ABNORMAL HIGH (ref 11.5–15.5)
Smear Review: NORMAL
WBC: 10.4 10*3/uL (ref 4.0–10.5)
nRBC: 0 % (ref 0.0–0.2)

## 2023-06-26 LAB — BASIC METABOLIC PANEL WITH GFR
Anion gap: 9 (ref 5–15)
BUN: 21 mg/dL (ref 8–23)
CO2: 21 mmol/L — ABNORMAL LOW (ref 22–32)
Calcium: 8.7 mg/dL — ABNORMAL LOW (ref 8.9–10.3)
Chloride: 106 mmol/L (ref 98–111)
Creatinine, Ser: 0.9 mg/dL (ref 0.44–1.00)
GFR, Estimated: 59 mL/min — ABNORMAL LOW (ref >60)
Glucose, Bld: 85 mg/dL (ref 70–99)
Potassium: 3.9 mmol/L (ref 3.5–5.1)
Sodium: 136 mmol/L (ref 135–145)

## 2023-06-26 LAB — CULTURE, BLOOD (ROUTINE X 2)

## 2023-06-26 LAB — URINE CULTURE: Culture: >=100000 — AB

## 2023-06-26 LAB — PHOSPHORUS: Phosphorus: 2.4 mg/dL — ABNORMAL LOW (ref 2.5–4.6)

## 2023-06-26 LAB — MAGNESIUM: Magnesium: 1.7 mg/dL (ref 1.7–2.4)

## 2023-06-26 MED ORDER — SODIUM CHLORIDE 0.9 % IV SOLN
1.0000 g | INTRAVENOUS | Status: AC
  Administered 2023-06-26 – 2023-06-29 (×4): 1 g via INTRAVENOUS
  Filled 2023-06-26 (×4): qty 10

## 2023-06-26 MED ORDER — MORPHINE SULFATE (PF) 2 MG/ML IV SOLN
2.0000 mg | INTRAVENOUS | Status: DC | PRN
  Administered 2023-06-26: 2 mg via INTRAVENOUS
  Filled 2023-06-26 (×2): qty 1

## 2023-06-26 MED ORDER — OXYCODONE HCL 5 MG PO TABS: 5.0000 mg | ORAL_TABLET | Freq: Four times a day (QID) | ORAL | Status: DC | PRN

## 2023-06-26 NOTE — Progress Notes (Addendum)
 Progress Note    Laura Wilcox  ZOX:[id number] DOB: 02-Aug-1926  DOA: 06/23/2023 PCP: Smiley Houseman, NP      Brief Narrative:    Medical records reviewed and are as summarized below:  Laura Wilcox is a 89 y.o. female  with medical history significant of dementia, hypothyroidism, depression with anxiety, CKD-3B, skin cancer, UTI, myelofibrosis and polycythemia rubra vera, chronic left foot wound after hematoma (VAC), chronic cough, recent discharge from [facility] with pneumonia, who [facility] with altered mental status. According to her son, patient has been confused for about a week and has had worsened in the preceding 2 days prior to admission. Oxygen saturation was in the 70s in the ED.    Assessment/Plan:   Principal Problem:   Wound infection Active Problems:   Wound of left lower extremity   UTI (urinary tract infection)   Acute metabolic encephalopathy   Acute respiratory failure with hypoxia (HCC)   Chronic kidney disease, stage 3b (HCC)   Hypothyroidism   Anemia in chronic kidney disease   Polycythemia rubra vera (HCC)   Myelofibrosis (HCC)   Depression with anxiety   Palliative care encounter   Malnutrition of moderate degree    Body mass index is 22.56 kg/m.    Left foot wound infection Staph epidermidis bacteremia (1 out of 4 bottles positive) Acute UTI Urine culture growing Klebsiella pneumoniae and Providencia stuartii Discontinue IV cefepime and Flagyl. Start IV ceftriaxone.  Continue IV vancomycin for now. Tylenol, Oxycodone and IV morphine as needed for pain CRP 3.6.  Lactic acid was normal. Case discussed with Dr. Annamary Rummage, podiatrist.  He recommended local wound care and antibiotics.  He does not recommend surgery.  He will reach out to patient's son to discuss his recommendations. Caroline More, PA, with plastic surgeon, Dr. Ulice Bold, reach out via secure chat on 06/26/2023.   She said they have been following the patient in the clinic.  She recommended "apply wet-to-dry dressings with Vashe twice daily to LLE anterior wound followed by ABD, kerlex and ACE wrap".  Staph epidermidis bacteremia is a contaminant per ID (discussed with Dr. Rivka Safer, ID specialist, on 06/25/2023).   Acute metabolic encephalopathy on underlying dementia: Mental status has improved and she is likely back to baseline   Acute hypoxic respiratory failure Improved.  She is on 2 L/min oxygen via [place]. Patient also has COPD (emphysema noted on CT chest)   CKD stage IIIb Creatinine stable   Hypoglycemia IV dextrose as needed.  Encourage adequate oral intake.   Anemia of chronic disease H&H stable.  Monitor H&H and transfuse as needed.   Decubitus ulcers (present on admission) Left heel deep tissue injury, stage II decubitus ulcers on the back Continue local wound care.   Comorbidities include polycythemia rubra vera, myelofibrosis (follows with Dr. Cathie Hoops, oncologist), protein calorie malnutrition, depression, anxiety,  hypothyroidism   Follow-up with palliative care team  Diet Order             DIET DYS 3 Room service appropriate? Yes; Fluid consistency: Thin  Diet effective now                            Consultants: Palliative care  Procedures: None    Medications:    vitamin C  500 mg Oral BID   calcitRIOL  0.25 mcg Oral Q M,W,F   cholecalciferol  1,000 Units Oral Daily   citalopram  10 mg Oral Daily   cyanocobalamin  1,000 mcg Oral Daily   feeding supplement  237 mL Oral BID BM   ferrous sulfate  325 mg Oral Daily   heparin  5,000 Units Subcutaneous Q8H   levothyroxine  25 mcg Oral Q0600   multivitamin with minerals  1 tablet Oral Daily   polyethylene glycol  17 g Oral Daily   Continuous Infusions:  cefTRIAXone (ROCEPHIN)  IV 1 g (06/26/23 1016)   vancomycin 500 mg (06/25/23 2154)     Anti-infectives (From admission, onward)    Start      Dose/Rate Route Frequency Ordered Stop   06/26/23 1000  cefTRIAXone (ROCEPHIN) 1 g in sodium chloride 0.9 % 100 mL IVPB        1 g 200 mL/hr over 30 Minutes Intravenous Every 24 hours 06/26/23 0826     06/25/23 2030  vancomycin (VANCOCIN) IVPB 1000 mg/200 mL premix  Status:  Discontinued        1,000 mg 200 mL/hr over 60 Minutes Intravenous Every 48 hours 06/24/23 0137 06/25/23 0919   06/25/23 2030  vancomycin (VANCOREADY) IVPB 500 mg/100 mL        500 mg 100 mL/hr over 60 Minutes Intravenous Every 24 hours 06/25/23 0919     06/24/23 1800  ceFEPIme (MAXIPIME) 2 g in sodium chloride 0.9 % 100 mL IVPB  Status:  Discontinued        2 g 200 mL/hr over 30 Minutes Intravenous Every 24 hours 06/24/23 0134 06/26/23 0826   06/24/23 0700  metroNIDAZOLE (FLAGYL) IVPB 500 mg  Status:  Discontinued        500 mg 100 mL/hr over 60 Minutes Intravenous Every 12 hours 06/24/23 0005 06/26/23 0805   06/23/23 1745  ceFEPIme (MAXIPIME) 2 g in sodium chloride 0.9 % 100 mL IVPB        2 g 200 mL/hr over 30 Minutes Intravenous  Once 06/23/23 1744 06/23/23 1900   06/23/23 1745  metroNIDAZOLE (FLAGYL) IVPB 500 mg        500 mg 100 mL/hr over 60 Minutes Intravenous  Once 06/23/23 1744 06/23/23 2029   06/23/23 1745  vancomycin (VANCOCIN) IVPB 1000 mg/200 mL premix        1,000 mg 200 mL/hr over 60 Minutes Intravenous  Once 06/23/23 1744 06/23/23 2133              Family Communication/Anticipated D/C date and plan/Code Status   DVT prophylaxis: heparin injection 5,000 Units Start: 06/23/23 2315     Code Status: Limited: Do not attempt resuscitation (DNR) -DNR-LIMITED -Do Not Intubate/DNI   Family Communication: None Disposition Plan: Plan to discharge to SNF   Status is: Inpatient Remains inpatient appropriate because: Wound infection       Subjective:   Interval events noted.  She complains of pain in the left foot.  Objective:    Vitals:   06/25/23 1657 06/25/23 1952 06/26/23  0312 06/26/23 0819  BP:  (!) 114/58 113/60 120/60  Pulse:  87 70 63  Resp:  16 15 15   Temp:  97.7 F (36.5 C) 97.6 F (36.4 C) 97.7 F (36.5 C)  TempSrc:  Oral Oral Oral  SpO2:  98% 99% 92%  Weight: 52.4 kg     Height:       No data found.   Intake/Output Summary (Last 24 hours) at 06/26/2023 1400 Last data filed at 06/25/2023 1831 Gross per 24 hour  Intake 401.33 ml  Output 0 ml  Net 401.33 ml   Filed Weights   06/24/23 0121 06/25/23 1657  Weight: 51.7 kg 52.4 kg    Exam:   GEN: NAD SKIN: Warm and dry.  Multiple skin bruises on bilateral upper and lower extremities.  Decubitus wounds on bilateral heels, back.  Open wound on dorsal aspect of left foot EYES: No pallor or icterus ENT: MMM CV: RRR PULM: CTA B ABD: soft, ND, NT, +BS CNS: AAO x 2 (person and place), non focal EXT: No edema or tenderness            Data Reviewed:   I have personally reviewed following labs and imaging studies:  Labs: Labs show the following:   Basic Metabolic Panel: Recent Labs  Lab 06/23/23 2241 06/24/23 0141 06/26/23 0503  NA 136 135 136  K 4.6 4.4 3.9  CL 103 101 106  CO2 23 22 21*  GLUCOSE 88 73 85  BUN 25* 26* 21  CREATININE 0.90 0.87 0.90  CALCIUM 9.1 9.0 8.7*  MG  --   --  1.7  PHOS  --   --  2.4*   GFR Estimated Creatinine Clearance: 27.5 mL/min (by C-G formula based on SCr of 0.9 mg/dL). Liver Function Tests: Recent Labs  Lab 06/23/23 2241  AST 23  ALT 10  ALKPHOS 65  BILITOT 0.9  PROT 5.9*  ALBUMIN 2.9*   No results for input(s): "LIPASE", "AMYLASE" in the last 168 hours. No results for input(s): "AMMONIA" in the last 168 hours. Coagulation profile Recent Labs  Lab 06/23/23 1830  INR 1.0    CBC: Recent Labs  Lab 06/23/23 1736 06/24/23 0141 06/26/23 0503  WBC 13.4* 13.6* 10.4  NEUTROABS 9.6*  --  7.2  HGB 9.6* 7.6* 7.6*  HCT 32.1* 25.1* 25.5*  MCV 87.5 88.1 86.4  PLT 838* 904* 764*   Cardiac Enzymes: No results for  input(s): "CKTOTAL", "CKMB", "CKMBINDEX", "TROPONINI" in the last 168 hours. BNP (last 3 results) No results for input(s): "PROBNP" in the last 8760 hours. CBG: Recent Labs  Lab 06/24/23 1931 06/24/23 2224 06/25/23 0912  GLUCAP 66* 79 60*   D-Dimer: No results for input(s): "DDIMER" in the last 72 hours. Hgb A1c: No results for input(s): "HGBA1C" in the last 72 hours. Lipid Profile: No results for input(s): "CHOL", "HDL", "LDLCALC", "TRIG", "CHOLHDL", "LDLDIRECT" in the last 72 hours. Thyroid function studies: No results for input(s): "TSH", "T4TOTAL", "T3FREE", "THYROIDAB" in the last 72 hours.  Invalid input(s): "FREET3" Anemia work up: No results for input(s): "VITAMINB12", "FOLATE", "FERRITIN", "TIBC", "IRON", "RETICCTPCT" in the last 72 hours. Sepsis Labs: Recent Labs  Lab 06/23/23 1736 06/23/23 1743 06/23/23 2241 06/24/23 0141 06/26/23 0503  WBC 13.4*  --   --  13.6* 10.4  LATICACIDVEN  --  1.8 1.1  --   --     Microbiology Recent Results (from the past 240 hours)  Blood Culture (routine x 2)     Status: Abnormal   Collection Time: 06/23/23  5:36 PM   Specimen: BLOOD  Result Value Ref Range Status   Specimen Description   Final    BLOOD BLOOD LEFT FOREARM Performed at [facility], [address], [place], [place] [address]    Special Requests   Final    BOTTLES DRAWN AEROBIC AND ANAEROBIC Blood Culture results may not be optimal due to an inadequate volume of blood received in culture bottles Performed at [facility], [address], [place], [place] [address]    Culture  Setup Time   Final    GRAM POSITIVE COCCI AEROBIC BOTTLE ONLY Organism ID to follow CRITICAL RESULT CALLED TO, READ BACK BY AND VERIFIED WITH: A. DOBBS 2006 06/24/23 LRL Performed at [facility], [address] [place], [place] [address]    Culture (A)  Final    STAPHYLOCOCCUS EPIDERMIDIS THE SIGNIFICANCE OF ISOLATING THIS ORGANISM FROM A SINGLE SET  OF BLOOD CULTURES WHEN MULTIPLE SETS ARE DRAWN IS UNCERTAIN. PLEASE NOTIFY THE MICROBIOLOGY DEPARTMENT WITHIN ONE WEEK IF SPECIATION AND SENSITIVITIES ARE REQUIRED. Performed at [facility], [address], [place], [place] [address]    Report Status 06/26/2023 FINAL  Final  Blood Culture ID Panel (Reflexed)     Status: Abnormal   Collection Time: 06/23/23  5:36 PM  Result Value Ref Range Status   Enterococcus faecalis NOT DETECTED NOT DETECTED Final   Enterococcus Faecium NOT DETECTED NOT DETECTED Final   Listeria monocytogenes NOT DETECTED NOT DETECTED Final   Staphylococcus species DETECTED (A) NOT DETECTED Final    Comment: CRITICAL RESULT CALLED TO, READ BACK BY AND VERIFIED WITH: A. DOBBS 2006 06/24/23 LRL    Staphylococcus aureus (BCID) NOT DETECTED NOT DETECTED Final   Staphylococcus epidermidis DETECTED (A) NOT DETECTED Final    Comment: Methicillin (oxacillin) resistant coagulase negative staphylococcus. Possible blood culture contaminant (unless isolated from more than one blood culture draw or clinical case suggests pathogenicity). No antibiotic treatment is indicated for blood  culture contaminants. CRITICAL RESULT CALLED TO, READ BACK BY AND VERIFIED WITH: A. DOBBS 2006 06/24/23 LRL    Staphylococcus lugdunensis NOT DETECTED NOT DETECTED Final   Streptococcus species NOT DETECTED NOT DETECTED Final   Streptococcus agalactiae NOT DETECTED NOT DETECTED Final   Streptococcus pneumoniae NOT DETECTED NOT DETECTED Final   Streptococcus pyogenes NOT DETECTED NOT DETECTED Final   A.calcoaceticus-baumannii NOT DETECTED NOT DETECTED Final   Bacteroides fragilis NOT DETECTED NOT DETECTED Final   Enterobacterales NOT DETECTED NOT DETECTED Final   Enterobacter cloacae complex NOT DETECTED NOT DETECTED Final   Escherichia coli NOT DETECTED NOT DETECTED Final   Klebsiella aerogenes NOT DETECTED NOT DETECTED Final   Klebsiella oxytoca NOT DETECTED NOT DETECTED Final   Klebsiella  pneumoniae NOT DETECTED NOT DETECTED Final   Proteus species NOT DETECTED NOT DETECTED Final   Salmonella species NOT DETECTED NOT DETECTED Final   Serratia marcescens NOT DETECTED NOT DETECTED Final   Haemophilus influenzae NOT DETECTED NOT DETECTED Final   Neisseria meningitidis NOT DETECTED NOT DETECTED Final   Pseudomonas aeruginosa NOT DETECTED NOT DETECTED Final   Stenotrophomonas maltophilia NOT DETECTED NOT DETECTED Final   Candida albicans NOT DETECTED NOT DETECTED Final   Candida auris NOT DETECTED NOT DETECTED Final   Candida glabrata NOT DETECTED NOT DETECTED Final   Candida krusei NOT DETECTED NOT DETECTED Final   Candida parapsilosis NOT DETECTED NOT DETECTED Final   Candida tropicalis NOT DETECTED NOT DETECTED Final   Cryptococcus neoformans/gattii NOT DETECTED NOT DETECTED Final   Methicillin resistance mecA/C DETECTED (A) NOT DETECTED Final    Comment: CRITICAL RESULT CALLED TO, READ BACK BY AND VERIFIED WITH: A. DOBBS 2006 06/24/23 LRL Performed at [facility], [address] [place], [place] [address]   Blood Culture (routine x 2)     Status: None (Preliminary result)   Collection Time: 06/23/23  5:48 PM   Specimen: BLOOD  Result Value Ref Range Status   Specimen Description BLOOD BLOOD RIGHT FOREARM  Final   Special Requests  Final    BOTTLES DRAWN AEROBIC AND ANAEROBIC Blood Culture results may not be optimal due to an inadequate volume of blood received in culture bottles   Culture   Final    NO GROWTH 3 DAYS Performed at [facility], [address], [place], [place] [address]    Report Status PENDING  Incomplete  Resp panel by RT-PCR (RSV, Flu A&B, Covid) Anterior Nasal Swab     Status: None   Collection Time: 06/23/23  8:45 PM   Specimen: Anterior Nasal Swab  Result Value Ref Range Status   SARS Coronavirus 2 by RT PCR NEGATIVE NEGATIVE Final    Comment: (NOTE) SARS-CoV-2 target nucleic acids are NOT DETECTED.  The SARS-CoV-2 RNA  is generally detectable in upper respiratory specimens during the acute phase of infection. The lowest concentration of SARS-CoV-2 viral copies this assay can detect is 138 copies/mL. A negative result does not preclude SARS-Cov-2 infection and should not be used as the sole basis for treatment or other patient management decisions. A negative result may occur with  improper specimen collection/handling, submission of specimen other than nasopharyngeal swab, presence of viral mutation(s) within the areas targeted by this assay, and inadequate number of viral copies(<138 copies/mL). A negative result must be combined with clinical observations, patient history, and epidemiological information. The expected result is Negative.  Fact Sheet for Patients:  BloggerCourse.com  Fact Sheet for Healthcare Providers:  SeriousBroker.it  This test is no t yet approved or cleared by the [place] FDA and  has been authorized for detection and/or diagnosis of SARS-CoV-2 by FDA under an Emergency Use Authorization (EUA). This EUA will remain  in effect (meaning this test can be used) for the duration of the COVID-19 declaration under Section 564(b)(1) of the Act, 21 U.S.C.section 360bbb-3(b)(1), unless the authorization is terminated  or revoked sooner.       Influenza A by PCR NEGATIVE NEGATIVE Final   Influenza B by PCR NEGATIVE NEGATIVE Final    Comment: (NOTE) The Xpert Xpress SARS-CoV-2/FLU/RSV plus assay is intended as an aid in the diagnosis of influenza from Nasopharyngeal swab specimens and should not be used as a sole basis for treatment. Nasal washings and aspirates are unacceptable for Xpert Xpress SARS-CoV-2/FLU/RSV testing.  Fact Sheet for Patients: BloggerCourse.com  Fact Sheet for Healthcare Providers: SeriousBroker.it  This test is not yet approved or cleared by the  [place] FDA and has been authorized for detection and/or diagnosis of SARS-CoV-2 by FDA under an Emergency Use Authorization (EUA). This EUA will remain in effect (meaning this test can be used) for the duration of the COVID-19 declaration under Section 564(b)(1) of the Act, 21 U.S.C. section 360bbb-3(b)(1), unless the authorization is terminated or revoked.     Resp Syncytial Virus by PCR NEGATIVE NEGATIVE Final    Comment: (NOTE) Fact Sheet for Patients: BloggerCourse.com  Fact Sheet for Healthcare Providers: SeriousBroker.it  This test is not yet approved or cleared by the [place] FDA and has been authorized for detection and/or diagnosis of SARS-CoV-2 by FDA under an Emergency Use Authorization (EUA). This EUA will remain in effect (meaning this test can be used) for the duration of the COVID-19 declaration under Section 564(b)(1) of the Act, 21 U.S.C. section 360bbb-3(b)(1), unless the authorization is terminated or revoked.  Performed at [facility], [address], [place], [place] [address]   Urine Culture     Status: Abnormal   Collection Time: 06/23/23  9:23 PM   Specimen:  Urine, Random  Result Value Ref Range Status   Specimen Description   Final    URINE, RANDOM Performed at [facility], [address] [place], [place] [address]    Special Requests   Final    NONE Reflexed from [phone] Performed at [facility], [address] [place], [place] [address]    Culture (A)  Final    >=100,000 COLONIES/mL KLEBSIELLA PNEUMONIAE >=100,000 COLONIES/mL PROVIDENCIA STUARTII    Report Status 06/26/2023 FINAL  Final   Organism ID, Bacteria KLEBSIELLA PNEUMONIAE (A)  Final   Organism ID, Bacteria PROVIDENCIA STUARTII (A)  Final      Susceptibility   Klebsiella pneumoniae - MIC*    AMPICILLIN >=32 RESISTANT Resistant     CEFAZOLIN <=4 SENSITIVE Sensitive     CEFEPIME <=0.12  SENSITIVE Sensitive     CEFTRIAXONE <=0.25 SENSITIVE Sensitive     CIPROFLOXACIN <=0.25 SENSITIVE Sensitive     GENTAMICIN <=1 SENSITIVE Sensitive     IMIPENEM <=0.25 SENSITIVE Sensitive     NITROFURANTOIN 32 SENSITIVE Sensitive     TRIMETH/SULFA <=20 SENSITIVE Sensitive     AMPICILLIN/SULBACTAM 8 SENSITIVE Sensitive     PIP/TAZO 16 SENSITIVE Sensitive ug/mL    * >=100,000 COLONIES/mL KLEBSIELLA PNEUMONIAE   Providencia stuartii - MIC*    AMPICILLIN >=32 RESISTANT Resistant     CEFEPIME <=0.12 SENSITIVE Sensitive     CEFTRIAXONE 0.5 SENSITIVE Sensitive     CIPROFLOXACIN 2 RESISTANT Resistant     GENTAMICIN 4 SENSITIVE Sensitive     IMIPENEM 2 SENSITIVE Sensitive     NITROFURANTOIN 256 RESISTANT Resistant     TRIMETH/SULFA >=320 RESISTANT Resistant     AMPICILLIN/SULBACTAM >=32 RESISTANT Resistant     PIP/TAZO 8 SENSITIVE Sensitive ug/mL    * >=100,000 COLONIES/mL PROVIDENCIA STUARTII  Respiratory (~20 pathogens) panel by PCR     Status: None   Collection Time: 06/24/23  4:57 AM   Specimen: Nasopharyngeal Swab; Respiratory  Result Value Ref Range Status   Adenovirus NOT DETECTED NOT DETECTED Final   Coronavirus 229E NOT DETECTED NOT DETECTED Final    Comment: (NOTE) The Coronavirus on the Respiratory Panel, DOES NOT test for the novel  Coronavirus (2019 nCoV)    Coronavirus HKU1 NOT DETECTED NOT DETECTED Final   Coronavirus NL63 NOT DETECTED NOT DETECTED Final   Coronavirus OC43 NOT DETECTED NOT DETECTED Final   Metapneumovirus NOT DETECTED NOT DETECTED Final   Rhinovirus / Enterovirus NOT DETECTED NOT DETECTED Final   Influenza A NOT DETECTED NOT DETECTED Final   Influenza B NOT DETECTED NOT DETECTED Final   Parainfluenza Virus 1 NOT DETECTED NOT DETECTED Final   Parainfluenza Virus 2 NOT DETECTED NOT DETECTED Final   Parainfluenza Virus 3 NOT DETECTED NOT DETECTED Final   Parainfluenza Virus 4 NOT DETECTED NOT DETECTED Final   Respiratory Syncytial Virus NOT DETECTED NOT  DETECTED Final   Bordetella pertussis NOT DETECTED NOT DETECTED Final   Bordetella Parapertussis NOT DETECTED NOT DETECTED Final   Chlamydophila pneumoniae NOT DETECTED NOT DETECTED Final   Mycoplasma pneumoniae NOT DETECTED NOT DETECTED Final    Comment: Performed at [facility], [address] [place], [place] [address]    Procedures and diagnostic studies:  No results found.              LOS: 2 days   Camilah Spillman  [facility]   Pager on [email or web address]. If 7PM-7AM, please contact night-coverage at [email or web address]     06/26/2023, 2:00  PM

## 2023-06-26 NOTE — Consult Note (Signed)
 PODIATRY CONSULTATION  NAME Laura Wilcox MRN [id number] DOB [birth year] DOA 06/23/2023   Reason for consult:  Chief Complaint  Patient presents with   Altered Mental Status    Attending/Consulting physician:   History of present illness: " 89 y.o. female with medical history significant of dementia, hypothyroidism, depression with anxiety, CKD-3B, skin cancer, UTI, myelofibrosis and polycythemia rubra vera, chronic left foot wound after hematoma (VAC), who presents with altered mental status.   Patient has AMS, and is unable to provide any medical history. I called her son by phone, who provided medical history.   Per his son, patient has been confused for most a week which has worsened in the past 2 days.  At her normal baseline, patient is talking, orientated to person and place, but not to the time.  When I saw patient in ED, patient is not oriented x 3.  She moves all extremities on painful stimuli.  Patient has acute respiratory distress, with oxygen desaturation to 70% per nurse report, nonrebreather was started with 100% of saturation.  Patient is not using oxygen normally.  Per her son, patient has chronic cough, does not seem to have chest pain. Patient was recently treated for pneumonia.  No nausea, vomiting, diarrhea or abdominal pain.  Not sure if patient has symptoms of UTI.  Of note, patient has chronic wound in left foot which is sequela of big hematoma.  Patient is following up with plastic surgeon. Per her son, pt has pus drainage from the wound. "  No family bedside and patient just groans in pain when attempting to assess wound.     Past Medical History:  Diagnosis Date   Acute metabolic encephalopathy 10/22/2020   Complication of anesthesia    pt gets confused a day after receiving anesthesia per the daughter   Confusion 12/30/2018   Dementia (HCC)    Hypothyroidism    Polycythemia    SCC (squamous cell carcinoma) 04/11/2021   R med dorsum hand - ED&C SCCIS    SCC (squamous cell carcinoma) 10/09/2021   left lateral eye, EDC   SCC (squamous cell carcinoma) 01/28/2022   left lateral eye, recurrent, EDC. 5FU/Calcipotriene cream.   SCC (squamous cell carcinoma) 01/28/2022   in situ, left medial cheek, 5FU/Calcipotriene Cr   SCC (squamous cell carcinoma) 02/26/2022   SCC IS, Right Ulnar Dorsal Hand, 5FU/Calcipotriene cream   SCC (squamous cell carcinoma) 09/03/2022   R ankle, EDC   SCC (squamous cell carcinoma) 01/20/2023   SCC IS Left medial lower leg, EDC   Squamous cell carcinoma of skin 09/06/2020   R thumb - ED&C   Urinary tract infection without hematuria 01/02/2020   Urticaria 06/02/2017       Latest Ref Rng & Units 06/26/2023    5:03 AM 06/24/2023    1:41 AM 06/23/2023    5:36 PM  CBC  WBC 4.0 - 10.5 K/uL 10.4  13.6  13.4   Hemoglobin 12.0 - 15.0 g/dL 7.6  7.6  9.6   Hematocrit 36.0 - 46.0 % 25.5  25.1  32.1   Platelets 150 - 400 K/uL 764  904  838        Latest Ref Rng & Units 06/26/2023    5:03 AM 06/24/2023    1:41 AM 06/23/2023   10:41 PM  BMP  Glucose 70 - 99 mg/dL 85  73  88   BUN 8 - 23 mg/dL 21  26  25    Creatinine 0.44 - 1.00 mg/dL  0.90  0.87  0.90   Sodium 135 - 145 mmol/L 136  135  136   Potassium 3.5 - 5.1 mmol/L 3.9  4.4  4.6   Chloride 98 - 111 mmol/L 106  101  103   CO2 22 - 32 mmol/L 21  22  23    Calcium 8.9 - 10.3 mg/dL 8.7  9.0  9.1       Physical Exam: Lower Extremity Exam Scattered wounds above dressing LLE. Unable to assess main ulcer as patient in too much pain when attempting to remove dressing.     ASSESSMENT/PLAN OF CARE 89 y.o. female with PMHx significant for  dementia, hypothyroidism, depression with anxiety, CKD-3B, skin cancer, UTI, myelofibrosis and polycythemia rubra vera, with chronic left foot/ankle ulcerations.   WBC 10.4 (13.6) AF, VSS  - Recommend local wound care as currently ordered by [facility]. Appears there was some communication with Dr. Ulice Bold and [facility] with further orders.  Dr. Ulice Bold has been managing patients wounds, if further surgery is sought (which I do not recommend) I recommend transfer to [place] for her evaluation and management. I will call patients son to discuss these recommendations.  - Abx broad spectrum, may be best to involve ID for further long term abx recs if not going hospice/palliative route.  - Anticoagulation: per primary - Wound care: Continue per [facility]. Recommend addition of pain meds for dsg change pain - WB status: WBAT - Will sign off at this time.   Thank you for the consult.  Please contact me directly with any questions or concerns.           Corinna Gab, DPM [facility] / [facility]    [address] [place], [place] [address]                Office [phone]  Fax [phone]

## 2023-06-26 NOTE — TOC Progression Note (Signed)
 Transition of Care [facility]) - Progression Note    Patient Details  Name: Laura Wilcox MRN: [id number] Date of Birth: 09-11-1926  [facility] CM/SW Contact  Chapman Fitch, RN Phone Number: 06/26/2023, 4:28 PM  Clinical Narrative:     Spoke with son.  He states that patient will not be returning to [facility] at discharge.  He request referral be sent to  - [facility] - VM left for admissions - [facility] [place] and [facility] - referral sent in [place]       Expected Discharge Plan and Services                                               Social Determinants of Health (SDOH) Interventions SDOH Screenings   Food Insecurity: Patient Unable To Answer (06/24/2023)  Housing: Patient Unable To Answer (06/24/2023)  Transportation Needs: Patient Unable To Answer (06/24/2023)  Utilities: Patient Unable To Answer (06/24/2023)  Alcohol Screen: Low Risk  (06/18/2021)  Depression (PHQ2-9): High Risk (06/18/2021)  Social Connections: Patient Unable To Answer (06/24/2023)  Tobacco Use: Medium Risk (06/24/2023)    Readmission Risk Interventions    06/24/2023    2:03 PM 06/26/2021    4:26 PM  Readmission Risk Prevention Plan  Transportation Screening Complete Complete  PCP or Specialist Appt within 5-7 Days Complete Complete  Home Care Screening Complete Complete  Medication Review (RN CM) Complete Complete

## 2023-06-26 NOTE — Telephone Encounter (Signed)
 I spoke with the patient's daughter.  Discussed with her that I reviewed the images and based off of the images, we recommended Vashe soaked gauze twice.  Discussed with daughter that I sent these recommendations to the hospitalist team over at [facility].  Discussed with patient's daughter that patient's wound healing will take a long time due to several reasons including health and mobility status and her nutritional status.  Patient's daughter expressed understanding.  I discussed with the daughter that if she has any questions or concerns about anything to let us know.  Otherwise we can see the patient in the clinic when she is discharged.  Patient's daughter expressed understanding.

## 2023-06-26 NOTE — [place] FL2 (Signed)
 [place] MEDICAID FL2 LEVEL OF CARE FORM     IDENTIFICATION  Patient Name: Laura Wilcox Birthdate: 17-Mar-1927 Sex: female Admission Date (Current Location): 06/23/2023  [facility] and [place] Number:  Chiropodist and Address:         Provider Number: [id number]  Attending Physician Name and Address:  Lurene Shadow, MD  Relative Name and Phone Number:       Current Level of Care: Hospital Recommended Level of Care: Skilled Nursing Facility Prior Approval Number:    Date Approved/Denied:   PASRR Number: [id number]  Discharge Plan: SNF    Current Diagnoses: Patient Active Problem List   Diagnosis Date Noted   Malnutrition of moderate degree 06/25/2023   Palliative care encounter 06/24/2023   Wound of left lower extremity 06/23/2023   Hypothyroidism 06/23/2023   Depression with anxiety 06/23/2023   Acute respiratory failure with hypoxia (HCC) 06/23/2023   Wound infection 06/23/2023   Leg wound, left 06/09/2023   Wound of left leg 04/21/2023   Cellulitis 04/13/2023   Hematoma of left ankle 03/31/2023   Hematoma 03/31/2023   SCC (squamous cell carcinoma) 03/19/2022   Skin tear of lower leg without complication, right, initial encounter 09/24/2021   Sepsis (HCC) 09/24/2021   Leg wound, right 09/24/2021   Encounter for antineoplastic chemotherapy 09/21/2021   Senile dementia with acute confusional state, without behavioral disturbance (HCC) 06/26/2021   Depression 06/26/2021   Weakness 06/26/2021   UTI (urinary tract infection) 06/25/2021   Acute metabolic encephalopathy 10/22/2020   Myelofibrosis (HCC) 01/31/2020   Other pancytopenia (HCC) 01/31/2020   Moderate protein-calorie malnutrition (HCC)    Squamous cell cancer of skin of left cheek 01/16/2020   Chronic kidney disease, stage 3b (HCC) 03/11/2019   Secondary hyperparathyroidism of renal origin (HCC) 03/11/2019   Anemia in chronic kidney disease 03/11/2019   Elevated ferritin 02/18/2018    Goals of care, counseling/discussion 07/09/2017   Polycythemia rubra vera (HCC) 06/02/2017   Advanced care planning/counseling discussion 06/02/2017   Age-related cognitive decline 06/02/2017    Orientation RESPIRATION BLADDER Height & Weight     Self, Place  O2 (2l [place]) Incontinent Weight: 52.4 kg Height:  5' (152.4 cm)  BEHAVIORAL SYMPTOMS/MOOD NEUROLOGICAL BOWEL NUTRITION STATUS      Continent Diet (dys 3)  AMBULATORY STATUS COMMUNICATION OF NEEDS Skin   Extensive Assist   PU Stage and Appropriate Care, Bruising, Other (Comment) (skin tear)                       Personal Care Assistance Level of Assistance              Functional Limitations Info  Hearing   Hearing Info: Impaired      SPECIAL CARE FACTORS FREQUENCY  PT (By licensed PT), OT (By licensed OT)                    Contractures Contractures Info: Not present    Additional Factors Info  Code Status, Allergies Code Status Info: full Allergies Info: Bactrim (Sulfamethoxazole-trimethoprim) Epinephrine Other           Current Medications (06/26/2023):  This is the current hospital active medication list Current Facility-Administered Medications  Medication Dose Route Frequency Provider Last Rate Last Admin   acetaminophen (TYLENOL) tablet 650 mg  650 mg Oral Q6H PRN Lorretta Harp, MD   650 mg at 06/26/23 1013   albuterol (PROVENTIL) (2.5 MG/3ML) 0.083% nebulizer solution 2.5 mg  2.5 mg  Nebulization Q4H PRN Lorretta Harp, MD       ascorbic acid (VITAMIN C) tablet 500 mg  500 mg Oral BID Lurene Shadow, MD   500 mg at 06/26/23 1013   calcitRIOL (ROCALTROL) capsule 0.25 mcg  0.25 mcg Oral Q M,W,F Lurene Shadow, MD   0.25 mcg at 06/25/23 1630   cefTRIAXone (ROCEPHIN) 1 g in sodium chloride 0.9 % 100 mL IVPB  1 g Intravenous Q24H Lurene Shadow, MD 200 mL/hr at 06/26/23 1016 1 g at 06/26/23 1016   cholecalciferol (VITAMIN D3) tablet 1,000 Units  1,000 Units Oral Daily Lurene Shadow, MD   1,000 Units at  06/26/23 1012   citalopram (CELEXA) tablet 10 mg  10 mg Oral Daily Lurene Shadow, MD   10 mg at 06/26/23 1012   cyanocobalamin (VITAMIN B12) tablet 1,000 mcg  1,000 mcg Oral Daily Lurene Shadow, MD   1,000 mcg at 06/26/23 1013   feeding supplement (ENSURE ENLIVE / ENSURE PLUS) liquid 237 mL  237 mL Oral BID BM Lurene Shadow, MD   237 mL at 06/26/23 1023   ferrous sulfate tablet 325 mg  325 mg Oral Daily Lurene Shadow, MD   325 mg at 06/26/23 1013   heparin injection 5,000 Units  5,000 Units Subcutaneous Q8H Lorretta Harp, MD   5,000 Units at 06/26/23 1610   hydrOXYzine (ATARAX) tablet 25 mg  25 mg Oral Q8H PRN Lurene Shadow, MD       levothyroxine (SYNTHROID) tablet 25 mcg  25 mcg Oral Q0600 Lurene Shadow, MD   25 mcg at 06/26/23 9604   morphine (PF) 2 MG/ML injection 2 mg  2 mg Intravenous Q4H PRN Lurene Shadow, MD       multivitamin with minerals tablet 1 tablet  1 tablet Oral Daily Lurene Shadow, MD   1 tablet at 06/26/23 1012   ondansetron (ZOFRAN) injection 4 mg  4 mg Intravenous Q8H PRN Lorretta Harp, MD   4 mg at 06/24/23 2128   oxyCODONE (Oxy IR/ROXICODONE) immediate release tablet 5 mg  5 mg Oral Q6H PRN Lurene Shadow, MD       polyethylene glycol (MIRALAX / GLYCOLAX) packet 17 g  17 g Oral Daily Lurene Shadow, MD   17 g at 06/26/23 1013   traZODone (DESYREL) tablet 25 mg  25 mg Oral QHS PRN Lurene Shadow, MD       vancomycin (VANCOREADY) IVPB 500 mg/100 mL  500 mg Intravenous Q24H Rockwell Alexandria, RPH 100 mL/hr at 06/25/23 2154 500 mg at 06/25/23 2154     Discharge Medications: Please see discharge summary for a list of discharge medications.  Relevant Imaging Results:  Relevant Lab Results:   Additional Information SSN [id number]  Chapman Fitch, RN

## 2023-06-27 DIAGNOSIS — T148XXA Other injury of unspecified body region, initial encounter: Secondary | ICD-10-CM | POA: Diagnosis not present

## 2023-06-27 DIAGNOSIS — L089 Local infection of the skin and subcutaneous tissue, unspecified: Secondary | ICD-10-CM | POA: Diagnosis not present

## 2023-06-27 LAB — PHOSPHORUS: Phosphorus: 2 mg/dL — ABNORMAL LOW (ref 2.5–4.6)

## 2023-06-27 LAB — MAGNESIUM: Magnesium: 1.7 mg/dL (ref 1.7–2.4)

## 2023-06-27 MED ORDER — ASPIRIN 81 MG PO TBEC
81.0000 mg | DELAYED_RELEASE_TABLET | Freq: Every day | ORAL | Status: DC
  Administered 2023-06-27 – 2023-07-01 (×5): 81 mg via ORAL
  Filled 2023-06-27 (×5): qty 1

## 2023-06-27 MED ORDER — SENNOSIDES-DOCUSATE SODIUM 8.6-50 MG PO TABS
2.0000 | ORAL_TABLET | Freq: Two times a day (BID) | ORAL | Status: DC
  Administered 2023-06-27 – 2023-07-01 (×9): 2 via ORAL
  Filled 2023-06-27 (×9): qty 2

## 2023-06-27 MED ORDER — K PHOS MONO-SOD PHOS DI & MONO 155-852-130 MG PO TABS
500.0000 mg | ORAL_TABLET | Freq: Three times a day (TID) | ORAL | Status: AC
  Administered 2023-06-27 (×3): 500 mg via ORAL
  Filled 2023-06-27 (×3): qty 2

## 2023-06-27 MED ORDER — OXYCODONE HCL 5 MG PO TABS
2.5000 mg | ORAL_TABLET | Freq: Three times a day (TID) | ORAL | Status: DC | PRN
  Administered 2023-06-28 – 2023-06-30 (×5): 2.5 mg via ORAL
  Filled 2023-06-27 (×6): qty 1

## 2023-06-27 NOTE — TOC Progression Note (Addendum)
 Transition of Care [facility]) - Progression Note    Patient Details  Name: Laura Wilcox MRN: [id number] Date of Birth: Apr 06, 1926  [facility]) CM/SW Contact  Chapman Fitch, RN Phone Number: 06/27/2023, 11:17 AM  Clinical Narrative:      - Spoke with Nita Sells at [facility], referral faxed and she is to review - VM left for son Craige Cotta requesting return call in order to review bed offers    Update:  Received call from son Craige Cotta. He is aware that [facility] is reviewing.  Provided him with bed offers from [facility] and [facility]  Expected Discharge Plan and Services                                               Social Determinants of Health (SDOH) Interventions SDOH Screenings   Food Insecurity: Patient Unable To Answer (06/24/2023)  Housing: Patient Unable To Answer (06/24/2023)  Transportation Needs: Patient Unable To Answer (06/24/2023)  Utilities: Patient Unable To Answer (06/24/2023)  Alcohol Screen: Low Risk  (06/18/2021)  Depression (PHQ2-9): High Risk (06/18/2021)  Social Connections: Patient Unable To Answer (06/24/2023)  Tobacco Use: Medium Risk (06/24/2023)    Readmission Risk Interventions    06/24/2023    2:03 PM 06/26/2021    4:26 PM  Readmission Risk Prevention Plan  Transportation Screening Complete Complete  PCP or Specialist Appt within 5-7 Days Complete Complete  Home Care Screening Complete Complete  Medication Review (RN CM) Complete Complete

## 2023-06-27 NOTE — Care Management Important Message (Signed)
 Important Message  Patient Details  Name: Laura Wilcox MRN: [id number] Date of Birth: 06/08/1926   Important Message Given:  Yes - Medicare IM  Patient is under precautions so gave the IM to unit secretary to pass on to covering nurse to deliver.  SCS   Terrion Gencarelli, Stephan Minister 06/27/2023, 10:34 AM

## 2023-06-27 NOTE — Progress Notes (Signed)
       CROSS COVER NOTE  NAME: Laura Wilcox MRN: [id number] DOB : 11/17/1926 ATTENDING PHYSICIAN: Lurene Shadow, MD    Date of Service   06/27/2023   HPI/Events of Note   Nurse informed me of urine culture + klebsiella pneumoniae and providencia stuartii (MDRO)   Interventions   Assessment/Plan: Contract isolation orderd        Donnie Mesa NP [facility] Cross Cover 7pm-7am - check amion for availability Pager [phone]

## 2023-06-27 NOTE — Progress Notes (Signed)
 Wound care complete

## 2023-06-27 NOTE — Progress Notes (Addendum)
 Progress Note    Laura Wilcox  IEP:[id number] DOB: [birth year]  DOA: 06/23/2023 PCP: Smiley Houseman, NP      Brief Narrative:    Medical records reviewed and are as summarized below:  Laura Wilcox is a 89 y.o. female  with medical history significant of dementia, hypothyroidism, depression with anxiety, CKD-3B, skin cancer, UTI, myelofibrosis and polycythemia rubra vera, chronic left foot wound after hematoma (VAC), chronic cough, recent discharge from [facility] with pneumonia, who [facility] with altered mental status. According to her son, patient has been confused for about a week and has had worsened in the preceding 2 days prior to admission. Oxygen saturation was in the 70s in the ED.    Assessment/Plan:   Principal Problem:   Wound infection Active Problems:   Wound of left lower extremity   UTI (urinary tract infection)   Acute metabolic encephalopathy   Acute respiratory failure with hypoxia (HCC)   Chronic kidney disease, stage 3b (HCC)   Hypothyroidism   Anemia in chronic kidney disease   Polycythemia rubra vera (HCC)   Myelofibrosis (HCC)   Depression with anxiety   Palliative care encounter   Malnutrition of moderate degree    Body mass index is 10.94 kg/m.    Left foot wound infection Acute UTI Urine culture growing Klebsiella pneumoniae and Providencia stuartii Continue IV ceftriaxone. Discontinue IV vancomycin. Continue local wound care. Tylenol and oxycodone as needed for pain. Craige Cotta (son) said that patient does not tolerate oxycodone very well and requested that oxycodone dose be decreased. He also requested that morphine be discontinued because sometimes this affects her mental status. CRP 3.6.  Lactic acid was normal. Staph epidermidis isolated in 1 blood culture bottle is a contaminant Outpatient follow-up with podiatrist and plastic surgeon.   Acute metabolic encephalopathy on  underlying dementia: Mental status has improved and she is likely back to baseline   Acute hypoxic respiratory failure Improved.  She is tolerating room air Patient also has COPD (emphysema noted on CT chest)   CKD stage IIIb Creatinine stable   Hypoglycemia IV dextrose as needed.  Encourage adequate oral intake.   Hypophosphatemia Replete phosphorus and monitor levels   Anemia of chronic disease H&H stable.  Monitor H&H and transfuse as needed.   Decubitus ulcers (present on admission) Bilateral heel deep tissue injury, stage II decubitus ulcers on the back Continue local wound care.   Comorbidities include polycythemia rubra vera, myelofibrosis (follows with Dr. Cathie Hoops, oncologist), protein calorie malnutrition, depression, anxiety,  hypothyroidism  Plan of care was discussed with her son at the bedside.  He said he spoke with Dr. Annamary Rummage on 06/26/2023 about patient's foot.  He understands that wound may not heal or may take a very long time to heal.  He said he is not interested in hospice at this time.  Plan to discharge to SNF.  Follow-up with TOC to assist with disposition.  Diet Order             DIET DYS 3 Room service appropriate? Yes; Fluid consistency: Thin  Diet effective now                            Consultants: Palliative care Podiatrist  Procedures: None    Medications:    vitamin C  500 mg Oral BID   aspirin EC  81 mg Oral Daily   calcitRIOL  0.25 mcg  Oral Q M,W,F   cholecalciferol  1,000 Units Oral Daily   citalopram  10 mg Oral Daily   cyanocobalamin  1,000 mcg Oral Daily   feeding supplement  237 mL Oral BID BM   ferrous sulfate  325 mg Oral Daily   heparin  5,000 Units Subcutaneous Q8H   levothyroxine  25 mcg Oral Q0600   multivitamin with minerals  1 tablet Oral Daily   phosphorus  500 mg Oral TID   polyethylene glycol  17 g Oral Daily   senna-docusate  2 tablet Oral BID   Continuous Infusions:  cefTRIAXone  (ROCEPHIN)  IV 1 g (06/27/23 0936)   vancomycin 500 mg (06/26/23 2031)     Anti-infectives (From admission, onward)    Start     Dose/Rate Route Frequency Ordered Stop   06/26/23 1000  cefTRIAXone (ROCEPHIN) 1 g in sodium chloride 0.9 % 100 mL IVPB        1 g 200 mL/hr over 30 Minutes Intravenous Every 24 hours 06/26/23 0826     06/25/23 2030  vancomycin (VANCOCIN) IVPB 1000 mg/200 mL premix  Status:  Discontinued        1,000 mg 200 mL/hr over 60 Minutes Intravenous Every 48 hours 06/24/23 0137 06/25/23 0919   06/25/23 2030  vancomycin (VANCOREADY) IVPB 500 mg/100 mL        500 mg 100 mL/hr over 60 Minutes Intravenous Every 24 hours 06/25/23 0919     06/24/23 1800  ceFEPIme (MAXIPIME) 2 g in sodium chloride 0.9 % 100 mL IVPB  Status:  Discontinued        2 g 200 mL/hr over 30 Minutes Intravenous Every 24 hours 06/24/23 0134 06/26/23 0826   06/24/23 0700  metroNIDAZOLE (FLAGYL) IVPB 500 mg  Status:  Discontinued        500 mg 100 mL/hr over 60 Minutes Intravenous Every 12 hours 06/24/23 0005 06/26/23 0805   06/23/23 1745  ceFEPIme (MAXIPIME) 2 g in sodium chloride 0.9 % 100 mL IVPB        2 g 200 mL/hr over 30 Minutes Intravenous  Once 06/23/23 1744 06/23/23 1900   06/23/23 1745  metroNIDAZOLE (FLAGYL) IVPB 500 mg        500 mg 100 mL/hr over 60 Minutes Intravenous  Once 06/23/23 1744 06/23/23 2029   06/23/23 1745  vancomycin (VANCOCIN) IVPB 1000 mg/200 mL premix        1,000 mg 200 mL/hr over 60 Minutes Intravenous  Once 06/23/23 1744 06/23/23 2133              Family Communication/Anticipated D/C date and plan/Code Status   DVT prophylaxis: heparin injection 5,000 Units Start: 06/23/23 2315     Code Status: Limited: Do not attempt resuscitation (DNR) -DNR-LIMITED -Do Not Intubate/DNI   Family Communication: Plan discussed with the son at the bedside Disposition Plan: Plan to discharge to SNF   Status is: Inpatient Remains inpatient appropriate because: Wound  infection       Subjective:   Interval events noted.  She complains of pain in the left foot and left leg.  Her son was at the bedside.  Nursing student was also at the bedside.  Objective:    Vitals:   06/27/23 0314 06/27/23 0344 06/27/23 0814 06/27/23 1155  BP: (!) 120/59  113/73 (!) 106/58  Pulse: 76  81 76  Resp: 20  20 18   Temp: 97.6 F (36.4 C)  98.1 F (36.7 C) 97.8 F (36.6 C)  TempSrc:  Oral Oral  SpO2: 98%  100% 96%  Weight:  25.4 kg    Height:       No data found.   Intake/Output Summary (Last 24 hours) at 06/27/2023 1417 Last data filed at 06/27/2023 1056 Gross per 24 hour  Intake 170 ml  Output --  Net 170 ml   Filed Weights   06/24/23 0121 06/25/23 1657 06/27/23 0344  Weight: 51.7 kg 52.4 kg 25.4 kg    Exam:  GEN: NAD SKIN: Warm and dry.  Multiple bruises on upper and lower extremities.  Dressing on left foot wound is clean, dry and intact EYES: No pallor or icterus ENT: MMM, hearing impairment CV: RRR PULM: CTA B ABD: soft, ND, NT, +BS CNS: AAO x 1 (person), non focal EXT: Mild tenderness left leg          Data Reviewed:   I have personally reviewed following labs and imaging studies:  Labs: Labs show the following:   Basic Metabolic Panel: Recent Labs  Lab 06/23/23 2241 06/24/23 0141 06/26/23 0503 06/27/23 0517  NA 136 135 136  --   K 4.6 4.4 3.9  --   CL 103 101 106  --   CO2 23 22 21*  --   GLUCOSE 88 73 85  --   BUN 25* 26* 21  --   CREATININE 0.90 0.87 0.90  --   CALCIUM 9.1 9.0 8.7*  --   MG  --   --  1.7 1.7  PHOS  --   --  2.4* 2.0*   GFR Estimated Creatinine Clearance: 15.3 mL/min (by C-G formula based on SCr of 0.9 mg/dL). Liver Function Tests: Recent Labs  Lab 06/23/23 2241  AST 23  ALT 10  ALKPHOS 65  BILITOT 0.9  PROT 5.9*  ALBUMIN 2.9*   No results for input(s): "LIPASE", "AMYLASE" in the last 168 hours. No results for input(s): "AMMONIA" in the last 168 hours. Coagulation  profile Recent Labs  Lab 06/23/23 1830  INR 1.0    CBC: Recent Labs  Lab 06/23/23 1736 06/24/23 0141 06/26/23 0503  WBC 13.4* 13.6* 10.4  NEUTROABS 9.6*  --  7.2  HGB 9.6* 7.6* 7.6*  HCT 32.1* 25.1* 25.5*  MCV 87.5 88.1 86.4  PLT 838* 904* 764*   Cardiac Enzymes: No results for input(s): "CKTOTAL", "CKMB", "CKMBINDEX", "TROPONINI" in the last 168 hours. BNP (last 3 results) No results for input(s): "PROBNP" in the last 8760 hours. CBG: Recent Labs  Lab 06/24/23 1931 06/24/23 2224 06/25/23 0912  GLUCAP 66* 79 60*   D-Dimer: No results for input(s): "DDIMER" in the last 72 hours. Hgb A1c: No results for input(s): "HGBA1C" in the last 72 hours. Lipid Profile: No results for input(s): "CHOL", "HDL", "LDLCALC", "TRIG", "CHOLHDL", "LDLDIRECT" in the last 72 hours. Thyroid function studies: No results for input(s): "TSH", "T4TOTAL", "T3FREE", "THYROIDAB" in the last 72 hours.  Invalid input(s): "FREET3" Anemia work up: No results for input(s): "VITAMINB12", "FOLATE", "FERRITIN", "TIBC", "IRON", "RETICCTPCT" in the last 72 hours. Sepsis Labs: Recent Labs  Lab 06/23/23 1736 06/23/23 1743 06/23/23 2241 06/24/23 0141 06/26/23 0503  WBC 13.4*  --   --  13.6* 10.4  LATICACIDVEN  --  1.8 1.1  --   --     Microbiology Recent Results (from the past 240 hours)  Blood Culture (routine x 2)     Status: Abnormal   Collection Time: 06/23/23  5:36 PM   Specimen: BLOOD  Result Value Ref Range Status  Specimen Description   Final    BLOOD BLOOD LEFT FOREARM Performed at [facility], [address] [place], [place] [address]    Special Requests   Final    BOTTLES DRAWN AEROBIC AND ANAEROBIC Blood Culture results may not be optimal due to an inadequate volume of blood received in culture bottles Performed at [facility], [address] [place], [place] [address]    Culture  Setup Time   Final    GRAM POSITIVE COCCI AEROBIC BOTTLE ONLY Organism  ID to follow CRITICAL RESULT CALLED TO, READ BACK BY AND VERIFIED WITH: A. DOBBS 2006 06/24/23 LRL Performed at [facility], [address] [place], [place] [address]    Culture (A)  Final    STAPHYLOCOCCUS EPIDERMIDIS THE SIGNIFICANCE OF ISOLATING THIS ORGANISM FROM A SINGLE SET OF BLOOD CULTURES WHEN MULTIPLE SETS ARE DRAWN IS UNCERTAIN. PLEASE NOTIFY THE MICROBIOLOGY DEPARTMENT WITHIN ONE WEEK IF SPECIATION AND SENSITIVITIES ARE REQUIRED. Performed at [facility], [address], [place], [place] [address]    Report Status 06/26/2023 FINAL  Final  Blood Culture ID Panel (Reflexed)     Status: Abnormal   Collection Time: 06/23/23  5:36 PM  Result Value Ref Range Status   Enterococcus faecalis NOT DETECTED NOT DETECTED Final   Enterococcus Faecium NOT DETECTED NOT DETECTED Final   Listeria monocytogenes NOT DETECTED NOT DETECTED Final   Staphylococcus species DETECTED (A) NOT DETECTED Final    Comment: CRITICAL RESULT CALLED TO, READ BACK BY AND VERIFIED WITH: A. DOBBS 2006 06/24/23 LRL    Staphylococcus aureus (BCID) NOT DETECTED NOT DETECTED Final   Staphylococcus epidermidis DETECTED (A) NOT DETECTED Final    Comment: Methicillin (oxacillin) resistant coagulase negative staphylococcus. Possible blood culture contaminant (unless isolated from more than one blood culture draw or clinical case suggests pathogenicity). No antibiotic treatment is indicated for blood  culture contaminants. CRITICAL RESULT CALLED TO, READ BACK BY AND VERIFIED WITH: A. DOBBS 2006 06/24/23 LRL    Staphylococcus lugdunensis NOT DETECTED NOT DETECTED Final   Streptococcus species NOT DETECTED NOT DETECTED Final   Streptococcus agalactiae NOT DETECTED NOT DETECTED Final   Streptococcus pneumoniae NOT DETECTED NOT DETECTED Final   Streptococcus pyogenes NOT DETECTED NOT DETECTED Final   A.calcoaceticus-baumannii NOT DETECTED NOT DETECTED Final   Bacteroides fragilis NOT DETECTED NOT  DETECTED Final   Enterobacterales NOT DETECTED NOT DETECTED Final   Enterobacter cloacae complex NOT DETECTED NOT DETECTED Final   Escherichia coli NOT DETECTED NOT DETECTED Final   Klebsiella aerogenes NOT DETECTED NOT DETECTED Final   Klebsiella oxytoca NOT DETECTED NOT DETECTED Final   Klebsiella pneumoniae NOT DETECTED NOT DETECTED Final   Proteus species NOT DETECTED NOT DETECTED Final   Salmonella species NOT DETECTED NOT DETECTED Final   Serratia marcescens NOT DETECTED NOT DETECTED Final   Haemophilus influenzae NOT DETECTED NOT DETECTED Final   Neisseria meningitidis NOT DETECTED NOT DETECTED Final   Pseudomonas aeruginosa NOT DETECTED NOT DETECTED Final   Stenotrophomonas maltophilia NOT DETECTED NOT DETECTED Final   Candida albicans NOT DETECTED NOT DETECTED Final   Candida auris NOT DETECTED NOT DETECTED Final   Candida glabrata NOT DETECTED NOT DETECTED Final   Candida krusei NOT DETECTED NOT DETECTED Final   Candida parapsilosis NOT DETECTED NOT DETECTED Final   Candida tropicalis NOT DETECTED NOT DETECTED Final   Cryptococcus neoformans/gattii NOT DETECTED NOT DETECTED Final   Methicillin resistance mecA/C DETECTED (A) NOT DETECTED Final  Comment: CRITICAL RESULT CALLED TO, READ BACK BY AND VERIFIED WITH: A. DOBBS 2006 06/24/23 LRL Performed at [facility], [address] [place], [place] [address]   Blood Culture (routine x 2)     Status: None (Preliminary result)   Collection Time: 06/23/23  5:48 PM   Specimen: BLOOD  Result Value Ref Range Status   Specimen Description BLOOD BLOOD RIGHT FOREARM  Final   Special Requests   Final    BOTTLES DRAWN AEROBIC AND ANAEROBIC Blood Culture results may not be optimal due to an inadequate volume of blood received in culture bottles   Culture   Final    NO GROWTH 4 DAYS Performed at [facility], [address], [place], [place] [address]    Report Status PENDING  Incomplete  Resp panel by RT-PCR  (RSV, Flu A&B, Covid) Anterior Nasal Swab     Status: None   Collection Time: 06/23/23  8:45 PM   Specimen: Anterior Nasal Swab  Result Value Ref Range Status   SARS Coronavirus 2 by RT PCR NEGATIVE NEGATIVE Final    Comment: (NOTE) SARS-CoV-2 target nucleic acids are NOT DETECTED.  The SARS-CoV-2 RNA is generally detectable in upper respiratory specimens during the acute phase of infection. The lowest concentration of SARS-CoV-2 viral copies this assay can detect is 138 copies/mL. A negative result does not preclude SARS-Cov-2 infection and should not be used as the sole basis for treatment or other patient management decisions. A negative result may occur with  improper specimen collection/handling, submission of specimen other than nasopharyngeal swab, presence of viral mutation(s) within the areas targeted by this assay, and inadequate number of viral copies(<138 copies/mL). A negative result must be combined with clinical observations, patient history, and epidemiological information. The expected result is Negative.  Fact Sheet for Patients:  BloggerCourse.com  Fact Sheet for Healthcare Providers:  SeriousBroker.it  This test is no t yet approved or cleared by the [place] FDA and  has been authorized for detection and/or diagnosis of SARS-CoV-2 by FDA under an Emergency Use Authorization (EUA). This EUA will remain  in effect (meaning this test can be used) for the duration of the COVID-19 declaration under Section 564(b)(1) of the Act, 21 U.S.C.section 360bbb-3(b)(1), unless the authorization is terminated  or revoked sooner.       Influenza A by PCR NEGATIVE NEGATIVE Final   Influenza B by PCR NEGATIVE NEGATIVE Final    Comment: (NOTE) The Xpert Xpress SARS-CoV-2/FLU/RSV plus assay is intended as an aid in the diagnosis of influenza from Nasopharyngeal swab specimens and should not be used as a sole basis for  treatment. Nasal washings and aspirates are unacceptable for Xpert Xpress SARS-CoV-2/FLU/RSV testing.  Fact Sheet for Patients: BloggerCourse.com  Fact Sheet for Healthcare Providers: SeriousBroker.it  This test is not yet approved or cleared by the [place] FDA and has been authorized for detection and/or diagnosis of SARS-CoV-2 by FDA under an Emergency Use Authorization (EUA). This EUA will remain in effect (meaning this test can be used) for the duration of the COVID-19 declaration under Section 564(b)(1) of the Act, 21 U.S.C. section 360bbb-3(b)(1), unless the authorization is terminated or revoked.     Resp Syncytial Virus by PCR NEGATIVE NEGATIVE Final    Comment: (NOTE) Fact Sheet for Patients: BloggerCourse.com  Fact Sheet for Healthcare Providers: SeriousBroker.it  This test is not yet approved or cleared by the [place] FDA and has been authorized for detection and/or diagnosis of SARS-CoV-2 by  FDA under an Emergency Use Authorization (EUA). This EUA will remain in effect (meaning this test can be used) for the duration of the COVID-19 declaration under Section 564(b)(1) of the Act, 21 U.S.C. section 360bbb-3(b)(1), unless the authorization is terminated or revoked.  Performed at [facility], [address] [place], [place] [address]   Urine Culture     Status: Abnormal   Collection Time: 06/23/23  9:23 PM   Specimen: Urine, Random  Result Value Ref Range Status   Specimen Description   Final    URINE, RANDOM Performed at [facility], [address] [place], [place] [address]    Special Requests   Final    NONE Reflexed from [id number] Performed at [facility], [address] [place], [place] [address]    Culture (A)  Final    >=100,000 COLONIES/mL KLEBSIELLA PNEUMONIAE >=100,000 COLONIES/mL PROVIDENCIA STUARTII     Report Status 06/26/2023 FINAL  Final   Organism ID, Bacteria KLEBSIELLA PNEUMONIAE (A)  Final   Organism ID, Bacteria PROVIDENCIA STUARTII (A)  Final      Susceptibility   Klebsiella pneumoniae - MIC*    AMPICILLIN >=32 RESISTANT Resistant     CEFAZOLIN <=4 SENSITIVE Sensitive     CEFEPIME <=0.12 SENSITIVE Sensitive     CEFTRIAXONE <=0.25 SENSITIVE Sensitive     CIPROFLOXACIN <=0.25 SENSITIVE Sensitive     GENTAMICIN <=1 SENSITIVE Sensitive     IMIPENEM <=0.25 SENSITIVE Sensitive     NITROFURANTOIN 32 SENSITIVE Sensitive     TRIMETH/SULFA <=20 SENSITIVE Sensitive     AMPICILLIN/SULBACTAM 8 SENSITIVE Sensitive     PIP/TAZO 16 SENSITIVE Sensitive ug/mL    * >=100,000 COLONIES/mL KLEBSIELLA PNEUMONIAE   Providencia stuartii - MIC*    AMPICILLIN >=32 RESISTANT Resistant     CEFEPIME <=0.12 SENSITIVE Sensitive     CEFTRIAXONE 0.5 SENSITIVE Sensitive     CIPROFLOXACIN 2 RESISTANT Resistant     GENTAMICIN 4 SENSITIVE Sensitive     IMIPENEM 2 SENSITIVE Sensitive     NITROFURANTOIN 256 RESISTANT Resistant     TRIMETH/SULFA >=320 RESISTANT Resistant     AMPICILLIN/SULBACTAM >=32 RESISTANT Resistant     PIP/TAZO 8 SENSITIVE Sensitive ug/mL    * >=100,000 COLONIES/mL PROVIDENCIA STUARTII  Respiratory (~20 pathogens) panel by PCR     Status: None   Collection Time: 06/24/23  4:57 AM   Specimen: Nasopharyngeal Swab; Respiratory  Result Value Ref Range Status   Adenovirus NOT DETECTED NOT DETECTED Final   Coronavirus 229E NOT DETECTED NOT DETECTED Final    Comment: (NOTE) The Coronavirus on the Respiratory Panel, DOES NOT test for the novel  Coronavirus (2019 nCoV)    Coronavirus HKU1 NOT DETECTED NOT DETECTED Final   Coronavirus NL63 NOT DETECTED NOT DETECTED Final   Coronavirus OC43 NOT DETECTED NOT DETECTED Final   Metapneumovirus NOT DETECTED NOT DETECTED Final   Rhinovirus / Enterovirus NOT DETECTED NOT DETECTED Final   Influenza A NOT DETECTED NOT DETECTED Final   Influenza B NOT  DETECTED NOT DETECTED Final   Parainfluenza Virus 1 NOT DETECTED NOT DETECTED Final   Parainfluenza Virus 2 NOT DETECTED NOT DETECTED Final   Parainfluenza Virus 3 NOT DETECTED NOT DETECTED Final   Parainfluenza Virus 4 NOT DETECTED NOT DETECTED Final   Respiratory Syncytial Virus NOT DETECTED NOT DETECTED Final   Bordetella pertussis NOT DETECTED NOT DETECTED Final   Bordetella Parapertussis NOT DETECTED NOT DETECTED Final   Chlamydophila pneumoniae NOT DETECTED NOT DETECTED Final  Mycoplasma pneumoniae NOT DETECTED NOT DETECTED Final    Comment: Performed at [facility], [address] [place], [place] [address]    Procedures and diagnostic studies:  No results found.              LOS: 3 days   Arsenia Goracke  [facility]   Pager on [email or web address]. If 7PM-7AM, please contact night-coverage at [email or web address]     06/27/2023, 2:17 PM

## 2023-06-27 NOTE — Plan of Care (Signed)
 Received report. Family present, lying supine on air mattrace. Denies pain when asked. Orientated to person, reminded of place and time. Order for bedrest noted. Tolerating fluids, plus ensure. Both feet in prevelon boots, continue to turn q2hrs.  2310 Incontinent of urine, reports L foot very painful with movement. Peri care given, rolled from side to side with assistance, linen and gown changed. repositioned. Continues to moan after movement. 2320  Given analgesia for pain. Tolerated well. 0002 Resting when checked. Continue to reposition q2hrs.  Slept 5-6 hrs.

## 2023-06-28 DIAGNOSIS — T148XXA Other injury of unspecified body region, initial encounter: Secondary | ICD-10-CM | POA: Diagnosis not present

## 2023-06-28 DIAGNOSIS — L089 Local infection of the skin and subcutaneous tissue, unspecified: Secondary | ICD-10-CM | POA: Diagnosis not present

## 2023-06-28 LAB — MAGNESIUM: Magnesium: 1.7 mg/dL (ref 1.7–2.4)

## 2023-06-28 LAB — CULTURE, BLOOD (ROUTINE X 2): Culture: NO GROWTH

## 2023-06-28 LAB — PHOSPHORUS: Phosphorus: 2.6 mg/dL (ref 2.5–4.6)

## 2023-06-28 NOTE — Progress Notes (Signed)
 Progress Note    [name]  OZH:[id number] DOB: [birth year]  DOA: 06/23/2023 PCP: Smiley Houseman, NP      Brief Narrative:    Medical records reviewed and are as summarized below:  Laura Wilcox is a 89 y.o. female  with medical history significant of dementia, hypothyroidism, depression with anxiety, CKD-3B, skin cancer, UTI, myelofibrosis and polycythemia rubra vera, chronic left foot wound after hematoma (VAC), chronic cough, recent discharge from [facility] with pneumonia, who [facility] with altered mental status. According to her son, patient has been confused for about a week and has had worsened in the preceding 2 days prior to admission. Oxygen saturation was in the 70s in the ED.    Assessment/Plan:   Principal Problem:   Wound infection Active Problems:   Wound of left lower extremity   UTI (urinary tract infection)   Acute metabolic encephalopathy   Acute respiratory failure with hypoxia (HCC)   Chronic kidney disease, stage 3b (HCC)   Hypothyroidism   Anemia in chronic kidney disease   Polycythemia rubra vera (HCC)   Myelofibrosis (HCC)   Depression with anxiety   Palliative care encounter   Malnutrition of moderate degree    Body mass index is 10.94 kg/m.    Left foot wound infection Acute UTI Urine culture growing Klebsiella pneumoniae and Providencia stuartii Continue IV ceftriaxone through 06/29/2023 She was previously on IV vancomycin, cefepime and Flagyl. Analgesics as needed for pain Continue local wound care. CRP 3.6.  Lactic acid was normal. Staph epidermidis isolated in 1 blood culture bottle is a contaminant Outpatient follow-up with podiatrist and plastic surgeon.   Acute metabolic encephalopathy on underlying dementia, delirium: Continue supportive care and reorientation measures   Acute hypoxic respiratory failure Improved.  She is tolerating room air Patient also  has COPD (emphysema noted on CT chest)   CKD stage IIIb Creatinine stable   Hypoglycemia IV dextrose as needed.  Encourage adequate oral intake.   Hypophosphatemia Improved   Anemia of chronic disease H&H stable.  Monitor H&H and transfuse as needed.   Decubitus ulcers (present on admission) Bilateral heel deep tissue injury, stage II decubitus ulcers on the back Continue local wound care.   Comorbidities include polycythemia rubra vera, myelofibrosis (follows with Dr. Cathie Hoops, oncologist), protein calorie malnutrition, depression, anxiety,  hypothyroidism  Plan of care was discussed with the son at the bedside. Plan to complete IV antibiotics tomorrow.  Patient will likely be discharged on oral doxycycline and will follow-up with Dr. Ulice Bold, plastic surgeon, as an outpatient. TOC is working on placement to SNF.  Diet Order             Diet regular Fluid consistency: Thin  Diet effective now                            Consultants: Palliative care Podiatrist  Procedures: None    Medications:    vitamin C  500 mg Oral BID   aspirin EC  81 mg Oral Daily   calcitRIOL  0.25 mcg Oral Q M,W,F   cholecalciferol  1,000 Units Oral Daily   citalopram  10 mg Oral Daily   cyanocobalamin  1,000 mcg Oral Daily   feeding supplement  237 mL Oral BID BM   ferrous sulfate  325 mg Oral Daily   heparin  5,000 Units Subcutaneous Q8H   levothyroxine  25 mcg Oral Q0600  multivitamin with minerals  1 tablet Oral Daily   polyethylene glycol  17 g Oral Daily   senna-docusate  2 tablet Oral BID   Continuous Infusions:  cefTRIAXone (ROCEPHIN)  IV 1 g (06/28/23 1000)     Anti-infectives (From admission, onward)    Start     Dose/Rate Route Frequency Ordered Stop   06/26/23 1000  cefTRIAXone (ROCEPHIN) 1 g in sodium chloride 0.9 % 100 mL IVPB        1 g 200 mL/hr over 30 Minutes Intravenous Every 24 hours 06/26/23 0826     06/25/23 2030  vancomycin (VANCOCIN)  IVPB 1000 mg/200 mL premix  Status:  Discontinued        1,000 mg 200 mL/hr over 60 Minutes Intravenous Every 48 hours 06/24/23 0137 06/25/23 0919   06/25/23 2030  vancomycin (VANCOREADY) IVPB 500 mg/100 mL  Status:  Discontinued        500 mg 100 mL/hr over 60 Minutes Intravenous Every 24 hours 06/25/23 0919 06/27/23 1424   06/24/23 1800  ceFEPIme (MAXIPIME) 2 g in sodium chloride 0.9 % 100 mL IVPB  Status:  Discontinued        2 g 200 mL/hr over 30 Minutes Intravenous Every 24 hours 06/24/23 0134 06/26/23 0826   06/24/23 0700  metroNIDAZOLE (FLAGYL) IVPB 500 mg  Status:  Discontinued        500 mg 100 mL/hr over 60 Minutes Intravenous Every 12 hours 06/24/23 0005 06/26/23 0805   06/23/23 1745  ceFEPIme (MAXIPIME) 2 g in sodium chloride 0.9 % 100 mL IVPB        2 g 200 mL/hr over 30 Minutes Intravenous  Once 06/23/23 1744 06/23/23 1900   06/23/23 1745  metroNIDAZOLE (FLAGYL) IVPB 500 mg        500 mg 100 mL/hr over 60 Minutes Intravenous  Once 06/23/23 1744 06/23/23 2029   06/23/23 1745  vancomycin (VANCOCIN) IVPB 1000 mg/200 mL premix        1,000 mg 200 mL/hr over 60 Minutes Intravenous  Once 06/23/23 1744 06/23/23 2133              Family Communication/Anticipated D/C date and plan/Code Status   DVT prophylaxis: heparin injection 5,000 Units Start: 06/23/23 2315     Code Status: Limited: Do not attempt resuscitation (DNR) -DNR-LIMITED -Do Not Intubate/DNI   Family Communication: Plan discussed with the son at the bedside Disposition Plan: Plan to discharge to SNF   Status is: Inpatient Remains inpatient appropriate because: Wound infection       Subjective:   Interval events noted.  She is confused and cannot provide any history.  Her son was at the bedside.  Son reported that patient has been confused and hallucinating.  Patient denied any pain but son reported that patient was in a lot of pain when she was moved in bed.  Objective:    Vitals:    06/27/23 1444 06/27/23 2143 06/28/23 0411 06/28/23 0800  BP: 113/67 (!) 156/73 (!) 151/71 (!) 164/71  Pulse: 88 86 91 97  Resp: 18 20 16 16   Temp: 98.1 F (36.7 C) 98 F (36.7 C) 98.2 F (36.8 C) 98.6 F (37 C)  TempSrc:      SpO2: 92% 94% 97% 90%  Weight:      Height:       No data found.   Intake/Output Summary (Last 24 hours) at 06/28/2023 1222 Last data filed at 06/28/2023 1000 Gross per 24 hour  Intake 240  ml  Output --  Net 240 ml   Filed Weights   06/24/23 0121 06/25/23 1657 06/27/23 0344  Weight: 51.7 kg 52.4 kg 25.4 kg    Exam:  GEN: NAD SKIN: Warm and dry.  Multiple bruises on upper and lower extremities.  Dressing on left foot wound is clean, dry and intact EYES: No pallor or icterus ENT: MMM CV: RRR PULM: CTA B ABD: soft, ND, NT, +BS CNS: AAO x 1 (person) EXT: Tenderness of left foot and left leg        Data Reviewed:   I have personally reviewed following labs and imaging studies:  Labs: Labs show the following:   Basic Metabolic Panel: Recent Labs  Lab 06/23/23 2241 06/24/23 0141 06/26/23 0503 06/27/23 0517 06/28/23 0522  NA 136 135 136  --   --   K 4.6 4.4 3.9  --   --   CL 103 101 106  --   --   CO2 23 22 21*  --   --   GLUCOSE 88 73 85  --   --   BUN 25* 26* 21  --   --   CREATININE 0.90 0.87 0.90  --   --   CALCIUM 9.1 9.0 8.7*  --   --   MG  --   --  1.7 1.7 1.7  PHOS  --   --  2.4* 2.0* 2.6   GFR Estimated Creatinine Clearance: 15.3 mL/min (by C-G formula based on SCr of 0.9 mg/dL). Liver Function Tests: Recent Labs  Lab 06/23/23 2241  AST 23  ALT 10  ALKPHOS 65  BILITOT 0.9  PROT 5.9*  ALBUMIN 2.9*   No results for input(s): "LIPASE", "AMYLASE" in the last 168 hours. No results for input(s): "AMMONIA" in the last 168 hours. Coagulation profile Recent Labs  Lab 06/23/23 1830  INR 1.0    CBC: Recent Labs  Lab 06/23/23 1736 06/24/23 0141 06/26/23 0503  WBC 13.4* 13.6* 10.4  NEUTROABS 9.6*  --  7.2   HGB 9.6* 7.6* 7.6*  HCT 32.1* 25.1* 25.5*  MCV 87.5 88.1 86.4  PLT 838* 904* 764*   Cardiac Enzymes: No results for input(s): "CKTOTAL", "CKMB", "CKMBINDEX", "TROPONINI" in the last 168 hours. BNP (last 3 results) No results for input(s): "PROBNP" in the last 8760 hours. CBG: Recent Labs  Lab 06/24/23 1931 06/24/23 2224 06/25/23 0912  GLUCAP 66* 79 60*   D-Dimer: No results for input(s): "DDIMER" in the last 72 hours. Hgb A1c: No results for input(s): "HGBA1C" in the last 72 hours. Lipid Profile: No results for input(s): "CHOL", "HDL", "LDLCALC", "TRIG", "CHOLHDL", "LDLDIRECT" in the last 72 hours. Thyroid function studies: No results for input(s): "TSH", "T4TOTAL", "T3FREE", "THYROIDAB" in the last 72 hours.  Invalid input(s): "FREET3" Anemia work up: No results for input(s): "VITAMINB12", "FOLATE", "FERRITIN", "TIBC", "IRON", "RETICCTPCT" in the last 72 hours. Sepsis Labs: Recent Labs  Lab 06/23/23 1736 06/23/23 1743 06/23/23 2241 06/24/23 0141 06/26/23 0503  WBC 13.4*  --   --  13.6* 10.4  LATICACIDVEN  --  1.8 1.1  --   --     Microbiology Recent Results (from the past 240 hours)  Blood Culture (routine x 2)     Status: Abnormal   Collection Time: 06/23/23  5:36 PM   Specimen: BLOOD  Result Value Ref Range Status   Specimen Description   Final    BLOOD BLOOD LEFT FOREARM Performed at [facility], [address] [place],  [place] [address]    Special Requests   Final    BOTTLES DRAWN AEROBIC AND ANAEROBIC Blood Culture results may not be optimal due to an inadequate volume of blood received in culture bottles Performed at [facility], [address] [place], [place] [address]    Culture  Setup Time   Final    GRAM POSITIVE COCCI AEROBIC BOTTLE ONLY Organism ID to follow CRITICAL RESULT CALLED TO, READ BACK BY AND VERIFIED WITH: A. DOBBS 2006 06/24/23 LRL Performed at [facility], [address] [place], [place]  [address]    Culture (A)  Final    STAPHYLOCOCCUS EPIDERMIDIS THE SIGNIFICANCE OF ISOLATING THIS ORGANISM FROM A SINGLE SET OF BLOOD CULTURES WHEN MULTIPLE SETS ARE DRAWN IS UNCERTAIN. PLEASE NOTIFY THE MICROBIOLOGY DEPARTMENT WITHIN ONE WEEK IF SPECIATION AND SENSITIVITIES ARE REQUIRED. Performed at [facility], [address], [place], [place] [address]    Report Status 06/26/2023 FINAL  Final  Blood Culture ID Panel (Reflexed)     Status: Abnormal   Collection Time: 06/23/23  5:36 PM  Result Value Ref Range Status   Enterococcus faecalis NOT DETECTED NOT DETECTED Final   Enterococcus Faecium NOT DETECTED NOT DETECTED Final   Listeria monocytogenes NOT DETECTED NOT DETECTED Final   Staphylococcus species DETECTED (A) NOT DETECTED Final    Comment: CRITICAL RESULT CALLED TO, READ BACK BY AND VERIFIED WITH: A. DOBBS 2006 06/24/23 LRL    Staphylococcus aureus (BCID) NOT DETECTED NOT DETECTED Final   Staphylococcus epidermidis DETECTED (A) NOT DETECTED Final    Comment: Methicillin (oxacillin) resistant coagulase negative staphylococcus. Possible blood culture contaminant (unless isolated from more than one blood culture draw or clinical case suggests pathogenicity). No antibiotic treatment is indicated for blood  culture contaminants. CRITICAL RESULT CALLED TO, READ BACK BY AND VERIFIED WITH: A. DOBBS 2006 06/24/23 LRL    Staphylococcus lugdunensis NOT DETECTED NOT DETECTED Final   Streptococcus species NOT DETECTED NOT DETECTED Final   Streptococcus agalactiae NOT DETECTED NOT DETECTED Final   Streptococcus pneumoniae NOT DETECTED NOT DETECTED Final   Streptococcus pyogenes NOT DETECTED NOT DETECTED Final   A.calcoaceticus-baumannii NOT DETECTED NOT DETECTED Final   Bacteroides fragilis NOT DETECTED NOT DETECTED Final   Enterobacterales NOT DETECTED NOT DETECTED Final   Enterobacter cloacae complex NOT DETECTED NOT DETECTED Final   Escherichia coli NOT DETECTED NOT DETECTED Final    Klebsiella aerogenes NOT DETECTED NOT DETECTED Final   Klebsiella oxytoca NOT DETECTED NOT DETECTED Final   Klebsiella pneumoniae NOT DETECTED NOT DETECTED Final   Proteus species NOT DETECTED NOT DETECTED Final   Salmonella species NOT DETECTED NOT DETECTED Final   Serratia marcescens NOT DETECTED NOT DETECTED Final   Haemophilus influenzae NOT DETECTED NOT DETECTED Final   Neisseria meningitidis NOT DETECTED NOT DETECTED Final   Pseudomonas aeruginosa NOT DETECTED NOT DETECTED Final   Stenotrophomonas maltophilia NOT DETECTED NOT DETECTED Final   Candida albicans NOT DETECTED NOT DETECTED Final   Candida auris NOT DETECTED NOT DETECTED Final   Candida glabrata NOT DETECTED NOT DETECTED Final   Candida krusei NOT DETECTED NOT DETECTED Final   Candida parapsilosis NOT DETECTED NOT DETECTED Final   Candida tropicalis NOT DETECTED NOT DETECTED Final   Cryptococcus neoformans/gattii NOT DETECTED NOT DETECTED Final   Methicillin resistance mecA/C DETECTED (A) NOT DETECTED Final    Comment: CRITICAL RESULT CALLED TO, READ BACK BY AND VERIFIED WITH: A. DOBBS 2006 06/24/23 LRL Performed at [facility], [address] [place], [place] [address]   Blood Culture (routine x 2)     Status: None   Collection Time: 06/23/23  5:48 PM   Specimen: BLOOD  Result Value Ref Range Status   Specimen Description BLOOD BLOOD RIGHT FOREARM  Final   Special Requests   Final    BOTTLES DRAWN AEROBIC AND ANAEROBIC Blood Culture results may not be optimal due to an inadequate volume of blood received in culture bottles   Culture   Final    NO GROWTH 5 DAYS Performed at [facility], [address] [place], [place] [address]    Report Status 06/28/2023 FINAL  Final  Resp panel by RT-PCR (RSV, Flu A&B, Covid) Anterior Nasal Swab     Status: None   Collection Time: 06/23/23  8:45 PM   Specimen: Anterior Nasal Swab  Result Value Ref Range Status   SARS Coronavirus 2 by RT PCR NEGATIVE  NEGATIVE Final    Comment: (NOTE) SARS-CoV-2 target nucleic acids are NOT DETECTED.  The SARS-CoV-2 RNA is generally detectable in upper respiratory specimens during the acute phase of infection. The lowest concentration of SARS-CoV-2 viral copies this assay can detect is 138 copies/mL. A negative result does not preclude SARS-Cov-2 infection and should not be used as the sole basis for treatment or other patient management decisions. A negative result may occur with  improper specimen collection/handling, submission of specimen other than nasopharyngeal swab, presence of viral mutation(s) within the areas targeted by this assay, and inadequate number of viral copies(<138 copies/mL). A negative result must be combined with clinical observations, patient history, and epidemiological information. The expected result is Negative.  Fact Sheet for Patients:  BloggerCourse.com  Fact Sheet for Healthcare Providers:  SeriousBroker.it  This test is no t yet approved or cleared by the [place] FDA and  has been authorized for detection and/or diagnosis of SARS-CoV-2 by FDA under an Emergency Use Authorization (EUA). This EUA will remain  in effect (meaning this test can be used) for the duration of the COVID-19 declaration under Section 564(b)(1) of the Act, 21 U.S.C.section 360bbb-3(b)(1), unless the authorization is terminated  or revoked sooner.       Influenza A by PCR NEGATIVE NEGATIVE Final   Influenza B by PCR NEGATIVE NEGATIVE Final    Comment: (NOTE) The Xpert Xpress SARS-CoV-2/FLU/RSV plus assay is intended as an aid in the diagnosis of influenza from Nasopharyngeal swab specimens and should not be used as a sole basis for treatment. Nasal washings and aspirates are unacceptable for Xpert Xpress SARS-CoV-2/FLU/RSV testing.  Fact Sheet for Patients: BloggerCourse.com  Fact Sheet for Healthcare  Providers: SeriousBroker.it  This test is not yet approved or cleared by the [place] FDA and has been authorized for detection and/or diagnosis of SARS-CoV-2 by FDA under an Emergency Use Authorization (EUA). This EUA will remain in effect (meaning this test can be used) for the duration of the COVID-19 declaration under Section 564(b)(1) of the Act, 21 U.S.C. section 360bbb-3(b)(1), unless the authorization is terminated or revoked.     Resp Syncytial Virus by PCR NEGATIVE NEGATIVE Final    Comment: (NOTE) Fact Sheet for Patients: BloggerCourse.com  Fact Sheet for Healthcare Providers: SeriousBroker.it  This test is not yet approved or cleared by the [place] FDA and has been authorized for detection and/or diagnosis of SARS-CoV-2 by FDA under an Emergency Use Authorization (EUA). This EUA will remain in effect (meaning this test can be used) for the duration of  the COVID-19 declaration under Section 564(b)(1) of the Act, 21 U.S.C. section 360bbb-3(b)(1), unless the authorization is terminated or revoked.  Performed at [facility], [address] [place], [place] [address]   Urine Culture     Status: Abnormal   Collection Time: 06/23/23  9:23 PM   Specimen: Urine, Random  Result Value Ref Range Status   Specimen Description   Final    URINE, RANDOM Performed at [facility], [address] [place], [place] [address]    Special Requests   Final    NONE Reflexed from [id number] Performed at [facility], [address] [place], [place] [address]    Culture (A)  Final    >=100,000 COLONIES/mL KLEBSIELLA PNEUMONIAE >=100,000 COLONIES/mL PROVIDENCIA STUARTII    Report Status 06/26/2023 FINAL  Final   Organism ID, Bacteria KLEBSIELLA PNEUMONIAE (A)  Final   Organism ID, Bacteria PROVIDENCIA STUARTII (A)  Final      Susceptibility   Klebsiella pneumoniae -  MIC*    AMPICILLIN >=32 RESISTANT Resistant     CEFAZOLIN <=4 SENSITIVE Sensitive     CEFEPIME <=0.12 SENSITIVE Sensitive     CEFTRIAXONE <=0.25 SENSITIVE Sensitive     CIPROFLOXACIN <=0.25 SENSITIVE Sensitive     GENTAMICIN <=1 SENSITIVE Sensitive     IMIPENEM <=0.25 SENSITIVE Sensitive     NITROFURANTOIN 32 SENSITIVE Sensitive     TRIMETH/SULFA <=20 SENSITIVE Sensitive     AMPICILLIN/SULBACTAM 8 SENSITIVE Sensitive     PIP/TAZO 16 SENSITIVE Sensitive ug/mL    * >=100,000 COLONIES/mL KLEBSIELLA PNEUMONIAE   Providencia stuartii - MIC*    AMPICILLIN >=32 RESISTANT Resistant     CEFEPIME <=0.12 SENSITIVE Sensitive     CEFTRIAXONE 0.5 SENSITIVE Sensitive     CIPROFLOXACIN 2 RESISTANT Resistant     GENTAMICIN 4 SENSITIVE Sensitive     IMIPENEM 2 SENSITIVE Sensitive     NITROFURANTOIN 256 RESISTANT Resistant     TRIMETH/SULFA >=320 RESISTANT Resistant     AMPICILLIN/SULBACTAM >=32 RESISTANT Resistant     PIP/TAZO 8 SENSITIVE Sensitive ug/mL    * >=100,000 COLONIES/mL PROVIDENCIA STUARTII  Respiratory (~20 pathogens) panel by PCR     Status: None   Collection Time: 06/24/23  4:57 AM   Specimen: Nasopharyngeal Swab; Respiratory  Result Value Ref Range Status   Adenovirus NOT DETECTED NOT DETECTED Final   Coronavirus 229E NOT DETECTED NOT DETECTED Final    Comment: (NOTE) The Coronavirus on the Respiratory Panel, DOES NOT test for the novel  Coronavirus (2019 nCoV)    Coronavirus HKU1 NOT DETECTED NOT DETECTED Final   Coronavirus NL63 NOT DETECTED NOT DETECTED Final   Coronavirus OC43 NOT DETECTED NOT DETECTED Final   Metapneumovirus NOT DETECTED NOT DETECTED Final   Rhinovirus / Enterovirus NOT DETECTED NOT DETECTED Final   Influenza A NOT DETECTED NOT DETECTED Final   Influenza B NOT DETECTED NOT DETECTED Final   Parainfluenza Virus 1 NOT DETECTED NOT DETECTED Final   Parainfluenza Virus 2 NOT DETECTED NOT DETECTED Final   Parainfluenza Virus 3 NOT DETECTED NOT DETECTED Final    Parainfluenza Virus 4 NOT DETECTED NOT DETECTED Final   Respiratory Syncytial Virus NOT DETECTED NOT DETECTED Final   Bordetella pertussis NOT DETECTED NOT DETECTED Final   Bordetella Parapertussis NOT DETECTED NOT DETECTED Final   Chlamydophila pneumoniae NOT DETECTED NOT DETECTED Final   Mycoplasma pneumoniae NOT DETECTED NOT DETECTED Final    Comment: Performed at [facility], [address] [place],  [place] [address]    Procedures and diagnostic studies:  No results found.              LOS: 4 days   Ileana Chalupa  [facility]   Pager on [email or web address]. If 7PM-7AM, please contact night-coverage at [email or web address]     06/28/2023, 12:22 PM

## 2023-06-28 NOTE — Plan of Care (Signed)

## 2023-06-28 NOTE — Plan of Care (Signed)

## 2023-06-29 DIAGNOSIS — L089 Local infection of the skin and subcutaneous tissue, unspecified: Secondary | ICD-10-CM | POA: Diagnosis not present

## 2023-06-29 DIAGNOSIS — T148XXA Other injury of unspecified body region, initial encounter: Secondary | ICD-10-CM | POA: Diagnosis not present

## 2023-06-29 LAB — RENAL FUNCTION PANEL
Albumin: 2.7 g/dL — ABNORMAL LOW (ref 3.5–5.0)
Anion gap: 11 (ref 5–15)
BUN: 16 mg/dL (ref 8–23)
CO2: 24 mmol/L (ref 22–32)
Calcium: 9.1 mg/dL (ref 8.9–10.3)
Chloride: 100 mmol/L (ref 98–111)
Creatinine, Ser: 0.65 mg/dL (ref 0.44–1.00)
GFR, Estimated: >60 mL/min (ref >60)
Glucose, Bld: 84 mg/dL (ref 70–99)
Phosphorus: 2.1 mg/dL — ABNORMAL LOW (ref 2.5–4.6)
Potassium: 4.1 mmol/L (ref 3.5–5.1)
Sodium: 135 mmol/L (ref 135–145)

## 2023-06-29 LAB — CBC
HCT: 29.2 % — ABNORMAL LOW (ref 36.0–46.0)
Hemoglobin: 8.7 g/dL — ABNORMAL LOW (ref 12.0–15.0)
MCH: 25.7 pg — ABNORMAL LOW (ref 26.0–34.0)
MCHC: 29.8 g/dL — ABNORMAL LOW (ref 30.0–36.0)
MCV: 86.4 fL (ref 80.0–100.0)
Platelets: 788 10*3/uL — ABNORMAL HIGH (ref 150–400)
RBC: 3.38 MIL/uL — ABNORMAL LOW (ref 3.87–5.11)
RDW: 24.9 % — ABNORMAL HIGH (ref 11.5–15.5)
WBC: 16.9 10*3/uL — ABNORMAL HIGH (ref 4.0–10.5)
nRBC: 0.2 % (ref 0.0–0.2)

## 2023-06-29 MED ORDER — MUPIROCIN 2 % EX OINT
1.0000 | TOPICAL_OINTMENT | Freq: Two times a day (BID) | CUTANEOUS | Status: DC
  Administered 2023-06-29 – 2023-06-30 (×3): 1 via NASAL
  Filled 2023-06-29: qty 22

## 2023-06-29 MED ORDER — CHLORHEXIDINE GLUCONATE CLOTH 2 % EX PADS
6.0000 | MEDICATED_PAD | Freq: Every day | CUTANEOUS | Status: DC
  Administered 2023-06-30: 6 via TOPICAL

## 2023-06-29 NOTE — Progress Notes (Signed)
 Progress Note    Laura Wilcox  ZOX:[id number] DOB: 1927/03/02  DOA: 06/23/2023 PCP: Laura Houseman, NP      Brief Narrative:    Medical records reviewed and are as summarized below:  Laura Wilcox is a 89 y.o. female  with medical history significant of dementia, hypothyroidism, depression with anxiety, CKD-3B, skin cancer, UTI, myelofibrosis and polycythemia rubra vera, chronic left foot wound after hematoma (VAC), chronic cough, recent discharge from [facility] with pneumonia, who [facility] the [facility] with altered mental status. According to her son, patient has been confused for about a week and has had worsened in the preceding 2 days prior to admission. Oxygen saturation was in the 70s in the ED.    Assessment/Plan:   Principal Problem:   Wound infection Active Problems:   Wound of left lower extremity   UTI (urinary tract infection)   Acute metabolic encephalopathy   Acute respiratory failure with hypoxia (HCC)   Chronic kidney disease, stage 3b (HCC)   Hypothyroidism   Anemia in chronic kidney disease   Polycythemia rubra vera (HCC)   Myelofibrosis (HCC)   Depression with anxiety   Palliative care encounter   Malnutrition of moderate degree    Body mass index is 10.25 kg/m.    Left foot wound infection Acute UTI Urine culture grew Klebsiella pneumoniae and Providencia stuartii Completed 7 days of IV antibiotics today. She was previously on IV vancomycin, cefepime, Flagyl, and ceftriaxone Discussed oral antibiotics with her son.  Given her advanced age, and reluctant to start her on Bactrim.  His son also mentioned that he had been told in the past that Bactrim may not be a safe antibiotics for patient. Will start oral doxycycline for left foot wound infection.  He is agreeable to this plan. Analgesics as needed for pain Continue local wound care. CRP 3.6.  Lactic acid was normal. Staph epidermidis  isolated in 1 blood culture bottle is a contaminant Outpatient follow-up with podiatrist and plastic surgeon. Worsening leukocytosis.  Repeat CBC tomorrow.  Acute metabolic encephalopathy on underlying dementia, delirium: Continue supportive care and reorientation measures   Acute hypoxic respiratory failure Improved.  She is tolerating room air Patient also has COPD (emphysema noted on CT chest)   CKD stage IIIb Creatinine stable   Hypoglycemia IV dextrose as needed.  Encourage adequate oral intake.   Hypophosphatemia Improved   Anemia of chronic disease H&H stable.  Monitor H&H and transfuse as needed.   Decubitus ulcers (present on admission) Bilateral heel deep tissue injury, stage II decubitus ulcers on the back Continue local wound care.   Comorbidities include polycythemia rubra vera, myelofibrosis (follows with Dr. Cathie Hoops, oncologist), protein calorie malnutrition, depression, anxiety,  hypothyroidism  Plan of care was discussed with her son at the bedside.  Diet Order             Diet regular Fluid consistency: Thin  Diet effective now                            Consultants: Palliative care Podiatrist  Procedures: None    Medications:    vitamin C  500 mg Oral BID   aspirin EC  81 mg Oral Daily   calcitRIOL  0.25 mcg Oral Q M,W,F   cholecalciferol  1,000 Units Oral Daily   citalopram  10 mg Oral Daily   cyanocobalamin  1,000 mcg Oral Daily   feeding  supplement  237 mL Oral BID BM   ferrous sulfate  325 mg Oral Daily   heparin  5,000 Units Subcutaneous Q8H   levothyroxine  25 mcg Oral Q0600   multivitamin with minerals  1 tablet Oral Daily   polyethylene glycol  17 g Oral Daily   senna-docusate  2 tablet Oral BID   Continuous Infusions:     Anti-infectives (From admission, onward)    Start     Dose/Rate Route Frequency Ordered Stop   06/26/23 1000  cefTRIAXone (ROCEPHIN) 1 g in sodium chloride 0.9 % 100 mL IVPB        1  g 200 mL/hr over 30 Minutes Intravenous Every 24 hours 06/26/23 0826 06/29/23 1039   06/25/23 2030  vancomycin (VANCOCIN) IVPB 1000 mg/200 mL premix  Status:  Discontinued        1,000 mg 200 mL/hr over 60 Minutes Intravenous Every 48 hours 06/24/23 0137 06/25/23 0919   06/25/23 2030  vancomycin (VANCOREADY) IVPB 500 mg/100 mL  Status:  Discontinued        500 mg 100 mL/hr over 60 Minutes Intravenous Every 24 hours 06/25/23 0919 06/27/23 1424   06/24/23 1800  ceFEPIme (MAXIPIME) 2 g in sodium chloride 0.9 % 100 mL IVPB  Status:  Discontinued        2 g 200 mL/hr over 30 Minutes Intravenous Every 24 hours 06/24/23 0134 06/26/23 0826   06/24/23 0700  metroNIDAZOLE (FLAGYL) IVPB 500 mg  Status:  Discontinued        500 mg 100 mL/hr over 60 Minutes Intravenous Every 12 hours 06/24/23 0005 06/26/23 0805   06/23/23 1745  ceFEPIme (MAXIPIME) 2 g in sodium chloride 0.9 % 100 mL IVPB        2 g 200 mL/hr over 30 Minutes Intravenous  Once 06/23/23 1744 06/23/23 1900   06/23/23 1745  metroNIDAZOLE (FLAGYL) IVPB 500 mg        500 mg 100 mL/hr over 60 Minutes Intravenous  Once 06/23/23 1744 06/23/23 2029   06/23/23 1745  vancomycin (VANCOCIN) IVPB 1000 mg/200 mL premix        1,000 mg 200 mL/hr over 60 Minutes Intravenous  Once 06/23/23 1744 06/23/23 2133              Family Communication/Anticipated D/C date and plan/Code Status   DVT prophylaxis: heparin injection 5,000 Units Start: 06/23/23 2315     Code Status: Limited: Do not attempt resuscitation (DNR) -DNR-LIMITED -Do Not Intubate/DNI   Family Communication: Plan discussed with the son at the bedside Disposition Plan: Plan to discharge to SNF   Status is: Inpatient Remains inpatient appropriate because: Wound infection       Subjective:   Interval events noted.  No complaints.  Her son was at the bedside.  Earney Mallet, RN, was present at the bedside.  Son said patient is still confused.  Objective:    Vitals:    06/28/23 2156 06/29/23 0358 06/29/23 0500 06/29/23 0958  BP: (!) 179/86 (!) 155/66  (!) 150/73  Pulse: 100 92  93  Resp:  18  16  Temp: 98.7 F (37.1 C) 98 F (36.7 C)  98.8 F (37.1 C)  TempSrc:  Oral  Oral  SpO2: 95% 92%  96%  Weight:   23.8 kg   Height:       No data found.   Intake/Output Summary (Last 24 hours) at 06/29/2023 1358 Last data filed at 06/28/2023 1500 Gross per 24 hour  Intake 537  ml  Output --  Net 537 ml   Filed Weights   06/25/23 1657 06/27/23 0344 06/29/23 0500  Weight: 52.4 kg 25.4 kg 23.8 kg    Exam:  GEN: NAD SKIN: Warm and dry.  Multiple bruises on bilateral upper and lower extremities EYES: No pallor or icterus ENT: MMM CV: RRR PULM: CTA B ABD: soft, ND, NT, +BS CNS: AAO x 2 (person and place), non focal EXT: Left leg tenderness           Data Reviewed:   I have personally reviewed following labs and imaging studies:  Labs: Labs show the following:   Basic Metabolic Panel: Recent Labs  Lab 06/23/23 2241 06/24/23 0141 06/26/23 0503 06/27/23 0517 06/28/23 0522 06/29/23 0700  NA 136 135 136  --   --  135  K 4.6 4.4 3.9  --   --  4.1  CL 103 101 106  --   --  100  CO2 23 22 21*  --   --  24  GLUCOSE 88 73 85  --   --  84  BUN 25* 26* 21  --   --  16  CREATININE 0.90 0.87 0.90  --   --  0.65  CALCIUM 9.1 9.0 8.7*  --   --  9.1  MG  --   --  1.7 1.7 1.7  --   PHOS  --   --  2.4* 2.0* 2.6 2.1*   GFR Estimated Creatinine Clearance: 16.2 mL/min (by C-G formula based on SCr of 0.65 mg/dL). Liver Function Tests: Recent Labs  Lab 06/23/23 2241 06/29/23 0700  AST 23  --   ALT 10  --   ALKPHOS 65  --   BILITOT 0.9  --   PROT 5.9*  --   ALBUMIN 2.9* 2.7*   No results for input(s): "LIPASE", "AMYLASE" in the last 168 hours. No results for input(s): "AMMONIA" in the last 168 hours. Coagulation profile Recent Labs  Lab 06/23/23 1830  INR 1.0    CBC: Recent Labs  Lab 06/23/23 1736 06/24/23 0141 06/26/23 0503  06/29/23 0700  WBC 13.4* 13.6* 10.4 16.9*  NEUTROABS 9.6*  --  7.2  --   HGB 9.6* 7.6* 7.6* 8.7*  HCT 32.1* 25.1* 25.5* 29.2*  MCV 87.5 88.1 86.4 86.4  PLT 838* 904* 764* 788*   Cardiac Enzymes: No results for input(s): "CKTOTAL", "CKMB", "CKMBINDEX", "TROPONINI" in the last 168 hours. BNP (last 3 results) No results for input(s): "PROBNP" in the last 8760 hours. CBG: Recent Labs  Lab 06/24/23 1931 06/24/23 2224 06/25/23 0912  GLUCAP 66* 79 60*   D-Dimer: No results for input(s): "DDIMER" in the last 72 hours. Hgb A1c: No results for input(s): "HGBA1C" in the last 72 hours. Lipid Profile: No results for input(s): "CHOL", "HDL", "LDLCALC", "TRIG", "CHOLHDL", "LDLDIRECT" in the last 72 hours. Thyroid function studies: No results for input(s): "TSH", "T4TOTAL", "T3FREE", "THYROIDAB" in the last 72 hours.  Invalid input(s): "FREET3" Anemia work up: No results for input(s): "VITAMINB12", "FOLATE", "FERRITIN", "TIBC", "IRON", "RETICCTPCT" in the last 72 hours. Sepsis Labs: Recent Labs  Lab 06/23/23 1736 06/23/23 1743 06/23/23 2241 06/24/23 0141 06/26/23 0503 06/29/23 0700  WBC 13.4*  --   --  13.6* 10.4 16.9*  LATICACIDVEN  --  1.8 1.1  --   --   --     Microbiology Recent Results (from the past 240 hours)  Blood Culture (routine x 2)     Status: Abnormal  Collection Time: 06/23/23  5:36 PM   Specimen: BLOOD  Result Value Ref Range Status   Specimen Description   Final    BLOOD BLOOD LEFT FOREARM Performed at [facility], [address] [place], [place] [address]    Special Requests   Final    BOTTLES DRAWN AEROBIC AND ANAEROBIC Blood Culture results may not be optimal due to an inadequate volume of blood received in culture bottles Performed at [facility], [address], [place], [place] [address]    Culture  Setup Time   Final    GRAM POSITIVE COCCI AEROBIC BOTTLE ONLY Organism ID to follow CRITICAL RESULT CALLED TO, READ BACK BY  AND VERIFIED WITH: A. DOBBS 2006 06/24/23 LRL Performed at [facility], [address] [place], [place] [address]    Culture (A)  Final    STAPHYLOCOCCUS EPIDERMIDIS THE SIGNIFICANCE OF ISOLATING THIS ORGANISM FROM A SINGLE SET OF BLOOD CULTURES WHEN MULTIPLE SETS ARE DRAWN IS UNCERTAIN. PLEASE NOTIFY THE MICROBIOLOGY DEPARTMENT WITHIN ONE WEEK IF SPECIATION AND SENSITIVITIES ARE REQUIRED. Performed at [facility], [address] [place], [place] [address]    Report Status 06/26/2023 FINAL  Final  Blood Culture ID Panel (Reflexed)     Status: Abnormal   Collection Time: 06/23/23  5:36 PM  Result Value Ref Range Status   Enterococcus faecalis NOT DETECTED NOT DETECTED Final   Enterococcus Faecium NOT DETECTED NOT DETECTED Final   Listeria monocytogenes NOT DETECTED NOT DETECTED Final   Staphylococcus species DETECTED (A) NOT DETECTED Final    Comment: CRITICAL RESULT CALLED TO, READ BACK BY AND VERIFIED WITH: A. DOBBS 2006 06/24/23 LRL    Staphylococcus aureus (BCID) NOT DETECTED NOT DETECTED Final   Staphylococcus epidermidis DETECTED (A) NOT DETECTED Final    Comment: Methicillin (oxacillin) resistant coagulase negative staphylococcus. Possible blood culture contaminant (unless isolated from more than one blood culture draw or clinical case suggests pathogenicity). No antibiotic treatment is indicated for blood  culture contaminants. CRITICAL RESULT CALLED TO, READ BACK BY AND VERIFIED WITH: A. DOBBS 2006 06/24/23 LRL    Staphylococcus lugdunensis NOT DETECTED NOT DETECTED Final   Streptococcus species NOT DETECTED NOT DETECTED Final   Streptococcus agalactiae NOT DETECTED NOT DETECTED Final   Streptococcus pneumoniae NOT DETECTED NOT DETECTED Final   Streptococcus pyogenes NOT DETECTED NOT DETECTED Final   A.calcoaceticus-baumannii NOT DETECTED NOT DETECTED Final   Bacteroides fragilis NOT DETECTED NOT DETECTED Final   Enterobacterales NOT DETECTED NOT DETECTED  Final   Enterobacter cloacae complex NOT DETECTED NOT DETECTED Final   Escherichia coli NOT DETECTED NOT DETECTED Final   Klebsiella aerogenes NOT DETECTED NOT DETECTED Final   Klebsiella oxytoca NOT DETECTED NOT DETECTED Final   Klebsiella pneumoniae NOT DETECTED NOT DETECTED Final   Proteus species NOT DETECTED NOT DETECTED Final   Salmonella species NOT DETECTED NOT DETECTED Final   Serratia marcescens NOT DETECTED NOT DETECTED Final   Haemophilus influenzae NOT DETECTED NOT DETECTED Final   Neisseria meningitidis NOT DETECTED NOT DETECTED Final   Pseudomonas aeruginosa NOT DETECTED NOT DETECTED Final   Stenotrophomonas maltophilia NOT DETECTED NOT DETECTED Final   Candida albicans NOT DETECTED NOT DETECTED Final   Candida auris NOT DETECTED NOT DETECTED Final   Candida glabrata NOT DETECTED NOT DETECTED Final   Candida krusei NOT DETECTED NOT DETECTED Final   Candida parapsilosis NOT DETECTED NOT DETECTED Final   Candida tropicalis NOT DETECTED NOT DETECTED Final   Cryptococcus neoformans/gattii  NOT DETECTED NOT DETECTED Final   Methicillin resistance mecA/C DETECTED (A) NOT DETECTED Final    Comment: CRITICAL RESULT CALLED TO, READ BACK BY AND VERIFIED WITH: A. DOBBS 2006 06/24/23 LRL Performed at [facility], [address] [place], [place] [address]   Blood Culture (routine x 2)     Status: None   Collection Time: 06/23/23  5:48 PM   Specimen: BLOOD  Result Value Ref Range Status   Specimen Description BLOOD BLOOD RIGHT FOREARM  Final   Special Requests   Final    BOTTLES DRAWN AEROBIC AND ANAEROBIC Blood Culture results may not be optimal due to an inadequate volume of blood received in culture bottles   Culture   Final    NO GROWTH 5 DAYS Performed at [facility], [address] [place], [place] [address]    Report Status 06/28/2023 FINAL  Final  Resp panel by RT-PCR (RSV, Flu A&B, Covid) Anterior Nasal Swab     Status: None   Collection Time:  06/23/23  8:45 PM   Specimen: Anterior Nasal Swab  Result Value Ref Range Status   SARS Coronavirus 2 by RT PCR NEGATIVE NEGATIVE Final    Comment: (NOTE) SARS-CoV-2 target nucleic acids are NOT DETECTED.  The SARS-CoV-2 RNA is generally detectable in upper respiratory specimens during the acute phase of infection. The lowest concentration of SARS-CoV-2 viral copies this assay can detect is 138 copies/mL. A negative result does not preclude SARS-Cov-2 infection and should not be used as the sole basis for treatment or other patient management decisions. A negative result may occur with  improper specimen collection/handling, submission of specimen other than nasopharyngeal swab, presence of viral mutation(s) within the areas targeted by this assay, and inadequate number of viral copies(<138 copies/mL). A negative result must be combined with clinical observations, patient history, and epidemiological information. The expected result is Negative.  Fact Sheet for Patients:  BloggerCourse.com  Fact Sheet for Healthcare Providers:  SeriousBroker.it  This test is no t yet approved or cleared by the [place] FDA and  has been authorized for detection and/or diagnosis of SARS-CoV-2 by FDA under an Emergency Use Authorization (EUA). This EUA will remain  in effect (meaning this test can be used) for the duration of the COVID-19 declaration under Section 564(b)(1) of the Act, 21 U.S.C.section 360bbb-3(b)(1), unless the authorization is terminated  or revoked sooner.       Influenza A by PCR NEGATIVE NEGATIVE Final   Influenza B by PCR NEGATIVE NEGATIVE Final    Comment: (NOTE) The Xpert Xpress SARS-CoV-2/FLU/RSV plus assay is intended as an aid in the diagnosis of influenza from Nasopharyngeal swab specimens and should not be used as a sole basis for treatment. Nasal washings and aspirates are unacceptable for Xpert Xpress  SARS-CoV-2/FLU/RSV testing.  Fact Sheet for Patients: BloggerCourse.com  Fact Sheet for Healthcare Providers: SeriousBroker.it  This test is not yet approved or cleared by the [place] FDA and has been authorized for detection and/or diagnosis of SARS-CoV-2 by FDA under an Emergency Use Authorization (EUA). This EUA will remain in effect (meaning this test can be used) for the duration of the COVID-19 declaration under Section 564(b)(1) of the Act, 21 U.S.C. section 360bbb-3(b)(1), unless the authorization is terminated or revoked.     Resp Syncytial Virus by PCR NEGATIVE NEGATIVE Final    Comment: (NOTE) Fact Sheet for Patients: BloggerCourse.com  Fact Sheet for Healthcare Providers: SeriousBroker.it  This test is not yet approved or  cleared by the [place] and has been authorized for detection and/or diagnosis of SARS-CoV-2 by FDA under an Emergency Use Authorization (EUA). This EUA will remain in effect (meaning this test can be used) for the duration of the COVID-19 declaration under Section 564(b)(1) of the Act, 21 U.S.C. section 360bbb-3(b)(1), unless the authorization is terminated or revoked.  Performed at [facility], [address] [place], [place] [address]   Urine Culture     Status: Abnormal   Collection Time: 06/23/23  9:23 PM   Specimen: Urine, Random  Result Value Ref Range Status   Specimen Description   Final    URINE, RANDOM Performed at [facility], [address] [place], [place] [address]    Special Requests   Final    NONE Reflexed from [id number] Performed at [facility], [address] [place], [place] [address]    Culture (A)  Final    >=100,000 COLONIES/mL KLEBSIELLA PNEUMONIAE >=100,000 COLONIES/mL PROVIDENCIA STUARTII    Report Status 06/26/2023 FINAL  Final   Organism ID, Bacteria KLEBSIELLA  PNEUMONIAE (A)  Final   Organism ID, Bacteria PROVIDENCIA STUARTII (A)  Final      Susceptibility   Klebsiella pneumoniae - MIC*    AMPICILLIN >=32 RESISTANT Resistant     CEFAZOLIN <=4 SENSITIVE Sensitive     CEFEPIME <=0.12 SENSITIVE Sensitive     CEFTRIAXONE <=0.25 SENSITIVE Sensitive     CIPROFLOXACIN <=0.25 SENSITIVE Sensitive     GENTAMICIN <=1 SENSITIVE Sensitive     IMIPENEM <=0.25 SENSITIVE Sensitive     NITROFURANTOIN 32 SENSITIVE Sensitive     TRIMETH/SULFA <=20 SENSITIVE Sensitive     AMPICILLIN/SULBACTAM 8 SENSITIVE Sensitive     PIP/TAZO 16 SENSITIVE Sensitive ug/mL    * >=100,000 COLONIES/mL KLEBSIELLA PNEUMONIAE   Providencia stuartii - MIC*    AMPICILLIN >=32 RESISTANT Resistant     CEFEPIME <=0.12 SENSITIVE Sensitive     CEFTRIAXONE 0.5 SENSITIVE Sensitive     CIPROFLOXACIN 2 RESISTANT Resistant     GENTAMICIN 4 SENSITIVE Sensitive     IMIPENEM 2 SENSITIVE Sensitive     NITROFURANTOIN 256 RESISTANT Resistant     TRIMETH/SULFA >=320 RESISTANT Resistant     AMPICILLIN/SULBACTAM >=32 RESISTANT Resistant     PIP/TAZO 8 SENSITIVE Sensitive ug/mL    * >=100,000 COLONIES/mL PROVIDENCIA STUARTII  Respiratory (~20 pathogens) panel by PCR     Status: None   Collection Time: 06/24/23  4:57 AM   Specimen: Nasopharyngeal Swab; Respiratory  Result Value Ref Range Status   Adenovirus NOT DETECTED NOT DETECTED Final   Coronavirus 229E NOT DETECTED NOT DETECTED Final    Comment: (NOTE) The Coronavirus on the Respiratory Panel, DOES NOT test for the novel  Coronavirus (2019 nCoV)    Coronavirus HKU1 NOT DETECTED NOT DETECTED Final   Coronavirus NL63 NOT DETECTED NOT DETECTED Final   Coronavirus OC43 NOT DETECTED NOT DETECTED Final   Metapneumovirus NOT DETECTED NOT DETECTED Final   Rhinovirus / Enterovirus NOT DETECTED NOT DETECTED Final   Influenza A NOT DETECTED NOT DETECTED Final   Influenza B NOT DETECTED NOT DETECTED Final   Parainfluenza Virus 1 NOT DETECTED NOT  DETECTED Final   Parainfluenza Virus 2 NOT DETECTED NOT DETECTED Final   Parainfluenza Virus 3 NOT DETECTED NOT DETECTED Final   Parainfluenza Virus 4 NOT DETECTED NOT DETECTED Final   Respiratory Syncytial Virus NOT DETECTED NOT DETECTED Final   Bordetella pertussis NOT DETECTED NOT DETECTED Final  Bordetella Parapertussis NOT DETECTED NOT DETECTED Final   Chlamydophila pneumoniae NOT DETECTED NOT DETECTED Final   Mycoplasma pneumoniae NOT DETECTED NOT DETECTED Final    Comment: Performed at [facility], [address] [place], [place] [address]    Procedures and diagnostic studies:  No results found.              LOS: 5 days   Ezmae Speers  [facility]   Pager on [email or web address]. If 7PM-7AM, please contact night-coverage at [email or web address]     06/29/2023, 1:58 PM

## 2023-06-29 NOTE — Plan of Care (Signed)

## 2023-06-30 DIAGNOSIS — T148XXA Other injury of unspecified body region, initial encounter: Secondary | ICD-10-CM | POA: Diagnosis not present

## 2023-06-30 DIAGNOSIS — L089 Local infection of the skin and subcutaneous tissue, unspecified: Secondary | ICD-10-CM | POA: Diagnosis not present

## 2023-06-30 LAB — CBC WITH DIFFERENTIAL/PLATELET
Abs Immature Granulocytes: 1.89 10*3/uL — ABNORMAL HIGH (ref 0.00–0.07)
Basophils Absolute: 0.1 10*3/uL (ref 0.0–0.1)
Basophils Relative: 1 %
Eosinophils Absolute: 1 10*3/uL — ABNORMAL HIGH (ref 0.0–0.5)
Eosinophils Relative: 6 %
HCT: 21.5 % — ABNORMAL LOW (ref 36.0–46.0)
Hemoglobin: 6.6 g/dL — ABNORMAL LOW (ref 12.0–15.0)
Immature Granulocytes: 12 %
Lymphocytes Relative: 10 %
Lymphs Abs: 1.6 10*3/uL (ref 0.7–4.0)
MCH: 26.5 pg (ref 26.0–34.0)
MCHC: 30.7 g/dL (ref 30.0–36.0)
MCV: 86.3 fL (ref 80.0–100.0)
Monocytes Absolute: 1.4 10*3/uL — ABNORMAL HIGH (ref 0.1–1.0)
Monocytes Relative: 9 %
Neutro Abs: 10.4 10*3/uL — ABNORMAL HIGH (ref 1.7–7.7)
Neutrophils Relative %: 62 %
Platelets: 646 10*3/uL — ABNORMAL HIGH (ref 150–400)
RBC: 2.49 MIL/uL — ABNORMAL LOW (ref 3.87–5.11)
RDW: 25.1 % — ABNORMAL HIGH (ref 11.5–15.5)
Smear Review: NORMAL
WBC: 16.4 10*3/uL — ABNORMAL HIGH (ref 4.0–10.5)
nRBC: 0.6 % — ABNORMAL HIGH (ref 0.0–0.2)

## 2023-06-30 LAB — C-REACTIVE PROTEIN: CRP: 3.7 mg/dL — ABNORMAL HIGH (ref <1.0)

## 2023-06-30 LAB — HEMOGLOBIN AND HEMATOCRIT, BLOOD
HCT: 29 % — ABNORMAL LOW (ref 36.0–46.0)
Hemoglobin: 8.9 g/dL — ABNORMAL LOW (ref 12.0–15.0)

## 2023-06-30 LAB — PREPARE RBC (CROSSMATCH)

## 2023-06-30 MED ORDER — SODIUM CHLORIDE 0.9% IV SOLUTION: Freq: Once | INTRAVENOUS | Status: AC

## 2023-06-30 MED ORDER — TRAMADOL HCL 50 MG PO TABS
50.0000 mg | ORAL_TABLET | Freq: Three times a day (TID) | ORAL | Status: DC | PRN
  Administered 2023-07-01: 50 mg via ORAL
  Filled 2023-06-30: qty 1

## 2023-06-30 MED ORDER — DOXYCYCLINE HYCLATE 100 MG PO TABS
100.0000 mg | ORAL_TABLET | Freq: Two times a day (BID) | ORAL | Status: DC
  Administered 2023-06-30 – 2023-07-01 (×3): 100 mg via ORAL
  Filled 2023-06-30 (×3): qty 1

## 2023-06-30 MED ORDER — HYDROMORPHONE HCL 1 MG/ML IJ SOLN
0.5000 mg | INTRAMUSCULAR | Status: DC | PRN
  Administered 2023-06-30 – 2023-07-01 (×2): 0.5 mg via INTRAVENOUS
  Filled 2023-06-30 (×2): qty 0.5

## 2023-06-30 MED ORDER — HEPARIN SODIUM (PORCINE) 5000 UNIT/ML IJ SOLN
5000.0000 [IU] | Freq: Three times a day (TID) | INTRAMUSCULAR | Status: DC
  Administered 2023-07-01: 5000 [IU] via SUBCUTANEOUS
  Filled 2023-06-30: qty 1

## 2023-06-30 NOTE — Progress Notes (Signed)
 OT Cancellation Note  Patient Details Name: Laura Wilcox MRN: [id number] DOB: 1926/10/12   Cancelled Treatment:    Reason Eval/Treat Not Completed: Medical issues which prohibited therapy. Pt's HgB is 6.6 this morning, will hold off therapy until stable. Will cont to follow POC as appropriate.  Lennyn Gange E Aiyana Stegmann 06/30/2023, 8:30 AM

## 2023-06-30 NOTE — Plan of Care (Signed)

## 2023-06-30 NOTE — Progress Notes (Addendum)
 Progress Note    Laura Wilcox  ZOX:[id number] DOB: 1926/09/29  DOA: 06/23/2023 PCP: Smiley Houseman, NP      Brief Narrative:    Medical records reviewed and are as summarized below:  Laura Wilcox is a 89 y.o. female  with medical history significant of dementia, hypothyroidism, depression with anxiety, CKD-3B, skin cancer, UTI, myelofibrosis and polycythemia rubra vera, chronic left foot wound after hematoma (VAC), chronic cough, recent discharge from [facility] with pneumonia, who [facility] with altered mental status. According to her son, patient has been confused for about a week and has had worsened in the preceding 2 days prior to admission. Oxygen saturation was in the 70s in the ED.    Assessment/Plan:   Principal Problem:   Wound infection Active Problems:   Wound of left lower extremity   UTI (urinary tract infection)   Acute metabolic encephalopathy   Acute respiratory failure with hypoxia (HCC)   Chronic kidney disease, stage 3b (HCC)   Hypothyroidism   Anemia in chronic kidney disease   Polycythemia rubra vera (HCC)   Myelofibrosis (HCC)   Depression with anxiety   Palliative care encounter   Malnutrition of moderate degree    Body mass index is 9.9 kg/m.    Left foot wound infection Left leg hematoma Acute UTI Urine culture grew Klebsiella pneumoniae and Providencia stuartii Completed 7 days of IV antibiotics on 06/29/2023 She was previously on IV vancomycin, cefepime, Flagyl, and ceftriaxone Start oral doxycycline and continue for 7 days. Plan discussed with daughter at the bedside.  It was explained that patient is at risk for C. difficile infection with prolonged antibiotics. Analgesics as needed for pain Continue local wound care.  Apply cold compresses to left leg hematoma CRP 3.6.  Lactic acid was normal. Staph epidermidis isolated in 1 blood culture bottle is a  contaminant Outpatient follow-up with podiatrist and plastic surgeon. WBC still elevated but chart review showed that patient has not intermittent elevation in WBC.   Acute metabolic encephalopathy on underlying dementia, delirium: Continue supportive care and reorientation measures   Acute hypoxic respiratory failure Improved.  She is tolerating room air Patient also has COPD (emphysema noted on CT chest)   CKD stage IIIb Creatinine stable   Hypoglycemia IV dextrose as needed.  Encourage adequate oral intake.   Hypophosphatemia Improved   Acute on chronic anemia, anemia of chronic disease Hemoglobin dropped to 6.6.  Transfuse 1 unit of PRBCs and monitor H&H.  Discussed risks, benefits of blood transfusion with [name], daughter, at the bedside.  She said patient has had blood transfusion in the past without any problems and last blood transfusion was sometime in January 2025. Hold subcu heparin today   Decubitus ulcers (present on admission) Bilateral heel deep tissue injury, stage II decubitus ulcers on the back Continue local wound care.   Comorbidities include polycythemia rubra vera, myelofibrosis (follows with Dr. Cathie Hoops, oncologist), protein calorie malnutrition, depression, anxiety,  hypothyroidism  Plan of care was discussed with [name], daughter, at the bedside    Diet Order             Diet regular Fluid consistency: Thin  Diet effective now                            Consultants: Palliative care Podiatrist  Procedures: None    Medications:    sodium chloride   Intravenous Once  vitamin C  500 mg Oral BID   aspirin EC  81 mg Oral Daily   calcitRIOL  0.25 mcg Oral Q M,W,F   Chlorhexidine Gluconate Cloth  6 each Topical Daily   cholecalciferol  1,000 Units Oral Daily   citalopram  10 mg Oral Daily   cyanocobalamin  1,000 mcg Oral Daily   doxycycline  100 mg Oral Q12H   feeding supplement  237 mL Oral BID BM   ferrous sulfate   325 mg Oral Daily   heparin  5,000 Units Subcutaneous Q8H   levothyroxine  25 mcg Oral Q0600   multivitamin with minerals  1 tablet Oral Daily   mupirocin ointment  1 Application Nasal BID   polyethylene glycol  17 g Oral Daily   senna-docusate  2 tablet Oral BID   Continuous Infusions:     Anti-infectives (From admission, onward)    Start     Dose/Rate Route Frequency Ordered Stop   06/30/23 1000  doxycycline (VIBRA-TABS) tablet 100 mg        100 mg Oral Every 12 hours 06/30/23 0806 07/07/23 0959   06/26/23 1000  cefTRIAXone (ROCEPHIN) 1 g in sodium chloride 0.9 % 100 mL IVPB        1 g 200 mL/hr over 30 Minutes Intravenous Every 24 hours 06/26/23 0826 06/29/23 1039   06/25/23 2030  vancomycin (VANCOCIN) IVPB 1000 mg/200 mL premix  Status:  Discontinued        1,000 mg 200 mL/hr over 60 Minutes Intravenous Every 48 hours 06/24/23 0137 06/25/23 0919   06/25/23 2030  vancomycin (VANCOREADY) IVPB 500 mg/100 mL  Status:  Discontinued        500 mg 100 mL/hr over 60 Minutes Intravenous Every 24 hours 06/25/23 0919 06/27/23 1424   06/24/23 1800  ceFEPIme (MAXIPIME) 2 g in sodium chloride 0.9 % 100 mL IVPB  Status:  Discontinued        2 g 200 mL/hr over 30 Minutes Intravenous Every 24 hours 06/24/23 0134 06/26/23 0826   06/24/23 0700  metroNIDAZOLE (FLAGYL) IVPB 500 mg  Status:  Discontinued        500 mg 100 mL/hr over 60 Minutes Intravenous Every 12 hours 06/24/23 0005 06/26/23 0805   06/23/23 1745  ceFEPIme (MAXIPIME) 2 g in sodium chloride 0.9 % 100 mL IVPB        2 g 200 mL/hr over 30 Minutes Intravenous  Once 06/23/23 1744 06/23/23 1900   06/23/23 1745  metroNIDAZOLE (FLAGYL) IVPB 500 mg        500 mg 100 mL/hr over 60 Minutes Intravenous  Once 06/23/23 1744 06/23/23 2029   06/23/23 1745  vancomycin (VANCOCIN) IVPB 1000 mg/200 mL premix        1,000 mg 200 mL/hr over 60 Minutes Intravenous  Once 06/23/23 1744 06/23/23 2133              Family  Communication/Anticipated D/C date and plan/Code Status   DVT prophylaxis: heparin injection 5,000 Units Start: 06/23/23 2315     Code Status: Limited: Do not attempt resuscitation (DNR) -DNR-LIMITED -Do Not Intubate/DNI   Family Communication: Plan discussed with Elease Hashimoto, daughter, at the bedside Disposition Plan: Plan to discharge to SNF   Status is: Inpatient Remains inpatient appropriate because: Severe anemia       Subjective:   Interval events noted.  She has intermittent confusion.  She is unable to provide any history.  Objective:    Vitals:   06/29/23 1947 06/30/23  0200 06/30/23 0338 06/30/23 0858  BP: 136/75  122/64 124/78  Pulse: 70  91 88  Resp: 20  18 16   Temp: 98.7 F (37.1 C)  98.3 F (36.8 C) 98.3 F (36.8 C)  TempSrc:    Oral  SpO2: 92%  93% 95%  Weight:  23 kg    Height:       No data found.   Intake/Output Summary (Last 24 hours) at 06/30/2023 1124 Last data filed at 06/30/2023 0900 Gross per 24 hour  Intake 0 ml  Output --  Net 0 ml   Filed Weights   06/27/23 0344 06/29/23 0500 06/30/23 0200  Weight: 25.4 kg 23.8 kg 23 kg    Exam:  GEN: NAD SKIN: Warm and dry.  Multiple bruises on bilateral upper and lower extremities.  New hematoma on posterior left leg. EYES: Pale, anicteric ENT: MMM CV: RRR PULM: CTA B ABD: soft, ND, NT, +BS CNS: AAO x 1 (person), non focal EXT: Left leg tenderness        Data Reviewed:   I have personally reviewed following labs and imaging studies:  Labs: Labs show the following:   Basic Metabolic Panel: Recent Labs  Lab 06/23/23 2241 06/24/23 0141 06/26/23 0503 06/27/23 0517 06/28/23 0522 06/29/23 0700  NA 136 135 136  --   --  135  K 4.6 4.4 3.9  --   --  4.1  CL 103 101 106  --   --  100  CO2 23 22 21*  --   --  24  GLUCOSE 88 73 85  --   --  84  BUN 25* 26* 21  --   --  16  CREATININE 0.90 0.87 0.90  --   --  0.65  CALCIUM 9.1 9.0 8.7*  --   --  9.1  MG  --   --  1.7 1.7 1.7   --   PHOS  --   --  2.4* 2.0* 2.6 2.1*   GFR Estimated Creatinine Clearance: 15.6 mL/min (by C-G formula based on SCr of 0.65 mg/dL). Liver Function Tests: Recent Labs  Lab 06/23/23 2241 06/29/23 0700  AST 23  --   ALT 10  --   ALKPHOS 65  --   BILITOT 0.9  --   PROT 5.9*  --   ALBUMIN 2.9* 2.7*   No results for input(s): "LIPASE", "AMYLASE" in the last 168 hours. No results for input(s): "AMMONIA" in the last 168 hours. Coagulation profile Recent Labs  Lab 06/23/23 1830  INR 1.0    CBC: Recent Labs  Lab 06/23/23 1736 06/24/23 0141 06/26/23 0503 06/29/23 0700 06/30/23 0535  WBC 13.4* 13.6* 10.4 16.9* 16.4*  NEUTROABS 9.6*  --  7.2  --  10.4*  HGB 9.6* 7.6* 7.6* 8.7* 6.6*  HCT 32.1* 25.1* 25.5* 29.2* 21.5*  MCV 87.5 88.1 86.4 86.4 86.3  PLT 838* 904* 764* 788* 646*   Cardiac Enzymes: No results for input(s): "CKTOTAL", "CKMB", "CKMBINDEX", "TROPONINI" in the last 168 hours. BNP (last 3 results) No results for input(s): "PROBNP" in the last 8760 hours. CBG: Recent Labs  Lab 06/24/23 1931 06/24/23 2224 06/25/23 0912  GLUCAP 66* 79 60*   D-Dimer: No results for input(s): "DDIMER" in the last 72 hours. Hgb A1c: No results for input(s): "HGBA1C" in the last 72 hours. Lipid Profile: No results for input(s): "CHOL", "HDL", "LDLCALC", "TRIG", "CHOLHDL", "LDLDIRECT" in the last 72 hours. Thyroid function studies: No results for input(s): "TSH", "T4TOTAL", "  T3FREE", "THYROIDAB" in the last 72 hours.  Invalid input(s): "FREET3" Anemia work up: No results for input(s): "VITAMINB12", "FOLATE", "FERRITIN", "TIBC", "IRON", "RETICCTPCT" in the last 72 hours. Sepsis Labs: Recent Labs  Lab 06/23/23 1743 06/23/23 2241 06/24/23 0141 06/26/23 0503 06/29/23 0700 06/30/23 0535  WBC  --   --  13.6* 10.4 16.9* 16.4*  LATICACIDVEN 1.8 1.1  --   --   --   --     Microbiology Recent Results (from the past 240 hours)  Blood Culture (routine x 2)     Status: Abnormal    Collection Time: 06/23/23  5:36 PM   Specimen: BLOOD  Result Value Ref Range Status   Specimen Description   Final    BLOOD BLOOD LEFT FOREARM Performed at [facility], [address] [place], [place] [address]    Special Requests   Final    BOTTLES DRAWN AEROBIC AND ANAEROBIC Blood Culture results may not be optimal due to an inadequate volume of blood received in culture bottles Performed at [facility], [address], [place], [place] [address]    Culture  Setup Time   Final    GRAM POSITIVE COCCI AEROBIC BOTTLE ONLY Organism ID to follow CRITICAL RESULT CALLED TO, READ BACK BY AND VERIFIED WITH: A. DOBBS 2006 06/24/23 LRL Performed at [facility], [address] [place], [place] [address]    Culture (A)  Final    STAPHYLOCOCCUS EPIDERMIDIS THE SIGNIFICANCE OF ISOLATING THIS ORGANISM FROM A SINGLE SET OF BLOOD CULTURES WHEN MULTIPLE SETS ARE DRAWN IS UNCERTAIN. PLEASE NOTIFY THE MICROBIOLOGY DEPARTMENT WITHIN ONE WEEK IF SPECIATION AND SENSITIVITIES ARE REQUIRED. Performed at [facility], [address] [place], [place] [address]    Report Status 06/26/2023 FINAL  Final  Blood Culture ID Panel (Reflexed)     Status: Abnormal   Collection Time: 06/23/23  5:36 PM  Result Value Ref Range Status   Enterococcus faecalis NOT DETECTED NOT DETECTED Final   Enterococcus Faecium NOT DETECTED NOT DETECTED Final   Listeria monocytogenes NOT DETECTED NOT DETECTED Final   Staphylococcus species DETECTED (A) NOT DETECTED Final    Comment: CRITICAL RESULT CALLED TO, READ BACK BY AND VERIFIED WITH: A. DOBBS 2006 06/24/23 LRL    Staphylococcus aureus (BCID) NOT DETECTED NOT DETECTED Final   Staphylococcus epidermidis DETECTED (A) NOT DETECTED Final    Comment: Methicillin (oxacillin) resistant coagulase negative staphylococcus. Possible blood culture contaminant (unless isolated from more than one blood culture draw or clinical case suggests  pathogenicity). No antibiotic treatment is indicated for blood  culture contaminants. CRITICAL RESULT CALLED TO, READ BACK BY AND VERIFIED WITH: A. DOBBS 2006 06/24/23 LRL    Staphylococcus lugdunensis NOT DETECTED NOT DETECTED Final   Streptococcus species NOT DETECTED NOT DETECTED Final   Streptococcus agalactiae NOT DETECTED NOT DETECTED Final   Streptococcus pneumoniae NOT DETECTED NOT DETECTED Final   Streptococcus pyogenes NOT DETECTED NOT DETECTED Final   A.calcoaceticus-baumannii NOT DETECTED NOT DETECTED Final   Bacteroides fragilis NOT DETECTED NOT DETECTED Final   Enterobacterales NOT DETECTED NOT DETECTED Final   Enterobacter cloacae complex NOT DETECTED NOT DETECTED Final   Escherichia coli NOT DETECTED NOT DETECTED Final   Klebsiella aerogenes NOT DETECTED NOT DETECTED Final   Klebsiella oxytoca NOT DETECTED NOT DETECTED Final   Klebsiella pneumoniae NOT DETECTED NOT DETECTED Final   Proteus species NOT DETECTED NOT DETECTED Final   Salmonella species NOT DETECTED NOT DETECTED Final   Serratia  marcescens NOT DETECTED NOT DETECTED Final   Haemophilus influenzae NOT DETECTED NOT DETECTED Final   Neisseria meningitidis NOT DETECTED NOT DETECTED Final   Pseudomonas aeruginosa NOT DETECTED NOT DETECTED Final   Stenotrophomonas maltophilia NOT DETECTED NOT DETECTED Final   Candida albicans NOT DETECTED NOT DETECTED Final   Candida auris NOT DETECTED NOT DETECTED Final   Candida glabrata NOT DETECTED NOT DETECTED Final   Candida krusei NOT DETECTED NOT DETECTED Final   Candida parapsilosis NOT DETECTED NOT DETECTED Final   Candida tropicalis NOT DETECTED NOT DETECTED Final   Cryptococcus neoformans/gattii NOT DETECTED NOT DETECTED Final   Methicillin resistance mecA/C DETECTED (A) NOT DETECTED Final    Comment: CRITICAL RESULT CALLED TO, READ BACK BY AND VERIFIED WITH: A. DOBBS 2006 06/24/23 LRL Performed at [facility], [address] [place], [place]  [address]   Blood Culture (routine x 2)     Status: None   Collection Time: 06/23/23  5:48 PM   Specimen: BLOOD  Result Value Ref Range Status   Specimen Description BLOOD BLOOD RIGHT FOREARM  Final   Special Requests   Final    BOTTLES DRAWN AEROBIC AND ANAEROBIC Blood Culture results may not be optimal due to an inadequate volume of blood received in culture bottles   Culture   Final    NO GROWTH 5 DAYS Performed at [facility], [address] [place], [place] [address]    Report Status 06/28/2023 FINAL  Final  Resp panel by RT-PCR (RSV, Flu A&B, Covid) Anterior Nasal Swab     Status: None   Collection Time: 06/23/23  8:45 PM   Specimen: Anterior Nasal Swab  Result Value Ref Range Status   SARS Coronavirus 2 by RT PCR NEGATIVE NEGATIVE Final    Comment: (NOTE) SARS-CoV-2 target nucleic acids are NOT DETECTED.  The SARS-CoV-2 RNA is generally detectable in upper respiratory specimens during the acute phase of infection. The lowest concentration of SARS-CoV-2 viral copies this assay can detect is 138 copies/mL. A negative result does not preclude SARS-Cov-2 infection and should not be used as the sole basis for treatment or other patient management decisions. A negative result may occur with  improper specimen collection/handling, submission of specimen other than nasopharyngeal swab, presence of viral mutation(s) within the areas targeted by this assay, and inadequate number of viral copies(<138 copies/mL). A negative result must be combined with clinical observations, patient history, and epidemiological information. The expected result is Negative.  Fact Sheet for Patients:  BloggerCourse.com  Fact Sheet for Healthcare Providers:  SeriousBroker.it  This test is no t yet approved or cleared by the [place] FDA and  has been authorized for detection and/or diagnosis of SARS-CoV-2 by FDA under an Emergency Use  Authorization (EUA). This EUA will remain  in effect (meaning this test can be used) for the duration of the COVID-19 declaration under Section 564(b)(1) of the Act, 21 U.S.C.section 360bbb-3(b)(1), unless the authorization is terminated  or revoked sooner.       Influenza A by PCR NEGATIVE NEGATIVE Final   Influenza B by PCR NEGATIVE NEGATIVE Final    Comment: (NOTE) The Xpert Xpress SARS-CoV-2/FLU/RSV plus assay is intended as an aid in the diagnosis of influenza from Nasopharyngeal swab specimens and should not be used as a sole basis for treatment. Nasal washings and aspirates are unacceptable for Xpert Xpress SARS-CoV-2/FLU/RSV testing.  Fact Sheet for Patients: BloggerCourse.com  Fact Sheet for Healthcare Providers: SeriousBroker.it  This test is not  yet approved or cleared by the [place] and has been authorized for detection and/or diagnosis of SARS-CoV-2 by FDA under an Emergency Use Authorization (EUA). This EUA will remain in effect (meaning this test can be used) for the duration of the COVID-19 declaration under Section 564(b)(1) of the Act, 21 U.S.C. section 360bbb-3(b)(1), unless the authorization is terminated or revoked.     Resp Syncytial Virus by PCR NEGATIVE NEGATIVE Final    Comment: (NOTE) Fact Sheet for Patients: BloggerCourse.com  Fact Sheet for Healthcare Providers: SeriousBroker.it  This test is not yet approved or cleared by the [place] FDA and has been authorized for detection and/or diagnosis of SARS-CoV-2 by FDA under an Emergency Use Authorization (EUA). This EUA will remain in effect (meaning this test can be used) for the duration of the COVID-19 declaration under Section 564(b)(1) of the Act, 21 U.S.C. section 360bbb-3(b)(1), unless the authorization is terminated or revoked.  Performed at [facility], [address] [place], [place] [address]   Urine Culture     Status: Abnormal   Collection Time: 06/23/23  9:23 PM   Specimen: Urine, Random  Result Value Ref Range Status   Specimen Description   Final    URINE, RANDOM Performed at [facility], [address] [place], [place] [address]    Special Requests   Final    NONE Reflexed from [phone] Performed at [facility], [address] [place], [place] [address]    Culture (A)  Final    >=100,000 COLONIES/mL KLEBSIELLA PNEUMONIAE >=100,000 COLONIES/mL PROVIDENCIA STUARTII    Report Status 06/26/2023 FINAL  Final   Organism ID, Bacteria KLEBSIELLA PNEUMONIAE (A)  Final   Organism ID, Bacteria PROVIDENCIA STUARTII (A)  Final      Susceptibility   Klebsiella pneumoniae - MIC*    AMPICILLIN >=32 RESISTANT Resistant     CEFAZOLIN <=4 SENSITIVE Sensitive     CEFEPIME <=0.12 SENSITIVE Sensitive     CEFTRIAXONE <=0.25 SENSITIVE Sensitive     CIPROFLOXACIN <=0.25 SENSITIVE Sensitive     GENTAMICIN <=1 SENSITIVE Sensitive     IMIPENEM <=0.25 SENSITIVE Sensitive     NITROFURANTOIN 32 SENSITIVE Sensitive     TRIMETH/SULFA <=20 SENSITIVE Sensitive     AMPICILLIN/SULBACTAM 8 SENSITIVE Sensitive     PIP/TAZO 16 SENSITIVE Sensitive ug/mL    * >=100,000 COLONIES/mL KLEBSIELLA PNEUMONIAE   Providencia stuartii - MIC*    AMPICILLIN >=32 RESISTANT Resistant     CEFEPIME <=0.12 SENSITIVE Sensitive     CEFTRIAXONE 0.5 SENSITIVE Sensitive     CIPROFLOXACIN 2 RESISTANT Resistant     GENTAMICIN 4 SENSITIVE Sensitive     IMIPENEM 2 SENSITIVE Sensitive     NITROFURANTOIN 256 RESISTANT Resistant     TRIMETH/SULFA >=320 RESISTANT Resistant     AMPICILLIN/SULBACTAM >=32 RESISTANT Resistant     PIP/TAZO 8 SENSITIVE Sensitive ug/mL    * >=100,000 COLONIES/mL PROVIDENCIA STUARTII  Respiratory (~20 pathogens) panel by PCR     Status: None   Collection Time: 06/24/23  4:57 AM   Specimen: Nasopharyngeal Swab; Respiratory  Result Value Ref  Range Status   Adenovirus NOT DETECTED NOT DETECTED Final   Coronavirus 229E NOT DETECTED NOT DETECTED Final    Comment: (NOTE) The Coronavirus on the Respiratory Panel, DOES NOT test for the novel  Coronavirus (2019 nCoV)    Coronavirus HKU1 NOT DETECTED NOT DETECTED Final   Coronavirus NL63 NOT DETECTED NOT DETECTED Final   Coronavirus OC43 NOT DETECTED  NOT DETECTED Final   Metapneumovirus NOT DETECTED NOT DETECTED Final   Rhinovirus / Enterovirus NOT DETECTED NOT DETECTED Final   Influenza A NOT DETECTED NOT DETECTED Final   Influenza B NOT DETECTED NOT DETECTED Final   Parainfluenza Virus 1 NOT DETECTED NOT DETECTED Final   Parainfluenza Virus 2 NOT DETECTED NOT DETECTED Final   Parainfluenza Virus 3 NOT DETECTED NOT DETECTED Final   Parainfluenza Virus 4 NOT DETECTED NOT DETECTED Final   Respiratory Syncytial Virus NOT DETECTED NOT DETECTED Final   Bordetella pertussis NOT DETECTED NOT DETECTED Final   Bordetella Parapertussis NOT DETECTED NOT DETECTED Final   Chlamydophila pneumoniae NOT DETECTED NOT DETECTED Final   Mycoplasma pneumoniae NOT DETECTED NOT DETECTED Final    Comment: Performed at [facility], [address], [place], [place] [address]    Procedures and diagnostic studies:  No results found.              LOS: 6 days   Giada Schoppe  [facility]   Pager on [email or web address]. If 7PM-7AM, please contact night-coverage at [email or web address]     06/30/2023, 11:24 AM          [facility]

## 2023-06-30 NOTE — Progress Notes (Signed)
 PT Cancellation Note  Patient Details Name: Laura Wilcox MRN: [id number] DOB: [birth year]   Cancelled Treatment:    Reason Eval/Treat Not Completed: Other (comment). Pt is pending blood transfusion secondary to Hgb at 6.6; Will follow.   Markcus Lazenby 06/30/2023, 10:39 AM Elizabeth Palau, PT, DPT, GCS [phone]

## 2023-07-01 DIAGNOSIS — L089 Local infection of the skin and subcutaneous tissue, unspecified: Secondary | ICD-10-CM | POA: Diagnosis not present

## 2023-07-01 DIAGNOSIS — T148XXA Other injury of unspecified body region, initial encounter: Secondary | ICD-10-CM | POA: Diagnosis not present

## 2023-07-01 LAB — CBC WITH DIFFERENTIAL/PLATELET
Abs Immature Granulocytes: 1.72 10*3/uL — ABNORMAL HIGH (ref 0.00–0.07)
Basophils Absolute: 0.2 10*3/uL — ABNORMAL HIGH (ref 0.0–0.1)
Basophils Relative: 1 %
Eosinophils Absolute: 0.9 10*3/uL — ABNORMAL HIGH (ref 0.0–0.5)
Eosinophils Relative: 6 %
HCT: 23.2 % — ABNORMAL LOW (ref 36.0–46.0)
Hemoglobin: 7.5 g/dL — ABNORMAL LOW (ref 12.0–15.0)
Immature Granulocytes: 11 %
Lymphocytes Relative: 8 %
Lymphs Abs: 1.2 10*3/uL (ref 0.7–4.0)
MCH: 27.3 pg (ref 26.0–34.0)
MCHC: 32.3 g/dL (ref 30.0–36.0)
MCV: 84.4 fL (ref 80.0–100.0)
Monocytes Absolute: 1.7 10*3/uL — ABNORMAL HIGH (ref 0.1–1.0)
Monocytes Relative: 11 %
Neutro Abs: 9.8 10*3/uL — ABNORMAL HIGH (ref 1.7–7.7)
Neutrophils Relative %: 63 %
Platelets: 598 10*3/uL — ABNORMAL HIGH (ref 150–400)
RBC: 2.75 MIL/uL — ABNORMAL LOW (ref 3.87–5.11)
RDW: 21.6 % — ABNORMAL HIGH (ref 11.5–15.5)
Smear Review: NORMAL
WBC: 15.4 10*3/uL — ABNORMAL HIGH (ref 4.0–10.5)
nRBC: 0.7 % — ABNORMAL HIGH (ref 0.0–0.2)

## 2023-07-01 LAB — RENAL FUNCTION PANEL
Albumin: 2.4 g/dL — ABNORMAL LOW (ref 3.5–5.0)
Anion gap: 7 (ref 5–15)
BUN: 20 mg/dL (ref 8–23)
CO2: 28 mmol/L (ref 22–32)
Calcium: 8.9 mg/dL (ref 8.9–10.3)
Chloride: 103 mmol/L (ref 98–111)
Creatinine, Ser: 0.84 mg/dL (ref 0.44–1.00)
GFR, Estimated: >60 mL/min (ref >60)
Glucose, Bld: 79 mg/dL (ref 70–99)
Phosphorus: 3.6 mg/dL (ref 2.5–4.6)
Potassium: 4.5 mmol/L (ref 3.5–5.1)
Sodium: 138 mmol/L (ref 135–145)

## 2023-07-01 LAB — TYPE AND SCREEN
ABO/RH(D): O POS
Antibody Screen: NEGATIVE
Unit division: 0

## 2023-07-01 LAB — BPAM RBC
Blood Product Expiration Date: 202504062359
ISSUE DATE / TIME: 202503311203
Unit Type and Rh: 9500

## 2023-07-01 MED ORDER — POLYVINYL ALCOHOL 1.4 % OP SOLN: 1.0000 [drp] | Freq: Four times a day (QID) | OPHTHALMIC | Status: DC | PRN

## 2023-07-01 MED ORDER — DIPHENHYDRAMINE HCL 50 MG/ML IJ SOLN: 25.0000 mg | INTRAMUSCULAR | Status: DC | PRN

## 2023-07-01 MED ORDER — GLYCOPYRROLATE 0.2 MG/ML IJ SOLN: 0.2000 mg | INTRAMUSCULAR | Status: DC | PRN

## 2023-07-01 MED ORDER — MORPHINE BOLUS VIA INFUSION: 5.0000 mg | INTRAVENOUS | Status: DC | PRN

## 2023-07-01 MED ORDER — ACETAMINOPHEN 650 MG RE SUPP: 650.0000 mg | Freq: Four times a day (QID) | RECTAL | Status: DC | PRN

## 2023-07-01 MED ORDER — GLYCOPYRROLATE 1 MG PO TABS: 1.0000 mg | ORAL_TABLET | ORAL | Status: DC | PRN

## 2023-07-01 MED ORDER — SODIUM CHLORIDE 0.9 % IV SOLN: INTRAVENOUS | Status: DC

## 2023-07-01 MED ORDER — ACETAMINOPHEN 325 MG PO TABS: 650.0000 mg | ORAL_TABLET | Freq: Four times a day (QID) | ORAL | Status: DC | PRN

## 2023-07-01 MED ORDER — LORAZEPAM 2 MG/ML IJ SOLN: 2.0000 mg | INTRAMUSCULAR | Status: DC | PRN

## 2023-07-01 MED ORDER — MORPHINE 100MG IN NS 100ML (1MG/ML) PREMIX INFUSION
0.0000 mg/h | INTRAVENOUS | Status: DC
  Administered 2023-07-01 – 2023-07-02 (×2): 5 mg/h via INTRAVENOUS
  Filled 2023-07-01 (×2): qty 100

## 2023-07-01 NOTE — Progress Notes (Addendum)
 [facility] Liaison note  Received request from Transitions of Care Manger for family interest in [facility].  Eligibility has been confirmed.  Met with daughter  to confirm interest and explain services.  Family agreeable to transfer tomorrow .  Transitions of Care manager aware.  RN please call report to [phone] [facility]) prior to patient leaving the unit.  Please send signed and completed DNR with patient at discharge.  Thank you  Redge Gainer,  [facility] Liaison [phone]

## 2023-07-01 NOTE — Progress Notes (Signed)
 Nutrition Brief Note  Chart reviewed. Pt now transitioning to comfort care.  No further nutrition interventions planned at this time.  Please re-consult as needed.   Levada Schilling, RD, LDN, CDCES Registered Dietitian III Certified Diabetes Care and Education Specialist If unable to reach this RD, please use "RD Inpatient" group chat on secure chat between hours of 8am-4 pm daily

## 2023-07-01 NOTE — Progress Notes (Addendum)
 Progress Note    Laura Wilcox  VWU:[id number] DOB: 02-11-1927  DOA: 06/23/2023 PCP: Smiley Houseman, NP      Brief Narrative:    Medical records reviewed and are as summarized below:  Laura Wilcox is a 89 y.o. female  with medical history significant of dementia, hypothyroidism, depression with anxiety, CKD-3B, skin cancer, UTI, myelofibrosis and polycythemia rubra vera, chronic left foot wound after hematoma (VAC), chronic cough, recent discharge from [facility] with pneumonia, who [facility] with altered mental status. According to her son, patient has been confused for about a week and has had worsened in the preceding 2 days prior to admission. Oxygen saturation was in the 70s in the ED.    Assessment/Plan:   Principal Problem:   Wound infection Active Problems:   Wound of left lower extremity   UTI (urinary tract infection)   Dementia without behavioral disturbance (HCC)   Acute metabolic encephalopathy   Acute respiratory failure with hypoxia (HCC)   Chronic kidney disease, stage 3b (HCC)   Hypothyroidism   Anemia in chronic kidney disease   Polycythemia rubra vera (HCC)   Myelofibrosis (HCC)   Depression with anxiety   Palliative care encounter   Malnutrition of moderate degree    Body mass index is 9.9 kg/m.    Left foot wound infection Left leg hematoma Acute UTI Urine culture grew Klebsiella pneumoniae and Providencia stuartii Completed 7 days of IV antibiotics on 06/29/2023 She was previously on IV vancomycin, cefepime, Flagyl, and ceftriaxone Discontinue doxycycline since patient has been transitioned to comfort care CRP 3.6.  Lactic acid was normal. Staph epidermidis isolated in 1 blood culture bottle is a contaminant WBC still elevated but chart review showed that patient has had intermittent elevation in WBC.   Acute metabolic encephalopathy on underlying dementia, delirium: Patient  is now on comfort care   Acute hypoxic respiratory failure Improved.  She is tolerating room air Patient also has COPD (emphysema noted on CT chest)   CKD stage IIIb Creatinine stable   Hypoglycemia Improved   Hypophosphatemia Improved   Acute on chronic anemia, anemia of chronic disease S/p transfusion 1 unit of PRBCs on 06/30/2023 for hemoglobin of 6.6. Hemoglobin up to 7.5.   Decubitus ulcers (present on admission) Bilateral heel deep tissue injury, stage II decubitus ulcers on the back   Comorbidities include polycythemia rubra vera, myelofibrosis (follows with Dr. Cathie Hoops, oncologist), protein calorie malnutrition, depression, anxiety,  hypothyroidism  Plan of care was discussed with Laura Wilcox, daughter, at the bedside.  She and her brother have decided to go with hospice.  She is okay with transitioning to comfort care as soon as possible.  She is hoping that patient will be eligible for comfort care at the hospice home. She understands that  all oral medications will be discontinued.  The patient will be started on IV morphine drip.  Use IV Benadryl as needed for itching, IV Ativan as needed for anxiety.  Discontinue wound care since wound dressing causes her a lot of pain.  Consult hospice team to assist with placement.  Laura Wilcox, Child psychotherapist, has been updated    Diet Order             Diet regular Fluid consistency: Thin  Diet effective now                            Consultants: Palliative care Podiatrist  Procedures: None    Medications:    Chlorhexidine Gluconate Cloth  6 each Topical Daily   Continuous Infusions:  sodium chloride     morphine        Anti-infectives (From admission, onward)    Start     Dose/Rate Route Frequency Ordered Stop   06/30/23 1000  doxycycline (VIBRA-TABS) tablet 100 mg  Status:  Discontinued        100 mg Oral Every 12 hours 06/30/23 0806 07/01/23 1413   06/26/23 1000  cefTRIAXone (ROCEPHIN) 1 g in sodium  chloride 0.9 % 100 mL IVPB        1 g 200 mL/hr over 30 Minutes Intravenous Every 24 hours 06/26/23 0826 06/29/23 1039   06/25/23 2030  vancomycin (VANCOCIN) IVPB 1000 mg/200 mL premix  Status:  Discontinued        1,000 mg 200 mL/hr over 60 Minutes Intravenous Every 48 hours 06/24/23 0137 06/25/23 0919   06/25/23 2030  vancomycin (VANCOREADY) IVPB 500 mg/100 mL  Status:  Discontinued        500 mg 100 mL/hr over 60 Minutes Intravenous Every 24 hours 06/25/23 0919 06/27/23 1424   06/24/23 1800  ceFEPIme (MAXIPIME) 2 g in sodium chloride 0.9 % 100 mL IVPB  Status:  Discontinued        2 g 200 mL/hr over 30 Minutes Intravenous Every 24 hours 06/24/23 0134 06/26/23 0826   06/24/23 0700  metroNIDAZOLE (FLAGYL) IVPB 500 mg  Status:  Discontinued        500 mg 100 mL/hr over 60 Minutes Intravenous Every 12 hours 06/24/23 0005 06/26/23 0805   06/23/23 1745  ceFEPIme (MAXIPIME) 2 g in sodium chloride 0.9 % 100 mL IVPB        2 g 200 mL/hr over 30 Minutes Intravenous  Once 06/23/23 1744 06/23/23 1900   06/23/23 1745  metroNIDAZOLE (FLAGYL) IVPB 500 mg        500 mg 100 mL/hr over 60 Minutes Intravenous  Once 06/23/23 1744 06/23/23 2029   06/23/23 1745  vancomycin (VANCOCIN) IVPB 1000 mg/200 mL premix        1,000 mg 200 mL/hr over 60 Minutes Intravenous  Once 06/23/23 1744 06/23/23 2133              Family Communication/Anticipated D/C date and plan/Code Status   DVT prophylaxis:      Code Status: Do not attempt resuscitation (DNR) - Comfort care  Family Communication: Plan discussed with Laura Wilcox, daughter at bedside Disposition Plan: Plan to discharge to SNF   Status is: Inpatient Remains inpatient appropriate because: Severe anemia       Subjective:   Interval events noted.  She is sleepy and unable to provide any history.  Objective:    Vitals:   06/30/23 2053 07/01/23 0338 07/01/23 0829 07/01/23 1008  BP: 113/88 (!) 84/58 (!) 150/48 (!) 104/55  Pulse: 80 76  72 64  Resp: 18 20 19    Temp: 98.7 F (37.1 C) 98.5 F (36.9 C) 97.7 F (36.5 C)   TempSrc:      SpO2: 90% 100% 99%   Weight:      Height:       No data found.  No intake or output data in the 24 hours ending 07/01/23 1426  Filed Weights   06/27/23 0344 06/29/23 0500 06/30/23 0200  Weight: 25.4 kg 23.8 kg 23 kg    Exam:  GEN: NAD SKIN: Warm and dry EYES: No acute abnormality ENT: MMM  CV: RRR PULM: CTA B ABD: soft, nondistended CNS: Sleepy (likely from opioid analgesics) EXT: No edema       Data Reviewed:   I have personally reviewed following labs and imaging studies:  Labs: Labs show the following:   Basic Metabolic Panel: Recent Labs  Lab 06/26/23 0503 06/27/23 0517 06/28/23 0522 06/29/23 0700 07/01/23 0503  NA 136  --   --  135 138  K 3.9  --   --  4.1 4.5  CL 106  --   --  100 103  CO2 21*  --   --  24 28  GLUCOSE 85  --   --  84 79  BUN 21  --   --  16 20  CREATININE 0.90  --   --  0.65 0.84  CALCIUM 8.7*  --   --  9.1 8.9  MG 1.7 1.7 1.7  --   --   PHOS 2.4* 2.0* 2.6 2.1* 3.6   GFR Estimated Creatinine Clearance: 14.9 mL/min (by C-G formula based on SCr of 0.84 mg/dL). Liver Function Tests: Recent Labs  Lab 06/29/23 0700 07/01/23 0503  ALBUMIN 2.7* 2.4*   No results for input(s): "LIPASE", "AMYLASE" in the last 168 hours. No results for input(s): "AMMONIA" in the last 168 hours. Coagulation profile No results for input(s): "INR", "PROTIME" in the last 168 hours.   CBC: Recent Labs  Lab 06/26/23 0503 06/29/23 0700 06/30/23 0535 06/30/23 1625 07/01/23 0503  WBC 10.4 16.9* 16.4*  --  15.4*  NEUTROABS 7.2  --  10.4*  --  9.8*  HGB 7.6* 8.7* 6.6* 8.9* 7.5*  HCT 25.5* 29.2* 21.5* 29.0* 23.2*  MCV 86.4 86.4 86.3  --  84.4  PLT 764* 788* 646*  --  598*   Cardiac Enzymes: No results for input(s): "CKTOTAL", "CKMB", "CKMBINDEX", "TROPONINI" in the last 168 hours. BNP (last 3 results) No results for input(s): "PROBNP" in the  last 8760 hours. CBG: Recent Labs  Lab 06/24/23 1931 06/24/23 2224 06/25/23 0912  GLUCAP 66* 79 60*   D-Dimer: No results for input(s): "DDIMER" in the last 72 hours. Hgb A1c: No results for input(s): "HGBA1C" in the last 72 hours. Lipid Profile: No results for input(s): "CHOL", "HDL", "LDLCALC", "TRIG", "CHOLHDL", "LDLDIRECT" in the last 72 hours. Thyroid function studies: No results for input(s): "TSH", "T4TOTAL", "T3FREE", "THYROIDAB" in the last 72 hours.  Invalid input(s): "FREET3" Anemia work up: No results for input(s): "VITAMINB12", "FOLATE", "FERRITIN", "TIBC", "IRON", "RETICCTPCT" in the last 72 hours. Sepsis Labs: Recent Labs  Lab 06/26/23 0503 06/29/23 0700 06/30/23 0535 07/01/23 0503  WBC 10.4 16.9* 16.4* 15.4*    Microbiology Recent Results (from the past 240 hours)  Blood Culture (routine x 2)     Status: Abnormal   Collection Time: 06/23/23  5:36 PM   Specimen: BLOOD  Result Value Ref Range Status   Specimen Description   Final    BLOOD BLOOD LEFT FOREARM Performed at [facility], [address] [place], [place] [address]    Special Requests   Final    BOTTLES DRAWN AEROBIC AND ANAEROBIC Blood Culture results may not be optimal due to an inadequate volume of blood received in culture bottles Performed at [facility], [address] [place], [place] [address]    Culture  Setup Time   Final    GRAM POSITIVE COCCI AEROBIC BOTTLE ONLY Organism ID to follow CRITICAL RESULT CALLED TO, READ BACK BY AND VERIFIED WITH: A. DOBBS  2006 06/24/23 LRL Performed at [facility], [address] [place], [place] [address]    Culture (A)  Final    STAPHYLOCOCCUS EPIDERMIDIS THE SIGNIFICANCE OF ISOLATING THIS ORGANISM FROM A SINGLE SET OF BLOOD CULTURES WHEN MULTIPLE SETS ARE DRAWN IS UNCERTAIN. PLEASE NOTIFY THE MICROBIOLOGY DEPARTMENT WITHIN ONE WEEK IF SPECIATION AND SENSITIVITIES ARE REQUIRED. Performed at [facility], [address], [place], [place] [address]    Report Status 06/26/2023 FINAL  Final  Blood Culture ID Panel (Reflexed)     Status: Abnormal   Collection Time: 06/23/23  5:36 PM  Result Value Ref Range Status   Enterococcus faecalis NOT DETECTED NOT DETECTED Final   Enterococcus Faecium NOT DETECTED NOT DETECTED Final   Listeria monocytogenes NOT DETECTED NOT DETECTED Final   Staphylococcus species DETECTED (A) NOT DETECTED Final    Comment: CRITICAL RESULT CALLED TO, READ BACK BY AND VERIFIED WITH: A. DOBBS 2006 06/24/23 LRL    Staphylococcus aureus (BCID) NOT DETECTED NOT DETECTED Final   Staphylococcus epidermidis DETECTED (A) NOT DETECTED Final    Comment: Methicillin (oxacillin) resistant coagulase negative staphylococcus. Possible blood culture contaminant (unless isolated from more than one blood culture draw or clinical case suggests pathogenicity). No antibiotic treatment is indicated for blood  culture contaminants. CRITICAL RESULT CALLED TO, READ BACK BY AND VERIFIED WITH: A. DOBBS 2006 06/24/23 LRL    Staphylococcus lugdunensis NOT DETECTED NOT DETECTED Final   Streptococcus species NOT DETECTED NOT DETECTED Final   Streptococcus agalactiae NOT DETECTED NOT DETECTED Final   Streptococcus pneumoniae NOT DETECTED NOT DETECTED Final   Streptococcus pyogenes NOT DETECTED NOT DETECTED Final   A.calcoaceticus-baumannii NOT DETECTED NOT DETECTED Final   Bacteroides fragilis NOT DETECTED NOT DETECTED Final   Enterobacterales NOT DETECTED NOT DETECTED Final   Enterobacter cloacae complex NOT DETECTED NOT DETECTED Final   Escherichia coli NOT DETECTED NOT DETECTED Final   Klebsiella aerogenes NOT DETECTED NOT DETECTED Final   Klebsiella oxytoca NOT DETECTED NOT DETECTED Final   Klebsiella pneumoniae NOT DETECTED NOT DETECTED Final   Proteus species NOT DETECTED NOT DETECTED Final   Salmonella species NOT DETECTED NOT DETECTED Final   Serratia marcescens NOT DETECTED NOT DETECTED  Final   Haemophilus influenzae NOT DETECTED NOT DETECTED Final   Neisseria meningitidis NOT DETECTED NOT DETECTED Final   Pseudomonas aeruginosa NOT DETECTED NOT DETECTED Final   Stenotrophomonas maltophilia NOT DETECTED NOT DETECTED Final   Candida albicans NOT DETECTED NOT DETECTED Final   Candida auris NOT DETECTED NOT DETECTED Final   Candida glabrata NOT DETECTED NOT DETECTED Final   Candida krusei NOT DETECTED NOT DETECTED Final   Candida parapsilosis NOT DETECTED NOT DETECTED Final   Candida tropicalis NOT DETECTED NOT DETECTED Final   Cryptococcus neoformans/gattii NOT DETECTED NOT DETECTED Final   Methicillin resistance mecA/C DETECTED (A) NOT DETECTED Final    Comment: CRITICAL RESULT CALLED TO, READ BACK BY AND VERIFIED WITH: A. DOBBS 2006 06/24/23 LRL Performed at [facility], [address] [place], [place] [address]   Blood Culture (routine x 2)     Status: None   Collection Time: 06/23/23  5:48 PM   Specimen: BLOOD  Result Value Ref Range Status   Specimen Description BLOOD BLOOD RIGHT FOREARM  Final   Special Requests   Final    BOTTLES DRAWN AEROBIC AND ANAEROBIC Blood Culture results may not be optimal due to an inadequate volume of blood received in culture bottles   Culture  Final    NO GROWTH 5 DAYS Performed at [facility], [address] [place], [place] [address]    Report Status 06/28/2023 FINAL  Final  Resp panel by RT-PCR (RSV, Flu A&B, Covid) Anterior Nasal Swab     Status: None   Collection Time: 06/23/23  8:45 PM   Specimen: Anterior Nasal Swab  Result Value Ref Range Status   SARS Coronavirus 2 by RT PCR NEGATIVE NEGATIVE Final    Comment: (NOTE) SARS-CoV-2 target nucleic acids are NOT DETECTED.  The SARS-CoV-2 RNA is generally detectable in upper respiratory specimens during the acute phase of infection. The lowest concentration of SARS-CoV-2 viral copies this assay can detect is 138 copies/mL. A negative result does not  preclude SARS-Cov-2 infection and should not be used as the sole basis for treatment or other patient management decisions. A negative result may occur with  improper specimen collection/handling, submission of specimen other than nasopharyngeal swab, presence of viral mutation(s) within the areas targeted by this assay, and inadequate number of viral copies(<138 copies/mL). A negative result must be combined with clinical observations, patient history, and epidemiological information. The expected result is Negative.  Fact Sheet for Patients:  BloggerCourse.com  Fact Sheet for Healthcare Providers:  SeriousBroker.it  This test is no t yet approved or cleared by the [place] FDA and  has been authorized for detection and/or diagnosis of SARS-CoV-2 by FDA under an Emergency Use Authorization (EUA). This EUA will remain  in effect (meaning this test can be used) for the duration of the COVID-19 declaration under Section 564(b)(1) of the Act, 21 U.S.C.section 360bbb-3(b)(1), unless the authorization is terminated  or revoked sooner.       Influenza A by PCR NEGATIVE NEGATIVE Final   Influenza B by PCR NEGATIVE NEGATIVE Final    Comment: (NOTE) The Xpert Xpress SARS-CoV-2/FLU/RSV plus assay is intended as an aid in the diagnosis of influenza from Nasopharyngeal swab specimens and should not be used as a sole basis for treatment. Nasal washings and aspirates are unacceptable for Xpert Xpress SARS-CoV-2/FLU/RSV testing.  Fact Sheet for Patients: BloggerCourse.com  Fact Sheet for Healthcare Providers: SeriousBroker.it  This test is not yet approved or cleared by the [place] FDA and has been authorized for detection and/or diagnosis of SARS-CoV-2 by FDA under an Emergency Use Authorization (EUA). This EUA will remain in effect (meaning this test can be used) for the  duration of the COVID-19 declaration under Section 564(b)(1) of the Act, 21 U.S.C. section 360bbb-3(b)(1), unless the authorization is terminated or revoked.     Resp Syncytial Virus by PCR NEGATIVE NEGATIVE Final    Comment: (NOTE) Fact Sheet for Patients: BloggerCourse.com  Fact Sheet for Healthcare Providers: SeriousBroker.it  This test is not yet approved or cleared by the [place] FDA and has been authorized for detection and/or diagnosis of SARS-CoV-2 by FDA under an Emergency Use Authorization (EUA). This EUA will remain in effect (meaning this test can be used) for the duration of the COVID-19 declaration under Section 564(b)(1) of the Act, 21 U.S.C. section 360bbb-3(b)(1), unless the authorization is terminated or revoked.  Performed at [facility], [address] [place], [place] [address]   Urine Culture     Status: Abnormal   Collection Time: 06/23/23  9:23 PM   Specimen: Urine, Random  Result Value Ref Range Status   Specimen Description   Final    URINE, RANDOM Performed at [facility], [address] [place], [place]  [address]    Special Requests   Final    NONE Reflexed from [id number] Performed at [facility], [address] [place], [place] [address]    Culture (A)  Final    >=100,000 COLONIES/mL KLEBSIELLA PNEUMONIAE >=100,000 COLONIES/mL PROVIDENCIA STUARTII    Report Status 06/26/2023 FINAL  Final   Organism ID, Bacteria KLEBSIELLA PNEUMONIAE (A)  Final   Organism ID, Bacteria PROVIDENCIA STUARTII (A)  Final      Susceptibility   Klebsiella pneumoniae - MIC*    AMPICILLIN >=32 RESISTANT Resistant     CEFAZOLIN <=4 SENSITIVE Sensitive     CEFEPIME <=0.12 SENSITIVE Sensitive     CEFTRIAXONE <=0.25 SENSITIVE Sensitive     CIPROFLOXACIN <=0.25 SENSITIVE Sensitive     GENTAMICIN <=1 SENSITIVE Sensitive     IMIPENEM <=0.25 SENSITIVE Sensitive     NITROFURANTOIN 32  SENSITIVE Sensitive     TRIMETH/SULFA <=20 SENSITIVE Sensitive     AMPICILLIN/SULBACTAM 8 SENSITIVE Sensitive     PIP/TAZO 16 SENSITIVE Sensitive ug/mL    * >=100,000 COLONIES/mL KLEBSIELLA PNEUMONIAE   Providencia stuartii - MIC*    AMPICILLIN >=32 RESISTANT Resistant     CEFEPIME <=0.12 SENSITIVE Sensitive     CEFTRIAXONE 0.5 SENSITIVE Sensitive     CIPROFLOXACIN 2 RESISTANT Resistant     GENTAMICIN 4 SENSITIVE Sensitive     IMIPENEM 2 SENSITIVE Sensitive     NITROFURANTOIN 256 RESISTANT Resistant     TRIMETH/SULFA >=320 RESISTANT Resistant     AMPICILLIN/SULBACTAM >=32 RESISTANT Resistant     PIP/TAZO 8 SENSITIVE Sensitive ug/mL    * >=100,000 COLONIES/mL PROVIDENCIA STUARTII  Respiratory (~20 pathogens) panel by PCR     Status: None   Collection Time: 06/24/23  4:57 AM   Specimen: Nasopharyngeal Swab; Respiratory  Result Value Ref Range Status   Adenovirus NOT DETECTED NOT DETECTED Final   Coronavirus 229E NOT DETECTED NOT DETECTED Final    Comment: (NOTE) The Coronavirus on the Respiratory Panel, DOES NOT test for the novel  Coronavirus (2019 nCoV)    Coronavirus HKU1 NOT DETECTED NOT DETECTED Final   Coronavirus NL63 NOT DETECTED NOT DETECTED Final   Coronavirus OC43 NOT DETECTED NOT DETECTED Final   Metapneumovirus NOT DETECTED NOT DETECTED Final   Rhinovirus / Enterovirus NOT DETECTED NOT DETECTED Final   Influenza A NOT DETECTED NOT DETECTED Final   Influenza B NOT DETECTED NOT DETECTED Final   Parainfluenza Virus 1 NOT DETECTED NOT DETECTED Final   Parainfluenza Virus 2 NOT DETECTED NOT DETECTED Final   Parainfluenza Virus 3 NOT DETECTED NOT DETECTED Final   Parainfluenza Virus 4 NOT DETECTED NOT DETECTED Final   Respiratory Syncytial Virus NOT DETECTED NOT DETECTED Final   Bordetella pertussis NOT DETECTED NOT DETECTED Final   Bordetella Parapertussis NOT DETECTED NOT DETECTED Final   Chlamydophila pneumoniae NOT DETECTED NOT DETECTED Final   Mycoplasma pneumoniae  NOT DETECTED NOT DETECTED Final    Comment: Performed at [facility], [address] [place], [place] [address]    Procedures and diagnostic studies:  No results found.              LOS: 7 days   Laura Wilcox  [facility]   Pager on [email or web address]. If 7PM-7AM, please contact night-coverage at [email or web address]     07/01/2023, 2:26 PM          [facility]

## 2023-07-01 NOTE — TOC Progression Note (Addendum)
 Transition of Care [facility]) - Progression Note    Patient Details  Name: Laura Wilcox MRN: [id number] Date of Birth: 1926/12/27  Transition of Care [facility]) CM/SW Contact  Margarito Liner, LCSW Phone Number: 07/01/2023, 11:49 AM  Clinical Narrative:   CSW spoke to Kaka with admissions at [facility] She stated she only received an FL2 in referral that was faxed over last week. CSW faxed over additional clinicals for her to review.  3:14 pm: Patient's children have decided to transition to comfort care and would like for patient to go to the hospice home in [place]. [facility] liaison is aware. CSW and daughter discussed backup plan in case the hospice home cannot accept her. Daughter is unable to bring her home with her. Would pursue LTC SNF with hospice. Daughter said she already called [facility] and they told her they do not manage hospice patients.  Expected Discharge Plan and Services                                               Social Determinants of Health (SDOH) Interventions SDOH Screenings   Food Insecurity: Patient Unable To Answer (06/24/2023)  Housing: Patient Unable To Answer (06/24/2023)  Transportation Needs: Patient Unable To Answer (06/24/2023)  Utilities: Patient Unable To Answer (06/24/2023)  Alcohol Screen: Low Risk  (06/18/2021)  Depression (PHQ2-9): High Risk (06/18/2021)  Social Connections: Patient Unable To Answer (06/24/2023)  Tobacco Use: Medium Risk (06/24/2023)    Readmission Risk Interventions    06/24/2023    2:03 PM 06/26/2021    4:26 PM  Readmission Risk Prevention Plan  Transportation Screening Complete Complete  PCP or Specialist Appt within 5-7 Days Complete Complete  Home Care Screening Complete Complete  Medication Review (RN CM) Complete Complete

## 2023-07-01 NOTE — Progress Notes (Signed)
 Physical Therapy Treatment Patient Details Name: Laura Wilcox MRN: [id number] DOB: Feb 02, 1927 Today's Date: 07/01/2023   History of Present Illness Pt is a 89 y.o. female who presents with altered mental status. Admitted for UTI, LLE wound.  PMH of dementia, hypothyroidism, depression with anxiety, CKD-3B, skin cancer, UTI, myelofibrosis and polycythemia rubra vera, chronic left foot wound after hematoma (VAC) s/p LLE angiogram and stent L superficial popliteal artery 1/14 with debridement and placement of skin substitute 1/27, 2/3, 3/10.    PT Comments  Pt is making no progress towards goals and appears very pain limited in B LEs with any movement. PT/OT co-treatment performed with daughter at bedside. Pt able to sit at EOB for extended time, however needs calming techniques as pt very confused and at times agitated with movement. Discussion with family and daughter requested this Clinical research associate to communicate with care team. Secure chat sent. Post session, order discontinued.   If plan is discharge home, recommend the following: Two people to help with walking and/or transfers;Two people to help with bathing/dressing/bathroom;Direct supervision/assist for medications management;Help with stairs or ramp for entrance;Assist for transportation;Assistance with cooking/housework;Supervision due to cognitive status   Can travel by private vehicle     No  Equipment Recommendations  Other (comment)    Recommendations for Other Services       Precautions / Restrictions Precautions Precautions: Fall Recall of Precautions/Restrictions: Impaired Restrictions Weight Bearing Restrictions Per Provider Order: Yes LLE Weight Bearing Per Provider Order: Non weight bearing Other Position/Activity Restrictions: per daughter, she was told no restrictions. Pt appears in pain and will self elect to be NWB     Mobility  Bed Mobility Overal bed mobility: Needs Assistance Bed Mobility: Supine to Sit, Sit to  Supine     Supine to sit: Max assist, +2 for physical assistance Sit to supine: Max assist, +2 for physical assistance   General bed mobility comments: pt screams out in pain with any movement. Once seated at EOB, needs interminute assistance to maintain balance. Pt able to tolerate around 8-10 mins of sitting prior to fatigue. Total assist for rolling and repositioning with pillows    Transfers                   General transfer comment: unsafe    Ambulation/Gait                   Stairs             Wheelchair Mobility     Tilt Bed    Modified Rankin (Stroke Patients Only)       Balance Overall balance assessment: Needs assistance Sitting-balance support: Feet supported Sitting balance-Leahy Scale: Fair                                      Hotel manager: Impaired Factors Affecting Communication: Hearing impaired  Cognition Arousal: Alert Behavior During Therapy: WFL for tasks assessed/performed   PT - Cognitive impairments: History of cognitive impairments                       PT - Cognition Comments: per daughter, pt has declined rapidly with hospitalization. Doesn't appear to recognize family or understand situation Following commands: Impaired Following commands impaired: Follows one step commands inconsistently    Cueing Cueing Techniques: Verbal cues  Exercises Other Exercises Other Exercises: attempted seated ther-ex with B  LE, unable to tolerate secondary to pain. Assisted in repositioning and able to take a bite of a banana at bedside    General Comments General comments (skin integrity, edema, etc.): Removed O2 with sats at 91% on RA      Pertinent Vitals/Pain Pain Assessment Pain Assessment: Faces Faces Pain Scale: Hurts worst Pain Location: BLE with mobility Pain Descriptors / Indicators: Grimacing, Guarding, Sore, Moaning Pain Intervention(s): Limited activity  within patient's tolerance, Repositioned, Relaxation, Monitored during session    Home Living                          Prior Function            PT Goals (current goals can now be found in the care plan section) Acute Rehab PT Goals Patient Stated Goal: to rest PT Goal Formulation: With patient Time For Goal Achievement: 07/09/23 Potential to Achieve Goals: Poor Progress towards PT goals: Not progressing toward goals - comment    Frequency    Min 1X/week      PT Plan      Co-evaluation PT/OT/SLP Co-Evaluation/Treatment: Yes Reason for Co-Treatment: For patient/therapist safety PT goals addressed during session: Mobility/safety with mobility OT goals addressed during session: ADL's and self-care      AM-PAC PT "6 Clicks" Mobility   Outcome Measure  Help needed turning from your back to your side while in a flat bed without using bedrails?: Total Help needed moving from lying on your back to sitting on the side of a flat bed without using bedrails?: Total Help needed moving to and from a bed to a chair (including a wheelchair)?: Total Help needed standing up from a chair using your arms (e.g., wheelchair or bedside chair)?: Total Help needed to walk in hospital room?: Total Help needed climbing 3-5 steps with a railing? : Total 6 Click Score: 6    End of Session   Activity Tolerance: Patient limited by pain Patient left: in bed;with bed alarm set;with family/visitor present Nurse Communication: Mobility status PT Visit Diagnosis: Difficulty in walking, not elsewhere classified (R26.2);Muscle weakness (generalized) (M62.81);Other abnormalities of gait and mobility (R26.89)     Time: 1122-1204 PT Time Calculation (min) (ACUTE ONLY): 42 min  Charges:    $Therapeutic Activity: 8-22 mins PT General Charges $$ ACUTE PT VISIT: 1 Visit                     Elizabeth Palau, PT, DPT, GCS [phone]    Laura Wilcox 07/01/2023, 2:26 PM

## 2023-07-01 NOTE — Plan of Care (Signed)

## 2023-07-01 NOTE — Progress Notes (Signed)
 Occupational Therapy Treatment Patient Details Name: Laura Wilcox MRN: [id number] DOB: Jun 23, 1926 Today's Date: 07/01/2023   History of present illness Pt is a 89 y.o. female who presents with altered mental status. Admitted for UTI, LLE wound.  PMH of dementia, hypothyroidism, depression with anxiety, CKD-3B, skin cancer, UTI, myelofibrosis and polycythemia rubra vera, chronic left foot wound after hematoma (VAC) s/p LLE angiogram and stent L superficial popliteal artery 1/14 with debridement and placement of skin substitute 1/27, 2/3, 3/10.   OT comments  Chart reviewed to date, pt greeted in bed, improved alertness with position changes. Step by step multi modal cues required for pt eyes to remain open. MAX A +2 required for bed mobility, poor- progress to fair sitting balance on edge of bed. MAX A required for feeding/grooming tasks, TOTAL A for LB dressing (prevlon boots). Pt is grimacing/crying in pain reporting "my legs hurt" at any attempted mobility. Pt does not appear to be in distress at rest while semi supine in bed. Pt daughter requests we notify team regarding GOC. OT will follow acutely as appropriate.        If plan is discharge home, recommend the following:  Two people to help with walking and/or transfers;A lot of help with bathing/dressing/bathroom   Equipment Recommendations  Hospital bed;Wheelchair (measurements OT);Hoyer lift    Recommendations for Other Services      Precautions / Restrictions Precautions Precautions: Fall Recall of Precautions/Restrictions: Impaired       Mobility Bed Mobility Overal bed mobility: Needs Assistance Bed Mobility: Supine to Sit, Sit to Supine     Supine to sit: Max assist, +2 for physical assistance Sit to supine: Max assist, +2 for physical assistance   General bed mobility comments: step by step mutli modal cues    Transfers                   General transfer comment: deferred, unsafe to attempt on this  date     Balance Overall balance assessment: Needs assistance Sitting-balance support: Feet supported Sitting balance-Leahy Scale: Fair Sitting balance - Comments: Poor, progress to fair for approx 15 minutes                                   ADL either performed or assessed with clinical judgement   ADL Overall ADL's : Needs assistance/impaired Eating/Feeding: Maximal assistance   Grooming: Maximal assistance           Upper Body Dressing : Maximal assistance;Total assistance   Lower Body Dressing: Maximal assistance;Total assistance                      Extremity/Trunk Assessment              Vision       Perception     Praxis     Communication Communication Communication: Impaired Factors Affecting Communication: Hearing impaired   Cognition Arousal: Alert Behavior During Therapy: WFL for tasks assessed/performed Cognition: History of cognitive impairments, Cognition impaired   Orientation impairments: Situation, Place, Time   Memory impairment (select all impairments): Short-term memory, Working [employer] fast streamer, Non-declarative long-term memory, Geneticist, molecular long-term memory Attention impairment (select first level of impairment): Focused attention Executive functioning impairment (select all impairments): Initiation, Organization, Sequencing, Reasoning, Problem solving                   Following commands: Impaired Following commands impaired: Follows one  step commands inconsistently      Cueing   Cueing Techniques: Verbal cues  Exercises Other Exercises Other Exercises: edu pt/daugther re: role of OT, role of rehab    Shoulder Instructions       General Comments Pt spo2 >90% on RA at end of session, nurse notified    Pertinent Vitals/ Pain       Pain Assessment Pain Assessment: PAINAD Breathing: normal Negative Vocalization: repeated troubled calling out, loud moaning/groaning, crying Facial Expression: facial  grimacing Body Language: tense, distressed pacing, fidgeting Consolability: distracted or reassured by voice/touch PAINAD Score: 6 Pain Location: BLE with mobility Pain Descriptors / Indicators: Grimacing, Guarding, Sore, Moaning Pain Intervention(s): Limited activity within patient's tolerance, Monitored during session, Repositioned  Home Living                                          Prior Functioning/Environment              Frequency  Min 1X/week        Progress Toward Goals  OT Goals(current goals can now be found in the care plan section)  Progress towards OT goals: Not progressing toward goals - comment (potential comfort care)  Acute Rehab OT Goals OT Goal Formulation: Patient unable to participate in goal setting  Plan      Co-evaluation    PT/OT/SLP Co-Evaluation/Treatment: Yes Reason for Co-Treatment: For patient/therapist safety   OT goals addressed during session: ADL's and self-care      AM-PAC OT "6 Clicks" Daily Activity     Outcome Measure   Help from another person eating meals?: A Lot Help from another person taking care of personal grooming?: A Lot Help from another person toileting, which includes using toliet, bedpan, or urinal?: Total Help from another person bathing (including washing, rinsing, drying)?: Total Help from another person to put on and taking off regular upper body clothing?: A Lot Help from another person to put on and taking off regular lower body clothing?: Total 6 Click Score: 9    End of Session Equipment Utilized During Treatment: Oxygen  OT Visit Diagnosis: Other abnormalities of gait and mobility (R26.89);Muscle weakness (generalized) (M62.81)   Activity Tolerance Patient limited by fatigue;Patient limited by pain   Patient Left in bed;with call bell/phone within reach;with bed alarm set;with family/visitor present   Nurse Communication Other (comment);Mobility status (pain meds, POC per  family request)        Time: 1124-1202 OT Time Calculation (min): 38 min  Charges: OT General Charges $OT Visit: 1 Visit OT Treatments $Therapeutic Activity: 8-22 mins Oleta Mouse, OTD OTR/L  07/01/23, 1:34 PM

## 2023-07-01 NOTE — IPAL (Signed)
  Interdisciplinary Goals of Care Family Meeting   Date carried out: 07/01/2023  Location of the meeting: Bedside  Member's involved: Physician and Family Member or next of kin  Durable Power of Attorney or acting medical decision maker: Mikeal Hawthorne, daughter  Discussion: We discussed goals of care for Laura Wilcox .  Diagnoses, prognosis and goals of care were discussed.  [name] said she and her brother were hoping that patient will improve with antibiotics, get adequate nutrition with the hope that wound on the left foot will heal.  However, they have not seen any progress.  They have noticed that patient is in a lot of pain.  She and her brother have unanimously decided to proceed with comfort care and will proceed hospice at residential hospice facility.    Code status:   Code Status: Limited: Do not attempt resuscitation (DNR) -DNR-LIMITED -Do Not Intubate/DNI    Disposition: In-patient comfort care  Time spent for the meeting: 10 minutes    Lurene Shadow, MD  07/01/2023, 2:07 PM

## 2023-07-02 DIAGNOSIS — E039 Hypothyroidism, unspecified: Secondary | ICD-10-CM | POA: Diagnosis not present

## 2023-07-02 DIAGNOSIS — N3000 Acute cystitis without hematuria: Secondary | ICD-10-CM | POA: Diagnosis not present

## 2023-07-02 DIAGNOSIS — G9341 Metabolic encephalopathy: Secondary | ICD-10-CM

## 2023-07-02 DIAGNOSIS — T148XXA Other injury of unspecified body region, initial encounter: Secondary | ICD-10-CM | POA: Diagnosis not present

## 2023-07-02 DIAGNOSIS — J9601 Acute respiratory failure with hypoxia: Secondary | ICD-10-CM

## 2023-07-02 DIAGNOSIS — D631 Anemia in chronic kidney disease: Secondary | ICD-10-CM

## 2023-07-02 DIAGNOSIS — F0390 Unspecified dementia without behavioral disturbance: Secondary | ICD-10-CM

## 2023-07-02 DIAGNOSIS — S81802D Unspecified open wound, left lower leg, subsequent encounter: Secondary | ICD-10-CM | POA: Diagnosis not present

## 2023-07-02 NOTE — Progress Notes (Signed)
 Report called to [facility] [phone]

## 2023-07-02 NOTE — Hospital Course (Addendum)
 Ms. Laura Wilcox is a 89 year old female with dementia, hypothyroidism, depression, anxiety, CKD stage IIIb, history of skin cancer, UTI, myelofibrosis, polycythemia rubra vera, chronic left foot wound after hematoma, chronic cough, recently discharged from [facility] with pneumonia who presented to the hospital with altered mental status.  On arrival to the ED O2 sat was in the 70s.  Patient was found to have an acute UTI as well as left foot wound infection.  She completed a 7-day course of antibiotics.  Given her persistent altered mentation, underlying dementia, and multiple chronic medical problems, family ultimately elected to proceed with hospice care at discharge and patient was transitioned to comfort measures only on 4/1.  She is discharging today directly to hospice facility. I have discussed her care directly with her daughter [name], at bedside.

## 2023-07-02 NOTE — Discharge Summary (Signed)
 Physician Discharge Summary   Patient: Laura Wilcox MRN: [id number] DOB: 24-Apr-1926  Admit date:     06/23/2023  Discharge date: 07/02/23  Discharge Physician: Debarah Crape   PCP: Smiley Houseman, NP   Recommendations at discharge:    Discharging to hospice  Discharge Diagnoses: Principal Problem:   Wound infection Active Problems:   Wound of left lower extremity   UTI (urinary tract infection)   Dementia without behavioral disturbance (HCC)   Acute metabolic encephalopathy   Acute respiratory failure with hypoxia (HCC)   Chronic kidney disease, stage 3b (HCC)   Hypothyroidism   Anemia in chronic kidney disease   Polycythemia rubra vera (HCC)   Myelofibrosis (HCC)   Depression with anxiety   Palliative care encounter   Malnutrition of moderate degree  Resolved Problems:   * No resolved hospital problems. [facility] Course: Ms. Laura Wilcox is a 89 year old female with dementia, hypothyroidism, depression, anxiety, CKD stage IIIb, history of skin cancer, UTI, myelofibrosis, polycythemia rubra vera, chronic left foot wound after hematoma, chronic cough, recently discharged from [facility] with pneumonia who presented to the hospital with altered mental status.  On arrival to the ED O2 sat was in the 70s.  Patient was found to have an acute UTI as well as left foot wound infection.  She completed a 7-day course of antibiotics.  Given her persistent altered mentation, underlying dementia, and multiple chronic medical problems, family ultimately elected to proceed with hospice care at discharge and patient was transitioned to comfort measures only on 4/1.  She is discharging today directly to hospice facility. I have discussed her care directly with her daughter [name], at bedside.        Left foot wound infection Left leg hematoma Acute UTI Urine culture grew Klebsiella pneumoniae and Providencia stuartii Completed 7 days of IV antibiotics on 06/29/2023 Staph  epidermidis isolated in 1 blood culture bottle is a contaminant  Acute metabolic encephalopathy on underlying dementia, delirium: Patient is now on comfort care   Acute hypoxic respiratory failure Improved.  She is tolerating room air   CKD stage IIIb Creatinine stable   Hypoglycemia Hypophosphatemia Acute on chronic anemia, anemia of chronic disease S/p transfusion 1 unit of PRBCs on 06/30/2023 for hemoglobin of 6.6.   Decubitus ulcers (present on admission) Bilateral heel deep tissue injury, stage II decubitus ulcers on the back   Comorbidities include polycythemia rubra vera, myelofibrosis (follows with Dr. Cathie Hoops, oncologist), protein calorie malnutrition, depression, anxiety,  hypothyroidism  Disposition: Hospice care Diet recommendation:  Discharge Diet Orders (From admission, onward)     Start     Ordered   07/02/23 0000  Diet general        07/02/23 1342           Regular diet DISCHARGE MEDICATION: Allergies as of 07/02/2023       Reactions   Bactrim [sulfamethoxazole-trimethoprim]    Due to kidney disease    Epinephrine Other (See Comments)   Other    Per daughter RUE BP significantly higher than LUE        Medication List     STOP taking these medications    acetaminophen 325 MG tablet Commonly known as: TYLENOL   ammonium lactate 12 % cream Commonly known as: AMLACTIN   ascorbic acid 500 MG tablet Commonly known as: VITAMIN C   aspirin EC 81 MG tablet   calcitRIOL 0.25 MCG capsule Commonly known as: ROCALTROL   citalopram 10 MG tablet Commonly known as:  CELEXA   clotrimazole-betamethasone cream Commonly known as: LOTRISONE   feeding supplement (PRO-STAT SUGAR FREE 64) Liqd   ferrous sulfate 325 (65 FE) MG EC tablet   hydrocortisone 1 % ointment   hydrocortisone 2.5 % cream   hydrOXYzine 25 MG tablet Commonly known as: ATARAX   Jakafi 5 MG tablet Generic drug: ruxolitinib phosphate   levothyroxine 25 MCG tablet Commonly known  as: SYNTHROID   multivitamin with minerals Tabs tablet   Narcan 4 MG/0.1ML Liqd nasal spray kit Generic drug: naloxone   oxyCODONE 5 MG immediate release tablet Commonly known as: Oxy IR/ROXICODONE   polyethylene glycol 17 g packet Commonly known as: MIRALAX / GLYCOLAX   traZODone 50 MG tablet Commonly known as: DESYREL   vitamin B-12 500 MCG tablet Commonly known as: CYANOCOBALAMIN   Vitamin D3 25 MCG (1000 UT) Caps        Discharge Exam: Filed Weights   06/27/23 0344 06/29/23 0500 06/30/23 0200  Weight: 25.4 kg 23.8 kg 23 kg   Constitutional: Obtunded, calm, unresponsive, appears comfortable HENT: Head Normocephalic and atraumatic.  Mucous membranes are moist.  Cardiovascular: Rate and Rhythm: Normal rate and regular rhythm.  Pulmonary: Non labored, symmetric rise of chest wall.  Skin: warm and dry. not jaundiced.  Bruises and lesions in various stages of healing  neurological: Patient is sleeping, unarousable   Condition at discharge: poor  The results of significant diagnostics from this hospitalization (including imaging, microbiology, ancillary and laboratory) are listed below for reference.   Imaging Studies: CT HEAD WO CONTRAST ( ) Result Date: 06/24/2023 CLINICAL DATA:  Altered mental status for 2 days EXAM: CT HEAD WITHOUT CONTRAST TECHNIQUE: Contiguous axial images were obtained from the base of the skull through the vertex without intravenous contrast. RADIATION DOSE REDUCTION: This exam was performed according to the departmental dose-optimization program which includes automated exposure control, adjustment of the mA and/or kV according to patient size and/or use of iterative reconstruction technique. COMPARISON:  03/07/2023 FINDINGS: Brain: No evidence of acute infarction, hemorrhage, hydrocephalus, extra-axial collection or mass lesion/mass effect. Mild atrophic changes and chronic white matter ischemic changes are noted. Vascular: No hyperdense vessel or  unexpected calcification. Skull: Normal. Negative for fracture or focal lesion. Sinuses/Orbits: No acute finding. Other: None. IMPRESSION: Atrophic and ischemic changes stable from the prior exam. Electronically Signed   By: Alcide Clever M.D.   On: 06/24/2023 03:01   CT Angio Chest Pulmonary Embolism (PE) W or WO Contrast Result Date: 06/24/2023 CLINICAL DATA:  Pulmonary embolus suspected with high probability. Altered mental status. Recent discharge with pneumonia. EXAM: CT ANGIOGRAPHY CHEST WITH CONTRAST TECHNIQUE: Multidetector CT imaging of the chest was performed using the standard protocol during bolus administration of intravenous contrast. Multiplanar CT image reconstructions and MIPs were obtained to evaluate the vascular anatomy. RADIATION DOSE REDUCTION: This exam was performed according to the departmental dose-optimization program which includes automated exposure control, adjustment of the mA and/or kV according to patient size and/or use of iterative reconstruction technique. CONTRAST:  75mL OMNIPAQUE IOHEXOL 350 MG/ML SOLN COMPARISON:  Chest radiograph 06/23/2023 and 04/27/2023 FINDINGS: Cardiovascular: Technically adequate study with good opacification of the central and segmental pulmonary arteries. Mild motion artifact. No focal filling defects in the pulmonary arteries. No evidence of significant pulmonary embolus. Normal heart size. No pericardial effusions. Calcification of the aorta and coronary arteries. No aortic aneurysm or dissection. Mediastinum/Nodes: Small esophageal hiatal hernia. Esophagus is decompressed. No significant lymphadenopathy. Thyroid gland is unremarkable. Lungs/Pleura: Emphysematous changes throughout the lungs.  Scarring in the apices and bases. No airspace disease or consolidation is seen. No pleural effusion or pneumothorax. Upper Abdomen: No acute abnormalities. Musculoskeletal: Degenerative changes in the spine. No acute bony abnormalities. Review of the MIP images  confirms the above findings. IMPRESSION: 1. No evidence of significant pulmonary embolus. 2. Prominent emphysematous changes in the lungs. No evidence of active pulmonary disease. 3. Aortic atherosclerosis. Electronically Signed   By: Burman Nieves M.D.   On: 06/24/2023 03:00   DG Chest Port 1 View Result Date: 06/23/2023 CLINICAL DATA:  Possible sepsis EXAM: PORTABLE CHEST 1 VIEW COMPARISON:  04/27/2023 FINDINGS: Cardiac shadow is stable. Previously seen hiatal hernia is again identified and stable. Lungs are well aerated bilaterally. No focal infiltrate or effusion is seen. No acute bony abnormality is noted. Old healed rib fractures are noted on the right. IMPRESSION: Hiatal hernia.  No acute abnormality noted Electronically Signed   By: Alcide Clever M.D.   On: 06/23/2023 20:21   VAS [place] ABI WITH/WO TBI Result Date: 06/04/2023  LOWER EXTREMITY DOPPLER STUDY Patient Name:  JISELL MAJER  Date of Exam:   06/03/2023 Medical Rec #: [id number]         Accession #:    [id number] Date of Birth: 1927-01-30         Patient Gender: F Patient Age:   62 years Exam Location:  [facility] Procedure:      VAS [place] ABI WITH/WO TBI Referring Phys: Coral Else --------------------------------------------------------------------------------  Other Factors: Dementia.  Vascular Interventions: 04/15/23 - left superficial femoral artery - popliteal                         artery stent. Limitations: Today's exam was limited due to patient positioning, patient              intolerant to cuff pressure, involuntary patient movement and              bandages. Performing Technologist: [place] S[employer] Sturdivant-Jones RDMS, RVT  Examination Guidelines: A complete evaluation includes at minimum, Doppler waveform signals and systolic blood pressure reading at the level of bilateral brachial, anterior tibial, and posterior tibial arteries, when vessel segments are accessible. Bilateral testing is considered an integral part of a complete  examination. Photoelectric Plethysmograph (PPG) waveforms and toe systolic pressure readings are included as required and additional duplex testing as needed. Limited examinations for reoccurring indications may be performed as noted.  ABI Findings: +---------+------------------+-----+-----------------+--------+ Right    Rt Pressure (mmHg)IndexWaveform         Comment  +---------+------------------+-----+-----------------+--------+ Brachial 128                    triphasic                 +---------+------------------+-----+-----------------+--------+ PTA      56                0.44 monophasic                +---------+------------------+-----+-----------------+--------+ DP       87                0.68 monophasic                +---------+------------------+-----+-----------------+--------+ Great Toe                       severely dampened         +---------+------------------+-----+-----------------+--------+ +---------+------------------+-----+-----------------+--------------------+  Left     Lt Pressure (mmHg)IndexWaveform         Comment              +---------+------------------+-----+-----------------+--------------------+ Brachial 78                                      greater than 20 mmHg +---------+------------------+-----+-----------------+--------------------+ PTA      79                0.62 monophasic                            +---------+------------------+-----+-----------------+--------------------+ DP                                               Not obtainable       +---------+------------------+-----+-----------------+--------------------+ Great Toe                       severely dampened                     +---------+------------------+-----+-----------------+--------------------+ +-------+-----------+-----------+------------+------------+ ABI/TBIToday's ABIToday's TBIPrevious ABIPrevious TBI  +-------+-----------+-----------+------------+------------+ Right  0.68                                           +-------+-----------+-----------+------------+------------+ Left   0.62                                           +-------+-----------+-----------+------------+------------+  Suboptimal study due to patient's condition.  Summary: Right: Resting right ankle-brachial index indicates moderate right lower extremity arterial disease. Left: Resting left ankle-brachial index indicates moderate left lower extremity arterial disease. *See table(s) above for measurements and observations.  Electronically signed by Sherald Hess MD on 06/04/2023 at 7:26:30 AM.    Final    VAS [place] LOWER EXTREMITY ARTERIAL DUPLEX Result Date: 06/04/2023 LOWER EXTREMITY ARTERIAL DUPLEX STUDY Patient Name:  TANITA PALINKAS  Date of Exam:   06/03/2023 Medical Rec #: [id number]         Accession #:    [id number] Date of Birth: September 29, 1926         Patient Gender: F Patient Age:   14 years Exam Location:  [facility] Procedure:      VAS [place] LOWER EXTREMITY ARTERIAL DUPLEX Referring Phys: Coral Else --------------------------------------------------------------------------------  Indications: Peripheral artery disease.  Vascular Interventions: Left SFA-PopA stenting on 04/15/23. Current ABI:            Rt 0.68, Lt 0.62 Limitations: Patient positioning, intolerant to cuff pressure, involuntary              patient movement and bandages. Performing Technologist: [place] Sink Sturdivant-Jones RDMS, RVT  Examination Guidelines: A complete evaluation includes B-mode imaging, spectral Doppler, color Doppler, and power Doppler as needed of all accessible portions of each vessel. Bilateral testing is considered an integral part of a complete examination. Limited examinations for reoccurring indications may be performed as noted.   +----------+--------+-----+--------+----------+------------------+ LEFT      PSV  cm/sRatioStenosisWaveform  Comments           +----------+--------+-----+--------+----------+------------------+  CFA Distal137                  biphasic                     +----------+--------+-----+--------+----------+------------------+ DFA       51                   monophasic                   +----------+--------+-----+--------+----------+------------------+ SFA Prox                                 Stent              +----------+--------+-----+--------+----------+------------------+ SFA Mid                                  Stent              +----------+--------+-----+--------+----------+------------------+ SFA Distal                               Stent              +----------+--------+-----+--------+----------+------------------+ POP Prox  78                   monophasic                   +----------+--------+-----+--------+----------+------------------+ POP Distal40                   monophasic                   +----------+--------+-----+--------+----------+------------------+ TP Trunk  62                   monophasic                   +----------+--------+-----+--------+----------+------------------+ ATA Prox                                 Not imaged/bandage +----------+--------+-----+--------+----------+------------------+ ATA Mid   58                   monophasic                   +----------+--------+-----+--------+----------+------------------+ ATA Distal22                   monophasic                   +----------+--------+-----+--------+----------+------------------+ PTA Distal39                   monophasic                   +----------+--------+-----+--------+----------+------------------+ PERO Prox                                Not visualized     +----------+--------+-----+--------+----------+------------------+  Left Stent(s): +---------------+--------+--------+----------+--------+ SFA - POP A     PSV cm/sStenosisWaveform  Comments +---------------+--------+--------+----------+--------+ Prox to Stent  101                                +---------------+--------+--------+----------+--------+  Proximal Stent 60              monophasic         +---------------+--------+--------+----------+--------+ Mid Stent      58              monophasic         +---------------+--------+--------+----------+--------+ Distal Stent   61              monophasic         +---------------+--------+--------+----------+--------+ Distal to Stent37              monophasic         +---------------+--------+--------+----------+--------+    Summary: Left: Patent left superficial femoral-popliteal artery stent.   See table(s) above for measurements and observations. Electronically signed by Sherald Hess MD on 06/04/2023 at 7:25:12 AM.    Final     Microbiology: Results for orders placed or performed during the hospital encounter of 06/23/23  Blood Culture (routine x 2)     Status: Abnormal   Collection Time: 06/23/23  5:36 PM   Specimen: BLOOD  Result Value Ref Range Status   Specimen Description   Final    BLOOD BLOOD LEFT FOREARM Performed at [facility], [address], [place], [place] [address]    Special Requests   Final    BOTTLES DRAWN AEROBIC AND ANAEROBIC Blood Culture results may not be optimal due to an inadequate volume of blood received in culture bottles Performed at [facility], [address], [place], [place] [address]    Culture  Setup Time   Final    GRAM POSITIVE COCCI AEROBIC BOTTLE ONLY Organism ID to follow CRITICAL RESULT CALLED TO, READ BACK BY AND VERIFIED WITH: A. DOBBS 2006 06/24/23 LRL Performed at [facility], [address] [place], [place] [address]    Culture (A)  Final    STAPHYLOCOCCUS EPIDERMIDIS THE SIGNIFICANCE OF ISOLATING THIS ORGANISM FROM A SINGLE SET OF BLOOD CULTURES WHEN MULTIPLE SETS ARE DRAWN IS  UNCERTAIN. PLEASE NOTIFY THE MICROBIOLOGY DEPARTMENT WITHIN ONE WEEK IF SPECIATION AND SENSITIVITIES ARE REQUIRED. Performed at [facility], [address], [place], [place] [address]    Report Status 06/26/2023 FINAL  Final  Blood Culture ID Panel (Reflexed)     Status: Abnormal   Collection Time: 06/23/23  5:36 PM  Result Value Ref Range Status   Enterococcus faecalis NOT DETECTED NOT DETECTED Final   Enterococcus Faecium NOT DETECTED NOT DETECTED Final   Listeria monocytogenes NOT DETECTED NOT DETECTED Final   Staphylococcus species DETECTED (A) NOT DETECTED Final    Comment: CRITICAL RESULT CALLED TO, READ BACK BY AND VERIFIED WITH: A. DOBBS 2006 06/24/23 LRL    Staphylococcus aureus (BCID) NOT DETECTED NOT DETECTED Final   Staphylococcus epidermidis DETECTED (A) NOT DETECTED Final    Comment: Methicillin (oxacillin) resistant coagulase negative staphylococcus. Possible blood culture contaminant (unless isolated from more than one blood culture draw or clinical case suggests pathogenicity). No antibiotic treatment is indicated for blood  culture contaminants. CRITICAL RESULT CALLED TO, READ BACK BY AND VERIFIED WITH: A. DOBBS 2006 06/24/23 LRL    Staphylococcus lugdunensis NOT DETECTED NOT DETECTED Final   Streptococcus species NOT DETECTED NOT DETECTED Final   Streptococcus agalactiae NOT DETECTED NOT DETECTED Final   Streptococcus pneumoniae NOT DETECTED NOT DETECTED Final   Streptococcus pyogenes NOT DETECTED NOT DETECTED Final   A.calcoaceticus-baumannii NOT DETECTED NOT DETECTED Final   Bacteroides fragilis NOT DETECTED NOT DETECTED  Final   Enterobacterales NOT DETECTED NOT DETECTED Final   Enterobacter cloacae complex NOT DETECTED NOT DETECTED Final   Escherichia coli NOT DETECTED NOT DETECTED Final   Klebsiella aerogenes NOT DETECTED NOT DETECTED Final   Klebsiella oxytoca NOT DETECTED NOT DETECTED Final   Klebsiella pneumoniae NOT DETECTED NOT DETECTED Final    Proteus species NOT DETECTED NOT DETECTED Final   Salmonella species NOT DETECTED NOT DETECTED Final   Serratia marcescens NOT DETECTED NOT DETECTED Final   Haemophilus influenzae NOT DETECTED NOT DETECTED Final   Neisseria meningitidis NOT DETECTED NOT DETECTED Final   Pseudomonas aeruginosa NOT DETECTED NOT DETECTED Final   Stenotrophomonas maltophilia NOT DETECTED NOT DETECTED Final   Candida albicans NOT DETECTED NOT DETECTED Final   Candida auris NOT DETECTED NOT DETECTED Final   Candida glabrata NOT DETECTED NOT DETECTED Final   Candida krusei NOT DETECTED NOT DETECTED Final   Candida parapsilosis NOT DETECTED NOT DETECTED Final   Candida tropicalis NOT DETECTED NOT DETECTED Final   Cryptococcus neoformans/gattii NOT DETECTED NOT DETECTED Final   Methicillin resistance mecA/C DETECTED (A) NOT DETECTED Final    Comment: CRITICAL RESULT CALLED TO, READ BACK BY AND VERIFIED WITH: A. DOBBS 2006 06/24/23 LRL Performed at [name], [address] [place], [place] [address]   Blood Culture (routine x 2)     Status: None   Collection Time: 06/23/23  5:48 PM   Specimen: BLOOD  Result Value Ref Range Status   Specimen Description BLOOD BLOOD RIGHT FOREARM  Final   Special Requests   Final    BOTTLES DRAWN AEROBIC AND ANAEROBIC Blood Culture results may not be optimal due to an inadequate volume of blood received in culture bottles   Culture   Final    NO GROWTH 5 DAYS Performed at [facility], [address] [place], [place] [address]    Report Status 06/28/2023 FINAL  Final  Resp panel by RT-PCR (RSV, Flu A&B, Covid) Anterior Nasal Swab     Status: None   Collection Time: 06/23/23  8:45 PM   Specimen: Anterior Nasal Swab  Result Value Ref Range Status   SARS Coronavirus 2 by RT PCR NEGATIVE NEGATIVE Final    Comment: (NOTE) SARS-CoV-2 target nucleic acids are NOT DETECTED.  The SARS-CoV-2 RNA is generally detectable in upper respiratory specimens during  the acute phase of infection. The lowest concentration of SARS-CoV-2 viral copies this assay can detect is 138 copies/mL. A negative result does not preclude SARS-Cov-2 infection and should not be used as the sole basis for treatment or other patient management decisions. A negative result may occur with  improper specimen collection/handling, submission of specimen other than nasopharyngeal swab, presence of viral mutation(s) within the areas targeted by this assay, and inadequate number of viral copies(<138 copies/mL). A negative result must be combined with clinical observations, patient history, and epidemiological information. The expected result is Negative.  Fact Sheet for Patients:  BloggerCourse.com  Fact Sheet for Healthcare Providers:  SeriousBroker.it  This test is no t yet approved or cleared by the [place] FDA and  has been authorized for detection and/or diagnosis of SARS-CoV-2 by FDA under an Emergency Use Authorization (EUA). This EUA will remain  in effect (meaning this test can be used) for the duration of the COVID-19 declaration under Section 564(b)(1) of the Act, 21 U.S.C.section 360bbb-3(b)(1), unless the authorization is terminated  or revoked sooner.       Influenza A by PCR NEGATIVE  NEGATIVE Final   Influenza B by PCR NEGATIVE NEGATIVE Final    Comment: (NOTE) The Xpert Xpress SARS-CoV-2/FLU/RSV plus assay is intended as an aid in the diagnosis of influenza from Nasopharyngeal swab specimens and should not be used as a sole basis for treatment. Nasal washings and aspirates are unacceptable for Xpert Xpress SARS-CoV-2/FLU/RSV testing.  Fact Sheet for Patients: BloggerCourse.com  Fact Sheet for Healthcare Providers: SeriousBroker.it  This test is not yet approved or cleared by the [place] FDA and has been authorized for detection and/or  diagnosis of SARS-CoV-2 by FDA under an Emergency Use Authorization (EUA). This EUA will remain in effect (meaning this test can be used) for the duration of the COVID-19 declaration under Section 564(b)(1) of the Act, 21 U.S.C. section 360bbb-3(b)(1), unless the authorization is terminated or revoked.     Resp Syncytial Virus by PCR NEGATIVE NEGATIVE Final    Comment: (NOTE) Fact Sheet for Patients: BloggerCourse.com  Fact Sheet for Healthcare Providers: SeriousBroker.it  This test is not yet approved or cleared by the [place] FDA and has been authorized for detection and/or diagnosis of SARS-CoV-2 by FDA under an Emergency Use Authorization (EUA). This EUA will remain in effect (meaning this test can be used) for the duration of the COVID-19 declaration under Section 564(b)(1) of the Act, 21 U.S.C. section 360bbb-3(b)(1), unless the authorization is terminated or revoked.  Performed at [facility], [address] [place], [place] [address]   Urine Culture     Status: Abnormal   Collection Time: 06/23/23  9:23 PM   Specimen: Urine, Random  Result Value Ref Range Status   Specimen Description   Final    URINE, RANDOM Performed at [facility], [address] [place], [place] [address]    Special Requests   Final    NONE Reflexed from [id number] Performed at [facility], [address] [place], [place] [address]    Culture (A)  Final    >=100,000 COLONIES/mL KLEBSIELLA PNEUMONIAE >=100,000 COLONIES/mL PROVIDENCIA STUARTII    Report Status 06/26/2023 FINAL  Final   Organism ID, Bacteria KLEBSIELLA PNEUMONIAE (A)  Final   Organism ID, Bacteria PROVIDENCIA STUARTII (A)  Final      Susceptibility   Klebsiella pneumoniae - MIC*    AMPICILLIN >=32 RESISTANT Resistant     CEFAZOLIN <=4 SENSITIVE Sensitive     CEFEPIME <=0.12 SENSITIVE Sensitive     CEFTRIAXONE <=0.25 SENSITIVE Sensitive      CIPROFLOXACIN <=0.25 SENSITIVE Sensitive     GENTAMICIN <=1 SENSITIVE Sensitive     IMIPENEM <=0.25 SENSITIVE Sensitive     NITROFURANTOIN 32 SENSITIVE Sensitive     TRIMETH/SULFA <=20 SENSITIVE Sensitive     AMPICILLIN/SULBACTAM 8 SENSITIVE Sensitive     PIP/TAZO 16 SENSITIVE Sensitive ug/mL    * >=100,000 COLONIES/mL KLEBSIELLA PNEUMONIAE   Providencia stuartii - MIC*    AMPICILLIN >=32 RESISTANT Resistant     CEFEPIME <=0.12 SENSITIVE Sensitive     CEFTRIAXONE 0.5 SENSITIVE Sensitive     CIPROFLOXACIN 2 RESISTANT Resistant     GENTAMICIN 4 SENSITIVE Sensitive     IMIPENEM 2 SENSITIVE Sensitive     NITROFURANTOIN 256 RESISTANT Resistant     TRIMETH/SULFA >=320 RESISTANT Resistant     AMPICILLIN/SULBACTAM >=32 RESISTANT Resistant     PIP/TAZO 8 SENSITIVE Sensitive ug/mL    * >=100,000 COLONIES/mL PROVIDENCIA STUARTII  Respiratory (~20 pathogens) panel by PCR     Status: None   Collection Time: 06/24/23  4:57  AM   Specimen: Nasopharyngeal Swab; Respiratory  Result Value Ref Range Status   Adenovirus NOT DETECTED NOT DETECTED Final   Coronavirus 229E NOT DETECTED NOT DETECTED Final    Comment: (NOTE) The Coronavirus on the Respiratory Panel, DOES NOT test for the novel  Coronavirus (2019 nCoV)    Coronavirus HKU1 NOT DETECTED NOT DETECTED Final   Coronavirus NL63 NOT DETECTED NOT DETECTED Final   Coronavirus OC43 NOT DETECTED NOT DETECTED Final   Metapneumovirus NOT DETECTED NOT DETECTED Final   Rhinovirus / Enterovirus NOT DETECTED NOT DETECTED Final   Influenza A NOT DETECTED NOT DETECTED Final   Influenza B NOT DETECTED NOT DETECTED Final   Parainfluenza Virus 1 NOT DETECTED NOT DETECTED Final   Parainfluenza Virus 2 NOT DETECTED NOT DETECTED Final   Parainfluenza Virus 3 NOT DETECTED NOT DETECTED Final   Parainfluenza Virus 4 NOT DETECTED NOT DETECTED Final   Respiratory Syncytial Virus NOT DETECTED NOT DETECTED Final   Bordetella pertussis NOT DETECTED NOT DETECTED  Final   Bordetella Parapertussis NOT DETECTED NOT DETECTED Final   Chlamydophila pneumoniae NOT DETECTED NOT DETECTED Final   Mycoplasma pneumoniae NOT DETECTED NOT DETECTED Final    Comment: Performed at [facility], [address], [place], [place] [address]   *Note: Due to a large number of results and/or encounters for the requested time period, some results have not been displayed. A complete set of results can be found in Results Review.    Labs: CBC: Recent Labs  Lab 06/26/23 0503 06/29/23 0700 06/30/23 0535 06/30/23 1625 07/01/23 0503  WBC 10.4 16.9* 16.4*  --  15.4*  NEUTROABS 7.2  --  10.4*  --  9.8*  HGB 7.6* 8.7* 6.6* 8.9* 7.5*  HCT 25.5* 29.2* 21.5* 29.0* 23.2*  MCV 86.4 86.4 86.3  --  84.4  PLT 764* 788* 646*  --  598*   Basic Metabolic Panel: Recent Labs  Lab 06/26/23 0503 06/27/23 0517 06/28/23 0522 06/29/23 0700 07/01/23 0503  NA 136  --   --  135 138  K 3.9  --   --  4.1 4.5  CL 106  --   --  100 103  CO2 21*  --   --  24 28  GLUCOSE 85  --   --  84 79  BUN 21  --   --  16 20  CREATININE 0.90  --   --  0.65 0.84  CALCIUM 8.7*  --   --  9.1 8.9  MG 1.7 1.7 1.7  --   --   PHOS 2.4* 2.0* 2.6 2.1* 3.6   Liver Function Tests: Recent Labs  Lab 06/29/23 0700 07/01/23 0503  ALBUMIN 2.7* 2.4*   CBG: No results for input(s): "GLUCAP" in the last 168 hours.  Discharge time spent: 31 minutes.  Signed: Debarah Crape, DO [facility] 07/02/2023

## 2023-07-02 NOTE — Plan of Care (Signed)

## 2023-07-08 ENCOUNTER — Telehealth: Payer: Self-pay | Admitting: Oncology

## 2023-07-08 ENCOUNTER — Other Ambulatory Visit: Payer: Self-pay

## 2023-07-08 NOTE — Telephone Encounter (Signed)
 Pt passed away April 5th 2025

## 2023-07-08 NOTE — Progress Notes (Signed)
 Patient deceased. Disenrolling.

## 2023-07-15 ENCOUNTER — Other Ambulatory Visit: Payer: PRIVATE HEALTH INSURANCE

## 2023-07-15 ENCOUNTER — Ambulatory Visit: Payer: PRIVATE HEALTH INSURANCE | Admitting: Oncology

## 2023-07-31 DEATH — deceased

## 2023-08-01 ENCOUNTER — Telehealth: Payer: PRIVATE HEALTH INSURANCE | Admitting: Hospice and Palliative Medicine
